# Patient Record
Sex: Female | Born: 1935
Health system: Southern US, Community
[De-identification: ages and names within clinical notes are randomized; demographics above are authoritative.]

## PROBLEM LIST (undated history)

## (undated) ENCOUNTER — Emergency Department: Admission: EM | Discharge status: Absent without leave | Payer: Self-pay

## (undated) DIAGNOSIS — M179 Osteoarthritis of knee, unspecified: Secondary | ICD-10-CM

## (undated) DIAGNOSIS — N390 Urinary tract infection, site not specified: Secondary | ICD-10-CM

## (undated) DIAGNOSIS — K219 Gastro-esophageal reflux disease without esophagitis: Secondary | ICD-10-CM

## (undated) DIAGNOSIS — Z9981 Dependence on supplemental oxygen: Secondary | ICD-10-CM

## (undated) DIAGNOSIS — G43909 Migraine, unspecified, not intractable, without status migrainosus: Secondary | ICD-10-CM

## (undated) DIAGNOSIS — M1710 Unilateral primary osteoarthritis, unspecified knee: Secondary | ICD-10-CM

## (undated) DIAGNOSIS — R0902 Hypoxemia: Secondary | ICD-10-CM

## (undated) DIAGNOSIS — R4182 Altered mental status, unspecified: Secondary | ICD-10-CM

## (undated) DIAGNOSIS — N3941 Urge incontinence: Secondary | ICD-10-CM

## (undated) DIAGNOSIS — M129 Arthropathy, unspecified: Secondary | ICD-10-CM

## (undated) DIAGNOSIS — G4733 Obstructive sleep apnea (adult) (pediatric): Secondary | ICD-10-CM

## (undated) DIAGNOSIS — F3289 Other specified depressive episodes: Secondary | ICD-10-CM

## (undated) DIAGNOSIS — E785 Hyperlipidemia, unspecified: Secondary | ICD-10-CM

## (undated) DIAGNOSIS — F329 Major depressive disorder, single episode, unspecified: Secondary | ICD-10-CM

## (undated) DIAGNOSIS — E039 Hypothyroidism, unspecified: Secondary | ICD-10-CM

## (undated) DIAGNOSIS — I509 Heart failure, unspecified: Secondary | ICD-10-CM

## (undated) DIAGNOSIS — C50919 Malignant neoplasm of unspecified site of unspecified female breast: Secondary | ICD-10-CM

## (undated) DIAGNOSIS — I10 Essential (primary) hypertension: Secondary | ICD-10-CM

## (undated) HISTORY — PX: THYROIDECTOMY: SHX17

## (undated) HISTORY — DX: Osteoarthritis of knee, unspecified: M17.9

## (undated) HISTORY — DX: Obstructive sleep apnea (adult) (pediatric): G47.33

## (undated) HISTORY — PX: TONSILLECTOMY: SUR1361

## (undated) HISTORY — PX: ABDOMINAL HYSTERECTOMY: SHX81

## (undated) HISTORY — PX: OTHER SURGICAL HISTORY: SHX169

## (undated) HISTORY — PX: ADENOIDECTOMY: SUR15

## (undated) HISTORY — DX: Other specified depressive episodes: F32.89

## (undated) HISTORY — DX: Urge incontinence: N39.41

## (undated) HISTORY — DX: Malignant neoplasm of unspecified site of unspecified female breast: C50.919

## (undated) HISTORY — DX: Gastro-esophageal reflux disease without esophagitis: K21.9

## (undated) HISTORY — PX: PARATHYROIDECTOMY: SHX19

## (undated) HISTORY — DX: Major depressive disorder, single episode, unspecified: F32.9

## (undated) HISTORY — DX: Hypothyroidism, unspecified: E03.9

## (undated) HISTORY — DX: Unilateral primary osteoarthritis, unspecified knee: M17.10

## (undated) HISTORY — DX: Migraine, unspecified, not intractable, without status migrainosus: G43.909

## (undated) HISTORY — DX: Altered mental status, unspecified: R41.82

## (undated) HISTORY — DX: Morbid (severe) obesity due to excess calories: E66.01

## (undated) HISTORY — PX: TUBAL LIGATION: SHX77

## (undated) HISTORY — PX: REPLACEMENT TOTAL KNEE BILATERAL: SUR1225

## (undated) HISTORY — DX: Arthropathy, unspecified: M12.9

## (undated) HISTORY — DX: Essential (primary) hypertension: I10

## (undated) HISTORY — DX: Hyperlipidemia, unspecified: E78.5

## (undated) HISTORY — PX: CATARACT EXTRACTION: SUR2

---

## 1993-06-19 HISTORY — PX: CHOLECYSTECTOMY: SHX55

## 1998-06-19 DIAGNOSIS — C50919 Malignant neoplasm of unspecified site of unspecified female breast: Secondary | ICD-10-CM

## 1998-06-19 HISTORY — PX: BREAST LUMPECTOMY: SHX2

## 1998-06-19 HISTORY — DX: Malignant neoplasm of unspecified site of unspecified female breast: C50.919

## 1998-06-19 HISTORY — PX: BREAST BIOPSY: SHX20

## 2006-12-22 ENCOUNTER — Emergency Department (HOSPITAL_COMMUNITY): Admission: EM | Admit: 2006-12-22 | Discharge: 2006-12-22 | Payer: Self-pay | Admitting: Family Medicine

## 2007-08-16 ENCOUNTER — Encounter: Payer: Self-pay | Admitting: Internal Medicine

## 2007-08-20 ENCOUNTER — Encounter: Payer: Self-pay | Admitting: Internal Medicine

## 2007-09-24 ENCOUNTER — Encounter: Payer: Self-pay | Admitting: Internal Medicine

## 2007-10-08 ENCOUNTER — Encounter: Payer: Self-pay | Admitting: Internal Medicine

## 2007-11-07 ENCOUNTER — Encounter: Payer: Self-pay | Admitting: Internal Medicine

## 2008-07-02 ENCOUNTER — Encounter: Payer: Self-pay | Admitting: Internal Medicine

## 2008-07-02 LAB — CONVERTED CEMR LAB
BUN: 18 mg/dL
Calcium: 8.8 mg/dL
Creatinine, Ser: 0.9 mg/dL
Glucose, Bld: 126 mg/dL
Potassium: 3.7 meq/L
Sodium: 140 meq/L

## 2008-11-06 ENCOUNTER — Encounter: Payer: Self-pay | Admitting: Internal Medicine

## 2008-11-06 LAB — CONVERTED CEMR LAB
Cholesterol: 119 mg/dL
Triglyceride fasting, serum: 75 mg/dL

## 2009-03-26 ENCOUNTER — Encounter: Payer: Self-pay | Admitting: Internal Medicine

## 2009-03-26 LAB — CONVERTED CEMR LAB
ALT: 25 units/L
AST: 33 units/L
Albumin: 3.7 g/dL
Alkaline Phosphatase: 105 units/L
BUN: 16 mg/dL
CO2: 32 meq/L
Calcium: 8.7 mg/dL
Chloride: 100 meq/L
Cholesterol: 99 mg/dL
Creatinine, Ser: 0.8 mg/dL
Glucose, Bld: 105 mg/dL
HCT: 41.9 %
HDL: 44 mg/dL
Hemoglobin: 14.5 g/dL
LDL Cholesterol: 38 mg/dL
MCV: 90.3 fL
Platelets: 150 10*3/uL
Potassium: 3.9 meq/L
RBC: 4.64 M/uL
RDW: 13.7 %
Sodium: 139 meq/L
Total Bilirubin: 1 mg/dL
Total Protein: 6.4 g/dL
Triglyceride fasting, serum: 87 mg/dL
WBC: 5.6 10*3/uL

## 2009-03-27 ENCOUNTER — Encounter: Payer: Self-pay | Admitting: Internal Medicine

## 2009-03-27 LAB — CONVERTED CEMR LAB: TSH: 2.86 microintl units/mL

## 2009-07-27 ENCOUNTER — Encounter: Payer: Self-pay | Admitting: Internal Medicine

## 2009-07-27 LAB — CONVERTED CEMR LAB
Albumin: 4 g/dL
Alkaline Phosphatase: 120 units/L
BUN: 20 mg/dL
Calcium: 8.9 mg/dL
Cholesterol: 107 mg/dL
Creatinine, Ser: 0.9 mg/dL
Glucose, Bld: 105 mg/dL
LDL Cholesterol: 43 mg/dL
Lymphocytes, automated: 25.6 %
MCV: 93.6 fL
Neutrophils Relative %: 62.5 %
Platelets: 166 10*3/uL
Potassium: 4 meq/L
RBC: 4.73 M/uL
Sodium: 140 meq/L
Total Protein: 7.4 g/dL
WBC: 6.1 10*3/uL

## 2009-07-28 ENCOUNTER — Encounter: Payer: Self-pay | Admitting: Internal Medicine

## 2009-07-28 LAB — CONVERTED CEMR LAB: TSH: 7.85 microintl units/mL

## 2009-08-05 ENCOUNTER — Encounter: Payer: Self-pay | Admitting: Internal Medicine

## 2009-08-05 DIAGNOSIS — I10 Essential (primary) hypertension: Secondary | ICD-10-CM | POA: Insufficient documentation

## 2009-08-05 DIAGNOSIS — Z853 Personal history of malignant neoplasm of breast: Secondary | ICD-10-CM | POA: Insufficient documentation

## 2009-08-05 DIAGNOSIS — Z862 Personal history of diseases of the blood and blood-forming organs and certain disorders involving the immune mechanism: Secondary | ICD-10-CM

## 2009-08-05 DIAGNOSIS — E039 Hypothyroidism, unspecified: Secondary | ICD-10-CM | POA: Insufficient documentation

## 2009-08-05 DIAGNOSIS — Z8639 Personal history of other endocrine, nutritional and metabolic disease: Secondary | ICD-10-CM | POA: Insufficient documentation

## 2009-10-04 ENCOUNTER — Ambulatory Visit: Payer: Self-pay | Admitting: Internal Medicine

## 2009-10-04 DIAGNOSIS — R32 Unspecified urinary incontinence: Secondary | ICD-10-CM | POA: Insufficient documentation

## 2009-10-04 DIAGNOSIS — Z9189 Other specified personal risk factors, not elsewhere classified: Secondary | ICD-10-CM | POA: Insufficient documentation

## 2009-10-04 DIAGNOSIS — S7290XA Unspecified fracture of unspecified femur, initial encounter for closed fracture: Secondary | ICD-10-CM | POA: Insufficient documentation

## 2009-10-26 ENCOUNTER — Telehealth: Payer: Self-pay | Admitting: Internal Medicine

## 2009-11-09 ENCOUNTER — Telehealth: Payer: Self-pay | Admitting: Internal Medicine

## 2009-12-22 ENCOUNTER — Telehealth: Payer: Self-pay | Admitting: Internal Medicine

## 2010-01-18 ENCOUNTER — Telehealth: Payer: Self-pay | Admitting: Internal Medicine

## 2010-02-08 ENCOUNTER — Ambulatory Visit: Payer: Self-pay | Admitting: Internal Medicine

## 2010-02-08 DIAGNOSIS — R5383 Other fatigue: Secondary | ICD-10-CM

## 2010-02-08 DIAGNOSIS — R5381 Other malaise: Secondary | ICD-10-CM | POA: Insufficient documentation

## 2010-02-09 LAB — CONVERTED CEMR LAB
Basophils Absolute: 0 10*3/uL (ref 0.0–0.1)
Basophils Relative: 0.2 % (ref 0.0–3.0)
Eosinophils Absolute: 0.2 10*3/uL (ref 0.0–0.7)
Lymphocytes Relative: 23.8 % (ref 12.0–46.0)
MCHC: 34.3 g/dL (ref 30.0–36.0)
MCV: 92.9 fL (ref 78.0–100.0)
Monocytes Absolute: 0.6 10*3/uL (ref 0.1–1.0)
Neutro Abs: 5.2 10*3/uL (ref 1.4–7.7)
Neutrophils Relative %: 66.2 % (ref 43.0–77.0)
Potassium: 3.8 meq/L (ref 3.5–5.1)
RBC: 4.46 M/uL (ref 3.87–5.11)
RDW: 13.7 % (ref 11.5–14.6)

## 2010-03-04 ENCOUNTER — Ambulatory Visit: Payer: Self-pay | Admitting: Internal Medicine

## 2010-03-05 DIAGNOSIS — R059 Cough, unspecified: Secondary | ICD-10-CM | POA: Insufficient documentation

## 2010-03-05 DIAGNOSIS — R05 Cough: Secondary | ICD-10-CM

## 2010-03-19 ENCOUNTER — Ambulatory Visit: Payer: Self-pay | Admitting: Internal Medicine

## 2010-03-20 ENCOUNTER — Inpatient Hospital Stay (HOSPITAL_COMMUNITY): Admission: EM | Admit: 2010-03-20 | Discharge: 2010-03-22 | Payer: Self-pay | Admitting: Emergency Medicine

## 2010-03-23 ENCOUNTER — Telehealth: Payer: Self-pay | Admitting: Cardiovascular Disease

## 2010-05-06 ENCOUNTER — Emergency Department (HOSPITAL_COMMUNITY): Admission: EM | Admit: 2010-05-06 | Discharge: 2010-05-07 | Payer: Self-pay | Admitting: Emergency Medicine

## 2010-05-10 ENCOUNTER — Encounter (INDEPENDENT_AMBULATORY_CARE_PROVIDER_SITE_OTHER): Payer: Self-pay | Admitting: *Deleted

## 2010-05-10 ENCOUNTER — Ambulatory Visit: Payer: Self-pay | Admitting: Internal Medicine

## 2010-05-10 ENCOUNTER — Encounter: Payer: Self-pay | Admitting: Gastroenterology

## 2010-05-10 DIAGNOSIS — R110 Nausea: Secondary | ICD-10-CM | POA: Insufficient documentation

## 2010-05-10 DIAGNOSIS — F341 Dysthymic disorder: Secondary | ICD-10-CM | POA: Insufficient documentation

## 2010-05-19 ENCOUNTER — Ambulatory Visit: Payer: Self-pay | Admitting: Licensed Clinical Social Worker

## 2010-05-24 ENCOUNTER — Ambulatory Visit: Payer: Self-pay | Admitting: Internal Medicine

## 2010-05-31 ENCOUNTER — Ambulatory Visit: Payer: Self-pay | Admitting: Licensed Clinical Social Worker

## 2010-06-21 ENCOUNTER — Telehealth: Payer: Self-pay | Admitting: Internal Medicine

## 2010-06-23 ENCOUNTER — Ambulatory Visit
Admission: RE | Admit: 2010-06-23 | Discharge: 2010-06-23 | Payer: Self-pay | Source: Home / Self Care | Attending: Licensed Clinical Social Worker | Admitting: Licensed Clinical Social Worker

## 2010-06-29 ENCOUNTER — Ambulatory Visit: Admit: 2010-06-29 | Payer: Self-pay | Admitting: Gastroenterology

## 2010-06-30 ENCOUNTER — Encounter
Admission: RE | Admit: 2010-06-30 | Discharge: 2010-07-19 | Payer: Self-pay | Source: Home / Self Care | Attending: Internal Medicine | Admitting: Internal Medicine

## 2010-06-30 ENCOUNTER — Encounter: Payer: Self-pay | Admitting: Internal Medicine

## 2010-07-07 ENCOUNTER — Ambulatory Visit
Admission: RE | Admit: 2010-07-07 | Discharge: 2010-07-07 | Payer: Self-pay | Source: Home / Self Care | Attending: Licensed Clinical Social Worker | Admitting: Licensed Clinical Social Worker

## 2010-07-19 ENCOUNTER — Ambulatory Visit: Admit: 2010-07-19 | Payer: Self-pay | Admitting: Licensed Clinical Social Worker

## 2010-07-20 NOTE — Assessment & Plan Note (Signed)
Summary: 4-6 mos f/u / #? cd   Vital Signs:  Patient profile:   77 year old female Height:      64.5 inches (163.83 cm) Weight:      270.8 pounds (123.09 kg) O2 Sat:      96 % on Room air Temp:     97.6 degrees F (36.44 degrees C) oral Pulse rate:   91 / minute BP sitting:   110 / 68  (left arm) Cuff size:   large  Vitals Entered By: Tomma Lightning RMA (February 08, 2010 11:06 AM)  O2 Flow:  Room air CC: 4 month follow-up Is Patient Diabetic? No Pain Assessment Patient in pain? no        Primary Care Provider:  Rowe Clack MD  CC:  4 month follow-up.  History of Present Illness: here for followup  1) hypothyroid - related to surg resection for goiter when hyperparathyroid glands (3 of 4) removed - no hx thyroid cancer - levels and dose fluctuating recently - feels fatigued and c/o brittle nails so concerned level is off - chronic weight gain - no bowel changes  2) HTN - reports compliance with ongoing medical treatment and no changes in medication dose or frequency. denies adverse side effects related to current therapy. no CP, edema or HA - no hx CVA or CAD - if all meds needed?  3) dyslipidemia - prev on lipitor but stopped 07/2009 (pt unclear as to why) - takes fish oil - denies muscle weakenss or GI upset -  4) GERD with reflux - reports compliance with ongoing medical treatment and no changes in medication dose or frequency. denies adverse side effects related to current therapy.  ?change to more affordable PPI - generic omeprazole ineffective  5) OA, B knees - no other joints affected - s/p TKR for each - keeps active with walking - no chronic pain meds needed   Current Medications (verified): 1)  Klor-Con 10 10 Meq Cr-Tabs (Potassium Chloride) .... Take 2 Q Am, 1 At [facility] and 1 in [facility] 2)  Ambien 10 Mg Tabs (Zolpidem Tartrate) .... Take 1 At Bedtime 3)  Hydrochlorothiazide 25 Mg Tabs (Hydrochlorothiazide) .... Take 1 By Mouth Qd 4)  Nexium 40 Mg Cpdr  (Esomeprazole Magnesium) .... Take 1 By Mouth Qd 5)  Diovan 160 Mg Tabs (Valsartan) .... Take 1 By Mouth Qd 6)  Aspirin 81 Mg Tabs (Aspirin) .... Once Daily 7)  Vitamin D 2000 Unit Caps (Cholecalciferol) .... Once Daily 8)  Calcium 500/vitamin D 500-125 Mg-Unit Tabs (Calcium Carbonate-Vitamin D) .... Take 1 Two Times A Day 9)  Multivitamins  Tabs (Multiple Vitamin) .... Once Daily 10)  Fish Oil 1000 Mg Caps (Omega-3 Fatty Acids) .... Take 2 By Mouth Once Daily 11)  Fluconazole 150 Mg Tabs (Fluconazole) .... Take As Directed 12)  Synthroid 137 Mcg Tabs (Levothyroxine Sodium) .... Take 1 By Mouth Once Daily 13)  Align  Caps (Probiotic Product) .... Take 1 By Mouth Once Daily  Allergies (verified): 1)  ! Codeine 2)  ! Darvon 3)  ! Epinephrine 4)  ! Beta Blockers  Past History:  Past Medical History: GERD Hyperlipidemia Hypertension Hypothyroidism Breast cancer, hx  - R lumpectomy and XRT- 2000 Depression B knee osteoarthritis  MD roster: gyn -  Review of Systems       The patient complains of incontinence.  The patient denies fever, weight loss, syncope, and headaches.    Physical Exam  General:  overweight-appearing.  alert, well-developed, well-nourished, and cooperative to examination.    Lungs:  normal respiratory effort, no intercostal retractions or use of accessory muscles; normal breath sounds bilaterally - no crackles and no wheezes.    Heart:  normal rate, regular rhythm, no murmur, and no rub. BLE without edema.  mild distal varicosities BLE Psych:  Oriented X3, memory intact for recent and remote, normally interactive, good eye contact, not anxious appearing, not depressed appearing, and not agitated.      Impression & Recommendations:  Problem # 1:  FATIGUE (ICD-780.79)  Orders: TLB-CBC Platelet - w/Differential (85025-CBCD) TLB-TSH (Thyroid Stimulating Hormone) (84443-TSH)  Problem # 2:  UNSPECIFIED URINARY INCONTINENCE (ICD-788.30) [place] "no surgical options to help me" per pt (no records available to me) reports prior trials of meds x 2 ineffective - caused severe orthostatic dizziness - doesn't want any meds- would be interested in gyn eval for second opinion of same now that in [place] area - referral done Orders: Gynecologic Referral (Gyn)  Problem # 3:  HYPERTENSION (ICD-401.9)  low normal BP - ok to stop HCTZ and Kcl so long as BP <135/85 Her updated medication list for this problem includes:    Hydrochlorothiazide 25 Mg Tabs (Hydrochlorothiazide) ..... Hold    Diovan 160 Mg Tabs (Valsartan) .[name]... Take 1 by mouth qd  Orders: TLB-Potassium (K+) (84132-K)  BP today: 110/68 Prior BP: 130/82 (10/04/2009)  Labs Reviewed: K+: 4.0 (07/27/2009) Creat: : 0.9 (07/27/2009)   Chol: 107 (07/27/2009)   HDL: 51 (07/27/2009)   LDL: 43 (07/27/2009)   TG: 65 (07/27/2009)  Problem # 4:  GERD (ICD-530.81)  change nexium to generic protonix for ?cost savings - prev ineffective control with omeprazole Her updated medication list for this problem includes:    Protonix 20 Mg Tbec (Pantoprazole sodium) .[name]... 1 by mouth once daily  Orders: Prescription Created Electronically [phone])  EGD: Done @ [facility] by Dr. Shirlyn Goltz  Impression: Irregular Z line. GE junction biopsies shoewed mild chronic inflammation. No intestinal metaplasia seen. Two 97mm stomach polyp (09/24/2007)  Labs Reviewed: Hgb: 15.2 (07/27/2009)   Hct: 44.3 (07/27/2009)  Problem # 5:  HYPERLIPIDEMIA (ICD-272.4)  off Lipitor by prior PCP 07/2009 -  presume dut to excellent counts and no other risk factors for secondary prevention needs should cont fish oil -  recheck 4-49mo FLP and LFTs  Labs Reviewed: SGOT: 99 (07/27/2009)   SGPT: 11 (07/27/2009)   HDL:51 (07/27/2009), 44 (03/26/2009)  LDL:43 (07/27/2009), 38 (03/26/2009)  Chol:107 (07/27/2009), 99 (03/26/2009)  Trig:65 (07/27/2009), 87 (03/26/2009)  Complete Medication List: 1)   Klor-con 10 10 Meq Cr-tabs (Potassium chloride) .... Hold 2)  Ambien 10 Mg Tabs (Zolpidem tartrate) .... Take 1 at bedtime 3)  Hydrochlorothiazide 25 Mg Tabs (Hydrochlorothiazide) .... Hold 4)  Protonix 20 Mg Tbec (Pantoprazole sodium) .[name].. 1 by mouth once daily 5)  Diovan 160 Mg Tabs (Valsartan) .... Take 1 by mouth qd 6)  Aspirin 81 Mg Tabs (Aspirin) .... Once daily 7)  Vitamin D 2000 Unit Caps (Cholecalciferol) .... Once daily 8)  Calcium 500/vitamin D 500-125 Mg-unit Tabs (Calcium carbonate-vitamin d) .... Take 1 two times a day 9)  Multivitamins Tabs (Multiple vitamin) .... Once daily 10)  Fish Oil 1000 Mg Caps (Omega-3 fatty acids) .... Take 2 by mouth once daily 11)  Fluconazole 150 Mg Tabs (Fluconazole) .... Take as directed 12)  Synthroid 137 Mcg Tabs (Levothyroxine sodium) .... Take 1 by mouth once daily 13)  Align Caps (Probiotic  product) .... Take 1 by mouth once daily  Patient Instructions: 1)  it was good to see you today. 2)  stop HCTZ and potassium at this time as discussed - check BP weekly and resume if BP>130/85 3)  change nexium to generic protonix for your reflux/stomach -your prescription has been electronically submitted to your pharmacy. Please take as directed. Contact our office if you believe you're having problems with the medication(s).  4)  test(s) ordered today - your results will be posted on the phone tree for review in 48-72 hours from the time of test completion; call [phone] and enter your 9 digit MRN (listed above on this page, just below your name); if any changes need to be made or there are abnormal results, you will be contacted directly. 5)  we'll make referral to gynecology for your bladder. Our office will contact you regarding this appointment once made.  6)  Please schedule a follow-up appointment in 07/2010, call sooner if problems.  will check all labs at that visit so come in fasting Prescriptions: PROTONIX 20 MG TBEC (PANTOPRAZOLE SODIUM) 1  by mouth once daily  #30 x 6   Entered and Authorized by:   Rowe Clack MD   Signed by:   Rowe Clack MD on 02/08/2010   Method used:   Electronically to        [id number] (retail)       [address]       [place], [place]  [address]       Ph: [phone] or [phone]       Fax: [phone]   RxID:   [id number]

## 2010-07-20 NOTE — Procedures (Signed)
Summary: EGD/[facility]  EGD/[facility]   Imported By: Phillis Knack 10/06/2009 09:38:21  _____________________________________________________________________  External Attachment:    Type:   Image     Comment:   External Document

## 2010-07-20 NOTE — Assessment & Plan Note (Signed)
Summary: 2 week follow up-lb   Vital Signs:  Patient profile:   77 year old female Height:      64.5 inches (163.83 cm) Weight:      275.4 pounds (125.18 kg) O2 Sat:      95 % on Room air Temp:     97.3 degrees F (36.28 degrees C) oral Pulse rate:   82 / minute BP sitting:   102 / 82  (left arm) Cuff size:   large  Vitals Entered By: Tomma Lightning RMA (May 24, 2010 10:56 AM)  O2 Flow:  Room air CC: 2 week follow-up Is Patient Diabetic? No Pain Assessment Patient in pain? no        Primary Care Provider:  Rowe Clack MD  CC:  2 week follow-up.  History of Present Illness: HTN f/u - meds changed 2 weeks ago after ER and OV for same -   also review other chronic issues: 1) hypothyroid - related to surg resection for goiter when hyperparathyroid glands (3 of 4) removed - no hx thyroid cancer - levels and dose fluctuating recently - feels fatigued and c/o brittle nails so concerned level is off - chronic weight gain - no bowel changes  2) HTN - see above- reports compliance with ongoing medical treatment and no changes in medication dose or frequency. denies adverse side effects related to current therapy. no CP, edema or HA - no hx CVA or CAD (neg cath 03/2010 hosp for CP)  3) dyslipidemia - prev on lipitor but stopped 07/2009 (pt unclear as to why) - takes fish oil - denies muscle weakenss or GI upset -  4) GERD with reflux - reports compliance with ongoing medical treatment and recent change in medication from omep to pantopr. denies adverse side effects related to current therapy.  pending GI appt for same  5) OA, B knees - no other joints affected - s/p TKR for each - keeps active with walking - no chronic pain meds needed  6) depression - has begun seeing susan bond due to life stressors - feels improved - wishes to avoid meds if poss -   7) obesity - fustrated with weight gain since spring 2011 - cut cal but would like more diet help   Clinical Review  Panels:  Lipid Management   Cholesterol:  107 (07/27/2009)   LDL (bad choesterol):  43 (07/27/2009)   HDL (good cholesterol):  51 (07/27/2009)   Triglycerides:  65 (07/27/2009)  CBC   WBC:  7.8 (02/08/2010)   RBC:  4.46 (02/08/2010)   Hgb:  14.2 (02/08/2010)   Hct:  41.4 (02/08/2010)   Platelets:  172.0 (02/08/2010)   MCV  92.9 (02/08/2010)   MCHC  34.3 (02/08/2010)   RDW  13.7 (02/08/2010)   PMN:  66.2 (02/08/2010)   Lymphs:  23.8 (02/08/2010)   Monos:  7.6 (02/08/2010)   Eosinophils:  2.2 (02/08/2010)   Basophil:  0.2 (02/08/2010)  Complete Metabolic Panel   Glucose:  105 (07/27/2009)   Sodium:  140 (07/27/2009)   Potassium:  3.8 (02/08/2010)   Chloride:  100 (07/27/2009)   CO2:  33 (07/27/2009)   BUN:  20 (07/27/2009)   Creatinine:  0.9 (07/27/2009)   Albumin:  4.0 (07/27/2009)   Total Protein:  7.4 (07/27/2009)   Calcium:  8.9 (07/27/2009)   Total Bili:  1.1 (07/27/2009)   Alk Phos:  120 (07/27/2009)   SGPT (ALT):  11 (07/27/2009)   SGOT (AST):  99 (07/27/2009)  Current Medications (verified): 1)  Klor-Con 10 10 Meq Cr-Tabs (Potassium Chloride) .... Take 2 By Mouth Once Daily 2)  Ambien 10 Mg Tabs (Zolpidem Tartrate) .... Take 1 At Bedtime 3)  Protonix 20 Mg Tbec (Pantoprazole Sodium) .[name].. 1 By Mouth Once Daily 4)  Aspirin 81 Mg Tabs (Aspirin) .... Once Daily 5)  Vitamin D 2000 Unit Caps (Cholecalciferol) .... Once Daily 6)  Multivitamins  Tabs (Multiple Vitamin) .... Once Daily 7)  Fish Oil 1000 Mg Caps (Omega-3 Fatty Acids) .... Take 2 By Mouth Once Daily 8)  Fluconazole 150 Mg Tabs (Fluconazole) .... Take As Directed 9)  Synthroid 137 Mcg Tabs (Levothyroxine Sodium) .... Take 1 By Mouth Once Daily 10)  Loratadine 10 Mg Tabs (Loratadine) .[name].. 1 By Mouth Once Daily X 5 Days, Then As Needed For Runny Nose/sneeze/congestion 11)  Mucinex Dm 30-600 Mg Xr12h-Tab (Dextromethorphan-Guaifenesin) .[name].. 1 By Mouth Two Times A Day X 5 Days, Then As Needed For Cough 12)   Fiber Caps .... 2 By Mouth Once Daily 13)  Diovan Hct 320-12.5 Mg Tabs (Valsartan-Hydrochlorothiazide) .[name].. 1 By Mouth Qam For Blood Pressure  Allergies (verified): 1)  ! Codeine 2)  ! Darvon 3)  ! Epinephrine 4)  ! Beta Blockers 5)  Cipro  Past History:  Past Medical History: GERD Hyperlipidemia  Hypertension Hypothyroidism Breast cancer, hx  - R lumpectomy and XRT- 2000 Depression B knee osteoarthritis    MD roster: gyn - card - nishan gi -  Review of Systems  The patient denies fever, chest pain, syncope, dyspnea on exertion, and headaches.    Physical Exam  General:  overweight-appearing.  alert, well-developed, well-nourished, and cooperative to examination.   spouse at side Lungs:  normal respiratory effort, no intercostal retractions or use of accessory muscles; normal breath sounds bilaterally - no crackles and no wheezes.    Heart:  normal rate, regular rhythm, no murmur, and no rub. BLE without edema.  mild distal varicosities BLE Psych:  Oriented X3, memory intact for recent and remote, normally interactive, good eye contact, not anxious appearing, not depressed appearing, and not agitated.   not suicidal and not homicidal.     Impression & Recommendations:  Problem # 1:  HYPERTENSION (ICD-401.9)  Her updated medication list for this problem includes:    Diovan Hct 320-12.5 Mg Tabs (Valsartan-hydrochlorothiazide) .[name]... 1 by mouth qam for blood pressure  BP today: 102/82 Prior BP: 138/90 (05/10/2010)  Labs Reviewed: K+: 3.8 (02/08/2010) Creat: : 0.9 (07/27/2009)   Chol: 107 (07/27/2009)   HDL: 51 (07/27/2009)   LDL: 43 (07/27/2009)   TG: 65 (07/27/2009)  Problem # 2:  DEPRESSION/ANXIETY (ICD-300.4) imprpved = cont behav health counseling, no meds for now  Problem # 3:  MORBID OBESITY (ICD-278.01)  Orders: Nutrition Referral (Nutrition)  also advised to work on portion control and inc walking or other exercise to control weight  Ht: 64.5  (10/04/2009)   Wt: 264.4 (10/04/2009)   BMI: 44.84 (10/04/2009)  Ht: 64.5 (05/24/2010)   Wt: 275.4 (05/24/2010)   BMI: 45.62 (05/10/2010)  Problem # 4:  HYPOTHYROIDISM (ICD-244.9)  Her updated medication list for this problem includes:    Synthroid 137 Mcg Tabs (Levothyroxine sodium) .[name]... Take 1 by mouth once daily  Labs Reviewed: TSH: 1.34 (02/08/2010)    Chol: 107 (07/27/2009)   HDL: 51 (07/27/2009)   LDL: 43 (07/27/2009)   TG: 65 (07/27/2009)  Problem # 5:  GERD (ICD-530.81)  Her updated medication list for this problem includes:  Protonix 20 Mg Tbec (Pantoprazole sodium) .[name]... 1 by mouth once daily  EGD: Done @ [facility] by Dr. Shirlyn Goltz  Impression: Irregular Z line. GE junction biopsies shoewed mild chronic inflammation. No intestinal metaplasia seen. Two 30mm stomach polyp (09/24/2007)  Labs Reviewed: Hgb: 14.2 (02/08/2010)   Hct: 41.4 (02/08/2010)  Time spent with patient 25 minutes, more than 50% of this time was spent counseling patient on depression and medications for HTN and heart healthy diet for weight loss  Complete Medication List: 1)  Klor-con 10 10 Meq Cr-tabs (Potassium chloride) .... Take 2 by mouth once daily 2)  Ambien 10 Mg Tabs (Zolpidem tartrate) .... Take 1 at bedtime 3)  Protonix 20 Mg Tbec (Pantoprazole sodium) .[name].. 1 by mouth once daily 4)  Aspirin 81 Mg Tabs (Aspirin) .... Once daily 5)  Vitamin D 2000 Unit Caps (Cholecalciferol) .... Once daily 6)  Multivitamins Tabs (Multiple vitamin) .... Once daily 7)  Fish Oil 1000 Mg Caps (Omega-3 fatty acids) .... Take 2 by mouth once daily 8)  Fluconazole 150 Mg Tabs (Fluconazole) .... Take as directed 9)  Synthroid 137 Mcg Tabs (Levothyroxine sodium) .... Take 1 by mouth once daily 10)  Loratadine 10 Mg Tabs (Loratadine) .[name].. 1 by mouth once daily x 5 days, then as needed for runny nose/sneeze/congestion 11)  Mucinex Dm 30-600 Mg Xr12h-tab (Dextromethorphan-guaifenesin) .[name].. 1 by mouth two  times a day x 5 days, then as needed for cough 12)  Fiber Caps  .... 2 by mouth once daily 13)  Diovan Hct 320-12.5 Mg Tabs (Valsartan-hydrochlorothiazide) .[name].. 1 by mouth qam for blood pressure  Patient Instructions: 1)  it was good to see you today. 2)  no medications changes for you 3)  we'll make referral to nutrition and weight loss diet. Our office will contact you regarding this appointment once made.  4)  Please schedule a follow-up appointment in 3 months to monitor blood pressure and weight, call sooner if problems.    Orders Added: 1)  Nutrition Referral [Nutrition] 2)  Est. Patient Level IV [29528]

## 2010-07-20 NOTE — Progress Notes (Signed)
Summary: pt arm bruising  Phone Note Call from Patient Call back at [phone]   Caller: Patient Reason for Call: Talk to Nurse, Talk to Doctor Summary of Call: pt had radial cardiac cath on Monday and her arm is swelling and bruising not warm to the touch and she wants to know what she needs to do  Initial call taken by: Shelda Pal,  March 23, 2010 1:54 PM  Follow-up for Phone Call        Attempted to call patient x4 at call back number and home number. Both were disconnected numbers. Will recheck number with schedulers. Horton Chin RN Mrs. Deane calls today with bruising to right and swelling.  She states bruising was present upon discharge on 10/4.  There was no swelling until today.  She has elevated her arm and the swelling is decreased.  She denies numbness, tingling, pain, coolness to touch of the arm below the cath site. Discussed limited use of the arm for 1 week per discharge instructions.  She will also use warm/cold compress to arm for the swelling.  She is not having any pain at this time.  She did mention some nausea after eating lunch today.  She will call back if swelling does not subside or symptoms occur. Horton Chin RN     Appended Document: pt arm bruising Have patient come to office this week for duplex of radial cath site with Missy  Appended Document: pt arm bruising unable to reach pt or leave a message    Appended Document: pt arm bruising UNABLE TO REACH PT./CY  Appended Document: pt arm bruising unable to reach pt will await return phone call.

## 2010-07-20 NOTE — Progress Notes (Signed)
Summary: zolpidem  Phone Note Refill Request Message from:  Fax from Pharmacy on December 22, 2009 1:45 PM  Refills Requested: Medication #1:  AMBIEN 10 MG TABS take 1 at bedtime # 90   Last Ref[facility]: [facility] [phone] Last ov 10/04/09  Next Appointment Scheduled: 02/08/10 Initial call taken by: Tomma Lightning,  December 22, 2009 1:46 PM  Follow-up for Phone Call        Is this ok to refill? Follow-up by: Tomma Lightning,  December 22, 2009 1:46 PM  Additional Follow-up for Phone Call Additional follow up Details #1::        yes #90 with 1 refill Additional Follow-up by: Rowe Clack MD,  December 22, 2009 2:03 PM    Additional Follow-up for Phone Call Additional follow up Details #2::    [facility] with 1 addtional refill Follow-up by: Tomma Lightning,  December 22, 2009 2:50 PM  Prescriptions: AMBIEN 10 MG TABS (ZOLPIDEM TARTRATE) take 1 at bedtime  #90 x 1   Entered by:   Tomma Lightning   Authorized by:   Rowe Clack MD   Signed by:   Tomma Lightning on 12/22/2009   Method used:   Telephoned to ...       [facility] [address] [id number] (retail)       [address]       [place], [place]  [address]       Ph: [phone] or [phone]       Fax: [phone]   RxID:   [id number]

## 2010-07-20 NOTE — Letter (Signed)
Summary: [facility]   Imported By: Phillis Knack 10/06/2009 09:35:18  _____________________________________________________________________  External Attachment:    Type:   Image     Comment:   External Document

## 2010-07-20 NOTE — Letter (Signed)
Summary: New Patient letter  [facility]  [address] [place], [place] [address]   Phone: [phone]  Fax: [phone]       05/10/2010 MRN: [id number]  [facility] [place], [place]  [address]  Dear Tamara Morales,  Welcome to the Gastroenterology Division at [facility].    You are scheduled to see Dr.  Fuller Plan on 06-29-2010 at 10am on the 3rd floor at [facility] [place] [employer].  We ask that you try to arrive at our office 15 minutes prior to your appointment time to allow for check-in.  We would like you to complete the enclosed self-administered evaluation form prior to your visit and bring it with you on the day of your appointment.  We will review it with you.  Also, please bring a complete list of all your medications or, if you prefer, bring the medication bottles and we will list them.  Please bring your insurance card so that we may make a copy of it.  If your insurance requires a referral to see a specialist, please bring your referral form from your primary care physician.  Co-payments are due at the time of your visit and may be paid by cash, check or credit card.     Your office visit will consist of a consult with your physician (includes a physical exam), any laboratory testing he/she may order, scheduling of any necessary diagnostic testing (e.g. x-ray, ultrasound, CT-scan), and scheduling of a procedure (e.g. Endoscopy, Colonoscopy) if required.  Please allow enough time on your schedule to allow for any/all of these possibilities.    If you cannot keep your appointment, please call [phone] to cancel or reschedule prior to your appointment date.  This allows [place] the opportunity to schedule an appointment for another patient in need of care.  If you do not cancel or reschedule by 5 p.m. the business day prior to your appointment date, you will be charged a $50.00 late cancellation/no-show fee.    Thank you for  choosing [facility] for your medical needs.  We appreciate the opportunity to care for you.  Please visit [place] at our website  to learn more about our practice.                     Sincerely,                                                             The Gastroenterology Division

## 2010-07-20 NOTE — Assessment & Plan Note (Signed)
Summary: NEW PT / MEDICARE Tamara Morales  #   RS'D PER PT/NWS   Vital Signs:  Patient profile:   77 year old female Height:      64.5 inches (163.83 cm) Weight:      264.4 pounds (120.18 kg) BMI:     44.84 O2 Sat:      96 % on Room air Temp:     98.2 degrees F (36.78 degrees C) oral Pulse rate:   84 / minute BP sitting:   130 / 82  (left arm) Cuff size:   large  Vitals Entered By: Tomma Lightning (October 04, 2009 8:13 AM)  O2 Flow:  Room air CC: New patient Is Patient Diabetic? No Pain Assessment Patient in pain? no        Primary Care Provider:  Rowe Clack MD  CC:  New patient.  History of Present Illness: new pt to me and our division - here today to est care - recent move to [place] from [place] (08/2009)  1) hypothyroid - related to surg resection for goiter when hyperparathyroid glands (3 of 4) removed - no hx thyroid cancer - levels and dose fluctuuating recently - feels fatigues and c/o brittle nails so concerne level is off - chronic weight gain - no bowel changes  2) HTN - reports compliance with ongoing medical treatment and no changes in medication dose or frequency. denies adverse side effects related to current therapy. no CP, edema or HA - no hx CVA or CAD  3) dyslipidemia - prev on lipitor but stopped 07/2009 (pt unclear as to why) - takes fish oil - denies muscle weakenss or GI upset -  4) GERD with reflux - reports compliance with ongoing medical treatment and no changes in medication dose or frequency. denies adverse side effects related to current therapy.   5) OA, B knees - no other joints affected - s/p TKR fopr each - keeps active with walking - no chronc pain meds needed   Preventive Screening-Counseling & Management  Alcohol-Tobacco     Alcohol drinks/day: <1     Alcohol Counseling: not indicated; use of alcohol is not excessive or problematic     Smoking Status: never     Tobacco Counseling: not indicated; no tobacco use  Caffeine-Diet-Exercise   Does Patient Exercise: no     Exercise Counseling: to improve exercise regimen     Depression Counseling: not indicated; screening negative for depression  Safety-Violence-Falls     Seat Belt Use: yes     Helmet Use: n/a     Firearms in the Home: no firearms in the home     Smoke Detectors: yes     Violence in the Home: no risk noted     Fall Risk Counseling: not indicated; no significant falls noted  Clinical Review Panels:  Immunizations   Last Tetanus Booster:  Td (10/04/2009)   Last Flu Vaccine:  Historical (03/19/2009)   Last Pneumovax:  Historical (06/19/2006)  Lipid Management   Cholesterol:  99 (03/26/2009)   LDL (bad choesterol):  38 (03/26/2009)   HDL (good cholesterol):  44 (03/26/2009)   Triglycerides:  87 (03/26/2009)  CBC   WBC:  5.6 (03/26/2009)   RBC:  4.64 (03/26/2009)   Hgb:  14.5 (03/26/2009)   Hct:  41.9 (03/26/2009)   Platelets:  150 (03/26/2009)   MCV  90.3 (03/26/2009)   RDW  13.7 (03/26/2009)  Complete Metabolic Panel   Glucose:  105 (03/26/2009)   Sodium:  139 (  03/26/2009)   Potassium:  3.9 (03/26/2009)   Chloride:  100 (03/26/2009)   CO2:  32 (03/26/2009)   BUN:  16 (03/26/2009)   Creatinine:  0.8 (03/26/2009)   Albumin:  3.7 (03/26/2009)   Total Protein:  6.4 (03/26/2009)   Calcium:  8.7 (03/26/2009)   Total Bili:  1.0 (03/26/2009)   Alk Phos:  105 (03/26/2009)   SGPT (ALT):  25 (03/26/2009)   SGOT (AST):  33 (03/26/2009)   Current Medications (verified): 1)  Synthroid 125 Mcg Tabs (Levothyroxine Sodium) .... Take 1 By Mouth Qd 2)  Klor-Con 10 10 Meq Cr-Tabs (Potassium Chloride) .... Take 2 Q Am, 1 At [facility] and 1 in [facility] 3)  Ambien 10 Mg Tabs (Zolpidem Tartrate) .... Take 1 At Bedtime 4)  Hydrochlorothiazide 25 Mg Tabs (Hydrochlorothiazide) .... Take 1 By Mouth Qd 5)  Nexium 40 Mg Cpdr (Esomeprazole Magnesium) .... Take 1 By Mouth Qd 6)  Diovan 160 Mg Tabs (Valsartan) .... Take 1 By Mouth Qd 7)  Aspirin 81 Mg Tabs (Aspirin) ....  Once Daily 8)  Vitamin D 2000 Unit Caps (Cholecalciferol) .... Once Daily 9)  Calcium 500/vitamin D 500-125 Mg-Unit Tabs (Calcium Carbonate-Vitamin D) .... Take 1 Two Times A Day 10)  Multivitamins  Tabs (Multiple Vitamin) .... Once Daily 11)  Fish Oil 1000 Mg Caps (Omega-3 Fatty Acids) .... Once Daily 12)  Fluconazole 150 Mg Tabs (Fluconazole) .... Take As Directed  Allergies (verified): 1)  ! Codeine 2)  ! Darvon 3)  ! Epinephrine 4)  ! Beta Blockers  Past History:  Past Medical History: GERD Hyperlipidemia Hypertension Hypothyroidism Breast cancer, hx  - R lumpectomy and XRT- 2000 Depression B knee osteoarthritis  MD rooster:  Past Surgical History: Cholecystectomy (1995) Lumpectomy (2000) Thyroidectomy Knee surgery, bilateral knee replacement 1999 & 2005 Breast biopsy (2000)  Family History: Father deceased had heart disease Mother deceased No significant for colon cancer Family History of Arthritis (parent)  Social History: Never Smoked rare alcohol married, lives with spouse at [facility] since 08/2009 Smoking Status:  never Does Patient Exercise:  no Seat Belt Use:  yes  Review of Systems       see HPI above. I have reviewed all other systems and they were negative.   Physical Exam  General:  overweight-appearing.  alert, well-developed, well-nourished, and cooperative to examination.    Eyes:  vision grossly intact; pupils equal, round and reactive to light.  conjunctiva and lids normal.    Ears:  normal pinnae bilaterally, without erythema, swelling, or tenderness to palpation. TMs clear, without effusion, or cerumen impaction. Hearing grossly normal bilaterally  Mouth:  teeth and gums in good repair; mucous membranes moist, without lesions or ulcers. oropharynx clear without exudate, no erythema.  Neck:  supple, full ROM, no masses, no thyromegaly; no thyroid nodules or tenderness. no JVD or carotid bruits.   Lungs:  normal respiratory effort,  no intercostal retractions or use of accessory muscles; normal breath sounds bilaterally - no crackles and no wheezes.    Heart:  normal rate, regular rhythm, no murmur, and no rub. BLE without edema. normal DP pulses and normal cap refill in all 4 extremities   mild distal varicosities BLE Abdomen:  obese, soft, non-tender, normal bowel sounds, no distention; no masses and no appreciable hepatomegaly or splenomegaly.   Msk:  s/p B TKR - FROM, no appreciable effusions Neurologic:  alert & oriented X3 and cranial nerves II-XII symetrically intact.  strength normal in all extremities,  sensation intact to light touch, and gait normal. speech fluent without dysarthria or aphasia; follows commands with good comprehension.  Skin:  no rashes, vesicles, ulcers, or erythema. No nodules or irregularity to palpation.  Psych:  Oriented X3, memory intact for recent and remote, normally interactive, good eye contact, not anxious appearing, not depressed appearing, and not agitated.      Impression & Recommendations:  Problem # 1:  HYPOTHYROIDISM (ICD-244.9)  Her updated medication list for this problem includes:    Synthroid 125 Mcg Tabs (Levothyroxine sodium) .[name]... Take 1 by mouth qd  Orders: TLB-TSH (Thyroid Stimulating Hormone) (84443-TSH) T-Bone Densitometry [phone]) T-Lumbar Vertebral Assessment (20947)  Labs Reviewed: TSH: 2.860 (03/27/2009)    Chol: 99 (03/26/2009)   HDL: 44 (03/26/2009)   LDL: 38 (03/26/2009)   TG: 87 (03/26/2009)  Problem # 2:  ARTHRITIS, KNEES, BILATERAL (ICD-716.98)  Orders: T-Bone Densitometry (09628) T-Lumbar Vertebral Assessment (36629)  Problem # 3:  HYPERTENSION (ICD-401.9)  Her updated medication list for this problem includes:    Hydrochlorothiazide 25 Mg Tabs (Hydrochlorothiazide) .[name]... Take 1 by mouth qd    Diovan 160 Mg Tabs (Valsartan) .[name]... Take 1 by mouth qd  BP today: 130/82  Labs Reviewed: K+: 3.9 (03/26/2009) Creat: : 0.8 (03/26/2009)   Chol: 99  (03/26/2009)   HDL: 44 (03/26/2009)   LDL: 38 (03/26/2009)   TG: 87 (03/26/2009)  Problem # 4:  HYPERLIPIDEMIA (ICD-272.4)  off Lipitor by prior PCP 07/2009 - will send for records re: why - to cont fish oil -  recheck 4-71mo FLP and LFTs The following medications were removed from the medication list:    Lipitor 20 Mg Tabs (Atorvastatin calcium) .[name]... Take 1 by mouth qd  Labs Reviewed: SGOT: 33 (03/26/2009)   SGPT: 25 (03/26/2009)   HDL:44 (03/26/2009), 47 (11/06/2008)  LDL:38 (03/26/2009), 57 (11/06/2008)  Chol:99 (03/26/2009), 119 (11/06/2008)  Trig:87 (03/26/2009), 75 (11/06/2008)  Problem # 5:  MORBID OBESITY (ICD-278.01) advisedd to work onp[ortion control and inc walking or other exercise to control weight Orders: T-Bone Densitometry (47654) T-Lumbar Vertebral Assessment (65035)  Ht: 64.5 (10/04/2009)   Wt: 264.4 (10/04/2009)   BMI: 44.84 (10/04/2009)  Problem # 6:  GERD (ICD-530.81)  Her updated medication list for this problem includes:    Nexium 40 Mg Cpdr (Esomeprazole magnesium) .[name]... Take 1 by mouth qd  EGD: Done @ [facility] by Dr. Shirlyn Goltz  Impression: Irregular Z line. GE junction biopsies shoewed mild chronic inflammation. No intestinal metaplasia seen. Two 9mm stomach polyp (09/24/2007)  Labs Reviewed: Hgb: 14.5 (03/26/2009)   Hct: 41.9 (03/26/2009)  Problem # 7:  DEPRESSION (ICD-311)  The following medications were removed from the medication list:    Celexa 20 Mg Tabs (Citalopram hydrobromide) .[name]... Take 1 by mouth qd  Problem # 8:  BREAST CANCER, HX OF (ICD-V10.3) reports recent mamo in past 2 months - normal - will send for records re: this  Complete Medication List: 1)  Synthroid 125 Mcg Tabs (Levothyroxine sodium) .... Take 1 by mouth qd 2)  Klor-con 10 10 Meq Cr-tabs (Potassium chloride) .... Take 2 q am, 1 at noon and 1 in the pm 3)  Ambien 10 Mg Tabs (Zolpidem tartrate) .... Take 1 at bedtime 4)  Hydrochlorothiazide 25 Mg Tabs  (Hydrochlorothiazide) .... Take 1 by mouth qd 5)  Nexium 40 Mg Cpdr (Esomeprazole magnesium) .... Take 1 by mouth qd 6)  Diovan 160 Mg Tabs (Valsartan) .... Take 1 by mouth qd 7)  Aspirin 81 Mg Tabs (  Aspirin) .... Once daily 8)  Vitamin D 2000 Unit Caps (Cholecalciferol) .... Once daily 9)  Calcium 500/vitamin D 500-125 Mg-unit Tabs (Calcium carbonate-vitamin d) .... Take 1 two times a day 10)  Multivitamins Tabs (Multiple vitamin) .... Once daily 11)  Fish Oil 1000 Mg Caps (Omega-3 fatty acids) .... Once daily 12)  Fluconazole 150 Mg Tabs (Fluconazole) .... Take as directed  Other Orders: TD Toxoids IM 7 YR + (42683) Admin 1st Vaccine (41962)  Patient Instructions: 1)  it was good to see you today.  2)  test(s) ordered today - your results will be posted on the phone tree for review in 48-72 hours from the time of test completion; call [phone] and enter your 9 digit MRN (listed above on this page, just below your name); if any changes need to be made or there are abnormal results, you will be contacted directly.  3)  no medication changes recommended - continue as ongoing and call if refills needed before your next appointment here 4)  will send for interval history and labs from Dr. Redmond Pulling - release of information signed today 5)  we'll make referral for bone density scan. Our office will contact you regarding this appointment once made.  6)  You have had your tetnus/diptheria immunization today - 7)  it is important that you work on losing weight - monitor your diet and consume fewer calories such as less carbohydrates (sugar) and less fat. you also need to increase your physical activity level - start by walking for 10-20 minutes 3 times per week and work up to 30 minutes 4-5 times each week. 8)  Please schedule a follow-up appointment in 4-6 months, sooner if problems.    Immunization History:  Influenza Immunization History:    Influenza:  historical (03/19/2009)  Pneumovax  Immunization History:    Pneumovax:  historical (06/19/2006)  Immunizations Administered:  Tetanus Vaccine:    Vaccine Type: Td    Site: left deltoid    Mfr: Sanofi Pasteur    Dose: 0.5 ml    Route: IM    Given by: Tomma Lightning    Exp. Date: 05/04/2011    Lot #: [id number]    VIS given: 10/04/09

## 2010-07-20 NOTE — Progress Notes (Signed)
Summary: synthroid  Phone Note Refill Request Message from:  Fax from Pharmacy on January 18, 2010 10:12 AM  Refills Requested: Medication #1:  Synthroid 161mcg take 1 po qd # 30   Last Refilled: 12/09/2009 Initial call taken by: Tomma Lightning RMA,  January 18, 2010 10:12 AM    New/Updated Medications: SYNTHROID 137 MCG TABS (LEVOTHYROXINE SODIUM) take 1 by mouth once daily Prescriptions: SYNTHROID 137 MCG TABS (LEVOTHYROXINE SODIUM) take 1 by mouth once daily  #30 x 2   Entered by:   Tomma Lightning RMA   Authorized by:   Rowe Clack MD   Signed by:   Tomma Lightning RMA on 01/18/2010   Method used:   Electronically to        [id number] (retail)       [address]       [place], [place]  [address]       Ph: [phone] or [phone]       Fax: [phone]   RxID:   [id number]

## 2010-07-20 NOTE — Assessment & Plan Note (Signed)
Summary: cough/elevated blood pressure/lb   Vital Signs:  Patient profile:   77 year old female Height:      64.5 inches (163.83 cm) Weight:      270.8 pounds (123.09 kg) O2 Sat:      96 % on Room air Temp:     97.6 degrees F (36.44 degrees C) oral Pulse rate:   85 / minute BP sitting:   138 / 86  (left arm) Cuff size:   regular  Vitals Entered By: Orlan Leavens RMA (March 04, 2010 4:39 PM)  O2 Flow:  Room air CC: elevated BP & cough Is Patient Diabetic? No Pain Assessment Patient in pain? no      Comments Pt states she has started back taking HCTZ & klor con   Primary Care Provider:  Newt Lukes MD  CC:  elevated BP & cough.  History of Present Illness: here for cough onset 4 days ago course progressive - a/w dry cough and sore throat no dyspnea or CP - no f/c +allergic rhinitis symptoms ; increasing reflux symptoms   also review other chronic issues: 1) hypothyroid - related to surg resection for goiter when hyperparathyroid glands (3 of 4) removed - no hx thyroid cancer - levels and dose fluctuating recently - feels fatigued and c/o brittle nails so concerned level is off - chronic weight gain - no bowel changes  2) HTN - reports compliance with ongoing medical treatment and no changes in medication dose or frequency. denies adverse side effects related to current therapy. no CP, edema or HA - no hx CVA or CAD - ?BP running higher than usual  3) dyslipidemia - prev on lipitor but stopped 07/2009 (pt unclear as to why) - takes fish oil - denies muscle weakenss or GI upset -  4) GERD with reflux - reports compliance with ongoing medical treatment and recent change in medication from omep to pantopr. denies adverse side effects related to current therapy.    5) OA, B knees - no other joints affected - s/p TKR for each - keeps active with walking - no chronic pain meds needed   Clinical Review Panels:  Lipid Management   Cholesterol:  107 (07/27/2009)  LDL (bad choesterol):  43 (07/27/2009)   HDL (good cholesterol):  51 (07/27/2009)   Triglycerides:  65 (07/27/2009)  CBC   WBC:  7.8 (02/08/2010)   RBC:  4.46 (02/08/2010)   Hgb:  14.2 (02/08/2010)   Hct:  41.4 (02/08/2010)   Platelets:  172.0 (02/08/2010)   MCV  92.9 (02/08/2010)   MCHC  34.3 (02/08/2010)   RDW  13.7 (02/08/2010)   PMN:  66.2 (02/08/2010)   Lymphs:  23.8 (02/08/2010)   Monos:  7.6 (02/08/2010)   Eosinophils:  2.2 (02/08/2010)   Basophil:  0.2 (02/08/2010)  Complete Metabolic Panel   Glucose:  105 (07/27/2009)   Sodium:  140 (07/27/2009)   Potassium:  3.8 (02/08/2010)   Chloride:  100 (07/27/2009)   CO2:  33 (07/27/2009)   BUN:  20 (07/27/2009)   Creatinine:  0.9 (07/27/2009)   Albumin:  4.0 (07/27/2009)   Total Protein:  7.4 (07/27/2009)   Calcium:  8.9 (07/27/2009)   Total Bili:  1.1 (07/27/2009)   Alk Phos:  120 (07/27/2009)   SGPT (ALT):  11 (07/27/2009)   SGOT (AST):  99 (07/27/2009)   Current Medications (verified): 1)  Klor-Con 10 10 Meq Cr-Tabs (Potassium Chloride) .... Take 2 By Mouth Once Daily 2)  Ambien 10 Mg  Tabs (Zolpidem Tartrate) .... Take 1 At Bedtime 3)  Hydrochlorothiazide 25 Mg Tabs (Hydrochlorothiazide) .... Take 1 By Mouth Once Daily 4)  Protonix 20 Mg Tbec (Pantoprazole Sodium) .[name].. 1 By Mouth Once Daily 5)  Diovan 160 Mg Tabs (Valsartan) .... Take 1 By Mouth Qd 6)  Aspirin 81 Mg Tabs (Aspirin) .... Once Daily 7)  Vitamin D 2000 Unit Caps (Cholecalciferol) .... Once Daily 8)  Calcium 500/vitamin D 500-125 Mg-Unit Tabs (Calcium Carbonate-Vitamin D) .... Take 1 Two Times A Day 9)  Multivitamins  Tabs (Multiple Vitamin) .... Once Daily 10)  Fish Oil 1000 Mg Caps (Omega-3 Fatty Acids) .... Take 2 By Mouth Once Daily 11)  Fluconazole 150 Mg Tabs (Fluconazole) .... Take As Directed 12)  Synthroid 137 Mcg Tabs (Levothyroxine Sodium) .... Take 1 By Mouth Once Daily 13)  Align  Caps (Probiotic Product) .... Take 1 By Mouth Once  Daily  Allergies (verified): 1)  ! Codeine 2)  ! Darvon 3)  ! Epinephrine 4)  ! Beta Blockers  Past History:  Past Medical History: GERD Hyperlipidemia  Hypertension Hypothyroidism Breast cancer, hx  - R lumpectomy and XRT- 2000 Depression B knee osteoarthritis    MD roster: gyn -  Review of Systems  The patient denies decreased hearing, hoarseness, headaches, hemoptysis, and abdominal pain.    Physical Exam  General:  overweight-appearing.  alert, well-developed, well-nourished, and cooperative to examination.   nontoxic Eyes:  vision grossly intact; pupils equal, round and reactive to light.  conjunctiva and lids normal.    Ears:  normal pinnae bilaterally, without erythema, swelling, or tenderness to palpation. TMs clear, without effusion, or cerumen impaction. Hearing grossly normal bilaterally  Mouth:  teeth and gums in good repair; mucous membranes moist, without lesions or ulcers. oropharynx clear without exudate, min erythema. +PND Lungs:  normal respiratory effort, no intercostal retractions or use of accessory muscles; normal breath sounds bilaterally - no crackles and no wheezes.    Heart:  normal rate, regular rhythm, no murmur, and no rub. BLE without edema.  mild distal varicosities BLE   Impression & Recommendations:  Problem # 1:  COUGH (ICD-786.2)  suspect related to allg rhinitis and GERD symptoms may represent developing bronchitis- tx symptoms with antihist, antitussive and daily PPI (not as needed) also provided rx for Zpack to take if develops green sputum or fever - pt understands indications for use to call if symptoms worse, or unimproved after tx for further eval  Problem # 2:  HYPERTENSION (ICD-401.9)  Her updated medication list for this problem includes:    Hydrochlorothiazide 25 Mg Tabs (Hydrochlorothiazide) .[name]... Take 1 by mouth once daily    Diovan 160 Mg Tabs (Valsartan) .[name]... Take 2 by mouth once daily (or as instructed)  will  increase ARB to max dose stop HCTZ and Kcl so long as BP <135/85 given problems w/ ur incontinence (s/p gyn eval w/ refer to uro pending)  BP today: 138/86 Prior BP: 110/68 (02/08/2010)  Labs Reviewed: K+: 3.8 (02/08/2010) Creat: : 0.9 (07/27/2009)   Chol: 107 (07/27/2009)   HDL: 51 (07/27/2009)   LDL: 43 (07/27/2009)   TG: 65 (07/27/2009)  Complete Medication List: 1)  Klor-con 10 10 Meq Cr-tabs (Potassium chloride) .... Take 2 by mouth once daily 2)  Ambien 10 Mg Tabs (Zolpidem tartrate) .... Take 1 at bedtime 3)  Hydrochlorothiazide 25 Mg Tabs (Hydrochlorothiazide) .... Take 1 by mouth once daily 4)  Protonix 20 Mg Tbec (Pantoprazole sodium) .[name].. 1 by mouth once  daily 5)  Diovan 160 Mg Tabs (Valsartan) .... Take 2 by mouth once daily (or as instructed) 6)  Aspirin 81 Mg Tabs (Aspirin) .... Once daily 7)  Vitamin D 2000 Unit Caps (Cholecalciferol) .... Once daily 8)  Calcium 500/vitamin D 500-125 Mg-unit Tabs (Calcium carbonate-vitamin d) .... Take 1 two times a day 9)  Multivitamins Tabs (Multiple vitamin) .... Once daily 10)  Fish Oil 1000 Mg Caps (Omega-3 fatty acids) .... Take 2 by mouth once daily 11)  Fluconazole 150 Mg Tabs (Fluconazole) .... Take as directed 12)  Synthroid 137 Mcg Tabs (Levothyroxine sodium) .... Take 1 by mouth once daily 13)  Align Caps (Probiotic product) .... Take 1 by mouth once daily 14)  Azithromycin 250 Mg Tabs (Azithromycin) .... 2 tabs by mouth today, then 1 by mouth daily starting tomorrow 15)  Loratadine 10 Mg Tabs (Loratadine) .[name].. 1 by mouth once daily x 5 days, then as needed for runny nose/sneeze/congestion 16)  Mucinex Dm 30-600 Mg Xr12h-tab (Dextromethorphan-guaifenesin) .[name].. 1 by mouth two times a day x 5 days, then as needed for cough  Patient Instructions: 1)  it was good to see you today. 2)  increase diovan to $RemoveB'320mg'JKsGCfSO$  daily (2 -$RemoveB'160mg'gGrzzker$  tabs) - hold HCTZ and potassium at this time as discussed - check BP weekly and resume if BP>130/85 3)   continue generic protonix for your reflux/stomach in place of nexium - 4)  tylenol pm for sleep as discussed - Zpack antibioitcs to use if fever or worsening symptoms  5)  use claritin for nose and mucinex for cough as described 6)  Please keep scheduled follow-up appointment in 07/2010, call sooner if problems.  will check all labs at that visit so come in fasting 7)  Get plenty of rest, drink lots of clear liquids, and use Tylenol or Ibuprofen for fever and comfort. Return in 7-10 days if you're not better:sooner if you're feeling worse. Prescriptions: AZITHROMYCIN 250 MG TABS (AZITHROMYCIN) 2 tabs by mouth today, then 1 by mouth daily starting tomorrow  #6 x 0   Entered and Authorized by:   Rowe Clack MD   Signed by:   Rowe Clack MD on 03/04/2010   Method used:   Print then Give to Patient   RxID:   [id number]

## 2010-07-20 NOTE — Letter (Signed)
Summary: [facility]   Imported By: Phillis Knack 10/06/2009 09:41:04  _____________________________________________________________________  External Attachment:    Type:   Image     Comment:   External Document

## 2010-07-20 NOTE — Assessment & Plan Note (Signed)
Summary: PER PT BP OUT OF WHACK--153/97--DR VL PT/NO CLINIC--STC   Vital Signs:  Patient profile:   77 year old female Height:      64.5 inches Weight:      269 pounds BMI:     45.62 Temp:     98.5 degrees F oral Pulse rate:   72 / minute Pulse rhythm:   regular Resp:     16 per minute BP sitting:   138 / 90  (left arm) Cuff size:   large  Vitals Entered By: Jonathon Resides, CMA(AAMA) (May 10, 2010 9:09 AM) CC: BP issues, nausea, HA, dizziness X 1 wk Is Patient Diabetic? No Comments pt is not taking Align, Loratadine or Mucinex. She has only been taking Diovan 1 once daily    Primary Care Provider:  Rowe Clack MD  CC:  BP issues, nausea, HA, and dizziness X 1 wk.  History of Present Illness: The patient presents for a post-ER visit for dizziness and high BP last week. Her labs were ok. She had a heart cath this year which was ok. She thinks her symptoms were triggered by Cipro. C/o stress. She was not taking 2 Diovans a day.  Current Medications (verified): 1)  Klor-Con 10 10 Meq Cr-Tabs (Potassium Chloride) .... Take 2 By Mouth Once Daily 2)  Ambien 10 Mg Tabs (Zolpidem Tartrate) .... Take 1 At Bedtime 3)  Hydrochlorothiazide 25 Mg Tabs (Hydrochlorothiazide) .... Take 1 By Mouth Once Daily 4)  Protonix 20 Mg Tbec (Pantoprazole Sodium) .[name].. 1 By Mouth Once Daily 5)  Diovan 160 Mg Tabs (Valsartan) .... Take 2 By Mouth Once Daily (Or As Instructed) 6)  Aspirin 81 Mg Tabs (Aspirin) .... Once Daily 7)  Vitamin D 2000 Unit Caps (Cholecalciferol) .... Once Daily 8)  Calcium 500/vitamin D 500-125 Mg-Unit Tabs (Calcium Carbonate-Vitamin D) .... Take 1 Two Times A Day 9)  Multivitamins  Tabs (Multiple Vitamin) .... Once Daily 10)  Fish Oil 1000 Mg Caps (Omega-3 Fatty Acids) .... Take 2 By Mouth Once Daily 11)  Fluconazole 150 Mg Tabs (Fluconazole) .... Take As Directed 12)  Synthroid 137 Mcg Tabs (Levothyroxine Sodium) .... Take 1 By Mouth Once Daily 13)  Align  Caps  (Probiotic Product) .... Take 1 By Mouth Once Daily 14)  Loratadine 10 Mg Tabs (Loratadine) .[name].. 1 By Mouth Once Daily X 5 Days, Then As Needed For Runny Nose/sneeze/congestion 15)  Mucinex Dm 30-600 Mg Xr12h-Tab (Dextromethorphan-Guaifenesin) .[name].. 1 By Mouth Two Times A Day X 5 Days, Then As Needed For Cough 16)  Fiber Caps .... 2 By Mouth Once Daily  Allergies (verified): 1)  ! Codeine 2)  ! Darvon 3)  ! Epinephrine 4)  ! Beta Blockers 5)  Cipro  Past History:  Past Medical History: Last updated: 03/04/2010 GERD Hyperlipidemia  Hypertension Hypothyroidism Breast cancer, hx  - R lumpectomy and XRT- 2000 Depression B knee osteoarthritis    MD roster: gyn -  Social History: Last updated: 10/04/2009 Never Smoked rare alcohol married, lives with spouse at [facility] since 08/2009  Review of Systems       The patient complains of difficulty walking.  The patient denies hoarseness and dyspnea on exertion.         mild nausea  Physical Exam  General:  overweight-appearing.  alert, well-developed, well-nourished, and cooperative to examination.   nontoxic Mouth:  teeth and gums in good repair; mucous membranes moist, without lesions or ulcers. oropharynx clear without exudate, min erythema. +  PND Lungs:  normal respiratory effort, no intercostal retractions or use of accessory muscles; normal breath sounds bilaterally - no crackles and no wheezes.    Heart:  normal rate, regular rhythm, no murmur, and no rub. BLE without edema.  mild distal varicosities BLE Abdomen:  obese, soft, non-tender, normal bowel sounds, no distention; no masses and no appreciable hepatomegaly or splenomegaly.   Msk:  s/p B TKR - FROM, no appreciable effusions Neurologic:  alert & oriented X3 and cranial nerves II-XII symetrically intact.  strength normal in all extremities, sensation intact to light touch, and gait normal. speech fluent without dysarthria or aphasia; follows commands with good  comprehension.  Skin:  no rashes, vesicles, ulcers, or erythema. No nodules or irregularity to palpation.  Psych:  Oriented X3, memory intact for recent and remote, normally interactive, good eye contact, not anxious appearing, not depressed appearing, and not agitated.   not suicidal and not homicidal.     Impression & Recommendations:  Problem # 1:  HYPERTENSION (ICD-401.9) Assessment Deteriorated  The following medications were removed from the medication list:    Hydrochlorothiazide 25 Mg Tabs (Hydrochlorothiazide) .[name]... Take 1 by mouth once daily    Diovan 160 Mg Tabs (Valsartan) .[name]... Take 2 by mouth once daily (or as instructed) Her updated medication list for this problem includes:    Diovan Hct 320-12.5 Mg Tabs (Valsartan-hydrochlorothiazide) .[name]... 1 by mouth qam for blood pressure  Problem # 2:  NAUSEA (ICD-787.02) Assessment: New  Orders: Gastroenterology Referral (GI)  Problem # 3:  DEPRESSION/ANXIETY (ICD-300.4) Assessment: Deteriorated  Orders: Psychology Referral (Psychology)  Complete Medication List: 1)  Klor-con 10 10 Meq Cr-tabs (Potassium chloride) .... Take 2 by mouth once daily 2)  Ambien 10 Mg Tabs (Zolpidem tartrate) .... Take 1 at bedtime 3)  Protonix 20 Mg Tbec (Pantoprazole sodium) .[name].. 1 by mouth once daily 4)  Aspirin 81 Mg Tabs (Aspirin) .... Once daily 5)  Vitamin D 2000 Unit Caps (Cholecalciferol) .... Once daily 6)  Multivitamins Tabs (Multiple vitamin) .... Once daily 7)  Fish Oil 1000 Mg Caps (Omega-3 fatty acids) .... Take 2 by mouth once daily 8)  Fluconazole 150 Mg Tabs (Fluconazole) .... Take as directed 9)  Synthroid 137 Mcg Tabs (Levothyroxine sodium) .... Take 1 by mouth once daily 10)  Loratadine 10 Mg Tabs (Loratadine) .[name].. 1 by mouth once daily x 5 days, then as needed for runny nose/sneeze/congestion 11)  Mucinex Dm 30-600 Mg Xr12h-tab (Dextromethorphan-guaifenesin) .[name].. 1 by mouth two times a day x 5 days, then as needed for  cough 12)  Fiber Caps  .... 2 by mouth once daily 13)  Diovan Hct 320-12.5 Mg Tabs (Valsartan-hydrochlorothiazide) .[name].. 1 by mouth qam for blood pressure  Patient Instructions: 1)  Please schedule a follow-up appointment in 2 weeks Dr Asa Lente. 2)  Call if you are not better in a reasonable amount of time or if worse. Go to ER if feeling really bad!  Prescriptions: DIOVAN HCT 320-12.5 MG TABS (VALSARTAN-HYDROCHLOROTHIAZIDE) 1 by mouth qam for blood pressure  #30 x 11   Entered and Authorized by:   Cassandria Anger MD   Signed by:   Cassandria Anger MD on 05/10/2010   Method used:   Print then Give to Patient   RxID:   [id number]    Orders Added: 1)  Psychology Referral [Psychology] 2)  Gastroenterology Referral [GI] 3)  Est. Patient Level IV [08022]

## 2010-07-20 NOTE — Progress Notes (Signed)
Summary: diovan  Phone Note Refill Request Message from:  Fax from Pharmacy on Oct 26, 2009 12:53 PM  Refills Requested: Medication #1:  DIOVAN 160 MG TABS take 1 by mouth qd  Method Requested: Electronic Initial call taken by: Tomma Lightning,  Oct 26, 2009 12:53 PM    Prescriptions: DIOVAN 160 MG TABS (VALSARTAN) take 1 by mouth qd  #90 x 1   Entered by:   Tomma Lightning   Authorized by:   Rowe Clack MD   Signed by:   Tomma Lightning on 10/26/2009   Method used:   Electronically to        [id number] (retail)       [address]       [place], [place]  [address]       Ph: [phone] or [phone]       Fax: [phone]   RxID:   [id number]

## 2010-07-20 NOTE — Progress Notes (Signed)
Summary: fluconazole  Phone Note Refill Request Message from:  Fax from Pharmacy on Nov 09, 2009 1:06 PM  Refills Requested: Medication #1:  FLUCONAZOLE 150 MG TABS take as directed. # 2   Last Refilled: [facility]/21/2009 [facility] [address]  Is this ok to renew?  Initial call taken by: Tomma Lightning,  Nov 09, 2009 1:06 PM  Follow-up for Phone Call        yes - as before - thanks Follow-up by: Rowe Clack MD,  Nov 09, 2009 1:15 PM  Additional Follow-up for Phone Call Additional follow up Details #1::        sent to pharm Additional Follow-up by: Tomma Lightning,  Nov 09, 2009 1:45 PM    Prescriptions: FLUCONAZOLE 150 MG TABS (FLUCONAZOLE) take as directed  #2 x 0   Entered by:   Tomma Lightning   Authorized by:   Rowe Clack MD   Signed by:   Tomma Lightning on 11/09/2009   Method used:   Electronically to        [id number] (retail)       [address]       [place], [place]  [address]       Ph: [phone] or [phone]       Fax: [phone]   RxID:   [id number]

## 2010-07-21 NOTE — Letter (Signed)
Summary: [facility] Consult Scheduled Letter  [facility] [address]   [place], [place] [address]   Phone: [phone]  Fax: [phone]      05/10/2010 MRN: [id number]  [place], [place]  [address]    Dear Ms. AHUJA,      We have scheduled an appointment for you.  At the recommendation of Dr.Leschber ,we have scheduled you a consult with Retia Passe on January 11,2012 at 10:00 am .Their phone number is [phone]. If this appointment day and time is not convenient for you, please feel free to call the office of the doctor you are being referred to at the number listed above and reschedule the appointment.  [facility] [place] [address]   please give a 24/hr notice if you need to cancel/Reschedule to avoid a $50.00 Fee. Also bring  insurance card and any co-pay due at time of visit   Thank you,  Patient Care Coordinator [facility]

## 2010-07-21 NOTE — Progress Notes (Signed)
Summary: zolpidem  Phone Note Refill Request Message from:  Fax from Pharmacy on June 21, 2010 10:40 AM  Refills Requested: Medication #1:  AMBIEN 10 MG TABS take 1 at bedtime # 90   Last Refilled: [facility]/21/2010 [facility] Last ov 05/24/10 Is this ok to refill?   Method Requested: Fax to Local Pharmacy Next Appointment Scheduled: 08/16/10 Initial call taken by: Tomma Lightning RMA,  June 21, 2010 10:41 AM  Follow-up for Phone Call        ok to fill and fax Follow-up by: Rowe Clack MD,  June 21, 2010 11:10 AM  Additional Follow-up for Phone Call Additional follow up Details #1::        Faxed script to [facility] pharmacy. EMR has been updated Additional Follow-up by: Tomma Lightning RMA,  June 21, 2010 11:41 AM    Prescriptions: AMBIEN 10 MG TABS (ZOLPIDEM TARTRATE) take 1 at bedtime  #90 x 1   Entered and Authorized by:   Rowe Clack MD   Signed by:   Rowe Clack MD on 06/21/2010   Method used:   Printed then faxed to ...       [id number] (retail)       [address]       [place], [place]  [address]       Ph: [phone] or [phone]       Fax: [phone]   RxID:   [id number]

## 2010-07-21 NOTE — Letter (Signed)
Summary: [facility]   Imported By: Bubba Hales 07/13/2010 08:17:00  _____________________________________________________________________  External Attachment:    Type:   Image     Comment:   External Document

## 2010-08-09 ENCOUNTER — Ambulatory Visit (INDEPENDENT_AMBULATORY_CARE_PROVIDER_SITE_OTHER): Payer: Medicare Other | Admitting: Licensed Clinical Social Worker

## 2010-08-09 DIAGNOSIS — F331 Major depressive disorder, recurrent, moderate: Secondary | ICD-10-CM

## 2010-08-15 ENCOUNTER — Telehealth: Payer: Self-pay | Admitting: Internal Medicine

## 2010-08-16 ENCOUNTER — Ambulatory Visit: Payer: Self-pay | Admitting: Internal Medicine

## 2010-08-23 ENCOUNTER — Other Ambulatory Visit: Payer: Self-pay | Admitting: Internal Medicine

## 2010-08-23 ENCOUNTER — Ambulatory Visit (INDEPENDENT_AMBULATORY_CARE_PROVIDER_SITE_OTHER): Payer: Medicare Other | Admitting: Internal Medicine

## 2010-08-23 ENCOUNTER — Other Ambulatory Visit: Payer: Medicare Other

## 2010-08-23 ENCOUNTER — Encounter: Payer: Self-pay | Admitting: Internal Medicine

## 2010-08-23 ENCOUNTER — Ambulatory Visit: Payer: Medicare Other | Admitting: Licensed Clinical Social Worker

## 2010-08-23 DIAGNOSIS — E785 Hyperlipidemia, unspecified: Secondary | ICD-10-CM

## 2010-08-23 DIAGNOSIS — I10 Essential (primary) hypertension: Secondary | ICD-10-CM

## 2010-08-23 DIAGNOSIS — E039 Hypothyroidism, unspecified: Secondary | ICD-10-CM

## 2010-08-23 DIAGNOSIS — R5381 Other malaise: Secondary | ICD-10-CM

## 2010-08-23 DIAGNOSIS — R5383 Other fatigue: Secondary | ICD-10-CM

## 2010-08-23 LAB — CBC WITH DIFFERENTIAL/PLATELET
Basophils Relative: 0.5 % (ref 0.0–3.0)
Eosinophils Absolute: 0.2 10*3/uL (ref 0.0–0.7)
Eosinophils Relative: 2.4 % (ref 0.0–5.0)
Hemoglobin: 14.9 g/dL (ref 12.0–15.0)
Lymphocytes Relative: 25.8 % (ref 12.0–46.0)
Monocytes Relative: 7.3 % (ref 3.0–12.0)
Neutrophils Relative %: 64 % (ref 43.0–77.0)
RBC: 4.72 Mil/uL (ref 3.87–5.11)
WBC: 7.3 10*3/uL (ref 4.5–10.5)

## 2010-08-23 LAB — BASIC METABOLIC PANEL
Calcium: 8.6 mg/dL (ref 8.4–10.5)
Creatinine, Ser: 0.8 mg/dL (ref 0.4–1.2)
Sodium: 142 mEq/L (ref 135–145)

## 2010-08-23 LAB — HEPATIC FUNCTION PANEL
ALT: 14 U/L (ref 0–35)
AST: 22 U/L (ref 0–37)
Alkaline Phosphatase: 81 U/L (ref 39–117)
Bilirubin, Direct: 0.2 mg/dL (ref 0.0–0.3)
Total Protein: 6.6 g/dL (ref 6.0–8.3)

## 2010-08-23 LAB — LIPID PANEL
HDL: 43.2 mg/dL (ref >39.00)
LDL Cholesterol: 112 mg/dL — ABNORMAL HIGH (ref 0–99)
Total CHOL/HDL Ratio: 4
Triglycerides: 167 mg/dL — ABNORMAL HIGH (ref 0.0–149.0)

## 2010-08-25 ENCOUNTER — Ambulatory Visit (INDEPENDENT_AMBULATORY_CARE_PROVIDER_SITE_OTHER): Payer: Medicare Other | Admitting: Licensed Clinical Social Worker

## 2010-08-25 DIAGNOSIS — F331 Major depressive disorder, recurrent, moderate: Secondary | ICD-10-CM

## 2010-08-25 NOTE — Progress Notes (Signed)
Summary: econazole nitrate 1%  Phone Note Refill Request Message from:  Fax from Pharmacy on August 15, 2010 10:38 AM  Refills Requested: Medication #1:  Econazole Nitrate 1% cream apply to affected area bid for 14 days 30gm   Last Refilled: [facility]/09/2009 [facility] [phone] Med is not on med list. Is this ok to refill?   Method Requested: Electronic Initial call taken by: Tomma Lightning RMA,  August 15, 2010 10:39 AM  Follow-up for Phone Call        ok to add to list and fill as requested Follow-up by: Rowe Clack MD,  August 15, 2010 12:35 PM    New/Updated Medications: ECONAZOLE NITRATE 1 % CREA (ECONAZOLE NITRATE) apply to affected area two times a day for 14 days Prescriptions: ECONAZOLE NITRATE 1 % CREA (ECONAZOLE NITRATE) apply to affected area two times a day for 14 days  #30gm x 1   Entered by:   Tomma Lightning RMA   Authorized by:   Rowe Clack MD   Signed by:   Tomma Lightning RMA on 08/15/2010   Method used:   Electronically to        [id number] (retail)       [address]       [place], [place]  [address]       Ph: [phone] or [phone]       Fax: [phone]   RxID:   [id number]

## 2010-08-30 LAB — COMPREHENSIVE METABOLIC PANEL
AST: 22 U/L (ref 0–37)
CO2: 27 mEq/L (ref 19–32)
Calcium: 8.7 mg/dL (ref 8.4–10.5)
Creatinine, Ser: 0.94 mg/dL (ref 0.4–1.2)
GFR calc Af Amer: >60 mL/min (ref >60)
GFR calc non Af Amer: 58 mL/min — ABNORMAL LOW (ref >60)

## 2010-08-30 LAB — URINALYSIS, ROUTINE W REFLEX MICROSCOPIC
Ketones, ur: NEGATIVE mg/dL
Nitrite: NEGATIVE
Protein, ur: NEGATIVE mg/dL

## 2010-08-30 LAB — POCT CARDIAC MARKERS
Myoglobin, poc: 80.5 ng/mL (ref 12–200)
Troponin i, poc: <0.05 ng/mL (ref 0.00–0.09)

## 2010-08-30 LAB — DIFFERENTIAL
Eosinophils Relative: 2 % (ref 0–5)
Lymphocytes Relative: 17 % (ref 12–46)
Lymphs Abs: 1.3 10*3/uL (ref 0.7–4.0)
Neutrophils Relative %: 73 % (ref 43–77)

## 2010-08-30 LAB — CBC
Hemoglobin: 14.3 g/dL (ref 12.0–15.0)
MCH: 31.8 pg (ref 26.0–34.0)
MCHC: 34.9 g/dL (ref 30.0–36.0)
RDW: 13.8 % (ref 11.5–15.5)

## 2010-08-30 NOTE — Assessment & Plan Note (Signed)
Summary: 3 MTH FU--STC   Vital Signs:  Patient profile:   77 year old female Weight:      277.4 pounds (126.09 kg) O2 Sat:      96 % on Room air Temp:     97.8 degrees F (36.56 degrees C) oral Pulse rate:   83 / minute BP sitting:   120 / 82  (left arm) Cuff size:   large  Vitals Entered By: Tomma Lightning RMA (August 23, 2010 11:13 AM)  O2 Flow:  Room air CC: 3 month follow-up Is Patient Diabetic? No Pain Assessment Patient in pain? no      Comments Pt states she stop her Diovan med was making her feel bad, been taking HCTZ 12.5 2 by mouth daily   Primary Care Provider:  Rowe Clack MD  CC:  3 month follow-up.  History of Present Illness: here for f/u:  1) hypothyroid - related to surg resection for goiter when hyperparathyroid glands (3 of 4) removed - no hx thyroid cancer - levels and dose fluctuating recently - feels fatigued and c/o brittle nails so concerned level is off - chronic weight gain - no bowel changes  2) HTN - med change 11/2011to diovan but caused nausea and weak feeling so remians on hctz only- reports compliance with ongoing medical treatment and no changes in medication dose or frequency. denies adverse side effects related to current therapy. no CP, edema or HA - no hx CVA or CAD (neg cath 03/2010 hosp for CP)  3) dyslipidemia - prev on lipitor but stopped 07/2009 (pt unclear as to why) - takes fish oil - denies muscle weakenss or GI upset -  4) GERD with reflux - reports compliance with ongoing medical treatment and recent change in medication from omep to pantopr. denies adverse side effects related to current therapy.  pending GI appt for same  5) OA, B knees - no other joints affected - s/p TKR for each - keeps active with walking - no chronic pain meds needed  6) depression - has begun seeing susan bond due to life stressors - feels improved - wishes to avoid meds if poss -   7) obesity - fustrated with weight gain since spring 2011 - cut cal  but would like more diet help   Clinical Review Panels:  Lipid Management   Cholesterol:  107 (07/27/2009)   LDL (bad choesterol):  43 (07/27/2009)   HDL (good cholesterol):  51 (07/27/2009)   Triglycerides:  65 (07/27/2009)  CBC   WBC:  7.8 (02/08/2010)   RBC:  4.46 (02/08/2010)   Hgb:  14.2 (02/08/2010)   Hct:  41.4 (02/08/2010)   Platelets:  172.0 (02/08/2010)   MCV  92.9 (02/08/2010)   MCHC  34.3 (02/08/2010)   RDW  13.7 (02/08/2010)   PMN:  66.2 (02/08/2010)   Lymphs:  23.8 (02/08/2010)   Monos:  7.6 (02/08/2010)   Eosinophils:  2.2 (02/08/2010)   Basophil:  0.2 (02/08/2010)  Complete Metabolic Panel   Glucose:  105 (07/27/2009)   Sodium:  140 (07/27/2009)   Potassium:  3.8 (02/08/2010)   Chloride:  100 (07/27/2009)   CO2:  33 (07/27/2009)   BUN:  20 (07/27/2009)   Creatinine:  0.9 (07/27/2009)   Albumin:  4.0 (07/27/2009)   Total Protein:  7.4 (07/27/2009)   Calcium:  8.9 (07/27/2009)   Total Bili:  1.1 (07/27/2009)   Alk Phos:  120 (07/27/2009)   SGPT (ALT):  11 (07/27/2009)  SGOT (AST):  99 (07/27/2009)   Current Medications (verified): 1)  Klor-Con 10 10 Meq Cr-Tabs (Potassium Chloride) .... Take 2 By Mouth Once Daily 2)  Ambien 10 Mg Tabs (Zolpidem Tartrate) .... Take 1 At Bedtime 3)  Protonix 20 Mg Tbec (Pantoprazole Sodium) .[name].. 1 By Mouth Once Daily 4)  Aspirin 81 Mg Tabs (Aspirin) .... Once Daily 5)  Multivitamins  Tabs (Multiple Vitamin) .... Once Daily 6)  Fish Oil 1000 Mg Caps (Omega-3 Fatty Acids) .... Take 2 By Mouth Once Daily 7)  Fluconazole 150 Mg Tabs (Fluconazole) .... Take As Directed 8)  Synthroid 137 Mcg Tabs (Levothyroxine Sodium) .... Take 1 By Mouth Once Daily 9)  Loratadine 10 Mg Tabs (Loratadine) .[name].. 1 By Mouth Once Daily X 5 Days, Then As Needed For Runny Nose/sneeze/congestion 10)  Mucinex Dm 30-600 Mg Xr12h-Tab (Dextromethorphan-Guaifenesin) .[name].. 1 By Mouth Two Times A Day X 5 Days, Then As Needed For Cough 11)  Fiber Caps  .... 2 By Mouth Once Daily 12)  Econazole Nitrate 1 % Crea (Econazole Nitrate) .... Apply To Affected Area Two Times A Day For 14 Days  Allergies (verified): 1)  ! Codeine 2)  ! Darvon 3)  ! Epinephrine 4)  ! Beta Blockers 5)  Cipro  Past History:  Past Medical History: GERD Hyperlipidemia  Hypertension Hypothyroidism Breast cancer, hx  - R lumpectomy and XRT- 2000 Depression B knee osteoarthritis    MD roster: gyn - mody card - nishan  Review of Systems  The patient denies fever, chest pain, syncope, and headaches.    Physical Exam  General:  overweight-appearing.  alert, well-developed, well-nourished, and cooperative to examination.   spouse at side Lungs:  normal respiratory effort, no intercostal retractions or use of accessory muscles; normal breath sounds bilaterally - no crackles and no wheezes.    Heart:  normal rate, regular rhythm, no murmur, and no rub. BLE without edema.  mild distal varicosities BLE Psych:  Oriented X3, memory intact for recent and remote, normally interactive, good eye contact, not anxious appearing, not depressed appearing, and not agitated.   not suicidal and not homicidal.     Impression & Recommendations:  Problem # 1:  FATIGUE (ICD-780.79)  Orders: TLB-BMP (Basic Metabolic Panel-BMET) (84132-[id number]) TLB-CBC Platelet - w/Differential (85025-CBCD) TLB-Hepatic/Liver Function Pnl (80076-HEPATIC)  Problem # 2:  HYPERTENSION (ICD-401.9)  try alt ARB in effort to reduce HCTZ dose (due to ur incont issues)- new erx done - The following medications were removed from the medication list:    Diovan Hct 320-12.5 Mg Tabs (Valsartan-hydrochlorothiazide) .[name]... 1 by mouth qam for blood pressure Her updated medication list for this problem includes:    Hydrochlorothiazide 25 Mg Tabs (Hydrochlorothiazide) .[name]... 1 by mouth once daily (or as directed)    Losartan Potassium 25 Mg Tabs (Losartan potassium) .[name]... 1 by mouth once  daily  Orders: TLB-BMP (Basic Metabolic Panel-BMET) ([id number]) Prescription Created Electronically [phone])  BP today: 120/82 Prior BP: 102/82 (05/24/2010)  Labs Reviewed: K+: 3.8 (02/08/2010) Creat: : 0.9 (07/27/2009)   Chol: 107 (07/27/2009)   HDL: 51 (07/27/2009)   LDL: 43 (07/27/2009)   TG: 65 (07/27/2009)  Problem # 3:  HYPERLIPIDEMIA (ICD-272.4)  Orders: TLB-Lipid Panel (80061-LIPID)  off Lipitor by prior PCP 07/2009 -  presume due to excellent counts and no other risk factors for secondary prevention needs should cont fish oil -   Labs Reviewed: SGOT: 99 (07/27/2009)   SGPT: 11 (07/27/2009)   HDL:51 (07/27/2009), 44 (03/26/2009)  LDL:43 (07/27/2009),  38 (03/26/2009)  Chol:107 (07/27/2009), 99 (03/26/2009)  Trig:65 (07/27/2009), 87 (03/26/2009)  Problem # 4:  HYPOTHYROIDISM (ICD-244.9)  Her updated medication list for this problem includes:    Synthroid 137 Mcg Tabs (Levothyroxine sodium) .[name]... Take 1 by mouth once daily  Orders: TLB-TSH (Thyroid Stimulating Hormone) (84443-TSH)  Labs Reviewed: TSH: 1.34 (02/08/2010)    Chol: 107 (07/27/2009)   HDL: 51 (07/27/2009)   LDL: 43 (07/27/2009)   TG: 65 (07/27/2009)  Complete Medication List: 1)  Klor-con 10 10 Meq Cr-tabs (Potassium chloride) .... Take 2 by mouth once daily 2)  Ambien 10 Mg Tabs (Zolpidem tartrate) .... Take 1 at bedtime 3)  Protonix 20 Mg Tbec (Pantoprazole sodium) .[name].. 1 by mouth once daily 4)  Aspirin 81 Mg Tabs (Aspirin) .... Once daily 5)  Multivitamins Tabs (Multiple vitamin) .... Once daily 6)  Fish Oil 1000 Mg Caps (Omega-3 fatty acids) .... Take 2 by mouth once daily 7)  Fluconazole 150 Mg Tabs (Fluconazole) .... Take as directed 8)  Synthroid 137 Mcg Tabs (Levothyroxine sodium) .... Take 1 by mouth once daily 9)  Loratadine 10 Mg Tabs (Loratadine) .[name].. 1 by mouth once daily x 5 days, then as needed for runny nose/sneeze/congestion 10)  Mucinex Dm 30-600 Mg Xr12h-tab  (Dextromethorphan-guaifenesin) .[name].. 1 by mouth two times a day x 5 days, then as needed for cough 11)  Fiber Caps  .... 2 by mouth once daily 12)  Econazole Nitrate 1 % Crea (Econazole nitrate) .... Apply to affected area two times a day for 14 days 13)  Hydrochlorothiazide 25 Mg Tabs (Hydrochlorothiazide) .[name].. 1 by mouth once daily (or as directed) 14)  Celexa 10 Mg Tabs (Citalopram hydrobromide) .[name].. 1 by mouth once daily (or as directed) 15)  Losartan Potassium 25 Mg Tabs (Losartan potassium) .[name].. 1 by mouth once daily  Patient Instructions: 1)  it was good to see you today. 2)  start losartan for blood pressure - take with HCTZ - your prescriptions have been electronically submitted to your pharmacy. Please take as directed. Contact our office if you believe you're having problems with the medication(s).  3)  test(s) ordered today - your results will be posted on the phone tree for review in 48-72 hours from the time of test completion; call [phone] and enter your 9 digit MRN (listed above on this page, just below your name); if any changes need to be made or there are abnormal results, you will be contacted directly.  4)  Please schedule a follow-up appointment in 3 months to monitor blood pressure and weight, call sooner if problems.  5)  Check your Blood Pressure regularly. If it is above: 140/85 you should make an appointment. Prescriptions: LOSARTAN POTASSIUM 25 MG TABS (LOSARTAN POTASSIUM) 1 by mouth once daily  #30 x 3   Entered and Authorized by:   Rowe Clack MD   Signed by:   Rowe Clack MD on 08/23/2010   Method used:   Electronically to        [id number] (retail)       [address]       [place], [place]  [address]       Ph: [phone] or [phone]       Fax: [phone]   RxID:   [id number]    Orders Added: 1)  TLB-TSH (Thyroid Stimulating Hormone) [84443-TSH] 2)  TLB-Lipid Panel [80061-LIPID] 3)  TLB-BMP (Basic Metabolic Panel-BMET)  [[id number]] 4)  TLB-CBC Platelet - w/Differential [85025-CBCD] 5)  TLB-Hepatic/Liver Function Pnl [80076-HEPATIC] 6)  Est. Patient Level IV [18984] 7)  Prescription Created Electronically [phone]

## 2010-08-31 ENCOUNTER — Telehealth: Payer: Self-pay | Admitting: Internal Medicine

## 2010-09-01 LAB — BASIC METABOLIC PANEL
BUN: 11 mg/dL (ref 6–23)
CO2: 26 mEq/L (ref 19–32)
CO2: 27 mEq/L (ref 19–32)
CO2: 29 mEq/L (ref 19–32)
Calcium: 8.3 mg/dL — ABNORMAL LOW (ref 8.4–10.5)
Calcium: 8.3 mg/dL — ABNORMAL LOW (ref 8.4–10.5)
Chloride: 104 mEq/L (ref 96–112)
Creatinine, Ser: 0.82 mg/dL (ref 0.4–1.2)
GFR calc Af Amer: >60 mL/min (ref >60)
GFR calc Af Amer: >60 mL/min (ref >60)
GFR calc non Af Amer: >60 mL/min (ref >60)
GFR calc non Af Amer: >60 mL/min (ref >60)
GFR calc non Af Amer: >60 mL/min (ref >60)
Glucose, Bld: 108 mg/dL — ABNORMAL HIGH (ref 70–99)
Glucose, Bld: 109 mg/dL — ABNORMAL HIGH (ref 70–99)
Potassium: 3.1 mEq/L — ABNORMAL LOW (ref 3.5–5.1)
Potassium: 3.4 mEq/L — ABNORMAL LOW (ref 3.5–5.1)
Potassium: 3.5 mEq/L (ref 3.5–5.1)
Sodium: 138 mEq/L (ref 135–145)
Sodium: 140 mEq/L (ref 135–145)
Sodium: 140 mEq/L (ref 135–145)
Sodium: 142 mEq/L (ref 135–145)

## 2010-09-01 LAB — CBC
HCT: 38.3 % (ref 36.0–46.0)
HCT: 41.9 % (ref 36.0–46.0)
Hemoglobin: 13.5 g/dL (ref 12.0–15.0)
Hemoglobin: 13.9 g/dL (ref 12.0–15.0)
Hemoglobin: 14.1 g/dL (ref 12.0–15.0)
MCH: 30.5 pg (ref 26.0–34.0)
MCHC: 33.7 g/dL (ref 30.0–36.0)
MCHC: 34.5 g/dL (ref 30.0–36.0)
MCV: 91.2 fL (ref 78.0–100.0)
RBC: 4.42 MIL/uL (ref 3.87–5.11)
RBC: 4.47 MIL/uL (ref 3.87–5.11)
RBC: 4.61 MIL/uL (ref 3.87–5.11)
RDW: 13.3 % (ref 11.5–15.5)
WBC: 5.9 10*3/uL (ref 4.0–10.5)
WBC: 6.5 10*3/uL (ref 4.0–10.5)
WBC: 7.9 10*3/uL (ref 4.0–10.5)

## 2010-09-01 LAB — POCT CARDIAC MARKERS
CKMB, poc: 1.7 ng/mL (ref 1.0–8.0)
Myoglobin, poc: 76 ng/mL (ref 12–200)
Myoglobin, poc: 95.9 ng/mL (ref 12–200)

## 2010-09-01 LAB — DIFFERENTIAL
Basophils Absolute: 0 10*3/uL (ref 0.0–0.1)
Lymphocytes Relative: 25 % (ref 12–46)
Monocytes Absolute: 0.5 10*3/uL (ref 0.1–1.0)
Neutro Abs: 5.4 10*3/uL (ref 1.7–7.7)
Neutrophils Relative %: 67 % (ref 43–77)

## 2010-09-01 LAB — LIPID PANEL
Cholesterol: 172 mg/dL (ref 0–200)
LDL Cholesterol: 106 mg/dL — ABNORMAL HIGH (ref 0–99)

## 2010-09-01 LAB — CK TOTAL AND CKMB (NOT AT ARMC): Total CK: 96 U/L (ref 7–177)

## 2010-09-01 LAB — HEPARIN LEVEL (UNFRACTIONATED)
Heparin Unfractionated: 0.27 IU/mL — ABNORMAL LOW (ref 0.30–0.70)
Heparin Unfractionated: 0.42 IU/mL (ref 0.30–0.70)
Heparin Unfractionated: 0.43 IU/mL (ref 0.30–0.70)
Heparin Unfractionated: 0.56 IU/mL (ref 0.30–0.70)
Heparin Unfractionated: <0.1 IU/mL — ABNORMAL LOW (ref 0.30–0.70)

## 2010-09-01 LAB — CARDIAC PANEL(CRET KIN+CKTOT+MB+TROPI)
CK, MB: 2.2 ng/mL (ref 0.3–4.0)
Relative Index: INVALID (ref 0.0–2.5)
Total CK: 71 U/L (ref 7–177)
Troponin I: 0.02 ng/mL (ref 0.00–0.06)

## 2010-09-01 LAB — PROTIME-INR: INR: 1.02 (ref 0.00–1.49)

## 2010-09-01 LAB — TROPONIN I: Troponin I: 0.01 ng/mL (ref 0.00–0.06)

## 2010-09-02 ENCOUNTER — Telehealth: Payer: Self-pay | Admitting: Internal Medicine

## 2010-09-06 NOTE — Progress Notes (Signed)
Summary: Call Report  Phone Note Other Incoming   Caller: [facility] Summary of Call: [facility] Triage Call Report Triage Record Num: [id number] Operator: April Finney Patient Name: Tamara Morales Call Date & Time: 08/30/2010 8:08:19PM Patient Phone: [phone] PCP: Gwendolyn Grant Patient Gender: Female PCP Fax : [phone] Patient DOB: September 09, 1930 Practice Name: [facility] Reason for Call: Benjamine Mola calling with back pain onset 08/29/10. Has taken Tylenol. No known injury. Moving arms and legs without difficulty. Urinated in the past 8hrs without difficulty. Afebrile. No emergent symptoms. Has never been seen for back pain and this is 3rd episode. See in 24 hrs care advice given. Protocol(s) Used: Back Symptoms Recommended Outcome per Protocol: See Provider within 24 hours Reason for Outcome: Back pain radiates into thigh or below knee Care Advice:  ~ Call provider if symptoms worsen or new symptoms develop.  ~ Avoid heavy lifting, bending and twisting of the back, and prolonged sitting until evaluated by provider. Apply a cloth-covered cold or ice pack to the area for 20 minutes 4 to 8 times a day for relief of pain for the first 24-48 hours. After 24 to 48 hours of cold application, use a cloth-covered heat pack to the area for 20 minutes 3 to 4 times a day.  ~ Sleep on a moderately firm mattress. Try sleeping on back with a pillow placed under knees or sleep on side with knees bent and a pillow between knees. When lying on stomach, place pillow under the abdomen and pelvis; do not use a pillow for your head.  ~ Go to the ED if you have worsening pain, numbness or weakness of arms or legs, cannot walk, or have new unexplained changes in bladder or bowel function. Another adult should drive.  ~  ~ Avoid activity that causes or worsens symptoms.  ~ SYMPTOM / CONDITION MANAGEMENT  ~ CAUTIONS Analgesic/Antipyretic Advice - Acetaminophen: Consider acetaminophen as  directed on label or by pharmacist/provider for pain or fever PRECAUTIONS: - Use if there is no history of liver disease, alcoholism, or intake of three Initial call taken by: Crissie Sickles, [place],  August 31, 2010 8:11 AM  Follow-up for Phone Call        noted, thx Follow-up by: Rowe Clack MD,  August 31, 2010 8:18 AM

## 2010-09-08 ENCOUNTER — Ambulatory Visit (INDEPENDENT_AMBULATORY_CARE_PROVIDER_SITE_OTHER): Payer: Medicare Other | Admitting: Licensed Clinical Social Worker

## 2010-09-08 DIAGNOSIS — F331 Major depressive disorder, recurrent, moderate: Secondary | ICD-10-CM

## 2010-09-15 NOTE — Progress Notes (Signed)
Summary: Order req  Phone Note Call from Patient Call back at Home Phone [phone]   Caller: Patient Summary of Call: Pt called requesting order for breast prosthesis to be faxed to [facility] at [phone]. Initial call taken by: Crissie Sickles, [place],  September 02, 2010 3:47 PM  Follow-up for Phone Call        ok - rx done - may print and fax as needed - thanks Follow-up by: Rowe Clack MD,  September 05, 2010 8:16 AM  Additional Follow-up for Phone Call Additional follow up Details #1::        Pt advised Additional Follow-up by: Crissie Sickles, [place],  September 05, 2010 8:35 AM    New/Updated Medications: * BREAST PROSTHESIS use as directed -  ICD-9: v10.3 Prescriptions: BREAST PROSTHESIS use as directed -  ICD-9: v10.3  #1 x 0   Entered by:   Crissie Sickles, CMA   Authorized by:   Rowe Clack MD   Signed by:   Crissie Sickles, CMA on 09/05/2010   Method used:   Printed then faxed to ...       [facility] [address] [id number] (retail)       [address]       [place], [place]  [address]       Ph: [phone] or [phone]       Fax: [phone]   RxID:   [id number]

## 2010-09-22 ENCOUNTER — Ambulatory Visit (INDEPENDENT_AMBULATORY_CARE_PROVIDER_SITE_OTHER): Payer: Medicare Other | Admitting: Licensed Clinical Social Worker

## 2010-09-22 DIAGNOSIS — F331 Major depressive disorder, recurrent, moderate: Secondary | ICD-10-CM

## 2010-09-26 ENCOUNTER — Other Ambulatory Visit: Payer: Self-pay | Admitting: *Deleted

## 2010-09-26 MED ORDER — POTASSIUM CHLORIDE 10 MEQ PO TBCR: 20.0000 meq | EXTENDED_RELEASE_TABLET | Freq: Every day | ORAL | Status: DC

## 2010-10-06 ENCOUNTER — Other Ambulatory Visit: Payer: Self-pay | Admitting: Internal Medicine

## 2010-10-07 NOTE — Telephone Encounter (Signed)
To [facility]bin

## 2010-10-12 ENCOUNTER — Other Ambulatory Visit: Payer: Self-pay | Admitting: Internal Medicine

## 2010-10-17 ENCOUNTER — Encounter: Payer: Self-pay | Admitting: Internal Medicine

## 2010-10-18 ENCOUNTER — Ambulatory Visit (INDEPENDENT_AMBULATORY_CARE_PROVIDER_SITE_OTHER): Payer: Medicare Other | Admitting: Internal Medicine

## 2010-10-18 ENCOUNTER — Encounter: Payer: Self-pay | Admitting: Internal Medicine

## 2010-10-18 VITALS — BP 134/86 | HR 88 | Temp 97.0°F | Ht 64.5 in | Wt 276.0 lb

## 2010-10-18 DIAGNOSIS — I10 Essential (primary) hypertension: Secondary | ICD-10-CM

## 2010-10-18 DIAGNOSIS — E785 Hyperlipidemia, unspecified: Secondary | ICD-10-CM

## 2010-10-18 DIAGNOSIS — E039 Hypothyroidism, unspecified: Secondary | ICD-10-CM

## 2010-10-18 MED ORDER — LOSARTAN POTASSIUM 25 MG PO TABS: 25.0000 mg | ORAL_TABLET | Freq: Two times a day (BID) | ORAL | Status: DC

## 2010-10-18 NOTE — Progress Notes (Signed)
Subjective:    Patient ID: Tamara Morales, female    DOB: [birth year], 77 y.o.   MRN: [id number]  HPI  here for uncontrolled BP - home BP log reviewed  Also reviewed chronic medical issues: hypothyroid - related to surg resection for goiter when hyperparathyroid glands (3 of 4) removed - no hx thyroid cancer - feels fatigued and c/o brittle nails when level is off - chronic weight gain - no bowel changes  HTN - see above; diovan rx'd 04/2010 but caused nausea and weak feeling - changed to losartan 08/2010 with hctz as needed - reports compliance with ongoing medical treatment and no changes in medication dose or frequency. denies adverse side effects related to current therapy: no CP, edema or HA - no hx CVA or CAD (neg cath 03/2010 hosp for CP) -   dyslipidemia - prev on lipitor but stopped 07/2009 (pt unclear as to why) - takes fish oil - denies muscle weakenss or GI upset -  GERD with reflux - reports compliance with ongoing medical treatment and recent change in medication from omep to pantopr. denies adverse side effects related to current therapy.  pending GI appt for same  OA, B knees - no other joints affected - s/p TKR for each - keeps active with walking - no chronic pain meds needed  depression - has begun seeing susan bond due to life stressors - feels improved - wishes to avoid meds if poss -   obesity - fustrated with weight gain since spring 2011 - cutting calories but would like more diet help  Past Medical History  Diagnosis Date  . Morbid obesity   . MIGRAINE HEADACHE   . ARTHRITIS, KNEES, BILATERAL   . BREAST CANCER, HX OF 2000  . DEPRESSION   . GERD   . HYPERLIPIDEMIA   . HYPERTENSION   . HYPOTHYROIDISM     Review of Systems  Eyes: Negative for visual disturbance.  Respiratory: Negative for cough and shortness of breath.   Cardiovascular: Negative for chest pain and leg swelling.  Neurological: Negative for weakness and headaches.      Objective:   Physical Exam BP 134/86  Pulse 88  Temp(Src) 97 F (36.1 C) (Oral)  Ht 5' 4.5" (1.638 m)  Wt 276 lb (125.193 kg)  BMI 46.64 kg/m2  SpO2 96% Physical Exam  Constitutional: She is obese; oriented to person, place, and time. She appears well-developed and well-nourished. No distress.  Neck: Normal range of motion. Neck supple. No JVD present. No thyromegaly present.  Cardiovascular: Normal rate, regular rhythm and normal heart sounds.  No murmur heard. trace edema BLE. Pulmonary/Chest: Effort normal and breath sounds normal. No respiratory distress. She has no wheezes.  Neurological: She is alert and oriented to person, place, and time. No cranial nerve deficit. Coordination normal.  Psychiatric: She has a normal mood and affect. Her behavior is normal. Judgment and thought content normal.   Lab Results  Component Value Date   WBC 7.3 08/23/2010   HGB 14.9 08/23/2010   HCT 43.4 08/23/2010   PLT 178.0 08/23/2010   CHOL 189 08/23/2010   TRIG 167.0* 08/23/2010   HDL 43.20 08/23/2010   ALT 14 08/23/2010   AST 22 08/23/2010   NA 142 08/23/2010   K 3.9 08/23/2010   CL 102 08/23/2010   CREATININE 0.8 08/23/2010   BUN 13 08/23/2010   CO2 32 08/23/2010   TSH 2.55 08/23/2010   INR 1.02 03/20/2010  Assessment & Plan:  See problem list. Medications and labs reviewed today.

## 2010-10-18 NOTE — Assessment & Plan Note (Signed)
Home bp log reviewed - variable BP Prior poor tolerance of diovan but tolerating losartan without adv SE- Titrate to BP goal at this time, use HCTZ as needed BP Readings from Last 3 Encounters:  10/18/10 134/86  08/23/10 120/82  05/24/10 102/82

## 2010-10-18 NOTE — Patient Instructions (Addendum)
It was good to see you today. Medications reviewed, will make the following changes at this time: increase losartan to 2x/day and use hctz only as needed for swelling or BP>140s Your prescription(s) have been submitted to your pharmacy. Please take as directed and contact our office if you believe you are having problem(s) with the medication(s). Work on lifestyle changes as discussed (low fat, low carb, increased protein diet; improved exercise efforts; weight loss) to control sugar, blood pressure and cholesterol levels and/or reduce risk of developing other medical problems. Look into [email or web address].[email or web address] or other type of food journal to assist you in this process. Please keep scheduled followup as planned, call sooner if problems.

## 2010-10-20 NOTE — Assessment & Plan Note (Signed)
Prior PCP stopped Lipitor 07/2009 - reminded to work on diet/exercise and weight control to manage same Lab Results  Component Value Date   LDLCALC 112* 08/23/2010

## 2010-10-20 NOTE — Assessment & Plan Note (Signed)
Lab Results  Component Value Date   TSH 2.55 08/23/2010   The current medical regimen is effective;  continue present plan and medications.

## 2010-10-20 NOTE — Assessment & Plan Note (Signed)
Wt Readings from Last 3 Encounters:  10/18/10 276 lb (125.193 kg)  08/23/10 277 lb 6.4 oz (125.828 kg)  05/24/10 275 lb 6.4 oz (124.921 kg)   Education provided today on diet and exercise needs for optimization of weight and health

## 2010-11-03 ENCOUNTER — Ambulatory Visit (INDEPENDENT_AMBULATORY_CARE_PROVIDER_SITE_OTHER): Payer: Medicare Other | Admitting: Licensed Clinical Social Worker

## 2010-11-03 DIAGNOSIS — F331 Major depressive disorder, recurrent, moderate: Secondary | ICD-10-CM

## 2010-11-04 ENCOUNTER — Telehealth: Payer: Self-pay | Admitting: *Deleted

## 2010-11-04 DIAGNOSIS — I10 Essential (primary) hypertension: Secondary | ICD-10-CM

## 2010-11-04 MED ORDER — LOSARTAN POTASSIUM 25 MG PO TABS: 50.0000 mg | ORAL_TABLET | Freq: Two times a day (BID) | ORAL | Status: DC

## 2010-11-04 NOTE — Telephone Encounter (Signed)
Please increase the cozaar to 50 mg bid  Please call in 2 wks with repeat BP's

## 2010-11-04 NOTE — Telephone Encounter (Signed)
Pt states that she was told to call with her BP numbers: Monday 162/84 Tuesday 146/78 Wednesday 160/84 Thursday 136/81 Friday 150/76 [this morning] Pt, is taking the Losartan [2] 1 in AM & 1 in PM, along with the suggestion of adding [1] HCTZ, which she is taking in the morning.  You can leave a message on machine if calling back, as she will not be in.

## 2010-11-04 NOTE — Telephone Encounter (Signed)
Pt informed of new Rx and instructions.

## 2010-11-05 ENCOUNTER — Other Ambulatory Visit: Payer: Self-pay | Admitting: Internal Medicine

## 2010-11-22 ENCOUNTER — Other Ambulatory Visit: Payer: Self-pay | Admitting: Internal Medicine

## 2010-11-23 ENCOUNTER — Encounter: Payer: Self-pay | Admitting: Internal Medicine

## 2010-11-23 ENCOUNTER — Ambulatory Visit (INDEPENDENT_AMBULATORY_CARE_PROVIDER_SITE_OTHER): Payer: Medicare Other | Admitting: Internal Medicine

## 2010-11-23 VITALS — BP 134/76 | HR 89 | Temp 98.2°F | Ht 69.5 in | Wt 274.8 lb

## 2010-11-23 DIAGNOSIS — K219 Gastro-esophageal reflux disease without esophagitis: Secondary | ICD-10-CM

## 2010-11-23 DIAGNOSIS — I10 Essential (primary) hypertension: Secondary | ICD-10-CM

## 2010-11-23 MED ORDER — FUROSEMIDE 20 MG PO TABS: 20.0000 mg | ORAL_TABLET | Freq: Every day | ORAL | Status: DC | PRN

## 2010-11-23 MED ORDER — LOSARTAN POTASSIUM 100 MG PO TABS: 100.0000 mg | ORAL_TABLET | Freq: Every day | ORAL | Status: DC

## 2010-11-23 NOTE — Progress Notes (Signed)
Subjective:    Patient ID: Tamara Morales, female    DOB: 11-Feb-1931, 77 y.o.   MRN: [id number]  HPI  here for follow up - reviewed chronic medical issues:  hypothyroid - related to surg resection for goiter when hyperparathyroid glands (3 of 4) removed - no hx thyroid cancer - feels fatigued and c/o brittle nails when level is off - chronic weight gain - no bowel changes  HTN - diovan rx'd 04/2010 but caused nausea and weak feeling - changed to losartan 08/2010 with hctz as needed, titrated to max but still high in AM - reports compliance with ongoing medical treatment and denies adverse side effects related to current therapy: no CP, edema or headache; no hx CVA or CAD (neg cath 03/2010 hosp for CP) -   dyslipidemia - prev on lipitor but stopped 07/2009 (pt unclear as to why) - takes fish oil - denies muscle weakenss or GI upset -  GERD with reflux - reports compliance with ongoing medical treatment and no recent change in medication (on pantopr.otazole). denies adverse side effects related to current therapy.    OA, B knees - no other joints affected - s/p TKR for each - keeps active with walking - no chronic pain meds needed  depression - working with counselor susan bond due to life stressors - feels improved - wishes to avoid meds if poss -   obesity - fustrated with weight gain since spring 2011 - cutting calories but would like more diet help  Past Medical History  Diagnosis Date  . Morbid obesity   . MIGRAINE HEADACHE   . ARTHRITIS, KNEES, BILATERAL   . BREAST CANCER, HX OF 2000  . DEPRESSION   . GERD   . HYPERLIPIDEMIA   . HYPERTENSION   . HYPOTHYROIDISM     Review of Systems  Eyes: Negative for visual disturbance.  Respiratory: Negative for cough and shortness of breath.   Cardiovascular: Negative for chest pain and leg swelling.  Neurological: Negative for weakness and headaches.      Objective:   Physical Exam BP 134/76  Pulse 89  Temp(Src) 98.2 F (36.8  C) (Oral)  Ht 5' 9.5" (1.765 m)  Wt 274 lb 12.8 oz (124.648 kg)  BMI 40.00 kg/m2  SpO2 96% Physical Exam  Constitutional: She is obese; oriented to person, place, and time. She appears well-developed and well-nourished. No distress. spouse at side Neck: Normal range of motion. Neck supple. No JVD present. No thyromegaly present.  Cardiovascular: Normal rate, regular rhythm and normal heart sounds.  No murmur heard. trace edema BLE. Pulmonary/Chest: Effort normal and breath sounds normal. No respiratory distress. She has no wheezes.  Neurological: She is alert and oriented to person, place, and time. No cranial nerve deficit. Coordination normal.  Psychiatric: She has a normal mood and affect. Her behavior is normal. Judgment and thought content normal.   Lab Results  Component Value Date   WBC 7.3 08/23/2010   HGB 14.9 08/23/2010   HCT 43.4 08/23/2010   PLT 178.0 08/23/2010   CHOL 189 08/23/2010   TRIG 167.0* 08/23/2010   HDL 43.20 08/23/2010   ALT 14 08/23/2010   AST 22 08/23/2010   NA 142 08/23/2010   K 3.9 08/23/2010   CL 102 08/23/2010   CREATININE 0.8 08/23/2010   BUN 13 08/23/2010   CO2 32 08/23/2010   TSH 2.55 08/23/2010   INR 1.02 03/20/2010   Wt Readings from Last 3 Encounters:  11/23/10 274 lb 12.8  oz (124.648 kg)  10/18/10 276 lb (125.193 kg)  08/23/10 277 lb 6.4 oz (125.828 kg)        Assessment & Plan:  See problem list. Medications and labs reviewed today.

## 2010-11-23 NOTE — Patient Instructions (Signed)
It was good to see you today. Change losartan to $RemoveBef'100mg'LLKhaUTfAW$  tabs and use lasix as needed for swelling - Your prescription(s) have been submitted to your pharmacy. Please take as directed and contact our office if you believe you are having problem(s) with the medication(s). Other Medications reviewed, no changes at this time. Please schedule followup in 3-4 months, call sooner if problems.

## 2010-11-23 NOTE — Assessment & Plan Note (Signed)
Home bp log reviewed - variable BP Prior poor tolerance of diovan but tolerating losartan without adv SE- Titrate to BP goal at this time, use lasix as needed BP Readings from Last 3 Encounters:  11/23/10 134/76  10/18/10 134/86  08/23/10 120/82

## 2010-11-24 ENCOUNTER — Ambulatory Visit (INDEPENDENT_AMBULATORY_CARE_PROVIDER_SITE_OTHER): Payer: Medicare Other | Admitting: Licensed Clinical Social Worker

## 2010-11-24 DIAGNOSIS — F331 Major depressive disorder, recurrent, moderate: Secondary | ICD-10-CM

## 2010-11-25 NOTE — Assessment & Plan Note (Signed)
Wt Readings from Last 3 Encounters:  11/23/10 274 lb 12.8 oz (124.648 kg)  10/18/10 276 lb (125.193 kg)  08/23/10 277 lb 6.4 oz (125.828 kg)   Education provided today on diet and exercise needs for optimization of weight and health

## 2010-11-25 NOTE — Assessment & Plan Note (Signed)
Continue pantoprotazole, omeprazole ineffective previously

## 2010-12-08 ENCOUNTER — Ambulatory Visit (INDEPENDENT_AMBULATORY_CARE_PROVIDER_SITE_OTHER): Payer: Medicare Other | Admitting: Licensed Clinical Social Worker

## 2010-12-08 DIAGNOSIS — F331 Major depressive disorder, recurrent, moderate: Secondary | ICD-10-CM

## 2010-12-13 ENCOUNTER — Other Ambulatory Visit: Payer: Self-pay | Admitting: *Deleted

## 2010-12-13 MED ORDER — ZOLPIDEM TARTRATE 10 MG PO TABS: 10.0000 mg | ORAL_TABLET | Freq: Every evening | ORAL | Status: DC | PRN

## 2010-12-13 NOTE — Telephone Encounter (Signed)
[facility] @ [phone].Marland KitchenMarland Kitchen6/26/12@12 :57pm/LMB

## 2010-12-28 ENCOUNTER — Ambulatory Visit (INDEPENDENT_AMBULATORY_CARE_PROVIDER_SITE_OTHER): Payer: Medicare Other | Admitting: Licensed Clinical Social Worker

## 2010-12-28 DIAGNOSIS — F331 Major depressive disorder, recurrent, moderate: Secondary | ICD-10-CM

## 2010-12-29 ENCOUNTER — Ambulatory Visit: Payer: Medicare Other | Admitting: Licensed Clinical Social Worker

## 2011-01-02 ENCOUNTER — Inpatient Hospital Stay (HOSPITAL_COMMUNITY)
Admission: EM | Admit: 2011-01-02 | Discharge: 2011-01-03 | DRG: 313 | Disposition: A | Discharge status: Home / Self Care | Payer: Medicare Other | Attending: Cardiovascular Disease | Admitting: Cardiovascular Disease

## 2011-01-02 ENCOUNTER — Emergency Department (HOSPITAL_COMMUNITY): Payer: Medicare Other

## 2011-01-02 ENCOUNTER — Encounter (HOSPITAL_COMMUNITY): Payer: Self-pay | Admitting: Radiology

## 2011-01-02 DIAGNOSIS — E213 Hyperparathyroidism, unspecified: Secondary | ICD-10-CM | POA: Diagnosis present

## 2011-01-02 DIAGNOSIS — Z853 Personal history of malignant neoplasm of breast: Secondary | ICD-10-CM

## 2011-01-02 DIAGNOSIS — R079 Chest pain, unspecified: Secondary | ICD-10-CM

## 2011-01-02 DIAGNOSIS — F3289 Other specified depressive episodes: Secondary | ICD-10-CM | POA: Diagnosis present

## 2011-01-02 DIAGNOSIS — R0789 Other chest pain: Principal | ICD-10-CM | POA: Diagnosis present

## 2011-01-02 DIAGNOSIS — I10 Essential (primary) hypertension: Secondary | ICD-10-CM | POA: Diagnosis present

## 2011-01-02 DIAGNOSIS — Z7982 Long term (current) use of aspirin: Secondary | ICD-10-CM

## 2011-01-02 DIAGNOSIS — F329 Major depressive disorder, single episode, unspecified: Secondary | ICD-10-CM | POA: Diagnosis present

## 2011-01-02 DIAGNOSIS — E785 Hyperlipidemia, unspecified: Secondary | ICD-10-CM | POA: Diagnosis present

## 2011-01-02 DIAGNOSIS — E039 Hypothyroidism, unspecified: Secondary | ICD-10-CM | POA: Diagnosis present

## 2011-01-02 DIAGNOSIS — E876 Hypokalemia: Secondary | ICD-10-CM | POA: Diagnosis present

## 2011-01-02 DIAGNOSIS — E669 Obesity, unspecified: Secondary | ICD-10-CM | POA: Diagnosis present

## 2011-01-02 LAB — CK TOTAL AND CKMB (NOT AT ARMC)
CK, MB: 2 ng/mL (ref 0.3–4.0)
Relative Index: INVALID (ref 0.0–2.5)
Relative Index: 1.9 (ref 0.0–2.5)
Total CK: 107 U/L (ref 7–177)

## 2011-01-02 LAB — TROPONIN I
Troponin I: <0.3 ng/mL (ref <0.30)
Troponin I: 0.33 ng/mL (ref <0.30)

## 2011-01-02 LAB — CBC
HCT: 39.7 % (ref 36.0–46.0)
MCV: 90.2 fL (ref 78.0–100.0)
Platelets: 169 10*3/uL (ref 150–400)
RBC: 4.4 MIL/uL (ref 3.87–5.11)
WBC: 6.7 10*3/uL (ref 4.0–10.5)

## 2011-01-02 LAB — COMPREHENSIVE METABOLIC PANEL
AST: 13 U/L (ref 0–37)
Albumin: 2.9 g/dL — ABNORMAL LOW (ref 3.5–5.2)
Chloride: 104 mEq/L (ref 96–112)
Creatinine, Ser: 0.7 mg/dL (ref 0.50–1.10)
Total Bilirubin: 0.4 mg/dL (ref 0.3–1.2)
Total Protein: 6.3 g/dL (ref 6.0–8.3)

## 2011-01-02 LAB — DIFFERENTIAL
Eosinophils Absolute: 0.2 10*3/uL (ref 0.0–0.7)
Lymphocytes Relative: 18 % (ref 12–46)
Lymphs Abs: 1.2 10*3/uL (ref 0.7–4.0)
Neutrophils Relative %: 72 % (ref 43–77)

## 2011-01-02 LAB — URINALYSIS, ROUTINE W REFLEX MICROSCOPIC
Glucose, UA: NEGATIVE mg/dL
Hgb urine dipstick: NEGATIVE
Protein, ur: NEGATIVE mg/dL
Specific Gravity, Urine: 1.014 (ref 1.005–1.030)
pH: 5.5 (ref 5.0–8.0)

## 2011-01-02 LAB — D-DIMER, QUANTITATIVE: D-Dimer, Quant: 1.05 ug/mL-FEU — ABNORMAL HIGH (ref 0.00–0.48)

## 2011-01-02 MED ORDER — IOHEXOL 350 MG/ML SOLN: 50.0000 mL | Freq: Once | INTRAVENOUS | Status: AC | PRN

## 2011-01-03 ENCOUNTER — Telehealth: Payer: Self-pay | Admitting: *Deleted

## 2011-01-03 DIAGNOSIS — M79609 Pain in unspecified limb: Secondary | ICD-10-CM

## 2011-01-03 LAB — HEPARIN LEVEL (UNFRACTIONATED): Heparin Unfractionated: 0.25 IU/mL — ABNORMAL LOW (ref 0.30–0.70)

## 2011-01-03 LAB — CBC
HCT: 39.4 % (ref 36.0–46.0)
Hemoglobin: 13 g/dL (ref 12.0–15.0)
RBC: 4.26 MIL/uL (ref 3.87–5.11)
WBC: 5.9 10*3/uL (ref 4.0–10.5)

## 2011-01-03 LAB — HEMOGLOBIN A1C
Hgb A1c MFr Bld: 5.9 % — ABNORMAL HIGH (ref <5.7)
Mean Plasma Glucose: 123 mg/dL — ABNORMAL HIGH (ref <117)

## 2011-01-03 NOTE — Telephone Encounter (Signed)
Pt states that she was released from hospital this morning and was told to make a f/u appointment w/PCP [POx in 80s per patient] Patient wanting appointment today or tomorrow: scheduled for tomorrow 01/04/11 @ 11:15am Pt Not having any chest pain and/or SOB; just concerned about Pulse Oximetry readings.

## 2011-01-03 NOTE — Telephone Encounter (Signed)
Ok thanks 

## 2011-01-04 ENCOUNTER — Encounter: Payer: Self-pay | Admitting: Internal Medicine

## 2011-01-04 ENCOUNTER — Ambulatory Visit (INDEPENDENT_AMBULATORY_CARE_PROVIDER_SITE_OTHER): Payer: Medicare Other | Admitting: Internal Medicine

## 2011-01-04 DIAGNOSIS — J209 Acute bronchitis, unspecified: Secondary | ICD-10-CM

## 2011-01-04 DIAGNOSIS — G4733 Obstructive sleep apnea (adult) (pediatric): Secondary | ICD-10-CM

## 2011-01-04 DIAGNOSIS — R079 Chest pain, unspecified: Secondary | ICD-10-CM

## 2011-01-04 MED ORDER — AZITHROMYCIN 250 MG PO TABS: 250.0000 mg | ORAL_TABLET | Freq: Every day | ORAL | Status: AC

## 2011-01-04 NOTE — Patient Instructions (Signed)
It was good to see you today. We have reviewed your hospital records including labs and tests today Zpak for bronchitis symptoms - Your prescription(s) have been submitted to your pharmacy. Please take as directed and contact our office if you believe you are having problem(s) with the medication(s). we'll make referral to sleep specialist ([facility]) to evaluate for sleep problems and low oxygen during sleep (possible sleep apnea) . Our office will contact you regarding appointment(s) once made.

## 2011-01-04 NOTE — Progress Notes (Signed)
  Subjective:    Patient ID: Tamara Morales, female    DOB: Nov 16, 1930, 77 y.o.   MRN: [id number]  HPI Here for ER/hosp follow up - To ER 7/16 for chest pain - records/labs/test reviewed: elev Ddimer -> CT chest angio neg for PE; also no effusion or ASD, mild CM cardaic enz negative, EKG ok S/p BLE ven doppler 7/17 - no DVT or other problems Dc 7/17 dx with bronchitis - no antibiotics rx Also noted desat to 80s during sleep - pt with poor, freq interrupted sleep - always tired - ?OSA Has felt fatigued and "ill/achy" for last  3 weeks with dry cough and PND - ?viral URI  Past Medical History  Diagnosis Date  . Morbid obesity   . MIGRAINE HEADACHE   . ARTHRITIS, KNEES, BILATERAL   . DEPRESSION   . GERD   . HYPERLIPIDEMIA   . HYPERTENSION   . Breast ca 2000    s/p R lumpectomy and XRT  . DJD (degenerative joint disease) of knee   . Urge incontinence   . HYPOTHYROIDISM     postsurgical   Review of Systems  Constitutional: Positive for fatigue. Negative for fever.  Respiratory: Positive for cough. Negative for shortness of breath and wheezing.       Objective:   Physical Exam BP 130/84  Pulse 87  Temp(Src) 98.2 F (36.8 C) (Oral)  Ht 5' 4.5" (1.638 m)  Wt 276 lb (125.193 kg)  BMI 46.64 kg/m2  SpO2 94% Physical Exam  Constitutional: She is obese, oriented to person, place, and time. She appears well-developed and well-nourished. Mildly ill appearing.  Mouth/Throat: Oropharynx is clear and moist. No oropharyngeal exudate, but moderate erythema.  Eyes: Conjunctivae and EOM are normal. Pupils are equal, round, and reactive to light. No scleral icterus.  Neck: Thick, normal range of motion. Neck supple. No JVD present. No thyromegaly present.  Cardiovascular: Normal rate, regular rhythm and normal heart sounds.  No murmur heard. chronic 1+ BLE edema. Pulmonary/Chest: Effort normal and breath sounds slightly diminished. No respiratory distress. She has no  wheezes. Psychiatric: She has a normal mood and affect. Her behavior is normal. Judgment and thought content normal.   Lab Results  Component Value Date   WBC 5.9 01/03/2011   HGB 13.0 01/03/2011   HCT 39.4 01/03/2011   PLT 163 01/03/2011   CHOL 189 08/23/2010   TRIG 167.0* 08/23/2010   HDL 43.20 08/23/2010   ALT 10 01/02/2011   AST 13 01/02/2011   NA 140 01/02/2011   K 3.7 01/02/2011   CL 104 01/02/2011   CREATININE 0.70 01/02/2011   BUN 14 01/02/2011   CO2 27 01/02/2011   TSH 1.277 01/02/2011   INR 1.02 03/20/2010   HGBA1C 5.9* 01/02/2011        Assessment & Plan:  chest pain - 01/02/11 hosp eval reviewed - no PE or effusion on CT - will tx Zpack for infectious bronchitis, recommend antihistamine for PND symptoms   suspected OSA - body habitus typical and reported desat during sleep in hosp to 80s; chronic fatigue and interrupted sleep - refer to pulm for sleep eval and treatment as needed

## 2011-01-05 NOTE — H&P (Signed)
Tamara Morales, MCILVAIN NO.:  [id number]  MEDICAL RECORD NO.:  [id number]  LOCATION:  76                         FACILITY:  [facility]  PHYSICIAN:  Juanda Bond. Burt Knack, MD  DATE OF BIRTH:  1931-03-14  DATE OF ADMISSION:  01/02/2011 DATE OF DISCHARGE:                             HISTORY & PHYSICAL   PRIMARY CARE PHYSICIAN:  Valerie A. Asa Lente, MD  PRIMARY CARDIOLOGIST:  Shaune Pascal. Bensimhon, MD (saw her in the hospital in October 2011).  CHIEF COMPLAINT:  Chest pain.  HISTORY OF PRESENT ILLNESS:  Tamara Morales is a 77 year old female with no history of coronary artery disease.  She slept poorly last p.m. secondary to general malaise without specific complaint.  She just "did not feel right."  At approximately 5 a.m. today, she had onset of substernal chest pain described as tightness and burning.  It reached a 7/10.  Her symptoms did not resolve spontaneously, so she called and took aspirin 81 mg x4 as directed with some relief.  EMS came and gave her sublingual nitroglycerin x1 which she did not feel helped.  In the ambulance and in the emergency room, her pain was coming and going.  She became nauseated in the emergency room and received Zofran.  After the Zofran, her pain completely resolved, but her nausea continued, so she received Phenergan.  After the Phenergan, she complained of some dizziness and lightheaded feeling.  Of note, she was complaining of right arm and leg paresthesias during the chest pain which she felt was a new symptom for her, but this has resolved as well.  PAST MEDICAL HISTORY: 1. History of chest pain in October 2011 when a cath showed normal     coronary arteries and an EF of 75%. 2. Hypertension. 3. Morbid obesity with a body mass index of 47. 4. Hyperlipidemia. 5. History of depression. 6. History of hypokalemia. 7. History of hyperparathyroidism, status post surgery with subsequent     hypothyroidism. 8. History of breast cancer,  status post lumpectomy and radiation. 9. Gastroesophageal reflux disease. 10.Osteoarthritis in her knees.  SURGICAL HISTORY:  She is status post cardiac catheterization as well as breast lumpectomy, cholecystectomy, hysterectomy, total knee replacement, parathyroidectomy and partial thyroidectomy, and tonsillectomy.  ALLERGIES:  She has been allergic or intolerant in the past to: 1. DARVON. 2. PENTAZOCINE. 3. CODEINE. 4. BETA-BLOCKERS which caused bradycardia and depression. 5. CIPRO. 6. EPINEPHRINE.  CURRENT MEDICATIONS: 1. Aspirin 81 mg a day. 2. Synthroid 125 mcg daily. 3. Potassium 10 mEq 2 tablets daily. 4. Protonix 40 mg a day. 5. Multivitamins daily. 6. Hydrochlorothiazide 25 mg a day. 7. Fish oil 1000 units 2 capsules daily. 8. Ambien 10 mg nightly.  SOCIAL HISTORY:  She lives in [place] with family nearby.  She is retired.  She has a remote history of tobacco use, but quit more than 20 years ago with less than 10-pack-year history and denies alcohol or drug abuse.  FAMILY HISTORY:  Both her parents were in their 14s when they died and neither of her parents nor any siblings have premature coronary artery disease.  REVIEW OF SYSTEMS:  She has not had any recent illness, fevers, or chills.  She has had  a light nonproductive cough.  There has been no wheezing.  Her reflux symptoms bother her occasionally, but there has been no melena or abdominal pain.  She has chronic arthralgias and joint pains.  Because of previous fractures and knee replacement, she walks with a cane.  She does not exercise.  She denies dysuria.  Full 14-point review of systems is otherwise negative except as stated in the HPI.  PHYSICAL EXAMINATION:  VITAL SIGNS:  Temperature 97.8, blood pressure 146/77, heart rate 77, respiratory rate 16, and O2 saturation 97% on room air. GENERAL:  She is a well-developed, obese, elderly white female in no acute distress at rest. HEENT:  Normal. NECK:   There is no lymphadenopathy, thyromegaly, bruit, or JVD noted. CARDIOVASCULAR:  Her heart is regular in rate and rhythm with an S1 and S2 and no significant murmur, rub, or gallop is noted.  Distal pulses are intact in all 4 extremities. LUNGS:  Essentially clear to auscultation bilaterally. SKIN:  No rashes or lesions are noted. ABDOMEN:  Soft and nontender with active bowel sounds. EXTREMITIES:  There is no cyanosis, clubbing, or edema noted. MUSCULOSKELETAL:  There is no joint deformity or effusions and no spine or CVA tenderness. NEURO:  She is alert and oriented with cranial nerves II-XII grossly intact.  Chest x-ray:  Enlargement of cardiac silhouette with pulmonary vascular congestion and mild chronic bronchitic changes, but no acute abnormalities.  EKG:  Sinus rhythm, rate 89 with multiple PVCs.  LABORATORY VALUES:  Hemoglobin 13.6, hematocrit 39.7, WBC 6.7, and platelets 169.  Sodium 140, potassium 3.7, chloride 104, CO2 of 27, BUN 14, creatinine 0.7, and glucose 114.  CK-MB negative x2.  Initial troponin less than 0.3 with a repeat troponin of 0.33.  Urinalysis is negative.  IMPRESSION:  Tamara Morales was seen today by Dr. Burt Knack, the patient evaluated and the data reviewed.  [name] has chest pain and a mildly elevated troponin.  She had a cath within the last 12 months showing no obstructive coronary artery disease.  She is at high risk of DVT and PE when you consider her age, obesity, and limited mobility.  We will start her on IV heparin.  We will continue her on aspirin and check bilateral lower extremity Dopplers as well as a D- dimer.  If her D-dimer is elevated, she needs a chest angiogram.  We will cycle cardiac enzymes.  Currently, no cath is planned.     Rosaria Ferries, PA-C   ______________________________ Juanda Bond. Burt Knack, MD    RB/MEDQ  D:  01/02/2011  T:  01/03/2011  Job:  [id number]  Electronically Signed by Rosaria Ferries PA-C on 01/03/2011  05:09:30 PM Electronically Signed by Sherren Mocha MD on 01/05/2011 12:43:23 AM

## 2011-01-09 ENCOUNTER — Telehealth: Payer: Self-pay

## 2011-01-09 NOTE — Telephone Encounter (Signed)
Left message on machine for pt to return my call  

## 2011-01-09 NOTE — Telephone Encounter (Signed)
Use doxy as different antibiotics - erx done -

## 2011-01-09 NOTE — Telephone Encounter (Signed)
Pt called stating she was recently seen and treated for Bronchitis with a Zpak, however she will be taking the last tablet today and her sxs have not improved. Pt is requesting either a refill or alternate medication, please advise.

## 2011-01-10 MED ORDER — DOXYCYCLINE HYCLATE 100 MG PO TABS: 100.0000 mg | ORAL_TABLET | Freq: Two times a day (BID) | ORAL | Status: AC

## 2011-01-10 NOTE — Telephone Encounter (Signed)
Pt advised.

## 2011-01-11 ENCOUNTER — Ambulatory Visit: Payer: Medicare Other | Admitting: Licensed Clinical Social Worker

## 2011-01-15 ENCOUNTER — Emergency Department (HOSPITAL_COMMUNITY): Payer: Medicare Other

## 2011-01-15 ENCOUNTER — Telehealth: Payer: Self-pay | Admitting: Internal Medicine

## 2011-01-15 ENCOUNTER — Emergency Department (HOSPITAL_COMMUNITY)
Admission: EM | Admit: 2011-01-15 | Discharge: 2011-01-15 | Disposition: A | Discharge status: Home / Self Care | Payer: Medicare Other | Attending: Emergency Medicine | Admitting: Emergency Medicine

## 2011-01-15 DIAGNOSIS — E785 Hyperlipidemia, unspecified: Secondary | ICD-10-CM | POA: Insufficient documentation

## 2011-01-15 DIAGNOSIS — I517 Cardiomegaly: Secondary | ICD-10-CM | POA: Insufficient documentation

## 2011-01-15 DIAGNOSIS — I4949 Other premature depolarization: Secondary | ICD-10-CM | POA: Insufficient documentation

## 2011-01-15 DIAGNOSIS — R059 Cough, unspecified: Secondary | ICD-10-CM | POA: Insufficient documentation

## 2011-01-15 DIAGNOSIS — R0902 Hypoxemia: Secondary | ICD-10-CM | POA: Insufficient documentation

## 2011-01-15 DIAGNOSIS — R0989 Other specified symptoms and signs involving the circulatory and respiratory systems: Secondary | ICD-10-CM | POA: Insufficient documentation

## 2011-01-15 DIAGNOSIS — R0982 Postnasal drip: Secondary | ICD-10-CM | POA: Insufficient documentation

## 2011-01-15 DIAGNOSIS — G4730 Sleep apnea, unspecified: Secondary | ICD-10-CM | POA: Insufficient documentation

## 2011-01-15 DIAGNOSIS — I10 Essential (primary) hypertension: Secondary | ICD-10-CM | POA: Insufficient documentation

## 2011-01-15 DIAGNOSIS — R05 Cough: Secondary | ICD-10-CM | POA: Insufficient documentation

## 2011-01-15 DIAGNOSIS — E039 Hypothyroidism, unspecified: Secondary | ICD-10-CM | POA: Insufficient documentation

## 2011-01-15 DIAGNOSIS — R0602 Shortness of breath: Secondary | ICD-10-CM | POA: Insufficient documentation

## 2011-01-15 LAB — CBC
Hemoglobin: 13.8 g/dL (ref 12.0–15.0)
MCH: 30.9 pg (ref 26.0–34.0)
MCV: 89 fL (ref 78.0–100.0)
RBC: 4.47 MIL/uL (ref 3.87–5.11)
WBC: 7 10*3/uL (ref 4.0–10.5)

## 2011-01-15 LAB — POCT I-STAT, CHEM 8
BUN: 12 mg/dL (ref 6–23)
Chloride: 103 mEq/L (ref 96–112)
Creatinine, Ser: 0.8 mg/dL (ref 0.50–1.10)
Sodium: 141 mEq/L (ref 135–145)
TCO2: 26 mmol/L (ref 0–100)

## 2011-01-15 LAB — DIFFERENTIAL
Basophils Relative: 0 % (ref 0–1)
Lymphs Abs: 1 10*3/uL (ref 0.7–4.0)
Monocytes Relative: 7 % (ref 3–12)
Neutro Abs: 5.3 10*3/uL (ref 1.7–7.7)
Neutrophils Relative %: 76 % (ref 43–77)

## 2011-01-15 LAB — PRO B NATRIURETIC PEPTIDE: Pro B Natriuretic peptide (BNP): 266.3 pg/mL (ref 0–450)

## 2011-01-15 NOTE — Telephone Encounter (Signed)
Dr Orlie Dakin of ER called. Patient has pending appt with Dr. Gwenette Greet for OSA but has had one admit recently for cp (PE ruled out) and now another er visit. He states pulse ox dropped to 84% when patient slept in ER but normal when awake. HE feels this is due to sleep apnea and denies other issues that might need admission. Still prefers patient be seen acutely this week in our office just to make sure everything is okay. See [place] or another MD or TP please. I told him that our office would call patient for appt this week

## 2011-01-16 ENCOUNTER — Telehealth: Payer: Self-pay

## 2011-01-16 NOTE — Telephone Encounter (Signed)
[place] with no openings, TP not in office.  Pt has sleep consult scheduled with [facility] 8.14.12 but per message needs earlier appt.  [facility] with consult openings on 7.31.12 > appt moved to this time.  Pt okay with this date and time.  appt w/ [place] cancelled.  Pt aware to arrive 61mins early and bring ins cards.

## 2011-01-16 NOTE — Discharge Summary (Signed)
NAMEMarland Kitchen  Tamara Morales, Tamara Morales NO.:  [id number]  MEDICAL RECORD NO.:  [id number]  LOCATION:  7673                         FACILITY:  [facility]  PHYSICIAN:  Thayer Headings, M.D. DATE OF BIRTH:  [birth year]  DATE OF ADMISSION:  01/02/2011 DATE OF DISCHARGE:  01/03/2011                              DISCHARGE SUMMARY   PRIMARY CARDIOLOGIST:  Shaune Pascal. Bensimhon, MD  PRIMARY CARE PROVIDER:  Jannifer Rodney. Asa Lente, MD  DISCHARGE DIAGNOSIS:  Chest pain.  SECONDARY DIAGNOSES: 1. Hypertension. 2. Hyperlipidemia. 3. Obesity. 4. Hypokalemia. 5. Hypothyroidism. 6. Hyperparathyroidism. 7. Depression. 8. History of normal coronaries by catheterization in October 2011. 9. Status post thyroidectomy. 10.Status post parathyroidectomy.  ALLERGIES:  BETA-BLOCKERS.  PROCEDURES: 1. CT angiography of the chest with contrast performed on January 02, 2011, showing no evidence of PE and mild cardiomegaly. 2. Bilateral lower extremity ultrasound showing no evidence of DVT     bilaterally.  HISTORY OF PRESENT ILLNESS:  This is a 77 year old female with prior history of chest pain status post cardiac catheterization in October 2011 revealing normal coronary arteries and normal LV function.  The patient was in her usual state of health until approximately 5 a.m. on the day of admission when she had sudden onset of substernal chest discomfort and tightness and burning that was minimally relieved with aspirin.  EMS was called and she was given supple nitroglycerin without relief and then subsequently Zofran with complete relief.  She was taken to the [facility] ED where initial point-of-care markers were negative, however, it rose slightly to 0.33.  She was admitted for further evaluation.  HOSPITAL COURSE:  Subsequent troponin evaluation was less than 0.3 and it was felt that troponin elevation was nonspecific.  She had no recurrent chest pain.  Given sedentary lifestyle, she was felt to  be high risk for DVT and PE, and a D-dimer was checked and elevated at 1.05.  This was followed by CT angiography of the chest, which was negative for pulmonary embolus and subsequently lower extremity ultrasounds which were negative for DVT.  The patient has had no recurrent symptoms and will be discharged home today in good condition.  DISCHARGE LABORATORY DATA:  Hemoglobin 13.0, hematocrit 39.4, WBC 5.9, and platelets 163.  D-dimer 1.05.  Sodium 140, potassium 3.7, chloride 104, CO2 of 27, BUN 14, creatinine 0.70, glucose 114, total bilirubin 0.4, alkaline phosphatase 86, AST 13, ALT 10, total protein 6.3, albumin 2.9, calcium 8.1.  Hemoglobin A1c 5.9.  CK 66, MB 2.1, and troponin I less than 0.30.  TSH 1.277.  Urinalysis is negative.  DISPOSITION:  The patient will be discharged home today in good condition.  FOLLOWUP PLANS AND APPOINTMENTS:  The patient will follow up with Dr. Gwendolyn Grant as previously scheduled.  DISCHARGE MEDICATIONS: 1. Ambien 10 mg nightly. 2. Aspirin 81 mg daily. 3. Fish oil 1000 units 2 capsules daily. 4. Hydrochlorothiazide 25 mg daily. 5. Multivitamin 1 tablet daily. 6. Potassium chloride 10 mEq 2 tablets daily. 7. Protonix 40 mg daily. 8. Synthroid 125 mcg daily.  OUTSTANDING LABORATORY DATA AND STUDIES:  None.  DURATION OF DISCHARGE ENCOUNTER:  35 minutes including physician time.  Murray Hodgkins, ANP   ______________________________ Thayer Headings, M.D.    CB/MEDQ  D:  01/03/2011  T:  01/03/2011  Job:  [id number]  cc:   Mateo Flow A. Asa Lente, MD  Electronically Signed by Murray Hodgkins ANP on 01/09/2011 10:36:15 AM Electronically Signed by Mertie Moores M.D. on 01/16/2011 12:10:49 PM

## 2011-01-16 NOTE — Telephone Encounter (Signed)
[facility] Triage Call Report Triage Record Num: [id number] Operator: Sheryn Bison Patient Name: Tamara Morales Call Date & Time: 01/15/2011 6:58:11AM Patient Phone: [phone] PCP: Gwendolyn Grant Patient Gender: Female PCP Fax : [phone] Patient DOB: 09/21/1930 Practice Name: [facility] Reason for Call: Lives at [facility], SOB onset 01/02/11, also then had tingling, chest pain, was seen at [facility], found bronchitis, was to see PCP for f/u. Saw PCP on 7/17, prescribed Z-pack, did not help, called office 7/24 prescribed doxycycline, stopped it b/c became nauseated with diarrhea, was planning on calling PCP next week. Last night 7/28 had chest discomfort, afebrile, was checked by facility nurse, was put on O2, but this am still nausated, no vomiting, no pain this am, but feels tight in chest, lightheaded, unsteady on legs, weak RN advised pt to call EMS 911 for transport to [facility] ER. Voiced understanding. Protocol(s) Used: Chest Pain Recommended Outcome per Protocol: Activate EMS 911 Reason for Outcome: New or worsening signs and symptoms that may indicate shock Care Advice: ~ 01/15/2011 7:08:05AM Page 1 of 1 [id number]

## 2011-01-16 NOTE — Telephone Encounter (Signed)
Noted thanks °

## 2011-01-17 ENCOUNTER — Ambulatory Visit (INDEPENDENT_AMBULATORY_CARE_PROVIDER_SITE_OTHER): Payer: Medicare Other | Admitting: Pulmonary Disease

## 2011-01-17 ENCOUNTER — Encounter: Payer: Self-pay | Admitting: Pulmonary Disease

## 2011-01-17 VITALS — BP 140/80 | HR 84 | Temp 99.2°F | Ht 65.0 in | Wt 280.4 lb

## 2011-01-17 DIAGNOSIS — G4733 Obstructive sleep apnea (adult) (pediatric): Secondary | ICD-10-CM

## 2011-01-17 NOTE — Patient Instructions (Signed)
Will schedule sleep study Will call to schedule follow up after sleep study reviewed 

## 2011-01-17 NOTE — Assessment & Plan Note (Signed)
She has snoring, sleep disruption, and daytime sleepiness.  She has recent hospitalization and found to have oxygen desaturation while asleep.  She has a history of hypertension.  I am concerned that she may have sleep apnea.  I have explained how sleep apnea can affect the patient's health.  Driving precautions and importance of weight loss were discussed.  Treatment options for sleep apnea were reviewed.  To further assess will arrange for in-lab sleep study.  Will start study w/o supplemental oxygen, and then add as needed.

## 2011-01-17 NOTE — Progress Notes (Signed)
Subjective:    Patient ID: Tamara Morales, female    DOB: 1930/07/04, 77 y.o.   MRN: [id number]  HPI 77 yo female for sleep evaluation.  She was recently hospitalized for chest pain.  She was noted to have trouble with he oxygen while asleep.  She does snore, and has trouble feeling sleepy during the day.  She has noticed trouble with her sleep for years.  She snores, and has trouble sleeping when flat.  She has been told she stops breathing while asleep, and makes funny noises when asleep.  She does also talk occasionally when asleep.  She takes ambien at 1130 pm.  She goes to bed at midnight, and usually falls asleep quickly.  She is up after about 4 hours, and then uses the bathroom.  She wakes up out of bed at 9 am.  She feels tired during the day, and occasionally gets headaches in the morning.  She is not using anything to help her stay awake during the day.  She was started on oxygen from the hospital at 2 liters.  She has been using this at night.  She has been sleeping better and feeling better during the day since she has been using oxygen.  The patient denies sleep walking, bruxism, or nightmares.  There is no history of restless legs.  The patient denies sleep hallucinations, sleep paralysis, or cataplexy.  No smoking, no etoh  Epworth score is 10 out of 24.  Past Medical History  Diagnosis Date  . Morbid obesity   . MIGRAINE HEADACHE   . ARTHRITIS, KNEES, BILATERAL   . DEPRESSION   . GERD   . HYPERLIPIDEMIA   . HYPERTENSION   . Breast ca 2000    s/p R lumpectomy and XRT  . DJD (degenerative joint disease) of knee   . Urge incontinence   . HYPOTHYROIDISM     postsurgical     Family History  Problem Relation Age of Onset  . Emphysema Sister      History   Social History  . Marital Status: Married    Spouse Name: N/A    Number of Children: N/A  . Years of Education: N/A   Occupational History  . RETIRED     worked on office   Social History Main  Topics  . Smoking status: Former Smoker -- 2 years    Types: Cigarettes  . Smokeless tobacco: Not on file   Comment: Quit in late 20's  . Alcohol Use: No     rare  . Drug Use: No  . Sexually Active: Not on file   Other Topics Concern  . Not on file   Social History Narrative  . No narrative on file     Allergies  Allergen Reactions  . Beta Adrenergic Blockers   . Ciprofloxacin     REACTION: edgy  . Codeine     REACTION: Nausea$RemoveBeforeDE' \\T'xasndoiDGmmucrs$ \ vomitting  . Epinephrine     REACTION: rapid pulse, sweats  . Propoxyphene Hcl     REACTION: Nausea$RemoveBeforeDE' \\T'UjozotGnNTyapEZ$ \ vomitting     Outpatient Prescriptions Prior to Visit  Medication Sig Dispense Refill  . aspirin 81 MG tablet Take 81 mg by mouth daily.        . fish oil-omega-3 fatty acids 1000 MG capsule Take 2 g by mouth daily.        . fluconazole (DIFLUCAN) 150 MG tablet TAKE AS DIRECTED  2 tablet  0  . furosemide (LASIX) 20 MG tablet  Take 1 tablet (20 mg total) by mouth daily as needed (for ankle swelling).  30 tablet  11  . hydrochlorothiazide 25 MG tablet Take 1 tablet (25 mg total) by mouth as needed.  30 tablet  6  . KLOR-CON 10 10 MEQ CR tablet TAKE 2 TABLETS (20 MEQ TOTAL) BY MOUTH DAILY.  180 tablet  3  . losartan (COZAAR) 100 MG tablet Take 1 tablet (100 mg total) by mouth daily.  90 tablet  3  . pantoprazole (PROTONIX) 20 MG tablet TAKE 1 TABLET BY MOUTH EVERY DAY  30 tablet  5  . phenazopyridine (AZO-TABS) 95 MG tablet Take 95 mg by mouth daily.        [name] SYNTHROID 137 MCG tablet TAKE 1 BY MOUTH ONCE DAILY  30 tablet  5  . zolpidem (AMBIEN) 10 MG tablet Take 1 tablet (10 mg total) by mouth at bedtime as needed.  30 tablet  6  . Cholecalciferol (VITAMIN D) 2000 UNITS CAPS Take 1 capsule by mouth daily.        . Fiber CAPS Take 2 capsules by mouth daily.        . Multiple Vitamin (MULTIVITAMIN) tablet Take 1 tablet by mouth daily.            Review of Systems  Constitutional: Positive for appetite change.  HENT: Positive for congestion  and sore throat.   Respiratory: Positive for cough and shortness of breath.   Cardiovascular: Positive for leg swelling.  Neurological: Positive for headaches.       Objective:   Physical Exam BP 140/80  Pulse 84  Temp(Src) 99.2 F (37.3 C) (Oral)  Ht 5\' 5"  (1.651 m)  Wt 280 lb 6.4 oz (127.189 kg)  BMI 46.66 kg/m2  SpO2 93%  General - obese HEENT - PERRLA, EOMI, no sinus tenderness, no oral exudate, MP 3, no LAN Cardiac - s1s2 regular, no murmur Chest - no wheeze/rales Abd - obese, soft, nontender Ext - 1+ edema Neuro - normal strength, ambulates with cane Psych - normal mood/behavior    Assessment & Plan:   OSA (obstructive sleep apnea) She has snoring, sleep disruption, and daytime sleepiness.  She has recent hospitalization and found to have oxygen desaturation while asleep.  She has a history of hypertension.  I am concerned that she may have sleep apnea.  I have explained how sleep apnea can affect the patient's health.  Driving precautions and importance of weight loss were discussed.  Treatment options for sleep apnea were reviewed.  To further assess will arrange for in-lab sleep study.  Will start study w/o supplemental oxygen, and then add as needed.    Updated Medication List Outpatient Encounter Prescriptions as of 01/17/2011  Medication Sig Dispense Refill  . aspirin 81 MG tablet Take 81 mg by mouth daily.        . fish oil-omega-3 fatty acids 1000 MG capsule Take 2 g by mouth daily.        . fluconazole (DIFLUCAN) 150 MG tablet TAKE AS DIRECTED  2 tablet  0  . furosemide (LASIX) 20 MG tablet Take 1 tablet (20 mg total) by mouth daily as needed (for ankle swelling).  30 tablet  11  . hydrochlorothiazide 25 MG tablet Take 1 tablet (25 mg total) by mouth as needed.  30 tablet  6  . KLOR-CON 10 10 MEQ CR tablet TAKE 2 TABLETS (20 MEQ TOTAL) BY MOUTH DAILY.  180 tablet  3  . losartan (COZAAR) 100  MG tablet Take 1 tablet (100 mg total) by mouth daily.  90 tablet   3  . pantoprazole (PROTONIX) 20 MG tablet TAKE 1 TABLET BY MOUTH EVERY DAY  30 tablet  5  . phenazopyridine (AZO-TABS) 95 MG tablet Take 95 mg by mouth daily.        [name] SYNTHROID 137 MCG tablet TAKE 1 BY MOUTH ONCE DAILY  30 tablet  5  . zolpidem (AMBIEN) 10 MG tablet Take 1 tablet (10 mg total) by mouth at bedtime as needed.  30 tablet  6  . DISCONTD: Cholecalciferol (VITAMIN D) 2000 UNITS CAPS Take 1 capsule by mouth daily.        [name] DISCONTD: Fiber CAPS Take 2 capsules by mouth daily.        [name] DISCONTD: Multiple Vitamin (MULTIVITAMIN) tablet Take 1 tablet by mouth daily.

## 2011-01-17 NOTE — Progress Notes (Deleted)
  Subjective:    Patient ID: Tamara Morales, female    DOB: 18-Jan-1931, 77 y.o.   MRN: [id number]  HPI 77 yo female for sleep evaluation.  She was recently hospitalized for chest pain.  She was noted to have trouble with he oxygen while asleep.  She does snore, and has trouble feeling sleepy during the day.  The patient denies sleep walking, sleep talking, bruxism, or nightmares.  There is no history of restless legs.  The patient denies sleep hallucinations, sleep paralysis, or cataplexy.    Review of Systems     Objective:   Physical Exam        Assessment & Plan:

## 2011-01-18 ENCOUNTER — Ambulatory Visit (INDEPENDENT_AMBULATORY_CARE_PROVIDER_SITE_OTHER): Payer: Medicare Other | Admitting: Licensed Clinical Social Worker

## 2011-01-18 DIAGNOSIS — F331 Major depressive disorder, recurrent, moderate: Secondary | ICD-10-CM

## 2011-01-22 ENCOUNTER — Ambulatory Visit (HOSPITAL_BASED_OUTPATIENT_CLINIC_OR_DEPARTMENT_OTHER): Payer: Medicare Other | Attending: Pulmonary Disease

## 2011-01-22 DIAGNOSIS — G4733 Obstructive sleep apnea (adult) (pediatric): Secondary | ICD-10-CM

## 2011-01-22 DIAGNOSIS — Z79899 Other long term (current) drug therapy: Secondary | ICD-10-CM | POA: Insufficient documentation

## 2011-01-31 ENCOUNTER — Ambulatory Visit (INDEPENDENT_AMBULATORY_CARE_PROVIDER_SITE_OTHER): Payer: Medicare Other | Admitting: Licensed Clinical Social Worker

## 2011-01-31 ENCOUNTER — Institutional Professional Consult (permissible substitution): Payer: Medicare Other | Admitting: Pulmonary Disease

## 2011-01-31 DIAGNOSIS — F331 Major depressive disorder, recurrent, moderate: Secondary | ICD-10-CM

## 2011-02-02 ENCOUNTER — Telehealth: Payer: Self-pay | Admitting: Pulmonary Disease

## 2011-02-02 DIAGNOSIS — G4733 Obstructive sleep apnea (adult) (pediatric): Secondary | ICD-10-CM

## 2011-02-02 NOTE — Telephone Encounter (Signed)
Pt aware we will check with dr Halford Chessman and give her a call back,Dr sood pls advise if results are ready

## 2011-02-06 ENCOUNTER — Institutional Professional Consult (permissible substitution): Payer: Medicare Other | Admitting: Pulmonary Disease

## 2011-02-07 ENCOUNTER — Encounter: Payer: Self-pay | Admitting: Pulmonary Disease

## 2011-02-07 DIAGNOSIS — G4733 Obstructive sleep apnea (adult) (pediatric): Secondary | ICD-10-CM

## 2011-02-07 NOTE — Procedures (Addendum)
NAME:  Tamara Morales, Tamara Morales NO.:  [id number]  MEDICAL RECORD NO.:  [id number]          PATIENT TYPE:  OUT  LOCATION:  SLEEP CENTER                 FACILITY:  [facility]  PHYSICIAN:  Chesley Mires, MD        DATE OF BIRTH:  [birth year]  DATE OF STUDY:  01/22/2011                           NOCTURNAL POLYSOMNOGRAM  REFERRING PHYSICIAN:  Chesley Mires, MD  INDICATION FOR STUDY:  [name] is a 77 year old female who has a history of hypertension.  She also has snoring, sleep disruption, daytime sleepiness.  She was also recently hospitalized and was noted to have oxygen desaturation while asleep.  She is therefore referred to Sleep Lab for evaluation of hypersomnia with obstructive sleep apnea.  Height is 5 feet and 5 inches, weight is 208 pounds.  BMI is 47.  Neck size 16 inches.  EPWORTH SLEEPINESS SCORE:  7.  MEDICATIONS:  Furosemide, losartan, hydrochlorothiazide, Klor-Con, zolpidem, Synthroid, pantoprazole, aspirin, and Diflucan.  The patient took an Ambien on the night of study.  SLEEP ARCHITECTURE:  Total recording time was 356 minutes.  Total sleep time was 249 minutes.  Sleep efficiency was 70%.  Sleep latency was 47 minutes.  REM latency was 88 minutes.  The study was notable for the lack of stage III sleep.  She slept in both the supine and supine position.  RESPIRATORY DATA:  The average respiratory rate was 20.  Loud snoring was noted by the technician.  The overall apnea/hypopnea index was 74.3. The events were exclusively obstructive in nature.  The patient did not have sufficient sleep time during the initial part of study to qualify for a split night protocol.  OXYGEN DATA:  The baseline oxygenation was 94%.  The oxygen saturation nadir was 76%.  The study was conducted without the use of supplemental oxygen.  CARDIAC DATA:  The average heart rate was 83 and the rhythm strip showed sinus rhythm with occasional PVCs.  MOVEMENT-PARASOMNIA:  The patient  had 1 restroom trip and the periodic limb movement index was 0.  IMPRESSIONS-RECOMMENDATIONS:  This study shows evidence for severe obstructive sleep apnea with an apnea/hypopnea index of 74.3 and oxygen saturation nadir of 76%.  In addition to diet, excise, and weight reduction, I would recommend that the patient return to Sleep Lab for a CPAP titration study.     Chesley Mires, MD Diplomat, American Board of Sleep Medicine Electronically Signed    VS/MEDQ  D:  02/06/2011 19:30:53  T:  02/07/2011 02:50:23  Job:  [id number]

## 2011-02-07 NOTE — Telephone Encounter (Signed)
PSG 01/22/11>>AHI 74.3, SpO2 low 76%.  Results d/w pt over the phone.  I have placed order for auto CPAP and ONO with auto CPAP and room air.  Will have my nurse schedule ROV in 2 months.

## 2011-02-14 NOTE — Telephone Encounter (Signed)
Pt states she has not even been set up. Called SMS to see what the hold up is and the office is closed. WCB in the AM

## 2011-02-15 ENCOUNTER — Ambulatory Visit (INDEPENDENT_AMBULATORY_CARE_PROVIDER_SITE_OTHER): Payer: Medicare Other | Admitting: Licensed Clinical Social Worker

## 2011-02-15 DIAGNOSIS — F331 Major depressive disorder, recurrent, moderate: Secondary | ICD-10-CM

## 2011-02-21 NOTE — Telephone Encounter (Signed)
Pt states tSMS is coming to set up her up with an apt this afternoon. Nothing further was needed

## 2011-03-01 ENCOUNTER — Ambulatory Visit (INDEPENDENT_AMBULATORY_CARE_PROVIDER_SITE_OTHER): Payer: Medicare Other | Admitting: Internal Medicine

## 2011-03-01 ENCOUNTER — Encounter: Payer: Self-pay | Admitting: Internal Medicine

## 2011-03-01 VITALS — BP 110/64 | HR 97 | Temp 97.8°F | Ht 65.0 in | Wt 272.8 lb

## 2011-03-01 DIAGNOSIS — E039 Hypothyroidism, unspecified: Secondary | ICD-10-CM

## 2011-03-01 DIAGNOSIS — E785 Hyperlipidemia, unspecified: Secondary | ICD-10-CM

## 2011-03-01 DIAGNOSIS — I10 Essential (primary) hypertension: Secondary | ICD-10-CM

## 2011-03-01 NOTE — Patient Instructions (Signed)
It was good to see you today. Continue 12.5 hctz with losatan for blood pressure as ongoing We have reviewed your prior records including labs and tests today Please schedule followup in 4 months for blood pressure and weight check, call sooner if problems.  Wt Readings from Last 3 Encounters:  03/01/11 272 lb 12.8 oz (123.741 kg)  01/17/11 280 lb 6.4 oz (127.189 kg)  01/04/11 276 lb (125.193 kg)

## 2011-03-01 NOTE — Assessment & Plan Note (Signed)
Lab Results  Component Value Date   TSH 1.277 01/02/2011   The current medical regimen is effective;  continue present plan and medications.

## 2011-03-01 NOTE — Assessment & Plan Note (Signed)
Home bp log reviewed - variable BP Prior poor tolerance of diovan but tolerating losartan without adv SE- Titrate to BP goal at this time, continue12.5 hctz sched and prn BP Readings from Last 3 Encounters:  03/01/11 110/64  01/17/11 140/80  01/04/11 130/84

## 2011-03-01 NOTE — Progress Notes (Signed)
Subjective:    Patient ID: Tamara Morales, female    DOB: Jul 07, 1930, 77 y.o.   MRN: [id number]  HPI  here for follow up - reviewed chronic medical issues:  hypothyroid - related to surg resection for goiter when hyperparathyroid glands (3 of 4) removed - no hx thyroid cancer - feels fatigued and complains of brittle nails when level is off - chronic weight gain - no bowel changes  hypertension - diovan rx'd 04/2010 but caused nausea and weak feeling - changed to losartan 08/2010 with hctz as needed, titrated to max but still high in AM - reports compliance with ongoing medical treatment and denies adverse side effects related to current therapy: no CP, edema or headache; no hx CVA or CAD (neg cath 03/2010 hosp for CP) -   dyslipidemia - previous on lipitor but stopped 07/2009 (pt unclear as to why) - takes fish oil - denies muscle weakenss or GI upset -  GERD with reflux - reports compliance with ongoing medical treatment and no recent change in medication (on pantopr.otazole). denies adverse side effects related to current therapy.    OA, B knees - no other joints affected - s/p TKR for each - keeps active with walking - no chronic pain meds needed  depression - working with counselor susan bond due to life stressors - feels improved - wishes to avoid meds if poss -   obesity - fustrated with weight gain since spring 2011 - cutting calories but would like more diet help  Past Medical History  Diagnosis Date  . Morbid obesity   . MIGRAINE HEADACHE   . ARTHRITIS, KNEES, BILATERAL   . DEPRESSION   . GERD   . HYPERLIPIDEMIA   . HYPERTENSION   . Breast ca 2000    s/p R lumpectomy and XRT  . DJD (degenerative joint disease) of knee   . Urge incontinence   . HYPOTHYROIDISM     postsurgical  . OSA (obstructive sleep apnea) 01/17/2011    Review of Systems  Eyes: Negative for visual disturbance.  Respiratory: Negative for cough and shortness of breath.   Cardiovascular: Negative  for chest pain and leg swelling.  Neurological: Negative for weakness and headaches.      Objective:   Physical Exam  BP 110/64  Pulse 97  Temp(Src) 97.8 F (36.6 C) (Oral)  Ht 5\' 5"  (1.651 m)  Wt 272 lb 12.8 oz (123.741 kg)  BMI 45.40 kg/m2  SpO2 95% Wt Readings from Last 3 Encounters:  03/01/11 272 lb 12.8 oz (123.741 kg)  01/17/11 280 lb 6.4 oz (127.189 kg)  01/04/11 276 lb (125.193 kg)   Constitutional: She is obese; appears well-developed and well-nourished. No distress. Neck: Normal range of motion. Neck supple. No JVD present. No thyromegaly present.  Cardiovascular: Normal rate, regular rhythm and normal heart sounds.  No murmur heard. trace edema BLE. Pulmonary/Chest: Effort normal and breath sounds normal. No respiratory distress. She has no wheezes.  Neurological: She is alert and oriented to person, place, and time. No cranial nerve deficit. Coordination normal.  Psychiatric: She has a normal mood and affect. Her behavior is normal. Judgment and thought content normal.   Lab Results  Component Value Date   WBC 7.0 01/15/2011   HGB 13.6 01/15/2011   HCT 40.0 01/15/2011   PLT 170 01/15/2011   CHOL 189 08/23/2010   TRIG 167.0* 08/23/2010   HDL 43.20 08/23/2010   ALT 10 01/02/2011   AST 13 01/02/2011   NA  141 01/15/2011   K 3.5 01/15/2011   CL 103 01/15/2011   CREATININE 0.80 01/15/2011   BUN 12 01/15/2011   CO2 27 01/02/2011   TSH 1.277 01/02/2011   INR 1.02 03/20/2010   HGBA1C 5.9* 01/02/2011       Assessment & Plan:  See problem list. Medications and labs reviewed today.

## 2011-03-01 NOTE — Assessment & Plan Note (Signed)
Prior PCP stopped Lipitor 07/2009 - reminded to work on diet/exercise and weight control to manage same Lab Results  Component Value Date   LDLCALC 112* 08/23/2010

## 2011-03-01 NOTE — Assessment & Plan Note (Signed)
Wt Readings from Last 3 Encounters:  03/01/11 272 lb 12.8 oz (123.741 kg)  01/17/11 280 lb 6.4 oz (127.189 kg)  01/04/11 276 lb (125.193 kg)   Encouragement and education provided today on diet and exercise needs for optimization of weight and health

## 2011-03-07 ENCOUNTER — Ambulatory Visit (INDEPENDENT_AMBULATORY_CARE_PROVIDER_SITE_OTHER): Payer: Medicare Other | Admitting: Licensed Clinical Social Worker

## 2011-03-07 DIAGNOSIS — F331 Major depressive disorder, recurrent, moderate: Secondary | ICD-10-CM

## 2011-03-27 ENCOUNTER — Telehealth: Payer: Self-pay

## 2011-03-27 NOTE — Telephone Encounter (Signed)
noted 

## 2011-03-27 NOTE — Telephone Encounter (Signed)
[facility] Triage Call Report Triage Record Num: [id number] Operator: Buford Dresser Patient Name: Tamara Morales Call Date & Time: 03/26/2011 4:21:26PM Patient Phone: [phone] PCP: Gwendolyn Grant Patient Gender: Female PCP Fax : [phone] Patient DOB: 07-Feb-1931 Practice Name: [facility] Reason for Call: Calling about having abd painand then diarrhea x 1 with strong odor, sli dizzy.Onset 03/26/11 @ 1400.Afebrile.Husband is also had diarrhea at same time today.Husband has had hx of diarrhea for months, possible C diff--was on Flagyl--finished about 2 weeks ago.No abd pain or diarrhea now.All emergent sxs of Diarrhea r/o.Home care advice given.Advised to call office in am if sxs continue. Protocol(s) Used: Diarrhea or Other Change in Bowel Habits Recommended Outcome per Protocol: Provide Home/Self Care Reason for Outcome: All other situations Care Advice: ~ Call provider if diarrhea does not improve after 2 days of home care. ~ SYMPTOM / CONDITION MANAGEMENT ~ CAUTIONS Diarrheal Care: - Drink 2-3 quarts (2-3 liters) per day of low sugar content fluids, including over the counter oral hydration solution, unless directed otherwise by provider. - If accompanied by vomiting, take the fluids in frequent small sips or suck on ice chips. - Eat easily digested foods (such as bananas, rice, applesauce, toast, cooked cereals, soup, crackers, baked or boiled potato, or baked chicken or [place] without skin). - Do not eat high fiber, high fat, high sugar content foods, or highly seasoned foods. - Do not drink caffeinated or alcoholic beverages. - Avoid milk and milk products while having symptoms. As symptoms improve, gradually add back to diet. - Application of A&D ointment or witch hazel medicated pads may help anal irritation. - Antidiarrheal medications are usually unnecessary. If symptoms are severe, consider nonprescription antidiarrheal and anti-motility drugs as directed by  label or a provider. Do not take if have high fever or bloody diarrhea. If pregnant, do not take any medications not approved by your provider. - Consult your provider for advice regarding continuing prescription medication. ~ 03/26/2011 4:39:07PM Page 1 of 1 [email or web address]

## 2011-04-06 ENCOUNTER — Other Ambulatory Visit: Payer: Self-pay | Admitting: Internal Medicine

## 2011-04-12 ENCOUNTER — Encounter: Payer: Self-pay | Admitting: Pulmonary Disease

## 2011-04-17 ENCOUNTER — Ambulatory Visit (HOSPITAL_BASED_OUTPATIENT_CLINIC_OR_DEPARTMENT_OTHER): Payer: Medicare Other | Attending: Pulmonary Disease

## 2011-04-17 ENCOUNTER — Encounter: Payer: Self-pay | Admitting: Pulmonary Disease

## 2011-04-17 ENCOUNTER — Ambulatory Visit (INDEPENDENT_AMBULATORY_CARE_PROVIDER_SITE_OTHER): Payer: Medicare Other | Admitting: Pulmonary Disease

## 2011-04-17 VITALS — BP 124/78 | HR 80 | Temp 97.8°F | Ht 65.0 in | Wt 280.8 lb

## 2011-04-17 DIAGNOSIS — G4733 Obstructive sleep apnea (adult) (pediatric): Secondary | ICD-10-CM

## 2011-04-17 NOTE — Assessment & Plan Note (Signed)
She reports compliance and benefit from therapy.  I have not received her CPAP download or ONO results yet.  Will contact her DME to arrange for these.  Will refer her to the sleep lab to have her mask fit assessed.  She may need to switch her DME company if she continues to have difficulty with them.  Will call her with results of download and ONO, and then decide if her home oxygen set up can be discontinued.

## 2011-04-17 NOTE — Patient Instructions (Signed)
Will arrange for mask fit at the sleep lab Will get report from CPAP machine and call with results Will arrange for overnight oxygen test and call with results Follow up in 4 months

## 2011-04-17 NOTE — Progress Notes (Signed)
Chief Complaint  Patient presents with  . Follow-up    Pt states she wears her cpap everynight x5 hrs a night. Pt states the mask may be to small for her face.[name]    History of Present Illness: 77 yo female with severe sleep apnea.  She had her PSG on 01/22/11>>AHI 74.3, SpO2 low 76%.  She has since been started on auto-CPAP.  She has been using her machine for about 5 hours per night.  She has not been using oxygen with her machine.  I have not received her CPAP donwload or ONO from her DME yet.  She has a full face mask.  She thinks her mask is too small.  She has tried contacting her DME, but they have not been very responsive.  She feels she is sleeping better, and feeling better during the day.  She tends to go to bed late, and wake up late.  This does not interfere with her daily activities.  She no longer wakes up with feelings of dread since starting CPAP.  She continues to use [place] before going to sleep.  Past Medical History  Diagnosis Date  . Morbid obesity   . MIGRAINE HEADACHE   . ARTHRITIS, KNEES, BILATERAL   . DEPRESSION   . GERD   . HYPERLIPIDEMIA   . HYPERTENSION   . Breast ca 2000    s/p R lumpectomy and XRT  . DJD (degenerative joint disease) of knee   . Urge incontinence   . HYPOTHYROIDISM     postsurgical  . OSA (obstructive sleep apnea) 01/17/2011    Past Surgical History  Procedure Date  . Cholecystectomy 1995  . Breast lumpectomy 2000  . Thyroidectomy   . Breast biopsy 2000  . Replacement total knee bilateral 1999, 2005  . Tubal ligation   . Parathyroidectomy     2-3 removed  . Tonsillectomy   . Adenoidectomy   . Right leg femur repaired     Current Outpatient Prescriptions on File Prior to Visit  Medication Sig Dispense Refill  . aspirin 81 MG tablet Take 81 mg by mouth daily.        . fluconazole (DIFLUCAN) 150 MG tablet TAKE AS DIRECTED  2 tablet  0  . furosemide (LASIX) 20 MG tablet Take 1 tablet (20 mg total) by mouth daily as needed  (for ankle swelling).  30 tablet  11  . hydrochlorothiazide 25 MG tablet Take 0.5 tablets (12.5 mg total) by mouth daily.  30 tablet  6  . KLOR-CON 10 10 MEQ CR tablet TAKE 2 TABLETS (20 MEQ TOTAL) BY MOUTH DAILY.  180 tablet  3  . pantoprazole (PROTONIX) 20 MG tablet TAKE 1 TABLET BY MOUTH EVERY DAY  30 tablet  5  . phenazopyridine (AZO-TABS) 95 MG tablet Take 95 mg by mouth daily.        [name] SYNTHROID 137 MCG tablet TAKE 1 BY MOUTH ONCE DAILY  30 tablet  5  . zolpidem (AMBIEN) 10 MG tablet Take 1 tablet (10 mg total) by mouth at bedtime as needed.  30 tablet  6    Allergies  Allergen Reactions  . Beta Adrenergic Blockers   . Ciprofloxacin     REACTION: edgy  . Codeine     REACTION: Nausea$RemoveBeforeDE' \\T'FqJEgEQLNabiTPX$ \ vomitting  . Epinephrine     REACTION: rapid pulse, sweats  . Propoxyphene Hcl     REACTION: Nausea$RemoveBeforeDE' \\T'vYyCFRxHPBywpRz$ \ vomitting    Physical Exam:  Blood pressure 124/78, pulse 80, temperature  97.8 F (36.6 C), temperature source Oral, height $RemoveBefo'5\' 5"'udbcgoIyNwN$  (1.651 m), weight 280 lb 12.8 oz (127.37 kg), SpO2 96.00%.  Wt Readings from Last 3 Encounters:  03/01/11 272 lb 12.8 oz (123.741 kg)  01/17/11 280 lb 6.4 oz (127.189 kg)   General - obese  HEENT - PERRLA, EOMI, no sinus tenderness, no oral exudate, MP 3, no LAN  Cardiac - s1s2 regular, no murmur  Chest - no wheeze/rales  Abd - obese, soft, nontender  Ext - 1+ edema  Neuro - normal strength, ambulates with cane  Psych - normal mood/behavior  Assessment/Plan:  OSA (obstructive sleep apnea) She reports compliance and benefit from therapy.  I have not received her CPAP download or ONO results yet.  Will contact her DME to arrange for these.  Will refer her to the sleep lab to have her mask fit assessed.  She may need to switch her DME company if she continues to have difficulty with them.  Will call her with results of download and ONO, and then decide if her home oxygen set up can be discontinued.     Outpatient Encounter Prescriptions as of  04/17/2011  Medication Sig Dispense Refill  . aspirin 81 MG tablet Take 81 mg by mouth daily.        . fluconazole (DIFLUCAN) 150 MG tablet TAKE AS DIRECTED  2 tablet  0  . furosemide (LASIX) 20 MG tablet Take 1 tablet (20 mg total) by mouth daily as needed (for ankle swelling).  30 tablet  11  . hydrochlorothiazide 25 MG tablet Take 0.5 tablets (12.5 mg total) by mouth daily.  30 tablet  6  . KLOR-CON 10 10 MEQ CR tablet TAKE 2 TABLETS (20 MEQ TOTAL) BY MOUTH DAILY.  180 tablet  3  . pantoprazole (PROTONIX) 20 MG tablet TAKE 1 TABLET BY MOUTH EVERY DAY  30 tablet  5  . phenazopyridine (AZO-TABS) 95 MG tablet Take 95 mg by mouth daily.        [name] SYNTHROID 137 MCG tablet TAKE 1 BY MOUTH ONCE DAILY  30 tablet  5  . zolpidem (AMBIEN) 10 MG tablet Take 1 tablet (10 mg total) by mouth at bedtime as needed.  30 tablet  6  . DISCONTD: fish oil-omega-3 fatty acids 1000 MG capsule Take 2 g by mouth daily.        [name] DISCONTD: losartan (COZAAR) 100 MG tablet Take 1 tablet (100 mg total) by mouth daily.  90 tablet  3    Jazminn Pomales 04/17/2011, 11:48 AM

## 2011-04-19 ENCOUNTER — Telehealth: Payer: Self-pay | Admitting: Pulmonary Disease

## 2011-04-19 ENCOUNTER — Encounter: Payer: Self-pay | Admitting: Pulmonary Disease

## 2011-04-19 DIAGNOSIS — G4733 Obstructive sleep apnea (adult) (pediatric): Secondary | ICD-10-CM

## 2011-04-19 NOTE — Telephone Encounter (Signed)
Auto CPAP 02/21/11 to 03/13/11>>Used on 20 of 21 nights with average 3 hrs 39 min.  Average AHI 6.1 with median CPAP 9.5 cm H2O and 95th percentile CPAP 14 cm H2O.  Will have my nurse inform pt that CPAP report shows good control of sleep apnea and will set pressure at 10 cm H2O, but she needs to use machine more to get most benefit.  Will call her once her ONO is available.

## 2011-04-20 NOTE — Telephone Encounter (Signed)
ATC NA unable to leave VM WCB 

## 2011-04-21 NOTE — Telephone Encounter (Signed)
I spoke with patient about results and he verbalized understanding and had no questions. Pt aware will call once ONO results are available

## 2011-05-01 ENCOUNTER — Telehealth: Payer: Self-pay | Admitting: Pulmonary Disease

## 2011-05-01 DIAGNOSIS — G4733 Obstructive sleep apnea (adult) (pediatric): Secondary | ICD-10-CM

## 2011-05-01 NOTE — Telephone Encounter (Signed)
Spoke with pt. She wants to know if [facility] has reviewed her ONO yet so she can get rid of her o2. She states that she has not used o2 for the past several months and wants to have [place] come and pick up. Please advise, thanks!

## 2011-05-02 ENCOUNTER — Telehealth: Payer: Self-pay

## 2011-05-02 MED ORDER — ISOMETHEPTENE-APAP-DICHLORAL 65-325-100 MG PO CAPS: 1.0000 | ORAL_CAPSULE | ORAL | Status: DC | PRN

## 2011-05-02 NOTE — Telephone Encounter (Signed)
Pt called requesting Rx of Midrin that she uses for the occasional migraine. Pt does not have this medication on med list because the first one she received form previous MD has lasted for 1-2 years.

## 2011-05-02 NOTE — Telephone Encounter (Signed)
ok 

## 2011-05-03 NOTE — Telephone Encounter (Signed)
I have not received her ONO yet.  Can you contact her DME to get report faxed over.

## 2011-05-03 NOTE — Telephone Encounter (Signed)
Received ONO results and gave to VS to review.

## 2011-05-03 NOTE — Telephone Encounter (Signed)
ONO with RA and CPAP 04/21/11>>Test time 5 hr 12 min.  Basal SpO2 87%, low SpO2 73%.  Spent 161 min with SpO2 < 88%.  Results d/w pt.  Advised that she will need to use oxygen at night with CPAP.  Will repeat ONO with CPAP and 2 liters oxygen and call her with results.  If she still has trouble, will then need to consider in lab titration.

## 2011-05-03 NOTE — Telephone Encounter (Signed)
Called and spoke with Pernice from [facility] and requested ONO results.  Awaiting results to the triage fax #.

## 2011-05-16 ENCOUNTER — Other Ambulatory Visit: Payer: Self-pay | Admitting: Internal Medicine

## 2011-05-18 ENCOUNTER — Encounter: Payer: Self-pay | Admitting: Pulmonary Disease

## 2011-05-23 ENCOUNTER — Telehealth: Payer: Self-pay

## 2011-05-23 NOTE — Telephone Encounter (Signed)
Pt called requesting Rx for Celexa 25 mg. Pt says this medication was Rx'd by her previous PCP Dr Harrison Mons. Pt uses this medication to treat minor depression, okay to update and refill?

## 2011-05-23 NOTE — Telephone Encounter (Addendum)
Celexa does not make 25 mg tablet -Celexa either comes in 10, 20 or 40. Possible 25 mg medications include Paxil CR or sertraline. Please try to clarify with patient and we can then send refill. Thanks

## 2011-05-25 MED ORDER — CITALOPRAM HYDROBROMIDE 10 MG PO TABS: 10.0000 mg | ORAL_TABLET | Freq: Every day | ORAL | Status: DC

## 2011-05-25 NOTE — Telephone Encounter (Signed)
Called pt to verify dosage. Pt states it was celexa 10 mg. Inform pt will send to pharmacy...05/25/11@1 :58pm/LMB

## 2011-06-12 ENCOUNTER — Telehealth: Payer: Self-pay

## 2011-06-12 MED ORDER — OSELTAMIVIR PHOSPHATE 75 MG PO CAPS: 75.0000 mg | ORAL_CAPSULE | Freq: Two times a day (BID) | ORAL | Status: DC

## 2011-06-12 NOTE — Telephone Encounter (Signed)
Pt called stating spouse was Dx with Flu type A. Pt is requesting Rx for tamiflu. [facility] [phone] telephone number

## 2011-06-12 NOTE — Telephone Encounter (Signed)
Pt advised via VM 

## 2011-06-12 NOTE — Telephone Encounter (Signed)
ok 

## 2011-06-19 ENCOUNTER — Other Ambulatory Visit: Payer: Self-pay | Admitting: Internal Medicine

## 2011-06-29 ENCOUNTER — Other Ambulatory Visit (INDEPENDENT_AMBULATORY_CARE_PROVIDER_SITE_OTHER): Payer: Medicare Other

## 2011-06-29 ENCOUNTER — Ambulatory Visit (INDEPENDENT_AMBULATORY_CARE_PROVIDER_SITE_OTHER): Payer: Medicare Other | Admitting: Internal Medicine

## 2011-06-29 ENCOUNTER — Encounter: Payer: Self-pay | Admitting: Internal Medicine

## 2011-06-29 DIAGNOSIS — R7309 Other abnormal glucose: Secondary | ICD-10-CM

## 2011-06-29 DIAGNOSIS — M129 Arthropathy, unspecified: Secondary | ICD-10-CM | POA: Diagnosis not present

## 2011-06-29 DIAGNOSIS — R739 Hyperglycemia, unspecified: Secondary | ICD-10-CM

## 2011-06-29 DIAGNOSIS — I10 Essential (primary) hypertension: Secondary | ICD-10-CM

## 2011-06-29 DIAGNOSIS — E039 Hypothyroidism, unspecified: Secondary | ICD-10-CM | POA: Diagnosis not present

## 2011-06-29 MED ORDER — ZOLPIDEM TARTRATE 10 MG PO TABS: 10.0000 mg | ORAL_TABLET | Freq: Every evening | ORAL | Status: DC | PRN

## 2011-06-29 NOTE — Assessment & Plan Note (Signed)
Wt Readings from Last 3 Encounters:  06/29/11 280 lb 6.4 oz (127.189 kg)  04/17/11 280 lb 12.8 oz (127.37 kg)  03/01/11 272 lb 12.8 oz (123.741 kg)   Encouragement and education provided today on diet and exercise needs for optimization of weight and health

## 2011-06-29 NOTE — Assessment & Plan Note (Signed)
TKR B 2000 - Increasing R knee pain - refer to PT adrian as requested and pt to call if worse

## 2011-06-29 NOTE — Assessment & Plan Note (Signed)
Lab Results  Component Value Date   TSH 1.277 01/02/2011   The current medical regimen is effective;  continue present plan and medications.

## 2011-06-29 NOTE — Progress Notes (Signed)
Subjective:    Patient ID: Tamara Morales, female    DOB: 1930/09/24, 78 y.o.   MRN: [id number]  HPI  here for follow up - reviewed chronic medical issues:  hypothyroid - related to surg resection for goiter when hyperparathyroid glands (3 of 4) removed - no hx thyroid cancer - feels fatigued and complains of brittle nails when level is off - chronic weight gain - no bowel changes  hypertension - diovan rx'd 04/2010 but caused nausea and weak feeling - changed to losartan 08/2010 with hctz as needed, titrated to max but still high in AM - reports compliance with ongoing medical treatment and denies adverse side effects related to current therapy: no CP, edema or headache; no hx CVA or CAD (neg cath 03/2010 hosp for CP) -   dyslipidemia - previous on lipitor but stopped 07/2009 (pt unclear as to why) - takes fish oil - denies muscle weakenss or GI upset -  GERD with reflux - reports compliance with ongoing medical treatment and no recent change in medication (on pantopr.otazole). denies adverse side effects related to current therapy.    OA, B knees - no other joints affected - s/p TKR for each - keeps active with walking but increasing pain in RLE - requests PT tx at [facility] facility - - no chronic pain meds needed  depression - working with counselor susan bond due to life stressors - feels improved - wishes to avoid meds if poss -   obesity - fustrated with weight gain since spring 2011 - cutting calories but would like more diet help  Past Medical History  Diagnosis Date  . Morbid obesity   . MIGRAINE HEADACHE   . ARTHRITIS, KNEES, BILATERAL   . DEPRESSION   . GERD   . HYPERLIPIDEMIA   . HYPERTENSION   . Breast ca 2000    s/p R lumpectomy and XRT  . DJD (degenerative joint disease) of knee   . Urge incontinence   . HYPOTHYROIDISM     postsurgical  . OSA (obstructive sleep apnea) 01/17/2011 dx    Review of Systems  Eyes: Negative for visual disturbance.  Respiratory: Negative  for cough and shortness of breath.   Cardiovascular: Negative for chest pain and leg swelling.  Neurological: Negative for weakness and headaches.      Objective:   Physical Exam  BP 118/72  Pulse 84  Temp(Src) 97 F (36.1 C) (Oral)  Wt 280 lb 6.4 oz (127.189 kg)  SpO2 95% Wt Readings from Last 3 Encounters:  06/29/11 280 lb 6.4 oz (127.189 kg)  04/17/11 280 lb 12.8 oz (127.37 kg)  03/01/11 272 lb 12.8 oz (123.741 kg)   Constitutional: She is obese; appears well-developed and well-nourished. No distress. Neck: Normal range of motion. Neck supple. No JVD present. No thyromegaly present.  Cardiovascular: Normal rate, regular rhythm and normal heart sounds.  No murmur heard. trace edema BLE. Pulmonary/Chest: Effort normal and breath sounds normal. No respiratory distress. She has no wheezes.  Neurological: She is alert and oriented to person, place, and time. No cranial nerve deficit. Coordination normal.  Psychiatric: She has a normal mood and affect. Her behavior is normal. Judgment and thought content normal.   Lab Results  Component Value Date   WBC 7.0 01/15/2011   HGB 13.6 01/15/2011   HCT 40.0 01/15/2011   PLT 170 01/15/2011   CHOL 189 08/23/2010   TRIG 167.0* 08/23/2010   HDL 43.20 08/23/2010   ALT 10 01/02/2011  AST 13 01/02/2011   NA 141 01/15/2011   K 3.5 01/15/2011   CL 103 01/15/2011   CREATININE 0.80 01/15/2011   BUN 12 01/15/2011   CO2 27 01/02/2011   TSH 1.277 01/02/2011   INR 1.02 03/20/2010   HGBA1C 5.9* 01/02/2011       Assessment & Plan:  See problem list. Medications and labs reviewed today.

## 2011-06-29 NOTE — Patient Instructions (Addendum)
It was good to see you today. We have reviewed your prior records including labs and tests today Medications reviewed, no changes at this time. Test(s) ordered today. Your results will be called to you after review (48-72hours after test completion). If any changes need to be made, you will be notified at that time. we'll make referral to physical therapy . Our office will contact you regarding appointment(s) once made. Please schedule followup in 4 months for blood pressure and weight check, call sooner if problems.  Wt Readings from Last 3 Encounters:  06/29/11 280 lb 6.4 oz (127.189 kg)  04/17/11 280 lb 12.8 oz (127.37 kg)  03/01/11 272 lb 12.8 oz (123.741 kg)

## 2011-06-29 NOTE — Assessment & Plan Note (Signed)
Home bp log reviewed - variable BP Prior poor tolerance of diovan but tolerating losartan without adv SE- Titrate to BP goal at this time, continue12.5 hctz sched and prn-  BP Readings from Last 3 Encounters:  06/29/11 118/72  04/17/11 124/78  03/01/11 110/64

## 2011-06-30 ENCOUNTER — Telehealth: Payer: Self-pay | Admitting: *Deleted

## 2011-06-30 DIAGNOSIS — M79673 Pain in unspecified foot: Secondary | ICD-10-CM

## 2011-06-30 NOTE — Telephone Encounter (Signed)
Pt notified.Marland KitchenMarland Kitchen1/11/13@4 :11pm/LMB

## 2011-06-30 NOTE — Telephone Encounter (Signed)
Referral to orthopedic foot specialist

## 2011-06-30 NOTE — Telephone Encounter (Signed)
Pt states she fail to mention yesterday big toe on (L) foot having a lot of pain, and hard to get around at times. Has seen a podiatrist was told she could do out patient surgery , but is wanting to see orthopedic md that deal with feet to get a second opinion.Marland KitchenMarland Kitchen1/11/13@2 :26pm/LMB

## 2011-07-17 DIAGNOSIS — M2040 Other hammer toe(s) (acquired), unspecified foot: Secondary | ICD-10-CM | POA: Diagnosis not present

## 2011-07-17 DIAGNOSIS — L84 Corns and callosities: Secondary | ICD-10-CM | POA: Diagnosis not present

## 2011-07-17 DIAGNOSIS — M2010 Hallux valgus (acquired), unspecified foot: Secondary | ICD-10-CM | POA: Diagnosis not present

## 2011-07-21 DIAGNOSIS — M25559 Pain in unspecified hip: Secondary | ICD-10-CM | POA: Diagnosis not present

## 2011-07-21 DIAGNOSIS — M129 Arthropathy, unspecified: Secondary | ICD-10-CM | POA: Diagnosis not present

## 2011-07-21 DIAGNOSIS — M6281 Muscle weakness (generalized): Secondary | ICD-10-CM | POA: Diagnosis not present

## 2011-07-24 DIAGNOSIS — M6281 Muscle weakness (generalized): Secondary | ICD-10-CM | POA: Diagnosis not present

## 2011-07-24 DIAGNOSIS — M129 Arthropathy, unspecified: Secondary | ICD-10-CM | POA: Diagnosis not present

## 2011-07-24 DIAGNOSIS — M25559 Pain in unspecified hip: Secondary | ICD-10-CM | POA: Diagnosis not present

## 2011-07-26 ENCOUNTER — Other Ambulatory Visit: Payer: Self-pay

## 2011-07-26 MED ORDER — DOXYCYCLINE HYCLATE 100 MG PO TABS: 100.0000 mg | ORAL_TABLET | Freq: Two times a day (BID) | ORAL | Status: AC

## 2011-07-26 NOTE — Telephone Encounter (Signed)
Doxy bid x 7d - erx done - OTC mucinex or Robitussin and tylenol/hydration - OV if worse- thanks

## 2011-07-26 NOTE — Telephone Encounter (Signed)
Pt called c/o productive cough, fever, ST and body pain. Pt is requesting Rx/advisement from MD.

## 2011-07-28 ENCOUNTER — Other Ambulatory Visit: Payer: Self-pay

## 2011-07-28 MED ORDER — HYDROCODONE-HOMATROPINE 5-1.5 MG/5ML PO SYRP: 5.0000 mL | ORAL_SOLUTION | Freq: Four times a day (QID) | ORAL | Status: AC | PRN

## 2011-07-28 NOTE — Telephone Encounter (Signed)
hydromet  

## 2011-07-28 NOTE — Telephone Encounter (Signed)
Pt called requesting MD advisement on cough suppressant. She says she has tried Delsym with mild relief, please advise.

## 2011-07-28 NOTE — Telephone Encounter (Signed)
Pt advised of Rx, medication faxed to pharmacy

## 2011-07-29 ENCOUNTER — Emergency Department (HOSPITAL_COMMUNITY)
Admission: EM | Admit: 2011-07-29 | Discharge: 2011-07-30 | Disposition: A | Discharge status: Home / Self Care | Payer: Medicare Other | Attending: Emergency Medicine | Admitting: Emergency Medicine

## 2011-07-29 DIAGNOSIS — R062 Wheezing: Secondary | ICD-10-CM | POA: Diagnosis not present

## 2011-07-29 DIAGNOSIS — R05 Cough: Secondary | ICD-10-CM | POA: Diagnosis not present

## 2011-07-29 DIAGNOSIS — Z7982 Long term (current) use of aspirin: Secondary | ICD-10-CM | POA: Insufficient documentation

## 2011-07-29 DIAGNOSIS — Z853 Personal history of malignant neoplasm of breast: Secondary | ICD-10-CM | POA: Insufficient documentation

## 2011-07-29 DIAGNOSIS — R0989 Other specified symptoms and signs involving the circulatory and respiratory systems: Secondary | ICD-10-CM | POA: Diagnosis not present

## 2011-07-29 DIAGNOSIS — R059 Cough, unspecified: Secondary | ICD-10-CM | POA: Insufficient documentation

## 2011-07-29 DIAGNOSIS — F3289 Other specified depressive episodes: Secondary | ICD-10-CM | POA: Insufficient documentation

## 2011-07-29 DIAGNOSIS — J209 Acute bronchitis, unspecified: Secondary | ICD-10-CM | POA: Diagnosis not present

## 2011-07-29 DIAGNOSIS — R9431 Abnormal electrocardiogram [ECG] [EKG]: Secondary | ICD-10-CM | POA: Diagnosis not present

## 2011-07-29 DIAGNOSIS — I517 Cardiomegaly: Secondary | ICD-10-CM | POA: Diagnosis not present

## 2011-07-29 DIAGNOSIS — E785 Hyperlipidemia, unspecified: Secondary | ICD-10-CM | POA: Insufficient documentation

## 2011-07-29 DIAGNOSIS — Z79899 Other long term (current) drug therapy: Secondary | ICD-10-CM | POA: Insufficient documentation

## 2011-07-29 DIAGNOSIS — I10 Essential (primary) hypertension: Secondary | ICD-10-CM | POA: Insufficient documentation

## 2011-07-29 DIAGNOSIS — F329 Major depressive disorder, single episode, unspecified: Secondary | ICD-10-CM | POA: Insufficient documentation

## 2011-07-29 DIAGNOSIS — E039 Hypothyroidism, unspecified: Secondary | ICD-10-CM | POA: Insufficient documentation

## 2011-07-29 DIAGNOSIS — J40 Bronchitis, not specified as acute or chronic: Secondary | ICD-10-CM | POA: Insufficient documentation

## 2011-07-29 DIAGNOSIS — K219 Gastro-esophageal reflux disease without esophagitis: Secondary | ICD-10-CM | POA: Insufficient documentation

## 2011-07-29 DIAGNOSIS — M129 Arthropathy, unspecified: Secondary | ICD-10-CM | POA: Insufficient documentation

## 2011-07-30 ENCOUNTER — Other Ambulatory Visit: Payer: Self-pay

## 2011-07-30 ENCOUNTER — Encounter (HOSPITAL_COMMUNITY): Payer: Self-pay | Admitting: Emergency Medicine

## 2011-07-30 ENCOUNTER — Emergency Department (HOSPITAL_COMMUNITY): Payer: Medicare Other

## 2011-07-30 DIAGNOSIS — I517 Cardiomegaly: Secondary | ICD-10-CM | POA: Diagnosis not present

## 2011-07-30 DIAGNOSIS — R062 Wheezing: Secondary | ICD-10-CM | POA: Diagnosis not present

## 2011-07-30 DIAGNOSIS — Z7982 Long term (current) use of aspirin: Secondary | ICD-10-CM | POA: Diagnosis not present

## 2011-07-30 DIAGNOSIS — M129 Arthropathy, unspecified: Secondary | ICD-10-CM | POA: Diagnosis not present

## 2011-07-30 DIAGNOSIS — R059 Cough, unspecified: Secondary | ICD-10-CM | POA: Diagnosis not present

## 2011-07-30 DIAGNOSIS — R0989 Other specified symptoms and signs involving the circulatory and respiratory systems: Secondary | ICD-10-CM | POA: Diagnosis not present

## 2011-07-30 DIAGNOSIS — K219 Gastro-esophageal reflux disease without esophagitis: Secondary | ICD-10-CM | POA: Diagnosis not present

## 2011-07-30 DIAGNOSIS — J40 Bronchitis, not specified as acute or chronic: Secondary | ICD-10-CM | POA: Diagnosis not present

## 2011-07-30 DIAGNOSIS — F329 Major depressive disorder, single episode, unspecified: Secondary | ICD-10-CM | POA: Diagnosis not present

## 2011-07-30 DIAGNOSIS — E039 Hypothyroidism, unspecified: Secondary | ICD-10-CM | POA: Diagnosis not present

## 2011-07-30 DIAGNOSIS — Z853 Personal history of malignant neoplasm of breast: Secondary | ICD-10-CM | POA: Diagnosis not present

## 2011-07-30 DIAGNOSIS — Z79899 Other long term (current) drug therapy: Secondary | ICD-10-CM | POA: Diagnosis not present

## 2011-07-30 DIAGNOSIS — I10 Essential (primary) hypertension: Secondary | ICD-10-CM | POA: Diagnosis not present

## 2011-07-30 DIAGNOSIS — E785 Hyperlipidemia, unspecified: Secondary | ICD-10-CM | POA: Diagnosis not present

## 2011-07-30 DIAGNOSIS — R05 Cough: Secondary | ICD-10-CM | POA: Diagnosis not present

## 2011-07-30 LAB — CBC
MCH: 30.7 pg (ref 26.0–34.0)
MCHC: 33.8 g/dL (ref 30.0–36.0)
MCV: 90.9 fL (ref 78.0–100.0)
Platelets: 158 10*3/uL (ref 150–400)
RDW: 13.6 % (ref 11.5–15.5)

## 2011-07-30 LAB — POCT I-STAT, CHEM 8
Hemoglobin: 13.9 g/dL (ref 12.0–15.0)
Sodium: 141 mEq/L (ref 135–145)
TCO2: 32 mmol/L (ref 0–100)

## 2011-07-30 LAB — DIFFERENTIAL
Basophils Absolute: 0 10*3/uL (ref 0.0–0.1)
Basophils Relative: 1 % (ref 0–1)
Eosinophils Absolute: 0.1 10*3/uL (ref 0.0–0.7)
Eosinophils Relative: 1 % (ref 0–5)

## 2011-07-30 LAB — POCT I-STAT TROPONIN I: Troponin i, poc: 0 ng/mL (ref 0.00–0.08)

## 2011-07-30 MED ORDER — IPRATROPIUM BROMIDE 0.02 % IN SOLN
0.5000 mg | Freq: Once | RESPIRATORY_TRACT | Status: AC
  Administered 2011-07-30: 0.5 mg via RESPIRATORY_TRACT
  Filled 2011-07-30: qty 2.5

## 2011-07-30 MED ORDER — DESLORATADINE 5 MG PO TABS: 5.0000 mg | ORAL_TABLET | Freq: Every day | ORAL | Status: DC

## 2011-07-30 MED ORDER — ALBUTEROL SULFATE (5 MG/ML) 0.5% IN NEBU
2.5000 mg | INHALATION_SOLUTION | Freq: Once | RESPIRATORY_TRACT | Status: AC
  Administered 2011-07-30: 2.5 mg via RESPIRATORY_TRACT
  Filled 2011-07-30: qty 0.5

## 2011-07-30 MED ORDER — IPRATROPIUM-ALBUTEROL 18-103 MCG/ACT IN AERO: 2.0000 | INHALATION_SPRAY | Freq: Four times a day (QID) | RESPIRATORY_TRACT | Status: DC | PRN

## 2011-07-30 MED ORDER — BENZONATATE 100 MG PO CAPS: 100.0000 mg | ORAL_CAPSULE | Freq: Three times a day (TID) | ORAL | Status: AC

## 2011-07-30 NOTE — ED Notes (Signed)
Pt requested chest xray and edp notified.

## 2011-07-30 NOTE — ED Provider Notes (Signed)
History     CSN: [id number]  Arrival date & time 07/29/11  2300   First MD Initiated Contact with Patient 07/30/11 0030      Chief Complaint  Patient presents with  . Cough    (Consider location/radiation/quality/duration/timing/severity/associated sxs/prior treatment) Patient is a 78 y.o. female presenting with cough. The history is provided by the patient. No language interpreter was used.  Cough This is a new problem. The current episode started more than 2 days ago. The problem occurs every few minutes. The problem has not changed since onset.The cough is non-productive. There has been no fever. Associated symptoms include wheezing. Pertinent negatives include no chest pain, no chills, no sweats, no weight loss and no sore throat. She has tried cough syrup (antibiotics) for the symptoms. The treatment provided no relief. Risk factors: sick contacts. She is not a smoker. Her past medical history does not include asthma.    Past Medical History  Diagnosis Date  . Morbid obesity   . MIGRAINE HEADACHE   . ARTHRITIS, KNEES, BILATERAL   . DEPRESSION   . GERD   . HYPERLIPIDEMIA   . HYPERTENSION   . Breast CA 2000    s/p R lumpectomy and XRT  . DJD (degenerative joint disease) of knee   . Urge incontinence   . HYPOTHYROIDISM     postsurgical  . OSA (obstructive sleep apnea) 01/17/2011 dx    Past Surgical History  Procedure Date  . Cholecystectomy 1995  . Breast lumpectomy 2000  . Thyroidectomy   . Breast biopsy 2000  . Replacement total knee bilateral 1999, 2005  . Tubal ligation   . Parathyroidectomy     2-3 removed  . Tonsillectomy   . Adenoidectomy   . Right leg femur repaired     Family History  Problem Relation Age of Onset  . Emphysema Sister     History  Substance Use Topics  . Smoking status: Former Smoker -- 2 years    Types: Cigarettes    Quit date: 06/19/1974  . Smokeless tobacco: Not on file   Comment: Quit in late 20's  . Alcohol Use: No   rare    OB History    Grav Para Term Preterm Abortions TAB SAB Ect Mult Living                  Review of Systems  Constitutional: Negative for chills and weight loss.  HENT: Negative.  Negative for sore throat.   Eyes: Negative.   Respiratory: Positive for cough and wheezing.   Cardiovascular: Negative for chest pain.  Genitourinary: Negative.   Musculoskeletal: Negative.   Neurological: Negative.   Hematological: Negative.   Psychiatric/Behavioral: Negative.     Allergies  Ciprofloxacin; Codeine; Epinephrine; Oxycodone; Paxil; and Propoxyphene hcl  Home Medications   Current Outpatient Rx  Name Route Sig Dispense Refill  . ASPIRIN 81 MG PO TABS Oral Take 81 mg by mouth daily.      [name] CITALOPRAM HYDROBROMIDE 10 MG PO TABS Oral Take 1 tablet (10 mg total) by mouth daily. 30 tablet 3  . DOXYCYCLINE HYCLATE 100 MG PO TABS Oral Take 1 tablet (100 mg total) by mouth 2 (two) times daily. 14 tablet 0  . FLUCONAZOLE 150 MG PO TABS  TAKE AS DIRECTED 2 tablet 0  . FUROSEMIDE 20 MG PO TABS Oral Take 1 tablet (20 mg total) by mouth daily as needed (for ankle swelling). 30 tablet 11  . HYDROCHLOROTHIAZIDE 25 MG PO TABS Oral  Take 0.5 tablets (12.5 mg total) by mouth daily. 30 tablet 6  . HYDROCODONE-HOMATROPINE 5-1.5 MG/5ML PO SYRP Oral Take 5 mLs by mouth every 6 (six) hours as needed for cough. 120 mL 0  . ISOMETHEPTENE-APAP-DICHLORAL 65-325-100 MG PO CAPS Oral Take 1 capsule by mouth every 4 (four) hours as needed. 30 capsule 2  . KLOR-CON 10 10 MEQ PO TBCR  TAKE 2 TABLETS (20 MEQ TOTAL) BY MOUTH DAILY. 180 tablet 3  . PANTOPRAZOLE SODIUM 20 MG PO TBEC  TAKE 1 TABLET BY MOUTH EVERY DAY 30 tablet 5  . PHENAZOPYRIDINE HCL 95 MG PO TABS Oral Take 95 mg by mouth daily.      [name] SYNTHROID 137 MCG PO TABS  TAKE 1 BY MOUTH ONCE DAILY 30 tablet 5  . ZOLPIDEM TARTRATE 10 MG PO TABS Oral Take 1 tablet (10 mg total) by mouth at bedtime as needed. 30 tablet 3  . IPRATROPIUM-ALBUTEROL 18-103 MCG/ACT  IN AERO Inhalation Inhale 2 puffs into the lungs every 6 (six) hours as needed for wheezing. 1 Inhaler 0  . BENZONATATE 100 MG PO CAPS Oral Take 1 capsule (100 mg total) by mouth every 8 (eight) hours. 21 capsule 0  . DESLORATADINE 5 MG PO TABS Oral Take 1 tablet (5 mg total) by mouth daily. 7 tablet 0    BP 140/58  Pulse 74  Temp(Src) 97.6 F (36.4 C) (Oral)  Resp 18  SpO2 94%  Physical Exam  Constitutional: She is oriented to person, place, and time. She appears well-developed and well-nourished.  HENT:  Head: Normocephalic and atraumatic.  Mouth/Throat: Oropharynx is clear and moist.  Eyes: Conjunctivae are normal. Pupils are equal, round, and reactive to light.  Neck: Normal range of motion. Neck supple. No tracheal deviation present.  Cardiovascular: Normal rate and regular rhythm.   Pulmonary/Chest: No stridor. She has wheezes.  Abdominal: Soft. Bowel sounds are normal. There is no tenderness. There is no rebound and no guarding.  Musculoskeletal: Normal range of motion. She exhibits no edema.  Lymphadenopathy:    She has no cervical adenopathy.  Neurological: She is alert and oriented to person, place, and time.  Skin: Skin is warm and dry. She is not diaphoretic.  Psychiatric: She has a normal mood and affect.    ED Course  Procedures (including critical care time)  Labs Reviewed  CBC - Abnormal; Notable for the following:    WBC 3.7 (*)    All other components within normal limits  DIFFERENTIAL - Abnormal; Notable for the following:    Monocytes Relative 13 (*)    All other components within normal limits  POCT I-STAT, CHEM 8 - Abnormal; Notable for the following:    Glucose, Bld 107 (*)    Calcium, Ion 1.08 (*)    All other components within normal limits  POCT I-STAT TROPONIN I   Dg Chest 2 View  07/30/2011  *RADIOLOGY REPORT*  Clinical Data: Cough and congestion  CHEST - 2 VIEW  Comparison: 01/15/2011  Findings: Cardiac enlargement with interstitial fibrosis  and peribronchial thickening suggesting chronic bronchitis.  No focal airspace consolidation.  No blunting of costophrenic angles.  No pneumothorax.  Surgical clips in the right breast and axilla. Degenerative changes in the spine and shoulders.  Stable compression deformities of lower thoracic vertebra.  IMPRESSION: No evidence of active disease.  Cardiac enlargement with chronic bronchitic changes.  Original Report Authenticated By: Neale Burly, M.D.     1. Bronchitis  MDM   Date: 07/30/2011  Rate: 81  Rhythm: normal sinus rhythm  QRS Axis: normal  Intervals: normal  ST/T Wave abnormalities: nonspecific ST changes  Conduction Disutrbances:none  Narrative Interpretation:   Old EKG Reviewed: none available   Return for chest pain shortness of breath or any concerns follow up in 2 days      Margeart Allender Alfonso Patten, MD 07/30/11 [phone]

## 2011-08-01 ENCOUNTER — Ambulatory Visit: Payer: Medicare Other | Admitting: Pulmonary Disease

## 2011-08-03 DIAGNOSIS — R059 Cough, unspecified: Secondary | ICD-10-CM | POA: Diagnosis not present

## 2011-08-03 DIAGNOSIS — J0110 Acute frontal sinusitis, unspecified: Secondary | ICD-10-CM | POA: Diagnosis not present

## 2011-08-03 DIAGNOSIS — R062 Wheezing: Secondary | ICD-10-CM | POA: Diagnosis not present

## 2011-08-14 DIAGNOSIS — M6281 Muscle weakness (generalized): Secondary | ICD-10-CM | POA: Diagnosis not present

## 2011-08-14 DIAGNOSIS — M25559 Pain in unspecified hip: Secondary | ICD-10-CM | POA: Diagnosis not present

## 2011-08-14 DIAGNOSIS — M129 Arthropathy, unspecified: Secondary | ICD-10-CM | POA: Diagnosis not present

## 2011-08-21 DIAGNOSIS — M129 Arthropathy, unspecified: Secondary | ICD-10-CM | POA: Diagnosis not present

## 2011-08-21 DIAGNOSIS — M6281 Muscle weakness (generalized): Secondary | ICD-10-CM | POA: Diagnosis not present

## 2011-08-21 DIAGNOSIS — M25559 Pain in unspecified hip: Secondary | ICD-10-CM | POA: Diagnosis not present

## 2011-08-23 ENCOUNTER — Other Ambulatory Visit: Payer: Self-pay | Admitting: Internal Medicine

## 2011-08-23 DIAGNOSIS — M6281 Muscle weakness (generalized): Secondary | ICD-10-CM | POA: Diagnosis not present

## 2011-08-23 DIAGNOSIS — M25559 Pain in unspecified hip: Secondary | ICD-10-CM | POA: Diagnosis not present

## 2011-08-23 DIAGNOSIS — M129 Arthropathy, unspecified: Secondary | ICD-10-CM | POA: Diagnosis not present

## 2011-08-25 DIAGNOSIS — M25559 Pain in unspecified hip: Secondary | ICD-10-CM | POA: Diagnosis not present

## 2011-08-25 DIAGNOSIS — M129 Arthropathy, unspecified: Secondary | ICD-10-CM | POA: Diagnosis not present

## 2011-08-25 DIAGNOSIS — M6281 Muscle weakness (generalized): Secondary | ICD-10-CM | POA: Diagnosis not present

## 2011-09-01 DIAGNOSIS — M25559 Pain in unspecified hip: Secondary | ICD-10-CM | POA: Diagnosis not present

## 2011-09-01 DIAGNOSIS — M6281 Muscle weakness (generalized): Secondary | ICD-10-CM | POA: Diagnosis not present

## 2011-09-01 DIAGNOSIS — M129 Arthropathy, unspecified: Secondary | ICD-10-CM | POA: Diagnosis not present

## 2011-09-04 DIAGNOSIS — M25559 Pain in unspecified hip: Secondary | ICD-10-CM | POA: Diagnosis not present

## 2011-09-04 DIAGNOSIS — M6281 Muscle weakness (generalized): Secondary | ICD-10-CM | POA: Diagnosis not present

## 2011-09-04 DIAGNOSIS — M129 Arthropathy, unspecified: Secondary | ICD-10-CM | POA: Diagnosis not present

## 2011-09-06 ENCOUNTER — Encounter: Payer: Self-pay | Admitting: Pulmonary Disease

## 2011-09-06 ENCOUNTER — Ambulatory Visit (INDEPENDENT_AMBULATORY_CARE_PROVIDER_SITE_OTHER): Payer: Medicare Other | Admitting: Pulmonary Disease

## 2011-09-06 VITALS — BP 116/74 | HR 80 | Temp 97.4°F | Ht 65.0 in | Wt 280.0 lb

## 2011-09-06 DIAGNOSIS — G4733 Obstructive sleep apnea (adult) (pediatric): Secondary | ICD-10-CM | POA: Diagnosis not present

## 2011-09-06 DIAGNOSIS — M6281 Muscle weakness (generalized): Secondary | ICD-10-CM | POA: Diagnosis not present

## 2011-09-06 DIAGNOSIS — M129 Arthropathy, unspecified: Secondary | ICD-10-CM | POA: Diagnosis not present

## 2011-09-06 DIAGNOSIS — M25559 Pain in unspecified hip: Secondary | ICD-10-CM | POA: Diagnosis not present

## 2011-09-06 NOTE — Patient Instructions (Signed)
Follow up in 6 months 

## 2011-09-06 NOTE — Assessment & Plan Note (Signed)
She reports benefit and compliance with therapy.  Explained the importance of using her machine whenever she is asleep, including during naps.

## 2011-09-06 NOTE — Progress Notes (Signed)
Chief Complaint  Patient presents with  . Follow-up    Pt states she wears her cpap everynight x 3-7 hrs a night. denies any problems with machine. pt states she is still hvaing problems with her mask--pt went to [facility] last week and her mask is not fluttering as bad. Pt is wearing her cpap w/ oxygen. Pt can tell a big difference now     History of Present Illness: Tamara Morales is a 78 y.o. female with severe OSA using CPAP 10 cm H2O and 2 liters oxygen.  She has been doing better since she got a new mask.  She sleeps for about 7 to 8 hours.  She will wake up after 5 to 6 hours to use the bathroom.  She will not always put her mask back on.  She takes a nap for an hour a day, but does not use her CPAP with naps.  She is not having any trouble with her mask.  She denies sinus congestion or dry mouth.  Auto CPAP 02/21/11 to 03/13/11>>Used on 20 of 21 nights with average 3 hrs 39 min. Average AHI 6.1 with median CPAP 9.5 cm H2O and 95th percentile CPAP 14 cm H2O.  ONO with RA and CPAP 04/21/11>>Test time 5 hr 12 min. Basal SpO2 87%, low SpO2 73%. Spent 161 min with SpO2 < 88%.  Past Medical History  Diagnosis Date  . Morbid obesity   . MIGRAINE HEADACHE   . ARTHRITIS, KNEES, BILATERAL   . DEPRESSION   . GERD   . HYPERLIPIDEMIA   . HYPERTENSION   . Breast CA 2000    s/p R lumpectomy and XRT  . DJD (degenerative joint disease) of knee   . Urge incontinence   . HYPOTHYROIDISM     postsurgical  . OSA (obstructive sleep apnea) 01/17/2011 dx    Past Surgical History  Procedure Date  . Cholecystectomy 1995  . Breast lumpectomy 2000  . Thyroidectomy   . Breast biopsy 2000  . Replacement total knee bilateral 1999, 2005  . Tubal ligation   . Parathyroidectomy     2-3 removed  . Tonsillectomy   . Adenoidectomy   . Right leg femur repaired     Allergies  Allergen Reactions  . Ciprofloxacin     REACTION: edgy  . Codeine     REACTION: Nausea$RemoveBeforeDE' \\T'[id number]$ \ vomitting  . Epinephrine    REACTION: rapid pulse, sweats  . Oxycodone Other (See Comments)    Patient felt like it altered her mental status "Crazy" . Hallucinations later as well.  [name] Paxil (Paroxetine Hydrochloride) Other (See Comments)    Sever depression   . Propoxyphene Hcl     REACTION: Nausea$RemoveBeforeDE' \\T'[id number]$ \ vomitting    Physical Exam:  Blood pressure 116/74, pulse 80, temperature 97.4 F (36.3 C), temperature source Oral, height $RemoveBefo'5\' 5"'pDUtqPoSLcV$  (1.651 m), weight 280 lb (127.007 kg), SpO2 94.00%. Body mass index is 46.59 kg/(m^2). Wt Readings from Last 2 Encounters:  09/06/11 280 lb (127.007 kg)  06/29/11 280 lb 6.4 oz (127.189 kg)    General - Obese HEENT - no sinus tenderness, no oral exudate, no LAN Cardiac - s1s2 regular Chest - no wheeze Abdomen - soft, nontender Extremities - ankle edema Skin - no rashes Neurologic - normal strength Psychiatric - normal mood, behavior   Assessment/Plan:  Outpatient Encounter Prescriptions as of 09/06/2011  Medication Sig Dispense Refill  . albuterol-ipratropium (COMBIVENT) 18-103 MCG/ACT inhaler Inhale 2 puffs into the lungs every 6 (six) hours as  needed for wheezing.  1 Inhaler  0  . aspirin 81 MG tablet Take 81 mg by mouth daily.        . citalopram (CELEXA) 10 MG tablet Take 1 tablet (10 mg total) by mouth daily.  30 tablet  3  . desloratadine (CLARINEX) 5 MG tablet Take 5 mg by mouth daily as needed.      . furosemide (LASIX) 20 MG tablet Take 1 tablet (20 mg total) by mouth daily as needed (for ankle swelling).  30 tablet  11  . hydrochlorothiazide (HYDRODIURIL) 25 MG tablet TAKE 1 TABLET EVERY DAY FOR BLOOD PRESSURE AND SWELLING  100 tablet  1  . isometheptene-acetaminophen-dichloralphenazone (MIDRIN) 65-325-100 MG capsule Take 1 capsule by mouth every 4 (four) hours as needed.  30 capsule  2  . KLOR-CON 10 10 MEQ CR tablet TAKE 2 TABLETS (20 MEQ TOTAL) BY MOUTH DAILY.  180 tablet  3  . pantoprazole (PROTONIX) 20 MG tablet TAKE 1 TABLET BY MOUTH EVERY DAY  30 tablet  5  .  phenazopyridine (AZO-TABS) 95 MG tablet Take 95 mg by mouth daily.        [name] SYNTHROID 137 MCG tablet TAKE 1 BY MOUTH ONCE DAILY  30 tablet  5  . zolpidem (AMBIEN) 10 MG tablet Take 1 tablet (10 mg total) by mouth at bedtime as needed.  30 tablet  3  . DISCONTD: desloratadine (CLARINEX) 5 MG tablet Take 1 tablet (5 mg total) by mouth daily.  7 tablet  0  . DISCONTD: fluconazole (DIFLUCAN) 150 MG tablet TAKE AS DIRECTED  2 tablet  0    Icelynn Onken Pager:  [phone] 09/06/2011, 3:47 PM

## 2011-09-18 ENCOUNTER — Other Ambulatory Visit: Payer: Self-pay | Admitting: Internal Medicine

## 2011-09-19 DIAGNOSIS — H04129 Dry eye syndrome of unspecified lacrimal gland: Secondary | ICD-10-CM | POA: Diagnosis not present

## 2011-09-21 DIAGNOSIS — E039 Hypothyroidism, unspecified: Secondary | ICD-10-CM | POA: Diagnosis not present

## 2011-09-21 DIAGNOSIS — M6281 Muscle weakness (generalized): Secondary | ICD-10-CM | POA: Diagnosis not present

## 2011-09-21 DIAGNOSIS — Z79899 Other long term (current) drug therapy: Secondary | ICD-10-CM | POA: Diagnosis not present

## 2011-09-21 DIAGNOSIS — R0789 Other chest pain: Secondary | ICD-10-CM | POA: Diagnosis not present

## 2011-09-21 DIAGNOSIS — I517 Cardiomegaly: Secondary | ICD-10-CM | POA: Diagnosis not present

## 2011-09-21 DIAGNOSIS — G4733 Obstructive sleep apnea (adult) (pediatric): Secondary | ICD-10-CM | POA: Diagnosis not present

## 2011-09-21 DIAGNOSIS — Z7982 Long term (current) use of aspirin: Secondary | ICD-10-CM | POA: Diagnosis not present

## 2011-09-21 DIAGNOSIS — I10 Essential (primary) hypertension: Secondary | ICD-10-CM | POA: Diagnosis not present

## 2011-09-21 DIAGNOSIS — R079 Chest pain, unspecified: Secondary | ICD-10-CM | POA: Diagnosis not present

## 2011-09-22 DIAGNOSIS — R0789 Other chest pain: Secondary | ICD-10-CM | POA: Diagnosis not present

## 2011-09-27 ENCOUNTER — Other Ambulatory Visit: Payer: Self-pay

## 2011-09-27 MED ORDER — PANTOPRAZOLE SODIUM 40 MG PO TBEC: 40.0000 mg | DELAYED_RELEASE_TABLET | Freq: Every day | ORAL | Status: DC

## 2011-09-27 NOTE — Telephone Encounter (Signed)
Ok, agree - order done

## 2011-09-27 NOTE — Telephone Encounter (Signed)
Pt called stating that she was seen in the ER out of town for what she thought was a cardiac event. Pt was advised that sxs were related to GERD and that she should increase Protonix to 40 mg. Pt is requesting a new Rx if MD agrees with advisement.

## 2011-10-05 DIAGNOSIS — Z961 Presence of intraocular lens: Secondary | ICD-10-CM | POA: Diagnosis not present

## 2011-10-05 DIAGNOSIS — H04129 Dry eye syndrome of unspecified lacrimal gland: Secondary | ICD-10-CM | POA: Diagnosis not present

## 2011-10-05 DIAGNOSIS — H01009 Unspecified blepharitis unspecified eye, unspecified eyelid: Secondary | ICD-10-CM | POA: Diagnosis not present

## 2011-10-19 ENCOUNTER — Telehealth: Payer: Self-pay | Admitting: Pulmonary Disease

## 2011-10-19 NOTE — Telephone Encounter (Signed)
I spoke with pt and she states in the very beginning of starting cpap she went over to the sleep lab for a mask refit and she was given a mask that was too big for her and since she has been there once doesn't want to go back. Please advise Dr. Halford Chessman, thanks

## 2011-10-19 NOTE — Telephone Encounter (Signed)
Please ask if she would be willing to have mask refit done at sleep lab to see if this works better.  If yes, then please place order.

## 2011-10-19 NOTE — Telephone Encounter (Signed)
I spoke with pt and she states that her cpap mask is driving her "nuts". C/o the cushion on mask is flapping and causing the air to seep through her mask. She can't sleep at night bc of this. She states this starts happening x 1 hour after putting the mask on. She has already been to [facility] twice for a mask fit and she has replaced the cushions on her mask. Pt is requesting further recs from [facility]. Please advise Dr. Halford Chessman, thanks

## 2011-10-23 NOTE — Telephone Encounter (Signed)
She has two different sizes of full face masks.  She was told by her DME to make the masks as tight as she can, and not to change it.  She feels CPAP and oxygen have helped, but her mask fit has been the main issue.  I discussed with her about proper fit for her mask.  Also explained that PAP therapy with oxygen is her best option given severity of sleep apnea and associated hypoventilation.  Explained that weight loss is the long-term goal, and that oral appliance may be an option at some point if she can lose enough weight.

## 2011-10-26 ENCOUNTER — Encounter: Payer: Self-pay | Admitting: Internal Medicine

## 2011-10-26 ENCOUNTER — Ambulatory Visit (INDEPENDENT_AMBULATORY_CARE_PROVIDER_SITE_OTHER): Payer: Medicare Other | Admitting: Internal Medicine

## 2011-10-26 VITALS — BP 120/82 | HR 86 | Temp 97.6°F | Ht 64.5 in | Wt 278.8 lb

## 2011-10-26 DIAGNOSIS — M7541 Impingement syndrome of right shoulder: Secondary | ICD-10-CM

## 2011-10-26 DIAGNOSIS — G4733 Obstructive sleep apnea (adult) (pediatric): Secondary | ICD-10-CM

## 2011-10-26 DIAGNOSIS — I10 Essential (primary) hypertension: Secondary | ICD-10-CM | POA: Diagnosis not present

## 2011-10-26 DIAGNOSIS — M25819 Other specified joint disorders, unspecified shoulder: Secondary | ICD-10-CM | POA: Diagnosis not present

## 2011-10-26 DIAGNOSIS — D229 Melanocytic nevi, unspecified: Secondary | ICD-10-CM

## 2011-10-26 MED ORDER — MELOXICAM 15 MG PO TABS: 15.0000 mg | ORAL_TABLET | Freq: Every day | ORAL | Status: DC

## 2011-10-26 NOTE — Assessment & Plan Note (Signed)
Wt Readings from Last 3 Encounters:  10/26/11 278 lb 12.8 oz (126.463 kg)  09/06/11 280 lb (127.007 kg)  06/29/11 280 lb 6.4 oz (127.189 kg)   Encouragement and education provided today on diet and exercise needs for optimization of weight and health

## 2011-10-26 NOTE — Progress Notes (Signed)
Subjective:    Patient ID: Tamara Morales, female    DOB: February 16, 1931, 78 y.o.   MRN: [id number]  HPI  here for follow up - reviewed chronic medical issues:  hypothyroid - related to surg resection for goiter when hyperparathyroid glands (3 of 4) removed - no hx thyroid cancer - feels fatigued and complains of brittle nails when level is off - chronic weight gain - no bowel changes  hypertension - diovan rx'd 04/2010 but caused nausea and weak feeling - changed to losartan 08/2010 with hctz as needed, titrated to max but still high in AM - reports compliance with ongoing medical treatment and denies adverse side effects related to current therapy: no chest pain, edema or headache; no hx CVA or CAD (neg cath 03/2010 hosp for CP) -   dyslipidemia - previous on lipitor but stopped 07/2009 (pt unclear as to why) - takes fish oil - denies muscle weakenss or GI upset -  GERD with reflux - reports compliance with ongoing medical treatment and no recent change in medication (on pantoprotazole). denies adverse side effects related to current therapy.    OA, B knees, B shoulders R>L - s/p B TKR - tries to keeps active walking but increasing pain in RUE/shoulder (side where she uses cane) - s/p PT tx at [facility] - ? other pain meds when tylenol ineffective  depression - working with counselor susan bond due to life stressors; also on SSRI citalopam since 12/12 but takes only "prn" - feels improved -  obesity - fustrated with weight gain since spring 2011 - cutting calories but would like more diet help  Past Medical History  Diagnosis Date  . Morbid obesity   . MIGRAINE HEADACHE   . ARTHRITIS, KNEES, BILATERAL   . DEPRESSION   . GERD   . HYPERLIPIDEMIA   . HYPERTENSION   . Breast CA 2000    s/p R lumpectomy and XRT  . DJD (degenerative joint disease) of knee   . Urge incontinence   . HYPOTHYROIDISM     postsurgical  . OSA (obstructive sleep apnea) 01/17/2011 dx    Review of Systems    Respiratory: Negative for cough and shortness of breath.   Cardiovascular: Negative for chest pain and leg swelling.  Neurological: Negative for weakness and headaches.      Objective:   Physical Exam  BP 120/82  Pulse 86  Temp(Src) 97.6 F (36.4 C) (Oral)  Ht 5' 4.5" (1.638 m)  Wt 278 lb 12.8 oz (126.463 kg)  BMI 47.12 kg/m2  SpO2 95% Wt Readings from Last 3 Encounters:  10/26/11 278 lb 12.8 oz (126.463 kg)  09/06/11 280 lb (127.007 kg)  06/29/11 280 lb 6.4 oz (127.189 kg)   Constitutional: She is obese; appears well-developed and well-nourished. No distress. Neck: Normal range of motion. Neck supple. No JVD present. No thyromegaly present.  Cardiovascular: Normal rate, regular rhythm and normal heart sounds.  No murmur heard. trace edema BLE. Pulmonary/Chest: Effort normal and breath sounds normal. No respiratory distress. She has no wheezes.  MSkel: R shoulder decreased range of motion on forward flexion, abduction, and internal rotation. Positive impingement signs. Decreased strength with stressing of rotator cuff. Pain with crossed arm adduction. referred pain into distal deltoid. Tender over a.c. joint and subacromial. Neurological: She is alert and oriented to person, place, and time. No cranial nerve deficit. Coordination normal.  Skin: multiple SK back and anterior chest - requests derm eval for tx same Psychiatric: She has  a normal mood and affect. Her behavior is normal. Judgment and thought content normal.   Lab Results  Component Value Date   WBC 3.7* 07/30/2011   HGB 13.9 07/30/2011   HCT 41.0 07/30/2011   PLT 158 07/30/2011   CHOL 189 08/23/2010   TRIG 167.0* 08/23/2010   HDL 43.20 08/23/2010   ALT 10 01/02/2011   AST 13 01/02/2011   NA 141 07/30/2011   K 3.7 07/30/2011   CL 101 07/30/2011   CREATININE 1.00 07/30/2011   BUN 21 07/30/2011   CO2 27 01/02/2011   TSH 3.90 06/29/2011   INR 1.02 03/20/2010   HGBA1C 5.9 06/29/2011       Assessment & Plan:  See problem list.  Medications and labs reviewed today.  R shoulder impingement - no trauma but cannot exclude partial tear - pt declines injection today or ortho refer at this time - start daily meloxicam and call if worse or unimproved   SK - reassurance provided - refer to der per pt request for eval/tx prn

## 2011-10-26 NOTE — Patient Instructions (Addendum)
It was good to see you today. We have reviewed your prior records including labs and tests today Medications reviewed, start meloxicam daily for shoulder and arthritis pains -  Your prescription(s) have been submitted to your pharmacy. Please take as directed and contact our office if you believe you are having problem(s) with the medication(s). No additional changes at this time. we'll make referral to dermatology . Our office will contact you regarding appointment(s) once made. Please schedule followup in 4 months for blood pressure and weight check, call sooner if problems.  Wt Readings from Last 3 Encounters:  10/26/11 278 lb 12.8 oz (126.463 kg)  09/06/11 280 lb (127.007 kg)  06/29/11 280 lb 6.4 oz (127.189 kg)

## 2011-10-26 NOTE — Assessment & Plan Note (Signed)
Severe dz - working with pulm on same to find right mask

## 2011-10-26 NOTE — Assessment & Plan Note (Signed)
Home bp log reviewed - variable BP Prior poor tolerance of diovan but tolerating losartan without adv SE- Titrate to BP goal at this time, continue12.5 hctz sched and prn-  BP Readings from Last 3 Encounters:  10/26/11 120/82  09/06/11 116/74  07/30/11 140/58

## 2011-11-19 ENCOUNTER — Other Ambulatory Visit: Payer: Self-pay | Admitting: Internal Medicine

## 2011-11-20 DIAGNOSIS — D485 Neoplasm of uncertain behavior of skin: Secondary | ICD-10-CM | POA: Diagnosis not present

## 2011-11-20 DIAGNOSIS — D235 Other benign neoplasm of skin of trunk: Secondary | ICD-10-CM | POA: Diagnosis not present

## 2011-11-22 ENCOUNTER — Ambulatory Visit (INDEPENDENT_AMBULATORY_CARE_PROVIDER_SITE_OTHER): Payer: Medicare Other | Admitting: Pulmonary Disease

## 2011-11-22 ENCOUNTER — Encounter: Payer: Self-pay | Admitting: Pulmonary Disease

## 2011-11-22 VITALS — BP 114/76 | HR 76 | Temp 98.3°F | Ht 66.0 in | Wt 281.6 lb

## 2011-11-22 DIAGNOSIS — G4733 Obstructive sleep apnea (adult) (pediatric): Secondary | ICD-10-CM | POA: Diagnosis not present

## 2011-11-22 NOTE — Patient Instructions (Signed)
Follow up in 6 months 

## 2011-11-22 NOTE — Assessment & Plan Note (Signed)
She reports benefit and compliance with therapy.  Explained the importance of using her machine whenever she is asleep, including during naps.  She is to continue CPAP with 2 liters supplemental oxygen at night.

## 2011-11-22 NOTE — Progress Notes (Signed)
Chief Complaint  Patient presents with  . Follow-up    Pt uses her oxygen 2liters oxygen at bedtime w/ cpap. Pt wears her cpap machine everynight x 2-6 hrs a night.     History of Present Illness: Tamara Morales is a 77 y.o. female with severe OSA using CPAP 10 cm H2O and 2 liters oxygen.  She has been sleeping better since she got her new mask.  She uses CPAP every night for at least 5 to 6 hours.  She feels more energetic during the day.  ONO with RA and CPAP 04/21/11>>Test time 5 hr 12 min. Basal SpO2 87%, low SpO2 73%. Spent 161 min with SpO2 < 88%.  Past Medical History  Diagnosis Date  . Morbid obesity   . MIGRAINE HEADACHE   . ARTHRITIS, KNEES, BILATERAL   . DEPRESSION   . GERD   . HYPERLIPIDEMIA   . HYPERTENSION   . Breast CA 2000    s/p R lumpectomy and XRT  . DJD (degenerative joint disease) of knee   . Urge incontinence   . HYPOTHYROIDISM     postsurgical  . OSA (obstructive sleep apnea) 01/17/2011 dx    Past Surgical History  Procedure Date  . Cholecystectomy 1995  . Breast lumpectomy 2000  . Thyroidectomy   . Breast biopsy 2000  . Replacement total knee bilateral 1999, 2005  . Tubal ligation   . Parathyroidectomy     2-3 removed  . Tonsillectomy   . Adenoidectomy   . Right leg femur repaired    Allergies  Allergen Reactions  . Ciprofloxacin     REACTION: edgy  . Codeine     REACTION: Nausea \\T \ vomitting  . Epinephrine     REACTION: rapid pulse, sweats  . Oxycodone Other (See Comments)    Patient felt like it altered her mental status "Crazy" . Hallucinations later as well.  [name] Paxil (Paroxetine Hydrochloride) Other (See Comments)    Sever depression   . Propoxyphene Hcl     REACTION: Nausea \\T \ vomitting    Physical Exam: Filed Vitals:   11/22/11 1138 11/22/11 1141  BP:  114/76  Pulse:  76  Temp: 98.3 F (36.8 C)   TempSrc: Oral   Height: 5\' 6"  (1.676 m)   Weight: 281 lb 9.6 oz (127.733 kg)   SpO2:  94%  ,  Current Encounter  SPO2  11/22/11 1141 94%  10/26/11 1111 95%  09/06/11 1522 94%   Wt Readings from Last 3 Encounters:  11/22/11 281 lb 9.6 oz (127.733 kg)  10/26/11 278 lb 12.8 oz (126.463 kg)  09/06/11 280 lb (127.007 kg)   Body mass index is 45.45 kg/(m^2).  General - No distress ENT - Ears normal. Throat and pharynx normal. Neck supple. No adenopathy or masses in the neck or supraclavicular regions. Nasal septum midline, no deformities, nares patent, normal mucosa without swelling, no polyps, no bleeding. Paranasal sinuses are normal. No tenderness to the maxillary, frontal, ethmoids or mastoids. Cardiac - S1 and S2 normal, no murmurs, clicks, gallops or rubs. Regular rate and rhythm.  No edema or JVD. Chest - Chest is clear, no wheezing or rales. Normal symmetric air entry throughout both lung fields. No chest wall deformities or tenderness. Back - Cervical, thoracic and lumbar spine exam is normal without tenderness, masses or kyphoscoliosis. Full range of motion without pain is noted. Abd - soft without tenderness, guarding, mass, rebound or organomegaly. Bowel sounds are normal. No CVA tenderness noted. Ext -  good pulses, motor strength and sensation. Neuro - Cranial nerves are normal. PERLA. EOM's intact. Skin - no discernible active dermatitis, erythema, urticaria or inflammatory process. Psych - normal mood, and behavior.    Assessment/Plan:  Chesley Mires, MD [facility] Pager:  [phone] 11/22/2011, 12:08 PM  Patient Instructions  Follow up in 6 months

## 2011-11-30 ENCOUNTER — Other Ambulatory Visit: Payer: Self-pay | Admitting: Internal Medicine

## 2011-12-01 DIAGNOSIS — H52209 Unspecified astigmatism, unspecified eye: Secondary | ICD-10-CM | POA: Diagnosis not present

## 2011-12-01 DIAGNOSIS — H01009 Unspecified blepharitis unspecified eye, unspecified eyelid: Secondary | ICD-10-CM | POA: Diagnosis not present

## 2011-12-01 DIAGNOSIS — H04129 Dry eye syndrome of unspecified lacrimal gland: Secondary | ICD-10-CM | POA: Diagnosis not present

## 2011-12-12 ENCOUNTER — Other Ambulatory Visit: Payer: Self-pay | Admitting: Internal Medicine

## 2012-01-04 DIAGNOSIS — S63639A Sprain of interphalangeal joint of unspecified finger, initial encounter: Secondary | ICD-10-CM | POA: Diagnosis not present

## 2012-01-04 DIAGNOSIS — S63259A Unspecified dislocation of unspecified finger, initial encounter: Secondary | ICD-10-CM | POA: Diagnosis not present

## 2012-01-08 DIAGNOSIS — M6530 Trigger finger, unspecified finger: Secondary | ICD-10-CM | POA: Diagnosis not present

## 2012-01-08 DIAGNOSIS — M25569 Pain in unspecified knee: Secondary | ICD-10-CM | POA: Diagnosis not present

## 2012-01-15 DIAGNOSIS — S63259A Unspecified dislocation of unspecified finger, initial encounter: Secondary | ICD-10-CM | POA: Diagnosis not present

## 2012-01-23 DIAGNOSIS — H01009 Unspecified blepharitis unspecified eye, unspecified eyelid: Secondary | ICD-10-CM | POA: Diagnosis not present

## 2012-01-23 DIAGNOSIS — H04129 Dry eye syndrome of unspecified lacrimal gland: Secondary | ICD-10-CM | POA: Diagnosis not present

## 2012-01-26 ENCOUNTER — Other Ambulatory Visit: Payer: Self-pay | Admitting: *Deleted

## 2012-01-26 MED ORDER — ISOMETHEPTENE-APAP-DICHLORAL 65-325-100 MG PO CAPS: 1.0000 | ORAL_CAPSULE | ORAL | Status: DC | PRN

## 2012-01-26 NOTE — Telephone Encounter (Signed)
F[facility]ed [facility] to [facility]... 01/26/12$RemoveBeforeD'@3'tTtMLemVFgClsB$ :21pm/LMB

## 2012-02-08 DIAGNOSIS — S63259A Unspecified dislocation of unspecified finger, initial encounter: Secondary | ICD-10-CM | POA: Diagnosis not present

## 2012-02-25 ENCOUNTER — Other Ambulatory Visit: Payer: Self-pay | Admitting: Internal Medicine

## 2012-02-29 ENCOUNTER — Ambulatory Visit: Payer: Medicare Other | Admitting: Internal Medicine

## 2012-03-05 DIAGNOSIS — Z23 Encounter for immunization: Secondary | ICD-10-CM | POA: Diagnosis not present

## 2012-03-13 ENCOUNTER — Telehealth: Payer: Self-pay | Admitting: General Practice

## 2012-03-13 ENCOUNTER — Ambulatory Visit (INDEPENDENT_AMBULATORY_CARE_PROVIDER_SITE_OTHER): Payer: Medicare Other | Admitting: Internal Medicine

## 2012-03-13 ENCOUNTER — Encounter: Payer: Self-pay | Admitting: Internal Medicine

## 2012-03-13 ENCOUNTER — Other Ambulatory Visit (INDEPENDENT_AMBULATORY_CARE_PROVIDER_SITE_OTHER): Payer: Medicare Other

## 2012-03-13 VITALS — BP 124/82 | HR 91 | Temp 97.0°F | Resp 15 | Wt 279.5 lb

## 2012-03-13 DIAGNOSIS — R0789 Other chest pain: Secondary | ICD-10-CM

## 2012-03-13 DIAGNOSIS — E039 Hypothyroidism, unspecified: Secondary | ICD-10-CM

## 2012-03-13 DIAGNOSIS — I10 Essential (primary) hypertension: Secondary | ICD-10-CM

## 2012-03-13 DIAGNOSIS — E785 Hyperlipidemia, unspecified: Secondary | ICD-10-CM

## 2012-03-13 DIAGNOSIS — Z9010 Acquired absence of unspecified breast and nipple: Secondary | ICD-10-CM | POA: Insufficient documentation

## 2012-03-13 LAB — BASIC METABOLIC PANEL
BUN: 16 mg/dL (ref 6–23)
CO2: 31 mEq/L (ref 19–32)
Calcium: 8.5 mg/dL (ref 8.4–10.5)
GFR: 49.81 mL/min — ABNORMAL LOW (ref >60.00)
Glucose, Bld: 95 mg/dL (ref 70–99)
Potassium: 3.6 mEq/L (ref 3.5–5.1)

## 2012-03-13 LAB — CBC WITH DIFFERENTIAL/PLATELET
Basophils Absolute: 0 10*3/uL (ref 0.0–0.1)
Eosinophils Absolute: 0.2 10*3/uL (ref 0.0–0.7)
HCT: 41.8 % (ref 36.0–46.0)
Lymphs Abs: 1.6 10*3/uL (ref 0.7–4.0)
MCHC: 33 g/dL (ref 30.0–36.0)
Monocytes Absolute: 0.6 10*3/uL (ref 0.1–1.0)
Monocytes Relative: 8.1 % (ref 3.0–12.0)
Neutro Abs: 4.6 10*3/uL (ref 1.4–7.7)
Platelets: 181 10*3/uL (ref 150.0–400.0)
RDW: 14.1 % (ref 11.5–14.6)

## 2012-03-13 LAB — URINALYSIS, ROUTINE W REFLEX MICROSCOPIC
Bilirubin Urine: NEGATIVE
Ketones, ur: NEGATIVE
Nitrite: NEGATIVE
Urobilinogen, UA: 0.2 (ref 0.0–1.0)
pH: 6 (ref 5.0–8.0)

## 2012-03-13 LAB — HEPATIC FUNCTION PANEL
ALT: 12 U/L (ref 0–35)
AST: 16 U/L (ref 0–37)
Bilirubin, Direct: 0.1 mg/dL (ref 0.0–0.3)
Total Bilirubin: 0.6 mg/dL (ref 0.3–1.2)

## 2012-03-13 LAB — LIPID PANEL: Total CHOL/HDL Ratio: 5

## 2012-03-13 LAB — LDL CHOLESTEROL, DIRECT: Direct LDL: 99.4 mg/dL

## 2012-03-13 NOTE — Telephone Encounter (Signed)
Called [facility], spoke with Magda Paganini in [place] to [employer]. Was told that per Medicare an Rx needs to be sent in stating "Lumpectomy Supplies".  Can fax to [phone].

## 2012-03-13 NOTE — Patient Instructions (Addendum)
It was good to see you today. Test(s) ordered today. Your results will be released to [place] (or called to you) after review, usually within 72hours after test completion. If any changes need to be made, you will be notified at that same time. we'll make referral for cardiac stress test. Our office will contact you regarding appointment(s) once made. Other tests to be determined based on these results Medications reviewed, no changes at this time.  Will contact [facility] as requested for lumpectomy supplies

## 2012-03-13 NOTE — Assessment & Plan Note (Signed)
Home bp log reviewed - variable BP Prior poor tolerance of diovan but tolerating losartan and HCTZ without adverse side effects  The current medical regimen is effective;  continue present plan and medications.   BP Readings from Last 3 Encounters:  03/13/12 124/82  11/22/11 114/76  10/26/11 120/82

## 2012-03-13 NOTE — Assessment & Plan Note (Signed)
Lab Results  Component Value Date   TSH 3.90 06/29/2011   The current medical regimen is effective;  continue present plan and medications. Check tsh semi annually and adjust as needed

## 2012-03-13 NOTE — Assessment & Plan Note (Signed)
Prior PCP stopped Lipitor 07/2009 - reminded to work on diet/exercise and weight control to manage same Check lipids annually  Lab Results  Component Value Date   LDLCALC 112* 08/23/2010

## 2012-03-13 NOTE — Progress Notes (Signed)
Subjective:    Patient ID: Tamara Morales, female    DOB: [birth year], 78 y.o.   MRN: [id number]  HPI  here for "gas pain"  Onset 1.5 week ago - episodes last approx 69min before spontaneous resolution Located SS chest and radiates to R anterior shoulder  associated with diffuse abdominal "bloat" and discomfort, also flatuelence and belching No bowel change, nausea and vomiting  Improved with ginger ale Pain severe cramp when present - 2-3 episodes per day since onset  Also reviewed chronic medical issues:  hypothyroid - related to surgical resection for goiter when hyperparathyroid glands (3 of 4) removed - no hx thyroid cancer - feels fatigued and complains of brittle nails when level "is off" - chronic weight gain - no bowel changes  hypertension - diovan rx'd 04/2010 but caused nausea and weak feeling - changed to losartan 08/2010 with hctz as needed, titrated to max but still high in AM - reports compliance with ongoing medical treatment and denies adverse side effects related to current therapy: no chest pain, edema or headache; no history of stroke, coronary artery disease (neg cath 03/2010 hosp for chest pain) -   dyslipidemia - previous on lipitor but stopped 07/2009 (pt unclear as to why) - takes fish oil - denies muscle weakenss or GI upset -  GERD with reflux - reports compliance with ongoing medical treatment and no recent change in medication (on pantoprotazole). denies adverse side effects related to current therapy.    OA, B knees, B shoulders R>L - s/p B TKR - tries to keeps active walking but increasing pain in RUE/shoulder (side where she uses cane) - s/p PT tx at [facility] facility -   depression - working with counselor susan bond due to life stressors; also on SSRI citalopam since 12/12 but takes only "prn" - feels improved -   Past Medical History  Diagnosis Date  . Morbid obesity   . MIGRAINE HEADACHE   . ARTHRITIS, KNEES, BILATERAL   . DEPRESSION   . GERD   .  HYPERLIPIDEMIA   . HYPERTENSION   . Breast CA 2000    s/p R lumpectomy and XRT  . DJD (degenerative joint disease) of knee   . Urge incontinence   . HYPOTHYROIDISM     postsurgical  . OSA (obstructive sleep apnea) 01/17/2011 dx   Family History  Problem Relation Age of Onset  . Emphysema Sister    History  Substance Use Topics  . Smoking status: Former Smoker -- 2 years    Types: Cigarettes    Quit date: 06/19/1974  . Smokeless tobacco: Not on file   Comment: Quit in late 20's  . Alcohol Use: No     rare    Review of Systems  Respiratory: Negative for cough and shortness of breath.   Cardiovascular: Positive for chest pain. Negative for palpitations and leg swelling.  Gastrointestinal: Positive for abdominal distention. Negative for nausea, vomiting, diarrhea, constipation and blood in stool.  Musculoskeletal: Positive for arthralgias. Negative for joint swelling.  Neurological: Negative for weakness and headaches.  No other specific complaints in a complete review of systems (except as listed in HPI above).     Objective:   Physical Exam  BP 124/82  Pulse 91  Temp 97 F (36.1 C) (Oral)  Resp 15  Wt 279 lb 8 oz (126.78 kg)  SpO2 96% Wt Readings from Last 3 Encounters:  03/13/12 279 lb 8 oz (126.78 kg)  11/22/11 281 lb 9.6 oz (127.733  kg)  10/26/11 278 lb 12.8 oz (126.463 kg)   Constitutional: She is obese; appears well-developed and well-nourished. No distress. Neck: Normal range of motion. Neck supple. No JVD present. No thyromegaly present.  Cardiovascular: Normal rate, regular rhythm and normal heart sounds.  No murmur heard. trace edema BLE. Pulmonary/Chest: Effort normal and breath sounds normal. No respiratory distress. She has no wheezes.  Abdomen:  obese, SNTND, +BS - no r/g Psychiatric: She has a normal mood and affect. Her behavior is normal. Judgment and thought content normal.   Lab Results  Component Value Date   WBC 3.7* 07/30/2011   HGB 13.9  07/30/2011   HCT 41.0 07/30/2011   PLT 158 07/30/2011   CHOL 189 08/23/2010   TRIG 167.0* 08/23/2010   HDL 43.20 08/23/2010   ALT 10 01/02/2011   AST 13 01/02/2011   NA 141 07/30/2011   K 3.7 07/30/2011   CL 101 07/30/2011   CREATININE 1.00 07/30/2011   BUN 21 07/30/2011   CO2 27 01/02/2011   TSH 3.90 06/29/2011   INR 1.02 03/20/2010   HGBA1C 5.9 06/29/2011     ECG - no change from 07/2011 - NSR @ 83 bpm - no ischemic changes   Assessment & Plan:  Atypical chest pain, history concerning for possible angina given waxing and waning episodes without relation to food. However atypical symptoms include relief with belching and syndrome. EKG today unchanged, check labs and refer for cardiac stress test. No medication changes at this time pending further evaluation  Also see problem list. Medications and labs reviewed today.

## 2012-03-15 NOTE — Telephone Encounter (Signed)
Fax sent to [facility].

## 2012-03-29 ENCOUNTER — Other Ambulatory Visit: Payer: Self-pay | Admitting: Internal Medicine

## 2012-03-29 ENCOUNTER — Ambulatory Visit (HOSPITAL_COMMUNITY): Payer: Medicare Other | Attending: Internal Medicine

## 2012-03-29 ENCOUNTER — Ambulatory Visit (HOSPITAL_COMMUNITY): Payer: Medicare Other | Attending: Cardiovascular Disease

## 2012-03-29 DIAGNOSIS — E785 Hyperlipidemia, unspecified: Secondary | ICD-10-CM

## 2012-03-29 DIAGNOSIS — I10 Essential (primary) hypertension: Secondary | ICD-10-CM

## 2012-03-29 DIAGNOSIS — R0989 Other specified symptoms and signs involving the circulatory and respiratory systems: Secondary | ICD-10-CM

## 2012-03-29 DIAGNOSIS — R0789 Other chest pain: Secondary | ICD-10-CM

## 2012-03-29 NOTE — Progress Notes (Signed)
Dobutamine Stress Echo not performed due to patient's hypertensive state. BP on arrival 148/105.  Pre-test BP 156/107. DOD notified of patients status. Test cancelled per Dr. Angelena Form ( DOD).   NR.

## 2012-03-30 ENCOUNTER — Ambulatory Visit (INDEPENDENT_AMBULATORY_CARE_PROVIDER_SITE_OTHER): Payer: Medicare Other | Admitting: Family Medicine

## 2012-03-30 ENCOUNTER — Encounter: Payer: Self-pay | Admitting: Family Medicine

## 2012-03-30 VITALS — BP 140/80 | HR 80 | Temp 97.7°F | Wt 277.0 lb

## 2012-03-30 DIAGNOSIS — R0609 Other forms of dyspnea: Secondary | ICD-10-CM

## 2012-03-30 DIAGNOSIS — R0789 Other chest pain: Secondary | ICD-10-CM

## 2012-03-30 DIAGNOSIS — R0600 Dyspnea, unspecified: Secondary | ICD-10-CM

## 2012-03-30 MED ORDER — AMLODIPINE BESYLATE 2.5 MG PO TABS: 2.5000 mg | ORAL_TABLET | Freq: Every day | ORAL | Status: DC

## 2012-03-30 NOTE — Patient Instructions (Addendum)
EKG overall ok today. Pass by Tamara Morales for labwork - we will call you with results at [phone]. Start amlodipine at 2.$RemoveBeforeD'5mg'ElHRdRHjifvyPx$  daily - sent into pharmacy. If at any point worsening chest tightness or shortness of breath, please seek ER evaluation.

## 2012-03-30 NOTE — Assessment & Plan Note (Addendum)
?  worsening allergic rhinitis leading to her feeling poorly and elevating BP.  Already on clarinex and benadryl.  May continue this as well as nasal saline. Does have risk factors for cardiac cause - currently uncontrolled HTN, obesity - but overall very atypical.  Agree will need further outpt cardiac risk stratification. In meantime, will check stat TnI today and call pt with results. For uncontrolled HTN - start amlodipine 2.5mg  daily along with her current regimen of HCTZ 25mg  daily.  Uses lasix prn leg swelling.  Did not start ACEI 2/2 mild renal insufficiency present. For dyspnea - check BNP. Doubt PE - good O2 sat, only risk factor seems to be obesity. Did discuss in detail red flags to seek emergent ER evaluation.  EKG - NSR, rate 75, normal axis and intervals, no acute ST/T changes.  overall unchanged compared to prior EKG 03/13/2012, some new T wave inversion lead III but not contiguous.  ==> received stat labs back.  TnI <0.3.  BNP 309.  Called pt x3, no answer  Will keep trying - to recommend take lasix daily for next 3 days and start amlodipine.  If worsening, to ER.  ==>Was able to reach pt - she states she slept this afternoon and is feeling better.  Asked her to call PCP next week with log of blood pressures.

## 2012-03-30 NOTE — Progress Notes (Signed)
Subjective:    Patient ID: Tamara Morales, female    DOB: Oct 19, 1930, 78 y.o.   MRN: [id number]  HPI CC: cough  Last seen by PCP 03/13/2012 with concern for atypical chest pain - dobutamine stress echo test scheduled for yesterday but cancelled 2/2 elevated BP readings (156/107).  Reviewed prior notes.  Presents today to Saturday clinic with concerns of what happened yesterday.  Felt very tired as well - hasn't been sleeping well.  Hasn't been feeling well for last few days.  Having dry cough, some chest tightness with this.  Attributes both of these things to seasonal allergies.  Now feeling substernal chest tightness associated with mild dyspnea, but not relieved by rest.  Unsure if exertional as does not regularly exercise.  Comes and goes intermittently.  Last happened this morning with high blood pressure readings, none currently.  No nausea/vomiting or diaphoresis associated with this.  Appetite down recently.  + increased dyspnea on exertion recently.  H/o OSA, on nightly CPAP.   This morning when awoke, took BP and was high: 6:00am 193/114 6:45am 145/85 7:30am 172/94  Denies fevers/chills, chest pain, HA, dizziness, nausea/vomiting, abd pain, diarrhea, constipation, dysuria.  No leg swelling recently.  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  Does tend to get seasonal allergy flares.  No recent prolonged immobility.  No current hormonal medicines.  No personal or family hx blood clots.  Non smoker. No personal or family history of CAD. Pt states she had normal heart catheterization about 3 years ago.  As far as HTN - reports compliance with med - only on HCTZ $Remov'25mg'vuZxGL$  daily.  Rarely takes lasix $RemoveBef'20mg'jTAYqqlJjG$ . Lab Results  Component Value Date   CHOL 175 03/13/2012   HDL 37.60* 03/13/2012   LDLCALC 112* 08/23/2010   LDLDIRECT 99.4 03/13/2012   TRIG 217.0* 03/13/2012   CHOLHDL 5 03/13/2012   Lab Results  Component Value Date   CREATININE 1.1 03/13/2012    Wt Readings from Last 3 Encounters:    03/30/12 277 lb (125.646 kg)  03/13/12 279 lb 8 oz (126.78 kg)  11/22/11 281 lb 9.6 oz (127.733 kg)    BP Readings from Last 3 Encounters:  03/30/12 140/80  03/13/12 124/82  11/22/11 114/76   Past Medical History  Diagnosis Date  . Morbid obesity   . MIGRAINE HEADACHE   . ARTHRITIS, KNEES, BILATERAL   . DEPRESSION   . GERD   . HYPERLIPIDEMIA   . HYPERTENSION   . Breast CA 2000    s/p R lumpectomy and XRT  . DJD (degenerative joint disease) of knee   . Urge incontinence   . HYPOTHYROIDISM     postsurgical  . OSA (obstructive sleep apnea) 01/17/2011 dx    Past Surgical History  Procedure Date  . Cholecystectomy 1995  . Breast lumpectomy 2000  . Thyroidectomy   . Breast biopsy 2000  . Replacement total knee bilateral 1999, 2005  . Tubal ligation   . Parathyroidectomy     2-3 removed  . Tonsillectomy   . Adenoidectomy   . Right leg femur repaired    Family History  Problem Relation Age of Onset  . Emphysema Sister    Current Outpatient Prescriptions on File Prior to Visit  Medication Sig Dispense Refill  . acetaminophen (TYLENOL) 500 MG tablet Take 500 mg by mouth as needed.      [name] aspirin 81 MG tablet Take 81 mg by mouth daily.        . hydrochlorothiazide (HYDRODIURIL) 25 MG tablet  TAKE 1 TABLET EVERY DAY FOR BLOOD PRESSURE AND SWELLING  100 tablet  1  . KLOR-CON 10 10 MEQ tablet TAKE 2 TABLETS (20 MEQ TOTAL) BY MOUTH DAILY  180 tablet  3  . pantoprazole (PROTONIX) 40 MG tablet TAKE 1 TABLET BY MOUTH EVERY DAY  30 tablet  5  . phenazopyridine (AZO-TABS) 95 MG tablet Take 95 mg by mouth daily.        [name] SYNTHROID 137 MCG tablet TAKE 1 BY MOUTH ONCE DAILY  30 tablet  5  . albuterol-ipratropium (COMBIVENT) 18-103 MCG/ACT inhaler Inhale 2 puffs into the lungs every 6 (six) hours as needed for wheezing.  1 Inhaler  0  . amLODipine (NORVASC) 2.5 MG tablet Take 1 tablet (2.5 mg total) by mouth daily.  30 tablet  3  . citalopram (CELEXA) 10 MG tablet as needed.       .  desloratadine (CLARINEX) 5 MG tablet Take 5 mg by mouth daily as needed.      . fluconazole (DIFLUCAN) 150 MG tablet TAKE AS DIRECTED  2 tablet  0  . isometheptene-acetaminophen-dichloralphenazone (MIDRIN) 65-325-100 MG capsule Take 1 capsule by mouth every 4 (four) hours as needed.  30 capsule  2  . zolpidem (AMBIEN) 10 MG tablet Take 1 tablet (10 mg total) by mouth at bedtime as needed.  30 tablet  3  . DISCONTD: citalopram (CELEXA) 10 MG tablet TAKE 1 TABLET BY MOUTH EVERY DAY  30 tablet  5  . DISCONTD: furosemide (LASIX) 20 MG tablet Take 1 tablet (20 mg total) by mouth daily as needed (for ankle swelling).  30 tablet  11    Review of Systems Per HPI    Objective:   Physical Exam  Nursing note and vitals reviewed. Constitutional: She appears well-developed and well-nourished. No distress.       obese  HENT:  Head: Normocephalic and atraumatic.  Mouth/Throat: Oropharynx is clear and moist. No oropharyngeal exudate.  Eyes: Conjunctivae normal and EOM are normal. Pupils are equal, round, and reactive to light.  Neck: Normal range of motion. Neck supple. No hepatojugular reflux and no JVD present. Carotid bruit is not present.  Cardiovascular: Normal rate, regular rhythm, normal heart sounds and intact distal pulses.   No murmur heard.      occ skipped beats  Pulmonary/Chest: Effort normal and breath sounds normal. No respiratory distress. She has no wheezes. She has no rales.  Musculoskeletal: She exhibits no edema (nonpitting edema).  Skin: Skin is warm and dry. No rash noted.  Psychiatric: She has a normal mood and affect.       Assessment & Plan:

## 2012-04-01 ENCOUNTER — Telehealth: Payer: Self-pay

## 2012-04-01 NOTE — Telephone Encounter (Signed)
[facility] Triage Call Report Triage Record Num: [id number] Operator: Wells Guiles Patient Name: Tamara Morales Call Date & Time: 03/30/2012 1:12:00PM Patient Phone: [phone] PCP: Gwendolyn Grant Patient Gender: Female PCP Fax : [phone] Patient DOB: 1931/01/27 Practice Name: [facility] Reason for Call: Caller: Shirlean Mylar from [facility]; PCP: Other; CB#: [phone]; Call regarding Stat Labs done today-03/30/12. Results as follows: Troponin I was <0.30 and showed No Indication of Myocardial Injury and BNP elevated at 309.1. Results called to Dr. Danise Mina. PCP Calls, No Triage. Protocol(s) Used: PCP Calls, No Triage (Adult) Recommended Outcome per Protocol: Call Provider within 24 Hours Override Outcome if Used in Protocol: Call Provider Immediately RN Reason for Override Outcome: Caller Requests Md Be Paged. Reason for Outcome: Lab calling with test results Care Advice: ~ 03/30/2012 1:26:51PM Page 1 of 1 [email or web address]

## 2012-04-03 ENCOUNTER — Telehealth: Payer: Self-pay

## 2012-04-03 NOTE — Telephone Encounter (Signed)
Pt called to report BP reading this week per Dr Danise Mina - Monday 132/84 98/72 109/74 159/99, Tuesday 136/81 150/91. Pt was started on Norvasc 2.5 mg QD in addition to HCTZ 25 mg and advised to take Lasix 20 mg for 3 days (last dose yesterday). Pt is requesting advisement, please review in Dr Katheren Puller absence, thanks.

## 2012-04-03 NOTE — Telephone Encounter (Signed)
Detailed message left for pt at home number

## 2012-04-03 NOTE — Telephone Encounter (Signed)
BP readings are variable but not dangerous. The medications are reasonable and low dose. She should continue on her present medications, keep a log of her blood pressure readings and follow-up with Dr. Basilio Cairo.

## 2012-04-17 ENCOUNTER — Encounter: Payer: Self-pay | Admitting: Internal Medicine

## 2012-04-17 ENCOUNTER — Ambulatory Visit (INDEPENDENT_AMBULATORY_CARE_PROVIDER_SITE_OTHER): Payer: Medicare Other | Admitting: Internal Medicine

## 2012-04-17 ENCOUNTER — Telehealth: Payer: Self-pay | Admitting: Internal Medicine

## 2012-04-17 ENCOUNTER — Other Ambulatory Visit (INDEPENDENT_AMBULATORY_CARE_PROVIDER_SITE_OTHER): Payer: Medicare Other

## 2012-04-17 VITALS — BP 140/80 | HR 80 | Temp 97.5°F | Resp 16 | Wt 277.0 lb

## 2012-04-17 DIAGNOSIS — E039 Hypothyroidism, unspecified: Secondary | ICD-10-CM

## 2012-04-17 DIAGNOSIS — R0789 Other chest pain: Secondary | ICD-10-CM

## 2012-04-17 DIAGNOSIS — I10 Essential (primary) hypertension: Secondary | ICD-10-CM | POA: Diagnosis not present

## 2012-04-17 DIAGNOSIS — G4733 Obstructive sleep apnea (adult) (pediatric): Secondary | ICD-10-CM

## 2012-04-17 DIAGNOSIS — N390 Urinary tract infection, site not specified: Secondary | ICD-10-CM | POA: Diagnosis not present

## 2012-04-17 DIAGNOSIS — F329 Major depressive disorder, single episode, unspecified: Secondary | ICD-10-CM

## 2012-04-17 LAB — URINALYSIS, ROUTINE W REFLEX MICROSCOPIC
Bilirubin Urine: NEGATIVE
Ketones, ur: NEGATIVE
Total Protein, Urine: NEGATIVE
pH: 6 (ref 5.0–8.0)

## 2012-04-17 LAB — BASIC METABOLIC PANEL
BUN: 14 mg/dL (ref 6–23)
Creatinine, Ser: 0.9 mg/dL (ref 0.4–1.2)
GFR: 61.72 mL/min (ref >60.00)
Glucose, Bld: 106 mg/dL — ABNORMAL HIGH (ref 70–99)
Potassium: 3.5 mEq/L (ref 3.5–5.1)

## 2012-04-17 MED ORDER — FUROSEMIDE 20 MG PO TABS: 20.0000 mg | ORAL_TABLET | Freq: Every day | ORAL | Status: DC | PRN

## 2012-04-17 MED ORDER — OLMESARTAN-AMLODIPINE-HCTZ 20-5-12.5 MG PO TABS: 1.0000 | ORAL_TABLET | Freq: Every day | ORAL | Status: DC

## 2012-04-17 MED ORDER — FLUCONAZOLE 150 MG PO TABS: 150.0000 mg | ORAL_TABLET | Freq: Once | ORAL | Status: DC

## 2012-04-17 MED ORDER — CEFUROXIME AXETIL 250 MG PO TABS: 250.0000 mg | ORAL_TABLET | Freq: Two times a day (BID) | ORAL | Status: DC

## 2012-04-17 NOTE — Telephone Encounter (Signed)
Tamara Morales, please, inform patient that all labs are normal except for

## 2012-04-17 NOTE — Assessment & Plan Note (Signed)
PVCs on EKG F/u w/DR Asa Lente

## 2012-04-17 NOTE — Assessment & Plan Note (Signed)
F/u w/Dr Asa Lente

## 2012-04-17 NOTE — Telephone Encounter (Signed)
Caller: [name]/Patient; Patient Name: Tamara Morales; PCP: Gwendolyn Grant (Adults only); Best Callback Phone Number: [phone]; Reason for call: Other Has had BP issues in the past.  Added Norvasc 2.5 mg on 10/14.  Was to have stress test for evaluation 2-3 weeks ago but BP was so high it could not be done.    Was feeling bad yesterday 10/29 with BP reading of 152/99 at 8am.  Took BP pills and BP went down to  128/81 by 10 am.  Today 10/30 at 7 am again feeling so  bad and BP reading 184/117.  After medication 161/98 at 8:30 am.  Still not feeling well.   Has had cough for 4-6 weeks but feels that minor.   Triaged in Hypertension Diagnosed or Suspected Guideline - Disposition:  See Provider Within 4 Hours due to  Systolic blood pressuer of more than $RemoveB'180mg'hUjvcoOa$ Hg or diastolic blood pressure of more than 120.  Unable to schedule with PMD until late afternoon, patient wanting to come now since she has a ride.  Appt 11:15 am today Dr Alain Marion.

## 2012-04-17 NOTE — Assessment & Plan Note (Signed)
TSH Continue with current prescription therapy as reflected on the Med list.

## 2012-04-17 NOTE — Assessment & Plan Note (Signed)
UA Ceftin

## 2012-04-17 NOTE — Assessment & Plan Note (Signed)
Continue with current prescription therapy as reflected on the Med list.  

## 2012-04-17 NOTE — Assessment & Plan Note (Addendum)
Start Tribenzor (d/c HCTZ and Norvasc)

## 2012-04-17 NOTE — Progress Notes (Signed)
Patient ID: Tamara Morales, female   DOB: 06-03-1931, 78 y.o.   MRN: [id number]  Subjective:    Patient ID: Tamara Morales, female    DOB: [birth year], 78 y.o.   MRN: [id number]  Hypertension     C/o elevated BP readings (156-184/107).  Reviewed prior notes.  C/o feeling very tired as well - hasn't been sleeping well.  Hasn't been feeling well for last few days.  Having dry cough, some chest tightness with this.  Attributes both of these things to seasonal allergies.  Now feeling substernal chest tightness associated with mild dyspnea, but not relieved by rest.  Unsure if exertional as does not regularly exercise.  Comes and goes intermittently.  Last happened this morning with high blood pressure readings, none currently.  No nausea/vomiting or diaphoresis associated with this.  Appetite down recently.  + increased dyspnea on exertion recently.  H/o OSA, on nightly CPAP.    Wt Readings from Last 3 Encounters:  03/30/12 277 lb (125.646 kg)  03/13/12 279 lb 8 oz (126.78 kg)  11/22/11 281 lb 9.6 oz (127.733 kg)   BP Readings from Last 3 Encounters:  03/30/12 140/80  03/13/12 124/82  11/22/11 114/76      Denies fevers/chills, chest pain, HA, dizziness, nausea/vomiting, abd pain, diarrhea, constipation, dysuria.  No leg swelling recently.  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  Does tend to get seasonal allergy flares.  No recent prolonged immobility.  No current hormonal medicines.  No personal or family hx blood clots.  Non smoker. No personal or family history of CAD. Pt states she had normal heart catheterization about 3 years ago.  As far as HTN - reports compliance with med - only on HCTZ $Remov'25mg'EXmVck$  daily.  Rarely takes lasix $RemoveBef'20mg'BmZkkbHFus$ . Lab Results  Component Value Date   CHOL 175 03/13/2012   HDL 37.60* 03/13/2012   LDLCALC 112* 08/23/2010   LDLDIRECT 99.4 03/13/2012   TRIG 217.0* 03/13/2012   CHOLHDL 5 03/13/2012   Lab Results  Component Value Date   CREATININE 1.1 03/13/2012        Past Medical History  Diagnosis Date  . Morbid obesity   . MIGRAINE HEADACHE   . ARTHRITIS, KNEES, BILATERAL   . DEPRESSION   . GERD   . HYPERLIPIDEMIA   . HYPERTENSION   . Breast CA 2000    s/p R lumpectomy and XRT  . DJD (degenerative joint disease) of knee   . Urge incontinence   . HYPOTHYROIDISM     postsurgical  . OSA (obstructive sleep apnea) 01/17/2011 dx    Past Surgical History  Procedure Date  . Cholecystectomy 1995  . Breast lumpectomy 2000  . Thyroidectomy   . Breast biopsy 2000  . Replacement total knee bilateral 1999, 2005  . Tubal ligation   . Parathyroidectomy     2-3 removed  . Tonsillectomy   . Adenoidectomy   . Right leg femur repaired    Family History  Problem Relation Age of Onset  . Emphysema Sister   . Coronary artery disease Neg Hx    Current Outpatient Prescriptions on File Prior to Visit  Medication Sig Dispense Refill  . acetaminophen (TYLENOL) 500 MG tablet Take 500 mg by mouth as needed.      [name] albuterol-ipratropium (COMBIVENT) 18-103 MCG/ACT inhaler Inhale 2 puffs into the lungs every 6 (six) hours as needed for wheezing.  1 Inhaler  0  . amLODipine (NORVASC) 2.5 MG tablet Take 1 tablet (2.5 mg total) by mouth daily.  30 tablet  3  . aspirin 81 MG tablet Take 81 mg by mouth daily.        . citalopram (CELEXA) 10 MG tablet as needed.       . desloratadine (CLARINEX) 5 MG tablet Take 5 mg by mouth daily as needed.      . fluconazole (DIFLUCAN) 150 MG tablet TAKE AS DIRECTED  2 tablet  0  . furosemide (LASIX) 20 MG tablet Take 20 mg by mouth daily as needed.      . hydrochlorothiazide (HYDRODIURIL) 25 MG tablet TAKE 1 TABLET EVERY DAY FOR BLOOD PRESSURE AND SWELLING  100 tablet  1  . isometheptene-acetaminophen-dichloralphenazone (MIDRIN) 65-325-100 MG capsule Take 1 capsule by mouth every 4 (four) hours as needed.  30 capsule  2  . KLOR-CON 10 10 MEQ tablet TAKE 2 TABLETS (20 MEQ TOTAL) BY MOUTH DAILY  180 tablet  3  .  pantoprazole (PROTONIX) 40 MG tablet TAKE 1 TABLET BY MOUTH EVERY DAY  30 tablet  5  . phenazopyridine (AZO-TABS) 95 MG tablet Take 95 mg by mouth daily.        [name] SYNTHROID 137 MCG tablet TAKE 1 BY MOUTH ONCE DAILY  30 tablet  5  . zolpidem (AMBIEN) 10 MG tablet Take 1 tablet (10 mg total) by mouth at bedtime as needed.  30 tablet  3    Review of Systems Per HPI    Objective:   Physical Exam  Nursing note and vitals reviewed. Constitutional: She appears well-developed and well-nourished. No distress.       obese  HENT:  Head: Normocephalic and atraumatic.  Mouth/Throat: Oropharynx is clear and moist. No oropharyngeal exudate.  Eyes: Conjunctivae normal and EOM are normal. Pupils are equal, round, and reactive to light.  Neck: Normal range of motion. Neck supple. No hepatojugular reflux and no JVD present. Carotid bruit is not present.  Cardiovascular: Normal rate, regular rhythm, normal heart sounds and intact distal pulses.   No murmur heard.      occ skipped beats  Pulmonary/Chest: Effort normal and breath sounds normal. No respiratory distress. She has no wheezes. She has no rales.  Musculoskeletal: She exhibits no edema (nonpitting edema).  Skin: Skin is warm and dry. No rash noted.  Psychiatric: She has a normal mood and affect.       Assessment & Plan:

## 2012-04-17 NOTE — Assessment & Plan Note (Signed)
Cont CPAP

## 2012-04-17 NOTE — Assessment & Plan Note (Signed)
Start Tribenzor 1  Day

## 2012-04-18 ENCOUNTER — Encounter: Payer: Self-pay | Admitting: Internal Medicine

## 2012-04-19 ENCOUNTER — Encounter: Payer: Self-pay | Admitting: Internal Medicine

## 2012-04-23 DIAGNOSIS — D485 Neoplasm of uncertain behavior of skin: Secondary | ICD-10-CM | POA: Diagnosis not present

## 2012-04-24 ENCOUNTER — Other Ambulatory Visit: Payer: Self-pay | Admitting: Internal Medicine

## 2012-05-01 ENCOUNTER — Other Ambulatory Visit: Payer: Self-pay | Admitting: Internal Medicine

## 2012-05-13 ENCOUNTER — Encounter: Payer: Self-pay | Admitting: Internal Medicine

## 2012-05-13 ENCOUNTER — Ambulatory Visit (INDEPENDENT_AMBULATORY_CARE_PROVIDER_SITE_OTHER): Payer: Medicare Other | Admitting: Internal Medicine

## 2012-05-13 VITALS — BP 118/78 | HR 87 | Temp 97.0°F | Ht 64.5 in | Wt 279.8 lb

## 2012-05-13 DIAGNOSIS — R0789 Other chest pain: Secondary | ICD-10-CM

## 2012-05-13 DIAGNOSIS — F329 Major depressive disorder, single episode, unspecified: Secondary | ICD-10-CM

## 2012-05-13 DIAGNOSIS — I10 Essential (primary) hypertension: Secondary | ICD-10-CM | POA: Diagnosis not present

## 2012-05-13 DIAGNOSIS — K219 Gastro-esophageal reflux disease without esophagitis: Secondary | ICD-10-CM | POA: Diagnosis not present

## 2012-05-13 MED ORDER — PANTOPRAZOLE SODIUM 20 MG PO TBEC: 20.0000 mg | DELAYED_RELEASE_TABLET | Freq: Every day | ORAL | Status: DC

## 2012-05-13 MED ORDER — OSELTAMIVIR PHOSPHATE 75 MG PO CAPS: 75.0000 mg | ORAL_CAPSULE | Freq: Two times a day (BID) | ORAL | Status: AC

## 2012-05-13 NOTE — Progress Notes (Signed)
Subjective:    Patient ID: Tamara Morales, female    DOB: [birth year], 78 y.o.   MRN: [id number]  HPI  here for follow up - reviewed interval hx  Seen here 02/2012 for "gas pain"  subsquently scheduled for stress echo 03/29/12 - cancelled test due to uncontrolled hypertension  Seen 10/12 and 10/30 by my partners for same Feels improved blood pressure on new meds without recurrent gas/chest pain   Also reviewed chronic medical issues:  hypothyroid - related to surgical resection for goiter - no hx thyroid cancer - chronically feels fatigued and complains of skin and nail problems - chronic obesity with weight gain - no bowel changes  hypertension - diovan rx'd 04/2010 but caused nausea and weak feeling - changed to losartan 08/2010 with hctz as needed, titrated to max but still high in AM; then tribenzor rx'd 03/2012 - reports compliance with ongoing medical treatment and denies adverse side effects related to current therapy: no chest pain, edema or headache; no history of stroke, coronary artery disease (neg cath 03/2010 during hosp for chest pain) -   dyslipidemia - previously on lipitor but stopped 07/2009 (pt unclear as to why) - takes fish oil - denies muscle weakenss or GI upset -  GERD with reflux - reports compliance with ongoing medical treatment and no recent change in medication (on pantoprotazole). denies adverse side effects related to current therapy.    OA, B knees, B shoulders R>L - s/p B TKR - tries to keeps active walking but increasing pain in RUE/shoulder (side where Tamara Morales uses cane) - s/p PT tx at [facility] -   depression - working with counselor susan bond due to life stressors; also on SSRI citalopam since 12/12 but takes only "prn" - feels improved -   Past Medical History  Diagnosis Date  . Morbid obesity   . MIGRAINE HEADACHE   . ARTHRITIS, KNEES, BILATERAL   . DEPRESSION   . GERD   . HYPERLIPIDEMIA   . HYPERTENSION   . Breast CA 2000    s/p R  lumpectomy and XRT  . DJD (degenerative joint disease) of knee   . Urge incontinence   . HYPOTHYROIDISM     postsurgical  . OSA (obstructive sleep apnea) 01/17/2011 dx    Review of Systems  Respiratory: Negative for cough and shortness of breath.   Cardiovascular: Negative for chest pain, palpitations and leg swelling.  Gastrointestinal: Negative for nausea, vomiting and diarrhea.  Musculoskeletal: Positive for arthralgias. Negative for joint swelling.  Neurological: Negative for weakness and headaches.       Objective:   Physical Exam  BP 118/78  Pulse 87  Temp 97 F (36.1 C) (Oral)  Ht 5' 4.5" (1.638 m)  Wt 279 lb 12.8 oz (126.916 kg)  BMI 47.29 kg/m2  SpO2 94% Wt Readings from Last 3 Encounters:  05/13/12 279 lb 12.8 oz (126.916 kg)  04/18/12 277 lb (125.646 kg)  03/30/12 277 lb (125.646 kg)   Constitutional: Tamara Morales is obese; appears well-developed and well-nourished. No distress. Neck: Normal range of motion. Neck supple. No JVD present. No thyromegaly present.  Cardiovascular: Normal rate, regular rhythm and normal heart sounds.  No murmur heard. trace edema BLE. Pulmonary/Chest: Effort normal and breath sounds normal. No respiratory distress. Tamara Morales has no wheezes.  Abdomen:  obese, SNTND, +BS - no r/g Psychiatric: Tamara Morales has a normal mood and affect. Her behavior is normal. Judgment and thought content normal.   Lab Results  Component Value Date  WBC 7.0 03/13/2012   HGB 13.8 03/13/2012   HCT 41.8 03/13/2012   PLT 181.0 03/13/2012   CHOL 175 03/13/2012   TRIG 217.0* 03/13/2012   HDL 37.60* 03/13/2012   LDLDIRECT 99.4 03/13/2012   ALT 12 03/13/2012   AST 16 03/13/2012   NA 141 04/17/2012   K 3.5 04/17/2012   CL 103 04/17/2012   CREATININE 0.9 04/17/2012   BUN 14 04/17/2012   CO2 32 04/17/2012   TSH 2.52 04/17/2012   INR 1.02 03/20/2010   HGBA1C 5.9 06/29/2011        Assessment & Plan:  See problem list. Medications and labs reviewed today.  Atypical chest pain,  improved since onset 02/2012 Stress echo scheduled 03/29/12 cancelled due to uncontrolled blood pressure, which is now controlled - see below -Pt does not wish to reschedule at this time -  Serial ecg's without ischemia Pt will call if recurrent symptoms or problems  Time spent with pt today 25 minutes, greater than 50% time spent counseling patient on chest pain, hypertension and medication review. Also review of interval records

## 2012-05-13 NOTE — Assessment & Plan Note (Signed)
Well controlled on current SSRI The current medical regimen is effective;  continue present plan and medications.

## 2012-05-13 NOTE — Assessment & Plan Note (Signed)
Continue pantoprotazole, omeprazole ineffective previously Reports 40mg  unnecessary - on 20mg  until 09/2011 -  Ok to resume 20mg  daily dose now

## 2012-05-13 NOTE — Patient Instructions (Signed)
It was good to see you today. We have reviewed your prior records including labs and tests today Medications reviewed and updated -Your prescription(s) have been submitted to your pharmacy. Please take as directed and contact our office if you believe you are having problem(s) with the medication(s). Will not reschedule cardiac stress test at this time - call if recurrent symptoms or problems Please schedule followup in 6 months, call sooner if problems.

## 2012-05-13 NOTE — Assessment & Plan Note (Signed)
Started Tribenzor 04/17/12 (d/c'd individual rx for HCTZ and Norvasc) Improved -The current medical regimen is effective;  continue present plan and medications. BP Readings from Last 3 Encounters:  05/13/12 118/78  04/18/12 140/80  03/30/12 140/80

## 2012-05-21 ENCOUNTER — Other Ambulatory Visit: Payer: Self-pay | Admitting: Internal Medicine

## 2012-05-23 ENCOUNTER — Ambulatory Visit: Payer: Medicare Other | Admitting: Pulmonary Disease

## 2012-08-03 ENCOUNTER — Other Ambulatory Visit: Payer: Self-pay | Admitting: Internal Medicine

## 2012-08-05 NOTE — Telephone Encounter (Signed)
Faxed [facility] back to [facility].../lmb 

## 2012-08-09 ENCOUNTER — Telehealth: Payer: Self-pay | Admitting: Pulmonary Disease

## 2012-08-09 DIAGNOSIS — G4733 Obstructive sleep apnea (adult) (pediatric): Secondary | ICD-10-CM

## 2012-08-09 NOTE — Telephone Encounter (Signed)
Order given to [facility] to start with pt's cpap supplies Tamara Morales

## 2012-08-09 NOTE — Telephone Encounter (Signed)
I spoke with pt and she stated she somehow erased her settings on her CPAP machine. Her old DME [facility] is out of business and will need DME. She wants to use [facility]. I have sent order to [facility]. She also would like Libby to call her. Please advise thanks

## 2012-08-15 ENCOUNTER — Ambulatory Visit: Payer: Medicare Other | Admitting: Pulmonary Disease

## 2012-08-15 ENCOUNTER — Telehealth: Payer: Self-pay | Admitting: Pulmonary Disease

## 2012-08-15 NOTE — Telephone Encounter (Signed)
CPAP 05/11/12 to 08/08/12 >> used on 89 of 90 nights with average 4 hrs 38 min.  Average AHI 6.7 with CPAP 10 cm H2O.  Will have my nurse inform patient that CPAP report looked good.  No change to current set up.

## 2012-08-19 NOTE — Telephone Encounter (Signed)
I spoke with patient about results and she verbalized understanding and had no questions 

## 2012-09-10 ENCOUNTER — Encounter: Payer: Self-pay | Admitting: Pulmonary Disease

## 2012-09-10 ENCOUNTER — Ambulatory Visit (INDEPENDENT_AMBULATORY_CARE_PROVIDER_SITE_OTHER): Payer: Medicare Other | Admitting: Pulmonary Disease

## 2012-09-10 VITALS — BP 128/74 | HR 107 | Temp 96.1°F | Ht 66.0 in | Wt 291.6 lb

## 2012-09-10 DIAGNOSIS — K117 Disturbances of salivary secretion: Secondary | ICD-10-CM

## 2012-09-10 DIAGNOSIS — R682 Dry mouth, unspecified: Secondary | ICD-10-CM

## 2012-09-10 DIAGNOSIS — G4733 Obstructive sleep apnea (adult) (pediatric): Secondary | ICD-10-CM | POA: Diagnosis not present

## 2012-09-10 NOTE — Assessment & Plan Note (Signed)
Advised her to try using biotene mouth wash as needed.

## 2012-09-10 NOTE — Patient Instructions (Signed)
Will change CPAP to 11 cm H2O Try using biotene mouth wash as needed for mouth dryness Will call with report from CPAP machine Follow up in 6 months

## 2012-09-10 NOTE — Assessment & Plan Note (Signed)
She continues to have daytime fatigue.  She still has mild elevation in AHI from recent CPAP download.  Will increase her CPAP to 11 cm H2O and get download from her machine.  Explained she needs to use the device when she is napping also.  If her symptoms persist, then she may need to have repeat ONO with CPAP and 2 liters oxygen.

## 2012-09-10 NOTE — Progress Notes (Signed)
Chief Complaint  Patient presents with  . Follow-up    Pt states she is wearing CPAP everynight w/ 2 liters oxygen. denies any problems w/ mask/machine. feeling better but sitll feels like she needs a nap eveyr afternoon    History of Present Illness: Tamara Morales is a 79 y.o. female with severe OSA using CPAP and 2 liters oxygen.  She is doing better with CPAP.  She still gets sleepy and fatigued in the afternoon.  She will nap for about 1.5 hours about 3 days per week.  Her mask fits well, and she has full face mask.  She is getting mouth dryness.  TESTS: PSG 01/22/11 >> AHI 74.3, SpO2 low 76%. ONO with RA and CPAP 04/21/11 >> Test time 5 hr 12 min. Basal SpO2 87%, low SpO2 73%. Spent 161 min with SpO2 < 88%. CPAP 05/11/12 to 08/08/12 >> used on 89 of 90 nights with average 4 hrs 38 min.  Average AHI 6.7 with CPAP 10 cm H2O.  Tamara Morales  has a past medical history of Morbid obesity; MIGRAINE HEADACHE; ARTHRITIS, KNEES, BILATERAL; DEPRESSION; GERD; HYPERLIPIDEMIA; HYPERTENSION; Breast CA (2000); DJD (degenerative joint disease) of knee; Urge incontinence; HYPOTHYROIDISM; and OSA (obstructive sleep apnea) (01/17/2011 dx).  Tamara Morales  has past surgical history that includes Cholecystectomy (1995); Breast lumpectomy (2000); Thyroidectomy; Breast biopsy (2000); Replacement total knee bilateral (1999, 2005); Tubal ligation; Parathyroidectomy; Tonsillectomy; Adenoidectomy; and Right leg femur repaired.  Prior to Admission medications   Medication Sig Start Date End Date Taking? Authorizing Provider  acetaminophen (TYLENOL) 500 MG tablet Take 500 mg by mouth as needed.   Yes Historical Provider, MD  albuterol-ipratropium (COMBIVENT) 18-103 MCG/ACT inhaler Inhale 2 puffs into the lungs every 6 (six) hours as needed for wheezing. 07/30/11 09/10/12 Yes April K Palumbo-Rasch, MD  aspirin 81 MG tablet Take 81 mg by mouth daily.     Yes Historical Provider, MD  citalopram (CELEXA) 10  MG tablet  04/24/12  Yes Rowe Clack, MD  desloratadine (CLARINEX) 5 MG tablet Take 5 mg by mouth daily as needed. 07/30/11 09/10/12 Yes April K Palumbo-Rasch, MD  fluconazole (DIFLUCAN) 150 MG tablet Take 150 mg by mouth as needed. 04/17/12  Yes Evie Lacks Plotnikov, MD  furosemide (LASIX) 20 MG tablet Take 1 tablet (20 mg total) by mouth daily as needed (swelling). 04/17/12 04/17/14 Yes Cassandria Anger, MD  isometheptene-acetaminophen-dichloralphenazone (MIDRIN) 65-325-100 MG capsule TAKE ONE CAPSULE BY MOUTH EVERY 4 HOURS AS NEEDED 08/03/12  Yes Rowe Clack, MD  KLOR-CON 10 10 MEQ tablet TAKE 2 TABLETS (20 MEQ TOTAL) BY MOUTH DAILY 11/30/11  Yes Rowe Clack, MD  Olmesartan-Amlodipine-HCTZ (TRIBENZOR) 20-5-12.5 MG TABS Take 1 tablet by mouth daily. 04/17/12  Yes Evie Lacks Plotnikov, MD  pantoprazole (PROTONIX) 20 MG tablet Take 1 tablet (20 mg total) by mouth daily. 05/13/12  Yes Rowe Clack, MD  phenazopyridine (AZO-TABS) 95 MG tablet Take 95 mg by mouth daily.     Yes Historical Provider, MD  SYNTHROID 137 MCG tablet TAKE 1 BY MOUTH ONCE DAILY 05/21/12  Yes Rowe Clack, MD    Allergies  Allergen Reactions  . Ciprofloxacin     REACTION: edgy  . Codeine     REACTION: Nausea$RemoveBeforeDE' \\T'ESmjFUCRjblPycj$ \ vomitting  . Epinephrine     REACTION: rapid pulse, sweats  . Oxycodone Other (See Comments)    Patient felt like it altered her mental status "Crazy" . Hallucinations later as well.  [name] Paxil (  Paroxetine Hydrochloride) Other (See Comments)    Sever depression   . Propoxyphene Hcl     REACTION: Nausea$RemoveBeforeDE' \\T'kqHDMcUQrPtZoAL$ \ vomitting  . Meloxicam Diarrhea     Physical Exam:  General - No distress ENT - No sinus tenderness, no oral exudate, no LAN Cardiac - s1s2 regular, no murmur Chest - No wheeze/rales/dullness Back - No focal tenderness Abd - Soft, non-tender Ext - No edema Neuro - Normal strength Skin - No rashes Psych - normal mood, and behavior   Assessment/Plan:  Chesley Mires,  MD [facility] Pager:  [phone] 09/10/2012, 3:22 PM

## 2012-09-11 ENCOUNTER — Ambulatory Visit: Payer: Medicare Other | Admitting: Internal Medicine

## 2012-09-11 DIAGNOSIS — Z0289 Encounter for other administrative examinations: Secondary | ICD-10-CM

## 2012-09-23 ENCOUNTER — Other Ambulatory Visit: Payer: Self-pay | Admitting: Internal Medicine

## 2012-10-11 DIAGNOSIS — L259 Unspecified contact dermatitis, unspecified cause: Secondary | ICD-10-CM | POA: Diagnosis not present

## 2012-10-11 DIAGNOSIS — D485 Neoplasm of uncertain behavior of skin: Secondary | ICD-10-CM | POA: Diagnosis not present

## 2012-10-28 ENCOUNTER — Other Ambulatory Visit: Payer: Self-pay | Admitting: Internal Medicine

## 2012-10-29 DIAGNOSIS — H01009 Unspecified blepharitis unspecified eye, unspecified eyelid: Secondary | ICD-10-CM | POA: Diagnosis not present

## 2012-10-29 DIAGNOSIS — Z961 Presence of intraocular lens: Secondary | ICD-10-CM | POA: Diagnosis not present

## 2012-10-29 DIAGNOSIS — H04129 Dry eye syndrome of unspecified lacrimal gland: Secondary | ICD-10-CM | POA: Diagnosis not present

## 2012-10-29 NOTE — Telephone Encounter (Signed)
Faxed [facility] back to [facility].../lmb 

## 2012-11-04 ENCOUNTER — Other Ambulatory Visit: Payer: Self-pay | Admitting: Internal Medicine

## 2012-11-13 ENCOUNTER — Encounter: Payer: Self-pay | Admitting: Internal Medicine

## 2012-11-14 ENCOUNTER — Other Ambulatory Visit: Payer: Self-pay | Admitting: *Deleted

## 2012-11-14 MED ORDER — FLUCONAZOLE 150 MG PO TABS: 150.0000 mg | ORAL_TABLET | ORAL | Status: DC | PRN

## 2012-11-14 NOTE — Telephone Encounter (Signed)
Requesting refill on her diflucan...lmb

## 2012-11-29 ENCOUNTER — Observation Stay (HOSPITAL_COMMUNITY)
Admission: EM | Admit: 2012-11-29 | Discharge: 2012-12-01 | Disposition: A | Discharge status: Home / Self Care | Payer: Medicare Other | Attending: Family Medicine | Admitting: Family Medicine

## 2012-11-29 ENCOUNTER — Emergency Department (HOSPITAL_COMMUNITY): Payer: Medicare Other

## 2012-11-29 DIAGNOSIS — R079 Chest pain, unspecified: Principal | ICD-10-CM | POA: Insufficient documentation

## 2012-11-29 DIAGNOSIS — R110 Nausea: Secondary | ICD-10-CM | POA: Diagnosis not present

## 2012-11-29 DIAGNOSIS — R5383 Other fatigue: Secondary | ICD-10-CM | POA: Diagnosis not present

## 2012-11-29 DIAGNOSIS — R0602 Shortness of breath: Secondary | ICD-10-CM | POA: Insufficient documentation

## 2012-11-29 DIAGNOSIS — G4733 Obstructive sleep apnea (adult) (pediatric): Secondary | ICD-10-CM | POA: Diagnosis not present

## 2012-11-29 DIAGNOSIS — I517 Cardiomegaly: Secondary | ICD-10-CM | POA: Diagnosis not present

## 2012-11-29 DIAGNOSIS — I10 Essential (primary) hypertension: Secondary | ICD-10-CM | POA: Diagnosis not present

## 2012-11-29 DIAGNOSIS — E039 Hypothyroidism, unspecified: Secondary | ICD-10-CM | POA: Diagnosis present

## 2012-11-29 DIAGNOSIS — R5381 Other malaise: Secondary | ICD-10-CM | POA: Diagnosis not present

## 2012-11-29 DIAGNOSIS — E785 Hyperlipidemia, unspecified: Secondary | ICD-10-CM | POA: Diagnosis not present

## 2012-11-29 DIAGNOSIS — R0789 Other chest pain: Secondary | ICD-10-CM | POA: Diagnosis present

## 2012-11-29 DIAGNOSIS — R072 Precordial pain: Secondary | ICD-10-CM | POA: Diagnosis not present

## 2012-11-29 LAB — URINALYSIS, ROUTINE W REFLEX MICROSCOPIC
Bilirubin Urine: NEGATIVE
Glucose, UA: NEGATIVE mg/dL
Ketones, ur: NEGATIVE mg/dL
Leukocytes, UA: NEGATIVE
Nitrite: NEGATIVE
Specific Gravity, Urine: 1.009 (ref 1.005–1.030)
pH: 5.5 (ref 5.0–8.0)

## 2012-11-29 LAB — BASIC METABOLIC PANEL
BUN: 23 mg/dL (ref 6–23)
CO2: 29 mEq/L (ref 19–32)
Chloride: 104 mEq/L (ref 96–112)
Creatinine, Ser: 0.9 mg/dL (ref 0.50–1.10)
GFR calc Af Amer: 68 mL/min — ABNORMAL LOW (ref >90)
Potassium: 3.7 mEq/L (ref 3.5–5.1)

## 2012-11-29 LAB — CBC WITH DIFFERENTIAL/PLATELET
Basophils Relative: 1 % (ref 0–1)
HCT: 38.9 % (ref 36.0–46.0)
Hemoglobin: 13.3 g/dL (ref 12.0–15.0)
Lymphocytes Relative: 18 % (ref 12–46)
Monocytes Absolute: 0.6 10*3/uL (ref 0.1–1.0)
Monocytes Relative: 9 % (ref 3–12)
Neutro Abs: 5.1 10*3/uL (ref 1.7–7.7)
Neutrophils Relative %: 70 % (ref 43–77)
RBC: 4.3 MIL/uL (ref 3.87–5.11)
WBC: 7.2 10*3/uL (ref 4.0–10.5)

## 2012-11-29 LAB — POCT I-STAT TROPONIN I

## 2012-11-29 MED ORDER — NITROGLYCERIN 2 % TD OINT: 0.5000 [in_us] | TOPICAL_OINTMENT | Freq: Four times a day (QID) | TRANSDERMAL | Status: DC

## 2012-11-29 MED ORDER — OMEGA-3-ACID ETHYL ESTERS 1 G PO CAPS
1.0000 g | ORAL_CAPSULE | Freq: Every day | ORAL | Status: DC
  Administered 2012-11-30 – 2012-12-01 (×2): 1 g via ORAL
  Filled 2012-11-29 (×3): qty 1

## 2012-11-29 MED ORDER — PANTOPRAZOLE SODIUM 20 MG PO TBEC
20.0000 mg | DELAYED_RELEASE_TABLET | Freq: Every day | ORAL | Status: DC
  Administered 2012-11-30 – 2012-12-01 (×2): 20 mg via ORAL
  Filled 2012-11-29 (×2): qty 1

## 2012-11-29 MED ORDER — AMLODIPINE BESYLATE 5 MG PO TABS
5.0000 mg | ORAL_TABLET | Freq: Every day | ORAL | Status: DC
  Administered 2012-12-01: 5 mg via ORAL
  Filled 2012-11-29 (×2): qty 1

## 2012-11-29 MED ORDER — SODIUM CHLORIDE 0.9 % IJ SOLN
3.0000 mL | Freq: Two times a day (BID) | INTRAMUSCULAR | Status: DC
  Administered 2012-11-30: 3 mL via INTRAVENOUS

## 2012-11-29 MED ORDER — OLMESARTAN-AMLODIPINE-HCTZ 20-5-12.5 MG PO TABS: 1.0000 | ORAL_TABLET | Freq: Every day | ORAL | Status: DC

## 2012-11-29 MED ORDER — SODIUM CHLORIDE 0.9 % IV SOLN
INTRAVENOUS | Status: DC
Start: 2012-11-29 — End: 2012-12-01
  Administered 2012-11-30 (×2): via INTRAVENOUS

## 2012-11-29 MED ORDER — LEVOTHYROXINE SODIUM 137 MCG PO TABS
137.0000 ug | ORAL_TABLET | Freq: Every day | ORAL | Status: DC
  Administered 2012-11-30 – 2012-12-01 (×2): 137 ug via ORAL
  Filled 2012-11-29 (×3): qty 1

## 2012-11-29 MED ORDER — OXYCODONE HCL 5 MG PO TABS: 5.0000 mg | ORAL_TABLET | ORAL | Status: DC | PRN

## 2012-11-29 MED ORDER — IRBESARTAN 150 MG PO TABS
150.0000 mg | ORAL_TABLET | Freq: Every day | ORAL | Status: DC
  Administered 2012-11-30 – 2012-12-01 (×2): 150 mg via ORAL
  Filled 2012-11-29 (×2): qty 1

## 2012-11-29 MED ORDER — POTASSIUM CHLORIDE CRYS ER 20 MEQ PO TBCR
20.0000 meq | EXTENDED_RELEASE_TABLET | Freq: Every day | ORAL | Status: DC
  Administered 2012-11-30 – 2012-12-01 (×2): 20 meq via ORAL
  Filled 2012-11-29 (×2): qty 1

## 2012-11-29 MED ORDER — ACETAMINOPHEN 325 MG PO TABS
650.0000 mg | ORAL_TABLET | Freq: Four times a day (QID) | ORAL | Status: DC | PRN
  Administered 2012-11-30 (×2): 650 mg via ORAL
  Filled 2012-11-29 (×2): qty 2

## 2012-11-29 MED ORDER — ACETAMINOPHEN 650 MG RE SUPP: 650.0000 mg | Freq: Four times a day (QID) | RECTAL | Status: DC | PRN

## 2012-11-29 MED ORDER — ZOLPIDEM TARTRATE 5 MG PO TABS
5.0000 mg | ORAL_TABLET | Freq: Every evening | ORAL | Status: DC | PRN
  Administered 2012-11-30 (×2): 5 mg via ORAL
  Filled 2012-11-29 (×2): qty 1

## 2012-11-29 MED ORDER — HYDROCHLOROTHIAZIDE 12.5 MG PO CAPS
12.5000 mg | ORAL_CAPSULE | Freq: Every day | ORAL | Status: DC
  Administered 2012-11-30 – 2012-12-01 (×2): 12.5 mg via ORAL
  Filled 2012-11-29 (×2): qty 1

## 2012-11-29 MED ORDER — ALUM & MAG HYDROXIDE-SIMETH 200-200-20 MG/5ML PO SUSP
30.0000 mL | Freq: Four times a day (QID) | ORAL | Status: DC | PRN
Start: 2012-11-29 — End: 2012-12-01

## 2012-11-29 MED ORDER — HYDROMORPHONE HCL PF 1 MG/ML IJ SOLN: 0.5000 mg | INTRAMUSCULAR | Status: DC | PRN

## 2012-11-29 MED ORDER — FUROSEMIDE 20 MG PO TABS
20.0000 mg | ORAL_TABLET | Freq: Every day | ORAL | Status: DC | PRN
Start: 2012-11-29 — End: 2012-12-01
  Filled 2012-11-29: qty 1

## 2012-11-29 MED ORDER — ONDANSETRON HCL 4 MG PO TABS: 4.0000 mg | ORAL_TABLET | Freq: Four times a day (QID) | ORAL | Status: DC | PRN

## 2012-11-29 MED ORDER — ENOXAPARIN SODIUM 40 MG/0.4ML ~~LOC~~ SOLN
40.0000 mg | SUBCUTANEOUS | Status: DC
  Administered 2012-11-30 – 2012-12-01 (×2): 40 mg via SUBCUTANEOUS
  Filled 2012-11-29 (×2): qty 0.4

## 2012-11-29 MED ORDER — ONDANSETRON HCL 4 MG/2ML IJ SOLN: 4.0000 mg | Freq: Four times a day (QID) | INTRAMUSCULAR | Status: DC | PRN

## 2012-11-29 NOTE — ED Provider Notes (Signed)
History     CSN: [id number]  Arrival date & time 11/29/12  2013   First MD Initiated Contact with Patient 11/29/12 2016      Chief Complaint  Patient presents with  . Chest Pain    (Consider location/radiation/quality/duration/timing/severity/associated sxs/prior treatment) Patient is a 79 y.o. female presenting with chest pain. The history is provided by the patient.  Chest Pain Pain location:  Substernal area Pain quality: tightness   Pain radiates to:  Does not radiate Pain radiates to the back: no   Pain severity:  Moderate Onset quality:  Sudden Duration:  2 hours Timing:  Constant Progression:  Resolved Chronicity:  New Context: at rest   Relieved by:  Nitroglycerin (complete resolution with nitro en route) Worsened by:  Nothing tried Ineffective treatments:  None tried Associated symptoms: nausea   Associated symptoms: no abdominal pain, no diaphoresis, no dizziness, no fever, no numbness, no shortness of breath, not vomiting and no weakness     Past Medical History  Diagnosis Date  . Morbid obesity   . MIGRAINE HEADACHE   . ARTHRITIS, KNEES, BILATERAL   . DEPRESSION   . GERD   . HYPERLIPIDEMIA   . HYPERTENSION   . Breast CA 2000    s/p R lumpectomy and XRT  . DJD (degenerative joint disease) of knee   . Urge incontinence   . HYPOTHYROIDISM     postsurgical  . OSA (obstructive sleep apnea) 01/17/2011 dx    Past Surgical History  Procedure Laterality Date  . Cholecystectomy  1995  . Breast lumpectomy  2000  . Thyroidectomy    . Breast biopsy  2000  . Replacement total knee bilateral  1999, 2005  . Tubal ligation    . Parathyroidectomy      2-3 removed  . Tonsillectomy    . Adenoidectomy    . Right leg femur repaired      Family History  Problem Relation Age of Onset  . Emphysema Sister   . Coronary artery disease Neg Hx     History  Substance Use Topics  . Smoking status: Former Smoker -- 2 years    Types: Cigarettes    Quit date:  06/19/1974  . Smokeless tobacco: Never Used     Comment: Quit in late 20's  . Alcohol Use: No     Comment: rare    OB History   Grav Para Term Preterm Abortions TAB SAB Ect Mult Living                  Review of Systems  Constitutional: Negative for fever, chills and diaphoresis.  HENT: Negative for congestion and rhinorrhea.   Respiratory: Negative for chest tightness and shortness of breath.   Cardiovascular: Positive for chest pain.  Gastrointestinal: Positive for nausea. Negative for vomiting, abdominal pain, diarrhea, constipation and rectal pain.  Genitourinary: Negative for dysuria.  Neurological: Negative for dizziness, weakness and numbness.  All other systems reviewed and are negative.    Allergies  Ciprofloxacin; Codeine; Epinephrine; Oxycodone; Paxil; Propoxyphene hcl; and Meloxicam  Home Medications   Current Outpatient Rx  Name  Route  Sig  Dispense  Refill  . acetaminophen (TYLENOL) 500 MG tablet   Oral   Take 500 mg by mouth as needed.         [name] EXPIRED: albuterol-ipratropium (COMBIVENT) 18-103 MCG/ACT inhaler   Inhalation   Inhale 2 puffs into the lungs every 6 (six) hours as needed for wheezing.   1 Inhaler  0   . aspirin 81 MG tablet   Oral   Take 81 mg by mouth daily.           . citalopram (CELEXA) 10 MG tablet               . EXPIRED: desloratadine (CLARINEX) 5 MG tablet   Oral   Take 5 mg by mouth daily as needed.         . fluconazole (DIFLUCAN) 150 MG tablet   Oral   Take 1 tablet (150 mg total) by mouth as needed.   2 tablet   0   . furosemide (LASIX) 20 MG tablet      TAKE 1 TABLET BY MOUTH EVERY DAY AS NEEDED FOR ANKLE SWELLING   30 tablet   2   . isometheptene-acetaminophen-dichloralphenazone (MIDRIN) 65-325-100 MG capsule      TAKE ONE CAPSULE BY MOUTH EVERY 4 HOURS AS NEEDED   30 capsule   0   . KLOR-CON 10 10 MEQ tablet      TAKE 2 TABLETS (20 MEQ TOTAL) BY MOUTH DAILY   180 tablet   1   .  Olmesartan-Amlodipine-HCTZ (TRIBENZOR) 20-5-12.5 MG TABS   Oral   Take 1 tablet by mouth daily.   30 tablet   11   . pantoprazole (PROTONIX) 20 MG tablet   Oral   Take 1 tablet (20 mg total) by mouth daily.   30 tablet   5   . phenazopyridine (AZO-TABS) 95 MG tablet   Oral   Take 95 mg by mouth daily.           [name] SYNTHROID 137 MCG tablet      TAKE 1 BY MOUTH ONCE DAILY   30 tablet   5   . zolpidem (AMBIEN) 10 MG tablet      TAKE 1 TABLET BY MOUTH AT BEDTIME   30 tablet   3     BP 151/130  Pulse 57  Resp 18  SpO2 94%  Physical Exam  Nursing note and vitals reviewed. Constitutional: She is oriented to person, place, and time. She appears well-developed and well-nourished. No distress.  HENT:  Head: Normocephalic and atraumatic.  Mouth/Throat: Oropharynx is clear and moist.  Eyes: EOM are normal. Pupils are equal, round, and reactive to light.  Neck: Normal range of motion. Neck supple.  Cardiovascular: Normal rate, regular rhythm and normal heart sounds.  Exam reveals no friction rub.   No murmur heard. Pulmonary/Chest: Effort normal and breath sounds normal. No respiratory distress. She has no wheezes. She has no rales.  Abdominal: Soft. There is no tenderness. There is no rebound and no guarding.  Obese.   Musculoskeletal: Normal range of motion. She exhibits edema (2+ pitting edema BLEs. ). She exhibits no tenderness.  Lymphadenopathy:    She has no cervical adenopathy.  Neurological: She is alert and oriented to person, place, and time.  Skin: Skin is warm and dry. No rash noted.  Psychiatric: She has a normal mood and affect. Her behavior is normal.    ED Course  Procedures (including critical care time)  Labs Reviewed  BASIC METABOLIC PANEL - Abnormal; Notable for the following:    Glucose, Bld 118 (*)    GFR calc non Af Amer 59 (*)    GFR calc Af Amer 68 (*)    All other components within normal limits  CBC WITH DIFFERENTIAL  URINALYSIS,  ROUTINE W REFLEX MICROSCOPIC  POCT I-STAT TROPONIN I  Dg Chest 2 View  11/29/2012   *RADIOLOGY REPORT*  Clinical Data: Chest pain  CHEST - 2 VIEW  Comparison: 07/30/2011  Findings: Cardiomegaly again noted.  Stable postsurgical changes right axilla and right breast.  No pulmonary edema.  Chronic mild interstitial prominence without convincing pulmonary edema.  No segmental infiltrate.  IMPRESSION: Stable postsurgical changes right axilla and right breast.  No pulmonary edema.  Chronic mild interstitial prominence without convincing pulmonary edema.  No segmental infiltrate.   Original Report Authenticated By: Lahoma Crocker, M.D.    Date: 11/29/2012  Rate: 98  Rhythm: sinus tachycardia  QRS Axis: normal  Intervals: normal  ST/T Wave abnormalities: normal  Conduction Disutrbances:none  Narrative Interpretation: occasional PVCs  Old EKG Reviewed: unchanged    1. Chest pain       MDM  8:29 PM is an 79 year old female with a history of morbid obesity, hyperlipidemia, hypertension and OSA presenting from home with an hour and a half of substernal chest tightness. She is unsure for shortness of breath but endorses nausea. She denies lightheadedness or dizziness. She denies history of similar symptoms in the past. She states that she felt like she had some weakness in both legs. She took 4 baby aspirin and received one sublingual nitroglycerin by EMS with complete resolution of pain. She's never had any provocative testing for her heart. Will check labs troponin, EKG and chest x-ray.  11:03 PM labs with no significant abnormality. Tn neg. cxr neg. Pt pain free after nitro en route. Given age with risk factors, chest pain relieved with nitro and no h/o provocative testing in past medicine consulted and pt admitted for observation.       Bonnetta Barry, MD 11/30/12 [phone]

## 2012-11-29 NOTE — ED Notes (Addendum)
1800: cp and nausea; feeling weak since waking up. Cp across chest and describes as pressure. Pt. Took 324 mg ASA and 0.4 mg sl; ntg with complete relief. EMS gave zofran piv and is working. ra sao2 87; [place] 47 97% sao2; wears o2 at night. St with pvc/pac's. Pt. Lives at [place] with husband.

## 2012-11-30 ENCOUNTER — Encounter (HOSPITAL_COMMUNITY): Payer: Self-pay | Admitting: Internal Medicine

## 2012-11-30 ENCOUNTER — Observation Stay (HOSPITAL_COMMUNITY): Payer: Medicare Other

## 2012-11-30 DIAGNOSIS — E039 Hypothyroidism, unspecified: Secondary | ICD-10-CM

## 2012-11-30 DIAGNOSIS — R0789 Other chest pain: Secondary | ICD-10-CM | POA: Diagnosis present

## 2012-11-30 DIAGNOSIS — R079 Chest pain, unspecified: Secondary | ICD-10-CM

## 2012-11-30 DIAGNOSIS — I10 Essential (primary) hypertension: Secondary | ICD-10-CM

## 2012-11-30 DIAGNOSIS — G4733 Obstructive sleep apnea (adult) (pediatric): Secondary | ICD-10-CM

## 2012-11-30 LAB — CBC
HCT: 38.8 % (ref 36.0–46.0)
MCH: 30.3 pg (ref 26.0–34.0)
MCV: 91.1 fL (ref 78.0–100.0)
RDW: 13.7 % (ref 11.5–15.5)
WBC: 5.7 10*3/uL (ref 4.0–10.5)

## 2012-11-30 LAB — TROPONIN I: Troponin I: <0.3 ng/mL (ref <0.30)

## 2012-11-30 LAB — BASIC METABOLIC PANEL
CO2: 28 mEq/L (ref 19–32)
Chloride: 105 mEq/L (ref 96–112)
Creatinine, Ser: 0.78 mg/dL (ref 0.50–1.10)
Glucose, Bld: 100 mg/dL — ABNORMAL HIGH (ref 70–99)

## 2012-11-30 MED ORDER — REGADENOSON 0.4 MG/5ML IV SOLN
0.4000 mg | Freq: Once | INTRAVENOUS | Status: AC
  Filled 2012-11-30: qty 5

## 2012-11-30 MED ORDER — REGADENOSON 0.4 MG/5ML IV SOLN
INTRAVENOUS | Status: AC
  Administered 2012-11-30: 0.4 mg via INTRAVENOUS
  Filled 2012-11-30: qty 5

## 2012-11-30 NOTE — Progress Notes (Signed)
8:07 AM I agree with HPI/GPe and A/P per Dr. Alferd Patee In addition   Ms. Tamara Morales is an 79 year old Caucasian female with history of obesity hypertension obstructive sleep apnea severe hyperlipidemia bilateral arthritis who presented 11/30/2012 with lower extremity pain while she was working in the garden and then after taking rest while going inside, chest pain. She quantifies the pain as a tightness across the chest with no radiation up her down the neck which was relieved only by nitroglycerin when seen and given this by ENT he she states that she has not had pain like this before and does not think it is similar to her reflux-type discomfort   She apparently has been having this on and off since October 2013 and was scheduled for stress test sometime then but this was subsequently canceled because of uncontrolled blood pressure and thought to be chest pain related to GERD. She had cardiac catheterization October 2011 revealing normal coronaries normal LV function, she had a CT abdomen that time also which was negative for pulmonary emboli and DVTs   Patient Active Problem List   Diagnosis Date Noted  . Chest pain 11/30/2012  . Dry mouth 09/10/2012  . UTI (lower urinary tract infection) 04/17/2012  . S/P mastectomy   . OSA (obstructive sleep apnea) 01/17/2011  . FATIGUE 02/08/2010  . MORBID OBESITY 10/04/2009  . DEPRESSION 10/04/2009  . MIGRAINE HEADACHE 10/04/2009  . ARTHRITIS, KNEES, BILATERAL 10/04/2009  . HYPOTHYROIDISM 08/05/2009  . HYPERLIPIDEMIA 08/05/2009  . HYPERTENSION 08/05/2009  . GERD 08/05/2009   A/p Discussed plan with cardiologist on call Dr. Aundra Dubin Heart score is ~4 Likely will need Stress test this admission

## 2012-11-30 NOTE — ED Provider Notes (Signed)
I saw and evaluated the patient, reviewed the resident's note and I agree with the findings and plan. Agree with EKG interpretation if present.   Pt with CP, relieved with NTG enroute. Has had same in the past, did not have stress test in September due to HTN then not rescheduled. Plan for admission to rule out MI then arrange for stress testing to be completed.   Charles B. Karle Starch, MD 11/30/12 [phone]

## 2012-11-30 NOTE — H&P (Signed)
[facility] History and Physical  Tamara Morales YBO:[id number] DOB: 02/28/1931 DOA: 11/29/2012  Referring physician: EDP PCP: Rene Paci, MD  Specialists:   Chief Complaint:  Chest Pain  HPI: Tamara Morales is a 79 y.o. female who presents to the ED with complaints with 5/10  SSCP chest area chest pain radiating across the chest that started at 7:30 pm associated with nausea and SOB.  She took 4 baby Aspirins before arriving to the ED, and the pain lasted 2 hours until she was given SL NTG.    In the ED she was evaluated and referred for medical admission.       Review of Systems: The patient denies anorexia, fever, chills, headaches, weight loss,, vision loss, diplopia, dizziness, decreased hearing, rhinitis, hoarseness, syncope, dyspnea on exertion, peripheral edema, balance deficits, cough, hemoptysis, abdominal pain, nausea, vomiting, diarrhea, constipation, hematemesis, melena, hematochezia, severe indigestion/heartburn, dysuria, hematuria, incontinence, muscle weakness, suspicious skin lesions, transient blindness, difficulty walking, depression, unusual weight change, abnormal bleeding, enlarged lymph nodes, angioedema, and breast masses.    Past Medical History  Diagnosis Date  . Morbid obesity   . MIGRAINE HEADACHE   . ARTHRITIS, KNEES, BILATERAL   . DEPRESSION   . GERD   . HYPERLIPIDEMIA   . HYPERTENSION   . Breast CA 2000    s/p R lumpectomy and XRT  . DJD (degenerative joint disease) of knee   . Urge incontinence   . HYPOTHYROIDISM     postsurgical  . OSA (obstructive sleep apnea) 01/17/2011 dx    Past Surgical History  Procedure Laterality Date  . Cholecystectomy  1995  . Breast lumpectomy  2000  . Thyroidectomy    . Breast biopsy  2000  . Replacement total knee bilateral  1999, 2005  . Tubal ligation    . Parathyroidectomy      2-3 removed  . Tonsillectomy    . Adenoidectomy    . Right leg femur repaired      Prior to Admission  medications   Medication Sig Start Date End Date Taking? Authorizing Provider  acetaminophen (TYLENOL) 500 MG tablet Take 500 mg by mouth every 6 (six) hours as needed for pain.    Yes Historical Provider, MD  aspirin 81 MG tablet Take 81 mg by mouth daily.     Yes Historical Provider, MD  FIBER PO Take 1 tablet by mouth daily.   Yes Historical Provider, MD  furosemide (LASIX) 20 MG tablet Take 20 mg by mouth daily as needed (for leg swelling).   Yes Historical Provider, MD  levothyroxine (SYNTHROID, LEVOTHROID) 137 MCG tablet Take 137 mcg by mouth daily before breakfast.   Yes Historical Provider, MD  Naproxen Sodium (ALEVE PO) Take 1 tablet by mouth 2 (two) times daily as needed (for pain).   Yes Historical Provider, MD  Olmesartan-Amlodipine-HCTZ (TRIBENZOR) 20-5-12.5 MG TABS Take 1 tablet by mouth daily. 04/17/12  Yes Georgina Quint Plotnikov, MD  Omega-3 Fatty Acids (FISH OIL PO) Take 1 capsule by mouth daily.   Yes Historical Provider, MD  pantoprazole (PROTONIX) 20 MG tablet Take 1 tablet (20 mg total) by mouth daily. 05/13/12  Yes Newt Lukes, MD  phenazopyridine (AZO-TABS) 95 MG tablet Take 95 mg by mouth daily as needed (for urinary pain).    Yes Historical Provider, MD  potassium chloride (K-DUR,KLOR-CON) 10 MEQ tablet Take 20 mEq by mouth daily.   Yes Historical Provider, MD  zolpidem (AMBIEN) 10 MG tablet Take 10 mg by mouth at  bedtime.   Yes Historical Provider, MD    Allergies  Allergen Reactions  . Ciprofloxacin     REACTION: edgy  . Codeine     REACTION: Nausea$RemoveBeforeDE' \\T'hhulBEHcGpphXkK$ \ vomitting  . Epinephrine     REACTION: rapid pulse, sweats  . Oxycodone Other (See Comments)    Patient felt like it altered her mental status "Crazy" . Hallucinations later as well.  [name] Paxil (Paroxetine Hydrochloride) Other (See Comments)    Sever depression   . Propoxyphene Hcl     REACTION: Nausea$RemoveBeforeDE' \\T'LzYvMcsciGvatNx$ \ vomitting  . Meloxicam Diarrhea    Social History:  reports that she quit smoking about 38 years ago.  Her smoking use included Cigarettes. She smoked 0.00 packs per day for 2 years. She has never used smokeless tobacco. She reports that she does not drink alcohol or use illicit drugs.     Family History  Problem Relation Age of Onset  . Emphysema Sister   . Coronary artery disease Neg Hx     (be sure to complete)   Physical Exam:  GEN:  Pleasant  79 y.o. female  examined  and in no acute distress; cooperative with exam Filed Vitals:   11/29/12 2036 11/29/12 2130 11/29/12 2200 11/30/12 0000  BP: 111/54 119/63 100/38 114/69  Pulse: 98 86 81 76  Temp: 97.7 F (36.5 C)     TempSrc: Oral     Resp: $Remo'23 14 21 13  'LOBnY$ SpO2: 94% 95% 91% 94%   Blood pressure 114/69, pulse 76, temperature 97.7 F (36.5 C), temperature source Oral, resp. rate 13, SpO2 94.00%. PSYCH: She is alert and oriented x4; does not appear anxious does not appear depressed; affect is normal HEENT: Normocephalic and Atraumatic, Mucous membranes pink; PERRLA; EOM intact; Fundi:  Benign;  No scleral icterus, Nares: Patent, Oropharynx: Clear,  Fair Dentition, Neck:  FROM, no cervical lymphadenopathy nor thyromegaly or carotid bruit; no JVD; Breasts:: Not examined CHEST WALL: No tenderness CHEST: Normal respiration, clear to auscultation bilaterally HEART: Regular rate and rhythm; no murmurs rubs or gallops BACK: No kyphosis or scoliosis; no CVA tenderness ABDOMEN: Positive Bowel Sounds, Scaphoid, Obese, soft non-tender; no masses, no organomegaly. Rectal Exam: Not done EXTREMITIES: No cyanosis, clubbing or edema; no ulcerations. Genitalia: not examined PULSES: 2+ and symmetric SKIN: Normal hydration no rash or ulceration CNS: Cranial nerves 2-12 grossly intact no focal neurologic deficit    Labs on Admission:  Basic Metabolic Panel:  Recent Labs Lab 11/29/12 2130  NA 141  K 3.7  CL 104  CO2 29  GLUCOSE 118*  BUN 23  CREATININE 0.90  CALCIUM 8.6   Liver Function Tests: No results found for this basename: AST,  ALT, ALKPHOS, BILITOT, PROT, ALBUMIN,  in the last 168 hours No results found for this basename: LIPASE, AMYLASE,  in the last 168 hours No results found for this basename: AMMONIA,  in the last 168 hours CBC:  Recent Labs Lab 11/29/12 2130  WBC 7.2  NEUTROABS 5.1  HGB 13.3  HCT 38.9  MCV 90.5  PLT 156   Cardiac Enzymes: No results found for this basename: CKTOTAL, CKMB, CKMBINDEX, TROPONINI,  in the last 168 hours  BNP (last 3 results) No results found for this basename: PROBNP,  in the last 8760 hours CBG: No results found for this basename: GLUCAP,  in the last 168 hours  Radiological Exams on Admission: Dg Chest 2 View  11/29/2012   *RADIOLOGY REPORT*  Clinical Data: Chest pain  CHEST - 2 VIEW  Comparison: 07/30/2011  Findings: Cardiomegaly again noted.  Stable postsurgical changes right axilla and right breast.  No pulmonary edema.  Chronic mild interstitial prominence without convincing pulmonary edema.  No segmental infiltrate.  IMPRESSION: Stable postsurgical changes right axilla and right breast.  No pulmonary edema.  Chronic mild interstitial prominence without convincing pulmonary edema.  No segmental infiltrate.   Original Report Authenticated By: Lahoma Crocker, M.D.     EKG: Independently reviewed.    Assessment/Plan Principal Problem:   Chest pain Active Problems:   HYPOTHYROIDISM   HYPERLIPIDEMIA   HYPERTENSION   OSA (obstructive sleep apnea)   1.   Chest Pain-   Cycle Troponins, telemetry monitoring, Nitropaste , O2, Rounding MD to decide if Stress test as Outpatient or Inpatient.    2.   HTN-   Continue   , Monitor BPs.     3.   Hyperlipidemia- continue Omega 3 Fatty Acids, check Fasting Lipids in AM.     4.  Hypothyroid-  Continue levothyroxine, and check TSH level.      5.  OSA- CPAP qhs.       6.   DVT prophylaxis with Lovenox.        Code Status:      FULL CODE Family Communication:    Daughter at  Disposition Plan:         Time spent: [facility] Pager [phone]  If 7PM-7AM, please contact night-coverage [email or web address] Password [email or web address] 11/30/2012, 12:32 AM

## 2012-12-01 ENCOUNTER — Encounter (HOSPITAL_COMMUNITY): Payer: Medicare Other

## 2012-12-01 DIAGNOSIS — R079 Chest pain, unspecified: Secondary | ICD-10-CM | POA: Diagnosis not present

## 2012-12-01 MED ORDER — TECHNETIUM TC 99M SESTAMIBI GENERIC - CARDIOLITE
30.0000 | Freq: Once | INTRAVENOUS | Status: AC | PRN
  Administered 2012-12-01: 30 via INTRAVENOUS

## 2012-12-01 NOTE — Discharge Summary (Signed)
Physician Discharge Summary  LOY LITTLE UYQ:[id number] DOB: 08/21/1930 DOA: 11/29/2012  PCP: Gwendolyn Grant, MD  Admit date: 11/29/2012 Discharge date: 12/01/2012  Time spent: 27 minutes  Recommendations for Outpatient Follow-up:  1. Consider MRI/x-ray of hip and spine-may need physical therapy as an outpatient 2. Recommend be met and CBC in about a week 3. Patient needs to lose weight-please refer to nutritionist/dietitian 4. Her chest pain has been proven numerous to be noncardiac-please address in terms of further workup if indicated   Discharge Diagnoses:  Principal Problem:   Chest pain Active Problems:   HYPOTHYROIDISM   HYPERLIPIDEMIA   HYPERTENSION   OSA (obstructive sleep apnea)   Discharge Condition: Fair  Diet recommendation: Heart healthy low-salt  Filed Weights   11/30/12 0054  Weight: 130.092 kg (286 lb 12.8 oz)    History of present illness:  This 79 year old Caucasian female with multiple comorbidities including hypertension OSA, edema, arthritis presented to [facility] ED 11/30/2012 with chest pain and lower extremity pain when she was outside in the garden ambulating. Because her heart score was 4 and was elevated, she was admitted and ruled out for MI with exercise and Myoview stress testing. Troponins were negative and there were no changes on EKG. It is noted that a catheterization October 2007 showed normal coronary arteries. She complained of some pain in her lower extremities which was primary muscle skeletal with internal/external rotation of the hip and point tenderness over area of trochanteric bursa. She also had some lower lumbar increased lordosis and muscular spasm. She'll be discharged to a friend's home assisted living and may benefit from PT OT followup  Discharge Exam: Filed Vitals:   11/30/12 2000 12/01/12 0009 12/01/12 0400 12/01/12 0741  BP: 96/54 123/61 116/59 120/60  Pulse: 75 82 87 77  Temp: 98.2 F (36.8 C) 97.8 F (36.6 C) 97.2 F  (36.2 C) 97.5 F (36.4 C)  TempSrc:    Oral  Resp: $Remo'20 20 20 18  'gqzdC$ Height:      Weight:      SpO2: 97% 98% 97% 95%   Doing well, no further chest pain no nausea no vomiting Complains of some lower extremity pain, No short of breath although is using oxygen currently  General: Morbidly obese Caucasian female no apparent distress Cardiovascular: S1-S2 no murmur rub or gallop telemetry negative Respiratory: Clear Internal/external rotation left hip somewhat antalgic. Increased lumbar lordosis  Discharge Instructions  Discharge Orders   Future Orders Complete By Expires     Diet - low sodium heart healthy  As directed     Discharge instructions  As directed     Comments:      Follow with Dr. Melida Gimenez for Back pain Please followup with  regular cardiologist in about a month The been no changes to her medications while in hospital however recommend that you get some imaging to her back and of the hips for your pains I would not recommend long-term Naprosyn use unless he really    Increase activity slowly  As directed         Medication List    TAKE these medications       acetaminophen 500 MG tablet  Commonly known as:  TYLENOL  Take 500 mg by mouth every 6 (six) hours as needed for pain.     ALEVE PO  Take 1 tablet by mouth 2 (two) times daily as needed (for pain).     aspirin 81 MG tablet  Take 81 mg by mouth  daily.     AZO-TABS 95 MG tablet  Generic drug:  phenazopyridine  Take 95 mg by mouth daily as needed (for urinary pain).     FIBER PO  Take 1 tablet by mouth daily.     FISH OIL PO  Take 1 capsule by mouth daily.     furosemide 20 MG tablet  Commonly known as:  LASIX  Take 20 mg by mouth daily as needed (for leg swelling).     levothyroxine 137 MCG tablet  Commonly known as:  SYNTHROID, LEVOTHROID  Take 137 mcg by mouth daily before breakfast.     Olmesartan-Amlodipine-HCTZ 20-5-12.5 MG Tabs  Commonly known as:  TRIBENZOR  Take 1 tablet by mouth  daily.     pantoprazole 20 MG tablet  Commonly known as:  PROTONIX  Take 1 tablet (20 mg total) by mouth daily.     potassium chloride 10 MEQ tablet  Commonly known as:  K-DUR,KLOR-CON  Take 20 mEq by mouth daily.     zolpidem 10 MG tablet  Commonly known as:  AMBIEN  Take 10 mg by mouth at bedtime.       Allergies  Allergen Reactions  . Ciprofloxacin     REACTION: edgy  . Codeine     REACTION: Nausea$RemoveBeforeDE' \\T'RFVhwONQBNODyal$ \ vomitting  . Epinephrine     REACTION: rapid pulse, sweats  . Oxycodone Other (See Comments)    Patient felt like it altered her mental status "Crazy" . Hallucinations later as well.  [name] Paxil (Paroxetine Hydrochloride) Other (See Comments)    Sever depression   . Propoxyphene Hcl     REACTION: Nausea$RemoveBeforeDE' \\T'zClwaZSgDXBPFmk$ \ vomitting  . Meloxicam Diarrhea      The results of significant diagnostics from this hospitalization (including imaging, microbiology, ancillary and laboratory) are listed below for reference.    Significant Diagnostic Studies: Dg Chest 2 View  11/29/2012   *RADIOLOGY REPORT*  Clinical Data: Chest pain  CHEST - 2 VIEW  Comparison: 07/30/2011  Findings: Cardiomegaly again noted.  Stable postsurgical changes right axilla and right breast.  No pulmonary edema.  Chronic mild interstitial prominence without convincing pulmonary edema.  No segmental infiltrate.  IMPRESSION: Stable postsurgical changes right axilla and right breast.  No pulmonary edema.  Chronic mild interstitial prominence without convincing pulmonary edema.  No segmental infiltrate.   Original Report Authenticated By: Lahoma Crocker, M.D.   Nm Myocar Multi W/spect W/wall Motion / Ef  12/01/2012   *RADIOLOGY REPORT*  Clinical Data:  Chest pain.  MYOCARDIAL IMAGING WITH SPECT (REST AND PHARMACOLOGIC-STRESS - 2 DAY PROTOCOL) GATED LEFT VENTRICULAR WALL MOTION STUDY LEFT VENTRICULAR EJECTION FRACTION  Technique:  Standard myocardial SPECT imaging was performed after intravenous injection of 30 mCi Tc-25m sestamibi at  rest.  On a different day, intravenous infusion of  regadenoson was performed under supervision of the Cardiology staff.  At peak effect of the drug, 30 mCi Tc-24m sestamibi was injected intravenously and standard myocardial SPECT imaging was performed.  Quantitative gated imaging was also performed to evaluate left ventricular wall motion and estimate left ventricular ejection fraction.  Comparison:  None.  Findings: The myocardial perfusion is essentially normal on the stress images.  There is a small fixed defect along the anteroseptal wall at the apex.  This is most likely related to breast attenuation.  There is no significant reversible defect. The left ventricle wall motion is normal.  The calculated ejection fraction is 63%.  End-diastolic volume is 92 ml and end-systolic volume is 34  ml.  IMPRESSION: No evidence for pharmacologically induced ischemia.  Normal wall motion and calculated ejection fraction is 63%.   Original Report Authenticated By: Markus Daft, M.D.    Microbiology: No results found for this or any previous visit (from the past 240 hour(s)).   Labs: Basic Metabolic Panel:  Recent Labs Lab 11/29/12 2130 11/30/12 0524  NA 141 141  K 3.7 3.7  CL 104 105  CO2 29 28  GLUCOSE 118* 100*  BUN 23 18  CREATININE 0.90 0.78  CALCIUM 8.6 8.2*   Liver Function Tests: No results found for this basename: AST, ALT, ALKPHOS, BILITOT, PROT, ALBUMIN,  in the last 168 hours No results found for this basename: LIPASE, AMYLASE,  in the last 168 hours No results found for this basename: AMMONIA,  in the last 168 hours CBC:  Recent Labs Lab 11/29/12 2130 11/30/12 0524  WBC 7.2 5.7  NEUTROABS 5.1  --   HGB 13.3 12.9  HCT 38.9 38.8  MCV 90.5 91.1  PLT 156 156   Cardiac Enzymes:  Recent Labs Lab 11/30/12 0020 11/30/12 0524 11/30/12 1300  TROPONINI <0.30 <0.30 <0.30   BNP: BNP (last 3 results) No results found for this basename: PROBNP,  in the last 8760 hours CBG: No  results found for this basename: GLUCAP,  in the last 168 hours     Signed:  Nita Sells  [facility] 12/01/2012, 1:57 PM

## 2012-12-01 NOTE — Progress Notes (Signed)
Discharge instructions reviewed with patient and daughter.  F/U appointments also reviewed.  Pt. Already signed up to "My Chart".  Pt. Discharged via private vehicle with daughter to independent living apartment, where she lives with her husband.  Escorted to exit via wheelchair by nurse tech.  All teaching reinforced with teach-back method.

## 2012-12-04 ENCOUNTER — Ambulatory Visit (INDEPENDENT_AMBULATORY_CARE_PROVIDER_SITE_OTHER): Payer: Medicare Other | Admitting: Internal Medicine

## 2012-12-04 ENCOUNTER — Encounter: Payer: Self-pay | Admitting: Internal Medicine

## 2012-12-04 ENCOUNTER — Ambulatory Visit (INDEPENDENT_AMBULATORY_CARE_PROVIDER_SITE_OTHER)
Admission: RE | Admit: 2012-12-04 | Discharge: 2012-12-04 | Disposition: A | Discharge status: Home / Self Care | Payer: Medicare Other | Source: Ambulatory Visit | Attending: Internal Medicine | Admitting: Internal Medicine

## 2012-12-04 VITALS — BP 118/86 | HR 81 | Temp 97.5°F | Ht 66.0 in | Wt 288.0 lb

## 2012-12-04 DIAGNOSIS — M25559 Pain in unspecified hip: Secondary | ICD-10-CM

## 2012-12-04 DIAGNOSIS — R209 Unspecified disturbances of skin sensation: Secondary | ICD-10-CM

## 2012-12-04 DIAGNOSIS — R200 Anesthesia of skin: Secondary | ICD-10-CM

## 2012-12-04 DIAGNOSIS — M545 Low back pain, unspecified: Secondary | ICD-10-CM | POA: Diagnosis not present

## 2012-12-04 DIAGNOSIS — M4120 Other idiopathic scoliosis, site unspecified: Secondary | ICD-10-CM | POA: Diagnosis not present

## 2012-12-04 DIAGNOSIS — M25552 Pain in left hip: Secondary | ICD-10-CM

## 2012-12-04 NOTE — Patient Instructions (Signed)
Back Exercises Back exercises help treat and prevent back injuries. The goal of back exercises is to increase the strength of your abdominal and back muscles and the flexibility of your back. These exercises should be started when you no longer have back pain. Back exercises include:  Pelvic Tilt. Lie on your back with your knees bent. Tilt your pelvis until the lower part of your back is against the floor. Hold this position 5 to 10 sec and repeat 5 to 10 times.  Knee to Chest. Pull first 1 knee up against your chest and hold for 20 to 30 seconds, repeat this with the other knee, and then both knees. This may be done with the other leg straight or bent, whichever feels better.  Sit-Ups or Curl-Ups. Bend your knees 90 degrees. Start with tilting your pelvis, and do a partial, slow sit-up, lifting your trunk only 30 to 45 degrees off the floor. Take at least 2 to 3 seconds for each sit-up. Do not do sit-ups with your knees out straight. If partial sit-ups are difficult, simply do the above but with only tightening your abdominal muscles and holding it as directed.  Hip-Lift. Lie on your back with your knees flexed 90 degrees. Push down with your feet and shoulders as you raise your hips a couple inches off the floor; hold for 10 seconds, repeat 5 to 10 times.  Back arches. Lie on your stomach, propping yourself up on bent elbows. Slowly press on your hands, causing an arch in your low back. Repeat 3 to 5 times. Any initial stiffness and discomfort should lessen with repetition over time.  Shoulder-Lifts. Lie face down with arms beside your body. Keep hips and torso pressed to floor as you slowly lift your head and shoulders off the floor. Do not overdo your exercises, especially in the beginning. Exercises may cause you some mild back discomfort which lasts for a few minutes; however, if the pain is more severe, or lasts for more than 15 minutes, do not continue exercises until you see your caregiver.  Improvement with exercise therapy for back problems is slow.  See your caregivers for assistance with developing a proper back exercise program. Document Released: 07/13/2004 Document Revised: 08/28/2011 Document Reviewed: 04/06/2011 [employer] Patient Information 2014 [employer].  

## 2012-12-04 NOTE — Progress Notes (Signed)
Subjective:    Patient ID: Tamara Morales, female    DOB: 1930-11-14, 79 y.o.   MRN: [id number]  HPI  Pt presents to the clinic today for hospital f/u. She was admitted to the hospital for atypical chest pain and weakness. She was admitted to the hospital for MI r/o. Since leaving the hospital she still c/o pain in the left leg. She describes the pain as numbness and tingling. It radiates to the left lower back. She has taken Aleve which did not seem help. She has not had any Aleve since last Wednesday. This pain started roughly 3 weeks ago. She did not have any injury that she is aware of. She has no history of arthritis. She has not had this evaluated.  Review of Systems      Past Medical History  Diagnosis Date  . Morbid obesity   . MIGRAINE HEADACHE   . ARTHRITIS, KNEES, BILATERAL   . DEPRESSION   . GERD   . HYPERLIPIDEMIA   . HYPERTENSION   . Breast CA 2000    s/p R lumpectomy and XRT  . DJD (degenerative joint disease) of knee   . Urge incontinence   . HYPOTHYROIDISM     postsurgical  . OSA (obstructive sleep apnea) 01/17/2011 dx    Current Outpatient Prescriptions  Medication Sig Dispense Refill  . acetaminophen (TYLENOL) 500 MG tablet Take 500 mg by mouth every 6 (six) hours as needed for pain.       [name] aspirin 81 MG tablet Take 81 mg by mouth daily.        [name] FIBER PO Take 1 tablet by mouth daily.      . furosemide (LASIX) 20 MG tablet Take 20 mg by mouth daily as needed (for leg swelling).      [name] levothyroxine (SYNTHROID, LEVOTHROID) 137 MCG tablet Take 137 mcg by mouth daily before breakfast.      . Naproxen Sodium (ALEVE PO) Take 1 tablet by mouth 2 (two) times daily as needed (for pain).      . Olmesartan-Amlodipine-HCTZ (TRIBENZOR) 20-5-12.5 MG TABS Take 1 tablet by mouth daily.  30 tablet  11  . Omega-3 Fatty Acids (FISH OIL PO) Take 1 capsule by mouth daily.      . pantoprazole (PROTONIX) 20 MG tablet Take 1 tablet (20 mg total) by mouth daily.  30 tablet  5   . potassium chloride (K-DUR,KLOR-CON) 10 MEQ tablet Take 20 mEq by mouth daily.      [name] zolpidem (AMBIEN) 10 MG tablet Take 10 mg by mouth at bedtime.      . phenazopyridine (AZO-TABS) 95 MG tablet Take 95 mg by mouth daily as needed (for urinary pain).        No current facility-administered medications for this visit.    Allergies  Allergen Reactions  . Ciprofloxacin     REACTION: edgy  . Codeine     REACTION: Nausea \\T \ vomitting  . Epinephrine     REACTION: rapid pulse, sweats  . Oxycodone Other (See Comments)    Patient felt like it altered her mental status "Crazy" . Hallucinations later as well.  [name] Paxil (Paroxetine Hydrochloride) Other (See Comments)    Sever depression   . Propoxyphene Hcl     REACTION: Nausea \\T \ vomitting  . Meloxicam Diarrhea    Family History  Problem Relation Age of Onset  . Emphysema Sister   . Coronary artery disease Neg Hx     History   Social  History  . Marital Status: Married    Spouse Name: N/A    Number of Children: N/A  . Years of Education: N/A   Occupational History  . RETIRED     worked on office   Social History Main Topics  . Smoking status: Former Smoker -- 2 years    Types: Cigarettes    Quit date: 06/19/1974  . Smokeless tobacco: Never Used     Comment: Quit in late 20's  . Alcohol Use: No     Comment: rare  . Drug Use: No  . Sexually Active: Not on file   Other Topics Concern  . Not on file   Social History Narrative  . No narrative on file     Constitutional: Denies fever, malaise, fatigue, headache or abrupt weight changes.  Musculoskeletal: Pt reports low back pain and left leg pain. Denies decrease in range of motion, difficulty with gait.  Skin: Denies redness, rashes, lesions or ulcercations.  Neurological: Pt reports numbness in left leg. Denies dizziness, difficulty with memory, difficulty with speech or problems with balance and coordination.   No other specific complaints in a complete review  of systems (except as listed in HPI above).  Objective:   Physical Exam   BP 118/86  Pulse 81  Temp(Src) 97.5 F (36.4 C) (Oral)  Ht $R'5\' 6"'HW$  (1.676 m)  Wt 288 lb (130.636 kg)  BMI 46.51 kg/m2  SpO2 93% Wt Readings from Last 3 Encounters:  12/04/12 288 lb (130.636 kg)  11/30/12 286 lb 12.8 oz (130.092 kg)  09/10/12 291 lb 9.6 oz (132.269 kg)    General: Appears her stated age, obese but well developed, well nourished in NAD. Skin: Warm, dry and intact. No rashes, lesions or ulcerations noted.  Cardiovascular: Normal rate and rhythm. S1,S2 noted.  No murmur, rubs or gallops noted. No JVD or BLE edema. No carotid bruits noted. Pulmonary/Chest: Normal effort and positive vesicular breath sounds. No respiratory distress. No wheezes, rales or ronchi noted.  Musculoskeletal: Normal range of motion. No signs of joint swelling. No difficulty with gait.  Neurological: Alert and oriented. Cranial nerves II-XII intact. Coordination normal. +DTRs bilaterally. Sensation intact.  BMET    Component Value Date/Time   NA 141 11/30/2012 0524   K 3.7 11/30/2012 0524   CL 105 11/30/2012 0524   CO2 28 11/30/2012 0524   GLUCOSE 100* 11/30/2012 0524   BUN 18 11/30/2012 0524   CREATININE 0.78 11/30/2012 0524   CALCIUM 8.2* 11/30/2012 0524   GFRNONAA 77* 11/30/2012 0524   GFRAA 89* 11/30/2012 0524    Lipid Panel     Component Value Date/Time   CHOL 175 03/13/2012 1133   TRIG 217.0* 03/13/2012 1133   HDL 37.60* 03/13/2012 1133   CHOLHDL 5 03/13/2012 1133   VLDL 43.4* 03/13/2012 1133   LDLCALC 112* 08/23/2010 1130    CBC    Component Value Date/Time   WBC 5.7 11/30/2012 0524   RBC 4.26 11/30/2012 0524   HGB 12.9 11/30/2012 0524   HCT 38.8 11/30/2012 0524   PLT 156 11/30/2012 0524   MCV 91.1 11/30/2012 0524   MCH 30.3 11/30/2012 0524   MCHC 33.2 11/30/2012 0524   RDW 13.7 11/30/2012 0524   LYMPHSABS 1.3 11/29/2012 2130   MONOABS 0.6 11/29/2012 2130   EOSABS 0.2 11/29/2012 2130   BASOSABS 0.0 11/29/2012 2130     Hgb A1C Lab Results  Component Value Date   HGBA1C 5.9 06/29/2011  Assessment & Plan:   Left hip pain, low back pain and left leg numbness:  Hospital notes, labs, imging and procedures reviewed, took approx 10 minutes Continue using your cane for support Will xray left hip and lumbar spine For now, take aleve as needed  Will f/u after xrays are back

## 2012-12-05 ENCOUNTER — Telehealth: Payer: Self-pay

## 2012-12-05 NOTE — Telephone Encounter (Signed)
Message in result notes and pt's chart closing phone note.

## 2012-12-05 NOTE — Telephone Encounter (Signed)
Patient returning you call

## 2012-12-05 NOTE — Telephone Encounter (Signed)
Phone call from patient requesting xray results

## 2012-12-17 ENCOUNTER — Other Ambulatory Visit: Payer: Self-pay | Admitting: Internal Medicine

## 2012-12-18 ENCOUNTER — Other Ambulatory Visit: Payer: Self-pay | Admitting: Internal Medicine

## 2013-01-12 ENCOUNTER — Telehealth: Payer: Self-pay | Admitting: Pulmonary Disease

## 2013-01-12 NOTE — Telephone Encounter (Signed)
CPAP 10/11/12 to 01/08/13 >> Used on 88 of 90 nights with average 5 hrs 15 min.  Average AHI 5.3 with CPAP 11 cm H2O.  Will have my nurse inform pt that CPAP report looks better since change of CPAP setting.  If she is sleeping better, than no additional changes needed.  If she is still having trouble, then she needs ONO with CPAP 11 cm H2O and 2 liters oxygen.

## 2013-01-13 NOTE — Telephone Encounter (Signed)
Pt is aware of download results. States that she is sleeping better and is getting used to the machine.

## 2013-02-04 ENCOUNTER — Other Ambulatory Visit: Payer: Self-pay | Admitting: Internal Medicine

## 2013-02-07 ENCOUNTER — Other Ambulatory Visit: Payer: Self-pay | Admitting: Internal Medicine

## 2013-02-27 ENCOUNTER — Telehealth: Payer: Self-pay | Admitting: Internal Medicine

## 2013-02-27 NOTE — Telephone Encounter (Signed)
Patient Information:  Caller Name: SHASHANA  Phone: [phone]  Patient: Tamara Morales, Tamara Morales  Gender: Female  DOB: [birth year]  Age: 79 Years  PCP: Gwendolyn Grant (Adults only)  Office Follow Up:  Does the office need to follow up with this patient?: No  Instructions For The Office: N/A  RN Note:  Onset of cold symptoms 02/23/13 with worsening cough over the course of the week since.  Afebrile.  States the cough is now disruptive to sleep.  Denies wheezing, difficulty breathing, or other emergent symptoms per cough protocol; advised appt within 24 hours due to continuous cough interfering with sleep.  Appt scheduled 0915 02/28/13 with Dr. Asa Lente; care advice given, with callback parameters.  krs/can  Symptoms  Reason For Call & Symptoms: cough, congestion  Reviewed Health History In EMR: Yes  Reviewed Medications In EMR: Yes  Reviewed Allergies In EMR: Yes  Reviewed Surgeries / Procedures: Yes  Date of Onset of Symptoms: 02/23/2013  Guideline(s) Used:  Cough  Disposition Per Guideline:   See Today or Tomorrow in Office  Reason For Disposition Reached:   Continuous (nonstop) coughing interferes with work or school and no improvement using cough treatment per Care Advice  Advice Given:  Reassurance  Coughing is the way that our lungs remove irritants and mucus. It helps protect our lungs from getting pneumonia.  You can get a dry hacking cough after a chest cold. Sometimes this type of cough can last 1-3 weeks, and be worse at night.  You can also get a cough after being exposed to irritating substances like smoke, strong perfumes, and dust.  Here is some care advice that should help.  Cough Medicines:  OTC Cough Syrups: The most common cough suppressant in OTC cough medications is dextromethorphan. Often the letters "DM" appear in the name.  OTC Cough Syrup - Dextromethorphan:  Cough syrups containing the cough suppressant dextromethorphan (DM) may help decrease your  cough. Cough syrups work best for coughs that keep you awake at night. They can also sometimes help in the late stages of a respiratory infection when the cough is dry and hacking. They can be used along with cough drops.  Examples: Benylin, Robitussin DM, Vicks 44 Cough Relief  Read the package instructions for dosage, contraindications, and other important information.  Caution - Dextromethorphan:   Do not try to completely suppress coughs that produce mucus and phlegm. Remember that coughing is helpful in bringing up mucus from the lungs and preventing pneumonia.  Coughing Spasms:  Drink warm fluids. Inhale warm mist (Reason: both relax the airway and loosen up the phlegm).  Suck on cough drops or hard candy to coat the irritated throat.  Prevent Dehydration:  Drink adequate liquids.  This will help soothe an irritated or dry throat and loosen up the phlegm.  Avoid Tobacco Smoke:  Smoking or being exposed to smoke makes coughs much worse.  Call Back If:  Difficulty breathing  Cough lasts more than 3 weeks  Fever lasts > 3 days  You become worse.  Patient Will Follow Care Advice:  YES  Appointment Scheduled:  02/28/2013 09:15:00 Appointment Scheduled Provider:  Gwendolyn Grant (Adults only)

## 2013-02-28 ENCOUNTER — Other Ambulatory Visit (INDEPENDENT_AMBULATORY_CARE_PROVIDER_SITE_OTHER): Payer: Medicare Other

## 2013-02-28 ENCOUNTER — Ambulatory Visit (INDEPENDENT_AMBULATORY_CARE_PROVIDER_SITE_OTHER): Payer: Medicare Other | Admitting: Internal Medicine

## 2013-02-28 ENCOUNTER — Ambulatory Visit (INDEPENDENT_AMBULATORY_CARE_PROVIDER_SITE_OTHER)
Admission: RE | Admit: 2013-02-28 | Discharge: 2013-02-28 | Disposition: A | Discharge status: Home / Self Care | Payer: Medicare Other | Source: Ambulatory Visit | Attending: Internal Medicine | Admitting: Internal Medicine

## 2013-02-28 ENCOUNTER — Encounter: Payer: Self-pay | Admitting: Internal Medicine

## 2013-02-28 VITALS — BP 120/72 | HR 96 | Temp 97.3°F | Wt 291.4 lb

## 2013-02-28 DIAGNOSIS — R05 Cough: Secondary | ICD-10-CM

## 2013-02-28 DIAGNOSIS — E039 Hypothyroidism, unspecified: Secondary | ICD-10-CM

## 2013-02-28 DIAGNOSIS — E785 Hyperlipidemia, unspecified: Secondary | ICD-10-CM

## 2013-02-28 DIAGNOSIS — R059 Cough, unspecified: Secondary | ICD-10-CM

## 2013-02-28 LAB — TSH: TSH: 4.33 u[IU]/mL (ref 0.35–5.50)

## 2013-02-28 LAB — LIPID PANEL: HDL: 40.6 mg/dL (ref >39.00)

## 2013-02-28 MED ORDER — AZITHROMYCIN 250 MG PO TABS: ORAL_TABLET | ORAL | Status: DC

## 2013-02-28 MED ORDER — FLUCONAZOLE 150 MG PO TABS: 150.0000 mg | ORAL_TABLET | Freq: Once | ORAL | Status: DC

## 2013-02-28 MED ORDER — POTASSIUM CHLORIDE CRYS ER 10 MEQ PO TBCR: 20.0000 meq | EXTENDED_RELEASE_TABLET | Freq: Every day | ORAL | Status: DC

## 2013-02-28 MED ORDER — LEVOTHYROXINE SODIUM 137 MCG PO TABS: ORAL_TABLET | ORAL | Status: DC

## 2013-02-28 MED ORDER — ZOLPIDEM TARTRATE 10 MG PO TABS: 10.0000 mg | ORAL_TABLET | Freq: Every day | ORAL | Status: DC

## 2013-02-28 MED ORDER — PANTOPRAZOLE SODIUM 20 MG PO TBEC: 20.0000 mg | DELAYED_RELEASE_TABLET | Freq: Every day | ORAL | Status: DC

## 2013-02-28 NOTE — Patient Instructions (Signed)
It was good to see you today. Test(s) ordered today. Your results will be released to [place] (or called to you) after review, usually within 72hours after test completion. If any changes need to be made, you will be notified at that same time. Zpak antibiotics - Your prescription(s) have been submitted to your pharmacy. Please take as directed and contact our office if you believe you are having problem(s) with the medication(s). Zyrtec/Claritin and Mucinex or Robitussin for cough Alternate between ibuprofen and tylenol for aches, pain and fever symptoms as discussed Hydrate, rest and call if worse or unimproved

## 2013-02-28 NOTE — Assessment & Plan Note (Signed)
Prior PCP stopped Lipitor 07/2009 - reminded to work on diet/exercise and weight control to manage same Check lipids annually

## 2013-02-28 NOTE — Progress Notes (Signed)
Subjective:    Patient ID: Tamara Morales, female    DOB: 12/17/1930, 79 y.o.   MRN: [id number]  Cough This is a new problem. The current episode started in the past 7 days. The problem has been unchanged. The problem occurs hourly. The cough is non-productive. Associated symptoms include chills, nasal congestion, postnasal drip, rhinorrhea, shortness of breath and sweats. Pertinent negatives include no chest pain, fever, headaches, sore throat, weight loss or wheezing. The symptoms are aggravated by exercise. She has tried OTC cough suppressant for the symptoms. The treatment provided mild relief. Her past medical history is significant for environmental allergies. There is no history of asthma or COPD.    Also reviewed chronic medical issues:  hypothyroid - related to surgical resection for goiter - no hx thyroid cancer - chronically feels fatigued and complains of skin and nail problems - chronic obesity with weight gain - no bowel changes  hypertension - diovan rx'd 04/2010 but caused nausea and weak feeling - changed to losartan 08/2010 with hctz as needed, titrated to max but still high in AM; then tribenzor rx'd 03/2012 - reports compliance with ongoing medical treatment and denies adverse side effects related to current therapy: no chest pain, edema or headache; no history of stroke, coronary artery disease (neg cath 03/2010 during hosp for chest pain) -   dyslipidemia - previously on lipitor but stopped 07/2009 (pt unclear as to why) - takes fish oil - denies muscle weakenss or GI upset -  GERD with reflux - reports compliance with ongoing medical treatment and no recent change in medication (on pantoprotazole). denies adverse side effects related to current therapy.    OA, B knees, B shoulders R>L - s/p B TKR - tries to keeps active walking but increasing pain in RUE/shoulder (side where she uses cane) - s/p PT tx at [facility] facility -   depression - working with counselor susan bond due  to life stressors; also on SSRI citalopam since 12/12 but takes only "prn" - feels improved -   Past Medical History  Diagnosis Date  . Morbid obesity   . MIGRAINE HEADACHE   . ARTHRITIS, KNEES, BILATERAL   . DEPRESSION   . GERD   . HYPERLIPIDEMIA   . HYPERTENSION   . Breast CA 2000    s/p R lumpectomy and XRT  . DJD (degenerative joint disease) of knee   . Urge incontinence   . HYPOTHYROIDISM     postsurgical  . OSA (obstructive sleep apnea) 01/17/2011 dx    Review of Systems  Constitutional: Positive for chills. Negative for fever and weight loss.  HENT: Positive for rhinorrhea and postnasal drip. Negative for sore throat.   Respiratory: Positive for cough and shortness of breath. Negative for wheezing.   Cardiovascular: Negative for chest pain, palpitations and leg swelling.  Gastrointestinal: Negative for nausea, vomiting and diarrhea.  Musculoskeletal: Positive for arthralgias. Negative for joint swelling.  Allergic/Immunologic: Positive for environmental allergies.  Neurological: Negative for weakness and headaches.       Objective:   Physical Exam BP 120/72  Pulse 96  Temp(Src) 97.3 F (36.3 C) (Oral)  Wt 291 lb 6.4 oz (132.178 kg)  BMI 47.06 kg/m2  SpO2 95% Wt Readings from Last 3 Encounters:  02/28/13 291 lb 6.4 oz (132.178 kg)  12/04/12 288 lb (130.636 kg)  11/30/12 286 lb 12.8 oz (130.092 kg)   Constitutional: She is obese; appears well-developed and well-nourished. Deep congested cough but no acute distress. Neck: Normal range  of motion. Neck supple. No JVD present. No thyromegaly present.  Cardiovascular: Normal rate, regular rhythm and normal heart sounds.  No murmur heard. trace edema BLE. Pulmonary/Chest: Effort normal at rest of breath sounds with coarse rhonchi bilaterally. No respiratory distress, but congested cough. She has no wheezes.  Abdomen:  obese, SNTND, +BS - no r/g Psychiatric: She has a normal mood and affect. Her behavior is normal.  Judgment and thought content normal.   Lab Results  Component Value Date   WBC 5.7 11/30/2012   HGB 12.9 11/30/2012   HCT 38.8 11/30/2012   PLT 156 11/30/2012   CHOL 175 03/13/2012   TRIG 217.0* 03/13/2012   HDL 37.60* 03/13/2012   LDLDIRECT 99.4 03/13/2012   ALT 12 03/13/2012   AST 16 03/13/2012   NA 141 11/30/2012   K 3.7 11/30/2012   CL 105 11/30/2012   CREATININE 0.78 11/30/2012   BUN 18 11/30/2012   CO2 28 11/30/2012   TSH 2.52 04/17/2012   INR 1.02 03/20/2010   HGBA1C 5.9 06/29/2011        Assessment & Plan:  See problem list. Medications and labs reviewed today.  Cough - concerning for atypical pneumonia  Check CXR Start antibiotics Cough suppression rx - otc preferred

## 2013-02-28 NOTE — Assessment & Plan Note (Signed)
Lab Results  Component Value Date   TSH 2.52 04/17/2012   The current medical regimen is effective;  continue present plan and medications. Check tsh semi annually and adjust as needed

## 2013-03-02 ENCOUNTER — Emergency Department (HOSPITAL_COMMUNITY)
Admission: EM | Admit: 2013-03-02 | Discharge: 2013-03-02 | Disposition: A | Discharge status: Home / Self Care | Payer: Medicare Other | Attending: Emergency Medicine | Admitting: Emergency Medicine

## 2013-03-02 ENCOUNTER — Encounter (HOSPITAL_COMMUNITY): Payer: Self-pay | Admitting: *Deleted

## 2013-03-02 DIAGNOSIS — R0602 Shortness of breath: Secondary | ICD-10-CM | POA: Diagnosis not present

## 2013-03-02 DIAGNOSIS — J209 Acute bronchitis, unspecified: Secondary | ICD-10-CM | POA: Diagnosis not present

## 2013-03-02 DIAGNOSIS — Z8669 Personal history of other diseases of the nervous system and sense organs: Secondary | ICD-10-CM | POA: Diagnosis not present

## 2013-03-02 DIAGNOSIS — Z79899 Other long term (current) drug therapy: Secondary | ICD-10-CM | POA: Diagnosis not present

## 2013-03-02 DIAGNOSIS — Z853 Personal history of malignant neoplasm of breast: Secondary | ICD-10-CM | POA: Diagnosis not present

## 2013-03-02 DIAGNOSIS — Z87891 Personal history of nicotine dependence: Secondary | ICD-10-CM | POA: Diagnosis not present

## 2013-03-02 DIAGNOSIS — K219 Gastro-esophageal reflux disease without esophagitis: Secondary | ICD-10-CM | POA: Insufficient documentation

## 2013-03-02 DIAGNOSIS — E039 Hypothyroidism, unspecified: Secondary | ICD-10-CM | POA: Insufficient documentation

## 2013-03-02 DIAGNOSIS — F329 Major depressive disorder, single episode, unspecified: Secondary | ICD-10-CM | POA: Insufficient documentation

## 2013-03-02 DIAGNOSIS — I10 Essential (primary) hypertension: Secondary | ICD-10-CM | POA: Diagnosis not present

## 2013-03-02 DIAGNOSIS — Z8679 Personal history of other diseases of the circulatory system: Secondary | ICD-10-CM | POA: Diagnosis not present

## 2013-03-02 DIAGNOSIS — Z7982 Long term (current) use of aspirin: Secondary | ICD-10-CM | POA: Insufficient documentation

## 2013-03-02 DIAGNOSIS — M129 Arthropathy, unspecified: Secondary | ICD-10-CM | POA: Diagnosis not present

## 2013-03-02 DIAGNOSIS — J40 Bronchitis, not specified as acute or chronic: Secondary | ICD-10-CM

## 2013-03-02 DIAGNOSIS — F3289 Other specified depressive episodes: Secondary | ICD-10-CM | POA: Insufficient documentation

## 2013-03-02 LAB — BASIC METABOLIC PANEL
CO2: 27 mEq/L (ref 19–32)
Chloride: 102 mEq/L (ref 96–112)
Creatinine, Ser: 0.86 mg/dL (ref 0.50–1.10)
Glucose, Bld: 143 mg/dL — ABNORMAL HIGH (ref 70–99)
Sodium: 140 mEq/L (ref 135–145)

## 2013-03-02 LAB — CBC WITH DIFFERENTIAL/PLATELET
Eosinophils Relative: 3 % (ref 0–5)
HCT: 40.5 % (ref 36.0–46.0)
Lymphocytes Relative: 21 % (ref 12–46)
Lymphs Abs: 1.6 10*3/uL (ref 0.7–4.0)
MCV: 91.4 fL (ref 78.0–100.0)
Monocytes Absolute: 0.5 10*3/uL (ref 0.1–1.0)
RBC: 4.43 MIL/uL (ref 3.87–5.11)
RDW: 13.6 % (ref 11.5–15.5)
WBC: 7.4 10*3/uL (ref 4.0–10.5)

## 2013-03-02 LAB — POCT I-STAT TROPONIN I: Troponin i, poc: 0.01 ng/mL (ref 0.00–0.08)

## 2013-03-02 MED ORDER — ALBUTEROL SULFATE HFA 108 (90 BASE) MCG/ACT IN AERS
2.0000 | INHALATION_SPRAY | Freq: Once | RESPIRATORY_TRACT | Status: AC
  Administered 2013-03-02: 2 via RESPIRATORY_TRACT
  Filled 2013-03-02: qty 6.7

## 2013-03-02 MED ORDER — IPRATROPIUM BROMIDE 0.02 % IN SOLN
0.5000 mg | Freq: Once | RESPIRATORY_TRACT | Status: AC
  Administered 2013-03-02: 0.5 mg via RESPIRATORY_TRACT
  Filled 2013-03-02: qty 2.5

## 2013-03-02 NOTE — ED Provider Notes (Signed)
CSN: [id number]     Arrival date & time 03/02/13  0240 History   First MD Initiated Contact with Patient 03/02/13 [id number]     Chief Complaint  Patient presents with  . Shortness of Breath   (Consider location/radiation/quality/duration/timing/severity/associated sxs/prior Treatment) HPI History provided by pt.   80yo F w/ h/o sig for morbid obesity, HTN, hyperlipidemia and obstructive sleep apnea presents w/ SOB.  Onset nasal congestion, rhinorrhea, sore throat and productive cough 5 days ago.  Upper respiratory tract sx resolved but cough has lingered.  CXR obtained by PCP 2 days ago and unremarkable.  Started on Zpak which she has been compliant with.  Felt as though she couldn't catch her breath last night.  Aggravated by laying down.  No associated fever, diaphoresis, CP, LE edema/pain.  No RF for PE.  No h/o COPD, asthma, CHF or CAD.  Per prior chart, had a chemical stress test in 11/2012 and no inducible ischemia at that time.   Past Medical History  Diagnosis Date  . Morbid obesity   . MIGRAINE HEADACHE   . ARTHRITIS, KNEES, BILATERAL   . DEPRESSION   . GERD   . HYPERLIPIDEMIA   . HYPERTENSION   . Breast CA 2000    s/p R lumpectomy and XRT  . DJD (degenerative joint disease) of knee   . Urge incontinence   . HYPOTHYROIDISM     postsurgical  . OSA (obstructive sleep apnea) 01/17/2011 dx   Past Surgical History  Procedure Laterality Date  . Cholecystectomy  1995  . Breast lumpectomy  2000  . Thyroidectomy    . Breast biopsy  2000  . Replacement total knee bilateral  1999, 2005  . Tubal ligation    . Parathyroidectomy      2-3 removed  . Tonsillectomy    . Adenoidectomy    . Right leg femur repaired     Family History  Problem Relation Age of Onset  . Emphysema Sister   . Coronary artery disease Neg Hx    History  Substance Use Topics  . Smoking status: Former Smoker -- 2 years    Types: Cigarettes    Quit date: 06/19/1974  . Smokeless tobacco: Never Used   Comment: Quit in late 20's  . Alcohol Use: No     Comment: rare   OB History   Grav Para Term Preterm Abortions TAB SAB Ect Mult Living                 Review of Systems  All other systems reviewed and are negative.    Allergies  Ciprofloxacin; Codeine; Epinephrine; Oxycodone; Paxil; Propoxyphene hcl; and Meloxicam  Home Medications   Current Outpatient Rx  Name  Route  Sig  Dispense  Refill  . aspirin 81 MG tablet   Oral   Take 81 mg by mouth daily.           [name] azithromycin (ZITHROMAX Z-PAK) 250 MG tablet      Take 2 tablets (500 mg) on  Day 1,  followed by 1 tablet (250 mg) once daily on Days 2 through 5.   6 each   0   . citalopram (CELEXA) 10 MG tablet      TAKE 1 TABLET BY MOUTH EVERY DAY   30 tablet   5   . FIBER PO   Oral   Take 1 tablet by mouth daily.         . fluconazole (DIFLUCAN) 150 MG tablet  Oral   Take 1 tablet (150 mg total) by mouth once. May repeat dose next day if needed   2 tablet   0   . furosemide (LASIX) 20 MG tablet      TAKE 1 TABLET BY MOUTH EVERY DAY AS NEEDED FOR ANKLE SWELLING   30 tablet   2   . levothyroxine (SYNTHROID) 137 MCG tablet      TAKE 1 BY MOUTH ONCE DAILY   30 tablet   5   . Naproxen Sodium (ALEVE PO)   Oral   Take 1 tablet by mouth 2 (two) times daily as needed (for pain).         . Olmesartan-Amlodipine-HCTZ (TRIBENZOR) 20-5-12.5 MG TABS   Oral   Take 1 tablet by mouth daily.   30 tablet   11   . Omega-3 Fatty Acids (FISH OIL PO)   Oral   Take 1 capsule by mouth daily.         . pantoprazole (PROTONIX) 20 MG tablet   Oral   Take 1 tablet (20 mg total) by mouth daily.   30 tablet   5   . phenazopyridine (AZO-TABS) 95 MG tablet   Oral   Take 95 mg by mouth daily as needed (for urinary pain).          . potassium chloride (K-DUR,KLOR-CON) 10 MEQ tablet   Oral   Take 2 tablets (20 mEq total) by mouth daily.   60 tablet   5   . zolpidem (AMBIEN) 10 MG tablet   Oral   Take 1  tablet (10 mg total) by mouth at bedtime.   30 tablet   5    BP 148/84  Pulse 92  Temp(Src) 98.3 F (36.8 C) (Oral)  Resp 14  SpO2 96% Physical Exam  Nursing note and vitals reviewed. Constitutional: She is oriented to person, place, and time. She appears well-developed and well-nourished. No distress.  Morbidly obese  HENT:  Head: Normocephalic and atraumatic.  Eyes:  Normal appearance  Neck: Normal range of motion.  Cardiovascular: Normal rate, regular rhythm and intact distal pulses.   Pulmonary/Chest: Effort normal and breath sounds normal. No respiratory distress. She exhibits no tenderness.  O2 sat 96% 2L [place].  Breath sounds diminished on R  Abdominal: Soft. Bowel sounds are normal. She exhibits no distension. There is no tenderness. There is no guarding.  Musculoskeletal: Normal range of motion.  No peripheral edema or calf tenderness  Neurological: She is alert and oriented to person, place, and time.  Skin: Skin is warm and dry. No rash noted.  Psychiatric: She has a normal mood and affect. Her behavior is normal.    ED Course  Procedures (including critical care time)  Date: 03/02/2013  Rate: 89  Rhythm: premature ventricular contractions (PVC)  QRS Axis: normal  Intervals: normal  ST/T Wave abnormalities: normal  Conduction Disutrbances:none  Narrative Interpretation:  [place]  Old EKG Reviewed: unchanged   Labs Review Labs Reviewed  BASIC METABOLIC PANEL - Abnormal; Notable for the following:    Potassium 3.3 (*)    Glucose, Bld 143 (*)    GFR calc non Af Amer 62 (*)    GFR calc Af Amer 72 (*)    All other components within normal limits  CBC WITH DIFFERENTIAL  POCT I-STAT TROPONIN I   Imaging Review Dg Chest 2 View  02/28/2013   *RADIOLOGY REPORT*  Clinical Data: Cough and rule out pneumonia.  CHEST - 2 VIEW  Comparison: 11/29/2012  Findings: Two views of the chest were obtained.  Again noted are surgical clips in the right axilla. Cortical thickening  and irregularity of right fifth and sixth ribs.  Coarse lung markings appear chronic.  Multilevel degenerative disease in the thoracic spine.  No focal airspace disease or pleural effusions.  Heart and mediastinum are stable.  IMPRESSION: No acute chest findings.  Cortical thickening and irregularity of right fifth and sixth ribs. This could be related to old fractures or post-treatment changes. Reportedly, the patient has a history of breast cancer.  If there is concern for metastatic disease, recommend whole body bone scan.   Original Report Authenticated By: Markus Daft, M.D.    MDM   1. Bronchitis    79yo F presents w/ productive cough x 1 week and new onset non-exertional SOB last night.  No h/o COPD, asthma, CHF or CAD; no inducible ischemia on chemical stress test in 11/2012.  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  On my exam, afebrile, O2 sat 96% on 2L, no respiratory distress, diminished breath sounds on right, coughing, no LE edema/ttp.  CXR 36 hours ago negative for acute abnormality.  EKG non-ischemic.  Troponin neg and labs otherwise unremarkable.  Pt received a breathing treatment and sx and exam improved; current O2 sat 96% rm air.  Per nursing staff, no dyspnea w/ ambulation.  Pt feels well enough to go home.  Dr. Florina Ou has examined and is ok w/ plan.  She is currently taking zpak which she will continue and discharged home w/ albuterol inhaler.  Return precautions discussed. 5:37 AM     Remer Macho, PA-C 03/02/13 [place], PA-C 03/02/13 [phone]

## 2013-03-02 NOTE — ED Provider Notes (Signed)
Medical screening examination/treatment/procedure(s) were conducted as a shared visit with non-physician practitioner(s) and myself.  I personally evaluated the patient during the encounter  Examined after receiving albuterol neb treatment. Patient is awake and alert, lungs are clear, oxygen saturation is 99% on room air. She is tremulous from the albuterol but otherwise states she feels significantly better.  Wynetta Fines, MD 03/02/13 [phone]

## 2013-03-02 NOTE — ED Notes (Signed)
Pt presents to ED via EMS c/o SOB and cough x 1 week. Upon arrival pt has Low grade temp of 99.6. She is from [place]. She called EMS d/t SOB that has onset x 2 days. She ws seen at her PCP on Friday where she had a X-Ray and rx'ed Z-pack. [place] arrival of fire department was 91% on RA. Saturation with 2L O2 is 96%. Diminished in the bases.

## 2013-03-02 NOTE — ED Notes (Signed)
Pt states she feels better after breathing treatment.

## 2013-03-02 NOTE — ED Notes (Signed)
Pt states legs are shaky prior to ambulating. Has portable pulse oximeter in place. Allowed legs to dangle prior to ambulating. States she feels she is unable to ambulate at this time.

## 2013-03-02 NOTE — ED Notes (Addendum)
Pt is alert and oriented x 4. States she is on day 2 of Z- pack. Denies any activity or pain in chest with SOB. States she is unable to take deep breath through her nose.

## 2013-03-02 NOTE — ED Notes (Signed)
Bed: WA21 Expected date: 03/02/13 Expected time: 2:36 AM Means of arrival: Ambulance Comments: Bed 21, EMS, 82 F, SOB

## 2013-03-03 ENCOUNTER — Telehealth: Payer: Self-pay | Admitting: *Deleted

## 2013-03-03 NOTE — Telephone Encounter (Signed)
[facility] Triage Call Report Triage Record Num: [id number] Operator: Benita Stabile Patient Name: Tamara Morales Call Date & Time: 03/02/2013 8:56:35PM Patient Phone: [phone] PCP: Gwendolyn Grant Patient Gender: Female PCP Fax : [phone] Patient DOB: [birth year] Practice Name: [facility] Reason for Call: Caller: [place]/Patient; PCP: Gwendolyn Grant (Adults only); CB#: [phone]; Call regarding Breathing Difficulty. Onset 02/28/13. Office visit 02/28/13, clear lungs, put on zpack,Zyrtec and Claritin this am. Not improving. Cough and shortness of breath continues. Afebrile. Albuterol at ED 9/13, made jittery-helped. Has inhaler at home, not using. Cough worse when laying down. Being treated by provider for infection and no improvement. Call within [place]. Care advice given per Cough Protocol. Pt will use Albuterol and f/u as needed by morning. Protocol(s) Used: Breathing Problems Protocol(s) Used: Cough - Adult Recommended Outcome per Protocol: Call Provider within 8 Hours Reason for Outcome: Cough without breathing difficulty Being treated by a provider for a secondary infection AND no improvement in symptoms, symptoms have worsened OR has new symptoms after following treatment plan for the time specified by provider. Care Advice: ~ Avoid any activity that produces symptoms until evaluated by provider. ~ Continue to follow treatment plan, including medications, until evaluated by provider. Go to ED IMMEDIATELY if developing increased shortness of breath, continuous cough, worsening fatigue, or unable to perform ADLs. ~ Drink more fluids -- water, low-sugar juices, tea and warm soup, especially chicken broth, are options. Avoid caffeinated or alcoholic beverages because they can increase the chance of dehydration. ~ ~ SYMPTOM / CONDITION MANAGEMENT Medication Advice: - Discontinue all nonprescription and alternative medications, especially stimulants, until  evaluated by provider. - Take prescribed medications as directed, following label instructions for the medication. - Do not change medications or dosing regimen until provider is consulted. - Know possible side effects of medication and what to do if they occur. - Tell provider all prescription, nonprescription or alternative medications that you take ~ Respiratory Hygiene: - Cover the nose/mouth tightly with a tissue when coughing or sneezing. - Use tissue 1 time and discard in the nearest waste receptacle. - Wash hands with soap and water or alcohol-based hand rub after coming into contact with respiratory secretions and contaminated objects/materials. - Alternatively when no tissue is available, cough into the bend of the elbow. - Avoid touching your eyes, nose or mouth. ~ See your provider today if you have a temperature 101.73F (38.6C) or higher, increased difficulty breathing, chest pain with cough, or cough with blood-tinged sputum. ~ Total water intake includes drinking water, water in beverages, and water contained in food. Fluids make up about 80% of the body's total hydration need. Individual fluid requirement to maintain hydration vary based on physical ~ 03/02/2013 9:12:16PM Page 1 of 2 [id number] [facility] Triage Call Report Patient

## 2013-03-13 ENCOUNTER — Encounter (HOSPITAL_COMMUNITY): Payer: Self-pay | Admitting: Emergency Medicine

## 2013-03-13 ENCOUNTER — Emergency Department (HOSPITAL_COMMUNITY)
Admission: EM | Admit: 2013-03-13 | Discharge: 2013-03-14 | Disposition: A | Discharge status: Home / Self Care | Payer: Medicare Other | Attending: Emergency Medicine | Admitting: Emergency Medicine

## 2013-03-13 ENCOUNTER — Ambulatory Visit (INDEPENDENT_AMBULATORY_CARE_PROVIDER_SITE_OTHER)
Admission: RE | Admit: 2013-03-13 | Discharge: 2013-03-13 | Disposition: A | Discharge status: Home / Self Care | Payer: Medicare Other | Source: Ambulatory Visit | Attending: Pulmonary Disease | Admitting: Pulmonary Disease

## 2013-03-13 ENCOUNTER — Encounter: Payer: Self-pay | Admitting: Pulmonary Disease

## 2013-03-13 ENCOUNTER — Ambulatory Visit: Payer: Medicare Other | Admitting: Pulmonary Disease

## 2013-03-13 ENCOUNTER — Ambulatory Visit (INDEPENDENT_AMBULATORY_CARE_PROVIDER_SITE_OTHER): Payer: Medicare Other | Admitting: Pulmonary Disease

## 2013-03-13 VITALS — BP 118/88 | HR 97 | Temp 97.4°F | Ht 66.0 in | Wt 291.0 lb

## 2013-03-13 DIAGNOSIS — Z853 Personal history of malignant neoplasm of breast: Secondary | ICD-10-CM | POA: Insufficient documentation

## 2013-03-13 DIAGNOSIS — Z8669 Personal history of other diseases of the nervous system and sense organs: Secondary | ICD-10-CM | POA: Diagnosis not present

## 2013-03-13 DIAGNOSIS — Z9089 Acquired absence of other organs: Secondary | ICD-10-CM | POA: Insufficient documentation

## 2013-03-13 DIAGNOSIS — Z792 Long term (current) use of antibiotics: Secondary | ICD-10-CM | POA: Diagnosis not present

## 2013-03-13 DIAGNOSIS — Z7982 Long term (current) use of aspirin: Secondary | ICD-10-CM | POA: Insufficient documentation

## 2013-03-13 DIAGNOSIS — F3289 Other specified depressive episodes: Secondary | ICD-10-CM | POA: Insufficient documentation

## 2013-03-13 DIAGNOSIS — B349 Viral infection, unspecified: Secondary | ICD-10-CM

## 2013-03-13 DIAGNOSIS — R0609 Other forms of dyspnea: Secondary | ICD-10-CM

## 2013-03-13 DIAGNOSIS — E039 Hypothyroidism, unspecified: Secondary | ICD-10-CM | POA: Diagnosis not present

## 2013-03-13 DIAGNOSIS — I10 Essential (primary) hypertension: Secondary | ICD-10-CM | POA: Diagnosis not present

## 2013-03-13 DIAGNOSIS — F329 Major depressive disorder, single episode, unspecified: Secondary | ICD-10-CM | POA: Insufficient documentation

## 2013-03-13 DIAGNOSIS — Z87891 Personal history of nicotine dependence: Secondary | ICD-10-CM | POA: Diagnosis not present

## 2013-03-13 DIAGNOSIS — R079 Chest pain, unspecified: Secondary | ICD-10-CM | POA: Diagnosis not present

## 2013-03-13 DIAGNOSIS — Z79899 Other long term (current) drug therapy: Secondary | ICD-10-CM | POA: Diagnosis not present

## 2013-03-13 DIAGNOSIS — B9789 Other viral agents as the cause of diseases classified elsewhere: Secondary | ICD-10-CM | POA: Insufficient documentation

## 2013-03-13 DIAGNOSIS — J3489 Other specified disorders of nose and nasal sinuses: Secondary | ICD-10-CM | POA: Insufficient documentation

## 2013-03-13 DIAGNOSIS — IMO0002 Reserved for concepts with insufficient information to code with codable children: Secondary | ICD-10-CM | POA: Diagnosis not present

## 2013-03-13 DIAGNOSIS — M1710 Unilateral primary osteoarthritis, unspecified knee: Secondary | ICD-10-CM | POA: Insufficient documentation

## 2013-03-13 DIAGNOSIS — R0600 Dyspnea, unspecified: Secondary | ICD-10-CM

## 2013-03-13 DIAGNOSIS — K219 Gastro-esophageal reflux disease without esophagitis: Secondary | ICD-10-CM | POA: Insufficient documentation

## 2013-03-13 LAB — CBC WITH DIFFERENTIAL/PLATELET
Hemoglobin: 13.2 g/dL (ref 12.0–15.0)
Lymphs Abs: 1.6 10*3/uL (ref 0.7–4.0)
Monocytes Relative: 6 % (ref 3–12)
Neutro Abs: 4.9 10*3/uL (ref 1.7–7.7)
Neutrophils Relative %: 69 % (ref 43–77)
RBC: 4.31 MIL/uL (ref 3.87–5.11)

## 2013-03-13 LAB — COMPREHENSIVE METABOLIC PANEL
Alkaline Phosphatase: 72 U/L (ref 39–117)
BUN: 11 mg/dL (ref 6–23)
CO2: 28 mEq/L (ref 19–32)
Chloride: 100 mEq/L (ref 96–112)
GFR calc Af Amer: 79 mL/min — ABNORMAL LOW (ref >90)
GFR calc non Af Amer: 68 mL/min — ABNORMAL LOW (ref >90)
Glucose, Bld: 104 mg/dL — ABNORMAL HIGH (ref 70–99)
Potassium: 3.4 mEq/L — ABNORMAL LOW (ref 3.5–5.1)
Total Bilirubin: 0.6 mg/dL (ref 0.3–1.2)

## 2013-03-13 LAB — URINALYSIS, ROUTINE W REFLEX MICROSCOPIC
Glucose, UA: NEGATIVE mg/dL
Ketones, ur: NEGATIVE mg/dL
Nitrite: NEGATIVE
Protein, ur: NEGATIVE mg/dL

## 2013-03-13 LAB — URINE MICROSCOPIC-ADD ON

## 2013-03-13 MED ORDER — IOHEXOL 350 MG/ML SOLN
80.0000 mL | Freq: Once | INTRAVENOUS | Status: AC | PRN
  Administered 2013-03-13: 80 mL via INTRAVENOUS

## 2013-03-13 MED ORDER — KETOROLAC TROMETHAMINE 15 MG/ML IJ SOLN
15.0000 mg | Freq: Once | INTRAMUSCULAR | Status: AC
  Administered 2013-03-13: 15 mg via INTRAVENOUS
  Filled 2013-03-13: qty 1

## 2013-03-13 MED ORDER — KETOROLAC TROMETHAMINE 30 MG/ML IJ SOLN: 30.0000 mg | Freq: Once | INTRAMUSCULAR | Status: DC

## 2013-03-13 MED ORDER — OSELTAMIVIR PHOSPHATE 75 MG PO CAPS: 75.0000 mg | ORAL_CAPSULE | Freq: Two times a day (BID) | ORAL | Status: DC

## 2013-03-13 MED ORDER — SODIUM CHLORIDE 0.9 % IV BOLUS (SEPSIS)
500.0000 mL | Freq: Once | INTRAVENOUS | Status: AC
  Administered 2013-03-13: 500 mL via INTRAVENOUS

## 2013-03-13 NOTE — ED Notes (Signed)
Pt states she thinks she has the flu and would like to be tested  Pt states her sxs include aching, cough, headache, nausea, and generally feels bad  Pt states sxs started today around 0500

## 2013-03-13 NOTE — ED Provider Notes (Signed)
CSN: [id number]     Arrival date & time 03/13/13  2128 History   First MD Initiated Contact with Patient 03/13/13 2210     Chief Complaint  Patient presents with  . flu like sxs    (Consider location/radiation/quality/duration/timing/severity/associated sxs/prior Treatment) The history is provided by the patient.  Tamara Morales is a 79 y.o. female history of migraines, obesity, hypertension, high cholesterol here presenting with cough and congestion. She has been coughing a week ago and was given Z-Pak. Her cough improved this past week. She woke up today with some sinus congestion. She told her doctor she had some short breaths so she had a CT angio chest outpatient that showed no PE or pneumonia. She also has some worsening sore throat and nausea and headaches and myalgias. Denies any vomiting or urinary symptoms.    Past Medical History  Diagnosis Date  . Morbid obesity   . MIGRAINE HEADACHE   . ARTHRITIS, KNEES, BILATERAL   . DEPRESSION   . GERD   . HYPERLIPIDEMIA   . HYPERTENSION   . Breast CA 2000    s/p R lumpectomy and XRT  . DJD (degenerative joint disease) of knee   . Urge incontinence   . HYPOTHYROIDISM     postsurgical  . OSA (obstructive sleep apnea) 01/17/2011 dx   Past Surgical History  Procedure Laterality Date  . Cholecystectomy  1995  . Breast lumpectomy  2000  . Thyroidectomy    . Breast biopsy  2000  . Replacement total knee bilateral  1999, 2005  . Tubal ligation    . Parathyroidectomy      2-3 removed  . Tonsillectomy    . Adenoidectomy    . Right leg femur repaired     Family History  Problem Relation Age of Onset  . Emphysema Sister   . Coronary artery disease Neg Hx    History  Substance Use Topics  . Smoking status: Former Smoker -- 0.25 packs/day for 2 years    Types: Cigarettes    Quit date: 06/19/1974  . Smokeless tobacco: Never Used     Comment: Quit in late 20's  . Alcohol Use: No     Comment: rare   OB History   Grav Para  Term Preterm Abortions TAB SAB Ect Mult Living                 Review of Systems  HENT: Positive for congestion.   Musculoskeletal:       Myalgias   All other systems reviewed and are negative.    Allergies  Ciprofloxacin; Codeine; Epinephrine; Oxycodone; Paxil; Propoxyphene hcl; and Meloxicam  Home Medications   Current Outpatient Rx  Name  Route  Sig  Dispense  Refill  . albuterol-ipratropium (COMBIVENT) 18-103 MCG/ACT inhaler   Inhalation   Inhale 2 puffs into the lungs every 6 (six) hours as needed for wheezing (SOB).         [name] aspirin 81 MG tablet   Oral   Take 81 mg by mouth daily.           . cetirizine (ZYRTEC) 10 MG tablet   Oral   Take 10 mg by mouth daily.         . citalopram (CELEXA) 10 MG tablet   Oral   Take 10 mg by mouth daily.         [name] FIBER PO   Oral   Take 1 tablet by mouth daily.         [name]  furosemide (LASIX) 20 MG tablet   Oral   Take 20 mg by mouth daily as needed (for ankle swelling).         [name] guaiFENesin (MUCINEX) 600 MG 12 hr tablet   Oral   Take 600 mg by mouth 2 (two) times daily.         [name] levothyroxine (SYNTHROID, LEVOTHROID) 137 MCG tablet   Oral   Take 137 mcg by mouth daily before breakfast.         . Olmesartan-Amlodipine-HCTZ (TRIBENZOR) 20-5-12.5 MG TABS   Oral   Take 1 tablet by mouth daily.   30 tablet   11   . Omega-3 Fatty Acids (FISH OIL PO)   Oral   Take 1 capsule by mouth daily.         . pantoprazole (PROTONIX) 20 MG tablet   Oral   Take 1 tablet (20 mg total) by mouth daily.   30 tablet   5   . phenazopyridine (AZO-TABS) 95 MG tablet   Oral   Take 95 mg by mouth daily as needed (for urinary pain).          . potassium chloride (K-DUR,KLOR-CON) 10 MEQ tablet   Oral   Take 2 tablets (20 mEq total) by mouth daily.   60 tablet   5   . zolpidem (AMBIEN) 10 MG tablet   Oral   Take 1 tablet (10 mg total) by mouth at bedtime.   30 tablet   5   . azithromycin (ZITHROMAX) 250 MG  tablet   Oral   Take 250 mg by mouth daily. 500 mg on day 1 250 mg on days 2-5         . fluconazole (DIFLUCAN) 150 MG tablet   Oral   Take 1 tablet (150 mg total) by mouth once. May repeat dose next day if needed   2 tablet   0   . loratadine (CLARITIN) 10 MG tablet   Oral   Take 10 mg by mouth daily.         [name] oseltamivir (TAMIFLU) 75 MG capsule   Oral   Take 1 capsule (75 mg total) by mouth every 12 (twelve) hours.   10 capsule   0    BP 174/81  Pulse 80  Temp(Src) 98.1 F (36.7 C) (Oral)  Resp 16  Ht $R'5\' 6"'rM$  (1.676 m)  Wt 290 lb (131.543 kg)  BMI 46.83 kg/m2  SpO2 91% Physical Exam  Nursing note and vitals reviewed. Constitutional: She is oriented to person, place, and time. She appears well-developed and well-nourished.  HENT:  Head: Normocephalic.  Mouth/Throat: Oropharynx is clear and moist.  Mild sinus pressure   Eyes: Conjunctivae are normal. Pupils are equal, round, and reactive to light.  Neck: Normal range of motion. Neck supple.  Cardiovascular: Normal rate, regular rhythm and normal heart sounds.   Pulmonary/Chest: Effort normal and breath sounds normal. No respiratory distress. She has no wheezes. She has no rales.  Abdominal: Soft. Bowel sounds are normal. She exhibits no distension. There is no tenderness. There is no rebound.  Musculoskeletal: Normal range of motion.  Neurological: She is alert and oriented to person, place, and time.  Skin: Skin is warm and dry.  Psychiatric: She has a normal mood and affect. Her behavior is normal. Judgment and thought content normal.    ED Course  Procedures (including critical care time) Labs Review Labs Reviewed  COMPREHENSIVE METABOLIC PANEL - Abnormal; Notable for the following:  Potassium 3.4 (*)    Glucose, Bld 104 (*)    Calcium 8.2 (*)    Albumin 3.2 (*)    GFR calc non Af Amer 68 (*)    GFR calc Af Amer 79 (*)    All other components within normal limits  CBC WITH DIFFERENTIAL  URINALYSIS,  ROUTINE W REFLEX MICROSCOPIC  POCT I-STAT TROPONIN I   Imaging Review Ct Angio Chest W/cm &/or Wo Cm  03/13/2013   CLINICAL DATA:  Chest pain and shortness of breath.  EXAM: CT ANGIOGRAPHY CHEST WITH CONTRAST  TECHNIQUE: Multidetector CT imaging of the chest was performed using the standard protocol during bolus administration of intravenous contrast. Multiplanar CT image reconstructions including MIPs were obtained to evaluate the vascular anatomy.  CONTRAST:  42mL OMNIPAQUE IOHEXOL 350 MG/ML SOLN  COMPARISON:  Chest radiographs dated 02/28/2013 and chest CTA dated 01/02/2011.  FINDINGS: Normally opacified pulmonary arteries with no pulmonary arterial filling defects. Interval mild patchy interstitial prominence, most pronounced in the superior aspects of the upper lobes. No lung masses or enlarged lymph nodes. No pleural fluid. The heart is enlarged. Thoracic spine degenerative changes, including changes of DISH.  No significant Change in ill-defined decreased density in the upper pole of the right kidney with dilatation of the renal collecting system or parapelvic cysts. An exophytic cyst arising from the upper pole of the left kidney has not changed significantly, measuring 2.7 cm in maximum diameter. A small exophytic cyst arising from the upper pole of the right kidney is unchanged. A mass-like area of ill-defined low density in the spleen is also stable, measuring 3.1 x 3.0 cm on image number 65.  Review of the MIP images confirms the above findings.  IMPRESSION: 1. No pulmonary emboli. 2. Interval patchy interstitial prominence, possibly due to interstitial pulmonary edema. 3. Cardiomegaly. 4. Stable ill-defined low density in the upper pole of the right kidney and mass-like area of low density in the spleen. The long-term stability are compatible with benign processes.   Electronically Signed   By: Enrique Sack   On: 03/13/2013 14:57     Date: 03/13/2013  Rate: 67  Rhythm: normal sinus rhythm  QRS  Axis: normal  Intervals: normal  ST/T Wave abnormalities: nonspecific ST changes  Conduction Disutrbances:none  Narrative Interpretation:   Old EKG Reviewed: unchanged    MDM   1. Viral syndrome    Tamara Morales is a 79 y.o. female here with congestion, myalgias. Will get labs and UA to r/o UTI. Otherwise likely URI. She got flu shot every year and has no sick contacts. She is requesting tamiflu. I told her if labs and UA nl then will give tamiflu.   11:39 PM Labs and UA unremarkable. Will give tamiflu.      Wandra Arthurs, MD 03/13/13 [phone]

## 2013-03-13 NOTE — Patient Instructions (Addendum)
Will schedule for scan of your chest to evaluate for blood clots.  Will call you with results If you have worsening of your chest tightness, you are to go to ER immediately.

## 2013-03-13 NOTE — Assessment & Plan Note (Signed)
The patient is having acute onset of shortness of breath this morning that awakened her from sleep.  This is much different and much worse than her usual chronic dyspnea on exertion.  I suspect this is not related to asthma, with no prior history, no response to albuterol this morning with her spell, and spirometry being totally normal at this time.  Given her symptoms and significant lower extremity edema, but we must rule out the possibility of thromboembolic disease.  We'll therefore schedule her for a CT and she for evaluation.  If this is unremarkable, we will need to decide whether she needs a cardiac catheterization to rule out significant coronary disease.  I have cautioned her that if her symptoms recur, she is to go to the emergency room immediately.

## 2013-03-13 NOTE — Progress Notes (Signed)
  Subjective:    Patient ID: Tamara Morales, female    DOB: August 08, 1930, 79 y.o.   MRN: [id number]  HPI The patient is an 79 year old female who comes in today as a self referral for dyspnea.  She is usually followed by Dr. Halford Chessman for obstructive sleep apnea, and has never been seen for pulmonary issues.  She has chronic dyspnea on exertion for years, probably related to obesity and deconditioning, but had acute worsening on September 14 and presented to the emergency room.  She was having cough, chest congestion, but was not bringing up mucus.  Her chest x-ray 2 days prior showed no acute process, and she was treated for presumed asthmatic bronchitis.  She was given a Z-Pak, a nebulizer treatment, and discharged on an inhaler.   the patient feels that she got back to her usual chronic dyspnea on exertion baseline.  She reports mild nasal congestion last night, and then was awakened from her sleep at 5 AM with the acute onset of shortness of breath.  She used her albuterol inhaler, and saw no difference in her breathing.  She complains of tightness in her chest, but no pressure or pleuritic component.  She has had a mild cough this morning, but no mucus and no chest congestion.  She has chronic lower extremity edema, and states it has not been worse most recently.  She has no history of thromboembolic disease.  She denies any calf discomfort.  The patient has no history of asthma, and very little smoking in the past.  She tells me that she had a stress test in June of this year that was unremarkable.  She wears oxygen at night with her CPAP machine, but does not wear it during her waking hours.   Review of Systems  Constitutional: Negative for fever and unexpected weight change.  HENT: Positive for congestion. Negative for ear pain, nosebleeds, sore throat, rhinorrhea, sneezing, trouble swallowing, dental problem, postnasal drip and sinus pressure.   Eyes: Negative for redness and itching.  Respiratory:  Positive for cough, chest tightness, shortness of breath and wheezing.   Cardiovascular: Negative for palpitations and leg swelling.  Gastrointestinal: Negative for nausea and vomiting.  Genitourinary: Negative for dysuria.  Musculoskeletal: Negative for joint swelling.  Skin: Negative for rash.  Neurological: Negative for headaches.  Hematological: Does not bruise/bleed easily.  Psychiatric/Behavioral: Negative for dysphoric mood. The patient is nervous/anxious.        Objective:   Physical Exam Constitutional:  Morbidly obese female, no acute distress  HENT:  Nares patent without discharge  Oropharynx without exudate, palate and uvula are elongated.  Eyes:  Perrla, eomi, no scleral icterus  Neck:  No JVD, no TMG  Cardiovascular:  Normal rate, regular rhythm, no rubs or gallops.  No murmurs        Intact distal pulses but greatly diminished.  Pulmonary :  Normal breath sounds, no stridor or respiratory distress   No rales, rhonchi, or wheezing  Abdominal:  Soft, nondistended, bowel sounds present.  No tenderness noted.   Musculoskeletal:  1+ lower extremity edema noted.  Lymph Nodes:  No cervical lymphadenopathy noted  Skin:  No cyanosis noted  Neurologic:  Alert, appropriate, moves all 4 extremities without obvious deficit.         Assessment & Plan:

## 2013-03-14 ENCOUNTER — Telehealth: Payer: Self-pay | Admitting: Pulmonary Disease

## 2013-03-14 NOTE — Telephone Encounter (Signed)
Spoke with the pt and she is aware of CT results. She wants to know what the next step is since CT was negative. Pt aware that [place] out today and is ok with call back next week. Please advise. [place] Bing, CMA

## 2013-03-14 NOTE — ED Notes (Signed)
Patient is alert and oriented x3.  She was given DC instructions and follow up visit instructions.  Patient gave verbal understanding. She was DC ambulatory under her own power to home.  V/S stable.  He was not showing any signs of distress on DC 

## 2013-03-15 LAB — URINE CULTURE: Colony Count: 60000

## 2013-03-17 NOTE — Telephone Encounter (Signed)
Please let pt know that I can find no pulmonary etiology for her sudden onset of symptoms.  I will send Dr. Asa Lente a note to consider a cardiac evaluation.

## 2013-03-18 ENCOUNTER — Telehealth: Payer: Self-pay | Admitting: Internal Medicine

## 2013-03-18 DIAGNOSIS — R0600 Dyspnea, unspecified: Secondary | ICD-10-CM

## 2013-03-18 DIAGNOSIS — E785 Hyperlipidemia, unspecified: Secondary | ICD-10-CM

## 2013-03-18 DIAGNOSIS — I10 Essential (primary) hypertension: Secondary | ICD-10-CM

## 2013-03-18 NOTE — Telephone Encounter (Signed)
Called pt was told by husband she is on her way home. Left msg with him for pt to RTC...lmb

## 2013-03-18 NOTE — Telephone Encounter (Signed)
Please call pt - because of her shortness of breath symptoms and no clear "pulmonary" problem on recent evaluation (here, Dr Gwenette Greet and ER), I will refer to cardiology for follow up to exclude a cardiac cause for her symptoms -  Also I will schedule a 2d echo along with the referral appointment -  please call here for OV or return to ER if symptoms worsen prior to referral/test completion  [facility]

## 2013-03-18 NOTE — Telephone Encounter (Signed)
I spoke with pt and made her aware of [place] repsonse. She needed nothing further

## 2013-03-18 NOTE — Telephone Encounter (Signed)
Notified pt with md response. Pt states Dr. Gwenette Greet already referred her to see Dr. Johnsie Cancel. Inform her will cx referral for cardiologist then. Will be contacted by [facility] with 2D echo once its been set-up. Pt states that she is needing something for panic attacks. Her son is very ill & been having more attacks. Inform pt md is out of office until Monday can see another md. Made appt to see Dr. Camila Li 03/19/13 @ 8:45am/lmb

## 2013-03-19 ENCOUNTER — Ambulatory Visit (INDEPENDENT_AMBULATORY_CARE_PROVIDER_SITE_OTHER): Payer: Medicare Other | Admitting: Internal Medicine

## 2013-03-19 ENCOUNTER — Emergency Department (HOSPITAL_COMMUNITY)
Admission: EM | Admit: 2013-03-19 | Discharge: 2013-03-20 | Disposition: A | Discharge status: Home / Self Care | Payer: Medicare Other | Attending: Emergency Medicine | Admitting: Emergency Medicine

## 2013-03-19 ENCOUNTER — Encounter: Payer: Self-pay | Admitting: Internal Medicine

## 2013-03-19 ENCOUNTER — Encounter (HOSPITAL_COMMUNITY): Payer: Self-pay | Admitting: Emergency Medicine

## 2013-03-19 VITALS — BP 138/74 | HR 108 | Temp 96.8°F | Resp 16 | Wt 289.0 lb

## 2013-03-19 DIAGNOSIS — Z7982 Long term (current) use of aspirin: Secondary | ICD-10-CM | POA: Diagnosis not present

## 2013-03-19 DIAGNOSIS — R197 Diarrhea, unspecified: Secondary | ICD-10-CM | POA: Diagnosis not present

## 2013-03-19 DIAGNOSIS — F3289 Other specified depressive episodes: Secondary | ICD-10-CM | POA: Insufficient documentation

## 2013-03-19 DIAGNOSIS — Z853 Personal history of malignant neoplasm of breast: Secondary | ICD-10-CM | POA: Diagnosis not present

## 2013-03-19 DIAGNOSIS — K219 Gastro-esophageal reflux disease without esophagitis: Secondary | ICD-10-CM | POA: Insufficient documentation

## 2013-03-19 DIAGNOSIS — F410 Panic disorder [episodic paroxysmal anxiety] without agoraphobia: Secondary | ICD-10-CM

## 2013-03-19 DIAGNOSIS — N201 Calculus of ureter: Secondary | ICD-10-CM | POA: Diagnosis not present

## 2013-03-19 DIAGNOSIS — Z23 Encounter for immunization: Secondary | ICD-10-CM

## 2013-03-19 DIAGNOSIS — R1031 Right lower quadrant pain: Secondary | ICD-10-CM | POA: Diagnosis not present

## 2013-03-19 DIAGNOSIS — Z79899 Other long term (current) drug therapy: Secondary | ICD-10-CM | POA: Diagnosis not present

## 2013-03-19 DIAGNOSIS — F329 Major depressive disorder, single episode, unspecified: Secondary | ICD-10-CM | POA: Diagnosis not present

## 2013-03-19 DIAGNOSIS — M1710 Unilateral primary osteoarthritis, unspecified knee: Secondary | ICD-10-CM | POA: Diagnosis not present

## 2013-03-19 DIAGNOSIS — I10 Essential (primary) hypertension: Secondary | ICD-10-CM | POA: Insufficient documentation

## 2013-03-19 DIAGNOSIS — Z87891 Personal history of nicotine dependence: Secondary | ICD-10-CM | POA: Insufficient documentation

## 2013-03-19 DIAGNOSIS — G43909 Migraine, unspecified, not intractable, without status migrainosus: Secondary | ICD-10-CM | POA: Insufficient documentation

## 2013-03-19 DIAGNOSIS — E039 Hypothyroidism, unspecified: Secondary | ICD-10-CM | POA: Insufficient documentation

## 2013-03-19 DIAGNOSIS — N23 Unspecified renal colic: Secondary | ICD-10-CM | POA: Diagnosis not present

## 2013-03-19 DIAGNOSIS — N281 Cyst of kidney, acquired: Secondary | ICD-10-CM | POA: Diagnosis not present

## 2013-03-19 LAB — COMPREHENSIVE METABOLIC PANEL
ALT: 11 U/L (ref 0–35)
AST: 15 U/L (ref 0–37)
Albumin: 3.3 g/dL — ABNORMAL LOW (ref 3.5–5.2)
Alkaline Phosphatase: 85 U/L (ref 39–117)
BUN: 15 mg/dL (ref 6–23)
CO2: 28 mEq/L (ref 19–32)
Calcium: 8.4 mg/dL (ref 8.4–10.5)
Chloride: 99 mEq/L (ref 96–112)
Creatinine, Ser: 1.01 mg/dL (ref 0.50–1.10)
Glucose, Bld: 131 mg/dL — ABNORMAL HIGH (ref 70–99)
Total Bilirubin: 0.4 mg/dL (ref 0.3–1.2)
Total Protein: 6.8 g/dL (ref 6.0–8.3)

## 2013-03-19 LAB — URINALYSIS, ROUTINE W REFLEX MICROSCOPIC
Glucose, UA: NEGATIVE mg/dL
Ketones, ur: NEGATIVE mg/dL
Nitrite: NEGATIVE
Protein, ur: NEGATIVE mg/dL
Specific Gravity, Urine: 1.023 (ref 1.005–1.030)
pH: 5.5 (ref 5.0–8.0)

## 2013-03-19 LAB — CBC WITH DIFFERENTIAL/PLATELET
Basophils Absolute: 0 10*3/uL (ref 0.0–0.1)
Basophils Relative: 0 % (ref 0–1)
Eosinophils Absolute: 0.1 10*3/uL (ref 0.0–0.7)
Eosinophils Relative: 1 % (ref 0–5)
HCT: 41.4 % (ref 36.0–46.0)
Hemoglobin: 14.1 g/dL (ref 12.0–15.0)
Lymphs Abs: 1 10*3/uL (ref 0.7–4.0)
MCH: 31.1 pg (ref 26.0–34.0)
MCHC: 34.1 g/dL (ref 30.0–36.0)
MCV: 91.4 fL (ref 78.0–100.0)
Monocytes Absolute: 0.8 10*3/uL (ref 0.1–1.0)
Monocytes Relative: 8 % (ref 3–12)
Neutro Abs: 7.9 10*3/uL — ABNORMAL HIGH (ref 1.7–7.7)
Neutrophils Relative %: 80 % — ABNORMAL HIGH (ref 43–77)
RDW: 13.5 % (ref 11.5–15.5)

## 2013-03-19 LAB — URINE MICROSCOPIC-ADD ON

## 2013-03-19 LAB — LIPASE, BLOOD: Lipase: 19 U/L (ref 11–59)

## 2013-03-19 MED ORDER — ONDANSETRON 8 MG PO TBDP
8.0000 mg | ORAL_TABLET | Freq: Once | ORAL | Status: AC
  Administered 2013-03-19: 8 mg via ORAL
  Filled 2013-03-19: qty 1

## 2013-03-19 MED ORDER — MORPHINE SULFATE 4 MG/ML IJ SOLN
4.0000 mg | INTRAMUSCULAR | Status: DC | PRN
  Administered 2013-03-19 (×2): 4 mg via INTRAVENOUS
  Filled 2013-03-19 (×2): qty 1

## 2013-03-19 MED ORDER — CLONAZEPAM 0.5 MG PO TABS: 0.5000 mg | ORAL_TABLET | Freq: Two times a day (BID) | ORAL | Status: DC | PRN

## 2013-03-19 MED ORDER — SODIUM CHLORIDE 0.9 % IV SOLN
1000.0000 mL | INTRAVENOUS | Status: DC
  Administered 2013-03-19 – 2013-03-20 (×2): 1000 mL via INTRAVENOUS

## 2013-03-19 MED ORDER — ONDANSETRON HCL 4 MG/2ML IJ SOLN
4.0000 mg | Freq: Once | INTRAMUSCULAR | Status: AC
  Administered 2013-03-19: 4 mg via INTRAVENOUS
  Filled 2013-03-19: qty 2

## 2013-03-19 MED ORDER — CITALOPRAM HYDROBROMIDE 40 MG PO TABS: 40.0000 mg | ORAL_TABLET | Freq: Every day | ORAL | Status: DC

## 2013-03-19 NOTE — ED Notes (Signed)
Bed: BT66 Expected date:  Expected time:  Means of arrival:  Comments: EMS/abdominal pain-s/p nephrectomy-79 yo female

## 2013-03-19 NOTE — Progress Notes (Signed)
Subjective:    HPI  C/o anxiety and panic attacks, stress - son had a stroke (remote h/o panic attacks). She is on Celexa 30 mg/d  C/o elevated BP readings (156-184/107).  Reviewed prior notes.  C/o feeling very tired as well - hasn't been sleeping well.  Hasn't been feeling well for last few days.  Having dry cough, some chest tightness with this.  Attributes both of these things to seasonal allergies.  Now feeling substernal chest tightness associated with mild dyspnea, but not relieved by rest.  Unsure if exertional as does not regularly exercise.  Comes and goes intermittently.  Last happened this morning with high blood pressure readings, none currently.  No nausea/vomiting or diaphoresis associated with this.  Appetite down recently.  + increased dyspnea on exertion recently.  H/o OSA, on nightly CPAP.    Wt Readings from Last 3 Encounters:  03/19/13 289 lb (131.09 kg)  03/13/13 290 lb (131.543 kg)  03/13/13 291 lb (131.997 kg)   BP Readings from Last 3 Encounters:  03/19/13 138/74  03/13/13 174/79  03/13/13 118/88      Denies fevers/chills, chest pain, HA, dizziness, nausea/vomiting, abd pain, diarrhea, constipation, dysuria.  No leg swelling recently.  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  Does tend to get seasonal allergy flares.  No recent prolonged immobility.  No current hormonal medicines.  No personal or family hx blood clots.  Non smoker. No personal or family history of CAD. Pt states she had normal heart catheterization about 3 years ago.  As far as HTN - reports compliance with med - only on HCTZ $Remov'25mg'dCTGjg$  daily.  Rarely takes lasix $RemoveBef'20mg'sgojnjjgni$ . Lab Results  Component Value Date   CHOL 196 02/28/2013   HDL 40.60 02/28/2013   LDLCALC 126* 02/28/2013   LDLDIRECT 99.4 03/13/2012   TRIG 148.0 02/28/2013   CHOLHDL 5 02/28/2013   Lab Results  Component Value Date   CREATININE 0.80 03/13/2013       Past Medical History  Diagnosis Date  . Morbid obesity   . MIGRAINE HEADACHE    . ARTHRITIS, KNEES, BILATERAL   . DEPRESSION   . GERD   . HYPERLIPIDEMIA   . HYPERTENSION   . Breast CA 2000    s/p R lumpectomy and XRT  . DJD (degenerative joint disease) of knee   . Urge incontinence   . HYPOTHYROIDISM     postsurgical  . OSA (obstructive sleep apnea) 01/17/2011 dx    Past Surgical History  Procedure Laterality Date  . Cholecystectomy  1995  . Breast lumpectomy  2000  . Thyroidectomy    . Breast biopsy  2000  . Replacement total knee bilateral  1999, 2005  . Tubal ligation    . Parathyroidectomy      2-3 removed  . Tonsillectomy    . Adenoidectomy    . Right leg femur repaired     Family History  Problem Relation Age of Onset  . Emphysema Sister   . Coronary artery disease Neg Hx    Current Outpatient Prescriptions on File Prior to Visit  Medication Sig Dispense Refill  . albuterol-ipratropium (COMBIVENT) 18-103 MCG/ACT inhaler Inhale 2 puffs into the lungs every 6 (six) hours as needed for wheezing (SOB).      [name] aspirin 81 MG tablet Take 81 mg by mouth daily.        [name] azithromycin (ZITHROMAX) 250 MG tablet Take 250 mg by mouth daily. 500 mg on day 1 250 mg on days 2-5      .  cetirizine (ZYRTEC) 10 MG tablet Take 10 mg by mouth daily.      . citalopram (CELEXA) 10 MG tablet Take 10 mg by mouth daily.      [name] FIBER PO Take 1 tablet by mouth daily.      . fluconazole (DIFLUCAN) 150 MG tablet Take 1 tablet (150 mg total) by mouth once. May repeat dose next day if needed  2 tablet  0  . furosemide (LASIX) 20 MG tablet Take 20 mg by mouth daily as needed (for ankle swelling).      [name] guaiFENesin (MUCINEX) 600 MG 12 hr tablet Take 600 mg by mouth 2 (two) times daily.      [name] levothyroxine (SYNTHROID, LEVOTHROID) 137 MCG tablet Take 137 mcg by mouth daily before breakfast.      . loratadine (CLARITIN) 10 MG tablet Take 10 mg by mouth daily.      . Olmesartan-Amlodipine-HCTZ (TRIBENZOR) 20-5-12.5 MG TABS Take 1 tablet by mouth daily.  30 tablet  11  . Omega-3  Fatty Acids (FISH OIL PO) Take 1 capsule by mouth daily.      [name] oseltamivir (TAMIFLU) 75 MG capsule Take 1 capsule (75 mg total) by mouth every 12 (twelve) hours.  10 capsule  0  . pantoprazole (PROTONIX) 20 MG tablet Take 1 tablet (20 mg total) by mouth daily.  30 tablet  5  . phenazopyridine (AZO-TABS) 95 MG tablet Take 95 mg by mouth daily as needed (for urinary pain).       . potassium chloride (K-DUR,KLOR-CON) 10 MEQ tablet Take 2 tablets (20 mEq total) by mouth daily.  60 tablet  5  . zolpidem (AMBIEN) 10 MG tablet Take 1 tablet (10 mg total) by mouth at bedtime.  30 tablet  5   No current facility-administered medications on file prior to visit.    Review of Systems Per HPI    Objective:   Physical Exam  Nursing note and vitals reviewed. Constitutional: She appears well-developed and well-nourished. No distress.  obese  HENT:  Head: Normocephalic and atraumatic.  Mouth/Throat: Oropharynx is clear and moist. No oropharyngeal exudate.  Eyes: Conjunctivae and EOM are normal. Pupils are equal, round, and reactive to light.  Neck: Normal range of motion. Neck supple. No hepatojugular reflux and no JVD present. Carotid bruit is not present.  Cardiovascular: Normal rate, regular rhythm, normal heart sounds and intact distal pulses.   No murmur heard. occ skipped beats  Pulmonary/Chest: Effort normal and breath sounds normal. No respiratory distress. She has no wheezes. She has no rales.  Musculoskeletal: She exhibits no edema (nonpitting edema).  Skin: Skin is warm and dry. No rash noted.  Psychiatric: She has a normal mood and affect.       Assessment & Plan:

## 2013-03-19 NOTE — Assessment & Plan Note (Signed)
We increased Celexa from 30 to 40 mg/d

## 2013-03-19 NOTE — ED Notes (Signed)
Pt reports having sudden onset, 8/10 RLQ abdominal pain since 1600. Pt reports nausea, and diarrhea, denies vomiting.  Pt denies and GU complaints at this time.

## 2013-03-19 NOTE — ED Provider Notes (Signed)
CSN: [id number]     Arrival date & time 03/19/13  1955 History   First MD Initiated Contact with Patient 03/19/13 2059     Chief Complaint  Patient presents with  . Abdominal Pain    Patient is a 79 y.o. female presenting with abdominal pain. The history is provided by the patient.  Abdominal Pain Pain location:  RUQ and RLQ Pain quality: aching   Pain radiates to:  Does not radiate Pain severity:  Severe Onset quality:  Gradual Duration:  5 hours Timing:  Constant Progression:  Worsening Chronicity:  New Relieved by:  Nothing Worsened by:  Nothing tried Associated symptoms: diarrhea (one loose stool)   Associated symptoms: no chills, no dysuria, no fever, no nausea and no vomiting     Past Medical History  Diagnosis Date  . Morbid obesity   . MIGRAINE HEADACHE   . ARTHRITIS, KNEES, BILATERAL   . DEPRESSION   . GERD   . HYPERLIPIDEMIA   . HYPERTENSION   . Breast CA 2000    s/p R lumpectomy and XRT  . DJD (degenerative joint disease) of knee   . Urge incontinence   . HYPOTHYROIDISM     postsurgical  . OSA (obstructive sleep apnea) 01/17/2011 dx   Past Surgical History  Procedure Laterality Date  . Cholecystectomy  1995  . Breast lumpectomy  2000  . Thyroidectomy    . Breast biopsy  2000  . Replacement total knee bilateral  1999, 2005  . Tubal ligation    . Parathyroidectomy      2-3 removed  . Tonsillectomy    . Adenoidectomy    . Right leg femur repaired     Family History  Problem Relation Age of Onset  . Emphysema Sister   . Coronary artery disease Neg Hx    History  Substance Use Topics  . Smoking status: Former Smoker -- 0.25 packs/day for 2 years    Types: Cigarettes    Quit date: 06/19/1974  . Smokeless tobacco: Never Used     Comment: Quit in late 20's  . Alcohol Use: No     Comment: rare   OB History   Grav Para Term Preterm Abortions TAB SAB Ect Mult Living                 Review of Systems  Constitutional: Negative for fever and  chills.  Gastrointestinal: Positive for abdominal pain and diarrhea (one loose stool). Negative for nausea and vomiting.  Genitourinary: Negative for dysuria.    Allergies  Ciprofloxacin; Codeine; Epinephrine; Oxycodone; Paxil; Propoxyphene hcl; and Meloxicam  Home Medications   Current Outpatient Rx  Name  Route  Sig  Dispense  Refill  . albuterol-ipratropium (COMBIVENT) 18-103 MCG/ACT inhaler   Inhalation   Inhale 2 puffs into the lungs every 6 (six) hours as needed for wheezing (SOB).         [name] aspirin 81 MG tablet   Oral   Take 81 mg by mouth daily.           . cetirizine (ZYRTEC) 10 MG tablet   Oral   Take 10 mg by mouth daily.         . citalopram (CELEXA) 40 MG tablet   Oral   Take 1 tablet (40 mg total) by mouth daily.   30 tablet   5   . clonazePAM (KLONOPIN) 0.5 MG tablet   Oral   Take 1 tablet (0.5 mg total) by mouth 2 (two)  times daily as needed for anxiety (or insomnia).   60 tablet   3   . FIBER PO   Oral   Take 1 tablet by mouth daily.         . furosemide (LASIX) 20 MG tablet   Oral   Take 20 mg by mouth daily as needed (for ankle swelling).         [name] guaiFENesin (MUCINEX) 600 MG 12 hr tablet   Oral   Take 600 mg by mouth 2 (two) times daily.         [name] levothyroxine (SYNTHROID, LEVOTHROID) 137 MCG tablet   Oral   Take 137 mcg by mouth daily before breakfast.         . loratadine (CLARITIN) 10 MG tablet   Oral   Take 10 mg by mouth daily.         . Olmesartan-Amlodipine-HCTZ (TRIBENZOR) 20-5-12.5 MG TABS   Oral   Take 1 tablet by mouth daily.   30 tablet   11   . Omega-3 Fatty Acids (FISH OIL PO)   Oral   Take 1 capsule by mouth daily.         . pantoprazole (PROTONIX) 20 MG tablet   Oral   Take 1 tablet (20 mg total) by mouth daily.   30 tablet   5   . phenazopyridine (AZO-TABS) 95 MG tablet   Oral   Take 95 mg by mouth daily as needed (for urinary pain).          . potassium chloride (K-DUR,KLOR-CON) 10  MEQ tablet   Oral   Take 2 tablets (20 mEq total) by mouth daily.   60 tablet   5    BP 129/52  Pulse 70  Temp(Src) 97.5 F (36.4 C)  Resp 16  Ht 5\' 6"  (1.676 m)  Wt 290 lb (131.543 kg)  BMI 46.83 kg/m2  SpO2 96% Physical Exam  Nursing note and vitals reviewed. Constitutional: No distress.  Obese   HENT:  Head: Normocephalic and atraumatic.  Right Ear: External ear normal.  Left Ear: External ear normal.  Eyes: Conjunctivae are normal. Right eye exhibits no discharge. Left eye exhibits no discharge. No scleral icterus.  Neck: Neck supple. No tracheal deviation present.  Cardiovascular: Normal rate, regular rhythm and intact distal pulses.   Pulmonary/Chest: Effort normal and breath sounds normal. No stridor. No respiratory distress. She has no wheezes. She has no rales.  Abdominal: Soft. Bowel sounds are normal. She exhibits no distension. There is tenderness in the right upper quadrant and right lower quadrant. There is CVA tenderness. There is no rebound and no guarding.  Musculoskeletal: She exhibits no edema and no tenderness.  Neurological: She is alert. She has normal strength. No sensory deficit. Cranial nerve deficit:  no gross defecits noted. She exhibits normal muscle tone. She displays no seizure activity. Coordination normal.  Skin: Skin is warm and dry. No rash noted.  Psychiatric: She has a normal mood and affect.    ED Course  Procedures (including critical care time) Labs Review Labs Reviewed  CBC WITH DIFFERENTIAL - Abnormal; Notable for the following:    Neutrophils Relative % 80 (*)    Neutro Abs 7.9 (*)    Lymphocytes Relative 10 (*)    All other components within normal limits  COMPREHENSIVE METABOLIC PANEL - Abnormal; Notable for the following:    Glucose, Bld 131 (*)    Albumin 3.3 (*)    GFR calc non Af  Amer 51 (*)    GFR calc Af Amer 59 (*)    All other components within normal limits  URINALYSIS, ROUTINE W REFLEX MICROSCOPIC - Abnormal;  Notable for the following:    APPearance CLOUDY (*)    Hgb urine dipstick LARGE (*)    Leukocytes, UA SMALL (*)    All other components within normal limits  URINE MICROSCOPIC-ADD ON - Abnormal; Notable for the following:    Squamous Epithelial / LPF MANY (*)    Bacteria, UA MANY (*)    All other components within normal limits  LIPASE, BLOOD   Imaging Review No results found. Medications  morphine 4 MG/ML injection 4 mg (4 mg Intravenous Given 03/19/13 2307)  0.9 %  sodium chloride infusion (1,000 mLs Intravenous New Bag/Given 03/20/13 0034)  ondansetron (ZOFRAN-ODT) disintegrating tablet 8 mg (8 mg Oral Given 03/19/13 2028)  ondansetron (ZOFRAN) injection 4 mg (4 mg Intravenous Given 03/19/13 2200)    MDM   Pt with hematuria and symptoms suggestive of a uti.  Will ct to evaluate further.  Anticipate that she will have a kidney stone.  Will dc home on pain meds and give referral to urology if ureteral stone is confirmed.  Case turned over to overnight MD pending CT results.   Kathalene Frames, MD 03/22/13 [phone]

## 2013-03-19 NOTE — Assessment & Plan Note (Signed)
Wt Readings from Last 3 Encounters:  03/19/13 289 lb (131.09 kg)  03/13/13 290 lb (131.543 kg)  03/13/13 291 lb (131.997 kg)

## 2013-03-19 NOTE — Assessment & Plan Note (Addendum)
Celexa 40 mg/d Start Klonopin prn

## 2013-03-20 ENCOUNTER — Emergency Department (HOSPITAL_COMMUNITY): Payer: Medicare Other

## 2013-03-20 DIAGNOSIS — N201 Calculus of ureter: Secondary | ICD-10-CM | POA: Diagnosis not present

## 2013-03-20 DIAGNOSIS — N281 Cyst of kidney, acquired: Secondary | ICD-10-CM | POA: Diagnosis not present

## 2013-03-20 MED ORDER — ONDANSETRON HCL 4 MG PO TABS: 4.0000 mg | ORAL_TABLET | Freq: Four times a day (QID) | ORAL | Status: DC

## 2013-03-20 MED ORDER — HYDROCODONE-ACETAMINOPHEN 5-325 MG PO TABS: 2.0000 | ORAL_TABLET | Freq: Four times a day (QID) | ORAL | Status: DC | PRN

## 2013-03-21 ENCOUNTER — Emergency Department (HOSPITAL_COMMUNITY)
Admission: EM | Admit: 2013-03-21 | Discharge: 2013-03-21 | Disposition: A | Discharge status: Home / Self Care | Payer: Medicare Other | Attending: Emergency Medicine | Admitting: Emergency Medicine

## 2013-03-21 ENCOUNTER — Encounter (HOSPITAL_COMMUNITY): Payer: Self-pay

## 2013-03-21 DIAGNOSIS — Z853 Personal history of malignant neoplasm of breast: Secondary | ICD-10-CM | POA: Diagnosis not present

## 2013-03-21 DIAGNOSIS — N200 Calculus of kidney: Secondary | ICD-10-CM | POA: Diagnosis not present

## 2013-03-21 DIAGNOSIS — K219 Gastro-esophageal reflux disease without esophagitis: Secondary | ICD-10-CM | POA: Diagnosis not present

## 2013-03-21 DIAGNOSIS — Z87891 Personal history of nicotine dependence: Secondary | ICD-10-CM | POA: Insufficient documentation

## 2013-03-21 DIAGNOSIS — N23 Unspecified renal colic: Secondary | ICD-10-CM | POA: Insufficient documentation

## 2013-03-21 DIAGNOSIS — F3289 Other specified depressive episodes: Secondary | ICD-10-CM | POA: Insufficient documentation

## 2013-03-21 DIAGNOSIS — F329 Major depressive disorder, single episode, unspecified: Secondary | ICD-10-CM | POA: Diagnosis not present

## 2013-03-21 DIAGNOSIS — Z79899 Other long term (current) drug therapy: Secondary | ICD-10-CM | POA: Diagnosis not present

## 2013-03-21 DIAGNOSIS — R109 Unspecified abdominal pain: Secondary | ICD-10-CM | POA: Diagnosis not present

## 2013-03-21 DIAGNOSIS — I10 Essential (primary) hypertension: Secondary | ICD-10-CM | POA: Diagnosis not present

## 2013-03-21 DIAGNOSIS — Z7982 Long term (current) use of aspirin: Secondary | ICD-10-CM | POA: Insufficient documentation

## 2013-03-21 DIAGNOSIS — Z8669 Personal history of other diseases of the nervous system and sense organs: Secondary | ICD-10-CM | POA: Insufficient documentation

## 2013-03-21 DIAGNOSIS — E039 Hypothyroidism, unspecified: Secondary | ICD-10-CM | POA: Diagnosis not present

## 2013-03-21 DIAGNOSIS — M1710 Unilateral primary osteoarthritis, unspecified knee: Secondary | ICD-10-CM | POA: Diagnosis not present

## 2013-03-21 MED ORDER — MORPHINE SULFATE 4 MG/ML IJ SOLN
6.0000 mg | Freq: Once | INTRAMUSCULAR | Status: AC
  Administered 2013-03-21: 6 mg via INTRAVENOUS
  Filled 2013-03-21: qty 2

## 2013-03-21 MED ORDER — ONDANSETRON 8 MG PO TBDP: ORAL_TABLET | ORAL | Status: DC

## 2013-03-21 MED ORDER — ONDANSETRON HCL 4 MG/2ML IJ SOLN
4.0000 mg | Freq: Once | INTRAMUSCULAR | Status: AC
Start: 2013-03-21 — End: 2013-03-21
  Administered 2013-03-21: 4 mg via INTRAVENOUS
  Filled 2013-03-21: qty 2

## 2013-03-21 MED ORDER — HYDROMORPHONE HCL 4 MG PO TABS: 4.0000 mg | ORAL_TABLET | ORAL | Status: DC | PRN

## 2013-03-21 MED ORDER — METOCLOPRAMIDE HCL 10 MG PO TABS: 10.0000 mg | ORAL_TABLET | Freq: Four times a day (QID) | ORAL | Status: DC | PRN

## 2013-03-21 NOTE — ED Provider Notes (Signed)
CT results shared with PT as below.  Her pain is well controlled and she is stable for d.c home. Rx provided with Urology referral. Precautions verbalized as understood  Results for orders placed during the hospital encounter of 03/19/13  CBC WITH DIFFERENTIAL      Result Value Range   WBC 9.8  4.0 - 10.5 K/uL   RBC 4.53  3.87 - 5.11 MIL/uL   Hemoglobin 14.1  12.0 - 15.0 g/dL   HCT 57.9  23.8 - 25.0 %   MCV 91.4  78.0 - 100.0 fL   MCH 31.1  26.0 - 34.0 pg   MCHC 34.1  30.0 - 36.0 g/dL   RDW 92.8  80.1 - 32.7 %   Platelets 171  150 - 400 K/uL   Neutrophils Relative % 80 (*) 43 - 77 %   Neutro Abs 7.9 (*) 1.7 - 7.7 K/uL   Lymphocytes Relative 10 (*) 12 - 46 %   Lymphs Abs 1.0  0.7 - 4.0 K/uL   Monocytes Relative 8  3 - 12 %   Monocytes Absolute 0.8  0.1 - 1.0 K/uL   Eosinophils Relative 1  0 - 5 %   Eosinophils Absolute 0.1  0.0 - 0.7 K/uL   Basophils Relative 0  0 - 1 %   Basophils Absolute 0.0  0.0 - 0.1 K/uL  COMPREHENSIVE METABOLIC PANEL      Result Value Range   Sodium 137  135 - 145 mEq/L   Potassium 3.6  3.5 - 5.1 mEq/L   Chloride 99  96 - 112 mEq/L   CO2 28  19 - 32 mEq/L   Glucose, Bld 131 (*) 70 - 99 mg/dL   BUN 15  6 - 23 mg/dL   Creatinine, Ser 2.05  0.50 - 1.10 mg/dL   Calcium 8.4  8.4 - 08.4 mg/dL   Total Protein 6.8  6.0 - 8.3 g/dL   Albumin 3.3 (*) 3.5 - 5.2 g/dL   AST 15  0 - 37 U/L   ALT 11  0 - 35 U/L   Alkaline Phosphatase 85  39 - 117 U/L   Total Bilirubin 0.4  0.3 - 1.2 mg/dL   GFR calc non Af Amer 51 (*) >90 mL/min   GFR calc Af Amer 59 (*) >90 mL/min  LIPASE, BLOOD      Result Value Range   Lipase 19  11 - 59 U/L  URINALYSIS, ROUTINE W REFLEX MICROSCOPIC      Result Value Range   Color, Urine YELLOW  YELLOW   APPearance CLOUDY (*) CLEAR   Specific Gravity, Urine 1.023  1.005 - 1.030   pH 5.5  5.0 - 8.0   Glucose, UA NEGATIVE  NEGATIVE mg/dL   Hgb urine dipstick LARGE (*) NEGATIVE   Bilirubin Urine NEGATIVE  NEGATIVE   Ketones, ur NEGATIVE   NEGATIVE mg/dL   Protein, ur NEGATIVE  NEGATIVE mg/dL   Urobilinogen, UA 0.2  0.0 - 1.0 mg/dL   Nitrite NEGATIVE  NEGATIVE   Leukocytes, UA SMALL (*) NEGATIVE  URINE MICROSCOPIC-ADD ON      Result Value Range   Squamous Epithelial / LPF MANY (*) RARE   WBC, UA 0-2  <3 WBC/hpf   RBC / HPF TOO NUMEROUS TO COUNT  <3 RBC/hpf   Bacteria, UA MANY (*) RARE   Ct Abdomen Pelvis Wo Contrast  03/20/2013   CLINICAL DATA:  Right flank pain, nausea, diarrhea.  EXAM: CT ABDOMEN AND PELVIS WITHOUT  CONTRAST  TECHNIQUE: Multidetector CT imaging of the abdomen and pelvis was performed following the standard protocol without intravenous contrast.  COMPARISON:  None.  FINDINGS: Dependent and bibasilar atelectasis or scarring. Heart is borderline in size. No effusions.  Numerous bilateral renal parapelvic cysts. Suspect small nonobstructing stone in the distal right ureter. No significant hydronephrosis. There is mild bilateral perinephric stranding. Right lower pole nonobstructing renal stone.  Liver, spleen, pancreas, adrenals are unremarkable. Sigmoid diverticulosis. No active diverticulitis. Small bowel is decompressed. No free fluid, free air or adenopathy. Prior hysterectomy. No adnexal masses. Urinary bladder is decompressed.  No acute bony abnormality.  IMPRESSION: 3 mm distal right ureteral stone. No significant hydronephrosis.  Bilateral renal parapelvic cysts.   Electronically Signed   By: Rolm Baptise M.D.   On: 03/20/2013 01:08    Teressa Lower, MD 03/21/13 [phone]

## 2013-03-21 NOTE — ED Provider Notes (Signed)
CSN: [id number]     Arrival date & time 03/21/13  1626 History   First MD Initiated Contact with Patient 03/21/13 1633     Chief Complaint  Patient presents with  . Flank Pain   (Consider location/radiation/quality/duration/timing/severity/associated sxs/prior Treatment) HPI This 79 year old female has known 59mm right ureteral lithiasis with renal colic x2 days ago by CT scan, she was pain-free yesterday but within the last 2 hours today developed recurrent right-sided flank pain with minimal if any nausea no vomiting, she is no dysuria, she does have some urinary frequency again over the last couple hours of small amounts, she has no chest pain no shortness breath no fever no vomiting and no other concerns, there is no treatment prior to arrival other than Ultram which did not help her pain which is severe. Past Medical History  Diagnosis Date  . Morbid obesity   . MIGRAINE HEADACHE   . ARTHRITIS, KNEES, BILATERAL   . DEPRESSION   . GERD   . HYPERLIPIDEMIA   . HYPERTENSION   . Breast CA 2000    s/p R lumpectomy and XRT  . DJD (degenerative joint disease) of knee   . Urge incontinence   . HYPOTHYROIDISM     postsurgical  . OSA (obstructive sleep apnea) 01/17/2011 dx   Past Surgical History  Procedure Laterality Date  . Cholecystectomy  1995  . Breast lumpectomy  2000  . Thyroidectomy    . Breast biopsy  2000  . Replacement total knee bilateral  1999, 2005  . Tubal ligation    . Parathyroidectomy      2-3 removed  . Tonsillectomy    . Adenoidectomy    . Right leg femur repaired     Family History  Problem Relation Age of Onset  . Emphysema Sister   . Coronary artery disease Neg Hx    History  Substance Use Topics  . Smoking status: Former Smoker -- 0.25 packs/day for 2 years    Types: Cigarettes    Quit date: 06/19/1974  . Smokeless tobacco: Never Used     Comment: Quit in late 20's  . Alcohol Use: No     Comment: rare   OB History   Grav Para Term Preterm  Abortions TAB SAB Ect Mult Living                 Review of Systems 10 Systems reviewed and are negative for acute change except as noted in the HPI. Allergies  Ciprofloxacin; Codeine; Epinephrine; Oxycodone; Paxil; Propoxyphene hcl; and Meloxicam  Home Medications   Current Outpatient Rx  Name  Route  Sig  Dispense  Refill  . albuterol-ipratropium (COMBIVENT) 18-103 MCG/ACT inhaler   Inhalation   Inhale 2 puffs into the lungs every 6 (six) hours as needed for wheezing (SOB).         [name] aspirin 81 MG tablet   Oral   Take 81 mg by mouth daily.           [name] FIBER PO   Oral   Take 1 tablet by mouth daily.         [name] levothyroxine (SYNTHROID, LEVOTHROID) 137 MCG tablet   Oral   Take 137 mcg by mouth daily before breakfast.         . loratadine (CLARITIN) 10 MG tablet   Oral   Take 10 mg by mouth daily.         . Olmesartan-Amlodipine-HCTZ (TRIBENZOR) 20-5-12.5 MG TABS   Oral  Take 1 tablet by mouth daily.   30 tablet   11   . Omega-3 Fatty Acids (FISH OIL PO)   Oral   Take 1 capsule by mouth daily.         . ondansetron (ZOFRAN) 4 MG tablet   Oral   Take 1 tablet (4 mg total) by mouth every 6 (six) hours.   12 tablet   0   . pantoprazole (PROTONIX) 20 MG tablet   Oral   Take 1 tablet (20 mg total) by mouth daily.   30 tablet   5   . phenazopyridine (AZO-TABS) 95 MG tablet   Oral   Take 95 mg by mouth daily as needed (for urinary pain).          . potassium chloride (K-DUR,KLOR-CON) 10 MEQ tablet   Oral   Take 2 tablets (20 mEq total) by mouth daily.   60 tablet   5   . citalopram (CELEXA) 40 MG tablet   Oral   Take 1 tablet (40 mg total) by mouth daily.   30 tablet   5   . HYDROmorphone (DILAUDID) 4 MG tablet   Oral   Take 1 tablet (4 mg total) by mouth every 4 (four) hours as needed for pain.   12 tablet   0   . metoCLOPramide (REGLAN) 10 MG tablet   Oral   Take 1 tablet (10 mg total) by mouth every 6 (six) hours as needed  (nausea/headache).   6 tablet   0   . ondansetron (ZOFRAN ODT) 8 MG disintegrating tablet      '8mg'$  ODT q4 hours prn nausea   4 tablet   0    BP 114/56  Pulse 73  Temp(Src) 98.9 F (37.2 C) (Oral)  Resp 18  SpO2 92% Physical Exam  Nursing note and vitals reviewed. Constitutional:  Awake, alert, nontoxic appearance.  HENT:  Head: Atraumatic.  Eyes: Right eye exhibits no discharge. Left eye exhibits no discharge.  Neck: Neck supple.  Cardiovascular: Normal rate and regular rhythm.   No murmur heard. Pulmonary/Chest: Effort normal and breath sounds normal. No respiratory distress. She has no wheezes. She has no rales. She exhibits no tenderness.  Patient has home oxygen and she uses at nighttime along with her CPAP machine, her pulse oximetry 90% on nasal cannula oxygen in the emergency department which is mildly hypoxic  Abdominal: Soft. Bowel sounds are normal. She exhibits no distension and no mass. There is tenderness. There is no rebound and no guarding.  Minimal right-sided mid and lower abdominal tenderness without rebound the rest of the abdomen is nontender and she has no CVA tenderness  Musculoskeletal: She exhibits no tenderness.  Baseline ROM, no obvious new focal weakness.  Neurological: She is alert.  Mental status and motor strength appears baseline for patient and situation.  Skin: No rash noted.  Psychiatric: She has a normal mood and affect.    ED Course  Procedures (including critical care time) Pt feels improved after observation and/or treatment in ED.Patient / Family / Caregiver informed of clinical course, understand medical decision-making process, and agree with plan. Labs Review Labs Reviewed - No data to display Imaging Review No results found.  MDM   1. Renal colic on right side    I doubt any other EMC precluding discharge at this time including, but not necessarily limited to the following:sepsis.    Babette Relic, MD 03/22/13 [phone]

## 2013-03-21 NOTE — ED Notes (Signed)
Per EMS patient having right flank pain. Was seen 2 days ago here at [facility]. Diagnosed with kidney stones. Has not passed any per her report. Having more discomfort today 7/10. 120/80, 80

## 2013-03-21 NOTE — ED Notes (Signed)
Bed: WA21 Expected date:  Expected time:  Means of arrival:  Comments: EMS-kidney stone

## 2013-03-21 NOTE — ED Notes (Signed)
Patient lives in independent living. Daughter will transport patient back

## 2013-03-22 ENCOUNTER — Telehealth: Payer: Self-pay | Admitting: Internal Medicine

## 2013-03-22 NOTE — Telephone Encounter (Signed)
Called Helene Kelp back she wanted to verigy pt med list. Pt was admitted there for the weekend. Pt was dx with kidney stome yesterday @ ED. Verified med list on what pt should be taking...lmb

## 2013-03-22 NOTE — Telephone Encounter (Signed)
Helene Kelp from [facility] needs some clarification about pts current medication.  Pt was recently admitted to hospital and was given medication there and now needs to know what she can or cannot take. Please call [phone] ask to be connected to [phone]. [phone] and ask for Helene Kelp, thank you.

## 2013-03-24 ENCOUNTER — Telehealth: Payer: Self-pay | Admitting: *Deleted

## 2013-03-24 NOTE — Telephone Encounter (Signed)
[facility] Triage Call Report Triage Record Num: [id number] Operator: Vaughan Sine Patient Name: Tamara Morales Call Date & Time: 03/22/2013 3:57:53PM Patient Phone: [phone] PCP: Gwendolyn Grant Patient Gender: Female PCP Fax : [phone] Patient DOB: 05-Apr-1931 [facility] Name[facility] Reason for Call: F/U regarding O2 sats after several hrs of 2L via [place] from O2 decrease after Morphine given at ED on 10-3. Current O2 sats 90% on RA at 1600 on 10-4, O2 stopped per standing orders. Pt is alert, oriented per LPN. Pt does wear 2L during sleep as ordered. Caller: Teresa/LPN; [facility]. PCP: Gwendolyn Grant (Adults only); CB#: [phone]; Protocol(s) Used: Information Only Call; No Symptom Triage (Adult) Recommended Outcome per Protocol: Provide Information or Advice Only Reason for Outcome: Health information question; person denies any symptoms, no triage required. Information provided from approved references or clinical experience. Care Advice: ~ 10/

## 2013-03-24 NOTE — Telephone Encounter (Signed)
[facility] Triage Call Report Triage Record Num: [id number] Operator: Vaughan Sine Patient Name: Tamara Morales Call Date & Time: 03/22/2013 1:30:51PM Patient Phone: [phone] PCP: Gwendolyn Grant Patient Gender: Female PCP Fax : [phone] Patient DOB: 05-Oct-1930 Practice Name[facility] Reason for Call: Caller: Teresa/LPN w/ [facility]; PCP: Gwendolyn Grant (Adults only); CB#: [phone]; Call regarding Re-admission. Pt was seen at ED on 10-3 for Kidney Stone, Right side. Pt came back w/ Reglan, Dilaudid $RemoveBeforeDE'4mg'[id number]$  1 tab PRN. Pt was given Morphine 6 IV x2 during ED visit. LPN placed Pt on 2L [place] per facility orders d/t O2 was at 74% upon returning from ED. Pt is currently 94% on 2L [place], Pt is acting normal, laughing and talking. LPN denies confusion. Pt also sleep w/ CPAP. LPN states Pt is asymptomatic at time of call. Advised LPN to only give Dilaudid as needed for pain as prescribed by ED d/t low O2 sats upon returning from ED on 10-4. Advised LPN to call back in 2 hrs w/ recheck of O2 sat on RA d/t Pt does not normally wear O2 continiously. Per standing orders: advised LPN to re-admit Pt w/ hospital discharge orders until office re-opens on 10-6. LPN verbalized understanding. Protocol(s) Used: Information Only Call; No Symptom Triage (Adult) Recommended Outcome per Protocol: Provide Information or Advice Only Reason for Outcome: Requesting information regarding scheduled exam, test or procedure; no triage required. Information provided from approved resources or clinical experience. Care Advice: ~ 03/22/2013 1:46:26PM Page 1 of 1 [email or web address]

## 2013-03-25 ENCOUNTER — Encounter: Payer: Self-pay | Admitting: Internal Medicine

## 2013-03-25 ENCOUNTER — Ambulatory Visit (INDEPENDENT_AMBULATORY_CARE_PROVIDER_SITE_OTHER): Payer: Medicare Other | Admitting: Internal Medicine

## 2013-03-25 VITALS — BP 130/82 | HR 93 | Temp 97.1°F | Ht 64.0 in | Wt 291.8 lb

## 2013-03-25 DIAGNOSIS — R110 Nausea: Secondary | ICD-10-CM

## 2013-03-25 DIAGNOSIS — R197 Diarrhea, unspecified: Secondary | ICD-10-CM

## 2013-03-25 DIAGNOSIS — N200 Calculus of kidney: Secondary | ICD-10-CM

## 2013-03-25 MED ORDER — METOCLOPRAMIDE HCL 10 MG PO TABS: 10.0000 mg | ORAL_TABLET | Freq: Three times a day (TID) | ORAL | Status: DC

## 2013-03-25 NOTE — Progress Notes (Signed)
HPI: Pt presents to the office today with complaints of nausea and diarrhea for two days. Pt was recently in the hospital for kidney stones in which she had some associated nausea. She was prescribed Zofran and Reglan to take as needed for symptoms. Pt has been taking the Reglan at least once a day to relieve nausea. Pt denies vomiting or abdominal pain. She states her nausea is worse with food and feels she just cannot eat a whole meal.   Past Medical History  Diagnosis Date  . Morbid obesity   . MIGRAINE HEADACHE   . ARTHRITIS, KNEES, BILATERAL   . DEPRESSION   . GERD   . HYPERLIPIDEMIA   . HYPERTENSION   . Breast CA 2000    s/p R lumpectomy and XRT  . DJD (degenerative joint disease) of knee   . Urge incontinence   . HYPOTHYROIDISM     postsurgical  . OSA (obstructive sleep apnea) 01/17/2011 dx    Current Outpatient Prescriptions  Medication Sig Dispense Refill  . albuterol-ipratropium (COMBIVENT) 18-103 MCG/ACT inhaler Inhale 2 puffs into the lungs every 6 (six) hours as needed for wheezing (SOB).      [name] aspirin 81 MG tablet Take 81 mg by mouth daily.        . citalopram (CELEXA) 40 MG tablet Take 1 tablet (40 mg total) by mouth daily.  30 tablet  5  . FIBER PO Take 1 tablet by mouth daily.      [name] levothyroxine (SYNTHROID, LEVOTHROID) 137 MCG tablet Take 137 mcg by mouth daily before breakfast.      . loratadine (CLARITIN) 10 MG tablet Take 10 mg by mouth daily.      . metoCLOPramide (REGLAN) 10 MG tablet Take 1 tablet (10 mg total) by mouth every 6 (six) hours as needed (nausea/headache).  6 tablet  0  . Olmesartan-Amlodipine-HCTZ (TRIBENZOR) 20-5-12.5 MG TABS Take 1 tablet by mouth daily.  30 tablet  11  . Omega-3 Fatty Acids (FISH OIL PO) Take 1 capsule by mouth daily.      . ondansetron (ZOFRAN ODT) 8 MG disintegrating tablet 8mg  ODT q4 hours prn nausea  4 tablet  0  . ondansetron (ZOFRAN) 4 MG tablet Take 1 tablet (4 mg total) by mouth every 6 (six) hours.  12 tablet  0  .  pantoprazole (PROTONIX) 20 MG tablet Take 1 tablet (20 mg total) by mouth daily.  30 tablet  5  . phenazopyridine (AZO-TABS) 95 MG tablet Take 95 mg by mouth daily as needed (for urinary pain).       . potassium chloride (K-DUR,KLOR-CON) 10 MEQ tablet Take 2 tablets (20 mEq total) by mouth daily.  60 tablet  5  . HYDROmorphone (DILAUDID) 4 MG tablet Take 1 tablet (4 mg total) by mouth every 4 (four) hours as needed for pain.  12 tablet  0   No current facility-administered medications for this visit.    Allergies  Allergen Reactions  . Ciprofloxacin     REACTION: edgy  . Codeine     REACTION: Nausea \\T \ vomitting  . Epinephrine     REACTION: rapid pulse, sweats  . Oxycodone Other (See Comments)    Patient felt like it altered her mental status "Crazy" . Hallucinations later as well.  [name] Paxil [Paroxetine Hydrochloride] Other (See Comments)    Sever depression   . Propoxyphene Hcl     REACTION: Nausea \\T \ vomitting  . Meloxicam Diarrhea    ROS:  Constitutional: Denies  fever, malaise, fatigue, headache or abrupt weight changes.   Gastrointestinal: Endorses nausea and diarrhea. Denies abdominal pain, bloating, constipation,  or blood in the stool.  GU: Denies frequency, urgency, pain with urination, blood in urine, odor or discharge..  Neurological: Denies dizziness, difficulty with memory, difficulty with speech or problems with balance and coordination.   No other specific complaints in a complete review of systems (except as listed in HPI above).  PE:  BP 130/82  Pulse 93  Temp(Src) 97.1 F (36.2 C) (Oral)  Ht $R'5\' 4"'OT$  (1.626 m)  Wt 291 lb 12 oz (132.337 kg)  BMI 50.05 kg/m2  SpO2 94% Wt Readings from Last 3 Encounters:  03/25/13 291 lb 12 oz (132.337 kg)  03/19/13 290 lb (131.543 kg)  03/19/13 289 lb (131.09 kg)    General: Appears their stated age, well developed, well nourished in NAD.. .  Abdomen: Soft and nontender. Normal bowel sounds, no bruits noted. No  distention or masses noted. Liver, spleen and kidneys non palpable. Psychiatric: Mood and affect normal. Behavior is normal. Judgment and thought content normal.   EKG:  BMET    Component Value Date/Time   NA 137 03/19/2013 2145   K 3.6 03/19/2013 2145   CL 99 03/19/2013 2145   CO2 28 03/19/2013 2145   GLUCOSE 131* 03/19/2013 2145   BUN 15 03/19/2013 2145   CREATININE 1.01 03/19/2013 2145   CALCIUM 8.4 03/19/2013 2145   GFRNONAA 51* 03/19/2013 2145   GFRAA 59* 03/19/2013 2145    Lipid Panel     Component Value Date/Time   CHOL 196 02/28/2013 1036   TRIG 148.0 02/28/2013 1036   HDL 40.60 02/28/2013 1036   CHOLHDL 5 02/28/2013 1036   VLDL 29.6 02/28/2013 1036   LDLCALC 126* 02/28/2013 1036    CBC    Component Value Date/Time   WBC 9.8 03/19/2013 2145   RBC 4.53 03/19/2013 2145   HGB 14.1 03/19/2013 2145   HCT 41.4 03/19/2013 2145   PLT 171 03/19/2013 2145   MCV 91.4 03/19/2013 2145   MCH 31.1 03/19/2013 2145   MCHC 34.1 03/19/2013 2145   RDW 13.5 03/19/2013 2145   LYMPHSABS 1.0 03/19/2013 2145   MONOABS 0.8 03/19/2013 2145   EOSABS 0.1 03/19/2013 2145   BASOSABS 0.0 03/19/2013 2145    Hgb A1C Lab Results  Component Value Date   HGBA1C 5.9 06/29/2011     Assessment and Plan: Nausea/Diarrhea Refilled Reglan $RemoveBeforeD'10mg'NpglluKiJEJKJL$  by mouth three times a day for nausea Take Zofran $RemoveBef'4mg'nnBMhKZusM$  by mouth every 6 hours as needed for nausea/vomiting Refer to GI if symptoms do not improve Follow up on Friday if no improvement in symptoms  Canyon Lohr S, Student-NP

## 2013-03-25 NOTE — Patient Instructions (Addendum)
Metoclopramide tablets What is this medicine? METOCLOPRAMIDE (met oh kloe PRA mide) is used to treat the symptoms of gastroesophageal reflux disease (GERD) like heartburn. It is also used to treat people with slow emptying of the stomach and intestinal tract. This medicine may be used for other purposes; ask your health care provider or pharmacist if you have questions. What should I tell my health care provider before I take this medicine? They need to know if you have any of these conditions: -breast cancer -depression -diabetes -heart failure -high blood pressure -kidney disease -liver disease -Parkinson's disease or a movement disorder -pheochromocytoma -seizures -stomach obstruction, bleeding, or perforation -an unusual or allergic reaction to metoclopramide, procainamide, sulfites, other medicines, foods, dyes, or preservatives -pregnant or trying to get pregnant -breast-feeding How should I use this medicine? Take this medicine by mouth with a glass of water. Follow the directions on the prescription label. Take this medicine on an empty stomach, about 30 minutes before eating. Take your doses at regular intervals. Do not take your medicine more often than directed. Do not stop taking except on the advice of your doctor or health care professional. A special MedGuide will be given to you by the pharmacist with each prescription and refill. Be sure to read this information carefully each time. Talk to your pediatrician regarding the use of this medicine in children. Special care may be needed. Overdosage: If you think you have taken too much of this medicine contact a poison control center or emergency room at once. NOTE: This medicine is only for you. Do not share this medicine with others. What if I miss a dose? If you miss a dose, take it as soon as you can. If it is almost time for your next dose, take only that dose. Do not take double or extra doses. What may interact with  this medicine? -acetaminophen -cyclosporine -digoxin -medicines for blood pressure -medicines for diabetes, including insulin -medicines for hay fever and other allergies -medicines for depression, especially an Monoamine Oxidase Inhibitor (MAOI) -medicines for Parkinson's disease, like levodopa -medicines for sleep or for pain -tetracycline This list may not describe all possible interactions. Give your health care provider a list of all the medicines, herbs, non-prescription drugs, or dietary supplements you use. Also tell them if you smoke, drink alcohol, or use illegal drugs. Some items may interact with your medicine. What should I watch for while using this medicine? It may take a few weeks for your stomach condition to start to get better. However, do not take this medicine for longer than 12 weeks. The longer you take this medicine, and the more you take it, the greater your chances are of developing serious side effects. If you are an elderly patient, a female patient, or you have diabetes, you may be at an increased risk for side effects from this medicine. Contact your doctor immediately if you start having movements you cannot control such as lip smacking, rapid movements of the tongue, involuntary or uncontrollable movements of the eyes, head, arms and legs, or muscle twitches and spasms. Patients and their families should watch out for worsening depression or thoughts of suicide. Also watch out for any sudden or severe changes in feelings such as feeling anxious, agitated, panicky, irritable, hostile, aggressive, impulsive, severely restless, overly excited and hyperactive, or not being able to sleep. If this happens, especially at the beginning of treatment or after a change in dose, call your doctor. Do not treat yourself for high fever. Ask  your doctor or health care professional for advice. You may get drowsy or dizzy. Do not drive, use machinery, or do anything that needs mental  alertness until you know how this drug affects you. Do not stand or sit up quickly, especially if you are an older patient. This reduces the risk of dizzy or fainting spells. Alcohol can make you more drowsy and dizzy. Avoid alcoholic drinks. What side effects may I notice from receiving this medicine? Side effects that you should report to your doctor or health care professional as soon as possible: -allergic reactions like skin rash, itching or hives, swelling of the face, lips, or tongue -abnormal production of milk in females -breast enlargement in both males and females -change in the way you walk -difficulty moving, speaking or swallowing -drooling, lip smacking, or rapid movements of the tongue -excessive sweating -fever -involuntary or uncontrollable movements of the eyes, head, arms and legs -irregular heartbeat or palpitations -muscle twitches and spasms -unusually weak or tired Side effects that usually do not require medical attention (report to your doctor or health care professional if they continue or are bothersome): -change in sex drive or performance -depressed mood -diarrhea -difficulty sleeping -headache -menstrual changes -restless or nervous This list may not describe all possible side effects. Call your doctor for medical advice about side effects. You may report side effects to FDA at 1-800-FDA-1088. Where should I keep my medicine? Keep out of the reach of children. Store at room temperature between 20 and 25 degrees C (68 and 77 degrees F). Protect from light. Keep container tightly closed. Throw away any unused medicine after the expiration date. NOTE: This sheet is a summary. It may not cover all possible information. If you have questions about this medicine, talk to your doctor, pharmacist, or health care provider.  2013, [employer]. (10/03/2011 1:04:38 PM)

## 2013-03-25 NOTE — Progress Notes (Signed)
Subjective:    Patient ID: Tamara Morales, female    DOB: 05-Jun-1931, 79 y.o.   MRN: [id number]  HPI  Pt presents to the clinic today with c/o nausea and diarrhea. This started about 1 week ago. It seems to occur every time that she eats. She was seen on 10/1 by Dr. Alain Marion for worsening anxiety and depression. Her celexa was increased to 40 mg daily and she was started on prn Klonipin. Later that night, she developed right flank pain with hematuria. She went to the ER where CT scan showed 3 mm right ureteral stone. She was given pain medication and zofran and referred to urology. She went back to the ER on 10/3 for continued right side pain. She was started on Reglan and d/c'd home. Since that time, she has been doing better. She still has intermittent nausea but is only taking the Reglan prn. She has not taken the zofran. She has only had 1 loose stool today. Pt denies blood in her stool.  Review of Systems      Past Medical History  Diagnosis Date  . Morbid obesity   . MIGRAINE HEADACHE   . ARTHRITIS, KNEES, BILATERAL   . DEPRESSION   . GERD   . HYPERLIPIDEMIA   . HYPERTENSION   . Breast CA 2000    s/p R lumpectomy and XRT  . DJD (degenerative joint disease) of knee   . Urge incontinence   . HYPOTHYROIDISM     postsurgical  . OSA (obstructive sleep apnea) 01/17/2011 dx    Current Outpatient Prescriptions  Medication Sig Dispense Refill  . albuterol-ipratropium (COMBIVENT) 18-103 MCG/ACT inhaler Inhale 2 puffs into the lungs every 6 (six) hours as needed for wheezing (SOB).      [name] aspirin 81 MG tablet Take 81 mg by mouth daily.        . citalopram (CELEXA) 40 MG tablet Take 1 tablet (40 mg total) by mouth daily.  30 tablet  5  . FIBER PO Take 1 tablet by mouth daily.      [name] HYDROmorphone (DILAUDID) 4 MG tablet Take 1 tablet (4 mg total) by mouth every 4 (four) hours as needed for pain.  12 tablet  0  . levothyroxine (SYNTHROID, LEVOTHROID) 137 MCG tablet Take 137 mcg by  mouth daily before breakfast.      . loratadine (CLARITIN) 10 MG tablet Take 10 mg by mouth daily.      . metoCLOPramide (REGLAN) 10 MG tablet Take 1 tablet (10 mg total) by mouth every 6 (six) hours as needed (nausea/headache).  6 tablet  0  . Olmesartan-Amlodipine-HCTZ (TRIBENZOR) 20-5-12.5 MG TABS Take 1 tablet by mouth daily.  30 tablet  11  . Omega-3 Fatty Acids (FISH OIL PO) Take 1 capsule by mouth daily.      . ondansetron (ZOFRAN ODT) 8 MG disintegrating tablet 8mg  ODT q4 hours prn nausea  4 tablet  0  . ondansetron (ZOFRAN) 4 MG tablet Take 1 tablet (4 mg total) by mouth every 6 (six) hours.  12 tablet  0  . pantoprazole (PROTONIX) 20 MG tablet Take 1 tablet (20 mg total) by mouth daily.  30 tablet  5  . phenazopyridine (AZO-TABS) 95 MG tablet Take 95 mg by mouth daily as needed (for urinary pain).       . potassium chloride (K-DUR,KLOR-CON) 10 MEQ tablet Take 2 tablets (20 mEq total) by mouth daily.  60 tablet  5   No current facility-administered medications for  this visit.    Allergies  Allergen Reactions  . Ciprofloxacin     REACTION: edgy  . Codeine     REACTION: Nausea$RemoveBeforeDE' \\T'rVRgkwMiVISjkit$ \ vomitting  . Epinephrine     REACTION: rapid pulse, sweats  . Oxycodone Other (See Comments)    Patient felt like it altered her mental status "Crazy" . Hallucinations later as well.  [name] Paxil [Paroxetine Hydrochloride] Other (See Comments)    Sever depression   . Propoxyphene Hcl     REACTION: Nausea$RemoveBeforeDE' \\T'cqDHWiZyOCfStSm$ \ vomitting  . Meloxicam Diarrhea    Family History  Problem Relation Age of Onset  . Emphysema Sister   . Coronary artery disease Neg Hx     History   Social History  . Marital Status: Married    Spouse Name: N/A    Number of Children: N/A  . Years of Education: N/A   Occupational History  . RETIRED     worked on office   Social History Main Topics  . Smoking status: Former Smoker -- 0.25 packs/day for 2 years    Types: Cigarettes    Quit date: 06/19/1974  . Smokeless tobacco:  Never Used     Comment: Quit in late 20's  . Alcohol Use: No     Comment: rare  . Drug Use: No  . Sexual Activity: Not on file   Other Topics Concern  . Not on file   Social History Narrative  . No narrative on file     Constitutional: Denies fever, malaise, fatigue, headache or abrupt weight changes.  Respiratory: Denies difficulty breathing, shortness of breath, cough or sputum production.   Cardiovascular: Denies chest pain, chest tightness, palpitations or swelling in the hands or feet.  Gastrointestinal: Pt reports nausea and diarrhea. Denies abdominal pain, bloating, constipation, diarrhea or blood in the stool.    No other specific complaints in a complete review of systems (except as listed in HPI above).  Objective:   Physical Exam  BP 130/82  Pulse 93  Temp(Src) 97.1 F (36.2 C) (Oral)  Ht $R'5\' 4"'uz$  (1.626 m)  Wt 291 lb 12 oz (132.337 kg)  BMI 50.05 kg/m2  SpO2 94% Wt Readings from Last 3 Encounters:  03/25/13 291 lb 12 oz (132.337 kg)  03/19/13 290 lb (131.543 kg)  03/19/13 289 lb (131.09 kg)    General: Appears her stated age, obese but well developed, well nourished in NAD. Cardiovascular: Normal rate and rhythm. S1,S2 noted.  No murmur, rubs or gallops noted. No JVD or BLE edema. No carotid bruits noted. Pulmonary/Chest: Normal effort and positive vesicular breath sounds. No respiratory distress. No wheezes, rales or ronchi noted.  Abdomen: Soft and nontender. Normal bowel sounds, no bruits noted. No distention or masses noted. Liver, spleen and kidneys non palpable.   BMET    Component Value Date/Time   NA 137 03/19/2013 2145   K 3.6 03/19/2013 2145   CL 99 03/19/2013 2145   CO2 28 03/19/2013 2145   GLUCOSE 131* 03/19/2013 2145   BUN 15 03/19/2013 2145   CREATININE 1.01 03/19/2013 2145   CALCIUM 8.4 03/19/2013 2145   GFRNONAA 51* 03/19/2013 2145   GFRAA 59* 03/19/2013 2145    Lipid Panel     Component Value Date/Time   CHOL 196 02/28/2013 1036   TRIG  148.0 02/28/2013 1036   HDL 40.60 02/28/2013 1036   CHOLHDL 5 02/28/2013 1036   VLDL 29.6 02/28/2013 1036   LDLCALC 126* 02/28/2013 1036    CBC  Component Value Date/Time   WBC 9.8 03/19/2013 2145   RBC 4.53 03/19/2013 2145   HGB 14.1 03/19/2013 2145   HCT 41.4 03/19/2013 2145   PLT 171 03/19/2013 2145   MCV 91.4 03/19/2013 2145   MCH 31.1 03/19/2013 2145   MCHC 34.1 03/19/2013 2145   RDW 13.5 03/19/2013 2145   LYMPHSABS 1.0 03/19/2013 2145   MONOABS 0.8 03/19/2013 2145   EOSABS 0.1 03/19/2013 2145   BASOSABS 0.0 03/19/2013 2145    Hgb A1C Lab Results  Component Value Date   HGBA1C 5.9 06/29/2011         Assessment & Plan:   Nausea and diarrhea secondary to ? Gastroparesis:  Will schedule reglan TID. If you develop diarrhea, cut back to BID Can take Zofran prn for nausea If not better in 1 week, will refer to GI for further evaluation  Kidney stones, right:  Continue pain medication as directed Follow up with urology  RTC in 1 week if you continue to have nausea, increased diarrhea or worsening flank pain

## 2013-03-27 ENCOUNTER — Telehealth: Payer: Self-pay

## 2013-03-27 NOTE — Telephone Encounter (Signed)
Continue antihistamine if no relief OV for possible steroid injection

## 2013-03-27 NOTE — Telephone Encounter (Signed)
Left vc mail for pt with info instructed to call back if any further questions...ds,cma

## 2013-03-27 NOTE — Telephone Encounter (Signed)
Pt called and stated she has decided to stop taking the Reglan medication, causing her to itch all over and it has seemed to do its job clearing up her bowel problem. She wants to know what can be done to stop the itching? Pt states she tried an antihistamine with no relief. Please advise...ds,cma

## 2013-03-28 ENCOUNTER — Encounter: Payer: Self-pay | Admitting: Internal Medicine

## 2013-03-28 ENCOUNTER — Ambulatory Visit (INDEPENDENT_AMBULATORY_CARE_PROVIDER_SITE_OTHER): Payer: Medicare Other | Admitting: Internal Medicine

## 2013-03-28 VITALS — BP 140/84 | HR 95 | Temp 97.7°F | Resp 16 | Wt 292.0 lb

## 2013-03-28 DIAGNOSIS — R21 Rash and other nonspecific skin eruption: Secondary | ICD-10-CM | POA: Diagnosis not present

## 2013-03-28 DIAGNOSIS — L299 Pruritus, unspecified: Secondary | ICD-10-CM | POA: Diagnosis not present

## 2013-03-28 DIAGNOSIS — T887XXA Unspecified adverse effect of drug or medicament, initial encounter: Secondary | ICD-10-CM | POA: Diagnosis not present

## 2013-03-28 DIAGNOSIS — T50905A Adverse effect of unspecified drugs, medicaments and biological substances, initial encounter: Secondary | ICD-10-CM

## 2013-03-28 MED ORDER — METHYLPREDNISOLONE ACETATE 80 MG/ML IJ SUSP
80.0000 mg | Freq: Once | INTRAMUSCULAR | Status: AC
  Administered 2013-03-28: 80 mg via INTRAMUSCULAR

## 2013-03-28 MED ORDER — PREDNISONE 10 MG PO TABS: ORAL_TABLET | ORAL | Status: DC

## 2013-03-28 NOTE — Progress Notes (Signed)
Subjective:    Patient ID: Tamara Morales, female    DOB: [birth year], 79 y.o.   MRN: [id number]  HPI  Pt presents to the clinic today with c/o itching. This started shortly after taking reglan. She was taking the reglan for probable gastroparesis. She did note improvement while on the medication. She has stopped taking it. She is itching all over but does not see a rash. She has tried an antihistamine OTC which has not helped.   Review of Systems  Past Medical History  Diagnosis Date  . Morbid obesity   . MIGRAINE HEADACHE   . ARTHRITIS, KNEES, BILATERAL   . DEPRESSION   . GERD   . HYPERLIPIDEMIA   . HYPERTENSION   . Breast CA 2000    s/p R lumpectomy and XRT  . DJD (degenerative joint disease) of knee   . Urge incontinence   . HYPOTHYROIDISM     postsurgical  . OSA (obstructive sleep apnea) 01/17/2011 dx    Current Outpatient Prescriptions  Medication Sig Dispense Refill  . albuterol-ipratropium (COMBIVENT) 18-103 MCG/ACT inhaler Inhale 2 puffs into the lungs every 6 (six) hours as needed for wheezing (SOB).      [name] aspirin 81 MG tablet Take 81 mg by mouth daily.        . citalopram (CELEXA) 40 MG tablet Take 1 tablet (40 mg total) by mouth daily.  30 tablet  5  . FIBER PO Take 1 tablet by mouth daily.      [name] HYDROmorphone (DILAUDID) 4 MG tablet Take 1 tablet (4 mg total) by mouth every 4 (four) hours as needed for pain.  12 tablet  0  . levothyroxine (SYNTHROID, LEVOTHROID) 137 MCG tablet Take 137 mcg by mouth daily before breakfast.      . loratadine (CLARITIN) 10 MG tablet Take 10 mg by mouth daily.      . metoCLOPramide (REGLAN) 10 MG tablet Take 1 tablet (10 mg total) by mouth 3 (three) times daily.  90 tablet  0  . Olmesartan-Amlodipine-HCTZ (TRIBENZOR) 20-5-12.5 MG TABS Take 1 tablet by mouth daily.  30 tablet  11  . Omega-3 Fatty Acids (FISH OIL PO) Take 1 capsule by mouth daily.      . ondansetron (ZOFRAN ODT) 8 MG disintegrating tablet '8mg'$  ODT q4 hours prn nausea   4 tablet  0  . ondansetron (ZOFRAN) 4 MG tablet Take 1 tablet (4 mg total) by mouth every 6 (six) hours.  12 tablet  0  . pantoprazole (PROTONIX) 20 MG tablet Take 1 tablet (20 mg total) by mouth daily.  30 tablet  5  . phenazopyridine (AZO-TABS) 95 MG tablet Take 95 mg by mouth daily as needed (for urinary pain).       . potassium chloride (K-DUR,KLOR-CON) 10 MEQ tablet Take 2 tablets (20 mEq total) by mouth daily.  60 tablet  5   No current facility-administered medications for this visit.    Allergies  Allergen Reactions  . Ciprofloxacin     REACTION: edgy  . Codeine     REACTION: Nausea' \\T'$ \ vomitting  . Epinephrine     REACTION: rapid pulse, sweats  . Oxycodone Other (See Comments)    Patient felt like it altered her mental status "Crazy" . Hallucinations later as well.  [name] Paxil [Paroxetine Hydrochloride] Other (See Comments)    Sever depression   . Propoxyphene Hcl     REACTION: Nausea' \\T'$ \ vomitting  . Meloxicam Diarrhea    Family History  Problem Relation Age of Onset  . Emphysema Sister   . Coronary artery disease Neg Hx     History   Social History  . Marital Status: Married    Spouse Name: N/A    Number of Children: N/A  . Years of Education: N/A   Occupational History  . RETIRED     worked on office   Social History Main Topics  . Smoking status: Former Smoker -- 0.25 packs/day for 2 years    Types: Cigarettes    Quit date: 06/19/1974  . Smokeless tobacco: Never Used     Comment: Quit in late 20's  . Alcohol Use: No     Comment: rare  . Drug Use: No  . Sexual Activity: Not on file   Other Topics Concern  . Not on file   Social History Narrative  . No narrative on file     Constitutional: Denies fever, malaise, fatigue, headache or abrupt weight changes.  Skin: Pt reports itching. Denies redness, rashes, lesions or ulcercations.    No other specific complaints in a complete review of systems (except as listed in HPI above).     Objective:    Physical Exam   BP 140/84  Pulse 95  Temp(Src) 97.7 F (36.5 C) (Oral)  Resp 16  Wt 292 lb (132.45 kg)  BMI 50.1 kg/m2  SpO2 95% Wt Readings from Last 3 Encounters:  03/28/13 292 lb (132.45 kg)  03/25/13 291 lb 12 oz (132.337 kg)  03/19/13 290 lb (131.543 kg)    General: Appears her stated age, well developed, well nourished in NAD. Skin: Warm, dry and intact. Generalized macular rash on erythematous base, noted on back- resembling allergic reaction.  Cardiovascular: Normal rate and rhythm. S1,S2 noted.  No murmur, rubs or gallops noted. No JVD or BLE edema. No carotid bruits noted. Pulmonary/Chest: Normal effort and positive vesicular breath sounds. No respiratory distress. No wheezes, rales or ronchi noted.    BMET    Component Value Date/Time   NA 137 03/19/2013 2145   K 3.6 03/19/2013 2145   CL 99 03/19/2013 2145   CO2 28 03/19/2013 2145   GLUCOSE 131* 03/19/2013 2145   BUN 15 03/19/2013 2145   CREATININE 1.01 03/19/2013 2145   CALCIUM 8.4 03/19/2013 2145   GFRNONAA 51* 03/19/2013 2145   GFRAA 59* 03/19/2013 2145    Lipid Panel     Component Value Date/Time   CHOL 196 02/28/2013 1036   TRIG 148.0 02/28/2013 1036   HDL 40.60 02/28/2013 1036   CHOLHDL 5 02/28/2013 1036   VLDL 29.6 02/28/2013 1036   LDLCALC 126* 02/28/2013 1036    CBC    Component Value Date/Time   WBC 9.8 03/19/2013 2145   RBC 4.53 03/19/2013 2145   HGB 14.1 03/19/2013 2145   HCT 41.4 03/19/2013 2145   PLT 171 03/19/2013 2145   MCV 91.4 03/19/2013 2145   MCH 31.1 03/19/2013 2145   MCHC 34.1 03/19/2013 2145   RDW 13.5 03/19/2013 2145   LYMPHSABS 1.0 03/19/2013 2145   MONOABS 0.8 03/19/2013 2145   EOSABS 0.1 03/19/2013 2145   BASOSABS 0.0 03/19/2013 2145    Hgb A1C Lab Results  Component Value Date   HGBA1C 5.9 06/29/2011        Assessment & Plan:   Allergic Dermatitis secondary to medication, new onset:  80 mg Depo IM today eRx for pred taper Can continue benadryl OTC at night to help you  sleep  RTC as needed  or if rash persist or worsen

## 2013-03-28 NOTE — Patient Instructions (Signed)
Itching Itching is a symptom that can be caused by many things. These include skin problems (including infections) as well as some internal diseases.  If the itching is affecting just one area of the body, it is most likely due to a common skin problem, such as:  Poison oak and poison ivy.  Contact dermatitis (skin irritation from a plant, chemicals, fiberglass, detergents, new cosmetic, new jewelry, or other substance).  Fungus (such as athlete's foot, jock itch, or ringworm).  Head lice  Dandruff  Insect bite  Infection (such as Shingles or other virus infections). If the itching is all over (widespread), the possible causes are many. These include:   Dry skin or eczema  Heat rash  Hives  Liver disorders  Kidney disorders TREATMENT  Localized itching   Lubrication of the skin. Use an ointment or cream or other unperfumed moisturizers if the skin is dry. Apply frequently, especially after bathing.  Anti-itch medicines. These medications may help control the urge to scratch. Scratching always makes itching worse and increases the chance of getting an infection.  Cortisone creams and ointments. These help reduce the inflammation.  Antibiotics. Skin infections can cause itching. Topical or oral antibiotics may be needed for 10 to 20 days to get rid of an infection. If you can identify what caused the itching, avoid this substance in the future.  Widespread itching  The following measures may help to relieve itching regardless of the cause:   Wash the skin once with soap to remove irritants.  Bathe in tepid water with baking soda, cornstarch, or oatmeal.  Use calamine lotion (nonprescription) or a baking soda solution (1 teaspoon in 4 ounces of water on the skin).  Apply 1% hydrocortisone cream (no prescription needed). Do not use this if there might be a skin infection.  Avoid scratching.  Avoid itchy or tight-fitting clothes.  Avoid excessive heat, sweating,  scented soaps, and swimming pools.  The lubricants, anti-itch medicines, etc. noted above may be helpful for controlling symptoms. SEEK MEDICAL CARE IF:   The itching becomes severe.  Your itch is not better after 1 week of treatment. Contact your caregiver to schedule further evaluation. Document Released: 06/05/2005 Document Revised: 08/28/2011 Document Reviewed: 11/23/2006 [facility] Patient Information 2014 [place].

## 2013-03-28 NOTE — Addendum Note (Signed)
Addended by: Estell Harpin T on: 03/28/2013 02:56 PM   Modules accepted: Orders

## 2013-04-01 ENCOUNTER — Other Ambulatory Visit (HOSPITAL_COMMUNITY): Payer: Self-pay | Admitting: Internal Medicine

## 2013-04-01 ENCOUNTER — Ambulatory Visit (HOSPITAL_COMMUNITY): Payer: Medicare Other | Attending: Internal Medicine | Admitting: Radiology

## 2013-04-01 ENCOUNTER — Other Ambulatory Visit (HOSPITAL_COMMUNITY): Payer: Self-pay | Admitting: Radiology

## 2013-04-01 DIAGNOSIS — Z87891 Personal history of nicotine dependence: Secondary | ICD-10-CM | POA: Insufficient documentation

## 2013-04-01 DIAGNOSIS — E039 Hypothyroidism, unspecified: Secondary | ICD-10-CM | POA: Insufficient documentation

## 2013-04-01 DIAGNOSIS — E785 Hyperlipidemia, unspecified: Secondary | ICD-10-CM | POA: Insufficient documentation

## 2013-04-01 DIAGNOSIS — R0602 Shortness of breath: Secondary | ICD-10-CM

## 2013-04-01 DIAGNOSIS — K219 Gastro-esophageal reflux disease without esophagitis: Secondary | ICD-10-CM | POA: Insufficient documentation

## 2013-04-01 DIAGNOSIS — R5381 Other malaise: Secondary | ICD-10-CM | POA: Diagnosis not present

## 2013-04-01 DIAGNOSIS — I10 Essential (primary) hypertension: Secondary | ICD-10-CM

## 2013-04-01 DIAGNOSIS — R0600 Dyspnea, unspecified: Secondary | ICD-10-CM

## 2013-04-01 NOTE — Progress Notes (Signed)
Echocardiogram performed.  

## 2013-04-03 ENCOUNTER — Encounter: Payer: Self-pay | Admitting: Internal Medicine

## 2013-04-09 ENCOUNTER — Ambulatory Visit (INDEPENDENT_AMBULATORY_CARE_PROVIDER_SITE_OTHER): Payer: Medicare Other | Admitting: Cardiovascular Disease

## 2013-04-09 ENCOUNTER — Encounter: Payer: Self-pay | Admitting: Cardiovascular Disease

## 2013-04-09 VITALS — BP 130/66 | HR 85 | Ht 65.0 in | Wt 285.0 lb

## 2013-04-09 DIAGNOSIS — R0601 Orthopnea: Secondary | ICD-10-CM | POA: Insufficient documentation

## 2013-04-09 DIAGNOSIS — R0609 Other forms of dyspnea: Secondary | ICD-10-CM | POA: Diagnosis not present

## 2013-04-09 DIAGNOSIS — R0600 Dyspnea, unspecified: Secondary | ICD-10-CM

## 2013-04-09 DIAGNOSIS — I10 Essential (primary) hypertension: Secondary | ICD-10-CM | POA: Diagnosis not present

## 2013-04-09 DIAGNOSIS — E785 Hyperlipidemia, unspecified: Secondary | ICD-10-CM

## 2013-04-09 NOTE — Assessment & Plan Note (Signed)
Well controlled.  Continue current medications and low sodium Dash type diet.    

## 2013-04-09 NOTE — Patient Instructions (Signed)
Your physician recommends that you schedule a follow-up appointment in: AS NEEDED  Your physician recommends that you continue on your current medications as directed. Please refer to the Current Medication list given to you today.  

## 2013-04-09 NOTE — Progress Notes (Signed)
Patient ID: Tamara Morales, female   DOB: [birth year], 79 y.o.   MRN: [id number] 79 yo patient seen by Dr Jeffie Pollock in 2011  Seen for SSCP  Cath showed no CAD.  Referred by Dr Nigel Berthold  On chronic Rx for HTN   Echo done 04/01/13 for dyspnea was essentially normal  LDL on 9/12 was 126  Dyspnea is chronic Wears CPAP and oxygen at night. Recently seen by Dr Gwenette Greet and he indicated no acute issues with lungs Seen in ER with bronchitis.  Myovue done 6/14 showed no ischemia and EF 63%  ? Family history of Factor 5 Leiden.  Has had chronic LE edema with multiple negative duplex exams  No cough fever sputum Stopped smoking 40 years ago No chest pain   Study Conclusions  - Left ventricle: The cavity size was normal. Systolic function was normal. The estimated ejection fraction was in the range of 55% to 60%. Wall motion was normal; there were no regional wall motion abnormalities. - Atrial septum: No defect or patent foramen ovale was identified. - Pulmonary arteries: PA peak pressure: 40mm Hg (S).  ROS: Denies fever, malais, weight loss, blurry vision, decreased visual acuity, cough, sputum, SOB, hemoptysis, pleuritic pain, palpitaitons, heartburn, abdominal pain, melena, lower extremity edema, claudication, or rash.  All other systems reviewed and negative   General: Affect appropriate Obese white female  HEENT: normal Neck supple with no adenopathy JVP normal no bruits no thyromegaly Lungs clear with no wheezing and good diaphragmatic motion Heart:  S1/S2 no murmur,rub, gallop or click PMI normal Abdomen: benighn, BS positve, no tenderness, no AAA no bruit.  No HSM or HJR Distal pulses intact with no bruits Plus 2 bilateral  edema Neuro non-focal Skin warm and dry No muscular weakness  Medications Current Outpatient Prescriptions  Medication Sig Dispense Refill  . albuterol-ipratropium (COMBIVENT) 18-103 MCG/ACT inhaler Inhale 2 puffs into the lungs every 6 (six) hours as needed for  wheezing (SOB).      [name] aspirin 81 MG tablet Take 81 mg by mouth daily.        . citalopram (CELEXA) 40 MG tablet Take 1 tablet (40 mg total) by mouth daily.  30 tablet  5  . FIBER PO Take 1 tablet by mouth daily.      [name] levothyroxine (SYNTHROID, LEVOTHROID) 137 MCG tablet Take 137 mcg by mouth daily before breakfast.      . loratadine (CLARITIN) 10 MG tablet Take 10 mg by mouth daily.      . Olmesartan-Amlodipine-HCTZ (TRIBENZOR) 20-5-12.5 MG TABS Take 1 tablet by mouth daily.  30 tablet  11  . Omega-3 Fatty Acids (FISH OIL PO) Take 1 capsule by mouth daily.      . pantoprazole (PROTONIX) 20 MG tablet Take 1 tablet (20 mg total) by mouth daily.  30 tablet  5  . phenazopyridine (AZO-TABS) 95 MG tablet Take 95 mg by mouth daily as needed (for urinary pain).       . potassium chloride (K-DUR,KLOR-CON) 10 MEQ tablet Take 2 tablets (20 mEq total) by mouth daily.  60 tablet  5  . predniSONE (DELTASONE) 10 MG tablet Take 3 tablet on days 1-2, take 2 tablets on days 3-4, take 1 tablet on days 5-6  12 tablet  0   No current facility-administered medications for this visit.    Allergies Ciprofloxacin; Codeine; Epinephrine; Oxycodone; Paxil; Propoxyphene hcl; and Meloxicam  Family History: Family History  Problem Relation Age of Onset  . Emphysema Sister   .  Coronary artery disease Neg Hx     Social History: History   Social History  . Marital Status: Married    Spouse Name: N/A    Number of Children: N/A  . Years of Education: N/A   Occupational History  . RETIRED     worked on office   Social History Main Topics  . Smoking status: Former Smoker -- 0.25 packs/day for 2 years    Types: Cigarettes    Quit date: 06/19/1974  . Smokeless tobacco: Never Used     Comment: Quit in late 20's  . Alcohol Use: No     Comment: rare  . Drug Use: No  . Sexual Activity: Not on file   Other Topics Concern  . Not on file   Social History Narrative  . No narrative on file     Electrocardiogram: 03/13/13  SR rate 67 normal ECG   Assessment and Plan

## 2013-04-09 NOTE — Assessment & Plan Note (Signed)
Cholesterol is at goal.  Continue current dose of statin and diet Rx.  No myalgias or side effects.  F/U  LFT's in 6 months. Lab Results  Component Value Date   LDLCALC 126* 02/28/2013

## 2013-04-09 NOTE — Assessment & Plan Note (Signed)
Seems to be related to obesity and chronic lung issues with CPAP.  Normla cardiac exam, myovue ECG and echo No need for further w/u  Episodes of waking up dyspnic may be panic attacks or related to her sleep apnea.  Consider xanax at night

## 2013-05-05 ENCOUNTER — Ambulatory Visit: Payer: Medicare Other | Admitting: Pulmonary Disease

## 2013-05-08 ENCOUNTER — Other Ambulatory Visit: Payer: Self-pay | Admitting: Internal Medicine

## 2013-05-21 ENCOUNTER — Ambulatory Visit (INDEPENDENT_AMBULATORY_CARE_PROVIDER_SITE_OTHER): Payer: Medicare Other | Admitting: Internal Medicine

## 2013-05-21 ENCOUNTER — Other Ambulatory Visit (INDEPENDENT_AMBULATORY_CARE_PROVIDER_SITE_OTHER): Payer: Medicare Other

## 2013-05-21 ENCOUNTER — Encounter: Payer: Self-pay | Admitting: Internal Medicine

## 2013-05-21 VITALS — BP 130/84 | HR 88 | Temp 98.2°F | Wt 294.0 lb

## 2013-05-21 DIAGNOSIS — R1032 Left lower quadrant pain: Secondary | ICD-10-CM | POA: Diagnosis not present

## 2013-05-21 DIAGNOSIS — M545 Low back pain, unspecified: Secondary | ICD-10-CM | POA: Diagnosis not present

## 2013-05-21 DIAGNOSIS — B379 Candidiasis, unspecified: Secondary | ICD-10-CM

## 2013-05-21 DIAGNOSIS — R7309 Other abnormal glucose: Secondary | ICD-10-CM

## 2013-05-21 DIAGNOSIS — N200 Calculus of kidney: Secondary | ICD-10-CM | POA: Diagnosis not present

## 2013-05-21 DIAGNOSIS — R739 Hyperglycemia, unspecified: Secondary | ICD-10-CM

## 2013-05-21 LAB — HEMOGLOBIN A1C: Hgb A1c MFr Bld: 6.2 % (ref 4.6–6.5)

## 2013-05-21 MED ORDER — NYSTATIN 100000 UNIT/GM EX POWD: 1.0000 g | Freq: Two times a day (BID) | CUTANEOUS | Status: DC

## 2013-05-21 NOTE — Progress Notes (Signed)
Pre-visit discussion using our clinic review tool. No additional management support is needed unless otherwise documented below in the visit note.  

## 2013-05-21 NOTE — Patient Instructions (Signed)
It was good to see you today.  We have reviewed your prior records including labs and tests today  Test(s) ordered today. Your results will be released to [place] (or called to you) after review, usually within 72hours after test completion. If any changes need to be made, you will be notified at that same time.  we'll make referral for MRI back and to back specialist to evaluate your left hip pain. Our office will contact you regarding appointment(s) once made.  Please schedule followup in 6 months, call sooner if problems.

## 2013-05-21 NOTE — Progress Notes (Signed)
  Subjective:    Patient ID: Tamara Morales, female    DOB: Oct 14, 1930, 79 y.o.   MRN: [id number]  HPI  Here for follow up - reviewed chronic medical issues and interval medical events:   Past Medical History  Diagnosis Date  . Morbid obesity   . MIGRAINE HEADACHE   . ARTHRITIS, KNEES, BILATERAL   . DEPRESSION   . GERD   . HYPERLIPIDEMIA   . HYPERTENSION   . Breast CA 2000    s/p R lumpectomy and XRT  . DJD (degenerative joint disease) of knee   . Urge incontinence   . HYPOTHYROIDISM     postsurgical  . OSA (obstructive sleep apnea) 01/17/2011 dx    Review of Systems  Cardiovascular: Negative for palpitations and leg swelling.  Gastrointestinal: Negative for nausea, vomiting and diarrhea.  Musculoskeletal: Positive for arthralgias, back pain, gait problem and myalgias. Negative for joint swelling.  Skin: Positive for rash (yeast). Negative for wound.  Neurological: Positive for weakness (L hip -climbing or rising from seat). Negative for numbness and headaches.       Objective:   Physical Exam  BP 130/84  Pulse 88  Temp(Src) 98.2 F (36.8 C) (Oral)  Wt 294 lb (133.358 kg)  SpO2 96% Wt Readings from Last 3 Encounters:  05/21/13 294 lb (133.358 kg)  04/09/13 285 lb (129.275 kg)  03/28/13 292 lb (132.45 kg)   Constitutional: She is obese; appears well-developed and well-nourished.  Neck: Normal range of motion. Neck supple. No JVD present. No thyromegaly present.  Cardiovascular: Normal rate, regular rhythm and normal heart sounds.  No murmur heard. trace edema BLE. Pulmonary/Chest: Effort normal. breath sounds normal without rhonchi or wheeze.  Abdomen:  obese, SNTND, +BS - no r/g Skin - candida changes pannus fold MSkel: Back: full range of motion of thoracic and lumbar spine. Non tender to palpation. Positive ipsilataeral straight leg raise on L. DTR's are symmetrically intact. Sensation intact in all dermatomes of the lower extremities. Diminished L hip  flexor due to pain. patient is able to heel toe walk without difficulty but ambulates with antalgic gait. Psychiatric: She has a normal mood and affect. Her behavior is normal. Judgment and thought content normal.   Lab Results  Component Value Date   WBC 9.8 03/19/2013   HGB 14.1 03/19/2013   HCT 41.4 03/19/2013   PLT 171 03/19/2013   CHOL 196 02/28/2013   TRIG 148.0 02/28/2013   HDL 40.60 02/28/2013   LDLDIRECT 99.4 03/13/2012   ALT 11 03/19/2013   AST 15 03/19/2013   NA 137 03/19/2013   K 3.6 03/19/2013   CL 99 03/19/2013   CREATININE 1.01 03/19/2013   BUN 15 03/19/2013   CO2 28 03/19/2013   TSH 4.33 02/28/2013   INR 1.02 03/20/2010   HGBA1C 5.9 06/29/2011   Lab Results  Component Value Date   CALCIUM 8.4 03/19/2013   CAION 1.08* 07/30/2011        Assessment & Plan:   See problem list. Medications and labs reviewed today.  L "groin" pain with flexor weakness/pain - suspect lumbar radicular source given back pain - check MRI and refer to back/spine specialist  Candida - Nystatin powder and check a1c  Kidney stone 03/2013 - hx parathyroid adenomas but normal Ca - check PTH now

## 2013-05-23 ENCOUNTER — Encounter: Payer: Self-pay | Admitting: Internal Medicine

## 2013-05-26 ENCOUNTER — Ambulatory Visit (INDEPENDENT_AMBULATORY_CARE_PROVIDER_SITE_OTHER): Payer: Medicare Other | Admitting: Pulmonary Disease

## 2013-05-26 ENCOUNTER — Telehealth: Payer: Self-pay | Admitting: Pulmonary Disease

## 2013-05-26 ENCOUNTER — Encounter: Payer: Self-pay | Admitting: Pulmonary Disease

## 2013-05-26 VITALS — BP 126/88 | HR 82 | Temp 96.8°F | Ht 64.0 in | Wt 296.6 lb

## 2013-05-26 DIAGNOSIS — G4739 Other sleep apnea: Secondary | ICD-10-CM | POA: Diagnosis not present

## 2013-05-26 DIAGNOSIS — J3489 Other specified disorders of nose and nasal sinuses: Secondary | ICD-10-CM | POA: Diagnosis not present

## 2013-05-26 DIAGNOSIS — G4733 Obstructive sleep apnea (adult) (pediatric): Secondary | ICD-10-CM

## 2013-05-26 DIAGNOSIS — R0981 Nasal congestion: Secondary | ICD-10-CM

## 2013-05-26 DIAGNOSIS — IMO0002 Reserved for concepts with insufficient information to code with codable children: Secondary | ICD-10-CM

## 2013-05-26 DIAGNOSIS — G4736 Sleep related hypoventilation in conditions classified elsewhere: Secondary | ICD-10-CM

## 2013-05-26 NOTE — Patient Instructions (Signed)
Use saline rinse when you have nasal congestion as needed Can try using Nasacort 1 to 2 sprays in each nostril as needed for sinus congestion >> you can get this without a prescription Will call with results of overnight oxygen test Follow up in 1 year

## 2013-05-26 NOTE — Progress Notes (Signed)
Chief Complaint  Patient presents with  . Follow-up    Wearing cpap every night for approx 7 hours each night.  No problems with mask or pressure at this time.    History of Present Illness: Tamara Morales is a 79 y.o. female with severe OSA using CPAP and 2 liters oxygen.  She has been doing well with CPAP and oxygen at night.  She gets about 7 to 7.5 hours of sleep per night.  She has a full face mask, and this fits well.  She feels rested during the day.  She has noticed problems with nasal congestion.  She was recently in a rehab facility, and while there her oxygen was increased to 3 liters >> she is not sure why this was done.  TESTS: PSG 01/22/11 >> AHI 74.3, SpO2 low 76%. ONO with RA and CPAP 04/21/11 >> Test time 5 hr 12 min. Basal SpO2 87%, low SpO2 73%. Spent 161 min with SpO2 < 88%. CPAP 05/11/12 to 08/08/12 >> used on 89 of 90 nights with average 4 hrs 38 min.  Average AHI 6.7 with CPAP 10 cm H2O. CPAP 10/11/12 to 01/08/13 >> Used on 88 of 90 nights with average 5 hrs 15 min. Average AHI 5.3 with CPAP 11 cm H2O.  Tamara Morales  has a past medical history of Morbid obesity; MIGRAINE HEADACHE; ARTHRITIS, KNEES, BILATERAL; DEPRESSION; GERD; HYPERLIPIDEMIA; HYPERTENSION; Breast CA (2000); DJD (degenerative joint disease) of knee; Urge incontinence; HYPOTHYROIDISM; and OSA (obstructive sleep apnea) (01/17/2011 dx).  Tamara Morales  has past surgical history that includes Cholecystectomy (1995); Breast lumpectomy (2000); Thyroidectomy; Breast biopsy (2000); Replacement total knee bilateral (1999, 2005); Tubal ligation; Parathyroidectomy; Tonsillectomy; Adenoidectomy; and Right leg femur repaired.  Prior to Admission medications   Medication Sig Start Date End Date Taking? Authorizing Provider  acetaminophen (TYLENOL) 500 MG tablet Take 500 mg by mouth as needed.   Yes Historical Provider, MD  albuterol-ipratropium (COMBIVENT) 18-103 MCG/ACT inhaler Inhale 2 puffs into the  lungs every 6 (six) hours as needed for wheezing. 07/30/11 09/10/12 Yes April K Palumbo-Rasch, MD  aspirin 81 MG tablet Take 81 mg by mouth daily.     Yes Historical Provider, MD  citalopram (CELEXA) 10 MG tablet  04/24/12  Yes Rowe Clack, MD  desloratadine (CLARINEX) 5 MG tablet Take 5 mg by mouth daily as needed. 07/30/11 09/10/12 Yes April K Palumbo-Rasch, MD  fluconazole (DIFLUCAN) 150 MG tablet Take 150 mg by mouth as needed. 04/17/12  Yes Evie Lacks Plotnikov, MD  furosemide (LASIX) 20 MG tablet Take 1 tablet (20 mg total) by mouth daily as needed (swelling). 04/17/12 04/17/14 Yes Cassandria Anger, MD  isometheptene-acetaminophen-dichloralphenazone (MIDRIN) 65-325-100 MG capsule TAKE ONE CAPSULE BY MOUTH EVERY 4 HOURS AS NEEDED 08/03/12  Yes Rowe Clack, MD  KLOR-CON 10 10 MEQ tablet TAKE 2 TABLETS (20 MEQ TOTAL) BY MOUTH DAILY 11/30/11  Yes Rowe Clack, MD  Olmesartan-Amlodipine-HCTZ (TRIBENZOR) 20-5-12.5 MG TABS Take 1 tablet by mouth daily. 04/17/12  Yes Evie Lacks Plotnikov, MD  pantoprazole (PROTONIX) 20 MG tablet Take 1 tablet (20 mg total) by mouth daily. 05/13/12  Yes Rowe Clack, MD  phenazopyridine (AZO-TABS) 95 MG tablet Take 95 mg by mouth daily.     Yes Historical Provider, MD  SYNTHROID 137 MCG tablet TAKE 1 BY MOUTH ONCE DAILY 05/21/12  Yes Rowe Clack, MD    Allergies  Allergen Reactions  . Ciprofloxacin     REACTION: edgy  .  Codeine     REACTION: Nausea$RemoveBeforeDE' \\T'[id number]$ \ vomitting  . Epinephrine     REACTION: rapid pulse, sweats  . Oxycodone Other (See Comments)    Patient felt like it altered her mental status "Crazy" . Hallucinations later as well.  [name] Paxil [Paroxetine Hydrochloride] Other (See Comments)    Sever depression   . Propoxyphene Hcl     REACTION: Nausea$RemoveBeforeDE' \\T'[id number]$ \ vomitting  . Meloxicam Diarrhea     Physical Exam:  General - No distress ENT - No sinus tenderness, no oral exudate, no LAN Cardiac - s1s2 regular, no murmur Chest - No  wheeze/rales/dullness Back - No focal tenderness Abd - Soft, non-tender Ext - No edema Neuro - Normal strength Skin - No rashes Psych - normal mood, and behavior   Assessment/Plan:  Chesley Mires, MD [place] [facility] Pager:  [phone] 05/26/2013, 9:35 AM

## 2013-05-26 NOTE — Telephone Encounter (Signed)
lmomtcb x1 for pt 

## 2013-05-27 ENCOUNTER — Telehealth: Payer: Self-pay | Admitting: Pulmonary Disease

## 2013-05-27 DIAGNOSIS — R0981 Nasal congestion: Secondary | ICD-10-CM | POA: Insufficient documentation

## 2013-05-27 NOTE — Telephone Encounter (Signed)
Spoke with the pt and she forgot to ask for an order to be sent to [facility] for supplies for her cpap and oxygen. Order placed. Esmeralda Bing, CMA

## 2013-05-27 NOTE — Telephone Encounter (Signed)
Called, spoke with pt.  Reports she has spoken with [facility] since leaving this msg.  [facility] now has what is needed.  Nothing further needed per pt.

## 2013-05-27 NOTE — Assessment & Plan Note (Signed)
She recently had her oxygen increased to 3 liters at night for unclear reasons.  Will have her decrease her oxygen back to 2 liters at night, and arrange for overnight oximetry on 2 liters with CPAP.

## 2013-05-27 NOTE — Assessment & Plan Note (Signed)
She is compliant with therapy and reports benefit from CPAP.  She is to continue CPAP with oxygen at night.

## 2013-05-27 NOTE — Assessment & Plan Note (Signed)
Advised that she should use nasal irrigation and can try OTC nasacort prn.

## 2013-05-28 ENCOUNTER — Encounter: Payer: Self-pay | Admitting: Internal Medicine

## 2013-05-30 ENCOUNTER — Inpatient Hospital Stay: Admission: RE | Admit: 2013-05-30 | Payer: Medicare Other | Source: Ambulatory Visit

## 2013-06-02 ENCOUNTER — Ambulatory Visit
Admission: RE | Admit: 2013-06-02 | Discharge: 2013-06-02 | Disposition: A | Discharge status: Home / Self Care | Payer: Medicare Other | Source: Ambulatory Visit | Attending: Internal Medicine | Admitting: Internal Medicine

## 2013-06-02 DIAGNOSIS — M47817 Spondylosis without myelopathy or radiculopathy, lumbosacral region: Secondary | ICD-10-CM | POA: Diagnosis not present

## 2013-06-02 DIAGNOSIS — M4120 Other idiopathic scoliosis, site unspecified: Secondary | ICD-10-CM | POA: Diagnosis not present

## 2013-06-02 DIAGNOSIS — R1032 Left lower quadrant pain: Secondary | ICD-10-CM

## 2013-06-02 DIAGNOSIS — M545 Low back pain, unspecified: Secondary | ICD-10-CM

## 2013-06-03 ENCOUNTER — Encounter: Payer: Self-pay | Admitting: Internal Medicine

## 2013-06-04 ENCOUNTER — Other Ambulatory Visit: Payer: Medicare Other

## 2013-06-15 ENCOUNTER — Encounter: Payer: Self-pay | Admitting: Internal Medicine

## 2013-06-16 NOTE — Telephone Encounter (Signed)
Please contact patient to find out details of requested pharmacy Then okay to send syrup: promethazine DM to pharmacy: 84ml q4h prn #[phone], no refills thanks

## 2013-06-21 ENCOUNTER — Other Ambulatory Visit: Payer: Self-pay | Admitting: Internal Medicine

## 2013-06-21 ENCOUNTER — Encounter: Payer: Self-pay | Admitting: Internal Medicine

## 2013-07-24 ENCOUNTER — Encounter: Payer: Self-pay | Admitting: Internal Medicine

## 2013-07-27 ENCOUNTER — Other Ambulatory Visit: Payer: Self-pay | Admitting: Internal Medicine

## 2013-08-01 ENCOUNTER — Encounter: Payer: Self-pay | Admitting: Internal Medicine

## 2013-08-04 MED ORDER — ESOMEPRAZOLE MAGNESIUM 40 MG PO CPDR: 40.0000 mg | DELAYED_RELEASE_CAPSULE | Freq: Every day | ORAL | Status: DC

## 2013-08-05 ENCOUNTER — Encounter: Payer: Self-pay | Admitting: Internal Medicine

## 2013-08-07 DIAGNOSIS — M779 Enthesopathy, unspecified: Secondary | ICD-10-CM | POA: Diagnosis not present

## 2013-08-07 DIAGNOSIS — M2010 Hallux valgus (acquired), unspecified foot: Secondary | ICD-10-CM | POA: Diagnosis not present

## 2013-08-07 DIAGNOSIS — M79609 Pain in unspecified limb: Secondary | ICD-10-CM | POA: Diagnosis not present

## 2013-08-07 DIAGNOSIS — G5760 Lesion of plantar nerve, unspecified lower limb: Secondary | ICD-10-CM | POA: Diagnosis not present

## 2013-08-15 ENCOUNTER — Telehealth: Payer: Self-pay | Admitting: Internal Medicine

## 2013-08-15 DIAGNOSIS — K529 Noninfective gastroenteritis and colitis, unspecified: Secondary | ICD-10-CM

## 2013-08-15 NOTE — Telephone Encounter (Signed)
Patient Information:  Caller Name: Ramla  Phone: [phone]  Patient: Tamara Morales, Tamara Morales  Gender: Female  DOB: 07/19/1930  Age: 80 Years  PCP: Gwendolyn Grant (Adults only)  Office Follow Up:  Does the office need to follow up with this patient?: Yes  Instructions For The Office: Home care advice and call back parameters reviwed. Please follow up.  RN Note:  Home care advice and call back parameters reviwed. Please follow up.  Symptoms  Reason For Call & Symptoms: Patient states that she began with Diarrhea on Monday 08/11/13. She treated her self with Lomotil. She describes 2 stools daily described as liquid. No vomiting, Afebrile. she states if she eats anything diarrhea "starts back up again".  Reviewed Health History In EMR: Yes  Reviewed Medications In EMR: Yes  Reviewed Allergies In EMR: Yes  Reviewed Surgeries / Procedures: Yes  Date of Onset of Symptoms: 08/11/2013  Treatments Tried: Lomotil. fluids  Treatments Tried Worked: No  Guideline(s) Used:  Diarrhea  Disposition Per Guideline:   Callback by PCP Today  Reason For Disposition Reached:   Age > 12 years  Advice Given:  Reassurance:  In healthy adults, new-onset diarrhea is usually caused by a viral infection of the intestines, which you can treat at home. Diarrhea is the body's way of getting rid of the infection. Here are some tips on how to keep ahead of the fluid losses.  Fluids:  Drink more fluids, at least 8-10 glasses (8 oz or 240 ml) daily.  For example: sports drinks, diluted fruit juices, soft drinks.  Supplement this with saltine crackers or soups to make certain that you are getting sufficient fluid and salt to meet your body's needs.  Avoid caffeinated beverages (Reason: caffeine is mildly dehydrating).  Nutrition:  Maintaining some food intake during episodes of diarrhea is important.  Ideal initial foods include boiled starches/cereals (e.g., potatoes, rice, noodles, wheat, oats) with a  small amount of salt to taste.  Other acceptable foods include: bananas, yogurt, crackers, soup.  As your stools return to normal consistency, resume a normal diet.  Expected Course:  Viral diarrhea lasts 4-7 days. Always worse on days 1 and 2.  Call Back If:  Signs of dehydration occur (e.g., no urine for more than 12 hours, very dry mouth, lightheaded, etc.)  Diarrhea lasts over 7 days  You become worse.  RN Overrode Recommendation:  Follow Up With Office Later  Home care advice and call back parameters reviwed. Please follow up.

## 2013-08-18 NOTE — Telephone Encounter (Signed)
I advise eval by GI - I will make referral thanks

## 2013-08-18 NOTE — Telephone Encounter (Signed)
Notified pt with md response. Pt states they GI dept call her this am, but she told them that she was fine now didn't need referral.../lmb

## 2013-08-21 ENCOUNTER — Inpatient Hospital Stay (HOSPITAL_COMMUNITY)
Admission: EM | Admit: 2013-08-21 | Discharge: 2013-08-23 | DRG: 202 | Disposition: A | Discharge status: Home / Self Care | Payer: Medicare Other | Attending: Internal Medicine | Admitting: Internal Medicine

## 2013-08-21 ENCOUNTER — Encounter (HOSPITAL_COMMUNITY): Payer: Self-pay | Admitting: Emergency Medicine

## 2013-08-21 ENCOUNTER — Inpatient Hospital Stay (HOSPITAL_COMMUNITY): Payer: Medicare Other

## 2013-08-21 ENCOUNTER — Emergency Department (HOSPITAL_COMMUNITY): Payer: Medicare Other

## 2013-08-21 DIAGNOSIS — Z87891 Personal history of nicotine dependence: Secondary | ICD-10-CM

## 2013-08-21 DIAGNOSIS — K219 Gastro-esophageal reflux disease without esophagitis: Secondary | ICD-10-CM | POA: Diagnosis present

## 2013-08-21 DIAGNOSIS — J9600 Acute respiratory failure, unspecified whether with hypoxia or hypercapnia: Secondary | ICD-10-CM | POA: Diagnosis present

## 2013-08-21 DIAGNOSIS — G43909 Migraine, unspecified, not intractable, without status migrainosus: Secondary | ICD-10-CM | POA: Diagnosis present

## 2013-08-21 DIAGNOSIS — IMO0002 Reserved for concepts with insufficient information to code with codable children: Secondary | ICD-10-CM

## 2013-08-21 DIAGNOSIS — E785 Hyperlipidemia, unspecified: Secondary | ICD-10-CM | POA: Diagnosis present

## 2013-08-21 DIAGNOSIS — I10 Essential (primary) hypertension: Secondary | ICD-10-CM | POA: Diagnosis present

## 2013-08-21 DIAGNOSIS — R197 Diarrhea, unspecified: Secondary | ICD-10-CM | POA: Diagnosis not present

## 2013-08-21 DIAGNOSIS — M129 Arthropathy, unspecified: Secondary | ICD-10-CM | POA: Diagnosis not present

## 2013-08-21 DIAGNOSIS — R112 Nausea with vomiting, unspecified: Secondary | ICD-10-CM | POA: Diagnosis not present

## 2013-08-21 DIAGNOSIS — E039 Hypothyroidism, unspecified: Secondary | ICD-10-CM | POA: Diagnosis present

## 2013-08-21 DIAGNOSIS — R0609 Other forms of dyspnea: Secondary | ICD-10-CM | POA: Diagnosis not present

## 2013-08-21 DIAGNOSIS — I700 Atherosclerosis of aorta: Secondary | ICD-10-CM | POA: Diagnosis not present

## 2013-08-21 DIAGNOSIS — Z853 Personal history of malignant neoplasm of breast: Secondary | ICD-10-CM | POA: Diagnosis not present

## 2013-08-21 DIAGNOSIS — R109 Unspecified abdominal pain: Secondary | ICD-10-CM | POA: Diagnosis not present

## 2013-08-21 DIAGNOSIS — J209 Acute bronchitis, unspecified: Secondary | ICD-10-CM | POA: Diagnosis not present

## 2013-08-21 DIAGNOSIS — Z79899 Other long term (current) drug therapy: Secondary | ICD-10-CM | POA: Diagnosis not present

## 2013-08-21 DIAGNOSIS — Z96649 Presence of unspecified artificial hip joint: Secondary | ICD-10-CM

## 2013-08-21 DIAGNOSIS — R531 Weakness: Secondary | ICD-10-CM

## 2013-08-21 DIAGNOSIS — G4739 Other sleep apnea: Secondary | ICD-10-CM

## 2013-08-21 DIAGNOSIS — Z7982 Long term (current) use of aspirin: Secondary | ICD-10-CM | POA: Diagnosis not present

## 2013-08-21 DIAGNOSIS — R32 Unspecified urinary incontinence: Secondary | ICD-10-CM | POA: Diagnosis present

## 2013-08-21 DIAGNOSIS — F410 Panic disorder [episodic paroxysmal anxiety] without agoraphobia: Secondary | ICD-10-CM

## 2013-08-21 DIAGNOSIS — E890 Postprocedural hypothyroidism: Secondary | ICD-10-CM | POA: Diagnosis present

## 2013-08-21 DIAGNOSIS — G4736 Sleep related hypoventilation in conditions classified elsewhere: Secondary | ICD-10-CM

## 2013-08-21 DIAGNOSIS — E876 Hypokalemia: Secondary | ICD-10-CM | POA: Diagnosis not present

## 2013-08-21 DIAGNOSIS — F3289 Other specified depressive episodes: Secondary | ICD-10-CM

## 2013-08-21 DIAGNOSIS — R0989 Other specified symptoms and signs involving the circulatory and respiratory systems: Secondary | ICD-10-CM | POA: Diagnosis not present

## 2013-08-21 DIAGNOSIS — G4733 Obstructive sleep apnea (adult) (pediatric): Secondary | ICD-10-CM

## 2013-08-21 DIAGNOSIS — R5381 Other malaise: Secondary | ICD-10-CM | POA: Diagnosis not present

## 2013-08-21 DIAGNOSIS — Z888 Allergy status to other drugs, medicaments and biological substances status: Secondary | ICD-10-CM

## 2013-08-21 DIAGNOSIS — Z87898 Personal history of other specified conditions: Secondary | ICD-10-CM

## 2013-08-21 DIAGNOSIS — R5383 Other fatigue: Secondary | ICD-10-CM | POA: Diagnosis not present

## 2013-08-21 DIAGNOSIS — F329 Major depressive disorder, single episode, unspecified: Secondary | ICD-10-CM | POA: Diagnosis present

## 2013-08-21 LAB — POC OCCULT BLOOD, ED: Fecal Occult Bld: NEGATIVE

## 2013-08-21 LAB — BASIC METABOLIC PANEL
BUN: 12 mg/dL (ref 6–23)
CO2: 22 meq/L (ref 19–32)
Calcium: 8.5 mg/dL (ref 8.4–10.5)
Chloride: 99 mEq/L (ref 96–112)
Creatinine, Ser: 0.98 mg/dL (ref 0.50–1.10)
GFR calc Af Amer: 61 mL/min — ABNORMAL LOW (ref >90)
GFR calc non Af Amer: 53 mL/min — ABNORMAL LOW (ref >90)
Glucose, Bld: 220 mg/dL — ABNORMAL HIGH (ref 70–99)
POTASSIUM: 3.6 meq/L — AB (ref 3.7–5.3)
SODIUM: 140 meq/L (ref 137–147)

## 2013-08-21 LAB — CBC WITH DIFFERENTIAL/PLATELET
BASOS ABS: 0 10*3/uL (ref 0.0–0.1)
Basophils Relative: 1 % (ref 0–1)
Eosinophils Absolute: 0.1 10*3/uL (ref 0.0–0.7)
Eosinophils Relative: 1 % (ref 0–5)
HCT: 40.9 % (ref 36.0–46.0)
Hemoglobin: 14 g/dL (ref 12.0–15.0)
LYMPHS PCT: 28 % (ref 12–46)
Lymphs Abs: 1.4 10*3/uL (ref 0.7–4.0)
MCH: 31.3 pg (ref 26.0–34.0)
MCHC: 34.2 g/dL (ref 30.0–36.0)
MCV: 91.3 fL (ref 78.0–100.0)
Monocytes Absolute: 0.3 10*3/uL (ref 0.1–1.0)
Monocytes Relative: 6 % (ref 3–12)
NEUTROS ABS: 3.3 10*3/uL (ref 1.7–7.7)
NEUTROS PCT: 64 % (ref 43–77)
PLATELETS: 140 10*3/uL — AB (ref 150–400)
RBC: 4.48 MIL/uL (ref 3.87–5.11)
RDW: 13.3 % (ref 11.5–15.5)
WBC: 5.1 10*3/uL (ref 4.0–10.5)

## 2013-08-21 LAB — COMPREHENSIVE METABOLIC PANEL
ALBUMIN: 3.6 g/dL (ref 3.5–5.2)
ALT: 10 U/L (ref 0–35)
AST: 13 U/L (ref 0–37)
Alkaline Phosphatase: 80 U/L (ref 39–117)
BILIRUBIN TOTAL: 0.5 mg/dL (ref 0.3–1.2)
BUN: 13 mg/dL (ref 6–23)
CHLORIDE: 99 meq/L (ref 96–112)
CO2: 24 mEq/L (ref 19–32)
CREATININE: 0.86 mg/dL (ref 0.50–1.10)
Calcium: 8.8 mg/dL (ref 8.4–10.5)
GFR calc Af Amer: 72 mL/min — ABNORMAL LOW (ref >90)
GFR calc non Af Amer: 62 mL/min — ABNORMAL LOW (ref >90)
Glucose, Bld: 145 mg/dL — ABNORMAL HIGH (ref 70–99)
Potassium: 2.9 mEq/L — CL (ref 3.7–5.3)
SODIUM: 140 meq/L (ref 137–147)
Total Protein: 6.9 g/dL (ref 6.0–8.3)

## 2013-08-21 LAB — PRO B NATRIURETIC PEPTIDE: Pro B Natriuretic peptide (BNP): 106 pg/mL (ref 0–450)

## 2013-08-21 LAB — MAGNESIUM: Magnesium: 1.8 mg/dL (ref 1.5–2.5)

## 2013-08-21 LAB — PHOSPHORUS: PHOSPHORUS: 2.9 mg/dL (ref 2.3–4.6)

## 2013-08-21 MED ORDER — DEXTROSE 5 % IV SOLN
500.0000 mg | INTRAVENOUS | Status: DC
  Administered 2013-08-21: 500 mg via INTRAVENOUS
  Filled 2013-08-21: qty 500

## 2013-08-21 MED ORDER — LORATADINE 10 MG PO TABS
10.0000 mg | ORAL_TABLET | Freq: Every day | ORAL | Status: DC | PRN
  Filled 2013-08-21: qty 1

## 2013-08-21 MED ORDER — OLMESARTAN-AMLODIPINE-HCTZ 20-5-12.5 MG PO TABS: 1.0000 | ORAL_TABLET | Freq: Every day | ORAL | Status: DC

## 2013-08-21 MED ORDER — DIPHENHYDRAMINE HCL 25 MG PO CAPS
25.0000 mg | ORAL_CAPSULE | Freq: Four times a day (QID) | ORAL | Status: DC | PRN
  Administered 2013-08-21 – 2013-08-22 (×2): 25 mg via ORAL
  Filled 2013-08-21 (×2): qty 1

## 2013-08-21 MED ORDER — IPRATROPIUM-ALBUTEROL 0.5-2.5 (3) MG/3ML IN SOLN
3.0000 mL | RESPIRATORY_TRACT | Status: DC | PRN
  Administered 2013-08-21: 3 mL via RESPIRATORY_TRACT
  Filled 2013-08-21: qty 3

## 2013-08-21 MED ORDER — PANTOPRAZOLE SODIUM 20 MG PO TBEC
20.0000 mg | DELAYED_RELEASE_TABLET | Freq: Every day | ORAL | Status: DC
  Administered 2013-08-21 – 2013-08-23 (×3): 20 mg via ORAL
  Filled 2013-08-21 (×3): qty 1

## 2013-08-21 MED ORDER — LEVOTHYROXINE SODIUM 137 MCG PO TABS
137.0000 ug | ORAL_TABLET | Freq: Every day | ORAL | Status: DC
  Administered 2013-08-21 – 2013-08-23 (×3): 137 ug via ORAL
  Filled 2013-08-21 (×4): qty 1

## 2013-08-21 MED ORDER — HYDROCHLOROTHIAZIDE 12.5 MG PO CAPS
12.5000 mg | ORAL_CAPSULE | Freq: Every day | ORAL | Status: DC
  Administered 2013-08-21 – 2013-08-23 (×3): 12.5 mg via ORAL
  Filled 2013-08-21 (×3): qty 1

## 2013-08-21 MED ORDER — POTASSIUM CHLORIDE CRYS ER 20 MEQ PO TBCR
20.0000 meq | EXTENDED_RELEASE_TABLET | Freq: Every day | ORAL | Status: DC
Start: 2013-08-21 — End: 2013-08-23
  Administered 2013-08-21 – 2013-08-23 (×3): 20 meq via ORAL
  Filled 2013-08-21 (×3): qty 1

## 2013-08-21 MED ORDER — SODIUM CHLORIDE 0.9 % IV SOLN
INTRAVENOUS | Status: DC
Start: 2013-08-21 — End: 2013-08-21
  Administered 2013-08-21: 16:00:00 via INTRAVENOUS

## 2013-08-21 MED ORDER — LORAZEPAM 2 MG/ML IJ SOLN: 0.5000 mg | Freq: Two times a day (BID) | INTRAMUSCULAR | Status: DC | PRN

## 2013-08-21 MED ORDER — IOHEXOL 350 MG/ML SOLN
100.0000 mL | Freq: Once | INTRAVENOUS | Status: AC | PRN
  Administered 2013-08-21: 100 mL via INTRAVENOUS

## 2013-08-21 MED ORDER — ONDANSETRON HCL 4 MG PO TABS: 4.0000 mg | ORAL_TABLET | Freq: Four times a day (QID) | ORAL | Status: DC | PRN

## 2013-08-21 MED ORDER — IPRATROPIUM-ALBUTEROL 0.5-2.5 (3) MG/3ML IN SOLN: 3.0000 mL | Freq: Four times a day (QID) | RESPIRATORY_TRACT | Status: DC | PRN

## 2013-08-21 MED ORDER — ONDANSETRON HCL 4 MG/2ML IJ SOLN
4.0000 mg | Freq: Four times a day (QID) | INTRAMUSCULAR | Status: DC | PRN
  Administered 2013-08-22 (×2): 4 mg via INTRAVENOUS
  Filled 2013-08-21 (×2): qty 2

## 2013-08-21 MED ORDER — PREDNISONE 20 MG PO TABS
60.0000 mg | ORAL_TABLET | Freq: Once | ORAL | Status: AC
  Administered 2013-08-21: 60 mg via ORAL
  Filled 2013-08-21: qty 3

## 2013-08-21 MED ORDER — ENOXAPARIN SODIUM 60 MG/0.6ML ~~LOC~~ SOLN
60.0000 mg | SUBCUTANEOUS | Status: DC
  Administered 2013-08-21 – 2013-08-22 (×2): 60 mg via SUBCUTANEOUS
  Filled 2013-08-21 (×3): qty 0.6

## 2013-08-21 MED ORDER — SODIUM CHLORIDE 0.9 % IV SOLN: INTRAVENOUS | Status: AC

## 2013-08-21 MED ORDER — PHENAZOPYRIDINE HCL 100 MG PO TABS
95.0000 mg | ORAL_TABLET | Freq: Every day | ORAL | Status: DC | PRN
  Filled 2013-08-21: qty 1

## 2013-08-21 MED ORDER — AMOXICILLIN-POT CLAVULANATE 500-125 MG PO TABS
1.0000 | ORAL_TABLET | Freq: Three times a day (TID) | ORAL | Status: DC
  Administered 2013-08-21 – 2013-08-23 (×6): 500 mg via ORAL
  Filled 2013-08-21 (×9): qty 1

## 2013-08-21 MED ORDER — METHYLPREDNISOLONE SODIUM SUCC 125 MG IJ SOLR
60.0000 mg | Freq: Two times a day (BID) | INTRAMUSCULAR | Status: DC
  Administered 2013-08-21 – 2013-08-22 (×2): 60 mg via INTRAVENOUS
  Filled 2013-08-21 (×4): qty 0.96

## 2013-08-21 MED ORDER — IPRATROPIUM-ALBUTEROL 0.5-2.5 (3) MG/3ML IN SOLN: 3.0000 mL | RESPIRATORY_TRACT | Status: DC | PRN

## 2013-08-21 MED ORDER — ENOXAPARIN SODIUM 40 MG/0.4ML ~~LOC~~ SOLN: 40.0000 mg | SUBCUTANEOUS | Status: DC

## 2013-08-21 MED ORDER — ASPIRIN 81 MG PO CHEW
81.0000 mg | CHEWABLE_TABLET | Freq: Every day | ORAL | Status: DC
  Administered 2013-08-22 – 2013-08-23 (×2): 81 mg via ORAL
  Filled 2013-08-21 (×2): qty 1

## 2013-08-21 MED ORDER — IRBESARTAN 150 MG PO TABS
150.0000 mg | ORAL_TABLET | Freq: Every day | ORAL | Status: DC
  Administered 2013-08-21 – 2013-08-23 (×3): 150 mg via ORAL
  Filled 2013-08-21 (×3): qty 1

## 2013-08-21 MED ORDER — POTASSIUM CHLORIDE CRYS ER 20 MEQ PO TBCR
40.0000 meq | EXTENDED_RELEASE_TABLET | Freq: Once | ORAL | Status: AC
  Administered 2013-08-21: 40 meq via ORAL
  Filled 2013-08-21: qty 2

## 2013-08-21 MED ORDER — ASPIRIN 81 MG PO TABS: 81.0000 mg | ORAL_TABLET | Freq: Every day | ORAL | Status: DC

## 2013-08-21 MED ORDER — IPRATROPIUM-ALBUTEROL 0.5-2.5 (3) MG/3ML IN SOLN
3.0000 mL | Freq: Four times a day (QID) | RESPIRATORY_TRACT | Status: DC
  Administered 2013-08-21 – 2013-08-22 (×4): 3 mL via RESPIRATORY_TRACT
  Filled 2013-08-21 (×4): qty 3

## 2013-08-21 MED ORDER — IPRATROPIUM-ALBUTEROL 18-103 MCG/ACT IN AERO: 2.0000 | INHALATION_SPRAY | Freq: Four times a day (QID) | RESPIRATORY_TRACT | Status: DC | PRN

## 2013-08-21 NOTE — ED Provider Notes (Signed)
CSN: [id number]     Arrival date & time 08/21/13  0813 History   First MD Initiated Contact with Patient 08/21/13 0815     Chief Complaint  Patient presents with  . Shortness of Breath     (Consider location/radiation/quality/duration/timing/severity/associated sxs/prior Treatment) HPI Patient reports she she does not have a history of asthma as a child or as an adult. She had one episode in 2013 which was her first episode of bronchospasm. She was treated with an inhaler at that time. She reports last week she had diarrhea every time she ate. She denies been on antibiotics before that. She states however the diarrhea was resolved 3 days ago. She however reports she still has no energy. She started getting shortness of breath 2 nights ago and started using her expired inhaler from her 2013 visit. She states it did help. She denies hearing wheezing. However she states her chest feels tight like a band around it, but she denies chest pain. She has occasional dry coughing without fever. She feels like her nose is stuffy and has very mild sore throat. She has chronic swelling of her legs which is not worse. She also has chronic bladder incontinence. She states last night she used her CPAP with oxygen as she normally does however she woke up at 4 AM with difficulty breathing. She used her inhaler without improvement. At 5:30 she called her nurses and also called EMS. She reports she has had 2 nebulizer treatments by EMS and she is feeling better.  PCP Dr Asa Lente Pulmonary Dr Halford Chessman  Past Medical History  Diagnosis Date  . Morbid obesity   . MIGRAINE HEADACHE   . ARTHRITIS, KNEES, BILATERAL   . DEPRESSION   . GERD   . HYPERLIPIDEMIA   . HYPERTENSION   . Breast CA 2000    s/p R lumpectomy and XRT  . DJD (degenerative joint disease) of knee   . Urge incontinence   . HYPOTHYROIDISM     postsurgical  . OSA (obstructive sleep apnea) 01/17/2011 dx   Past Surgical History  Procedure Laterality Date   . Cholecystectomy  1995  . Breast lumpectomy  2000  . Thyroidectomy    . Breast biopsy  2000  . Replacement total knee bilateral  1999, 2005  . Tubal ligation    . Parathyroidectomy      2-3 removed  . Tonsillectomy    . Adenoidectomy    . Right leg femur repaired     Family History  Problem Relation Age of Onset  . Emphysema Sister   . Coronary artery disease Neg Hx    History  Substance Use Topics  . Smoking status: Former Smoker -- 0.25 packs/day for 2 years    Types: Cigarettes    Quit date: 06/19/1974  . Smokeless tobacco: Never Used     Comment: Quit in late 20's  . Alcohol Use: No     Comment: rare   Pt in independent senior living apartment Uses CPAP with oxygen at night 2 lpm [place] No second hand smoke   OB History   Grav Para Term Preterm Abortions TAB SAB Ect Mult Living                 Review of Systems  All other systems reviewed and are negative.      Allergies  Ciprofloxacin; Codeine; Epinephrine; Klonopin; Oxycodone; Paxil; Propoxyphene hcl; and Meloxicam  Home Medications   Current Outpatient Rx  Name  Route  Sig  Dispense  Refill  . albuterol-ipratropium (COMBIVENT) 18-103 MCG/ACT inhaler   Inhalation   Inhale 2 puffs into the lungs every 6 (six) hours as needed for wheezing (SOB).         [name] aspirin 81 MG tablet   Oral   Take 81 mg by mouth daily.           . clonazePAM (KLONOPIN) 0.5 MG tablet   Oral   Take 0.5 mg by mouth 2 (two) times daily as needed for anxiety.         [name] esomeprazole (NEXIUM) 40 MG capsule   Oral   Take 1 capsule (40 mg total) by mouth daily.   90 capsule   3   . FIBER PO   Oral   Take 2 tablets by mouth daily.          [name] levothyroxine (SYNTHROID, LEVOTHROID) 137 MCG tablet   Oral   Take 137 mcg by mouth daily before breakfast.         . loratadine (CLARITIN) 10 MG tablet   Oral   Take 10 mg by mouth daily as needed.          . nystatin (MYCOSTATIN/NYSTOP) 100000 UNIT/GM POWD    Topical   Apply 1 g topically 2 (two) times daily.   100 g   0   . Omega-3 Fatty Acids (FISH OIL PO)   Oral   Take 1,500 mg by mouth daily.          . phenazopyridine (AZO-TABS) 95 MG tablet   Oral   Take 95 mg by mouth daily as needed (for urinary pain).          . potassium chloride (K-DUR,KLOR-CON) 10 MEQ tablet   Oral   Take 2 tablets (20 mEq total) by mouth daily.   60 tablet   5   . SYNTHROID 137 MCG tablet      TAKE 1 BY MOUTH ONCE DAILY   30 tablet   5   . TRIBENZOR 20-5-12.5 MG TABS      TAKE 1 TABLET BY MOUTH EVERY DAY   30 tablet   5    BP 126/48  Pulse 105  Resp 26  SpO2 99%  Vital signs normal except tachycardia (patient on nebulizer)  Physical Exam  Nursing note and vitals reviewed. Constitutional: She is oriented to person, place, and time. She appears well-developed and well-nourished.  Non-toxic appearance. She does not appear ill. No distress.  HENT:  Head: Normocephalic and atraumatic.  Right Ear: External ear normal.  Left Ear: External ear normal.  Nose: Nose normal. No mucosal edema or rhinorrhea.  Mouth/Throat: Oropharynx is clear and moist and mucous membranes are normal. No dental abscesses or uvula swelling.  Eyes: Conjunctivae and EOM are normal. Pupils are equal, round, and reactive to light.  Neck: Normal range of motion and full passive range of motion without pain. Neck supple.  Cardiovascular: Normal rate, regular rhythm and normal heart sounds.  Exam reveals no gallop and no friction rub.   No murmur heard. Pulmonary/Chest: Breath sounds normal. Tachypnea noted. No respiratory distress. She has no wheezes. She has no rhonchi. She has no rales. She exhibits no tenderness and no crepitus.  Patient has mild tachypnea. Her lungs however are clear. She had art he received 2 nebulizer treatments by EMS.  Abdominal: Soft. Normal appearance and bowel sounds are normal. She exhibits no distension. There is no tenderness. There is no  rebound and no  guarding.  Musculoskeletal: Normal range of motion. She exhibits no edema and no tenderness.  Moves all extremities well.   Neurological: She is alert and oriented to person, place, and time. She has normal strength. No cranial nerve deficit.  Skin: Skin is warm, dry and intact. No rash noted. No erythema. No pallor.  Psychiatric: She has a normal mood and affect. Her speech is normal and behavior is normal. Her mood appears not anxious.    ED Course  Procedures (including critical care time)  Medications  predniSONE (DELTASONE) tablet 60 mg (60 mg Oral Given 08/21/13 0857)  potassium chloride SA (K-DUR,KLOR-CON) CR tablet 40 mEq (40 mEq Oral Given 08/21/13 1036)    PT states she is too weak to go home. Nursing staff tried to get her up and she was too weak.  PT states her breathing has been fine after the nebulizer x 2. She was started on oral potassium pills. She was started on steroids for her bronchitis.   11:21 Dr Doyle Askew, will admit to obs, team 8  Labs Review Results for orders placed during the hospital encounter of 08/21/13  CBC WITH DIFFERENTIAL      Result Value Ref Range   WBC 5.1  4.0 - 10.5 K/uL   RBC 4.48  3.87 - 5.11 MIL/uL   Hemoglobin 14.0  12.0 - 15.0 g/dL   HCT 40.9  36.0 - 46.0 %   MCV 91.3  78.0 - 100.0 fL   MCH 31.3  26.0 - 34.0 pg   MCHC 34.2  30.0 - 36.0 g/dL   RDW 13.3  11.5 - 15.5 %   Platelets 140 (*) 150 - 400 K/uL   Neutrophils Relative % 64  43 - 77 %   Neutro Abs 3.3  1.7 - 7.7 K/uL   Lymphocytes Relative 28  12 - 46 %   Lymphs Abs 1.4  0.7 - 4.0 K/uL   Monocytes Relative 6  3 - 12 %   Monocytes Absolute 0.3  0.1 - 1.0 K/uL   Eosinophils Relative 1  0 - 5 %   Eosinophils Absolute 0.1  0.0 - 0.7 K/uL   Basophils Relative 1  0 - 1 %   Basophils Absolute 0.0  0.0 - 0.1 K/uL  COMPREHENSIVE METABOLIC PANEL      Result Value Ref Range   Sodium 140  137 - 147 mEq/L   Potassium 2.9 (*) 3.7 - 5.3 mEq/L   Chloride 99  96 - 112 mEq/L   CO2  24  19 - 32 mEq/L   Glucose, Bld 145 (*) 70 - 99 mg/dL   BUN 13  6 - 23 mg/dL   Creatinine, Ser 0.86  0.50 - 1.10 mg/dL   Calcium 8.8  8.4 - 10.5 mg/dL   Total Protein 6.9  6.0 - 8.3 g/dL   Albumin 3.6  3.5 - 5.2 g/dL   AST 13  0 - 37 U/L   ALT 10  0 - 35 U/L   Alkaline Phosphatase 80  39 - 117 U/L   Total Bilirubin 0.5  0.3 - 1.2 mg/dL   GFR calc non Af Amer 62 (*) >90 mL/min   GFR calc Af Amer 72 (*) >90 mL/min  PRO B NATRIURETIC PEPTIDE      Result Value Ref Range   Pro B Natriuretic peptide (BNP) 106.0  0 - 450 pg/mL   Laboratory interpretation all normal except hypokalemia   Imaging Review Dg Chest 2 View  08/21/2013  CLINICAL DATA:  Cough and dyspnea  EXAM: CHEST  2 VIEW  COMPARISON:  DG CHEST 2 VIEW dated 02/28/2013  FINDINGS: The lungs are adequately inflated. There is no focal infiltrate. There is no pleural effusion. The cardiac silhouette is mildly enlarged but stable. The pulmonary vascularity is not engorged. There is no pleural effusion or pneumothorax. There are surgical clips in the right axillary region reportedly from previous lymph node dissection for breast malignancy. There is chronic cortical thickening of the posterior aspects of the right fifth and sixth ribs. The thoracic vertebral bodies are preserved in height.  IMPRESSION: 1. There is no evidence of pneumonia nor CHF. Chronically increased interstitial markings are present bilaterally. One cannot exclude acute bronchitis in the appropriate clinical setting. 2. There are no findings suspicious for pulmonary metastatic disease. Stable chronic deformity of the posterior aspects of the right fifth and sixth ribs is present.   Electronically Signed   By: David  Martinique   On: 08/21/2013 09:23     EKG Interpretation   Date/Time:  Thursday August 21 2013 09:32:59 EST Ventricular Rate:  90 PR Interval:  158 QRS Duration: 97 QT Interval:  365 QTC Calculation: 447 R Axis:   7 Text Interpretation:  Sinus rhythm  Probable left atrial enlargement RSR'  in V1 or V2, probably normal variant Minimal ST depression, lateral leads  No significant change since last tracing Confirmed by Laverna Dossett  MD-I, Ayham Word  ([id number]) on 08/21/2013 10:55:17 AM      MDM   patient presents with weakness after having had diarrhea that resolved 3 days ago. She is noted to have hypokalemia. She was started on oral potassium supplements. She also has acute bronchitis with bronchospasm which she had also in 2013. She responded well to nebulizer treatment started by EMS. She was given a one-time dose of steroids for her bronchitis.    Final diagnoses:  Weakness  Bronchitis, acute, with bronchospasm  Hypokalemia  History of diarrhea    Plan admission   Rolland Porter, MD, Alanson Aly, MD 08/21/13 [phone]

## 2013-08-21 NOTE — ED Notes (Signed)
Per EMS, pt is from [facility] [place]. Pt woke up w/ SOB at 4AM this morning. Pt tried combivent inhaler w/ no relief. Pt left side diminished, wheezing on right upon EMS arrival. 10 albuterol/.5 Atrovent nebs given. Pt states she felt relief with neb treatments. Pt sleeps w/ 2L O2 during night, none during day. Pt A&O x 4.

## 2013-08-21 NOTE — ED Notes (Signed)
Pt stated that she feels she might fall and refused to walk or stand.

## 2013-08-21 NOTE — Progress Notes (Signed)
CSW met with patient and daughter at bedside. Patient reports that she lives in the independent living section of [facility] and will return there upon discharge.  Tazia Illescas C. Parker MSW, [place]

## 2013-08-21 NOTE — ED Notes (Signed)
Informed RN of abnormal lab

## 2013-08-21 NOTE — ED Notes (Signed)
Bed: CQ19 Expected date:  Expected time:  Means of arrival:  Comments: SOB

## 2013-08-21 NOTE — ED Notes (Signed)
Unable to obtain pt's o2 while ambulating due to pt's weakness. Pt states she is unable to stand.

## 2013-08-21 NOTE — H&P (Signed)
[facility] History and Physical  RACHAL DVORSKY RCV:[id number] DOB: August 07, 1930 DOA: 08/21/2013  Referring physician: ED physician PCP: Gwendolyn Grant, MD   Chief Complaint: shortness of breath   HPI:  Tamara Morales is 80 yo female with bronchitis, bladder incontinence, who presented to [facility] ED with main concern of several days duration of progressively worsening shortness of breath associated with intermittent episodes of productive and non productive cough, unable to take deep breath, worse with exertion and occasionally improved with rest, no specific alleviating factors. She explains similar events in the past that were due to bronchitis flare ups but none of it was as severe as this episode. In addition, she reports watery diarrhea one week prior to this admission that has now improved but still present. She denies known sick contacts or exposures, no similar events in the past. She was given two nebulizer treatment in ED and says that has helped with symptoms.   In ED, Tamara Morales is hemodynamically stable, reports exertional shortness of breath but no chest pain. CXR with acute bronchitis. BMP with K 2.9. TR[facility] asked to admit for further evaluation.   Assessment and Plan: Active Problems: Acute respiratory failure - appears to be related to acute bronchitis flare  - will place on empiric ABX, start solumedrol given wheezing - also place on BD's scheduled and as needed - sputum analysis ordered, respiratory virus panel ordered as well, urine legionella and strep pneumo  - CT chest ordered for better evaluation Hypokalemia - could be secondary to diarrhea or use of albuterol - will supplement and repeat BMP  - check Mg as well - repeat BMP in AM Diarrhea - possibly viral in etiology - stool culture ordered, O&P, GI stool panel   Radiological Exams on Admission: Dg Chest 2 View   08/21/2013    1. There is no evidence of pneumonia nor CHF. Chronically increased interstitial markings are present  bilaterally. One cannot exclude acute bronchitis in the appropriate clinical setting. 2. There are no findings suspicious for pulmonary metastatic disease. Stable chronic deformity of the posterior aspects of the right fifth and sixth ribs is present.     Code Status: Full Family Communication: Tamara Morales and daughter at bedside Disposition Plan: Admit for further evaluation     Review of Systems:  Constitutional:Negative for diaphoresis.  HENT: Negative for hearing loss, ear pain, nosebleeds, congestion, sore throat, neck pain, tinnitus and ear discharge.   Eyes: Negative for blurred vision, double vision, photophobia, pain, discharge and redness.  Respiratory: Negative for wheezing and stridor.   Cardiovascular: Negative for chest pain, palpitations, orthopnea.  Gastrointestinal: Negative for heartburn, constipation, blood in stool and melena.  Genitourinary: Negative for dysuria, urgency, frequency, hematuria and flank pain.  Musculoskeletal: Negative for myalgias, back pain, joint pain and falls.  Skin: Negative for itching and rash.  Neurological: Negative for tingling, tremors, sensory change, speech change, focal weakness, loss of consciousness and headaches.  Endo/Heme/Allergies: Negative for environmental allergies and polydipsia. Does not bruise/bleed easily.  Psychiatric/Behavioral: Negative for suicidal ideas. The patient is not nervous/anxious.      Past Medical History  Diagnosis Date  . Morbid obesity   . MIGRAINE HEADACHE   . ARTHRITIS, KNEES, BILATERAL   . DEPRESSION   . GERD   . HYPERLIPIDEMIA   . HYPERTENSION   . Breast CA 2000    s/p R lumpectomy and XRT  . DJD (degenerative joint disease) of knee   . Urge incontinence   . HYPOTHYROIDISM  postsurgical  . OSA (obstructive sleep apnea) 01/17/2011 dx    Past Surgical History  Procedure Laterality Date  . Cholecystectomy  1995  . Breast lumpectomy  2000  . Thyroidectomy    . Breast biopsy  2000  . Replacement  total knee bilateral  1999, 2005  . Tubal ligation    . Parathyroidectomy      2-3 removed  . Tonsillectomy    . Adenoidectomy    . Right leg femur repaired      Social History:  reports that she quit smoking about 39 years ago. Her smoking use included Cigarettes. She has a .5 pack-year smoking history. She has never used smokeless tobacco. She reports that she does not drink alcohol or use illicit drugs.  Allergies  Allergen Reactions  . Ciprofloxacin Other (See Comments)    REACTION: edgy and very jumpy   . Codeine Nausea And Vomiting  . Epinephrine Other (See Comments)    REACTION: rapid pulse, sweats  . Klonopin [Clonazepam] Other (See Comments)    Makes her feel very suicidal   . Oxycodone Other (See Comments)    Patient felt like it altered her mental status "Crazy" . Hallucinations later as well.  [name] Paxil [Paroxetine Hydrochloride] Other (See Comments)    Sever depression   . Propoxyphene Hcl Nausea And Vomiting  . Meloxicam Diarrhea    Family History  Problem Relation Age of Onset  . Emphysema Sister   . Coronary artery disease Neg Hx     Prior to Admission medications   Medication Sig Start Date End Date Taking? Authorizing Provider  albuterol-ipratropium (COMBIVENT) 18-103 MCG/ACT inhaler Inhale 2 puffs into the lungs every 6 (six) hours as needed for wheezing (SOB).   Yes Historical Provider, MD  aspirin 81 MG tablet Take 81 mg by mouth daily.     Yes Historical Provider, MD  diphenhydrAMINE (BENADRYL) 25 mg capsule Take 25 mg by mouth every 6 (six) hours as needed for sleep.   Yes Historical Provider, MD  esomeprazole (NEXIUM) 40 MG capsule Take 1 capsule (40 mg total) by mouth daily. 08/04/13  Yes Rowe Clack, MD  FIBER PO Take 2 tablets by mouth daily.    Yes Historical Provider, MD  levothyroxine (SYNTHROID, LEVOTHROID) 137 MCG tablet Take 137 mcg by mouth daily before breakfast. Takes Brand name Synthroid   Yes Historical Provider, MD  loratadine  (CLARITIN) 10 MG tablet Take 10 mg by mouth daily as needed.    Yes Historical Provider, MD  nystatin (MYCOSTATIN/NYSTOP) 100000 UNIT/GM POWD Apply 1 g topically 2 (two) times daily. 05/21/13  Yes Rowe Clack, MD  Olmesartan-Amlodipine-HCTZ (TRIBENZOR) 20-5-12.5 MG TABS Take 1 tablet by mouth daily.   Yes Historical Provider, MD  Omega-3 Fatty Acids (FISH OIL) 1200 MG CAPS Take 2 capsules by mouth daily.   Yes Historical Provider, MD  pantoprazole (PROTONIX) 20 MG tablet Take 20 mg by mouth daily.   Yes Historical Provider, MD  phenazopyridine (AZO-TABS) 95 MG tablet Take 95 mg by mouth daily as needed (for urinary pain).    Yes Historical Provider, MD  potassium chloride (K-DUR,KLOR-CON) 10 MEQ tablet Take 2 tablets (20 mEq total) by mouth daily. 02/28/13  Yes Rowe Clack, MD    Physical Exam: Filed Vitals:   08/21/13 0827 08/21/13 0852  BP: 126/48   Pulse: 105   Temp:  97.9 F (36.6 C)  TempSrc:  Oral  Resp: 26   SpO2: 99%     Physical  Exam  Constitutional: Appears well-developed and well-nourished. No distress.  HENT: Normocephalic. External right and left ear normal. Oropharynx is clear and moist.  Eyes: Conjunctivae and EOM are normal. PERRLA, no scleral icterus.  Neck: Normal ROM. Neck supple. No JVD. No tracheal deviation. No thyromegaly.  CVS: RRR, S1/S2 +, no murmurs, no gallops, no carotid bruit.  Pulmonary: Effort and breath sounds normal, no stridor, rhonchi, wheezes, rales.  Abdominal: Soft. BS +,  no distension, tenderness, rebound or guarding.  Musculoskeletal: Normal range of motion. +1 bilateral LE pitting edema  Lymphadenopathy: No lymphadenopathy noted, cervical, inguinal. Neuro: Alert. Normal reflexes, muscle tone coordination. No cranial nerve deficit. Skin: Skin is warm and dry. No rash noted. Not diaphoretic. No erythema. No pallor.  Psychiatric: Normal mood and affect. Behavior, judgment, thought content normal.   Labs on Admission:  Basic  Metabolic Panel:  Recent Labs Lab 08/21/13 0845  NA 140  K 2.9*  CL 99  CO2 24  GLUCOSE 145*  BUN 13  CREATININE 0.86  CALCIUM 8.8   Liver Function Tests:  Recent Labs Lab 08/21/13 0845  AST 13  ALT 10  ALKPHOS 80  BILITOT 0.5  PROT 6.9  ALBUMIN 3.6   CBC:  Recent Labs Lab 08/21/13 0845  WBC 5.1  NEUTROABS 3.3  HGB 14.0  HCT 40.9  MCV 91.3  PLT 140*   EKG: Normal sinus rhythm, no ST/T wave changes  Faye Ramsay, MD  [facility] Pager [phone]  If 7PM-7AM, please contact night-coverage [email or web address] Password [email or web address] 08/21/2013, 11:21 AM

## 2013-08-22 ENCOUNTER — Inpatient Hospital Stay (HOSPITAL_COMMUNITY): Payer: Medicare Other

## 2013-08-22 DIAGNOSIS — R197 Diarrhea, unspecified: Secondary | ICD-10-CM | POA: Diagnosis not present

## 2013-08-22 DIAGNOSIS — J209 Acute bronchitis, unspecified: Secondary | ICD-10-CM | POA: Diagnosis not present

## 2013-08-22 DIAGNOSIS — R109 Unspecified abdominal pain: Secondary | ICD-10-CM | POA: Diagnosis not present

## 2013-08-22 DIAGNOSIS — G4739 Other sleep apnea: Secondary | ICD-10-CM | POA: Diagnosis not present

## 2013-08-22 DIAGNOSIS — F329 Major depressive disorder, single episode, unspecified: Secondary | ICD-10-CM | POA: Diagnosis not present

## 2013-08-22 DIAGNOSIS — R112 Nausea with vomiting, unspecified: Secondary | ICD-10-CM | POA: Diagnosis not present

## 2013-08-22 LAB — RESPIRATORY VIRUS PANEL
ADENOVIRUS: NOT DETECTED
INFLUENZA A H1: NOT DETECTED
Influenza A H3: NOT DETECTED
Influenza A: NOT DETECTED
Influenza B: NOT DETECTED
METAPNEUMOVIRUS: NOT DETECTED
Parainfluenza 1: NOT DETECTED
Parainfluenza 2: NOT DETECTED
Parainfluenza 3: NOT DETECTED
RHINOVIRUS: NOT DETECTED
Respiratory Syncytial Virus A: NOT DETECTED
Respiratory Syncytial Virus B: NOT DETECTED

## 2013-08-22 LAB — BASIC METABOLIC PANEL
BUN: 12 mg/dL (ref 6–23)
CHLORIDE: 101 meq/L (ref 96–112)
CO2: 25 meq/L (ref 19–32)
Calcium: 8.5 mg/dL (ref 8.4–10.5)
Creatinine, Ser: 0.86 mg/dL (ref 0.50–1.10)
GFR calc Af Amer: 72 mL/min — ABNORMAL LOW (ref >90)
GFR calc non Af Amer: 62 mL/min — ABNORMAL LOW (ref >90)
Glucose, Bld: 149 mg/dL — ABNORMAL HIGH (ref 70–99)
POTASSIUM: 3.8 meq/L (ref 3.7–5.3)
SODIUM: 140 meq/L (ref 137–147)

## 2013-08-22 LAB — EXPECTORATED SPUTUM ASSESSMENT W GRAM STAIN, RFLX TO RESP C

## 2013-08-22 LAB — CBC
HCT: 39.4 % (ref 36.0–46.0)
HEMOGLOBIN: 13.4 g/dL (ref 12.0–15.0)
MCH: 31.1 pg (ref 26.0–34.0)
MCHC: 34 g/dL (ref 30.0–36.0)
MCV: 91.4 fL (ref 78.0–100.0)
Platelets: 160 10*3/uL (ref 150–400)
RBC: 4.31 MIL/uL (ref 3.87–5.11)
RDW: 13.3 % (ref 11.5–15.5)
WBC: 6.6 10*3/uL (ref 4.0–10.5)

## 2013-08-22 LAB — STREP PNEUMONIAE URINARY ANTIGEN: STREP PNEUMO URINARY ANTIGEN: NEGATIVE

## 2013-08-22 LAB — HIV ANTIBODY (ROUTINE TESTING W REFLEX): HIV: NONREACTIVE

## 2013-08-22 LAB — EXPECTORATED SPUTUM ASSESSMENT W REFEX TO RESP CULTURE

## 2013-08-22 MED ORDER — POLYETHYLENE GLYCOL 3350 17 G PO PACK
17.0000 g | PACK | Freq: Every day | ORAL | Status: DC
  Administered 2013-08-23: 17 g via ORAL
  Filled 2013-08-22 (×2): qty 1

## 2013-08-22 MED ORDER — ACETAMINOPHEN 325 MG PO TABS
325.0000 mg | ORAL_TABLET | Freq: Four times a day (QID) | ORAL | Status: DC | PRN
  Filled 2013-08-22: qty 1

## 2013-08-22 MED ORDER — GI COCKTAIL ~~LOC~~
30.0000 mL | Freq: Two times a day (BID) | ORAL | Status: DC | PRN
  Administered 2013-08-23: 30 mL via ORAL
  Filled 2013-08-22: qty 30

## 2013-08-22 MED ORDER — IPRATROPIUM-ALBUTEROL 0.5-2.5 (3) MG/3ML IN SOLN: 3.0000 mL | Freq: Four times a day (QID) | RESPIRATORY_TRACT | Status: DC | PRN

## 2013-08-22 MED ORDER — IPRATROPIUM-ALBUTEROL 0.5-2.5 (3) MG/3ML IN SOLN
3.0000 mL | Freq: Three times a day (TID) | RESPIRATORY_TRACT | Status: DC
  Administered 2013-08-23: 3 mL via RESPIRATORY_TRACT
  Filled 2013-08-22: qty 3

## 2013-08-22 MED ORDER — METHYLPREDNISOLONE SODIUM SUCC 40 MG IJ SOLR
40.0000 mg | INTRAMUSCULAR | Status: DC
  Administered 2013-08-23: 40 mg via INTRAVENOUS
  Filled 2013-08-22 (×2): qty 1

## 2013-08-22 NOTE — Progress Notes (Signed)
[facility] PROGRESS NOTE  Tamara Morales ZDG:[id number] DOB: 23-Aug-1930 DOA: 08/21/2013 PCP: Rene Paci, MD  Assessment/Plan: 1. Acute bronchitis/SOB 1. Cont nebs with solumedrol as tolerated 2. Cont O2 as needed 3. Chest Ct without PE 4. Cont augmentin 5. Respiratory cultures pending 2. Hypokalemia 1. Replaced 3. Diarrhea 1. Stool studies pending 4. DVT prophylaxis 1. Lovenox  Code Status: full Family Communication: Pt in room (indicate person spoken with, relationship, and if by phone, the number) Disposition Plan: Pending   Antibiotics:  Augmentin 08/21/13>>> (indicate start date, and stop date if known)  HPI/Subjective: No acute events noted overnight  Objective: Filed Vitals:   08/21/13 2014 08/21/13 2145 08/22/13 0526 08/22/13 0904  BP:  134/71 144/78   Pulse:  97 73   Temp:  98 F (36.7 C) 97.7 F (36.5 C)   TempSrc:  Oral Oral   Resp: 18 18 18    Height:      Weight:      SpO2:  92% 93% 95%   No intake or output data in the 24 hours ending 08/22/13 1018 Filed Weights   08/21/13 1424  Weight: 131.4 kg (289 lb 11 oz)    Exam:   General:  Awake, in nad  Cardiovascular: regular, s1, s2  Respiratory: normal resp effort, no wheezing but decreased BS  Abdomen: soft, obese, nondistended  Musculoskeletal: perfused, no clubbing  Data Reviewed: Basic Metabolic Panel:  Recent Labs Lab 08/21/13 0845 08/21/13 1505 08/22/13 0520  NA 140 140 140  K 2.9* 3.6* 3.8  CL 99 99 101  CO2 24 22 25   GLUCOSE 145* 220* 149*  BUN 13 12 12   CREATININE 0.86 0.98 0.86  CALCIUM 8.8 8.5 8.5  MG  --  1.8  --   PHOS  --  2.9  --    Liver Function Tests:  Recent Labs Lab 08/21/13 0845  AST 13  ALT 10  ALKPHOS 80  BILITOT 0.5  PROT 6.9  ALBUMIN 3.6   No results found for this basename: LIPASE, AMYLASE,  in the last 168 hours No results found for this basename: AMMONIA,  in the last 168 hours CBC:  Recent Labs Lab 08/21/13 0845  08/22/13 0520  WBC 5.1 6.6  NEUTROABS 3.3  --   HGB 14.0 13.4  HCT 40.9 39.4  MCV 91.3 91.4  PLT 140* 160   Cardiac Enzymes: No results found for this basename: CKTOTAL, CKMB, CKMBINDEX, TROPONINI,  in the last 168 hours BNP (last 3 results)  Recent Labs  08/21/13 0845  PROBNP 106.0   CBG: No results found for this basename: GLUCAP,  in the last 168 hours  No results found for this or any previous visit (from the past 240 hour(s)).   Studies: Dg Chest 2 View  08/21/2013   CLINICAL DATA:  Cough and dyspnea  EXAM: CHEST  2 VIEW  COMPARISON:  DG CHEST 2 VIEW dated 02/28/2013  FINDINGS: The lungs are adequately inflated. There is no focal infiltrate. There is no pleural effusion. The cardiac silhouette is mildly enlarged but stable. The pulmonary vascularity is not engorged. There is no pleural effusion or pneumothorax. There are surgical clips in the right axillary region reportedly from previous lymph node dissection for breast malignancy. There is chronic cortical thickening of the posterior aspects of the right fifth and sixth ribs. The thoracic vertebral bodies are preserved in height.  IMPRESSION: 1. There is no evidence of pneumonia nor CHF. Chronically increased interstitial markings are present bilaterally. One  cannot exclude acute bronchitis in the appropriate clinical setting. 2. There are no findings suspicious for pulmonary metastatic disease. Stable chronic deformity of the posterior aspects of the right fifth and sixth ribs is present.   Electronically Signed   By: David  [place]   On: 08/21/2013 09:23   Ct Angio Chest Pe W/cm &/or Wo Cm  08/21/2013   CLINICAL DATA:  Shortness of breath.  History breast cancer.  EXAM: CT ANGIOGRAPHY CHEST WITH CONTRAST  TECHNIQUE: Multidetector CT imaging of the chest was performed using the standard protocol during bolus administration of intravenous contrast. Multiplanar CT image reconstructions and MIPs were obtained to evaluate the vascular  anatomy.  CONTRAST:  OMNIPAQUE IOHEXOL 350 MG/ML SOLN  COMPARISON:  Two-view chest 08/21/2013.  CTA chest 03/13/2013.  FINDINGS: Pulmonary arterial opacification is satisfactory. There is no significant filling defect within the central pulmonary arteries. Patient breathing motion degrades evaluation of the distal vessel is. Atherosclerotic calcifications are again noted within the thoracic aorta. The heart is enlarged. Coronary artery calcifications are present.  A low-density lesion within the anterior left spleen is stable. The heart size is normal. Coronary artery calcifications are evident.  The lungs are clear. The bone windows demonstrate extensive fusion of anterior osteophytes and rightward curvature of the lower thoracic spine. No focal lytic or blastic lesions are present.  Review of the MIP images confirms the above findings.  IMPRESSION: 1. No evidence for pulmonary embolus. 2. Stable cardiomegaly and coronary artery calcifications. 3. Atherosclerotic calcifications within the thoracic aorta. 4. Stable hypodense lesion within the anterior spleen, a chronic finding. 5. No acute or focal lesion to explain the patient's symptoms.   Electronically Signed   By: Gennette Pac M.D.   On: 08/21/2013 21:25    Scheduled Meds: . amoxicillin-clavulanate  1 tablet Oral TID  . aspirin  81 mg Oral Daily  . enoxaparin (LOVENOX) injection  60 mg Subcutaneous Q24H  . irbesartan  150 mg Oral Daily   And  . hydrochlorothiazide  12.5 mg Oral Daily  . ipratropium-albuterol  3 mL Nebulization QID  . levothyroxine  137 mcg Oral QAC breakfast  . methylPREDNISolone (SOLU-MEDROL) injection  60 mg Intravenous Q12H  . pantoprazole  20 mg Oral Daily  . potassium chloride  20 mEq Oral Daily   Continuous Infusions:   Active Problems:   Diarrhea  Time spent:  Scheryl Sanborn K  [facility] Pager [phone]. If 7PM-7AM, please contact night-coverage at [email or web address], password [facility] 08/22/2013,  10:18 AM  LOS: 1 day

## 2013-08-22 NOTE — Progress Notes (Signed)
Vitals was taken @ 1400 not 1600

## 2013-08-23 DIAGNOSIS — J9600 Acute respiratory failure, unspecified whether with hypoxia or hypercapnia: Secondary | ICD-10-CM | POA: Diagnosis not present

## 2013-08-23 DIAGNOSIS — R5383 Other fatigue: Secondary | ICD-10-CM

## 2013-08-23 DIAGNOSIS — E785 Hyperlipidemia, unspecified: Secondary | ICD-10-CM | POA: Diagnosis not present

## 2013-08-23 DIAGNOSIS — G4733 Obstructive sleep apnea (adult) (pediatric): Secondary | ICD-10-CM

## 2013-08-23 DIAGNOSIS — E039 Hypothyroidism, unspecified: Secondary | ICD-10-CM

## 2013-08-23 DIAGNOSIS — F410 Panic disorder [episodic paroxysmal anxiety] without agoraphobia: Secondary | ICD-10-CM

## 2013-08-23 DIAGNOSIS — R5381 Other malaise: Secondary | ICD-10-CM

## 2013-08-23 DIAGNOSIS — G4739 Other sleep apnea: Secondary | ICD-10-CM | POA: Diagnosis not present

## 2013-08-23 DIAGNOSIS — J209 Acute bronchitis, unspecified: Secondary | ICD-10-CM | POA: Diagnosis not present

## 2013-08-23 DIAGNOSIS — M129 Arthropathy, unspecified: Secondary | ICD-10-CM | POA: Diagnosis not present

## 2013-08-23 LAB — LEGIONELLA ANTIGEN, URINE: Legionella Antigen, Urine: NEGATIVE

## 2013-08-23 MED ORDER — PREDNISONE 5 MG PO TABS: 5.0000 mg | ORAL_TABLET | Freq: Every day | ORAL | Status: DC

## 2013-08-23 MED ORDER — SORBITOL 70 % PO SOLN: 15.0000 mL | Freq: Every day | ORAL | Status: DC | PRN

## 2013-08-23 MED ORDER — AMOXICILLIN-POT CLAVULANATE 500-125 MG PO TABS: 1.0000 | ORAL_TABLET | Freq: Three times a day (TID) | ORAL | Status: DC

## 2013-08-23 NOTE — Discharge Summary (Signed)
Physician Discharge Summary  Tamara Morales QMV:[id number] DOB: 1931-03-12 DOA: 08/21/2013  PCP: Gwendolyn Grant, MD  Admit date: 08/21/2013 Discharge date: 08/23/2013  Time spent: 35 minutes  Recommendations for Outpatient Follow-up:  1. Follow up with PCP in 1-2 weeks  Discharge Diagnoses:  Principal Problem:   Acute bronchitis Active Problems:   HYPOTHYROIDISM   Morbid obesity   DEPRESSION   HYPERTENSION   Diarrhea   Discharge Condition: Improved  Diet recommendation: High fiber  Filed Weights   08/21/13 1424  Weight: 131.4 kg (289 lb 11 oz)    History of present illness:  Pt is 80 yo female with bronchitis, bladder incontinence, who presented to [facility] ED with main concern of several days duration of progressively worsening shortness of breath associated with intermittent episodes of productive and non productive cough, unable to take deep breath, worse with exertion and occasionally improved with rest, no specific alleviating factors. She explains similar events in the past that were due to bronchitis flare ups but none of it was as severe as this episode. In addition, she reports watery diarrhea one week prior to this admission that has now improved but still present. She denies known sick contacts or exposures, no similar events in the past. She was given two nebulizer treatment in ED and says that has helped with symptoms.   In ED, pt is hemodynamically stable, reports exertional shortness of breath but no chest pain. CXR with acute bronchitis. BMP with K 2.9. [facility] asked to admit for further evaluation.   Hospital Course:  Acute bronchitis/SOB  1. Cont nebs with steroid wean as tolerated 2. Cont O2 as needed - has been on RA 3. Chest Ct without PE 4. Cont augmentin post discharge Hypokalemia  1. Replaced Diarrhea  1. Resolved 2. Pt had since noted no bowel movement over the past 2 days prior to admit. Abd xray done which revealed stool mainly in the ascending and  transverse colons. Recent CTA also reviewed, with evidence of large stool over distal transverse colon per my read. Patient will be discharged home with sorbitol, instructed to take small amounts at a time until effect. DVT prophylaxis  1. Continued on Lovenox  Discharge Exam: Filed Vitals:   08/22/13 1517 08/22/13 1600 08/22/13 2116 08/23/13 0743  BP:  121/48 114/51   Pulse:  84 83   Temp:  97.9 F (36.6 C) 98.5 F (36.9 C)   TempSrc:  Oral Oral   Resp:  17 18   Height:      Weight:      SpO2: 94% 100% 94% 90%    General: awake, in nad Cardiovascular: regular, s1, s2 Respiratory: normal resp effort, no wheezing  Discharge Instructions       Future Appointments Provider Department Dept Phone   11/19/2013 2:45 PM Rowe Clack, MD [facility] [phone]       Medication List         amoxicillin-clavulanate 500-125 MG per tablet  Commonly known as:  AUGMENTIN  Take 1 tablet (500 mg total) by mouth 3 (three) times daily.     aspirin 81 MG tablet  Take 81 mg by mouth daily.     AZO-TABS 95 MG tablet  Generic drug:  phenazopyridine  Take 95 mg by mouth daily as needed (for urinary pain).     citalopram 10 MG tablet  Commonly known as:  CELEXA  Take 10 mg by mouth as needed (anxiety).     COMBIVENT 18-103  MCG/ACT inhaler  Generic drug:  albuterol-ipratropium  Inhale 2 puffs into the lungs every 6 (six) hours as needed for wheezing (SOB).     diphenhydrAMINE 25 mg capsule  Commonly known as:  BENADRYL  Take 25 mg by mouth every 6 (six) hours as needed for sleep.     esomeprazole 40 MG capsule  Commonly known as:  NEXIUM  Take 1 capsule (40 mg total) by mouth daily.     FIBER PO  Take 2 tablets by mouth daily.     Fish Oil 1200 MG Caps  Take 2 capsules by mouth daily.     levothyroxine 137 MCG tablet  Commonly known as:  SYNTHROID, LEVOTHROID  Take 137 mcg by mouth daily before breakfast. Takes Brand name Synthroid      loratadine 10 MG tablet  Commonly known as:  CLARITIN  Take 10 mg by mouth daily as needed.     nystatin 100000 UNIT/GM Powd  Apply 1 g topically 2 (two) times daily.     pantoprazole 20 MG tablet  Commonly known as:  PROTONIX  Take 20 mg by mouth daily.     potassium chloride 10 MEQ tablet  Commonly known as:  K-DUR,KLOR-CON  Take 2 tablets (20 mEq total) by mouth daily.     predniSONE 5 MG tablet  Commonly known as:  DELTASONE  Take 1 tablet (5 mg total) by mouth daily with breakfast.     sorbitol 70 % solution  Take 15 mLs by mouth daily as needed.     TRIBENZOR 20-5-12.5 MG Tabs  Generic drug:  Olmesartan-Amlodipine-HCTZ  Take 1 tablet by mouth daily.     zolpidem 5 MG tablet  Commonly known as:  AMBIEN  Take 5 mg by mouth at bedtime as needed for sleep.       Allergies  Allergen Reactions  . Azithromycin Itching  . Ciprofloxacin Other (See Comments)    REACTION: edgy and very jumpy   . Codeine Nausea And Vomiting  . Epinephrine Other (See Comments)    REACTION: rapid pulse, sweats  . Klonopin [Clonazepam] Other (See Comments)    Makes her feel very suicidal   . Oxycodone Other (See Comments)    Patient felt like it altered her mental status "Crazy" . Hallucinations later as well.  [name] Paxil [Paroxetine Hydrochloride] Other (See Comments)    Sever depression   . Propoxyphene Hcl Nausea And Vomiting  . Meloxicam Diarrhea   Follow-up Information   Follow up with Gwendolyn Grant, MD. Schedule an appointment as soon as possible for a visit in 1 week.   Specialty:  Internal Medicine   Contact information:   [address] [place] [address] [phone]        The results of significant diagnostics from this hospitalization (including imaging, microbiology, ancillary and laboratory) are listed below for reference.    Significant Diagnostic Studies: Dg Chest 2 View  08/21/2013   CLINICAL DATA:  Cough and dyspnea  EXAM: CHEST   2 VIEW  COMPARISON:  DG CHEST 2 VIEW dated 02/28/2013  FINDINGS: The lungs are adequately inflated. There is no focal infiltrate. There is no pleural effusion. The cardiac silhouette is mildly enlarged but stable. The pulmonary vascularity is not engorged. There is no pleural effusion or pneumothorax. There are surgical clips in the right axillary region reportedly from previous lymph node dissection for breast malignancy. There is chronic cortical thickening of the posterior aspects of the  right fifth and sixth ribs. The thoracic vertebral bodies are preserved in height.  IMPRESSION: 1. There is no evidence of pneumonia nor CHF. Chronically increased interstitial markings are present bilaterally. One cannot exclude acute bronchitis in the appropriate clinical setting. 2. There are no findings suspicious for pulmonary metastatic disease. Stable chronic deformity of the posterior aspects of the right fifth and sixth ribs is present.   Electronically Signed   By: David  [place]   On: 08/21/2013 09:23   Ct Angio Chest Pe W/cm &/or Wo Cm  08/21/2013   CLINICAL DATA:  Shortness of breath.  History breast cancer.  EXAM: CT ANGIOGRAPHY CHEST WITH CONTRAST  TECHNIQUE: Multidetector CT imaging of the chest was performed using the standard protocol during bolus administration of intravenous contrast. Multiplanar CT image reconstructions and MIPs were obtained to evaluate the vascular anatomy.  CONTRAST:  127m OMNIPAQUE IOHEXOL 350 MG/ML SOLN  COMPARISON:  Two-view chest 08/21/2013.  CTA chest 03/13/2013.  FINDINGS: Pulmonary arterial opacification is satisfactory. There is no significant filling defect within the central pulmonary arteries. Patient breathing motion degrades evaluation of the distal vessel is. Atherosclerotic calcifications are again noted within the thoracic aorta. The heart is enlarged. Coronary artery calcifications are present.  A low-density lesion within the anterior left spleen is stable. The heart  size is normal. Coronary artery calcifications are evident.  The lungs are clear. The bone windows demonstrate extensive fusion of anterior osteophytes and rightward curvature of the lower thoracic spine. No focal lytic or blastic lesions are present.  Review of the MIP images confirms the above findings.  IMPRESSION: 1. No evidence for pulmonary embolus. 2. Stable cardiomegaly and coronary artery calcifications. 3. Atherosclerotic calcifications within the thoracic aorta. 4. Stable hypodense lesion within the anterior spleen, a chronic finding. 5. No acute or focal lesion to explain the patient's symptoms.   Electronically Signed   By: CLawrence SantiagoM.D.   On: 08/21/2013 21:25   Dg Abd Portable 1v  08/22/2013   CLINICAL DATA:  Abdominal pain, distention, nausea, vomiting.  EXAM: PORTABLE ABDOMEN - 1 VIEW  COMPARISON:  CT ABD/PELV WO CM dated 03/20/2013  FINDINGS: Prior cholecystectomy. Nonobstructive bowel gas pattern. No free air, organomegaly or suspicious calcification. Degenerative changes in the thoracic spinal scoliosis. Degenerative changes in the hips. No acute bony abnormality.  IMPRESSION: No acute findings.   Electronically Signed   By: KRolm BaptiseM.D.   On: 08/22/2013 19:31    Microbiology: Recent Results (from the past 240 hour(s))  RESPIRATORY VIRUS PANEL     Status: None   Collection Time    08/21/13 10:55 PM      Result Value Ref Range Status   Source - RVPAN NOSE   Final   Respiratory Syncytial Virus A NOT DETECTED   Final   Respiratory Syncytial Virus B NOT DETECTED   Final   Influenza A NOT DETECTED   Final   Influenza B NOT DETECTED   Final   Parainfluenza 1 NOT DETECTED   Final   Parainfluenza 2 NOT DETECTED   Final   Parainfluenza 3 NOT DETECTED   Final   Metapneumovirus NOT DETECTED   Final   Rhinovirus NOT DETECTED   Final   Adenovirus NOT DETECTED   Final   Influenza A H1 NOT DETECTED   Final   Influenza A H3 NOT DETECTED   Final   Comment: (NOTE)           Normal  Reference Range for each  Analyte: NOT DETECTED     Testing performed using the Lum[employer] xTAG Respiratory Viral Panel test     kit.     This test was developed and its performance characteristics determined     by [employer]. It has not been cleared or approved by the [place]     Food and Drug Administration. This test is used for clinical purposes.     It should not be regarded as investigational or for research. This     laboratory is certified under the [place] (CLIA) as qualified to perform high complexity     clinical laboratory testing.     Performed at [place], EXPECTORATED SPUTUM-ASSESSMENT     Status: None   Collection Time    08/22/13 12:42 PM      Result Value Ref Range Status   Specimen Description SPUTUM   Final   Special Requests NONE   Final   Sputum evaluation     Final   Value: MICROSCOPIC FINDINGS SUGGEST THAT THIS SPECIMEN IS NOT REPRESENTATIVE OF LOWER RESPIRATORY SECRETIONS. PLEASE RECOLLECT.     Leland Johns RN AT 1300 ON 03.06.15 BY SHUEA   Report Status 08/22/2013 FINAL   Final     Labs: Basic Metabolic Panel:  Recent Labs Lab 08/21/13 0845 08/21/13 1505 08/22/13 0520  NA 140 140 140  K 2.9* 3.6* 3.8  CL 99 99 101  CO2 '24 22 25  ' GLUCOSE 145* 220* 149*  BUN '13 12 12  ' CREATININE 0.86 0.98 0.86  CALCIUM 8.8 8.5 8.5  MG  --  1.8  --   PHOS  --  2.9  --    Liver Function Tests:  Recent Labs Lab 08/21/13 0845  AST 13  ALT 10  ALKPHOS 80  BILITOT 0.5  PROT 6.9  ALBUMIN 3.6   No results found for this basename: LIPASE, AMYLASE,  in the last 168 hours No results found for this basename: AMMONIA,  in the last 168 hours CBC:  Recent Labs Lab 08/21/13 0845 08/22/13 0520  WBC 5.1 6.6  NEUTROABS 3.3  --   HGB 14.0 13.4  HCT 40.9 39.4  MCV 91.3 91.4  PLT 140* 160   Cardiac Enzymes: No results found for this basename: CKTOTAL, CKMB, CKMBINDEX, TROPONINI,  in the  last 168 hours BNP: BNP (last 3 results)  Recent Labs  08/21/13 0845  PROBNP 106.0   CBG: No results found for this basename: GLUCAP,  in the last 168 hours   Signed:  CHIU, STEPHEN K  [facility] 08/23/2013, 9:51 AM

## 2013-08-24 ENCOUNTER — Encounter (HOSPITAL_COMMUNITY): Payer: Self-pay | Admitting: Emergency Medicine

## 2013-08-24 ENCOUNTER — Emergency Department (HOSPITAL_COMMUNITY)
Admission: EM | Admit: 2013-08-24 | Discharge: 2013-08-24 | Disposition: A | Discharge status: Home / Self Care | Payer: Medicare Other | Attending: Emergency Medicine | Admitting: Emergency Medicine

## 2013-08-24 DIAGNOSIS — R5383 Other fatigue: Secondary | ICD-10-CM

## 2013-08-24 DIAGNOSIS — F329 Major depressive disorder, single episode, unspecified: Secondary | ICD-10-CM | POA: Insufficient documentation

## 2013-08-24 DIAGNOSIS — G4733 Obstructive sleep apnea (adult) (pediatric): Secondary | ICD-10-CM | POA: Diagnosis not present

## 2013-08-24 DIAGNOSIS — I10 Essential (primary) hypertension: Secondary | ICD-10-CM | POA: Diagnosis not present

## 2013-08-24 DIAGNOSIS — E785 Hyperlipidemia, unspecified: Secondary | ICD-10-CM | POA: Diagnosis not present

## 2013-08-24 DIAGNOSIS — Z87891 Personal history of nicotine dependence: Secondary | ICD-10-CM | POA: Insufficient documentation

## 2013-08-24 DIAGNOSIS — Z7982 Long term (current) use of aspirin: Secondary | ICD-10-CM | POA: Insufficient documentation

## 2013-08-24 DIAGNOSIS — R5381 Other malaise: Secondary | ICD-10-CM | POA: Diagnosis not present

## 2013-08-24 DIAGNOSIS — G43909 Migraine, unspecified, not intractable, without status migrainosus: Secondary | ICD-10-CM | POA: Insufficient documentation

## 2013-08-24 DIAGNOSIS — Z79899 Other long term (current) drug therapy: Secondary | ICD-10-CM | POA: Insufficient documentation

## 2013-08-24 DIAGNOSIS — Z792 Long term (current) use of antibiotics: Secondary | ICD-10-CM | POA: Insufficient documentation

## 2013-08-24 DIAGNOSIS — E039 Hypothyroidism, unspecified: Secondary | ICD-10-CM | POA: Diagnosis not present

## 2013-08-24 DIAGNOSIS — IMO0002 Reserved for concepts with insufficient information to code with codable children: Secondary | ICD-10-CM | POA: Diagnosis not present

## 2013-08-24 DIAGNOSIS — E860 Dehydration: Secondary | ICD-10-CM | POA: Diagnosis not present

## 2013-08-24 DIAGNOSIS — F3289 Other specified depressive episodes: Secondary | ICD-10-CM | POA: Insufficient documentation

## 2013-08-24 DIAGNOSIS — M1710 Unilateral primary osteoarthritis, unspecified knee: Secondary | ICD-10-CM | POA: Insufficient documentation

## 2013-08-24 DIAGNOSIS — Z853 Personal history of malignant neoplasm of breast: Secondary | ICD-10-CM | POA: Insufficient documentation

## 2013-08-24 DIAGNOSIS — K219 Gastro-esophageal reflux disease without esophagitis: Secondary | ICD-10-CM | POA: Insufficient documentation

## 2013-08-24 DIAGNOSIS — R7309 Other abnormal glucose: Secondary | ICD-10-CM | POA: Insufficient documentation

## 2013-08-24 LAB — CBC WITH DIFFERENTIAL/PLATELET
BASOS ABS: 0 10*3/uL (ref 0.0–0.1)
BASOS PCT: 0 % (ref 0–1)
Eosinophils Absolute: 0 10*3/uL (ref 0.0–0.7)
Eosinophils Relative: 0 % (ref 0–5)
HEMATOCRIT: 43.3 % (ref 36.0–46.0)
Hemoglobin: 14.7 g/dL (ref 12.0–15.0)
Lymphocytes Relative: 7 % — ABNORMAL LOW (ref 12–46)
Lymphs Abs: 0.5 10*3/uL — ABNORMAL LOW (ref 0.7–4.0)
MCH: 31.5 pg (ref 26.0–34.0)
MCHC: 33.9 g/dL (ref 30.0–36.0)
MCV: 92.9 fL (ref 78.0–100.0)
MONO ABS: 0.1 10*3/uL (ref 0.1–1.0)
Monocytes Relative: 2 % — ABNORMAL LOW (ref 3–12)
Neutro Abs: 6.6 10*3/uL (ref 1.7–7.7)
Neutrophils Relative %: 91 % — ABNORMAL HIGH (ref 43–77)
Platelets: 147 10*3/uL — ABNORMAL LOW (ref 150–400)
RBC: 4.66 MIL/uL (ref 3.87–5.11)
RDW: 13.4 % (ref 11.5–15.5)
WBC: 7.3 10*3/uL (ref 4.0–10.5)

## 2013-08-24 LAB — BASIC METABOLIC PANEL
BUN: 20 mg/dL (ref 6–23)
CO2: 28 mEq/L (ref 19–32)
CREATININE: 0.81 mg/dL (ref 0.50–1.10)
Calcium: 8.8 mg/dL (ref 8.4–10.5)
Chloride: 97 mEq/L (ref 96–112)
GFR calc non Af Amer: 67 mL/min — ABNORMAL LOW (ref >90)
GFR, EST AFRICAN AMERICAN: 77 mL/min — AB (ref >90)
Glucose, Bld: 146 mg/dL — ABNORMAL HIGH (ref 70–99)
Potassium: 4.9 mEq/L (ref 3.7–5.3)
Sodium: 140 mEq/L (ref 137–147)

## 2013-08-24 MED ORDER — SODIUM CHLORIDE 0.9 % IV BOLUS (SEPSIS)
1000.0000 mL | Freq: Once | INTRAVENOUS | Status: AC
  Administered 2013-08-24: 1000 mL via INTRAVENOUS

## 2013-08-24 NOTE — ED Provider Notes (Addendum)
CSN: [id number]     Arrival date & time 08/24/13  0203 History   First MD Initiated Contact with Patient 08/24/13 1928     Chief Complaint  Patient presents with  . Weakness  . Diarrhea     (Consider location/radiation/quality/duration/timing/severity/associated sxs/prior Treatment) HPI Signs of generalized weakness and multiple episodes of watery diarrhea since leaving the hospital yesterday. She also feels generalized malaise. She feels dehydrated. She denies blood per rectum. She also reports that "prednisone makes me feel strange" she's been treated with prednisone for 3 days for bronchitis. She has also been treated with Augmentin and sorbitol. Augmentin was for bronchitis and sorbitol was to treat prior constipation. She denies pain anywhere. No other associated symptoms. Generalized weakness is worse with standing. No vomiting no chest pain no abdominal pain no other associated symptoms. Breathing and cough has improved substantially since leaving the hospital. She is now breathing at baseline. Denies shortness of breath. Past Medical History  Diagnosis Date  . Morbid obesity   . MIGRAINE HEADACHE   . ARTHRITIS, KNEES, BILATERAL   . DEPRESSION   . GERD   . HYPERLIPIDEMIA   . HYPERTENSION   . Breast CA 2000    s/p R lumpectomy and XRT  . DJD (degenerative joint disease) of knee   . Urge incontinence   . HYPOTHYROIDISM     postsurgical  . OSA (obstructive sleep apnea) 01/17/2011 dx   Past Surgical History  Procedure Laterality Date  . Cholecystectomy  1995  . Breast lumpectomy  2000  . Thyroidectomy    . Breast biopsy  2000  . Replacement total knee bilateral  1999, 2005  . Tubal ligation    . Parathyroidectomy      2-3 removed  . Tonsillectomy    . Adenoidectomy    . Right leg femur repaired     Family History  Problem Relation Age of Onset  . Emphysema Sister   . Coronary artery disease Neg Hx    History  Substance Use Topics  . Smoking status: Former Smoker --  0.25 packs/day for 2 years    Types: Cigarettes    Quit date: 06/19/1974  . Smokeless tobacco: Never Used     Comment: Quit in late 20's  . Alcohol Use: No     Comment: rare   OB History   Grav Para Term Preterm Abortions TAB SAB Ect Mult Living                 Review of Systems  Gastrointestinal: Positive for diarrhea.  Neurological: Positive for weakness.  All other systems reviewed and are negative.      Allergies  Azithromycin; Ciprofloxacin; Codeine; Epinephrine; Klonopin; Oxycodone; Paxil; Propoxyphene hcl; and Meloxicam  Home Medications   Current Outpatient Rx  Name  Route  Sig  Dispense  Refill  . albuterol-ipratropium (COMBIVENT) 18-103 MCG/ACT inhaler   Inhalation   Inhale 2 puffs into the lungs every 6 (six) hours as needed for wheezing (SOB).         [name] amoxicillin-clavulanate (AUGMENTIN) 500-125 MG per tablet   Oral   Take 1 tablet (500 mg total) by mouth 3 (three) times daily.           Seven day supply, zero refills   . aspirin 81 MG tablet   Oral   Take 81 mg by mouth daily.           . citalopram (CELEXA) 10 MG tablet   Oral   Take  10 mg by mouth as needed (anxiety).         [name] diphenhydrAMINE (BENADRYL) 25 mg capsule   Oral   Take 25 mg by mouth every 6 (six) hours as needed for sleep.         [name] esomeprazole (NEXIUM) 40 MG capsule   Oral   Take 1 capsule (40 mg total) by mouth daily.   90 capsule   3   . FIBER PO   Oral   Take 2 tablets by mouth daily.          [name] levothyroxine (SYNTHROID, LEVOTHROID) 137 MCG tablet   Oral   Take 137 mcg by mouth daily before breakfast. Takes Brand name Synthroid         . loratadine (CLARITIN) 10 MG tablet   Oral   Take 10 mg by mouth daily as needed.          . nystatin (MYCOSTATIN/NYSTOP) 100000 UNIT/GM POWD   Topical   Apply 1 g topically 2 (two) times daily.   100 g   0   . Olmesartan-Amlodipine-HCTZ (TRIBENZOR) 20-5-12.5 MG TABS   Oral   Take 1 tablet by mouth daily.          . Omega-3 Fatty Acids (FISH OIL) 1200 MG CAPS   Oral   Take 2 capsules by mouth daily.         . pantoprazole (PROTONIX) 20 MG tablet   Oral   Take 20 mg by mouth daily.         . phenazopyridine (AZO-TABS) 95 MG tablet   Oral   Take 95 mg by mouth daily as needed (for urinary pain).          . potassium chloride (K-DUR,KLOR-CON) 10 MEQ tablet   Oral   Take 2 tablets (20 mEq total) by mouth daily.   60 tablet   5   . predniSONE (DELTASONE) 5 MG tablet   Oral   Take 1 tablet (5 mg total) by mouth daily with breakfast.           Taper dose: $RemoveBef'60mg'setusEOkVQ$  po x 3 days, then $RemoveBe'40mg'WxXdqlRQU$  po x 3 day ...   . sorbitol 70 % solution   Oral   Take 15 mLs by mouth daily as needed.   473 mL   0     Take small sips until effect   . zolpidem (AMBIEN) 5 MG tablet   Oral   Take 5 mg by mouth at bedtime as needed for sleep.          BP 183/85  Pulse 83  Temp(Src) 97.3 F (36.3 C) (Oral)  Resp 20  SpO2 93% Physical Exam  Nursing note and vitals reviewed. Constitutional: She appears well-developed and well-nourished.  HENT:  Head: Normocephalic and atraumatic.  Mucous membranes dry  Eyes: Conjunctivae are normal. Pupils are equal, round, and reactive to light.  Neck: Neck supple. No tracheal deviation present. No thyromegaly present.  Cardiovascular: Normal rate and regular rhythm.   No murmur heard. Pulmonary/Chest: Effort normal and breath sounds normal.  Abdominal: Soft. Bowel sounds are normal. She exhibits no distension. There is no tenderness.  Morbidly obese  Musculoskeletal: Normal range of motion. She exhibits no edema and no tenderness.  Neurological: She is alert. Coordination normal.  Skin: Skin is warm and dry. No rash noted.  Psychiatric: She has a normal mood and affect.    ED Course  Procedures (including critical care time) Labs Review Labs Reviewed  BASIC METABOLIC PANEL  CBC WITH DIFFERENTIAL   Imaging Review No results found.   EKG  Interpretation None     9:40 PM patient feels much improved after intravenous hydration with normal saline. She also ate and drank in the emergency department. She's not lightheaded on standing. Results for orders placed during the hospital encounter of 67/61/95  BASIC METABOLIC PANEL      Result Value Ref Range   Sodium 140  137 - 147 mEq/L   Potassium 4.9  3.7 - 5.3 mEq/L   Chloride 97  96 - 112 mEq/L   CO2 28  19 - 32 mEq/L   Glucose, Bld 146 (*) 70 - 99 mg/dL   BUN 20  6 - 23 mg/dL   Creatinine, Ser 0.81  0.50 - 1.10 mg/dL   Calcium 8.8  8.4 - 10.5 mg/dL   GFR calc non Af Amer 67 (*) >90 mL/min   GFR calc Af Amer 77 (*) >90 mL/min  CBC WITH DIFFERENTIAL      Result Value Ref Range   WBC 7.3  4.0 - 10.5 K/uL   RBC 4.66  3.87 - 5.11 MIL/uL   Hemoglobin 14.7  12.0 - 15.0 g/dL   HCT 43.3  36.0 - 46.0 %   MCV 92.9  78.0 - 100.0 fL   MCH 31.5  26.0 - 34.0 pg   MCHC 33.9  30.0 - 36.0 g/dL   RDW 13.4  11.5 - 15.5 %   Platelets 147 (*) 150 - 400 K/uL   Neutrophils Relative % 91 (*) 43 - 77 %   Neutro Abs 6.6  1.7 - 7.7 K/uL   Lymphocytes Relative 7 (*) 12 - 46 %   Lymphs Abs 0.5 (*) 0.7 - 4.0 K/uL   Monocytes Relative 2 (*) 3 - 12 %   Monocytes Absolute 0.1  0.1 - 1.0 K/uL   Eosinophils Relative 0  0 - 5 %   Eosinophils Absolute 0.0  0.0 - 0.7 K/uL   Basophils Relative 0  0 - 1 %   Basophils Absolute 0.0  0.0 - 0.1 K/uL   Dg Chest 2 View  08/21/2013   CLINICAL DATA:  Cough and dyspnea  EXAM: CHEST  2 VIEW  COMPARISON:  DG CHEST 2 VIEW dated 02/28/2013  FINDINGS: The lungs are adequately inflated. There is no focal infiltrate. There is no pleural effusion. The cardiac silhouette is mildly enlarged but stable. The pulmonary vascularity is not engorged. There is no pleural effusion or pneumothorax. There are surgical clips in the right axillary region reportedly from previous lymph node dissection for breast malignancy. There is chronic cortical thickening of the posterior aspects of  the right fifth and sixth ribs. The thoracic vertebral bodies are preserved in height.  IMPRESSION: 1. There is no evidence of pneumonia nor CHF. Chronically increased interstitial markings are present bilaterally. One cannot exclude acute bronchitis in the appropriate clinical setting. 2. There are no findings suspicious for pulmonary metastatic disease. Stable chronic deformity of the posterior aspects of the right fifth and sixth ribs is present.   Electronically Signed   By: David  Martinique   On: 08/21/2013 09:23   Ct Angio Chest Pe W/cm &/or Wo Cm  08/21/2013   CLINICAL DATA:  Shortness of breath.  History breast cancer.  EXAM: CT ANGIOGRAPHY CHEST WITH CONTRAST  TECHNIQUE: Multidetector CT imaging of the chest was performed using the standard protocol during bolus administration of intravenous contrast. Multiplanar CT image reconstructions and  MIPs were obtained to evaluate the vascular anatomy.  CONTRAST:  131mL OMNIPAQUE IOHEXOL 350 MG/ML SOLN  COMPARISON:  Two-view chest 08/21/2013.  CTA chest 03/13/2013.  FINDINGS: Pulmonary arterial opacification is satisfactory. There is no significant filling defect within the central pulmonary arteries. Patient breathing motion degrades evaluation of the distal vessel is. Atherosclerotic calcifications are again noted within the thoracic aorta. The heart is enlarged. Coronary artery calcifications are present.  A low-density lesion within the anterior left spleen is stable. The heart size is normal. Coronary artery calcifications are evident.  The lungs are clear. The bone windows demonstrate extensive fusion of anterior osteophytes and rightward curvature of the lower thoracic spine. No focal lytic or blastic lesions are present.  Review of the MIP images confirms the above findings.  IMPRESSION: 1. No evidence for pulmonary embolus. 2. Stable cardiomegaly and coronary artery calcifications. 3. Atherosclerotic calcifications within the thoracic aorta. 4. Stable  hypodense lesion within the anterior spleen, a chronic finding. 5. No acute or focal lesion to explain the patient's symptoms.   Electronically Signed   By: Lawrence Santiago M.D.   On: 08/21/2013 21:25   Dg Abd Portable 1v  08/22/2013   CLINICAL DATA:  Abdominal pain, distention, nausea, vomiting.  EXAM: PORTABLE ABDOMEN - 1 VIEW  COMPARISON:  CT ABD/PELV WO CM dated 03/20/2013  FINDINGS: Prior cholecystectomy. Nonobstructive bowel gas pattern. No free air, organomegaly or suspicious calcification. Degenerative changes in the thoracic spinal scoliosis. Degenerative changes in the hips. No acute bony abnormality.  IMPRESSION: No acute findings.   Electronically Signed   By: Rolm Baptise M.D.   On: 08/22/2013 19:31    MDM   Final diagnoses:  None   plan discontinue prednisone, sorbitol and Augmentin. Prednisone is causing her to feel agitated and sorbitol Augmentin likely contributing to diarrhea. Encourage oral hydration. Blood pressure recheck within a week. Followup with Dr.Lescchber Diagnosis #1 dehydration #2hypertension #3 hyperglycemia    Orlie Dakin, MD 08/24/13 2147  Orlie Dakin, MD 08/24/13 2147

## 2013-08-24 NOTE — ED Notes (Signed)
Patient tolerated sandwich and fluids well.

## 2013-08-24 NOTE — Discharge Instructions (Signed)
Dehydration, Adult Drink six 8 ounce glasses of water daily. Stop sorbitol and Augmentin (amoxicillin-clavulanate) as they both contribute to diarrhea. It is also okay to stop the prednisone since your  breathing has improved. Avoid milk or foods containing milk such as cheese or ice cream while having diarrhea. Your blood sugar was mildly elevated today at 146. Prednisone can cause an elevation in blood sugar. Blood pressure was also elevated at 160/80. Call Dr.Leschber to arrange an office appointment to be seen within the next 1 or2 weeks to recheck blood sugar and blood pressure. Return if you feel worse for any reason Dehydration means your body does not have as much fluid as it needs. Your kidneys, brain, and heart will not work properly without the right amount of fluids and salt.  HOME CARE  Ask your doctor how to replace body fluid losses (rehydrate).  Drink enough fluids to keep your pee (urine) clear or pale yellow.  Drink small amounts of fluids often if you feel sick to your stomach (nauseous) or throw up (vomit).  Eat like you normally do.  Avoid:  Foods or drinks high in sugar.  Bubbly (carbonated) drinks.  Juice.  Very hot or cold fluids.  Drinks with caffeine.  Fatty, greasy foods.  Alcohol.  Tobacco.  Eating too much.  Gelatin desserts.  Wash your hands to avoid spreading germs (bacteria, viruses).  Only take medicine as told by your doctor.  Keep all doctor visits as told. GET HELP RIGHT AWAY IF:   You cannot drink something without throwing up.  You get worse even with treatment.  Your vomit has blood in it or looks greenish.  Your poop (stool) has blood in it or looks black and tarry.  You have not peed in 6 to 8 hours.  You pee a small amount of very dark pee.  You have a fever.  You pass out (faint).  You have belly (abdominal) pain that gets worse or stays in one spot (localizes).  You have a rash, stiff neck, or bad headache.  You  get easily annoyed, sleepy, or are hard to wake up.  You feel weak, dizzy, or very thirsty. MAKE SURE YOU:   Understand these instructions.  Will watch your condition.  Will get help right away if you are not doing well or get worse. Document Released: 04/01/2009 Document Revised: 08/28/2011 Document Reviewed: 01/23/2011 [facility] Patient Information 2014 [place], [place].

## 2013-08-24 NOTE — ED Notes (Signed)
She states she was recently hospitalized for bronchitis, and as an incidental finding during that visit (released yesterday) she was found to have much stool in transverse colon. An o.t.c. Laxative was recommended that finally worked today; and today she has had several watery diarrhea stools.  She feels "real bad and weak and dehydrated".  She is in no distress and is here with her daughter.

## 2013-08-25 DIAGNOSIS — R059 Cough, unspecified: Secondary | ICD-10-CM | POA: Diagnosis not present

## 2013-08-25 DIAGNOSIS — R350 Frequency of micturition: Secondary | ICD-10-CM | POA: Diagnosis not present

## 2013-08-25 DIAGNOSIS — R05 Cough: Secondary | ICD-10-CM | POA: Diagnosis not present

## 2013-08-26 ENCOUNTER — Encounter (HOSPITAL_COMMUNITY): Payer: Self-pay | Admitting: Emergency Medicine

## 2013-08-26 ENCOUNTER — Other Ambulatory Visit: Payer: Self-pay | Admitting: Internal Medicine

## 2013-08-26 ENCOUNTER — Emergency Department (HOSPITAL_COMMUNITY)
Admission: EM | Admit: 2013-08-26 | Discharge: 2013-08-26 | Disposition: A | Discharge status: Home / Self Care | Payer: Medicare Other | Attending: Emergency Medicine | Admitting: Emergency Medicine

## 2013-08-26 ENCOUNTER — Ambulatory Visit: Payer: Medicare Other | Admitting: Internal Medicine

## 2013-08-26 ENCOUNTER — Emergency Department (HOSPITAL_COMMUNITY): Payer: Medicare Other

## 2013-08-26 DIAGNOSIS — K219 Gastro-esophageal reflux disease without esophagitis: Secondary | ICD-10-CM | POA: Diagnosis not present

## 2013-08-26 DIAGNOSIS — R1013 Epigastric pain: Secondary | ICD-10-CM | POA: Diagnosis not present

## 2013-08-26 DIAGNOSIS — Z9851 Tubal ligation status: Secondary | ICD-10-CM | POA: Insufficient documentation

## 2013-08-26 DIAGNOSIS — Z79899 Other long term (current) drug therapy: Secondary | ICD-10-CM | POA: Diagnosis not present

## 2013-08-26 DIAGNOSIS — M1710 Unilateral primary osteoarthritis, unspecified knee: Secondary | ICD-10-CM | POA: Insufficient documentation

## 2013-08-26 DIAGNOSIS — R197 Diarrhea, unspecified: Secondary | ICD-10-CM | POA: Diagnosis not present

## 2013-08-26 DIAGNOSIS — Z7982 Long term (current) use of aspirin: Secondary | ICD-10-CM | POA: Diagnosis not present

## 2013-08-26 DIAGNOSIS — R630 Anorexia: Secondary | ICD-10-CM | POA: Insufficient documentation

## 2013-08-26 DIAGNOSIS — Z8709 Personal history of other diseases of the respiratory system: Secondary | ICD-10-CM | POA: Diagnosis not present

## 2013-08-26 DIAGNOSIS — F329 Major depressive disorder, single episode, unspecified: Secondary | ICD-10-CM | POA: Insufficient documentation

## 2013-08-26 DIAGNOSIS — Z9089 Acquired absence of other organs: Secondary | ICD-10-CM | POA: Insufficient documentation

## 2013-08-26 DIAGNOSIS — R109 Unspecified abdominal pain: Secondary | ICD-10-CM

## 2013-08-26 DIAGNOSIS — R1012 Left upper quadrant pain: Secondary | ICD-10-CM | POA: Diagnosis not present

## 2013-08-26 DIAGNOSIS — K2990 Gastroduodenitis, unspecified, without bleeding: Secondary | ICD-10-CM | POA: Diagnosis not present

## 2013-08-26 DIAGNOSIS — R1011 Right upper quadrant pain: Secondary | ICD-10-CM | POA: Insufficient documentation

## 2013-08-26 DIAGNOSIS — Z853 Personal history of malignant neoplasm of breast: Secondary | ICD-10-CM | POA: Diagnosis not present

## 2013-08-26 DIAGNOSIS — R509 Fever, unspecified: Secondary | ICD-10-CM | POA: Diagnosis not present

## 2013-08-26 DIAGNOSIS — N281 Cyst of kidney, acquired: Secondary | ICD-10-CM | POA: Diagnosis not present

## 2013-08-26 DIAGNOSIS — Z8669 Personal history of other diseases of the nervous system and sense organs: Secondary | ICD-10-CM | POA: Insufficient documentation

## 2013-08-26 DIAGNOSIS — R110 Nausea: Secondary | ICD-10-CM | POA: Diagnosis not present

## 2013-08-26 DIAGNOSIS — N200 Calculus of kidney: Secondary | ICD-10-CM | POA: Diagnosis not present

## 2013-08-26 DIAGNOSIS — F3289 Other specified depressive episodes: Secondary | ICD-10-CM | POA: Diagnosis not present

## 2013-08-26 DIAGNOSIS — I10 Essential (primary) hypertension: Secondary | ICD-10-CM | POA: Insufficient documentation

## 2013-08-26 DIAGNOSIS — Z87891 Personal history of nicotine dependence: Secondary | ICD-10-CM | POA: Diagnosis not present

## 2013-08-26 DIAGNOSIS — K2970 Gastritis, unspecified, without bleeding: Secondary | ICD-10-CM | POA: Diagnosis not present

## 2013-08-26 DIAGNOSIS — E039 Hypothyroidism, unspecified: Secondary | ICD-10-CM | POA: Insufficient documentation

## 2013-08-26 DIAGNOSIS — IMO0002 Reserved for concepts with insufficient information to code with codable children: Secondary | ICD-10-CM

## 2013-08-26 LAB — URINALYSIS, ROUTINE W REFLEX MICROSCOPIC
Bilirubin Urine: NEGATIVE
Glucose, UA: NEGATIVE mg/dL
Hgb urine dipstick: NEGATIVE
Ketones, ur: NEGATIVE mg/dL
Nitrite: NEGATIVE
PH: 8 (ref 5.0–8.0)
PROTEIN: NEGATIVE mg/dL
Specific Gravity, Urine: 1.008 (ref 1.005–1.030)
Urobilinogen, UA: 0.2 mg/dL (ref 0.0–1.0)

## 2013-08-26 LAB — CBC WITH DIFFERENTIAL/PLATELET
Basophils Absolute: 0 10*3/uL (ref 0.0–0.1)
Basophils Relative: 0 % (ref 0–1)
EOS ABS: 0.1 10*3/uL (ref 0.0–0.7)
EOS PCT: 1 % (ref 0–5)
HEMATOCRIT: 43.6 % (ref 36.0–46.0)
Hemoglobin: 15 g/dL (ref 12.0–15.0)
Lymphocytes Relative: 19 % (ref 12–46)
Lymphs Abs: 1.4 10*3/uL (ref 0.7–4.0)
MCH: 31.3 pg (ref 26.0–34.0)
MCHC: 34.4 g/dL (ref 30.0–36.0)
MCV: 91 fL (ref 78.0–100.0)
Monocytes Absolute: 0.5 10*3/uL (ref 0.1–1.0)
Monocytes Relative: 7 % (ref 3–12)
Neutro Abs: 5.5 10*3/uL (ref 1.7–7.7)
Neutrophils Relative %: 73 % (ref 43–77)
Platelets: 153 10*3/uL (ref 150–400)
RBC: 4.79 MIL/uL (ref 3.87–5.11)
RDW: 13.1 % (ref 11.5–15.5)
WBC: 7.6 10*3/uL (ref 4.0–10.5)

## 2013-08-26 LAB — COMPREHENSIVE METABOLIC PANEL
ALBUMIN: 3.3 g/dL — AB (ref 3.5–5.2)
ALT: 15 U/L (ref 0–35)
AST: 16 U/L (ref 0–37)
Alkaline Phosphatase: 72 U/L (ref 39–117)
BUN: 14 mg/dL (ref 6–23)
CALCIUM: 8.8 mg/dL (ref 8.4–10.5)
CO2: 25 mEq/L (ref 19–32)
CREATININE: 0.8 mg/dL (ref 0.50–1.10)
Chloride: 97 mEq/L (ref 96–112)
GFR calc Af Amer: 79 mL/min — ABNORMAL LOW (ref >90)
GFR calc non Af Amer: 68 mL/min — ABNORMAL LOW (ref >90)
Glucose, Bld: 101 mg/dL — ABNORMAL HIGH (ref 70–99)
Potassium: 3.5 mEq/L — ABNORMAL LOW (ref 3.7–5.3)
Sodium: 139 mEq/L (ref 137–147)
TOTAL PROTEIN: 6.4 g/dL (ref 6.0–8.3)
Total Bilirubin: 0.8 mg/dL (ref 0.3–1.2)

## 2013-08-26 LAB — I-STAT CG4 LACTIC ACID, ED: Lactic Acid, Venous: 1.11 mmol/L (ref 0.5–2.2)

## 2013-08-26 LAB — LIPASE, BLOOD: Lipase: 21 U/L (ref 11–59)

## 2013-08-26 LAB — URINE MICROSCOPIC-ADD ON

## 2013-08-26 MED ORDER — FENTANYL CITRATE 0.05 MG/ML IJ SOLN
50.0000 ug | Freq: Once | INTRAMUSCULAR | Status: AC
  Administered 2013-08-26: 50 ug via INTRAVENOUS
  Filled 2013-08-26: qty 2

## 2013-08-26 MED ORDER — SENNOSIDES-DOCUSATE SODIUM 8.6-50 MG PO TABS: 2.0000 | ORAL_TABLET | Freq: Every day | ORAL | Status: DC

## 2013-08-26 MED ORDER — SODIUM CHLORIDE 0.9 % IV SOLN
Freq: Once | INTRAVENOUS | Status: AC
  Administered 2013-08-26: 10:00:00 via INTRAVENOUS

## 2013-08-26 MED ORDER — DICYCLOMINE HCL 20 MG PO TABS: 20.0000 mg | ORAL_TABLET | Freq: Two times a day (BID) | ORAL | Status: DC

## 2013-08-26 NOTE — ED Provider Notes (Signed)
CSN: [id number]     Arrival date & time 08/26/13  0932 History   First MD Initiated Contact with Patient 08/26/13 [phone]     Chief Complaint  Patient presents with  . Abdominal Pain  . Nausea     (Consider location/radiation/quality/duration/timing/severity/associated sxs/prior Treatment) HPI Patient presents with concern of abdominal pain, diarrhea, nausea, fever. Patient was discharged from this facility 4 days ago after an admission for bronchitis. She notes that since discharge she has been taking antibiotics, sorbitol. Over the past days she developed increasing pain in the upper abdomen.  Pain associated with nausea, anorexia, burning sensation. Patient denies vomiting, states that she has had innumerable episodes of loose stool. She denies other chest pain, no dyspnea, cough. Minimal relief with anything. No confusion, no disorientation, no other focal complaints.  Past Medical History  Diagnosis Date  . Morbid obesity   . MIGRAINE HEADACHE   . ARTHRITIS, KNEES, BILATERAL   . DEPRESSION   . GERD   . HYPERLIPIDEMIA   . HYPERTENSION   . Breast CA 2000    s/p R lumpectomy and XRT  . DJD (degenerative joint disease) of knee   . Urge incontinence   . HYPOTHYROIDISM     postsurgical  . OSA (obstructive sleep apnea) 01/17/2011 dx   Past Surgical History  Procedure Laterality Date  . Cholecystectomy  1995  . Breast lumpectomy  2000  . Thyroidectomy    . Breast biopsy  2000  . Replacement total knee bilateral  1999, 2005  . Tubal ligation    . Parathyroidectomy      2-3 removed  . Tonsillectomy    . Adenoidectomy    . Right leg femur repaired     Family History  Problem Relation Age of Onset  . Emphysema Sister   . Coronary artery disease Neg Hx    History  Substance Use Topics  . Smoking status: Former Smoker -- 0.25 packs/day for 2 years    Types: Cigarettes    Quit date: 06/19/1974  . Smokeless tobacco: Never Used     Comment: Quit in late 20's  . Alcohol  Use: No     Comment: rare   OB History   Grav Para Term Preterm Abortions TAB SAB Ect Mult Living                 Review of Systems  Constitutional:       Per HPI, otherwise negative  HENT:       Per HPI, otherwise negative  Respiratory:       Per HPI, otherwise negative  Cardiovascular:       Per HPI, otherwise negative  Gastrointestinal: Positive for abdominal pain and diarrhea. Negative for vomiting and blood in stool.  Endocrine:       Negative aside from HPI  Genitourinary:       Neg aside from HPI   Musculoskeletal:       Per HPI, otherwise negative  Skin: Negative.   Neurological: Negative for syncope.      Allergies  Azithromycin; Ciprofloxacin; Codeine; Epinephrine; Klonopin; Oxycodone; Paxil; Propoxyphene hcl; and Meloxicam  Home Medications   Current Outpatient Rx  Name  Route  Sig  Dispense  Refill  . albuterol-ipratropium (COMBIVENT) 18-103 MCG/ACT inhaler   Inhalation   Inhale 2 puffs into the lungs every 6 (six) hours as needed for wheezing or shortness of breath.          [name] aspirin 81 MG tablet  Oral   Take 81 mg by mouth every morning.          . citalopram (CELEXA) 10 MG tablet   Oral   Take 10 mg by mouth as needed (anxiety).         [name] diphenhydrAMINE (BENADRYL) 25 mg capsule   Oral   Take 25 mg by mouth every 6 (six) hours as needed for sleep.         [name] esomeprazole (NEXIUM) 40 MG capsule   Oral   Take 1 capsule (40 mg total) by mouth daily.   90 capsule   3   . FIBER PO   Oral   Take 2 tablets by mouth every morning.          [name] levothyroxine (SYNTHROID, LEVOTHROID) 137 MCG tablet   Oral   Take 137 mcg by mouth daily before breakfast. Takes Brand name Synthroid         . loratadine (CLARITIN) 10 MG tablet   Oral   Take 10 mg by mouth daily as needed for allergies.          . Olmesartan-Amlodipine-HCTZ (TRIBENZOR) 20-5-12.5 MG TABS   Oral   Take 1 tablet by mouth every morning.          . Omega-3 Fatty  Acids (FISH OIL) 1200 MG CAPS   Oral   Take 2 capsules by mouth every morning.          . pantoprazole (PROTONIX) 20 MG tablet   Oral   Take 20 mg by mouth every morning.          . phenazopyridine (AZO-TABS) 95 MG tablet   Oral   Take 95 mg by mouth daily as needed (for urinary pain).          . potassium chloride (K-DUR,KLOR-CON) 10 MEQ tablet   Oral   Take 20 mEq by mouth daily.         . sorbitol 70 % solution   Oral   Take 15 mLs by mouth daily as needed (constipation).         [name] zolpidem (AMBIEN) 5 MG tablet   Oral   Take 5 mg by mouth at bedtime as needed for sleep.          BP 148/67  Pulse 68  Temp(Src) 97.5 F (36.4 C) (Oral)  Resp 16  SpO2 96% Physical Exam  Nursing note and vitals reviewed. Constitutional: She is oriented to person, place, and time. She appears well-developed and well-nourished. No distress.  Obese F resting in bed - speaking clearly - though she appears uncomfortable.  HENT:  Head: Normocephalic and atraumatic.  Eyes: Conjunctivae and EOM are normal.  Cardiovascular: Normal rate and regular rhythm.   Pulmonary/Chest: Effort normal and breath sounds normal. No stridor. No respiratory distress.  Abdominal: She exhibits no distension. There is tenderness in the right upper quadrant, epigastric area and left upper quadrant. There is guarding. There is no rigidity and no rebound.  Musculoskeletal: She exhibits no edema.  Neurological: She is alert and oriented to person, place, and time. No cranial nerve deficit.  Skin: Skin is warm and dry.  Psychiatric: She has a normal mood and affect.    ED Course  Procedures (including critical care time) Labs Review Labs Reviewed  COMPREHENSIVE METABOLIC PANEL - Abnormal; Notable for the following:    Potassium 3.5 (*)    Glucose, Bld 101 (*)    Albumin 3.3 (*)    GFR  calc non Af Amer 68 (*)    GFR calc Af Amer 79 (*)    All other components within normal limits  URINALYSIS, ROUTINE  W REFLEX MICROSCOPIC - Abnormal; Notable for the following:    Leukocytes, UA SMALL (*)    All other components within normal limits  CBC WITH DIFFERENTIAL  LIPASE, BLOOD  URINE MICROSCOPIC-ADD ON  I-STAT CG4 LACTIC ACID, ED   Imaging Review Ct Abdomen Pelvis Wo Contrast  08/26/2013   CLINICAL DATA:  Right upper quadrant pain .  EXAM: CT ABDOMEN AND PELVIS WITHOUT CONTRAST  TECHNIQUE: Multidetector CT imaging of the abdomen and pelvis was performed following the standard protocol without intravenous contrast.  COMPARISON:  DG ABD PORTABLE 1V dated 08/22/2013; CT ABD/PELV WO CM dated 03/20/2013; CT ANGIO CHEST W/CM &/OR WO/CM dated 01/02/2011  FINDINGS: Liver normal. Stable splenic lesion with calcification, most likely benign vascular lesion. Pancreas is normal. Cholecystectomy. No biliary distention.  Adrenals normal. Bilateral prominent parapelvic cysts are noted. Nonobstructing right nephrolithiasis. Simple cysts both kidneys. No hydronephrosis. Multiple calcifications in the pelvis consistent phleboliths. Nonobstructing distal ureteral stones cannot be excluded. Bladder is nondistended. Hysterectomy. Adnexal regions are stable. No free pelvic fluid.  No adenopathy.  Aorta normal caliber.  Appendix normal. No bowel distention. Extensive diverticulosis, no evidence of diverticulitis. No free air. No mesenteric masses. Umbilical hernia with herniation of fat only.  Borderline cardiomegaly. Mild pericardial thickening versus pericardial effusion. Mild atelectasis lung bases. Severe degenerative changes lumbar spine. Severe scoliosis lumbar spine.  IMPRESSION: 1. No acute abnormality. 2. Bilateral prominent renal parapelvic cysts. Nonobstructing right nephrolithiasis. Multiple pinpoint calcifications are noted in the pelvis. These are most likely phleboliths. Nonobstructing distal ureteral stones can not be excluded. No hydroureter. 3. Extensive diverticulosis.  No evidence of diverticulitis.   Electronically  Signed   By: Marcello Moores  Register   On: 08/26/2013 12:33    I reviewed the EMR  1:34 PM I reviewed the CT images with the patient and her family member.  I agree with the interpretation. We had a lengthy conversation about the patient's results, including the absence of notable findings on CT scan, the reassuring labs. The patient currently denies any pain, and appears comfortable.  With no ongoing complaints, stable vitals and results she'll be discharged. MDM   Final diagnoses:  Abdominal pain    This elderly female presents with concerns of ongoing upper abdominal pain, occasional diarrhea.  Per chart review the patient has had chronic diarrhea for some time, and the patient herself stresses the importance of the abdominal pain as her primary concern.  On exam she is awake, alert, with a soft, no peritoneal abdomen, though she is mild tenderness to palpation.  Patient's labs, CT scan are reassuring.  Patient is afebrile, hemodynamically stable.  The patient was started on a new medication regimen, and I emphasized the importance of stopping current antibiotic therapy.  Patient is appropriate for discharge, with both primary care and gastroenterology as needed.    Carmin Muskrat, MD 08/26/13 1336

## 2013-08-26 NOTE — ED Notes (Signed)
Bed: UV25 Expected date:  Expected time:  Means of arrival:  Comments: NV

## 2013-08-26 NOTE — ED Notes (Signed)
Per EMS pt coming from [facility] assisted living facility with c/o nausea, diarrhea and fever x 4 days. Per EMS pt reports taking antibiotics for bronchitis which she stopped taking yesterday.

## 2013-08-26 NOTE — Discharge Instructions (Signed)
As discussed, your evaluation today has been largely reassuring.  But, it is important that you monitor your condition carefully, and do not hesitate to return to the ED if you develop new, or concerning changes in your condition.  Otherwise, please follow-up with your physician for appropriate ongoing care.    Abdominal Pain, Adult Many things can cause abdominal pain. Usually, abdominal pain is not caused by a disease and will improve without treatment. It can often be observed and treated at home. Your health care provider will do a physical exam and possibly order blood tests and X-rays to help determine the seriousness of your pain. However, in many cases, more time must pass before a clear cause of the pain can be found. Before that point, your health care provider may not know if you need more testing or further treatment. HOME CARE INSTRUCTIONS  Monitor your abdominal pain for any changes. The following actions may help to alleviate any discomfort you are experiencing:  Only take over-the-counter or prescription medicines as directed by your health care provider.  Do not take laxatives unless directed to do so by your health care provider.  Try a clear liquid diet (broth, tea, or water) as directed by your health care provider. Slowly move to a bland diet as tolerated. SEEK MEDICAL CARE IF:  You have unexplained abdominal pain.  You have abdominal pain associated with nausea or diarrhea.  You have pain when you urinate or have a bowel movement.  You experience abdominal pain that wakes you in the night.  You have abdominal pain that is worsened or improved by eating food.  You have abdominal pain that is worsened with eating fatty foods. SEEK IMMEDIATE MEDICAL CARE IF:   Your pain does not go away within 2 hours.  You have a fever.  You keep throwing up (vomiting).  Your pain is felt only in portions of the abdomen, such as the right side or the left lower portion of the  abdomen.  You pass bloody or black tarry stools. MAKE SURE YOU:  Understand these instructions.   Will watch your condition.   Will get help right away if you are not doing well or get worse.  Document Released: 03/15/2005 Document Revised: 03/26/2013 Document Reviewed: 02/12/2013 [facility] Patient Information 2014 [place].

## 2013-08-27 ENCOUNTER — Telehealth: Payer: Self-pay | Admitting: General Practice

## 2013-08-27 NOTE — Telephone Encounter (Signed)
1st attempt at transitional care call.  No answer.  Will try again.

## 2013-08-27 NOTE — Telephone Encounter (Signed)
1st attempt at Transitional care call.  No answer and VM not available.  Will try again.

## 2013-08-28 LAB — CULTURE, BLOOD (ROUTINE X 2)
Culture: NO GROWTH
Culture: NO GROWTH

## 2013-08-29 ENCOUNTER — Encounter: Payer: Self-pay | Admitting: Internal Medicine

## 2013-08-29 ENCOUNTER — Ambulatory Visit (INDEPENDENT_AMBULATORY_CARE_PROVIDER_SITE_OTHER): Payer: Medicare Other | Admitting: Internal Medicine

## 2013-08-29 VITALS — BP 120/78 | HR 90 | Temp 97.2°F | Wt 287.6 lb

## 2013-08-29 DIAGNOSIS — R109 Unspecified abdominal pain: Secondary | ICD-10-CM

## 2013-08-29 DIAGNOSIS — G4733 Obstructive sleep apnea (adult) (pediatric): Secondary | ICD-10-CM | POA: Diagnosis not present

## 2013-08-29 MED ORDER — SENNOSIDES-DOCUSATE SODIUM 8.6-50 MG PO TABS: 2.0000 | ORAL_TABLET | Freq: Every evening | ORAL | Status: DC | PRN

## 2013-08-29 MED ORDER — DICYCLOMINE HCL 20 MG PO TABS: 20.0000 mg | ORAL_TABLET | Freq: Two times a day (BID) | ORAL | Status: DC | PRN

## 2013-08-29 NOTE — Assessment & Plan Note (Signed)
Wt Readings from Last 3 Encounters:  08/29/13 287 lb 9.6 oz (130.455 kg)  08/21/13 289 lb 11 oz (131.4 kg)  05/26/13 296 lb 9.6 oz (134.537 kg)   The patient is asked to make an attempt to improve diet and exercise patterns to aid in medical management of this problem.

## 2013-08-29 NOTE — Assessment & Plan Note (Signed)
Compliance with nocturnal therapy. Continue CPAP with oxygen each bedtime Followup with sleep and annually as needed

## 2013-08-29 NOTE — Progress Notes (Signed)
Pre visit review using our clinic review tool, if applicable. No additional management support is needed unless otherwise documented below in the visit note. 

## 2013-08-29 NOTE — Patient Instructions (Signed)
It was good to see you today.  We have reviewed your prior records including labs and tests today  Medications reviewed and updated, no specific changes recommended at this time. Refill on medication(s) as discussed today.  we'll make referral to gastroenterology if or as needed.  Please schedule followup in 3-4 months, call sooner if problems.

## 2013-08-29 NOTE — Progress Notes (Signed)
Subjective:    Patient ID: Tamara Morales, female    DOB: July 07, 1930, 80 y.o.   MRN: [id number]  HPI  Here for follow up - reviewed chronic medical issues and interval medical events:  Admit date: 08/21/2013  Discharge date: 08/23/2013  Time spent: 35 minutes  Recommendations for Outpatient Follow-up:  1. Follow up with PCP in 1-2 weeks Discharge Diagnoses:  Principal Problem:  Acute bronchitis  Active Problems:  HYPOTHYROIDISM  Morbid obesity  DEPRESSION  HYPERTENSION  Diarrhea  Discharge Condition: Improved  Diet recommendation: High fiber  ER evaluation 3/8 and 3/10 for abd cramping - resolved with bentyl and senna plus   Past Medical History  Diagnosis Date  . Morbid obesity   . MIGRAINE HEADACHE   . ARTHRITIS, KNEES, BILATERAL   . DEPRESSION   . GERD   . HYPERLIPIDEMIA   . HYPERTENSION   . Breast CA 2000    s/p R lumpectomy and XRT  . DJD (degenerative joint disease) of knee   . Urge incontinence   . HYPOTHYROIDISM     postsurgical  . OSA (obstructive sleep apnea) 01/17/2011 dx    Review of Systems  Constitutional: Positive for fatigue. Negative for fever.  Respiratory: Negative for cough and shortness of breath.   Cardiovascular: Negative for chest pain and leg swelling.  Gastrointestinal: Negative for nausea, abdominal pain, diarrhea, constipation, blood in stool and abdominal distention.       Objective:   Physical Exam BP 120/78  Pulse 90  Temp(Src) 97.2 F (36.2 C) (Oral)  Wt 287 lb 9.6 oz (130.455 kg)  SpO2 98% Wt Readings from Last 3 Encounters:  08/29/13 287 lb 9.6 oz (130.455 kg)  08/21/13 289 lb 11 oz (131.4 kg)  05/26/13 296 lb 9.6 oz (134.537 kg)   Constitutional: She is obese; appears well-developed and well-nourished.  Neck: Normal range of motion. Neck supple. No JVD present. No thyromegaly present.  Cardiovascular: Normal rate, regular rhythm and normal heart sounds.  No murmur heard. trace edema BLE. Pulmonary/Chest: Effort  normal. breath sounds normal without rhonchi or wheeze.  Abdomen:  obese, SNTND, +BS - no r/g Psychiatric: She has a normal mood and affect. Her behavior is normal. Judgment and thought content normal.   Lab Results  Component Value Date   WBC 7.6 08/26/2013   HGB 15.0 08/26/2013   HCT 43.6 08/26/2013   PLT 153 08/26/2013   CHOL 196 02/28/2013   TRIG 148.0 02/28/2013   HDL 40.60 02/28/2013   LDLDIRECT 99.4 03/13/2012   ALT 15 08/26/2013   AST 16 08/26/2013   NA 139 08/26/2013   K 3.5* 08/26/2013   CL 97 08/26/2013   CREATININE 0.80 08/26/2013   BUN 14 08/26/2013   CO2 25 08/26/2013   TSH 4.33 02/28/2013   INR 1.02 03/20/2010   HGBA1C 6.2 05/21/2013   Lab Results  Component Value Date   PTH 85.2* 05/21/2013   CALCIUM 8.8 08/26/2013   CAION 1.08* 07/30/2011   PHOS 2.9 08/21/2013        Assessment & Plan:   Pain and cramping. Suspect constipation with IBS flare. Symptoms have improved with treatment of constipation and spasms. Continue Bentyl as needed and senna plus each bedtime as needed -patient declines the for GI at this time as symptoms have resolved  Hospitalization for acute bronchitis early March 2015. Primary symptoms have resolved. No longer on pulmonary treatment. Questioned whether chronic bronchitis. Has no active symptoms, deferred need for further pulmonary valuation of this  time  Problem List Items Addressed This Visit   Morbid obesity      Wt Readings from Last 3 Encounters:  08/29/13 287 lb 9.6 oz (130.455 kg)  08/21/13 289 lb 11 oz (131.4 kg)  05/26/13 296 lb 9.6 oz (134.537 kg)   The patient is asked to make an attempt to improve diet and exercise patterns to aid in medical management of this problem.     OSA (obstructive sleep apnea)     Compliance with nocturnal therapy. Continue CPAP with oxygen each bedtime Followup with sleep and annually as needed     Other Visit Diagnoses   Abdominal pain, unspecified site    -  Primary      Time spent with pt today 25  minutes, greater than 50% time spent counseling patient on hospitalization for bronchitis, emergency room visit twice for abdominal pain and medication review. Also review of prior records

## 2013-09-01 ENCOUNTER — Telehealth: Payer: Self-pay | Admitting: Pulmonary Disease

## 2013-09-01 NOTE — Telephone Encounter (Signed)
atc na x1 

## 2013-09-01 NOTE — Telephone Encounter (Signed)
She needs to d/w her DME about trying a different non-allergenic mask.  She can also try using benadryl 50 mg to help with rash and itching.  She should call back if rash does not improve.

## 2013-09-01 NOTE — Telephone Encounter (Signed)
Pt returned call

## 2013-09-01 NOTE — Telephone Encounter (Signed)
LMTC x 1  

## 2013-09-01 NOTE — Telephone Encounter (Signed)
Pt aware of recs.  Nothing further needed. 

## 2013-09-01 NOTE — Telephone Encounter (Signed)
Spoke with pt. She reports a red rash on her face where the CPAP mask goes. Rash also itches. Has recently change cushion but didn't wash it before putting in her mask. No OTC medications have been taken or applied to rash. Would like VS recs.  VS - please advise. Thanks.

## 2013-09-05 ENCOUNTER — Telehealth: Payer: Self-pay

## 2013-09-05 ENCOUNTER — Emergency Department (HOSPITAL_COMMUNITY)
Admission: EM | Admit: 2013-09-05 | Discharge: 2013-09-06 | Disposition: A | Discharge status: Home / Self Care | Payer: Medicare Other | Attending: Dermatology | Admitting: Dermatology

## 2013-09-05 ENCOUNTER — Encounter (HOSPITAL_COMMUNITY): Payer: Self-pay | Admitting: Emergency Medicine

## 2013-09-05 DIAGNOSIS — Z79899 Other long term (current) drug therapy: Secondary | ICD-10-CM | POA: Insufficient documentation

## 2013-09-05 DIAGNOSIS — I10 Essential (primary) hypertension: Secondary | ICD-10-CM | POA: Insufficient documentation

## 2013-09-05 DIAGNOSIS — Z853 Personal history of malignant neoplasm of breast: Secondary | ICD-10-CM | POA: Diagnosis not present

## 2013-09-05 DIAGNOSIS — E039 Hypothyroidism, unspecified: Secondary | ICD-10-CM | POA: Insufficient documentation

## 2013-09-05 DIAGNOSIS — K219 Gastro-esophageal reflux disease without esophagitis: Secondary | ICD-10-CM | POA: Diagnosis not present

## 2013-09-05 DIAGNOSIS — F39 Unspecified mood [affective] disorder: Secondary | ICD-10-CM | POA: Insufficient documentation

## 2013-09-05 DIAGNOSIS — G4733 Obstructive sleep apnea (adult) (pediatric): Secondary | ICD-10-CM | POA: Diagnosis not present

## 2013-09-05 DIAGNOSIS — F3289 Other specified depressive episodes: Secondary | ICD-10-CM | POA: Diagnosis not present

## 2013-09-05 DIAGNOSIS — Z87891 Personal history of nicotine dependence: Secondary | ICD-10-CM | POA: Insufficient documentation

## 2013-09-05 DIAGNOSIS — M1710 Unilateral primary osteoarthritis, unspecified knee: Secondary | ICD-10-CM | POA: Insufficient documentation

## 2013-09-05 DIAGNOSIS — F329 Major depressive disorder, single episode, unspecified: Secondary | ICD-10-CM | POA: Diagnosis not present

## 2013-09-05 DIAGNOSIS — F419 Anxiety disorder, unspecified: Secondary | ICD-10-CM

## 2013-09-05 DIAGNOSIS — Z7982 Long term (current) use of aspirin: Secondary | ICD-10-CM | POA: Insufficient documentation

## 2013-09-05 DIAGNOSIS — F411 Generalized anxiety disorder: Secondary | ICD-10-CM | POA: Insufficient documentation

## 2013-09-05 DIAGNOSIS — IMO0002 Reserved for concepts with insufficient information to code with codable children: Secondary | ICD-10-CM | POA: Insufficient documentation

## 2013-09-05 LAB — COMPREHENSIVE METABOLIC PANEL
ALT: 12 U/L (ref 0–35)
AST: 13 U/L (ref 0–37)
Albumin: 3.6 g/dL (ref 3.5–5.2)
Alkaline Phosphatase: 74 U/L (ref 39–117)
BILIRUBIN TOTAL: 0.7 mg/dL (ref 0.3–1.2)
BUN: 9 mg/dL (ref 6–23)
CALCIUM: 8.8 mg/dL (ref 8.4–10.5)
CHLORIDE: 99 meq/L (ref 96–112)
CO2: 25 meq/L (ref 19–32)
CREATININE: 0.8 mg/dL (ref 0.50–1.10)
GFR, EST AFRICAN AMERICAN: 79 mL/min — AB (ref >90)
GFR, EST NON AFRICAN AMERICAN: 68 mL/min — AB (ref >90)
Glucose, Bld: 98 mg/dL (ref 70–99)
Potassium: 3.2 mEq/L — ABNORMAL LOW (ref 3.7–5.3)
Sodium: 141 mEq/L (ref 137–147)
Total Protein: 6.8 g/dL (ref 6.0–8.3)

## 2013-09-05 LAB — CBC
HEMATOCRIT: 42.1 % (ref 36.0–46.0)
Hemoglobin: 14.3 g/dL (ref 12.0–15.0)
MCH: 31 pg (ref 26.0–34.0)
MCHC: 34 g/dL (ref 30.0–36.0)
MCV: 91.3 fL (ref 78.0–100.0)
Platelets: 162 10*3/uL (ref 150–400)
RBC: 4.61 MIL/uL (ref 3.87–5.11)
RDW: 13.5 % (ref 11.5–15.5)
WBC: 7.2 10*3/uL (ref 4.0–10.5)

## 2013-09-05 LAB — URINALYSIS, ROUTINE W REFLEX MICROSCOPIC
Bilirubin Urine: NEGATIVE
Glucose, UA: NEGATIVE mg/dL
Hgb urine dipstick: NEGATIVE
Ketones, ur: NEGATIVE mg/dL
Leukocytes, UA: NEGATIVE
NITRITE: NEGATIVE
PROTEIN: NEGATIVE mg/dL
SPECIFIC GRAVITY, URINE: 1.017 (ref 1.005–1.030)
UROBILINOGEN UA: 0.2 mg/dL (ref 0.0–1.0)
pH: 7.5 (ref 5.0–8.0)

## 2013-09-05 LAB — RAPID URINE DRUG SCREEN, HOSP PERFORMED
Amphetamines: NOT DETECTED
Barbiturates: NOT DETECTED
Benzodiazepines: NOT DETECTED
COCAINE: NOT DETECTED
OPIATES: NOT DETECTED
Tetrahydrocannabinol: NOT DETECTED

## 2013-09-05 LAB — ETHANOL: Alcohol, Ethyl (B): <11 mg/dL (ref 0–11)

## 2013-09-05 MED ORDER — ALPRAZOLAM 0.25 MG PO TABS
0.2500 mg | ORAL_TABLET | Freq: Once | ORAL | Status: AC
  Administered 2013-09-05: 0.25 mg via ORAL
  Filled 2013-09-05: qty 1

## 2013-09-05 MED ORDER — ZOLPIDEM TARTRATE 5 MG PO TABS
5.0000 mg | ORAL_TABLET | Freq: Every evening | ORAL | Status: DC | PRN
  Administered 2013-09-06: 5 mg via ORAL
  Filled 2013-09-05: qty 1

## 2013-09-05 MED ORDER — LEVOTHYROXINE SODIUM 137 MCG PO TABS
137.0000 ug | ORAL_TABLET | Freq: Every day | ORAL | Status: DC
  Administered 2013-09-06: 137 ug via ORAL
  Filled 2013-09-05 (×2): qty 1

## 2013-09-05 MED ORDER — POTASSIUM CHLORIDE CRYS ER 20 MEQ PO TBCR
20.0000 meq | EXTENDED_RELEASE_TABLET | Freq: Every day | ORAL | Status: DC
  Administered 2013-09-06: 20 meq via ORAL
  Filled 2013-09-05: qty 1

## 2013-09-05 MED ORDER — PANTOPRAZOLE SODIUM 40 MG PO TBEC
40.0000 mg | DELAYED_RELEASE_TABLET | Freq: Every day | ORAL | Status: DC
  Administered 2013-09-06: 40 mg via ORAL
  Filled 2013-09-05: qty 1

## 2013-09-05 MED ORDER — POTASSIUM CHLORIDE CRYS ER 20 MEQ PO TBCR
40.0000 meq | EXTENDED_RELEASE_TABLET | Freq: Once | ORAL | Status: AC
  Administered 2013-09-05: 40 meq via ORAL
  Filled 2013-09-05: qty 2

## 2013-09-05 MED ORDER — IPRATROPIUM-ALBUTEROL 18-103 MCG/ACT IN AERO: 2.0000 | INHALATION_SPRAY | Freq: Four times a day (QID) | RESPIRATORY_TRACT | Status: DC | PRN

## 2013-09-05 MED ORDER — OLMESARTAN-AMLODIPINE-HCTZ 20-5-12.5 MG PO TABS: 1.0000 | ORAL_TABLET | Freq: Every morning | ORAL | Status: DC

## 2013-09-05 MED ORDER — ASPIRIN EC 81 MG PO TBEC
81.0000 mg | DELAYED_RELEASE_TABLET | Freq: Every morning | ORAL | Status: DC
  Administered 2013-09-06: 81 mg via ORAL
  Filled 2013-09-05: qty 1

## 2013-09-05 NOTE — ED Notes (Signed)
On assessment patient tearful and states she feels like she is jumping out her skin. Patient states she felt suicidal at times. At this current time patient states she's not but it comes in waves.

## 2013-09-05 NOTE — Telephone Encounter (Signed)
This should get better over the next few days, if it doesn't then seek treatment

## 2013-09-05 NOTE — ED Notes (Signed)
Pt reports staying in hospital a couple of weeks ago and being started on prednisone. Was seen in ER twice, was taken off prednisone on 3/8 and sts she has been feeling jittery, restless and unable to focus and "almost suicidal" since going home that day. She believes it is related to the prednisone and is a reaction. Sts it comes in waves and feels like a huge anxiety attack. Pt also reports being very thirsty and urinating a lot and being concerned she has "washed out" a lot of potassium.

## 2013-09-05 NOTE — ED Provider Notes (Signed)
CSN: [id number]     Arrival date & time 09/05/13  1723 History   First MD Initiated Contact with Patient 09/05/13 1912     Chief Complaint  Patient presents with  . Anxiety     (Consider location/radiation/quality/duration/timing/severity/associated sxs/prior Treatment) Patient is a 80 y.o. female presenting with anxiety. The history is provided by the patient.  Anxiety Pertinent negatives include no chest pain, no abdominal pain, no headaches and no shortness of breath.  pt states is feeling very anxious, restless at times for the past few weeks. States she has been seen in ED and w pcp w similar symptoms. Pt indicates she had been treated for bronchitis, and was placed on pred for few days which she feels like made her feel worse. She has been off prednisone for a week, but feels no better.  Pt denies feeling depressed, other than feeling depressed about feeling so anxious all the time. Pt denies prior hx anxiety or depression, but is noted to be on celexa, and previously on klonopin.  Pt states for 'years', she took celexa on prn basis, if felt overwhelmed or had many stressors.  States in past month, has been taken celexa less often than normal. Pt states compliant w other meds, denies other change in meds.   Pt states trouble sleeping at night, decreased appetite.  Expressed transient SI to family member earlier, currently denies. States physical health seems at baseline. Former cough improved. Feels breathing at baseline. No cp. No abd pain or nvd. No fever or chills.     Past Medical History  Diagnosis Date  . Morbid obesity   . MIGRAINE HEADACHE   . ARTHRITIS, KNEES, BILATERAL   . DEPRESSION   . GERD   . HYPERLIPIDEMIA   . HYPERTENSION   . Breast CA 2000    s/p R lumpectomy and XRT  . DJD (degenerative joint disease) of knee   . Urge incontinence   . HYPOTHYROIDISM     postsurgical  . OSA (obstructive sleep apnea) 01/17/2011 dx   Past Surgical History  Procedure Laterality  Date  . Cholecystectomy  1995  . Breast lumpectomy  2000  . Thyroidectomy    . Breast biopsy  2000  . Replacement total knee bilateral  1999, 2005  . Tubal ligation    . Parathyroidectomy      2-3 removed  . Tonsillectomy    . Adenoidectomy    . Right leg femur repaired     Family History  Problem Relation Age of Onset  . Emphysema Sister   . Coronary artery disease Neg Hx    History  Substance Use Topics  . Smoking status: Former Smoker -- 0.25 packs/day for 2 years    Types: Cigarettes    Quit date: 06/19/1974  . Smokeless tobacco: Never Used     Comment: Quit in late 20's  . Alcohol Use: No     Comment: rare   OB History   Grav Para Term Preterm Abortions TAB SAB Ect Mult Living                 Review of Systems  Constitutional: Negative for fever and chills.  HENT: Negative for sore throat.   Eyes: Negative for redness.  Respiratory: Negative for cough and shortness of breath.   Cardiovascular: Negative for chest pain.  Gastrointestinal: Negative for vomiting, abdominal pain and diarrhea.  Genitourinary: Negative for flank pain.  Musculoskeletal: Negative for back pain and neck pain.  Skin: Negative for  rash.  Neurological: Negative for headaches.  Hematological: Does not bruise/bleed easily.  Psychiatric/Behavioral: Positive for dysphoric mood. The patient is nervous/anxious.       Allergies  Azithromycin; Ciprofloxacin; Codeine; Epinephrine; Klonopin; Oxycodone; Paxil; Propoxyphene hcl; and Meloxicam  Home Medications   Current Outpatient Rx  Name  Route  Sig  Dispense  Refill  . albuterol-ipratropium (COMBIVENT) 18-103 MCG/ACT inhaler   Inhalation   Inhale 2 puffs into the lungs every 6 (six) hours as needed for wheezing or shortness of breath.          [name] aspirin 81 MG tablet   Oral   Take 81 mg by mouth every morning.          . citalopram (CELEXA) 10 MG tablet   Oral   Take 10 mg by mouth as needed (anxiety).         [name]  diphenhydrAMINE (BENADRYL) 25 mg capsule   Oral   Take 25 mg by mouth every 6 (six) hours as needed for sleep.         [name] esomeprazole (NEXIUM) 40 MG capsule   Oral   Take 1 capsule (40 mg total) by mouth daily.   90 capsule   3   . FIBER PO   Oral   Take 2 tablets by mouth every morning.          [name] levothyroxine (SYNTHROID, LEVOTHROID) 137 MCG tablet   Oral   Take 137 mcg by mouth daily before breakfast.          . loratadine (CLARITIN) 10 MG tablet   Oral   Take 10 mg by mouth daily as needed for allergies.          . Olmesartan-Amlodipine-HCTZ (TRIBENZOR) 20-5-12.5 MG TABS   Oral   Take 1 tablet by mouth every morning.          . Omega-3 Fatty Acids (FISH OIL) 1200 MG CAPS   Oral   Take 2 capsules by mouth every morning.          . potassium chloride (K-DUR,KLOR-CON) 10 MEQ tablet   Oral   Take 20 mEq by mouth daily.         [name] senna-docusate (SENOKOT-S) 8.6-50 MG per tablet   Oral   Take 2 tablets by mouth at bedtime as needed for mild constipation.   30 tablet   1   . zolpidem (AMBIEN) 5 MG tablet   Oral   Take 5 mg by mouth at bedtime as needed for sleep.         [name] EXPIRED: dicyclomine (BENTYL) 20 MG tablet   Oral   Take 1 tablet (20 mg total) by mouth 2 (two) times daily as needed for spasms.   40 tablet   0   . phenazopyridine (AZO-TABS) 95 MG tablet   Oral   Take 95 mg by mouth daily as needed (for urinary pain).           BP 157/82  Pulse 81  Temp(Src) 97.9 F (36.6 C) (Oral)  Resp 19  SpO2 97% Physical Exam  Nursing note and vitals reviewed. Constitutional: She appears well-developed and well-nourished. No distress.  HENT:  Nose: Nose normal.  Mouth/Throat: Oropharynx is clear and moist.  Eyes: Conjunctivae are normal. No scleral icterus.  Neck: Neck supple. No tracheal deviation present.  Cardiovascular: Normal rate, regular rhythm, normal heart sounds and intact distal pulses.   Pulmonary/Chest: Effort normal and  breath sounds normal. No respiratory distress.  Abdominal: Soft. Normal appearance and bowel sounds are normal. She exhibits no distension. There is no tenderness.  Genitourinary:  No cva tenderness  Musculoskeletal: She exhibits no edema and no tenderness.  Neurological: She is alert.  Skin: Skin is warm and dry. No rash noted.  Psychiatric:  Anxious. ?depressed mood. Denies SI.     ED Course  Procedures (including critical care time)  Results for orders placed during the hospital encounter of 09/05/13  CBC      Result Value Ref Range   WBC 7.2  4.0 - 10.5 K/uL   RBC 4.61  3.87 - 5.11 MIL/uL   Hemoglobin 14.3  12.0 - 15.0 g/dL   HCT 42.1  36.0 - 46.0 %   MCV 91.3  78.0 - 100.0 fL   MCH 31.0  26.0 - 34.0 pg   MCHC 34.0  30.0 - 36.0 g/dL   RDW 13.5  11.5 - 15.5 %   Platelets 162  150 - 400 K/uL  COMPREHENSIVE METABOLIC PANEL      Result Value Ref Range   Sodium 141  137 - 147 mEq/L   Potassium 3.2 (*) 3.7 - 5.3 mEq/L   Chloride 99  96 - 112 mEq/L   CO2 25  19 - 32 mEq/L   Glucose, Bld 98  70 - 99 mg/dL   BUN 9  6 - 23 mg/dL   Creatinine, Ser 0.80  0.50 - 1.10 mg/dL   Calcium 8.8  8.4 - 10.5 mg/dL   Total Protein 6.8  6.0 - 8.3 g/dL   Albumin 3.6  3.5 - 5.2 g/dL   AST 13  0 - 37 U/L   ALT 12  0 - 35 U/L   Alkaline Phosphatase 74  39 - 117 U/L   Total Bilirubin 0.7  0.3 - 1.2 mg/dL   GFR calc non Af Amer 68 (*) >90 mL/min   GFR calc Af Amer 79 (*) >90 mL/min  URINALYSIS, ROUTINE W REFLEX MICROSCOPIC      Result Value Ref Range   Color, Urine YELLOW  YELLOW   APPearance CLEAR  CLEAR   Specific Gravity, Urine 1.017  1.005 - 1.030   pH 7.5  5.0 - 8.0   Glucose, UA NEGATIVE  NEGATIVE mg/dL   Hgb urine dipstick NEGATIVE  NEGATIVE   Bilirubin Urine NEGATIVE  NEGATIVE   Ketones, ur NEGATIVE  NEGATIVE mg/dL   Protein, ur NEGATIVE  NEGATIVE mg/dL   Urobilinogen, UA 0.2  0.0 - 1.0 mg/dL   Nitrite NEGATIVE  NEGATIVE   Leukocytes, UA NEGATIVE  NEGATIVE  URINE RAPID DRUG  SCREEN (HOSP PERFORMED)      Result Value Ref Range   Opiates NONE DETECTED  NONE DETECTED   Cocaine NONE DETECTED  NONE DETECTED   Benzodiazepines NONE DETECTED  NONE DETECTED   Amphetamines NONE DETECTED  NONE DETECTED   Tetrahydrocannabinol NONE DETECTED  NONE DETECTED   Barbiturates NONE DETECTED  NONE DETECTED  ETHANOL      Result Value Ref Range   Alcohol, Ethyl (B) <11  0 - 11 mg/dL   Ct Abdomen Pelvis Wo Contrast  08/26/2013   CLINICAL DATA:  Right upper quadrant pain .  EXAM: CT ABDOMEN AND PELVIS WITHOUT CONTRAST  TECHNIQUE: Multidetector CT imaging of the abdomen and pelvis was performed following the standard protocol without intravenous contrast.  COMPARISON:  DG ABD PORTABLE 1V dated 08/22/2013; CT ABD/PELV WO CM dated 03/20/2013; CT ANGIO CHEST W/CM &/OR WO/CM dated 01/02/2011  FINDINGS: Liver normal. Stable splenic lesion with calcification, most likely benign vascular lesion. Pancreas is normal. Cholecystectomy. No biliary distention.  Adrenals normal. Bilateral prominent parapelvic cysts are noted. Nonobstructing right nephrolithiasis. Simple cysts both kidneys. No hydronephrosis. Multiple calcifications in the pelvis consistent phleboliths. Nonobstructing distal ureteral stones cannot be excluded. Bladder is nondistended. Hysterectomy. Adnexal regions are stable. No free pelvic fluid.  No adenopathy.  Aorta normal caliber.  Appendix normal. No bowel distention. Extensive diverticulosis, no evidence of diverticulitis. No free air. No mesenteric masses. Umbilical hernia with herniation of fat only.  Borderline cardiomegaly. Mild pericardial thickening versus pericardial effusion. Mild atelectasis lung bases. Severe degenerative changes lumbar spine. Severe scoliosis lumbar spine.  IMPRESSION: 1. No acute abnormality. 2. Bilateral prominent renal parapelvic cysts. Nonobstructing right nephrolithiasis. Multiple pinpoint calcifications are noted in the pelvis. These are most likely  phleboliths. Nonobstructing distal ureteral stones can not be excluded. No hydroureter. 3. Extensive diverticulosis.  No evidence of diverticulitis.   Electronically Signed   By: Marcello Moores  Register   On: 08/26/2013 12:33   Dg Chest 2 View  08/21/2013   CLINICAL DATA:  Cough and dyspnea  EXAM: CHEST  2 VIEW  COMPARISON:  DG CHEST 2 VIEW dated 02/28/2013  FINDINGS: The lungs are adequately inflated. There is no focal infiltrate. There is no pleural effusion. The cardiac silhouette is mildly enlarged but stable. The pulmonary vascularity is not engorged. There is no pleural effusion or pneumothorax. There are surgical clips in the right axillary region reportedly from previous lymph node dissection for breast malignancy. There is chronic cortical thickening of the posterior aspects of the right fifth and sixth ribs. The thoracic vertebral bodies are preserved in height.  IMPRESSION: 1. There is no evidence of pneumonia nor CHF. Chronically increased interstitial markings are present bilaterally. One cannot exclude acute bronchitis in the appropriate clinical setting. 2. There are no findings suspicious for pulmonary metastatic disease. Stable chronic deformity of the posterior aspects of the right fifth and sixth ribs is present.   Electronically Signed   By: David  Martinique   On: 08/21/2013 09:23   Ct Angio Chest Pe W/cm &/or Wo Cm  08/21/2013   CLINICAL DATA:  Shortness of breath.  History breast cancer.  EXAM: CT ANGIOGRAPHY CHEST WITH CONTRAST  TECHNIQUE: Multidetector CT imaging of the chest was performed using the standard protocol during bolus administration of intravenous contrast. Multiplanar CT image reconstructions and MIPs were obtained to evaluate the vascular anatomy.  CONTRAST:  187mL OMNIPAQUE IOHEXOL 350 MG/ML SOLN  COMPARISON:  Two-view chest 08/21/2013.  CTA chest 03/13/2013.  FINDINGS: Pulmonary arterial opacification is satisfactory. There is no significant filling defect within the central  pulmonary arteries. Patient breathing motion degrades evaluation of the distal vessel is. Atherosclerotic calcifications are again noted within the thoracic aorta. The heart is enlarged. Coronary artery calcifications are present.  A low-density lesion within the anterior left spleen is stable. The heart size is normal. Coronary artery calcifications are evident.  The lungs are clear. The bone windows demonstrate extensive fusion of anterior osteophytes and rightward curvature of the lower thoracic spine. No focal lytic or blastic lesions are present.  Review of the MIP images confirms the above findings.  IMPRESSION: 1. No evidence for pulmonary embolus. 2. Stable cardiomegaly and coronary artery calcifications. 3. Atherosclerotic calcifications within the thoracic aorta. 4. Stable hypodense lesion within the anterior spleen, a chronic finding. 5. No acute or focal lesion to explain the patient's symptoms.   Electronically Signed  By: Lawrence Santiago M.D.   On: 08/21/2013 21:25   Dg Abd Portable 1v  08/22/2013   CLINICAL DATA:  Abdominal pain, distention, nausea, vomiting.  EXAM: PORTABLE ABDOMEN - 1 VIEW  COMPARISON:  CT ABD/PELV WO CM dated 03/20/2013  FINDINGS: Prior cholecystectomy. Nonobstructive bowel gas pattern. No free air, organomegaly or suspicious calcification. Degenerative changes in the thoracic spinal scoliosis. Degenerative changes in the hips. No acute bony abnormality.  IMPRESSION: No acute findings.   Electronically Signed   By: Rolm Baptise M.D.   On: 08/22/2013 19:31       MDM  Labs.  Reviewed nursing notes and prior charts for additional history.   Xanax po.  Recheck feels improved.  Psych team has assessed, and indicates psych providers will see in morning, reassess, and if stable they may d/c then w medical adjustment.   Pt agreeable w plan.  Requests an ambien for sleep, which she takes at home.   Recheck pt calm, comfortable.      Mirna Mires, MD 09/06/13  [phone]

## 2013-09-05 NOTE — Telephone Encounter (Signed)
Attempted to notify patient x 2 tries line busy both times.

## 2013-09-05 NOTE — BH Assessment (Signed)
Assessment Note  Patient is an 80 year old white female that reports SI after she was placed on prednisone for few days.  Since the patient was taken off the prednisone at the ER the patient now denies any SI.  Patient denies SI/HI and psychosis.  Patient denies a prior history of psychiatric hospitalizations.  Patient denies physical, sexual or emotional abuse.   Patient now denies feelings of anxiety and restless.  Patient denies prior history of anxiety or depression, but it is noted that she was prescribed celexa, and previously on klonopin. Patient  reports that she took celexa on prn basis for years, if she felt overwhelmed or had many stressors.   Axis I: Adjustment Disorder with Anxiety Axis II: Deferred Axis III:  Past Medical History  Diagnosis Date  . Morbid obesity   . MIGRAINE HEADACHE   . ARTHRITIS, KNEES, BILATERAL   . DEPRESSION   . GERD   . HYPERLIPIDEMIA   . HYPERTENSION   . Breast CA 2000    s/p R lumpectomy and XRT  . DJD (degenerative joint disease) of knee   . Urge incontinence   . HYPOTHYROIDISM     postsurgical  . OSA (obstructive sleep apnea) 01/17/2011 dx   Axis IV: other psychosocial or environmental problems and problems with access to health care services Axis V: 51-60 moderate symptoms  Past Medical History:  Past Medical History  Diagnosis Date  . Morbid obesity   . MIGRAINE HEADACHE   . ARTHRITIS, KNEES, BILATERAL   . DEPRESSION   . GERD   . HYPERLIPIDEMIA   . HYPERTENSION   . Breast CA 2000    s/p R lumpectomy and XRT  . DJD (degenerative joint disease) of knee   . Urge incontinence   . HYPOTHYROIDISM     postsurgical  . OSA (obstructive sleep apnea) 01/17/2011 dx    Past Surgical History  Procedure Laterality Date  . Cholecystectomy  1995  . Breast lumpectomy  2000  . Thyroidectomy    . Breast biopsy  2000  . Replacement total knee bilateral  1999, 2005  . Tubal ligation    . Parathyroidectomy      2-3 removed  .  Tonsillectomy    . Adenoidectomy    . Right leg femur repaired      Family History:  Family History  Problem Relation Age of Onset  . Emphysema Sister   . Coronary artery disease Neg Hx     Social History:  reports that she quit smoking about 39 years ago. Her smoking use included Cigarettes. She has a .5 pack-year smoking history. She has never used smokeless tobacco. She reports that she does not drink alcohol or use illicit drugs.  Additional Social History:     CIWA: CIWA-Ar BP: 157/82 mmHg Pulse Rate: 81 COWS:    Allergies:  Allergies  Allergen Reactions  . Azithromycin Itching  . Ciprofloxacin Other (See Comments)    REACTION: edgy and very jumpy   . Codeine Nausea And Vomiting  . Epinephrine Other (See Comments)    REACTION: rapid pulse, sweats  . Klonopin [Clonazepam] Other (See Comments)    Makes her feel very suicidal   . Oxycodone Other (See Comments)    Patient felt like it altered her mental status "Crazy" . Hallucinations later as well.  [name] Paxil [Paroxetine Hydrochloride] Other (See Comments)    Sever depression   . Propoxyphene Hcl Nausea And Vomiting  . Meloxicam Diarrhea    Home Medications:  (  Not in a hospital admission)  OB/GYN Status:  No LMP recorded. Patient is postmenopausal.  General Assessment Data Location of Assessment: [facility] Is this a Tele or Face-to-Face Assessment?: Face-to-Face Is this an Initial Assessment or a Re-assessment for this encounter?: Initial Assessment Living Arrangements: Spouse/significant other (Adult [place]) Can pt return to current living arrangement?: Yes Admission Status: Voluntary Is patient capable of signing voluntary admission?: Yes Transfer from: [facility] Referral Source: Self/Family/Friend  Medical Screening Exam ([place]) Medical Exam completed: Yes  [place] Living Arrangements: Spouse/significant other (Adult [place]) Name of  Psychiatrist: NA Name of Therapist: NA  Education Status Is patient currently in school?: No Current Grade: NA Highest grade of school patient has completed: NA Name of school: NA Contact person: NA  Risk to self Suicidal Ideation: No Suicidal Intent: No Is patient at risk for suicide?: No Suicidal Plan?: No Access to Means: No What has been your use of drugs/alcohol within the last 12 months?: NA Previous Attempts/Gestures: No How many times?: 0 Other Self Harm Risks: NA Triggers for Past Attempts: Other (Comment) (NA) Intentional Self Injurious Behavior: None Family Suicide History: No Recent stressful life event(s): Other (Comment) (None Reported) Persecutory voices/beliefs?: No Depression: Yes Depression Symptoms: Fatigue;Feeling worthless/self pity Substance abuse history and/or treatment for substance abuse?: No Suicide prevention information given to non-admitted patients: Not applicable  Risk to Others Homicidal Ideation: No Thoughts of Harm to Others: No Current Homicidal Intent: No Current Homicidal Plan: No Access to Homicidal Means: No Identified Victim: NA History of harm to others?: No Assessment of Violence: None Noted Violent Behavior Description: NA Does patient have access to weapons?: No Criminal Charges Pending?: No Does patient have a court date: No  Psychosis Hallucinations: None noted Delusions: None noted  Mental Status Report Appear/Hygiene: Disheveled Eye Contact: Fair Motor Activity: Freedom of movement Speech: Logical/coherent Level of Consciousness: Alert;Restless Mood: Depressed;Anxious Affect: Appropriate to circumstance Anxiety Level: Minimal Thought Processes: Coherent;Relevant Judgement: Unimpaired Orientation: Person;Place;Time;Situation Obsessive Compulsive Thoughts/Behaviors: Minimal  Cognitive Functioning Concentration: Decreased Memory: Recent Intact;Remote Intact IQ: Average Insight: Fair Impulse Control:  Fair Appetite: Fair Weight Loss: 0 Weight Gain: 0 Sleep: No Change Total Hours of Sleep: 8 Vegetative Symptoms: None  ADLScreening [facility] Assessment Services) Patient's cognitive ability adequate to safely complete daily activities?: Yes Patient able to express need for assistance with ADLs?: Yes Independently performs ADLs?: No  Prior Inpatient Therapy Prior Therapy Dates: a Prior Therapy Facilty/Provider(s): na Reason for Treatment: na  Prior Outpatient Therapy Prior Outpatient Therapy: No Prior Therapy Dates: A Prior Therapy Facilty/Provider(s): NA Reason for Treatment: NA  ADL Screening (condition at time of admission) Patient's cognitive ability adequate to safely complete daily activities?: Yes Patient able to express need for assistance with ADLs?: Yes Independently performs ADLs?: No         Values / Beliefs Cultural Requests During Hospitalization: None Spiritual Requests During Hospitalization: None     Nutrition Screen- [facility] Patient's home diet:  (pt. given [place] sandwich.)  Additional Information 1:1 In Past 12 Months?: No CIRT Risk: No Elopement Risk: No Does patient have medical clearance?: Yes     Disposition: Pending psych disposition in the morning for discharge and medication evaluation.  Disposition Initial Assessment Completed for this Encounter: Yes Disposition of Patient: Other dispositions Other disposition(s): Other (Comment) (Pending psych disposition for discharge with medication reco)  On Site Evaluation by:   Reviewed with Physician:    Graciella Freer [place] 09/05/2013  11:00 PM

## 2013-09-05 NOTE — Telephone Encounter (Signed)
Patient of Dr Asa Lente. She calls stating she feels she having withdrawls from the Prednisone taper she didn't complete. She feels jumpy, is not sleeping, feeling anxious and these symptoms have been most of this week. Please advise.

## 2013-09-05 NOTE — Progress Notes (Signed)
Writer assessed patient and she will be pending a discharge by the Psychiatrist in the morning with medication recommendations.    Writer informed the ERMD Dr. Coralyn Pear of the patients disposition.

## 2013-09-06 DIAGNOSIS — F329 Major depressive disorder, single episode, unspecified: Secondary | ICD-10-CM | POA: Diagnosis not present

## 2013-09-06 DIAGNOSIS — F3289 Other specified depressive episodes: Secondary | ICD-10-CM | POA: Diagnosis not present

## 2013-09-06 LAB — T4, FREE: Free T4: 1.66 ng/dL (ref 0.80–1.80)

## 2013-09-06 LAB — TSH: TSH: 6.893 u[IU]/mL — AB (ref 0.350–4.500)

## 2013-09-06 MED ORDER — IPRATROPIUM-ALBUTEROL 0.5-2.5 (3) MG/3ML IN SOLN: 3.0000 mL | Freq: Four times a day (QID) | RESPIRATORY_TRACT | Status: DC | PRN

## 2013-09-06 MED ORDER — LOPERAMIDE HCL 2 MG PO CAPS
2.0000 mg | ORAL_CAPSULE | Freq: Once | ORAL | Status: AC
  Administered 2013-09-06: 2 mg via ORAL
  Filled 2013-09-06: qty 1

## 2013-09-06 MED ORDER — AMLODIPINE BESYLATE 5 MG PO TABS
5.0000 mg | ORAL_TABLET | Freq: Every day | ORAL | Status: DC
  Administered 2013-09-06: 5 mg via ORAL
  Filled 2013-09-06: qty 1

## 2013-09-06 MED ORDER — HYDROCHLOROTHIAZIDE 12.5 MG PO CAPS
12.5000 mg | ORAL_CAPSULE | Freq: Every day | ORAL | Status: DC
  Administered 2013-09-06: 12.5 mg via ORAL
  Filled 2013-09-06: qty 1

## 2013-09-06 MED ORDER — IRBESARTAN 150 MG PO TABS
150.0000 mg | ORAL_TABLET | Freq: Every day | ORAL | Status: DC
Start: 2013-09-06 — End: 2013-09-06
  Administered 2013-09-06: 150 mg via ORAL
  Filled 2013-09-06: qty 1

## 2013-09-06 MED ORDER — ALPRAZOLAM 0.25 MG PO TABS: ORAL_TABLET | ORAL | Status: DC

## 2013-09-06 NOTE — ED Notes (Signed)
Pt alert and oriented x4. Respirations even and unlabored, bilateral symmetrical rise and fall of chest. Skin warm and dry. In no acute distress. Denies needs.   

## 2013-09-06 NOTE — ED Provider Notes (Signed)
Please see psych consultation note  Hoy Morn, MD 09/06/13 1124

## 2013-09-06 NOTE — Progress Notes (Signed)
[facility] MD Progress Note  09/06/2013 10:47 AM Tamara Morales  MRN:  [id number] Subjective:  Pt is an 80 year old MWF who lives in an assisted living  Facility in [place] with her husband. Pt states that she developed bronchitis 3 weeks ago and was seen in the [facility]. She was given predisone 80 mg, then reduced to 40 mg. She was sent home with 35 mg prednisone and took one dose 2 weeks ago. Since then she has been agitated and anxious. She hasn't slept well and feels more depressed. Yesterday she felt suicidal. Clonazepam was tried but made her more depressed. She takes Celexa but only intermittantlyShe was given Xanax last night and she slept well and is no longer suicidal. She does not have therapist or psychiatrist but would benefit as she is under stress. 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   HENT: Negative.   Eyes: Negative.   Respiratory: Negative.   Cardiovascular: Negative.   Gastrointestinal: Negative.   Genitourinary: Negative.   Musculoskeletal: Negative.   Skin: Negative.   Endo/Heme/Allergies: Negative.   Psychiatric/Behavioral: Positive for depression. The patient is nervous/anxious and has insomnia.     Blood pressure 145/78, pulse 72, temperature 98.3 F (36.8 C), temperature source Oral, resp. rate 20, SpO2 95.00%.There is no weight on file to calculate BMI.  General Appearance: Casual and Fairly Groomed  Engineer, water::  Good  Speech:  Clear and Coherent   Volume:  Normal  Mood:  Anxious  Affect:  Congruent  Thought Process:  Goal Directed  Orientation:  Full (Time, Place, and Person)  Thought Content:  Rumination  Suicidal Thoughts:  No  Homicidal Thoughts:  No  Memory:  Negative  Judgement:  Good  Insight:  Good  Psychomotor Activity:  Normal  Concentration:  Good  Recall:  Good  Fund of Knowledge:Good  Language: Good  Akathisia:  No  Handed:  Right  AIMS (if indicated):     Assets:  Communication Skills Desire for Improvement Social Support  Sleep:      Musculoskeletal: Strength & Muscle Tone: within normal limits Gait & Station: normal Patient leans: N/A  Current Medications: Current Facility-Administered Medications  Medication Dose Route Frequency Provider Last Rate Last Dose  . amLODipine (NORVASC) tablet 5 mg  5 mg Oral Daily Julian Crowford Tyler Deis., RPH   5 mg at 09/06/13 1028  . aspirin EC tablet 81 mg  81 mg Oral q morning - 10a Mirna Mires, MD   81 mg at 09/06/13 1023  . hydrochlorothiazide (MICROZIDE) capsule 12.5 mg  12.5 mg Oral Daily [address] Tyler Deis., RPH   12.5 mg at 09/06/13 1029  . ipratropium-albuterol (DUONEB) 0.5-2.5 (3) MG/3ML nebulizer solution 3 mL  3 mL Nebulization Q6H PRN Shea Stakes Crowford Tyler Deis., [facility]      . irbesartan (AVAPRO) tablet 150 mg  150 mg Oral Daily [address] Tyler Deis., RPH   150 mg at 09/06/13 1023  . levothyroxine (SYNTHROID, LEVOTHROID) tablet 137 mcg  137  mcg Oral QAC breakfast Mirna Mires, MD   137 mcg at 09/06/13 [phone]  . pantoprazole (PROTONIX) EC tablet 40 mg  40 mg Oral Daily Mirna Mires, MD   40 mg at 09/06/13 1026  . potassium chloride SA (K-DUR,KLOR-CON) CR tablet 20 mEq  20 mEq Oral Daily Mirna Mires, MD   20 mEq at 09/06/13 1026  . zolpidem (AMBIEN) tablet 5 mg  5 mg Oral QHS PRN Mirna Mires, MD   5 mg at 09/06/13 0022   Current Outpatient Prescriptions  Medication Sig Dispense Refill  . albuterol-ipratropium (COMBIVENT) 18-103  MCG/ACT inhaler Inhale 2 puffs into the lungs every 6 (six) hours as needed for wheezing or shortness of breath.       [name] aspirin 81 MG tablet Take 81 mg by mouth every morning.       . citalopram (CELEXA) 10 MG tablet Take 10 mg by mouth as needed (anxiety).      [name] diphenhydrAMINE (BENADRYL) 25 mg capsule Take 25 mg by mouth every 6 (six) hours as needed for sleep.      [name] esomeprazole (NEXIUM) 40 MG capsule Take 1 capsule (40 mg total) by mouth daily.  90 capsule  3  . FIBER PO Take 2 tablets by mouth every morning.       [name] levothyroxine (SYNTHROID, LEVOTHROID) 137 MCG tablet Take 137 mcg by mouth daily before breakfast.       . loratadine (CLARITIN) 10 MG tablet Take 10 mg by mouth daily as needed for allergies.       . Olmesartan-Amlodipine-HCTZ (TRIBENZOR) 20-5-12.5 MG TABS Take 1 tablet by mouth every morning.       . Omega-3 Fatty Acids (FISH OIL) 1200 MG CAPS Take 2 capsules by mouth every morning.       . potassium chloride (K-DUR,KLOR-CON) 10 MEQ tablet Take 20 mEq by mouth daily.      [name] senna-docusate (SENOKOT-S) 8.6-50 MG per tablet Take 2 tablets by mouth at bedtime as needed for mild constipation.  30 tablet  1  . zolpidem (AMBIEN) 5 MG tablet Take 5 mg by mouth at bedtime as needed for sleep.      [name] dicyclomine (BENTYL) 20 MG tablet Take 1 tablet (20 mg total) by mouth 2 (two) times daily as needed for spasms.  40 tablet  0  . phenazopyridine (AZO-TABS) 95 MG tablet Take 95 mg by mouth daily as needed (for urinary pain).         Lab Results:  Results for orders placed during the hospital encounter of 09/05/13 (from the past 48 hour(s))  CBC     Status: None   Collection Time    09/05/13  7:55 PM      Result Value Ref Range   WBC 7.2  4.0 - 10.5 K/uL   RBC 4.61  3.87 - 5.11 MIL/uL   Hemoglobin 14.3  12.0 - 15.0 g/dL   HCT 42.1  36.0 - 46.0 %   MCV 91.3  78.0 - 100.0 fL   MCH 31.0  26.0 - 34.0 pg   MCHC 34.0  30.0 - 36.0 g/dL   RDW 13.5  11.5 - 15.5 %   Platelets 162  150 -  400 K/uL  COMPREHENSIVE METABOLIC PANEL     Status: Abnormal   Collection Time    09/05/13  7:55 PM      Result Value Ref Range   Sodium 141  137 - 147 mEq/L  Potassium 3.2 (*) 3.7 - 5.3 mEq/L   Chloride 99  96 - 112 mEq/L   CO2 25  19 - 32 mEq/L   Glucose, Bld 98  70 - 99 mg/dL   BUN 9  6 - 23 mg/dL   Creatinine, Ser 0.80  0.50 - 1.10 mg/dL   Calcium 8.8  8.4 - 10.5 mg/dL   Total Protein 6.8  6.0 - 8.3 g/dL   Albumin 3.6  3.5 - 5.2 g/dL   AST 13  0 - 37 U/L   ALT 12  0 - 35 U/L   Alkaline Phosphatase 74  39 - 117 U/L   Total Bilirubin 0.7  0.3 - 1.2 mg/dL   GFR calc non Af Amer 68 (*) >90 mL/min   GFR calc Af Amer 79 (*) >90 mL/min   Comment: (NOTE)     The eGFR has been calculated using the CKD EPI equation.     This calculation has not been validated in all clinical situations.     eGFR's persistently <90 mL/min signify possible Chronic Kidney     Disease.  ETHANOL     Status: None   Collection Time    09/05/13  7:55 PM      Result Value Ref Range   Alcohol, Ethyl (B) <11  0 - 11 mg/dL   Comment:            LOWEST DETECTABLE LIMIT FOR     SERUM ALCOHOL IS 11 mg/dL     FOR MEDICAL PURPOSES ONLY  TSH     Status: Abnormal   Collection Time    09/05/13  7:55 PM      Result Value Ref Range   TSH 6.893 (*) 0.350 - 4.500 uIU/mL   Comment: Performed at [facility]  T4, FREE     Status: None   Collection Time    09/05/13  7:55 PM      Result Value Ref Range   Free T4 1.66  0.80 - 1.80 ng/dL   Comment: Performed at [employer] [facility]     Status: None   Collection Time    09/05/13  8:16 PM      Result Value Ref Range   Color, Urine YELLOW  YELLOW   APPearance CLEAR  CLEAR   Specific Gravity, Urine 1.017  1.005 - 1.030   pH 7.5  5.0 - 8.0   Glucose, UA NEGATIVE  NEGATIVE mg/dL   Hgb urine dipstick NEGATIVE  NEGATIVE   Bilirubin Urine NEGATIVE  NEGATIVE   Ketones, ur NEGATIVE  NEGATIVE mg/dL   Protein, ur NEGATIVE   NEGATIVE mg/dL   Urobilinogen, UA 0.2  0.0 - 1.0 mg/dL   Nitrite NEGATIVE  NEGATIVE   Leukocytes, UA NEGATIVE  NEGATIVE   Comment: MICROSCOPIC NOT DONE ON URINES WITH NEGATIVE PROTEIN, BLOOD, LEUKOCYTES, NITRITE, OR GLUCOSE <1000 mg/dL.  URINE RAPID DRUG SCREEN (HOSP PERFORMED)     Status: None   Collection Time    09/05/13  8:16 PM      Result Value Ref Range   Opiates NONE DETECTED  NONE DETECTED   Cocaine NONE DETECTED  NONE DETECTED   Benzodiazepines NONE DETECTED  NONE DETECTED   Amphetamines NONE DETECTED  NONE DETECTED   Tetrahydrocannabinol NONE DETECTED  NONE DETECTED   Barbiturates NONE DETECTED  NONE DETECTED   Comment:            DRUG SCREEN FOR MEDICAL PURPOSES  ONLY.  IF CONFIRMATION IS NEEDED     FOR ANY PURPOSE, NOTIFY LAB     WITHIN 5 DAYS.                LOWEST DETECTABLE LIMITS     FOR URINE DRUG SCREEN     Drug Class       Cutoff (ng/mL)     Amphetamine      1000     Barbiturate      200     Benzodiazepine   403     Tricyclics       709     Opiates          300     Cocaine          300     THC              50    Physical Findings: AIMS:  , ,  ,  ,    CIWA:    COWS:     Treatment Plan Summary: Pt had a good response to xanax. She will be given a prescription with referral to [facility] out patient clinic    Medical Decision Making Problem Points:  Established problem, stable/improving (1) Data Points:  Review of medication regiment & side effects (2) Review of new medications or change in dosage (2)  I certify that inpatient services furnished can reasonably be expected to improve the patient's condition.   Levonne Spiller MD 09/06/2013, 10:47 AM

## 2013-09-06 NOTE — Discharge Instructions (Signed)
[facility] for [facility]

## 2013-09-06 NOTE — ED Notes (Signed)
Pt complaining of diarrhea this morning. Psych PA ordering immodium.

## 2013-09-06 NOTE — [facility] Suicide Risk Assessment (Signed)
   Demographic Factors:  Age 80 or older  Total Time spent with patient: 15 minutes  Psychiatric Specialty Exam: Physical Examnot done  ROS  Blood pressure 145/78, pulse 84, temperature 98.3 F (36.8 C), temperature source Oral, resp. rate 16, SpO2 96.00%.There is no weight on file to calculate BMI.  General Appearance: Casual and Fairly Groomed  Engineer, water::  Good  Speech:  Clear and Coherent  Volume:  Normal  Mood:  Anxious  Affect:  Congruent  Thought Process:  Goal Directed  Orientation:  Full (Time, Place, and Person)  Thought Content:  Rumination  Suicidal Thoughts:  No  Homicidal Thoughts:  No  Memory:  Negative  Judgement:  Good  Insight:  Good  Psychomotor Activity:  Normal  Concentration:  Good  Recall:  Good  Fund of Knowledge:Good  Language: Good  Akathisia:  No  Handed:  Right  AIMS (if indicated):     Assets:  Communication Skills Desire for Improvement Social Support  Sleep:       Musculoskeletal: Strength & Muscle Tone: within normal limits Gait & Station: normal Patient leans: N/A    Current Mental Status by Physician: Pt is not suicidal  Loss Factors: NA  Historical Factors: NA  Risk Reduction Factors:   Sense of responsibility to family, Living with another person, especially a relative, Positive social support and Positive coping skills or problem solving skills  Continued Clinical Symptoms:  Severe Anxiety and/or Agitation  Cognitive Features That Contribute To Risk:  n/a   Suicide Risk:  Minimal: No identifiable suicidal ideation.  Patients presenting with no risk factors but with morbid ruminations; may be classified as minimal risk based on the severity of the depressive symptoms  Discharge Diagnoses:   AXIS I:  Depressive Disorder secondary to general medical condition AXIS II:  Deferred AXIS III:   Past Medical History  Diagnosis Date  . Morbid obesity   . MIGRAINE HEADACHE   . ARTHRITIS, KNEES, BILATERAL   .  DEPRESSION   . GERD   . HYPERLIPIDEMIA   . HYPERTENSION   . Breast CA 2000    s/p R lumpectomy and XRT  . DJD (degenerative joint disease) of knee   . Urge incontinence   . HYPOTHYROIDISM     postsurgical  . OSA (obstructive sleep apnea) 01/17/2011 dx   AXIS IV:  other psychosocial or environmental problems AXIS V:  61-70 mild symptoms  Plan Of Care/Follow-up recommendations:  Activity:  as tolerated  Is patient on multiple antipsychotic therapies at discharge:  No     Recommended Plan for Multiple Antipsychotic Therapies: NA     ROSS, Fabens 09/06/2013, 11:22 AM

## 2013-09-08 ENCOUNTER — Other Ambulatory Visit: Payer: Self-pay | Admitting: Internal Medicine

## 2013-09-11 ENCOUNTER — Encounter: Payer: Self-pay | Admitting: Internal Medicine

## 2013-09-11 MED ORDER — FLUCONAZOLE 150 MG PO TABS: 150.0000 mg | ORAL_TABLET | Freq: Once | ORAL | Status: DC

## 2013-10-02 ENCOUNTER — Encounter: Payer: Self-pay | Admitting: Internal Medicine

## 2013-10-02 MED ORDER — LEVOTHYROXINE SODIUM 150 MCG PO TABS: 150.0000 ug | ORAL_TABLET | Freq: Every day | ORAL | Status: DC

## 2013-10-02 MED ORDER — ALPRAZOLAM 0.25 MG PO TABS: 0.2500 mg | ORAL_TABLET | Freq: Two times a day (BID) | ORAL | Status: DC | PRN

## 2013-10-02 NOTE — Telephone Encounter (Signed)
Faxed xanax to [facility]...Johny Chess

## 2013-10-05 ENCOUNTER — Encounter: Payer: Self-pay | Admitting: Internal Medicine

## 2013-10-05 ENCOUNTER — Emergency Department (HOSPITAL_BASED_OUTPATIENT_CLINIC_OR_DEPARTMENT_OTHER)
Admission: EM | Admit: 2013-10-05 | Discharge: 2013-10-05 | Discharge status: Against Medical Advice | Payer: Medicare Other | Attending: Emergency Medicine | Admitting: Emergency Medicine

## 2013-10-05 ENCOUNTER — Emergency Department (HOSPITAL_BASED_OUTPATIENT_CLINIC_OR_DEPARTMENT_OTHER): Payer: Medicare Other

## 2013-10-05 ENCOUNTER — Encounter (HOSPITAL_BASED_OUTPATIENT_CLINIC_OR_DEPARTMENT_OTHER): Payer: Self-pay | Admitting: Emergency Medicine

## 2013-10-05 DIAGNOSIS — R0602 Shortness of breath: Secondary | ICD-10-CM | POA: Insufficient documentation

## 2013-10-05 DIAGNOSIS — Z853 Personal history of malignant neoplasm of breast: Secondary | ICD-10-CM | POA: Diagnosis not present

## 2013-10-05 DIAGNOSIS — Z87891 Personal history of nicotine dependence: Secondary | ICD-10-CM | POA: Diagnosis not present

## 2013-10-05 DIAGNOSIS — I10 Essential (primary) hypertension: Secondary | ICD-10-CM | POA: Diagnosis not present

## 2013-10-05 DIAGNOSIS — Z5320 Procedure and treatment not carried out because of patient's decision for unspecified reasons: Secondary | ICD-10-CM

## 2013-10-05 DIAGNOSIS — Z9119 Patient's noncompliance with other medical treatment and regimen: Secondary | ICD-10-CM

## 2013-10-05 NOTE — ED Notes (Signed)
Patient did not want to wait to see MD. Xray done under protocol earlier, comfort measures provided earlier and patient continued to state she just didn't have time to wait

## 2013-10-05 NOTE — ED Notes (Signed)
Last night began with shortness of breath, wears cpap at night. States it felt similar to previous case of bronchitis. Able to breath easier this morning, mild non-productive cough.

## 2013-10-06 NOTE — ED Provider Notes (Signed)
CSN: [id number]     Arrival date & time 10/05/13  1150 History   First MD Initiated Contact with Patient 10/05/13 1406     Chief Complaint  Patient presents with  . Shortness of Breath     (Consider location/radiation/quality/duration/timing/severity/associated sxs/prior Treatment) HPI  Past Medical History  Diagnosis Date  . Morbid obesity   . MIGRAINE HEADACHE   . ARTHRITIS, KNEES, BILATERAL   . DEPRESSION   . GERD   . HYPERLIPIDEMIA   . HYPERTENSION   . Breast CA 2000    s/p R lumpectomy and XRT  . DJD (degenerative joint disease) of knee   . Urge incontinence   . HYPOTHYROIDISM     postsurgical  . OSA (obstructive sleep apnea) 01/17/2011 dx   Past Surgical History  Procedure Laterality Date  . Cholecystectomy  1995  . Breast lumpectomy  2000  . Thyroidectomy    . Breast biopsy  2000  . Replacement total knee bilateral  1999, 2005  . Tubal ligation    . Parathyroidectomy      2-3 removed  . Tonsillectomy    . Adenoidectomy    . Right leg femur repaired     Family History  Problem Relation Age of Onset  . Emphysema Sister   . Coronary artery disease Neg Hx    History  Substance Use Topics  . Smoking status: Former Smoker -- 0.25 packs/day for 2 years    Types: Cigarettes    Quit date: 06/19/1974  . Smokeless tobacco: Never Used     Comment: Quit in late 20's  . Alcohol Use: No     Comment: rare   OB History   Grav Para Term Preterm Abortions TAB SAB Ect Mult Living                 Review of Systems    Allergies  Azithromycin; Ciprofloxacin; Codeine; Epinephrine; Klonopin; Oxycodone; Paxil; Propoxyphene hcl; and Meloxicam  Home Medications   Prior to Admission medications   Medication Sig Start Date End Date Taking? Authorizing Provider  albuterol-ipratropium (COMBIVENT) 18-103 MCG/ACT inhaler Inhale 2 puffs into the lungs every 6 (six) hours as needed for wheezing or shortness of breath.     Historical Provider, MD  ALPRAZolam Duanne Moron) 0.25  MG tablet Take 1 tablet (0.25 mg total) by mouth 2 (two) times daily as needed for anxiety or sleep. 10/02/13   Rowe Clack, MD  aspirin 81 MG tablet Take 81 mg by mouth every morning.     Historical Provider, MD  citalopram (CELEXA) 10 MG tablet Take 10 mg by mouth as needed (anxiety).    Historical Provider, MD  dicyclomine (BENTYL) 20 MG tablet Take 1 tablet (20 mg total) by mouth 2 (two) times daily as needed for spasms. 08/29/13 09/02/13  Rowe Clack, MD  esomeprazole (NEXIUM) 40 MG capsule Take 1 capsule (40 mg total) by mouth daily. 08/04/13   Rowe Clack, MD  FIBER PO Take 2 tablets by mouth every morning.     Historical Provider, MD  fluconazole (DIFLUCAN) 150 MG tablet Take 1 tablet (150 mg total) by mouth once. May repeat dose next day if needed 09/11/13   Rowe Clack, MD  levothyroxine (SYNTHROID, LEVOTHROID) 150 MCG tablet Take 1 tablet (150 mcg total) by mouth daily before breakfast. 10/02/13   Rowe Clack, MD  loratadine (CLARITIN) 10 MG tablet Take 10 mg by mouth daily as needed for allergies.     Historical Provider,  MD  nystatin (MYCOSTATIN/NYSTOP) 100000 UNIT/GM POWD APPLY TO AFFECTED AREA TWICE A DAY 09/08/13   Rowe Clack, MD  Olmesartan-Amlodipine-HCTZ [facility]) 20-5-12.5 MG TABS Take 1 tablet by mouth every morning.     Historical Provider, MD  Omega-3 Fatty Acids (FISH OIL) 1200 MG CAPS Take 2 capsules by mouth every morning.     Historical Provider, MD  phenazopyridine (AZO-TABS) 95 MG tablet Take 95 mg by mouth daily as needed (for urinary pain).     Historical Provider, MD  potassium chloride (K-DUR,KLOR-CON) 10 MEQ tablet Take 20 mEq by mouth daily.    Historical Provider, MD  senna-docusate (SENOKOT-S) 8.6-50 MG per tablet Take 2 tablets by mouth at bedtime as needed for mild constipation. 08/29/13   Rowe Clack, MD   BP 145/78  Pulse 82  Temp(Src) 97.6 F (36.4 C) (Oral)  Resp 20  Ht $R'5\' 5"'iC$  (1.651 m)  Wt 300 lb (136.079 kg)   BMI 49.92 kg/m2  SpO2 100% Physical Exam  ED Course  Procedures (including critical care time) Labs Review Labs Reviewed - No data to display  Imaging Review Dg Chest 2 View  10/05/2013   CLINICAL DATA:  Shortness of breath.  EXAM: CHEST  2 VIEW  COMPARISON:  08/21/2013  FINDINGS: Mild cardiomegaly and ectasia of the thoracic are stable. Both lungs remain clear. No evidence of pleural effusion. Multiple surgical clips again seen in the right chest wall and axilla.  IMPRESSION: Stable mild cardiomegaly.  No active lung disease.   Electronically Signed   By: Earle Gell M.D.   On: 10/05/2013 13:55     EKG Interpretation None      MDM   Final diagnoses:  Patient left before treatment completed    Pt left before being seen after triage.     Neta Ehlers, MD 10/06/13 1008

## 2013-10-24 DIAGNOSIS — D485 Neoplasm of uncertain behavior of skin: Secondary | ICD-10-CM | POA: Diagnosis not present

## 2013-10-31 ENCOUNTER — Encounter: Payer: Self-pay | Admitting: Internal Medicine

## 2013-11-16 ENCOUNTER — Encounter (HOSPITAL_COMMUNITY): Payer: Self-pay | Admitting: Emergency Medicine

## 2013-11-16 ENCOUNTER — Emergency Department (INDEPENDENT_AMBULATORY_CARE_PROVIDER_SITE_OTHER): Payer: Medicare Other

## 2013-11-16 ENCOUNTER — Emergency Department (INDEPENDENT_AMBULATORY_CARE_PROVIDER_SITE_OTHER)
Admission: EM | Admit: 2013-11-16 | Discharge: 2013-11-16 | Disposition: A | Discharge status: Home / Self Care | Payer: Medicare Other | Source: Home / Self Care | Attending: Emergency Medicine | Admitting: Emergency Medicine

## 2013-11-16 DIAGNOSIS — J209 Acute bronchitis, unspecified: Secondary | ICD-10-CM

## 2013-11-16 DIAGNOSIS — R0602 Shortness of breath: Secondary | ICD-10-CM | POA: Diagnosis not present

## 2013-11-16 LAB — POCT I-STAT, CHEM 8
BUN: 10 mg/dL (ref 6–23)
CREATININE: 1 mg/dL (ref 0.50–1.10)
Calcium, Ion: 1.08 mmol/L — ABNORMAL LOW (ref 1.13–1.30)
Chloride: 99 mEq/L (ref 96–112)
Glucose, Bld: 110 mg/dL — ABNORMAL HIGH (ref 70–99)
HCT: 43 % (ref 36.0–46.0)
HEMOGLOBIN: 14.6 g/dL (ref 12.0–15.0)
POTASSIUM: 3.5 meq/L — AB (ref 3.7–5.3)
SODIUM: 142 meq/L (ref 137–147)
TCO2: 28 mmol/L (ref 0–100)

## 2013-11-16 MED ORDER — ALBUTEROL SULFATE (2.5 MG/3ML) 0.083% IN NEBU
INHALATION_SOLUTION | RESPIRATORY_TRACT | Status: AC
  Filled 2013-11-16: qty 3

## 2013-11-16 MED ORDER — BECLOMETHASONE DIPROPIONATE 80 MCG/ACT IN AERS: 2.0000 | INHALATION_SPRAY | Freq: Two times a day (BID) | RESPIRATORY_TRACT | Status: DC

## 2013-11-16 MED ORDER — ALBUTEROL SULFATE (2.5 MG/3ML) 0.083% IN NEBU: 2.5000 mg | INHALATION_SOLUTION | Freq: Four times a day (QID) | RESPIRATORY_TRACT | Status: DC | PRN

## 2013-11-16 MED ORDER — IPRATROPIUM BROMIDE 0.02 % IN SOLN
RESPIRATORY_TRACT | Status: AC
  Filled 2013-11-16: qty 2.5

## 2013-11-16 MED ORDER — IPRATROPIUM-ALBUTEROL 0.5-2.5 (3) MG/3ML IN SOLN
3.0000 mL | Freq: Once | RESPIRATORY_TRACT | Status: AC
  Administered 2013-11-16: 3 mL via RESPIRATORY_TRACT

## 2013-11-16 MED ORDER — ALBUTEROL SULFATE (2.5 MG/3ML) 0.083% IN NEBU
5.0000 mg | INHALATION_SOLUTION | Freq: Once | RESPIRATORY_TRACT | Status: AC
Start: 2013-11-16 — End: 2013-11-16
  Administered 2013-11-16: 5 mg via RESPIRATORY_TRACT

## 2013-11-16 MED ORDER — DOXYCYCLINE HYCLATE 100 MG PO TABS: 100.0000 mg | ORAL_TABLET | Freq: Two times a day (BID) | ORAL | Status: DC

## 2013-11-16 NOTE — Discharge Instructions (Signed)
Acute Bronchitis Bronchitis is inflammation of the airways that extend from the windpipe into the lungs (bronchi). The inflammation often causes mucus to develop. This leads to a cough, which is the most common symptom of bronchitis.  In acute bronchitis, the condition usually develops suddenly and goes away over time, usually in a couple weeks. Smoking, allergies, and asthma can make bronchitis worse. Repeated episodes of bronchitis may cause further lung problems.  CAUSES Acute bronchitis is most often caused by the same virus that causes a cold. The virus can spread from person to person (contagious).  SIGNS AND SYMPTOMS   Cough.   Fever.   Coughing up mucus.   Body aches.   Chest congestion.   Chills.   Shortness of breath.   Sore throat.  DIAGNOSIS  Acute bronchitis is usually diagnosed through a physical exam. Tests, such as chest X-rays, are sometimes done to rule out other conditions.  TREATMENT  Acute bronchitis usually goes away in a couple weeks. Often times, no medical treatment is necessary. Medicines are sometimes given for relief of fever or cough. Antibiotics are usually not needed but may be prescribed in certain situations. In some cases, an inhaler may be recommended to help reduce shortness of breath and control the cough. A cool mist vaporizer may also be used to help thin bronchial secretions and make it easier to clear the chest.  HOME CARE INSTRUCTIONS  Get plenty of rest.   Drink enough fluids to keep your urine clear or pale yellow (unless you have a medical condition that requires fluid restriction). Increasing fluids may help thin your secretions and will prevent dehydration.   Only take over-the-counter or prescription medicines as directed by your health care provider.   Avoid smoking and secondhand smoke. Exposure to cigarette smoke or irritating chemicals will make bronchitis worse. If you are a smoker, consider using nicotine gum or skin  patches to help control withdrawal symptoms. Quitting smoking will help your lungs heal faster.   Reduce the chances of another bout of acute bronchitis by washing your hands frequently, avoiding people with cold symptoms, and trying not to touch your hands to your mouth, nose, or eyes.   Follow up with your health care provider as directed.  SEEK MEDICAL CARE IF: Your symptoms do not improve after 1 week of treatment.  SEEK IMMEDIATE MEDICAL CARE IF:  You develop an increased fever or chills.   You have chest pain.   You have severe shortness of breath.  You have bloody sputum.   You develop dehydration.  You develop fainting.  You develop repeated vomiting.  You develop a severe headache. MAKE SURE YOU:   Understand these instructions.  Will watch your condition.  Will get help right away if you are not doing well or get worse. Document Released: 07/13/2004 Document Revised: 02/05/2013 Document Reviewed: 11/26/2012 [employer] Patient Information 2014 [employer].  

## 2013-11-16 NOTE — ED Provider Notes (Signed)
Chief Complaint   Chief Complaint  Patient presents with  . Shortness of Breath    History of Present Illness   Tamara Morales is an 80 year old female who has had a three-day history of dry cough, chest tightness, wheezing, shortness of breath, lack of appetite, malaise, fatigue, and weakness she also has some ear congestion and nasal congestion and rhinorrhea. She lives at friend's homes and notes that there have been some viruses going around there. She was hospitalized in March with the same symptoms with dyspnea and cough. Hospital workup revealed no evidence of cardiac or pulmonary disease. She was sent home with a diagnosis of bronchitis. She was discharged on prednisone but this caused agitation, depression, and ultimately suicidal ideation she had been taken off of it. She denies any fever, chills, headache, chest pain, dizziness, syncope, or GI symptoms. She has chronic ankle edema.  Review of Systems   Other than as noted above, the patient denies any of the following symptoms. Systemic:  No fever or chills. ENT:  No nasal congestion, rhinorrhea, or sore throat. Pulmonary:  No cough, wheezing, sputum production, hemoptysis. Cardiac:  No chest pain, palpitations, rapid heartbeat, dizziness, presyncope or syncope. Skin:  No rash or itching. Ext:  No leg pain or swelling. Psych:  No anxiety or depression.  [place]   Past medical history, family history, social history, meds, and allergies were reviewed. Current allergies include azithromycin, ciprofloxacin, codeine, epinephrine, Klonopin, oxycodone, Paxil, Darvocet, and meloxicam. Prednisone was not listed as allergy, but she developed severe depression and suicidal ideation while on it. Current meds include Combivent, Xanax, aspirin, Celexa, Lasix, Synthroid, Tribenzor, omega-3 fatty acids, potassium chloride, and Nexium. Medical history includes migraine headaches, osteoarthritis, depression, GERD, hyperlipidemia, hypertension,  breast cancer, and hypothyroidism. She is on CPAP for sleep apnea.  Physical Examination    Vital signs:  BP 125/63  Pulse 95  Temp(Src) 98.1 F (36.7 C) (Oral)  Resp 22  SpO2 97% Gen:  Alert, oriented, in no distress, skin warm and dry. Eye:  PERRL, lids and conjunctivas normal.  Sclera non-icteric. ENT:  Mucous membranes moist, pharynx clear. Neck:  Supple, no adenopathy or tenderness.  No JVD. Lungs:  Clear to auscultation and percussion, no wheezes, rales or rhonchi.  No respiratory distress. Heart:  Regular rhythm.  No gallops, murmers, clicks or rubs. Abdomen:  Soft, nontender, no organomegaly or mass.  Bowel sounds normal.  No pulsatile abdominal mass or bruit. Ext:  She has bilateral pitting ankle and pedal edema.  No calf tenderness and Homann's sign negative.  Pulses full and equal. Skin:  Warm and dry.  No rash.  Labs   Results for orders placed during the hospital encounter of 11/16/13  POCT I-STAT, CHEM 8      Result Value Ref Range   Sodium 142  137 - 147 mEq/L   Potassium 3.5 (*) 3.7 - 5.3 mEq/L   Chloride 99  96 - 112 mEq/L   BUN 10  6 - 23 mg/dL   Creatinine, Ser 1.00  0.50 - 1.10 mg/dL   Glucose, Bld 110 (*) 70 - 99 mg/dL   Calcium, Ion 1.08 (*) 1.13 - 1.30 mmol/L   TCO2 28  0 - 100 mmol/L   Hemoglobin 14.6  12.0 - 15.0 g/dL   HCT 43.0  36.0 - 46.0 %     Radiology   Dg Chest 2 View  11/16/2013   CLINICAL DATA:  Shortness of breath  EXAM: CHEST  2 VIEW  COMPARISON:  4/19/fifth  FINDINGS: Cardiac shadow is stable. Postsurgical changes are again noted on the right. Multiple rib fractures are seen on the right. No focal infiltrate or sizable effusion is seen.  IMPRESSION: No acute abnormality noted.   Electronically Signed   By: Inez Catalina M.D.   On: 11/16/2013 10:57    EKG Results:  Date: 11/16/2013  Rate: 84  Rhythm: normal sinus rhythm  QRS Axis: normal  Intervals: normal  ST/T Wave abnormalities: normal  Conduction Disutrbances:none  Narrative  Interpretation: Normal sinus rhythm, normal EKG.  Old EKG Reviewed: none available   Course in [facility] [place]   She was given a DuoNeb breathing treatment and felt better afterwards. Her lungs sounded completely clear both before and after the breathing treatment.  Assessment   The encounter diagnosis was Acute bronchitis.  No evidence of cardiopulmonary disease. She did get some relief with DuoNeb breathing treatment. I reluctantly give her corticosteroids since she had such a severe reaction to them. For right now will treat with Proventil by nebulizer, she was given a Qvar inhaler, since the systemic absorption of this should be minimal, and doxycycline. Followup with Dr. Asa Lente next week. The patient was strongly encouraged to return if she should feel worse in any way.  Plan    1.  Meds:  The following meds were prescribed:   Discharge Medication List as of 11/16/2013 11:10 AM    START taking these medications   Details  albuterol (PROVENTIL) (2.5 MG/3ML) 0.083% nebulizer solution Take 3 mLs (2.5 mg total) by nebulization every 6 (six) hours as needed for wheezing., Starting 11/16/2013, Until Discontinued, Normal    beclomethasone (QVAR) 80 MCG/ACT inhaler Inhale 2 puffs into the lungs 2 (two) times daily., Starting 11/16/2013, Until Discontinued, Normal    doxycycline (VIBRA-TABS) 100 MG tablet Take 1 tablet (100 mg total) by mouth 2 (two) times daily., Starting 11/16/2013, Until Discontinued, Normal        2.  Patient Education/Counseling:  The patient was given appropriate handouts, self care instructions, and instructed in symptomatic relief.    3.  Follow up:  The patient was told to follow up here if no better in 3 to 4 days, or sooner if becoming worse in any way, and given some red flag symptoms such as chest pain, worsening difficulty breathing, or syncope which would prompt immediate return.  Followup with Dr. Asa Lente next week.      Harden Mo, MD 11/16/13  [phone]

## 2013-11-16 NOTE — ED Notes (Signed)
Pt reports  3rd day of SOB.   She has had a nonproductive cough and nasal congestion 2 days  without fever.

## 2013-11-19 ENCOUNTER — Ambulatory Visit (INDEPENDENT_AMBULATORY_CARE_PROVIDER_SITE_OTHER): Payer: Medicare Other | Admitting: Internal Medicine

## 2013-11-19 ENCOUNTER — Encounter: Payer: Self-pay | Admitting: Internal Medicine

## 2013-11-19 VITALS — BP 126/82 | HR 100 | Temp 97.6°F | Wt 288.8 lb

## 2013-11-19 DIAGNOSIS — E785 Hyperlipidemia, unspecified: Secondary | ICD-10-CM

## 2013-11-19 DIAGNOSIS — I10 Essential (primary) hypertension: Secondary | ICD-10-CM

## 2013-11-19 DIAGNOSIS — J209 Acute bronchitis, unspecified: Secondary | ICD-10-CM | POA: Diagnosis not present

## 2013-11-19 DIAGNOSIS — M79609 Pain in unspecified limb: Secondary | ICD-10-CM

## 2013-11-19 DIAGNOSIS — E039 Hypothyroidism, unspecified: Secondary | ICD-10-CM

## 2013-11-19 DIAGNOSIS — M79604 Pain in right leg: Secondary | ICD-10-CM

## 2013-11-19 NOTE — Assessment & Plan Note (Signed)
Started Tribenzor 04/17/12 -Improved - Ok to continue Lasix daily for edema The current medical regimen is effective;  continue present plan and medications. BP Readings from Last 3 Encounters:  11/19/13 126/82  11/16/13 125/63  10/05/13 145/78

## 2013-11-19 NOTE — Assessment & Plan Note (Signed)
Recurrent symptoms - recent UC eval 5/31 for same - improved with doxy The current medical regimen is effective;  continue present plan and medications.

## 2013-11-19 NOTE — Progress Notes (Signed)
Pre visit review using our clinic review tool, if applicable. No additional management support is needed unless otherwise documented below in the visit note. 

## 2013-11-19 NOTE — Assessment & Plan Note (Signed)
Lab Results  Component Value Date   TSH 6.893* 09/05/2013   The current medical regimen is effective;  continue present plan and medications. Check tsh semi annually and adjust as needed

## 2013-11-19 NOTE — Assessment & Plan Note (Signed)
Prior PCP stopped Lipitor 07/2009 -  reminded to work on diet/exercise and weight control to manage same Check lipids annually

## 2013-11-19 NOTE — Progress Notes (Signed)
Subjective:    Patient ID: Tamara Morales, female    DOB: [birth year], 80 y.o.   MRN: [id number]  HPI  Patient is here for follow up  Reviewed chronic medical issues and interval medical events  Past Medical History  Diagnosis Date  . Morbid obesity   . MIGRAINE HEADACHE   . ARTHRITIS, KNEES, BILATERAL   . DEPRESSION   . GERD   . HYPERLIPIDEMIA   . HYPERTENSION   . Breast CA 2000    s/p R lumpectomy and XRT  . DJD (degenerative joint disease) of knee   . Urge incontinence   . HYPOTHYROIDISM     postsurgical  . OSA (obstructive sleep apnea) 01/17/2011 dx    Review of Systems  Constitutional: Positive for fatigue. Negative for fever and unexpected weight change.  Respiratory: Positive for cough (but improved since starting doxy 5/31). Negative for wheezing.   Cardiovascular: Positive for leg swelling (chronic). Negative for chest pain and palpitations.  Gastrointestinal: Positive for nausea (due to abx).       Objective:   Physical Exam  BP 126/82  Pulse 100  Temp(Src) 97.6 F (36.4 C) (Oral)  Wt 288 lb 12.8 oz (130.999 kg)  SpO2 93% Wt Readings from Last 3 Encounters:  11/19/13 288 lb 12.8 oz (130.999 kg)  10/05/13 300 lb (136.079 kg)  08/29/13 287 lb 9.6 oz (130.455 kg)   Constitutional: She is MO, but appears well-developed and well-nourished. No distress.  Neck: Normal range of motion. Neck supple. No JVD present. No thyromegaly present.  Cardiovascular: Normal rate, regular rhythm and normal heart sounds.  No murmur heard. chronic BLE edema. Pulmonary/Chest: Effort normal and breath sounds normal. No respiratory distress. She has no wheezes.  MSkel: chronic venous insufficiency with hemosiderin deposit bilateral distal extremities, right greater than left Skin: varicose veins changes distal bilateral extremities, status post stripping on right side  Psychiatric: She has a normal mood and affect. Her behavior is normal. Judgment and thought content normal.    Lab Results  Component Value Date   WBC 7.2 09/05/2013   HGB 14.6 11/16/2013   HCT 43.0 11/16/2013   PLT 162 09/05/2013   GLUCOSE 110* 11/16/2013   CHOL 196 02/28/2013   TRIG 148.0 02/28/2013   HDL 40.60 02/28/2013   LDLDIRECT 99.4 03/13/2012   LDLCALC 126* 02/28/2013   ALT 12 09/05/2013   AST 13 09/05/2013   NA 142 11/16/2013   K 3.5* 11/16/2013   CL 99 11/16/2013   CREATININE 1.00 11/16/2013   BUN 10 11/16/2013   CO2 25 09/05/2013   TSH 6.893* 09/05/2013   INR 1.02 03/20/2010   HGBA1C 6.2 05/21/2013    Dg Chest 2 View  11/16/2013   CLINICAL DATA:  Shortness of breath  EXAM: CHEST  2 VIEW  COMPARISON:  4/19/fifth  FINDINGS: Cardiac shadow is stable. Postsurgical changes are again noted on the right. Multiple rib fractures are seen on the right. No focal infiltrate or sizable effusion is seen.  IMPRESSION: No acute abnormality noted.   Electronically Signed   By: Inez Catalina M.D.   On: 11/16/2013 10:57       Assessment & Plan:   RLE pain - suspect varicose veins symptoms but check ABI rule out claudication - Unable to wear compression has because of anatomy and rolling down -continue treatment for CRF as rx'd  Problem List Items Addressed This Visit   Acute bronchitis     Recurrent symptoms - recent UC eval 5/31  for same - improved with doxy The current medical regimen is effective;  continue present plan and medications.     HYPERLIPIDEMIA     Prior PCP stopped Lipitor 07/2009 -  reminded to work on diet/exercise and weight control to manage same Check lipids annually    Relevant Orders      Lower Extremity Arterial Doppler Bilateral   HYPERTENSION      Started Tribenzor 04/17/12 -Improved - Ok to continue Lasix daily for edema The current medical regimen is effective;  continue present plan and medications. BP Readings from Last 3 Encounters:  11/19/13 126/82  11/16/13 125/63  10/05/13 145/78      Relevant Orders      Lower Extremity Arterial Doppler Bilateral    HYPOTHYROIDISM      Lab Results  Component Value Date   TSH 6.893* 09/05/2013   The current medical regimen is effective;  continue present plan and medications. Check tsh semi annually and adjust as needed     Other Visit Diagnoses   Right leg pain    -  Primary    Relevant Orders       Lower Extremity Arterial Doppler Bilateral

## 2013-11-19 NOTE — Patient Instructions (Signed)
It was good to see you today.  We have reviewed your prior records including labs and tests today  Refer for ABI (blood flow test). My office will call once this is arranged. Your results will be released to [place] (or called to you) after review, usually within 72hours after test completion. If any changes need to be made, you will be notified at that same time.  Medications reviewed and updated, no changes recommended at this time.  Please schedule followup in 6 months, call sooner if problems.

## 2013-11-25 ENCOUNTER — Ambulatory Visit (HOSPITAL_COMMUNITY): Payer: Medicare Other | Attending: Cardiovascular Disease | Admitting: Cardiology

## 2013-11-25 DIAGNOSIS — I10 Essential (primary) hypertension: Secondary | ICD-10-CM | POA: Diagnosis not present

## 2013-11-25 DIAGNOSIS — M79604 Pain in right leg: Secondary | ICD-10-CM

## 2013-11-25 DIAGNOSIS — Z87891 Personal history of nicotine dependence: Secondary | ICD-10-CM | POA: Diagnosis not present

## 2013-11-25 DIAGNOSIS — E785 Hyperlipidemia, unspecified: Secondary | ICD-10-CM

## 2013-11-25 DIAGNOSIS — I70229 Atherosclerosis of native arteries of extremities with rest pain, unspecified extremity: Secondary | ICD-10-CM | POA: Diagnosis not present

## 2013-11-25 DIAGNOSIS — I739 Peripheral vascular disease, unspecified: Secondary | ICD-10-CM

## 2013-11-25 NOTE — Progress Notes (Signed)
Lower arterial doppler completed.

## 2013-11-27 ENCOUNTER — Encounter: Payer: Self-pay | Admitting: Internal Medicine

## 2013-12-01 ENCOUNTER — Other Ambulatory Visit: Payer: Self-pay | Admitting: Internal Medicine

## 2013-12-05 ENCOUNTER — Ambulatory Visit (INDEPENDENT_AMBULATORY_CARE_PROVIDER_SITE_OTHER): Payer: Medicare Other | Admitting: Pulmonary Disease

## 2013-12-05 ENCOUNTER — Encounter: Payer: Self-pay | Admitting: Pulmonary Disease

## 2013-12-05 VITALS — BP 136/86 | HR 99 | Temp 98.4°F | Ht 66.0 in | Wt 295.0 lb

## 2013-12-05 DIAGNOSIS — R0989 Other specified symptoms and signs involving the circulatory and respiratory systems: Secondary | ICD-10-CM

## 2013-12-05 DIAGNOSIS — R0600 Dyspnea, unspecified: Secondary | ICD-10-CM

## 2013-12-05 DIAGNOSIS — R0609 Other forms of dyspnea: Secondary | ICD-10-CM | POA: Diagnosis not present

## 2013-12-05 NOTE — Progress Notes (Signed)
   Subjective:    Patient ID: Tamara Morales, female    DOB: Jun 02, 1931, 80 y.o.   MRN: [id number]  HPI The patient comes in today for an acute sick visit. She has a history of sleep apnea, as well as chronic dyspnea on exertion with no specific etiology being found in the past. She has had a complete pulmonary workup in the past with nothing being found, and comes in today with a three-month history of worsening shortness of breath. She was actually in the hospital in March and had a CT of her chest without pulmonary embolus or acute process. She also was seen in the emergency room where a chest x-ray the end of last month showed no acute process. She has been treated with multiple inhalers and inhaled corticosteroids, but PFTs in the past have never shown obstructive lung disease. She does not think the inhalers have made a difference to her breathing. She has had worsening lower extremity edema, and no recent cardiac workup.  Her recent CBC shows no anemia, but her TSH has been elevated.   Review of Systems  Constitutional: Negative for fever and unexpected weight change.  HENT: Negative for congestion, dental problem, ear pain, nosebleeds, postnasal drip, rhinorrhea, sinus pressure, sneezing, sore throat and trouble swallowing.   Eyes: Negative for redness and itching.  Respiratory: Positive for cough, chest tightness and shortness of breath. Negative for wheezing.   Cardiovascular: Negative for palpitations and leg swelling.  Gastrointestinal: Negative for nausea and vomiting.  Genitourinary: Negative for dysuria.  Musculoskeletal: Negative for joint swelling.  Skin: Negative for rash.  Neurological: Negative for headaches.  Hematological: Does not bruise/bleed easily.  Psychiatric/Behavioral: Negative for dysphoric mood. The patient is not nervous/anxious.        Objective:   Physical Exam Morbidly obese female in no acute distress Nose without purulence or discharge noted Neck  without lymphadenopathy or thyromegaly Chest totally clear to auscultation, no wheezing Cardiac exam with regular rate and rhythm Lower extremities with 3+ edema, no cyanosis Alert and oriented, moves all 4 extremities.       Assessment & Plan:

## 2013-12-05 NOTE — Assessment & Plan Note (Signed)
The patient continues to have significant dyspnea on exertion, but there is no obvious pulmonary issues to address. She had a negative CT angiogram March PE or any other lung issue. She has had a recent chest x-ray that is totally clear, and her spirometry today shows restriction from her obesity but no airflow obstruction. She has been on inhalers, and has seen no difference in her breathing, because of this, combined with no obstruction on spirometry, I have asked her to discontinue all of her inhaled medication. I do not see a pulmonary reason for her shortness of breath, but suspect her obesity and deconditioning are major players. Would also consider whether her elevated TSH and worsening lower extremity edema could be playing a role. It does not appear that she has had a recent cardiac workup, and perhaps this needs to be done?

## 2013-12-05 NOTE — Patient Instructions (Signed)
Stop all inhalers.  You do not have abnormal airflows on your breathing tests. Can try to increase your acid reflux medicine to twice a day Work on weight loss, and discuss further workup of your shortness of breath with your primary physician

## 2013-12-11 DIAGNOSIS — Z961 Presence of intraocular lens: Secondary | ICD-10-CM | POA: Diagnosis not present

## 2013-12-11 DIAGNOSIS — H2640 Unspecified secondary cataract: Secondary | ICD-10-CM | POA: Diagnosis not present

## 2013-12-11 DIAGNOSIS — H01009 Unspecified blepharitis unspecified eye, unspecified eyelid: Secondary | ICD-10-CM | POA: Diagnosis not present

## 2013-12-11 DIAGNOSIS — H43819 Vitreous degeneration, unspecified eye: Secondary | ICD-10-CM | POA: Diagnosis not present

## 2013-12-17 ENCOUNTER — Other Ambulatory Visit: Payer: Self-pay | Admitting: Internal Medicine

## 2013-12-18 ENCOUNTER — Other Ambulatory Visit: Payer: Self-pay | Admitting: General Practice

## 2013-12-18 MED ORDER — POTASSIUM CHLORIDE CRYS ER 10 MEQ PO TBCR: 20.0000 meq | EXTENDED_RELEASE_TABLET | Freq: Every day | ORAL | Status: DC

## 2013-12-22 ENCOUNTER — Encounter: Payer: Self-pay | Admitting: Internal Medicine

## 2013-12-22 ENCOUNTER — Ambulatory Visit (INDEPENDENT_AMBULATORY_CARE_PROVIDER_SITE_OTHER): Payer: Medicare Other | Admitting: Internal Medicine

## 2013-12-22 ENCOUNTER — Other Ambulatory Visit (INDEPENDENT_AMBULATORY_CARE_PROVIDER_SITE_OTHER): Payer: Medicare Other

## 2013-12-22 VITALS — BP 120/72 | HR 90 | Temp 97.5°F | Ht 66.0 in | Wt 297.0 lb

## 2013-12-22 DIAGNOSIS — E039 Hypothyroidism, unspecified: Secondary | ICD-10-CM

## 2013-12-22 DIAGNOSIS — F3289 Other specified depressive episodes: Secondary | ICD-10-CM

## 2013-12-22 DIAGNOSIS — R0989 Other specified symptoms and signs involving the circulatory and respiratory systems: Secondary | ICD-10-CM | POA: Diagnosis not present

## 2013-12-22 DIAGNOSIS — R0600 Dyspnea, unspecified: Secondary | ICD-10-CM

## 2013-12-22 DIAGNOSIS — F329 Major depressive disorder, single episode, unspecified: Secondary | ICD-10-CM | POA: Diagnosis not present

## 2013-12-22 DIAGNOSIS — R0609 Other forms of dyspnea: Secondary | ICD-10-CM

## 2013-12-22 DIAGNOSIS — I10 Essential (primary) hypertension: Secondary | ICD-10-CM

## 2013-12-22 DIAGNOSIS — E785 Hyperlipidemia, unspecified: Secondary | ICD-10-CM

## 2013-12-22 LAB — TSH: TSH: 4.13 u[IU]/mL (ref 0.35–4.50)

## 2013-12-22 MED ORDER — LEVOTHYROXINE SODIUM 175 MCG PO TABS: 175.0000 ug | ORAL_TABLET | Freq: Every day | ORAL | Status: DC

## 2013-12-22 MED ORDER — FUROSEMIDE 40 MG PO TABS: 40.0000 mg | ORAL_TABLET | Freq: Every day | ORAL | Status: DC

## 2013-12-22 NOTE — Assessment & Plan Note (Signed)
Exacerbated by stressors (spouse with dementia and increasing weight) On BZ prn - no longer on SSRI Refer to psyc, not counseling, per pt request to help manage same

## 2013-12-22 NOTE — Progress Notes (Signed)
Pre visit review using our clinic review tool, if applicable. No additional management support is needed unless otherwise documented below in the visit note. 

## 2013-12-22 NOTE — Assessment & Plan Note (Addendum)
Lab Results  Component Value Date   TSH 6.893* 09/05/2013   Increase dose now Check tsh semi annually and adjust as needed

## 2013-12-22 NOTE — Assessment & Plan Note (Signed)
Wt Readings from Last 3 Encounters:  12/22/13 297 lb (134.718 kg)  12/05/13 295 lb (133.811 kg)  11/19/13 288 lb 12.8 oz (130.999 kg)   The patient is asked to make an attempt to improve diet and exercise patterns to aid in medical management of this problem.

## 2013-12-22 NOTE — Assessment & Plan Note (Signed)
Extensive pulm eval and other testing reviewed with pt today-  Chronic symptoms exac by obesity and deconditioning Will also refer for cardiac eval with stress test now

## 2013-12-22 NOTE — Patient Instructions (Signed)
It was good to see you today.  We have reviewed your prior records including labs and tests today  Test(s) ordered today. Your results will be released to [place] (or called to you) after review, usually within 72hours after test completion. If any changes need to be made, you will be notified at that same time.  Medications reviewed and updated: Increase Synthroid to 175 mcg daily Increase Lasix to 40 mg daily No other changes recommended at this time. Refill on medication(s) as discussed today.  we'll make referral to psychiatrist and refer for physical therapy as requested. Will also refer for cardiac stress test. Our office will contact you regarding appointment(s) once made.  Please schedule followup in 6 weeks, call sooner if problems.

## 2013-12-22 NOTE — Progress Notes (Signed)
Subjective:    Patient ID: Tamara Morales, female    DOB: [birth year], 80 y.o.   MRN: [id number]  HPI  Patient is here for follow up  Reviewed chronic medical issues and interval medical events  Past Medical History  Diagnosis Date  . Morbid obesity   . MIGRAINE HEADACHE   . ARTHRITIS, KNEES, BILATERAL   . DEPRESSION   . GERD   . HYPERLIPIDEMIA   . HYPERTENSION   . Breast CA 2000    s/p R lumpectomy and XRT  . DJD (degenerative joint disease) of knee   . Urge incontinence   . HYPOTHYROIDISM     postsurgical  . OSA (obstructive sleep apnea) 01/17/2011 dx    Review of Systems  Constitutional: Positive for fatigue and unexpected weight change (ongoing gain). Negative for fever, chills and activity change.  Respiratory: Positive for cough and shortness of breath (chronic).   Musculoskeletal: Positive for arthralgias (diffuse).  Psychiatric/Behavioral: Positive for dysphoric mood (upset about weight gain). Negative for suicidal ideas. The patient is not nervous/anxious.        Objective:   Physical Exam  BP 120/72  Pulse 90  Temp(Src) 97.5 F (36.4 C) (Oral)  Ht $R'5\' 6"'ML$  (1.676 m)  Wt 297 lb (134.718 kg)  BMI 47.96 kg/m2  SpO2 96% Wt Readings from Last 3 Encounters:  12/22/13 297 lb (134.718 kg)  12/05/13 295 lb (133.811 kg)  11/19/13 288 lb 12.8 oz (130.999 kg)   Constitutional: She is MO, appears well-developed and well-nourished. No distress.  Neck: Normal range of motion. Neck supple. No JVD present. No thyromegaly present.  Cardiovascular: Normal rate, regular rhythm and normal heart sounds.  No murmur heard. chronic BLE edema. Pulmonary/Chest: Effort normal and breath sounds diminished at bases. No respiratory distress. She has no wheezes.  Psychiatric: She has a normal mood and affect. Her behavior is normal. Judgment and thought content normal.   Lab Results  Component Value Date   WBC 7.2 09/05/2013   HGB 14.6 11/16/2013   HCT 43.0 11/16/2013   PLT 162  09/05/2013   GLUCOSE 110* 11/16/2013   CHOL 196 02/28/2013   TRIG 148.0 02/28/2013   HDL 40.60 02/28/2013   LDLDIRECT 99.4 03/13/2012   LDLCALC 126* 02/28/2013   ALT 12 09/05/2013   AST 13 09/05/2013   NA 142 11/16/2013   K 3.5* 11/16/2013   CL 99 11/16/2013   CREATININE 1.00 11/16/2013   BUN 10 11/16/2013   CO2 25 09/05/2013   TSH 6.893* 09/05/2013   INR 1.02 03/20/2010   HGBA1C 6.2 05/21/2013    Dg Chest 2 View  11/16/2013   CLINICAL DATA:  Shortness of breath  EXAM: CHEST  2 VIEW  COMPARISON:  4/19/fifth  FINDINGS: Cardiac shadow is stable. Postsurgical changes are again noted on the right. Multiple rib fractures are seen on the right. No focal infiltrate or sizable effusion is seen.  IMPRESSION: No acute abnormality noted.   Electronically Signed   By: Inez Catalina M.D.   On: 11/16/2013 10:57       Assessment & Plan:   Problem List Items Addressed This Visit   DEPRESSION     Exacerbated by stressors (spouse with dementia and increasing weight) On BZ prn - no longer on SSRI Refer to psyc, not counseling, per pt request to help manage same    Relevant Orders      Ambulatory referral to Psychiatry   DOE (dyspnea on exertion) - Primary  Extensive pulm eval and other testing reviewed with pt today-  Chronic symptoms exac by obesity and deconditioning Will also refer for cardiac eval with stress test now    Relevant Orders      Myocardial Perfusion Imaging      Ambulatory referral to Physical Therapy   HYPERLIPIDEMIA   Relevant Medications      furosemide (LASIX) tablet   Other Relevant Orders      Myocardial Perfusion Imaging   HYPERTENSION   Relevant Medications      furosemide (LASIX) tablet   Other Relevant Orders      Myocardial Perfusion Imaging      Ambulatory referral to Physical Therapy   HYPOTHYROIDISM      Lab Results  Component Value Date   TSH 6.893* 09/05/2013   Increase dose now Check tsh semi annually and adjust as needed    Relevant Medications       levothyroxine (SYNTHROID, LEVOTHROID) tablet   Other Relevant Orders      TSH      Ambulatory referral to Physical Therapy   Morbid obesity      Wt Readings from Last 3 Encounters:  12/22/13 297 lb (134.718 kg)  12/05/13 295 lb (133.811 kg)  11/19/13 288 lb 12.8 oz (130.999 kg)   The patient is asked to make an attempt to improve diet and exercise patterns to aid in medical management of this problem.     Relevant Orders      Ambulatory referral to Physical Therapy

## 2013-12-23 ENCOUNTER — Telehealth: Payer: Self-pay | Admitting: Internal Medicine

## 2013-12-23 NOTE — Telephone Encounter (Signed)
Relevant patient education assigned to patient using Emmi. ° °

## 2013-12-31 DIAGNOSIS — M6281 Muscle weakness (generalized): Secondary | ICD-10-CM | POA: Diagnosis not present

## 2013-12-31 DIAGNOSIS — M25559 Pain in unspecified hip: Secondary | ICD-10-CM | POA: Diagnosis not present

## 2013-12-31 DIAGNOSIS — M79609 Pain in unspecified limb: Secondary | ICD-10-CM | POA: Diagnosis not present

## 2014-01-02 DIAGNOSIS — M25559 Pain in unspecified hip: Secondary | ICD-10-CM | POA: Diagnosis not present

## 2014-01-02 DIAGNOSIS — M79609 Pain in unspecified limb: Secondary | ICD-10-CM | POA: Diagnosis not present

## 2014-01-02 DIAGNOSIS — M6281 Muscle weakness (generalized): Secondary | ICD-10-CM | POA: Diagnosis not present

## 2014-01-05 DIAGNOSIS — M6281 Muscle weakness (generalized): Secondary | ICD-10-CM | POA: Diagnosis not present

## 2014-01-05 DIAGNOSIS — M25559 Pain in unspecified hip: Secondary | ICD-10-CM | POA: Diagnosis not present

## 2014-01-05 DIAGNOSIS — M79609 Pain in unspecified limb: Secondary | ICD-10-CM | POA: Diagnosis not present

## 2014-01-07 DIAGNOSIS — M6281 Muscle weakness (generalized): Secondary | ICD-10-CM | POA: Diagnosis not present

## 2014-01-07 DIAGNOSIS — M25559 Pain in unspecified hip: Secondary | ICD-10-CM | POA: Diagnosis not present

## 2014-01-07 DIAGNOSIS — M79609 Pain in unspecified limb: Secondary | ICD-10-CM | POA: Diagnosis not present

## 2014-01-09 DIAGNOSIS — M79609 Pain in unspecified limb: Secondary | ICD-10-CM | POA: Diagnosis not present

## 2014-01-09 DIAGNOSIS — M25559 Pain in unspecified hip: Secondary | ICD-10-CM | POA: Diagnosis not present

## 2014-01-09 DIAGNOSIS — M6281 Muscle weakness (generalized): Secondary | ICD-10-CM | POA: Diagnosis not present

## 2014-01-12 DIAGNOSIS — F4321 Adjustment disorder with depressed mood: Secondary | ICD-10-CM | POA: Diagnosis not present

## 2014-01-13 DIAGNOSIS — M6281 Muscle weakness (generalized): Secondary | ICD-10-CM | POA: Diagnosis not present

## 2014-01-13 DIAGNOSIS — M79609 Pain in unspecified limb: Secondary | ICD-10-CM | POA: Diagnosis not present

## 2014-01-13 DIAGNOSIS — M25559 Pain in unspecified hip: Secondary | ICD-10-CM | POA: Diagnosis not present

## 2014-01-14 DIAGNOSIS — M79609 Pain in unspecified limb: Secondary | ICD-10-CM | POA: Diagnosis not present

## 2014-01-14 DIAGNOSIS — M6281 Muscle weakness (generalized): Secondary | ICD-10-CM | POA: Diagnosis not present

## 2014-01-14 DIAGNOSIS — M25559 Pain in unspecified hip: Secondary | ICD-10-CM | POA: Diagnosis not present

## 2014-01-16 DIAGNOSIS — M25559 Pain in unspecified hip: Secondary | ICD-10-CM | POA: Diagnosis not present

## 2014-01-16 DIAGNOSIS — M79609 Pain in unspecified limb: Secondary | ICD-10-CM | POA: Diagnosis not present

## 2014-01-16 DIAGNOSIS — M6281 Muscle weakness (generalized): Secondary | ICD-10-CM | POA: Diagnosis not present

## 2014-01-19 DIAGNOSIS — R262 Difficulty in walking, not elsewhere classified: Secondary | ICD-10-CM | POA: Diagnosis not present

## 2014-01-19 DIAGNOSIS — M79609 Pain in unspecified limb: Secondary | ICD-10-CM | POA: Diagnosis not present

## 2014-01-19 DIAGNOSIS — R609 Edema, unspecified: Secondary | ICD-10-CM | POA: Diagnosis not present

## 2014-01-21 DIAGNOSIS — R262 Difficulty in walking, not elsewhere classified: Secondary | ICD-10-CM | POA: Diagnosis not present

## 2014-01-21 DIAGNOSIS — M79609 Pain in unspecified limb: Secondary | ICD-10-CM | POA: Diagnosis not present

## 2014-01-21 DIAGNOSIS — F4321 Adjustment disorder with depressed mood: Secondary | ICD-10-CM | POA: Diagnosis not present

## 2014-01-21 DIAGNOSIS — R609 Edema, unspecified: Secondary | ICD-10-CM | POA: Diagnosis not present

## 2014-01-23 DIAGNOSIS — R609 Edema, unspecified: Secondary | ICD-10-CM | POA: Diagnosis not present

## 2014-01-23 DIAGNOSIS — R262 Difficulty in walking, not elsewhere classified: Secondary | ICD-10-CM | POA: Diagnosis not present

## 2014-01-23 DIAGNOSIS — M79609 Pain in unspecified limb: Secondary | ICD-10-CM | POA: Diagnosis not present

## 2014-01-26 DIAGNOSIS — R262 Difficulty in walking, not elsewhere classified: Secondary | ICD-10-CM | POA: Diagnosis not present

## 2014-01-26 DIAGNOSIS — R609 Edema, unspecified: Secondary | ICD-10-CM | POA: Diagnosis not present

## 2014-01-26 DIAGNOSIS — M79609 Pain in unspecified limb: Secondary | ICD-10-CM | POA: Diagnosis not present

## 2014-01-27 DIAGNOSIS — F4321 Adjustment disorder with depressed mood: Secondary | ICD-10-CM | POA: Diagnosis not present

## 2014-01-28 ENCOUNTER — Encounter: Payer: Self-pay | Admitting: Internal Medicine

## 2014-01-28 ENCOUNTER — Encounter (HOSPITAL_COMMUNITY): Payer: Self-pay

## 2014-01-28 DIAGNOSIS — M79609 Pain in unspecified limb: Secondary | ICD-10-CM | POA: Diagnosis not present

## 2014-01-28 DIAGNOSIS — R609 Edema, unspecified: Secondary | ICD-10-CM | POA: Diagnosis not present

## 2014-01-28 DIAGNOSIS — R262 Difficulty in walking, not elsewhere classified: Secondary | ICD-10-CM | POA: Diagnosis not present

## 2014-01-28 MED ORDER — ESOMEPRAZOLE MAGNESIUM 40 MG PO CPDR: 40.0000 mg | DELAYED_RELEASE_CAPSULE | Freq: Two times a day (BID) | ORAL | Status: DC

## 2014-01-29 ENCOUNTER — Encounter: Payer: Self-pay | Admitting: Family Medicine

## 2014-01-29 ENCOUNTER — Ambulatory Visit (INDEPENDENT_AMBULATORY_CARE_PROVIDER_SITE_OTHER): Payer: Medicare Other | Admitting: Family Medicine

## 2014-01-29 VITALS — BP 118/80 | HR 90 | Temp 97.6°F | Ht 66.0 in | Wt 296.0 lb

## 2014-01-29 DIAGNOSIS — R609 Edema, unspecified: Secondary | ICD-10-CM

## 2014-01-29 LAB — COMPREHENSIVE METABOLIC PANEL
ALBUMIN: 3.4 g/dL — AB (ref 3.5–5.2)
ALT: 14 U/L (ref 0–35)
AST: 19 U/L (ref 0–37)
Alkaline Phosphatase: 80 U/L (ref 39–117)
BUN: 18 mg/dL (ref 6–23)
CHLORIDE: 101 meq/L (ref 96–112)
CO2: 29 meq/L (ref 19–32)
Calcium: 8.9 mg/dL (ref 8.4–10.5)
Creatinine, Ser: 1 mg/dL (ref 0.4–1.2)
GFR: 56.5 mL/min — AB (ref >60.00)
GLUCOSE: 104 mg/dL — AB (ref 70–99)
Potassium: 3.3 mEq/L — ABNORMAL LOW (ref 3.5–5.1)
SODIUM: 141 meq/L (ref 135–145)
TOTAL PROTEIN: 6.7 g/dL (ref 6.0–8.3)
Total Bilirubin: 0.4 mg/dL (ref 0.2–1.2)

## 2014-01-29 LAB — CBC WITH DIFFERENTIAL/PLATELET
BASOS PCT: 0.4 % (ref 0.0–3.0)
Basophils Absolute: 0 10*3/uL (ref 0.0–0.1)
Eosinophils Absolute: 0.2 10*3/uL (ref 0.0–0.7)
Eosinophils Relative: 3.2 % (ref 0.0–5.0)
HCT: 39.7 % (ref 36.0–46.0)
Hemoglobin: 13.5 g/dL (ref 12.0–15.0)
Lymphocytes Relative: 23 % (ref 12.0–46.0)
Lymphs Abs: 1.4 10*3/uL (ref 0.7–4.0)
MCHC: 34 g/dL (ref 30.0–36.0)
MCV: 90.2 fl (ref 78.0–100.0)
MONO ABS: 0.5 10*3/uL (ref 0.1–1.0)
Monocytes Relative: 7.9 % (ref 3.0–12.0)
NEUTROS PCT: 65.5 % (ref 43.0–77.0)
Neutro Abs: 4.1 10*3/uL (ref 1.4–7.7)
PLATELETS: 182 10*3/uL (ref 150.0–400.0)
RBC: 4.4 Mil/uL (ref 3.87–5.11)
RDW: 13.8 % (ref 11.5–15.5)
WBC: 6.3 10*3/uL (ref 4.0–10.5)

## 2014-01-29 LAB — POCT URINALYSIS DIPSTICK
Bilirubin, UA: NEGATIVE
Blood, UA: NEGATIVE
Glucose, UA: NEGATIVE
KETONES UA: NEGATIVE
Nitrite, UA: NEGATIVE
PH UA: 5.5
PROTEIN UA: NEGATIVE
Spec Grav, UA: 1.015
Urobilinogen, UA: 0.2

## 2014-01-29 NOTE — Progress Notes (Signed)
Garret Reddish, MD Phone: [phone]  Subjective:   Tamara Morales is a 80 y.o. year old very pleasant female patient who presents with the following:  Edema Worsening over last month. Had Lasix increased to $RemoveBefo'40mg'ylAoQHJImnn$  daily from $RemoveBe'20mg'FvfpBfPDs$  3 weeks ago. Increased thyroid medication at last visit as well due to elevated TSH. Does have upcoming stress test planned due to some recent history of dyspnea. Despite lasix increase, swelling has continued to worsen or at least not to improve. Feels tightness in both legs. Both legs equal in size. Has not tolerated compression stockings in the past. Patient states she has not had any increasing shortness of breath in this time, orthopnea, or PND.   Review of records-Echocardiogram in 2014 showed normal ejection fraction 55%. NO wall motion abnrmalities.   ROS-No calf tenderness. No fever/chills. Sleeps on 1 pillow typically but occassionally sleeps in recliner (has not increased in recent days)   Past Medical History- panic attacks, OSA, sleep related hypoventilation, 2L 02 at night, depressionn, migraines, hypothyroidism, HLD, HTN, GERD, morbid obesity  Medications- reviewed and updated Current Outpatient Prescriptions  Medication Sig Dispense Refill  . aspirin 81 MG tablet Take 81 mg by mouth every morning.       . dicyclomine (BENTYL) 20 MG tablet Take 1 tablet (20 mg total) by mouth 2 (two) times daily as needed for spasms.  40 tablet  0  . esomeprazole (NEXIUM) 40 MG capsule Take 1 capsule (40 mg total) by mouth 2 (two) times daily before a meal.  180 capsule  3  . FIBER PO Take 2 tablets by mouth every morning.       . furosemide (LASIX) 40 MG tablet Take 1 tablet (40 mg total) by mouth daily.  90 tablet  1  . levothyroxine (SYNTHROID, LEVOTHROID) 175 MCG tablet Take 1 tablet (175 mcg total) by mouth daily before breakfast.  90 tablet  3  . loratadine (CLARITIN) 10 MG tablet Take 10 mg by mouth daily as needed for allergies.       .  Olmesartan-Amlodipine-HCTZ (TRIBENZOR) 20-5-12.5 MG TABS Take 1 tablet by mouth every morning.       . Omega-3 Fatty Acids (FISH OIL) 1200 MG CAPS Take 2 capsules by mouth every morning.       . phenazopyridine (AZO-TABS) 95 MG tablet Take 95 mg by mouth daily as needed (for urinary pain).       . potassium chloride (K-DUR,KLOR-CON) 10 MEQ tablet Take 2 tablets (20 mEq total) by mouth daily.  60 tablet  3  . senna-docusate (SENOKOT-S) 8.6-50 MG per tablet Take 2 tablets by mouth at bedtime as needed for mild constipation.  30 tablet  1  . ALPRAZolam (XANAX) 0.25 MG tablet Take 1 tablet (0.25 mg total) by mouth 2 (two) times daily as needed for anxiety or sleep.  40 tablet  5  . nystatin (MYCOSTATIN/NYSTOP) 100000 UNIT/GM POWD APPLY TO AFFECTED AREA TWICE A DAY  120 g  0   No current facility-administered medications for this visit.    Objective: BP 118/80  Pulse 90  Temp(Src) 97.6 F (36.4 C)  Ht $R'5\' 6"'Yt$  (1.676 m)  Wt 296 lb (134.265 kg)  BMI 47.80 kg/m2 Gen: NAD, resting comfortably in chair Difficult to assess for JVD given obesity CV: RRR no murmurs rubs or gallops Lungs: CTAB no crackles, wheeze, rhonchi Ext: 2+ pitting edema bilaterally, no calf tenderness and size 10 cm below tibial plateau equal Skin: venous stasis changes noted  Assessment/Plan:  Edema Labs as below to evaluate kidney function, albumin, protein in urine, and for anemia. Has had recent echocardiogram without identifiable cardiac cause. I did encourage patient to follow through with the stress test that was ordered previously. Discussed that hypothyroidism could be contributing that would need at least 6 weeks for TSH to equilibrate. Also discussed likely large component of venous insufficiency. Encourage compression stockings but the patient has not been able tolerate them in the past. as legs are equal do not think venous duplex would be beneficial. I asked patient to try to schedule followup with PCP after her  stress test but due to limited availability patient may also follow up in this office. Patient request symptomatic relief so advised twice a day Lasix every other day for a week. Patient had questions about lymphedema which I told her is a possibility but that I doubt this is no reason to suspect malignancy and edema is pitting. We discussed this could be worked up in the future if concern raised for malignancy.  Orders Placed This Encounter  Procedures  . CBC with Differential  . Comprehensive metabolic panel    [place]  . POCT urinalysis dipstick    In house   >50% of 25 minute office visit was spent on counseling (reassurance, patient concerns, instructing on use of compression stockings and importance) and coordination of care

## 2014-01-29 NOTE — Patient Instructions (Signed)
Check labs today- urine, kidney liver electrolytes, blood counts Take 40 mg lasix in morning and 6 hours later every other day for next week for symptomatic relief (as long as potassium ok-we will call you). Advised compression stockings but you declined due to difficulty using them in past.  Advise you to call to schedule your stress test since you initially deferred Let's check in a few days after your stress test (at least 3 days). I would advise this appointment to be with Dr. Asa Lente but I would also be happy to see you if she is not available.  Think this is likely due to valve failure in your veins but need to rule out other causes.

## 2014-01-30 ENCOUNTER — Encounter: Payer: Self-pay | Admitting: Internal Medicine

## 2014-01-30 DIAGNOSIS — M79609 Pain in unspecified limb: Secondary | ICD-10-CM | POA: Diagnosis not present

## 2014-01-30 DIAGNOSIS — R262 Difficulty in walking, not elsewhere classified: Secondary | ICD-10-CM | POA: Diagnosis not present

## 2014-01-30 DIAGNOSIS — R609 Edema, unspecified: Secondary | ICD-10-CM | POA: Diagnosis not present

## 2014-02-02 DIAGNOSIS — M79609 Pain in unspecified limb: Secondary | ICD-10-CM | POA: Diagnosis not present

## 2014-02-02 DIAGNOSIS — R609 Edema, unspecified: Secondary | ICD-10-CM | POA: Diagnosis not present

## 2014-02-02 DIAGNOSIS — R262 Difficulty in walking, not elsewhere classified: Secondary | ICD-10-CM | POA: Diagnosis not present

## 2014-02-02 MED ORDER — OMEPRAZOLE 20 MG PO CPDR: 20.0000 mg | DELAYED_RELEASE_CAPSULE | Freq: Two times a day (BID) | ORAL | Status: DC

## 2014-02-03 ENCOUNTER — Encounter: Payer: Self-pay | Admitting: Internal Medicine

## 2014-02-03 ENCOUNTER — Ambulatory Visit: Payer: Self-pay | Admitting: Internal Medicine

## 2014-02-04 DIAGNOSIS — R262 Difficulty in walking, not elsewhere classified: Secondary | ICD-10-CM | POA: Diagnosis not present

## 2014-02-04 DIAGNOSIS — M79609 Pain in unspecified limb: Secondary | ICD-10-CM | POA: Diagnosis not present

## 2014-02-04 DIAGNOSIS — R609 Edema, unspecified: Secondary | ICD-10-CM | POA: Diagnosis not present

## 2014-02-05 DIAGNOSIS — R262 Difficulty in walking, not elsewhere classified: Secondary | ICD-10-CM | POA: Diagnosis not present

## 2014-02-05 DIAGNOSIS — M79609 Pain in unspecified limb: Secondary | ICD-10-CM | POA: Diagnosis not present

## 2014-02-05 DIAGNOSIS — R609 Edema, unspecified: Secondary | ICD-10-CM | POA: Diagnosis not present

## 2014-02-06 DIAGNOSIS — R262 Difficulty in walking, not elsewhere classified: Secondary | ICD-10-CM | POA: Diagnosis not present

## 2014-02-06 DIAGNOSIS — R609 Edema, unspecified: Secondary | ICD-10-CM | POA: Diagnosis not present

## 2014-02-06 DIAGNOSIS — M79609 Pain in unspecified limb: Secondary | ICD-10-CM | POA: Diagnosis not present

## 2014-02-09 DIAGNOSIS — R609 Edema, unspecified: Secondary | ICD-10-CM | POA: Diagnosis not present

## 2014-02-09 DIAGNOSIS — R262 Difficulty in walking, not elsewhere classified: Secondary | ICD-10-CM | POA: Diagnosis not present

## 2014-02-09 DIAGNOSIS — M79609 Pain in unspecified limb: Secondary | ICD-10-CM | POA: Diagnosis not present

## 2014-02-11 DIAGNOSIS — M79609 Pain in unspecified limb: Secondary | ICD-10-CM | POA: Diagnosis not present

## 2014-02-11 DIAGNOSIS — R262 Difficulty in walking, not elsewhere classified: Secondary | ICD-10-CM | POA: Diagnosis not present

## 2014-02-11 DIAGNOSIS — R609 Edema, unspecified: Secondary | ICD-10-CM | POA: Diagnosis not present

## 2014-02-13 DIAGNOSIS — R262 Difficulty in walking, not elsewhere classified: Secondary | ICD-10-CM | POA: Diagnosis not present

## 2014-02-13 DIAGNOSIS — R609 Edema, unspecified: Secondary | ICD-10-CM | POA: Diagnosis not present

## 2014-02-13 DIAGNOSIS — M79609 Pain in unspecified limb: Secondary | ICD-10-CM | POA: Diagnosis not present

## 2014-02-14 ENCOUNTER — Other Ambulatory Visit: Payer: Self-pay | Admitting: Internal Medicine

## 2014-02-16 DIAGNOSIS — R609 Edema, unspecified: Secondary | ICD-10-CM | POA: Diagnosis not present

## 2014-02-16 DIAGNOSIS — R262 Difficulty in walking, not elsewhere classified: Secondary | ICD-10-CM | POA: Diagnosis not present

## 2014-02-16 DIAGNOSIS — M79609 Pain in unspecified limb: Secondary | ICD-10-CM | POA: Diagnosis not present

## 2014-02-18 DIAGNOSIS — M25559 Pain in unspecified hip: Secondary | ICD-10-CM | POA: Diagnosis not present

## 2014-02-18 DIAGNOSIS — M6281 Muscle weakness (generalized): Secondary | ICD-10-CM | POA: Diagnosis not present

## 2014-02-18 DIAGNOSIS — M79609 Pain in unspecified limb: Secondary | ICD-10-CM | POA: Diagnosis not present

## 2014-02-19 ENCOUNTER — Other Ambulatory Visit: Payer: Self-pay | Admitting: Internal Medicine

## 2014-02-19 DIAGNOSIS — F4321 Adjustment disorder with depressed mood: Secondary | ICD-10-CM | POA: Diagnosis not present

## 2014-02-20 DIAGNOSIS — M6281 Muscle weakness (generalized): Secondary | ICD-10-CM | POA: Diagnosis not present

## 2014-02-20 DIAGNOSIS — M25559 Pain in unspecified hip: Secondary | ICD-10-CM | POA: Diagnosis not present

## 2014-02-20 DIAGNOSIS — M79609 Pain in unspecified limb: Secondary | ICD-10-CM | POA: Diagnosis not present

## 2014-02-23 DIAGNOSIS — M25559 Pain in unspecified hip: Secondary | ICD-10-CM | POA: Diagnosis not present

## 2014-02-23 DIAGNOSIS — M79609 Pain in unspecified limb: Secondary | ICD-10-CM | POA: Diagnosis not present

## 2014-02-23 DIAGNOSIS — M6281 Muscle weakness (generalized): Secondary | ICD-10-CM | POA: Diagnosis not present

## 2014-03-04 DIAGNOSIS — M6281 Muscle weakness (generalized): Secondary | ICD-10-CM | POA: Diagnosis not present

## 2014-03-04 DIAGNOSIS — M25559 Pain in unspecified hip: Secondary | ICD-10-CM | POA: Diagnosis not present

## 2014-03-04 DIAGNOSIS — F4321 Adjustment disorder with depressed mood: Secondary | ICD-10-CM | POA: Diagnosis not present

## 2014-03-04 DIAGNOSIS — M79609 Pain in unspecified limb: Secondary | ICD-10-CM | POA: Diagnosis not present

## 2014-03-06 DIAGNOSIS — M6281 Muscle weakness (generalized): Secondary | ICD-10-CM | POA: Diagnosis not present

## 2014-03-06 DIAGNOSIS — M25559 Pain in unspecified hip: Secondary | ICD-10-CM | POA: Diagnosis not present

## 2014-03-06 DIAGNOSIS — M79609 Pain in unspecified limb: Secondary | ICD-10-CM | POA: Diagnosis not present

## 2014-03-09 DIAGNOSIS — M79609 Pain in unspecified limb: Secondary | ICD-10-CM | POA: Diagnosis not present

## 2014-03-09 DIAGNOSIS — M6281 Muscle weakness (generalized): Secondary | ICD-10-CM | POA: Diagnosis not present

## 2014-03-09 DIAGNOSIS — M25559 Pain in unspecified hip: Secondary | ICD-10-CM | POA: Diagnosis not present

## 2014-03-11 DIAGNOSIS — M25559 Pain in unspecified hip: Secondary | ICD-10-CM | POA: Diagnosis not present

## 2014-03-11 DIAGNOSIS — M6281 Muscle weakness (generalized): Secondary | ICD-10-CM | POA: Diagnosis not present

## 2014-03-11 DIAGNOSIS — M79609 Pain in unspecified limb: Secondary | ICD-10-CM | POA: Diagnosis not present

## 2014-03-12 ENCOUNTER — Telehealth: Payer: Self-pay | Admitting: Internal Medicine

## 2014-03-12 ENCOUNTER — Encounter: Payer: Self-pay | Admitting: Internal Medicine

## 2014-03-12 ENCOUNTER — Ambulatory Visit (INDEPENDENT_AMBULATORY_CARE_PROVIDER_SITE_OTHER): Payer: Medicare Other | Admitting: Internal Medicine

## 2014-03-12 DIAGNOSIS — Z23 Encounter for immunization: Secondary | ICD-10-CM

## 2014-03-12 DIAGNOSIS — G4733 Obstructive sleep apnea (adult) (pediatric): Secondary | ICD-10-CM

## 2014-03-12 DIAGNOSIS — F329 Major depressive disorder, single episode, unspecified: Secondary | ICD-10-CM

## 2014-03-12 DIAGNOSIS — E039 Hypothyroidism, unspecified: Secondary | ICD-10-CM

## 2014-03-12 DIAGNOSIS — F3289 Other specified depressive episodes: Secondary | ICD-10-CM

## 2014-03-12 DIAGNOSIS — I872 Venous insufficiency (chronic) (peripheral): Secondary | ICD-10-CM | POA: Diagnosis not present

## 2014-03-12 NOTE — Assessment & Plan Note (Signed)
Exacerbated by stressors (spouse with dementia and increasing weight) On BZ prn - no longer on SSRI as ineffective Prior therapist -does not feel need to return for same at this time Support offered - no rx changes recommended

## 2014-03-12 NOTE — Assessment & Plan Note (Signed)
Compliance with nocturnal therapy. Continue CPAP with oxygen each bedtime Followup with sleep and annually as needed

## 2014-03-12 NOTE — Telephone Encounter (Signed)
Please see below and it is ok to schedule pt

## 2014-03-12 NOTE — Telephone Encounter (Signed)
Yes, discussed with Dr. Asa Lente earlier today by [employer].

## 2014-03-12 NOTE — Progress Notes (Signed)
Subjective:    Patient ID: Tamara Morales, female    DOB: Nov 12, 1930, 80 y.o.   MRN: [id number]  HPI  Patient is here for follow up  Reviewed chronic medical issues and interval medical events  Past Medical History  Diagnosis Date  . Morbid obesity   . MIGRAINE HEADACHE   . ARTHRITIS, KNEES, BILATERAL   . DEPRESSION   . GERD   . HYPERLIPIDEMIA   . HYPERTENSION   . Breast CA 2000    s/p R lumpectomy and XRT  . DJD (degenerative joint disease) of knee   . Urge incontinence   . HYPOTHYROIDISM     postsurgical  . OSA (obstructive sleep apnea) 01/17/2011 dx    Review of Systems     Objective:   Physical Exam  BP 122/62  Pulse 88  Temp(Src) 97.4 F (36.3 C) (Oral)  Ht $R'5\' 6"'KU$  (1.676 m)  Wt 295 lb 2 oz (133.868 kg)  BMI 47.66 kg/m2  SpO2 94% Wt Readings from Last 3 Encounters:  03/12/14 295 lb 2 oz (133.868 kg)  01/29/14 296 lb (134.265 kg)  12/22/13 297 lb (134.718 kg)   Constitutional: She is MO, appears fatigued well-developed and well-nourished. No distress.  Neck: Normal range of motion. Neck supple. No JVD present. No thyromegaly present.  Cardiovascular: Normal rate, regular rhythm and normal heart sounds.  No murmur heard. trace BLE edema with chronic insuff. Pulmonary/Chest: Effort normal and breath sounds normal. No respiratory distress. She has no wheezes.  Psychiatric: She has a normal mood and affect. Her behavior is normal. Judgment and thought content normal.   Lab Results  Component Value Date   WBC 6.3 01/29/2014   HGB 13.5 01/29/2014   HCT 39.7 01/29/2014   PLT 182.0 01/29/2014   GLUCOSE 104* 01/29/2014   CHOL 196 02/28/2013   TRIG 148.0 02/28/2013   HDL 40.60 02/28/2013   LDLDIRECT 99.4 03/13/2012   LDLCALC 126* 02/28/2013   ALT 14 01/29/2014   AST 19 01/29/2014   NA 141 01/29/2014   K 3.3* 01/29/2014   CL 101 01/29/2014   CREATININE 1.0 01/29/2014   BUN 18 01/29/2014   CO2 29 01/29/2014   TSH 4.13 12/22/2013   INR 1.02 03/20/2010   HGBA1C 6.2  05/21/2013    Dg Chest 2 View  11/16/2013   CLINICAL DATA:  Shortness of breath  EXAM: CHEST  2 VIEW  COMPARISON:  4/19/fifth  FINDINGS: Cardiac shadow is stable. Postsurgical changes are again noted on the right. Multiple rib fractures are seen on the right. No focal infiltrate or sizable effusion is seen.  IMPRESSION: No acute abnormality noted.   Electronically Signed   By: Inez Catalina M.D.   On: 11/16/2013 10:57       Assessment & Plan:   Problem List Items Addressed This Visit   DEPRESSION     Exacerbated by stressors (spouse with dementia and increasing weight) On BZ prn - no longer on SSRI as ineffective Prior therapist -does not feel need to return for same at this time Support offered - no rx changes recommended     HYPOTHYROIDISM      Lab Results  Component Value Date   TSH 4.13 12/22/2013   Increase dose now Check tsh semi annually and adjust as needed    Morbid obesity - Primary      Wt Readings from Last 3 Encounters:  03/12/14 295 lb 2 oz (133.868 kg)  01/29/14 296 lb (134.265 kg)  12/22/13  297 lb (134.718 kg)   The patient is asked to make an attempt to improve diet and exercise patterns to aid in medical management of this problem. Rx for TED to help associated chronic venous insuff and edema    Relevant Orders      DME Other see comment   OSA (obstructive sleep apnea)     Compliance with nocturnal therapy. Continue CPAP with oxygen each bedtime Followup with sleep and annually as needed     Other Visit Diagnoses   Chronic venous insufficiency        Relevant Orders       DME Other see comment    Need for prophylactic vaccination against Streptococcus pneumoniae (pneumococcus)        Relevant Orders       Pneumococcal conjugate vaccine 13-valent (Completed)      Because of my limited clinic availability, patient requests transfer to [place] as that location closer to her residence - Previously saw Dr Rushie Chestnut and requests transfer to his care  -will request of same on her behalf  Followup with our primary care team as ongoing, welcome to return here as needed  Time spent with pt today 25 minutes, greater than 50% time spent counseling patient on diabetes, obesity, venous insuff and medication review. Also review of prior records

## 2014-03-12 NOTE — Telephone Encounter (Signed)
Dr. Asa Lente is requesting patient to be transferred to Dr. Yong Channel.

## 2014-03-12 NOTE — Progress Notes (Signed)
Pre visit review using our clinic review tool, if applicable. No additional management support is needed unless otherwise documented below in the visit note. 

## 2014-03-12 NOTE — Telephone Encounter (Signed)
Dr. Yong Channel are you ok with accepting this pt as your new patient??

## 2014-03-12 NOTE — Assessment & Plan Note (Signed)
Lab Results  Component Value Date   TSH 4.13 12/22/2013   Increase dose now Check tsh semi annually and adjust as needed

## 2014-03-12 NOTE — Assessment & Plan Note (Signed)
Wt Readings from Last 3 Encounters:  03/12/14 295 lb 2 oz (133.868 kg)  01/29/14 296 lb (134.265 kg)  12/22/13 297 lb (134.718 kg)   The patient is asked to make an attempt to improve diet and exercise patterns to aid in medical management of this problem. Rx for TED to help associated chronic venous insuff and edema

## 2014-03-12 NOTE — Telephone Encounter (Signed)
Dr. Hunter please see below 

## 2014-03-12 NOTE — Patient Instructions (Signed)
It was good to see you today.  We have reviewed your prior records including labs and tests today  prevnar vaccine updated today  Medications reviewed and updated, no changes recommended at this time.  Will work to transfer to Dr Yong Channel as requested  Please schedule follow up at [facility]  in 3-6 months, call sooner if problems.

## 2014-03-13 DIAGNOSIS — M25559 Pain in unspecified hip: Secondary | ICD-10-CM | POA: Diagnosis not present

## 2014-03-13 DIAGNOSIS — M6281 Muscle weakness (generalized): Secondary | ICD-10-CM | POA: Diagnosis not present

## 2014-03-13 DIAGNOSIS — M79609 Pain in unspecified limb: Secondary | ICD-10-CM | POA: Diagnosis not present

## 2014-03-13 NOTE — Telephone Encounter (Signed)
Dr. Yong Channel said just find somewhere to fit them in on the same day and if you have to make 34min slots to work them in then that's fine

## 2014-03-13 NOTE — Telephone Encounter (Signed)
Clarification-Stated would want there to max be 4-30 minute appointments per that 1/2 day. If this is not possible, patients need to be scheduled on different days.

## 2014-03-13 NOTE — Telephone Encounter (Signed)
Left message with Ivin Booty to have pt call the office and schedule an appointment with Dr. Yong Channel.  Dr. Yong Channel stated it is okay to schedule pt and spouse in any available spot.

## 2014-03-13 NOTE — Telephone Encounter (Signed)
Dr. Hunter please see below 

## 2014-03-13 NOTE — Telephone Encounter (Signed)
Can you please ask Dr. Yong Channel what day I can work pt's in??  They are going to come on together.

## 2014-03-16 DIAGNOSIS — M6281 Muscle weakness (generalized): Secondary | ICD-10-CM | POA: Diagnosis not present

## 2014-03-16 DIAGNOSIS — M25559 Pain in unspecified hip: Secondary | ICD-10-CM | POA: Diagnosis not present

## 2014-03-16 DIAGNOSIS — M79609 Pain in unspecified limb: Secondary | ICD-10-CM | POA: Diagnosis not present

## 2014-03-18 DIAGNOSIS — M79609 Pain in unspecified limb: Secondary | ICD-10-CM | POA: Diagnosis not present

## 2014-03-18 DIAGNOSIS — M25559 Pain in unspecified hip: Secondary | ICD-10-CM | POA: Diagnosis not present

## 2014-03-18 DIAGNOSIS — M6281 Muscle weakness (generalized): Secondary | ICD-10-CM | POA: Diagnosis not present

## 2014-03-20 DIAGNOSIS — M6281 Muscle weakness (generalized): Secondary | ICD-10-CM | POA: Diagnosis not present

## 2014-03-20 DIAGNOSIS — M79651 Pain in right thigh: Secondary | ICD-10-CM | POA: Diagnosis not present

## 2014-03-20 DIAGNOSIS — M79609 Pain in unspecified limb: Secondary | ICD-10-CM | POA: Diagnosis not present

## 2014-03-20 DIAGNOSIS — R601 Generalized edema: Secondary | ICD-10-CM | POA: Diagnosis not present

## 2014-03-20 DIAGNOSIS — M25511 Pain in right shoulder: Secondary | ICD-10-CM | POA: Diagnosis not present

## 2014-03-23 DIAGNOSIS — M79609 Pain in unspecified limb: Secondary | ICD-10-CM | POA: Diagnosis not present

## 2014-03-23 DIAGNOSIS — M79651 Pain in right thigh: Secondary | ICD-10-CM | POA: Diagnosis not present

## 2014-03-23 DIAGNOSIS — M6281 Muscle weakness (generalized): Secondary | ICD-10-CM | POA: Diagnosis not present

## 2014-03-23 DIAGNOSIS — M25511 Pain in right shoulder: Secondary | ICD-10-CM | POA: Diagnosis not present

## 2014-03-23 DIAGNOSIS — R601 Generalized edema: Secondary | ICD-10-CM | POA: Diagnosis not present

## 2014-03-25 DIAGNOSIS — M79651 Pain in right thigh: Secondary | ICD-10-CM | POA: Diagnosis not present

## 2014-03-25 DIAGNOSIS — R601 Generalized edema: Secondary | ICD-10-CM | POA: Diagnosis not present

## 2014-03-25 DIAGNOSIS — M25511 Pain in right shoulder: Secondary | ICD-10-CM | POA: Diagnosis not present

## 2014-03-25 DIAGNOSIS — M79609 Pain in unspecified limb: Secondary | ICD-10-CM | POA: Diagnosis not present

## 2014-03-25 DIAGNOSIS — M6281 Muscle weakness (generalized): Secondary | ICD-10-CM | POA: Diagnosis not present

## 2014-03-27 DIAGNOSIS — R601 Generalized edema: Secondary | ICD-10-CM | POA: Diagnosis not present

## 2014-03-27 DIAGNOSIS — M79609 Pain in unspecified limb: Secondary | ICD-10-CM | POA: Diagnosis not present

## 2014-03-27 DIAGNOSIS — M25511 Pain in right shoulder: Secondary | ICD-10-CM | POA: Diagnosis not present

## 2014-03-27 DIAGNOSIS — M6281 Muscle weakness (generalized): Secondary | ICD-10-CM | POA: Diagnosis not present

## 2014-03-27 DIAGNOSIS — M79651 Pain in right thigh: Secondary | ICD-10-CM | POA: Diagnosis not present

## 2014-03-30 DIAGNOSIS — M6281 Muscle weakness (generalized): Secondary | ICD-10-CM | POA: Diagnosis not present

## 2014-03-30 DIAGNOSIS — M25511 Pain in right shoulder: Secondary | ICD-10-CM | POA: Diagnosis not present

## 2014-03-30 DIAGNOSIS — R601 Generalized edema: Secondary | ICD-10-CM | POA: Diagnosis not present

## 2014-03-30 DIAGNOSIS — M79609 Pain in unspecified limb: Secondary | ICD-10-CM | POA: Diagnosis not present

## 2014-03-30 DIAGNOSIS — M79651 Pain in right thigh: Secondary | ICD-10-CM | POA: Diagnosis not present

## 2014-04-02 DIAGNOSIS — M6281 Muscle weakness (generalized): Secondary | ICD-10-CM | POA: Diagnosis not present

## 2014-04-02 DIAGNOSIS — M25511 Pain in right shoulder: Secondary | ICD-10-CM | POA: Diagnosis not present

## 2014-04-02 DIAGNOSIS — M79651 Pain in right thigh: Secondary | ICD-10-CM | POA: Diagnosis not present

## 2014-04-02 DIAGNOSIS — R601 Generalized edema: Secondary | ICD-10-CM | POA: Diagnosis not present

## 2014-04-02 DIAGNOSIS — M79609 Pain in unspecified limb: Secondary | ICD-10-CM | POA: Diagnosis not present

## 2014-04-03 DIAGNOSIS — M79651 Pain in right thigh: Secondary | ICD-10-CM | POA: Diagnosis not present

## 2014-04-03 DIAGNOSIS — M25511 Pain in right shoulder: Secondary | ICD-10-CM | POA: Diagnosis not present

## 2014-04-03 DIAGNOSIS — R601 Generalized edema: Secondary | ICD-10-CM | POA: Diagnosis not present

## 2014-04-03 DIAGNOSIS — M79609 Pain in unspecified limb: Secondary | ICD-10-CM | POA: Diagnosis not present

## 2014-04-03 DIAGNOSIS — M6281 Muscle weakness (generalized): Secondary | ICD-10-CM | POA: Diagnosis not present

## 2014-04-05 DIAGNOSIS — Z23 Encounter for immunization: Secondary | ICD-10-CM | POA: Diagnosis not present

## 2014-04-06 DIAGNOSIS — R601 Generalized edema: Secondary | ICD-10-CM | POA: Diagnosis not present

## 2014-04-06 DIAGNOSIS — M79651 Pain in right thigh: Secondary | ICD-10-CM | POA: Diagnosis not present

## 2014-04-06 DIAGNOSIS — M79609 Pain in unspecified limb: Secondary | ICD-10-CM | POA: Diagnosis not present

## 2014-04-06 DIAGNOSIS — M6281 Muscle weakness (generalized): Secondary | ICD-10-CM | POA: Diagnosis not present

## 2014-04-06 DIAGNOSIS — M25511 Pain in right shoulder: Secondary | ICD-10-CM | POA: Diagnosis not present

## 2014-04-08 DIAGNOSIS — M79609 Pain in unspecified limb: Secondary | ICD-10-CM | POA: Diagnosis not present

## 2014-04-08 DIAGNOSIS — R601 Generalized edema: Secondary | ICD-10-CM | POA: Diagnosis not present

## 2014-04-08 DIAGNOSIS — M25511 Pain in right shoulder: Secondary | ICD-10-CM | POA: Diagnosis not present

## 2014-04-08 DIAGNOSIS — M6281 Muscle weakness (generalized): Secondary | ICD-10-CM | POA: Diagnosis not present

## 2014-04-08 DIAGNOSIS — M79651 Pain in right thigh: Secondary | ICD-10-CM | POA: Diagnosis not present

## 2014-04-17 DIAGNOSIS — M79651 Pain in right thigh: Secondary | ICD-10-CM | POA: Diagnosis not present

## 2014-04-17 DIAGNOSIS — R601 Generalized edema: Secondary | ICD-10-CM | POA: Diagnosis not present

## 2014-04-17 DIAGNOSIS — M6281 Muscle weakness (generalized): Secondary | ICD-10-CM | POA: Diagnosis not present

## 2014-04-17 DIAGNOSIS — M25511 Pain in right shoulder: Secondary | ICD-10-CM | POA: Diagnosis not present

## 2014-04-17 DIAGNOSIS — M79609 Pain in unspecified limb: Secondary | ICD-10-CM | POA: Diagnosis not present

## 2014-04-20 DIAGNOSIS — R601 Generalized edema: Secondary | ICD-10-CM | POA: Diagnosis not present

## 2014-04-20 DIAGNOSIS — M79651 Pain in right thigh: Secondary | ICD-10-CM | POA: Diagnosis not present

## 2014-04-20 DIAGNOSIS — M79609 Pain in unspecified limb: Secondary | ICD-10-CM | POA: Diagnosis not present

## 2014-04-22 DIAGNOSIS — R601 Generalized edema: Secondary | ICD-10-CM | POA: Diagnosis not present

## 2014-04-22 DIAGNOSIS — M79651 Pain in right thigh: Secondary | ICD-10-CM | POA: Diagnosis not present

## 2014-04-22 DIAGNOSIS — M79609 Pain in unspecified limb: Secondary | ICD-10-CM | POA: Diagnosis not present

## 2014-04-26 ENCOUNTER — Other Ambulatory Visit: Payer: Self-pay | Admitting: Internal Medicine

## 2014-04-29 ENCOUNTER — Other Ambulatory Visit: Payer: Self-pay | Admitting: Internal Medicine

## 2014-05-20 DIAGNOSIS — Z419 Encounter for procedure for purposes other than remedying health state, unspecified: Secondary | ICD-10-CM | POA: Diagnosis not present

## 2014-05-20 DIAGNOSIS — L304 Erythema intertrigo: Secondary | ICD-10-CM | POA: Diagnosis not present

## 2014-05-20 DIAGNOSIS — D485 Neoplasm of uncertain behavior of skin: Secondary | ICD-10-CM | POA: Diagnosis not present

## 2014-05-29 ENCOUNTER — Ambulatory Visit (INDEPENDENT_AMBULATORY_CARE_PROVIDER_SITE_OTHER): Payer: Medicare Other | Admitting: Family Medicine

## 2014-05-29 ENCOUNTER — Ambulatory Visit: Payer: Self-pay | Admitting: Family Medicine

## 2014-05-29 ENCOUNTER — Encounter: Payer: Self-pay | Admitting: Family Medicine

## 2014-05-29 VITALS — BP 122/82 | HR 86 | Temp 97.4°F | Wt 310.0 lb

## 2014-05-29 DIAGNOSIS — R609 Edema, unspecified: Secondary | ICD-10-CM | POA: Diagnosis not present

## 2014-05-29 NOTE — Progress Notes (Signed)
Garret Reddish, MD Phone: [phone]  Subjective:  Patient presents today to establish care with me as their new primary care provider. Patient was formerly a patient of Dr. Asa Lente. Chief complaint-noted.   Morbid Obesity-poor control Edema-mild worsening Some interplay between these. Patient without known CHF and with echocardiogram within a year. Despite this, when she does not take lasix, she seems to have some fluid retention. She has gained 15 lbs since last visit not taking lasix. Does not take because it messes up her active schedule at [facility]. She states she has a hard time eating well at [facility] as there is a lot of fried food. She has the ability to cook but is tired of cooking even though she knows she cannot eat better. She does [facility] some N[facility]ep at [facility] and does not get shortness of breath with this activity. She has nto been able to fit any TED hose or compression stokings even from medical supply store.  ROS- Denies shortness of breath with normal activity-she is limited by pain so hard to tell what her exercise tolerance is.   The following were reviewed and entered/updated in epic: Past Medical History  Diagnosis Date  . Morbid obesity   . MIGRAINE HEADACHE   . ARTHRITIS, KNEES, BILATERAL   . DEPRESSION   . GERD   . HYPERLIPIDEMIA   . HYPERTENSION   . Breast CA 2000    s/p R lumpectomy and XRT  . DJD (degenerative joint disease) of knee   . Urge incontinence   . HYPOTHYROIDISM     postsurgical  . OSA (obstructive sleep apnea) 01/17/2011 dx   Patient Active Problem List   Diagnosis Date Noted  . Morbid obesity 10/04/2009    Priority: High  . Edema 05/29/2014    Priority: Medium  . History of breast cancer 05/29/2014    Priority: Medium  . DOE (dyspnea on exertion) 12/05/2013    Priority: Medium  . Sleep-related hypoventilation 05/27/2013    Priority: Medium  . Panic attacks 03/19/2013    Priority: Medium  . OSA (obstructive sleep apnea)  01/17/2011    Priority: Medium  . Depression 10/04/2009    Priority: Medium  . Hypothyroidism 08/05/2009    Priority: Medium  . Hyperlipidemia 08/05/2009    Priority: Medium  . Essential hypertension 08/05/2009    Priority: Medium  . Allergic rhinitis 05/29/2014    Priority: Low  . Migraine 10/04/2009    Priority: Low  . Osteoarthritis 10/04/2009    Priority: Low  . GERD 08/05/2009    Priority: Low   Past Surgical History  Procedure Laterality Date  . Cholecystectomy  1995  . Breast lumpectomy  2000  . Thyroidectomy    . Breast biopsy  2000  . Replacement total knee bilateral  1999, 2005  . Tubal ligation    . Parathyroidectomy      2-3 removed  . Tonsillectomy    . Adenoidectomy    . Right leg femur repaired      Family History  Problem Relation Age of Onset  . Emphysema Sister   . Coronary artery disease Neg Hx     Medications- reviewed and updated Current Outpatient Prescriptions  Medication Sig Dispense Refill  . aspirin 81 MG tablet Take 81 mg by mouth every morning.     [name] FIBER PO Take 2 tablets by mouth every morning.     . furosemide (LASIX) 40 MG tablet Take 1 tablet (40 mg total) by mouth daily.  90 tablet 1  . levothyroxine (SYNTHROID, LEVOTHROID) 175 MCG tablet Take 1 tablet (175 mcg total) by mouth daily before breakfast. 90 tablet 3  . loratadine (CLARITIN) 10 MG tablet Take 10 mg by mouth daily as needed for allergies.     . Omega-3 Fatty Acids (FISH OIL) 1200 MG CAPS Take 2 capsules by mouth every morning.     [name] omeprazole (PRILOSEC) 20 MG capsule Take 1 capsule (20 mg total) by mouth 2 (two) times daily. 180 capsule 3  . potassium chloride (MICRO-K) 10 MEQ CR capsule TAKE 2 CAPSULE BY MOUTH ONCE DAILY 60 capsule 3  . TRIBENZOR 20-5-12.5 MG TABS TAKE 1 TABLET BY MOUTH EVERY DAY 30 tablet 5  . ALPRAZolam (XANAX) 0.25 MG tablet TAKE 1 TABLET BY MOUTH TWICE A DAY AS NEEDED FOR ANXIETY OR SLEEP (Patient not taking: Reported on 05/29/2014) 40 tablet 5  .  nystatin (MYCOSTATIN/NYSTOP) 100000 UNIT/GM POWD APPLY TO AFFECTED AREA TWICE A DAY (Patient not taking: Reported on 05/29/2014) 120 g 0  . phenazopyridine (AZO-TABS) 95 MG tablet Take 95 mg by mouth daily as needed (for urinary pain).     [name] senna-docusate (SENOKOT-S) 8.6-50 MG per tablet Take 2 tablets by mouth at bedtime as needed for mild constipation. (Patient not taking: Reported on 05/29/2014) 30 tablet 1   No current facility-administered medications for this visit.    Allergies-reviewed and updated Allergies  Allergen Reactions  . Azithromycin Itching  . Ciprofloxacin Other (See Comments)    REACTION: edgy and very jumpy   . Codeine Nausea And Vomiting  . Epinephrine Other (See Comments)    REACTION: rapid pulse, sweats  . Klonopin [Clonazepam] Other (See Comments)    Makes her feel very suicidal   . Oxycodone Other (See Comments)    Patient felt like it altered her mental status "Crazy" . Hallucinations later as well.  [name] Paxil [Paroxetine Hydrochloride] Other (See Comments)    Sever depression   . Propoxyphene Hcl Nausea And Vomiting  . Meloxicam Diarrhea    History   Social History  . Marital Status: Married    Spouse Name: N/A    Number of Children: N/A  . Years of Education: N/A   Occupational History  . RETIRED     worked on office   Social History Main Topics  . Smoking status: Former Smoker -- 0.25 packs/day for 2 years    Types: Cigarettes    Quit date: 06/19/1974  . Smokeless tobacco: Never Used     Comment: Quit in late 20's  . Alcohol Use: Yes     Comment: rare  . Drug Use: No  . Sexual Activity: No   Other Topics Concern  . None   Social History Narrative   Married to husband that has dementia and this is a Runner, broadcasting/film/video. 5 children. 10 grandchildren. 2 greatgrandchildren. All children [place]      Lives at [facility]. Stays busy there.       Retired from multiple different Community education officer, university, Psychologist, educational.       Hobbies: local  politics, write poetry, craft dolls    ROS--See HPI   Objective: BP 122/82 mmHg  Pulse 86  Temp(Src) 97.4 F (36.3 C)  Wt 310 lb (140.615 kg) Gen: NAD, resting comfortably, morbidly obese CV: RRR no murmurs rubs or gallops Lungs: CTAB, faint crackles at both lung bases Abdomen: soft/nontender/nondistended/normal bowel sounds.  Ext: 2+ pitting edema Skin: warm, dry, venous stasis changes on legs Neuro:  grossly normal, moves all extremities   Assessment/Plan:  Morbid obesity Discussed with patient that 15 lbs weight gain could be fluid retention related. She does not tolerate compression stockings and cannot get them on despite multiple attempts in the past.   Her weight otherwise is affected by her poor eating habits. She does not want to cook but knows she needs to. She is exercising but minimally. She is interested in meeting with a nutritionist. I counseled patient extensively about things that she can do for this.   Edema Likely venous insufficiency. Discussed with patient that 15 lbs weight gain could be fluid retention related. She does not tolerate compression stockings and cannot get them on despite multiple attempts in the past. She has not done well with her lasix due to affecting her social schedule. We discussed trying to take every 2-3 days at least at this point .She does have faint crackles and despite having largely normal echo a year ago, I discussed with patient repeating this at follow up if she does not have lower weight on q2-3 day lasix and meeting with nutrition and improving habits.   >50% of 30 minute office visit was spent on counseling (obesity, importance of medication compliance) and coordination of care  Return precautions advised. Planned 4-8 week follow up if no worsening of symptoms.

## 2014-05-29 NOTE — Patient Instructions (Addendum)
Try one of the following nutritionist:  Zanyla Klebba [phone] [facility] [address] [place] [address] [place] [phone] Family nutrition.   Consider stopping fiber  Take lasix $RemoveBe'40mg'[id number]$  every 2nd or 3rd day at least. You have the sound of slight fluid on your lungs and I want to see if this improves with the lasix. Also weight up 15 lbs likely fluid related.   Consider repeat echocardiogram next visit  Follow up 4-8 weeks in morning and come in fasting and we can consider blood work at that time.

## 2014-05-31 NOTE — Assessment & Plan Note (Signed)
Likely venous insufficiency. Discussed with patient that 15 lbs weight gain could be fluid retention related. She does not tolerate compression stockings and cannot get them on despite multiple attempts in the past. She has not done well with her lasix due to affecting her social schedule. We discussed trying to take every 2-3 days at least at this point .She does have faint crackles and despite having largely normal echo a year ago, I discussed with patient repeating this at follow up if she does not have lower weight on q2-3 day lasix and meeting with nutrition and improving habits.

## 2014-05-31 NOTE — Assessment & Plan Note (Signed)
Discussed with patient that 15 lbs weight gain could be fluid retention related. She does not tolerate compression stockings and cannot get them on despite multiple attempts in the past.   Her weight otherwise is affected by her poor eating habits. She does not want to cook but knows she needs to. She is exercising but minimally. She is interested in meeting with a nutritionist. I counseled patient extensively about things that she can do for this.

## 2014-06-24 ENCOUNTER — Encounter: Payer: Self-pay | Admitting: Family Medicine

## 2014-06-25 ENCOUNTER — Other Ambulatory Visit: Payer: Self-pay

## 2014-06-25 MED ORDER — NYSTATIN 100000 UNIT/GM EX POWD: CUTANEOUS | Status: DC

## 2014-06-25 NOTE — Telephone Encounter (Signed)
Yes may fill 

## 2014-07-09 ENCOUNTER — Encounter: Payer: Self-pay | Admitting: Family Medicine

## 2014-07-09 ENCOUNTER — Ambulatory Visit: Payer: Self-pay | Admitting: Family Medicine

## 2014-07-09 ENCOUNTER — Ambulatory Visit (INDEPENDENT_AMBULATORY_CARE_PROVIDER_SITE_OTHER): Payer: Medicare Other | Admitting: Family Medicine

## 2014-07-09 VITALS — BP 118/80 | HR 75 | Temp 97.2°F | Ht 66.0 in | Wt 306.0 lb

## 2014-07-09 DIAGNOSIS — R600 Localized edema: Secondary | ICD-10-CM | POA: Diagnosis not present

## 2014-07-09 DIAGNOSIS — I10 Essential (primary) hypertension: Secondary | ICD-10-CM | POA: Diagnosis not present

## 2014-07-09 NOTE — Patient Instructions (Addendum)
Elevate legs for 30 minute above the waist twice daily  Take 40 mg of lasix daily for 3 days then return to your usual dose  Hydrocortisone cream twice daily  Antibacterial ointment twice daily for 5 days  Follow up with Dr. Yong Channel in 2-4 weeks  Avoid salt in your diet  Compression during the day, remove at night

## 2014-07-09 NOTE — Progress Notes (Signed)
Pre visit review using our clinic review tool, if applicable. No additional management support is needed unless otherwise documented below in the visit note. 

## 2014-07-09 NOTE — Progress Notes (Signed)
HPI:  Tamara Morales is an 81 yo patient of Dr. Yong Channel here for an acute visit for:  1) Rash: -reports chronic venous insufficiency/LE edema -reports PCP told her to get compression stockings, but she does not like the way they feel - reports therapist can put them on but she removes them -now with some mild erythema in both LE and itching of skin -she has been putting vic vapor rub on her legs -she has seen PCP about this, reports she does not like taking her lasix $RemoveB'40mg'[id number]$  because pees too much -not elevating -denies CP, SOB, orthopnea  ROS: See pertinent positives and negatives per HPI.  Past Medical History  Diagnosis Date  . Morbid obesity   . MIGRAINE HEADACHE   . ARTHRITIS, KNEES, BILATERAL   . DEPRESSION   . GERD   . HYPERLIPIDEMIA   . HYPERTENSION   . Breast CA 2000    s/p R lumpectomy and XRT  . DJD (degenerative joint disease) of knee   . Urge incontinence   . HYPOTHYROIDISM     postsurgical  . OSA (obstructive sleep apnea) 01/17/2011 dx    Past Surgical History  Procedure Laterality Date  . Cholecystectomy  1995  . Breast lumpectomy  2000  . Thyroidectomy    . Breast biopsy  2000  . Replacement total knee bilateral  1999, 2005  . Tubal ligation    . Parathyroidectomy      2-3 removed  . Tonsillectomy    . Adenoidectomy    . Right leg femur repaired      Family History  Problem Relation Age of Onset  . Emphysema Sister   . Coronary artery disease Neg Hx     History   Social History  . Marital Status: Married    Spouse Name: N/A    Number of Children: N/A  . Years of Education: N/A   Occupational History  . RETIRED     worked on office   Social History Main Topics  . Smoking status: Former Smoker -- 0.25 packs/day for 2 years    Types: Cigarettes    Quit date: 06/19/1974  . Smokeless tobacco: Never Used     Comment: Quit in late 20's  . Alcohol Use: Yes     Comment: rare  . Drug Use: No  . Sexual Activity: No   Other Topics  Concern  . None   Social History Narrative   Married to husband that has dementia and this is a Runner, broadcasting/film/video. 5 children. 10 grandchildren. 2 greatgrandchildren. All children [place]      Lives at [facility]. Stays busy there.       Retired from multiple different Community education officer, university, Psychologist, educational.       Hobbies: local politics, write poetry, craft dolls     Current outpatient prescriptions:  .  ALPRAZolam (XANAX) 0.25 MG tablet, TAKE 1 TABLET BY MOUTH TWICE A DAY AS NEEDED FOR ANXIETY OR SLEEP, Disp: 40 tablet, Rfl: 5 .  aspirin 81 MG tablet, Take 81 mg by mouth every morning. , Disp: , Rfl:  .  Esomeprazole Magnesium (NEXIUM PO), Take by mouth daily., Disp: , Rfl:  .  FIBER PO, Take 2 tablets by mouth every morning. , Disp: , Rfl:  .  furosemide (LASIX) 40 MG tablet, Take 1 tablet (40 mg total) by mouth daily. (Patient taking differently: Take 20 mg by mouth daily. ), Disp: 90 tablet, Rfl: 1 .  levothyroxine (SYNTHROID, LEVOTHROID) 175 MCG  tablet, Take 1 tablet (175 mcg total) by mouth daily before breakfast., Disp: 90 tablet, Rfl: 3 .  loratadine (CLARITIN) 10 MG tablet, Take 10 mg by mouth daily as needed for allergies. , Disp: , Rfl:  .  nystatin (MYCOSTATIN/NYSTOP) 100000 UNIT/GM POWD, APPLY TO AFFECTED AREA TWICE A DAY, Disp: 120 g, Rfl: 0 .  Omega-3 Fatty Acids (FISH OIL) 1200 MG CAPS, Take 2 capsules by mouth every morning. , Disp: , Rfl:  .  phenazopyridine (AZO-TABS) 95 MG tablet, Take 95 mg by mouth daily as needed (for urinary pain). , Disp: , Rfl:  .  potassium chloride (MICRO-K) 10 MEQ CR capsule, TAKE 2 CAPSULE BY MOUTH ONCE DAILY, Disp: 60 capsule, Rfl: 3 .  senna-docusate (SENOKOT-S) 8.6-50 MG per tablet, Take 2 tablets by mouth at bedtime as needed for mild constipation., Disp: 30 tablet, Rfl: 1 .  TRIBENZOR 20-5-12.5 MG TABS, TAKE 1 TABLET BY MOUTH EVERY DAY, Disp: 30 tablet, Rfl: 5  EXAM:  Filed Vitals:   07/09/14 1111  BP: 118/80  Pulse: 75  Temp: 97.2  F (36.2 C)    Body mass index is 49.41 kg/(m^2).  GENERAL: vitals reviewed and listed above, alert, oriented, appears well hydrated and in no acute distress  HEENT: atraumatic, conjunttiva clear, no obvious abnormalities on inspection of external nose and ears  NECK: no obvious masses on inspection  LUNGS: clear to auscultation bilaterally, no wheezes, rales or rhonchi, good air movement  CV: HRRR, bilat pitting edema to lower thighs, mild erythema bilat with chronic venous stasis dermatitis  MS: moves all extremities without noticeable abnormality  PSYCH: pleasant and cooperative, no obvious depression or anxiety  ASSESSMENT AND PLAN:  Discussed the following assessment and plan:  Bilateral leg edema  -sig LE edema, chronic, likely multifactorial -I do not see signs of infection today, and she does not have any CP or breathing issues - PCP not query CHF and there are plans to repeat an echo -she unfortunately has not done any of the recommendations from her PCP as caused discomfort -convinced her to do short term trial of increased lasix, elevation, compression - discussed and showed her how to apply compression and order given for her therapist to assist - advised doing the 3 days of increased lasix first with elevation then gradually increasing the time she wears the compression - starting compression in the morning instead of at night -hc cream for itch if needed, abx ointment  -close follow up -Patient advised to return or notify a doctor immediately if symptoms worsen or persist or new concerns arise.  Patient Instructions  Elevate legs for 30 minute above the waist twice daily  Take 40 mg of lasix daily for 3 days then return to your usual dose  Hydrocortisone cream twice daily  Antibacterial ointment twice daily for 5 days  Follow up with Dr. Yong Channel in 2-4 weeks  Avoid salt in your diet  Compression during the day, remove at night     Jamesyn Moorefield R.

## 2014-07-17 ENCOUNTER — Ambulatory Visit: Payer: Self-pay | Admitting: Family Medicine

## 2014-07-29 ENCOUNTER — Ambulatory Visit (INDEPENDENT_AMBULATORY_CARE_PROVIDER_SITE_OTHER): Payer: Medicare Other | Admitting: Family Medicine

## 2014-07-29 ENCOUNTER — Encounter: Payer: Self-pay | Admitting: Family Medicine

## 2014-07-29 VITALS — BP 120/82 | Temp 97.9°F | Wt 306.0 lb

## 2014-07-29 DIAGNOSIS — R739 Hyperglycemia, unspecified: Secondary | ICD-10-CM

## 2014-07-29 DIAGNOSIS — R609 Edema, unspecified: Secondary | ICD-10-CM | POA: Diagnosis not present

## 2014-07-29 DIAGNOSIS — I10 Essential (primary) hypertension: Secondary | ICD-10-CM

## 2014-07-29 DIAGNOSIS — E785 Hyperlipidemia, unspecified: Secondary | ICD-10-CM | POA: Diagnosis not present

## 2014-07-29 DIAGNOSIS — E890 Postprocedural hypothyroidism: Secondary | ICD-10-CM | POA: Diagnosis not present

## 2014-07-29 LAB — COMPREHENSIVE METABOLIC PANEL
ALT: 15 U/L (ref 0–35)
AST: 17 U/L (ref 0–37)
Albumin: 3.8 g/dL (ref 3.5–5.2)
Alkaline Phosphatase: 99 U/L (ref 39–117)
BUN: 20 mg/dL (ref 6–23)
CALCIUM: 8.8 mg/dL (ref 8.4–10.5)
CHLORIDE: 101 meq/L (ref 96–112)
CO2: 33 meq/L — AB (ref 19–32)
Creatinine, Ser: 0.82 mg/dL (ref 0.40–1.20)
GFR: 70.96 mL/min (ref >60.00)
Glucose, Bld: 100 mg/dL — ABNORMAL HIGH (ref 70–99)
Potassium: 3.9 mEq/L (ref 3.5–5.1)
Sodium: 141 mEq/L (ref 135–145)
Total Bilirubin: 0.5 mg/dL (ref 0.2–1.2)
Total Protein: 6.9 g/dL (ref 6.0–8.3)

## 2014-07-29 LAB — LIPID PANEL
CHOLESTEROL: 172 mg/dL (ref 0–200)
HDL: 42.3 mg/dL (ref >39.00)
LDL Cholesterol: 98 mg/dL (ref 0–99)
NONHDL: 129.7
Total CHOL/HDL Ratio: 4
Triglycerides: 159 mg/dL — ABNORMAL HIGH (ref 0.0–149.0)
VLDL: 31.8 mg/dL (ref 0.0–40.0)

## 2014-07-29 LAB — HEMOGLOBIN A1C: HEMOGLOBIN A1C: 6.2 % (ref 4.6–6.5)

## 2014-07-29 LAB — TSH: TSH: 4.61 u[IU]/mL — AB (ref 0.35–4.50)

## 2014-07-29 NOTE — Patient Instructions (Addendum)
I agree with your idea to try weight watchers, sorry the nutritionist option did not help. Hopeful you are able to have a meaningful discussion with nutritionist at [facility].   Continue lasix daily. Check potassium today  Check thyroid, cholesterol, kidney function  See you in 4 months. Blood pressure looks good.

## 2014-07-29 NOTE — Assessment & Plan Note (Signed)
Strongly suspect venous stasis especially with venous stasis dermatitis changes on exam. I have advised patient of need to lose weight to hopefully get to point where we can finally apply compression stockings. For now, continue elevation and daily lasix-options are extremely limited.

## 2014-07-29 NOTE — Assessment & Plan Note (Signed)
Strongly advised weight loss. She is going to try weight watchers and discuss poor eating choices with lead nutritionist at [facility].

## 2014-07-29 NOTE — Progress Notes (Signed)
Garret Reddish, MD Phone: [phone]  Subjective:   Tamara Morales is a 81 y.o. year old very pleasant female patient who presents with the following:  Edema Morbid Obesity -lasix daily, weight trended 304 down to 297 or 298 then back up to 302. In office weights are down 4 lbs total from visit 05/29/14.   Patient continues to be able to get her compression stockings on even with director of nursing at [facility] trying to assist. She has elevated her legs which helps some. She is taking the lasix daily despite it messing with some of her social plans.   Her weight is down some but she continues to eat poorly and states no good options at [facility]. She eats better when she cooks but they moved there so she would not have to cook. She tried to go to a nutritionist but there were 12 steps and she could not climb them.   ROS- no chest pain or shortness of breath  Past Medical History- Patient Active Problem List   Diagnosis Date Noted  . Morbid obesity 10/04/2009    Priority: High  . Edema 05/29/2014    Priority: Medium  . History of breast cancer 05/29/2014    Priority: Medium  . DOE (dyspnea on exertion) 12/05/2013    Priority: Medium  . Sleep-related hypoventilation 05/27/2013    Priority: Medium  . Panic attacks 03/19/2013    Priority: Medium  . OSA (obstructive sleep apnea) 01/17/2011    Priority: Medium  . Depression 10/04/2009    Priority: Medium  . Hypothyroidism 08/05/2009    Priority: Medium  . Hyperlipidemia 08/05/2009    Priority: Medium  . Essential hypertension 08/05/2009    Priority: Medium  . Allergic rhinitis 05/29/2014    Priority: Low  . Migraine 10/04/2009    Priority: Low  . Osteoarthritis 10/04/2009    Priority: Low  . GERD 08/05/2009    Priority: Low   Medications- reviewed and updated Current Outpatient Prescriptions  Medication Sig Dispense Refill  . aspirin 81 MG tablet Take 81 mg by mouth every morning.     . Esomeprazole  Magnesium (NEXIUM PO) Take by mouth daily.    [name] FIBER PO Take 2 tablets by mouth every morning.     . furosemide (LASIX) 40 MG tablet Take 1 tablet (40 mg total) by mouth daily. (Patient taking differently: Take 20 mg by mouth daily. ) 90 tablet 1  . levothyroxine (SYNTHROID, LEVOTHROID) 175 MCG tablet Take 1 tablet (175 mcg total) by mouth daily before breakfast. 90 tablet 3  . loratadine (CLARITIN) 10 MG tablet Take 10 mg by mouth daily as needed for allergies.     [name] nystatin (MYCOSTATIN/NYSTOP) 100000 UNIT/GM POWD APPLY TO AFFECTED AREA TWICE A DAY 120 g 0  . Omega-3 Fatty Acids (FISH OIL) 1200 MG CAPS Take 2 capsules by mouth every morning.     . phenazopyridine (AZO-TABS) 95 MG tablet Take 95 mg by mouth daily as needed (for urinary pain).     . potassium chloride (MICRO-K) 10 MEQ CR capsule TAKE 2 CAPSULE BY MOUTH ONCE DAILY 60 capsule 3  . senna-docusate (SENOKOT-S) 8.6-50 MG per tablet Take 2 tablets by mouth at bedtime as needed for mild constipation. 30 tablet 1  . TRIBENZOR 20-5-12.5 MG TABS TAKE 1 TABLET BY MOUTH EVERY DAY 30 tablet 5  . ALPRAZolam (XANAX) 0.25 MG tablet TAKE 1 TABLET BY MOUTH TWICE A DAY AS NEEDED FOR ANXIETY OR SLEEP (Patient not taking:  Reported on 07/29/2014) 40 tablet 5   No current facility-administered medications for this visit.    Objective: BP 120/82 mmHg  Temp(Src) 97.9 F (36.6 C)  Wt 306 lb (138.801 kg) Gen: NAD, resting comfortably CV: RRR no murmurs rubs or gallops Lungs: CTAB no crackles, wheeze, rhonchi (no longer with crackles heard 2 visits ago) Abdomen: soft/nontender/nondistended/normal bowel sounds. Morbidly obese.   Ext: 2+ pitting edema with venous stasis changes Skin: warm, dry with scaling and mild erythema on legs   Assessment/Plan:  Morbid obesity Strongly advised weight loss. She is going to try weight watchers and discuss poor eating choices with lead [facility]ist at [facility].    Edema Strongly suspect venous stasis  especially with venous stasis dermatitis changes on exam. I have advised patient of need to lose weight to hopefully get to point where we can finally apply compression stockings. For now, continue elevation and daily lasix-options are extremely limited.     Return precautions advised. 4 month follow up or prn. Check TSH at follow up given slight elevation at 4.6. If continues to be elevated increase levothyroxine. Ensure patient taking properly.   Results for orders placed or performed in visit on 07/29/14 (from the past 24 hour(s))  Comprehensive metabolic panel     Status: Abnormal   Collection Time: 07/29/14 10:00 AM  Result Value Ref Range   Sodium 141 135 - 145 mEq/L   Potassium 3.9 3.5 - 5.1 mEq/L   Chloride 101 96 - 112 mEq/L   CO2 33 (H) 19 - 32 mEq/L   Glucose, Bld 100 (H) 70 - 99 mg/dL   BUN 20 6 - 23 mg/dL   Creatinine, Ser 0.82 0.40 - 1.20 mg/dL   Total Bilirubin 0.5 0.2 - 1.2 mg/dL   Alkaline Phosphatase 99 39 - 117 U/L   AST 17 0 - 37 U/L   ALT 15 0 - 35 U/L   Total Protein 6.9 6.0 - 8.3 g/dL   Albumin 3.8 3.5 - 5.2 g/dL   Calcium 8.8 8.4 - 10.5 mg/dL   GFR 70.96 >60.00 mL/min  Hemoglobin A1c     Status: None   Collection Time: 07/29/14 10:00 AM  Result Value Ref Range   Hgb A1c MFr Bld 6.2 4.6 - 6.5 %  TSH     Status: Abnormal   Collection Time: 07/29/14 10:00 AM  Result Value Ref Range   TSH 4.61 (H) 0.35 - 4.50 uIU/mL  Lipid panel     Status: Abnormal   Collection Time: 07/29/14 10:00 AM  Result Value Ref Range   Cholesterol 172 0 - 200 mg/dL   Triglycerides 159.0 (H) 0.0 - 149.0 mg/dL   HDL 42.30 >39.00 mg/dL   VLDL 31.8 0.0 - 40.0 mg/dL   LDL Cholesterol 98 0 - 99 mg/dL   Total CHOL/HDL Ratio 4    NonHDL 129.70

## 2014-07-31 ENCOUNTER — Encounter: Payer: Self-pay | Admitting: Family Medicine

## 2014-08-19 ENCOUNTER — Ambulatory Visit (INDEPENDENT_AMBULATORY_CARE_PROVIDER_SITE_OTHER): Payer: Medicare Other | Admitting: Pulmonary Disease

## 2014-08-19 ENCOUNTER — Encounter: Payer: Self-pay | Admitting: Pulmonary Disease

## 2014-08-19 VITALS — BP 126/80 | HR 92 | Temp 97.6°F | Ht 64.0 in | Wt 308.0 lb

## 2014-08-19 DIAGNOSIS — G4733 Obstructive sleep apnea (adult) (pediatric): Secondary | ICD-10-CM | POA: Diagnosis not present

## 2014-08-19 NOTE — Patient Instructions (Signed)
Will arrange for pressure change on CPAP machine Call once you find a CPAP mask you want to change to Follow up in 2 months

## 2014-08-19 NOTE — Progress Notes (Signed)
Chief Complaint  Patient presents with  . Follow-up    pt wearing cpap 2-6 hours nightly, states her mask loses its seal to her face as the night progresses.      History of Present Illness: Tamara Morales is a 81 y.o. female with severe OSA using CPAP and 2 liters oxygen.  She uses CPAP for up to 6 hours per night.  She also uses oxygen at night.  Her main issue is related to mask seal.  She feels she sleeps better in a recliner, especially on the nights she can't use CPAP.  She has leg swelling.    She is having difficulty controlling her weight.  She is not able to regulate her diet appropriately >> she says there is a very limit menu option at her assisted living center, and everything is fried.  TESTS: PSG 01/22/11 >> AHI 74.3, SpO2 low 76%. ONO with RA and CPAP 04/21/11 >> Test time 5 hr 12 min. Basal SpO2 87%, low SpO2 73%. Spent 161 min with SpO2 < 88%. Echo 04/01/13 >> EF 55 to 60%, PAS 43 mmHg CPAP 05/11/12 to 08/08/12 >> used on 89 of 90 nights with average 4 hrs 38 min.  Average AHI 6.7 with CPAP 10 cm H2O. CPAP 10/11/12 to 01/08/13 >> Used on 88 of 90 nights with average 5 hrs 15 min. Average AHI 5.3 with CPAP 11 cm H2O.  PMHx >> HA, OA, Depression GERD, HLD, HTN, Breast cancer in 2000, Hypothyroidism  PSHx, Medications, Allergies, Fhx, Shx reviewed.   Physical Exam: Blood pressure 126/80, pulse 92, temperature 97.6 F (36.4 C), temperature source Oral, height $RemoveBefo'5\' 4"'lEBALSCKiao$  (1.626 m), weight 308 lb (139.708 kg), SpO2 93 %. Body mass index is 52.84 kg/(m^2).  General - No distress ENT - No sinus tenderness, no oral exudate, no LAN Cardiac - s1s2 regular, no murmur Chest - No wheeze/rales/dullness Back - No focal tenderness Abd - Soft, non-tender Ext - 1+ edema Neuro - Normal strength Skin - No rashes Psych - normal mood, and behavior   Assessment/Plan:  Obstructive sleep apnea. She reports compliance with CPAP, but has difficulty tolerating CPAP consistently >> might  be related to inadequate pressure settings and issues with mask fit. Plan: - will change her to auto CPAP with range 5 to 15 cm H2O, and get download from her machine - advised her to d/w her DME about mask fit - also advised her to review CPAP mask manufacture websites to determine if there is a better mask option for her >> she can call for order if she finds different mask  Obesity hypoventilation syndrome. Plan: - she is to continue 2 liter oxygen at night with CPAP  Morbid obesity. Plan: - discussed options for assisting with weight loss   Chesley Mires, MD [facility] Pager:  [phone] 08/19/2014, 3:45 PM

## 2014-09-09 ENCOUNTER — Other Ambulatory Visit: Payer: Self-pay | Admitting: Internal Medicine

## 2014-09-27 ENCOUNTER — Encounter: Payer: Self-pay | Admitting: Family Medicine

## 2014-09-28 ENCOUNTER — Other Ambulatory Visit: Payer: Self-pay

## 2014-09-28 MED ORDER — POTASSIUM CHLORIDE ER 10 MEQ PO CPCR: ORAL_CAPSULE | ORAL | Status: DC

## 2014-10-04 ENCOUNTER — Other Ambulatory Visit: Payer: Self-pay | Admitting: Internal Medicine

## 2014-10-04 ENCOUNTER — Encounter: Payer: Self-pay | Admitting: Family Medicine

## 2014-10-05 NOTE — Telephone Encounter (Signed)
See mychart message I just wrote her today. Essentially sounds like she is taking it more than we discussed and needs to see me if she needs more than this.

## 2014-10-05 NOTE — Telephone Encounter (Signed)
Ok to refill 

## 2014-10-06 ENCOUNTER — Telehealth: Payer: Self-pay | Admitting: Pulmonary Disease

## 2014-10-06 ENCOUNTER — Encounter: Payer: Self-pay | Admitting: Family Medicine

## 2014-10-06 NOTE — Telephone Encounter (Signed)
Auto CPAP 09/13/14 to 09/28/14 >> used on 13 of 16 nights with average 3 hrs and 24 min.  Average AHI is 2.1 with median CPAP 7 cm H2O and 95 th percentile CPAP 11 cm H20.   Will have my nurse inform pt that CPAP report shows good control of sleep apnea with auto setting.

## 2014-10-07 ENCOUNTER — Encounter: Payer: Self-pay | Admitting: Family Medicine

## 2014-10-07 ENCOUNTER — Ambulatory Visit (INDEPENDENT_AMBULATORY_CARE_PROVIDER_SITE_OTHER): Payer: Medicare Other | Admitting: Family Medicine

## 2014-10-07 VITALS — BP 145/89 | HR 94 | Temp 98.3°F | Ht 64.0 in | Wt 303.0 lb

## 2014-10-07 DIAGNOSIS — A084 Viral intestinal infection, unspecified: Secondary | ICD-10-CM | POA: Diagnosis not present

## 2014-10-07 MED ORDER — ALPRAZOLAM 0.25 MG PO TABS: ORAL_TABLET | ORAL | Status: DC

## 2014-10-07 MED ORDER — ONDANSETRON HCL 8 MG PO TABS: 8.0000 mg | ORAL_TABLET | Freq: Three times a day (TID) | ORAL | Status: DC | PRN

## 2014-10-07 NOTE — Progress Notes (Signed)
Pre visit review using our clinic review tool, if applicable. No additional management support is needed unless otherwise documented below in the visit note. 

## 2014-10-07 NOTE — Telephone Encounter (Signed)
Tamara Morales provide new rx to patient in method she would prefer-I printed and placed on your desk.   Charting would simply be writing down on a piece of paper the dates and times you use the medicine.

## 2014-10-07 NOTE — Progress Notes (Signed)
   Subjective:    Patient ID: Tamara Morales, female    DOB: 01/19/1931, 81 y.o.   MRN: [id number]  HPI Here for 3 days of stomach cramps, nausea, diarrhea, and weakness. No SOB or fever. She vomited once the first day but not since then. She is sipping fluids.    Review of Systems  Constitutional: Positive for fatigue. Negative for fever, chills and diaphoresis.  HENT: Negative.   Eyes: Negative.   Respiratory: Negative.   Cardiovascular: Negative.   Gastrointestinal: Positive for nausea, abdominal pain and diarrhea. Negative for vomiting, constipation, blood in stool, abdominal distention and anal bleeding.  Genitourinary: Negative.   Neurological: Negative.        Objective:   Physical Exam  Constitutional: She is oriented to person, place, and time. No distress.  Alert, walks with her canes  Cardiovascular: Normal rate, regular rhythm, normal heart sounds and intact distal pulses.   Pulmonary/Chest: Effort normal and breath sounds normal.  Abdominal: Soft. Bowel sounds are normal. She exhibits no distension. There is no tenderness. There is no rebound and no guarding.  Neurological: She is alert and oriented to person, place, and time.          Assessment & Plan:  She has a GI virus and she seems to be over the worst of it. Given Zofran for nausea. She will drink plenty of fluids today and slowly advance her diet as tolerated. She will take her usual meds today but I asked her to not take the Lasix for several days. Recheck prn.

## 2014-10-08 ENCOUNTER — Telehealth: Payer: Self-pay | Admitting: *Deleted

## 2014-10-08 NOTE — Telephone Encounter (Signed)
PLEASE NOTE: All timestamps contained within this report are represented as [place] Standard Time. CONFIDENTIALTY NOTICE: This fax transmission is intended only for the addressee. It contains information that is legally privileged, confidential or otherwise protected from use or disclosure. If you are not the intended recipient, you are strictly prohibited from reviewing, disclosing, copying using or disseminating any of this information or taking any action in reliance on or regarding this information. If you have received this fax in error, please notify us immediately by telephone so that we can arrange for its return to [place]. Phone: [phone], Toll-Free: [phone], Fax: [phone] Page: 1 of 1 Call Id: [id number] [facility] Night - Client [place] Patient Name: JACHELLE FLUTY Gender: Female DOB: 11/29/1930 Age: 81 Y 27 D Return Phone Number: Address: City/State/Zip: [place] [employer] [facility] Night - Client Client Site [place] - Night Physician Garret Reddish Contact Type Call Call Type Page Only Caller Name CATHY Relationship To Patient Provider Is this call to report lab results? No Return Phone Number Unavailable Initial Comment Tye [place] w/[facility], [phone], states pt c/o nausea, vomiting, diarrhea, is asking for 1X order for Phenergan suppository, no allergy to it in the past Nurse Assessment Guidelines Guideline Title Affirmed Question Affirmed Notes Nurse Date/Time (Eastern Time) Disp. Time Eilene Ghazi Time) Disposition Final User 10/05/2014 1:33:31 AM Send to [place], [place] 10/05/2014 1:42:45 AM Paged On Call back to Call Hard[place], [place] 10/05/2014 1:46:34 AM Page Completed Yes Clydene Laming, Amy After Care Instructions Given Call Event Type User Date / Time Description Paging DoctorName Phone DateTime Result/Outcome Message Type  Notes Alysia Penna [phone] 10/05/2014 1:42:44 AM Paged On Call Back to Call Center Doctor Paged Please call Amy @ [facility]. [place] 10/05/2014 1:46:27 AM Spoke with On Call - General Message Result On call returned page. Information provided. On call transferred to caller.   --Patient had office visit with MD Sarajane Jews on 4/20

## 2014-10-13 NOTE — Telephone Encounter (Signed)
Results have been explained to patient, pt expressed understanding.  Pt reports getting a new mask- fits well and she is very pleased. Nothing further needed.

## 2014-10-21 ENCOUNTER — Other Ambulatory Visit: Payer: Self-pay | Admitting: Internal Medicine

## 2014-10-27 ENCOUNTER — Other Ambulatory Visit: Payer: Self-pay | Admitting: Family Medicine

## 2014-11-03 ENCOUNTER — Encounter (HOSPITAL_COMMUNITY): Payer: Self-pay | Admitting: Emergency Medicine

## 2014-11-03 ENCOUNTER — Emergency Department (HOSPITAL_COMMUNITY): Payer: Medicare Other

## 2014-11-03 ENCOUNTER — Emergency Department (HOSPITAL_COMMUNITY)
Admission: EM | Admit: 2014-11-03 | Discharge: 2014-11-03 | Disposition: A | Discharge status: Home / Self Care | Payer: Medicare Other | Attending: Emergency Medicine | Admitting: Emergency Medicine

## 2014-11-03 DIAGNOSIS — R112 Nausea with vomiting, unspecified: Secondary | ICD-10-CM | POA: Diagnosis not present

## 2014-11-03 DIAGNOSIS — Z79899 Other long term (current) drug therapy: Secondary | ICD-10-CM | POA: Insufficient documentation

## 2014-11-03 DIAGNOSIS — R0602 Shortness of breath: Secondary | ICD-10-CM | POA: Diagnosis not present

## 2014-11-03 DIAGNOSIS — G4733 Obstructive sleep apnea (adult) (pediatric): Secondary | ICD-10-CM | POA: Diagnosis not present

## 2014-11-03 DIAGNOSIS — I10 Essential (primary) hypertension: Secondary | ICD-10-CM | POA: Insufficient documentation

## 2014-11-03 DIAGNOSIS — F329 Major depressive disorder, single episode, unspecified: Secondary | ICD-10-CM | POA: Diagnosis not present

## 2014-11-03 DIAGNOSIS — R05 Cough: Secondary | ICD-10-CM | POA: Insufficient documentation

## 2014-11-03 DIAGNOSIS — Z9981 Dependence on supplemental oxygen: Secondary | ICD-10-CM | POA: Insufficient documentation

## 2014-11-03 DIAGNOSIS — R630 Anorexia: Secondary | ICD-10-CM | POA: Diagnosis not present

## 2014-11-03 DIAGNOSIS — K219 Gastro-esophageal reflux disease without esophagitis: Secondary | ICD-10-CM | POA: Diagnosis not present

## 2014-11-03 DIAGNOSIS — R110 Nausea: Secondary | ICD-10-CM | POA: Diagnosis not present

## 2014-11-03 DIAGNOSIS — I499 Cardiac arrhythmia, unspecified: Secondary | ICD-10-CM | POA: Diagnosis not present

## 2014-11-03 DIAGNOSIS — M170 Bilateral primary osteoarthritis of knee: Secondary | ICD-10-CM | POA: Diagnosis not present

## 2014-11-03 DIAGNOSIS — R600 Localized edema: Secondary | ICD-10-CM | POA: Diagnosis not present

## 2014-11-03 DIAGNOSIS — E890 Postprocedural hypothyroidism: Secondary | ICD-10-CM | POA: Diagnosis not present

## 2014-11-03 DIAGNOSIS — Z7982 Long term (current) use of aspirin: Secondary | ICD-10-CM | POA: Insufficient documentation

## 2014-11-03 DIAGNOSIS — Z9889 Other specified postprocedural states: Secondary | ICD-10-CM | POA: Insufficient documentation

## 2014-11-03 DIAGNOSIS — Z853 Personal history of malignant neoplasm of breast: Secondary | ICD-10-CM | POA: Insufficient documentation

## 2014-11-03 DIAGNOSIS — Z87891 Personal history of nicotine dependence: Secondary | ICD-10-CM | POA: Diagnosis not present

## 2014-11-03 DIAGNOSIS — E785 Hyperlipidemia, unspecified: Secondary | ICD-10-CM | POA: Insufficient documentation

## 2014-11-03 DIAGNOSIS — R0902 Hypoxemia: Secondary | ICD-10-CM | POA: Diagnosis not present

## 2014-11-03 DIAGNOSIS — J984 Other disorders of lung: Secondary | ICD-10-CM | POA: Diagnosis not present

## 2014-11-03 DIAGNOSIS — R6889 Other general symptoms and signs: Secondary | ICD-10-CM | POA: Diagnosis not present

## 2014-11-03 LAB — CBC WITH DIFFERENTIAL/PLATELET
BASOS PCT: 0 % (ref 0–1)
Basophils Absolute: 0 10*3/uL (ref 0.0–0.1)
Eosinophils Absolute: 0.1 10*3/uL (ref 0.0–0.7)
Eosinophils Relative: 2 % (ref 0–5)
HEMATOCRIT: 39.5 % (ref 36.0–46.0)
HEMOGLOBIN: 13 g/dL (ref 12.0–15.0)
Lymphocytes Relative: 17 % (ref 12–46)
Lymphs Abs: 1.2 10*3/uL (ref 0.7–4.0)
MCH: 30 pg (ref 26.0–34.0)
MCHC: 32.9 g/dL (ref 30.0–36.0)
MCV: 91 fL (ref 78.0–100.0)
MONO ABS: 0.5 10*3/uL (ref 0.1–1.0)
MONOS PCT: 8 % (ref 3–12)
Neutro Abs: 4.9 10*3/uL (ref 1.7–7.7)
Neutrophils Relative %: 73 % (ref 43–77)
Platelets: 172 10*3/uL (ref 150–400)
RBC: 4.34 MIL/uL (ref 3.87–5.11)
RDW: 14.1 % (ref 11.5–15.5)
WBC: 6.7 10*3/uL (ref 4.0–10.5)

## 2014-11-03 LAB — COMPREHENSIVE METABOLIC PANEL
ALT: 14 U/L (ref 14–54)
AST: 22 U/L (ref 15–41)
Albumin: 3.3 g/dL — ABNORMAL LOW (ref 3.5–5.0)
Alkaline Phosphatase: 84 U/L (ref 38–126)
Anion gap: 6 (ref 5–15)
BUN: 17 mg/dL (ref 6–20)
CO2: 27 mmol/L (ref 22–32)
Calcium: 7.8 mg/dL — ABNORMAL LOW (ref 8.9–10.3)
Chloride: 107 mmol/L (ref 101–111)
Creatinine, Ser: 0.73 mg/dL (ref 0.44–1.00)
GFR calc Af Amer: >60 mL/min (ref >60)
GFR calc non Af Amer: >60 mL/min (ref >60)
Glucose, Bld: 122 mg/dL — ABNORMAL HIGH (ref 65–99)
Potassium: 3.5 mmol/L (ref 3.5–5.1)
SODIUM: 140 mmol/L (ref 135–145)
Total Bilirubin: 1 mg/dL (ref 0.3–1.2)
Total Protein: 6.8 g/dL (ref 6.5–8.1)

## 2014-11-03 LAB — BRAIN NATRIURETIC PEPTIDE: B NATRIURETIC PEPTIDE 5: 63.6 pg/mL (ref 0.0–100.0)

## 2014-11-03 LAB — I-STAT TROPONIN, ED: TROPONIN I, POC: 0 ng/mL (ref 0.00–0.08)

## 2014-11-03 MED ORDER — IOHEXOL 350 MG/ML SOLN
100.0000 mL | Freq: Once | INTRAVENOUS | Status: AC | PRN
  Administered 2014-11-03: 100 mL via INTRAVENOUS

## 2014-11-03 MED ORDER — ONDANSETRON HCL 4 MG/2ML IJ SOLN
4.0000 mg | Freq: Once | INTRAMUSCULAR | Status: AC
  Administered 2014-11-03: 4 mg via INTRAVENOUS
  Filled 2014-11-03: qty 2

## 2014-11-03 MED ORDER — ONDANSETRON HCL 4 MG PO TABS: 4.0000 mg | ORAL_TABLET | Freq: Four times a day (QID) | ORAL | Status: DC

## 2014-11-03 NOTE — ED Notes (Signed)
Oxygen saturation 82 to 84% Humidified oxygen applied at 2 l/min via [place]

## 2014-11-03 NOTE — ED Notes (Signed)
Per EMS pt woke up tonight with nausea  Denies pain  Pt has swelling in her lower extremities

## 2014-11-03 NOTE — ED Notes (Signed)
Patient ambulated 40 feet with the assistance of a walker. At the beginning of the ambulation patient's O2 saturation was 85% on room air. At the end of the ambulation patient's O2 saturation was at 80%.

## 2014-11-03 NOTE — ED Notes (Signed)
Pt given Coke for Fluid Challenge. Pt states that she doesn't feel nauseated like she did when she came in.

## 2014-11-03 NOTE — ED Notes (Addendum)
Pt 85% O2 sat when taken off nasal cannula to room air and sitting on side of bed.  Pt got up with staff assistance and walker to ambulate approx 40 feet and back to room went to 72% on room air.

## 2014-11-03 NOTE — ED Notes (Signed)
Pt is a resident at [facility]

## 2014-11-03 NOTE — ED Provider Notes (Signed)
CSN: [id number]     Arrival date & time 11/03/14  0533 History   First MD Initiated Contact with Patient 11/03/14 0636     Chief Complaint  Patient presents with  . Nausea     (Consider location/radiation/quality/duration/timing/severity/associated sxs/prior Treatment) HPI Pt is an 81yo morbidly obese female with hx of depression, GERD, hyperlipidemia, HTN, hypothyroidism, and OSA for which she wears CPAP at night, brought to ED by EMS from [facility] via EMS for further evaluation of nausea and SOB.  Pt states she woke this morning with nausea so she called staff who found she had elevated BP and HR.   Pt denies vomiting or diarrhea. Pt does report lower leg swelling but states they have been swelling for the last 4-5 months. Currently on $RemoveBefo'40mg'cFeHBwrAxnH$  lasix daily. Denies hx of CHF. Denies fever, chills, CP, SOB, or abdominal pain. Denies urinary symptoms.   Past Medical History  Diagnosis Date  . Morbid obesity   . MIGRAINE HEADACHE   . ARTHRITIS, KNEES, BILATERAL   . DEPRESSION   . GERD   . HYPERLIPIDEMIA   . HYPERTENSION   . Breast CA 2000    s/p R lumpectomy and XRT  . DJD (degenerative joint disease) of knee   . Urge incontinence   . HYPOTHYROIDISM     postsurgical  . OSA (obstructive sleep apnea) 01/17/2011 dx   Past Surgical History  Procedure Laterality Date  . Cholecystectomy  1995  . Breast lumpectomy  2000  . Thyroidectomy    . Breast biopsy  2000  . Replacement total knee bilateral  1999, 2005  . Tubal ligation    . Parathyroidectomy      2-3 removed  . Tonsillectomy    . Adenoidectomy    . Right leg femur repaired     Family History  Problem Relation Age of Onset  . Emphysema Sister   . Coronary artery disease Neg Hx    History  Substance Use Topics  . Smoking status: Former Smoker -- 0.25 packs/day for 2 years    Types: Cigarettes    Quit date: 06/19/1974  . Smokeless tobacco: Never Used     Comment: Quit in late 20's  . Alcohol Use: 0.0 oz/week     0 Standard drinks or equivalent per week     Comment: rare   OB History    No data available     Review of Systems  Constitutional: Positive for appetite change. Negative for fever, chills and fatigue.  Respiratory: Positive for cough and shortness of breath.   Cardiovascular: Positive for leg swelling ( bilateral (4-5 months)). Negative for chest pain and palpitations.  Gastrointestinal: Positive for nausea. Negative for vomiting, abdominal pain and diarrhea.  Genitourinary: Negative for dysuria, frequency, hematuria and flank pain.  All other systems reviewed and are negative.     Allergies  Azithromycin; Beta adrenergic blockers; Ciprofloxacin; Codeine; Epinephrine; Klonopin; Oxycodone; Paxil; Prednisone; Propoxyphene hcl; and Meloxicam  Home Medications   Prior to Admission medications   Medication Sig Start Date End Date Taking? Authorizing Provider  ALPRAZolam (XANAX) 0.25 MG tablet TAKE 1/2 to 1 TABLET BY MOUTH TWICE A DAY AS NEEDED FOR ANXIETY OR SLEEP 10/07/14  Yes Marin Olp, MD  aspirin 81 MG tablet Take 81 mg by mouth every morning.    Yes Historical Provider, MD  FIBER PO Take 2 tablets by mouth every morning.    Yes Historical Provider, MD  furosemide (LASIX) 40 MG tablet TAKE 1  TABLET BY MOUTH ONCE DAILY 10/21/14  Yes Marin Olp, MD  levothyroxine (SYNTHROID, LEVOTHROID) 175 MCG tablet Take 1 tablet (175 mcg total) by mouth daily before breakfast. 12/22/13  Yes Rowe Clack, MD  loratadine (CLARITIN) 10 MG tablet Take 10 mg by mouth daily as needed for allergies.    Yes Historical Provider, MD  nystatin (MYCOSTATIN/NYSTOP) 100000 UNIT/GM POWD APPLY TO AFFECTED AREA TWICE A DAY Patient taking differently: APPLY TO AFFECTED AREA TWICE A DAY AS NEEDED FOR SKIN IRRITATION 10/27/14  Yes Marin Olp, MD  Omega-3 Fatty Acids (FISH OIL) 1200 MG CAPS Take 2 capsules by mouth every morning.    Yes Historical Provider, MD  omeprazole (PRILOSEC) 20 MG capsule  Take 20 mg by mouth 2 (two) times daily. 10/21/14  Yes Historical Provider, MD  ondansetron (ZOFRAN) 8 MG tablet Take 1 tablet (8 mg total) by mouth every 8 (eight) hours as needed for nausea or vomiting. 10/07/14  Yes Laurey Morale, MD  phenazopyridine (AZO-TABS) 95 MG tablet Take 95 mg by mouth daily.    Yes Historical Provider, MD  potassium chloride (MICRO-K) 10 MEQ CR capsule TAKE 2 CAPSULE BY MOUTH ONCE DAILY 09/28/14  Yes Marin Olp, MD  senna-docusate (SENOKOT-S) 8.6-50 MG per tablet Take 2 tablets by mouth at bedtime as needed for mild constipation. 08/29/13  Yes Rowe Clack, MD  TRIBENZOR 20-5-12.5 MG TABS TAKE 1 TABLET BY MOUTH EVERY DAY 02/16/14  Yes Rowe Clack, MD  ondansetron (ZOFRAN) 4 MG tablet Take 1 tablet (4 mg total) by mouth every 6 (six) hours. 11/03/14   Noland Fordyce, PA-C   BP 137/74 mmHg  Pulse 94  Temp(Src) 97.6 F (36.4 C) (Oral)  Resp 18  SpO2 93% Physical Exam  Constitutional: She appears well-developed and well-nourished. No distress.  HENT:  Head: Normocephalic and atraumatic.  Eyes: Conjunctivae are normal. No scleral icterus.  Neck: Normal range of motion.  Cardiovascular: Normal rate, regular rhythm and normal heart sounds.   Pulmonary/Chest: Effort normal and breath sounds normal. No respiratory distress. She has no wheezes. She has no rales. She exhibits no tenderness.  Abdominal: Soft. Bowel sounds are normal. She exhibits no distension and no mass. There is no tenderness. There is no rebound and no guarding.  Musculoskeletal: Normal range of motion. She exhibits edema.  2+ pitting edema bilateral in lower extremities.   Neurological: She is alert.  Skin: Skin is warm and dry. She is not diaphoretic.  Nursing note and vitals reviewed.   ED Course  Procedures (including critical care time) Labs Review Labs Reviewed  COMPREHENSIVE METABOLIC PANEL - Abnormal; Notable for the following:    Glucose, Bld 122 (*)    Calcium 7.8 (*)     Albumin 3.3 (*)    All other components within normal limits  CBC WITH DIFFERENTIAL/PLATELET  BRAIN NATRIURETIC PEPTIDE  URINALYSIS, ROUTINE W REFLEX MICROSCOPIC  I-STAT TROPOININ, ED    Imaging Review Dg Chest 2 View  11/03/2014   CLINICAL DATA:  81 year old female who awoke with nausea and irregular heart rate. Initial encounter.  EXAM: CHEST  2 VIEW  COMPARISON:  11/16/2013 and earlier.  FINDINGS: Chronic dextro convex scoliosis. Stable lung volumes. Stable cardiac size and mediastinal contours. No pneumothorax, pulmonary edema or pleural effusion. No consolidation. Chronic right suprahilar streaky opacity not significantly changed. Overlying chronic postoperative changes to the right axilla and chest wall. No pneumoperitoneum. Negative visible bowel gas pattern. Chronic right lateral rib fractures. No  acute osseous abnormality identified. Stable cholecystectomy clips.  IMPRESSION: Stable.  No acute cardiopulmonary abnormality.   Electronically Signed   By: Genevie Ann M.D.   On: 11/03/2014 07:26   Ct Angio Chest Pe W/cm &/or Wo Cm  11/03/2014   CLINICAL DATA:  81 year old with decreased O2 saturations. Shortness of breath with cough. History of breast cancer.  EXAM: CT ANGIOGRAPHY CHEST WITH CONTRAST  TECHNIQUE: Multidetector CT imaging of the chest was performed using the standard protocol during bolus administration of intravenous contrast. Multiplanar CT image reconstructions and MIPs were obtained to evaluate the vascular anatomy.  CONTRAST:  167mL OMNIPAQUE IOHEXOL 350 MG/ML SOLN  COMPARISON:  08/21/2013 and 01/02/2011  FINDINGS: The study has technical limitations due to motion artifact and poor opacification of the pulmonary arteries. There is no evidence to suggest a large or central pulmonary embolism. Atherosclerotic calcifications in the thoracic aorta without aneurysm. The great vessels are patent.  Surgical clips and calcifications in the right breast tissue consistent with previous  lumpectomy. Small surgical clips in the expected region of the thyroid and suspect of prior thyroidectomy. There is no significant mediastinal, hilar or axillary lymphadenopathy. Surgical clips in the right axilla. No significant pericardial or pleural fluid.  Again noted is a low-density lesion along the anterior spleen which has been present since at least 2012 and likely represents a benign etiology such as a hemangioma. Otherwise, the visualized upper abdomen is unremarkable.  The trachea and mainstem bronchi are patent. Scattered patchy densities in both lungs probably represent areas of atelectasis. Difficult to exclude mild edema. No large areas of consolidation.  There is scoliosis in the thoracic spine. There are old posterior right rib fractures. No acute bone abnormality.  Review of the MIP images confirms the above findings.  IMPRESSION: No evidence for a large or central pulmonary embolism as described.  Patchy densities in both lungs probably represent scattered areas of atelectasis. No large areas of airspace disease or consolidation.  Postsurgical changes in the neck and right breast.   Electronically Signed   By: Markus Daft M.D.   On: 11/03/2014 08:56     EKG Interpretation   Date/Time:  Tuesday Nov 03 2014 07:05:21 EDT Ventricular Rate:  86 PR Interval:  172 QRS Duration: 93 QT Interval:  367 QTC Calculation: 439 R Axis:   20 Text Interpretation:  Sinus rhythm Abnormal R-wave progression, early  transition Confirmed by OTTER  MD, OLGA ([id number]) on 11/03/2014 7:20:44 AM      MDM   Final diagnoses:  Nausea  Hypoxia  Bilateral leg edema    Pt is an 81yo female presenting to ED with c/o nausea and lower leg swelling, cough and SOB.  Pt does have hx of OSA, on CPAP at night but no O2 during the day.  No hx of CHF.   Cardiac workup unremarkable. No evidence of CHF, negative troponin. Unremarkable EKG and CXR. O2 Sat dropped to 84% on RA. Pt placed on 2L O2, Sats improved to  93-94%.   Discussed unremarkable workup with Dr. Sharol Given who also examined pt.  Recommended ambulating pt as pt has been lying in bed, may be causing atelectasis to decrease her states.  While ambulating, O2 Sat dropped to 72% on RA.  RN states pt appeared SOB but was addiment she wants to go home. States she rarely walks at home, she uses a power chair.  Discussed Sats of 72% with new oncoming provider, Dr. Betsey Holiday, will get CT angio chest to  r/o PE or early onset pneumonia missed on plain films. CT angio chest: unreamarkable. No emergent cause for pt's hypoxemia. Discussed findings with pt.  Pt still insistent and wanting to be discharged home. Strongly encouraged pt to f/u with her PCP as she may need referral to pulmonology and may need to wear oxygen during the day and night. Pt stated she will not wear oxygen during the day.  Family with pt agrees pt has a stubborn personality.  Return precautions provided. Pt verbalized understanding and agreement with tx plan.    Noland Fordyce, PA-C 11/03/14 5615  Orpah Greek, MD 11/03/14 [phone]

## 2014-11-03 NOTE — Discharge Instructions (Signed)
Edema  Edema is an abnormal buildup of fluids. It is more common in your legs and thighs. Painless swelling of the feet and ankles is more likely as a person ages. It also is common in looser skin, like around your eyes.  HOME CARE   · Keep the affected body part above the level of the heart while lying down.  · Do not sit still or stand for a long time.  · Do not put anything right under your knees when you lie down.  · Do not wear tight clothes on your upper legs.  · Exercise your legs to help the puffiness (swelling) go down.  · Wear elastic bandages or support stockings as told by your doctor.  · A low-salt diet may help lessen the puffiness.  · Only take medicine as told by your doctor.  GET HELP IF:  · Treatment is not working.  · You have heart, liver, or kidney disease and notice that your skin looks puffy or shiny.  · You have puffiness in your legs that does not get better when you raise your legs.  · You have sudden weight gain for no reason.  GET HELP RIGHT AWAY IF:   · You have shortness of breath or chest pain.  · You cannot breathe when you lie down.  · You have pain, redness, or warmth in the areas that are puffy.  · You have heart, liver, or kidney disease and get edema all of a sudden.  · You have a fever and your symptoms get worse all of a sudden.  MAKE SURE YOU:   · Understand these instructions.  · Will watch your condition.  · Will get help right away if you are not doing well or get worse.  Document Released: 11/22/2007 Document Revised: 06/10/2013 Document Reviewed: 03/28/2013  ExitCare® Patient Information ©2015 [employer]. This information is not intended to replace advice given to you by your health care provider. Make sure you discuss any questions you have with your health care provider.

## 2014-11-03 NOTE — ED Notes (Signed)
Patient transported to CT 

## 2014-11-04 ENCOUNTER — Other Ambulatory Visit: Payer: Self-pay | Admitting: Pulmonary Disease

## 2014-11-04 ENCOUNTER — Ambulatory Visit: Payer: Medicare Other | Admitting: Pulmonary Disease

## 2014-11-04 DIAGNOSIS — G4733 Obstructive sleep apnea (adult) (pediatric): Secondary | ICD-10-CM

## 2014-11-05 ENCOUNTER — Encounter: Payer: Self-pay | Admitting: Pulmonary Disease

## 2014-11-05 ENCOUNTER — Ambulatory Visit (INDEPENDENT_AMBULATORY_CARE_PROVIDER_SITE_OTHER): Payer: Medicare Other | Admitting: Pulmonary Disease

## 2014-11-05 VITALS — BP 110/62 | HR 101 | Ht 64.0 in | Wt 307.8 lb

## 2014-11-05 DIAGNOSIS — E662 Morbid (severe) obesity with alveolar hypoventilation: Secondary | ICD-10-CM

## 2014-11-05 DIAGNOSIS — Z9989 Dependence on other enabling machines and devices: Secondary | ICD-10-CM

## 2014-11-05 DIAGNOSIS — G4733 Obstructive sleep apnea (adult) (pediatric): Secondary | ICD-10-CM

## 2014-11-05 DIAGNOSIS — Z6841 Body Mass Index (BMI) 40.0 and over, adult: Secondary | ICD-10-CM

## 2014-11-05 NOTE — Patient Instructions (Signed)
Will set up with portable O2 through [facility] Follow up in 4 months.

## 2014-11-05 NOTE — Progress Notes (Signed)
Chief Complaint  Patient presents with  . Follow-up    Pt using CPAP nightly. Pt seen in [place] for nausea and hypoxia. Pt reports that a stomach bug his going around her home she lives in. Pt using 2L/min O2 at bedtime. Low O2 upon arrival 83%, recovered to 92% on RA at rest.    History of Present Illness: Tamara Morales is a 81 y.o. female with severe OSA using CPAP and 2 liters oxygen.  She was in the ER a few days ago due to low oxygen level.  CT chest showed ATX, ECG showed sinus rhythm, and labs were unremarkable.  She gets feeling of headache, confusion.  She feels anxious and sweaty when this happens.  She was concerned this could be from low blood sugar and tried to eat something.  She uses her CPAP for about 4 hours.  She will then sleep in a recliner w/o CPAP.  She will use her oxygen when sleeping in recliner.  She has not been using oxygen during the day.   TESTS: PSG 01/22/11 >> AHI 74.3, SpO2 low 76%. ONO with RA and CPAP 04/21/11 >> Test time 5 hr 12 min. Basal SpO2 87%, low SpO2 73%. Spent 161 min with SpO2 < 88%. Echo 04/01/13 >> EF 55 to 60%, PAS 43 mmHg CPAP 05/11/12 to 08/08/12 >> used on 89 of 90 nights with average 4 hrs 38 min.  Average AHI 6.7 with CPAP 10 cm H2O. CPAP 10/11/12 to 01/08/13 >> Used on 88 of 90 nights with average 5 hrs 15 min. Average AHI 5.3 with CPAP 11 cm H2O. Auto CPAP 09/13/14 to 09/28/14 >> used on 13 of 16 nights with average 3 hrs and 24 min. Average AHI is 2.1 with median CPAP 7 cm H2O and 95 th percentile CPAP 11 cm H20. CT chest 11/03/14 >> atherosclerosis, patchy atelectasis, scoliosis, old Rt fib fx  PMHx >> HA, OA, Depression GERD, HLD, HTN, Breast cancer in 2000, Hypothyroidism  PSHx, Medications, Allergies, Fhx, Shx reviewed.   Physical Exam: Blood pressure 110/62, pulse 101, height $RemoveBef'5\' 4"'eWznZHlrqr$  (1.626 m), weight 307 lb 12.8 oz (139.617 kg), SpO2 92 %. Body mass index is 52.81 kg/(m^2).  General - No distress ENT - No sinus tenderness,  no oral exudate, no LAN Cardiac - s1s2 regular, no murmur Chest - No wheeze/rales/dullness Back - No focal tenderness Abd - Soft, non-tender Ext - 1+ edema Neuro - Normal strength Skin - No rashes Psych - normal mood, and behavior   CMP Latest Ref Rng 11/03/2014 07/29/2014 01/29/2014  Glucose 65 - 99 mg/dL 122(H) 100(H) 104(H)  BUN 6 - 20 mg/dL $Remove'17 20 18  'UsHQgWc$ Creatinine 0.44 - 1.00 mg/dL 0.73 0.82 1.0  Sodium 135 - 145 mmol/L 140 141 141  Potassium 3.5 - 5.1 mmol/L 3.5 3.9 3.3(L)  Chloride 101 - 111 mmol/L 107 101 101  CO2 22 - 32 mmol/L 27 33(H) 29  Calcium 8.9 - 10.3 mg/dL 7.8(L) 8.8 8.9  Total Protein 6.5 - 8.1 g/dL 6.8 6.9 6.7  Total Bilirubin 0.3 - 1.2 mg/dL 1.0 0.5 0.4  Alkaline Phos 38 - 126 U/L 84 99 80  AST 15 - 41 U/L $Remo'22 17 19  'DyjfO$ ALT 14 - 54 U/L $Remo'14 15 14    'VwoaF$ CBC Latest Ref Rng 11/03/2014 01/29/2014 11/16/2013  WBC 4.0 - 10.5 K/uL 6.7 6.3 -  Hemoglobin 12.0 - 15.0 g/dL 13.0 13.5 14.6  Hematocrit 36.0 - 46.0 % 39.5 39.7 43.0  Platelets 150 -  400 K/uL 172 182.0 -    BNP    Component Value Date/Time   BNP 63.6 11/03/2014 0638     Assessment/Plan:  Obstructive sleep apnea. Plan: - continue auto CPAP - explained importance of using CPAP whenever she is asleep  Obesity hypoventilation syndrome. Plan: - she will need to use 2 liters oxygen 24/7 - discussed symptoms to monitor for when her oxygen level is getting low - she will monitor her oxygen levels with portable pulse oximeter  Morbid obesity. Plan: - discussed options for assisting with weight loss  Updated her daughter during examination   Chesley Mires, MD [facility] Pager:  [phone] 11/05/2014, 11:55 AM

## 2014-11-06 ENCOUNTER — Other Ambulatory Visit: Payer: Self-pay | Admitting: Family Medicine

## 2014-11-06 ENCOUNTER — Other Ambulatory Visit: Payer: Self-pay | Admitting: Internal Medicine

## 2014-11-06 ENCOUNTER — Telehealth: Payer: Self-pay | Admitting: Pulmonary Disease

## 2014-11-06 NOTE — Telephone Encounter (Signed)
Pt aware of recs.  Nothing further needed. 

## 2014-11-06 NOTE — Telephone Encounter (Signed)
Please inform pt that this is a relatively low dose, and should be okay to continue using.

## 2014-11-06 NOTE — Telephone Encounter (Signed)
Spoke with pt, has 2 concerns: 1. Pt wants to know if she can take .5 tablet of xanax when she feels anxious and SOB.  This is given to her by PCP to take .5-1 tab bid prn for anxiety/sleep.  Pt wants to make sure this does not suppress her breathing.  2. Pt states that she only wore her 02 last night in her recliner instead of wearing cpap.  Per yesterday's ov note I stressed to her that it's very important for her to wear her cpap when sleeping.  Pt understands this.  Dr. Halford Chessman please advise if you're ok with pt continuing to take xanax with anxiety without concern of affecting pt's breathing.  Thanks!

## 2014-11-09 ENCOUNTER — Emergency Department (HOSPITAL_COMMUNITY): Payer: Medicare Other

## 2014-11-09 ENCOUNTER — Telehealth: Payer: Self-pay | Admitting: Family Medicine

## 2014-11-09 ENCOUNTER — Emergency Department (HOSPITAL_COMMUNITY)
Admission: EM | Admit: 2014-11-09 | Discharge: 2014-11-09 | Disposition: A | Discharge status: Home / Self Care | Payer: Medicare Other | Attending: Emergency Medicine | Admitting: Emergency Medicine

## 2014-11-09 ENCOUNTER — Encounter (HOSPITAL_COMMUNITY): Payer: Self-pay | Admitting: Emergency Medicine

## 2014-11-09 DIAGNOSIS — R630 Anorexia: Secondary | ICD-10-CM | POA: Diagnosis not present

## 2014-11-09 DIAGNOSIS — I10 Essential (primary) hypertension: Secondary | ICD-10-CM

## 2014-11-09 DIAGNOSIS — I129 Hypertensive chronic kidney disease with stage 1 through stage 4 chronic kidney disease, or unspecified chronic kidney disease: Secondary | ICD-10-CM | POA: Diagnosis not present

## 2014-11-09 DIAGNOSIS — Z9981 Dependence on supplemental oxygen: Secondary | ICD-10-CM | POA: Diagnosis not present

## 2014-11-09 DIAGNOSIS — G4733 Obstructive sleep apnea (adult) (pediatric): Secondary | ICD-10-CM | POA: Diagnosis not present

## 2014-11-09 DIAGNOSIS — M1990 Unspecified osteoarthritis, unspecified site: Secondary | ICD-10-CM | POA: Insufficient documentation

## 2014-11-09 DIAGNOSIS — K219 Gastro-esophageal reflux disease without esophagitis: Secondary | ICD-10-CM | POA: Diagnosis not present

## 2014-11-09 DIAGNOSIS — E039 Hypothyroidism, unspecified: Secondary | ICD-10-CM | POA: Diagnosis not present

## 2014-11-09 DIAGNOSIS — Z87891 Personal history of nicotine dependence: Secondary | ICD-10-CM | POA: Diagnosis not present

## 2014-11-09 DIAGNOSIS — R200 Anesthesia of skin: Secondary | ICD-10-CM | POA: Insufficient documentation

## 2014-11-09 DIAGNOSIS — F329 Major depressive disorder, single episode, unspecified: Secondary | ICD-10-CM | POA: Insufficient documentation

## 2014-11-09 DIAGNOSIS — Z79899 Other long term (current) drug therapy: Secondary | ICD-10-CM | POA: Insufficient documentation

## 2014-11-09 DIAGNOSIS — R202 Paresthesia of skin: Secondary | ICD-10-CM | POA: Diagnosis not present

## 2014-11-09 DIAGNOSIS — E785 Hyperlipidemia, unspecified: Secondary | ICD-10-CM | POA: Diagnosis not present

## 2014-11-09 DIAGNOSIS — Z853 Personal history of malignant neoplasm of breast: Secondary | ICD-10-CM | POA: Insufficient documentation

## 2014-11-09 DIAGNOSIS — N189 Chronic kidney disease, unspecified: Secondary | ICD-10-CM | POA: Insufficient documentation

## 2014-11-09 DIAGNOSIS — Z7982 Long term (current) use of aspirin: Secondary | ICD-10-CM | POA: Diagnosis not present

## 2014-11-09 DIAGNOSIS — R110 Nausea: Secondary | ICD-10-CM | POA: Diagnosis not present

## 2014-11-09 DIAGNOSIS — R069 Unspecified abnormalities of breathing: Secondary | ICD-10-CM | POA: Diagnosis not present

## 2014-11-09 HISTORY — DX: Hypoxemia: R09.02

## 2014-11-09 HISTORY — DX: Dependence on supplemental oxygen: Z99.81

## 2014-11-09 LAB — URINALYSIS, ROUTINE W REFLEX MICROSCOPIC
Bilirubin Urine: NEGATIVE
Glucose, UA: NEGATIVE mg/dL
HGB URINE DIPSTICK: NEGATIVE
KETONES UR: NEGATIVE mg/dL
LEUKOCYTES UA: NEGATIVE
Nitrite: NEGATIVE
PH: 7 (ref 5.0–8.0)
Protein, ur: NEGATIVE mg/dL
Specific Gravity, Urine: 1.009 (ref 1.005–1.030)
Urobilinogen, UA: 0.2 mg/dL (ref 0.0–1.0)

## 2014-11-09 LAB — CBC WITH DIFFERENTIAL/PLATELET
Basophils Absolute: 0 10*3/uL (ref 0.0–0.1)
Basophils Relative: 1 % (ref 0–1)
EOS ABS: 0.1 10*3/uL (ref 0.0–0.7)
EOS PCT: 1 % (ref 0–5)
HCT: 41.3 % (ref 36.0–46.0)
Hemoglobin: 13.3 g/dL (ref 12.0–15.0)
Lymphocytes Relative: 17 % (ref 12–46)
Lymphs Abs: 1.3 10*3/uL (ref 0.7–4.0)
MCH: 29.9 pg (ref 26.0–34.0)
MCHC: 32.2 g/dL (ref 30.0–36.0)
MCV: 92.8 fL (ref 78.0–100.0)
MONO ABS: 0.6 10*3/uL (ref 0.1–1.0)
Monocytes Relative: 8 % (ref 3–12)
Neutro Abs: 5.6 10*3/uL (ref 1.7–7.7)
Neutrophils Relative %: 73 % (ref 43–77)
PLATELETS: 174 10*3/uL (ref 150–400)
RBC: 4.45 MIL/uL (ref 3.87–5.11)
RDW: 14.2 % (ref 11.5–15.5)
WBC: 7.6 10*3/uL (ref 4.0–10.5)

## 2014-11-09 LAB — BASIC METABOLIC PANEL
Anion gap: 8 (ref 5–15)
BUN: 19 mg/dL (ref 6–20)
CHLORIDE: 97 mmol/L — AB (ref 101–111)
CO2: 35 mmol/L — AB (ref 22–32)
Calcium: 8.5 mg/dL — ABNORMAL LOW (ref 8.9–10.3)
Creatinine, Ser: 1.15 mg/dL — ABNORMAL HIGH (ref 0.44–1.00)
GFR calc Af Amer: 50 mL/min — ABNORMAL LOW (ref >60)
GFR calc non Af Amer: 43 mL/min — ABNORMAL LOW (ref >60)
Glucose, Bld: 108 mg/dL — ABNORMAL HIGH (ref 65–99)
POTASSIUM: 3.7 mmol/L (ref 3.5–5.1)
Sodium: 140 mmol/L (ref 135–145)

## 2014-11-09 NOTE — Telephone Encounter (Signed)
Noted  

## 2014-11-09 NOTE — ED Notes (Signed)
Awake. Verbally responsive. A/O x4. Resp even and unlabored. No audible adventitious breath sounds noted. ABC's intact. SR on monitor. IV saline lock. Family at bedside.

## 2014-11-09 NOTE — ED Provider Notes (Signed)
CSN: [id number]     Arrival date & time 11/09/14  1049 History   First MD Initiated Contact with Patient 11/09/14 1111     Chief Complaint  Patient presents with  . Hypertension     (Consider location/radiation/quality/duration/timing/severity/associated sxs/prior Treatment) HPI Comments: Is a 81 year old female with history of high blood pressure, hypothyroidism, reflux, sleep apnea, anxiety presents with transient facial numbness bilateral high blood pressure. Patient was at [facility] independent living and had tingling in extremities bilateral and blood pressure was checked 172/100 and was sent to ER. Patient currently has no significant symptoms. No chest pain no shortness of breath no vomiting. Patient has been worked up recently for hypoxia, on home oxygen, had a CAT scan to evaluate for blood clots. Patient denies any fever or coughs. No focal weakness speech changes or other strokelike symptoms. Patient blood pressure is normal in ER without treatment, unsure accuracy of blood pressure prior to arrival.patient has had mild agitation since haven't worsened nasal cannula at home. Patient has had anxiety symptoms for family is any change in her routine.  Patient is a 81 y.o. female presenting with hypertension. The history is provided by the patient.  Hypertension Pertinent negatives include no chest pain, no abdominal pain, no headaches and no shortness of breath.    Past Medical History  Diagnosis Date  . Morbid obesity   . MIGRAINE HEADACHE   . ARTHRITIS, KNEES, BILATERAL   . DEPRESSION   . GERD   . HYPERLIPIDEMIA   . HYPERTENSION   . Breast CA 2000    s/p R lumpectomy and XRT  . DJD (degenerative joint disease) of knee   . Urge incontinence   . HYPOTHYROIDISM     postsurgical  . OSA (obstructive sleep apnea) 01/17/2011 dx  . Hypoxia   . Oxygen dependent    Past Surgical History  Procedure Laterality Date  . Cholecystectomy  1995  . Breast lumpectomy  2000  .  Thyroidectomy    . Breast biopsy  2000  . Replacement total knee bilateral  1999, 2005  . Tubal ligation    . Parathyroidectomy      2-3 removed  . Tonsillectomy    . Adenoidectomy    . Right leg femur repaired     Family History  Problem Relation Age of Onset  . Emphysema Sister   . Coronary artery disease Neg Hx    History  Substance Use Topics  . Smoking status: Former Smoker -- 0.25 packs/day for 2 years    Types: Cigarettes    Quit date: 06/19/1974  . Smokeless tobacco: Never Used     Comment: Quit in late 20's  . Alcohol Use: 0.0 oz/week    0 Standard drinks or equivalent per week     Comment: rare   OB History    Gravida Para Term Preterm AB TAB SAB Ectopic Multiple Living   5 5             Review of Systems  Constitutional: Positive for appetite change and fatigue. Negative for fever and chills.  HENT: Negative for congestion.   Eyes: Negative for visual disturbance.  Respiratory: Negative for shortness of breath.   Cardiovascular: Negative for chest pain.  Gastrointestinal: Negative for vomiting and abdominal pain.  Genitourinary: Negative for dysuria and flank pain.  Musculoskeletal: Negative for back pain, neck pain and neck stiffness.  Skin: Negative for rash.  Neurological: Positive for numbness. Negative for light-headedness and headaches.  Allergies  Azithromycin; Beta adrenergic blockers; Ciprofloxacin; Codeine; Epinephrine; Klonopin; Oxycodone; Paxil; Prednisone; Propoxyphene hcl; and Meloxicam  Home Medications   Prior to Admission medications   Medication Sig Start Date End Date Taking? Authorizing Provider  ALPRAZolam (XANAX) 0.25 MG tablet TAKE 1/2 to 1 TABLET BY MOUTH TWICE A DAY AS NEEDED FOR ANXIETY OR SLEEP Patient taking differently: Take 0.125-0.25 mg by mouth 2 (two) times daily as needed for anxiety or sleep.  10/07/14  Yes Marin Olp, MD  aspirin 81 MG tablet Take 81 mg by mouth every morning.    Yes Historical Provider, MD   FIBER PO Take 2 tablets by mouth every morning.    Yes Historical Provider, MD  furosemide (LASIX) 40 MG tablet TAKE 1 TABLET BY MOUTH ONCE DAILY Patient taking differently: TAKE 40 MG BY MOUTH ONCE DAILY 10/21/14  Yes Marin Olp, MD  loratadine (CLARITIN) 10 MG tablet Take 10 mg by mouth daily as needed for allergies.    Yes Historical Provider, MD  Omega-3 Fatty Acids (FISH OIL) 1200 MG CAPS Take 2,400 mg by mouth daily.    Yes Historical Provider, MD  omeprazole (PRILOSEC) 20 MG capsule TAKE 1 CAPSULE (20 MG TOTAL) BY MOUTH 2 (TWO) TIMES DAILY. 11/06/14  Yes Marin Olp, MD  phenazopyridine (AZO-TABS) 95 MG tablet Take 95 mg by mouth daily.    Yes Historical Provider, MD  potassium chloride (MICRO-K) 10 MEQ CR capsule TAKE 2 CAPSULE BY MOUTH ONCE DAILY Patient taking differently: Take 20 mEq by mouth daily.  09/28/14  Yes Marin Olp, MD  senna-docusate (SENOKOT-S) 8.6-50 MG per tablet Take 2 tablets by mouth at bedtime as needed for mild constipation. 08/29/13  Yes Rowe Clack, MD  SYNTHROID 175 MCG tablet TAKE 1 TABLET BY MOUTH ONCE DAILY BEFORE BREAKFAST Patient taking differently: TAKE 175 MCG BY MOUTH ONCE DAILY BEFORE BREAKFAST 11/06/14  Yes Marin Olp, MD  TRIBENZOR 20-5-12.5 MG TABS TAKE 1 TABLET BY MOUTH EVERY DAY 11/06/14  Yes Marin Olp, MD  nystatin (MYCOSTATIN/NYSTOP) 100000 UNIT/GM POWD APPLY TO AFFECTED AREA TWICE A DAY Patient not taking: Reported on 11/09/2014 10/27/14   Marin Olp, MD  ondansetron (ZOFRAN) 4 MG tablet Take 1 tablet (4 mg total) by mouth every 6 (six) hours. Patient not taking: Reported on 11/09/2014 11/03/14   Noland Fordyce, PA-C  potassium chloride (MICRO-K) 10 MEQ CR capsule TAKE 2 CAPSULE BY MOUTH ONCE DAILY Patient not taking: Reported on 11/09/2014 11/06/14   Marin Olp, MD   BP 112/59 mmHg  Pulse 86  Temp(Src) 97.9 F (36.6 C) (Oral)  Resp 18  SpO2 98% Physical Exam  Constitutional: She is oriented to person,  place, and time. She appears well-developed and well-nourished.  HENT:  Head: Normocephalic and atraumatic.  Eyes: Conjunctivae are normal. Right eye exhibits no discharge. Left eye exhibits no discharge.  Neck: Normal range of motion. Neck supple. No tracheal deviation present.  Cardiovascular: Normal rate and regular rhythm.   Pulmonary/Chest: Effort normal and breath sounds normal.  Abdominal: Soft. She exhibits no distension. There is no tenderness. There is no guarding.  Musculoskeletal: She exhibits no edema.  Neurological: She is alert and oriented to person, place, and time. No cranial nerve deficit.  5+ strength in UE and LE with f/e at major joints. Sensation to palpation intact in UE and LE. CNs 2-12 grossly intact.  EOMFI.  PERRL.   Finger nose and coordination intact bilateral.   Visual  fields intact to finger testing.   Skin: Skin is warm. No rash noted.  Psychiatric: She has a normal mood and affect.  Nursing note and vitals reviewed.   ED Course  Procedures (including critical care time) Labs Review Labs Reviewed  BASIC METABOLIC PANEL - Abnormal; Notable for the following:    Chloride 97 (*)    CO2 35 (*)    Glucose, Bld 108 (*)    Creatinine, Ser 1.15 (*)    Calcium 8.5 (*)    GFR calc non Af Amer 43 (*)    GFR calc Af Amer 50 (*)    All other components within normal limits  URINE CULTURE  URINALYSIS, ROUTINE W REFLEX MICROSCOPIC  CBC WITH DIFFERENTIAL/PLATELET  CBC WITH DIFFERENTIAL/PLATELET  CBC WITH DIFFERENTIAL/PLATELET    Imaging Review Ct Head Wo Contrast  11/09/2014   CLINICAL DATA:  Hypertension, nausea, tingling lower extremities  EXAM: CT HEAD WITHOUT CONTRAST  TECHNIQUE: Contiguous axial images were obtained from the base of the skull through the vertex without intravenous contrast.  COMPARISON:  None.  FINDINGS: No skull fracture is noted. Paranasal sinuses and mastoid air cells are unremarkable.  Moderate cerebral atrophy. Periventricular and  patchy subcortical white matter decreased attenuation probable due to chronic small vessel ischemic changes. No acute cortical infarction. No intracranial hemorrhage, mass effect or midline shift. No mass lesion is noted on this unenhanced scan.  IMPRESSION: No acute intracranial abnormality. Moderate cerebral atrophy. Periventricular and patchy subcortical white matter decreased attenuation probable due to chronic small vessel ischemic changes.   Electronically Signed   By: Lahoma Crocker M.D.   On: 11/09/2014 12:00     EKG Interpretation None      MDM   Final diagnoses:  Facial numbness  Essential hypertension   Patient presents with nonspecific bilateral extremity numbness and facial numbness and nausea. Symptoms resolved during my exam. CT head results reviewed no acute findings. No signs of acute stroke. Patient has outpatient follow-up.  CT head results reviewed no acute findings.  On recheck patient admits she has had multiple recurrent issues with nausea interfering blood pressure. Patient is seen primary Dr. for similar in follow/monitors. Patient is frustrated as she would like an answer to why her blood pressure varies. Her blood pressure has been normal throughout her ER observation. Patient has no fever, blood work reviewed with the patient. I discussed continued follow-up outpatient and reasons to return. Family in the room during this discussion. Patient on home oxygen. Patient normally does not ambulate more than a few steps by herself.  Results and differential diagnosis were discussed with the patient/parent/guardian. Close follow up outpatient was discussed, comfortable with the plan.   Medications - No data to display  Filed Vitals:   11/09/14 1054 11/09/14 1111 11/09/14 1354 11/09/14 1400  BP: 112/69  107/40 112/59  Pulse: 75  70 86  Temp: 97.5 F (36.4 C)  97.9 F (36.6 C)   TempSrc: Oral  Oral   Resp: $Remo'17  18 18  'DGENV$ SpO2: 98% 96% 95% 98%    Final diagnoses:   Facial numbness  Essential hypertension      Elnora Morrison, MD 11/09/14 1537

## 2014-11-09 NOTE — Telephone Encounter (Signed)
FYI

## 2014-11-09 NOTE — Telephone Encounter (Signed)
[facility] - Client TELEPHONE ADVICE RECORD [facility] Patient Name: Tamara Morales Gender: Female DOB: 05/13/1931 Age: 81 Y 2 M 1 D Return Phone Number: [phone] (Primary) Address: [address] City/State/Zip: [place] [address] Client [facility] - Client Client Site [facility] Day Physician Tana Conch Contact Type Call Call Type Triage / Clinical Relationship To Patient Self Appointment Disposition EMR Appointment Not Necessary Info pasted into Epic Yes Return Phone Number [phone] (Primary) Chief Complaint Blood Pressure High Initial Comment Caller states BP is 172/100, nausea and feels bad; PreDisposition Did not know what to do Nurse Assessment Nurse: Roderic Ovens, RN, Amy Date/Time (Eastern Time): 11/09/2014 9:34:45 AM Confirm and document reason for call. If symptomatic, describe symptoms. ---CALLER STATES THAT SHE GOT BP READING OF 172/100 BEFORE SHE TOOK HER BP THIS MORNING. SHES HAD SOME OTHER ISSUES LATELY. SHE IS ON OXYGEN AND FEELS THAT IT WILL GO DOWN. SHE WOKE UP AROUND 0200 THIS MORNING AND SHE WAS NAUSEATED. SHE HAD BEEN IN THE HOSPITAL LATELY AND TOOK SOME ZOFRAN. SHE HAS TAKEN HER BP MEDICATIONS. ALL OF THIS HAS JUST BEEN WITHIN THE LAST 30 MINUTES. SHE WAS IN THE HOSPITAL FOR SHE NOT ENOUGH OXYGEN IN THE BLOOD. SHE HAS BEEN HAVING BREATHING PROBLEMS FOR 7-8 YEARS. SHE HAS BEEN SEEN BY PULMONOLOGIST AND THAT IS WHY SHE IS ON 02 AS OF RECENT. SHE IS NOT HAVING ANY KIND OF CHEST PAIN OR DISCOMFORT AT ALL. Has the patient traveled out of the country within the last 30 days? ---Not Applicable Does the patient require triage? ---Yes Related visit to physician within the last 2 weeks? ---Yes Does the PT have any chronic conditions? (i.e. diabetes, asthma, etc.) ---Yes List chronic conditions. ---LOW OXYGEN LEVELS IN THE BLOOD. SHE  WAS RUNNING 80-87% ON ROOM AIR. HAS JUST BEEN PLACED ON 02. HIGH BP. PLEASE NOTE: All timestamps contained within this report are represented as [place] Standard Time. CONFIDENTIALTY NOTICE: This fax transmission is intended only for the addressee. It contains information that is legally privileged, confidential or otherwise protected from use or disclosure. If you are not the intended recipient, you are strictly prohibited from reviewing, disclosing, copying using or disseminating any of this information or taking any action in reliance on or regarding this information. If you have received this fax in error, please notify us immediately by telephone so that we can arrange for its return to [place]. Phone: [phone], Toll-Free: [phone], Fax: [phone] Page: 2 of 2 Call Id: [id number] Guidelines Guideline Title Affirmed Question Affirmed Notes Nurse Date/Time Lamount Cohen Time) High Blood Pressure [1] BP # 160 / 100 AND [2] cardiac or neurologic symptoms (e.g., chest pain, difficulty breathing, unsteady gait, blurred vision) [place], RN, Amy 11/09/2014 9:37:59 AM Disp. Time Lamount Cohen Time) Disposition Final User 11/09/2014 9:41:57 AM Go to ED Now Yes Roderic Ovens, RN, Amy Caller Understands: Yes Disagree/Comply: Comply Care Advice Given Per Guideline GO TO ED NOW: You need to be seen in the Emergency Department. Go to the ER at ___________ Hospital. Leave now. Drive carefully. CARE ADVICE given per High Blood Pressure (Adult) guideline. After Care Instructions Given Call Event Type User Date / Time Description Comments User: Brent Bulla, RN Date/Time Lamount Cohen Time): 11/09/2014 9:46:02 AM NURSE AT FACILITY TOOK THE BP FOR HER AND GOT THE READING OF 172/100. SHE IS RE-TAKING THE BP TO SEE IF IT HAS COME DOWN ANY BEFORE SHE GOES INTO THE ED. CALLER  STATES THAT HER BP READINGS HAVE BEEN HIGH LATELY SHE JUST DOES NOT KNOW HOW HIGH AND KNOWS THAT SHE NEEDS TO FIND OUT WHY THEY ARE READING SO HIGH.  RE-TAKE BP PER THE NURSE AT THE FACILITY IS; 172/90. [place] IS WHERE SHE IS GOING. SHE HAS BEEN HAVING NUMBNESS/TINGLING IN HER EXTREMITIES, HEADACHES OVER THE PAST FEW DAYS ALONG WITH THE NAUSEA TODAY. Referrals [facility] - ED

## 2014-11-09 NOTE — ED Notes (Signed)
During NIH scale pt reported less feeling to lt side of face but other all test were negative. Denies dizziness/lightheadedness.

## 2014-11-09 NOTE — ED Notes (Signed)
Awake. Verbally responsive. A/O x4. Resp even and unlabored. No audible adventitious breath sounds noted. ABC's intact. SR on monitor. IV saline lock patent and intact. Family at bedside.

## 2014-11-09 NOTE — ED Notes (Signed)
Bed: WA06 Expected date:  Expected time:  Means of arrival:  Comments: EMS - HTN, nausea

## 2014-11-09 NOTE — ED Notes (Addendum)
Pt arrived via EMS from [facility] with c/o hypertension, nausea and tingling in ext. Pt reported awaken with nausea without vomiting. She voiced to the resident nurse and was her BP was 172/100 and the PMD was notified and told to send to ER for evaluation. Tingling in ext has resolved upon arrival. Given Zofran $RemoveBefo'4mg'gaclAYJRXOl$  IV en route and was effective. Pt reported urinary frequency/urgency, foul odor with urine, and burning with void. Pt denies abd/back pain.

## 2014-11-09 NOTE — ED Notes (Signed)
Awake. Verbally responsive. A/O x4. Resp even and unlabored. No audible adventitious breath sounds noted. ABC's intact.  

## 2014-11-09 NOTE — ED Notes (Addendum)
Awake. Verbally responsive. A/O x4. Resp even and unlabored. No audible adventitious breath sounds noted. ABC's intact. SR on monitor. IV saline lock. Family at bedside.

## 2014-11-09 NOTE — ED Notes (Signed)
Patient transported to CT 

## 2014-11-09 NOTE — Discharge Instructions (Signed)
If you were given medicines take as directed.  If you are on coumadin or contraceptives realize their levels and effectiveness is altered by many different medicines.  If you have any reaction (rash, tongues swelling, other) to the medicines stop taking and see a physician.    If your blood pressure was elevated in the ER make sure you follow up for management with a primary doctor or return for chest pain, shortness of breath or stroke symptoms.  Please follow up as directed and return to the ER or see a physician for new or worsening symptoms.  Thank you. Filed Vitals:   11/09/14 1054 11/09/14 1111 11/09/14 1354 11/09/14 1400  BP: 112/69  107/40 112/59  Pulse: 75  70 86  Temp: 97.5 F (36.4 C)  97.9 F (36.6 C)   TempSrc: Oral  Oral   Resp: $Remo'17  18 18  'NgJuA$ SpO2: 98% 96% 95% 98%

## 2014-11-10 ENCOUNTER — Other Ambulatory Visit: Payer: Self-pay | Admitting: Internal Medicine

## 2014-11-11 LAB — URINE CULTURE: Colony Count: >=100000

## 2014-11-12 ENCOUNTER — Telehealth (HOSPITAL_BASED_OUTPATIENT_CLINIC_OR_DEPARTMENT_OTHER): Payer: Self-pay | Admitting: Emergency Medicine

## 2014-11-12 ENCOUNTER — Telehealth: Payer: Self-pay | Admitting: Pulmonary Disease

## 2014-11-12 DIAGNOSIS — E662 Morbid (severe) obesity with alveolar hypoventilation: Secondary | ICD-10-CM

## 2014-11-12 NOTE — Telephone Encounter (Signed)
Urine culture results faxed to [facility] per PharmD request @ [phone]

## 2014-11-12 NOTE — Telephone Encounter (Signed)
Order placed. Pt aware. Staff message sent to [facility]. Nothing further needed

## 2014-11-13 ENCOUNTER — Telehealth: Payer: Self-pay | Admitting: Pulmonary Disease

## 2014-11-13 NOTE — Telephone Encounter (Signed)
Spoke with Tamara Morales at [facility]. She didn't see the RA sats that states 83%. Nothing further needed at this time.

## 2014-11-16 ENCOUNTER — Encounter: Payer: Self-pay | Admitting: Family Medicine

## 2014-11-17 ENCOUNTER — Encounter: Payer: Self-pay | Admitting: Pulmonary Disease

## 2014-11-17 NOTE — Telephone Encounter (Signed)
Message     Dr. Halford Chessman,    I would like to know more about what causes this hypoxia. Do you think I should see a cardiologist?     Thanks    Dr. Halford Chessman please advise.  Thanks!

## 2014-11-22 ENCOUNTER — Encounter: Payer: Self-pay | Admitting: Pulmonary Disease

## 2014-11-22 DIAGNOSIS — R0600 Dyspnea, unspecified: Secondary | ICD-10-CM

## 2014-11-22 DIAGNOSIS — R0609 Other forms of dyspnea: Principal | ICD-10-CM

## 2014-11-23 NOTE — Telephone Encounter (Signed)
Okay to send order for this.

## 2014-11-23 NOTE — Telephone Encounter (Signed)
VS - are you okay with making this order? Thanks.

## 2014-11-24 ENCOUNTER — Other Ambulatory Visit: Payer: Self-pay | Admitting: Family Medicine

## 2014-11-27 ENCOUNTER — Ambulatory Visit (INDEPENDENT_AMBULATORY_CARE_PROVIDER_SITE_OTHER): Payer: Medicare Other | Admitting: Family Medicine

## 2014-11-27 ENCOUNTER — Encounter: Payer: Self-pay | Admitting: Family Medicine

## 2014-11-27 VITALS — BP 126/82 | HR 108 | Temp 97.6°F | Wt 306.0 lb

## 2014-11-27 DIAGNOSIS — R609 Edema, unspecified: Secondary | ICD-10-CM

## 2014-11-27 DIAGNOSIS — E662 Morbid (severe) obesity with alveolar hypoventilation: Secondary | ICD-10-CM

## 2014-11-27 NOTE — Assessment & Plan Note (Signed)
Discussed need for weight loss and which activities would qualify for exertion as she has been told should use o2 with exertion but still watiing on portable.

## 2014-11-27 NOTE — Progress Notes (Signed)
Garret Reddish, MD  Tamara Morales is a 81 y.o. year old very pleasant female patient who presents with:  ED visit 11/09/14 for bilateral extremity numbness and facial numbness that resolved within a few hours . CT head no CVA. Symptoms have not recurred. She reports she went in due to her HR. She has had no recurrence of symptoms  ED visit 10/04/14 for nausea and lower leg swelling, cough, SOB. "No evidence of CHF, negative troponin. Unremarkable EKG and CXR." CT angio negative for PE. Ended up seeing pulmonology- obesity hypoventilation as cause of desaturations per pulmonology. Poor control of this as has not lost weight  Today, we focused a lot of our efforts on need to lose weight in relations to obesity with poor control. She is Going to start a fat addiction program at [place]. She still suffers from Edema/venous insufficiency with poor control but cannot get compression stockings on due to level of obesity. Considered weight watchers previously but did not do. Admits to a lot of stress eating as noted below.   Also, we discussed caregiver burden as she tries to care for husband who has qualified for SNF but she has not placed him in yet. He has dementia and needs a lot of assistance.   Her HR usually around 100 if not on oxygen and goes below this when on oxygen but she is still waiting on portable container.   ROS- shortness of breath with activity more than just walking in office. No chest pain. No orthopnea or PND.   Past Medical History- morbid obesity, OSA< panic attacks, hypothyroidism, hyperlipidemia, hypertension  Medications- reviewed and updated Current Outpatient Prescriptions  Medication Sig Dispense Refill  . ALPRAZolam (XANAX) 0.25 MG tablet TAKE 1/2 to 1 TABLET BY MOUTH TWICE A DAY AS NEEDED FOR ANXIETY OR SLEEP (Patient not taking: Reported on 11/27/2014) 40 tablet 2  . aspirin 81 MG tablet Take 81 mg by mouth every morning.     [name] FIBER PO Take 2  tablets by mouth every morning.     . furosemide (LASIX) 40 MG tablet TAKE 1 TABLET BY MOUTH ONCE DAILY (Patient taking differently: TAKE 40 MG BY MOUTH ONCE DAILY) 90 tablet 1  . loratadine (CLARITIN) 10 MG tablet Take 10 mg by mouth daily as needed for allergies.     [name] nystatin (MYCOSTATIN/NYSTOP) 100000 UNIT/GM POWD APPLY TO AFFECTED AREA TWICE A DAY (Patient not taking: Reported on 11/27/2014) 120 g 2  . Omega-3 Fatty Acids (FISH OIL) 1200 MG CAPS Take 2,400 mg by mouth daily.     [name] omeprazole (PRILOSEC) 20 MG capsule TAKE 1 CAPSULE (20 MG TOTAL) BY MOUTH 2 (TWO) TIMES DAILY. 180 capsule 3  . ondansetron (ZOFRAN) 4 MG tablet Take 1 tablet (4 mg total) by mouth every 6 (six) hours. (Patient not taking: Reported on 11/09/2014) 12 tablet 0  . phenazopyridine (AZO-TABS) 95 MG tablet Take 95 mg by mouth daily.     . potassium chloride (MICRO-K) 10 MEQ CR capsule TAKE 2 CAPSULE BY MOUTH ONCE DAILY (Patient taking differently: Take 20 mEq by mouth daily. ) 90 capsule 3  . senna-docusate (SENOKOT-S) 8.6-50 MG per tablet Take 2 tablets by mouth at bedtime as needed for mild constipation. (Patient not taking: Reported on 11/27/2014) 30 tablet 1  . SYNTHROID 175 MCG tablet TAKE 1 TABLET BY MOUTH ONCE DAILY BEFORE BREAKFAST (Patient taking differently: TAKE 175 MCG BY MOUTH ONCE DAILY BEFORE BREAKFAST) 90 tablet 3  .  TRIBENZOR 20-5-12.5 MG TABS TAKE 1 TABLET BY MOUTH EVERY DAY 30 tablet 5   No current facility-administered medications for this visit.    Objective: BP 126/82 mmHg  Pulse 108  Temp(Src) 97.6 F (36.4 C)  Wt 306 lb (138.801 kg) Gen: NAD, resting comfortably CV: slightly tachy around 100, regular rhythm no murmurs rubs or gallops Lungs: CTAB no crackles, wheeze, rhonchi Abdomen: soft/nontender/nondistended/normal bowel sounds. No rebound or guarding. Morbidly obese.  Ext: 2+ pitting edema  Skin: venous stasis changes Neuro: grossly normal, moves all extremities. Walks with 2 canes to  support her weight   Assessment/Plan:  Morbid obesity Discussed role of stress eating and how caring for husband increases this. Encouraged patient to consider SNF placement for him as she is already doing. Agreed with fat addiction program at [facility] long and follow up. She has multiple issues obesity related- venous insufficiency, obesity hypoventilation, likely high resting HR due to sedentary activity and poor oxygenation and we discussed interplay.   Edema Venous stasis related- discussed weight loss need  Obesity hypoventilation syndrome Discussed need for weight loss and which activities would qualify for exertion as she has been told should use o2 with exertion but still watiing on portable.    >50% of 25 minute office visit was spent on counseling (need for weight loss, stressors of caring for husband, stress eating and considerations to avoid) and coordination of care

## 2014-11-27 NOTE — Assessment & Plan Note (Signed)
Discussed role of stress eating and how caring for husband increases this. Encouraged patient to consider SNF placement for him as she is already doing. Agreed with fat addiction program at [facility] long and follow up. She has multiple issues obesity related- venous insufficiency, obesity hypoventilation, likely high resting HR due to sedentary activity and poor oxygenation and we discussed interplay.

## 2014-11-27 NOTE — Patient Instructions (Signed)
BP looks great, check thyroid next visit, 4 months reasonable  Hope the new program can help you with weight loss  Suspicious HR will continue to be lower on oxygen and I would use with exertion as advised by Dr. Halford Chessman.

## 2014-11-27 NOTE — Assessment & Plan Note (Signed)
Venous stasis related- discussed weight loss need

## 2014-12-18 DIAGNOSIS — N390 Urinary tract infection, site not specified: Secondary | ICD-10-CM

## 2014-12-18 HISTORY — DX: Urinary tract infection, site not specified: N39.0

## 2014-12-21 ENCOUNTER — Encounter (HOSPITAL_COMMUNITY): Payer: Self-pay | Admitting: Emergency Medicine

## 2014-12-21 ENCOUNTER — Encounter: Payer: Self-pay | Admitting: Family Medicine

## 2014-12-21 ENCOUNTER — Emergency Department (HOSPITAL_COMMUNITY): Payer: Medicare Other

## 2014-12-21 ENCOUNTER — Emergency Department (HOSPITAL_COMMUNITY)
Admission: EM | Admit: 2014-12-21 | Discharge: 2014-12-21 | Disposition: A | Discharge status: Home / Self Care | Payer: Medicare Other | Attending: Emergency Medicine | Admitting: Emergency Medicine

## 2014-12-21 DIAGNOSIS — K219 Gastro-esophageal reflux disease without esophagitis: Secondary | ICD-10-CM | POA: Diagnosis not present

## 2014-12-21 DIAGNOSIS — F329 Major depressive disorder, single episode, unspecified: Secondary | ICD-10-CM | POA: Insufficient documentation

## 2014-12-21 DIAGNOSIS — Z853 Personal history of malignant neoplasm of breast: Secondary | ICD-10-CM | POA: Insufficient documentation

## 2014-12-21 DIAGNOSIS — E039 Hypothyroidism, unspecified: Secondary | ICD-10-CM | POA: Diagnosis not present

## 2014-12-21 DIAGNOSIS — I10 Essential (primary) hypertension: Secondary | ICD-10-CM | POA: Insufficient documentation

## 2014-12-21 DIAGNOSIS — Z87448 Personal history of other diseases of urinary system: Secondary | ICD-10-CM | POA: Insufficient documentation

## 2014-12-21 DIAGNOSIS — E785 Hyperlipidemia, unspecified: Secondary | ICD-10-CM | POA: Diagnosis not present

## 2014-12-21 DIAGNOSIS — R600 Localized edema: Secondary | ICD-10-CM | POA: Diagnosis not present

## 2014-12-21 DIAGNOSIS — Z7982 Long term (current) use of aspirin: Secondary | ICD-10-CM | POA: Insufficient documentation

## 2014-12-21 DIAGNOSIS — R609 Edema, unspecified: Secondary | ICD-10-CM | POA: Diagnosis not present

## 2014-12-21 DIAGNOSIS — Z9981 Dependence on supplemental oxygen: Secondary | ICD-10-CM | POA: Insufficient documentation

## 2014-12-21 DIAGNOSIS — R079 Chest pain, unspecified: Secondary | ICD-10-CM | POA: Diagnosis not present

## 2014-12-21 DIAGNOSIS — R05 Cough: Secondary | ICD-10-CM | POA: Diagnosis not present

## 2014-12-21 DIAGNOSIS — R0602 Shortness of breath: Secondary | ICD-10-CM | POA: Diagnosis not present

## 2014-12-21 DIAGNOSIS — M129 Arthropathy, unspecified: Secondary | ICD-10-CM | POA: Diagnosis not present

## 2014-12-21 DIAGNOSIS — Z9851 Tubal ligation status: Secondary | ICD-10-CM | POA: Insufficient documentation

## 2014-12-21 DIAGNOSIS — Z9049 Acquired absence of other specified parts of digestive tract: Secondary | ICD-10-CM | POA: Insufficient documentation

## 2014-12-21 DIAGNOSIS — Z79899 Other long term (current) drug therapy: Secondary | ICD-10-CM | POA: Insufficient documentation

## 2014-12-21 DIAGNOSIS — Z8669 Personal history of other diseases of the nervous system and sense organs: Secondary | ICD-10-CM | POA: Insufficient documentation

## 2014-12-21 DIAGNOSIS — Z87891 Personal history of nicotine dependence: Secondary | ICD-10-CM | POA: Insufficient documentation

## 2014-12-21 LAB — CBC
HEMATOCRIT: 39.5 % (ref 36.0–46.0)
Hemoglobin: 13.4 g/dL (ref 12.0–15.0)
MCH: 30.6 pg (ref 26.0–34.0)
MCHC: 33.9 g/dL (ref 30.0–36.0)
MCV: 90.2 fL (ref 78.0–100.0)
Platelets: 163 10*3/uL (ref 150–400)
RBC: 4.38 MIL/uL (ref 3.87–5.11)
RDW: 14.3 % (ref 11.5–15.5)
WBC: 7.2 10*3/uL (ref 4.0–10.5)

## 2014-12-21 LAB — COMPREHENSIVE METABOLIC PANEL
ALBUMIN: 3.2 g/dL — AB (ref 3.5–5.0)
ALK PHOS: 81 U/L (ref 38–126)
ALT: 15 U/L (ref 14–54)
AST: 19 U/L (ref 15–41)
Anion gap: 9 (ref 5–15)
BILIRUBIN TOTAL: 0.8 mg/dL (ref 0.3–1.2)
BUN: 11 mg/dL (ref 6–20)
CALCIUM: 8.3 mg/dL — AB (ref 8.9–10.3)
CO2: 28 mmol/L (ref 22–32)
Chloride: 101 mmol/L (ref 101–111)
Creatinine, Ser: 0.9 mg/dL (ref 0.44–1.00)
GFR calc non Af Amer: 58 mL/min — ABNORMAL LOW (ref >60)
Glucose, Bld: 122 mg/dL — ABNORMAL HIGH (ref 65–99)
Potassium: 3.6 mmol/L (ref 3.5–5.1)
Sodium: 138 mmol/L (ref 135–145)
Total Protein: 6.1 g/dL — ABNORMAL LOW (ref 6.5–8.1)

## 2014-12-21 LAB — URINALYSIS, ROUTINE W REFLEX MICROSCOPIC
Bilirubin Urine: NEGATIVE
Glucose, UA: NEGATIVE mg/dL
Hgb urine dipstick: NEGATIVE
Ketones, ur: NEGATIVE mg/dL
Nitrite: NEGATIVE
PROTEIN: NEGATIVE mg/dL
Specific Gravity, Urine: 1.007 (ref 1.005–1.030)
UROBILINOGEN UA: 0.2 mg/dL (ref 0.0–1.0)
pH: 5.5 (ref 5.0–8.0)

## 2014-12-21 LAB — URINE MICROSCOPIC-ADD ON

## 2014-12-21 LAB — BRAIN NATRIURETIC PEPTIDE: B NATRIURETIC PEPTIDE 5: 74.2 pg/mL (ref 0.0–100.0)

## 2014-12-21 LAB — I-STAT TROPONIN, ED: Troponin i, poc: 0.01 ng/mL (ref 0.00–0.08)

## 2014-12-21 NOTE — Discharge Instructions (Signed)
Use Maalox before meals and at bedtime, and as needed for symptoms of heartburn.   Esophagitis Esophagitis is inflammation of the esophagus. It can involve swelling, soreness, and pain in the esophagus. This condition can make it difficult and painful to swallow. CAUSES  Most causes of esophagitis are not serious. Many different factors can cause esophagitis, including:  Gastroesophageal reflux disease (GERD). This is when acid from your stomach flows up into the esophagus.  Recurrent vomiting.  An allergic-type reaction.  Certain medicines, especially those that come in large pills.  Ingestion of harmful chemicals, such as household cleaning products.  Heavy alcohol use.  An infection of the esophagus.  Radiation treatment for cancer.  Certain diseases such as sarcoidosis, Crohn's disease, and scleroderma. These diseases may cause recurrent esophagitis. SYMPTOMS   Trouble swallowing.  Painful swallowing.  Chest pain.  Difficulty breathing.  Nausea.  Vomiting.  Abdominal pain. DIAGNOSIS  Your caregiver will take your history and do a physical exam. Depending upon what your caregiver finds, certain tests may also be done, including:  Barium X-ray. You will drink a solution that coats the esophagus, and X-rays will be taken.  Endoscopy. A lighted tube is put down the esophagus so your caregiver can examine the area.  Allergy tests. These can sometimes be arranged through follow-up visits. TREATMENT  Treatment will depend on the cause of your esophagitis. In some cases, steroids or other medicines may be given to help relieve your symptoms or to treat the underlying cause of your condition. Medicines that may be recommended include:  Viscous lidocaine, to soothe the esophagus.  Antacids.  Acid reducers.  Proton pump inhibitors.  Antiviral medicines for certain viral infections of the esophagus.  Antifungal medicines for certain fungal infections of the  esophagus.  Antibiotic medicines, depending on the cause of the esophagitis. HOME CARE INSTRUCTIONS   Avoid foods and drinks that seem to make your symptoms worse.  Eat small, frequent meals instead of large meals.  Avoid eating for the 3 hours prior to your bedtime.  If you have trouble taking pills, use a pill splitter to decrease the size and likelihood of the pill getting stuck or injuring the esophagus on the way down. Drinking water after taking a pill also helps.  Stop smoking if you smoke.  Maintain a healthy weight.  Wear loose-fitting clothing. Do not wear anything tight around your waist that causes pressure on your stomach.  Raise the head of your bed 6 to 8 inches with wood blocks to help you sleep. Extra pillows will not help.  Only take over-the-counter or prescription medicines as directed by your caregiver. SEEK IMMEDIATE MEDICAL CARE IF:  You have severe chest pain that radiates into your arm, neck, or jaw.  You feel sweaty, dizzy, or lightheaded.  You have shortness of breath.  You vomit blood.  You have difficulty or pain with swallowing.  You have bloody or black, tarry stools.  You have a fever.  You have a burning sensation in the chest more than 3 times a week for more than 2 weeks.  You cannot swallow, drink, or eat.  You drool because you cannot swallow your saliva. MAKE SURE YOU:  Understand these instructions.  Will watch your condition.  Will get help right away if you are not doing well or get worse. Document Released: 07/13/2004 Document Revised: 08/28/2011 Document Reviewed: 02/03/2011 [facility] Patient Information 2015 [place], [place]. This information is not intended to replace advice given to you by your  health care provider. Make sure you discuss any questions you have with your health care provider.  Edema Edema is an abnormal buildup of fluids in your bodytissues. Edema is somewhatdependent on gravity to pull the fluid to  the lowest place in your body. That makes the condition more common in the legs and thighs (lower extremities). Painless swelling of the feet and ankles is common and becomes more likely as you get older. It is also common in looser tissues, like around your eyes.  When the affected area is squeezed, the fluid may move out of that spot and leave a dent for a few moments. This dent is called pitting.  CAUSES  There are many possible causes of edema. Eating too much salt and being on your feet or sitting for a long time can cause edema in your legs and ankles. Hot weather may make edema worse. Common medical causes of edema include:  Heart failure.  Liver disease.  Kidney disease.  Weak blood vessels in your legs.  Cancer.  An injury.  Pregnancy.  Some medications.  Obesity. SYMPTOMS  Edema is usually painless.Your skin may look swollen or shiny.  DIAGNOSIS  Your health care provider may be able to diagnose edema by asking about your medical history and doing a physical exam. You may need to have tests such as X-rays, an electrocardiogram, or blood tests to check for medical conditions that may cause edema.  TREATMENT  Edema treatment depends on the cause. If you have heart, liver, or kidney disease, you need the treatment appropriate for these conditions. General treatment may include:  Elevation of the affected body part above the level of your heart.  Compression of the affected body part. Pressure from elastic bandages or support stockings squeezes the tissues and forces fluid back into the blood vessels. This keeps fluid from entering the tissues.  Restriction of fluid and salt intake.  Use of a water pill (diuretic). These medications are appropriate only for some types of edema. They pull fluid out of your body and make you urinate more often. This gets rid of fluid and reduces swelling, but diuretics can have side effects. Only use diuretics as directed by your health care  provider. HOME CARE INSTRUCTIONS   Keep the affected body part above the level of your heart when you are lying down.   Do not sit still or stand for prolonged periods.   Do not put anything directly under your knees when lying down.  Do not wear constricting clothing or garters on your upper legs.   Exercise your legs to work the fluid back into your blood vessels. This may help the swelling go down.   Wear elastic bandages or support stockings to reduce ankle swelling as directed by your health care provider.   Eat a low-salt diet to reduce fluid if your health care provider recommends it.   Only take medicines as directed by your health care provider. SEEK MEDICAL CARE IF:   Your edema is not responding to treatment.  You have heart, liver, or kidney disease and notice symptoms of edema.  You have edema in your legs that does not improve after elevating them.   You have sudden and unexplained weight gain. SEEK IMMEDIATE MEDICAL CARE IF:   You develop shortness of breath or chest pain.   You cannot breathe when you lie down.  You develop pain, redness, or warmth in the swollen areas.   You have heart, liver, or kidney disease  and suddenly get edema.  You have a fever and your symptoms suddenly get worse. MAKE SURE YOU:   Understand these instructions.  Will watch your condition.  Will get help right away if you are not doing well or get worse. Document Released: 06/05/2005 Document Revised: 10/20/2013 Document Reviewed: 03/28/2013 [facility] Patient Information 2015 [place], [place]. This information is not intended to replace advice given to you by your health care provider. Make sure you discuss any questions you have with your health care provider.

## 2014-12-21 NOTE — ED Notes (Signed)
o2 dropping so I put her on 2l. Says she wears o2 at night when laying down.

## 2014-12-21 NOTE — ED Provider Notes (Signed)
CSN: [id number]     Arrival date & time 12/21/14  0918 History   First MD Initiated Contact with Patient 12/21/14 [phone]     Chief Complaint  Patient presents with  . Chest Pain     (Consider location/radiation/quality/duration/timing/severity/associated sxs/prior Treatment) HPI   Tamara Morales is a 81 y.o. female who presents for an episode of chest discomfort which started as a swelling sensation in her central chest, got gradually worse, then resolved to a feeling of tightness. She had an associated nauseated feeling. She was transferred here by EMS, who treated her with routine chest pain medications, aspirin, nitroglycerin and Zofran. She reports having increased stress and anxiety recently because her husband has been placed in a higher level of care. She has chronic reflux disease, and takes Prilosec, twice a day. She does not typically use antiacids. She denies cough, fever, chills, vomiting, diarrhea, or shortness of breath. There are no other known modifying factors.   Past Medical History  Diagnosis Date  . Morbid obesity   . MIGRAINE HEADACHE   . ARTHRITIS, KNEES, BILATERAL   . DEPRESSION   . GERD   . HYPERLIPIDEMIA   . HYPERTENSION   . Breast CA 2000    s/p R lumpectomy and XRT  . DJD (degenerative joint disease) of knee   . Urge incontinence   . HYPOTHYROIDISM     postsurgical  . OSA (obstructive sleep apnea) 01/17/2011 dx  . Hypoxia   . Oxygen dependent    Past Surgical History  Procedure Laterality Date  . Cholecystectomy  1995  . Breast lumpectomy  2000  . Thyroidectomy    . Breast biopsy  2000  . Replacement total knee bilateral  1999, 2005  . Tubal ligation    . Parathyroidectomy      2-3 removed  . Tonsillectomy    . Adenoidectomy    . Right leg femur repaired     Family History  Problem Relation Age of Onset  . Emphysema Sister   . Coronary artery disease Neg Hx    History  Substance Use Topics  . Smoking status: Former Smoker -- 0.25  packs/day for 2 years    Types: Cigarettes    Quit date: 06/19/1974  . Smokeless tobacco: Never Used     Comment: Quit in late 20's  . Alcohol Use: 0.0 oz/week    0 Standard drinks or equivalent per week     Comment: rare   OB History    Gravida Para Term Preterm AB TAB SAB Ectopic Multiple Living   5 5             Review of Systems  All other systems reviewed and are negative.     Allergies  Azithromycin; Beta adrenergic blockers; Ciprofloxacin; Codeine; Epinephrine; Klonopin; Oxycodone; Paxil; Prednisone; Propoxyphene hcl; and Meloxicam  Home Medications   Prior to Admission medications   Medication Sig Start Date End Date Taking? Authorizing Provider  ALPRAZolam (XANAX) 0.25 MG tablet TAKE 1/2 to 1 TABLET BY MOUTH TWICE A DAY AS NEEDED FOR ANXIETY OR SLEEP 10/07/14  Yes Marin Olp, MD  aspirin 81 MG tablet Take 81 mg by mouth every morning.    Yes Historical Provider, MD  citalopram (CELEXA) 10 MG tablet Take 10 mg by mouth daily as needed (ANXIETY).   Yes Historical Provider, MD  FIBER PO Take 2 tablets by mouth every morning.    Yes Historical Provider, MD  furosemide (LASIX) 40 MG tablet TAKE 1  TABLET BY MOUTH ONCE DAILY Patient taking differently: TAKE 40 MG BY MOUTH ONCE DAILY 10/21/14  Yes Marin Olp, MD  loratadine (CLARITIN) 10 MG tablet Take 10 mg by mouth daily as needed for allergies.    Yes Historical Provider, MD  nystatin (MYCOSTATIN/NYSTOP) 100000 UNIT/GM POWD APPLY TO AFFECTED AREA TWICE A DAY Patient taking differently: APPLY TO AFFECTED AREA TWICE A DAY AS NEEDED 11/25/14  Yes Marin Olp, MD  Omega-3 Fatty Acids (FISH OIL) 1200 MG CAPS Take 2,400 mg by mouth daily.    Yes Historical Provider, MD  omeprazole (PRILOSEC) 20 MG capsule TAKE 1 CAPSULE (20 MG TOTAL) BY MOUTH 2 (TWO) TIMES DAILY. 11/06/14  Yes Marin Olp, MD  phenazopyridine (AZO-TABS) 95 MG tablet Take 95 mg by mouth daily.    Yes Historical Provider, MD  potassium chloride  (MICRO-K) 10 MEQ CR capsule TAKE 2 CAPSULE BY MOUTH ONCE DAILY Patient taking differently: Take 20 mEq by mouth daily.  09/28/14  Yes Marin Olp, MD  SYNTHROID 175 MCG tablet TAKE 1 TABLET BY MOUTH ONCE DAILY BEFORE BREAKFAST Patient taking differently: TAKE 175 MCG BY MOUTH ONCE DAILY BEFORE BREAKFAST 11/06/14  Yes Marin Olp, MD  TRIBENZOR 20-5-12.5 MG TABS TAKE 1 TABLET BY MOUTH EVERY DAY 11/10/14  Yes Marin Olp, MD  senna-docusate (SENOKOT-S) 8.6-50 MG per tablet Take 2 tablets by mouth at bedtime as needed for mild constipation. Patient not taking: Reported on 11/27/2014 08/29/13   Rowe Clack, MD   BP 109/63 mmHg  Pulse 70  Temp(Src) 98 F (36.7 C)  Resp 17  SpO2 93% Physical Exam  Constitutional: She is oriented to person, place, and time. She appears well-developed.  Elderly, obese  HENT:  Head: Normocephalic and atraumatic.  Right Ear: External ear normal.  Left Ear: External ear normal.  Eyes: Conjunctivae and EOM are normal. Pupils are equal, round, and reactive to light.  Neck: Normal range of motion and phonation normal. Neck supple.  Cardiovascular: Normal rate, regular rhythm and normal heart sounds.   Pulmonary/Chest: Effort normal and breath sounds normal. No respiratory distress. She exhibits no tenderness and no bony tenderness.  Abdominal: Soft. There is no tenderness.  Musculoskeletal: Normal range of motion. She exhibits edema (3+ lower legs bilaterally.). She exhibits no tenderness.  Neurological: She is alert and oriented to person, place, and time. No cranial nerve deficit or sensory deficit. She exhibits normal muscle tone. Coordination normal.  Skin: Skin is warm, dry and intact.  Psychiatric: She has a normal mood and affect. Her behavior is normal. Judgment and thought content normal.  Nursing note and vitals reviewed.   ED Course  Procedures (including critical care time) Patient presents with noncardiac chest pain, and negative  EKG. This pain and resulting discomfort likely is related to the GI tract.  Medications - No data to display  Patient Vitals for the past 24 hrs:  BP Temp Pulse Resp SpO2  12/21/14 1300 109/63 mmHg - 70 17 93 %  12/21/14 1200 122/66 mmHg - 70 17 95 %  12/21/14 1123 119/64 mmHg - 76 20 92 %  12/21/14 0930 135/74 mmHg 98 F (36.7 C) - 16 90 %    1:07 PM Reevaluation with update and discussion. After initial assessment and treatment, an updated evaluation reveals no change in clinical status. She remains comfortable. Findings discussed with patient, and daughter, all questions answered. Teresita Fanton L    Labs Review Labs Reviewed  COMPREHENSIVE METABOLIC PANEL -  Abnormal; Notable for the following:    Glucose, Bld 122 (*)    Calcium 8.3 (*)    Total Protein 6.1 (*)    Albumin 3.2 (*)    GFR calc non Af Amer 58 (*)    All other components within normal limits  URINALYSIS, ROUTINE W REFLEX MICROSCOPIC (NOT AT [facility]) - Abnormal; Notable for the following:    Leukocytes, UA TRACE (*)    All other components within normal limits  URINE MICROSCOPIC-ADD ON - Abnormal; Notable for the following:    Bacteria, UA MANY (*)    All other components within normal limits  URINE CULTURE  CBC  BRAIN NATRIURETIC PEPTIDE  I-STAT TROPOININ, ED    Imaging Review Dg Chest 2 View  12/21/2014   CLINICAL DATA:  Mid chest pain beginning last night. Chest tightness, shortness of breath, cough. Hypoxia.  EXAM: CHEST  2 VIEW  COMPARISON:  11/03/2014  FINDINGS: Heart is borderline in size. Chronic increased markings throughout the lungs, likely scarring. No acute airspace opacities or effusions. No acute bony abnormality. Old bilateral rib fractures noted, stable.  IMPRESSION: No active disease.   Electronically Signed   By: Rolm Baptise M.D.   On: 12/21/2014 10:28     EKG Interpretation   Date/Time:  Monday December 21 2014 09:28:31 EDT Ventricular Rate:  89 PR Interval:  177 QRS Duration: 95 QT  Interval:  362 QTC Calculation: 440 R Axis:   23 Text Interpretation:  Sinus rhythm Probable left atrial enlargement since  last tracing no significant change Confirmed by Eulis Foster  MD, Vira Agar ([id number])  on 12/21/2014 9:42:40 AM      MDM   Final diagnoses:  Gastroesophageal reflux disease, esophagitis presence not specified  Peripheral edema    Cardiac chest pain, with signs and symptoms of gastroesophageal reflux disease. Doubt ACS, PE, pneumonia, metabolic instability or impending vascular collapse.  Nursing Notes Reviewed/ Care Coordinated Applicable Imaging Reviewed Interpretation of Laboratory Data incorporated into ED treatment  The patient appears reasonably screened and/or stabilized for discharge and I doubt any other medical condition or other [facility] requiring further screening, evaluation, or treatment in the ED at this time prior to discharge.  Plan: Home Medications- Antacid prn; Home Treatments- rest, elevate legs; return here if the recommended treatment, does not improve the symptoms; Recommended follow up- PCP 1 week     Daleen Bo, MD 12/21/14 1308

## 2014-12-21 NOTE — ED Notes (Signed)
Pt arrived from [facility] assisted living with c/o substernal CP that started last night. Pt stated to EMS that about 8p last night pt had a pain in the substernal region that felt like a balloon expanding that lasted approximately 30 seconds and then throughout the night and had some chest tightness that was intermitted. Pt had some nausea and diaphoresis throughout the night. EMS administered ASA $RemoveBeforeDE'324mg'[id number]$ , Nitro x 1 and Zofran $RemoveBe'4mg'[id number]$  IM. Pt is now nausea free, and denies any CP at this time. Pt also has a husband that was moved to the memory care section at [facility] 3 days ago and that has caused some anxiety as well.

## 2014-12-22 ENCOUNTER — Telehealth: Payer: Self-pay | Admitting: *Deleted

## 2014-12-22 NOTE — Telephone Encounter (Signed)
Patient was seen in the ED   ---------------------------------------------------------------------------------------------------------------------------------------------------------------------------------------------------------------------------------- PLEASE NOTE: All timestamps contained within this report are represented as [place] Standard Time. CONFIDENTIALTY NOTICE: This fax transmission is intended only for the addressee. It contains information that is legally privileged, confidential or otherwise protected from use or disclosure. If you are not the intended recipient, you are strictly prohibited from reviewing, disclosing, copying using or disseminating any of this information or taking any action in reliance on or regarding this information. If you have received this fax in error, please notify us immediately by telephone so that we can arrange for its return to [place]. Phone: [phone], Toll-Free: [phone], Fax: [phone] Page: 1 of 2 Call Id: [id number] [facility] Night - Client [place] Patient Name: Tamara Morales Gender: Female DOB: 06-12-1931 Age: 81 Y 3 M 12 D Return Phone Number: [phone] (Primary) Address: [place] City/State/Zip: [place] [address] Client [facility] Night - Client Client Site [facility] - Night Physician Garret Reddish Contact Type Call Call Type Triage / Clinical Relationship To Patient Self Return Phone Number [phone] (Primary) Chief Complaint CHEST PAIN (>=21 years) - pain, pressure, heaviness or tightness Initial Comment Caller States nausea, fever, chest tightness PreDisposition Did not know what to do Nurse Assessment Nurse: Julien Girt, RN, Almyra Free Date/Time Eilene Ghazi Time): 12/21/2014 8:08:26 AM Confirm and document reason for call. If symptomatic, describe symptoms. ---Caller  states she is nauseated has chest tightness, and feels "hot" States the chest tightness began at 8 pm, kept her awake during the night. Has the patient traveled out of the country within the last 30 days? ---Not Applicable Does the patient require triage? ---Yes Related visit to physician within the last 2 weeks? ---No Does the PT have any chronic conditions? (i.e. diabetes, asthma, etc.) ---Yes List chronic conditions. ---Hx hypoxia, uses nasal 02, htn Guidelines Guideline Title Affirmed Question Affirmed Notes Nurse Date/Time Eilene Ghazi Time) Chest Pain [1] Chest pain lasts > 5 minutes AND [2] history of heart disease (i.e., heart attack, bypass surgery, angina, angioplasty, CHF; not just a heart murmur) Chancy Hurter 12/21/2014 8:11:04 AM Disp. Time Eilene Ghazi Time) Disposition Final User 12/21/2014 8:07:14 AM Send to Urgent Queue Almon Register 12/21/2014 8:24:11 AM 911 Outcome Documentation Julien Girt, RN, Almyra Free Reason: Caller states EMS are with her now. 12/21/2014 8:12:23 AM Call EMS 911 Now Yes Julien Girt, RN, Sheilah Mins NOTE: All timestamps contained within this report are represented as [place] Standard Time. CONFIDENTIALTY NOTICE: This fax transmission is intended only for the addressee. It contains information that is legally privileged, confidential or otherwise protected from use or disclosure. If you are not the intended recipient, you are strictly prohibited from reviewing, disclosing, copying using or disseminating any of this information or taking any action in reliance on or regarding this information. If you have received this fax in error, please notify us immediately by telephone so that we can arrange for its return to [place]. Phone: [phone], Toll-Free: [phone], Fax: [phone] Page: 2 of 2 Call Id: [id number] Caller Understands: Yes Disagree/Comply: Comply Care Advice Given Per Guideline CALL EMS 911 NOW: Immediate medical attention is needed. You need to hang up  and call 911 (or an ambulance). Psychologist, forensic Discretion: I'll call you back in a few minutes to be sure you were able to reach them.) CARE ADVICE given per Chest Pain (Adult) guideline. After Care Instructions Given Call Event Type User Date / Time Description

## 2014-12-23 DIAGNOSIS — H43813 Vitreous degeneration, bilateral: Secondary | ICD-10-CM | POA: Diagnosis not present

## 2014-12-23 DIAGNOSIS — Z961 Presence of intraocular lens: Secondary | ICD-10-CM | POA: Diagnosis not present

## 2014-12-23 DIAGNOSIS — H26493 Other secondary cataract, bilateral: Secondary | ICD-10-CM | POA: Diagnosis not present

## 2014-12-23 LAB — URINE CULTURE
Culture: >=100000
Special Requests: NORMAL

## 2014-12-24 ENCOUNTER — Telehealth (HOSPITAL_BASED_OUTPATIENT_CLINIC_OR_DEPARTMENT_OTHER): Payer: Self-pay | Admitting: Emergency Medicine

## 2014-12-24 NOTE — Progress Notes (Signed)
ED Antimicrobial Stewardship Positive Culture Follow Up   Tamara Morales is an 81 y.o. female who presented to [facility] on 12/21/2014 with a chief complaint of  Chief Complaint  Patient presents with  . Chest Pain    Recent Results (from the past 720 hour(s))  Urine culture     Status: None   Collection Time: 12/21/14 11:19 AM  Result Value Ref Range Status   Specimen Description URINE, CLEAN CATCH  Final   Special Requests Normal  Final   Culture >=100,000 COLONIES/mL ESCHERICHIA COLI  Final   Report Status 12/23/2014 FINAL  Final   Organism ID, Bacteria ESCHERICHIA COLI  Final      Susceptibility   Escherichia coli - MIC*    AMPICILLIN 4 SENSITIVE Sensitive     CEFAZOLIN <=4 SENSITIVE Sensitive     CEFTRIAXONE <=1 SENSITIVE Sensitive     CIPROFLOXACIN >=4 RESISTANT Resistant     GENTAMICIN <=1 SENSITIVE Sensitive     IMIPENEM <=0.25 SENSITIVE Sensitive     NITROFURANTOIN <=16 SENSITIVE Sensitive     TRIMETH/SULFA <=20 SENSITIVE Sensitive     AMPICILLIN/SULBACTAM <=2 SENSITIVE Sensitive     PIP/TAZO <=4 SENSITIVE Sensitive     * >=100,000 COLONIES/mL ESCHERICHIA COLI    Patient with positive culture, however no urinary symptoms. No antibiotic treatment is indicated at this time  ED Provider: Okey Regal, PA-C  Wynell Balloon 12/24/2014, 8:20 AM Infectious Diseases Pharmacist Phone# [phone]

## 2014-12-24 NOTE — Telephone Encounter (Signed)
Post ED Visit - Positive Culture Follow-up  Culture report reviewed by antimicrobial stewardship pharmacist: []  Wes [place], Pharm.D., BCPS []  Heide Guile, Pharm.D., BCPS []  Alycia Rossetti, Pharm.D., BCPS []  [place], Pharm.D., BCPS, AAHIVP []  Legrand Como, Pharm.D., BCPS, AAHIVP []  Isac Sarna, Pharm.D., BCPS [place] PharmD  Positive urine culture E. coli Treated with none,asymptomatic and no further patient follow-up is required at this time.  Hazle Nordmann 12/24/2014, 8:25 AM

## 2014-12-28 ENCOUNTER — Ambulatory Visit (INDEPENDENT_AMBULATORY_CARE_PROVIDER_SITE_OTHER): Payer: Medicare Other | Admitting: Family Medicine

## 2014-12-28 ENCOUNTER — Encounter: Payer: Self-pay | Admitting: Family Medicine

## 2014-12-28 VITALS — BP 130/76 | HR 92 | Temp 97.9°F | Wt 306.0 lb

## 2014-12-28 DIAGNOSIS — R0789 Other chest pain: Secondary | ICD-10-CM | POA: Diagnosis not present

## 2014-12-28 DIAGNOSIS — E890 Postprocedural hypothyroidism: Secondary | ICD-10-CM | POA: Diagnosis not present

## 2014-12-28 LAB — TSH: TSH: 7.51 u[IU]/mL — ABNORMAL HIGH (ref 0.35–4.50)

## 2014-12-28 MED ORDER — LEVOTHYROXINE SODIUM 200 MCG PO TABS: ORAL_TABLET | ORAL | Status: DC

## 2014-12-28 NOTE — Assessment & Plan Note (Signed)
Patient with multiple risk factors for cardiac origin of pain. Reports normal stress test 9-97 years ago but certainly could have developed CAD> Advised stress testing (she had similar advisement a year ago for different reason and did not complete). Patient declined, she is aware of risk that this could be cardiac and lead to death. On the other hand, this is likely reflux related given resolved with mylanta at home and then stayed away when restarting BID prilosec (she had cut back to Am dosing and started with chest pain in evening after dinner). She agrees if recurrent to have stress test done.

## 2014-12-28 NOTE — Patient Instructions (Signed)
Check in on thyroid today  We discussed there is a risk this could have been caused by your heart (the chest pain) but most likely this is related to your reflux given improvement with mylanta and going back to twice a day prilosec. You opted not to pursue cardiac workup despite the risks but agreed if any recurrence we would pursue this through stress testing  See you in 4-6 months

## 2014-12-28 NOTE — Progress Notes (Signed)
Garret Reddish, MD  Subjective:  Tamara Morales is a 81 y.o. year old very pleasant female patient who presents with:  ED follow up for chest pain Also, See problem oriented charting -Seen in Ed for chest pain on 12/21/14. Started suddenly around 8 pm in middle of chest, felt like it would expanding aching then disappear in 15 seconds. Had them every 15 or 20 minutes lasted until next AM when she went to AM.  She was already on prilosec at that time for known GERD but had stopped BID dosing in recent months. i stat troponin was 0.01 many hours after pain began.other labs unremarkable. No acute EKG changes. CXR was normal. Advised PCP folloow up. 8-10 years ago was told she had a normal catheterizations  Liquid mylanta relieved pain when she went home.Patient states she started taking her prilosec again in the evening and pain has not recurred since doing so. No recurrent issues since starting back the evening dose. Husband in skilled nursing as of last week and that has helped stress level.   ROS- denies nausea, vomiting, shortness of breath above baseline, diaphoresis, abnormal fatigue  Past Medical History- obesity hypoventilation syndrome, depression, HTN, hx breast cancer, hyperglycemia, HLD, hypothyroidism  Medications- reviewed and updated Current Outpatient Prescriptions  Medication Sig Dispense Refill  . aspirin 81 MG tablet Take 81 mg by mouth every morning.     [name] FIBER PO Take 2 tablets by mouth every morning.     . furosemide (LASIX) 40 MG tablet TAKE 1 TABLET BY MOUTH ONCE DAILY (Patient taking differently: TAKE 40 MG BY MOUTH ONCE DAILY) 90 tablet 1  . loratadine (CLARITIN) 10 MG tablet Take 10 mg by mouth daily as needed for allergies.     [name] nystatin (MYCOSTATIN/NYSTOP) 100000 UNIT/GM POWD APPLY TO AFFECTED AREA TWICE A DAY (Patient taking differently: APPLY TO AFFECTED AREA TWICE A DAY AS NEEDED) 120 g 2  . Omega-3 Fatty Acids (FISH OIL) 1200 MG CAPS Take 2,400 mg by mouth  daily.     [name] omeprazole (PRILOSEC) 20 MG capsule TAKE 1 CAPSULE (20 MG TOTAL) BY MOUTH 2 (TWO) TIMES DAILY. 180 capsule 3  . phenazopyridine (AZO-TABS) 95 MG tablet Take 95 mg by mouth daily.     . potassium chloride (MICRO-K) 10 MEQ CR capsule TAKE 2 CAPSULE BY MOUTH ONCE DAILY (Patient taking differently: Take 20 mEq by mouth daily. ) 90 capsule 3  . SYNTHROID 175 MCG tablet TAKE 1 TABLET BY MOUTH ONCE DAILY BEFORE BREAKFAST (Patient taking differently: TAKE 175 MCG BY MOUTH ONCE DAILY BEFORE BREAKFAST) 90 tablet 3  . TRIBENZOR 20-5-12.5 MG TABS TAKE 1 TABLET BY MOUTH EVERY DAY 30 tablet 5  . ALPRAZolam (XANAX) 0.25 MG tablet TAKE 1/2 to 1 TABLET BY MOUTH TWICE A DAY AS NEEDED FOR ANXIETY OR SLEEP (Patient not taking: Reported on 12/28/2014) 40 tablet 2  . citalopram (CELEXA) 10 MG tablet Take 10 mg by mouth daily as needed (ANXIETY).    [name] senna-docusate (SENOKOT-S) 8.6-50 MG per tablet Take 2 tablets by mouth at bedtime as needed for mild constipation. (Patient not taking: Reported on 12/28/2014) 30 tablet 1   Objective: BP 130/76 mmHg  Pulse 92  Temp(Src) 97.9 F (36.6 C)  Wt 306 lb (138.801 kg) Gen: NAD, resting comfortably, walks with 2 canes for support CV: RRR no murmurs rubs or gallops Lungs: CTAB no crackles, wheeze, rhonchi Abdomen: soft/nontender/nondistended/normal bowel sounds. Morbidly obese.  Ext: 2+ pitting edema Skin: warm, dry, no  rash   Neuro: grossly normal, moves all extremities  Assessment/Plan:  Atypical chest pain Patient with multiple risk factors for cardiac origin of pain. Reports normal stress test 1-74 years ago but certainly could have developed CAD> Advised stress testing (she had similar advisement a year ago for different reason and did not complete). Patient declined, she is aware of risk that this could be cardiac and lead to death. On the other hand, this is likely reflux related given resolved with mylanta at home and then stayed away when restarting BID  prilosec (she had cut back to Am dosing and started with chest pain in evening after dinner). She agrees if recurrent to have stress test done.   Hypothyroidism S: Asymptomatic except difficulty losing weight Lab Results  Component Value Date   TSH 7.51* 12/28/2014  A/P: TSH remains elevated. Increase synthroid to 240mcg and follow up in 6 weeks with repeat lab.    6 week lab visit. Follow up in person 3-4 months.   Orders Placed This Encounter  Procedures  . TSH    [place]  . TSH    [place]    Standing Status: Future     Number of Occurrences:      Standing Expiration Date: 12/28/2015    Meds ordered this encounter  Medications  . levothyroxine (SYNTHROID, LEVOTHROID) 200 MCG tablet    Sig: TAKE 1 TABLET BY MOUTH ONCE DAILY BEFORE BREAKFAST    Dispense:  90 tablet    Refill:  3

## 2014-12-28 NOTE — Assessment & Plan Note (Signed)
S: Asymptomatic except difficulty losing weight Lab Results  Component Value Date   TSH 7.51* 12/28/2014  A/P: TSH remains elevated. Increase synthroid to 267mcg and follow up in 6 weeks with repeat lab.

## 2015-01-03 ENCOUNTER — Encounter: Payer: Self-pay | Admitting: Family Medicine

## 2015-01-04 ENCOUNTER — Encounter: Payer: Self-pay | Admitting: Family Medicine

## 2015-01-04 MED ORDER — FLUCONAZOLE 150 MG PO TABS: ORAL_TABLET | ORAL | Status: DC

## 2015-01-07 ENCOUNTER — Inpatient Hospital Stay (HOSPITAL_COMMUNITY)
Admission: EM | Admit: 2015-01-07 | Discharge: 2015-01-10 | DRG: 871 | Disposition: A | Discharge status: Home Health | Payer: Medicare Other | Attending: Internal Medicine | Admitting: Internal Medicine

## 2015-01-07 ENCOUNTER — Emergency Department (HOSPITAL_COMMUNITY): Payer: Medicare Other

## 2015-01-07 ENCOUNTER — Encounter (HOSPITAL_COMMUNITY): Payer: Self-pay | Admitting: *Deleted

## 2015-01-07 DIAGNOSIS — Z6841 Body Mass Index (BMI) 40.0 and over, adult: Secondary | ICD-10-CM

## 2015-01-07 DIAGNOSIS — G4733 Obstructive sleep apnea (adult) (pediatric): Secondary | ICD-10-CM | POA: Diagnosis not present

## 2015-01-07 DIAGNOSIS — A4151 Sepsis due to Escherichia coli [E. coli]: Secondary | ICD-10-CM | POA: Diagnosis not present

## 2015-01-07 DIAGNOSIS — K219 Gastro-esophageal reflux disease without esophagitis: Secondary | ICD-10-CM | POA: Diagnosis present

## 2015-01-07 DIAGNOSIS — B368 Other specified superficial mycoses: Secondary | ICD-10-CM | POA: Diagnosis present

## 2015-01-07 DIAGNOSIS — I272 Other secondary pulmonary hypertension: Secondary | ICD-10-CM | POA: Diagnosis present

## 2015-01-07 DIAGNOSIS — E785 Hyperlipidemia, unspecified: Secondary | ICD-10-CM | POA: Diagnosis present

## 2015-01-07 DIAGNOSIS — J9621 Acute and chronic respiratory failure with hypoxia: Secondary | ICD-10-CM | POA: Diagnosis not present

## 2015-01-07 DIAGNOSIS — I10 Essential (primary) hypertension: Secondary | ICD-10-CM | POA: Diagnosis not present

## 2015-01-07 DIAGNOSIS — Z853 Personal history of malignant neoplasm of breast: Secondary | ICD-10-CM | POA: Diagnosis not present

## 2015-01-07 DIAGNOSIS — Z7982 Long term (current) use of aspirin: Secondary | ICD-10-CM | POA: Diagnosis not present

## 2015-01-07 DIAGNOSIS — I959 Hypotension, unspecified: Secondary | ICD-10-CM | POA: Diagnosis present

## 2015-01-07 DIAGNOSIS — Z96653 Presence of artificial knee joint, bilateral: Secondary | ICD-10-CM | POA: Diagnosis present

## 2015-01-07 DIAGNOSIS — Z79899 Other long term (current) drug therapy: Secondary | ICD-10-CM

## 2015-01-07 DIAGNOSIS — Z923 Personal history of irradiation: Secondary | ICD-10-CM

## 2015-01-07 DIAGNOSIS — L0390 Cellulitis, unspecified: Secondary | ICD-10-CM | POA: Diagnosis not present

## 2015-01-07 DIAGNOSIS — A419 Sepsis, unspecified organism: Secondary | ICD-10-CM

## 2015-01-07 DIAGNOSIS — B9620 Unspecified Escherichia coli [E. coli] as the cause of diseases classified elsewhere: Secondary | ICD-10-CM | POA: Diagnosis present

## 2015-01-07 DIAGNOSIS — N390 Urinary tract infection, site not specified: Secondary | ICD-10-CM | POA: Diagnosis not present

## 2015-01-07 DIAGNOSIS — E890 Postprocedural hypothyroidism: Secondary | ICD-10-CM | POA: Diagnosis present

## 2015-01-07 DIAGNOSIS — E876 Hypokalemia: Secondary | ICD-10-CM | POA: Diagnosis present

## 2015-01-07 DIAGNOSIS — I9589 Other hypotension: Secondary | ICD-10-CM | POA: Diagnosis present

## 2015-01-07 DIAGNOSIS — L03311 Cellulitis of abdominal wall: Secondary | ICD-10-CM | POA: Diagnosis present

## 2015-01-07 DIAGNOSIS — R509 Fever, unspecified: Secondary | ICD-10-CM | POA: Diagnosis not present

## 2015-01-07 DIAGNOSIS — Z87891 Personal history of nicotine dependence: Secondary | ICD-10-CM | POA: Diagnosis not present

## 2015-01-07 DIAGNOSIS — E038 Other specified hypothyroidism: Secondary | ICD-10-CM | POA: Diagnosis present

## 2015-01-07 DIAGNOSIS — E039 Hypothyroidism, unspecified: Secondary | ICD-10-CM | POA: Diagnosis present

## 2015-01-07 DIAGNOSIS — Z9989 Dependence on other enabling machines and devices: Secondary | ICD-10-CM

## 2015-01-07 HISTORY — DX: Urinary tract infection, site not specified: N39.0

## 2015-01-07 LAB — I-STAT VENOUS BLOOD GAS, ED
Acid-Base Excess: 5 mmol/L — ABNORMAL HIGH (ref 0.0–2.0)
Bicarbonate: 28.1 mEq/L — ABNORMAL HIGH (ref 20.0–24.0)
O2 SAT: 97 %
PCO2 VEN: 35.1 mmHg — AB (ref 45.0–50.0)
TCO2: 29 mmol/L (ref 0–100)
pH, Ven: 7.512 — ABNORMAL HIGH (ref 7.250–7.300)
pO2, Ven: 79 mmHg — ABNORMAL HIGH (ref 30.0–45.0)

## 2015-01-07 LAB — URINE MICROSCOPIC-ADD ON

## 2015-01-07 LAB — URINALYSIS, ROUTINE W REFLEX MICROSCOPIC
Bilirubin Urine: NEGATIVE
GLUCOSE, UA: NEGATIVE mg/dL
Ketones, ur: NEGATIVE mg/dL
Nitrite: POSITIVE — AB
PH: 6 (ref 5.0–8.0)
PROTEIN: NEGATIVE mg/dL
SPECIFIC GRAVITY, URINE: 1.012 (ref 1.005–1.030)
UROBILINOGEN UA: 0.2 mg/dL (ref 0.0–1.0)

## 2015-01-07 LAB — CBC WITH DIFFERENTIAL/PLATELET
BASOS ABS: 0 10*3/uL (ref 0.0–0.1)
BASOS PCT: 0 % (ref 0–1)
EOS PCT: 0 % (ref 0–5)
Eosinophils Absolute: 0 10*3/uL (ref 0.0–0.7)
HEMATOCRIT: 38.4 % (ref 36.0–46.0)
Hemoglobin: 12.9 g/dL (ref 12.0–15.0)
Lymphocytes Relative: 10 % — ABNORMAL LOW (ref 12–46)
Lymphs Abs: 0.9 10*3/uL (ref 0.7–4.0)
MCH: 30.1 pg (ref 26.0–34.0)
MCHC: 33.6 g/dL (ref 30.0–36.0)
MCV: 89.7 fL (ref 78.0–100.0)
MONOS PCT: 10 % (ref 3–12)
Monocytes Absolute: 0.9 10*3/uL (ref 0.1–1.0)
Neutro Abs: 7.2 10*3/uL (ref 1.7–7.7)
Neutrophils Relative %: 80 % — ABNORMAL HIGH (ref 43–77)
PLATELETS: 172 10*3/uL (ref 150–400)
RBC: 4.28 MIL/uL (ref 3.87–5.11)
RDW: 14.4 % (ref 11.5–15.5)
WBC: 9 10*3/uL (ref 4.0–10.5)

## 2015-01-07 LAB — COMPREHENSIVE METABOLIC PANEL
ALT: 17 U/L (ref 14–54)
AST: 19 U/L (ref 15–41)
Albumin: 2.9 g/dL — ABNORMAL LOW (ref 3.5–5.0)
Alkaline Phosphatase: 76 U/L (ref 38–126)
Anion gap: 10 (ref 5–15)
BUN: 10 mg/dL (ref 6–20)
CHLORIDE: 101 mmol/L (ref 101–111)
CO2: 26 mmol/L (ref 22–32)
Calcium: 8.1 mg/dL — ABNORMAL LOW (ref 8.9–10.3)
Creatinine, Ser: 0.98 mg/dL (ref 0.44–1.00)
GFR calc Af Amer: >60 mL/min (ref >60)
GFR calc non Af Amer: 52 mL/min — ABNORMAL LOW (ref >60)
Glucose, Bld: 128 mg/dL — ABNORMAL HIGH (ref 65–99)
Potassium: 3.3 mmol/L — ABNORMAL LOW (ref 3.5–5.1)
Sodium: 137 mmol/L (ref 135–145)
TOTAL PROTEIN: 6.1 g/dL — AB (ref 6.5–8.1)
Total Bilirubin: 0.9 mg/dL (ref 0.3–1.2)

## 2015-01-07 LAB — LACTIC ACID, PLASMA: Lactic Acid, Venous: 1.1 mmol/L (ref 0.5–2.0)

## 2015-01-07 LAB — PROTIME-INR
INR: 1.19 (ref 0.00–1.49)
Prothrombin Time: 15.2 seconds (ref 11.6–15.2)

## 2015-01-07 LAB — I-STAT CG4 LACTIC ACID, ED: LACTIC ACID, VENOUS: 0.84 mmol/L (ref 0.5–2.0)

## 2015-01-07 LAB — PROCALCITONIN: Procalcitonin: 0.11 ng/mL

## 2015-01-07 LAB — BRAIN NATRIURETIC PEPTIDE: B Natriuretic Peptide: 149.7 pg/mL — ABNORMAL HIGH (ref 0.0–100.0)

## 2015-01-07 MED ORDER — ACETAMINOPHEN 325 MG PO TABS
650.0000 mg | ORAL_TABLET | Freq: Four times a day (QID) | ORAL | Status: DC | PRN
  Administered 2015-01-08 – 2015-01-10 (×5): 650 mg via ORAL
  Filled 2015-01-07 (×5): qty 2

## 2015-01-07 MED ORDER — ALPRAZOLAM 0.25 MG PO TABS
0.2500 mg | ORAL_TABLET | Freq: Two times a day (BID) | ORAL | Status: DC | PRN
  Administered 2015-01-08 – 2015-01-09 (×2): 0.25 mg via ORAL
  Filled 2015-01-07 (×4): qty 1

## 2015-01-07 MED ORDER — SODIUM CHLORIDE 0.9 % IJ SOLN
3.0000 mL | Freq: Two times a day (BID) | INTRAMUSCULAR | Status: DC
  Administered 2015-01-07 – 2015-01-09 (×3): 3 mL via INTRAVENOUS

## 2015-01-07 MED ORDER — SODIUM CHLORIDE 0.9 % IV BOLUS (SEPSIS)
1000.0000 mL | Freq: Once | INTRAVENOUS | Status: AC
Start: 2015-01-07 — End: 2015-01-07
  Administered 2015-01-07: 1000 mL via INTRAVENOUS

## 2015-01-07 MED ORDER — MICONAZOLE NITRATE POWD
Freq: Two times a day (BID) | Status: DC
  Administered 2015-01-07 – 2015-01-08 (×2): via TOPICAL

## 2015-01-07 MED ORDER — FAMOTIDINE IN NACL 20-0.9 MG/50ML-% IV SOLN
20.0000 mg | Freq: Two times a day (BID) | INTRAVENOUS | Status: DC
  Administered 2015-01-08 (×2): 20 mg via INTRAVENOUS
  Filled 2015-01-07 (×3): qty 50

## 2015-01-07 MED ORDER — PIPERACILLIN-TAZOBACTAM 3.375 G IVPB 30 MIN
3.3750 g | Freq: Once | INTRAVENOUS | Status: AC
  Administered 2015-01-07: 3.375 g via INTRAVENOUS
  Filled 2015-01-07: qty 50

## 2015-01-07 MED ORDER — ONDANSETRON HCL 4 MG PO TABS: 4.0000 mg | ORAL_TABLET | Freq: Four times a day (QID) | ORAL | Status: DC | PRN

## 2015-01-07 MED ORDER — VANCOMYCIN HCL 10 G IV SOLR
2500.0000 mg | Freq: Once | INTRAVENOUS | Status: AC
  Administered 2015-01-07: 2500 mg via INTRAVENOUS
  Filled 2015-01-07: qty 2500

## 2015-01-07 MED ORDER — ENOXAPARIN SODIUM 40 MG/0.4ML ~~LOC~~ SOLN
40.0000 mg | SUBCUTANEOUS | Status: DC
Start: 2015-01-07 — End: 2015-01-07

## 2015-01-07 MED ORDER — DIPHENHYDRAMINE HCL 25 MG PO CAPS
25.0000 mg | ORAL_CAPSULE | Freq: Four times a day (QID) | ORAL | Status: DC | PRN
  Administered 2015-01-09: 25 mg via ORAL
  Filled 2015-01-07: qty 1

## 2015-01-07 MED ORDER — ASPIRIN 81 MG PO TABS: 81.0000 mg | ORAL_TABLET | Freq: Every morning | ORAL | Status: DC

## 2015-01-07 MED ORDER — PANTOPRAZOLE SODIUM 40 MG PO TBEC: 40.0000 mg | DELAYED_RELEASE_TABLET | Freq: Every day | ORAL | Status: DC

## 2015-01-07 MED ORDER — IBUPROFEN 200 MG PO TABS
600.0000 mg | ORAL_TABLET | Freq: Once | ORAL | Status: AC
  Administered 2015-01-07: 600 mg via ORAL
  Filled 2015-01-07 (×2): qty 1

## 2015-01-07 MED ORDER — SODIUM CHLORIDE 0.9 % IV BOLUS (SEPSIS)
1000.0000 mL | Freq: Once | INTRAVENOUS | Status: AC
  Administered 2015-01-07: 1000 mL via INTRAVENOUS

## 2015-01-07 MED ORDER — ASPIRIN EC 81 MG PO TBEC
81.0000 mg | DELAYED_RELEASE_TABLET | Freq: Every day | ORAL | Status: DC
Start: 2015-01-08 — End: 2015-01-10
  Administered 2015-01-08 – 2015-01-10 (×3): 81 mg via ORAL
  Filled 2015-01-07 (×3): qty 1

## 2015-01-07 MED ORDER — ONDANSETRON HCL 4 MG/2ML IJ SOLN
4.0000 mg | Freq: Four times a day (QID) | INTRAMUSCULAR | Status: DC | PRN
  Administered 2015-01-08: 4 mg via INTRAVENOUS
  Filled 2015-01-07: qty 2

## 2015-01-07 MED ORDER — ENOXAPARIN SODIUM 60 MG/0.6ML ~~LOC~~ SOLN
60.0000 mg | Freq: Every day | SUBCUTANEOUS | Status: DC
  Administered 2015-01-08 – 2015-01-09 (×3): 60 mg via SUBCUTANEOUS
  Filled 2015-01-07 (×4): qty 0.6

## 2015-01-07 MED ORDER — ACETAMINOPHEN 650 MG RE SUPP
650.0000 mg | Freq: Four times a day (QID) | RECTAL | Status: DC | PRN
  Filled 2015-01-07: qty 1

## 2015-01-07 MED ORDER — DIPHENHYDRAMINE HCL 50 MG/ML IJ SOLN
25.0000 mg | Freq: Once | INTRAMUSCULAR | Status: AC
  Administered 2015-01-07: 25 mg via INTRAVENOUS
  Filled 2015-01-07: qty 1

## 2015-01-07 MED ORDER — LEVOTHYROXINE SODIUM 200 MCG PO TABS
200.0000 ug | ORAL_TABLET | Freq: Every day | ORAL | Status: DC
  Administered 2015-01-08 – 2015-01-10 (×3): 200 ug via ORAL
  Filled 2015-01-07 (×4): qty 1

## 2015-01-07 MED ORDER — PIPERACILLIN-TAZOBACTAM 3.375 G IVPB
3.3750 g | Freq: Three times a day (TID) | INTRAVENOUS | Status: DC
  Administered 2015-01-08 (×2): 3.375 g via INTRAVENOUS
  Filled 2015-01-07 (×4): qty 50

## 2015-01-07 MED ORDER — VANCOMYCIN HCL IN DEXTROSE 1-5 GM/200ML-% IV SOLN: 1000.0000 mg | Freq: Once | INTRAVENOUS | Status: DC

## 2015-01-07 MED ORDER — DOXYCYCLINE HYCLATE 100 MG IV SOLR
100.0000 mg | Freq: Two times a day (BID) | INTRAVENOUS | Status: DC
  Administered 2015-01-07 – 2015-01-08 (×2): 100 mg via INTRAVENOUS
  Filled 2015-01-07 (×3): qty 100

## 2015-01-07 MED ORDER — SODIUM CHLORIDE 0.9 % IV SOLN
INTRAVENOUS | Status: DC
  Administered 2015-01-07 – 2015-01-10 (×4): via INTRAVENOUS

## 2015-01-07 NOTE — ED Notes (Addendum)
Nurse from [facility] on [address] called [facility] for fever x 2 days.  Temp of 102, nausea with hr in 120's.  BP 120/66, hr 108, sats 88% on RA that increased to 94% on 2 L (Pt with sats of 94% on room air in ED).  Pt took 2 tylenol at 1500.

## 2015-01-07 NOTE — ED Notes (Signed)
Lab at the bedside 

## 2015-01-07 NOTE — ED Notes (Signed)
ABD pads placed between folds of abdominal skin to create barrier and assist with moisture and yeast

## 2015-01-07 NOTE — ED Notes (Signed)
Attempted report 

## 2015-01-07 NOTE — ED Notes (Addendum)
Pt having redness and burning to site where vancomycin is infusing.  Dr Joya Gaskins and pharmacist notified.  Both stated to give benadryl and slow infusion to 2 hour infusion and see how pt tolerates.

## 2015-01-07 NOTE — ED Provider Notes (Signed)
CSN: [id number]     Arrival date & time 01/07/15  1658 History   First MD Initiated Contact with Patient 01/07/15 1700     Chief Complaint  Patient presents with  . Fever  . Nausea   (Consider location/radiation/quality/duration/timing/severity/associated sxs/prior Treatment) Patient is a 81 y.o. female presenting with fever. The history is provided by the patient. No language interpreter was used.  Fever Max temp prior to arrival:  102 Temp source:  Oral Severity:  Moderate Onset quality:  Gradual Timing:  Intermittent Progression:  Waxing and waning Chronicity:  New Relieved by:  Acetaminophen Worsened by:  Nothing tried Ineffective treatments:  None tried Associated symptoms: chills, cough, myalgias and rash (candidiasis on panus)   Associated symptoms: no chest pain, no confusion, no diarrhea, no dysuria, no headaches, no nausea and no vomiting   Risk factors: no recent surgery, no recent travel and no sick contacts     Past Medical History  Diagnosis Date  . Morbid obesity   . MIGRAINE HEADACHE   . ARTHRITIS, KNEES, BILATERAL   . DEPRESSION   . GERD   . HYPERLIPIDEMIA   . HYPERTENSION   . DJD (degenerative joint disease) of knee   . Urge incontinence   . HYPOTHYROIDISM     postsurgical  . OSA (obstructive sleep apnea) 01/17/2011 dx  . Hypoxia   . Oxygen dependent   . Breast CA 2000    s/p R lumpectomy and XRT   Past Surgical History  Procedure Laterality Date  . Cholecystectomy  1995  . Breast lumpectomy  2000  . Thyroidectomy    . Breast biopsy  2000  . Replacement total knee bilateral  1999, 2005  . Tubal ligation    . Parathyroidectomy      2-3 removed  . Tonsillectomy    . Adenoidectomy    . Right leg femur repaired     Family History  Problem Relation Age of Onset  . Emphysema Sister   . Coronary artery disease Neg Hx    History  Substance Use Topics  . Smoking status: Former Smoker -- 0.25 packs/day for 2 years    Types: Cigarettes   Quit date: 06/19/1974  . Smokeless tobacco: Never Used     Comment: Quit in late 20's  . Alcohol Use: 0.0 oz/week    0 Standard drinks or equivalent per week     Comment: rare   OB History    Gravida Para Term Preterm AB TAB SAB Ectopic Multiple Living   5 5             Review of Systems  Constitutional: Positive for fever, chills, diaphoresis and fatigue.  Respiratory: Positive for cough. Negative for chest tightness and shortness of breath.   Cardiovascular: Negative for chest pain.  Gastrointestinal: Negative for nausea, vomiting, abdominal pain and diarrhea.  Genitourinary: Negative for dysuria.  Musculoskeletal: Positive for myalgias.  Skin: Positive for rash (candidiasis on panus).  Neurological: Negative for weakness, light-headedness and headaches.  Psychiatric/Behavioral: Negative for confusion.  All other systems reviewed and are negative.     Allergies  Azithromycin; Beta adrenergic blockers; Ciprofloxacin; Codeine; Epinephrine; Klonopin; Oxycodone; Paxil; Prednisone; Propoxyphene hcl; and Meloxicam  Home Medications   Prior to Admission medications   Medication Sig Start Date End Date Taking? Authorizing Provider  ALPRAZolam (XANAX) 0.25 MG tablet TAKE 1/2 to 1 TABLET BY MOUTH TWICE A DAY AS NEEDED FOR ANXIETY OR SLEEP 10/07/14  Yes Marin Olp, MD  aspirin 81  MG tablet Take 81 mg by mouth every morning.    Yes Historical Provider, MD  FIBER PO Take 2 tablets by mouth every morning.    Yes Historical Provider, MD  fluconazole (DIFLUCAN) 150 MG tablet Take one dose, repeat dose in 72 hours if infection symptoms persist. Office visit needed if symptoms persist. 01/04/15  Yes Marin Olp, MD  levothyroxine (SYNTHROID, LEVOTHROID) 200 MCG tablet TAKE 1 TABLET BY MOUTH ONCE DAILY BEFORE BREAKFAST 12/28/14  Yes Marin Olp, MD  loratadine (CLARITIN) 10 MG tablet Take 10 mg by mouth daily as needed for allergies.    Yes Historical Provider, MD  nystatin  (MYCOSTATIN/NYSTOP) 100000 UNIT/GM POWD APPLY TO AFFECTED AREA TWICE A DAY Patient taking differently: APPLY TO AFFECTED AREA TWICE A DAY AS NEEDED 11/25/14  Yes Marin Olp, MD  Omega-3 Fatty Acids (FISH OIL) 1200 MG CAPS Take 2,400 mg by mouth daily.    Yes Historical Provider, MD  omeprazole (PRILOSEC) 20 MG capsule TAKE 1 CAPSULE (20 MG TOTAL) BY MOUTH 2 (TWO) TIMES DAILY. 11/06/14  Yes Marin Olp, MD  phenazopyridine (AZO-TABS) 95 MG tablet Take 95 mg by mouth daily.    Yes Historical Provider, MD  potassium chloride (MICRO-K) 10 MEQ CR capsule TAKE 2 CAPSULE BY MOUTH ONCE DAILY Patient taking differently: Take 20 mEq by mouth daily.  09/28/14  Yes Marin Olp, MD  senna-docusate (SENOKOT-S) 8.6-50 MG per tablet Take 2 tablets by mouth at bedtime as needed for mild constipation. 08/29/13  Yes Rowe Clack, MD  TRIBENZOR 20-5-12.5 MG TABS TAKE 1 TABLET BY MOUTH EVERY DAY 11/10/14  Yes Marin Olp, MD  furosemide (LASIX) 40 MG tablet TAKE 1 TABLET BY MOUTH ONCE DAILY Patient not taking: Reported on 01/07/2015 10/21/14   Marin Olp, MD   BP 118/60 mmHg  Pulse 91  Temp(Src) 99 F (37.2 C) (Oral)  Resp 16  SpO2 93%   Physical Exam  Constitutional: She is oriented to person, place, and time. She appears well-developed and well-nourished. No distress.  HENT:  Head: Normocephalic and atraumatic.  Nose: Nose normal.  Mouth/Throat: Oropharynx is clear and moist. No oropharyngeal exudate.  Eyes: EOM are normal. Pupils are equal, round, and reactive to light.  Neck: Normal range of motion. Neck supple.  Cardiovascular: Normal rate, regular rhythm, normal heart sounds and intact distal pulses.   No murmur heard. Pulmonary/Chest: Effort normal and breath sounds normal. No respiratory distress. She has no wheezes. She exhibits no tenderness.  O2 by [place] at 2L.  No increased work of breathing.  + mild tachypnea.  No wheezing or rhonchi noted  Abdominal: Soft. There is no  tenderness. There is no rebound and no guarding.  Candidiasis infection under pannus, worse on right lower abdomen than left.  Abd pads placed  Musculoskeletal: Normal range of motion. She exhibits no tenderness.  Lymphadenopathy:    She has no cervical adenopathy.  Neurological: She is alert and oriented to person, place, and time. No cranial nerve deficit. Coordination normal.  Skin: Skin is warm and dry. She is not diaphoretic.  Psychiatric: She has a normal mood and affect. Her behavior is normal. Judgment and thought content normal.  Nursing note and vitals reviewed.   ED Course  Procedures (including critical care time) Labs Review Labs Reviewed  CBC WITH DIFFERENTIAL/PLATELET - Abnormal; Notable for the following:    Neutrophils Relative % 80 (*)    Lymphocytes Relative 10 (*)  All other components within normal limits  I-STAT VENOUS BLOOD GAS, ED - Abnormal; Notable for the following:    pH, Ven 7.512 (*)    pCO2, Ven 35.1 (*)    pO2, Ven 79.0 (*)    Bicarbonate 28.1 (*)    Acid-Base Excess 5.0 (*)    All other components within normal limits  CULTURE, BLOOD (ROUTINE X 2)  CULTURE, BLOOD (ROUTINE X 2)  URINE CULTURE  GRAM STAIN  COMPREHENSIVE METABOLIC PANEL  URINALYSIS, ROUTINE W REFLEX MICROSCOPIC (NOT AT [facility])  BRAIN NATRIURETIC PEPTIDE  BLOOD GAS, VENOUS  PROTIME-INR  I-STAT CG4 LACTIC ACID, ED   Imaging Review Dg Chest 2 View  01/07/2015   CLINICAL DATA:  Fever for 2 days.  EXAM: CHEST  2 VIEW  COMPARISON:  12/21/2014  FINDINGS: Mild cardiac enlargement. No pleural effusion or edema. No airspace consolidation identified. Chronic right posterior rib fracture deformities are again identified. There are surgical clips noted within the right axilla and right breast as before.  IMPRESSION: 1. No acute cardiopulmonary abnormalities. 2. Cardiac enlargement.   Electronically Signed   By: Kerby Moors M.D.   On: 01/07/2015 18:51     EKG Interpretation   Date/Time:   Thursday January 07 2015 17:16:00 EDT Ventricular Rate:  101 PR Interval:  156 QRS Duration: 95 QT Interval:  327 QTC Calculation: 424 R Axis:   20 Text Interpretation:  Sinus tachycardia Probable left atrial enlargement  RSR' in V1 or V2, right VCD or RVH ED PHYSICIAN INTERPRETATION AVAILABLE  IN [facility] Confirmed by TEST, Record ([id number]) on 01/08/2015 6:51:48  AM      MDM   Final diagnoses:  Sepsis   Pt is a 81 yo F with hx of morbid obesity, hypothyroidism, migraine headache, breast cancer s/p radiation who presents with 2 days of fever and myalgias.  Tmax 102, treated with PRN tylenol, last dose just prior to arrival.  Lives in assisted living facility.  Has recently been seen by PCP and started on diflucant for candidiasis on abdomen 2 days ago.  Denies any other infectious sx, including no cough, rhinorrhea, headache, abdominal pain, N/V/D, or dysuria.  No hx of recurrent UTIs.    Looks uncomfortable but not toxic.  Requiring 2L of O2 by [place].  Dropped down to 87% on room air.  Normally does not require O2.  Due to O2 requirement, elevated temp just prior to arrival (however afebrile in triage), residence in a facility, and overall appearance, will send sepsis labs, blood and urine cultures, and start on empiric Abx.  Covered with vanc and zosyn.  Given NS bolus and motrin.    Lactate 0.84.  BNP 149.  Normal electrolytes and kidney function.  No leukocytosis.  CXR returned negative for pneumonia.   Cath'd UA with positive nitrites and moderate leuks, 20-50 WBC, and rare epithelial cells, so consistent with UTI.   Already covered with her empiric Abx.    Due to continued new O2 requirement and known infection, will admit to hospitalist team.  Pt aware and agreeable to admission.    If performed, labs, EKGs, and imaging were reviewed and interpreted by myself and my attending, and incorporated in the medical decision making.  Patient was seen with ED Attending, Dr. Alfonse Spruce, MD     Tori Milks, MD 01/08/15 [place], MD 01/08/15 [phone]

## 2015-01-07 NOTE — ED Notes (Signed)
Dr. Posey Pronto back at the bedside.

## 2015-01-07 NOTE — H&P (Signed)
[facility] History and Physical  Patient: Tamara Morales  MRN: [id number]  DOB: 1930/08/12  DOS: the patient was seen and examined on 01/07/2015 PCP: Tana Conch, MD  Referring physician: Dr Judd Lien Chief Complaint: chills   HPI: Tamara Morales is a 81 y.o. female with Past medical history of obstructive sleep apnea on C Pap, hypothyroidism, essential hypertension, GERD, breast cancer. The patient is presenting with numbness of fever and chills. The patient mentions that she was recently diagnosed with infection of the pannus and was placed on Diflucan and she has taken one tablet 3 days ago. She started having noted of complaints of fever and fatigue and then started having chills on Wednesday night. She remained having fatigue as well as malaise throughout the day on Thursday and decided to come to the hospital as she was having fever again. He denies having any complaints of headache, dizziness, nausea, vomiting, diarrhea, constipation. Has chronic burning when she is urinating in his taking pyridium. Denies any active bleeding anywhere. Denies any trauma or injury. Other than the change in Diflucan she is not taking any other new medication.  The patient is coming from ALF.  At her baseline ambulates with walker And is independent for most of her ADL manages her medication on her own.  Review of Systems: as mentioned in the history of present illness.  A comprehensive review of the other systems is negative.  Past Medical History  Diagnosis Date  . Morbid obesity   . MIGRAINE HEADACHE   . ARTHRITIS, KNEES, BILATERAL   . DEPRESSION   . GERD   . HYPERLIPIDEMIA   . HYPERTENSION   . DJD (degenerative joint disease) of knee   . Urge incontinence   . HYPOTHYROIDISM     postsurgical  . OSA (obstructive sleep apnea) 01/17/2011 dx  . Hypoxia   . Oxygen dependent   . Breast CA 2000    s/p R lumpectomy and XRT   Past Surgical History  Procedure Laterality Date    . Cholecystectomy  1995  . Breast lumpectomy  2000  . Thyroidectomy    . Breast biopsy  2000  . Replacement total knee bilateral  1999, 2005  . Tubal ligation    . Parathyroidectomy      2-3 removed  . Tonsillectomy    . Adenoidectomy    . Right leg femur repaired     Social History:  reports that she quit smoking about 40 years ago. Her smoking use included Cigarettes. She has a .5 pack-year smoking history. She has never used smokeless tobacco. She reports that she drinks alcohol. She reports that she does not use illicit drugs.  Allergies  Allergen Reactions  . Azithromycin Itching  . Beta Adrenergic Blockers     Depression   . Ciprofloxacin Other (See Comments)    REACTION: edgy and very jumpy   . Codeine Nausea And Vomiting  . Epinephrine Other (See Comments)    REACTION: rapid pulse, sweats  . Klonopin [Clonazepam] Other (See Comments)    Makes her feel very suicidal   . Oxycodone Other (See Comments)    Patient felt like it altered her mental status "Crazy" . Hallucinations later as well.  [name] Paxil [Paroxetine Hydrochloride] Other (See Comments)    Sever depression   . Prednisone     "makes me feel really bad"  . Propoxyphene Hcl Nausea And Vomiting  . Meloxicam Diarrhea  . Vancomycin Rash    Localized rash related to infusion  rate.    Family History  Problem Relation Age of Onset  . Emphysema Sister   . Coronary artery disease Neg Hx     Prior to Admission medications   Medication Sig Start Date End Date Taking? Authorizing Provider  ALPRAZolam (XANAX) 0.25 MG tablet TAKE 1/2 to 1 TABLET BY MOUTH TWICE A DAY AS NEEDED FOR ANXIETY OR SLEEP 10/07/14  Yes Marin Olp, MD  aspirin 81 MG tablet Take 81 mg by mouth every morning.    Yes Historical Provider, MD  FIBER PO Take 2 tablets by mouth every morning.    Yes Historical Provider, MD  fluconazole (DIFLUCAN) 150 MG tablet Take one dose, repeat dose in 72 hours if infection symptoms persist. Office visit  needed if symptoms persist. 01/04/15  Yes Marin Olp, MD  levothyroxine (SYNTHROID, LEVOTHROID) 200 MCG tablet TAKE 1 TABLET BY MOUTH ONCE DAILY BEFORE BREAKFAST 12/28/14  Yes Marin Olp, MD  loratadine (CLARITIN) 10 MG tablet Take 10 mg by mouth daily as needed for allergies.    Yes Historical Provider, MD  nystatin (MYCOSTATIN/NYSTOP) 100000 UNIT/GM POWD APPLY TO AFFECTED AREA TWICE A DAY Patient taking differently: APPLY TO AFFECTED AREA TWICE A DAY AS NEEDED 11/25/14  Yes Marin Olp, MD  Omega-3 Fatty Acids (FISH OIL) 1200 MG CAPS Take 2,400 mg by mouth daily.    Yes Historical Provider, MD  omeprazole (PRILOSEC) 20 MG capsule TAKE 1 CAPSULE (20 MG TOTAL) BY MOUTH 2 (TWO) TIMES DAILY. 11/06/14  Yes Marin Olp, MD  phenazopyridine (AZO-TABS) 95 MG tablet Take 95 mg by mouth daily.    Yes Historical Provider, MD  potassium chloride (MICRO-K) 10 MEQ CR capsule TAKE 2 CAPSULE BY MOUTH ONCE DAILY Patient taking differently: Take 20 mEq by mouth daily.  09/28/14  Yes Marin Olp, MD  senna-docusate (SENOKOT-S) 8.6-50 MG per tablet Take 2 tablets by mouth at bedtime as needed for mild constipation. 08/29/13  Yes Rowe Clack, MD  TRIBENZOR 20-5-12.5 MG TABS TAKE 1 TABLET BY MOUTH EVERY DAY 11/10/14  Yes Marin Olp, MD  furosemide (LASIX) 40 MG tablet TAKE 1 TABLET BY MOUTH ONCE DAILY Patient not taking: Reported on 01/07/2015 10/21/14   Marin Olp, MD    Physical Exam: Filed Vitals:   01/07/15 2130 01/07/15 2145 01/07/15 2200 01/07/15 2215  BP: 109/49 99/46 107/57 97/61  Pulse: 90 91 89 82  Temp:      TempSrc:      Resp: $Remo'26 21 25 22  'DxiCq$ SpO2: 94% 97% 93% 95%    General: Alert, Awake and Oriented to Time, Place and Person. Appear in mild distress Eyes: PERRL ENT: Oral Mucosa clear moist. Neck: no JVD Cardiovascular: S1 and S2 Present, no Murmur, Peripheral Pulses Present Respiratory: Bilateral Air entry equal and Decreased,  Clear to Auscultation, no  Crackles, no wheezes Abdomen: Bowel Sound present, Soft and non tender Skin: Diffuse redness noted under the pannus, Also a large area on the left foot noted with redness warmth without any tenderness Extremities: bilateral Pedal edema, no calf tenderness Neurologic: Grossly no focal neuro deficit.  Labs on Admission:  CBC:  Recent Labs Lab 01/07/15 1728  WBC 9.0  NEUTROABS 7.2  HGB 12.9  HCT 38.4  MCV 89.7  PLT 172    CMP     Component Value Date/Time   NA 137 01/07/2015 1728   K 3.3* 01/07/2015 1728   CL 101 01/07/2015 1728   CO2 26 01/07/2015  1728   GLUCOSE 128* 01/07/2015 1728   BUN 10 01/07/2015 1728   CREATININE 0.98 01/07/2015 1728   CALCIUM 8.1* 01/07/2015 1728   PROT 6.1* 01/07/2015 1728   ALBUMIN 2.9* 01/07/2015 1728   AST 19 01/07/2015 1728   ALT 17 01/07/2015 1728   ALKPHOS 76 01/07/2015 1728   BILITOT 0.9 01/07/2015 1728   GFRNONAA 52* 01/07/2015 1728   GFRAA >60 01/07/2015 1728    No results for input(s): LIPASE, AMYLASE in the last 168 hours.  No results for input(s): CKTOTAL, CKMB, CKMBINDEX, TROPONINI in the last 168 hours. BNP (last 3 results)  Recent Labs  11/03/14 0638 12/21/14 0940 01/07/15 1745  BNP 63.6 74.2 149.7*    ProBNP (last 3 results) No results for input(s): PROBNP in the last 8760 hours.   Radiological Exams on Admission: Dg Chest 2 View  01/07/2015   CLINICAL DATA:  Fever for 2 days.  EXAM: CHEST  2 VIEW  COMPARISON:  12/21/2014  FINDINGS: Mild cardiac enlargement. No pleural effusion or edema. No airspace consolidation identified. Chronic right posterior rib fracture deformities are again identified. There are surgical clips noted within the right axilla and right breast as before.  IMPRESSION: 1. No acute cardiopulmonary abnormalities. 2. Cardiac enlargement.   Electronically Signed   By: Kerby Moors M.D.   On: 01/07/2015 18:51   Assessment/Plan Principal Problem:   Sepsis Active Problems:   Hypothyroidism    Morbid obesity   Essential hypertension   GERD   OSA (obstructive sleep apnea)   Cellulitis   UTI (urinary tract infection)   1. Sepsis The patient is presenting with numbness of fever and chills. Although initially her hemodynamic vitals were stable later on she become hypotensive. I'm acting as internal continues to remain nonelevated. With that she is currently being admitted in the step down unit. She has developed a rash with vancomycin requiring significant slowing of the infusion despite which she continues to have some itching and therefore have discontinued vancomycin and placed on doxycycline. Her urine is positive for gram-negative rods and she has history of Escherichia coli UTI in the past which is sensitive to Zosyn which I would continue to cover her broadly. Monitor cultures. She has received so far 2.5 L of IV fluid bolus and I would continue to give her a total of 3 L of normal saline bolus. We will also continue to IV hydration.  2. essential hypertension. Holding her home medications.  3. possible sleep apnea. Continuing C Pap daily at bedtime. She has mild hypoxia but appears to be chronic.  4. hypothyroidism. Continuing Synthroid at home doses.  5.Pannus with cellulitis. We will continue with topical anti-from the powder. Also continue with doxycycline.   Advance goals of care discussion: Full code as per my discussion with the patient  Patient's daughter Amy will be her power of attorney    DVT Prophylaxis: subcutaneous Heparin Nutrition: Cardiac diet   Family Communication: family was present at bedside, opportunity was given to ask question and all questions were answered satisfactorily at the time of interview. Disposition: Admitted as inpatient, step-down unit.  Author: Berle Mull, MD [facility] Pager: [phone] 01/07/2015  If 7PM-7AM, please contact night-coverage [email or web address] Password [email or web address]

## 2015-01-07 NOTE — Progress Notes (Signed)
ANTIBIOTIC CONSULT NOTE - INITIAL  Pharmacy Consult for vancomycin, zosyn  Indication: rule out sepsis  Allergies  Allergen Reactions  . Azithromycin Itching  . Beta Adrenergic Blockers     Depression   . Ciprofloxacin Other (See Comments)    REACTION: edgy and very jumpy   . Codeine Nausea And Vomiting  . Epinephrine Other (See Comments)    REACTION: rapid pulse, sweats  . Klonopin [Clonazepam] Other (See Comments)    Makes her feel very suicidal   . Oxycodone Other (See Comments)    Patient felt like it altered her mental status "Crazy" . Hallucinations later as well.  [name] Paxil [Paroxetine Hydrochloride] Other (See Comments)    Sever depression   . Prednisone     "makes me feel really bad"  . Propoxyphene Hcl Nausea And Vomiting  . Meloxicam Diarrhea    Patient Measurements:   Adjusted Body Weight:   Vital Signs: Temp: 99 F (37.2 C) (07/21 1719) Temp Source: Oral (07/21 1719) BP: 119/40 mmHg (07/21 1719) Pulse Rate: 104 (07/21 1719) Intake/Output from previous day:   Intake/Output from this shift:    Labs: No results for input(s): WBC, HGB, PLT, LABCREA, CREATININE in the last 72 hours. Estimated Creatinine Clearance: 67.2 mL/min (by C-G formula based on Cr of 0.9). No results for input(s): VANCOTROUGH, VANCOPEAK, VANCORANDOM, GENTTROUGH, GENTPEAK, GENTRANDOM, TOBRATROUGH, TOBRAPEAK, TOBRARND, AMIKACINPEAK, AMIKACINTROU, AMIKACIN in the last 72 hours.   Microbiology: Recent Results (from the past 720 hour(s))  Urine culture     Status: None   Collection Time: 12/21/14 11:19 AM  Result Value Ref Range Status   Specimen Description URINE, CLEAN CATCH  Final   Special Requests Normal  Final   Culture >=100,000 COLONIES/mL ESCHERICHIA COLI  Final   Report Status 12/23/2014 FINAL  Final   Organism ID, Bacteria ESCHERICHIA COLI  Final      Susceptibility   Escherichia coli - MIC*    AMPICILLIN 4 SENSITIVE Sensitive     CEFAZOLIN <=4 SENSITIVE Sensitive    CEFTRIAXONE <=1 SENSITIVE Sensitive     CIPROFLOXACIN >=4 RESISTANT Resistant     GENTAMICIN <=1 SENSITIVE Sensitive     IMIPENEM <=0.25 SENSITIVE Sensitive     NITROFURANTOIN <=16 SENSITIVE Sensitive     TRIMETH/SULFA <=20 SENSITIVE Sensitive     AMPICILLIN/SULBACTAM <=2 SENSITIVE Sensitive     PIP/TAZO <=4 SENSITIVE Sensitive     * >=100,000 COLONIES/mL ESCHERICHIA COLI    Medical History: Past Medical History  Diagnosis Date  . Morbid obesity   . MIGRAINE HEADACHE   . ARTHRITIS, KNEES, BILATERAL   . DEPRESSION   . GERD   . HYPERLIPIDEMIA   . HYPERTENSION   . DJD (degenerative joint disease) of knee   . Urge incontinence   . HYPOTHYROIDISM     postsurgical  . OSA (obstructive sleep apnea) 01/17/2011 dx  . Hypoxia   . Oxygen dependent   . Breast CA 2000    s/p R lumpectomy and XRT    Medications:   (Not in a hospital admission)   Assessment: 81 yo female empirically starting antibiotics, tm 102, LA <1, wbc 9, SCr 0.9, normalized eCrCl  ~55-60 ml/min.  Goal of Therapy:  Vancomycin trough level 15-20 mcg/ml  Plan:  -Zosyn 3.375 g IV q8h -Vancomycin 2500 mg IV x1 then 1500 mg IV q24h -Monitor renal fx, cultures, VT as needed    Hughes Better, PharmD, BCPS Clinical Pharmacist Pager: [phone] 01/07/2015 5:45 PM

## 2015-01-07 NOTE — ED Notes (Signed)
Dr. Posey Pronto paged for BP issues. NS turned up to 999 ml/hr at this time on pump.

## 2015-01-07 NOTE — ED Notes (Signed)
Pt placed in a gown and hooked up to the monitor with the 5 lead, BP cuff and pulse ox

## 2015-01-07 NOTE — ED Notes (Signed)
Gram negative rods in her urine per Melissa B. RN/ AD.

## 2015-01-08 ENCOUNTER — Encounter (HOSPITAL_COMMUNITY): Payer: Self-pay | Admitting: General Practice

## 2015-01-08 LAB — CBC WITH DIFFERENTIAL/PLATELET
Basophils Absolute: 0 10*3/uL (ref 0.0–0.1)
Basophils Relative: 0 % (ref 0–1)
Eosinophils Absolute: 0 10*3/uL (ref 0.0–0.7)
Eosinophils Relative: 0 % (ref 0–5)
HEMATOCRIT: 34.9 % — AB (ref 36.0–46.0)
Hemoglobin: 11.5 g/dL — ABNORMAL LOW (ref 12.0–15.0)
LYMPHS ABS: 0.7 10*3/uL (ref 0.7–4.0)
Lymphocytes Relative: 8 % — ABNORMAL LOW (ref 12–46)
MCH: 29.7 pg (ref 26.0–34.0)
MCHC: 33 g/dL (ref 30.0–36.0)
MCV: 90.2 fL (ref 78.0–100.0)
Monocytes Absolute: 0.9 10*3/uL (ref 0.1–1.0)
Monocytes Relative: 10 % (ref 3–12)
Neutro Abs: 7.3 10*3/uL (ref 1.7–7.7)
Neutrophils Relative %: 82 % — ABNORMAL HIGH (ref 43–77)
PLATELETS: 157 10*3/uL (ref 150–400)
RBC: 3.87 MIL/uL (ref 3.87–5.11)
RDW: 14.5 % (ref 11.5–15.5)
WBC: 8.9 10*3/uL (ref 4.0–10.5)

## 2015-01-08 LAB — COMPREHENSIVE METABOLIC PANEL
ALT: 21 U/L (ref 14–54)
AST: 24 U/L (ref 15–41)
Albumin: 2.4 g/dL — ABNORMAL LOW (ref 3.5–5.0)
Alkaline Phosphatase: 73 U/L (ref 38–126)
Anion gap: 8 (ref 5–15)
BUN: 6 mg/dL (ref 6–20)
CHLORIDE: 103 mmol/L (ref 101–111)
CO2: 26 mmol/L (ref 22–32)
Calcium: 7.3 mg/dL — ABNORMAL LOW (ref 8.9–10.3)
Creatinine, Ser: 0.84 mg/dL (ref 0.44–1.00)
GFR calc Af Amer: >60 mL/min (ref >60)
GFR calc non Af Amer: >60 mL/min (ref >60)
GLUCOSE: 135 mg/dL — AB (ref 65–99)
Potassium: 2.8 mmol/L — ABNORMAL LOW (ref 3.5–5.1)
Sodium: 137 mmol/L (ref 135–145)
Total Bilirubin: 1 mg/dL (ref 0.3–1.2)
Total Protein: 5.6 g/dL — ABNORMAL LOW (ref 6.5–8.1)

## 2015-01-08 LAB — PROTIME-INR
INR: 1.18 (ref 0.00–1.49)
Prothrombin Time: 15.2 seconds (ref 11.6–15.2)

## 2015-01-08 LAB — GRAM STAIN

## 2015-01-08 LAB — MRSA PCR SCREENING: MRSA BY PCR: NEGATIVE

## 2015-01-08 MED ORDER — POTASSIUM CHLORIDE CRYS ER 20 MEQ PO TBCR
40.0000 meq | EXTENDED_RELEASE_TABLET | Freq: Once | ORAL | Status: AC
  Administered 2015-01-08: 40 meq via ORAL
  Filled 2015-01-08: qty 2

## 2015-01-08 MED ORDER — MICONAZOLE NITRATE POWD
Freq: Three times a day (TID) | Status: DC
  Administered 2015-01-08 – 2015-01-10 (×6): via TOPICAL

## 2015-01-08 MED ORDER — FAMOTIDINE 20 MG PO TABS
20.0000 mg | ORAL_TABLET | Freq: Two times a day (BID) | ORAL | Status: DC
  Administered 2015-01-08 – 2015-01-10 (×4): 20 mg via ORAL
  Filled 2015-01-08 (×5): qty 1

## 2015-01-08 MED ORDER — PHENAZOPYRIDINE HCL 100 MG PO TABS
95.0000 mg | ORAL_TABLET | Freq: Every day | ORAL | Status: DC
  Administered 2015-01-08 – 2015-01-10 (×3): 100 mg via ORAL
  Filled 2015-01-08 (×3): qty 1

## 2015-01-08 MED ORDER — ZOLPIDEM TARTRATE 5 MG PO TABS
5.0000 mg | ORAL_TABLET | Freq: Once | ORAL | Status: AC
  Administered 2015-01-08: 5 mg via ORAL
  Filled 2015-01-08: qty 1

## 2015-01-08 MED ORDER — CEFTRIAXONE SODIUM IN DEXTROSE 20 MG/ML IV SOLN
1.0000 g | INTRAVENOUS | Status: DC
  Administered 2015-01-08 – 2015-01-09 (×2): 1 g via INTRAVENOUS
  Filled 2015-01-08 (×3): qty 50

## 2015-01-08 MED ORDER — ALBUTEROL SULFATE (2.5 MG/3ML) 0.083% IN NEBU
2.5000 mg | INHALATION_SOLUTION | RESPIRATORY_TRACT | Status: DC | PRN
  Administered 2015-01-09: 2.5 mg via RESPIRATORY_TRACT
  Filled 2015-01-08: qty 3

## 2015-01-08 MED ORDER — ALPRAZOLAM 0.25 MG PO TABS
0.2500 mg | ORAL_TABLET | Freq: Once | ORAL | Status: AC
  Administered 2015-01-08: 0.25 mg via ORAL

## 2015-01-08 NOTE — Progress Notes (Signed)
Utilization Review Completed.  

## 2015-01-08 NOTE — Progress Notes (Signed)
[facility] TEAM 1 - Stepdown/ICU TEAM Progress Note  DALEIGH DIKEMAN BJY:[id number] DOB: [birth year] DOA: 01/07/2015 PCP: Tana Conch, MD  Admit HPI / Brief Narrative: 81 y.o. female who lives in an ALF with history of obstructive sleep apnea on CPAP, hypothyroidism, hypertension, GERD, and breast cancer who presented with fever and chills.  She had recently been diagnosed with infection of the pannus and was placed on Diflucan.  She started noting fatigue and then started having chills which persisted.    HPI/Subjective: The patient states she does not feel much better at this time.  She denies nausea or vomiting chest pain or shortness of breath but feels severely fatigued in general.  She has very poor appetite.  Assessment/Plan:  Sepsis due to E coli UTI Continue empiric antibiotic coverage but narrow spectrum - follow up culture for sensitivities  ?Cellulitis of pannus - intertrigo Appears most consistent with a fungal dermatitis at this time - minimize moisture - anti-fungal powder - follow clinically  Hypokalemia  Due to poor oral intake - follow trend with replacement  HTN Not an active issue at the present time - holding usual blood pressure medications  Sleep apnea Resume home CPAP regimen QHS  Hypothyroidism  Continue home treatment regimen  Morbid obesity - Body mass index is 52.73 kg/(m^2).  Code Status: FULL Family Communication: no family present at time of exam Disposition Plan: SDU  Consultants: none  Procedures: none  Antibiotics: Vanc 7/21 > 7/22 Zosyn 7/21 > 7/22 Doxycycline 7/21 > 7/22 Rocephin 7/22 >  DVT prophylaxis: lovenox   Objective: Blood pressure 106/54, pulse 99, temperature 98.7 F (37.1 C), temperature source Oral, resp. rate 24, weight 139.4 kg (307 lb 5.1 oz), SpO2 94 %.  Intake/Output Summary (Last 24 hours) at 01/08/15 0824 Last data filed at 01/08/15 0700  Gross per 24 hour  Intake 3093.33 ml  Output   1000 ml    Net 2093.33 ml   Exam: General: No acute respiratory distress - alert  Lungs: Clear to auscultation bilaterally without wheezes or crackles - distant bs related to body habitus  Cardiovascular: Regular rate and rhythm without murmur gallop or rub- distant HS  Abdomen: Nontender, morbidly obese, soft, bowel sounds positive, no rebound, no ascites, no appreciable mass Extremities: No significant cyanosis, clubbing, or edema bilateral lower extremities Cutaneous:  Intertriginous rash below panus B  Data Reviewed: Basic Metabolic Panel:  Recent Labs Lab 01/07/15 1728 01/08/15 0505  NA 137 137  K 3.3* 2.8*  CL 101 103  CO2 26 26  GLUCOSE 128* 135*  BUN 10 6  CREATININE 0.98 0.84  CALCIUM 8.1* 7.3*    CBC:  Recent Labs Lab 01/07/15 1728 01/08/15 0505  WBC 9.0 8.9  NEUTROABS 7.2 7.3  HGB 12.9 11.5*  HCT 38.4 34.9*  MCV 89.7 90.2  PLT 172 157    Liver Function Tests:  Recent Labs Lab 01/07/15 1728 01/08/15 0505  AST 19 24  ALT 17 21  ALKPHOS 76 73  BILITOT 0.9 1.0  PROT 6.1* 5.6*  ALBUMIN 2.9* 2.4*   Coags:  Recent Labs Lab 01/07/15 1728 01/08/15 0505  INR 1.19 1.18    Recent Results (from the past 240 hour(s))  Gram stain     Status: None   Collection Time: 01/07/15  5:50 PM  Result Value Ref Range Status   Specimen Description URINE, CATHETERIZED  Final   Special Requests NONE  Final   Gram Stain   Final    WBC  PRESENT,BOTH PMN AND MONONUCLEAR GRAM NEGATIVE RODS CRITICAL RESULT CALLED TO, READ BACK BY AND VERIFIED WITH: M.BROWING, RN 01/07/15 @2125  BY V.WILKINS CYTOSPIN CONFIRMED BY M.SHIPMAN    Report Status 01/08/2015 FINAL  Final  MRSA PCR Screening     Status: None   Collection Time: 01/08/15 12:30 AM  Result Value Ref Range Status   MRSA by PCR NEGATIVE NEGATIVE Final    Comment:        The GeneXpert MRSA Assay (FDA approved for NASAL specimens only), is one component of a comprehensive MRSA colonization surveillance program. It  is not intended to diagnose MRSA infection nor to guide or monitor treatment for MRSA infections.      Studies:   Recent x-ray studies have been reviewed in detail by the Attending Physician  Scheduled Meds:  Scheduled Meds: . aspirin EC  81 mg Oral Daily  . doxycycline (VIBRAMYCIN) IV  100 mg Intravenous Q12H  . enoxaparin (LOVENOX) injection  60 mg Subcutaneous QHS  . famotidine (PEPCID) IV  20 mg Intravenous Q12H  . levothyroxine  200 mcg Oral QAC breakfast  . miconazole nitrate   Topical BID  . piperacillin-tazobactam (ZOSYN)  IV  3.375 g Intravenous 3 times per day  . potassium chloride  40 mEq Oral Once  . sodium chloride  3 mL Intravenous Q12H    Time spent on care of this patient: 35 mins   Reily Ilic T , MD   [facility] Office  [phone] Pager - Text Page per Loretha Stapler as per below:  On-Call/Text Page:      [email or web address]      password [email or web address]  If 7PM-7AM, please contact night-coverage [email or web address] Password [email or web address] 01/08/2015, 8:24 AM   LOS: 1 day

## 2015-01-08 NOTE — Progress Notes (Signed)
Patient has refused to wear her CPAP tonight. States mask is too big. RT offered a different type and smaller size. Patient still refused. Rt will continue to monitor as needed.

## 2015-01-08 NOTE — Procedures (Signed)
Pt placed on hospital CPAP set to 8 cmH2O (per home setting) with 2L of oxygen added. Pt is resting well at this time.

## 2015-01-09 DIAGNOSIS — I272 Pulmonary hypertension, unspecified: Secondary | ICD-10-CM | POA: Diagnosis present

## 2015-01-09 DIAGNOSIS — A419 Sepsis, unspecified organism: Secondary | ICD-10-CM

## 2015-01-09 DIAGNOSIS — I10 Essential (primary) hypertension: Secondary | ICD-10-CM

## 2015-01-09 DIAGNOSIS — I270 Primary pulmonary hypertension: Secondary | ICD-10-CM

## 2015-01-09 DIAGNOSIS — G4733 Obstructive sleep apnea (adult) (pediatric): Secondary | ICD-10-CM

## 2015-01-09 DIAGNOSIS — I9589 Other hypotension: Secondary | ICD-10-CM | POA: Diagnosis present

## 2015-01-09 DIAGNOSIS — L03311 Cellulitis of abdominal wall: Secondary | ICD-10-CM

## 2015-01-09 DIAGNOSIS — N390 Urinary tract infection, site not specified: Secondary | ICD-10-CM

## 2015-01-09 DIAGNOSIS — Z9989 Dependence on other enabling machines and devices: Secondary | ICD-10-CM

## 2015-01-09 DIAGNOSIS — E038 Other specified hypothyroidism: Secondary | ICD-10-CM

## 2015-01-09 LAB — COMPREHENSIVE METABOLIC PANEL
ALT: 29 U/L (ref 14–54)
AST: 28 U/L (ref 15–41)
Albumin: 2.3 g/dL — ABNORMAL LOW (ref 3.5–5.0)
Alkaline Phosphatase: 73 U/L (ref 38–126)
Anion gap: 7 (ref 5–15)
BILIRUBIN TOTAL: 0.6 mg/dL (ref 0.3–1.2)
BUN: 6 mg/dL (ref 6–20)
CALCIUM: 7.1 mg/dL — AB (ref 8.9–10.3)
CHLORIDE: 104 mmol/L (ref 101–111)
CO2: 27 mmol/L (ref 22–32)
CREATININE: 1.06 mg/dL — AB (ref 0.44–1.00)
GFR, EST AFRICAN AMERICAN: 55 mL/min — AB (ref >60)
GFR, EST NON AFRICAN AMERICAN: 48 mL/min — AB (ref >60)
GLUCOSE: 105 mg/dL — AB (ref 65–99)
Potassium: 3.8 mmol/L (ref 3.5–5.1)
Sodium: 138 mmol/L (ref 135–145)
Total Protein: 5.6 g/dL — ABNORMAL LOW (ref 6.5–8.1)

## 2015-01-09 LAB — CBC
HEMATOCRIT: 34.2 % — AB (ref 36.0–46.0)
Hemoglobin: 10.9 g/dL — ABNORMAL LOW (ref 12.0–15.0)
MCH: 29.4 pg (ref 26.0–34.0)
MCHC: 31.9 g/dL (ref 30.0–36.0)
MCV: 92.2 fL (ref 78.0–100.0)
PLATELETS: 153 10*3/uL (ref 150–400)
RBC: 3.71 MIL/uL — ABNORMAL LOW (ref 3.87–5.11)
RDW: 14.5 % (ref 11.5–15.5)
WBC: 8 10*3/uL (ref 4.0–10.5)

## 2015-01-09 LAB — URINE CULTURE: Culture: >=100000

## 2015-01-09 NOTE — Evaluation (Addendum)
Physical Therapy Evaluation Patient Details Name: Tamara Morales MRN: [id number] DOB: 08/24/1930 Today's Date: 01/09/2015   History of Present Illness  Tamara Morales is a 81 y.o. female with Past medical history of obstructive sleep apnea on C Pap, hypothyroidism, essential hypertension, GERD, breast cancer. Presented with fever, chills, sepsis due to UTI  Clinical Impression  Pt pleasant with limited baseline function of grossly 50' ambulation. Pt from [facility] and states she had HHPT grossly 9 months ago, feels she has declined since that time and is agreeable to attempt an additional time to maximize function. Pt educated for HEP, transfers and recommendation for continued RW use in home. Pt with sats dropping to 80% with 20' of gait with return to 2L at 98% at rest. Pt will benefit from acute therapy to maximize mobility, gait, strength and function to decrease fall risk.     Follow Up Recommendations Home health PT    Equipment Recommendations  None recommended by PT    Recommendations for Other Services       Precautions / Restrictions Precautions Precautions: Fall Precaution Comments: watch sats      Mobility  Bed Mobility Overal bed mobility: Modified Independent             General bed mobility comments: with rail and increased time  Transfers Overall transfer level: Needs assistance   Transfers: Sit to/from Stand Sit to Stand: Supervision         General transfer comment: cues for hand placement with assist for line management  Ambulation/Gait Ambulation/Gait assistance: Min guard Ambulation Distance (Feet): 20 Feet Assistive device: Rolling walker (2 wheeled) Gait Pattern/deviations: Step-through pattern;Decreased stride length   Gait velocity interpretation: Below normal speed for age/gender General Gait Details: cues for posture, position in RW and breathing technique as sats dropping to 80% on RA with activity  Stairs             Wheelchair Mobility    Modified Rankin (Stroke Patients Only)       Balance Overall balance assessment: Needs assistance   Sitting balance-Leahy Scale: Fair       Standing balance-Leahy Scale: Poor                               Pertinent Vitals/Pain Pain Assessment: No/denies pain      Home Living Family/patient expects to be discharged to:: Private residence Living Arrangements: Alone   Type of Home: Independent living facility       Home Layout: One level Home Equipment: Environmental consultant - 4 wheels;Walker - 2 wheels;Electric scooter;Shower seat;Cane - single point      Prior Function Level of Independence: Independent with assistive device(s)         Comments: pt uses 2 canes in the apartment and scooter for any distance >50'     Hand Dominance        Extremity/Trunk Assessment   Upper Extremity Assessment: Generalized weakness           Lower Extremity Assessment: RLE deficits/detail;LLE deficits/detail RLE Deficits / Details: bil hip flexion 3/5, knee flexion and extension 4+/5, edema with body habitus limiting ROM LLE Deficits / Details: bil hip flexion 3/5, knee flexion and extension 4+/5, edema with body habitus limiting ROM  Cervical / Trunk Assessment: Kyphotic  Communication   Communication: No difficulties  Cognition Arousal/Alertness: Awake/alert Behavior During Therapy: WFL for tasks assessed/performed Overall Cognitive Status: Within Functional Limits for  tasks assessed                      General Comments      Exercises General Exercises - Lower Extremity Hip Flexion/Marching: AROM;Seated;Both;10 reps      Assessment/Plan    PT Assessment Patient needs continued PT services  PT Diagnosis Difficulty walking;Generalized weakness   PT Problem List Decreased strength;Decreased activity tolerance;Decreased balance;Decreased knowledge of use of DME;Cardiopulmonary status limiting activity;Decreased mobility   PT Treatment Interventions Gait training;DME instruction;Functional mobility training;Therapeutic activities;Therapeutic exercise;Patient/family education   PT Goals (Current goals can be found in the Care Plan section) Acute Rehab PT Goals Patient Stated Goal: be able to move better PT Goal Formulation: With patient Time For Goal Achievement: 01/23/15 Potential to Achieve Goals: Fair    Frequency Min 3X/week   Barriers to discharge Decreased caregiver support      Co-evaluation               End of Session   Activity Tolerance: Patient tolerated treatment well Patient left: in chair;with call bell/phone within reach;with chair alarm set Nurse Communication: Mobility status         Time: 1497-0263 PT Time Calculation (min) (ACUTE ONLY): 22 min   Charges:   PT Evaluation $Initial PT Evaluation Tier I: 1 Procedure     PT G CodesMelford Aase 01/09/2015, 9:25 AM Elwyn Reach, [place]

## 2015-01-09 NOTE — Progress Notes (Signed)
01/09/15 Patient to transferring from [facility],Dx of Sepsis, Full Code,High fall,from [facility] independent side,Diet regular,Telemetry # [id number],VS routine,CPAP at night,BM 01/09/15,VTE Lovenox, and oxygen at 2-3 Liters.

## 2015-01-09 NOTE — Progress Notes (Signed)
[place] TEAM 1 - Stepdown/ICU TEAM Progress Note  Tamara Morales:[id number] DOB: 1930-08-10 DOA: 01/07/2015 PCP: Tana Conch, MD  Admit HPI / Brief Narrative: 81 y.o. female with PMHx DEPRESSION, chronic respiratory failure on 2 L O2 via [place], OSA on CPAP, Hypothyroidism, Essential HTN, HLD, Rt Breast Cancer S/P right lumpectomy and XRT. The patient is presenting with numbness of fever and chills. Recently diagnosed with infection of the pannus and was placed on Diflucan and she has taken one tablet 3 days ago. She started having fever and fatigue and then started having chills on Wednesday night. She remained having fatigue as well as malaise throughout the day on Thursday and decided to come to the hospital as she was having fever again.  The patient is coming from [facility]  ALF.  At her baseline ambulates with walker And is independent for most of her ADL manages her medication on her own.   HPI/Subjective: 7/23 A/O 4, NAD, still with some fatigue. States has very limited mobility at baseline, uses motorized wheelchair to get around at ALF.  Assessment/Plan: Sepsis due to E coli UTI -Continue empiric antibiotic coverage but narrow spectrum - follow up culture for sensitivities  ?Cellulitis of pannus - intertrigo -Appears most consistent with a fungal dermatitis at this time  - minimize moisture/continue miconazole anti-fungal powder   Acute on chronic respiratory failure with hypoxia -Patient requires 2 L O2 via [place]; currently on 4 L O2 via [place] to maintain SPO2 89-93%. -Titrate O2 to maintain SPO2 89-93%. -Ambulatory SPO2 -Consult to social work to ensure patient has appropriate equipment from [facility] i.e. portable oxygen concentrator.  Sleep apnea -Resume home CPAP regimen QHS  Hypokalemia  -Due to poor oral intake - follow trend with replacement  HTN/hypotension -Patient hypotensive for an 81 year old. Which is probably playing into her  fatigue along with the sepsis.  -Patient's previous echocardiogram 03/2013  -Repeat echocardiogram -Continue normal saline at 145ml/hr  Pulmonary hypertension - Currently patient's BP unable to tolerate normal medication for pulmonary hypertension.  Hypothyroidism  -7/11 TSH = 7.5  -Synthroid was increased to 200 g by Dr. Netta Corrigan; patient will need to follow-up in 6-8 weeks for redraw of TSH  Morbid obesity - Body mass index is 52.73 kg/(m^2).   Code Status: FULL Family Communication: no family present at time of exam Disposition Plan: Discharge in next 24-48 hours    Consultants:   Procedure/Significant Events: 04/01/13 echocardiogram; LVEF= 55% to 60%. - Pulmonary arteries: PA peak pressure: 43mm Hg (S).   Culture 7/21 blood left hand/Antecubital NGTD 7/21 urine positive Escherichia coli 7/22 MRSA by PCR negative   Antibiotics: Vanc 7/21 > stopped 7/22 Zosyn 7/21 > stopped 7/22 Doxycycline 7/21 > stopped 7/22 Rocephin 7/22 >  DVT prophylaxis:  Lovenox   Devices    LINES / TUBES:      Continuous Infusions: . sodium chloride 100 mL/hr at 01/09/15 0954    Objective: VITAL SIGNS: Temp: 98.3 F (36.8 C) (07/23 0800) Temp Source: Oral (07/23 0800) BP: 104/58 mmHg (07/23 0328) Pulse Rate: 76 (07/23 0328) SPO2; 91% on 4 L/m FIO2:    Intake/Output Summary (Last 24 hours) at 01/09/15 1123 Last data filed at 01/09/15 0200  Gross per 24 hour  Intake   1950 ml  Output    550 ml  Net   1400 ml     Exam: General:  A/O 4, NAD, acute on chronic respiratory distress Eyes: Negative headache, eye pain, double vision,  negative scleral hemorrhage ENT: Negative Runny nose, negative ear pain, negative tinnitus, negative gingival bleeding Neck:  Negative scars, masses, torticollis, lymphadenopathy, JVD Lungs:  diffuse decreased breath sounds, with mild expiratory wheezing, negative  crackles Cardiovascular: Regular rate and rhythm without murmur  gallop or rub normal S1 and S2 Abdomen:negative abdominal pain, negative dysphagia, Nontender, nondistended, soft, bowel sounds positive, no rebound, no ascites, no appreciable mass Extremities: No significant cyanosis, clubbing, or edema bilateral lower extremities Psychiatric:  Negative depression, negative anxiety, negative fatigue, negative mania  Neurologic:  Cranial nerves II through XII intact, tongue/uvula midline, all extremities muscle strength 5/5, sensation intact throughout,  negative dysarthria, negative expressive aphasia, negative receptive aphasia.      Data Reviewed: Basic Metabolic Panel:  Recent Labs Lab 01/07/15 1728 01/08/15 0505 01/09/15 0219  NA 137 137 138  K 3.3* 2.8* 3.8  CL 101 103 104  CO2 26 26 27   GLUCOSE 128* 135* 105*  BUN 10 6 6   CREATININE 0.98 0.84 1.06*  CALCIUM 8.1* 7.3* 7.1*   Liver Function Tests:  Recent Labs Lab 01/07/15 1728 01/08/15 0505 01/09/15 0219  AST 19 24 28   ALT 17 21 29   ALKPHOS 76 73 73  BILITOT 0.9 1.0 0.6  PROT 6.1* 5.6* 5.6*  ALBUMIN 2.9* 2.4* 2.3*   No results for input(s): LIPASE, AMYLASE in the last 168 hours. No results for input(s): AMMONIA in the last 168 hours. CBC:  Recent Labs Lab 01/07/15 1728 01/08/15 0505 01/09/15 0219  WBC 9.0 8.9 8.0  NEUTROABS 7.2 7.3  --   HGB 12.9 11.5* 10.9*  HCT 38.4 34.9* 34.2*  MCV 89.7 90.2 92.2  PLT 172 157 153   Cardiac Enzymes: No results for input(s): CKTOTAL, CKMB, CKMBINDEX, TROPONINI in the last 168 hours. BNP (last 3 results)  Recent Labs  11/03/14 0638 12/21/14 0940 01/07/15 1745  BNP 63.6 74.2 149.7*    ProBNP (last 3 results) No results for input(s): PROBNP in the last 8760 hours.  CBG: No results for input(s): GLUCAP in the last 168 hours.  Recent Results (from the past 240 hour(s))  Blood Culture (routine x 2)     Status: None (Preliminary result)   Collection Time: 01/07/15  5:28 PM  Result Value Ref Range Status   Specimen  Description BLOOD LEFT HAND  Final   Special Requests BOTTLES DRAWN AEROBIC AND ANAEROBIC 5CC  Final   Culture NO GROWTH < 24 HOURS  Final   Report Status PENDING  Incomplete  Gram stain     Status: None   Collection Time: 01/07/15  5:50 PM  Result Value Ref Range Status   Specimen Description URINE, CATHETERIZED  Final   Special Requests NONE  Final   Gram Stain   Final    WBC PRESENT,BOTH PMN AND MONONUCLEAR GRAM NEGATIVE RODS CRITICAL RESULT CALLED TO, READ BACK BY AND VERIFIED WITH: M.BROWING, RN 01/07/15 @2125  BY V.WILKINS CYTOSPIN CONFIRMED BY M.SHIPMAN    Report Status 01/08/2015 FINAL  Final  Urine culture     Status: None   Collection Time: 01/07/15  5:51 PM  Result Value Ref Range Status   Specimen Description URINE, CATHETERIZED  Final   Special Requests NONE  Final   Culture >=100,000 COLONIES/mL ESCHERICHIA COLI  Final   Report Status 01/09/2015 FINAL  Final   Organism ID, Bacteria ESCHERICHIA COLI  Final      Susceptibility   Escherichia coli - MIC*    AMPICILLIN >=32 RESISTANT Resistant  CEFAZOLIN <=4 SENSITIVE Sensitive     CEFTRIAXONE <=1 SENSITIVE Sensitive     CIPROFLOXACIN <=0.25 SENSITIVE Sensitive     GENTAMICIN >=16 RESISTANT Resistant     IMIPENEM <=0.25 SENSITIVE Sensitive     NITROFURANTOIN <=16 SENSITIVE Sensitive     TRIMETH/SULFA >=320 RESISTANT Resistant     AMPICILLIN/SULBACTAM 16 INTERMEDIATE Intermediate     PIP/TAZO <=4 SENSITIVE Sensitive     * >=100,000 COLONIES/mL ESCHERICHIA COLI  Blood Culture (routine x 2)     Status: None (Preliminary result)   Collection Time: 01/07/15  5:55 PM  Result Value Ref Range Status   Specimen Description BLOOD LEFT ANTECUBITAL  Final   Special Requests BOTTLES DRAWN AEROBIC AND ANAEROBIC 5CC  Final   Culture NO GROWTH < 24 HOURS  Final   Report Status PENDING  Incomplete  MRSA PCR Screening     Status: None   Collection Time: 01/08/15 12:30 AM  Result Value Ref Range Status   MRSA by PCR NEGATIVE  NEGATIVE Final    Comment:        The GeneXpert MRSA Assay (FDA approved for NASAL specimens only), is one component of a comprehensive MRSA colonization surveillance program. It is not intended to diagnose MRSA infection nor to guide or monitor treatment for MRSA infections.      Studies:  Recent x-ray studies have been reviewed in detail by the Attending Physician  Scheduled Meds:  Scheduled Meds: . aspirin EC  81 mg Oral Daily  . cefTRIAXone (ROCEPHIN)  IV  1 g Intravenous Q24H  . enoxaparin (LOVENOX) injection  60 mg Subcutaneous QHS  . famotidine  20 mg Oral BID  . levothyroxine  200 mcg Oral QAC breakfast  . miconazole nitrate   Topical TID  . phenazopyridine  100 mg Oral Daily  . sodium chloride  3 mL Intravenous Q12H    Time spent on care of this patient: 40 mins   Ariyanna Oien, Roselind Messier , MD  [facility] Office  [phone] Pager [phone]  On-Call/Text Page:      [email or web address]      password [email or web address]  If 7PM-7AM, please contact night-coverage [email or web address] Password [facility] 01/09/2015, 11:23 AM   LOS: 2 days   Care during the described time interval was provided by me .  I have reviewed this patient's available data, including medical history, events of note, physical examination, and all test results as part of my evaluation. I have personally reviewed and interpreted all radiology studies.   Carolyne Littles, MD [phone] Pager

## 2015-01-09 NOTE — Progress Notes (Signed)
Patient taken off CPAP and placed on NRB 15L. Sats dropped to 82. Patient came up to 98%. RT placed patient back on CPAP Auto 24max and 58min. Patient tolerating well. Sat 96%. RT will continue to monitor as needed.

## 2015-01-09 NOTE — Procedures (Signed)
Pt does not want to wear cpap machine, RT will continue to monitor.

## 2015-01-09 NOTE — Progress Notes (Signed)
Patient placed on CPAP of 8. Patient sats dropped down to 88%. Patient stats now 94% on 6L. RT will continue to monitor as needed.

## 2015-01-10 DIAGNOSIS — A4151 Sepsis due to Escherichia coli [E. coli]: Principal | ICD-10-CM

## 2015-01-10 DIAGNOSIS — N390 Urinary tract infection, site not specified: Secondary | ICD-10-CM | POA: Diagnosis present

## 2015-01-10 DIAGNOSIS — B9620 Unspecified Escherichia coli [E. coli] as the cause of diseases classified elsewhere: Secondary | ICD-10-CM | POA: Diagnosis present

## 2015-01-10 DIAGNOSIS — J9621 Acute and chronic respiratory failure with hypoxia: Secondary | ICD-10-CM

## 2015-01-10 DIAGNOSIS — E876 Hypokalemia: Secondary | ICD-10-CM | POA: Diagnosis present

## 2015-01-10 LAB — COMPREHENSIVE METABOLIC PANEL
ALK PHOS: 75 U/L (ref 38–126)
ALT: 24 U/L (ref 14–54)
AST: 17 U/L (ref 15–41)
Albumin: 2.2 g/dL — ABNORMAL LOW (ref 3.5–5.0)
Anion gap: 6 (ref 5–15)
BILIRUBIN TOTAL: 0.3 mg/dL (ref 0.3–1.2)
BUN: 8 mg/dL (ref 6–20)
CHLORIDE: 107 mmol/L (ref 101–111)
CO2: 27 mmol/L (ref 22–32)
CREATININE: 0.73 mg/dL (ref 0.44–1.00)
Calcium: 7.4 mg/dL — ABNORMAL LOW (ref 8.9–10.3)
GFR calc Af Amer: >60 mL/min (ref >60)
Glucose, Bld: 106 mg/dL — ABNORMAL HIGH (ref 65–99)
POTASSIUM: 3.7 mmol/L (ref 3.5–5.1)
Sodium: 140 mmol/L (ref 135–145)
Total Protein: 5.6 g/dL — ABNORMAL LOW (ref 6.5–8.1)

## 2015-01-10 LAB — CBC WITH DIFFERENTIAL/PLATELET
BASOS ABS: 0 10*3/uL (ref 0.0–0.1)
BASOS PCT: 0 % (ref 0–1)
EOS ABS: 0.2 10*3/uL (ref 0.0–0.7)
Eosinophils Relative: 3 % (ref 0–5)
HCT: 33.8 % — ABNORMAL LOW (ref 36.0–46.0)
HEMOGLOBIN: 10.7 g/dL — AB (ref 12.0–15.0)
Lymphocytes Relative: 19 % (ref 12–46)
Lymphs Abs: 1.1 10*3/uL (ref 0.7–4.0)
MCH: 29.5 pg (ref 26.0–34.0)
MCHC: 31.7 g/dL (ref 30.0–36.0)
MCV: 93.1 fL (ref 78.0–100.0)
Monocytes Absolute: 0.5 10*3/uL (ref 0.1–1.0)
Monocytes Relative: 8 % (ref 3–12)
NEUTROS PCT: 70 % (ref 43–77)
Neutro Abs: 4.2 10*3/uL (ref 1.7–7.7)
PLATELETS: 155 10*3/uL (ref 150–400)
RBC: 3.63 MIL/uL — ABNORMAL LOW (ref 3.87–5.11)
RDW: 14.4 % (ref 11.5–15.5)
WBC: 6 10*3/uL (ref 4.0–10.5)

## 2015-01-10 LAB — MAGNESIUM: Magnesium: 1.8 mg/dL (ref 1.7–2.4)

## 2015-01-10 MED ORDER — CEFUROXIME AXETIL 500 MG PO TABS
500.0000 mg | ORAL_TABLET | Freq: Two times a day (BID) | ORAL | Status: DC
  Filled 2015-01-10 (×2): qty 1

## 2015-01-10 MED ORDER — CEFUROXIME AXETIL 500 MG PO TABS: 500.0000 mg | ORAL_TABLET | Freq: Two times a day (BID) | ORAL | Status: DC

## 2015-01-10 NOTE — Discharge Summary (Signed)
Physician Discharge Summary  LONIE RUMMELL VEH:[id number] DOB: 10-18-1930 DOA: 01/07/2015  PCP: Garret Reddish, MD  Admit date: 01/07/2015 Discharge date: 01/10/2015  Time spent: 90 minutes  Recommendations for Outpatient Follow-up:  Sepsis due to E coli UTI -Complete 5 day course of antibiotics with Ceftin.  -Follow-up in 7-14 days with PCP Dr. Garret Reddish  Cellulitis of pannus - intertrigo -Appears most consistent with a fungal dermatitis at this time  - minimize moisture/continue miconazole anti-fungal powder   Acute on chronic respiratory failure with hypoxia -Patient requires 2 L O2 via [place]; currently on 4 L O2 via [place] to maintain SPO2 89-93%. -Titrate O2 to maintain SPO2 89-93%. -SATURATION QUALIFICATIONS: (This note is used to comply with regulatory documentation for home oxygen) Patient Saturations on Room Air at Rest = 79 to 84% Patient Saturations on Room Air while Ambulating = patient unable to perform secondary to hypoxia when oxygen was removed. Patient Saturations on 2 Liters of oxygen while Ambulating = 86%. While walking she was 90% then decrease to 84% to 86%. Please briefly explain why patient needs home oxygen: patient was hypoxic while ambulating and at rest.  -Patient [facility].  -Patient will require 24-hour 02. 2 L O2 via [place] when sedentary, and 3 L O2 via [place] upon exertion.  Sleep apnea -Resume home CPAP regimen QHS  Hypokalemia  -Resolved   HTN/hypotension -Resolved -Follow-up with PCP in 7-14 days  Pulmonary hypertension - Currently patient's BP unable to tolerate normal medication for pulmonary hypertension. If patient's BP increases significantly would recommend afterload reducing agent (Imdur)  Hypothyroidism  -7/11 TSH = 7.5  -Synthroid was increased to 200 g by Dr. Meyer Russel; patient will need to follow-up in 6-8 weeks for redraw of TSH  Morbid obesity - Body  mass index is 52.73 kg/(m^2). -Patient to have physical therapy with [facility] which is next door/part of [facility]      Discharge Diagnoses:  Principal Problem:   Sepsis Active Problems:   Hypothyroidism   Morbid obesity   Essential hypertension   GERD   OSA (obstructive sleep apnea)   Cellulitis   UTI (urinary tract infection)   Sepsis secondary to UTI   Cellulitis of abdominal wall   OSA on CPAP   Other specified hypotension   Pulmonary hypertension   Other specified hypothyroidism   Sepsis due to Escherichia coli   Escherichia coli urinary tract infection   Acute on chronic respiratory failure with hypoxia   Hypokalemia   Discharge Condition: Stable   Diet recommendation: Heart healthy  Filed Weights   01/09/15 0156 01/09/15 1427 01/10/15 0540  Weight: 139.7 kg (307 lb 15.7 oz) 140 kg (308 lb 10.3 oz) 141.2 kg (311 lb 4.6 oz)    81 y.o. female with PMHx DEPRESSION, chronic respiratory failure on 2 L O2 via [place], OSA on CPAP, Hypothyroidism, Essential HTN, HLD, Rt Breast Cancer S/P right lumpectomy and XRT. The patient is presenting with numbness of fever and chills. Recently diagnosed with infection of the pannus and was placed on Diflucan and she has taken one tablet 3 days ago. She started having fever and fatigue and then started having chills on Wednesday night. She remained having fatigue as well as malaise throughout the day on Thursday and decided to come to the hospital as she was having fever again. The patient is coming from [place].  At her baseline ambulates  with walker And is independent for most of her ADL manages her medication on her own. During his hospitalization patient was diagnosed with acute on chronic respiratory failure in sepsis secondary to Escherichia coli urinary tract infection. Patient was started on ceftriaxone and will complete a 5 day course of antibiotics with Ceftin 7/24 A/O 4, although  negative leukocytosis, afebrile, patient states she just feels weak and wants to stay in the hospital.     Procedure/Significant Events: 04/01/13 echocardiogram; LVEF= 55% to 60%. - Pulmonary arteries: PA peak pressure: 33mm Hg (S).   Culture 7/21 blood left hand/Antecubital NGTD 7/21 urine positive Escherichia coli 7/22 MRSA by PCR negative   Antibiotics: Vanc 7/21 > stopped 7/22 Zosyn 7/21 > stopped 7/22 Doxycycline 7/21 > stopped 7/22 Rocephin 7/22 > stopped 7/24 Ceftin 7/24>> 7/27    Discharge Exam: Filed Vitals:   01/09/15 1300 01/09/15 1427 01/09/15 2049 01/10/15 0540  BP: 104/55 121/49 124/82 126/56  Pulse: 87 95 104 87  Temp:  98.3 F (36.8 C) 98.4 F (36.9 C) 98.4 F (36.9 C)  TempSrc:  Oral Oral Oral  Resp: $Remo'25  20 24  'qUEXL$ Height:  5' (1.524 m)    Weight:  140 kg (308 lb 10.3 oz)  141.2 kg (311 lb 4.6 oz)  SpO2: 97% 100% 100% 96%    General: A/O 4, NAD, acute on chronic respiratory distress Eyes: Negative headache, eye pain, double vision, negative scleral hemorrhage ENT: Negative Runny nose, negative ear pain, negative tinnitus, negative gingival bleeding Neck: Negative scars, masses, torticollis, lymphadenopathy, JVD Lungs: diffuse decreased breath sounds, with mild expiratory wheezing, negative crackles Cardiovascular: Regular rate and rhythm without murmur gallop or rub normal S1 and S2 Abdomen:negative abdominal pain, negative dysphagia, Nontender, nondistended, soft, bowel sounds positive, no rebound, no ascites, no appreciable mass    Discharge Instructions     Medication List    STOP taking these medications        fluconazole 150 MG tablet  Commonly known as:  DIFLUCAN     furosemide 40 MG tablet  Commonly known as:  LASIX     TRIBENZOR 20-5-12.5 MG Tabs  Generic drug:  Olmesartan-Amlodipine-HCTZ      TAKE these medications        ALPRAZolam 0.25 MG tablet  Commonly known as:  XANAX  TAKE 1/2 to 1 TABLET BY MOUTH TWICE A DAY AS  NEEDED FOR ANXIETY OR SLEEP     aspirin 81 MG tablet  Take 81 mg by mouth every morning.     AZO-TABS 95 MG tablet  Generic drug:  phenazopyridine  Take 95 mg by mouth daily.     cefUROXime 500 MG tablet  Commonly known as:  CEFTIN  Take 1 tablet (500 mg total) by mouth 2 (two) times daily with a meal.     FIBER PO  Take 2 tablets by mouth every morning.     Fish Oil 1200 MG Caps  Take 2,400 mg by mouth daily.     levothyroxine 200 MCG tablet  Commonly known as:  SYNTHROID  TAKE 1 TABLET BY MOUTH ONCE DAILY BEFORE BREAKFAST     loratadine 10 MG tablet  Commonly known as:  CLARITIN  Take 10 mg by mouth daily as needed for allergies.     nystatin 100000 UNIT/GM Powd  APPLY TO AFFECTED AREA TWICE A DAY     omeprazole 20 MG capsule  Commonly known as:  PRILOSEC  TAKE 1 CAPSULE (20 MG TOTAL) BY MOUTH 2 (TWO)  TIMES DAILY.     potassium chloride 10 MEQ CR capsule  Commonly known as:  MICRO-K  TAKE 2 CAPSULE BY MOUTH ONCE DAILY     senna-docusate 8.6-50 MG per tablet  Commonly known as:  Senokot-S  Take 2 tablets by mouth at bedtime as needed for mild constipation.       Allergies  Allergen Reactions  . Azithromycin Itching  . Beta Adrenergic Blockers     Depression   . Ciprofloxacin Other (See Comments)    REACTION: edgy and very jumpy   . Codeine Nausea And Vomiting  . Epinephrine Other (See Comments)    REACTION: rapid pulse, sweats  . Klonopin [Clonazepam] Other (See Comments)    Makes her feel very suicidal   . Oxycodone Other (See Comments)    Patient felt like it altered her mental status "Crazy" . Hallucinations later as well.  [name] Paxil [Paroxetine Hydrochloride] Other (See Comments)    Sever depression   . Prednisone     "makes me feel really bad"  . Propoxyphene Hcl Nausea And Vomiting  . Meloxicam Diarrhea  . Vancomycin Rash    Localized rash related to infusion rate.   Follow-up Information    Follow up with Garret Reddish, MD. Schedule an  appointment as soon as possible for a visit in 2 weeks.   Specialty:  Family Medicine   Why:  Follow-up with PCP in 7-14 days; UTI sepsis secondary to Escherichia coli; acute on chronic respiratory failure with hypoxia   Contact information:   [place]  [id number] [phone]        The results of significant diagnostics from this hospitalization (including imaging, microbiology, ancillary and laboratory) are listed below for reference.    Significant Diagnostic Studies: Dg Chest 2 View  01/07/2015   CLINICAL DATA:  Fever for 2 days.  EXAM: CHEST  2 VIEW  COMPARISON:  12/21/2014  FINDINGS: Mild cardiac enlargement. No pleural effusion or edema. No airspace consolidation identified. Chronic right posterior rib fracture deformities are again identified. There are surgical clips noted within the right axilla and right breast as before.  IMPRESSION: 1. No acute cardiopulmonary abnormalities. 2. Cardiac enlargement.   Electronically Signed   By: Kerby Moors M.D.   On: 01/07/2015 18:51   Dg Chest 2 View  12/21/2014   CLINICAL DATA:  Mid chest pain beginning last night. Chest tightness, shortness of breath, cough. Hypoxia.  EXAM: CHEST  2 VIEW  COMPARISON:  11/03/2014  FINDINGS: Heart is borderline in size. Chronic increased markings throughout the lungs, likely scarring. No acute airspace opacities or effusions. No acute bony abnormality. Old bilateral rib fractures noted, stable.  IMPRESSION: No active disease.   Electronically Signed   By: Rolm Baptise M.D.   On: 12/21/2014 10:28    Microbiology: Recent Results (from the past 240 hour(s))  Blood Culture (routine x 2)     Status: None (Preliminary result)   Collection Time: 01/07/15  5:28 PM  Result Value Ref Range Status   Specimen Description BLOOD LEFT HAND  Final   Special Requests BOTTLES DRAWN AEROBIC AND ANAEROBIC 5CC  Final   Culture NO GROWTH 3 DAYS  Final   Report Status PENDING  Incomplete  Gram stain      Status: None   Collection Time: 01/07/15  5:50 PM  Result Value Ref Range Status   Specimen Description URINE, CATHETERIZED  Final   Special Requests NONE  Final   Gram Stain  Final    WBC PRESENT,BOTH PMN AND MONONUCLEAR GRAM NEGATIVE RODS CRITICAL RESULT CALLED TO, READ BACK BY AND VERIFIED WITH: M.BROWING, RN 01/07/15 $RemoveBef'@2125'CUONllAZsa$  BY V.WILKINS CYTOSPIN CONFIRMED BY M.SHIPMAN    Report Status 01/08/2015 FINAL  Final  Urine culture     Status: None   Collection Time: 01/07/15  5:51 PM  Result Value Ref Range Status   Specimen Description URINE, CATHETERIZED  Final   Special Requests NONE  Final   Culture >=100,000 COLONIES/mL ESCHERICHIA COLI  Final   Report Status 01/09/2015 FINAL  Final   Organism ID, Bacteria ESCHERICHIA COLI  Final      Susceptibility   Escherichia coli - MIC*    AMPICILLIN >=32 RESISTANT Resistant     CEFAZOLIN <=4 SENSITIVE Sensitive     CEFTRIAXONE <=1 SENSITIVE Sensitive     CIPROFLOXACIN <=0.25 SENSITIVE Sensitive     GENTAMICIN >=16 RESISTANT Resistant     IMIPENEM <=0.25 SENSITIVE Sensitive     NITROFURANTOIN <=16 SENSITIVE Sensitive     TRIMETH/SULFA >=320 RESISTANT Resistant     AMPICILLIN/SULBACTAM 16 INTERMEDIATE Intermediate     PIP/TAZO <=4 SENSITIVE Sensitive     * >=100,000 COLONIES/mL ESCHERICHIA COLI  Blood Culture (routine x 2)     Status: None (Preliminary result)   Collection Time: 01/07/15  5:55 PM  Result Value Ref Range Status   Specimen Description BLOOD LEFT ANTECUBITAL  Final   Special Requests BOTTLES DRAWN AEROBIC AND ANAEROBIC 5CC  Final   Culture NO GROWTH 3 DAYS  Final   Report Status PENDING  Incomplete  MRSA PCR Screening     Status: None   Collection Time: 01/08/15 12:30 AM  Result Value Ref Range Status   MRSA by PCR NEGATIVE NEGATIVE Final    Comment:        The GeneXpert MRSA Assay (FDA approved for NASAL specimens only), is one component of a comprehensive MRSA colonization surveillance program. It is  not intended to diagnose MRSA infection nor to guide or monitor treatment for MRSA infections.      Labs: Basic Metabolic Panel:  Recent Labs Lab 01/07/15 1728 01/08/15 0505 01/09/15 0219 01/10/15 0500  NA 137 137 138 140  K 3.3* 2.8* 3.8 3.7  CL 101 103 104 107  CO2 $Re'26 26 27 27  'KBJ$ GLUCOSE 128* 135* 105* 106*  BUN $Re'10 6 6 8  'lWo$ CREATININE 0.98 0.84 1.06* 0.73  CALCIUM 8.1* 7.3* 7.1* 7.4*  MG  --   --   --  1.8   Liver Function Tests:  Recent Labs Lab 01/07/15 1728 01/08/15 0505 01/09/15 0219 01/10/15 0500  AST $Re'19 24 28 17  'RNM$ ALT $R'17 21 29 24  'Dl$ ALKPHOS 76 73 73 75  BILITOT 0.9 1.0 0.6 0.3  PROT 6.1* 5.6* 5.6* 5.6*  ALBUMIN 2.9* 2.4* 2.3* 2.2*   No results for input(s): LIPASE, AMYLASE in the last 168 hours. No results for input(s): AMMONIA in the last 168 hours. CBC:  Recent Labs Lab 01/07/15 1728 01/08/15 0505 01/09/15 0219 01/10/15 0500  WBC 9.0 8.9 8.0 6.0  NEUTROABS 7.2 7.3  --  4.2  HGB 12.9 11.5* 10.9* 10.7*  HCT 38.4 34.9* 34.2* 33.8*  MCV 89.7 90.2 92.2 93.1  PLT 172 157 153 155   Cardiac Enzymes: No results for input(s): CKTOTAL, CKMB, CKMBINDEX, TROPONINI in the last 168 hours. BNP: BNP (last 3 results)  Recent Labs  11/03/14 0638 12/21/14 0940 01/07/15 1745  BNP 63.6 74.2 149.7*    ProBNP (last  3 results) No results for input(s): PROBNP in the last 8760 hours.  CBG: No results for input(s): GLUCAP in the last 168 hours.     Signed:  Dia Crawford, MD [facility] [phone] pager

## 2015-01-10 NOTE — Progress Notes (Signed)
SATURATION QUALIFICATIONS: (This note is used to comply with regulatory documentation for home oxygen)  Patient Saturations on Room Air at Rest = 79 to 84%  Patient Saturations on Room Air while Ambulating = % did not do because was hypoxic when oxygen was removed.  Patient Saturations on 2 Liters of oxygen while Ambulating = 86%. While walking she was 90% then decrease to 84% to 86%.  Please briefly explain why patient needs home oxygen: patient was hypoxic while ambulating and at rest.

## 2015-01-10 NOTE — Care Management Note (Signed)
Case Management Note  Patient Details  Name: AMMIE WARRICK MRN: [id number] Date of Birth: Jul 18, 1930  Subjective/Objective:                   Sepsis due to E coli UTI  Action/Plan:  D/c back to independent living facility.  Expected Discharge Date:  01/10/15              Expected Discharge Plan:   (Back to [place])  In-House Referral:  Clinical Social Work  Discharge planning Services  CM Consult  Post Acute Care Choice:  Durable Medical Equipment, Home Health Choice offered to:   (Patient requested [facility] and DME agency that she has used in the past. )  DME Arranged:   (mini portable O2 concentrator) DME Agency:  [place]:    [place] Agency:     Status of Service:  In process, will continue to follow  Medicare Important Message Given:    Date Medicare IM Given:    Medicare IM give by:    Date Additional Medicare IM Given:    Additional Medicare Important Message give by:     If discussed at [place] of Stay Meetings, dates discussed:    Additional Comments:  11:00am Received verbal referral from CM regarding coordination of mini O2 concentrator delivery prior to d/c today.    Spoke with patient, stated she had an appointment with Merry Proud @ [place] [facility] store on 01/08/15 to pick up the mini O2 concentrator but was unable to do so because she was in the hospital.   States the concentrator was ordered by Dr. Halford Chessman [id number]) and already approved by her insurance company.   She had also planned to receive education on how to assemble her new CPAP mask during her appointment with [facility].   Advised patient that Dr. Sherral Hammers had asked CM to coordinate DME and home services for discharge.    CM coordinated mini O2 concentrator delivery, O2 concentrator equipment education and CPAP equipment education through [place] [phone])  @ [place].  Equipment delivery and education will take today at patient's  home after d/c from hospital.   Portable O2 delivered to hospital.    5:42pm TCT patient @ home, confirmed that she has spoken with Lytle Michaels @ [facility] and he is on his way to her house with equipment and to complete education.  Also spoke with Lytle Michaels @ [facility] and confirmed above.    Advised patient that CM asked covering CM Normand Sloop) to arrange [facility] services per MD order with her preferred provider on Monday 01/11/15, since [facility] agency is closed today.  Contact information for preferred [facility] provider:  [employer] [phone].   [place], no answer, voice message states office hours are Monday  -  Friday 8:00am - 4:00pm.   Left VM for [employer], advised that CM has a new referral and request call back to [facility] [phone]) on 01/11/15 for referral specifics, no PHI left on VM.   Updated covering CM on above, she will follow up on 01/11/15 and will follow up to complete [facility] referral.   She will also notified patient once referral has been completed.   Covering CM given a copy of  Patient's HH orders, facesheet, d/c summary, H&P,  Medicare face to face , for [facility] referral follow up.     Rainee Sweatt H. Burt Knack RN, BSN, CCM 01/10/2015, 6:26 PM

## 2015-01-10 NOTE — Social Work (Signed)
CSW contacted facility in order to clarify patient status and residence. Patient is from [facility] and lives in [place].    Therefore patient will not have any CSW needs at discharge.   Christene Lye MSW, [place]

## 2015-01-10 NOTE — Progress Notes (Signed)
01/10/15 Patient to be discharged home to [place], IV site removed ,Telemetry removed,discharge instructions reviewed with patient and family. [facility] to be called when leaving to go home.

## 2015-01-11 ENCOUNTER — Encounter: Payer: Self-pay | Admitting: Pulmonary Disease

## 2015-01-11 ENCOUNTER — Ambulatory Visit (INDEPENDENT_AMBULATORY_CARE_PROVIDER_SITE_OTHER): Payer: Medicare Other | Admitting: Pulmonary Disease

## 2015-01-11 VITALS — BP 142/100 | HR 87 | Ht 65.0 in | Wt 300.0 lb

## 2015-01-11 DIAGNOSIS — R0981 Nasal congestion: Secondary | ICD-10-CM | POA: Diagnosis not present

## 2015-01-11 DIAGNOSIS — G4733 Obstructive sleep apnea (adult) (pediatric): Secondary | ICD-10-CM

## 2015-01-11 DIAGNOSIS — E662 Morbid (severe) obesity with alveolar hypoventilation: Secondary | ICD-10-CM | POA: Diagnosis not present

## 2015-01-11 DIAGNOSIS — Z9989 Dependence on other enabling machines and devices: Secondary | ICD-10-CM

## 2015-01-11 MED ORDER — FLUTICASONE PROPIONATE 50 MCG/ACT NA SUSP: 2.0000 | Freq: Every day | NASAL | Status: DC

## 2015-01-11 NOTE — Progress Notes (Signed)
Chief Complaint  Patient presents with  . Acute Visit    Seen in [facility] for UTI, elevated BP and HR. Pt reports since being discharged from hospital has been more O2 dependent and SOB. Pt using CPAP nightly with 2L O2.     History of Present Illness: Tamara Morales is a 81 y.o. female with severe OSA using CPAP and 2 liters oxygen.  She was in hospital earlier this month with E coli UTI and sepsis.    She was told she has COPD even though she never had this dx before.  She was tried on nebulizer tx in hospital >> no help.  She has sinus congestion, post nasal drip, and throat irritation.  This triggers a cough.  She does not feel like she has anything in her chest.  Her CXR from in hospital was unremarkable.  She has been using oxygen more during the day since being in hospital.  She is using CPAP at night.   TESTS: PSG 01/22/11 >> AHI 74.3, SpO2 low 76%. ONO with RA and CPAP 04/21/11 >> Test time 5 hr 12 min. Basal SpO2 87%, low SpO2 73%. Spent 161 min with SpO2 < 88%. Echo 04/01/13 >> EF 55 to 60%, PAS 43 mmHg CPAP 05/11/12 to 08/08/12 >> used on 89 of 90 nights with average 4 hrs 38 min.  Average AHI 6.7 with CPAP 10 cm H2O. CPAP 10/11/12 to 01/08/13 >> Used on 88 of 90 nights with average 5 hrs 15 min. Average AHI 5.3 with CPAP 11 cm H2O. Auto CPAP 09/13/14 to 09/28/14 >> used on 13 of 16 nights with average 3 hrs and 24 min. Average AHI is 2.1 with median CPAP 7 cm H2O and 95 th percentile CPAP 11 cm H20. CT chest 11/03/14 >> atherosclerosis, patchy atelectasis, scoliosis, old Rt fib fx  PMHx >> HA, OA, Depression GERD, HLD, HTN, Breast cancer in 2000, Hypothyroidism  PSHx, Medications, Allergies, Fhx, Shx reviewed.   Physical Exam: BP 142/100 mmHg  Pulse 87  Ht $R'5\' 5"'VS$  (1.651 m)  Wt 300 lb (136.079 kg)  BMI 49.92 kg/m2  SpO2 94%  General - No distress ENT - No sinus tenderness, no oral exudate, no LAN Cardiac - s1s2 regular, no murmur Chest - No wheeze/rales/dullness Back -  No focal tenderness Abd - Soft, non-tender Ext - 1+ edema Neuro - Normal strength Skin - No rashes Psych - normal mood, and behavior   CMP Latest Ref Rng 01/10/2015 01/09/2015 01/08/2015  Glucose 65 - 99 mg/dL 106(H) 105(H) 135(H)  BUN 6 - 20 mg/dL $Remove'8 6 6  'pRjyajU$ Creatinine 0.44 - 1.00 mg/dL 0.73 1.06(H) 0.84  Sodium 135 - 145 mmol/L 140 138 137  Potassium 3.5 - 5.1 mmol/L 3.7 3.8 2.8(L)  Chloride 101 - 111 mmol/L 107 104 103  CO2 22 - 32 mmol/L $RemoveB'27 27 26  'IShqUZUg$ Calcium 8.9 - 10.3 mg/dL 7.4(L) 7.1(L) 7.3(L)  Total Protein 6.5 - 8.1 g/dL 5.6(L) 5.6(L) 5.6(L)  Total Bilirubin 0.3 - 1.2 mg/dL 0.3 0.6 1.0  Alkaline Phos 38 - 126 U/L 75 73 73  AST 15 - 41 U/L $Remo'17 28 24  'aNROh$ ALT 14 - 54 U/L $Remo'24 29 21    'IXojL$ CBC Latest Ref Rng 01/10/2015 01/09/2015 01/08/2015  WBC 4.0 - 10.5 K/uL 6.0 8.0 8.9  Hemoglobin 12.0 - 15.0 g/dL 10.7(L) 10.9(L) 11.5(L)  Hematocrit 36.0 - 46.0 % 33.8(L) 34.2(L) 34.9(L)  Platelets 150 - 400 K/uL 155 153 157    BNP  Component Value Date/Time   BNP 149.7* 01/07/2015 1745    Dg Chest 2 View  01/07/2015   CLINICAL DATA:  Fever for 2 days.  EXAM: CHEST  2 VIEW  COMPARISON:  12/21/2014  FINDINGS: Mild cardiac enlargement. No pleural effusion or edema. No airspace consolidation identified. Chronic right posterior rib fracture deformities are again identified. There are surgical clips noted within the right axilla and right breast as before.  IMPRESSION: 1. No acute cardiopulmonary abnormalities. 2. Cardiac enlargement.   Electronically Signed   By: Kerby Moors M.D.   On: 01/07/2015 18:51    Assessment/Plan:  Dyspnea. Her current worsening likely related to recent hospitalization with E coli UTI, sepsis, and atelectasis from laying in bed. Plan: - complete Abx from hospital - encouraged to increase activity as tolerated and take deep breaths intermittently through out the day  Obstructive sleep apnea. Plan: - continue auto CPAP  Obesity hypoventilation syndrome. Plan: - continue  2 liters oxygen at night with CPAP - she will check her pulse ox at home >> explained that she will need oxygen if her SpO2 is < 90%  Morbid obesity. Plan: - discussed options for assisting with weight loss  Upper airway cough syndrome. Explained there hasn't been concern for COPD >> she did not have benefit from nebulizer therapy in hospital. Plan: - will have her try nasal irrigation and flonase   Updated her daughter during examination   Chesley Mires, MD [place] Pager:  [phone] 01/11/2015, 12:24 PM

## 2015-01-11 NOTE — Patient Instructions (Signed)
Nasal irrigation (saline sinus rinse) daily for one week, then as needed Flonase two sprays each nostril daily for one week, then as needed Check oxygen level at home >> if staying above 90% off of oxygen, then you don't need to use oxygen during the day  Follow up in 2 months

## 2015-01-12 ENCOUNTER — Telehealth: Payer: Self-pay | Admitting: Pulmonary Disease

## 2015-01-12 ENCOUNTER — Telehealth: Payer: Self-pay | Admitting: *Deleted

## 2015-01-12 DIAGNOSIS — G4733 Obstructive sleep apnea (adult) (pediatric): Secondary | ICD-10-CM

## 2015-01-12 LAB — CULTURE, BLOOD (ROUTINE X 2)
Culture: NO GROWTH
Culture: NO GROWTH

## 2015-01-12 NOTE — Telephone Encounter (Signed)
lmtcb x1 needs to contact DME for this to see when she received it and they can check her eligibility

## 2015-01-12 NOTE — Telephone Encounter (Signed)
Transition Care Management Follow-up Telephone Call  How have you been since you were released from the hospital? fair   Do you understand why you were in the hospital? yes   Do you understand the discharge instrcutions? yes  Items Reviewed:  Medications reviewed: yes  Allergies reviewed: yes  Dietary changes reviewed: yes  Referrals reviewed: yes   Functional Questionnaire:   Activities of Daily Living (ADLs):   She states they are independent in the following: ambulation, bathing and hygiene, feeding, continence, grooming, toileting and dressing States they require assistance with the following: none - daughter is with patient   Any transportation issues/concerns?: no   Any patient concerns? Yes - will discuss with Dr Yong Channel   Confirmed importance and date/time of follow-up visits scheduled: yes   Confirmed with patient if condition begins to worsen call PCP or go to the ER.  Patient was given the Call-a-Nurse line [phone]: yes Patient was discharged 01/10/15 Patient was discharged to her home Patient has an appointment with Dr Yong Channel 01/13/15 at 11:30 am

## 2015-01-13 ENCOUNTER — Ambulatory Visit (INDEPENDENT_AMBULATORY_CARE_PROVIDER_SITE_OTHER): Payer: Medicare Other | Admitting: Family Medicine

## 2015-01-13 ENCOUNTER — Encounter: Payer: Self-pay | Admitting: Family Medicine

## 2015-01-13 VITALS — BP 138/96 | HR 99 | Temp 98.0°F | Wt 311.0 lb

## 2015-01-13 DIAGNOSIS — L304 Erythema intertrigo: Secondary | ICD-10-CM

## 2015-01-13 DIAGNOSIS — N390 Urinary tract infection, site not specified: Secondary | ICD-10-CM

## 2015-01-13 DIAGNOSIS — I10 Essential (primary) hypertension: Secondary | ICD-10-CM

## 2015-01-13 DIAGNOSIS — J9621 Acute and chronic respiratory failure with hypoxia: Secondary | ICD-10-CM | POA: Diagnosis not present

## 2015-01-13 DIAGNOSIS — A4151 Sepsis due to Escherichia coli [E. coli]: Secondary | ICD-10-CM | POA: Diagnosis not present

## 2015-01-13 DIAGNOSIS — E662 Morbid (severe) obesity with alveolar hypoventilation: Secondary | ICD-10-CM

## 2015-01-13 DIAGNOSIS — E038 Other specified hypothyroidism: Secondary | ICD-10-CM

## 2015-01-13 DIAGNOSIS — A419 Sepsis, unspecified organism: Secondary | ICD-10-CM

## 2015-01-13 NOTE — Telephone Encounter (Signed)
Pt returned call, please call back this afternoon  [phone]

## 2015-01-13 NOTE — Telephone Encounter (Signed)
lmtcb x2 for pt. 

## 2015-01-13 NOTE — Progress Notes (Signed)
Garret Reddish, MD  Subjective:  Tamara Morales is a 81 y.o. year old very pleasant female patient who presents for transitional care management. Transitional care Phone call was made 01/12/15.   Hospital follow up for sepsis due to E. Coli UTI as well as acute on chronic respiratory failure  Review of discharge summary shows the following: Patient washospitalized with sepsis due to E. Coli UTI from 7/21-7/24/16 (discharge date). Blood cultures were negative. She had acute on chronic respiratory failure requiring 4L o2 up from 2L at her baseline at nights. Plan for 2L o2 when sedentary and 3L upon exertion upon discharge. Patient also had candidal intertrigo treated with miconazole anti fungal powder. There is a mention of pulmonary hypertension but it is unclear how this was determined. This has not been mentioned by Dr. Halford Chessman in past and we will resolve this issue. Patient previously had been adjusted with her synthroid up to 200 mcg due to high TSH- it is not yet time to repeat this level. Patient was discharged home with Ceftin at home through 01/15/15 planned. She had been on vanc and sozyn for 1 day then ttransitioned to doxy then to ceftriaxone likely when cultures were available (sensitive E. Coli to cephalosporins). . Patient had originally presented with fever,chills after being treated for intertrigo with diflucan. She also complained of fatigue and malaise. At time of discharge patient remained fatigue and wanted to stay in hospital. She had hypokalemia which resolved during stay. Sent home with portable oxygen. CXR without acute process.   Patient also saw Dr. Halford Chessman on 01/11/15 day after discharge. Reviewed his note and dyspnea was thoguth due to recent bout with sepsis as well as atelecatsis from being in bed as ewll as her baseline obesity hypoventilation syndrome. Patient encouraged to keep oxygen >90%. Reiterated in his note no COPD.   Patient has a lot of questions today and she is very  concerned about returning to the hospital. Did have some nausea, weakness last night, unable to make good decisions, felt overwhelmed with any decisions. Took 2 tylenol and felt better. Was very active yesterday and did not wear oxygen. Went to sleep for 4 hours with cpap and still felt poorly. Denies fever/chills at that time. She wants aid in knowing when she should call for help or go to the hospital.  Patient has had intermittent elevations in blood pressure at home *unclear of accuracy of cuff but has continued home olmesartan-amlodipine-hctz. Today, BP mildly elevated DBP but has been controlled on prior visits.  BP Readings from Last 3 Encounters:  01/13/15 138/96  01/11/15 142/100  01/10/15 126/56   She is not having any urinary symptoms. Urgency with urination has resolved  Not ready to have TSH redrawn with recent increases and not at least 6 weeks out  Intertrigo resolved with miconazole.   Urine culture [facility]  ROS- no fever, chills, vomiting. No CVA tenderness or suprapubic tenderness. No chest pain. Just feels more fatigued than usual.   Past Medical History- morbid obesity, obesity hypoventilation syndrome, deperssion, HTN, hyperglycemia, HLD, hypothyroidism, OSA, panic attacks  Medications- reviewed and updated Current Outpatient Prescriptions  Medication Sig Dispense Refill  . aspirin 81 MG tablet Take 81 mg by mouth every morning.     . cetirizine (ZYRTEC) 10 MG tablet Take 10 mg by mouth daily.    [name] FIBER PO Take 2 tablets by mouth every morning.     . fluticasone (FLONASE) 50 MCG/ACT nasal  spray Place 2 sprays into both nostrils daily. 16 g 2  . levothyroxine (SYNTHROID, LEVOTHROID) 200 MCG tablet TAKE 1 TABLET BY MOUTH ONCE DAILY BEFORE BREAKFAST 90 tablet 3  . loratadine (CLARITIN) 10 MG tablet Take 10 mg by mouth daily as needed for allergies.     . Omega-3 Fatty Acids (FISH OIL) 1200 MG CAPS Take 2,400 mg by mouth daily.      [name] omeprazole (PRILOSEC) 20 MG capsule TAKE 1 CAPSULE (20 MG TOTAL) BY MOUTH 2 (TWO) TIMES DAILY. 180 capsule 3  . phenazopyridine (AZO-TABS) 95 MG tablet Take 95 mg by mouth daily.     . potassium chloride (MICRO-K) 10 MEQ CR capsule TAKE 2 CAPSULE BY MOUTH ONCE DAILY (Patient taking differently: Take 20 mEq by mouth daily. ) 90 capsule 3  . ALPRAZolam (XANAX) 0.25 MG tablet TAKE 1/2 to 1 TABLET BY MOUTH TWICE A DAY AS NEEDED FOR ANXIETY OR SLEEP (Patient not taking: Reported on 01/13/2015) 40 tablet 2  . cefUROXime (CEFTIN) 500 MG tablet Take 1 tablet (500 mg total) by mouth 2 (two) times daily with a meal. 6 tablet 0  . nystatin (MYCOSTATIN/NYSTOP) 100000 UNIT/GM POWD APPLY TO AFFECTED AREA TWICE A DAY (Patient not taking: Reported on 01/13/2015) 120 g 2   Objective: BP 138/96 mmHg  Pulse 99  Temp(Src) 98 F (36.7 C)  Wt 311 lb (141.069 kg)  SpO2 92% at rest Gen: NAD, resting comfortably in chair, breathing appears labored with effort such as walking down hall CV: RRR no murmurs rubs or gallops Lungs: CTAB no crackles, wheeze, rhonchi, distant Abdomen: soft/nontender specifically no suprapubic or CVA tenderness/nondistended/normal bowel sounds. No rebound or guarding. Morbidly obese.  Ext: 2+ edema, chronic venous stasis changes Skin: warm, dry Neuro: grossly normal, moves all extremities, walks with support of 2 canes   Assessment/Plan:  Transitional care management- high complexity medical decision making required.   Sepsis due to E. Coli UTI- resolved with ceftin but has 2 days remaining. We will plan on repeat urine culture after finishing antibiotics and written order given [facility] to [facility] [place].   Acute on chronic respiratory failure- baseline oxygen requirement due to obesity hypoventilation. Has had extensive workup in past for other causes of dyspnea. Has declined cardiac workup and continues to. Sats in office at rest are 92%, with walking in hosptial were 86%. Suspect  this is contributing to her fatigue and feeling run down after activity and advised oxygen with activity. She can remain off oxygen at rest except when she is sleeping. She sees pulm in 2 months and may be walked at that time or when sees [place] back to see if still requiring excess o2. No PNA on CXR. LIkely atelectasis -work on deep breathing. We made a plan to [facility] if she has concern for assistance in taking next steps- calling on call doc, waiting for visit, going to ED. Advised low threshold for return office visit. Encouraged need for exercise  Hypertension- mild elevation today. Continue prior BP meds and recheck next visit, titrate up if persistently high DBP  Hypothyroidism- still needs repeat TSH in about 6 weeks, recent adjustment needs time to equilibrate  Intertrigo- resolved with miconazole powder.   Return precautions advised. i am out of office next week, but can see one of my colleagues as needed but think patient will do with graduated increase in activity as well as wearing oxygen

## 2015-01-13 NOTE — Telephone Encounter (Signed)
Spoke with pt. States that she can't contact her DME company because they are no longer in business. She believes that she received the other CPAP in 2010/2011. If this is the case, she would elibigble for a new CPAP. Pt currently has a loaner CPAP from [facility]. She is going to contact them and ask them if they will accept her sleep study from 2012 so that she can have a new CPAP. Pt will call us back.

## 2015-01-13 NOTE — Patient Instructions (Addendum)
Finish antibiotic course. Urine culture after you finish  Wear oxygen with any activity even around the house  Try to slowly build back to normal (perhaps do 1/4 of what you would normally do for a aweek then 1/2 of what you would do the next week then 3/4 the next then full the next)  Follow up if any urgency with urination, pain with urination, more frequent urination issues with [place] in office. Also if you had fevers, chills, worsening shortness of breath  [facility] but can also call our on call doctor if needed  I can see you week after next if needed for a recheck- front desk may use same day appointment

## 2015-01-14 ENCOUNTER — Telehealth: Payer: Self-pay | Admitting: Family Medicine

## 2015-01-14 NOTE — Telephone Encounter (Signed)
Pt call to say that the following med is not showing on her med list Jabier Gauss and request  that it be added

## 2015-01-14 NOTE — Telephone Encounter (Signed)
tribenzor has been added

## 2015-01-15 NOTE — Telephone Encounter (Signed)
lmtcb for pt.  

## 2015-01-18 DIAGNOSIS — I5033 Acute on chronic diastolic (congestive) heart failure: Secondary | ICD-10-CM | POA: Diagnosis not present

## 2015-01-18 DIAGNOSIS — N390 Urinary tract infection, site not specified: Secondary | ICD-10-CM | POA: Diagnosis not present

## 2015-01-18 DIAGNOSIS — R5383 Other fatigue: Secondary | ICD-10-CM | POA: Diagnosis not present

## 2015-01-18 NOTE — Telephone Encounter (Signed)
Spoke with the pt  She states that [facility] is needing further info from [place] ? When was PSG done? I have called Melissa at [facility] to find out what exactly is needing and had to [facility]

## 2015-01-19 NOTE — Telephone Encounter (Signed)
LMTCB x 1 for [facility]

## 2015-01-19 NOTE — Telephone Encounter (Signed)
Spoke with [facility] States that they need an order placed for CPAP repair and CPAP supplies.  Pt machine is broken and she is renting from them at this time until hers is repaired.  Orders placed as urgent.  Nothing further needed.

## 2015-01-20 ENCOUNTER — Telehealth: Payer: Self-pay | Admitting: Family Medicine

## 2015-01-20 NOTE — Telephone Encounter (Signed)
See below

## 2015-01-20 NOTE — Telephone Encounter (Addendum)
Tiffany is calling to report UA is negative. Culture is greater than one hundred thousands multiple bacteria is  present suggest recollection is indicated

## 2015-01-20 NOTE — Telephone Encounter (Signed)
This does not correspond to a true UTI. Let's have patient monitor for any symptoms and alert [place] immediately if any develop for UTI- burning, frequency, urgency, fevers, chills etc

## 2015-01-21 DIAGNOSIS — M79651 Pain in right thigh: Secondary | ICD-10-CM | POA: Diagnosis not present

## 2015-01-21 DIAGNOSIS — R601 Generalized edema: Secondary | ICD-10-CM | POA: Diagnosis not present

## 2015-01-21 DIAGNOSIS — M6281 Muscle weakness (generalized): Secondary | ICD-10-CM | POA: Diagnosis not present

## 2015-01-21 DIAGNOSIS — A4189 Other specified sepsis: Secondary | ICD-10-CM | POA: Diagnosis not present

## 2015-01-21 NOTE — Telephone Encounter (Signed)
I called the Tamara Morales the nurse and pt and informed her of the message below and the pt stated she is having minor urgency but will call back if she needs to see someone as I reminded her Dr Yong Channel is not in the office but we could schedule her with another provider.

## 2015-01-25 ENCOUNTER — Telehealth: Payer: Self-pay | Admitting: Pulmonary Disease

## 2015-01-25 DIAGNOSIS — G4733 Obstructive sleep apnea (adult) (pediatric): Secondary | ICD-10-CM

## 2015-01-25 NOTE — Telephone Encounter (Signed)
Per 01/19/15 OV: Note    Patient needs CPAP machine repaired.    Please also renew CPAP supplies - mask, hosing, tubing and headgear. Thanks.    DME [facility]    ---  Pt calling stating she has not heard from [facility]. Pt received her current machine in 2012 from a company that has gone out of business. I have sent a staff message to [facility] checking on this.

## 2015-01-26 NOTE — Telephone Encounter (Signed)
Per message from [place]: Found out that this pt is eligible for a new CPAP.   Here are our notes from 7/26:   Patients daughter brought in her cpap machine to be checked. Button broke on patient's cpap machine. Was received from a different company that went out of business. Per Jearld Fenton, patient needs to get rx for new cpap machine. Patient is renting a cpap from [place] in the meantime. S10 [employer]. 5-15cm auto   Can you please put in an order for a replacement CPAP with pressure settings?  We will give her a call to schedule set up. ---  Please advise Dr. Halford Chessman on order for new CPAP? thanks

## 2015-01-27 ENCOUNTER — Telehealth: Payer: Self-pay | Admitting: Pulmonary Disease

## 2015-01-27 NOTE — Telephone Encounter (Signed)
VS patient calling with c/o increased hoarseness, cough and sore throat x 1 day.  Pt reports this is worsening as the day goes on. Denies fever.  Pt has not tried any OTC meds for symptoms.  Using Flonase and Saline rinses as directed.  Pt also reports that she has been having to use her O2 today because her O2 levels were dropping below 88% - pt reports low O2 of 84%.  Please advise Dr Melvyn Novas as Dr Halford Chessman is unavailable. Thanks.  Allergies  Allergen Reactions  . Azithromycin Itching  . Beta Adrenergic Blockers     Depression   . Ciprofloxacin Other (See Comments)    REACTION: edgy and very jumpy   . Codeine Nausea And Vomiting  . Epinephrine Other (See Comments)    REACTION: rapid pulse, sweats  . Klonopin [Clonazepam] Other (See Comments)    Makes her feel very suicidal   . Oxycodone Other (See Comments)    Patient felt like it altered her mental status "Crazy" . Hallucinations later as well.  [name] Paxil [Paroxetine Hydrochloride] Other (See Comments)    Sever depression   . Prednisone     "makes me feel really bad"  . Propoxyphene Hcl Nausea And Vomiting  . Meloxicam Diarrhea  . Vancomycin Rash    Localized rash related to infusion rate.

## 2015-01-27 NOTE — Telephone Encounter (Signed)
Okay to send order for auto CPAP range 5 to 15 cm H2O with heated humidity.

## 2015-01-27 NOTE — Telephone Encounter (Signed)
Can't treat this over the phone and if sats aren't staying up at rest should go to the ER now

## 2015-01-27 NOTE — Telephone Encounter (Signed)
Pt returned call  [phone]

## 2015-01-27 NOTE — Telephone Encounter (Signed)
Pt aware order was placed for CPAP  Nothing further needed

## 2015-01-27 NOTE — Telephone Encounter (Signed)
lmomtcb x1 for pt Order placed

## 2015-01-27 NOTE — Telephone Encounter (Signed)
Patient notified of Dr. Gustavus Bryant recommendations.  Patient says that her Oxygen level is at 98% right now on 2L She said she will monitor her oxygen level and if it drops again she will go to ER Advised patient to call us if needed

## 2015-01-28 ENCOUNTER — Ambulatory Visit (INDEPENDENT_AMBULATORY_CARE_PROVIDER_SITE_OTHER): Payer: Medicare Other | Admitting: Family Medicine

## 2015-01-28 ENCOUNTER — Encounter: Payer: Self-pay | Admitting: Family Medicine

## 2015-01-28 VITALS — BP 130/74 | HR 96 | Temp 98.4°F | Wt 305.0 lb

## 2015-01-28 DIAGNOSIS — R0989 Other specified symptoms and signs involving the circulatory and respiratory systems: Secondary | ICD-10-CM | POA: Diagnosis not present

## 2015-01-28 DIAGNOSIS — R05 Cough: Secondary | ICD-10-CM | POA: Diagnosis not present

## 2015-01-28 DIAGNOSIS — R059 Cough, unspecified: Secondary | ICD-10-CM

## 2015-01-28 MED ORDER — DOXYCYCLINE HYCLATE 100 MG PO TABS: 100.0000 mg | ORAL_TABLET | Freq: Two times a day (BID) | ORAL | Status: DC

## 2015-01-28 NOTE — Progress Notes (Signed)
Garret Reddish, MD  Subjective:  Tamara Morales is a 81 y.o. year old very pleasant female patient who presents with:  Congestion (reports nasal and chest), cough -Started about a week ago after seeing Dr. Halford Chessman. Had some nasal congestion at that time. Was using saline rinse. Hoarse, sore throat, increased cough, a lot of drainage, no fever. Sleeping less. Dry cough. No sinus pressure. Mucus is watery.   Patient with baseline chronic respiratory failure due to obesity hypoventilation requiring 2L o2 with activity and at night. Shortness of breath no worse.   ROS-Urinary Urgency has resolved   Past Medical History- mrobidy obesity, history atypical chest pain, recent history of sepsis due to UTi requiring hospitalization, panic attacks, OSA, hypothyroid, hyperlipid, hyperglycemia, HTN, depression  Medications- reviewed and updated Current Outpatient Prescriptions  Medication Sig Dispense Refill  . aspirin 81 MG tablet Take 81 mg by mouth every morning.     . cetirizine (ZYRTEC) 10 MG tablet Take 10 mg by mouth daily.    [name] FIBER PO Take 2 tablets by mouth every morning.     . fluticasone (FLONASE) 50 MCG/ACT nasal spray Place 2 sprays into both nostrils daily. 16 g 2  . levothyroxine (SYNTHROID, LEVOTHROID) 200 MCG tablet TAKE 1 TABLET BY MOUTH ONCE DAILY BEFORE BREAKFAST 90 tablet 3  . loratadine (CLARITIN) 10 MG tablet Take 10 mg by mouth daily as needed for allergies.     [name] nystatin (MYCOSTATIN/NYSTOP) 100000 UNIT/GM POWD APPLY TO AFFECTED AREA TWICE A DAY 120 g 2  . Olmesartan-Amlodipine-HCTZ (TRIBENZOR) 20-5-12.5 MG TABS Take by mouth.    . Omega-3 Fatty Acids (FISH OIL) 1200 MG CAPS Take 2,400 mg by mouth daily.     [name] omeprazole (PRILOSEC) 20 MG capsule TAKE 1 CAPSULE (20 MG TOTAL) BY MOUTH 2 (TWO) TIMES DAILY. 180 capsule 3  . phenazopyridine (AZO-TABS) 95 MG tablet Take 95 mg by mouth daily.     . potassium chloride (MICRO-K) 10 MEQ CR capsule TAKE 2 CAPSULE BY MOUTH ONCE DAILY  (Patient taking differently: Take 20 mEq by mouth daily. ) 90 capsule 3  . ALPRAZolam (XANAX) 0.25 MG tablet TAKE 1/2 to 1 TABLET BY MOUTH TWICE A DAY AS NEEDED FOR ANXIETY OR SLEEP (Patient not taking: Reported on 01/13/2015) 40 tablet 2   Objective: BP 130/74 mmHg  Pulse 96  Temp(Src) 98.4 F (36.9 C)  Wt 305 lb (138.347 kg)  SpO2 96% Gen: NAD, resting comfortably Pharynx mildly erythematous, nares mildly red with some clear drainage CV: RRR no murmurs rubs or gallops Lungs: CTAB no crackles, wheeze, rhonchi, rate around 16, does not appear more labored than baseline Abdomen: soft/nontender/nondistended/normal bowel sounds. No rebound or guarding.  Ext: chronic 2+ pitting edema Skin: warm, dry, no rash Neuro: grossly normal, moves all extremities, walks with 2 canes   Assessment/Plan:  Congestion (reports nasal and chest), cough No obvious sinusitis with no sinus pain. Lungs clear on exam. Baseline chronic respiratory failure on oxygen. Sats have been slightly lower than baseline and she is using oxygen in clinic today though. High risk patient due to comorbidities and high risk with recent hospitalization. Doubt pneumonia and overall think low likelihood bacterial infection. Most likely this is a URI but Due to high risk of patient and respiratory issues and a week of symptoms that are stable to worsening, we opted to treat with 1 week of doxycycline. F/u with me or Dr. Halford Chessman if not improving (doxy would cover some sinusitis as well as PNA  and has allergies to azithro and quinolones so options limited).   Meds ordered this encounter  Medications  . doxycycline (VIBRA-TABS) 100 MG tablet    Sig: Take 1 tablet (100 mg total) by mouth 2 (two) times daily.    Dispense:  14 tablet    Refill:  0

## 2015-01-28 NOTE — Patient Instructions (Signed)
Medication Instructions:  Added tribenzor back Trake doxycline for 1 week twice a day  Other Instructions:  If symptoms are not improving by day 3-4 of antibiotics or if symptoms persist after you finish or if you have new or worsening symptoms return to see [place] or Dr. Halford Chessman. We would likely do an x-ray at that point (your lungs were clear though)  I think this is a viral upper respiratory infection but with your baseline breathing issues and recent hospitalization, I am being aggressive to cover you for things like sinus infection, bacterial upper respiratory infection, some coverage for pneumonia

## 2015-02-08 ENCOUNTER — Other Ambulatory Visit (INDEPENDENT_AMBULATORY_CARE_PROVIDER_SITE_OTHER): Payer: Medicare Other

## 2015-02-08 DIAGNOSIS — E890 Postprocedural hypothyroidism: Secondary | ICD-10-CM

## 2015-02-08 LAB — TSH: TSH: 0.67 u[IU]/mL (ref 0.35–4.50)

## 2015-02-12 ENCOUNTER — Telehealth: Payer: Self-pay | Admitting: Pulmonary Disease

## 2015-02-12 NOTE — Telephone Encounter (Signed)
Opened in error

## 2015-02-16 DIAGNOSIS — I890 Lymphedema, not elsewhere classified: Secondary | ICD-10-CM | POA: Diagnosis not present

## 2015-02-24 ENCOUNTER — Other Ambulatory Visit: Payer: Self-pay | Admitting: Family Medicine

## 2015-02-24 NOTE — Telephone Encounter (Signed)
Refill ok? 

## 2015-02-24 NOTE — Telephone Encounter (Signed)
Yes thanks 

## 2015-03-08 IMAGING — CR DG CHEST 2V
2 series · 2 of 2 positions shown · non-contrast
Comparison: 07/30/2011

CLINICAL DATA: Chest pain

CHEST - 2 VIEW

[w chest pa]
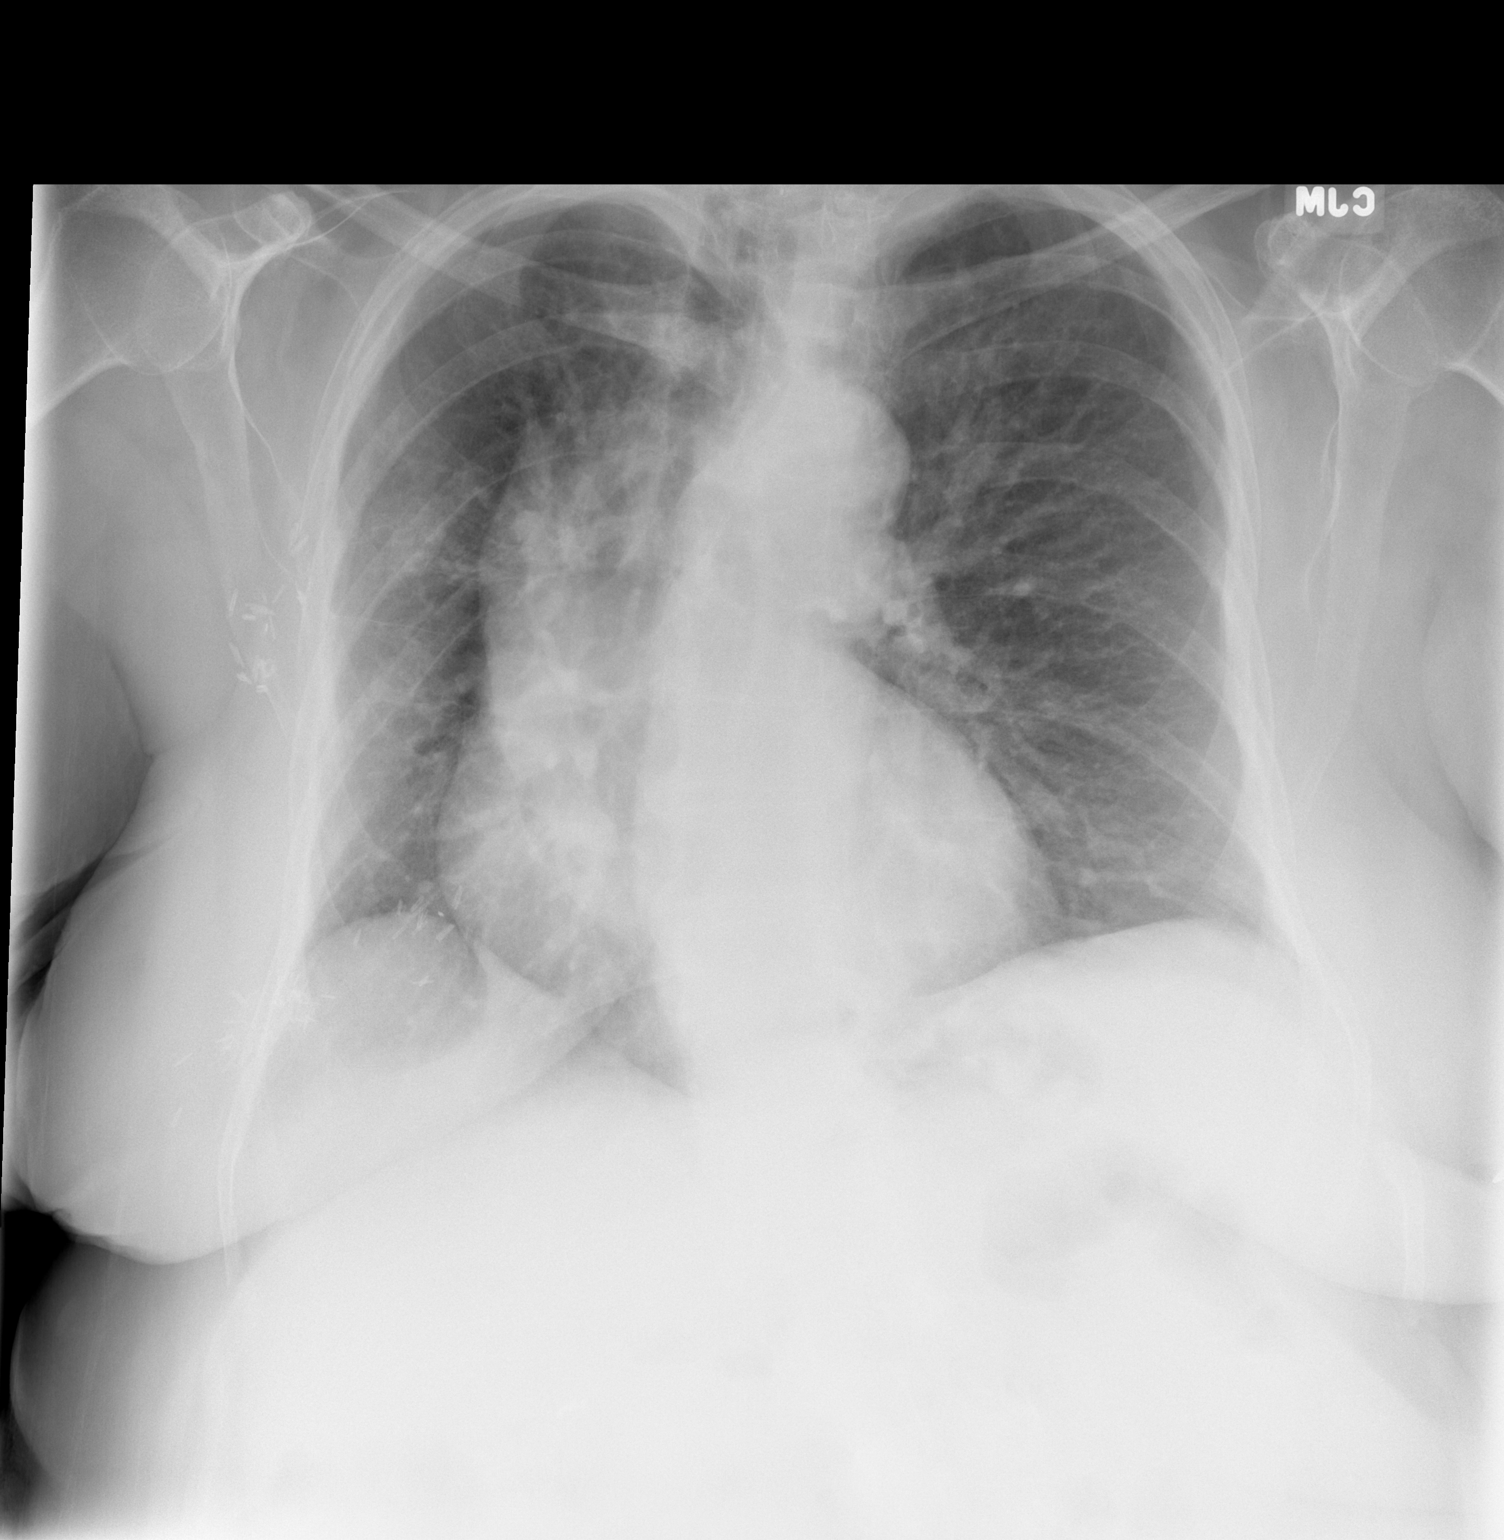

[w chest lat]
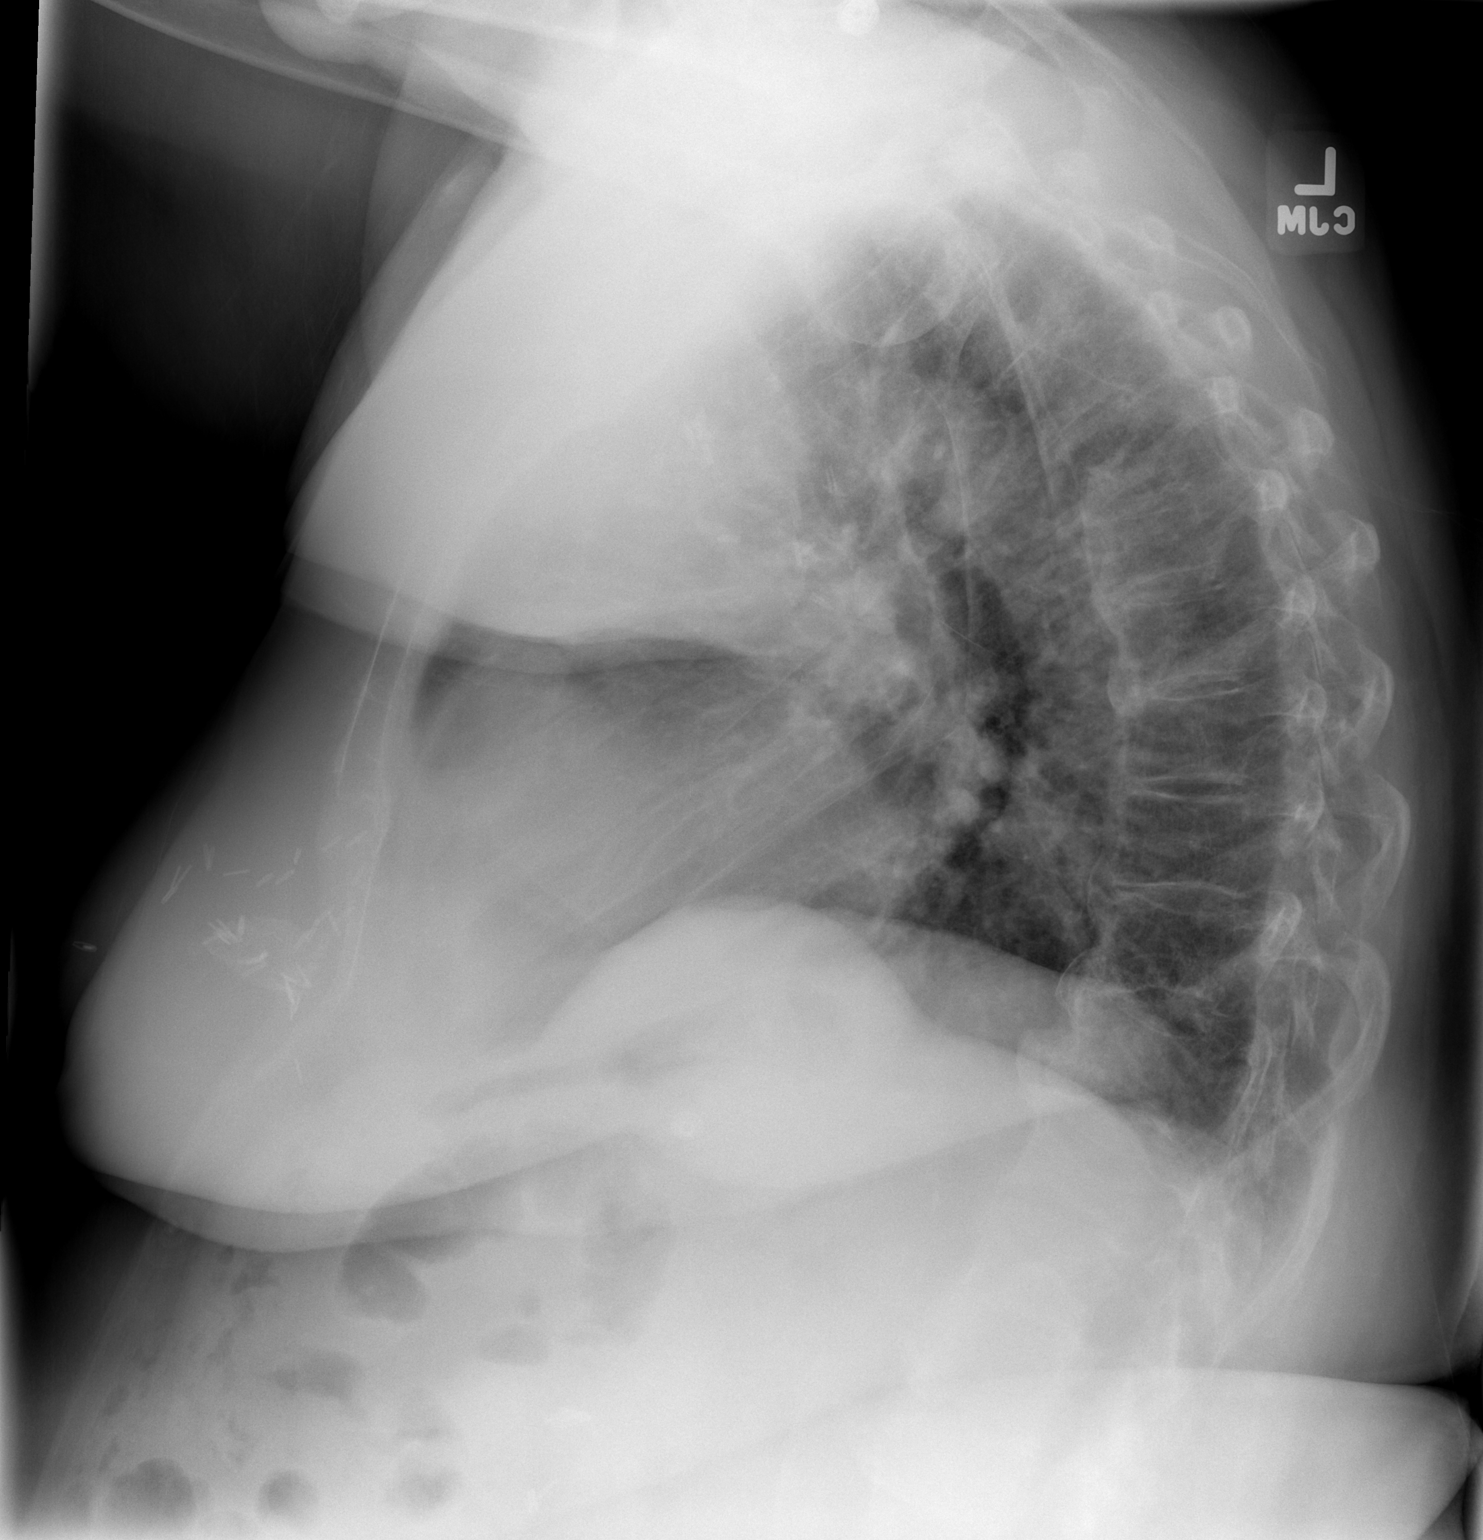

[2 of 2 positions shown; findings below may reference images not displayed]

FINDINGS: Cardiomegaly again noted.  Stable postsurgical changes
right axilla and right breast.  No pulmonary edema.  Chronic mild
interstitial prominence without convincing pulmonary edema.  No
segmental infiltrate.
IMPRESSION: Stable postsurgical changes right axilla and right breast.  No
pulmonary edema.  Chronic mild interstitial prominence without
convincing pulmonary edema.  No segmental infiltrate.]

## 2015-03-11 DIAGNOSIS — H2640 Unspecified secondary cataract: Secondary | ICD-10-CM | POA: Diagnosis not present

## 2015-03-11 DIAGNOSIS — H26491 Other secondary cataract, right eye: Secondary | ICD-10-CM | POA: Diagnosis not present

## 2015-03-15 ENCOUNTER — Ambulatory Visit (INDEPENDENT_AMBULATORY_CARE_PROVIDER_SITE_OTHER): Payer: Medicare Other | Admitting: Pulmonary Disease

## 2015-03-15 ENCOUNTER — Encounter: Payer: Self-pay | Admitting: Pulmonary Disease

## 2015-03-15 VITALS — BP 124/80 | HR 88 | Temp 97.5°F | Ht 65.0 in | Wt 307.0 lb

## 2015-03-15 DIAGNOSIS — Z6841 Body Mass Index (BMI) 40.0 and over, adult: Secondary | ICD-10-CM | POA: Diagnosis not present

## 2015-03-15 DIAGNOSIS — Z23 Encounter for immunization: Secondary | ICD-10-CM | POA: Diagnosis not present

## 2015-03-15 DIAGNOSIS — G4733 Obstructive sleep apnea (adult) (pediatric): Secondary | ICD-10-CM

## 2015-03-15 DIAGNOSIS — E662 Morbid (severe) obesity with alveolar hypoventilation: Secondary | ICD-10-CM

## 2015-03-15 NOTE — Progress Notes (Signed)
Chief Complaint  Patient presents with  . Follow-up    pt states she is doing pretty good. pt states she is not using CPAP because it broke, she took it back to [facility] . pt states the machine they lent her isnt really good so she returned it to them. pt currently waiting on replacement device.  pt states she is using concentrator at night  set at 2LPM and sleeping in recliner so she is doing ok. DME: [facility]    History of Present Illness: Tamara Morales is a 81 y.o. female with severe OSA using CPAP and 2 liters oxygen.  Her CPAP machine broke.  She was told by [facility] that she needed to get this fixed or she could try working with a different DME.  Her machine was sent for repairs.  She was given a loaner CPAP, but this was a disaster.  As a result she can't use CPAP.  She is sleeping in a recliner with oxygen > this seems okay, but not as good as with CPAP.  She is not having trouble with her sinuses anymore.  She was seen by a vein specialist for lymphedema.  She was advised to have leg massage by PT, but insurance won't pay for this.  TESTS: PSG 01/22/11 >> AHI 74.3, SpO2 low 76%. ONO with RA and CPAP 04/21/11 >> Test time 5 hr 12 min. Basal SpO2 87%, low SpO2 73%. Spent 161 min with SpO2 < 88%. Echo 04/01/13 >> EF 55 to 60%, PAS 43 mmHg CPAP 05/11/12 to 08/08/12 >> used on 89 of 90 nights with average 4 hrs 38 min.  Average AHI 6.7 with CPAP 10 cm H2O. CPAP 10/11/12 to 01/08/13 >> Used on 88 of 90 nights with average 5 hrs 15 min. Average AHI 5.3 with CPAP 11 cm H2O. Auto CPAP 09/13/14 to 09/28/14 >> used on 13 of 16 nights with average 3 hrs and 24 min. Average AHI is 2.1 with median CPAP 7 cm H2O and 95 th percentile CPAP 11 cm H20. CT chest 11/03/14 >> atherosclerosis, patchy atelectasis, scoliosis, old Rt fib fx  PMHx >> HA, OA, Depression GERD, HLD, HTN, Breast cancer in 2000, Hypothyroidism  PSHx, Medications, Allergies, Fhx, Shx reviewed.   Physical Exam: Pulse 88  Temp(Src) 97.5 F  (36.4 C) (Oral)  Ht $R'5\' 5"'aL$  (1.651 m)  Wt 307 lb (139.254 kg)  BMI 51.09 kg/m2  SpO2 94%  General - No distress ENT - No sinus tenderness, no oral exudate, no LAN Cardiac - s1s2 regular, no murmur Chest - No wheeze/rales/dullness Back - No focal tenderness Abd - Soft, non-tender Ext - 2+ edema Neuro - Normal strength Skin - No rashes Psych - normal mood, and behavior    Assessment/Plan:  Obstructive sleep apnea. Plan: - she will call if she needs help getting new CPAP machine set up >> will then send order for specific machine she should get  Obesity hypoventilation syndrome. Plan: - continue 2 liters oxygen at night with CPAP  Morbid obesity. Plan: - discussed options for assisting with weight loss  Upper airway cough syndrome. Improved. Plan: - nasal irrigation and flonase prn   Chesley Mires, MD [place] [facility] Pager:  [phone] 03/15/2015, 11:33 AM

## 2015-03-15 NOTE — Patient Instructions (Signed)
Call if you need help getting new CPAP machine  Follow up in 6 months

## 2015-03-16 ENCOUNTER — Encounter: Payer: Self-pay | Admitting: Family Medicine

## 2015-03-16 NOTE — Telephone Encounter (Signed)
Bevelyn Ngo- I have placed this on your desk to fax.   I suspect the chills she got was her body adjusting to vaccination. I highly doubt this is diabetes. We just checked a1c 7 months ago- she is at risk but doubt diabetes.

## 2015-03-18 DIAGNOSIS — D529 Folate deficiency anemia, unspecified: Secondary | ICD-10-CM | POA: Diagnosis not present

## 2015-03-18 DIAGNOSIS — R739 Hyperglycemia, unspecified: Secondary | ICD-10-CM | POA: Diagnosis not present

## 2015-03-18 LAB — HEMOGLOBIN A1C: HEMOGLOBIN A1C: 6.2 % — AB (ref 4.0–6.0)

## 2015-03-19 ENCOUNTER — Encounter: Payer: Self-pay | Admitting: Family Medicine

## 2015-03-22 ENCOUNTER — Encounter: Payer: Self-pay | Admitting: Family Medicine

## 2015-03-29 ENCOUNTER — Encounter: Payer: Self-pay | Admitting: Pulmonary Disease

## 2015-03-29 DIAGNOSIS — G4733 Obstructive sleep apnea (adult) (pediatric): Secondary | ICD-10-CM

## 2015-03-29 NOTE — Telephone Encounter (Signed)
Order has been placed. Pt sent an email response

## 2015-03-29 NOTE — Telephone Encounter (Signed)
Will have my nurse place order for [employer] heated air tubing with her DME.

## 2015-03-29 NOTE — Telephone Encounter (Signed)
Per pt email: The estimate to repair my original Cpap was $1,300. I elected to 'borrow' one that is not being used by the owner with the understanding that when/if she ever wanted it back I would get the pressure numbers changed back to her original numbers. I have used it for 3 nights now and am happy to have it back. Feeling less tired and my blood oxygen level is regularly above 90.   I do need you to order a [employer]Line heated air tubing for me. [facility] does not stock this tubing so it will require some insistence as I have been unable to accomplish this.   I appreciate the help of you and your staff and particularly your patience with me.      ---  Please advise Dr. Halford Chessman thanks

## 2015-03-31 DIAGNOSIS — M6281 Muscle weakness (generalized): Secondary | ICD-10-CM | POA: Diagnosis not present

## 2015-03-31 DIAGNOSIS — R601 Generalized edema: Secondary | ICD-10-CM | POA: Diagnosis not present

## 2015-03-31 DIAGNOSIS — I890 Lymphedema, not elsewhere classified: Secondary | ICD-10-CM | POA: Diagnosis not present

## 2015-04-02 ENCOUNTER — Encounter: Payer: Self-pay | Admitting: Family Medicine

## 2015-04-02 ENCOUNTER — Ambulatory Visit (INDEPENDENT_AMBULATORY_CARE_PROVIDER_SITE_OTHER): Payer: Medicare Other | Admitting: Family Medicine

## 2015-04-02 VITALS — BP 116/68 | HR 75 | Temp 97.9°F | Ht 65.0 in | Wt 310.0 lb

## 2015-04-02 DIAGNOSIS — G43809 Other migraine, not intractable, without status migrainosus: Secondary | ICD-10-CM

## 2015-04-02 DIAGNOSIS — R600 Localized edema: Secondary | ICD-10-CM | POA: Diagnosis not present

## 2015-04-02 DIAGNOSIS — I10 Essential (primary) hypertension: Secondary | ICD-10-CM | POA: Diagnosis not present

## 2015-04-02 DIAGNOSIS — E890 Postprocedural hypothyroidism: Secondary | ICD-10-CM | POA: Diagnosis not present

## 2015-04-02 DIAGNOSIS — B372 Candidiasis of skin and nail: Secondary | ICD-10-CM

## 2015-04-02 MED ORDER — CLOTRIMAZOLE 1 % EX CREA
1.0000 | TOPICAL_CREAM | Freq: Two times a day (BID) | CUTANEOUS | Status: DC
Start: 2015-04-02 — End: 2016-12-22

## 2015-04-02 MED ORDER — SYNTHROID 200 MCG PO TABS: 200.0000 ug | ORAL_TABLET | Freq: Every day | ORAL | Status: DC

## 2015-04-02 MED ORDER — FLUCONAZOLE 150 MG PO TABS: 150.0000 mg | ORAL_TABLET | Freq: Once | ORAL | Status: DC

## 2015-04-02 MED ORDER — ISOMETHEPTENE-DICHLORAL-APAP 65-100-325 MG PO CAPS: 1.0000 | ORAL_CAPSULE | Freq: Every day | ORAL | Status: DC | PRN

## 2015-04-02 MED ORDER — TRIBENZOR 20-5-12.5 MG PO TABS: 1.0000 | ORAL_TABLET | Freq: Every day | ORAL | Status: DC

## 2015-04-02 NOTE — Assessment & Plan Note (Signed)
S: controlled on synthroid 271mcg- name brand. Patient recently picked up rx which was not name brand Lab Results  Component Value Date   TSH 0.67 02/08/2015  A/P: filled as synthroid as DAW. Patient to complete current rx then change back to synthroid

## 2015-04-02 NOTE — Assessment & Plan Note (Signed)
S: recurrent issue. Patient would like to have aids available at home for self managemet A/P: already uses powders. Given fungal cream as next step. Fluconazole as last step

## 2015-04-02 NOTE — Patient Instructions (Addendum)
Trial immodium for diarrhea if occurs. We can try the lomotil if this doesn't help but most diarrhea just needs to run its course  Changed to synthroid and tribenzor  For yeast infections in skin-  Use powder first- fungal cream second can use fluconazole but if does not resolve need to be seen as last resort  3 months or as needed

## 2015-04-02 NOTE — Progress Notes (Signed)
Garret Reddish, MD  Subjective:  Tamara Morales is a 81 y.o. year old very pleasant female patient who presents for/with See problem oriented charting ROS- known dyspnea on exertion due to obesity hypoventilation, known edema. No chest pain. No headache or blurry vision  Past Medical History-  Patient Active Problem List   Diagnosis Date Noted  . Atypical chest pain 12/28/2014    Priority: High  . Obesity hypoventilation syndrome ([place]) 11/27/2014    Priority: High  . Morbid obesity ([place]) 10/04/2009    Priority: High  . Sepsis secondary to UTI [facility])     Priority: Medium  . Hyperglycemia 07/29/2014    Priority: Medium  . Edema 05/29/2014    Priority: Medium  . History of breast cancer 05/29/2014    Priority: Medium  . DOE (dyspnea on exertion) 12/05/2013    Priority: Medium  . Sleep-related hypoventilation 05/27/2013    Priority: Medium  . Panic attacks 03/19/2013    Priority: Medium  . OSA (obstructive sleep apnea) 01/17/2011    Priority: Medium  . Depression 10/04/2009    Priority: Medium  . Hypothyroidism 08/05/2009    Priority: Medium  . Hyperlipidemia 08/05/2009    Priority: Medium  . Essential hypertension 08/05/2009    Priority: Medium  . Allergic rhinitis 05/29/2014    Priority: Low  . Migraine 10/04/2009    Priority: Low  . Osteoarthritis 10/04/2009    Priority: Low  . GERD 08/05/2009    Priority: Low  . Candidal intertrigo 04/02/2015  . Acute on chronic respiratory failure with hypoxia (HCC)     Medications- reviewed and updated Current Outpatient Prescriptions  Medication Sig Dispense Refill  . ALPRAZolam (XANAX) 0.25 MG tablet TAKE 1/2 TO 1 TABLET BY MOUTH TWICE A DAY AS NEEDED FOR ANXIETY OR SLEEP 40 tablet 4  . aspirin 81 MG tablet Take 81 mg by mouth every morning.     . cetirizine (ZYRTEC) 10 MG tablet Take 10 mg by mouth as needed.     . clotrimazole (LOTRIMIN) 1 % cream Apply 1 application topically 2 (two) times daily. As needed for fungal  skin infections 113 g 1  . FIBER PO Take 2 tablets by mouth every morning.     . fluconazole (DIFLUCAN) 150 MG tablet Take 1 tablet (150 mg total) by mouth once. Take again 3 days later 2 tablet 0  . fluticasone (FLONASE) 50 MCG/ACT nasal spray Place 2 sprays into both nostrils daily. 16 g 2  . isometheptene-acetaminophen-dichloralphenazone (MIDRIN) 65-100-325 MG capsule Take 1 capsule by mouth daily as needed for migraine. 30 capsule 0  . loratadine (CLARITIN) 10 MG tablet Take 10 mg by mouth daily as needed for allergies.     [name] nystatin (MYCOSTATIN/NYSTOP) 100000 UNIT/GM POWD APPLY TO AFFECTED AREA TWICE A DAY 120 g 2  . Omega-3 Fatty Acids (FISH OIL) 1200 MG CAPS Take 2,400 mg by mouth daily.     [name] omeprazole (PRILOSEC) 20 MG capsule TAKE 1 CAPSULE (20 MG TOTAL) BY MOUTH 2 (TWO) TIMES DAILY. 180 capsule 3  . potassium chloride (MICRO-K) 10 MEQ CR capsule TAKE 2 CAPSULE BY MOUTH ONCE DAILY (Patient taking differently: Take 20 mEq by mouth daily. ) 90 capsule 3  . SYNTHROID 200 MCG tablet Take 1 tablet (200 mcg total) by mouth daily before breakfast. 90 tablet 3  . TRIBENZOR 20-5-12.5 MG TABS Take 1 tablet by mouth daily. Name brand 90 tablet 3   No current facility-administered medications for this visit.  Objective: BP 116/68 mmHg  Pulse 75  Temp(Src) 97.9 F (36.6 C) (Oral)  Ht $R'5\' 5"'BR$  (1.651 m)  Wt 310 lb (140.615 kg)  BMI 51.59 kg/m2  SpO2 98% Gen: NAD, resting comfortably CV: RRR no murmurs rubs or gallops Lungs: CTAB no crackles, wheeze, rhonchi Abdomen: soft/nontender/nondistended/normal bowel sounds. No rebound or guarding. Morbidly obese Ext: profound edema Skin: warm, dry, venous stasis changes Neuro: grossly normal, moves all extremities  Assessment/Plan:  Essential hypertension S: controlled on tribenzor- prefers name brand BP Readings from Last 3 Encounters:  04/02/15 116/68  03/15/15 124/80  01/28/15 130/74  A/P: refilled med x 1 year   Hypothyroidism S:  controlled on synthroid 223mcg- name brand. Patient recently picked up rx which was not name brand Lab Results  Component Value Date   TSH 0.67 02/08/2015  A/P: filled as synthroid as DAW. Patient to complete current rx then change back to synthroid   Candidal intertrigo S: recurrent issue. Patient would like to have aids available at home for self managemet A/P: already uses powders. Given fungal cream as next step. Fluconazole as last step   Migraine S: rare headaches but midrin knocks out quickly when used A/P: gave short term refill    3 month planned with sooner appointment as needed Return precautions advised.  R Plantar fascia- advised to see podiatry. Has responded to injections in past  Meds ordered this encounter  Medications  . SYNTHROID 200 MCG tablet    Sig: Take 1 tablet (200 mcg total) by mouth daily before breakfast.    Dispense:  90 tablet    Refill:  3  . TRIBENZOR 20-5-12.5 MG TABS    Sig: Take 1 tablet by mouth daily. Name brand    Dispense:  90 tablet    Refill:  3  . isometheptene-acetaminophen-dichloralphenazone (MIDRIN) 65-100-325 MG capsule    Sig: Take 1 capsule by mouth daily as needed for migraine.    Dispense:  30 capsule    Refill:  0  . fluconazole (DIFLUCAN) 150 MG tablet    Sig: Take 1 tablet (150 mg total) by mouth once. Take again 3 days later    Dispense:  2 tablet    Refill:  0  . clotrimazole (LOTRIMIN) 1 % cream    Sig: Apply 1 application topically 2 (two) times daily. As needed for fungal skin infections    Dispense:  113 g    Refill:  1

## 2015-04-02 NOTE — Assessment & Plan Note (Signed)
S: controlled on tribenzor- prefers name brand BP Readings from Last 3 Encounters:  04/02/15 116/68  03/15/15 124/80  01/28/15 130/74  A/P: refilled med x 1 year

## 2015-04-02 NOTE — Progress Notes (Signed)
Pre visit review using our clinic review tool, if applicable. No additional management support is needed unless otherwise documented below in the visit note. 

## 2015-04-02 NOTE — Assessment & Plan Note (Signed)
S: rare headaches but midrin knocks out quickly when used A/P: gave short term refill

## 2015-04-08 DIAGNOSIS — M6281 Muscle weakness (generalized): Secondary | ICD-10-CM | POA: Diagnosis not present

## 2015-04-08 DIAGNOSIS — I890 Lymphedema, not elsewhere classified: Secondary | ICD-10-CM | POA: Diagnosis not present

## 2015-04-08 DIAGNOSIS — R601 Generalized edema: Secondary | ICD-10-CM | POA: Diagnosis not present

## 2015-04-09 DIAGNOSIS — I890 Lymphedema, not elsewhere classified: Secondary | ICD-10-CM | POA: Diagnosis not present

## 2015-04-09 DIAGNOSIS — M6281 Muscle weakness (generalized): Secondary | ICD-10-CM | POA: Diagnosis not present

## 2015-04-09 DIAGNOSIS — R601 Generalized edema: Secondary | ICD-10-CM | POA: Diagnosis not present

## 2015-04-12 DIAGNOSIS — R601 Generalized edema: Secondary | ICD-10-CM | POA: Diagnosis not present

## 2015-04-12 DIAGNOSIS — M6281 Muscle weakness (generalized): Secondary | ICD-10-CM | POA: Diagnosis not present

## 2015-04-12 DIAGNOSIS — I890 Lymphedema, not elsewhere classified: Secondary | ICD-10-CM | POA: Diagnosis not present

## 2015-04-14 ENCOUNTER — Telehealth: Payer: Self-pay | Admitting: Family Medicine

## 2015-04-14 DIAGNOSIS — R601 Generalized edema: Secondary | ICD-10-CM | POA: Diagnosis not present

## 2015-04-14 DIAGNOSIS — I890 Lymphedema, not elsewhere classified: Secondary | ICD-10-CM | POA: Diagnosis not present

## 2015-04-14 DIAGNOSIS — M6281 Muscle weakness (generalized): Secondary | ICD-10-CM | POA: Diagnosis not present

## 2015-04-14 NOTE — Telephone Encounter (Signed)
Error cancel

## 2015-04-16 ENCOUNTER — Encounter: Payer: Self-pay | Admitting: Occupational Therapy

## 2015-04-16 ENCOUNTER — Encounter: Payer: Self-pay | Admitting: Family Medicine

## 2015-04-16 ENCOUNTER — Ambulatory Visit: Payer: Medicare Other | Attending: Family Medicine | Admitting: Occupational Therapy

## 2015-04-16 DIAGNOSIS — I890 Lymphedema, not elsewhere classified: Secondary | ICD-10-CM | POA: Diagnosis not present

## 2015-04-16 NOTE — Patient Instructions (Signed)

## 2015-04-16 NOTE — Therapy (Signed)
[place] MAIN [facility] SERVICES [address] [place], [place], [address] Phone: [phone]   Fax:  [phone]  Occupational Therapy Evaluation  Patient Details  Name: Tamara Morales MRN: [id number] Date of Birth: 30-Jan-1931 Referring Provider: Garret Reddish, MD  Encounter Date: 04/16/2015      OT End of Session - 04/16/15 1705    Visit Number 1   Number of Visits 36   Date for OT Re-Evaluation 07/15/15   OT Start Time 0930   OT Stop Time 1100   OT Time Calculation (min) 90 min   Activity Tolerance Patient tolerated treatment well   Behavior During Therapy [facility] for tasks assessed/performed      Past Medical History  Diagnosis Date  . Morbid obesity ([place])   . MIGRAINE HEADACHE   . ARTHRITIS, KNEES, BILATERAL   . DEPRESSION   . GERD   . HYPERLIPIDEMIA   . HYPERTENSION   . DJD (degenerative joint disease) of knee   . Urge incontinence   . HYPOTHYROIDISM     postsurgical  . OSA (obstructive sleep apnea) 01/17/2011 dx  . Hypoxia   . Oxygen dependent   . Breast CA ([place]) 2000    s/p R lumpectomy and XRT  . UTI (lower urinary tract infection) 12/2014    Past Surgical History  Procedure Laterality Date  . Cholecystectomy  1995  . Breast lumpectomy  2000  . Thyroidectomy    . Breast biopsy  2000  . Replacement total knee bilateral  1999, 2005  . Tubal ligation    . Parathyroidectomy      2-3 removed  . Tonsillectomy    . Adenoidectomy    . Right leg femur repaired    . Abdominal hysterectomy      There were no vitals filed for this visit.  Visit Diagnosis:  Lymphedema - Plan: Ot plan of care cert/re-cert      Subjective Assessment - 04/16/15 1224    Subjective  Pt is referred for evaluation and treatment of BLE Lymphedema with reported onset "several years ago" during pregnancy. Pt noticed recent exacerbation after a fall when she scraped her leg. Pt also reports she also noticed frequent weight fluctuations of ~  10# in a day with negative impact on leg swelling.    Patient is accompained by: Family member   Pertinent History LE PRECAUTIONS: HYPOTHYROIDISM & HX ACUTE CHRONIC RESPIRATORY FAILURE, HX R BREAST CA . Fluid retention 2/2 to possible CHF or Kidney disease? - Pt to seek MD advice to explore these possible issues and will seek medical clearance before commenceing CDT. Contributing factors include, OSA ( uses CPap), hx multiple traumas including BLE fractures s/p  TKA and  multiple falls, morbid obesity, suspected CVIessential HTN,    Limitations difficulty walking, decreased functional mobility, decreased LE sttrength, decreased standing tolerance, decreased balance, unable to reach toes for bathing, dressing, skin care,    Patient Stated Goals reduce leg swelling and keep it from getting worse   Currently in Pain? Yes   Pain Score 6    Pain Location Leg   Pain Orientation Right;Left   Pain Descriptors / Indicators Aching;Discomfort;Heaviness;Pressure;Tightness;Tiring   Pain Type Chronic pain   Pain Onset More than a month ago   Pain Frequency Intermittent   Aggravating Factors  standing, walking   Pain Relieving Factors elevation   Effect of Pain on Daily Activities limits ability to complete self care, to perform home management tasks and activities, to participate  in leisure and productive activities at residential home and in [place] OT Assessment - 04/16/15 0001    Assessment   Diagnosis Moderate, BLE lymphedema secondary to suspected CVI and Morbid Obesity. Contributing factors include multiple traumatic injuries 2/2 falls, OSA, OA related inflamation, and decreased mobility.   Referring Provider Garret Reddish, MD   Onset Date 07/16/61   Assessment leg swelling onset during pregnacy w/ frequent exacerbations; denies hx DVT , infections, venous ulcers   Prior Therapy none   Precautions   Precautions Other (comment)  Hypothyroid, Respiratory, possible cardiac?    Balance Screen   Has the patient fallen in the past 6 months Yes   How many times? 1   Home  Environment   Family/patient expects to be discharged to: Other (comment)   Alternate Level Stairs - [facility] of Steps Independent Living Apartment in [employer]   Additional Comments power wheelchair   Prior Function   Vocation Retired   ADL   Lower Body Bathing Modified independent   Lower Body Dressing Modified independent   Transfers/Ambulation Related to ADL's power wc; bilateral canes for transfers   IADL   [place] for transportation;Needs to be accompanied on any shopping trip   Light Housekeeping [place] small items, rinses stockings, etc.;Needs help with all home maintenance tasks   Meal Prep Able to complete simple warm meal prep;Able to complete simple cold meal and snack prep   Community Mobility Drives own vehicle   Mobility   Mobility Status History of falls   Written Expression   Dominant Hand Right   Observation/Other Assessments   Skin Integrity skin below knees is  Dry, flaking and redddened w/ pebble-like texture 2/2 scattered lymphatic cysts, R>L.  Legs are tender to palpation w/ faint hemociderine staining. Stemmer sign is strong positive at base of toes. Legs present w/ dense, induration and palpable fibrosis distally. Skin on feet and ankles is tight causing decreased flexibility and mildly limited ankle AROM.  Tissue from knees to groin presents w/ mild palpable swelling and fatty fibrosis.          LYMPHEDEMA/ONCOLOGY QUESTIONNAIRE - 04/16/15 1700    Surgeries   Lumpectomy Date 07/16/98   Sentinel Lymph Node Biopsy Date 07/16/98   Treatment   Active Chemotherapy Treatment No   Past Chemotherapy Treatment No   Active Radiation Treatment No   Past Radiation Treatment No   Current Hormone Treatment No   Past Hormone Therapy No   What other symptoms do you have   Are you Having Heaviness or Tightness No   Are you having Pain No   Are you  having pitting edema No   Is it Hard or Difficult finding clothes that fit No   Do you have infections No   Is there Decreased scar mobility No   Stemmer Sign No   Other Symptoms occasional swelling post surgically is resolved   Lymphedema Assessments   Lymphedema Assessments Lower extremities   Right Lower Extremity Lymphedema   Other BLE comparative limb volumetrics to be assessed Rx visit 1                OT Treatments/Exercises (OP) - 04/16/15 0001    Transfers   Transfers Sit to Stand   Sit to Stand 6: Modified independent (Device/Increase time)   Manual Therapy   Manual Therapy Edema management  OT Education - 04/16/15 1227    Education provided (p) Yes   Education Details (p) Provided Pt/caregiver skilled education and ADL training throughout visit for lymphedema etiology, progression, and treatment including Intensive and Management Phase Complete Decongestive Therapy (CDT)  Discussed lymphedema precautions, cellulitis risk, and all CDT and LE self-care components, including compression wrapping/ garments & devices, lymphatic pumping ther ex, simple self-MLD, and skin care.    Person(s) Educated (p) Patient;Child(ren);Caregiver(s)   Methods (p) Explanation;Demonstration;Tactile cues;Handout;Verbal cues   Comprehension (p) Verbalized understanding;Returned demonstration;Verbal cues required;Tactile cues required;Need further instruction             OT Long Term Goals - 04/16/15 1726    OT LONG TERM GOAL #1   Title Pt able to correctly apply gradient compression wraps to below knee with maximum assistance from caregiver within 2 weeks for optimal limb volume reduction.   Baseline dependent   Time 2   Period Weeks   Status New   OT LONG TERM GOAL #2   Title Pt to achieve at least 10% limb volume reductions bilaterally to limit LE progression and infection risk, to decrease pain/discomfort (from 7 to </=3), and to improve standing, walking  tolerance to improve functional performance of  basic and instrumental ADLs.   Baseline dependent   Time 12   Period Weeks   Status New   OT LONG TERM GOAL #3   Title Pt > 75 % compliant with all daily LE self-care protocols, including simple self-manual lymphatic drainage (MLD), skin care, lymphatic pumping ther ex, and donning/ doffing progression garments with needed level of caregiver assistance to limit progression, infection risk and further functional decline.     Baseline dependent   Time 12   Period Weeks   Status New   OT LONG TERM GOAL #4   Title Pt to tolerate compression garments and devices in keeping w/ prescribed wear regime within 1 week of issue date to retain clinical and functional gains and to limit progression.   Baseline dependent   Time 12   Period Weeks   Status New   OT LONG TERM GOAL #5   Title During Management Phase CDT Pt to sustain limb volume reductions achieved during Intensive Phase CDT within 5% utilizing LE self-care protocols, appropriate compression garments/ devices, and needed level of caregiver assistance   Baseline dependent   Time 6   Period Months   Status New               Plan - 04/16/15 1707    Clinical Impression Statement Pt presents w/ moderate, stage II,  BLE Lymphedema (LE) 2/2 CVI and morbid obesity. Pt also presents with multible comorbidities that exacerbate and contribute to this chronic, progressive condition. Becaue Pt reports ongoing, frequent , ~ 10 pound weight fluctuations it is prudent to consult with her physician to rule out possible undiagnosed systemic dysfunction which may prove to be contraindications for Complete Decongestive Therapy, (CDT) the standard of care for lymphedema treatment. Pt agrees to seek a medical release from the referring physician prior to commencing CDT to first the RLE and then the LLE.   LE limits functional performance in all occupational domains, including basic and instrumental ADLs, (  difficulty fitting LB clothing and street shoes, bathing feet and distal legs, performing household choresk) productive activities,  (difficulty with all daily activities requiring extended standing and walking, and difficulty participation in family , home and community activitiesd community participation. Without skilled Occupational Therapy for Intensive  and Management phase CDT to address chronic, progressive BLE LE, this patient's condition is expected to worsen and further functional decline is likely.   Rehab Potential Good   OT Frequency 3x / week   OT Duration Other (comment)  and PRN to complete CDT to one leg at a time   OT Treatment/Interventions --  skin care w/ low ph lotion and castor oil   Plan 1. Pt to see physician ASAP before commencing CDT to rule out contraindications, including possible CHF and / or kidney disease. 2. Pt to obtain medical release prior to starting CDT to one leg at a time. 3. first 2-3 visits will emphasise comparative volumetrics and Pt and  caregiver training for proper application of toes to groin  gradient compression wraps..4. Once  plateau is reached in swelling reduction, Pt will be fit with appropriate compression garments and devices,. 5. Then CDT to commence to LLE.          G-Codes - 2015/04/19 1731    Functional Assessment Tool Used clinical observation, palpation, medical history, interview   Functional Limitation Self care   Self Care Current Status [phone]) 100 percent impaired, limited or restricted   Self Care Goal Status (L7989) At least 40 percent but less than 60 percent impaired, limited or restricted      Problem List Patient Active Problem List   Diagnosis Date Noted  . Candidal intertrigo 04/02/2015  . Acute on chronic respiratory failure with hypoxia ([place])   . Sepsis secondary to UTI ([place])   . Atypical chest pain 12/28/2014  . Obesity hypoventilation syndrome ([place]) 11/27/2014  . Hyperglycemia 07/29/2014  . Edema 05/29/2014  .  Allergic rhinitis 05/29/2014  . History of breast cancer 05/29/2014  . DOE (dyspnea on exertion) 12/05/2013  . Sleep-related hypoventilation 05/27/2013  . Panic attacks 03/19/2013  . OSA (obstructive sleep apnea) 01/17/2011  . Morbid obesity ([place]) 10/04/2009  . Depression 10/04/2009  . Migraine 10/04/2009  . Osteoarthritis 10/04/2009  . Hypothyroidism 08/05/2009  . Hyperlipidemia 08/05/2009  . Essential hypertension 08/05/2009  . GERD 08/05/2009   Andrey Spearman, MS, OTR/L, [facility] 04/19/15 5:41 PM   [place] MAIN [facility] SERVICES [address] [place], [place], [address] Phone: [phone]   Fax:  [phone]  Name: Tamara Morales MRN: [id number] Date of Birth: Jan 04, 1931

## 2015-04-21 ENCOUNTER — Ambulatory Visit: Payer: Medicare Other | Attending: Family Medicine | Admitting: Occupational Therapy

## 2015-04-21 DIAGNOSIS — I890 Lymphedema, not elsewhere classified: Secondary | ICD-10-CM | POA: Insufficient documentation

## 2015-04-22 ENCOUNTER — Encounter (HOSPITAL_BASED_OUTPATIENT_CLINIC_OR_DEPARTMENT_OTHER): Payer: Medicare Other | Attending: Internal Medicine

## 2015-04-22 ENCOUNTER — Ambulatory Visit: Payer: Self-pay | Admitting: Family Medicine

## 2015-04-22 DIAGNOSIS — I10 Essential (primary) hypertension: Secondary | ICD-10-CM | POA: Diagnosis not present

## 2015-04-22 DIAGNOSIS — G629 Polyneuropathy, unspecified: Secondary | ICD-10-CM | POA: Diagnosis not present

## 2015-04-22 DIAGNOSIS — I890 Lymphedema, not elsewhere classified: Secondary | ICD-10-CM | POA: Diagnosis not present

## 2015-04-22 DIAGNOSIS — G4730 Sleep apnea, unspecified: Secondary | ICD-10-CM | POA: Insufficient documentation

## 2015-04-22 DIAGNOSIS — Z923 Personal history of irradiation: Secondary | ICD-10-CM | POA: Diagnosis not present

## 2015-04-22 DIAGNOSIS — Z96653 Presence of artificial knee joint, bilateral: Secondary | ICD-10-CM | POA: Diagnosis not present

## 2015-04-22 DIAGNOSIS — I87333 Chronic venous hypertension (idiopathic) with ulcer and inflammation of bilateral lower extremity: Secondary | ICD-10-CM | POA: Diagnosis not present

## 2015-04-22 DIAGNOSIS — Z87891 Personal history of nicotine dependence: Secondary | ICD-10-CM | POA: Insufficient documentation

## 2015-04-22 NOTE — Patient Instructions (Signed)
1. Call wound clinic and request consult ASAP to limit infection risk and promote LLE wound healing  2. LE instructions and precautions as established- see initial eval.

## 2015-04-22 NOTE — Therapy (Signed)
[place] [facility] MAIN [facility] SERVICES [address] [place], [place], [address] Phone: [phone]   Fax:  [phone]  Occupational Therapy Treatment  Patient Details  Name: Tamara Morales MRN: [id number] Date of Birth: 21-May-1931 Referring Provider: Tana Conch, MD  Encounter Date: 04/21/2015      OT End of Session - 04/22/15 1428    Visit Number 2   Number of Visits 36   Date for OT Re-Evaluation 07/15/15   OT Start Time 1410   OT Stop Time 1515   OT Time Calculation (min) 65 min   Activity Tolerance Patient tolerated treatment well   Behavior During Therapy [facility] for tasks assessed/performed      Past Medical History  Diagnosis Date  . Morbid obesity ([facility])   . MIGRAINE HEADACHE   . ARTHRITIS, KNEES, BILATERAL   . DEPRESSION   . GERD   . HYPERLIPIDEMIA   . HYPERTENSION   . DJD (degenerative joint disease) of knee   . Urge incontinence   . HYPOTHYROIDISM     postsurgical  . OSA (obstructive sleep apnea) 01/17/2011 dx  . Hypoxia   . Oxygen dependent   . Breast CA ([facility]) 2000    s/p R lumpectomy and XRT  . UTI (lower urinary tract infection) 12/2014    Past Surgical History  Procedure Laterality Date  . Cholecystectomy  1995  . Breast lumpectomy  2000  . Thyroidectomy    . Breast biopsy  2000  . Replacement total knee bilateral  1999, 2005  . Tubal ligation    . Parathyroidectomy      2-3 removed  . Tonsillectomy    . Adenoidectomy    . Right leg femur repaired    . Abdominal hysterectomy      There were no vitals filed for this visit.  Visit Diagnosis:  Lymphedema      Subjective Assessment - 04/21/15 1420    Subjective  (p) Pt attends OT visit 2 for  treatment of BLE Lymphedema with reported onset "several years ago" during pregnancy. Pt reports she has appointment with her cardiologist on Friday, 11/11 to discuss systemic fluid retention and medical clearance in keeping w/ LE cardiac precautions.   Patient is  accompained by: (p) Family member   Pertinent History (p) LE PRECAUTIONS: HYPOTHYROIDISM & HX ACUTE CHRONIC RESPIRATORY FAILURE, HX R BREAST CA . Fluid retention 2/2 to possible CHF or Kidney disease? - Pt to seek MD advice to explore these possible issues and will seek medical clearance before commenceing CDT. Contributing factors include, OSA ( uses CPap), hx multiple traumas including BLE fractures s/p  TKA and  multiple falls, morbid obesity, suspected CVIessential HTN,    Limitations (p) difficulty walking, decreased functional mobility, decreased LE sttrength, decreased standing tolerance, decreased balance, unable to reach toes for bathing, dressing, skin care,    Patient Stated Goals (p) reduce leg swelling and keep it from getting worse   Pain Score (p) 4    Pain Location (p) Leg   Pain Orientation (p) Right;Left   Pain Descriptors / Indicators (p) Burning;Constant;Discomfort;Heaviness;Numbness;Pins and needles;Sore;Tender;Tightness  pt reports "nerve firing pain"   Pain Type (p) Chronic pain   Pain Onset (p) More than a month ago   Pain Frequency (p) Intermittent   Aggravating Factors  (p) standing walking   Pain Relieving Factors (p) none             LYMPHEDEMA/ONCOLOGY QUESTIONNAIRE - 04/22/15 1350    What other  symptoms do you have   Other Symptoms BLE lymphorrhea- lymphatic fluid dripping onto floor during Rx. LLE skin appears macerated w/ signs of  beiginning breakdown.   Right Lower Extremity Lymphedema   Other RLE below knee (A-D) limb volume=10,817.48 ml; toes to groin (A-G)= 21032.29 ml    Left Lower Extremity Lymphedema   Other NT 2/2 new skin breakdown distal leg                 OT Treatments/Exercises (OP) - 04/22/15 0001    ADLs   ADL Education Given Yes   Manual Therapy   Manual Therapy Edema management;Compression Bandaging;Other (comment)   Edema Management Comparative limb volumetrics- RLE only 2/2 new skin breakdown on distal LLE since last  visit   Compression Bandaging LLE gradient compression wraps applied circumferentially as follows from toes to below knee: toe wrap x1 under cotton stockinett; 8 cm x 1 to foot and ankle, + 10 cm x 2, , all layered in gradient over .04 x 10 cm Rosidol Soft x 1.rollfoam from A-D.                OT Education - 04/22/15 1427    Education Details Demonstrated knee length gradient compression wrapping for Pt and caregivers today. Explained technique and rational.   Person(s) Educated Patient;Child(ren);Caregiver(s)   Methods Explanation;Demonstration;Tactile cues;Verbal cues;Handout   Comprehension Verbalized understanding;Need further instruction             OT Long Term Goals - 04/16/15 1726    OT LONG TERM GOAL #1   Title Pt able to correctly apply gradient compression wraps to below knee with maximum assistance from caregiver within 2 weeks for optimal limb volume reduction.   Baseline dependent   Time 2   Period Weeks   Status New   OT LONG TERM GOAL #2   Title Pt to achieve at least 10% limb volume reductions bilaterally to limit LE progression and infection risk, to decrease pain/discomfort (from 7 to </=3), and to improve standing, walking tolerance to improve functional performance of  basic and instrumental ADLs.   Baseline dependent   Time 12   Period Weeks   Status New   OT LONG TERM GOAL #3   Title Pt > 75 % compliant with all daily LE self-care protocols, including simple self-manual lymphatic drainage (MLD), skin care, lymphatic pumping ther ex, and donning/ doffing progression garments with needed level of caregiver assistance to limit progression, infection risk and further functional decline.     Baseline dependent   Time 12   Period Weeks   Status New   OT LONG TERM GOAL #4   Title Pt to tolerate compression garments and devices in keeping w/ prescribed wear regime within 1 week of issue date to retain clinical and functional gains and to limit progression.    Baseline dependent   Time 12   Period Weeks   Status New   OT LONG TERM GOAL #5   Title During Management Phase CDT Pt to sustain limb volume reductions achieved during Intensive Phase CDT within 5% utilizing LE self-care protocols, appropriate compression garments/ devices, and needed level of caregiver assistance   Baseline dependent   Time 6   Period Months   Status New               Plan - 04/22/15 1430    Clinical Impression Statement Completed RLE comparative limb volumetrics only today as LLE presenting w/ inial stages of skin breakdown  and BLE lymphhorhea today. Pt strongly encouraged to see wound care specialist during visit interval. Pt unable to left legs to complete bed transfer today and has significant difficulty with bed mobility on hard plinth for repositioning  for Rx . Caregivers and Pt were attentive to wrap demonstration. Will coach them during practice next session.   Rehab Potential Good   OT Frequency 3x / week   OT Duration Other (comment)  and PRN to complete CDT to one leg at a time   OT Treatment/Interventions Self-care/ADL training;Therapeutic exercise;Functional Mobility Training;Patient/family education;Manual Therapy;Manual lymph drainage;Other (comment);DME and/or AE instruction;Compression bandaging;Therapeutic activities  skin care w/ low ph lotion and castor oil        Problem List Patient Active Problem List   Diagnosis Date Noted  . Candidal intertrigo 04/02/2015  . Acute on chronic respiratory failure with hypoxia ([place])   . Sepsis secondary to UTI ([place])   . Atypical chest pain 12/28/2014  . Obesity hypoventilation syndrome ([place]) 11/27/2014  . Hyperglycemia 07/29/2014  . Edema 05/29/2014  . Allergic rhinitis 05/29/2014  . History of breast cancer 05/29/2014  . DOE (dyspnea on exertion) 12/05/2013  . Sleep-related hypoventilation 05/27/2013  . Panic attacks 03/19/2013  . OSA (obstructive sleep apnea) 01/17/2011  . Morbid obesity  ([place]) 10/04/2009  . Depression 10/04/2009  . Migraine 10/04/2009  . Osteoarthritis 10/04/2009  . Hypothyroidism 08/05/2009  . Hyperlipidemia 08/05/2009  . Essential hypertension 08/05/2009  . GERD 08/05/2009    Andrey Spearman, MS, OTR/L, [facility] 04/22/2015 2:36 PM   [place] MAIN [facility] SERVICES [address] [place], [place], [address] Phone: [phone]   Fax:  [phone]  Name: Tamara Morales MRN: [id number] Date of Birth: Jul 06, 1930

## 2015-04-23 ENCOUNTER — Ambulatory Visit: Payer: Medicare Other | Admitting: Occupational Therapy

## 2015-04-26 DIAGNOSIS — G629 Polyneuropathy, unspecified: Secondary | ICD-10-CM | POA: Diagnosis not present

## 2015-04-26 DIAGNOSIS — I10 Essential (primary) hypertension: Secondary | ICD-10-CM | POA: Diagnosis not present

## 2015-04-26 DIAGNOSIS — Z87891 Personal history of nicotine dependence: Secondary | ICD-10-CM | POA: Diagnosis not present

## 2015-04-26 DIAGNOSIS — G4730 Sleep apnea, unspecified: Secondary | ICD-10-CM | POA: Diagnosis not present

## 2015-04-26 DIAGNOSIS — I890 Lymphedema, not elsewhere classified: Secondary | ICD-10-CM | POA: Diagnosis not present

## 2015-04-26 DIAGNOSIS — Z923 Personal history of irradiation: Secondary | ICD-10-CM | POA: Diagnosis not present

## 2015-04-29 ENCOUNTER — Encounter: Payer: Medicare Other | Admitting: Occupational Therapy

## 2015-04-29 DIAGNOSIS — Z87891 Personal history of nicotine dependence: Secondary | ICD-10-CM | POA: Diagnosis not present

## 2015-04-29 DIAGNOSIS — I890 Lymphedema, not elsewhere classified: Secondary | ICD-10-CM | POA: Diagnosis not present

## 2015-04-29 DIAGNOSIS — I10 Essential (primary) hypertension: Secondary | ICD-10-CM | POA: Diagnosis not present

## 2015-04-29 DIAGNOSIS — G4730 Sleep apnea, unspecified: Secondary | ICD-10-CM | POA: Diagnosis not present

## 2015-04-29 DIAGNOSIS — Z923 Personal history of irradiation: Secondary | ICD-10-CM | POA: Diagnosis not present

## 2015-04-29 DIAGNOSIS — G629 Polyneuropathy, unspecified: Secondary | ICD-10-CM | POA: Diagnosis not present

## 2015-04-30 ENCOUNTER — Encounter: Payer: Self-pay | Admitting: Cardiology

## 2015-04-30 ENCOUNTER — Ambulatory Visit (INDEPENDENT_AMBULATORY_CARE_PROVIDER_SITE_OTHER): Payer: Medicare Other | Admitting: Cardiology

## 2015-04-30 VITALS — BP 140/80 | HR 88 | Ht 65.0 in | Wt 310.0 lb

## 2015-04-30 DIAGNOSIS — Z79899 Other long term (current) drug therapy: Secondary | ICD-10-CM | POA: Diagnosis not present

## 2015-04-30 DIAGNOSIS — R609 Edema, unspecified: Secondary | ICD-10-CM

## 2015-04-30 MED ORDER — TORSEMIDE 20 MG PO TABS: 20.0000 mg | ORAL_TABLET | Freq: Every day | ORAL | Status: DC

## 2015-04-30 MED ORDER — POTASSIUM CHLORIDE CRYS ER 20 MEQ PO TBCR: 40.0000 meq | EXTENDED_RELEASE_TABLET | Freq: Every day | ORAL | Status: DC

## 2015-04-30 NOTE — Progress Notes (Signed)
Cardiology Office Note   Date:  04/30/2015   ID:  Tamara Morales, DOB 1930/08/18, MRN [id number]  PCP:  Garret Reddish, MD  Cardiologist:   Minus Breeding, MD   Chief Complaint  Patient presents with  . Edema      History of Present Illness: Tamara Morales is a 81 y.o. female who presents for evaluation of edema. She has a history of cardiac catheterizations in the distant past. She reports that these are normal. I do see a stress perfusion study from 2014 and was negative for any evidence of ischemia. She had a well preserved ejection fraction in the past. Her last EF 2014. She presents for lower externally swelling. This is been a chronic problem but worse over the past years peaking several months ago. She developed leg wounds and actually has been cared for at the wound clinic for venous stasis ulcers. She's having these rapid she says her swelling is much improved. She's been trying to keep her feet elevated. She does try to watch her salt. She drinks at least 60 ounces of fluid daily however. She did take Lasix for a while but it didn't seem to improve things so she stopped. She does not have PND or orthopnea. She denies any chest pressure, neck or arm discomfort. She has no palpitations, presyncope or syncope.  She lives at a retirement home in an apartment. She does walk around in the apartment and has been doing this better recently. She comes to her appointment in a motorized wheelchair.   Past Medical History  Diagnosis Date  . Morbid obesity ([place])   . MIGRAINE HEADACHE   . ARTHRITIS, KNEES, BILATERAL   . DEPRESSION   . GERD   . HYPERLIPIDEMIA   . HYPERTENSION   . DJD (degenerative joint disease) of knee   . Urge incontinence   . HYPOTHYROIDISM     postsurgical  . OSA (obstructive sleep apnea) 01/17/2011 dx  . Hypoxia   . Oxygen dependent   . Breast CA ([place]) 2000    s/p R lumpectomy and XRT  . UTI (lower urinary tract infection) 12/2014    Past Surgical  History  Procedure Laterality Date  . Cholecystectomy  1995  . Breast lumpectomy  2000  . Thyroidectomy    . Breast biopsy  2000  . Replacement total knee bilateral  1999, 2005  . Tubal ligation    . Parathyroidectomy      2-3 removed  . Tonsillectomy    . Adenoidectomy    . Right leg femur repaired    . Abdominal hysterectomy    . Cataract extraction       Current Outpatient Prescriptions  Medication Sig Dispense Refill  . ALPRAZolam (XANAX) 0.25 MG tablet TAKE 1/2 TO 1 TABLET BY MOUTH TWICE A DAY AS NEEDED FOR ANXIETY OR SLEEP 40 tablet 4  . aspirin 81 MG tablet Take 81 mg by mouth every morning.     . cetirizine (ZYRTEC) 10 MG tablet Take 10 mg by mouth as needed.     . clotrimazole (LOTRIMIN) 1 % cream Apply 1 application topically 2 (two) times daily. As needed for fungal skin infections 113 g 1  . FIBER PO Take 2 tablets by mouth every morning.     . fluconazole (DIFLUCAN) 150 MG tablet Take 1 tablet (150 mg total) by mouth once. Take again 3 days later 2 tablet 0  . fluticasone (FLONASE) 50 MCG/ACT nasal spray Place 2 sprays into  both nostrils daily. (Patient taking differently: Place 2 sprays into both nostrils as needed. ) 16 g 2  . isometheptene-acetaminophen-dichloralphenazone (MIDRIN) 65-100-325 MG capsule Take 1 capsule by mouth daily as needed for migraine. 30 capsule 0  . loratadine (CLARITIN) 10 MG tablet Take 10 mg by mouth daily as needed for allergies.     [name] nystatin (MYCOSTATIN/NYSTOP) 100000 UNIT/GM POWD APPLY TO AFFECTED AREA TWICE A DAY 120 g 2  . Omega-3 Fatty Acids (FISH OIL) 1200 MG CAPS Take 2,400 mg by mouth daily.     [name] omeprazole (PRILOSEC) 20 MG capsule TAKE 1 CAPSULE (20 MG TOTAL) BY MOUTH 2 (TWO) TIMES DAILY. 180 capsule 3  . SYNTHROID 200 MCG tablet Take 1 tablet (200 mcg total) by mouth daily before breakfast. 90 tablet 3  . TRIBENZOR 20-5-12.5 MG TABS Take 1 tablet by mouth daily. Name brand 90 tablet 3  . potassium chloride SA (KLOR-CON M20) 20  MEQ tablet Take 2 tablets (40 mEq total) by mouth daily. 60 tablet 6  . torsemide (DEMADEX) 20 MG tablet Take 1 tablet (20 mg total) by mouth daily. 30 tablet 6   No current facility-administered medications for this visit.    Allergies:   Azithromycin; Beta adrenergic blockers; Ciprofloxacin; Codeine; Epinephrine; Klonopin; Oxycodone; Paxil; Prednisone; Propoxyphene hcl; Meloxicam; and Vancomycin    Social History:  The patient  reports that she quit smoking about 40 years ago. Her smoking use included Cigarettes. She has a .5 pack-year smoking history. She has never used smokeless tobacco. She reports that she drinks alcohol. She reports that she does not use illicit drugs.   Family History:  The patient's family history includes Emphysema in her sister. There is no history of Coronary artery disease.    ROS:  Please see the history of present illness.   Otherwise, review of systems are positive for none.   All other systems are reviewed and negative.    PHYSICAL EXAM: VS:  BP 140/80 mmHg  Pulse 88  Ht $R'5\' 5"'WT$  (1.651 m)  Wt 310 lb (140.615 kg)  BMI 51.59 kg/m2 , BMI Body mass index is 51.59 kg/(m^2). PHYSICAL EXAM GEN:  No distress NECK:  No jugular venous distention at 90 degrees, waveform within normal limits, carotid upstroke brisk and symmetric, no bruits, no thyromegaly LYMPHATICS:  No cervical adenopathy LUNGS:  Clear to auscultation bilaterally BACK:  No CVA tenderness CHEST:  Unremarkable HEART:  S1 and S2 within normal limits, no S3, no S4, no clicks, no rubs, no murmurs ABD:  Positive bowel sounds normal in frequency in pitch, no bruits, no rebound, no guarding, unable to assess midline mass or bruit with the patient seated. EXT:  2 plus pulses throughout, moderate edema and lymphedema.  , no cyanosis no clubbing SKIN:  No rashes no nodules NEURO:  Cranial nerves II through XII grossly intact, motor grossly intact throughout PSYCH:  Cognitively intact, oriented to person  place and time     EKG:  EKG is not ordered today. The ekg ordered 01/07/15 demonstrates sinus tachycardia, rate 101, no acute ST T wave changes.  04/30/2015   Recent Labs: 01/07/2015: B Natriuretic Peptide 149.7* 01/10/2015: ALT 24; BUN 8; Creatinine, Ser 0.73; Hemoglobin 10.7*; Magnesium 1.8; Platelets 155; Potassium 3.7; Sodium 140 02/08/2015: TSH 0.67    Lipid Panel    Component Value Date/Time   CHOL 172 07/29/2014 1000   TRIG 159.0* 07/29/2014 1000   TRIG 65 07/27/2009   HDL 42.30 07/29/2014 1000   CHOLHDL  4 07/29/2014 1000   VLDL 31.8 07/29/2014 1000   LDLCALC 98 07/29/2014 1000   LDLDIRECT 99.4 03/13/2012 1133      Wt Readings from Last 3 Encounters:  04/30/15 310 lb (140.615 kg)  04/02/15 310 lb (140.615 kg)  03/15/15 307 lb (139.254 kg)      Other studies Reviewed: Additional studies/ records that were reviewed today include: Hospital records. Review of the above records demonstrates:  Please see elsewhere in the note.     ASSESSMENT AND PLAN:  EDEMA:  This is clearly multifactorial. There is a component of venous insufficiency. She has some lymphedema. She may well have some mild pulmonary hypertension with sleep apnea and obesity hypoxia syndrome. She's not particularly ambulatory.  I did look at labs done in July and her albumin and protein levels below. We discussed inserted therapies including restricting her fluid further and her salt further. She does a good job keeping her feet elevated. I'm going to send her to a nutritionist because of her obesity and low albumin and nutrition status. I am going to start Demadex 20 mg daily and increase her potassium. She'll come back in one week for basic metabolic profile.   Current medicines are reviewed at length with the patient today.  The patient does not have concerns regarding medicines.  The following changes have been made:  no change  Labs/ tests ordered today include:   Orders Placed This Encounter    Procedures  . Basic Metabolic Panel (BMET)     Disposition:   FU with 4 months.     Signed, Minus Breeding, MD  04/30/2015 5:30 PM    [place]

## 2015-04-30 NOTE — Patient Instructions (Signed)
Your physician wants you to follow-up in: 4 Months. You will receive a reminder letter in the mail two months in advance. If you don't receive a letter, please call our office to schedule the follow-up appointment.  Your physician recommends that you return for lab work in: 10 days BMP  Your physician has recommended you make the following change in your medication: START Torsemide(Demedex) 20 mg daily and INCREASE Potassium 40 mg daily

## 2015-05-01 ENCOUNTER — Encounter: Payer: Self-pay | Admitting: Cardiology

## 2015-05-04 ENCOUNTER — Other Ambulatory Visit: Payer: Self-pay | Admitting: Family Medicine

## 2015-05-06 ENCOUNTER — Encounter: Payer: Medicare Other | Admitting: Occupational Therapy

## 2015-05-06 DIAGNOSIS — I890 Lymphedema, not elsewhere classified: Secondary | ICD-10-CM | POA: Diagnosis not present

## 2015-05-06 DIAGNOSIS — G4730 Sleep apnea, unspecified: Secondary | ICD-10-CM | POA: Diagnosis not present

## 2015-05-06 DIAGNOSIS — Z87891 Personal history of nicotine dependence: Secondary | ICD-10-CM | POA: Diagnosis not present

## 2015-05-06 DIAGNOSIS — G629 Polyneuropathy, unspecified: Secondary | ICD-10-CM | POA: Diagnosis not present

## 2015-05-06 DIAGNOSIS — I10 Essential (primary) hypertension: Secondary | ICD-10-CM | POA: Diagnosis not present

## 2015-05-06 DIAGNOSIS — Z923 Personal history of irradiation: Secondary | ICD-10-CM | POA: Diagnosis not present

## 2015-05-07 ENCOUNTER — Encounter: Payer: Medicare Other | Admitting: Occupational Therapy

## 2015-05-10 ENCOUNTER — Encounter: Payer: Self-pay | Admitting: Family Medicine

## 2015-05-10 ENCOUNTER — Other Ambulatory Visit: Payer: Self-pay | Admitting: *Deleted

## 2015-05-10 ENCOUNTER — Telehealth: Payer: Self-pay | Admitting: Family Medicine

## 2015-05-10 ENCOUNTER — Encounter: Payer: Self-pay | Admitting: Cardiology

## 2015-05-10 MED ORDER — HYDROCHLOROTHIAZIDE 12.5 MG PO TABS: 12.5000 mg | ORAL_TABLET | Freq: Every day | ORAL | Status: DC

## 2015-05-10 MED ORDER — AMLODIPINE BESYLATE-VALSARTAN 5-160 MG PO TABS
1.0000 | ORAL_TABLET | Freq: Every day | ORAL | Status: DC
Start: 2015-05-10 — End: 2016-01-10

## 2015-05-10 MED ORDER — POTASSIUM CHLORIDE ER 10 MEQ PO CPCR
40.0000 meq | ORAL_CAPSULE | Freq: Every day | ORAL | Status: DC
Start: 2015-05-10 — End: 2015-08-16

## 2015-05-10 NOTE — Telephone Encounter (Signed)
I changed medicine to HCTZ 12.$RemoveBe'5mg'BaPHvLfAt$ , amlodipine $RemoveBefo'5mg'RZRNRAiJNJs$ - valsartan $RemoveBef'160mg'wboYwOBFJD$  and sent to pharmacy. Please inform patient. She can wait to change until January . Would advise visit to check blood pressure 2-3 weeks after change.

## 2015-05-10 NOTE — Telephone Encounter (Signed)
See below

## 2015-05-10 NOTE — Telephone Encounter (Signed)
Tamara Morales is calling to let md know tribenzor will not be cover medication starting 06-20-2015. The alternative will be amlodipine- valsartan w/hct.

## 2015-05-11 ENCOUNTER — Encounter: Payer: Self-pay | Admitting: Cardiology

## 2015-05-11 ENCOUNTER — Encounter: Payer: Medicare Other | Admitting: Occupational Therapy

## 2015-05-11 DIAGNOSIS — Z79899 Other long term (current) drug therapy: Secondary | ICD-10-CM | POA: Diagnosis not present

## 2015-05-11 NOTE — Telephone Encounter (Signed)
Pt.notified

## 2015-05-20 ENCOUNTER — Encounter (HOSPITAL_BASED_OUTPATIENT_CLINIC_OR_DEPARTMENT_OTHER): Payer: Medicare Other | Attending: Internal Medicine

## 2015-05-20 ENCOUNTER — Encounter: Payer: Self-pay | Admitting: Occupational Therapy

## 2015-05-20 DIAGNOSIS — H269 Unspecified cataract: Secondary | ICD-10-CM | POA: Insufficient documentation

## 2015-05-20 DIAGNOSIS — G4730 Sleep apnea, unspecified: Secondary | ICD-10-CM | POA: Diagnosis not present

## 2015-05-20 DIAGNOSIS — Z923 Personal history of irradiation: Secondary | ICD-10-CM | POA: Insufficient documentation

## 2015-05-20 DIAGNOSIS — I10 Essential (primary) hypertension: Secondary | ICD-10-CM | POA: Diagnosis not present

## 2015-05-20 DIAGNOSIS — Z09 Encounter for follow-up examination after completed treatment for conditions other than malignant neoplasm: Secondary | ICD-10-CM | POA: Insufficient documentation

## 2015-05-20 DIAGNOSIS — I890 Lymphedema, not elsewhere classified: Secondary | ICD-10-CM | POA: Diagnosis present

## 2015-05-20 DIAGNOSIS — Z872 Personal history of diseases of the skin and subcutaneous tissue: Secondary | ICD-10-CM | POA: Insufficient documentation

## 2015-05-20 DIAGNOSIS — G629 Polyneuropathy, unspecified: Secondary | ICD-10-CM | POA: Diagnosis not present

## 2015-05-21 ENCOUNTER — Encounter: Payer: Self-pay | Admitting: Occupational Therapy

## 2015-05-26 ENCOUNTER — Encounter: Payer: Self-pay | Admitting: Occupational Therapy

## 2015-05-27 ENCOUNTER — Encounter: Payer: Self-pay | Admitting: Occupational Therapy

## 2015-05-28 ENCOUNTER — Encounter: Payer: Self-pay | Admitting: Occupational Therapy

## 2015-06-02 ENCOUNTER — Encounter: Payer: Self-pay | Admitting: Occupational Therapy

## 2015-06-03 ENCOUNTER — Encounter: Payer: Self-pay | Admitting: Occupational Therapy

## 2015-06-04 ENCOUNTER — Encounter: Payer: Self-pay | Admitting: Occupational Therapy

## 2015-06-10 ENCOUNTER — Encounter: Payer: Self-pay | Admitting: Occupational Therapy

## 2015-06-15 ENCOUNTER — Encounter: Payer: Self-pay | Admitting: Occupational Therapy

## 2015-06-16 ENCOUNTER — Encounter: Payer: Self-pay | Admitting: Occupational Therapy

## 2015-06-18 ENCOUNTER — Encounter: Payer: Self-pay | Admitting: Occupational Therapy

## 2015-06-22 ENCOUNTER — Encounter: Payer: Self-pay | Admitting: Family Medicine

## 2015-06-22 ENCOUNTER — Other Ambulatory Visit: Payer: Self-pay

## 2015-06-22 ENCOUNTER — Encounter: Payer: Self-pay | Admitting: Occupational Therapy

## 2015-06-23 ENCOUNTER — Ambulatory Visit (HOSPITAL_BASED_OUTPATIENT_CLINIC_OR_DEPARTMENT_OTHER)
Admission: RE | Admit: 2015-06-23 | Discharge: 2015-06-23 | Disposition: A | Discharge status: Home / Self Care | Payer: Medicare Other | Source: Ambulatory Visit | Attending: Physician Assistant | Admitting: Physician Assistant

## 2015-06-23 ENCOUNTER — Other Ambulatory Visit: Payer: Self-pay

## 2015-06-23 ENCOUNTER — Other Ambulatory Visit: Payer: Self-pay | Admitting: Physician Assistant

## 2015-06-23 ENCOUNTER — Encounter: Payer: Self-pay | Admitting: Occupational Therapy

## 2015-06-23 ENCOUNTER — Ambulatory Visit (INDEPENDENT_AMBULATORY_CARE_PROVIDER_SITE_OTHER): Payer: Medicare Other | Admitting: Physician Assistant

## 2015-06-23 ENCOUNTER — Telehealth: Payer: Self-pay | Admitting: Family Medicine

## 2015-06-23 ENCOUNTER — Ambulatory Visit (INDEPENDENT_AMBULATORY_CARE_PROVIDER_SITE_OTHER): Payer: Medicare Other

## 2015-06-23 VITALS — BP 144/81 | HR 87 | Temp 98.0°F | Resp 17

## 2015-06-23 DIAGNOSIS — R739 Hyperglycemia, unspecified: Secondary | ICD-10-CM | POA: Diagnosis not present

## 2015-06-23 DIAGNOSIS — M79604 Pain in right leg: Secondary | ICD-10-CM | POA: Diagnosis not present

## 2015-06-23 DIAGNOSIS — R202 Paresthesia of skin: Secondary | ICD-10-CM | POA: Diagnosis not present

## 2015-06-23 DIAGNOSIS — I10 Essential (primary) hypertension: Secondary | ICD-10-CM

## 2015-06-23 DIAGNOSIS — M79605 Pain in left leg: Secondary | ICD-10-CM | POA: Diagnosis not present

## 2015-06-23 DIAGNOSIS — R209 Unspecified disturbances of skin sensation: Secondary | ICD-10-CM | POA: Diagnosis not present

## 2015-06-23 LAB — POC MICROSCOPIC URINALYSIS (UMFC): MUCUS RE: ABSENT

## 2015-06-23 LAB — POCT CBC
Granulocyte percent: 70 %G (ref 37–80)
HCT, POC: 43.6 % (ref 37.7–47.9)
Hemoglobin: 14.3 g/dL (ref 12.2–16.2)
Lymph, poc: 1.7 (ref 0.6–3.4)
MCH, POC: 28.4 pg (ref 27–31.2)
MCHC: 32.9 g/dL (ref 31.8–35.4)
MCV: 86.1 fL (ref 80–97)
MID (CBC): 0.5 (ref 0–0.9)
MPV: 9.4 fL (ref 0–99.8)
POC GRANULOCYTE: 5.1 (ref 2–6.9)
POC LYMPH %: 22.7 % (ref 10–50)
POC MID %: 7.3 % (ref 0–12)
Platelet Count, POC: 162 10*3/uL (ref 142–424)
RBC: 5.06 M/uL (ref 4.04–5.48)
RDW, POC: 16 %
WBC: 7.3 10*3/uL (ref 4.6–10.2)

## 2015-06-23 LAB — POCT URINALYSIS DIP (MANUAL ENTRY)
BILIRUBIN UA: NEGATIVE
BILIRUBIN UA: NEGATIVE
GLUCOSE UA: NEGATIVE
Leukocytes, UA: NEGATIVE
Nitrite, UA: NEGATIVE
Protein Ur, POC: NEGATIVE
SPEC GRAV UA: 1.01
Urobilinogen, UA: 0.2
pH, UA: 5.5

## 2015-06-23 LAB — GLUCOSE, POCT (MANUAL RESULT ENTRY): POC GLUCOSE: 105 mg/dL — AB (ref 70–99)

## 2015-06-23 LAB — POCT GLYCOSYLATED HEMOGLOBIN (HGB A1C): HEMOGLOBIN A1C: 6.2

## 2015-06-23 MED ORDER — TRAMADOL HCL 50 MG PO TABS: 50.0000 mg | ORAL_TABLET | Freq: Three times a day (TID) | ORAL | Status: DC | PRN

## 2015-06-23 NOTE — Telephone Encounter (Signed)
[facility] - Client [place] [facility]  Patient Name: Tamara Morales  DOB: Oct 04, 1930    Initial Comment Caller states her BP is way up and her right leg is hurting.    Nurse Assessment  Nurse: Wayne Sever, RN, Tillie Rung Date/Time (Eastern Time): 06/23/2015 3:32:05 PM  Confirm and document reason for call. If symptomatic, describe symptoms. ---Caller denies wanting triage. She states I already called and there was no appointments for me. She states her leg is hurting so bad she is on her way to Urgent care. She did not want triage  Has the patient traveled out of the country within the last 30 days? ---Not Applicable  Does the patient have any new or worsening symptoms? ---Yes  Will a triage be completed? ---No  Select reason for no triage. ---Patient declined     Guidelines    Guideline Title Affirmed Question Affirmed Notes       Final Disposition User

## 2015-06-23 NOTE — Patient Instructions (Signed)
GO OVER TO [facility] TO THE ER NOW AND LET THEM KNOW YOU ARE THERE TO REGISTER AS AN OUTPATIENT FOR A VENOUS [facility]  [facility]  [address] Bhat [place], [place] [address] [phone]

## 2015-06-23 NOTE — Progress Notes (Signed)
Subjective:   Patient ID: Tamara Morales, female     DOB: [birth year], 82 y.o.    MRN: [id number]  PCP: Garret Reddish, MD  Chief Complaint  Patient presents with  . Leg Pain    right side   . Hypertension    HPI  Presents for evaluation of RIGHT leg pain.  This patient, a resident of [place] is accompanied by her daughter, [name]. She is seated in a wheelchair.  Numbness sensation on the RIGHT leg began on Sunday 06/20/15 while driving. The symptoms resolved when she got out and moved around. The next day she was fitted for knee-high compression stockings. She describes a very painful experience and was not able to tolerate the stockings. Later that night she suddenly developed "muscular" pain in the upper leg/groin area (points to the upper inner thigh). Last night, the pain worsened, described as a burning "nerve-like" pain, and became unable to move her leg. At about midnight, she took 2 Aleve. After several hours, her pain suddenly went away. She kept waking up, and this morning she called the nurse, who advised her to rest, but was worried that her diastolic BP was 333 (she doesn't recall the systolic reading). The pain returned at about 10 am. When the nurse returned at 2-3 pm and her BP remained elevated, she was advised to seek evaluation here.  Was out of levothyroxine about 3 days last week.  Describes the inability to move her leg as separate from the pain. In order to walk, she has to hike her hip up. Unable to independently flex the hip or knee or ankle. The pain in her groin/upper inner thigh is described as an ache. The pain in the rest of her leg is a burning, pins and needles feeling.  She is concerned that she has a blood clot. She has no history of DVT or PE.  Reports that the swelling in her legs is not worse than her baseline lymphedema. She has not noticed any increased redness of her legs. No CP. No SOB. No dizziness.  In order to  stand, she needs her feet very wide apart. She then flexes forward at the waist so her chest is on her thighs, and pushes up with her hands on the wheel chair. When she does that, she has pain in there "hip" and points to the RIGHT SI joint space. Once standing, the pain resolves completely, and she can ambulate using 2 canes, one in each hand (of note, they are not set at the same height).  Prior to Admission medications   Medication Sig Start Date End Date Taking? Authorizing Provider  ALPRAZolam (XANAX) 0.25 MG tablet TAKE 1/2 TO 1 TABLET BY MOUTH TWICE A DAY AS NEEDED FOR ANXIETY OR SLEEP 02/24/15  Yes Marin Olp, MD  amLODipine-valsartan (EXFORGE) 5-160 MG tablet Take 1 tablet by mouth daily. 05/10/15  Yes Marin Olp, MD  aspirin 81 MG tablet Take 81 mg by mouth every morning.    Yes Historical Provider, MD  cetirizine (ZYRTEC) 10 MG tablet Take 10 mg by mouth as needed.    Yes Historical Provider, MD  clotrimazole (LOTRIMIN) 1 % cream Apply 1 application topically 2 (two) times daily. As needed for fungal skin infections 04/02/15  Yes Marin Olp, MD  FIBER PO Take 2 tablets by mouth every morning.    Yes Historical Provider, MD  fluconazole (DIFLUCAN) 150 MG tablet Take 1 tablet (150 mg total) by  mouth once. Take again 3 days later 04/02/15  Yes Marin Olp, MD  fluticasone [facility]) 50 MCG/ACT nasal spray Place 2 sprays into both nostrils daily. Patient taking differently: Place 2 sprays into both nostrils as needed.  01/11/15  Yes Chesley Mires, MD  furosemide (LASIX) 40 MG tablet TAKE 1 TABLET BY MOUTH EVERY DAY 05/04/15  Yes Marin Olp, MD  hydrochlorothiazide (HYDRODIURIL) 12.5 MG tablet Take 1 tablet (12.5 mg total) by mouth daily. 05/10/15  Yes Marin Olp, MD  isometheptene-acetaminophen-dichloralphenazone (MIDRIN) [phone] MG capsule Take 1 capsule by mouth daily as needed for migraine. 04/02/15  Yes Marin Olp, MD  loratadine (CLARITIN) 10 MG  tablet Take 10 mg by mouth daily as needed for allergies.    Yes Historical Provider, MD  nystatin (MYCOSTATIN/NYSTOP) 100000 UNIT/GM POWD APPLY TO AFFECTED AREA TWICE A DAY 11/25/14  Yes Marin Olp, MD  Omega-3 Fatty Acids (FISH OIL) 1200 MG CAPS Take 2,400 mg by mouth daily.    Yes Historical Provider, MD  omeprazole (PRILOSEC) 20 MG capsule TAKE 1 CAPSULE (20 MG TOTAL) BY MOUTH 2 (TWO) TIMES DAILY. 11/06/14  Yes Marin Olp, MD  potassium chloride (MICRO-K) 10 MEQ CR capsule Take 4 capsules (40 mEq total) by mouth daily. 05/10/15  Yes Minus Breeding, MD  SYNTHROID 200 MCG tablet Take 1 tablet (200 mcg total) by mouth daily before breakfast. 04/02/15  Yes Marin Olp, MD  torsemide (DEMADEX) 20 MG tablet Take 1 tablet (20 mg total) by mouth daily. 04/30/15  Yes Minus Breeding, MD     Allergies  Allergen Reactions  . Azithromycin Itching  . Beta Adrenergic Blockers     Depression   . Ciprofloxacin Other (See Comments)    REACTION: edgy and very jumpy   . Codeine Nausea And Vomiting  . Epinephrine Other (See Comments)    REACTION: rapid pulse, sweats  . Klonopin [Clonazepam] Other (See Comments)    Makes her feel very suicidal   . Oxycodone Other (See Comments)    Patient felt like it altered her mental status "Crazy" . Hallucinations later as well.  [name] Paxil [Paroxetine Hydrochloride] Other (See Comments)    Sever depression   . Prednisone     "makes me feel really bad"  . Propoxyphene Hcl Nausea And Vomiting  . Meloxicam Diarrhea  . Vancomycin Rash    Localized rash related to infusion rate.     Patient Active Problem List   Diagnosis Date Noted  . Candidal intertrigo 04/02/2015  . Acute on chronic respiratory failure with hypoxia ([place])   . Sepsis secondary to UTI ([place])   . Atypical chest pain 12/28/2014  . Obesity hypoventilation syndrome ([place]) 11/27/2014  . Hyperglycemia 07/29/2014  . Edema 05/29/2014  . Allergic rhinitis 05/29/2014  . History of breast  cancer 05/29/2014  . DOE (dyspnea on exertion) 12/05/2013  . Sleep-related hypoventilation 05/27/2013  . Panic attacks 03/19/2013  . OSA (obstructive sleep apnea) 01/17/2011  . Morbid obesity ([place]) 10/04/2009  . Depression 10/04/2009  . Migraine 10/04/2009  . Osteoarthritis 10/04/2009  . Hypothyroidism 08/05/2009  . Hyperlipidemia 08/05/2009  . Essential hypertension 08/05/2009  . GERD 08/05/2009     Family History  Problem Relation Age of Onset  . Emphysema Sister   . Coronary artery disease Neg Hx      Social History   Social History  . Marital Status: Married    Spouse Name: N/A  . Number of Children: 5  . Years  of Education: N/A   Occupational History  . RETIRED     worked on office   Social History Main Topics  . Smoking status: Former Smoker -- 0.25 packs/day for 2 years    Types: Cigarettes    Quit date: 06/19/1974  . Smokeless tobacco: Never Used     Comment: Quit in late 20's  . Alcohol Use: 0.0 oz/week    0 Standard drinks or equivalent per week     Comment: rare  . Drug Use: No  . Sexual Activity: No   Other Topics Concern  . Not on file   Social History Narrative   Married to husband that has dementia (he lives in the skilled nursing wing) and this is a big stressor. 5 children. 10 grandchildren. 2 greatgrandchildren. All children       Lives at [facility], in independent living.      Retired from multiple different Community education officer, university, Psychologist, educational.       Hobbies: Management consultant, write poetry, craft dolls        Review of Systems  Constitutional: Negative for activity change, appetite change, fatigue and unexpected weight change.  HENT: Negative for congestion, dental problem, ear pain, hearing loss, mouth sores, postnasal drip, rhinorrhea, sneezing, sore throat, tinnitus and trouble swallowing.   Eyes: Negative for photophobia, pain, redness and visual disturbance.  Respiratory: Negative for cough, chest tightness and  shortness of breath.   Cardiovascular: Positive for leg swelling. Negative for chest pain and palpitations.  Gastrointestinal: Negative for nausea, vomiting, abdominal pain, diarrhea, constipation and blood in stool.  Genitourinary: Negative for dysuria, urgency, frequency and hematuria.  Musculoskeletal: Positive for back pain and gait problem. Negative for myalgias, arthralgias and neck stiffness.  Skin: Negative for rash.  Neurological: Negative for dizziness, speech difficulty, weakness, light-headedness, numbness and headaches.  Hematological: Negative for adenopathy.  Psychiatric/Behavioral: Positive for sleep disturbance. Negative for confusion. The patient is not nervous/anxious.          Objective:  Physical Exam  Constitutional: She is oriented to person, place, and time. She appears well-developed and well-nourished. She is active and cooperative. No distress.  BP 144/81 mmHg  Pulse 87  Temp(Src) 98 F (36.7 C) (Oral)  Resp 17  SpO2 95%  HENT:  Head: Normocephalic and atraumatic.  Right Ear: Hearing and external ear normal.  Left Ear: Hearing and external ear normal.  Nose: Nose normal.  Mouth/Throat: Oropharynx is clear and moist.  Eyes: Conjunctivae are normal. No scleral icterus.  Neck: Normal range of motion. Neck supple. No thyromegaly present.  Cardiovascular: Normal rate, regular rhythm, normal heart sounds and intact distal pulses.   Pulses:      Radial pulses are 2+ on the right side, and 2+ on the left side.       Dorsalis pedis pulses are 1+ on the right side, and 1+ on the left side.  Pulmonary/Chest: Effort normal and breath sounds normal.  Abdominal: Soft. There is no tenderness.  Musculoskeletal:       Lumbar back: She exhibits decreased range of motion and pain. She exhibits no tenderness, no bony tenderness, no swelling and no edema.       Back:  Lymphadenopathy:       Head (right side): No tonsillar, no preauricular, no posterior auricular and no  occipital adenopathy present.       Head (left side): No tonsillar, no preauricular, no posterior auricular and no occipital adenopathy present.    She has  no cervical adenopathy.       Right: No supraclavicular adenopathy present.       Left: No supraclavicular adenopathy present.  Both lower legs with substantial swelling and skin changes consistent with the chronic swelling.  Neurological: She is alert and oriented to person, place, and time. No sensory deficit.  Skin: Skin is warm, dry and intact. No cyanosis or erythema. Nails show no clubbing.  Psychiatric: She has a normal mood and affect. Her speech is normal and behavior is normal.      Results for orders placed or performed in visit on 06/23/15  POCT CBC  Result Value Ref Range   WBC 7.3 4.6 - 10.2 K/uL   Lymph, poc 1.7 0.6 - 3.4   POC LYMPH PERCENT 22.7 10 - 50 %L   MID (cbc) 0.5 0 - 0.9   POC MID % 7.3 0 - 12 %M   POC Granulocyte 5.1 2 - 6.9   Granulocyte percent 70.0 37 - 80 %G   RBC 5.06 4.04 - 5.48 M/uL   Hemoglobin 14.3 12.2 - 16.2 g/dL   HCT, POC 43.6 37.7 - 47.9 %   MCV 86.1 80 - 97 fL   MCH, POC 28.4 27 - 31.2 pg   MCHC 32.9 31.8 - 35.4 g/dL   RDW, POC 16.0 %   Platelet Count, POC 162 142 - 424 K/uL   MPV 9.4 0 - 99.8 fL  POCT urinalysis dipstick  Result Value Ref Range   Color, UA yellow yellow   Clarity, UA clear clear   Glucose, UA negative negative   Bilirubin, UA negative negative   Ketones, POC UA negative negative   Spec Grav, UA 1.010    Blood, UA trace-intact (A) negative   pH, UA 5.5    Protein Ur, POC negative negative   Urobilinogen, UA 0.2    Nitrite, UA Negative Negative   Leukocytes, UA Negative Negative  POCT Microscopic Urinalysis ([facility])  Result Value Ref Range   WBC,UR,HPF,POC Few (A) None WBC/hpf   RBC,UR,HPF,POC None None RBC/hpf   Bacteria Few (A) None, Too numerous to count   Mucus Absent Absent   Epithelial Cells, UR Per Microscopy Few (A) None, Too numerous to count  cells/hpf  POCT glucose (manual entry)  Result Value Ref Range   POC Glucose 105 (A) 70 - 99 mg/dl     LS-SPINE and PELVIS: [facility] reading (PRIMARY) by  Dr. Marin Comment. Scoliosis. Significant diffuse degenerative disease of the lumbar spine. Normal pelvis other than the degenerative changes at L5-S1.       Assessment & Plan:  1. Paresthesia of right lower extremity 3. Pain of right lower extremity Likely due to the advanced degenerative changes of the lumbar spine seen on today's films. The pelvis is otherwise normal. LE doppler now at [facility]. Plan to start Xarelto if positive for DVT. Trial of tramadol for pain, as she has allergy/intolerance to other pain medications (notes she has tolerated morphine in the past). - Comprehensive metabolic panel - POCT glucose (manual entry) - DG Lumbar Spine Complete; Future - DG Pelvis 1-2 Views; Future - traMADol (ULTRAM) 50 MG tablet; Take 1 tablet (50 mg total) by mouth every 8 (eight) hours as needed.  Dispense: 30 tablet; Refill: 0   2. Essential hypertension Mildly elevated today, likely due to her discomfort. May follow-up with her PCP on this issue. - POCT CBC - POCT urinalysis dipstick - POCT Microscopic Urinalysis ([facility]) - Comprehensive metabolic panel -  TSH   4. Hyperglycemia Mild elevation. Follow-up with PCP for routine care. - POCT glycosylated hemoglobin (Hb A1C)   Fara Chute, PA-C Physician Assistant-Certified [facility]

## 2015-06-23 NOTE — Telephone Encounter (Signed)
Rx request from [facility].  Pt is transferring because she needs delivery.  Can pt have a new prescription for Alprazolam  .25 mg- Take 1/2 to 1 tablet twice daily for sleep (anxiety).  Pls advise.

## 2015-06-24 ENCOUNTER — Telehealth: Payer: Self-pay | Admitting: Physician Assistant

## 2015-06-24 LAB — COMPREHENSIVE METABOLIC PANEL
ALBUMIN: 4.1 g/dL (ref 3.6–5.1)
ALK PHOS: 85 U/L (ref 33–130)
ALT: 17 U/L (ref 6–29)
AST: 21 U/L (ref 10–35)
BUN: 11 mg/dL (ref 7–25)
CO2: 28 mmol/L (ref 20–31)
CREATININE: 0.79 mg/dL (ref 0.60–0.88)
Calcium: 8.9 mg/dL (ref 8.6–10.4)
Chloride: 101 mmol/L (ref 98–110)
Glucose, Bld: 94 mg/dL (ref 65–99)
Potassium: 3.7 mmol/L (ref 3.5–5.3)
Sodium: 140 mmol/L (ref 135–146)
TOTAL PROTEIN: 7.2 g/dL (ref 6.1–8.1)
Total Bilirubin: 0.7 mg/dL (ref 0.2–1.2)

## 2015-06-24 LAB — TSH: TSH: 0.768 u[IU]/mL (ref 0.350–4.500)

## 2015-06-24 NOTE — Telephone Encounter (Signed)
Yes thanks 

## 2015-06-24 NOTE — Telephone Encounter (Signed)
Rx request from [facility]. Pt is transferring because she needs delivery. Can pt have a new prescription for Alprazolam .25 mg- Take 1/2 to 1 tablet twice daily for sleep (anxiety). Pls advise.

## 2015-06-24 NOTE — Telephone Encounter (Signed)
Daughter notified of NEGATIVE LE venous doppler study.

## 2015-06-25 ENCOUNTER — Encounter: Payer: Self-pay | Admitting: Occupational Therapy

## 2015-06-25 MED ORDER — ALPRAZOLAM 0.25 MG PO TABS: ORAL_TABLET | ORAL | Status: DC

## 2015-06-27 ENCOUNTER — Emergency Department (HOSPITAL_COMMUNITY): Payer: Medicare Other

## 2015-06-27 ENCOUNTER — Encounter (HOSPITAL_COMMUNITY): Payer: Self-pay | Admitting: *Deleted

## 2015-06-27 ENCOUNTER — Inpatient Hospital Stay (HOSPITAL_COMMUNITY)
Admission: EM | Admit: 2015-06-27 | Discharge: 2015-07-02 | DRG: 668 | Disposition: A | Discharge status: Skilled Nursing Facility | Payer: Medicare Other | Attending: Internal Medicine | Admitting: Internal Medicine

## 2015-06-27 DIAGNOSIS — Z6841 Body Mass Index (BMI) 40.0 and over, adult: Secondary | ICD-10-CM

## 2015-06-27 DIAGNOSIS — J181 Lobar pneumonia, unspecified organism: Secondary | ICD-10-CM | POA: Diagnosis not present

## 2015-06-27 DIAGNOSIS — M6281 Muscle weakness (generalized): Secondary | ICD-10-CM | POA: Diagnosis not present

## 2015-06-27 DIAGNOSIS — M179 Osteoarthritis of knee, unspecified: Secondary | ICD-10-CM | POA: Diagnosis present

## 2015-06-27 DIAGNOSIS — I890 Lymphedema, not elsewhere classified: Secondary | ICD-10-CM | POA: Diagnosis present

## 2015-06-27 DIAGNOSIS — E039 Hypothyroidism, unspecified: Secondary | ICD-10-CM | POA: Diagnosis present

## 2015-06-27 DIAGNOSIS — G9340 Encephalopathy, unspecified: Secondary | ICD-10-CM | POA: Diagnosis not present

## 2015-06-27 DIAGNOSIS — Z885 Allergy status to narcotic agent status: Secondary | ICD-10-CM

## 2015-06-27 DIAGNOSIS — T404X5A Adverse effect of other synthetic narcotics, initial encounter: Secondary | ICD-10-CM | POA: Diagnosis not present

## 2015-06-27 DIAGNOSIS — N184 Chronic kidney disease, stage 4 (severe): Secondary | ICD-10-CM | POA: Diagnosis present

## 2015-06-27 DIAGNOSIS — E785 Hyperlipidemia, unspecified: Secondary | ICD-10-CM | POA: Diagnosis present

## 2015-06-27 DIAGNOSIS — J9621 Acute and chronic respiratory failure with hypoxia: Secondary | ICD-10-CM | POA: Diagnosis not present

## 2015-06-27 DIAGNOSIS — E662 Morbid (severe) obesity with alveolar hypoventilation: Secondary | ICD-10-CM | POA: Diagnosis not present

## 2015-06-27 DIAGNOSIS — Z825 Family history of asthma and other chronic lower respiratory diseases: Secondary | ICD-10-CM | POA: Diagnosis not present

## 2015-06-27 DIAGNOSIS — Z7982 Long term (current) use of aspirin: Secondary | ICD-10-CM | POA: Diagnosis not present

## 2015-06-27 DIAGNOSIS — N179 Acute kidney failure, unspecified: Secondary | ICD-10-CM | POA: Diagnosis not present

## 2015-06-27 DIAGNOSIS — F418 Other specified anxiety disorders: Secondary | ICD-10-CM | POA: Diagnosis not present

## 2015-06-27 DIAGNOSIS — E038 Other specified hypothyroidism: Secondary | ICD-10-CM

## 2015-06-27 DIAGNOSIS — R1031 Right lower quadrant pain: Secondary | ICD-10-CM | POA: Diagnosis present

## 2015-06-27 DIAGNOSIS — N132 Hydronephrosis with renal and ureteral calculous obstruction: Principal | ICD-10-CM | POA: Diagnosis present

## 2015-06-27 DIAGNOSIS — R110 Nausea: Secondary | ICD-10-CM | POA: Diagnosis not present

## 2015-06-27 DIAGNOSIS — R0603 Acute respiratory distress: Secondary | ICD-10-CM

## 2015-06-27 DIAGNOSIS — Y92230 Patient room in hospital as the place of occurrence of the external cause: Secondary | ICD-10-CM | POA: Diagnosis not present

## 2015-06-27 DIAGNOSIS — K2970 Gastritis, unspecified, without bleeding: Secondary | ICD-10-CM | POA: Diagnosis not present

## 2015-06-27 DIAGNOSIS — Z881 Allergy status to other antibiotic agents status: Secondary | ICD-10-CM

## 2015-06-27 DIAGNOSIS — I129 Hypertensive chronic kidney disease with stage 1 through stage 4 chronic kidney disease, or unspecified chronic kidney disease: Secondary | ICD-10-CM | POA: Diagnosis present

## 2015-06-27 DIAGNOSIS — N390 Urinary tract infection, site not specified: Secondary | ICD-10-CM | POA: Diagnosis present

## 2015-06-27 DIAGNOSIS — N200 Calculus of kidney: Secondary | ICD-10-CM | POA: Diagnosis not present

## 2015-06-27 DIAGNOSIS — M25551 Pain in right hip: Secondary | ICD-10-CM | POA: Diagnosis not present

## 2015-06-27 DIAGNOSIS — Z853 Personal history of malignant neoplasm of breast: Secondary | ICD-10-CM | POA: Diagnosis not present

## 2015-06-27 DIAGNOSIS — R2689 Other abnormalities of gait and mobility: Secondary | ICD-10-CM | POA: Diagnosis not present

## 2015-06-27 DIAGNOSIS — N201 Calculus of ureter: Secondary | ICD-10-CM | POA: Diagnosis present

## 2015-06-27 DIAGNOSIS — R112 Nausea with vomiting, unspecified: Secondary | ICD-10-CM | POA: Diagnosis not present

## 2015-06-27 DIAGNOSIS — R29898 Other symptoms and signs involving the musculoskeletal system: Secondary | ICD-10-CM | POA: Diagnosis not present

## 2015-06-27 DIAGNOSIS — N189 Chronic kidney disease, unspecified: Secondary | ICD-10-CM | POA: Diagnosis not present

## 2015-06-27 DIAGNOSIS — K219 Gastro-esophageal reflux disease without esophagitis: Secondary | ICD-10-CM | POA: Diagnosis present

## 2015-06-27 DIAGNOSIS — Z79899 Other long term (current) drug therapy: Secondary | ICD-10-CM

## 2015-06-27 DIAGNOSIS — Z888 Allergy status to other drugs, medicaments and biological substances status: Secondary | ICD-10-CM | POA: Diagnosis not present

## 2015-06-27 DIAGNOSIS — R4189 Other symptoms and signs involving cognitive functions and awareness: Secondary | ICD-10-CM | POA: Diagnosis not present

## 2015-06-27 DIAGNOSIS — Z9981 Dependence on supplemental oxygen: Secondary | ICD-10-CM

## 2015-06-27 DIAGNOSIS — I10 Essential (primary) hypertension: Secondary | ICD-10-CM | POA: Diagnosis not present

## 2015-06-27 DIAGNOSIS — R2681 Unsteadiness on feet: Secondary | ICD-10-CM | POA: Diagnosis not present

## 2015-06-27 DIAGNOSIS — R0602 Shortness of breath: Secondary | ICD-10-CM | POA: Diagnosis not present

## 2015-06-27 LAB — URINALYSIS, ROUTINE W REFLEX MICROSCOPIC
BILIRUBIN URINE: NEGATIVE
Glucose, UA: NEGATIVE mg/dL
Hgb urine dipstick: NEGATIVE
KETONES UR: NEGATIVE mg/dL
LEUKOCYTES UA: NEGATIVE
NITRITE: NEGATIVE
PROTEIN: NEGATIVE mg/dL
Specific Gravity, Urine: 1.009 (ref 1.005–1.030)
pH: 6 (ref 5.0–8.0)

## 2015-06-27 LAB — CBC
HCT: 38.8 % (ref 36.0–46.0)
Hemoglobin: 12.5 g/dL (ref 12.0–15.0)
MCH: 28.5 pg (ref 26.0–34.0)
MCHC: 32.2 g/dL (ref 30.0–36.0)
MCV: 88.6 fL (ref 78.0–100.0)
PLATELETS: 150 10*3/uL (ref 150–400)
RBC: 4.38 MIL/uL (ref 3.87–5.11)
RDW: 14.9 % (ref 11.5–15.5)
WBC: 6.9 10*3/uL (ref 4.0–10.5)

## 2015-06-27 LAB — COMPREHENSIVE METABOLIC PANEL
ALK PHOS: 79 U/L (ref 38–126)
ALT: 17 U/L (ref 14–54)
AST: 18 U/L (ref 15–41)
Albumin: 3.1 g/dL — ABNORMAL LOW (ref 3.5–5.0)
Anion gap: 11 (ref 5–15)
BILIRUBIN TOTAL: 0.5 mg/dL (ref 0.3–1.2)
BUN: 23 mg/dL — AB (ref 6–20)
CALCIUM: 8.5 mg/dL — AB (ref 8.9–10.3)
CO2: 27 mmol/L (ref 22–32)
CREATININE: 1.59 mg/dL — AB (ref 0.44–1.00)
Chloride: 105 mmol/L (ref 101–111)
GFR calc Af Amer: 34 mL/min — ABNORMAL LOW (ref >60)
GFR, EST NON AFRICAN AMERICAN: 29 mL/min — AB (ref >60)
Glucose, Bld: 104 mg/dL — ABNORMAL HIGH (ref 65–99)
POTASSIUM: 4.1 mmol/L (ref 3.5–5.1)
Sodium: 143 mmol/L (ref 135–145)
TOTAL PROTEIN: 6.4 g/dL — AB (ref 6.5–8.1)

## 2015-06-27 LAB — LIPASE, BLOOD: Lipase: 19 U/L (ref 11–51)

## 2015-06-27 MED ORDER — SODIUM CHLORIDE 0.9 % IJ SOLN
3.0000 mL | Freq: Two times a day (BID) | INTRAMUSCULAR | Status: DC
  Administered 2015-06-27 – 2015-07-01 (×5): 3 mL via INTRAVENOUS

## 2015-06-27 MED ORDER — LEVOTHYROXINE SODIUM 200 MCG PO TABS
200.0000 ug | ORAL_TABLET | Freq: Every day | ORAL | Status: DC
Start: 2015-06-28 — End: 2015-07-02
  Administered 2015-06-29 – 2015-07-02 (×4): 200 ug via ORAL
  Filled 2015-06-27 (×5): qty 1

## 2015-06-27 MED ORDER — IOHEXOL 300 MG/ML  SOLN: 100.0000 mL | Freq: Once | INTRAMUSCULAR | Status: DC | PRN

## 2015-06-27 MED ORDER — LORATADINE 10 MG PO TABS
10.0000 mg | ORAL_TABLET | Freq: Every day | ORAL | Status: DC | PRN
  Administered 2015-06-27 – 2015-07-01 (×2): 10 mg via ORAL
  Filled 2015-06-27 (×3): qty 1

## 2015-06-27 MED ORDER — LORATADINE 10 MG PO TABS: 10.0000 mg | ORAL_TABLET | Freq: Every day | ORAL | Status: DC

## 2015-06-27 MED ORDER — SODIUM CHLORIDE 0.9 % IV BOLUS (SEPSIS)
500.0000 mL | Freq: Once | INTRAVENOUS | Status: AC
  Administered 2015-06-27: 500 mL via INTRAVENOUS

## 2015-06-27 MED ORDER — ACETAMINOPHEN 650 MG RE SUPP: 650.0000 mg | Freq: Four times a day (QID) | RECTAL | Status: DC | PRN

## 2015-06-27 MED ORDER — NYSTATIN 100000 UNIT/GM EX POWD
Freq: Two times a day (BID) | CUTANEOUS | Status: DC
  Administered 2015-06-27 – 2015-07-01 (×8): via TOPICAL
  Administered 2015-07-02: 1 via TOPICAL
  Filled 2015-06-27: qty 15

## 2015-06-27 MED ORDER — ONDANSETRON HCL 4 MG/2ML IJ SOLN
4.0000 mg | Freq: Once | INTRAMUSCULAR | Status: AC
  Administered 2015-06-27: 4 mg via INTRAVENOUS
  Filled 2015-06-27: qty 2

## 2015-06-27 MED ORDER — PANTOPRAZOLE SODIUM 40 MG PO TBEC
40.0000 mg | DELAYED_RELEASE_TABLET | Freq: Every day | ORAL | Status: DC
  Administered 2015-06-27 – 2015-07-02 (×5): 40 mg via ORAL
  Filled 2015-06-27 (×7): qty 1

## 2015-06-27 MED ORDER — IOHEXOL 300 MG/ML  SOLN: 25.0000 mL | Freq: Once | INTRAMUSCULAR | Status: DC | PRN

## 2015-06-27 MED ORDER — AMLODIPINE BESYLATE-VALSARTAN 5-160 MG PO TABS: 1.0000 | ORAL_TABLET | Freq: Every day | ORAL | Status: DC

## 2015-06-27 MED ORDER — MORPHINE SULFATE (PF) 2 MG/ML IV SOLN
2.0000 mg | INTRAVENOUS | Status: DC | PRN
  Administered 2015-06-27 – 2015-06-29 (×10): 2 mg via INTRAVENOUS
  Filled 2015-06-27 (×11): qty 1

## 2015-06-27 MED ORDER — OMEGA-3-ACID ETHYL ESTERS 1 G PO CAPS
1.0000 g | ORAL_CAPSULE | Freq: Every day | ORAL | Status: DC
  Administered 2015-06-27 – 2015-07-02 (×5): 1 g via ORAL
  Filled 2015-06-27 (×6): qty 1

## 2015-06-27 MED ORDER — TAMSULOSIN HCL 0.4 MG PO CAPS
0.4000 mg | ORAL_CAPSULE | Freq: Once | ORAL | Status: AC
  Administered 2015-06-27: 0.4 mg via ORAL
  Filled 2015-06-27: qty 1

## 2015-06-27 MED ORDER — AMLODIPINE BESYLATE 5 MG PO TABS
5.0000 mg | ORAL_TABLET | Freq: Every day | ORAL | Status: DC
  Administered 2015-06-27 – 2015-07-02 (×5): 5 mg via ORAL
  Filled 2015-06-27 (×6): qty 1

## 2015-06-27 MED ORDER — CLOTRIMAZOLE 1 % EX CREA: 1.0000 "application " | TOPICAL_CREAM | Freq: Two times a day (BID) | CUTANEOUS | Status: DC

## 2015-06-27 MED ORDER — SODIUM CHLORIDE 0.9 % IV SOLN
INTRAVENOUS | Status: AC
  Administered 2015-06-27 – 2015-06-30 (×5): via INTRAVENOUS

## 2015-06-27 MED ORDER — ONDANSETRON HCL 4 MG PO TABS: 4.0000 mg | ORAL_TABLET | Freq: Four times a day (QID) | ORAL | Status: DC | PRN

## 2015-06-27 MED ORDER — ONDANSETRON HCL 4 MG/2ML IJ SOLN
4.0000 mg | Freq: Four times a day (QID) | INTRAMUSCULAR | Status: DC | PRN
  Administered 2015-06-28 – 2015-06-30 (×2): 4 mg via INTRAVENOUS
  Filled 2015-06-27 (×2): qty 2

## 2015-06-27 MED ORDER — ACETAMINOPHEN 325 MG PO TABS
650.0000 mg | ORAL_TABLET | Freq: Four times a day (QID) | ORAL | Status: DC | PRN
  Administered 2015-07-01: 650 mg via ORAL
  Filled 2015-06-27: qty 2

## 2015-06-27 MED ORDER — MORPHINE SULFATE (PF) 4 MG/ML IV SOLN
4.0000 mg | Freq: Once | INTRAVENOUS | Status: AC
  Administered 2015-06-27: 4 mg via INTRAVENOUS
  Filled 2015-06-27: qty 1

## 2015-06-27 MED ORDER — FLUTICASONE PROPIONATE 50 MCG/ACT NA SUSP
2.0000 | Freq: Every day | NASAL | Status: DC | PRN
Start: 2015-06-27 — End: 2015-07-02
  Administered 2015-06-29 – 2015-07-02 (×4): 2 via NASAL
  Filled 2015-06-27: qty 16

## 2015-06-27 MED ORDER — IRBESARTAN 150 MG PO TABS
150.0000 mg | ORAL_TABLET | Freq: Every day | ORAL | Status: DC
  Administered 2015-06-27 – 2015-07-02 (×5): 150 mg via ORAL
  Filled 2015-06-27 (×6): qty 1

## 2015-06-27 MED ORDER — ASPIRIN EC 81 MG PO TBEC
81.0000 mg | DELAYED_RELEASE_TABLET | Freq: Every day | ORAL | Status: DC
  Administered 2015-06-27 – 2015-07-02 (×5): 81 mg via ORAL
  Filled 2015-06-27 (×6): qty 1

## 2015-06-27 MED ORDER — MORPHINE SULFATE (PF) 2 MG/ML IV SOLN
2.0000 mg | Freq: Once | INTRAVENOUS | Status: AC
  Administered 2015-06-27: 2 mg via INTRAVENOUS
  Filled 2015-06-27: qty 1

## 2015-06-27 MED ORDER — ALPRAZOLAM 0.25 MG PO TABS
0.1250 mg | ORAL_TABLET | Freq: Two times a day (BID) | ORAL | Status: DC | PRN
  Administered 2015-06-30: 0.125 mg via ORAL
  Filled 2015-06-27 (×2): qty 1

## 2015-06-27 NOTE — ED Provider Notes (Signed)
CSN: [id number]     Arrival date & time 06/27/15  1129 History   First MD Initiated Contact with Patient 06/27/15 1233     Chief Complaint  Patient presents with  . Abdominal Pain     (Consider location/radiation/quality/duration/timing/severity/associated sxs/prior Treatment) Patient is a 82 y.o. female presenting with abdominal pain.  Abdominal Pain   MCKAYLIN BASTIEN is a 82 y.o. female with PMH significant for morbid obesity, migraine, GERD, HLD, HTN, DJD, breast cancer s/p right lumpectomy and XRT, and chloecystectomy who presents with 1 day history of gradual onset, progressively worsening, now constant abdominal pain.  Patient reports she experienced sharp RLQ abdominal pain yesterday that was relieved with 400 mg ibuprofen.  She then experienced sharp RUQ abdominal pain this morning after breakfast that has remained persistent despite ibuprofen.  Associated symptoms include nausea, cough, and SOB.  Denies vomiting, diarrhea, fever, CP, urinary symptoms, melena, or hematochezia. She reports regular BMs with the last being this morning.    Past Medical History  Diagnosis Date  . Morbid obesity ([place])   . MIGRAINE HEADACHE   . ARTHRITIS, KNEES, BILATERAL   . DEPRESSION   . GERD   . HYPERLIPIDEMIA   . HYPERTENSION   . DJD (degenerative joint disease) of knee   . Urge incontinence   . HYPOTHYROIDISM     postsurgical  . OSA (obstructive sleep apnea) 01/17/2011 dx  . Hypoxia   . Oxygen dependent   . Breast CA ([place]) 2000    s/p R lumpectomy and XRT  . UTI (lower urinary tract infection) 12/2014   Past Surgical History  Procedure Laterality Date  . Cholecystectomy  1995  . Breast lumpectomy  2000  . Thyroidectomy    . Breast biopsy  2000  . Replacement total knee bilateral  1999, 2005  . Tubal ligation    . Parathyroidectomy      2-3 removed  . Tonsillectomy    . Adenoidectomy    . Right leg femur repaired    . Abdominal hysterectomy    . Cataract extraction      Family History  Problem Relation Age of Onset  . Emphysema Sister   . Coronary artery disease Neg Hx    Social History  Substance Use Topics  . Smoking status: Former Smoker -- 0.25 packs/day for 2 years    Types: Cigarettes    Quit date: 06/19/1974  . Smokeless tobacco: Never Used     Comment: Quit in late 20's  . Alcohol Use: 0.0 oz/week    0 Standard drinks or equivalent per week     Comment: rare   OB History    Gravida Para Term Preterm AB TAB SAB Ectopic Multiple Living   5 5             Review of Systems  Gastrointestinal: Positive for abdominal pain.  All other systems negative unless otherwise stated in HPI     Allergies  Azithromycin; Beta adrenergic blockers; Ciprofloxacin; Codeine; Epinephrine; Klonopin; Oxycodone; Paxil; Prednisone; Propoxyphene hcl; Tramadol; Meloxicam; and Vancomycin  Home Medications   Prior to Admission medications   Medication Sig Start Date End Date Taking? Authorizing Provider  ALPRAZolam (XANAX) 0.25 MG tablet TAKE 1/2 TO 1 TABLET BY MOUTH TWICE A DAY AS NEEDED FOR ANXIETY OR SLEEP Patient taking differently: Take 0.125-0.25 mg by mouth 2 (two) times daily as needed for anxiety or sleep. TAKE 1/2 TO 1 TABLET BY MOUTH TWICE A DAY AS NEEDED FOR ANXIETY OR SLEEP  06/25/15  Yes Marin Olp, MD  amLODipine-valsartan (EXFORGE) 5-160 MG tablet Take 1 tablet by mouth daily. 05/10/15  Yes Marin Olp, MD  aspirin 81 MG tablet Take 81 mg by mouth daily.    Yes Historical Provider, MD  cetirizine (ZYRTEC) 10 MG tablet Take 10 mg by mouth daily as needed for allergies.    Yes Historical Provider, MD  clotrimazole (LOTRIMIN) 1 % cream Apply 1 application topically 2 (two) times daily. As needed for fungal skin infections 04/02/15  Yes Marin Olp, MD  FIBER PO Take 2 tablets by mouth every morning.    Yes Historical Provider, MD  fluticasone (FLONASE) 50 MCG/ACT nasal spray Place 2 sprays into both nostrils daily. Patient taking  differently: Place 2 sprays into both nostrils daily as needed for allergies or rhinitis.  01/11/15  Yes Chesley Mires, MD  hydrochlorothiazide (HYDRODIURIL) 12.5 MG tablet Take 1 tablet (12.5 mg total) by mouth daily. 05/10/15  Yes Marin Olp, MD  isometheptene-acetaminophen-dichloralphenazone (MIDRIN) [phone] MG capsule Take 1 capsule by mouth daily as needed for migraine. 04/02/15  Yes Marin Olp, MD  loratadine (CLARITIN) 10 MG tablet Take 10 mg by mouth daily as needed for allergies.    Yes Historical Provider, MD  nystatin (MYCOSTATIN/NYSTOP) 100000 UNIT/GM POWD APPLY TO AFFECTED AREA TWICE A DAY 11/25/14  Yes Marin Olp, MD  Omega-3 Fatty Acids (FISH OIL) 1200 MG CAPS Take 2,400 mg by mouth daily.    Yes Historical Provider, MD  omeprazole (PRILOSEC) 20 MG capsule TAKE 1 CAPSULE (20 MG TOTAL) BY MOUTH 2 (TWO) TIMES DAILY. 11/06/14  Yes Marin Olp, MD  potassium chloride (MICRO-K) 10 MEQ CR capsule Take 4 capsules (40 mEq total) by mouth daily. Patient taking differently: Take 20 mEq by mouth 2 (two) times daily.  05/10/15  Yes Minus Breeding, MD  SYNTHROID 200 MCG tablet Take 1 tablet (200 mcg total) by mouth daily before breakfast. 04/02/15  Yes Marin Olp, MD  torsemide (DEMADEX) 20 MG tablet Take 1 tablet (20 mg total) by mouth daily. 04/30/15  Yes Minus Breeding, MD  fluconazole (DIFLUCAN) 150 MG tablet Take 1 tablet (150 mg total) by mouth once. Take again 3 days later Patient not taking: Reported on 06/27/2015 04/02/15   Marin Olp, MD  furosemide (LASIX) 40 MG tablet TAKE 1 TABLET BY MOUTH EVERY DAY Patient not taking: Reported on 06/27/2015 05/04/15   Marin Olp, MD  traMADol (ULTRAM) 50 MG tablet Take 1 tablet (50 mg total) by mouth every 8 (eight) hours as needed. Patient not taking: Reported on 06/27/2015 06/23/15   Chelle Jeffery, PA-C   BP 138/74 mmHg  Pulse 91  Temp(Src) 97.5 F (36.4 C) (Oral)  Resp 21  SpO2 99% Physical Exam    Constitutional: She is oriented to person, place, and time. She appears well-developed and well-nourished.  Morbidly obese 82 year old female who appears well.   HENT:  Head: Normocephalic and atraumatic.  Mouth/Throat: Oropharynx is clear and moist.  Eyes: Conjunctivae are normal. Pupils are equal, round, and reactive to light.  Neck: Normal range of motion. Neck supple.  Cardiovascular: Normal rate, regular rhythm and normal heart sounds.   No murmur heard. Pulmonary/Chest: Effort normal and breath sounds normal. No accessory muscle usage or stridor. No respiratory distress. She has no wheezes. She has no rhonchi. She has no rales.  Abdominal: Soft. Bowel sounds are normal. She exhibits no distension. There is tenderness. There is  no rigidity, no rebound, no guarding and no CVA tenderness.    Musculoskeletal: Normal range of motion.  Lymphadenopathy:    She has no cervical adenopathy.  Neurological: She is alert and oriented to person, place, and time.  Speech clear without dysarthria.  Skin: Skin is warm and dry.  Psychiatric: She has a normal mood and affect. Her behavior is normal.    ED Course  Procedures (including critical care time) Labs Review Labs Reviewed  COMPREHENSIVE METABOLIC PANEL - Abnormal; Notable for the following:    Glucose, Bld 104 (*)    BUN 23 (*)    Creatinine, Ser 1.59 (*)    Calcium 8.5 (*)    Total Protein 6.4 (*)    Albumin 3.1 (*)    GFR calc non Af Amer 29 (*)    GFR calc Af Amer 34 (*)    All other components within normal limits  LIPASE, BLOOD  CBC  URINALYSIS, ROUTINE W REFLEX MICROSCOPIC (NOT AT [facility])    Imaging Review Ct Renal Stone Study  06/27/2015  CLINICAL DATA:  Abdominal pain EXAM: CT ABDOMEN AND PELVIS WITHOUT CONTRAST TECHNIQUE: Multidetector CT imaging of the abdomen and pelvis was performed following the standard protocol without IV contrast. COMPARISON:  08/26/2013 FINDINGS: Lower chest: The lung bases are clear. No pleural  or pericardial effusion. Hepatobiliary: No suspicious liver abnormality. Previous cholecystectomy. No biliary dilatation. Pancreas: Unremarkable appearance of the pancreas. Spleen: Again noted is a low-attenuation structure within the spleen measuring 2.9 cm. This likely represents a benign abnormality. Adrenals/Urinary Tract: Normal adrenal glands. Bilateral renal sinus cysts are identified. There is right-sided pelvocaliectasis and hydroureter. Within the mid right ureter there is a stone measuring 5 mm, image 49 of series 2. Urinary bladder appears normal. No left-sided obstructive uropathy. No left renal calculi. Stomach/Bowel: The stomach is within normal limits. The small bowel loops have a normal course and caliber. No obstruction. Multiple distal colonic diverticula noted. No acute inflammation. Vascular/Lymphatic: Calcified atherosclerotic disease involves the abdominal aorta. No aneurysm. No enlarged retroperitoneal or mesenteric adenopathy. No enlarged pelvic or inguinal lymph nodes. Reproductive: Previous hysterectomy. No adnexal mass. Cyst and left ovary measures 3 x 1.7 cm Other: No free fluid or fluid collections within the abdomen or pelvis. Musculoskeletal: There is a scoliosis deformity involving the thoracic and lumbar spine. Associated multi level degenerative disc disease is identified. IMPRESSION: 1. Right mid ureteral calculus is identified measuring 5 mm. 2. Bilateral renal sinus cysts. 3. Aortic atherosclerosis 4. Scoliosis and degenerative disc disease. Electronically Signed   By: Kerby Moors M.D.   On: 06/27/2015 14:15   I have personally reviewed and evaluated these images and lab results as part of my medical decision-making.   EKG Interpretation None      MDM   Final diagnoses:  Ureterolithiasis  AKI (acute kidney injury) ([place])    Patient presents with 1 day history of right sided abdominal pain she describes as "a kidney stone".  Assoc sxs include nausea, SOB, and  cough.  No fever, vomiting, diarrhea, CP, or SOB.  BMs have been regular.  VSS, NAD.  On exam, heart RRR, lungs CTAB, abdomen soft with right sided abdominal tenderness.    UA negative CMP remarkable for Cr 1.59 and BUN 23.  Creatinine 4d ago 0.79.  Evidence of AKI. CBC unremarkable. Renal CT remarkable for right mid ureteral calculus measuring 5 mm.  Will consult urology given obstructing stone and evidence of AKI.  Per Urology, Dr. Ottis Stain, no  indication for surgical intervention at this time.  Continue fluids and flomax, strain all urine.    Will admit for AKI.  Case has been discussed with and seen by Dr. Audie Pinto who agrees with the above plan for admission.      Gloriann Loan, PA-C 06/27/15 1731  Leonard Schwartz, MD 07/07/15 [phone]

## 2015-06-27 NOTE — ED Notes (Signed)
Bed: WA21 Expected date: 06/27/15 Expected time: 11:33 AM Means of arrival:  Comments: EMS 82 yo F

## 2015-06-27 NOTE — ED Notes (Signed)
Report given to Hartley, RN

## 2015-06-27 NOTE — H&P (Signed)
[facility] History and Physical  [name] OZH:[id number] DOB: 03-Aug-1930 DOA: 06/27/2015  Referring physician: ED PA: Gloriann Loan  PCP: Garret Reddish, MD   Chief Complaint: abdominal pain   HPI:  82 y.o. female with past medical history of CKD (baseline Cr 1.15), hypertension, presented to ED with main concern of one day duration of right flank pain, intermittent and sharp, 10/10 in severity when present, non radiating, worse with eating and with no specific alleviating factors, associated with nausea and poor oral intake.  Upon arrival to the ED she have received $RemoveBefor'4mg'nSUFJTCDtzLs$  of IV narcotics which helped with pain. CT imaging was obtained in the ED that demonstrated a 5 mm partially obstructing stone in the mid RIGHT ureter (as well as bilateral parapelvic cysts which are stable from prior imaging). Blood work was notable for Cr of 1.58 (baseline 1.15). Urology will see the pt in consultation.   Assessment and Plan:  Principal Problem:   Abdominal pain, RLQ (right lower quadrant) /  Right ureteral calculus - CT renal stone study demonstrated right mid ureteral calculus, 5 mm - Urology consulted, appreciate their input - Continue supportive caer with IV fluids, analgesia as needed  - UA unremarkable   Active Problems:   Hypothyroidism - Resume home dose levothyroxine    Morbid obesity due to excess calories  ([place]) - Counseled on diet     Essential hypertension - Hold hctz, lasix and torsemide due to acute on chronic renal failure - Use amlodipine valsartan per home dose     Obesity hypoventilation syndrome (HCC) - Stable respiratory status    Acute renal failure superimposed on stage 4 chronic kidney disease (HCC) - Baseline Cr around  1.15 about 7 months ago - On this admission, Cr 1.5 and likely due to all diuretics pt is taking  - All diuretics placed on hold  - Check BMP tomorrow am     Anxiety and depression - Use xanax per home dose - Stable, pt not  depressed or anxious    Dyslipidemia - Continue omega 3 supplementation     History of breast cancer - Stable    DVT prophylaxis: - SCD's bilaterally   Radiological Exams on Admission: Ct Renal Stone Study 06/27/2015  C 1. Right mid ureteral calculus is identified measuring 5 mm. 2. Bilateral renal sinus cysts. 3. Aortic atherosclerosis 4. Scoliosis and degenerative disc disease. Electronically Signed   By: Kerby Moors M.D.   On: 06/27/2015 14:15     Code Status: Full Family Communication: Pt and family at bedside Disposition Plan: Admit for further evaluation    Mart Piggs [facility] [phone]   Review of Systems:  Constitutional: Negative for fever, chills and malaise/fatigue. Negative for diaphoresis.  HENT: Negative for hearing loss, ear pain, nosebleeds, congestion, sore throat, neck pain, tinnitus and ear discharge.   Eyes: Negative for blurred vision, double vision, photophobia, pain, discharge and redness.  Respiratory: Negative for cough, hemoptysis, sputum production, shortness of breath, wheezing and stridor.   Cardiovascular: Negative for chest pain, palpitations, orthopnea, claudication and leg swelling.  Gastrointestinal: per HPI  Genitourinary: Negative for dysuria, urgency, frequency, hematuria and flank pain.  Musculoskeletal: Negative for myalgias, back pain, joint pain and falls.  Skin: Negative for itching and rash.  Neurological: Negative for dizziness and weakness. Negative for tingling, tremors, sensory change, speech change, focal weakness, loss of consciousness and headaches.  Endo/Heme/Allergies: Negative for environmental allergies and polydipsia. Does not bruise/bleed easily.  Psychiatric/Behavioral: Negative for suicidal ideas. The  patient is not nervous/anxious.      Past Medical History  Diagnosis Date  . Morbid obesity ([place])   . MIGRAINE HEADACHE   . ARTHRITIS, KNEES, BILATERAL   . DEPRESSION   . GERD   . HYPERLIPIDEMIA   . HYPERTENSION   .  DJD (degenerative joint disease) of knee   . Urge incontinence   . HYPOTHYROIDISM     postsurgical  . OSA (obstructive sleep apnea) 01/17/2011 dx  . Hypoxia   . Oxygen dependent   . Breast CA ([place]) 2000    s/p R lumpectomy and XRT  . UTI (lower urinary tract infection) 12/2014    Past Surgical History  Procedure Laterality Date  . Cholecystectomy  1995  . Breast lumpectomy  2000  . Thyroidectomy    . Breast biopsy  2000  . Replacement total knee bilateral  1999, 2005  . Tubal ligation    . Parathyroidectomy      2-3 removed  . Tonsillectomy    . Adenoidectomy    . Right leg femur repaired    . Abdominal hysterectomy    . Cataract extraction      Social History:  reports that she quit smoking about 41 years ago. Her smoking use included Cigarettes. She has a .5 pack-year smoking history. She has never used smokeless tobacco. She reports that she drinks alcohol. She reports that she does not use illicit drugs.  Allergies  Allergen Reactions  . Azithromycin Itching  . Beta Adrenergic Blockers     Depression   . Ciprofloxacin Other (See Comments)    REACTION: edgy and very jumpy   . Codeine Nausea And Vomiting  . Epinephrine Other (See Comments)    REACTION: rapid pulse, sweats  . Klonopin [Clonazepam] Other (See Comments)    Makes her feel very suicidal   . Oxycodone Other (See Comments)    Patient felt like it altered her mental status "Crazy" . Hallucinations later as well.  [name] Paxil [Paroxetine Hydrochloride] Other (See Comments)    Sever depression   . Prednisone     "makes me feel really bad"  . Propoxyphene Hcl Nausea And Vomiting  . Tramadol Other (See Comments)    Feels "jittery"  . Meloxicam Diarrhea  . Vancomycin Rash    Localized rash related to infusion rate.    Family History  Problem Relation Age of Onset  . Emphysema Sister   . Coronary artery disease Neg Hx     Prior to Admission medications   Medication Sig Start Date End Date Taking?  Authorizing Provider  ALPRAZolam (XANAX) 0.25 MG tablet TAKE 1/2 TO 1 TABLET BY MOUTH TWICE A DAY AS NEEDED FOR ANXIETY OR SLEEP Patient taking differently: Take 0.125-0.25 mg by mouth 2 (two) times daily as needed for anxiety or sleep. TAKE 1/2 TO 1 TABLET BY MOUTH TWICE A DAY AS NEEDED FOR ANXIETY OR SLEEP 06/25/15  Yes Marin Olp, MD  amLODipine-valsartan (EXFORGE) 5-160 MG tablet Take 1 tablet by mouth daily. 05/10/15  Yes Marin Olp, MD  aspirin 81 MG tablet Take 81 mg by mouth daily.    Yes Historical Provider, MD  cetirizine (ZYRTEC) 10 MG tablet Take 10 mg by mouth daily as needed for allergies.    Yes Historical Provider, MD  clotrimazole (LOTRIMIN) 1 % cream Apply 1 application topically 2 (two) times daily. As needed for fungal skin infections 04/02/15  Yes Marin Olp, MD  FIBER PO Take 2 tablets by mouth  every morning.    Yes Historical Provider, MD  fluticasone (FLONASE) 50 MCG/ACT nasal spray Place 2 sprays into both nostrils daily. Patient taking differently: Place 2 sprays into both nostrils daily as needed for allergies or rhinitis.  01/11/15  Yes Chesley Mires, MD  hydrochlorothiazide (HYDRODIURIL) 12.5 MG tablet Take 1 tablet (12.5 mg total) by mouth daily. 05/10/15  Yes Marin Olp, MD  isometheptene-acetaminophen-dichloralphenazone (MIDRIN) [phone] MG capsule Take 1 capsule by mouth daily as needed for migraine. 04/02/15  Yes Marin Olp, MD  loratadine (CLARITIN) 10 MG tablet Take 10 mg by mouth daily as needed for allergies.    Yes Historical Provider, MD  nystatin (MYCOSTATIN/NYSTOP) 100000 UNIT/GM POWD APPLY TO AFFECTED AREA TWICE A DAY 11/25/14  Yes Marin Olp, MD  Omega-3 Fatty Acids (FISH OIL) 1200 MG CAPS Take 2,400 mg by mouth daily.    Yes Historical Provider, MD  omeprazole (PRILOSEC) 20 MG capsule TAKE 1 CAPSULE (20 MG TOTAL) BY MOUTH 2 (TWO) TIMES DAILY. 11/06/14  Yes Marin Olp, MD  potassium chloride (MICRO-K) 10 MEQ CR capsule  Take 4 capsules (40 mEq total) by mouth daily. Patient taking differently: Take 20 mEq by mouth 2 (two) times daily.  05/10/15  Yes Minus Breeding, MD  SYNTHROID 200 MCG tablet Take 1 tablet (200 mcg total) by mouth daily before breakfast. 04/02/15  Yes Marin Olp, MD  torsemide (DEMADEX) 20 MG tablet Take 1 tablet (20 mg total) by mouth daily. 04/30/15  Yes Minus Breeding, MD  fluconazole (DIFLUCAN) 150 MG tablet Take 1 tablet (150 mg total) by mouth once. Take again 3 days later Patient not taking: Reported on 06/27/2015 04/02/15   Marin Olp, MD  furosemide (LASIX) 40 MG tablet TAKE 1 TABLET BY MOUTH EVERY DAY Patient not taking: Reported on 06/27/2015 05/04/15   Marin Olp, MD  traMADol (ULTRAM) 50 MG tablet Take 1 tablet (50 mg total) by mouth every 8 (eight) hours as needed. Patient not taking: Reported on 06/27/2015 06/23/15   Harrison Mons, PA-C    Physical Exam: Filed Vitals:   06/27/15 1700 06/27/15 1723 06/27/15 1730 06/27/15 1807  BP: 137/73 137/73 120/103 118/59  Pulse: 82 84 87 87  Temp:    97.8 F (36.6 C)  TempSrc:    Oral  Resp: $Remo'18 14 20 16  'WPAEu$ SpO2: 98% 92% 90% 94%    Physical Exam  Constitutional: morbidly obese. No distress.  HENT: Normocephalic. External right and left ear normal. Oropharynx is clear and moist.  Eyes: Conjunctivae are normal. No scleral icterus.  Neck: Normal ROM. Neck supple. No JVD. No tracheal deviation. No thyromegaly.  CVS: RRR, S1/S2 +, no murmurs, no gallops, no carotid bruit.  Pulmonary: Effort and breath sounds normal, no stridor, rhonchi, wheezes, rales.  Abdominal: Soft. BS +,  no distension, tenderness in right lower quadrant but no rebound tenderness or guarding  Musculoskeletal: Normal range of motion. No edema and no tenderness.  Lymphadenopathy: No lymphadenopathy noted, cervical, inguinal. Neuro: Alert. Normal reflexes, muscle tone coordination. No cranial nerve deficit. Skin: Skin is warm and dry. No rash noted. Not  diaphoretic. No erythema. No pallor.  Psychiatric: Normal mood and affect. Behavior, judgment, thought content normal.   Labs on Admission:  Basic Metabolic Panel:  Recent Labs Lab 06/23/15 1914 06/27/15 1215  NA 140 143  K 3.7 4.1  CL 101 105  CO2 28 27  GLUCOSE 94 104*  BUN 11 23*  CREATININE 0.79 1.59*  CALCIUM 8.9 8.5*   Liver Function Tests:  Recent Labs Lab 06/23/15 1914 06/27/15 1215  AST 21 18  ALT 17 17  ALKPHOS 85 79  BILITOT 0.7 0.5  PROT 7.2 6.4*  ALBUMIN 4.1 3.1*    Recent Labs Lab 06/27/15 1215  LIPASE 19   No results for input(s): AMMONIA in the last 168 hours. CBC:  Recent Labs Lab 06/23/15 1918 06/27/15 1215  WBC 7.3 6.9  HGB 14.3 12.5  HCT 43.6 38.8  MCV 86.1 88.6  PLT  --  150    If 7PM-7AM, please contact night-coverage [email or web address] Password [email or web address] 06/27/2015, 6:20 PM

## 2015-06-27 NOTE — ED Notes (Signed)
Pt transported to CT ?

## 2015-06-27 NOTE — ED Notes (Signed)
EMS reports abd pain since yesterday morning, low rt quad, Ibuprofen made it better, returned after ate breakfast, except upper rt quad, paid with palpation. No vomiting or diarrhea, some nausea. VS 139/90  92  18  98% CBG 131.

## 2015-06-27 NOTE — Consult Note (Signed)
Urology Consult   Requesting Provider:  Audie Pinto, MD  Reason for Consult: RIGHT Nephrolithiasis and AKI  HPI:  82yF with history as outlined below who we are seeing in consultation at the request of Dr. Audie Pinto for management of acute nephrolithiasis nephrolithiasis. Patient has no prior history of nephrolithiasis. She presents with 24 hours of worsening RIGHT flank pain which is intermittent in nature and localized to the right flank (5-7/10). She does not have a gall bladder. She has been treating her symptoms at home with Ibuprofen. She has had associated nausea, cough and SOB. Denies vomiting, diarrhea, fever, dysuria and chills. Upon arrival to the ED she have received $RemoveBefor'4mg'FdiyfXoTgRCF$  of IV narcotics and did not receive toradol. Their pain has been poorly controlled despite this. Vital signs have been stable without concern for SIRS. Serum labs have been reviewed and are significant for AKI with creatinine of 1.59 from baseline of 0.79 and BUN of 23. Urinalysis was obtained via clean catch and is negative for infectious parameters. CT imaging was obtained in the ED that demonstrated a 5 mm partially obstructing stone in the mid RIGHT ureter (as well as bilateral parapelvic cysts which are stable from prior imaging).  The patient has no history of diabetes.  Patient does not have a Dealer.  She  history of voiding or storage urinary symptoms, hematuria, UTIs, STDs, urolithiasis, GU malignancy/trauma/surgery.  Past Medical History  Diagnosis Date  . Morbid obesity ([place])   . MIGRAINE HEADACHE   . ARTHRITIS, KNEES, BILATERAL   . DEPRESSION   . GERD   . HYPERLIPIDEMIA   . HYPERTENSION   . DJD (degenerative joint disease) of knee   . Urge incontinence   . HYPOTHYROIDISM     postsurgical  . OSA (obstructive sleep apnea) 01/17/2011 dx  . Hypoxia   . Oxygen dependent   . Breast CA ([place]) 2000    s/p R lumpectomy and XRT  . UTI (lower urinary tract infection) 12/2014    Past Surgical History   Procedure Laterality Date  . Cholecystectomy  1995  . Breast lumpectomy  2000  . Thyroidectomy    . Breast biopsy  2000  . Replacement total knee bilateral  1999, 2005  . Tubal ligation    . Parathyroidectomy      2-3 removed  . Tonsillectomy    . Adenoidectomy    . Right leg femur repaired    . Abdominal hysterectomy    . Cataract extraction      Current Hospital Medications:  Home Meds:    Medication List    ASK your doctor about these medications        ALPRAZolam 0.25 MG tablet  Commonly known as:  XANAX  TAKE 1/2 TO 1 TABLET BY MOUTH TWICE A DAY AS NEEDED FOR ANXIETY OR SLEEP     amLODipine-valsartan 5-160 MG tablet  Commonly known as:  EXFORGE  Take 1 tablet by mouth daily.     aspirin 81 MG tablet  Take 81 mg by mouth daily.     cetirizine 10 MG tablet  Commonly known as:  ZYRTEC  Take 10 mg by mouth daily as needed for allergies.     clotrimazole 1 % cream  Commonly known as:  LOTRIMIN  Apply 1 application topically 2 (two) times daily. As needed for fungal skin infections     FIBER PO  Take 2 tablets by mouth every morning.     Fish Oil 1200 MG Caps  Take 2,400 mg by  mouth daily.     fluconazole 150 MG tablet  Commonly known as:  DIFLUCAN  Take 1 tablet (150 mg total) by mouth once. Take again 3 days later     fluticasone 50 MCG/ACT nasal spray  Commonly known as:  FLONASE  Place 2 sprays into both nostrils daily.     furosemide 40 MG tablet  Commonly known as:  LASIX  TAKE 1 TABLET BY MOUTH EVERY DAY     hydrochlorothiazide 12.5 MG tablet  Commonly known as:  HYDRODIURIL  Take 1 tablet (12.5 mg total) by mouth daily.     isometheptene-acetaminophen-dichloralphenazone 65-100-325 MG capsule  Commonly known as:  MIDRIN  Take 1 capsule by mouth daily as needed for migraine.     loratadine 10 MG tablet  Commonly known as:  CLARITIN  Take 10 mg by mouth daily as needed for allergies.     nystatin 100000 UNIT/GM Powd  APPLY TO AFFECTED  AREA TWICE A DAY     omeprazole 20 MG capsule  Commonly known as:  PRILOSEC  TAKE 1 CAPSULE (20 MG TOTAL) BY MOUTH 2 (TWO) TIMES DAILY.     potassium chloride 10 MEQ CR capsule  Commonly known as:  MICRO-K  Take 4 capsules (40 mEq total) by mouth daily.     SYNTHROID 200 MCG tablet  Generic drug:  levothyroxine  Take 1 tablet (200 mcg total) by mouth daily before breakfast.     torsemide 20 MG tablet  Commonly known as:  DEMADEX  Take 1 tablet (20 mg total) by mouth daily.     traMADol 50 MG tablet  Commonly known as:  ULTRAM  Take 1 tablet (50 mg total) by mouth every 8 (eight) hours as needed.        Scheduled Meds: . amLODipine-valsartan  1 tablet Oral Daily  . aspirin  81 mg Oral Daily  . clotrimazole  1 application Topical BID  . [START ON 06/28/2015] levothyroxine  200 mcg Oral QAC breakfast  . loratadine  10 mg Oral Daily  . nystatin   Topical BID  . omega-3 acid ethyl esters  1 g Oral Daily  . pantoprazole  40 mg Oral Daily  . sodium chloride  3 mL Intravenous Q12H   Continuous Infusions: . sodium chloride     PRN Meds:.acetaminophen **OR** acetaminophen, ALPRAZolam, fluticasone, ondansetron **OR** ondansetron (ZOFRAN) IV  Allergies:  Allergies  Allergen Reactions  . Azithromycin Itching  . Beta Adrenergic Blockers     Depression   . Ciprofloxacin Other (See Comments)    REACTION: edgy and very jumpy   . Codeine Nausea And Vomiting  . Epinephrine Other (See Comments)    REACTION: rapid pulse, sweats  . Klonopin [Clonazepam] Other (See Comments)    Makes her feel very suicidal   . Oxycodone Other (See Comments)    Patient felt like it altered her mental status "Crazy" . Hallucinations later as well.  [name] Paxil [Paroxetine Hydrochloride] Other (See Comments)    Sever depression   . Prednisone     "makes me feel really bad"  . Propoxyphene Hcl Nausea And Vomiting  . Tramadol Other (See Comments)    Feels "jittery"  . Meloxicam Diarrhea  . Vancomycin  Rash    Localized rash related to infusion rate.    Family History  Problem Relation Age of Onset  . Emphysema Sister   . Coronary artery disease Neg Hx     Social History:  reports that she quit smoking about  41 years ago. Her smoking use included Cigarettes. She has a .5 pack-year smoking history. She has never used smokeless tobacco. She reports that she drinks alcohol. She reports that she does not use illicit drugs.  ROS: A complete review of systems was performed.  All systems are negative except for pertinent findings as noted.  Physical Exam:  Vital signs in last 24 hours: Temp:  [97.5 F (36.4 C)-97.8 F (36.6 C)] 97.8 F (36.6 C) (01/08 1848) Pulse Rate:  [76-94] 94 (01/08 1848) Resp:  [12-21] 18 (01/08 1848) BP: (118-140)/(54-103) 131/64 mmHg (01/08 1848) SpO2:  [90 %-99 %] 96 % (01/08 1848) Weight:  [140.6 kg (309 lb 15.5 oz)] 140.6 kg (309 lb 15.5 oz) (01/08 1848) Constitutional:  Alert and oriented, No acute distress Cardiovascular: Regular rate and rhythm, No JVD Respiratory: Normal respiratory effort, Lungs clear bilaterally GI: Abdomen is soft, nontender, nondistended, no abdominal masses GU: No CVA tenderness Lymphatic: No lymphadenopathy Neurologic: Grossly intact, no focal deficits Psychiatric: Normal mood and affect  Laboratory Data:   Recent Labs  06/27/15 1215  WBC 6.9  HGB 12.5  HCT 38.8  PLT 150     Recent Labs  06/27/15 1215  NA 143  K 4.1  CL 105  GLUCOSE 104*  BUN 23*  CALCIUM 8.5*  CREATININE 1.59*     Results for orders placed or performed during the hospital encounter of 06/27/15 (from the past 24 hour(s))  Lipase, blood     Status: None   Collection Time: 06/27/15 12:15 PM  Result Value Ref Range   Lipase 19 11 - 51 U/L  Comprehensive metabolic panel     Status: Abnormal   Collection Time: 06/27/15 12:15 PM  Result Value Ref Range   Sodium 143 135 - 145 mmol/L   Potassium 4.1 3.5 - 5.1 mmol/L   Chloride 105 101 -  111 mmol/L   CO2 27 22 - 32 mmol/L   Glucose, Bld 104 (H) 65 - 99 mg/dL   BUN 23 (H) 6 - 20 mg/dL   Creatinine, Ser 1.59 (H) 0.44 - 1.00 mg/dL   Calcium 8.5 (L) 8.9 - 10.3 mg/dL   Total Protein 6.4 (L) 6.5 - 8.1 g/dL   Albumin 3.1 (L) 3.5 - 5.0 g/dL   AST 18 15 - 41 U/L   ALT 17 14 - 54 U/L   Alkaline Phosphatase 79 38 - 126 U/L   Total Bilirubin 0.5 0.3 - 1.2 mg/dL   GFR calc non Af Amer 29 (L) >60 mL/min   GFR calc Af Amer 34 (L) >60 mL/min   Anion gap 11 5 - 15  CBC     Status: None   Collection Time: 06/27/15 12:15 PM  Result Value Ref Range   WBC 6.9 4.0 - 10.5 K/uL   RBC 4.38 3.87 - 5.11 MIL/uL   Hemoglobin 12.5 12.0 - 15.0 g/dL   HCT 38.8 36.0 - 46.0 %   MCV 88.6 78.0 - 100.0 fL   MCH 28.5 26.0 - 34.0 pg   MCHC 32.2 30.0 - 36.0 g/dL   RDW 14.9 11.5 - 15.5 %   Platelets 150 150 - 400 K/uL  Urinalysis, Routine w reflex microscopic (not at [facility])     Status: None   Collection Time: 06/27/15 12:34 PM  Result Value Ref Range   Color, Urine YELLOW YELLOW   APPearance CLEAR CLEAR   Specific Gravity, Urine 1.009 1.005 - 1.030   pH 6.0 5.0 - 8.0   Glucose,  UA NEGATIVE NEGATIVE mg/dL   Hgb urine dipstick NEGATIVE NEGATIVE   Bilirubin Urine NEGATIVE NEGATIVE   Ketones, ur NEGATIVE NEGATIVE mg/dL   Protein, ur NEGATIVE NEGATIVE mg/dL   Nitrite NEGATIVE NEGATIVE   Leukocytes, UA NEGATIVE NEGATIVE   No results found for this or any previous visit (from the past 240 hour(s)).  Renal Function:  Recent Labs  06/23/15 1914 06/27/15 1215  CREATININE 0.79 1.59*   Estimated Creatinine Clearance: 38.9 mL/min (by C-G formula based on Cr of 1.59).  Radiologic Imaging: Ct Renal Stone Study  06/27/2015  CLINICAL DATA:  Abdominal pain EXAM: CT ABDOMEN AND PELVIS WITHOUT CONTRAST TECHNIQUE: Multidetector CT imaging of the abdomen and pelvis was performed following the standard protocol without IV contrast. COMPARISON:  08/26/2013 FINDINGS: Lower chest: The lung bases are clear. No  pleural or pericardial effusion. Hepatobiliary: No suspicious liver abnormality. Previous cholecystectomy. No biliary dilatation. Pancreas: Unremarkable appearance of the pancreas. Spleen: Again noted is a low-attenuation structure within the spleen measuring 2.9 cm. This likely represents a benign abnormality. Adrenals/Urinary Tract: Normal adrenal glands. Bilateral renal sinus cysts are identified. There is right-sided pelvocaliectasis and hydroureter. Within the mid right ureter there is a stone measuring 5 mm, image 49 of series 2. Urinary bladder appears normal. No left-sided obstructive uropathy. No left renal calculi. Stomach/Bowel: The stomach is within normal limits. The small bowel loops have a normal course and caliber. No obstruction. Multiple distal colonic diverticula noted. No acute inflammation. Vascular/Lymphatic: Calcified atherosclerotic disease involves the abdominal aorta. No aneurysm. No enlarged retroperitoneal or mesenteric adenopathy. No enlarged pelvic or inguinal lymph nodes. Reproductive: Previous hysterectomy. No adnexal mass. Cyst and left ovary measures 3 x 1.7 cm Other: No free fluid or fluid collections within the abdomen or pelvis. Musculoskeletal: There is a scoliosis deformity involving the thoracic and lumbar spine. Associated multi level degenerative disc disease is identified. IMPRESSION: 1. Right mid ureteral calculus is identified measuring 5 mm. 2. Bilateral renal sinus cysts. 3. Aortic atherosclerosis 4. Scoliosis and degenerative disc disease. Electronically Signed   By: Kerby Moors M.D.   On: 06/27/2015 14:15    I independently reviewed the above imaging studies.  Impression/Recommendations: 84yF with partially obstructing 5 mm mid ureteralstone in the setting of AKI. Pain controlled, non-infectious urinary parameters. Of not she has large parapelvic cysts bilaterally  We had a long discussion regarding these findings and our treatment options at this time. We  discussed that she has a fair possibility of passing her stone via medical expulsive therapy only (~50% chance). The only objective parameter that is concerning at this point is her renal function which is elevated significantly from her baseline to 1.59. Rarely does unilateral renal obstruction lead to such a pronounced elevation in creatinine. Therefore, this is likely multifactorial (dehydration, NSAIDs). I recommended that she receive at least 1L fluid resuscitation and recheck of her creatinine. If it remains elevated she may need evaluation by the hospitalist service for medical causes of AKI. If her creatinine is persistently elevated or continues to rise and no non-obstructive cause for AKI is identified she may need to be taken to the OR for cystourethroscopy, bilateral retrograde pyelograms and RIGHT stent placement.  -Monitor creatinine -Medicine service consultation for intrinsic renal causes of AKI -NPO after MN for possible need for intervention -Strain all urine -Flomax -Percocet PRN -IV fluid hydration -Will evaluate again in the morning to determine plans for surgical intervention versus continued medical expulsive therapy based on clinical parameters and patient preference.  Patient discussed with Dr. Tresa Moore.  Star Age 06/27/2015, 6:59 PM   I have seen and examined the patient and agree with above assessment and plan.  Briefly,  S: Rt mid ureteral stone in setting of refractory colic and mild ARF.  O: Mild distress RRR CTAB Moderate Rt CVAT  UA without infectious parameters.  A/P: Agree with brief trial of medical therapy, strongly consider definitive manamgent while in patient.

## 2015-06-27 NOTE — ED Notes (Signed)
PATIENT CAN GO TO FLOOR AT 1813

## 2015-06-28 ENCOUNTER — Encounter (HOSPITAL_COMMUNITY): Payer: Self-pay | Admitting: Anesthesiology

## 2015-06-28 ENCOUNTER — Inpatient Hospital Stay (HOSPITAL_COMMUNITY): Payer: Medicare Other | Admitting: Anesthesiology

## 2015-06-28 ENCOUNTER — Encounter (HOSPITAL_COMMUNITY)
Admission: EM | Disposition: A | Discharge status: Skilled Nursing Facility | Payer: Self-pay | Source: Home / Self Care | Attending: Internal Medicine

## 2015-06-28 ENCOUNTER — Encounter: Payer: Self-pay | Admitting: Occupational Therapy

## 2015-06-28 DIAGNOSIS — N132 Hydronephrosis with renal and ureteral calculous obstruction: Secondary | ICD-10-CM | POA: Diagnosis present

## 2015-06-28 HISTORY — PX: CYSTOSCOPY/URETEROSCOPY/HOLMIUM LASER: SHX6545

## 2015-06-28 LAB — COMPREHENSIVE METABOLIC PANEL
ALT: 33 U/L (ref 14–54)
ANION GAP: 8 (ref 5–15)
AST: 40 U/L (ref 15–41)
Albumin: 3.1 g/dL — ABNORMAL LOW (ref 3.5–5.0)
Alkaline Phosphatase: 90 U/L (ref 38–126)
BILIRUBIN TOTAL: 0.7 mg/dL (ref 0.3–1.2)
BUN: 21 mg/dL — AB (ref 6–20)
CO2: 26 mmol/L (ref 22–32)
Calcium: 7.9 mg/dL — ABNORMAL LOW (ref 8.9–10.3)
Chloride: 105 mmol/L (ref 101–111)
Creatinine, Ser: 1.31 mg/dL — ABNORMAL HIGH (ref 0.44–1.00)
GFR calc Af Amer: 43 mL/min — ABNORMAL LOW (ref >60)
GFR, EST NON AFRICAN AMERICAN: 37 mL/min — AB (ref >60)
Glucose, Bld: 123 mg/dL — ABNORMAL HIGH (ref 65–99)
POTASSIUM: 4.4 mmol/L (ref 3.5–5.1)
Sodium: 139 mmol/L (ref 135–145)
TOTAL PROTEIN: 6.3 g/dL — AB (ref 6.5–8.1)

## 2015-06-28 LAB — SURGICAL PCR SCREEN
MRSA, PCR: NEGATIVE
STAPHYLOCOCCUS AUREUS: NEGATIVE

## 2015-06-28 LAB — GLUCOSE, CAPILLARY: GLUCOSE-CAPILLARY: 103 mg/dL — AB (ref 65–99)

## 2015-06-28 LAB — CBC
HEMATOCRIT: 39.3 % (ref 36.0–46.0)
HEMOGLOBIN: 12.2 g/dL (ref 12.0–15.0)
MCH: 28.6 pg (ref 26.0–34.0)
MCHC: 31 g/dL (ref 30.0–36.0)
MCV: 92 fL (ref 78.0–100.0)
Platelets: 147 10*3/uL — ABNORMAL LOW (ref 150–400)
RBC: 4.27 MIL/uL (ref 3.87–5.11)
RDW: 15.2 % (ref 11.5–15.5)
WBC: 7.6 10*3/uL (ref 4.0–10.5)

## 2015-06-28 SURGERY — CYSTOURETEROSCOPY, USING HOLMIUM LASER
Anesthesia: General | Site: Ureter | Laterality: Right

## 2015-06-28 MED ORDER — EPHEDRINE SULFATE 50 MG/ML IJ SOLN
INTRAMUSCULAR | Status: DC | PRN
  Administered 2015-06-28: 10 mg via INTRAVENOUS
  Administered 2015-06-28: 5 mg via INTRAVENOUS

## 2015-06-28 MED ORDER — FENTANYL CITRATE (PF) 100 MCG/2ML IJ SOLN
INTRAMUSCULAR | Status: AC
  Filled 2015-06-28: qty 2

## 2015-06-28 MED ORDER — CEFAZOLIN SODIUM-DEXTROSE 2-3 GM-% IV SOLR
2.0000 g | Freq: Once | INTRAVENOUS | Status: AC
  Administered 2015-06-28: 2 g via INTRAVENOUS
  Filled 2015-06-28: qty 50

## 2015-06-28 MED ORDER — FENTANYL CITRATE (PF) 100 MCG/2ML IJ SOLN
25.0000 ug | INTRAMUSCULAR | Status: DC | PRN
  Administered 2015-06-28: 25 ug via INTRAVENOUS

## 2015-06-28 MED ORDER — PHENYLEPHRINE HCL 10 MG/ML IJ SOLN
INTRAMUSCULAR | Status: DC | PRN
  Administered 2015-06-28: 40 ug via INTRAVENOUS
  Administered 2015-06-28: 80 ug via INTRAVENOUS
  Administered 2015-06-28: 40 ug via INTRAVENOUS

## 2015-06-28 MED ORDER — ACETAMINOPHEN 500 MG PO TABS
1000.0000 mg | ORAL_TABLET | Freq: Three times a day (TID) | ORAL | Status: AC
Start: 2015-06-28 — End: 2015-06-30
  Administered 2015-06-28 – 2015-06-30 (×6): 1000 mg via ORAL
  Filled 2015-06-28 (×10): qty 2

## 2015-06-28 MED ORDER — MORPHINE SULFATE (PF) 2 MG/ML IV SOLN
2.0000 mg | Freq: Once | INTRAVENOUS | Status: AC
  Administered 2015-06-28: 2 mg via INTRAVENOUS

## 2015-06-28 MED ORDER — LIDOCAINE HCL (CARDIAC) 20 MG/ML IV SOLN
INTRAVENOUS | Status: AC
  Filled 2015-06-28: qty 5

## 2015-06-28 MED ORDER — PROPOFOL 10 MG/ML IV BOLUS
INTRAVENOUS | Status: DC | PRN
  Administered 2015-06-28: 150 mg via INTRAVENOUS

## 2015-06-28 MED ORDER — PROPOFOL 10 MG/ML IV BOLUS
INTRAVENOUS | Status: AC
  Filled 2015-06-28: qty 20

## 2015-06-28 MED ORDER — IOHEXOL 300 MG/ML  SOLN
INTRAMUSCULAR | Status: DC | PRN
  Administered 2015-06-28: 20 mL via URETHRAL

## 2015-06-28 MED ORDER — LIDOCAINE HCL (CARDIAC) 20 MG/ML IV SOLN
INTRAVENOUS | Status: DC | PRN
  Administered 2015-06-28: 50 mg via INTRAVENOUS

## 2015-06-28 MED ORDER — ONDANSETRON HCL 4 MG/2ML IJ SOLN
INTRAMUSCULAR | Status: DC | PRN
  Administered 2015-06-28: 4 mg via INTRAVENOUS

## 2015-06-28 MED ORDER — 0.9 % SODIUM CHLORIDE (POUR BTL) OPTIME
TOPICAL | Status: DC | PRN
  Administered 2015-06-28: 1000 mL

## 2015-06-28 MED ORDER — CEFAZOLIN SODIUM-DEXTROSE 2-3 GM-% IV SOLR
INTRAVENOUS | Status: AC
  Filled 2015-06-28: qty 50

## 2015-06-28 MED ORDER — LACTATED RINGERS IV SOLN
INTRAVENOUS | Status: DC | PRN
  Administered 2015-06-28: 20:00:00 via INTRAVENOUS

## 2015-06-28 MED ORDER — MEPERIDINE HCL 25 MG/ML IJ SOLN: 6.2500 mg | INTRAMUSCULAR | Status: DC | PRN

## 2015-06-28 MED ORDER — PROMETHAZINE HCL 25 MG/ML IJ SOLN: 6.2500 mg | INTRAMUSCULAR | Status: DC | PRN

## 2015-06-28 MED ORDER — FENTANYL CITRATE (PF) 100 MCG/2ML IJ SOLN
INTRAMUSCULAR | Status: DC | PRN
  Administered 2015-06-28 (×2): 25 ug via INTRAVENOUS

## 2015-06-28 MED ORDER — LACTATED RINGERS IV SOLN
INTRAVENOUS | Status: DC
  Administered 2015-06-28: 23:00:00 via INTRAVENOUS

## 2015-06-28 SURGICAL SUPPLY — 21 items
BASKET DAKOTA 1.9FR 11X120 (BASKET) ×2 IMPLANT
CATH INTERMIT  6FR 70CM (CATHETERS) ×2 IMPLANT
CLOTH BEACON ORANGE TIMEOUT ST (SAFETY) ×2 IMPLANT
ELECT REM PT RETURN 9FT ADLT (ELECTROSURGICAL)
ELECTRODE REM PT RTRN 9FT ADLT (ELECTROSURGICAL) IMPLANT
FIBER LASER FLEXIVA 1000 (UROLOGICAL SUPPLIES) IMPLANT
FIBER LASER FLEXIVA 200 (UROLOGICAL SUPPLIES) ×2 IMPLANT
FIBER LASER FLEXIVA 365 (UROLOGICAL SUPPLIES) IMPLANT
FIBER LASER FLEXIVA 550 (UROLOGICAL SUPPLIES) IMPLANT
FIBER LASER TRAC TIP (UROLOGICAL SUPPLIES) IMPLANT
GLOVE BIOGEL M STRL SZ7.5 (GLOVE) ×4 IMPLANT
GOWN STRL REUS W/TWL XL LVL3 (GOWN DISPOSABLE) ×4 IMPLANT
GUIDEWIRE ANG ZIPWIRE 038X150 (WIRE) ×2 IMPLANT
GUIDEWIRE STR DUAL SENSOR (WIRE) ×2 IMPLANT
IV NS IRRIG 3000ML ARTHROMATIC (IV SOLUTION) ×2 IMPLANT
PACK CYSTO (CUSTOM PROCEDURE TRAY) ×2 IMPLANT
SHEATH ACCESS URETERAL 24CM (SHEATH) ×2 IMPLANT
STENT POLARIS 5FRX26 (STENTS) ×2 IMPLANT
SYRINGE 10CC LL (SYRINGE) IMPLANT
SYRINGE IRR TOOMEY STRL 70CC (SYRINGE) IMPLANT
TUBE FEEDING 8FR 16IN STR KANG (MISCELLANEOUS) ×2 IMPLANT

## 2015-06-28 NOTE — Anesthesia Preprocedure Evaluation (Addendum)
Anesthesia Evaluation  Patient identified by MRN, date of birth, ID band Patient awake    Reviewed: Allergy & Precautions, NPO status , Patient's Chart, lab work & pertinent test results  Airway Mallampati: II  TM Distance: >3 FB Neck ROM: Full    Dental no notable dental hx.    Pulmonary sleep apnea, Continuous Positive Airway Pressure Ventilation and Oxygen sleep apnea , former smoker,    Pulmonary exam normal breath sounds clear to auscultation       Cardiovascular hypertension, Pt. on medications Normal cardiovascular exam Rhythm:Regular Rate:Normal     Neuro/Psych negative neurological ROS  negative psych ROS   GI/Hepatic negative GI ROS, Neg liver ROS,   Endo/Other  Morbid obesity  Renal/GU Renal diseasenegative Renal ROS  negative genitourinary   Musculoskeletal Chronic severe lymphedema bil LE   Abdominal (+) + obese,   Peds negative pediatric ROS (+)  Hematology negative hematology ROS (+)   Anesthesia Other Findings   Reproductive/Obstetrics negative OB ROS                            Anesthesia Physical Anesthesia Plan  ASA: III  Anesthesia Plan: General   Post-op Pain Management:    Induction: Intravenous  Airway Management Planned: LMA and Oral ETT  Additional Equipment:   Intra-op Plan:   Post-operative Plan: Extubation in OR  Informed Consent: I have reviewed the patients History and Physical, chart, labs and discussed the procedure including the risks, benefits and alternatives for the proposed anesthesia with the patient or authorized representative who has indicated his/her understanding and acceptance.   Dental advisory given  Plan Discussed with: CRNA  Anesthesia Plan Comments:        Anesthesia Quick Evaluation

## 2015-06-28 NOTE — Care Management Note (Signed)
Case Management Note  Patient Details  Name: ORIANNA BISKUP MRN: [id number] Date of Birth: 05/31/1931  Subjective/Objective:82 y/o f admitted w/Kidney stone. Urology-stone removal. From home.PT cons placed.                    Action/Plan:d/c plan home.   Expected Discharge Date:                  Expected Discharge Plan:  Home/Self Care  In-House Referral:     Discharge planning Services  CM Consult  Post Acute Care Choice:    Choice offered to:     DME Arranged:    DME Agency:     HH Arranged:    HH Agency:     Status of Service:  In process, will continue to follow  Medicare Important Message Given:    Date Medicare IM Given:    Medicare IM give by:    Date Additional Medicare IM Given:    Additional Medicare Important Message give by:     If discussed at [place] of Stay Meetings, dates discussed:    Additional Comments:  Dessa Phi, RN 06/28/2015, 5:01 PM

## 2015-06-28 NOTE — Progress Notes (Signed)
Subjective:  1 - Rt Mid Ureteral Stone - 49mm right mid solitary stone with mild hydro by ER CT 06/26/14 on eval rt flank pain.  2 - Acute Renal Failure - Cr 1.8 on intake labs 06/26/14 up from baseline 1.2. Admits to NSAID use and poor hydration. Cr 1/3 1/9.  Today " Tamara Morales" still has significant right flank pain requiring IV narcotics. Cr improved.   Objective: Vital signs in last 24 hours: Temp:  [97.5 F (36.4 C)-98.8 F (37.1 C)] 97.6 F (36.4 C) (01/09 0619) Pulse Rate:  [76-96] 96 (01/09 0619) Resp:  [12-21] 20 (01/09 0619) BP: (99-140)/(35-103) 113/43 mmHg (01/09 0619) SpO2:  [90 %-99 %] 92 % (01/09 0619) Weight:  [140.6 kg (309 lb 15.5 oz)-141 kg (310 lb 13.6 oz)] 141 kg (310 lb 13.6 oz) (01/09 0619) Last BM Date: 06/27/15  Intake/Output from previous day: 01/08 0701 - 01/09 0700 In: 1127.5 [P.O.:240; I.V.:887.5] Out: 600 [Urine:600] Intake/Output this shift:    NAD, AOx3, sleepy from pain meds RRR Non-labored breathing on room air Moderate Rt CVAT. NO c/c/ce, SCD's in place   Lab Results:   Recent Labs  06/27/15 1215 06/28/15 0446  WBC 6.9 7.6  HGB 12.5 12.2  HCT 38.8 39.3  PLT 150 147*   BMET  Recent Labs  06/27/15 1215 06/28/15 0446  NA 143 139  K 4.1 4.4  CL 105 105  CO2 27 26  GLUCOSE 104* 123*  BUN 23* 21*  CREATININE 1.59* 1.31*  CALCIUM 8.5* 7.9*   PT/INR No results for input(s): LABPROT, INR in the last 72 hours. ABG No results for input(s): PHART, HCO3 in the last 72 hours.  Invalid input(s): PCO2, PO2  Studies/Results: Ct Renal Stone Study  06/27/2015  CLINICAL DATA:  Abdominal pain EXAM: CT ABDOMEN AND PELVIS WITHOUT CONTRAST TECHNIQUE: Multidetector CT imaging of the abdomen and pelvis was performed following the standard protocol without IV contrast. COMPARISON:  08/26/2013 FINDINGS: Lower chest: The lung bases are clear. No pleural or pericardial effusion. Hepatobiliary: No suspicious liver abnormality. Previous  cholecystectomy. No biliary dilatation. Pancreas: Unremarkable appearance of the pancreas. Spleen: Again noted is a low-attenuation structure within the spleen measuring 2.9 cm. This likely represents a benign abnormality. Adrenals/Urinary Tract: Normal adrenal glands. Bilateral renal sinus cysts are identified. There is right-sided pelvocaliectasis and hydroureter. Within the mid right ureter there is a stone measuring 5 mm, image 49 of series 2. Urinary bladder appears normal. No left-sided obstructive uropathy. No left renal calculi. Stomach/Bowel: The stomach is within normal limits. The small bowel loops have a normal course and caliber. No obstruction. Multiple distal colonic diverticula noted. No acute inflammation. Vascular/Lymphatic: Calcified atherosclerotic disease involves the abdominal aorta. No aneurysm. No enlarged retroperitoneal or mesenteric adenopathy. No enlarged pelvic or inguinal lymph nodes. Reproductive: Previous hysterectomy. No adnexal mass. Cyst and left ovary measures 3 x 1.7 cm Other: No free fluid or fluid collections within the abdomen or pelvis. Musculoskeletal: There is a scoliosis deformity involving the thoracic and lumbar spine. Associated multi level degenerative disc disease is identified. IMPRESSION: 1. Right mid ureteral calculus is identified measuring 5 mm. 2. Bilateral renal sinus cysts. 3. Aortic atherosclerosis 4. Scoliosis and degenerative disc disease. Electronically Signed   By: Kerby Moors M.D.   On: 06/27/2015 14:15    Anti-infectives: Anti-infectives    None      Assessment/Plan: 1 - Rt Mid Ureteral Stone - rediscussed continued medical therapy v. Definitive therapy with URS or SWL, she wants to  proceed with right URS and I agree as her symptoms are refracotry and only 50% chance of success with medial therapy alone.  Risks, benefits, alternaitives discussed. Posted to day as add-on for this afternoon. NPO for now.   2 - Acute Renal Failure - likely  multifacotria, improved with hydration, will relieve obsturction with surgery today.    [facility], Keifer Habib 06/28/2015

## 2015-06-28 NOTE — Transfer of Care (Signed)
Immediate Anesthesia Transfer of Care Note  Patient: Tamara Morales  Procedure(s) Performed: Procedure(s): CYSTOSCOPY RIGHT RETROGRAD RIGHT URETEROSCOPY/HOLMIUM LASER WITH RIGHT STENT PLACEMENT (Right)  Patient Location: PACU  Anesthesia Type:General  Level of Consciousness:  sedated, patient cooperative and responds to stimulation  Airway & Oxygen Therapy:Patient Spontanous Breathing and Patient connected to face mask oxgen  Post-op Assessment:  Report given to PACU RN and Post -op Vital signs reviewed and stable  Post vital signs:  Reviewed and stable  Last Vitals:  Filed Vitals:   06/28/15 1308 06/28/15 1726  BP: 99/52 127/62  Pulse: 86 86  Temp: 36.4 C 36.3 C  Resp: 16 16    Complications: No apparent anesthesia complications

## 2015-06-28 NOTE — Brief Op Note (Signed)
06/27/2015 - 06/28/2015  9:23 PM  PATIENT:  Tamara Morales  82 y.o. female  PRE-OPERATIVE DIAGNOSIS:  right ureteral stone  POST-OPERATIVE DIAGNOSIS:  right ureteral stone  PROCEDURE:  Procedure(s): CYSTOSCOPY RIGHT RETROGRAD RIGHT URETEROSCOPY/HOLMIUM LASER WITH RIGHT STENT PLACEMENT (Right)  SURGEON:  Surgeon(s) and Role:    * Alexis Frock, MD - Primary  PHYSICIAN ASSISTANT:   ASSISTANTS: none   ANESTHESIA:   general  EBL:     BLOOD ADMINISTERED:none  DRAINS: none   LOCAL MEDICATIONS USED:  NONE  SPECIMEN:  Source of Specimen:  Rt ureteral stone fragments  DISPOSITION OF SPECIMEN:  [facility] for compositional analysis  COUNTS:  YES  TOURNIQUET:  * No tourniquets in log *  DICTATION: .Other Dictation: Dictation Number [id number]  PLAN OF CARE: Admit to inpatient   PATIENT DISPOSITION:  PACU - hemodynamically stable.   Delay start of Pharmacological VTE agent (>24hrs) due to surgical blood loss or risk of bleeding: not applicable

## 2015-06-28 NOTE — Progress Notes (Signed)
PT Cancellation Note  Patient Details Name: Tamara Morales MRN: [id number] DOB: 25-Aug-1930   Cancelled Treatment:    Reason Eval/Treat Not Completed: Pain limiting ability to participate   [facility] 06/28/2015, 1:27 PM

## 2015-06-28 NOTE — Anesthesia Procedure Notes (Signed)
Procedure Name: LMA Insertion Date/Time: 06/28/2015 8:41 PM Performed by: Anne Fu Pre-anesthesia Checklist: Patient identified, Emergency Drugs available, Suction available, Patient being monitored and Timeout performed Patient Re-evaluated:Patient Re-evaluated prior to inductionOxygen Delivery Method: Circle system utilized Preoxygenation: Pre-oxygenation with 100% oxygen Intubation Type: IV induction Ventilation: Mask ventilation without difficulty LMA: LMA inserted LMA Size: 4.0 Number of attempts: 1 Placement Confirmation: positive ETCO2 and breath sounds checked- equal and bilateral Tube secured with: Tape

## 2015-06-29 ENCOUNTER — Encounter (HOSPITAL_COMMUNITY): Payer: Self-pay | Admitting: Urology

## 2015-06-29 LAB — CBC WITH DIFFERENTIAL/PLATELET
BASOS PCT: 0 %
Basophils Absolute: 0 10*3/uL (ref 0.0–0.1)
Eosinophils Absolute: 0 10*3/uL (ref 0.0–0.7)
Eosinophils Relative: 1 %
HEMATOCRIT: 35.5 % — AB (ref 36.0–46.0)
HEMOGLOBIN: 10.9 g/dL — AB (ref 12.0–15.0)
LYMPHS ABS: 0.9 10*3/uL (ref 0.7–4.0)
LYMPHS PCT: 10 %
MCH: 28.7 pg (ref 26.0–34.0)
MCHC: 30.7 g/dL (ref 30.0–36.0)
MCV: 93.4 fL (ref 78.0–100.0)
MONO ABS: 0.8 10*3/uL (ref 0.1–1.0)
MONOS PCT: 9 %
NEUTROS ABS: 7 10*3/uL (ref 1.7–7.7)
NEUTROS PCT: 80 %
Platelets: 135 10*3/uL — ABNORMAL LOW (ref 150–400)
RBC: 3.8 MIL/uL — ABNORMAL LOW (ref 3.87–5.11)
RDW: 15.3 % (ref 11.5–15.5)
WBC: 8.7 10*3/uL (ref 4.0–10.5)

## 2015-06-29 LAB — COMPREHENSIVE METABOLIC PANEL
ALBUMIN: 2.8 g/dL — AB (ref 3.5–5.0)
ALK PHOS: 79 U/L (ref 38–126)
ALT: 28 U/L (ref 14–54)
ANION GAP: 7 (ref 5–15)
AST: 33 U/L (ref 15–41)
BILIRUBIN TOTAL: 0.6 mg/dL (ref 0.3–1.2)
BUN: 22 mg/dL — ABNORMAL HIGH (ref 6–20)
CALCIUM: 7.4 mg/dL — AB (ref 8.9–10.3)
CO2: 27 mmol/L (ref 22–32)
Chloride: 106 mmol/L (ref 101–111)
Creatinine, Ser: 1.12 mg/dL — ABNORMAL HIGH (ref 0.44–1.00)
GFR calc Af Amer: 52 mL/min — ABNORMAL LOW (ref >60)
GFR, EST NON AFRICAN AMERICAN: 44 mL/min — AB (ref >60)
GLUCOSE: 163 mg/dL — AB (ref 65–99)
POTASSIUM: 3.9 mmol/L (ref 3.5–5.1)
Sodium: 140 mmol/L (ref 135–145)
TOTAL PROTEIN: 5.7 g/dL — AB (ref 6.5–8.1)

## 2015-06-29 LAB — GLUCOSE, CAPILLARY: Glucose-Capillary: 113 mg/dL — ABNORMAL HIGH (ref 65–99)

## 2015-06-29 MED ORDER — TRAMADOL HCL 50 MG PO TABS
100.0000 mg | ORAL_TABLET | Freq: Four times a day (QID) | ORAL | Status: DC | PRN
  Administered 2015-06-29 – 2015-06-30 (×3): 100 mg via ORAL
  Filled 2015-06-29 (×3): qty 2

## 2015-06-29 MED ORDER — DEXTROSE 5 % IV SOLN
1.0000 g | INTRAVENOUS | Status: DC
  Administered 2015-06-29 – 2015-07-01 (×3): 1 g via INTRAVENOUS
  Filled 2015-06-29 (×4): qty 10

## 2015-06-29 NOTE — Evaluation (Signed)
Physical Therapy Evaluation Patient Details Name: Tamara Morales MRN: [id number] DOB: 11/02/1930 Today's Date: 06/29/2015   History of Present Illness  82 yo female admitted with abdominal pain. S/P cystoscopy, R ureteral stent 1/9. Hx of CHF, OSA, HTN, breast cancer. Pt is from [facility]  Clinical Impression  On eval, pt required Min-Mod assist for mobility-walked ~15 feet with RW. Pt is weak and fatigues easily. Do not feel pt would be able to safely and efficiently manage at home alone. At this time, recommend ST rehab at SNF prior to returning home alone to [place].     Follow Up Recommendations SNF    Equipment Recommendations  None recommended by PT    Recommendations for Other Services       Precautions / Restrictions Precautions Precautions: Fall Restrictions Weight Bearing Restrictions: No      Mobility  Bed Mobility Overal bed mobility: Needs Assistance Bed Mobility: Supine to Sit     Supine to sit: HOB elevated;Mod assist     General bed mobility comments: Assist for trunk and bil LEs. Increased time. Moderate reliance on bedrail  Transfers Overall transfer level: Needs assistance Equipment used: Rolling walker (2 wheeled) Transfers: Sit to/from Stand Sit to Stand: Mod assist         General transfer comment: x2. (x1 from elevated bed, x1 from low toilet surface). assist to rise, stabilize, control descent.   Ambulation/Gait Ambulation/Gait assistance: Min guard;+2 safety/equipment Ambulation Distance (Feet): 10 Feet (10'x1, 15'x1) Assistive device: Rolling walker (2 wheeled) Gait Pattern/deviations: Step-through pattern;Decreased stride length     General Gait Details: Assist to stabilze and maneuver safely with walker.   Stairs            Wheelchair Mobility    Modified Rankin (Stroke Patients Only)       Balance                                             Pertinent Vitals/Pain Pain Assessment:  Faces Faces Pain Scale: Hurts even more Pain Location: abdomen Pain Descriptors / Indicators: Sore Pain Intervention(s): Monitored during session;Repositioned    Home Living Family/patient expects to be discharged to:: Skilled nursing facility     Type of Home: Independent living facility       Home Layout: One level Home Equipment: Environmental consultant - 4 wheels;Walker - 2 wheels;Electric scooter;Shower seat;Cane - single point      Prior Function Level of Independence: Independent with assistive device(s)         Comments: pt uses 2 canes in the apartment and scooter for any distance >50'     Hand Dominance        Extremity/Trunk Assessment   Upper Extremity Assessment: Generalized weakness           Lower Extremity Assessment: Generalized weakness      Cervical / Trunk Assessment: Normal  Communication   Communication: No difficulties  Cognition Arousal/Alertness: Awake/alert Behavior During Therapy: WFL for tasks assessed/performed Overall Cognitive Status: Within Functional Limits for tasks assessed                      General Comments      Exercises        Assessment/Plan    PT Assessment Patient needs continued PT services  PT Diagnosis Difficulty walking;Generalized weakness;Acute pain   PT Problem List  Decreased strength;Decreased activity tolerance;Decreased balance;Decreased mobility;Decreased knowledge of use of DME  PT Treatment Interventions DME instruction;Gait training;Functional mobility training;Therapeutic activities;Patient/family education;Balance training;Therapeutic exercise   PT Goals (Current goals can be found in the Care Plan section) Acute Rehab PT Goals Patient Stated Goal: to get to PLOF PT Goal Formulation: With patient/family Time For Goal Achievement: 07/13/15 Potential to Achieve Goals: Good    Frequency Min 3X/week   Barriers to discharge        Co-evaluation               End of Session   Activity  Tolerance: Patient limited by fatigue Patient left: in chair;with call bell/phone within reach           Time: 5909-3112 PT Time Calculation (min) (ACUTE ONLY): 35 min   Charges:   PT Evaluation $PT Eval Moderate Complexity: 1 Procedure PT Treatments $Gait Training: 8-22 mins   PT G Codes:        Weston Anna, MPT Pager: [phone]

## 2015-06-29 NOTE — Progress Notes (Signed)
1 Day Post-Op  Subjective:  1 - Rt Mid Ureteral Stone - 45mm right mid solitary stone with mild hydro by ER CT 06/26/14 on eval rt flank pain. Pain refracotry therefore underwent definitive management with right ureteroscopy / laser stent 1/9, stent removed 1/10 AM.   2 - Acute Renal Failure - Cr 1.8 on intake labs 06/26/14 up from baseline 1.2. Admits to NSAID use and poor hydration. Cr 1.1/10  Today " Tamara Morales" is improved. Some resolving right flank pain and scant hematuria as anticipated, but improving.   Objective: Vital signs in last 24 hours: Temp:  [97.3 F (36.3 C)-97.8 F (36.6 C)] 97.7 F (36.5 C) (01/10 0512) Pulse Rate:  [86-106] 92 (01/10 0512) Resp:  [11-18] 18 (01/10 0512) BP: (99-140)/(52-87) 128/70 mmHg (01/10 0512) SpO2:  [93 %-100 %] 97 % (01/10 0512) Last BM Date: 06/27/15  Intake/Output from previous day: 01/09 0701 - 01/10 0700 In: 3059.2 [P.O.:480; I.V.:2579.2] Out: 500 [Urine:500] Intake/Output this shift:    General appearance: alert, cooperative and uncooperative Eyes: negative Nose: Nares normal. Septum midline. Mucosa normal. No drainage or sinus tenderness. Throat: lips, mucosa, and tongue normal; teeth and gums normal Neck: supple, symmetrical, trachea midline Back: symmetric, no curvature. ROM normal. No CVA tenderness. Resp: non-labored on [place] O2 Cardio: Nl rate GI: soft, non-tender; bowel sounds normal; no masses,  no organomegaly and morbid truncal obesity Pelvic: external genitalia normal and tethered stent string in palce with distal stent seen protruding through vagina, this was removed and discarded, inspected and intact.  Extremities: extremities normal, atraumatic, no cyanosis or edema Pulses: 2+ and symmetric Incision/Wound:  Lab Results:   Recent Labs  06/28/15 0446 06/29/15 0430  WBC 7.6 8.7  HGB 12.2 10.9*  HCT 39.3 35.5*  PLT 147* 135*   BMET  Recent Labs  06/28/15 0446 06/29/15 0430  NA 139 140  K 4.4 3.9  CL 105  106  CO2 26 27  GLUCOSE 123* 163*  BUN 21* 22*  CREATININE 1.31* 1.12*  CALCIUM 7.9* 7.4*   PT/INR No results for input(s): LABPROT, INR in the last 72 hours. ABG No results for input(s): PHART, HCO3 in the last 72 hours.  Invalid input(s): PCO2, PO2  Studies/Results: Ct Renal Stone Study  06/27/2015  CLINICAL DATA:  Abdominal pain EXAM: CT ABDOMEN AND PELVIS WITHOUT CONTRAST TECHNIQUE: Multidetector CT imaging of the abdomen and pelvis was performed following the standard protocol without IV contrast. COMPARISON:  08/26/2013 FINDINGS: Lower chest: The lung bases are clear. No pleural or pericardial effusion. Hepatobiliary: No suspicious liver abnormality. Previous cholecystectomy. No biliary dilatation. Pancreas: Unremarkable appearance of the pancreas. Spleen: Again noted is a low-attenuation structure within the spleen measuring 2.9 cm. This likely represents a benign abnormality. Adrenals/Urinary Tract: Normal adrenal glands. Bilateral renal sinus cysts are identified. There is right-sided pelvocaliectasis and hydroureter. Within the mid right ureter there is a stone measuring 5 mm, image 49 of series 2. Urinary bladder appears normal. No left-sided obstructive uropathy. No left renal calculi. Stomach/Bowel: The stomach is within normal limits. The small bowel loops have a normal course and caliber. No obstruction. Multiple distal colonic diverticula noted. No acute inflammation. Vascular/Lymphatic: Calcified atherosclerotic disease involves the abdominal aorta. No aneurysm. No enlarged retroperitoneal or mesenteric adenopathy. No enlarged pelvic or inguinal lymph nodes. Reproductive: Previous hysterectomy. No adnexal mass. Cyst and left ovary measures 3 x 1.7 cm Other: No free fluid or fluid collections within the abdomen or pelvis. Musculoskeletal: There is a scoliosis deformity involving  the thoracic and lumbar spine. Associated multi level degenerative disc disease is identified. IMPRESSION:  1. Right mid ureteral calculus is identified measuring 5 mm. 2. Bilateral renal sinus cysts. 3. Aortic atherosclerosis 4. Scoliosis and degenerative disc disease. Electronically Signed   By: Kerby Moors M.D.   On: 06/27/2015 14:15    Anti-infectives: Anti-infectives    Start     Dose/Rate Route Frequency Ordered Stop   06/28/15 2100  [MAR Hold]  ceFAZolin (ANCEF) IVPB 2 g/50 mL premix     (MAR Hold since 06/28/15 2032)   2 g 100 mL/hr over 30 Minutes Intravenous  Once 06/28/15 2025 06/28/15 2034      Assessment/Plan:  1 - Rt Mid Ureteral Stone - now stone free. OK for DC at anytime from GU perspective, will prob require PO percocet or similar for home for resolving pain prn. I will arrange GU follow-up with me in about 6 weeks.   2 - Acute Renal Failure - resolved.  Call with questions anytime.   [facility], Tamara Morales 06/29/2015

## 2015-06-29 NOTE — Progress Notes (Signed)
[facility] PROGRESS NOTE  Tamara Morales OZH:[id number] DOB: [birth year] DOA: 06/27/2015 PCP: Tana Conch, MD  Summary 06/28/15: Patient with right ureteral stone. Appreciate urology. To have lithotripsy. Chills but no fever. Continue analgesics/ivf, and give antibiotics if fever. 06/29/15: Appreciate urology. Patient now s/p "1. Cystoscopy with right retrograde pyelogram and interpretation. 2. Right ureteroscopy with laser lithotripsy. 3. Insertion of right ureteral stent, 5 x 26 [place] with tether". She complains of pain and chills. I'm concerned that she may have infection. Will therefore give Ceftriaxone. Patient seems to have intolerance to medications like tramadol/oxycodone/codeine but not true allergies. Will try tramadol in-house and continue morphine as needed. Discussed care plan with patient's daughter at the bedside. Plan Abdominal pain, RLQ (right lower quadrant)/Right ureteral calculus/Ureteral stone with hydronephrosis  Ceftriaxone  Tramadol  Follow urology recommendations Acute renal failure superimposed on stage 4 chronic kidney disease (HCC)  IVF Hypothyroidism/Morbid obesity (HCC)/Essential hypertension/Obesity hypoventilation syndrome (HCC)/Anxiety and depression/History of breast cancer  Stable  Continue current management Code Status: Full Code Family Communication: Daughter at bedside Disposition Plan: Home eventually   Consultants:  Urology  Procedures:  Urology  Antibiotics:  Ceftriaxone 06/29/2015>  HPI/Subjective: Complains of right costophrenic angle pain.  Objective: Filed Vitals:   06/29/15 1355 06/29/15 2051  BP: 129/66 157/74  Pulse: 95 106  Temp: 97.8 F (36.6 C) 99.2 F (37.3 C)  Resp: 17 18    Intake/Output Summary (Last 24 hours) at 06/29/15 2150 Last data filed at 06/29/15 1810  Gross per 24 hour  Intake 1999.17 ml  Output    650 ml  Net 1349.17 ml   Filed Weights   06/27/15 1848 06/28/15 0619  Weight:  140.6 kg (309 lb 15.5 oz) 141 kg (310 lb 13.6 oz)    Exam:   General:  Comfortable at rest.  Cardiovascular: S1-S2 normal. No murmurs. Pulse regular.  Respiratory: Good air entry bilaterally. No rhonchi or rales.  Abdomen: Soft and nontender. Normal bowel sounds. No organomegaly.  Musculoskeletal: No pedal edema   Neurological: Intact  Data Reviewed: Basic Metabolic Panel:  Recent Labs Lab 06/23/15 1914 06/27/15 1215 06/28/15 0446 06/29/15 0430  NA 140 143 139 140  K 3.7 4.1 4.4 3.9  CL 101 105 105 106  CO2 28 27 26 27   GLUCOSE 94 104* 123* 163*  BUN 11 23* 21* 22*  CREATININE 0.79 1.59* 1.31* 1.12*  CALCIUM 8.9 8.5* 7.9* 7.4*   Liver Function Tests:  Recent Labs Lab 06/23/15 1914 06/27/15 1215 06/28/15 0446 06/29/15 0430  AST 21 18 40 33  ALT 17 17 33 28  ALKPHOS 85 79 90 79  BILITOT 0.7 0.5 0.7 0.6  PROT 7.2 6.4* 6.3* 5.7*  ALBUMIN 4.1 3.1* 3.1* 2.8*    Recent Labs Lab 06/27/15 1215  LIPASE 19   No results for input(s): AMMONIA in the last 168 hours. CBC:  Recent Labs Lab 06/23/15 1918 06/27/15 1215 06/28/15 0446 06/29/15 0430  WBC 7.3 6.9 7.6 8.7  NEUTROABS  --   --   --  7.0  HGB 14.3 12.5 12.2 10.9*  HCT 43.6 38.8 39.3 35.5*  MCV 86.1 88.6 92.0 93.4  PLT  --  150 147* 135*   Cardiac Enzymes: No results for input(s): CKTOTAL, CKMB, CKMBINDEX, TROPONINI in the last 168 hours. BNP (last 3 results)  Recent Labs  11/03/14 0638 12/21/14 0940 01/07/15 1745  BNP 63.6 74.2 149.7*    ProBNP (last 3 results) No results for input(s): PROBNP in the last 8760  hours.  CBG:  Recent Labs Lab 06/28/15 0832 06/29/15 0730  GLUCAP 103* 113*    Recent Results (from the past 240 hour(s))  Surgical pcr screen     Status: None   Collection Time: 06/28/15  6:27 PM  Result Value Ref Range Status   MRSA, PCR NEGATIVE NEGATIVE Final   Staphylococcus aureus NEGATIVE NEGATIVE Final    Comment:        The Xpert SA Assay (FDA approved for  NASAL specimens in patients over 71 years of age), is one component of a comprehensive surveillance program.  Test performance has been validated by [facility] for patients greater than or equal to 98 year old. It is not intended to diagnose infection nor to guide or monitor treatment.      Studies: No results found.  Scheduled Meds: . acetaminophen  1,000 mg Oral Q8H  . amLODipine  5 mg Oral Daily   And  . irbesartan  150 mg Oral Daily  . aspirin EC  81 mg Oral Daily  . cefTRIAXone (ROCEPHIN)  IV  1 g Intravenous Q24H  . levothyroxine  200 mcg Oral QAC breakfast  . nystatin   Topical BID  . omega-3 acid ethyl esters  1 g Oral Daily  . pantoprazole  40 mg Oral Daily  . sodium chloride  3 mL Intravenous Q12H   Continuous Infusions: . sodium chloride 75 mL/hr at 06/29/15 1810  . lactated ringers 50 mL/hr at 06/28/15 2325     Time spent: 25 minutes    [name]  [facility] Pager [phone]. If 7PM-7AM, please contact night-coverage at [email or web address], password [facility] 06/29/2015, 9:50 PM  LOS: 2 days

## 2015-06-29 NOTE — NC FL2 (Signed)
[place] LEVEL OF CARE SCREENING TOOL     IDENTIFICATION  Patient Name: Tamara Morales Birthdate: 11-Dec-1930 Sex: female Admission Date (Current Location): 06/27/2015  [facility] and [place] Number:  Herbalist and Address:  [facility],  [place] [address], [place]      Provider Number: [id number]  Attending Physician Name and Address:  Nat Math, MD  Relative Name and Phone Number:       Current Level of Care: Hospital Recommended Level of Care: [place] Prior Approval Number:    Date Approved/Denied:   PASRR Number: [id number]  Discharge Plan: SNF    Current Diagnoses: Patient Active Problem List   Diagnosis Date Noted  . Ureteral stone with hydronephrosis 06/28/2015  . Acute renal failure superimposed on stage 4 chronic kidney disease ([place]) 06/27/2015  . Right ureteral calculus 06/27/2015  . Anxiety and depression 06/27/2015  . History of breast cancer 06/27/2015  . Abdominal pain, RLQ (right lower quadrant) 06/27/2015  . AKI (acute kidney injury) ([place])   . Obesity hypoventilation syndrome ([place]) 11/27/2014  . Morbid obesity ([place]) 10/04/2009  . Hypothyroidism 08/05/2009  . Essential hypertension 08/05/2009    Orientation RESPIRATION BLADDER Height & Weight    Self, Time, Situation, Place  O2 (4L) Continent $RemoveBefore'5\' 3"'[id number]$  (160 cm) 310 lbs.  BEHAVIORAL SYMPTOMS/MOOD NEUROLOGICAL BOWEL NUTRITION STATUS      Continent Diet (regular)  AMBULATORY STATUS COMMUNICATION OF NEEDS Skin   Extensive Assist Verbally Normal                       Personal Care Assistance Level of Assistance  Bathing, Feeding, Dressing Bathing Assistance: Limited assistance Feeding assistance: Limited assistance Dressing Assistance: Limited assistance     Functional Limitations Info             SPECIAL CARE FACTORS FREQUENCY  PT (By licensed PT), OT (By licensed OT)     PT Frequency: 5 OT Frequency: 5             Contractures      Additional Factors Info  Code Status, Allergies Code Status Info: Fullcode Allergies Info: Azithromycin, Beta Adrenergic Blockers, Ciprofloxacin, Codeine, Epinephrine, Klonopin, Oxycodone, Paxil, Prednisone, Propoxyphene Hcl, Tramadol, Meloxicam, Vancomycin           Current Medications (06/29/2015):  This is the current hospital active medication list Current Facility-Administered Medications  Medication Dose Route Frequency Provider Last Rate Last Dose  . 0.9 %  sodium chloride infusion   Intravenous Continuous Theodis Blaze, MD 75 mL/hr at 06/28/15 0654    . acetaminophen (TYLENOL) tablet 650 mg  650 mg Oral Q6H PRN Theodis Blaze, MD       Or  . acetaminophen (TYLENOL) suppository 650 mg  650 mg Rectal Q6H PRN Theodis Blaze, MD      . acetaminophen (TYLENOL) tablet 1,000 mg  1,000 mg Oral Q8H Alexis Frock, MD   1,000 mg at 06/29/15 1217  . ALPRAZolam (XANAX) tablet 0.125-0.25 mg  0.125-0.25 mg Oral BID PRN Theodis Blaze, MD      . amLODipine (NORVASC) tablet 5 mg  5 mg Oral Daily Theodis Blaze, MD   5 mg at 06/29/15 1031   And  . irbesartan (AVAPRO) tablet 150 mg  150 mg Oral Daily Theodis Blaze, MD   150 mg at 06/29/15 1031  . aspirin EC tablet 81 mg  81 mg Oral Daily Iskra M  Doyle Askew, MD   81 mg at 06/29/15 1032  . fluticasone (FLONASE) 50 MCG/ACT nasal spray 2 spray  2 spray Each Nare Daily PRN Theodis Blaze, MD      . lactated ringers infusion   Intravenous Continuous Anne Fu, CRNA 50 mL/hr at 06/28/15 2325    . levothyroxine (SYNTHROID, LEVOTHROID) tablet 200 mcg  200 mcg Oral QAC breakfast Theodis Blaze, MD   200 mcg at 06/29/15 1031  . loratadine (CLARITIN) tablet 10 mg  10 mg Oral Daily PRN Theodis Blaze, MD   10 mg at 06/27/15 2149  . morphine 2 MG/ML injection 2 mg  2 mg Intravenous Q3H PRN Theodis Blaze, MD   2 mg at 06/29/15 1031  . nystatin (MYCOSTATIN/NYSTOP) topical powder   Topical BID Theodis Blaze, MD      . omega-3 acid ethyl esters  (LOVAZA) capsule 1 g  1 g Oral Daily Theodis Blaze, MD   1 g at 06/29/15 1030  . ondansetron (ZOFRAN) tablet 4 mg  4 mg Oral Q6H PRN Theodis Blaze, MD       Or  . ondansetron [facility]) injection 4 mg  4 mg Intravenous Q6H PRN Theodis Blaze, MD   4 mg at 06/28/15 0456  . pantoprazole (PROTONIX) EC tablet 40 mg  40 mg Oral Daily Theodis Blaze, MD   40 mg at 06/29/15 1032  . sodium chloride 0.9 % injection 3 mL  3 mL Intravenous Q12H Theodis Blaze, MD   3 mL at 06/28/15 2325     Discharge Medications: Please see discharge summary for a list of discharge medications.  Relevant Imaging Results:  Relevant Lab Results:   Additional Information SSN: [id number]  Standley Brooking, LCSW

## 2015-06-29 NOTE — Clinical Social Work Placement (Signed)
   CLINICAL SOCIAL WORK PLACEMENT  NOTE  Date:  06/29/2015  Patient Details  Name: Tamara Morales MRN: [id number] Date of Birth: January 09, 1931  Clinical Social Work is seeking post-discharge placement for this patient at the [place] level of care (*CSW will initial, date and re-position this form in  chart as items are completed):  Yes   Patient/family provided with [facility]'s list of facilities offering this level of care within the geographic area requested by the patient (or if unable, by the patient's family).  Yes   Patient/family informed of their freedom to choose among providers that offer the needed level of care, that participate in Medicare, Medicaid or managed care program needed by the patient, have an available bed and are willing to accept the patient.  Yes   Patient/family informed of [facility]'s ownership interest in [facility] and [facility], as well as of the fact that they are under no obligation to receive care at these facilities.  PASRR submitted to EDS on 06/29/15     PASRR number received on 06/29/15     Existing PASRR number confirmed on       FL2 transmitted to all facilities in geographic area requested by pt/family on 06/29/15     FL2 transmitted to all facilities within larger geographic area on       Patient informed that his/her managed care company has contracts with or will negotiate with certain facilities, including the following:        Yes   Patient/family informed of bed offers received.  Patient chooses bed at [facility]     Physician recommends and patient chooses bed at      Patient to be transferred to [facility] on  .  Patient to be transferred to facility by       Patient family notified on   of transfer.  Name of family member notified:        PHYSICIAN       Additional Comment:     _______________________________________________ Standley Brooking, LCSW 06/29/2015, 12:53 PM

## 2015-06-29 NOTE — Progress Notes (Addendum)
[facility] PROGRESS NOTE  Tamara THEARD ZOX:[id number] DOB: [birth year] DOA: 06/27/2015 PCP: Tana Conch, MD  Summary 06/28/15: Patient with right ureteral stone. Appreciate urology. To have lithotripsy. Chills but no fever. Continue analgesics/ivf, and give antibiotics if fever. Plan Abdominal pain, RLQ (right lower quadrant)/Right ureteral calculus/Ureteral stone with hydronephrosis  Defer management to urology. Acute renal failure superimposed on stage 4 chronic kidney disease (HCC)  IVF   Hypothyroidism/Morbid obesity (HCC)/Essential hypertension/Obesity hypoventilation syndrome (HCC)/Anxiety and depression/History of breast cancer  Stable  Continue current management Code Status: Full Code Family Communication: Daughter at bedside Disposition Plan: Home eventually   Consultants:  Urology  Procedures:    Antibiotics:    HPI/Subjective: Pain controlled.  Objective: Filed Vitals:   06/28/15 2300 06/28/15 2315  BP: 131/65 133/65  Pulse: 101 100  Temp:  97.8 F (36.6 C)  Resp: 15 16    Intake/Output Summary (Last 24 hours) at 06/29/15 0013 Last data filed at 06/28/15 2136  Gross per 24 hour  Intake 1897.5 ml  Output    700 ml  Net 1197.5 ml   Filed Weights   06/27/15 1848 06/28/15 0619  Weight: 140.6 kg (309 lb 15.5 oz) 141 kg (310 lb 13.6 oz)    Exam:   General:  Comfortable at rest.  Cardiovascular: S1-S2 normal. No murmurs. Pulse regular.  Respiratory: Good air entry bilaterally. No rhonchi or rales.  Abdomen: Soft and nontender. Normal bowel sounds. No organomegaly.  Musculoskeletal: No pedal edema   Neurological: Intact  Data Reviewed: Basic Metabolic Panel:  Recent Labs Lab 06/23/15 1914 06/27/15 1215 06/28/15 0446  NA 140 143 139  K 3.7 4.1 4.4  CL 101 105 105  CO2 28 27 26   GLUCOSE 94 104* 123*  BUN 11 23* 21*  CREATININE 0.79 1.59* 1.31*  CALCIUM 8.9 8.5* 7.9*   Liver Function Tests:  Recent Labs Lab  06/23/15 1914 06/27/15 1215 06/28/15 0446  AST 21 18 40  ALT 17 17 33  ALKPHOS 85 79 90  BILITOT 0.7 0.5 0.7  PROT 7.2 6.4* 6.3*  ALBUMIN 4.1 3.1* 3.1*    Recent Labs Lab 06/27/15 1215  LIPASE 19   No results for input(s): AMMONIA in the last 168 hours. CBC:  Recent Labs Lab 06/23/15 1918 06/27/15 1215 06/28/15 0446  WBC 7.3 6.9 7.6  HGB 14.3 12.5 12.2  HCT 43.6 38.8 39.3  MCV 86.1 88.6 92.0  PLT  --  150 147*   Cardiac Enzymes: No results for input(s): CKTOTAL, CKMB, CKMBINDEX, TROPONINI in the last 168 hours. BNP (last 3 results)  Recent Labs  11/03/14 0638 12/21/14 0940 01/07/15 1745  BNP 63.6 74.2 149.7*    ProBNP (last 3 results) No results for input(s): PROBNP in the last 8760 hours.  CBG:  Recent Labs Lab 06/28/15 0832  GLUCAP 103*    Recent Results (from the past 240 hour(s))  Surgical pcr screen     Status: None   Collection Time: 06/28/15  6:27 PM  Result Value Ref Range Status   MRSA, PCR NEGATIVE NEGATIVE Final   Staphylococcus aureus NEGATIVE NEGATIVE Final    Comment:        The Xpert SA Assay (FDA approved for NASAL specimens in patients over 17 years of age), is one component of a comprehensive surveillance program.  Test performance has been validated by [facility] for patients greater than or equal to 26 year old. It is not intended to diagnose infection nor to guide or monitor  treatment.      Studies: Ct Renal Stone Study  06/27/2015  CLINICAL DATA:  Abdominal pain EXAM: CT ABDOMEN AND PELVIS WITHOUT CONTRAST TECHNIQUE: Multidetector CT imaging of the abdomen and pelvis was performed following the standard protocol without IV contrast. COMPARISON:  08/26/2013 FINDINGS: Lower chest: The lung bases are clear. No pleural or pericardial effusion. Hepatobiliary: No suspicious liver abnormality. Previous cholecystectomy. No biliary dilatation. Pancreas: Unremarkable appearance of the pancreas. Spleen: Again noted is a  low-attenuation structure within the spleen measuring 2.9 cm. This likely represents a benign abnormality. Adrenals/Urinary Tract: Normal adrenal glands. Bilateral renal sinus cysts are identified. There is right-sided pelvocaliectasis and hydroureter. Within the mid right ureter there is a stone measuring 5 mm, image 49 of series 2. Urinary bladder appears normal. No left-sided obstructive uropathy. No left renal calculi. Stomach/Bowel: The stomach is within normal limits. The small bowel loops have a normal course and caliber. No obstruction. Multiple distal colonic diverticula noted. No acute inflammation. Vascular/Lymphatic: Calcified atherosclerotic disease involves the abdominal aorta. No aneurysm. No enlarged retroperitoneal or mesenteric adenopathy. No enlarged pelvic or inguinal lymph nodes. Reproductive: Previous hysterectomy. No adnexal mass. Cyst and left ovary measures 3 x 1.7 cm Other: No free fluid or fluid collections within the abdomen or pelvis. Musculoskeletal: There is a scoliosis deformity involving the thoracic and lumbar spine. Associated multi level degenerative disc disease is identified. IMPRESSION: 1. Right mid ureteral calculus is identified measuring 5 mm. 2. Bilateral renal sinus cysts. 3. Aortic atherosclerosis 4. Scoliosis and degenerative disc disease. Electronically Signed   By: Signa Kell M.D.   On: 06/27/2015 14:15    Scheduled Meds: . acetaminophen  1,000 mg Oral Q8H  . amLODipine  5 mg Oral Daily   And  . irbesartan  150 mg Oral Daily  . aspirin EC  81 mg Oral Daily  . fentaNYL      . levothyroxine  200 mcg Oral QAC breakfast  . nystatin   Topical BID  . omega-3 acid ethyl esters  1 g Oral Daily  . pantoprazole  40 mg Oral Daily  . sodium chloride  3 mL Intravenous Q12H   Continuous Infusions: . sodium chloride 75 mL/hr at 06/28/15 0654  . lactated ringers 50 mL/hr at 06/28/15 2325     Time spent: 25 minutes    Shaquil Aldana  [facility] Pager [phone]. If 7PM-7AM, please contact night-coverage at [email or web address], password [facility] 06/29/2015, 12:13 AM  LOS: 2 days

## 2015-06-29 NOTE — Clinical Social Work Note (Signed)
Clinical Social Work Assessment  Patient Details  Name: Tamara Morales MRN: [id number] Date of Birth: 12-21-1930  Date of referral:  06/29/15               Reason for consult:  Facility Placement                Permission sought to share information with:  Facility Art therapist granted to share information::  Yes, Verbal Permission Granted  Name::        Agency::     Relationship::     Contact Information:     Housing/Transportation Living arrangements for the past 2 months:  [place] of Information:  Patient Patient Interpreter Needed:  None Criminal Activity/Legal Involvement Pertinent to Current Situation/Hospitalization:  No - Comment as needed Significant Relationships:  Adult Children Lives with:  Facility Resident Do you feel safe going back to the place where you live?  No Need for family participation in patient care:  No (Coment)  Care giving concerns:  CSW received consult that patient was admitted from [place] living at [facility], PT recommended patient go to SNF at discharge.    Social Worker assessment / plan:  CSW confirmed with patient that she is agreeable to go to SNF at [place] also confirmed with Dia Crawford at [facility] that they would have a bed in their skilled section for her.   Employment status:  Retired Forensic scientist:  Medicare PT Recommendations:  Halfway / Referral to community resources:  [place]  Patient/Family's Response to care:  Patient is relieved to hear that there is availability at [facility] in their SNF.   Patient/Family's Understanding of and Emotional Response to Diagnosis, Current Treatment, and Prognosis: Patient was in pain and asked that the nurse get her something to help with that.   Emotional Assessment Appearance:  Appears stated age Attitude/Demeanor/Rapport:    Affect  (typically observed):    Orientation:  Oriented to Self, Oriented to Place, Oriented to  Time, Oriented to Situation Alcohol / Substance use:    Psych involvement (Current and /or in the community):     Discharge Needs  Concerns to be addressed:    Readmission within the last 30 days:    Current discharge risk:    Barriers to Discharge:      Standley Brooking, LCSW 06/29/2015, 12:33 PM

## 2015-06-29 NOTE — Op Note (Signed)
NAMEMarland Kitchen  Morales, Tamara Morales NO.:  [id number]  MEDICAL RECORD NO.:  [id number]  LOCATION:  9024                         FACILITY:  [facility]  PHYSICIAN:  Alexis Frock, MD     DATE OF BIRTH:  July 08, 1930  DATE OF PROCEDURE: 06/28/2015                               OPERATIVE REPORT  DIAGNOSIS:  Right ureteral stone with refractory colic.  PROCEDURES: 1. Cystoscopy with right retrograde pyelogram and interpretation. 2. Right ureteroscopy with laser lithotripsy. 3. Insertion of right ureteral stent, 5 x 26 Polaris with tether.  ESTIMATED BLOOD LOSS:  Nil.  COMPLICATION:  None.  SPECIMENS:  Right ureteral stone fragments for compositional analysis.  FINDINGS: 1. Mild hydroureteronephrosis with filling defect that was mobile in     the midureter consistent with known stone. 2. Retrograde positioning of ureteral stone into the upper pole calyx. 3. Complete resolution of all stone fragments larger than 1/3rd mm     following holmium laser lithotripsy and basket extraction. 4. Successful placement of right ureteral stent, 5 x 26, Polaris,     proximal in the renal pelvis and distal in the urinary bladder with     tether.  INDICATIONS:  Tamara Morales is a morbidly obese 82 year old lady with history of congestive heart failure and recurrent nephrolithiasis.  She was found on workup of colic area of flank pain to have the right midureteral stone by the ERCP yesterday.  She was admitted for hydration and medical expulsive therapy and she also had acute renal failure.  Her kidney function improved with hydration; however, symptoms with colic remained refractory, requiring near around-the-clock IV morphine. Options were discussed for management including continued medical therapy versus definitive management with ureteroscopy given her body habitus and she wished to proceed with the latter.  Informed consent was obtained and placed in the medical record.  PROCEDURE IN DETAIL:   The patient being Tamara Morales, was verified. Procedure being right ureteroscopic stone manipulation was confirmed. Procedure was carried out.  Time-out was performed.  Intravenous antibiotics were administered.  General LMA anesthesia was introduced. The patient was placed into a low lithotomy position and sterile field was created by prepping and draping the patient's vagina, introitus, and proximal thighs using iodine x3.  Next, cystourethroscopy was performed using a 21-French rigid cystoscope with 30-degree offset lens. Inspection of the urinary bladder revealed no diverticula, calcifications, or papular lesions.  Ureteral orifices appeared singleton bilaterally.  The right ureteral orifice was cannulated with a 6-French end-hold catheter and right retrograde pyelogram was obtained.  Right retrograde pyelogram demonstrated a somewhat tortuous right ureter with single-system right kidney with some narrow infundibula likely consistent with known parapelvic cyst.  There was appeared to be a mobile filling defect in the midureter consistent with known stone with mild hydronephrosis.  Above this, a 0.038 zip wire was advanced at the level of the upper pole and set aside as a safety wire.  Next, an 8- [place] feeding tube was placed in the urinary bladder for pressure release.  Next, semi-rigid ureteroscopy was performed in the distal half of the right ureter alongside a separate Sensor working wire.  No mucosal abnormalities were found.  The semi-rigid ureteroscope was exchanged for  the 12/14, 24-cm ureteral access sheath at the level of the midureter using continuous fluoroscopic guidance and flexible digital ureteroscopy was performed to the proximal ureter using a flexible single-channel ureteroscope.  Inspection of the proximal ureter revealed some mild tortuosity without focal stricture.  Inspection of the kidneys revealed retrograde positioning of prior right midureteral stone  fragment to the upper pole calyx.  It was appeared to be approximately a 5-6 mm consistent with known prior ureteral stone. There were no significant additional calcifications in the kidney.  This stone did appear to be too large for simple basketing.  As such, holmium laser energy was applied to the stone using settings of 0.2 joules and 10 hertz, fragmenting the stone approximately four smaller pieces. These were then grasped sequentially on their long axis with an Escape- type basket and removed in their entirety, set aside for compositional analysis.  The access sheath was removed under continuous ureteroscopic vision.  No mucosal abnormalities were found.  Given the use of access sheath, it was felt that brief interval stenting would be warranted.  As such, a new 5 x 26 Polaris-type stent was placed to the remaining safety wire using fluoroscopic guidance.  Good proximal and upper pole, distal in the urinary bladder noted.  Tether was left in place and fashioned to the thigh and procedure was terminated.  The patient tolerated the procedure well.  There were no immediate preprocedural complications. The patient was taken to the postanesthesia care unit in stable condition.          ______________________________ Alexis Frock, MD     TM/MEDQ  D:  06/28/2015  T:  06/29/2015  Job:  [id number]

## 2015-06-30 ENCOUNTER — Other Ambulatory Visit: Payer: Self-pay

## 2015-06-30 ENCOUNTER — Inpatient Hospital Stay (HOSPITAL_COMMUNITY): Payer: Medicare Other

## 2015-06-30 ENCOUNTER — Encounter: Payer: Self-pay | Admitting: Occupational Therapy

## 2015-06-30 DIAGNOSIS — I10 Essential (primary) hypertension: Secondary | ICD-10-CM

## 2015-06-30 DIAGNOSIS — R1031 Right lower quadrant pain: Secondary | ICD-10-CM

## 2015-06-30 DIAGNOSIS — G9340 Encephalopathy, unspecified: Secondary | ICD-10-CM

## 2015-06-30 DIAGNOSIS — J181 Lobar pneumonia, unspecified organism: Secondary | ICD-10-CM

## 2015-06-30 DIAGNOSIS — N390 Urinary tract infection, site not specified: Secondary | ICD-10-CM

## 2015-06-30 DIAGNOSIS — N201 Calculus of ureter: Secondary | ICD-10-CM

## 2015-06-30 LAB — BLOOD GAS, ARTERIAL
ACID-BASE DEFICIT: 3.1 mmol/L — AB (ref 0.0–2.0)
BICARBONATE: 23.9 meq/L (ref 20.0–24.0)
Drawn by: 307971
O2 Content: 12 L/min
O2 SAT: 99.1 %
PATIENT TEMPERATURE: 98.6
PCO2 ART: 55.3 mmHg — AB (ref 35.0–45.0)
PO2 ART: 175 mmHg — AB (ref 80.0–100.0)
TCO2: 22.6 mmol/L (ref 0–100)
pH, Arterial: 7.259 — ABNORMAL LOW (ref 7.350–7.450)

## 2015-06-30 LAB — COMPREHENSIVE METABOLIC PANEL
ALBUMIN: 2.8 g/dL — AB (ref 3.5–5.0)
ALT: 32 U/L (ref 14–54)
ANION GAP: 6 (ref 5–15)
AST: 49 U/L — ABNORMAL HIGH (ref 15–41)
Alkaline Phosphatase: 88 U/L (ref 38–126)
BILIRUBIN TOTAL: 0.6 mg/dL (ref 0.3–1.2)
BUN: 16 mg/dL (ref 6–20)
CALCIUM: 7.6 mg/dL — AB (ref 8.9–10.3)
CO2: 29 mmol/L (ref 22–32)
Chloride: 105 mmol/L (ref 101–111)
Creatinine, Ser: 1 mg/dL (ref 0.44–1.00)
GFR, EST AFRICAN AMERICAN: 59 mL/min — AB (ref >60)
GFR, EST NON AFRICAN AMERICAN: 51 mL/min — AB (ref >60)
Glucose, Bld: 113 mg/dL — ABNORMAL HIGH (ref 65–99)
POTASSIUM: 4.2 mmol/L (ref 3.5–5.1)
Sodium: 140 mmol/L (ref 135–145)
TOTAL PROTEIN: 6.1 g/dL — AB (ref 6.5–8.1)

## 2015-06-30 LAB — CBC WITH DIFFERENTIAL/PLATELET
BASOS ABS: 0 10*3/uL (ref 0.0–0.1)
BASOS PCT: 0 %
Eosinophils Absolute: 0.1 10*3/uL (ref 0.0–0.7)
Eosinophils Relative: 1 %
HEMATOCRIT: 37.1 % (ref 36.0–46.0)
Hemoglobin: 11.4 g/dL — ABNORMAL LOW (ref 12.0–15.0)
Lymphocytes Relative: 10 %
Lymphs Abs: 0.8 10*3/uL (ref 0.7–4.0)
MCH: 28.8 pg (ref 26.0–34.0)
MCHC: 30.7 g/dL (ref 30.0–36.0)
MCV: 93.7 fL (ref 78.0–100.0)
MONO ABS: 1 10*3/uL (ref 0.1–1.0)
Monocytes Relative: 14 %
NEUTROS ABS: 5.4 10*3/uL (ref 1.7–7.7)
Neutrophils Relative %: 75 %
Platelets: 137 10*3/uL — ABNORMAL LOW (ref 150–400)
RBC: 3.96 MIL/uL (ref 3.87–5.11)
RDW: 15.3 % (ref 11.5–15.5)
WBC: 7.3 10*3/uL (ref 4.0–10.5)

## 2015-06-30 LAB — GLUCOSE, CAPILLARY
GLUCOSE-CAPILLARY: 127 mg/dL — AB (ref 65–99)
Glucose-Capillary: 149 mg/dL — ABNORMAL HIGH (ref 65–99)

## 2015-06-30 LAB — URINALYSIS, ROUTINE W REFLEX MICROSCOPIC
Glucose, UA: NEGATIVE mg/dL
KETONES UR: 15 mg/dL — AB
NITRITE: POSITIVE — AB
Specific Gravity, Urine: 1.023 (ref 1.005–1.030)
pH: 5.5 (ref 5.0–8.0)

## 2015-06-30 LAB — URINE MICROSCOPIC-ADD ON

## 2015-06-30 LAB — AMMONIA: Ammonia: 25 umol/L (ref 9–35)

## 2015-06-30 MED ORDER — KETOROLAC TROMETHAMINE 15 MG/ML IJ SOLN: 15.0000 mg | Freq: Four times a day (QID) | INTRAMUSCULAR | Status: DC | PRN

## 2015-06-30 MED ORDER — DEXTROSE 5 % IV SOLN
100.0000 mg | Freq: Two times a day (BID) | INTRAVENOUS | Status: DC
  Administered 2015-06-30 – 2015-07-02 (×4): 100 mg via INTRAVENOUS
  Filled 2015-06-30 (×5): qty 100

## 2015-06-30 NOTE — Progress Notes (Signed)
At approx 1000, RN saw patient's color was pale and dusk and patient appeared to be sleeping and snoring. Patient was not easy to arouse- patient did open her eyes but was not alert. O2 Sats were noted to be in the 40s with 2L/[place]. NT had previously talked with patient about ordering her breakfast after she checked her AM CBG. Charge nurse and MD were notified immediately of decreased O2 Sats and LOC- patient did become more alert with increased oxygenation. Rapid response nurse also was notified. Patient was placed on 6L/[place] with O2 Sats in 80s- then simple mask at 12L with O2 Sats increased to 97%. STAT CXR, ABGs, and UA were ordered. Daughter was present at bedside. Patient became increasingly alert and back to baseline LOC. VS stable. MD followed closely. Patient A&O at this time.

## 2015-06-30 NOTE — Care Management Important Message (Signed)
Important Message  Patient Details  Name: Tamara Morales MRN: [id number] Date of Birth: 1931/04/04   Medicare Important Message Given:  Yes    Camillo Flaming 06/30/2015, 10:31 AMImportant Message  Patient Details  Name: Tamara Morales MRN: [id number] Date of Birth: [birth year]   Medicare Important Message Given:  Yes    Camillo Flaming 06/30/2015, 10:30 AM

## 2015-06-30 NOTE — Progress Notes (Signed)
2 Days Post-Op  Subjective:  1 - Rt Mid Ureteral Stone - 82mm right mid solitary stone with mild hydro by ER CT 06/26/14 on eval rt flank pain. Pain refracotry therefore underwent definitive management with right ureteroscopy / laser stent 1/9, stent removed 1/10 AM.   2 - Acute Renal Failure - Cr 1.8 on intake labs 06/26/14 up from baseline 1.2. Admits to NSAID use and poor hydration. Cr 1.1/10.   Today " Tamara Morales" is slowly improving. Having some trouble with hypercarbia with pain meds, though her pain requironment is decreasing.   Objective: Vital signs in last 24 hours: Temp:  [97.6 F (36.4 C)-99.2 F (37.3 C)] 97.6 F (36.4 C) (01/11 1300) Pulse Rate:  [80-106] 80 (01/11 1300) Resp:  [18-24] 19 (01/11 1300) BP: (96-157)/(53-82) 96/53 mmHg (01/11 1300) SpO2:  [82 %-100 %] 98 % (01/11 1300) Last BM Date: 06/27/15  Intake/Output from previous day: 01/10 0701 - 01/11 0700 In: 2330 [P.O.:480; I.V.:1800; IV Piggyback:50] Out: 650 [Urine:650] Intake/Output this shift: Total I/O In: 540 [P.O.:240; IV Piggyback:300] Out: 400 [Urine:400]  General appearance: alert, cooperative, appears stated age and family at bedside, pt at baseline, more alert today.  Head: Normocephalic, without obvious abnormality, atraumatic Eyes: negative Nose: Nares normal. Septum midline. Mucosa normal. No drainage or sinus tenderness. Throat: lips, mucosa, and tongue normal; teeth and gums normal Neck: supple, symmetrical, trachea midline Resp: mild tachypnea on [place] O2.  GI: soft, non-tender; bowel sounds normal; no masses,  no organomegaly and morbid obesity limts sensitivty of exam.  Female genitalia: No CVAT at present.  Extremities: extremities normal, atraumatic, no cyanosis or edema Incision/Wound: none  Lab Results:   Recent Labs  06/29/15 0430 06/30/15 0435  WBC 8.7 7.3  HGB 10.9* 11.4*  HCT 35.5* 37.1  PLT 135* 137*   BMET  Recent Labs  06/29/15 0430 06/30/15 0435  NA 140 140  K 3.9  4.2  CL 106 105  CO2 27 29  GLUCOSE 163* 113*  BUN 22* 16  CREATININE 1.12* 1.00  CALCIUM 7.4* 7.6*   PT/INR No results for input(s): LABPROT, INR in the last 72 hours. ABG  Recent Labs  06/30/15 1121  PHART 7.259*  HCO3 23.9    Studies/Results: Dg Chest Port 1 View  06/30/2015  CLINICAL DATA:  Shortness of breath, wheezing, altered mental status EXAM: PORTABLE CHEST 1 VIEW COMPARISON:  01/07/2015 FINDINGS: Evaluation is limited secondary to respiratory motion. There are patchy alveolar airspace opacities in the right midlung. There is mild bilateral interstitial thickening. There is no pleural effusion or pneumothorax. There is stable cardiomegaly. There is no acute osseous abnormality. The osseous structures are unremarkable. There are surgical clips in the right axilla. IMPRESSION: 1. Patchy right mid lung alveolar airspace opacities in bilateral interstitial thickening with cardiomegaly. Differential considerations include right mid lung pneumonia versus mild CHF. Electronically Signed   By: Kathreen Devoid   On: 06/30/2015 10:51    Anti-infectives: Anti-infectives    Start     Dose/Rate Route Frequency Ordered Stop   06/30/15 1200  doxycycline (VIBRAMYCIN) 100 mg in dextrose 5 % 250 mL IVPB     100 mg 125 mL/hr over 120 Minutes Intravenous Every 12 hours 06/30/15 1103     06/29/15 1500  cefTRIAXone (ROCEPHIN) 1 g in dextrose 5 % 50 mL IVPB     1 g 100 mL/hr over 30 Minutes Intravenous Every 24 hours 06/29/15 1347 07/06/15 1459   06/28/15 2100  [MAR Hold]  ceFAZolin (ANCEF)  IVPB 2 g/50 mL premix     (MAR Hold since 06/28/15 2032)   2 g 100 mL/hr over 30 Minutes Intravenous  Once 06/28/15 2025 06/28/15 2034      Assessment/Plan:  1 - Rt Mid Ureteral Stone - now stone free. OK for DC at anytime from GU perspective. We will call her with GU f/u appt in about 6 weeks.   Some level of resolving symptoms certainly expected, As Cr improved, may consider PO ketorolac to lessen  narcotic requironment.   2 - Acute Renal Failure - resolved.  Will follow prn at this poin, call with questions anytime.   [facility], Albaro Deviney 06/30/2015

## 2015-06-30 NOTE — Anesthesia Postprocedure Evaluation (Addendum)
Anesthesia Post Note  Patient: MARYSSA GIAMPIETRO  Procedure(s) Performed: Procedure(s) (LRB): CYSTOSCOPY RIGHT RETROGRAD RIGHT URETEROSCOPY/HOLMIUM LASER WITH RIGHT STENT PLACEMENT (Right)  Patient location during evaluation: PACU Anesthesia Type: General Level of consciousness: awake and alert Pain management: pain level controlled Vital Signs Assessment: post-procedure vital signs reviewed and stable Respiratory status: spontaneous breathing, nonlabored ventilation, respiratory function stable and patient connected to nasal cannula oxygen Cardiovascular status: blood pressure returned to baseline and stable Postop Assessment: no signs of nausea or vomiting Anesthetic complications: no    Last Vitals:  Filed Vitals:   06/29/15 2051 06/30/15 0950  BP: 157/74 154/82  Pulse: 106 98  Temp: 37.3 C 37.1 C  Resp: 18 24    Last Pain:  Filed Vitals:   06/30/15 1150  PainSc: Asleep                 Montez Hageman

## 2015-06-30 NOTE — Progress Notes (Addendum)
[facility] PROGRESS NOTE  Tamara Morales TFT:[id number] DOB: 1930-10-13 DOA: 06/27/2015 PCP: Garret Reddish, MD  Brief narrative 82 year old female with history of CK D ? Stage 2(baseline creatinine 1.15), chronic respiratory failure on 2 L O2 via nasal cannula at home hypertension who presented to the ED with 1 day history of right flank pain,/10 in severity without any aggravating or relieving factors and associated nausea 4. Intake. In the ED she received IV narcotics. CT renal study showed 5 mm partially obstructing right ureteral stone. Blood work showed mildly worsened renal function. Patient seen by urology and underwent cystoscopy with right retrograde pyelogram, right ureteroscopy with laser lithotripsy and insertion of right ureteral stent. Patient tolerated procedure well however on 1/11 became acutely encephalopathic and hypoxia with O2 sat in the 40s. Placed on face mask improved o2 sat. X-ray done showed questionable right middle lobe infiltrate with positive UA. ABG showed mild hypercapnia.    Assessment/Plan: Acute encephalopathy Possibly a combination of? Right middle lobe pneumonia, UTI, hypercapnia and secondary to narcotic (received tramadol around midnight and early this morning). Patient does have history of allergy to tramadol causing her to be jittery. Have discontinued narcotics. Broaden antibiotic to Rocephin and doxycycline  (was started on Rocephin yesterday for complaint of chills). Cultures not sent as she was already on antibiotics. -Continue neuro checks. Mentation started to improve by this afternoon.  Right ureteral stone with renal colic Appreciate urology consult. Underwent cystoscopy with right retrograde pyelogram and right ureteroscopy with laser lithotripsy and right ureteral stent placement. Avoid narcotics. Pain control with when necessary Toradol.  Acute on chronic kidney disease stage II Possibly associated with renal colic. Resolved with IV  fluids. Held all diuretics.  Anxiety and depression Hold off on Xanax and her encephalopathy.  Dyslipidemia Continue home medication  Essential hypertension Holding Lasix and torsemide due to renal dysfunction. Continue amlodipine.  Check breast cancer Stable.  DVT prophylaxis: SCDs Diet: Regular   Code Status: Full code Family Communication: Discussed with daughter at bedside  Disposition Plan: Continue inpatient monitoring. Home possibly tomorrow if mental status improves   Consultants:  Urology  Procedures:  CT renal study  Cystoscopy with right retrograde pyelogram and ureteroscopy with laser lithotripsy and right ureteral stent placement on 1/10  Antibiotics:  IV Rocephin and azithromycin  HPI/Subjective: Seen and examined. Rapid response was called as patient was lethargic and hypoxic to the 40s. Placed on face mask with improvement in O2 sat. Labs from this morning reviewed and stable. Chest x-ray, ABG, ammonia level, UA and EKG ordered.  Objective: Filed Vitals:   06/30/15 0950 06/30/15 1300  BP: 154/82 96/53  Pulse: 98 80  Temp: 98.7 F (37.1 C) 97.6 F (36.4 C)  Resp: 24 19    Intake/Output Summary (Last 24 hours) at 06/30/15 1616 Last data filed at 06/30/15 1117  Gross per 24 hour  Intake   2090 ml  Output    700 ml  Net   1390 ml   Filed Weights   06/27/15 1848 06/28/15 0619  Weight: 140.6 kg (309 lb 15.5 oz) 141 kg (310 lb 13.6 oz)    Exam:   General:  Elderly female lethargic, confused  HEENT: No pallor, moist mucosa  Cardiovascular: Normal S1 and S2, no murmurs  Respiratory: Clear Bilaterally, no added sounds  Abdomen: , Nondistended, nontender, bowel sounds present  Musculoskeletal: Warm, no edema  CNS: Awake and arousable, confused  Data Reviewed: Basic Metabolic Panel:  Recent Labs Lab 06/23/15 1914 06/27/15  1215 06/28/15 0446 06/29/15 0430 06/30/15 0435  NA 140 143 139 140 140  K 3.7 4.1 4.4 3.9 4.2  CL 101  105 105 106 105  CO2 $Re'28 27 26 27 29  'DKz$ GLUCOSE 94 104* 123* 163* 113*  BUN 11 23* 21* 22* 16  CREATININE 0.79 1.59* 1.31* 1.12* 1.00  CALCIUM 8.9 8.5* 7.9* 7.4* 7.6*   Liver Function Tests:  Recent Labs Lab 06/23/15 1914 06/27/15 1215 06/28/15 0446 06/29/15 0430 06/30/15 0435  AST 21 18 40 33 49*  ALT 17 17 33 28 32  ALKPHOS 85 79 90 79 88  BILITOT 0.7 0.5 0.7 0.6 0.6  PROT 7.2 6.4* 6.3* 5.7* 6.1*  ALBUMIN 4.1 3.1* 3.1* 2.8* 2.8*    Recent Labs Lab 06/27/15 1215  LIPASE 19    Recent Labs Lab 06/30/15 1115  AMMONIA 25   CBC:  Recent Labs Lab 06/23/15 1918 06/27/15 1215 06/28/15 0446 06/29/15 0430 06/30/15 0435  WBC 7.3 6.9 7.6 8.7 7.3  NEUTROABS  --   --   --  7.0 5.4  HGB 14.3 12.5 12.2 10.9* 11.4*  HCT 43.6 38.8 39.3 35.5* 37.1  MCV 86.1 88.6 92.0 93.4 93.7  PLT  --  150 147* 135* 137*   Cardiac Enzymes: No results for input(s): CKTOTAL, CKMB, CKMBINDEX, TROPONINI in the last 168 hours. BNP (last 3 results)  Recent Labs  11/03/14 0638 12/21/14 0940 01/07/15 1745  BNP 63.6 74.2 149.7*    ProBNP (last 3 results) No results for input(s): PROBNP in the last 8760 hours.  CBG:  Recent Labs Lab 06/28/15 0832 06/29/15 0730 06/30/15 0742 06/30/15 1018  GLUCAP 103* 113* 127* 149*    Recent Results (from the past 240 hour(s))  Surgical pcr screen     Status: None   Collection Time: 06/28/15  6:27 PM  Result Value Ref Range Status   MRSA, PCR NEGATIVE NEGATIVE Final   Staphylococcus aureus NEGATIVE NEGATIVE Final    Comment:        The Xpert SA Assay (FDA approved for NASAL specimens in patients over 47 years of age), is one component of a comprehensive surveillance program.  Test performance has been validated by [facility] for patients greater than or equal to 47 year old. It is not intended to diagnose infection nor to guide or monitor treatment.      Studies: Dg Chest Port 1 View  06/30/2015  CLINICAL DATA:  Shortness of  breath, wheezing, altered mental status EXAM: PORTABLE CHEST 1 VIEW COMPARISON:  01/07/2015 FINDINGS: Evaluation is limited secondary to respiratory motion. There are patchy alveolar airspace opacities in the right midlung. There is mild bilateral interstitial thickening. There is no pleural effusion or pneumothorax. There is stable cardiomegaly. There is no acute osseous abnormality. The osseous structures are unremarkable. There are surgical clips in the right axilla. IMPRESSION: 1. Patchy right mid lung alveolar airspace opacities in bilateral interstitial thickening with cardiomegaly. Differential considerations include right mid lung pneumonia versus mild CHF. Electronically Signed   By: Kathreen Devoid   On: 06/30/2015 10:51    Scheduled Meds: . amLODipine  5 mg Oral Daily   And  . irbesartan  150 mg Oral Daily  . aspirin EC  81 mg Oral Daily  . cefTRIAXone (ROCEPHIN)  IV  1 g Intravenous Q24H  . doxycycline (VIBRAMYCIN) IV  100 mg Intravenous Q12H  . levothyroxine  200 mcg Oral QAC breakfast  . nystatin   Topical BID  . omega-3 acid  ethyl esters  1 g Oral Daily  . pantoprazole  40 mg Oral Daily  . sodium chloride  3 mL Intravenous Q12H   Continuous Infusions: . sodium chloride 75 mL/hr at 06/30/15 0635  . lactated ringers 50 mL/hr at 06/28/15 2325      Time spent: 35 minutes    Louellen Molder  [facility] Pager [phone] If 7PM-7AM, please contact night-coverage at [email or web address], password [facility] 06/30/2015, 4:16 PM  LOS: 3 days

## 2015-07-01 DIAGNOSIS — F418 Other specified anxiety disorders: Secondary | ICD-10-CM

## 2015-07-01 DIAGNOSIS — R319 Hematuria, unspecified: Secondary | ICD-10-CM

## 2015-07-01 DIAGNOSIS — E662 Morbid (severe) obesity with alveolar hypoventilation: Secondary | ICD-10-CM

## 2015-07-01 LAB — GLUCOSE, CAPILLARY: GLUCOSE-CAPILLARY: 120 mg/dL — AB (ref 65–99)

## 2015-07-01 MED ORDER — FLUCONAZOLE 100 MG PO TABS
100.0000 mg | ORAL_TABLET | Freq: Every day | ORAL | Status: DC
  Administered 2015-07-01 – 2015-07-02 (×2): 100 mg via ORAL
  Filled 2015-07-01 (×2): qty 1

## 2015-07-01 MED ORDER — LEVALBUTEROL HCL 0.63 MG/3ML IN NEBU
0.6300 mg | INHALATION_SOLUTION | Freq: Four times a day (QID) | RESPIRATORY_TRACT | Status: DC | PRN
  Administered 2015-07-01 – 2015-07-02 (×2): 0.63 mg via RESPIRATORY_TRACT
  Filled 2015-07-01 (×2): qty 3

## 2015-07-01 MED ORDER — LEVALBUTEROL HCL 1.25 MG/0.5ML IN NEBU
1.2500 mg | INHALATION_SOLUTION | Freq: Once | RESPIRATORY_TRACT | Status: DC
  Filled 2015-07-01: qty 0.5

## 2015-07-01 MED ORDER — FUROSEMIDE 10 MG/ML IJ SOLN
40.0000 mg | Freq: Once | INTRAMUSCULAR | Status: AC
  Administered 2015-07-01: 40 mg via INTRAVENOUS
  Filled 2015-07-01: qty 4

## 2015-07-01 MED ORDER — SENNA 8.6 MG PO TABS
1.0000 | ORAL_TABLET | Freq: Every day | ORAL | Status: DC
  Administered 2015-07-01 – 2015-07-02 (×2): 8.6 mg via ORAL
  Filled 2015-07-01 (×2): qty 1

## 2015-07-01 NOTE — Progress Notes (Signed)
Physical Therapy Treatment Patient Details Name: Tamara Morales MRN: [id number] DOB: 04-Aug-1930 Today's Date: 07/01/2015    History of Present Illness 82 yo female admitted with abdominal pain. S/P cystoscopy, R ureteral stent 1/9. Hx of CHF, OSA, HTN, breast cancer. Pt is from [facility]    PT Comments    Progressing slowly with mobility. Fatigues quickly with activity. Pt c/o bil LE pain on today. Recommend SNf.   Follow Up Recommendations  SNF     Equipment Recommendations  None recommended by PT    Recommendations for Other Services       Precautions / Restrictions Precautions Precautions: Fall Restrictions Weight Bearing Restrictions: No    Mobility  Bed Mobility Overal bed mobility: Needs Assistance Bed Mobility: Supine to Sit     Supine to sit: Mod assist;HOB elevated     General bed mobility comments: Assist for trunk and bil LEs. Increased time. Moderate reliance on bedrail  Transfers Overall transfer level: Needs assistance Equipment used: Rolling walker (2 wheeled) Transfers: Sit to/from Stand Sit to Stand: Mod assist         General transfer comment: x2. (x1 from elevated bed, x1 from bsc). assist to rise, stabilize, control descent. Stand pivot from bed to [facility] with RW. Increased time.   Ambulation/Gait Ambulation/Gait assistance: Min guard;+2 safety/equipment Ambulation Distance (Feet): 15 Feet (x2) Assistive device: Rolling walker (2 wheeled) Gait Pattern/deviations: Step-through pattern;Decreased stride length;Trunk flexed     General Gait Details: Assist to stabilze and maneuver safely with walker. 1 seated rest break. Fatigues quickly. Followed closely with recliner. Remained on 3L [place] O2-93%.   Stairs            Wheelchair Mobility    Modified Rankin (Stroke Patients Only)       Balance                                    Cognition Arousal/Alertness: Awake/alert Behavior During Therapy: WFL for tasks  assessed/performed Overall Cognitive Status: Within Functional Limits for tasks assessed                      Exercises      General Comments        Pertinent Vitals/Pain Pain Assessment: Faces Faces Pain Scale: Hurts even more Pain Location: bil LEs (R worse than L) Pain Descriptors / Indicators: Sore;Aching Pain Intervention(s): Monitored during session;Repositioned    Home Living                      Prior Function            PT Goals (current goals can now be found in the care plan section) Progress towards PT goals: Progressing toward goals    Frequency  Min 3X/week    PT Plan Current plan remains appropriate    Co-evaluation             End of Session Equipment Utilized During Treatment: Oxygen Activity Tolerance: Patient limited by fatigue Patient left: in chair;with call bell/phone within reach;with family/visitor present;with chair alarm set     Time: 1131-1155 PT Time Calculation (min) (ACUTE ONLY): 24 min  Charges:  $Gait Training: 8-22 mins $Therapeutic Activity: 8-22 mins                    G Codes:       Weston Anna, MPT  Pager: [phone]

## 2015-07-01 NOTE — Progress Notes (Signed)
Pt desat to 83-84 on 6L nasal cannula, asked if she is breathing out of her mouth. Pt reports she usually wears CPAP machine at night. Respiratory paged to give a heads up, on-call paged to see about CPAP order placement. Will continue to monitor.

## 2015-07-01 NOTE — Progress Notes (Addendum)
Pt reported difficulty breathing on 6L [place], SPO2 at 92-93%, respiratory paged and placed pt venti mask at 10L on 45%, SPO2 at 95-97%. K.Shorr paged, prn nebulizer treatment ordered. Paged respiratory to administer breathing treatment, will continue to monitor and reassess.

## 2015-07-01 NOTE — Progress Notes (Signed)
RT assisted patient with CPAP. Oxygen 5L bled in. Pt comfortable. Understands to call with questions/concerns

## 2015-07-01 NOTE — Progress Notes (Signed)
[facility] PROGRESS NOTE  Tamara Morales ZOX:[id number] DOB: August 01, 1930 DOA: 06/27/2015 PCP: Tamara Conch, MD  Brief narrative 82 year old female with history of CK D ? Stage 2(baseline creatinine 1.15), chronic respiratory failure on 2 L O2 via nasal cannula at home hypertension who presented to the ED with 1 day history of right flank pain,/10 in severity without any aggravating or relieving factors and associated nausea 4. Intake. In the ED she received IV narcotics. CT renal study showed 5 mm partially obstructing right ureteral stone. Blood work showed mildly worsened renal function. Patient seen by urology and underwent cystoscopy with right retrograde pyelogram, right ureteroscopy with laser lithotripsy and insertion of right ureteral stent. Patient tolerated procedure well however on 1/11 became acutely encephalopathic and hypoxia with O2 sat in the 40s. Placed on face mask improved o2 sat. X-ray done showed questionable right middle lobe infiltrate with positive UA. ABG showed mild hypercapnia.    Assessment/Plan: Acute encephalopathy with acute on chronic hypoxic respiratory failure on 1/11 Possibly a combination of? Right middle lobe pneumonia, UTI, hypercapnia and secondary to narcotic (received tramadol around midnight and early this morning). Patient does have history of allergy to tramadol causing her to be jittery. - discontinued narcotics. Broaden antibiotic to Rocephin and doxycycline   -Mental status has returned to baseline. Patient had an episode of hypoxia overnight O2 sat dropped to 6 L. Improved after placed on CPAP (patient is on CPAP at home). Maintaining O2 sat at 2 L this morning.   Right ureteral stone with renal colic Appreciate urology consult. Underwent cystoscopy with right retrograde pyelogram and right ureteroscopy with laser lithotripsy and right ureteral stent placement. Avoid narcotics. Pain control with when necessary Toradol. Complains of some  hematuria which is expected. H&H stable.  Acute on chronic kidney disease stage II Possibly associated with renal colic. Resolved with IV fluids. Held all diuretics.  Anxiety and depression Hold off on Xanax and her encephalopathy.  Dyslipidemia Continue home medication  Essential hypertension Holding Lasix and torsemide due to renal dysfunction. Continue amlodipine.  Check breast cancer Stable.  DVT prophylaxis: SCDs Diet: Regular   Code Status: Full code Family Communication: Discussed with daughter at bedside  Disposition Plan: Discharge to skilled nursing facility tomorrow if remains stable.   Consultants:  Urology  Procedures:  CT renal study  Cystoscopy with right retrograde pyelogram and ureteroscopy with laser lithotripsy and right ureteral stent placement on 1/10  Antibiotics:  IV Rocephin and doxycycline 1/11  HPI/Subjective: Seen and examined. Patient oriented at baseline.  reportedly overnight had to be placed on 6 L via nasal cannula and still desaturated and then placed on Ventimask. Reports feeling weak. Also complains of some hematuria. Objective: Filed Vitals:   07/01/15 0603 07/01/15 1315  BP: 135/67 134/69  Pulse: 93 90  Temp: 98.6 F (37 C) 98.2 F (36.8 C)  Resp: 18 18    Intake/Output Summary (Last 24 hours) at 07/01/15 1357 Last data filed at 07/01/15 1100  Gross per 24 hour  Intake   1030 ml  Output   1300 ml  Net   -270 ml   Filed Weights   06/27/15 1848 06/28/15 0619 07/01/15 0603  Weight: 140.6 kg (309 lb 15.5 oz) 141 kg (310 lb 13.6 oz) 145.6 kg (320 lb 15.8 oz)    Exam:   General:  Elderly obese female not in distress  HEENT: moist mucosa  Cardiovascular: Normal S1 and S2, no murmurs  Respiratory: Clear Bilaterally, no added sounds  Abdomen: , Nondistended, nontender, bowel sounds present  Musculoskeletal: Warm, no edema  CNS: Alert and oriented  Data Reviewed: Basic Metabolic Panel:  Recent Labs Lab  06/27/15 1215 06/28/15 0446 06/29/15 0430 06/30/15 0435  NA 143 139 140 140  K 4.1 4.4 3.9 4.2  CL 105 105 106 105  CO2 27 26 27 29   GLUCOSE 104* 123* 163* 113*  BUN 23* 21* 22* 16  CREATININE 1.59* 1.31* 1.12* 1.00  CALCIUM 8.5* 7.9* 7.4* 7.6*   Liver Function Tests:  Recent Labs Lab 06/27/15 1215 06/28/15 0446 06/29/15 0430 06/30/15 0435  AST 18 40 33 49*  ALT 17 33 28 32  ALKPHOS 79 90 79 88  BILITOT 0.5 0.7 0.6 0.6  PROT 6.4* 6.3* 5.7* 6.1*  ALBUMIN 3.1* 3.1* 2.8* 2.8*    Recent Labs Lab 06/27/15 1215  LIPASE 19    Recent Labs Lab 06/30/15 1115  AMMONIA 25   CBC:  Recent Labs Lab 06/27/15 1215 06/28/15 0446 06/29/15 0430 06/30/15 0435  WBC 6.9 7.6 8.7 7.3  NEUTROABS  --   --  7.0 5.4  HGB 12.5 12.2 10.9* 11.4*  HCT 38.8 39.3 35.5* 37.1  MCV 88.6 92.0 93.4 93.7  PLT 150 147* 135* 137*   Cardiac Enzymes: No results for input(s): CKTOTAL, CKMB, CKMBINDEX, TROPONINI in the last 168 hours. BNP (last 3 results)  Recent Labs  11/03/14 0638 12/21/14 0940 01/07/15 1745  BNP 63.6 74.2 149.7*    ProBNP (last 3 results) No results for input(s): PROBNP in the last 8760 hours.  CBG:  Recent Labs Lab 06/28/15 0832 06/29/15 0730 06/30/15 0742 06/30/15 1018 07/01/15 0828  GLUCAP 103* 113* 127* 149* 120*    Recent Results (from the past 240 hour(s))  Surgical pcr screen     Status: None   Collection Time: 06/28/15  6:27 PM  Result Value Ref Range Status   MRSA, PCR NEGATIVE NEGATIVE Final   Staphylococcus aureus NEGATIVE NEGATIVE Final    Comment:        The Xpert SA Assay (FDA approved for NASAL specimens in patients over 62 years of age), is one component of a comprehensive surveillance program.  Test performance has been validated by [facility] for patients greater than or equal to 65 year old. It is not intended to diagnose infection nor to guide or monitor treatment.      Studies: Dg Chest Port 1 View  06/30/2015   CLINICAL DATA:  Shortness of breath, wheezing, altered mental status EXAM: PORTABLE CHEST 1 VIEW COMPARISON:  01/07/2015 FINDINGS: Evaluation is limited secondary to respiratory motion. There are patchy alveolar airspace opacities in the right midlung. There is mild bilateral interstitial thickening. There is no pleural effusion or pneumothorax. There is stable cardiomegaly. There is no acute osseous abnormality. The osseous structures are unremarkable. There are surgical clips in the right axilla. IMPRESSION: 1. Patchy right mid lung alveolar airspace opacities in bilateral interstitial thickening with cardiomegaly. Differential considerations include right mid lung pneumonia versus mild CHF. Electronically Signed   By: Elige Ko   On: 06/30/2015 10:51    Scheduled Meds: . amLODipine  5 mg Oral Daily   And  . irbesartan  150 mg Oral Daily  . aspirin EC  81 mg Oral Daily  . cefTRIAXone (ROCEPHIN)  IV  1 g Intravenous Q24H  . doxycycline (VIBRAMYCIN) IV  100 mg Intravenous Q12H  . fluconazole  100 mg Oral Daily  . levalbuterol  1.25 mg Nebulization Once  .  levothyroxine  200 mcg Oral QAC breakfast  . nystatin   Topical BID  . omega-3 acid ethyl esters  1 g Oral Daily  . pantoprazole  40 mg Oral Daily  . senna  1 tablet Oral Daily  . sodium chloride  3 mL Intravenous Q12H   Continuous Infusions: . lactated ringers 50 mL/hr at 06/28/15 2325      Time spent:25 minutes    Tamara Morales  [facility] Pager [phone] If 7PM-7AM, please contact night-coverage at [email or web address], password [email or web address] 07/01/2015, 1:57 PM  LOS: 4 days

## 2015-07-01 NOTE — Progress Notes (Signed)
RT placed cpap on patient. Patient setting is auto 5-18 cmH2O. Sterile water was added to water chamber for humidification. 6 liters oxygen is bleed into tubing. Patient seemd to be tolerating well. RT will continue to monitor,

## 2015-07-01 NOTE — Progress Notes (Signed)
Pt SPO2 at 97% on venturi mask, requested to be placed back on nasal cannula. Placed nasal cannula back to 6L, SPO2 at 95%. Will continue to monitor.

## 2015-07-01 NOTE — Progress Notes (Signed)
RN called RT about patient being unable to breath well.RT placed patient on a venti mask at 45%. Patient stated she felt like she could not breath. RT asked RN to call MD to get some PRN breathing treatments. RT will continue to monitor.

## 2015-07-01 NOTE — Care Management Note (Signed)
Case Management Note  Patient Details  Name: ALEKSANDRA RABEN MRN: [id number] Date of Birth: [birth year]  Subjective/Objective: Resp failure. Hypoxia.PT-recc SNF.                   Action/Plan:d/c plan SNF.   Expected Discharge Date:                  Expected Discharge Plan:  Skilled Nursing Facility  In-House Referral:  Clinical Social Work  Discharge planning Services  CM Consult  Post Acute Care Choice:    Choice offered to:     DME Arranged:    DME Agency:     HH Arranged:    [place] Agency:     Status of Service:  In process, will continue to follow  Medicare Important Message Given:  Yes Date Medicare IM Given:    Medicare IM give by:    Date Additional Medicare IM Given:    Additional Medicare Important Message give by:     If discussed at [place] of Stay Meetings, dates discussed:    Additional Comments:  Dessa Phi, RN 07/01/2015, 3:47 PM

## 2015-07-02 ENCOUNTER — Encounter: Payer: Self-pay | Admitting: Occupational Therapy

## 2015-07-02 ENCOUNTER — Inpatient Hospital Stay (HOSPITAL_COMMUNITY): Payer: Medicare Other

## 2015-07-02 DIAGNOSIS — N132 Hydronephrosis with renal and ureteral calculous obstruction: Secondary | ICD-10-CM | POA: Diagnosis not present

## 2015-07-02 DIAGNOSIS — F418 Other specified anxiety disorders: Secondary | ICD-10-CM | POA: Diagnosis not present

## 2015-07-02 DIAGNOSIS — R0602 Shortness of breath: Secondary | ICD-10-CM | POA: Diagnosis not present

## 2015-07-02 DIAGNOSIS — R1031 Right lower quadrant pain: Secondary | ICD-10-CM | POA: Diagnosis not present

## 2015-07-02 DIAGNOSIS — R2689 Other abnormalities of gait and mobility: Secondary | ICD-10-CM | POA: Diagnosis not present

## 2015-07-02 DIAGNOSIS — J181 Lobar pneumonia, unspecified organism: Secondary | ICD-10-CM | POA: Diagnosis not present

## 2015-07-02 DIAGNOSIS — R109 Unspecified abdominal pain: Secondary | ICD-10-CM | POA: Diagnosis not present

## 2015-07-02 DIAGNOSIS — M6281 Muscle weakness (generalized): Secondary | ICD-10-CM | POA: Diagnosis not present

## 2015-07-02 DIAGNOSIS — I890 Lymphedema, not elsewhere classified: Secondary | ICD-10-CM

## 2015-07-02 DIAGNOSIS — N390 Urinary tract infection, site not specified: Secondary | ICD-10-CM | POA: Diagnosis not present

## 2015-07-02 DIAGNOSIS — N184 Chronic kidney disease, stage 4 (severe): Secondary | ICD-10-CM

## 2015-07-02 DIAGNOSIS — E039 Hypothyroidism, unspecified: Secondary | ICD-10-CM | POA: Diagnosis not present

## 2015-07-02 DIAGNOSIS — I10 Essential (primary) hypertension: Secondary | ICD-10-CM | POA: Diagnosis not present

## 2015-07-02 DIAGNOSIS — R4189 Other symptoms and signs involving cognitive functions and awareness: Secondary | ICD-10-CM | POA: Diagnosis not present

## 2015-07-02 DIAGNOSIS — N201 Calculus of ureter: Secondary | ICD-10-CM | POA: Diagnosis not present

## 2015-07-02 DIAGNOSIS — G9340 Encephalopathy, unspecified: Secondary | ICD-10-CM | POA: Insufficient documentation

## 2015-07-02 DIAGNOSIS — M25551 Pain in right hip: Secondary | ICD-10-CM | POA: Diagnosis not present

## 2015-07-02 DIAGNOSIS — N179 Acute kidney failure, unspecified: Secondary | ICD-10-CM | POA: Diagnosis not present

## 2015-07-02 DIAGNOSIS — K219 Gastro-esophageal reflux disease without esophagitis: Secondary | ICD-10-CM | POA: Diagnosis not present

## 2015-07-02 DIAGNOSIS — R29898 Other symptoms and signs involving the musculoskeletal system: Secondary | ICD-10-CM | POA: Diagnosis not present

## 2015-07-02 DIAGNOSIS — R2681 Unsteadiness on feet: Secondary | ICD-10-CM | POA: Diagnosis not present

## 2015-07-02 LAB — GLUCOSE, CAPILLARY: GLUCOSE-CAPILLARY: 116 mg/dL — AB (ref 65–99)

## 2015-07-02 MED ORDER — AMOXICILLIN-POT CLAVULANATE 875-125 MG PO TABS: 1.0000 | ORAL_TABLET | Freq: Two times a day (BID) | ORAL | Status: AC

## 2015-07-02 MED ORDER — FUROSEMIDE 10 MG/ML IJ SOLN
40.0000 mg | Freq: Once | INTRAMUSCULAR | Status: AC
  Administered 2015-07-02: 40 mg via INTRAVENOUS
  Filled 2015-07-02: qty 4

## 2015-07-02 MED ORDER — FLUCONAZOLE 100 MG PO TABS: 100.0000 mg | ORAL_TABLET | Freq: Every day | ORAL | Status: AC

## 2015-07-02 MED ORDER — SALINE SPRAY 0.65 % NA SOLN
1.0000 | NASAL | Status: DC | PRN
  Administered 2015-07-02: 1 via NASAL
  Filled 2015-07-02: qty 44

## 2015-07-02 NOTE — Care Management Note (Signed)
Case Management Note  Patient Details  Name: MAUDY YONAN MRN: [id number] Date of Birth: 03/23/1931  Subjective/Objective:                    Action/Plan:d/c SNF.   Expected Discharge Date:                  Expected Discharge Plan:  Skilled Nursing Facility  In-House Referral:  Clinical Social Work  Discharge planning Services  CM Consult  Post Acute Care Choice:    Choice offered to:     DME Arranged:    DME Agency:     HH Arranged:    [place] Agency:     Status of Service:  Completed, signed off  Medicare Important Message Given:  Yes Date Medicare IM Given:    Medicare IM give by:    Date Additional Medicare IM Given:    Additional Medicare Important Message give by:     If discussed at [place] of Stay Meetings, dates discussed:    Additional Comments:  Dessa Phi, RN 07/02/2015, 11:17 AM

## 2015-07-02 NOTE — Significant Event (Signed)
Rapid Response Event Note  Overview: Time Called: 0205 Arrival Time: 0215 Event Type: Respiratory  Initial Focused Assessment:Patient alert and oriented to person and place. She states she can not breathe. Also states she woke up and did not know where she was. States she has never seen this place before (has been in the same room since 1/8). She expresses concern that she has had a stroke. She was on CPAP and is now on NRB with sats 94%. Lungs diminished with wheezes, congested cough.   Interventions:Neuro assessment:MAE, follows commands, no droop, PEARL, --Resp. Treatment, CXR,   Event Summary: Name of Physician Notified: Ruthell Rummage NP at 0220    at    Outcome: Stayed in room and stabalized  Event End Time: 0320  Pricilla Riffle

## 2015-07-02 NOTE — Discharge Summary (Signed)
Physician Discharge Summary  Tamara Morales [facility]:[id number] DOB: 1931-03-15 DOA: 06/27/2015  PCP: Garret Reddish, MD  Admit date: 06/27/2015 Discharge date: 07/02/2015  Time spent: 35 minutes  Recommendations for Outpatient Follow-up:  Discharged to skilled nursing facility. Patient will complete antibiotic course (Augmentin) on 07/06/2015. Patient should follow-up with urologist Dr. Tresa Moore in 6 weeks  Discharge Diagnoses:  Principal Problem:   Ureteral stone with hydronephrosis   Active Problems:   Hypothyroidism   Morbid obesity ([place])   Essential hypertension   Obesity hypoventilation syndrome (HCC)   Acute renal failure superimposed on stage 4 chronic kidney disease (HCC)   Right ureteral calculus   Anxiety and depression   History of breast cancer   Abdominal pain, RLQ (right lower quadrant)   Lobar pneumonia (HCC)   Lymphedema of both lower extremities   UTI (urinary tract infection)   Discharge Condition: Fair  Diet recommendation: Low sodium  Filed Weights   06/28/15 0619 07/01/15 0603 07/02/15 0500  Weight: 141 kg (310 lb 13.6 oz) 145.6 kg (320 lb 15.8 oz) 145.6 kg (320 lb 15.8 oz)    History of present illness:  Please refer to admission H&P for details, in brief,82 year old female with history of CK D ? Stage 2(baseline creatinine 1.15), chronic respiratory failure on 2 L O2 via nasal cannula at home hypertension who presented to the ED with 1 day history of right flank pain,/10 in severity without any aggravating or relieving factors and associated nausea 4. Intake. In the ED she received IV narcotics. CT renal study showed 5 mm partially obstructing right ureteral stone. Blood work showed mildly worsened renal function. Patient seen by urology and underwent cystoscopy with right retrograde pyelogram, right ureteroscopy with laser lithotripsy and insertion of right ureteral stent. Patient tolerated procedure well however on 1/11 became acutely encephalopathic and  hypoxia with O2 sat in the 40s. Placed on face mask improved o2 sat. X-ray done showed questionable right middle lobe infiltrate with positive UA. ABG showed mild hypercapnia.  Hospital Course:  Acute encephalopathy with acute on chronic hypoxic respiratory failure on 1/11 Possibly a combination of? Right middle lobe pneumonia, UTI, hypercapnia and secondary to narcotic (received tramadol around midnight and early this morning). Patient does have history of allergy to tramadol causing her to be jittery. - discontinued narcotics. Broaden antibiotic to Rocephin and doxycycline  . Maintaining O2 sat at 2 L this morning. -On 1/12 overnight patient had acute encephalopathy when she called the nurse after she awoke confused and did not know where she was. She requested her CPAP to be removed stating she could not breathe. She was concerned that she had a stroke and requested to see the M.D. It response was called and told coverage and be evaluated her at bedside. She was placed on an RB briefly and was found to have expiratory wheezes. After detailed history and exam it appeared that she was having episode of sundowning and did not have strokelike symptom. -Patient is at her normal mental status this morning. No sign or symptom of active infection. -I will discharge her on oral Augmentin for 5 more days to complete a total 7 day course of antibiotic. Patient has received intermittent IV Lasix for her dyspnea. I will resume her Demadex and HCTZ upon discharge. She should on continuous home O2 (2 L) and on nighttime CPAP.    Right ureteral stone with renal colic Appreciate urology consult. Underwent cystoscopy with right retrograde pyelogram and right ureteroscopy with laser lithotripsy and right  ureteral stent placement. Avoid narcotics. Does not have further pain. Complains of some hematuria which is expected. H&H stable. -She should follow-up with Dr. Tresa Moore in 6 weeks.  Acute on chronic kidney disease  stage II Possibly associated with renal colic. Resolved with IV fluids. Held all diuretics while in the hospital. Resume upon discharge.  Anxiety and depression Xanax was held due to her encephalopathy. Can be resumed upon discharge.  Dyslipidemia Continue home medication  Essential hypertension Resume home blood pressure medications.  Check breast cancer Stable.  Hypothyroidism Continue Synthroid  Generalized weakness Seen by physical therapy and recommended skilled nursing facility.  Code Status: Full code Family Communication: None at bedside Disposition Plan: Discharge to skilled nursing facility   Consultants:  Urology  Procedures:  CT renal study  Cystoscopy with right retrograde pyelogram and ureteroscopy with laser lithotripsy and right ureteral stent placement on 1/10  Antibiotics:  IV Rocephin and doxycycline 1/11  Augmentin 11/13-11/17  Discharge Exam: Filed Vitals:   07/02/15 0315 07/02/15 0407  BP: 126/66 142/49  Pulse:  90  Temp:  98.1 F (36.7 C)  Resp: 20 18     General: Elderly obese female not in distress  HEENT: moist mucosa  Cardiovascular: Normal S1 and S2, no murmurs  Respiratory: Clear Bilaterally, no added sounds  Abdomen: , Nondistended, nontender, bowel sounds present  Musculoskeletal: Warm, chronic lymphedema  CNS: Alert and oriented  Discharge Instructions    Current Discharge Medication List    START taking these medications   Details  amoxicillin-clavulanate (AUGMENTIN) 875-125 MG tablet Take 1 tablet by mouth 2 (two) times daily. Qty: 10 tablet, Refills: 0      CONTINUE these medications which have CHANGED   Details  fluconazole (DIFLUCAN) 100 MG tablet Take 1 tablet (100 mg total) by mouth daily. Qty: 5 tablet, Refills: 0      CONTINUE these medications which have NOT CHANGED   Details  ALPRAZolam (XANAX) 0.25 MG tablet TAKE 1/2 TO 1 TABLET BY MOUTH TWICE A DAY AS NEEDED FOR ANXIETY OR SLEEP Qty:  40 tablet, Refills: 4    amLODipine-valsartan (EXFORGE) 5-160 MG tablet Take 1 tablet by mouth daily. Qty: 90 tablet, Refills: 1    aspirin 81 MG tablet Take 81 mg by mouth daily.     cetirizine (ZYRTEC) 10 MG tablet Take 10 mg by mouth daily as needed for allergies.     clotrimazole (LOTRIMIN) 1 % cream Apply 1 application topically 2 (two) times daily. As needed for fungal skin infections Qty: 113 g, Refills: 1    FIBER PO Take 2 tablets by mouth every morning.     fluticasone (FLONASE) 50 MCG/ACT nasal spray Place 2 sprays into both nostrils daily. Qty: 16 g, Refills: 2    hydrochlorothiazide (HYDRODIURIL) 12.5 MG tablet Take 1 tablet (12.5 mg total) by mouth daily. Qty: 90 tablet, Refills: 1    isometheptene-acetaminophen-dichloralphenazone (MIDRIN) 65-100-325 MG capsule Take 1 capsule by mouth daily as needed for migraine. Qty: 30 capsule, Refills: 0    nystatin (MYCOSTATIN/NYSTOP) 100000 UNIT/GM POWD APPLY TO AFFECTED AREA TWICE A DAY Qty: 120 g, Refills: 2    Omega-3 Fatty Acids (FISH OIL) 1200 MG CAPS Take 2,400 mg by mouth daily.     omeprazole (PRILOSEC) 20 MG capsule TAKE 1 CAPSULE (20 MG TOTAL) BY MOUTH 2 (TWO) TIMES DAILY. Qty: 180 capsule, Refills: 3    potassium chloride (MICRO-K) 10 MEQ CR capsule Take 4 capsules (40 mEq total) by mouth daily. Qty: 120  capsule, Refills: 6    SYNTHROID 200 MCG tablet Take 1 tablet (200 mcg total) by mouth daily before breakfast. Qty: 90 tablet, Refills: 3    torsemide (DEMADEX) 20 MG tablet Take 1 tablet (20 mg total) by mouth daily. Qty: 30 tablet, Refills: 6      STOP taking these medications     loratadine (CLARITIN) 10 MG tablet      furosemide (LASIX) 40 MG tablet      traMADol (ULTRAM) 50 MG tablet        Allergies  Allergen Reactions  . Azithromycin Itching  . Beta Adrenergic Blockers     Depression   . Ciprofloxacin Other (See Comments)    REACTION: edgy and very jumpy   . Codeine Nausea And Vomiting   . Epinephrine Other (See Comments)    REACTION: rapid pulse, sweats  . Klonopin [Clonazepam] Other (See Comments)    Makes her feel very suicidal   . Oxycodone Other (See Comments)    Patient felt like it altered her mental status "Crazy" . Hallucinations later as well.  [name] Paxil [Paroxetine Hydrochloride] Other (See Comments)    Sever depression   . Prednisone     "makes me feel really bad"  . Propoxyphene Hcl Nausea And Vomiting  . Tramadol Other (See Comments)    Feels "jittery"  . Meloxicam Diarrhea  . Vancomycin Rash    Localized rash related to infusion rate.   Follow-up Information    Follow up with Garret Reddish, MD. Schedule an appointment as soon as possible for a visit in 1 week.   Specialty:  Family Medicine   Contact information:   [place] [address] [phone]       Follow up with Alexis Frock, MD. Call in 6 weeks.   Specialty:  Urology   Contact information:   [place] [address] [phone]        The results of significant diagnostics from this hospitalization (including imaging, microbiology, ancillary and laboratory) are listed below for reference.    Significant Diagnostic Studies: Dg Lumbar Spine Complete  06/23/2015  CLINICAL DATA:  Right-sided pain.  Low back pain. EXAM: LUMBAR SPINE - COMPLETE 4+ VIEW COMPARISON:  CT 08/26/2013 FINDINGS: Normal alignment of lumbar vertebral bodies. There is multiple levels of endplate spurring and joint space narrowing. Levoscoliosis of the spine. IMPRESSION: No acute findings of the spine. Multiple level degenerate change not different from comparison CT. Electronically Signed   By: Suzy Bouchard M.D.   On: 06/23/2015 20:28   Dg Pelvis 1-2 Views  06/23/2015  CLINICAL DATA:  Pelvic pain, no known injury, initial encounter EXAM: PELVIS - 1-2 VIEW COMPARISON:  None. FINDINGS: Pelvic ring is intact. No acute fracture or dislocation is noted. No gross soft tissue  abnormality is seen. IMPRESSION: No acute abnormality noted. Electronically Signed   By: Inez Catalina M.D.   On: 06/23/2015 20:28   US Venous Img Lower Unilateral Right  06/23/2015  CLINICAL DATA:  Right lower extremity pain for 3 days. EXAM: RIGHT LOWER EXTREMITY VENOUS DOPPLER ULTRASOUND TECHNIQUE: Gray-scale sonography with graded compression, as well as color Doppler and duplex ultrasound, were performed to evaluate the deep venous system from the level of the common femoral vein through the popliteal and proximal calf veins. Spectral Doppler was utilized to evaluate flow at rest and with distal augmentation maneuvers. COMPARISON:  None. FINDINGS: Left common femoral vein is patent without thrombus. Normal compressibility, augmentation and  color Doppler flow in the right common femoral vein, right femoral vein and right popliteal vein. The right saphenofemoral junction is patent. Right profunda femoral vein is patent without thrombus. Limited evaluation of the right deep calf veins. IMPRESSION: Negative for deep vein thrombosis in right lower extremity. Limits evaluation of the right calf. Electronically Signed   By: Markus Daft M.D.   On: 06/23/2015 23:14   Dg Chest Port 1 View  07/02/2015  CLINICAL DATA:  Respiratory distress, shortness of breath. EXAM: PORTABLE CHEST 1 VIEW COMPARISON:  Radiograph 2 days prior 06/30/2015 FINDINGS: Cardiomegaly with tortuous thoracic aorta, unchanged from prior exam. Progressive interstitial opacities, concerning for pulmonary edema. The more patchy opacities in the right midlung zone have diminished in the interim. No large pleural effusion on this portable AP view. No pneumothorax. IMPRESSION: Cardiomegaly. Diffuse interstitial opacities most concerning for interstitial edema, progressed from prior exam. Electronically Signed   By: Jeb Levering M.D.   On: 07/02/2015 03:41   Dg Chest Port 1 View  06/30/2015  CLINICAL DATA:  Shortness of breath, wheezing, altered  mental status EXAM: PORTABLE CHEST 1 VIEW COMPARISON:  01/07/2015 FINDINGS: Evaluation is limited secondary to respiratory motion. There are patchy alveolar airspace opacities in the right midlung. There is mild bilateral interstitial thickening. There is no pleural effusion or pneumothorax. There is stable cardiomegaly. There is no acute osseous abnormality. The osseous structures are unremarkable. There are surgical clips in the right axilla. IMPRESSION: 1. Patchy right mid lung alveolar airspace opacities in bilateral interstitial thickening with cardiomegaly. Differential considerations include right mid lung pneumonia versus mild CHF. Electronically Signed   By: Kathreen Devoid   On: 06/30/2015 10:51   Ct Renal Stone Study  06/27/2015  CLINICAL DATA:  Abdominal pain EXAM: CT ABDOMEN AND PELVIS WITHOUT CONTRAST TECHNIQUE: Multidetector CT imaging of the abdomen and pelvis was performed following the standard protocol without IV contrast. COMPARISON:  08/26/2013 FINDINGS: Lower chest: The lung bases are clear. No pleural or pericardial effusion. Hepatobiliary: No suspicious liver abnormality. Previous cholecystectomy. No biliary dilatation. Pancreas: Unremarkable appearance of the pancreas. Spleen: Again noted is a low-attenuation structure within the spleen measuring 2.9 cm. This likely represents a benign abnormality. Adrenals/Urinary Tract: Normal adrenal glands. Bilateral renal sinus cysts are identified. There is right-sided pelvocaliectasis and hydroureter. Within the mid right ureter there is a stone measuring 5 mm, image 49 of series 2. Urinary bladder appears normal. No left-sided obstructive uropathy. No left renal calculi. Stomach/Bowel: The stomach is within normal limits. The small bowel loops have a normal course and caliber. No obstruction. Multiple distal colonic diverticula noted. No acute inflammation. Vascular/Lymphatic: Calcified atherosclerotic disease involves the abdominal aorta. No  aneurysm. No enlarged retroperitoneal or mesenteric adenopathy. No enlarged pelvic or inguinal lymph nodes. Reproductive: Previous hysterectomy. No adnexal mass. Cyst and left ovary measures 3 x 1.7 cm Other: No free fluid or fluid collections within the abdomen or pelvis. Musculoskeletal: There is a scoliosis deformity involving the thoracic and lumbar spine. Associated multi level degenerative disc disease is identified. IMPRESSION: 1. Right mid ureteral calculus is identified measuring 5 mm. 2. Bilateral renal sinus cysts. 3. Aortic atherosclerosis 4. Scoliosis and degenerative disc disease. Electronically Signed   By: Kerby Moors M.D.   On: 06/27/2015 14:15    Microbiology: Recent Results (from the past 240 hour(s))  Surgical pcr screen     Status: None   Collection Time: 06/28/15  6:27 PM  Result Value Ref Range Status   MRSA,  PCR NEGATIVE NEGATIVE Final   Staphylococcus aureus NEGATIVE NEGATIVE Final    Comment:        The Xpert SA Assay (FDA approved for NASAL specimens in patients over 67 years of age), is one component of a comprehensive surveillance program.  Test performance has been validated by [facility] for patients greater than or equal to 28 year old. It is not intended to diagnose infection nor to guide or monitor treatment.      Labs: Basic Metabolic Panel:  Recent Labs Lab 06/27/15 1215 06/28/15 0446 06/29/15 0430 06/30/15 0435  NA 143 139 140 140  K 4.1 4.4 3.9 4.2  CL 105 105 106 105  CO2 $Re'27 26 27 29  'XxQ$ GLUCOSE 104* 123* 163* 113*  BUN 23* 21* 22* 16  CREATININE 1.59* 1.31* 1.12* 1.00  CALCIUM 8.5* 7.9* 7.4* 7.6*   Liver Function Tests:  Recent Labs Lab 06/27/15 1215 06/28/15 0446 06/29/15 0430 06/30/15 0435  AST 18 40 33 49*  ALT 17 33 28 32  ALKPHOS 79 90 79 88  BILITOT 0.5 0.7 0.6 0.6  PROT 6.4* 6.3* 5.7* 6.1*  ALBUMIN 3.1* 3.1* 2.8* 2.8*    Recent Labs Lab 06/27/15 1215  LIPASE 19    Recent Labs Lab 06/30/15 1115  AMMONIA  25   CBC:  Recent Labs Lab 06/27/15 1215 06/28/15 0446 06/29/15 0430 06/30/15 0435  WBC 6.9 7.6 8.7 7.3  NEUTROABS  --   --  7.0 5.4  HGB 12.5 12.2 10.9* 11.4*  HCT 38.8 39.3 35.5* 37.1  MCV 88.6 92.0 93.4 93.7  PLT 150 147* 135* 137*   Cardiac Enzymes: No results for input(s): CKTOTAL, CKMB, CKMBINDEX, TROPONINI in the last 168 hours. BNP: BNP (last 3 results)  Recent Labs  11/03/14 0638 12/21/14 0940 01/07/15 1745  BNP 63.6 74.2 149.7*    ProBNP (last 3 results) No results for input(s): PROBNP in the last 8760 hours.  CBG:  Recent Labs Lab 06/29/15 0730 06/30/15 0742 06/30/15 1018 07/01/15 0828 07/02/15 0712  GLUCAP 113* 127* 149* 120* 116*       Signed:  Louellen Molder MD.  [facility] 07/02/2015, 10:21 AM

## 2015-07-02 NOTE — Progress Notes (Signed)
0215 pt called nurse in and stated she felt like she had a stroke. RN attempted to reorient the patient, but reported she was unsure of how she got to the hospital. Charge nurse and patient's nurse assessed patient for stroke, no signs of stroke and vital signs stable. Pt placed on 4L nasal cannula and SPO2 saturations at 84-85%. Respiratory paged for PRN nebulizer treatment. Pt had mild crackles in bases of lungs per respiratory therapist assessment. Pt unable to maintain oxygen saturation and placed on a non-rebreather at 15L. Rapid response and K.Schorr paged to assess patient. Rapid response assessed and found no indication of a stroke. STAT chest x-ray ordered. K. Schorr came to floor to assess patient, no signs of a stroke found. K. Schorr examined chest x-ray. Order placed for ocean nasal spray and 40 mg IV lasix. Pt placed back on CPAP machine with 5L oxygen, SPO2 at 95-96%. Will administer ordered medication and continue to monitor and reassess.

## 2015-07-02 NOTE — Progress Notes (Signed)
Shift event note:  Notified by RN that pt awoke confused and did not know where she was. Requested CPAP be removed because she felt like she could not breathe.  Pt concerned she has had a stroke and was requesting to see MD. RR RN was paged and responded to bedside. No obvious focal findings on RN exam. Stat PCXR requested. At bedside noted resting in NAD. She is alert and oriented to person. She follws commands and answers questions appropriately. 02 sats of 94% on NRB. BBS diminished w/ expiratory wheezes bil. No objective increasd WOB. There is no obvious facial droop, speech is clear, PEARRL, pt mae's x 4 w/ equal grips.  CN II-XII appear grossly intact. VSS and pt remains afebrile. CXR findings c/w increased interstitial edema since previous exam. Assessment/Plan: 1. Confusion: Acute encephalopathy in setting of acute on chronic hypoxic respiratory failure (OSA). Mental status has improved since admission but pt continues to have periods of intermittent confusion. Question a component of sundowning as well. Seems to have returned to baseline.  2. Dypsnea : CXR c/w increased interstitial edema. Lasix 40 mg given IV.  Will place pt back on CPAP until am. Continue to monitor closely on telemetry.  Jeryl Columbia, NP-C [facility] [phone]

## 2015-07-03 ENCOUNTER — Ambulatory Visit (INDEPENDENT_AMBULATORY_CARE_PROVIDER_SITE_OTHER): Payer: Medicare Other | Admitting: Family Medicine

## 2015-07-03 VITALS — BP 138/89 | HR 88 | Temp 98.3°F | Resp 20

## 2015-07-03 DIAGNOSIS — R109 Unspecified abdominal pain: Secondary | ICD-10-CM | POA: Diagnosis not present

## 2015-07-03 MED ORDER — TAMSULOSIN HCL 0.4 MG PO CAPS: 0.4000 mg | ORAL_CAPSULE | Freq: Every day | ORAL | Status: DC

## 2015-07-03 NOTE — Patient Instructions (Signed)
I will you to call me if you're not getting significantly better in the next 20-48 hours. If the pain worsens significantly or if he starts running fever, he'll need to go to the emergency room. The best one to go to his [facility] since they have all the imaging capability for kidney stones and other structures in that area.

## 2015-07-03 NOTE — Progress Notes (Signed)
Patient ID: Tamara Morales, female   DOB: [birth year], 82 y.o.   MRN: [id number]   This chart was scribed for Robyn Haber, MD by [facility], medical scribe at [facility].The patient was seen in exam room 03  and the patient's care was started at 10:29 AM.  Patient ID: Tamara Morales MRN: [id number], DOB: 03-Mar-1931, 82 y.o. Date of Encounter: 07/03/2015  Primary Physician: Garret Reddish, MD  Chief Complaint:  Chief Complaint  Patient presents with  . Flank Pain    Rt Flank pain . Was DX yesterday from [facility]   HPI:  Tamara Morales is a 82 y.o. female who presents to [facility] complaining of right flank pain since yesterday afternoon after being discharged from the hospital. She was seen here last week or similar pain and sent to the hospital due to kidney stones, which she had surgically removed. The pain is similar to the previous kidney stones but not as severe and slightly above the original area. Diarrhea last night, felt as though she passed a clot through her urine. Having intermittent nausea but no fever. Recent hospitalization due to kidney stones, which she had removed. Currently on Augmentin. Also has a persistent cough and wheezing, while in the hospital she had a chest x-ray which showed pneumonia.   Past Medical History  Diagnosis Date  . Morbid obesity ([place])   . MIGRAINE HEADACHE   . ARTHRITIS, KNEES, BILATERAL   . DEPRESSION   . GERD   . HYPERLIPIDEMIA   . HYPERTENSION   . DJD (degenerative joint disease) of knee   . Urge incontinence   . HYPOTHYROIDISM     postsurgical  . OSA (obstructive sleep apnea) 01/17/2011 dx  . Hypoxia   . Oxygen dependent   . Breast CA ([place]) 2000    s/p R lumpectomy and XRT  . UTI (lower urinary tract infection) 12/2014     Home Meds: Prior to Admission medications   Medication Sig Start Date End Date Taking? Authorizing Provider  ALPRAZolam (XANAX) 0.25 MG tablet TAKE 1/2 TO 1  TABLET BY MOUTH TWICE A DAY AS NEEDED FOR ANXIETY OR SLEEP Patient taking differently: Take 0.125-0.25 mg by mouth 2 (two) times daily as needed for anxiety or sleep. TAKE 1/2 TO 1 TABLET BY MOUTH TWICE A DAY AS NEEDED FOR ANXIETY OR SLEEP 06/25/15  Yes Marin Olp, MD  amLODipine-valsartan (EXFORGE) 5-160 MG tablet Take 1 tablet by mouth daily. 05/10/15  Yes Marin Olp, MD  amoxicillin-clavulanate (AUGMENTIN) 875-125 MG tablet Take 1 tablet by mouth 2 (two) times daily. 07/02/15 07/06/15 Yes Nishant Dhungel, MD  aspirin 81 MG tablet Take 81 mg by mouth daily.    Yes Historical Provider, MD  cetirizine (ZYRTEC) 10 MG tablet Take 10 mg by mouth daily as needed for allergies.    Yes Historical Provider, MD  clotrimazole (LOTRIMIN) 1 % cream Apply 1 application topically 2 (two) times daily. As needed for fungal skin infections 04/02/15  Yes Marin Olp, MD  FIBER PO Take 2 tablets by mouth every morning.    Yes Historical Provider, MD  fluconazole (DIFLUCAN) 100 MG tablet Take 1 tablet (100 mg total) by mouth daily. 07/02/15 07/06/15 Yes Nishant Dhungel, MD  hydrochlorothiazide (HYDRODIURIL) 12.5 MG tablet Take 1 tablet (12.5 mg total) by mouth daily. 05/10/15  Yes Marin Olp, MD  isometheptene-acetaminophen-dichloralphenazone (MIDRIN) [phone] MG capsule Take 1 capsule by mouth daily as needed for  migraine. 04/02/15  Yes Marin Olp, MD  nystatin (MYCOSTATIN/NYSTOP) 100000 UNIT/GM POWD APPLY TO AFFECTED AREA TWICE A DAY 11/25/14  Yes Marin Olp, MD  Omega-3 Fatty Acids (FISH OIL) 1200 MG CAPS Take 2,400 mg by mouth daily.    Yes Historical Provider, MD  omeprazole (PRILOSEC) 20 MG capsule TAKE 1 CAPSULE (20 MG TOTAL) BY MOUTH 2 (TWO) TIMES DAILY. 11/06/14  Yes Marin Olp, MD  potassium chloride (MICRO-K) 10 MEQ CR capsule Take 4 capsules (40 mEq total) by mouth daily. Patient taking differently: Take 20 mEq by mouth 2 (two) times daily.  05/10/15  Yes Minus Breeding, MD    SYNTHROID 200 MCG tablet Take 1 tablet (200 mcg total) by mouth daily before breakfast. 04/02/15  Yes Marin Olp, MD  fluticasone [facility]) 50 MCG/ACT nasal spray Place 2 sprays into both nostrils daily. Patient not taking: Reported on 07/03/2015 01/11/15   Chesley Mires, MD  torsemide (DEMADEX) 20 MG tablet Take 1 tablet (20 mg total) by mouth daily. 04/30/15   Minus Breeding, MD   Allergies:  Allergies  Allergen Reactions  . Azithromycin Itching  . Beta Adrenergic Blockers     Depression   . Ciprofloxacin Other (See Comments)    REACTION: edgy and very jumpy   . Codeine Nausea And Vomiting  . Epinephrine Other (See Comments)    REACTION: rapid pulse, sweats  . Klonopin [Clonazepam] Other (See Comments)    Makes her feel very suicidal   . Oxycodone Other (See Comments)    Patient felt like it altered her mental status "Crazy" . Hallucinations later as well.  [name] Paxil [Paroxetine Hydrochloride] Other (See Comments)    Sever depression   . Prednisone     "makes me feel really bad"  . Propoxyphene Hcl Nausea And Vomiting  . Tramadol Other (See Comments)    Feels "jittery"  . Meloxicam Diarrhea  . Vancomycin Rash    Localized rash related to infusion rate.   Social History   Social History  . Marital Status: Married    Spouse Name: N/A  . Number of Children: 5  . Years of Education: N/A   Occupational History  . RETIRED     worked on office   Social History Main Topics  . Smoking status: Former Smoker -- 0.25 packs/day for 2 years    Types: Cigarettes    Quit date: 06/19/1974  . Smokeless tobacco: Never Used     Comment: Quit in late 20's  . Alcohol Use: 0.0 oz/week    0 Standard drinks or equivalent per week     Comment: rare  . Drug Use: No  . Sexual Activity: No   Other Topics Concern  . Not on file   Social History Narrative   Married to husband that has dementia (he lives in the skilled nursing wing) and this is a big stressor. 5 children. 10  grandchildren. 2 greatgrandchildren. All children [place]      Lives at [facility], in independent living.      Retired from multiple different Community education officer, university, Psychologist, educational.       Hobbies: Management consultant, write poetry, craft dolls    Review of Systems: Constitutional: negative for chills, fever, night sweats, weight changes, or fatigue  HEENT: negative for vision changes, hearing loss, congestion, rhinorrhea, ST, epistaxis, or sinus pressure Cardiovascular: negative for chest pain or palpitations Respiratory: negative for hemoptysis, shortness of breath. Positive for cough and wheezing. Abdominal: negative for  abdominal pain, nausea, vomiting, diarrhea, or constipation. Positive for nausea, diarrhea  Dermatological: negative for rash Genitourinary: Positive for flank pain. Neurologic: negative for headache, dizziness, or syncope All other systems reviewed and are otherwise negative with the exception to those above and in the HPI.  Physical Exam: Blood pressure 138/89, pulse 88, temperature 98.3 F (36.8 C), temperature source Oral, resp. rate 20, SpO2 90 %., There is no weight on file to calculate BMI. General: Well developed, well nourished, in no acute distress. Head: Normocephalic, atraumatic, eyes without discharge, sclera non-icteric, nares are without discharge. Bilateral auditory canals clear, TM's are without perforation, pearly grey and translucent with reflective cone of light bilaterally. Oral cavity moist, posterior pharynx without exudate, erythema, peritonsillar abscess, or post nasal drip.  Neck: Supple. No thyromegaly. Full ROM. No lymphadenopathy. Lungs: Few rales in the right base. Heart: RRR with S1 S2. No murmurs, rubs, or gallops appreciated. Abdomen: Soft, non-tender, non-distended with normoactive bowel sounds. No hepatomegaly. No rebound/guarding. No obvious abdominal masses. Genitourinary: Tender in her right flank, Msk:  Strength and tone normal  for age. Extremities/Skin: Warm and dry. No clubbing or cyanosis. No edema. No rashes or suspicious lesions. Neuro: Alert and oriented X 3. Moves all extremities spontaneously. Gait is normal. CNII-XII grossly in tact. Psych:  Responds to questions appropriately with a normal affect.    ASSESSMENT AND PLAN:  82 y.o. year old female with flank pain is most consistent with mild obstruction from blood clots in her right ureter.  By signing my name below, I, Nadim Abuhashem, attest that this documentation has been prepared under the direction and in the presence of Robyn Haber, MD.  Electronically Signed: Lora Havens, medical scribe. 07/03/2015 10:44 AM.  This chart was scribed in my presence and reviewed by me personally.    ICD-9-CM ICD-10-CM   1. Right flank pain 789.09 R10.9 tamsulosin (FLOMAX) 0.4 MG CAPS capsule     Signed, Robyn Haber, MD

## 2015-07-05 ENCOUNTER — Encounter: Payer: Self-pay | Admitting: Nurse Practitioner

## 2015-07-05 ENCOUNTER — Non-Acute Institutional Stay (SKILLED_NURSING_FACILITY): Payer: Medicare Other | Admitting: Nurse Practitioner

## 2015-07-05 ENCOUNTER — Encounter: Payer: Self-pay | Admitting: Occupational Therapy

## 2015-07-05 DIAGNOSIS — N132 Hydronephrosis with renal and ureteral calculous obstruction: Secondary | ICD-10-CM

## 2015-07-05 DIAGNOSIS — J181 Lobar pneumonia, unspecified organism: Secondary | ICD-10-CM

## 2015-07-05 DIAGNOSIS — F418 Other specified anxiety disorders: Secondary | ICD-10-CM | POA: Diagnosis not present

## 2015-07-05 DIAGNOSIS — N390 Urinary tract infection, site not specified: Secondary | ICD-10-CM | POA: Diagnosis not present

## 2015-07-05 DIAGNOSIS — R1031 Right lower quadrant pain: Secondary | ICD-10-CM

## 2015-07-05 DIAGNOSIS — F329 Major depressive disorder, single episode, unspecified: Secondary | ICD-10-CM

## 2015-07-05 DIAGNOSIS — E039 Hypothyroidism, unspecified: Secondary | ICD-10-CM

## 2015-07-05 DIAGNOSIS — K219 Gastro-esophageal reflux disease without esophagitis: Secondary | ICD-10-CM

## 2015-07-05 DIAGNOSIS — N201 Calculus of ureter: Secondary | ICD-10-CM | POA: Diagnosis not present

## 2015-07-05 DIAGNOSIS — I890 Lymphedema, not elsewhere classified: Secondary | ICD-10-CM | POA: Diagnosis not present

## 2015-07-05 DIAGNOSIS — I10 Essential (primary) hypertension: Secondary | ICD-10-CM

## 2015-07-05 DIAGNOSIS — F419 Anxiety disorder, unspecified: Secondary | ICD-10-CM

## 2015-07-05 DIAGNOSIS — F32A Depression, unspecified: Secondary | ICD-10-CM

## 2015-07-05 NOTE — Assessment & Plan Note (Addendum)
will complete antibiotic course (Augmentin) on 07/06/2015 for UTI. Update CBC

## 2015-07-05 NOTE — Assessment & Plan Note (Signed)
Stable, continue Omeprazole 40mg  bid.

## 2015-07-05 NOTE — Assessment & Plan Note (Signed)
R Ureteral stone with hydronephrosis, has Urology consultation, F/u Dr. Tresa Moore in 6 weeks. CT renal study showed 5 mm partially obstructing right ureteral stone.  underwent cystoscopy with right retrograde pyelogram, right ureteroscopy with laser lithotripsy and insertion of right ureteral stent.

## 2015-07-05 NOTE — Assessment & Plan Note (Signed)
1/11 became acutely encephalopathic and hypoxia with O2 sat in the 40s. Placed on face mask improved o2 sat. X-ray done showed questionable right middle lobe infiltrate. Complete PCN

## 2015-07-05 NOTE — Assessment & Plan Note (Addendum)
Controlled, continue Amlodipine Valsartan 5/160mg  daily. HCTZ 12.5mg  daily. Torsemide 20mg . Update CMP

## 2015-07-05 NOTE — Assessment & Plan Note (Addendum)
Continue Synthyroid 241mcg, last TSH 0.768 07/03/15.

## 2015-07-05 NOTE — Assessment & Plan Note (Signed)
Continue HCTZ, Torsemide.

## 2015-07-05 NOTE — Assessment & Plan Note (Signed)
May continue Xanax 0.$RemoveBeforeDE'5mg'[id number]$  q6h prn. Observe.

## 2015-07-05 NOTE — Assessment & Plan Note (Signed)
R flank pain, identified the right ureteral stone, s/p lithotripsy and stent, f/u Urology 6 weeks, continue Flomax 0.$RemoveBeforeDEI'4mg'[id number]$  daily started 07/03/15 per Urgent care.

## 2015-07-05 NOTE — Progress Notes (Signed)
Patient ID: Tamara Morales, female   DOB: 17-Jan-1931, 82 y.o.   MRN: [id number]  Location:  SNF [facility] Provider:  Chipper Oman NP  Code Status:  DNR Goals of care: Advanced Directive information    Chief Complaint  Patient presents with  . Medical Management of Chronic Issues  . Acute Visit    insomnia     HPI: Patient is a 82 y.o. female seen in the SNF at [facility] today for evaluation of insomnia. Hx of Xanax use for anxiety/depression, held while the acute encephalopathy, may resume now. Urgent care evaluation 07/03/15 R flank pain, Flomax 0.4mg  started. Denied R flank or abd pain today.     Hospitalized 06/27/15 to 07/02/15 R Ureteral stone with hydronephrosis, has Urology consultation, will complete antibiotic course (Augmentin) on 07/06/2015 for UTI (urinary tract infection) and PNA. F/u Dr. Berneice Heinrich in 6 weeks. CT renal study showed 5 mm partially obstructing right ureteral stone.  underwent cystoscopy with right retrograde pyelogram, right ureteroscopy with laser lithotripsy and insertion of right ureteral stent. 1/11 became acutely encephalopathic and hypoxia with O2 sat in the 40s. Placed on face mask improved o2 sat. X-ray done showed questionable right middle lobe infiltrate       Review of Systems  Constitutional:       Obese  HENT: Negative for congestion, ear pain, hearing loss, sore throat and tinnitus.   Eyes: Negative for photophobia, pain and redness.  Respiratory: Negative for cough and shortness of breath.   Cardiovascular: Positive for leg swelling. Negative for chest pain and palpitations.       Chronic BLE, dermatosclerosis changes  Gastrointestinal: Negative for nausea, vomiting, abdominal pain, diarrhea, constipation and blood in stool.  Genitourinary: Positive for flank pain. Negative for dysuria, urgency, frequency and hematuria.       The right   Musculoskeletal: Positive for back pain. Negative for myalgias.  Skin: Negative for rash.    Neurological: Negative for dizziness, weakness and headaches.  Psychiatric/Behavioral: The patient has insomnia. The patient is not nervous/anxious.     Past Medical History  Diagnosis Date  . Morbid obesity ([facility])   . MIGRAINE HEADACHE   . ARTHRITIS, KNEES, BILATERAL   . DEPRESSION   . GERD   . HYPERLIPIDEMIA   . HYPERTENSION   . DJD (degenerative joint disease) of knee   . Urge incontinence   . HYPOTHYROIDISM     postsurgical  . OSA (obstructive sleep apnea) 01/17/2011 dx  . Hypoxia   . Oxygen dependent   . Breast CA ([facility]) 2000    s/p R lumpectomy and XRT  . UTI (lower urinary tract infection) 12/2014    Patient Active Problem List   Diagnosis Date Noted  . Acid reflux 07/05/2015  . Lobar pneumonia ([facility]) 07/02/2015  . Lymphedema of both lower extremities 07/02/2015  . UTI (urinary tract infection) 07/02/2015  . Encephalopathy   . Ureterolithiasis   . Ureteral stone with hydronephrosis 06/28/2015  . Acute renal failure superimposed on stage 4 chronic kidney disease ([facility]) 06/27/2015  . Right ureteral calculus 06/27/2015  . Anxiety and depression 06/27/2015  . History of breast cancer 06/27/2015  . Abdominal pain, RLQ (right lower quadrant) 06/27/2015  . AKI (acute kidney injury) ([facility])   . Obesity hypoventilation syndrome ([facility]) 11/27/2014  . Morbid obesity ([facility]) 10/04/2009  . Hypothyroidism 08/05/2009  . Essential hypertension 08/05/2009    Allergies  Allergen Reactions  . Azithromycin Itching  . Beta Adrenergic Blockers  Depression   . Ciprofloxacin Other (See Comments)    REACTION: edgy and very jumpy   . Codeine Nausea And Vomiting  . Epinephrine Other (See Comments)    REACTION: rapid pulse, sweats  . Klonopin [Clonazepam] Other (See Comments)    Makes her feel very suicidal   . Oxycodone Other (See Comments)    Patient felt like it altered her mental status "Crazy" . Hallucinations later as well.  [name] Paxil [Paroxetine Hydrochloride] Other (See  Comments)    Sever depression   . Prednisone     "makes me feel really bad"  . Propoxyphene Hcl Nausea And Vomiting  . Tramadol Other (See Comments)    Feels "jittery"  . Meloxicam Diarrhea  . Vancomycin Rash    Localized rash related to infusion rate.    Medications: Patient's Medications  New Prescriptions   No medications on file  Previous Medications   ALPRAZOLAM (XANAX) 0.25 MG TABLET    TAKE 1/2 TO 1 TABLET BY MOUTH TWICE A DAY AS NEEDED FOR ANXIETY OR SLEEP   AMLODIPINE-VALSARTAN (EXFORGE) 5-160 MG TABLET    Take 1 tablet by mouth daily.   AMOXICILLIN-CLAVULANATE (AUGMENTIN) 875-125 MG TABLET    Take 1 tablet by mouth 2 (two) times daily.   ASPIRIN 81 MG TABLET    Take 81 mg by mouth daily.    CETIRIZINE (ZYRTEC) 10 MG TABLET    Take 10 mg by mouth daily as needed for allergies.    CLOTRIMAZOLE (LOTRIMIN) 1 % CREAM    Apply 1 application topically 2 (two) times daily. As needed for fungal skin infections   FIBER PO    Take 2 tablets by mouth every morning.    FLUCONAZOLE (DIFLUCAN) 100 MG TABLET    Take 1 tablet (100 mg total) by mouth daily.   FLUTICASONE (FLONASE) 50 MCG/ACT NASAL SPRAY    Place 2 sprays into both nostrils daily.   HYDROCHLOROTHIAZIDE (HYDRODIURIL) 12.5 MG TABLET    Take 1 tablet (12.5 mg total) by mouth daily.   ISOMETHEPTENE-ACETAMINOPHEN-DICHLORALPHENAZONE (MIDRIN) 65-100-325 MG CAPSULE    Take 1 capsule by mouth daily as needed for migraine.   NYSTATIN (MYCOSTATIN/NYSTOP) 100000 UNIT/GM POWD    APPLY TO AFFECTED AREA TWICE A DAY   OMEGA-3 FATTY ACIDS (FISH OIL) 1200 MG CAPS    Take 2,400 mg by mouth daily.    OMEPRAZOLE (PRILOSEC) 20 MG CAPSULE    TAKE 1 CAPSULE (20 MG TOTAL) BY MOUTH 2 (TWO) TIMES DAILY.   POTASSIUM CHLORIDE (MICRO-K) 10 MEQ CR CAPSULE    Take 4 capsules (40 mEq total) by mouth daily.   SYNTHROID 200 MCG TABLET    Take 1 tablet (200 mcg total) by mouth daily before breakfast.   TAMSULOSIN (FLOMAX) 0.4 MG CAPS CAPSULE    Take 1 capsule  (0.4 mg total) by mouth daily.   TORSEMIDE (DEMADEX) 20 MG TABLET    Take 1 tablet (20 mg total) by mouth daily.  Modified Medications   No medications on file  Discontinued Medications   No medications on file    Physical Exam: Filed Vitals:   07/05/15 1407  BP: 128/86  Pulse: 89  Temp: 97.6 F (36.4 C)  TempSrc: Tympanic  Resp: 20   There is no weight on file to calculate BMI.  Physical Exam  Constitutional: She is oriented to person, place, and time. She appears well-developed and well-nourished. She is active and cooperative. No distress.  BP 144/81 mmHg  Pulse 87  Temp(Src)  98 F (36.7 C) (Oral)  Resp 17  SpO2 95%  HENT:  Head: Normocephalic and atraumatic.  Right Ear: Hearing and external ear normal.  Left Ear: Hearing and external ear normal.  Nose: Nose normal.  Mouth/Throat: Oropharynx is clear and moist.  Eyes: Conjunctivae are normal. No scleral icterus.  Neck: Normal range of motion. Neck supple. No thyromegaly present.  Cardiovascular: Normal rate, regular rhythm, normal heart sounds and intact distal pulses.   Pulses:      Radial pulses are 2+ on the right side, and 2+ on the left side.       Dorsalis pedis pulses are 1+ on the right side, and 1+ on the left side.  Pulmonary/Chest: Effort normal and breath sounds normal.  Abdominal: Soft. There is no tenderness.  Musculoskeletal: She exhibits edema.       Lumbar back: She exhibits decreased range of motion and pain. She exhibits no tenderness, no bony tenderness, no swelling and no edema.       Back:  Electric w/c for mobility Chronic BLE dermatosclerosis, trace edema   Lymphadenopathy:       Head (right side): No tonsillar, no preauricular, no posterior auricular and no occipital adenopathy present.       Head (left side): No tonsillar, no preauricular, no posterior auricular and no occipital adenopathy present.    She has no cervical adenopathy.       Right: No supraclavicular adenopathy present.        Left: No supraclavicular adenopathy present.  Both lower legs with substantial swelling and skin changes consistent with the chronic swelling.  Neurological: She is alert and oriented to person, place, and time. No sensory deficit.  Skin: Skin is warm, dry and intact. No cyanosis or erythema. Nails show no clubbing.  Psychiatric: She has a normal mood and affect. Her speech is normal and behavior is normal.    Labs reviewed: Basic Metabolic Panel:  Recent Labs  40/98/11 0500  06/28/15 0446 06/29/15 0430 06/30/15 0435  NA 140  < > 139 140 140  K 3.7  < > 4.4 3.9 4.2  CL 107  < > 105 106 105  CO2 27  < > 26 27 29   GLUCOSE 106*  < > 123* 163* 113*  BUN 8  < > 21* 22* 16  CREATININE 0.73  < > 1.31* 1.12* 1.00  CALCIUM 7.4*  < > 7.9* 7.4* 7.6*  MG 1.8  --   --   --   --   < > = values in this interval not displayed.  Liver Function Tests:  Recent Labs  06/28/15 0446 06/29/15 0430 06/30/15 0435  AST 40 33 49*  ALT 33 28 32  ALKPHOS 90 79 88  BILITOT 0.7 0.6 0.6  PROT 6.3* 5.7* 6.1*  ALBUMIN 3.1* 2.8* 2.8*    CBC:  Recent Labs  01/10/15 0500  06/28/15 0446 06/29/15 0430 06/30/15 0435  WBC 6.0  < > 7.6 8.7 7.3  NEUTROABS 4.2  --   --  7.0 5.4  HGB 10.7*  < > 12.2 10.9* 11.4*  HCT 33.8*  < > 39.3 35.5* 37.1  MCV 93.1  < > 92.0 93.4 93.7  PLT 155  < > 147* 135* 137*  < > = values in this interval not displayed.  Lab Results  Component Value Date   TSH 0.768 06/23/2015   Lab Results  Component Value Date   HGBA1C 6.2 06/23/2015   Lab Results  Component  Value Date   CHOL 172 07/29/2014   HDL 42.30 07/29/2014   LDLCALC 98 07/29/2014   LDLDIRECT 99.4 03/13/2012   TRIG 159.0* 07/29/2014   CHOLHDL 4 07/29/2014    Significant Diagnostic Results since last visit: none  Patient Care Team: Shelva Majestic, MD as PCP - General (Family Medicine) Wendall Stade, MD as Consulting Physician (Cardiology) Shea Evans, MD as Consulting Physician (Obstetrics  and Gynecology) Coralyn Helling, MD as Consulting Physician (Pulmonary Disease)  Assessment/Plan Problem List Items Addressed This Visit    UTI (urinary tract infection)     will complete antibiotic course (Augmentin) on 07/06/2015 for UTI. Update CBC      Ureterolithiasis - Primary    R Ureteral stone with hydronephrosis, has Urology consultation, F/u Dr. Berneice Heinrich in 6 weeks. CT renal study showed 5 mm partially obstructing right ureteral stone.  underwent cystoscopy with right retrograde pyelogram, right ureteroscopy with laser lithotripsy and insertion of right ureteral stent.       Ureteral stone with hydronephrosis    R Ureteral stone with hydronephrosis, has Urology consultation, F/u Dr. Berneice Heinrich in 6 weeks. CT renal study showed 5 mm partially obstructing right ureteral stone.  underwent cystoscopy with right retrograde pyelogram, right ureteroscopy with laser lithotripsy and insertion of right ureteral stent.        Lymphedema of both lower extremities    Continue HCTZ, Torsemide.       Lobar pneumonia ([facility])    1/11 became acutely encephalopathic and hypoxia with O2 sat in the 40s. Placed on face mask improved o2 sat. X-ray done showed questionable right middle lobe infiltrate. Complete PCN        Hypothyroidism    Continue Synthyroid , last TSH 0.768 07/03/15.       Essential hypertension    Controlled, continue Amlodipine Valsartan 5/160mg  daily. HCTZ 12.5mg  daily. Torsemide 20mg . Update CMP      Anxiety and depression    May continue Xanax 0.5mg  q6h prn. Observe.       Acid reflux    Stable, continue Omeprazole 40mg  bid.       Abdominal pain, RLQ (right lower quadrant)    R flank pain, identified the right ureteral stone, s/p lithotripsy and stent, f/u Urology 6 weeks, continue Flomax 0.4mg  daily started 07/03/15 per Urgent care.           Family/ staff Communication: IL when able.   Labs/tests ordered:  CBC and CMP  ManXie Cynthya Yam NP Geriatrics [facility] [address], [place] [address] On Call:  [phone] & follow prompts after 5pm & weekends Office Phone:  [phone] Office Fax:  [phone]

## 2015-07-06 LAB — BASIC METABOLIC PANEL
BUN: 23 mg/dL — AB (ref 4–21)
Creatinine: 1.2 mg/dL — AB (ref 0.5–1.1)
Glucose: 102 mg/dL
Potassium: 2.9 mmol/L — AB (ref 3.4–5.3)
SODIUM: 141 mmol/L (ref 137–147)

## 2015-07-06 LAB — CBC AND DIFFERENTIAL
HEMATOCRIT: 35 % — AB (ref 36–46)
Hemoglobin: 12 g/dL (ref 12.0–16.0)
PLATELETS: 219 10*3/uL (ref 150–399)
WBC: 8.5 10^3/mL

## 2015-07-06 LAB — HEPATIC FUNCTION PANEL
ALK PHOS: 76 U/L (ref 25–125)
ALT: 15 U/L (ref 7–35)
AST: 12 U/L — AB (ref 13–35)

## 2015-07-07 ENCOUNTER — Encounter: Payer: Self-pay | Admitting: Occupational Therapy

## 2015-07-07 ENCOUNTER — Other Ambulatory Visit: Payer: Self-pay | Admitting: Nurse Practitioner

## 2015-07-07 DIAGNOSIS — E876 Hypokalemia: Secondary | ICD-10-CM

## 2015-07-08 ENCOUNTER — Ambulatory Visit: Payer: Self-pay | Admitting: Family Medicine

## 2015-07-09 ENCOUNTER — Encounter: Payer: Self-pay | Admitting: Occupational Therapy

## 2015-07-12 ENCOUNTER — Encounter: Payer: Self-pay | Admitting: Occupational Therapy

## 2015-07-12 DIAGNOSIS — M6281 Muscle weakness (generalized): Secondary | ICD-10-CM | POA: Diagnosis not present

## 2015-07-12 DIAGNOSIS — R601 Generalized edema: Secondary | ICD-10-CM | POA: Diagnosis not present

## 2015-07-12 DIAGNOSIS — R2681 Unsteadiness on feet: Secondary | ICD-10-CM | POA: Diagnosis not present

## 2015-07-12 DIAGNOSIS — M25551 Pain in right hip: Secondary | ICD-10-CM | POA: Diagnosis not present

## 2015-07-12 DIAGNOSIS — I890 Lymphedema, not elsewhere classified: Secondary | ICD-10-CM | POA: Diagnosis not present

## 2015-07-13 DIAGNOSIS — E039 Hypothyroidism, unspecified: Secondary | ICD-10-CM | POA: Diagnosis not present

## 2015-07-13 LAB — BASIC METABOLIC PANEL
BUN: 19 mg/dL (ref 4–21)
CREATININE: 0.8 mg/dL (ref 0.5–1.1)
Glucose: 113 mg/dL
Potassium: 4.3 mmol/L (ref 3.4–5.3)
Sodium: 140 mmol/L (ref 137–147)

## 2015-07-14 ENCOUNTER — Encounter: Payer: Self-pay | Admitting: Occupational Therapy

## 2015-07-14 DIAGNOSIS — M6281 Muscle weakness (generalized): Secondary | ICD-10-CM | POA: Diagnosis not present

## 2015-07-14 DIAGNOSIS — R601 Generalized edema: Secondary | ICD-10-CM | POA: Diagnosis not present

## 2015-07-14 DIAGNOSIS — M25551 Pain in right hip: Secondary | ICD-10-CM | POA: Diagnosis not present

## 2015-07-14 DIAGNOSIS — R2681 Unsteadiness on feet: Secondary | ICD-10-CM | POA: Diagnosis not present

## 2015-07-14 DIAGNOSIS — I890 Lymphedema, not elsewhere classified: Secondary | ICD-10-CM | POA: Diagnosis not present

## 2015-07-16 ENCOUNTER — Encounter: Payer: Self-pay | Admitting: Occupational Therapy

## 2015-07-16 DIAGNOSIS — I890 Lymphedema, not elsewhere classified: Secondary | ICD-10-CM | POA: Diagnosis not present

## 2015-07-16 DIAGNOSIS — R2681 Unsteadiness on feet: Secondary | ICD-10-CM | POA: Diagnosis not present

## 2015-07-16 DIAGNOSIS — M25551 Pain in right hip: Secondary | ICD-10-CM | POA: Diagnosis not present

## 2015-07-16 DIAGNOSIS — R601 Generalized edema: Secondary | ICD-10-CM | POA: Diagnosis not present

## 2015-07-16 DIAGNOSIS — M6281 Muscle weakness (generalized): Secondary | ICD-10-CM | POA: Diagnosis not present

## 2015-07-19 ENCOUNTER — Encounter: Payer: Self-pay | Admitting: Occupational Therapy

## 2015-07-19 DIAGNOSIS — M6281 Muscle weakness (generalized): Secondary | ICD-10-CM | POA: Diagnosis not present

## 2015-07-19 DIAGNOSIS — R601 Generalized edema: Secondary | ICD-10-CM | POA: Diagnosis not present

## 2015-07-19 DIAGNOSIS — M25551 Pain in right hip: Secondary | ICD-10-CM | POA: Diagnosis not present

## 2015-07-19 DIAGNOSIS — R2681 Unsteadiness on feet: Secondary | ICD-10-CM | POA: Diagnosis not present

## 2015-07-19 DIAGNOSIS — I890 Lymphedema, not elsewhere classified: Secondary | ICD-10-CM | POA: Diagnosis not present

## 2015-07-21 ENCOUNTER — Encounter: Payer: Self-pay | Admitting: Occupational Therapy

## 2015-07-21 DIAGNOSIS — M6281 Muscle weakness (generalized): Secondary | ICD-10-CM | POA: Diagnosis not present

## 2015-07-21 DIAGNOSIS — R2681 Unsteadiness on feet: Secondary | ICD-10-CM | POA: Diagnosis not present

## 2015-07-21 DIAGNOSIS — M25551 Pain in right hip: Secondary | ICD-10-CM | POA: Diagnosis not present

## 2015-07-21 DIAGNOSIS — I890 Lymphedema, not elsewhere classified: Secondary | ICD-10-CM | POA: Diagnosis not present

## 2015-07-21 DIAGNOSIS — R601 Generalized edema: Secondary | ICD-10-CM | POA: Diagnosis not present

## 2015-07-23 ENCOUNTER — Encounter: Payer: Self-pay | Admitting: Occupational Therapy

## 2015-07-26 ENCOUNTER — Encounter (HOSPITAL_COMMUNITY): Payer: Self-pay | Admitting: Emergency Medicine

## 2015-07-26 ENCOUNTER — Emergency Department (HOSPITAL_COMMUNITY): Payer: Medicare Other

## 2015-07-26 ENCOUNTER — Emergency Department (HOSPITAL_COMMUNITY)
Admission: EM | Admit: 2015-07-26 | Discharge: 2015-07-26 | Disposition: A | Discharge status: Home / Self Care | Payer: Medicare Other | Attending: Emergency Medicine | Admitting: Emergency Medicine

## 2015-07-26 DIAGNOSIS — Z9981 Dependence on supplemental oxygen: Secondary | ICD-10-CM | POA: Insufficient documentation

## 2015-07-26 DIAGNOSIS — R0602 Shortness of breath: Secondary | ICD-10-CM | POA: Diagnosis present

## 2015-07-26 DIAGNOSIS — R0600 Dyspnea, unspecified: Secondary | ICD-10-CM | POA: Diagnosis not present

## 2015-07-26 DIAGNOSIS — Z853 Personal history of malignant neoplasm of breast: Secondary | ICD-10-CM | POA: Diagnosis not present

## 2015-07-26 DIAGNOSIS — Z79899 Other long term (current) drug therapy: Secondary | ICD-10-CM | POA: Diagnosis not present

## 2015-07-26 DIAGNOSIS — E039 Hypothyroidism, unspecified: Secondary | ICD-10-CM | POA: Diagnosis not present

## 2015-07-26 DIAGNOSIS — F329 Major depressive disorder, single episode, unspecified: Secondary | ICD-10-CM | POA: Insufficient documentation

## 2015-07-26 DIAGNOSIS — Z87440 Personal history of urinary (tract) infections: Secondary | ICD-10-CM | POA: Insufficient documentation

## 2015-07-26 DIAGNOSIS — K219 Gastro-esophageal reflux disease without esophagitis: Secondary | ICD-10-CM | POA: Diagnosis not present

## 2015-07-26 DIAGNOSIS — Z7982 Long term (current) use of aspirin: Secondary | ICD-10-CM | POA: Insufficient documentation

## 2015-07-26 DIAGNOSIS — Z87891 Personal history of nicotine dependence: Secondary | ICD-10-CM | POA: Diagnosis not present

## 2015-07-26 DIAGNOSIS — I10 Essential (primary) hypertension: Secondary | ICD-10-CM | POA: Diagnosis not present

## 2015-07-26 DIAGNOSIS — E785 Hyperlipidemia, unspecified: Secondary | ICD-10-CM | POA: Insufficient documentation

## 2015-07-26 DIAGNOSIS — R531 Weakness: Secondary | ICD-10-CM | POA: Diagnosis not present

## 2015-07-26 DIAGNOSIS — R404 Transient alteration of awareness: Secondary | ICD-10-CM | POA: Diagnosis not present

## 2015-07-26 DIAGNOSIS — G4733 Obstructive sleep apnea (adult) (pediatric): Secondary | ICD-10-CM | POA: Diagnosis not present

## 2015-07-26 LAB — CBC WITH DIFFERENTIAL/PLATELET
Basophils Absolute: 0 10*3/uL (ref 0.0–0.1)
Basophils Relative: 0 %
Eosinophils Absolute: 0.2 10*3/uL (ref 0.0–0.7)
Eosinophils Relative: 3 %
HCT: 36.3 % (ref 36.0–46.0)
Hemoglobin: 12 g/dL (ref 12.0–15.0)
Lymphocytes Relative: 19 %
Lymphs Abs: 1.2 10*3/uL (ref 0.7–4.0)
MCH: 29.2 pg (ref 26.0–34.0)
MCHC: 33.1 g/dL (ref 30.0–36.0)
MCV: 88.3 fL (ref 78.0–100.0)
Monocytes Absolute: 0.4 10*3/uL (ref 0.1–1.0)
Monocytes Relative: 7 %
Neutro Abs: 4.5 10*3/uL (ref 1.7–7.7)
Neutrophils Relative %: 71 %
Platelets: 145 10*3/uL — ABNORMAL LOW (ref 150–400)
RBC: 4.11 MIL/uL (ref 3.87–5.11)
RDW: 15.1 % (ref 11.5–15.5)
WBC: 6.3 10*3/uL (ref 4.0–10.5)

## 2015-07-26 LAB — BASIC METABOLIC PANEL
Anion gap: 12 (ref 5–15)
BUN: 15 mg/dL (ref 6–20)
CO2: 27 mmol/L (ref 22–32)
Calcium: 8.3 mg/dL — ABNORMAL LOW (ref 8.9–10.3)
Chloride: 101 mmol/L (ref 101–111)
Creatinine, Ser: 0.8 mg/dL (ref 0.44–1.00)
GFR calc Af Amer: >60 mL/min (ref >60)
GFR calc non Af Amer: >60 mL/min (ref >60)
Glucose, Bld: 127 mg/dL — ABNORMAL HIGH (ref 65–99)
Potassium: 3.6 mmol/L (ref 3.5–5.1)
Sodium: 140 mmol/L (ref 135–145)

## 2015-07-26 LAB — TROPONIN I: Troponin I: <0.03 ng/mL (ref <0.031)

## 2015-07-26 NOTE — Discharge Instructions (Signed)

## 2015-07-26 NOTE — ED Notes (Signed)
Breakfast tray delivered

## 2015-07-26 NOTE — ED Notes (Signed)
Per [facility] pt from [place] on [address]  Pt awoke at approx 4 am and felt like she couldn't breath and was weak. No past medical hx of respiratory issues. Upon EMS arrival pt on CPAP still stating that she felt like she couldn't breathe but lugs sounded clear. [place] 4L   Left arm needed extra needed exertion upon moving over to stretcher. Passed stroke screen. Movement came back after 5 min. Denies all pain. Nauseated. Arm surgery last month on R arm. Restriction.

## 2015-07-26 NOTE — ED Notes (Signed)
Meal tray ordered for pt  

## 2015-07-26 NOTE — ED Provider Notes (Signed)
CSN: [id number]     Arrival date & time 07/26/15  0548 History   First MD Initiated Contact with Patient 07/26/15 [phone]     Chief Complaint  Patient presents with  . Shortness of Breath  . Transient Ischemic Attack     (Consider location/radiation/quality/duration/timing/severity/associated sxs/prior Treatment) HPI   "I had and anxiety attack. What more is there to say?"  84yF with dyspnea. Woke up from sleep just before arrival and felt like she could not breath. Felt like her chest was constricted and could not physically take a deep breath. Denies pain. Currently no complaints. She reports a history of previous similar symptoms intermittently over the last several years. No precipitating factors that she has been able to identify. She went to bed in her usual state of health. She does use CPAP with supplemental oxygen. She felt like her device was working properly. She sleeps in a recliner. She has a history of reflux and reports compliance with prilosec. Reports she has discussed symptoms previously with both her PCP and pulmonologist. She has tried xanax but still has these episodes. She expresses frustration. Triage note was reviewed. Pt denies any acute neurological complaints to me currently or since recently resolved.   Past Medical History  Diagnosis Date  . Morbid obesity ([place])   . MIGRAINE HEADACHE   . ARTHRITIS, KNEES, BILATERAL   . DEPRESSION   . GERD   . HYPERLIPIDEMIA   . HYPERTENSION   . DJD (degenerative joint disease) of knee   . Urge incontinence   . HYPOTHYROIDISM     postsurgical  . OSA (obstructive sleep apnea) 01/17/2011 dx  . Hypoxia   . Oxygen dependent   . Breast CA ([place]) 2000    s/p R lumpectomy and XRT  . UTI (lower urinary tract infection) 12/2014   Past Surgical History  Procedure Laterality Date  . Cholecystectomy  1995  . Breast lumpectomy  2000  . Thyroidectomy    . Breast biopsy  2000  . Replacement total knee bilateral  1999, 2005  . Tubal  ligation    . Parathyroidectomy      2-3 removed  . Tonsillectomy    . Adenoidectomy    . Right leg femur repaired    . Abdominal hysterectomy    . Cataract extraction    . Cystoscopy/ureteroscopy/holmium laser Right 06/28/2015    Procedure: CYSTOSCOPY RIGHT RETROGRAD RIGHT URETEROSCOPY/HOLMIUM LASER WITH RIGHT STENT PLACEMENT;  Surgeon: Alexis Frock, MD;  Location: [facility];  Service: Urology;  Laterality: Right;   Family History  Problem Relation Age of Onset  . Emphysema Sister   . Coronary artery disease Neg Hx    Social History  Substance Use Topics  . Smoking status: Former Smoker -- 0.25 packs/day for 2 years    Types: Cigarettes    Quit date: 06/19/1974  . Smokeless tobacco: Never Used     Comment: Quit in late 20's  . Alcohol Use: 0.0 oz/week    0 Standard drinks or equivalent per week     Comment: rare   OB History    Gravida Para Term Preterm AB TAB SAB Ectopic Multiple Living   5 5             Review of Systems  All systems reviewed and negative, other than as noted in HPI.   Allergies  Azithromycin; Beta adrenergic blockers; Ciprofloxacin; Codeine; Epinephrine; Klonopin; Oxycodone; Paxil; Prednisone; Propoxyphene hcl; Tramadol; Meloxicam; and Vancomycin  Home Medications   Prior to  Admission medications   Medication Sig Start Date End Date Taking? Authorizing Provider  ALPRAZolam (XANAX) 0.25 MG tablet TAKE 1/2 TO 1 TABLET BY MOUTH TWICE A DAY AS NEEDED FOR ANXIETY OR SLEEP Patient taking differently: Take 0.125-0.25 mg by mouth 2 (two) times daily as needed for anxiety or sleep. TAKE 1/2 TO 1 TABLET BY MOUTH TWICE A DAY AS NEEDED FOR ANXIETY OR SLEEP 06/25/15  Yes Marin Olp, MD  amLODipine-valsartan (EXFORGE) 5-160 MG tablet Take 1 tablet by mouth daily. 05/10/15  Yes Marin Olp, MD  aspirin 81 MG tablet Take 81 mg by mouth daily.    Yes Historical Provider, MD  cetirizine (ZYRTEC) 10 MG tablet Take 10 mg by mouth daily.    Yes Historical  Provider, MD  clotrimazole (LOTRIMIN) 1 % cream Apply 1 application topically 2 (two) times daily. As needed for fungal skin infections 04/02/15  Yes Marin Olp, MD  FIBER PO Take 2 tablets by mouth every morning.    Yes Historical Provider, MD  fluticasone (FLONASE) 50 MCG/ACT nasal spray Place 2 sprays into both nostrils daily. 01/11/15  Yes Chesley Mires, MD  hydrochlorothiazide (HYDRODIURIL) 12.5 MG tablet Take 1 tablet (12.5 mg total) by mouth daily. 05/10/15  Yes Marin Olp, MD  isometheptene-acetaminophen-dichloralphenazone (MIDRIN) [phone] MG capsule Take 1 capsule by mouth daily as needed for migraine. 04/02/15  Yes Marin Olp, MD  nystatin (MYCOSTATIN/NYSTOP) 100000 UNIT/GM POWD APPLY TO AFFECTED AREA TWICE A DAY 11/25/14  Yes Marin Olp, MD  Omega-3 Fatty Acids (FISH OIL) 1200 MG CAPS Take 2,400 mg by mouth daily.    Yes Historical Provider, MD  omeprazole (PRILOSEC) 20 MG capsule TAKE 1 CAPSULE (20 MG TOTAL) BY MOUTH 2 (TWO) TIMES DAILY. 11/06/14  Yes Marin Olp, MD  potassium chloride (MICRO-K) 10 MEQ CR capsule Take 4 capsules (40 mEq total) by mouth daily. Patient taking differently: Take 20 mEq by mouth 2 (two) times daily.  05/10/15  Yes Minus Breeding, MD  SYNTHROID 200 MCG tablet Take 1 tablet (200 mcg total) by mouth daily before breakfast. 04/02/15  Yes Marin Olp, MD  tamsulosin (FLOMAX) 0.4 MG CAPS capsule Take 1 capsule (0.4 mg total) by mouth daily. 07/03/15  Yes Robyn Haber, MD  torsemide (DEMADEX) 20 MG tablet Take 1 tablet (20 mg total) by mouth daily. 04/30/15  Yes Minus Breeding, MD   BP 141/68 mmHg  Pulse 98  Temp(Src) 97.7 F (36.5 C) (Oral)  Resp 17  SpO2 96% Physical Exam  Constitutional: She is oriented to person, place, and time. She appears well-developed and well-nourished. No distress.  Laying in bed. NAD.   HENT:  Head: Normocephalic and atraumatic.  Eyes: Conjunctivae are normal. Right eye exhibits no discharge.  Left eye exhibits no discharge.  Neck: Neck supple.  Cardiovascular: Normal rate, regular rhythm and normal heart sounds.  Exam reveals no gallop and no friction rub.   No murmur heard. Pulmonary/Chest: Effort normal. No respiratory distress.  Decreased breath sounds b/l. No adventitious breath sounds.   Abdominal: Soft. She exhibits no distension. There is no tenderness.  Musculoskeletal: She exhibits no edema or tenderness.  Neurological: She is alert and oriented to person, place, and time. No cranial nerve deficit. She exhibits normal muscle tone. Coordination normal.  Skin: Skin is warm and dry.  Psychiatric: She has a normal mood and affect. Her behavior is normal. Thought content normal.  Nursing note and vitals reviewed.   ED Course  Procedures (including critical care time) Labs Review Labs Reviewed  CBC WITH DIFFERENTIAL/PLATELET - Abnormal; Notable for the following:    Platelets 145 (*)    All other components within normal limits  BASIC METABOLIC PANEL - Abnormal; Notable for the following:    Glucose, Bld 127 (*)    Calcium 8.3 (*)    All other components within normal limits  TROPONIN I    Imaging Review Dg Chest 2 View  07/26/2015  CLINICAL DATA:  Increasing shortness of breath and weakness starting at 4 a.m. History of right breast cancer. Hypertension. Oxygen dependent. EXAM: CHEST  2 VIEW COMPARISON:  07/02/2015 FINDINGS: Mild enlargement of the cardiopericardial silhouette. Faint Kerley B-lines are present but the overall pattern of edema is improved from 07/02/2015. Right axillary and right chest clips noted. Dextro foot convex thoracic scoliosis. Chronic scarring in the right upper lobe not appreciably changed from 11/03/2014. Asymmetric degenerative right sternoclavicular arthropathy. Thoracic spondylosis. IMPRESSION: 1. Mild interstitial edema, although improved from 07/02/2015. Mild enlargement of the cardiopericardial silhouette. 2. Chronic scarring, right upper  lobe. Electronically Signed   By: Van Clines M.D.   On: 07/26/2015 08:14   I have personally reviewed and evaluated these images and lab results as part of my medical decision-making.   EKG Interpretation   Date/Time:  Monday July 26 2015 05:50:01 EST Ventricular Rate:  94 PR Interval:  167 QRS Duration: 97 QT Interval:  354 QTC Calculation: 443 R Axis:   8 Text Interpretation:  Sinus rhythm Abnormal R-wave progression, early  transition No significant change since last tracing Confirmed by [place]   MD, Amit Meloy ([id number]) on 07/26/2015 7:05:53 AM      MDM   Final diagnoses:  Dyspnea    84yF who woke up in the middle of the night and felt like she could not breath. Currently no complaints. Reports history of similar episodes. All have been while sleeping. I'm not sure what specifically is triggering these events. They are understandably very distressing to her, but I have a low suspicion for an emergent process.     Virgel Manifold, MD 07/27/15 [phone]

## 2015-07-28 ENCOUNTER — Other Ambulatory Visit: Payer: Self-pay

## 2015-07-28 DIAGNOSIS — I10 Essential (primary) hypertension: Secondary | ICD-10-CM

## 2015-07-28 NOTE — Patient Outreach (Addendum)
[facility]) Care Management  07/28/2015  Tamara Morales [birth year] [id number]   Referral Date:  07/27/2015 Source:  Assumption call date:  07/27/2015  Issue:  Panic Attack  Providers: Primary MD: Dr. Marin Olp.  Last appt 04/02/15   Changing to NEW Primary MD:  Dr. Jeanmarie Hubert: initial appt 07/29/15 with NP due to Dr. Nyoka Cowden provides on-site services with [facility].   H/o Rowe Clack prior to seeing Dr. Yong Channel.  Transitioned to Dr. Yong Channel due to Dr. Asa Lente cutting back on work hours.  Pulmonologist:  Chesley Mires, MD  CPAP management Cardiologist:  Minus Breeding, MD - last appt 04/30/15 - Lymphedema management Dentist:  Last appt summer 2016 Eye MD: summer 2016 - h/o cataract removal    HH:  None - [facility] Nurses available 24/7 on site. Insurance: Medicare and [employer].   Social: 82 yo married patient lives in [place] ([place]) since 2011.   Patient's husband resides in skilled care.   Patient moved to [facility] in 2011 to [facility] from [place], [place].  States her husband was a Pharmacist, community and following his retirement; they lived on a sail boat for 3 years before settling on [place], [place].   Mobility: Ambulates with a cane and able to stand for a short period of time due to issues with lymphedema and being  Overweight.  H/o  bilateral fractured legs 10+ years ago associated to a fall in the shower and broke both legs during a time of knee replacement recovery. Patient feels her ambulation has never returned to where it was since that event.  Falls: none  Pain:  Kidney stone about 6 weeks ago. OA lower back managed with Ibuprofen or Tylenol.   Transportation:  Self Caregiver:  Daughter, Richrd Prime, [place], [place] - (drives a school bus); limited time to assist.  Advanced Directive:  LW, HCPOA, DNR  and plans to update on new PCP appt scheduled for 07/29/2015.   Resources: None   DME: power chair for distance, 2 canes for close, CPAP 6-7 years from [place].  (not as good as it once was in terms of humidifier).  Patient does not like newer one provided by [facility].    THN conditions:   Other:  HTN, Lymphedema lower legs, Obesity, Panic Attacks Admissions: 1 ER visits: 1  ED Utilization for Panic Attack 07/26/2015 Duration:  2-6 hours  Frequency:  Estimates at 1 per month average over the past 8-10 years.  MD and patient have decided patient has been having panic attacks for a number of years but was not diagnosed.  Patient states past doctors from years ago would ask her "how do you care for your children if your attacks are as bad as you say they are."  Non-smoker Alcohol - no; states gave up years ago.  Caffeine:  No coffee or tea; no more than one Coke per week.  Current Medication management:  Xanax ordered for anxiety or sleep as needed.  Patient states she only uses on as needed basis and not daily.   Patient states she is not able to identify any causes of her anxiety attacks and confirms she was having prior to her husband being placed in skilled care and living alone.   Obesity Weight currently at 290 and h/o as high as 315.  (healthy weight goal:  Not set and ST Goal is to just be able  to reduce by any number.).   Patient states she gained 35 lbs within the first 2 years of living at [facility].   States the residents C/O the unhealthy foods prepared which have too much salt, sugar and unknown ingredients such as in casseroles.  States the long term chef plans retirement at the end of 2017 and hoping the meals will get healthier.    (H/o weight 310 on 06/27/2015 [facility] MR.) (H/o Nutritionist, Estelle Grumbles, [phone] previously recommended by Dr Percival Spanish per Epic note).   Medications:  Patient taking more than 10 medications  Co-pay cost issue: no  Flu Vaccine: yes Pneumonia Vaccine:  PCV13 03/12/2014 and PPS 06/19/2006  Plan:  Screening and Initial  Assessment date:  07/28/2015 Program:  Other:  HTN Telephonic RN CM date:  07/28/2015  [place] Referral -more than 10 medications   Patient high risk for falls.  HTN, anxiety and low salt diet education needed.  Advance Directive update pending.  RN CM provided education on self management of anxiety symptoms. RN CM will notify Dr. Rolly Salter office to follow-up on anxiety symptoms on 07/29/2015 scheduled apt.  -h/o ED utilization 07/26/2015 for panic attack RN CM will continue to follow-up on Advance Directive updates.  Emmo Education mailed 07/28/2015 -Panic Disorder -Preventing Falls - What To Ask Your Doctor -Low-Salt Diet    RN CM notified [place] Care Management Assistant: agreed to services/case opened. RN CM sent successful outreach letter and  [place] Introductory package. RN CM sent Physician Enrollment/Barriers Letter and Initial Assessment to Primary MD RN CM scheduled next contact call within the next 30 days and care coordination services as needed.   RN CM advised to please notify MD of any changes in condition prior to scheduled apt'S.   RN CM provided contact name and # [phone] or main office # [phone] and 24-hour nurse line # [phone].  RN CM confirmed patient is aware of 911 services for urgent emergency needs.  Mariann Laster, RN, BSN, [facility], CCM  [employer] Management Coordinator [phone] Direct [phone] Cell [phone] Office [phone] Fax

## 2015-07-28 NOTE — Patient Outreach (Signed)
[facility]) Care Management  07/28/2015  Tamara Morales 01-05-1931 [id number]   Care Coordination Services.   Contact call to Dr. Rolly Salter office.   Issue:  Patient has pending Initial visit scheduled for 07/29/2015 at 2pm.   Patient needs medication management plan for long term history of panic attacks.  ED Utilization for Panic Attack 07/26/2015 due to patient only has Xanax which is only used as needed.   Office contact instructs to notify [facility] where visits are scheduled.  RN CM contacted [place] who confirm that patient resides at [facility]) location.  Contact notified of the above notification.  Contact confirms [facility] has access to [employer] and can view notes.   Mariann Laster, RN, BSN, [facility], CCM  [employer] Management Coordinator [phone] Direct [phone] Cell [phone] Office [phone] Fax

## 2015-07-28 NOTE — Addendum Note (Signed)
Addended by: Standley Brooking on: 07/28/2015 02:56 PM   Modules accepted: Orders

## 2015-07-29 ENCOUNTER — Encounter: Payer: Self-pay | Admitting: Nurse Practitioner

## 2015-07-29 ENCOUNTER — Non-Acute Institutional Stay: Payer: Medicare Other | Admitting: Nurse Practitioner

## 2015-07-29 VITALS — BP 102/84 | HR 84 | Temp 97.9°F | Ht 63.0 in | Wt 294.0 lb

## 2015-07-29 DIAGNOSIS — F418 Other specified anxiety disorders: Secondary | ICD-10-CM

## 2015-07-29 DIAGNOSIS — K219 Gastro-esophageal reflux disease without esophagitis: Secondary | ICD-10-CM | POA: Diagnosis not present

## 2015-07-29 DIAGNOSIS — E039 Hypothyroidism, unspecified: Secondary | ICD-10-CM | POA: Diagnosis not present

## 2015-07-29 DIAGNOSIS — I10 Essential (primary) hypertension: Secondary | ICD-10-CM | POA: Diagnosis not present

## 2015-07-29 DIAGNOSIS — F329 Major depressive disorder, single episode, unspecified: Secondary | ICD-10-CM

## 2015-07-29 DIAGNOSIS — E876 Hypokalemia: Secondary | ICD-10-CM

## 2015-07-29 DIAGNOSIS — I890 Lymphedema, not elsewhere classified: Secondary | ICD-10-CM | POA: Diagnosis not present

## 2015-07-29 DIAGNOSIS — F419 Anxiety disorder, unspecified: Secondary | ICD-10-CM

## 2015-07-29 DIAGNOSIS — F32A Depression, unspecified: Secondary | ICD-10-CM

## 2015-07-29 MED ORDER — ALPRAZOLAM 0.25 MG PO TABS: ORAL_TABLET | ORAL | Status: DC

## 2015-07-29 MED ORDER — CITALOPRAM HYDROBROMIDE 10 MG PO TABS: ORAL_TABLET | ORAL | Status: DC

## 2015-07-29 NOTE — Progress Notes (Signed)
Patient ID: Tamara Morales, female   DOB: 09/23/1930, 82 y.o.   MRN: [id number]  Location:  clinic [facility] Provider:  Chipper Oman NP  Code Status:  DNR Goals of care: Advanced Directive information Does patient have an advance directive?: No, Would patient like information on creating an advanced directive?: Yes - Educational materials given  Chief Complaint  Patient presents with  . OTHER    NP to establish with Practice. Resident has been having panic attacks for years and needs medication regimen. patient wants MOST Form and DNR completed.   . OTHER    Amy, daughter with patient today     HPI: Patient is a 82 y.o. female seen in the clinic  at [facility] today for evaluation of insomnia. Hx of Xanax use for anxiety/depression, held while the acute encephalopathy, resumed now. Hx of Celexa for anxiety and depression, stated it helped with early am awake, but stopped taking it by herself. Urgent care evaluation 07/03/15 R flank pain, Flomax 0.4mg  started. Denied R flank or abd pain today. Takes HCTZ and Furosemide for BLE edema, taking Kcl daily, but it may related to her nausea before lunch, K 4.3, Bun 19, creat 0.85 07/13/15.     Hospitalized 06/27/15 to 07/02/15 R Ureteral stone with hydronephrosis, has Urology consultation, will complete antibiotic course (Augmentin) on 07/06/2015 for UTI (urinary tract infection) and PNA. F/u Dr. Berneice Heinrich in 6 weeks. CT renal study showed 5 mm partially obstructing right ureteral stone.  underwent cystoscopy with right retrograde pyelogram, right ureteroscopy with laser lithotripsy and insertion of right ureteral stent. 1/11 became acutely encephalopathic and hypoxia with O2 sat in the 40s. Placed on face mask improved o2 sat. X-ray done showed questionable right middle lobe infiltrate       Review of Systems  Constitutional:       Obese  HENT: Negative for congestion, ear pain, hearing loss, sore throat and tinnitus.   Eyes: Negative for  photophobia, pain and redness.  Respiratory: Negative for cough and shortness of breath.   Cardiovascular: Positive for leg swelling. Negative for chest pain and palpitations.       Chronic BLE, dermatosclerosis changes  Gastrointestinal: Negative for nausea, vomiting, abdominal pain, diarrhea, constipation and blood in stool.  Genitourinary: Positive for frequency. Negative for dysuria, urgency, hematuria and flank pain.       The right   Musculoskeletal: Positive for back pain. Negative for myalgias.  Skin: Negative for rash.  Neurological: Negative for dizziness, weakness and headaches.  Psychiatric/Behavioral: Positive for depression. The patient is nervous/anxious and has insomnia.     Past Medical History  Diagnosis Date  . Morbid obesity (HCC)   . MIGRAINE HEADACHE   . ARTHRITIS, KNEES, BILATERAL   . DEPRESSION   . GERD   . HYPERLIPIDEMIA   . HYPERTENSION   . DJD (degenerative joint disease) of knee   . Urge incontinence   . HYPOTHYROIDISM     postsurgical  . OSA (obstructive sleep apnea) 01/17/2011 dx  . Hypoxia   . Oxygen dependent   . Breast CA (HCC) 2000    s/p R lumpectomy and XRT  . UTI (lower urinary tract infection) 12/2014    Patient Active Problem List   Diagnosis Date Noted  . Hypokalemia 07/07/2015  . Acid reflux 07/05/2015  . Lobar pneumonia (HCC) 07/02/2015  . Lymphedema of both lower extremities 07/02/2015  . UTI (urinary tract infection) 07/02/2015  . Encephalopathy   . Ureterolithiasis   .  Ureteral stone with hydronephrosis 06/28/2015  . Acute renal failure superimposed on stage 4 chronic kidney disease (HCC) 06/27/2015  . Right ureteral calculus 06/27/2015  . Anxiety and depression 06/27/2015  . History of breast cancer 06/27/2015  . Abdominal pain, RLQ (right lower quadrant) 06/27/2015  . AKI (acute kidney injury) (HCC)   . Obesity hypoventilation syndrome (HCC) 11/27/2014  . Morbid obesity (HCC) 10/04/2009  . Hypothyroidism 08/05/2009  .  Essential hypertension 08/05/2009    Allergies  Allergen Reactions  . Azithromycin Itching  . Beta Adrenergic Blockers     Depression   . Ciprofloxacin Other (See Comments)    REACTION: edgy and very jumpy   . Codeine Nausea And Vomiting  . Epinephrine Other (See Comments)    REACTION: rapid pulse, sweats  . Klonopin [Clonazepam] Other (See Comments)    Makes her feel very suicidal   . Oxycodone Other (See Comments)    Patient felt like it altered her mental status "Crazy" . Hallucinations later as well.  [name] Paxil [Paroxetine Hydrochloride] Other (See Comments)    Sever depression   . Prednisone     "makes me feel really bad"  . Propoxyphene Hcl Nausea And Vomiting  . Tramadol Other (See Comments)    Feels "jittery"  . Meloxicam Diarrhea  . Vancomycin Rash    Localized rash related to infusion rate.    Medications: Patient's Medications  New Prescriptions   CITALOPRAM (CELEXA) 10 MG TABLET    Take one tablet by mouth once daily for depression  Previous Medications   AMLODIPINE-VALSARTAN (EXFORGE) 5-160 MG TABLET    Take 1 tablet by mouth daily.   ASPIRIN 81 MG TABLET    Take 81 mg by mouth daily.    CETIRIZINE (ZYRTEC) 10 MG TABLET    Take 10 mg by mouth daily.    CLOTRIMAZOLE (LOTRIMIN) 1 % CREAM    Apply 1 application topically 2 (two) times daily. As needed for fungal skin infections   FIBER PO    Take 2 tablets by mouth every morning.    FLUTICASONE (FLONASE) 50 MCG/ACT NASAL SPRAY    Place 2 sprays into both nostrils daily.   HYDROCHLOROTHIAZIDE (HYDRODIURIL) 12.5 MG TABLET    Take 1 tablet (12.5 mg total) by mouth daily.   ISOMETHEPTENE-ACETAMINOPHEN-DICHLORALPHENAZONE (MIDRIN) 65-100-325 MG CAPSULE    Take 1 capsule by mouth daily as needed for migraine.   NYSTATIN (MYCOSTATIN/NYSTOP) 100000 UNIT/GM POWD    APPLY TO AFFECTED AREA TWICE A DAY   OMEGA-3 FATTY ACIDS (FISH OIL) 1200 MG CAPS    Take 2,400 mg by mouth daily.    OMEPRAZOLE (PRILOSEC) 20 MG CAPSULE    TAKE  1 CAPSULE (20 MG TOTAL) BY MOUTH 2 (TWO) TIMES DAILY.   POTASSIUM CHLORIDE (MICRO-K) 10 MEQ CR CAPSULE    Take 4 capsules (40 mEq total) by mouth daily.   SYNTHROID 200 MCG TABLET    Take 1 tablet (200 mcg total) by mouth daily before breakfast.   TAMSULOSIN (FLOMAX) 0.4 MG CAPS CAPSULE    Take 1 capsule (0.4 mg total) by mouth daily.   TORSEMIDE (DEMADEX) 20 MG TABLET    Take 1 tablet (20 mg total) by mouth daily.  Modified Medications   Modified Medication Previous Medication   ALPRAZOLAM (XANAX) 0.25 MG TABLET ALPRAZolam (XANAX) 0.25 MG tablet      Take one tablet by mouth twice daily as needed for anxiety    TAKE 1/2 TO 1 TABLET BY MOUTH TWICE A DAY  AS NEEDED FOR ANXIETY OR SLEEP  Discontinued Medications   No medications on file    Physical Exam: Filed Vitals:   07/29/15 1408  BP: 102/84  Pulse: 84  Temp: 97.9 F (36.6 C)  TempSrc: Oral  Height: 5\' 3"  (1.6 m)  Weight: 294 lb (133.358 kg)  SpO2: 97%   Body mass index is 52.09 kg/(m^2).  Physical Exam  Constitutional: She is oriented to person, place, and time. She appears well-developed and well-nourished. She is active and cooperative. No distress.  BP 144/81 mmHg  Pulse 87  Temp(Src) 98 F (36.7 C) (Oral)  Resp 17  SpO2 95%  HENT:  Head: Normocephalic and atraumatic.  Right Ear: Hearing and external ear normal.  Left Ear: Hearing and external ear normal.  Nose: Nose normal.  Mouth/Throat: Oropharynx is clear and moist.  Eyes: Conjunctivae are normal. No scleral icterus.  Neck: Normal range of motion. Neck supple. No thyromegaly present.  Cardiovascular: Normal rate, regular rhythm, normal heart sounds and intact distal pulses.   Pulses:      Radial pulses are 2+ on the right side, and 2+ on the left side.       Dorsalis pedis pulses are 1+ on the right side, and 1+ on the left side.  Pulmonary/Chest: Effort normal and breath sounds normal.  Abdominal: Soft. There is no tenderness.  Musculoskeletal: She exhibits  edema.       Lumbar back: She exhibits decreased range of motion and pain. She exhibits no tenderness, no bony tenderness, no swelling and no edema.       Back:  Electric w/c for mobility Chronic BLE dermatosclerosis, trace edema   Lymphadenopathy:       Head (right side): No tonsillar, no preauricular, no posterior auricular and no occipital adenopathy present.       Head (left side): No tonsillar, no preauricular, no posterior auricular and no occipital adenopathy present.    She has no cervical adenopathy.       Right: No supraclavicular adenopathy present.       Left: No supraclavicular adenopathy present.  Both lower legs with substantial swelling and skin changes consistent with the chronic swelling.  Neurological: She is alert and oriented to person, place, and time. No sensory deficit.  Skin: Skin is warm, dry and intact. No cyanosis or erythema. Nails show no clubbing.  Psychiatric: She has a normal mood and affect. Her speech is normal and behavior is normal.    Labs reviewed: Basic Metabolic Panel:  Recent Labs  16/10/96 0500  06/29/15 0430 06/30/15 0435 07/06/15 07/13/15 07/26/15 0751  NA 140  < > 140 140 141 140 140  K 3.7  < > 3.9 4.2 2.9* 4.3 3.6  CL 107  < > 106 105  --   --  101  CO2 27  < > 27 29  --   --  27  GLUCOSE 106*  < > 163* 113*  --   --  127*  BUN 8  < > 22* 16 23* 19 15  CREATININE 0.73  < > 1.12* 1.00 1.2* 0.8 0.80  CALCIUM 7.4*  < > 7.4* 7.6*  --   --  8.3*  MG 1.8  --   --   --   --   --   --   < > = values in this interval not displayed.  Liver Function Tests:  Recent Labs  06/28/15 0446 06/29/15 0430 06/30/15 0435 07/06/15  AST 40 33 49*  12*  ALT 33 28 32 15  ALKPHOS 90 79 88 76  BILITOT 0.7 0.6 0.6  --   PROT 6.3* 5.7* 6.1*  --   ALBUMIN 3.1* 2.8* 2.8*  --     CBC:  Recent Labs  06/29/15 0430 06/30/15 0435 07/06/15 07/26/15 0751  WBC 8.7 7.3 8.5 6.3  NEUTROABS 7.0 5.4  --  4.5  HGB 10.9* 11.4* 12.0 12.0  HCT 35.5* 37.1  35* 36.3  MCV 93.4 93.7  --  88.3  PLT 135* 137* 219 145*    Lab Results  Component Value Date   TSH 0.768 06/23/2015   Lab Results  Component Value Date   HGBA1C 6.2 06/23/2015   Lab Results  Component Value Date   CHOL 172 07/29/2014   HDL 42.30 07/29/2014   LDLCALC 98 07/29/2014   LDLDIRECT 99.4 03/13/2012   TRIG 159.0* 07/29/2014   CHOLHDL 4 07/29/2014    Significant Diagnostic Results since last visit: none  Patient Care Team: Shelva Majestic, MD as PCP - General (Family Medicine) Wendall Stade, MD as Consulting Physician (Cardiology) Shea Evans, MD as Consulting Physician (Obstetrics and Gynecology) Coralyn Helling, MD as Consulting Physician (Pulmonary Disease) Alinda Money, RN as [facility] Care Management  Assessment/Plan Problem List Items Addressed This Visit    Hypothyroidism    Continue Synthyroid , last TSH 0.768 07/03/15.       Essential hypertension    Controlled, continue Amlodipine Valsartan 5/160mg  daily. HCTZ 12.5mg  daily. Torsemide 20mg . 07/22/15 Na 140, K 4.3, Bun 19, creat 0.85      Anxiety and depression    Will resume Celexa 10mg  daily, Alprazolam 0.125mg  bid prn. Observe the patient.       Lymphedema of both lower extremities    Chronic 2+, Continue HCTZ, Torsemide.       Acid reflux    Stable, continue Omeprazole       Hypokalemia - Primary    K 4.3 07/13/15, continue Kcl daily.           Family/ staff Communication: continue IL   Labs/tests ordered:  BMP done 07/13/15  [facility] Cj Edgell NP Geriatrics [facility] [address], [place] [address] On Call:  [phone] & follow prompts after 5pm & weekends Office Phone:  [phone] Office Fax:  [phone]

## 2015-07-29 NOTE — Assessment & Plan Note (Signed)
Chronic 2+, Continue HCTZ, Torsemide.

## 2015-07-29 NOTE — Assessment & Plan Note (Signed)
Controlled, continue Amlodipine Valsartan 5/160mg  daily. HCTZ 12.5mg  daily. Torsemide 20mg . 07/22/15 Na 140, K 4.3, Bun 19, creat 0.85

## 2015-07-29 NOTE — Assessment & Plan Note (Signed)
Continue Synthyroid 280mcg, last TSH 0.768 07/03/15.

## 2015-07-29 NOTE — Assessment & Plan Note (Signed)
Stable, continue Omeprazole.  

## 2015-07-29 NOTE — Assessment & Plan Note (Signed)
Will resume Celexa 10mg  daily, Alprazolam 0.125mg  bid prn. Observe the patient.

## 2015-07-29 NOTE — Assessment & Plan Note (Signed)
K 4.3 07/13/15, continue Kcl 39meq daily.

## 2015-08-02 DIAGNOSIS — R2681 Unsteadiness on feet: Secondary | ICD-10-CM | POA: Diagnosis not present

## 2015-08-02 DIAGNOSIS — I890 Lymphedema, not elsewhere classified: Secondary | ICD-10-CM | POA: Diagnosis not present

## 2015-08-02 DIAGNOSIS — M25551 Pain in right hip: Secondary | ICD-10-CM | POA: Diagnosis not present

## 2015-08-02 DIAGNOSIS — M6281 Muscle weakness (generalized): Secondary | ICD-10-CM | POA: Diagnosis not present

## 2015-08-02 DIAGNOSIS — R601 Generalized edema: Secondary | ICD-10-CM | POA: Diagnosis not present

## 2015-08-04 DIAGNOSIS — M25551 Pain in right hip: Secondary | ICD-10-CM | POA: Diagnosis not present

## 2015-08-04 DIAGNOSIS — I890 Lymphedema, not elsewhere classified: Secondary | ICD-10-CM | POA: Diagnosis not present

## 2015-08-04 DIAGNOSIS — R601 Generalized edema: Secondary | ICD-10-CM | POA: Diagnosis not present

## 2015-08-04 DIAGNOSIS — R2681 Unsteadiness on feet: Secondary | ICD-10-CM | POA: Diagnosis not present

## 2015-08-04 DIAGNOSIS — M6281 Muscle weakness (generalized): Secondary | ICD-10-CM | POA: Diagnosis not present

## 2015-08-06 DIAGNOSIS — I890 Lymphedema, not elsewhere classified: Secondary | ICD-10-CM | POA: Diagnosis not present

## 2015-08-06 DIAGNOSIS — R2681 Unsteadiness on feet: Secondary | ICD-10-CM | POA: Diagnosis not present

## 2015-08-06 DIAGNOSIS — M6281 Muscle weakness (generalized): Secondary | ICD-10-CM | POA: Diagnosis not present

## 2015-08-06 DIAGNOSIS — M25551 Pain in right hip: Secondary | ICD-10-CM | POA: Diagnosis not present

## 2015-08-06 DIAGNOSIS — R601 Generalized edema: Secondary | ICD-10-CM | POA: Diagnosis not present

## 2015-08-09 ENCOUNTER — Encounter: Payer: Self-pay | Admitting: Nurse Practitioner

## 2015-08-09 DIAGNOSIS — R2681 Unsteadiness on feet: Secondary | ICD-10-CM | POA: Diagnosis not present

## 2015-08-09 DIAGNOSIS — M6281 Muscle weakness (generalized): Secondary | ICD-10-CM | POA: Diagnosis not present

## 2015-08-09 DIAGNOSIS — R601 Generalized edema: Secondary | ICD-10-CM | POA: Diagnosis not present

## 2015-08-09 DIAGNOSIS — I890 Lymphedema, not elsewhere classified: Secondary | ICD-10-CM | POA: Diagnosis not present

## 2015-08-09 DIAGNOSIS — M25551 Pain in right hip: Secondary | ICD-10-CM | POA: Diagnosis not present

## 2015-08-13 ENCOUNTER — Other Ambulatory Visit: Payer: Self-pay | Admitting: Pharmacist

## 2015-08-13 NOTE — Patient Outreach (Signed)
[facility]) Care Management  [facility]   08/13/2015  Tamara Morales September 28, 1930 [id number]  Subjective: Tamara Morales is a 82yo who was referred to [facility] for medication review.    Objective:   Current Medications: Current Outpatient Prescriptions  Medication Sig Dispense Refill  . ALPRAZolam (XANAX) 0.25 MG tablet Take one tablet by mouth twice daily as needed for anxiety 60 tablet 4  . amLODipine-valsartan (EXFORGE) 5-160 MG tablet Take 1 tablet by mouth daily. 90 tablet 1  . aspirin 81 MG tablet Take 81 mg by mouth daily.     . cetirizine (ZYRTEC) 10 MG tablet Take 10 mg by mouth daily.     . citalopram (CELEXA) 10 MG tablet Take one tablet by mouth once daily for depression 30 tablet 0  . clotrimazole (LOTRIMIN) 1 % cream Apply 1 application topically 2 (two) times daily. As needed for fungal skin infections 113 g 1  . FIBER PO Take 2 tablets by mouth every morning.     . fluticasone (FLONASE) 50 MCG/ACT nasal spray Place 2 sprays into both nostrils daily. 16 g 2  . hydrochlorothiazide (HYDRODIURIL) 12.5 MG tablet Take 1 tablet (12.5 mg total) by mouth daily. 90 tablet 1  . isometheptene-acetaminophen-dichloralphenazone (MIDRIN) 65-100-325 MG capsule Take 1 capsule by mouth daily as needed for migraine. 30 capsule 0  . nystatin (MYCOSTATIN/NYSTOP) 100000 UNIT/GM POWD APPLY TO AFFECTED AREA TWICE A DAY 120 g 2  . Omega-3 Fatty Acids (FISH OIL) 1200 MG CAPS Take 2,400 mg by mouth daily.     [name] omeprazole (PRILOSEC) 20 MG capsule TAKE 1 CAPSULE (20 MG TOTAL) BY MOUTH 2 (TWO) TIMES DAILY. 180 capsule 3  . potassium chloride (MICRO-K) 10 MEQ CR capsule Take 4 capsules (40 mEq total) by mouth daily. (Patient taking differently: Take 20 mEq by mouth 2 (two) times daily. ) 120 capsule 6  . SYNTHROID 200 MCG tablet Take 1 tablet (200 mcg total) by mouth daily before breakfast. 90 tablet 3  . tamsulosin (FLOMAX) 0.4 MG CAPS capsule Take 1 capsule (0.4  mg total) by mouth daily. 7 capsule 3  . torsemide (DEMADEX) 20 MG tablet Take 1 tablet (20 mg total) by mouth daily. 30 tablet 6   No current facility-administered medications for this visit.    Functional Status: In your present state of health, do you have any difficulty performing the following activities: 07/28/2015 06/27/2015  Hearing? N -  Vision? N -  Difficulty concentrating or making decisions? N -  Walking or climbing stairs? Y -  Dressing or bathing? N -  Doing errands, shopping? Malvin Johns  Preparing Food and eating ? Y -  Using the Toilet? N -  In the past six months, have you accidently leaked urine? N -  Do you have problems with loss of bowel control? N -  Managing your Medications? N -  Managing your Finances? N -  Housekeeping or managing your Housekeeping? Y -    Fall/Depression Screening: PHQ 2/9 Scores 07/29/2015 07/28/2015 07/03/2015 06/23/2015 07/29/2014  PHQ - 2 Score 1 0 0 0 0    Assessment:  Drugs sorted by system:  Neurologic/Psychologic: alprazolam, citalopram  Cardiovascular: amlodipine-valsartan, aspirin, hydrochlorothiazide, omega-3 fatty acids, potassium, torsemide  Pulmonary/Allergy: cetirizine, fluticasone  Gastrointestinal: omeprazole  Endocrine: levothyroxine  Renal: none  Topical: clotrimazole cream, nystatin powder  Pain: isometheptene-dichloral-acetaminophen  Vitamins/Minerals: none  Infectious Diseases: none  Miscellaneous: fiber, tamsulosin   Duplications in therapy: none noted Gaps in therapy:  none noted Medications to avoid in the elderly: alprazolam (increase risk of cognitive impairment, delirium, falls, fractures, and motor vehicle crashes in older adults), omeprazole (risk of Clostridium difficile  Infection and bone loss and fractures) Drug interactions: none noted Other issues noted: none - patient is on medications that can elevate potassium and on a potassium supplement. Last potassium = 3.6 (07/26/15)   Plan: 1.   Medication review:  Please use caution with medications on the beers list.  The beers list recommends to avoid scheduled use of proton pump inhibitors for greater than 8 weeks.  Could consider trial of H2 blocker as alternative to long term use of proton pump inhibitor.  Will send a fax to patient's PCP with this information.  No other issues noted.  Will close pharmacy program.     Elisabeth Most, Pharm.D. Pharmacy Resident [place] [phone]

## 2015-08-14 ENCOUNTER — Encounter: Payer: Self-pay | Admitting: Pharmacist

## 2015-08-16 ENCOUNTER — Other Ambulatory Visit: Payer: Self-pay | Admitting: *Deleted

## 2015-08-16 DIAGNOSIS — M6281 Muscle weakness (generalized): Secondary | ICD-10-CM | POA: Diagnosis not present

## 2015-08-16 DIAGNOSIS — R601 Generalized edema: Secondary | ICD-10-CM | POA: Diagnosis not present

## 2015-08-16 DIAGNOSIS — N3946 Mixed incontinence: Secondary | ICD-10-CM | POA: Diagnosis not present

## 2015-08-16 DIAGNOSIS — R2681 Unsteadiness on feet: Secondary | ICD-10-CM | POA: Diagnosis not present

## 2015-08-16 DIAGNOSIS — I890 Lymphedema, not elsewhere classified: Secondary | ICD-10-CM | POA: Diagnosis not present

## 2015-08-16 DIAGNOSIS — N200 Calculus of kidney: Secondary | ICD-10-CM | POA: Diagnosis not present

## 2015-08-16 DIAGNOSIS — Z0000 Encounter for general adult medical examination without abnormal findings: Secondary | ICD-10-CM | POA: Diagnosis not present

## 2015-08-16 DIAGNOSIS — M25551 Pain in right hip: Secondary | ICD-10-CM | POA: Diagnosis not present

## 2015-08-16 MED ORDER — POTASSIUM CHLORIDE ER 10 MEQ PO CPCR: ORAL_CAPSULE | ORAL | Status: DC

## 2015-08-16 NOTE — Telephone Encounter (Signed)
Per [facility] last OV note patient is taking 86meq of potassium daily. [facility] requested updated Rx.

## 2015-08-17 ENCOUNTER — Encounter: Payer: Self-pay | Admitting: Pulmonary Disease

## 2015-08-18 ENCOUNTER — Other Ambulatory Visit: Payer: Self-pay | Admitting: Pulmonary Disease

## 2015-08-18 DIAGNOSIS — Z9989 Dependence on other enabling machines and devices: Secondary | ICD-10-CM

## 2015-08-18 DIAGNOSIS — M6281 Muscle weakness (generalized): Secondary | ICD-10-CM | POA: Diagnosis not present

## 2015-08-18 DIAGNOSIS — R601 Generalized edema: Secondary | ICD-10-CM | POA: Diagnosis not present

## 2015-08-18 DIAGNOSIS — R2681 Unsteadiness on feet: Secondary | ICD-10-CM | POA: Diagnosis not present

## 2015-08-18 DIAGNOSIS — M25551 Pain in right hip: Secondary | ICD-10-CM | POA: Diagnosis not present

## 2015-08-18 DIAGNOSIS — I890 Lymphedema, not elsewhere classified: Secondary | ICD-10-CM | POA: Diagnosis not present

## 2015-08-18 DIAGNOSIS — G4733 Obstructive sleep apnea (adult) (pediatric): Secondary | ICD-10-CM

## 2015-08-18 NOTE — Telephone Encounter (Signed)
Below is a pt email sent to [facility]: ------------------------------------ Dr. Halford Chessman,  Our last conversation in the office was re; [place] PAP MACHINE which when [place] got an estimate on repairing it ($1,500) was not practical as they could not guarantee it etc. I believe you said they have to provide what you order and I need you to order a Res Med S9/H5i which is like the first machine I had. If you don't specify this they will send a much inferior machine just like one I rented last fall. It is much smaller, does not have the ramp up feature, is made out of cheap thin plastic and really is junk.  I am eligible for a new machine I believe (5 years ?).  I am using the S9 I borrowed from a friend until I can get another one. My friend needs hers back asap & just called me tonight.  I use the borrowed machine whenever I sleep-naps and nights-& am dependent on it. I I am sleeping in a recliner so I am not flat in the bed.  Please ask your staff to keep me informed of progress. Thanks loads! ----------------------------------------  VS please advise if you're ok with ordering a new machine for pt.  Thanks!

## 2015-08-20 ENCOUNTER — Telehealth: Payer: Self-pay | Admitting: Pulmonary Disease

## 2015-08-20 ENCOUNTER — Encounter: Payer: Self-pay | Admitting: Pulmonary Disease

## 2015-08-20 DIAGNOSIS — I890 Lymphedema, not elsewhere classified: Secondary | ICD-10-CM | POA: Diagnosis not present

## 2015-08-20 DIAGNOSIS — R2681 Unsteadiness on feet: Secondary | ICD-10-CM | POA: Diagnosis not present

## 2015-08-20 DIAGNOSIS — M25551 Pain in right hip: Secondary | ICD-10-CM | POA: Diagnosis not present

## 2015-08-20 DIAGNOSIS — M6281 Muscle weakness (generalized): Secondary | ICD-10-CM | POA: Diagnosis not present

## 2015-08-20 DIAGNOSIS — R601 Generalized edema: Secondary | ICD-10-CM | POA: Diagnosis not present

## 2015-08-20 NOTE — Telephone Encounter (Signed)
Noted  

## 2015-08-23 ENCOUNTER — Encounter: Payer: Self-pay | Admitting: Occupational Therapy

## 2015-08-23 DIAGNOSIS — I890 Lymphedema, not elsewhere classified: Secondary | ICD-10-CM

## 2015-08-23 DIAGNOSIS — H43811 Vitreous degeneration, right eye: Secondary | ICD-10-CM | POA: Diagnosis not present

## 2015-08-23 NOTE — Therapy (Signed)
[place] MAIN [facility] SERVICES [address] [place], [place], [address] Phone: [phone]   Fax:  [phone]  August 23, 2015    _0 @  Occupational Therapy Discharge Summary   Patient: Tamara Morales MRN: [id number] Date of Birth: Feb 21, 1931  Diagnosis: Lymphedema  Referring Provider: Garret Reddish, MD  The above patient had been seen in Occupational Therapy . The treatment consisted of Intensive Phase CDT to BLE The patient is: unchanged   Functional Status at Discharge: unchanged  No Goals Met      Plan - 08/23/15 1052    Clinical Impression Statement Pt is discharged from OT for lymphedema care. She has not returned for treatment as per POC.   Rehab Potential Good   OT Frequency 3x / week   OT Duration Other (comment)  and PRN to complete CDT to one leg at a time   OT Treatment/Interventions Self-care/ADL training;Therapeutic exercise;Functional Mobility Training;Patient/family education;Manual Therapy;Manual lymph drainage;Other (comment);DME and/or AE instruction;Compression bandaging;Therapeutic activities  skin care w/ low ph lotion and castor oil      Sincerely,  Andrey Spearman, MS, OTR/L, CLT-LANA 08/23/2015 11:00 AM  CC _1 @  [place], [place], [address] Phone: [phone]   Fax:  [phone]  Patient: Tamara Morales MRN: [id number] Date of Birth: [birth year]

## 2015-08-25 ENCOUNTER — Other Ambulatory Visit: Payer: Self-pay

## 2015-08-25 NOTE — Patient Outreach (Signed)
[facility]) Care Management  08/25/2015  Tamara Morales Jul 11, 1930 [id number]   Outreach call #1 to patient for telephonic monthly assessment.  Patient not reached.  RN CM left HIPAA compliant voice message with name and number for call back.  RN CM scheduled for next outreach call.   Mariann Laster, RN, BSN, [facility], CCM  [facility] [employer] Management Coordinator [phone] Direct [phone] Cell [phone] Office [phone] Fax

## 2015-08-26 ENCOUNTER — Non-Acute Institutional Stay: Payer: Medicare Other | Admitting: Nurse Practitioner

## 2015-08-26 ENCOUNTER — Ambulatory Visit: Payer: Self-pay

## 2015-08-26 ENCOUNTER — Other Ambulatory Visit: Payer: Self-pay

## 2015-08-26 ENCOUNTER — Encounter: Payer: Self-pay | Admitting: Nurse Practitioner

## 2015-08-26 VITALS — BP 110/68 | HR 81 | Temp 97.5°F | Ht 65.0 in | Wt 295.4 lb

## 2015-08-26 DIAGNOSIS — F329 Major depressive disorder, single episode, unspecified: Secondary | ICD-10-CM

## 2015-08-26 DIAGNOSIS — N179 Acute kidney failure, unspecified: Secondary | ICD-10-CM | POA: Diagnosis not present

## 2015-08-26 DIAGNOSIS — F32A Depression, unspecified: Secondary | ICD-10-CM

## 2015-08-26 DIAGNOSIS — F418 Other specified anxiety disorders: Secondary | ICD-10-CM

## 2015-08-26 DIAGNOSIS — I10 Essential (primary) hypertension: Secondary | ICD-10-CM | POA: Diagnosis not present

## 2015-08-26 DIAGNOSIS — N184 Chronic kidney disease, stage 4 (severe): Secondary | ICD-10-CM | POA: Diagnosis not present

## 2015-08-26 DIAGNOSIS — E039 Hypothyroidism, unspecified: Secondary | ICD-10-CM | POA: Diagnosis not present

## 2015-08-26 DIAGNOSIS — I890 Lymphedema, not elsewhere classified: Secondary | ICD-10-CM

## 2015-08-26 DIAGNOSIS — K219 Gastro-esophageal reflux disease without esophagitis: Secondary | ICD-10-CM

## 2015-08-26 DIAGNOSIS — F419 Anxiety disorder, unspecified: Secondary | ICD-10-CM

## 2015-08-26 DIAGNOSIS — E876 Hypokalemia: Secondary | ICD-10-CM | POA: Diagnosis not present

## 2015-08-26 MED ORDER — POTASSIUM CHLORIDE ER 10 MEQ PO CPCR: ORAL_CAPSULE | ORAL | Status: DC

## 2015-08-26 NOTE — Progress Notes (Signed)
Patient ID: Tamara Morales, female   DOB: 11-Aug-1930, 82 y.o.   MRN: [id number]   Location:  [facility] (12) Provider: Marlana Latus NP  Code Status: DNR Goals of Care:  Advanced Directives 08/26/2015  Does patient have an advance directive? Yes  Type of Advance Directive Healthcare Power of Attorney  Copy of advanced directive(s) in chart? Yes  Would patient like information on creating an advanced directive? -     Chief Complaint  Patient presents with  . Depression    depression and anxiety    HPI: Patient is a 82 y.o. female seen today for medical management of chronic diseases.  Hx of kidney stone, taking Flomax 0.4mg  started. Denied R flank or abd pain today. Takes HCTZ and Furosemide for BLE edema, chronic BLE edema, taking Kcl 74meq daily, K 4.3, Bun 19, creat 0.85 07/13/15. HTN controlled on Amlodipine/Valsarta 5/160mg  daily. Depression/anxiety, mood is stable, sleeps well while on Celexa 10mg , Alprazolam prn bid. Hypothyroidism is well supplemented while on Synthroid 279mcg. Asymptomatic GERD while on Omeprazole 20mg  bid, may consider decrease to daily.     Past Medical History  Diagnosis Date  . Morbid obesity ([place])   . MIGRAINE HEADACHE   . ARTHRITIS, KNEES, BILATERAL   . DEPRESSION   . GERD   . HYPERLIPIDEMIA   . HYPERTENSION   . DJD (degenerative joint disease) of knee   . Urge incontinence   . HYPOTHYROIDISM     postsurgical  . OSA (obstructive sleep apnea) 01/17/2011 dx  . Hypoxia   . Oxygen dependent   . Breast CA ([place]) 2000    s/p R lumpectomy and XRT  . UTI (lower urinary tract infection) 12/2014    Past Surgical History  Procedure Laterality Date  . Cholecystectomy  1995  . Breast lumpectomy  2000  . Thyroidectomy    . Breast biopsy  2000  . Replacement total knee bilateral  1999, 2005  . Tubal ligation    . Parathyroidectomy      2-3 removed  . Tonsillectomy    . Adenoidectomy    . Right leg femur  repaired    . Abdominal hysterectomy    . Cataract extraction    . Cystoscopy/ureteroscopy/holmium laser Right 06/28/2015    Procedure: CYSTOSCOPY RIGHT RETROGRAD RIGHT URETEROSCOPY/HOLMIUM LASER WITH RIGHT STENT PLACEMENT;  Surgeon: Alexis Frock, MD;  Location: [facility];  Service: Urology;  Laterality: Right;    Allergies  Allergen Reactions  . Azithromycin Itching  . Beta Adrenergic Blockers     Depression   . Ciprofloxacin Other (See Comments)    REACTION: edgy and very jumpy   . Codeine Nausea And Vomiting  . Epinephrine Other (See Comments)    REACTION: rapid pulse, sweats  . Klonopin [Clonazepam] Other (See Comments)    Makes her feel very suicidal   . Oxycodone Other (See Comments)    Patient felt like it altered her mental status "Crazy" . Hallucinations later as well.  [name] Paxil [Paroxetine Hydrochloride] Other (See Comments)    Sever depression   . Prednisone     "makes me feel really bad"  . Propoxyphene Hcl Nausea And Vomiting  . Tramadol Other (See Comments)    Feels "jittery"  . Meloxicam Diarrhea  . Vancomycin Rash    Localized rash related to infusion rate.      Medication List       This list is accurate as of: 08/26/15  3:32  PM.  Always use your most recent med list.               ALPRAZolam 0.25 MG tablet  Commonly known as:  XANAX  Take one tablet by mouth twice daily as needed for anxiety     amLODipine-valsartan 5-160 MG tablet  Commonly known as:  EXFORGE  Take 1 tablet by mouth daily.     aspirin 81 MG tablet  Take 81 mg by mouth daily.     cetirizine 10 MG tablet  Commonly known as:  ZYRTEC  Take 10 mg by mouth daily.     citalopram 10 MG tablet  Commonly known as:  CELEXA  Take one tablet by mouth once daily for depression     clotrimazole 1 % cream  Commonly known as:  LOTRIMIN  Apply 1 application topically 2 (two) times daily. As needed for fungal skin infections     FIBER PO  Take 2 tablets by mouth every morning.     Fish  Oil 1200 MG Caps  Take 2,400 mg by mouth daily.     fluticasone 50 MCG/ACT nasal spray  Commonly known as:  FLONASE  Place 2 sprays into both nostrils daily.     hydrochlorothiazide 12.5 MG tablet  Commonly known as:  HYDRODIURIL  Take 1 tablet (12.5 mg total) by mouth daily.     isometheptene-acetaminophen-dichloralphenazone 65-100-325 MG capsule  Commonly known as:  MIDRIN  Take 1 capsule by mouth daily as needed for migraine.     nystatin 100000 UNIT/GM Powd  APPLY TO AFFECTED AREA TWICE A DAY     omeprazole 20 MG capsule  Commonly known as:  PRILOSEC  TAKE 1 CAPSULE (20 MG TOTAL) BY MOUTH 2 (TWO) TIMES DAILY.     potassium chloride 10 MEQ CR capsule  Commonly known as:  MICRO-K  Take 4 capsules (40 Meq) daily for Potassium     SYNTHROID 200 MCG tablet  Generic drug:  levothyroxine  Take 1 tablet (200 mcg total) by mouth daily before breakfast.     torsemide 20 MG tablet  Commonly known as:  DEMADEX  Take 1 tablet (20 mg total) by mouth daily.        Review of Systems:  Review of Systems  Constitutional:       Obese  HENT: Negative for congestion, ear pain, hearing loss, sore throat and tinnitus.   Eyes: Negative for photophobia, pain and redness.  Respiratory: Negative for cough and shortness of breath.   Cardiovascular: Positive for leg swelling. Negative for chest pain and palpitations.       Chronic BLE, dermatosclerosis changes  Gastrointestinal: Negative for nausea, vomiting, abdominal pain, diarrhea, constipation and blood in stool.  Genitourinary: Positive for frequency. Negative for dysuria, urgency, hematuria and flank pain.       The right   Musculoskeletal: Positive for back pain. Negative for myalgias.  Skin: Negative for rash.  Neurological: Negative for dizziness, weakness and headaches.  Psychiatric/Behavioral: The patient is nervous/anxious.     Health Maintenance  Topic Date Due  . DEXA SCAN  09/07/1997  . INFLUENZA VACCINE  01/18/2016    . TETANUS/TDAP  10/05/2019  . ZOSTAVAX  Completed  . PNA vac Low Risk Adult  Completed    Physical Exam: Filed Vitals:   08/26/15 1427  BP: 110/68  Pulse: 81  Temp: 97.5 F (36.4 C)  TempSrc: Oral  Height: $Remove'5\' 5"'ekwOIBp$  (1.651 m)  Weight: 295 lb 6.4 oz (133.993 kg)  Body mass index is 49.16 kg/(m^2). Physical Exam  Constitutional: She is oriented to person, place, and time. She appears well-developed and well-nourished. She is active and cooperative. No distress.  BP 144/81 mmHg  Pulse 87  Temp(Src) 98 F (36.7 C) (Oral)  Resp 17  SpO2 95%  HENT:  Head: Normocephalic and atraumatic.  Right Ear: Hearing and external ear normal.  Left Ear: Hearing and external ear normal.  Nose: Nose normal.  Mouth/Throat: Oropharynx is clear and moist.  Eyes: Conjunctivae are normal. No scleral icterus.  Neck: Normal range of motion. Neck supple. No thyromegaly present.  Cardiovascular: Normal rate, regular rhythm, normal heart sounds and intact distal pulses.   Pulses:      Radial pulses are 2+ on the right side, and 2+ on the left side.       Dorsalis pedis pulses are 1+ on the right side, and 1+ on the left side.  Pulmonary/Chest: Effort normal and breath sounds normal.  Abdominal: Soft. There is no tenderness.  Musculoskeletal: She exhibits edema.       Lumbar back: She exhibits decreased range of motion and pain. She exhibits no tenderness, no bony tenderness, no swelling and no edema.       Back:  Electric w/c for mobility Chronic BLE dermatosclerosis, trace edema   Lymphadenopathy:       Head (right side): No tonsillar, no preauricular, no posterior auricular and no occipital adenopathy present.       Head (left side): No tonsillar, no preauricular, no posterior auricular and no occipital adenopathy present.    She has no cervical adenopathy.       Right: No supraclavicular adenopathy present.       Left: No supraclavicular adenopathy present.  Both lower legs with substantial  swelling and skin changes consistent with the chronic swelling.  Neurological: She is alert and oriented to person, place, and time. No sensory deficit.  Skin: Skin is warm, dry and intact. No cyanosis or erythema. Nails show no clubbing.  Psychiatric: She has a normal mood and affect. Her speech is normal and behavior is normal.    Labs reviewed: Basic Metabolic Panel:  Recent Labs  12/28/14 1345  01/10/15 0500 02/08/15 1415  06/23/15 1918  06/29/15 0430 06/30/15 0435 07/06/15 07/13/15 07/26/15 0751  NA  --   < > 140  --   < >  --   < > 140 140 141 140 140  K  --   < > 3.7  --   < >  --   < > 3.9 4.2 2.9* 4.3 3.6  CL  --   < > 107  --   < >  --   < > 106 105  --   --  101  CO2  --   < > 27  --   < >  --   < > 27 29  --   --  27  GLUCOSE  --   < > 106*  --   < >  --   < > 163* 113*  --   --  127*  BUN  --   < > 8  --   < >  --   < > 22* 16 23* 19 15  CREATININE  --   < > 0.73  --   < >  --   < > 1.12* 1.00 1.2* 0.8 0.80  CALCIUM  --   < > 7.4*  --   < >  --   < >  7.4* 7.6*  --   --  8.3*  MG  --   --  1.8  --   --   --   --   --   --   --   --   --   TSH 7.51*  --   --  0.67  --  0.768  --   --   --   --   --   --   < > = values in this interval not displayed. Liver Function Tests:  Recent Labs  06/28/15 0446 06/29/15 0430 06/30/15 0435 07/06/15  AST 40 33 49* 12*  ALT 33 28 32 15  ALKPHOS 90 79 88 76  BILITOT 0.7 0.6 0.6  --   PROT 6.3* 5.7* 6.1*  --   ALBUMIN 3.1* 2.8* 2.8*  --     Recent Labs  06/27/15 1215  LIPASE 19    Recent Labs  06/30/15 1115  AMMONIA 25   CBC:  Recent Labs  06/29/15 0430 06/30/15 0435 07/06/15 07/26/15 0751  WBC 8.7 7.3 8.5 6.3  NEUTROABS 7.0 5.4  --  4.5  HGB 10.9* 11.4* 12.0 12.0  HCT 35.5* 37.1 35* 36.3  MCV 93.4 93.7  --  88.3  PLT 135* 137* 219 145*   Lipid Panel: No results for input(s): CHOL, HDL, LDLCALC, TRIG, CHOLHDL, LDLDIRECT in the last 8760 hours. Lab Results  Component Value Date   HGBA1C 6.2 06/23/2015      Procedures since last visit: No results found.  Assessment/Plan  Acid reflux Stable, continue Omeprazole   Acute renal failure superimposed on stage 4 chronic kidney disease ([place]) 07/13/15 Bun 19, creat 0.8 Update BMP  Anxiety and depression Mood is stable, continue Celexa 10mg  daily, Alprazolam 0.125mg  bid prn. Observe the patient.   Essential hypertension Controlled, continue Amlodipine Valsartan 5/160mg  daily. HCTZ 12.5mg  daily. Torsemide 20mg . 07/22/15 Na 140, K 4.3, Bun 19, creat 0.85  Hypokalemia K 4.3 07/13/15, continue Kcl 26meq daily. Update BMP  Hypothyroidism Continue Synthyroid 282mcg, last TSH 0.768 07/03/15. Update TSH  Lymphedema of both lower extremities Chronic 2+, Continue HCTZ, Torsemide.     Labs/tests ordered:  BMP and TSH  Next appt:  Visit date not found

## 2015-08-26 NOTE — Assessment & Plan Note (Signed)
K 4.3 07/13/15, continue Kcl 29meq daily. Update BMP

## 2015-08-26 NOTE — Assessment & Plan Note (Signed)
Controlled, continue Amlodipine Valsartan 5/160mg  daily. HCTZ 12.5mg  daily. Torsemide 20mg . 07/22/15 Na 140, K 4.3, Bun 19, creat 0.85

## 2015-08-26 NOTE — Assessment & Plan Note (Signed)
Chronic 2+, Continue HCTZ, Torsemide.

## 2015-08-26 NOTE — Assessment & Plan Note (Signed)
Continue Synthyroid 285mcg, last TSH 0.768 07/03/15. Update TSH

## 2015-08-26 NOTE — Assessment & Plan Note (Signed)
07/13/15 Bun 19, creat 0.8 Update BMP

## 2015-08-26 NOTE — Assessment & Plan Note (Signed)
Mood is stable, continue Celexa 10mg  daily, Alprazolam 0.125mg  bid prn. Observe the patient.

## 2015-08-26 NOTE — Assessment & Plan Note (Signed)
Stable, continue Omeprazole.  

## 2015-08-27 ENCOUNTER — Encounter: Payer: Self-pay | Admitting: Pulmonary Disease

## 2015-08-27 ENCOUNTER — Ambulatory Visit (INDEPENDENT_AMBULATORY_CARE_PROVIDER_SITE_OTHER): Payer: Medicare Other | Admitting: Pulmonary Disease

## 2015-08-27 ENCOUNTER — Ambulatory Visit: Payer: Self-pay

## 2015-08-27 VITALS — BP 122/60 | HR 83 | Ht 65.0 in | Wt 296.8 lb

## 2015-08-27 DIAGNOSIS — G4733 Obstructive sleep apnea (adult) (pediatric): Secondary | ICD-10-CM | POA: Diagnosis not present

## 2015-08-27 DIAGNOSIS — Z6841 Body Mass Index (BMI) 40.0 and over, adult: Secondary | ICD-10-CM

## 2015-08-27 DIAGNOSIS — E662 Morbid (severe) obesity with alveolar hypoventilation: Secondary | ICD-10-CM | POA: Diagnosis not present

## 2015-08-27 NOTE — Patient Instructions (Signed)
Will call with results of overnight oxygen test  Follow up in 1 year

## 2015-08-27 NOTE — Progress Notes (Signed)
Current Outpatient Prescriptions on File Prior to Visit  Medication Sig  . ALPRAZolam (XANAX) 0.25 MG tablet Take one tablet by mouth twice daily as needed for anxiety  . amLODipine-valsartan (EXFORGE) 5-160 MG tablet Take 1 tablet by mouth daily.  [name] aspirin 81 MG tablet Take 81 mg by mouth daily.   . cetirizine (ZYRTEC) 10 MG tablet Take 10 mg by mouth daily.   . citalopram (CELEXA) 10 MG tablet Take one tablet by mouth once daily for depression  . clotrimazole (LOTRIMIN) 1 % cream Apply 1 application topically 2 (two) times daily. As needed for fungal skin infections  . FIBER PO Take 2 tablets by mouth every morning.   . fluticasone (FLONASE) 50 MCG/ACT nasal spray Place 2 sprays into both nostrils daily.  . hydrochlorothiazide (HYDRODIURIL) 12.5 MG tablet Take 1 tablet (12.5 mg total) by mouth daily.  [name] isometheptene-acetaminophen-dichloralphenazone (MIDRIN) 65-100-325 MG capsule Take 1 capsule by mouth daily as needed for migraine.  . nystatin (MYCOSTATIN/NYSTOP) 100000 UNIT/GM POWD APPLY TO AFFECTED AREA TWICE A DAY  . Omega-3 Fatty Acids (FISH OIL) 1200 MG CAPS Take 2,400 mg by mouth daily.   [name] omeprazole (PRILOSEC) 20 MG capsule TAKE 1 CAPSULE (20 MG TOTAL) BY MOUTH 2 (TWO) TIMES DAILY.  [name] potassium chloride (MICRO-K) 10 MEQ CR capsule Take 4 capsules (40 Meq) daily for Potassium  . SYNTHROID 200 MCG tablet Take 1 tablet (200 mcg total) by mouth daily before breakfast.  . torsemide (DEMADEX) 20 MG tablet Take 1 tablet (20 mg total) by mouth daily.   No current facility-administered medications on file prior to visit.    Chief Complaint  Patient presents with  . Follow-up    Wears cpap nightly. Denies problems with mask/pressure.    Tests PSG 01/22/11 >> AHI 74.3, SpO2 low 76%. ONO with RA and CPAP 04/21/11 >> Test time 5 hr 12 min. Basal SpO2 87%, low SpO2 73%. Spent 161 min with SpO2 < 88%. Echo 04/01/13 >> EF 55 to 60%, PAS 43 mmHg CT chest 11/03/14 >> atherosclerosis, patchy  atelectasis, scoliosis, old Rt fib fx Auto CPAP 05/22/15 to 08/19/15 >> used on 85 of 90 nights with average 6 hrs and 48 min.  Average AHI is 0.8 with median CPAP 5 cm H2O and 95 th percentile CPAP 6 cm H20.   Past medical history HA, OA, Depression GERD, HLD, HTN, Breast cancer in 2000, Hypothyroidism  PSHx, Medications, Allergies, Fhx, Shx reviewed.  Vital signs. BP 122/60 mmHg  Pulse 83  Ht 5\' 5"  (1.651 m)  Wt 296 lb 12.8 oz (134.628 kg)  BMI 49.39 kg/m2  SpO2 95%   History of Present Illness: Tamara Morales is a 82 y.o. female with severe OSA using CPAP and 2 liters oxygen.  She purchased a refurbished CPAP.  She is using air fit mask.  With this combination she actually likes using CPAP.  She is using 2 liters oxygen at night.  She gets mild irritation over her nose from mask.  She has episodes when she wakes up and feels panicked.  She goes to the hospital and is told everything is fine.  She is not sure what is triggering this.  Physical Exam:  General - No distress, sitting in wheel chair ENT - No sinus tenderness, no oral exudate, no LAN Cardiac - s1s2 regular, no murmur Chest - No wheeze/rales/dullness Back - No focal tenderness Abd - Soft, non-tender Ext - 2+ edema Neuro - Normal strength Skin - No rashes  Psych - normal mood, and behavior    Assessment/Plan:  Obstructive sleep apnea. Plan: - continue auto CPAP  Obesity hypoventilation syndrome. Plan: - continue 2 liters oxygen at night with CPAP  Morbid obesity. Plan: - discussed options for assisting with weight loss  Nocturnal panic attacks. Plan: - will check ONO on CPAP and room air to determine if nocturnal hypoxia could be contributing to this   Tamara Mires, MD [facility] Pager:  [phone] 08/27/2015, 2:38 PM

## 2015-08-30 ENCOUNTER — Ambulatory Visit: Payer: Self-pay

## 2015-08-31 ENCOUNTER — Other Ambulatory Visit: Payer: Self-pay

## 2015-08-31 DIAGNOSIS — I10 Essential (primary) hypertension: Secondary | ICD-10-CM | POA: Diagnosis not present

## 2015-08-31 DIAGNOSIS — E039 Hypothyroidism, unspecified: Secondary | ICD-10-CM | POA: Diagnosis not present

## 2015-08-31 LAB — BASIC METABOLIC PANEL
BUN: 17 mg/dL (ref 4–21)
Creatinine: 0.6 mg/dL (ref 0.5–1.1)
Glucose: 105 mg/dL
POTASSIUM: 3.9 mmol/L (ref 3.4–5.3)
SODIUM: 141 mmol/L (ref 137–147)

## 2015-08-31 LAB — TSH: TSH: 0.03 u[IU]/mL — AB (ref 0.41–5.90)

## 2015-08-31 NOTE — Patient Outreach (Addendum)
[facility]) Care Management  08/31/2015  Tamara Morales 1930-10-07 [id number]  Outreach call #2 to patient for telephonic monthly assessment.  Patient not reached at home or cell #  RN CM left HIPAA compliant voice message with name and number for call back.  RN CM scheduled for next outreach call.   Mariann Laster, RN, BSN, [facility], CCM  [employer] Management Coordinator [phone] Direct [phone] Cell [phone] Office [phone] Fax

## 2015-09-02 ENCOUNTER — Ambulatory Visit: Payer: Self-pay

## 2015-09-02 NOTE — Progress Notes (Signed)
Cardiology Office Note   Date:  09/03/2015   ID:  Tamara Morales, DOB 01/29/1931, MRN [id number]  PCP:  Garret Reddish, MD  Cardiologist:   Minus Breeding, MD   No chief complaint on file.     History of Present Illness: Tamara Morales is a 82 y.o. female who presents for evaluation of edema. She has a history of cardiac catheterizations in the distant past. She reports that these are normal.  Stress perfusion study from 2014 was negative for any evidence of ischemia. She had a well preserved ejection fraction in the past. Her last EF 2014.   I last saw her in November for lower extremity swelling. This is felt to be multifactorial. She does think her edema is better than it was previously. However, she does not like to take the torsemide which has been prescribed because it makes her urinate so much. She actually hasn't taken for a couple of days. She's not having any new shortness of breath, PND or orthopnea. She's not having any new palpitations, presyncope or syncope. Her weight is actually down a little bit since I last saw her.  Past Medical History  Diagnosis Date  . Morbid obesity ([place])   . MIGRAINE HEADACHE   . ARTHRITIS, KNEES, BILATERAL   . DEPRESSION   . GERD   . HYPERLIPIDEMIA   . HYPERTENSION   . DJD (degenerative joint disease) of knee   . Urge incontinence   . HYPOTHYROIDISM     postsurgical  . OSA (obstructive sleep apnea) 01/17/2011 dx  . Hypoxia   . Oxygen dependent   . Breast CA ([place]) 2000    s/p R lumpectomy and XRT  . UTI (lower urinary tract infection) 12/2014    Past Surgical History  Procedure Laterality Date  . Cholecystectomy  1995  . Breast lumpectomy  2000  . Thyroidectomy    . Breast biopsy  2000  . Replacement total knee bilateral  1999, 2005  . Tubal ligation    . Parathyroidectomy      2-3 removed  . Tonsillectomy    . Adenoidectomy    . Right leg femur repaired    . Abdominal hysterectomy    . Cataract extraction    .  Cystoscopy/ureteroscopy/holmium laser Right 06/28/2015    Procedure: CYSTOSCOPY RIGHT RETROGRAD RIGHT URETEROSCOPY/HOLMIUM LASER WITH RIGHT STENT PLACEMENT;  Surgeon: Alexis Frock, MD;  Location: [facility];  Service: Urology;  Laterality: Right;     Current Outpatient Prescriptions  Medication Sig Dispense Refill  . ALPRAZolam (XANAX) 0.25 MG tablet Take one tablet by mouth twice daily as needed for anxiety 60 tablet 4  . amLODipine-valsartan (EXFORGE) 5-160 MG tablet Take 1 tablet by mouth daily. 90 tablet 1  . aspirin 81 MG tablet Take 81 mg by mouth daily.     . cetirizine (ZYRTEC) 10 MG tablet Take 10 mg by mouth daily.     . citalopram (CELEXA) 10 MG tablet Take one tablet by mouth once daily for depression 30 tablet 0  . clotrimazole (LOTRIMIN) 1 % cream Apply 1 application topically 2 (two) times daily. As needed for fungal skin infections 113 g 1  . FIBER PO Take 2 tablets by mouth every morning.     . fluticasone (FLONASE) 50 MCG/ACT nasal spray Place 2 sprays into both nostrils daily. 16 g 2  . hydrochlorothiazide (HYDRODIURIL) 12.5 MG tablet Take 1 tablet (12.5 mg total) by mouth daily. 90 tablet 1  . isometheptene-acetaminophen-dichloralphenazone (MIDRIN)  65-100-325 MG capsule Take 1 capsule by mouth daily as needed for migraine. 30 capsule 0  . nystatin (MYCOSTATIN/NYSTOP) 100000 UNIT/GM POWD APPLY TO AFFECTED AREA TWICE A DAY 120 g 2  . Omega-3 Fatty Acids (FISH OIL) 1200 MG CAPS Take 2,400 mg by mouth daily.     [name] omeprazole (PRILOSEC) 20 MG capsule Take 20 mg by mouth daily.    . potassium chloride (MICRO-K) 10 MEQ CR capsule Take 4 capsules (40 Meq) daily for Potassium 30 capsule 3  . SYNTHROID 200 MCG tablet Take 1 tablet (200 mcg total) by mouth daily before breakfast. 90 tablet 3  . torsemide (DEMADEX) 20 MG tablet Take 1 tablet (20 mg total) by mouth daily. 30 tablet 6   No current facility-administered medications for this visit.    Allergies:   Azithromycin; Beta  adrenergic blockers; Ciprofloxacin; Codeine; Epinephrine; Klonopin; Oxycodone; Paxil; Prednisone; Propoxyphene hcl; Tramadol; Meloxicam; and Vancomycin    ROS:  Please see the history of present illness.   Otherwise, review of systems are positive for none.   All other systems are reviewed and negative.    PHYSICAL EXAM: VS:  BP 160/80 mmHg  Pulse 94  Ht $R'5\' 5"'ya$  (1.651 m) , BMI There is no weight on file to calculate BMI. PHYSICAL EXAM GEN:  No distress NECK:  No jugular venous distention at 90 degrees, waveform within normal limits, carotid upstroke brisk and symmetric, no bruits, no thyromegaly LUNGS:  Clear to auscultation bilaterally BACK:  No CVA tenderness CHEST:  Unremarkable HEART:  S1 and S2 within normal limits, no S3, no S4, no clicks, no rubs, no murmurs ABD:  Positive bowel sounds normal in frequency in pitch, no bruits, no rebound, no guarding, unable to assess midline mass or bruit with the patient seated. EXT:  2 plus pulses throughout, moderate edema and lymphedema.  , no cyanosis no clubbing     EKG:  EKG is not ordered today.   Recent Labs: 01/07/2015: B Natriuretic Peptide 149.7* 01/10/2015: Magnesium 1.8 07/06/2015: ALT 15 07/26/2015: Hemoglobin 12.0; Platelets 145* 08/31/2015: BUN 17; Creatinine 0.6; Potassium 3.9; Sodium 141; TSH 0.03*    Lipid Panel    Component Value Date/Time   CHOL 172 07/29/2014 1000   TRIG 159.0* 07/29/2014 1000   TRIG 65 07/27/2009   HDL 42.30 07/29/2014 1000   CHOLHDL 4 07/29/2014 1000   VLDL 31.8 07/29/2014 1000   LDLCALC 98 07/29/2014 1000   LDLDIRECT 99.4 03/13/2012 1133      Wt Readings from Last 3 Encounters:  08/27/15 296 lb 12.8 oz (134.628 kg)  08/26/15 295 lb 6.4 oz (133.993 kg)  07/29/15 294 lb (133.358 kg)      Other studies Reviewed: Additional studies/ records that were reviewed today include: None   ASSESSMENT AND PLAN:  EDEMA:  We had another long discussion about this. He's been holding off on taking  her diuretics as makes her urinate so much and I discussed with her the problem with this. She will get back on her diuretics and comply with salt restriction and keeping her feet elevated. No change in therapy is planned at this point.  DYSPNEA:  I do think she does at times have flash pulmonary edema with diastolic dysfunction. We talked about mechanism of this as well. She will again compliant with the above.  HTN:  Her blood pressure is elevated but this is unusual. She will be getting this checked and let me know if it's running higher.   Current medicines  are reviewed at length with the patient today.  The patient does not have concerns regarding medicines.  The following changes have been made:  no change  Labs/ tests ordered today include:   No orders of the defined types were placed in this encounter.     Disposition:   FU with 6 months.     Signed, Minus Breeding, MD  09/03/2015 4:35 PM    [facility]

## 2015-09-03 ENCOUNTER — Other Ambulatory Visit: Payer: Self-pay

## 2015-09-03 ENCOUNTER — Encounter: Payer: Self-pay | Admitting: Cardiology

## 2015-09-03 ENCOUNTER — Ambulatory Visit (INDEPENDENT_AMBULATORY_CARE_PROVIDER_SITE_OTHER): Payer: Medicare Other | Admitting: Cardiology

## 2015-09-03 VITALS — BP 160/80 | HR 94 | Ht 65.0 in

## 2015-09-03 DIAGNOSIS — M7989 Other specified soft tissue disorders: Secondary | ICD-10-CM

## 2015-09-03 NOTE — Patient Outreach (Signed)
[facility]) Care Management  09/03/2015  Tamara Morales 11-05-1930 [id number]  Outreach call #3 to patient for telephonic monthly assessment.  Patient not reached at home # or cell #  RN CM left HIPAA compliant voice message with name and number for call back.  RN CM mailed unsuccessful outreach letter to patient 09/03/2015.  RN CM will review case for patient response in 2 weeks.   Mariann Laster, RN, BSN, [facility], CCM  [employer] Management Coordinator [phone] Direct [phone] Cell [phone] Office [phone] Fax

## 2015-09-03 NOTE — Patient Instructions (Signed)
Your physician wants you to follow-up in: 6 Months You will receive a reminder letter in the mail two months in advance. If you don't receive a letter, please call our office to schedule the follow-up appointment.  

## 2015-09-07 ENCOUNTER — Telehealth: Payer: Self-pay

## 2015-09-07 NOTE — Telephone Encounter (Signed)
left message for patient to call office back regarding labs she also has appt on 11/18/2015 at 2:30 in the Clinic, and she has a lab appt on 11/16/15 at 7:00 am in [place]

## 2015-09-07 NOTE — Telephone Encounter (Signed)
Per [facility] wants to change Synthroid to 175 MCG and wants patient to continue to take potassium as directed, also wants TSH in 8 weeks

## 2015-09-08 NOTE — Telephone Encounter (Signed)
Patient called the office, we do not know what lab results patient was calling in reference to. Please call patient on her home number.

## 2015-09-09 ENCOUNTER — Other Ambulatory Visit: Payer: Self-pay | Admitting: *Deleted

## 2015-09-09 MED ORDER — LEVOTHYROXINE SODIUM 175 MCG PO TABS: 175.0000 ug | ORAL_TABLET | Freq: Every day | ORAL | Status: DC

## 2015-09-20 ENCOUNTER — Other Ambulatory Visit: Payer: Self-pay

## 2015-09-20 NOTE — Patient Outreach (Signed)
[facility]) Care Management  09/20/2015  JUNEAU DOUGHMAN Sep 05, 1930 [id number]   Patient unable to reach following several phone attempts and unsuccessful outreach letter.  Case closed.  MD notified via case closure letter.  [place] Management Assistant notified to close case:  Unable to maintain contact with patient.   Mariann Laster, RN, BSN, [facility], CCM  [employer] Management Coordinator [phone] Direct [phone] Cell [phone] Office [phone] Fax

## 2015-09-27 ENCOUNTER — Telehealth: Payer: Self-pay | Admitting: Pulmonary Disease

## 2015-09-27 NOTE — Telephone Encounter (Signed)
ONO with CPAP and RA 09/09/15 >> test time 7 hrs 58 min.  Basal SpO2 91%.  Low SpO2 80%.  Spent 40.7 min with SpO2 < 885.   Will have my nurse inform pt that ONO shows oxygen level is low if she is only using CPAP.  She needs to continue using CPAP with 2 liters oxygen at night.

## 2015-09-28 DIAGNOSIS — M722 Plantar fascial fibromatosis: Secondary | ICD-10-CM | POA: Diagnosis not present

## 2015-09-29 NOTE — Telephone Encounter (Signed)
LM for patient x 1 

## 2015-09-30 NOTE — Telephone Encounter (Signed)
PT RETURNING CALL PT SAID THAT SHE WILL BE OUT ALL MORNING AND WILL NOT BE BACK IN UNTIL THIS AFTERNOON.Tamara Morales

## 2015-09-30 NOTE — Telephone Encounter (Signed)
Called and spoke with pt. Reviewed VS's recs. She voiced understanding and no further questions. Nothing further needed.

## 2015-10-07 ENCOUNTER — Telehealth: Payer: Self-pay

## 2015-10-07 ENCOUNTER — Encounter: Payer: Self-pay | Admitting: Nurse Practitioner

## 2015-10-07 NOTE — Telephone Encounter (Signed)
Received a fax from [facility] stating that the "patient would like Rx for Midrin (been on it, but we've never filled it here for her)- please send new Rx."  I could not find where the patient had previously taken this medication in our records. Do you want a prescription sent in to the pharmacy for this medication?    Please Advise.

## 2015-10-07 NOTE — Telephone Encounter (Signed)
Patient also sent a Mychart message requesting RX for Midrin to be sent to [facility]. I spoke with [place] ([place] assistant) to inform her that phone note needs review and advisement.

## 2015-10-14 ENCOUNTER — Encounter: Payer: Medicare Other | Admitting: Internal Medicine

## 2015-10-25 MED ORDER — ISOMETHEPTENE-DICHLORAL-APAP 65-100-325 MG PO CAPS: 1.0000 | ORAL_CAPSULE | ORAL | Status: DC | PRN

## 2015-10-27 ENCOUNTER — Encounter: Payer: Self-pay | Admitting: Nurse Practitioner

## 2015-10-28 NOTE — Telephone Encounter (Signed)
Called spoke with pharmacy stated that they would cut Xanax in half for patient in the future.

## 2015-10-28 NOTE — Telephone Encounter (Signed)
Please see note from patient and advise

## 2015-10-31 ENCOUNTER — Encounter: Payer: Self-pay | Admitting: Nurse Practitioner

## 2015-11-02 ENCOUNTER — Encounter: Payer: Self-pay | Admitting: Nurse Practitioner

## 2015-11-04 ENCOUNTER — Encounter: Payer: Self-pay | Admitting: Pulmonary Disease

## 2015-11-18 ENCOUNTER — Encounter: Payer: Self-pay | Admitting: Nurse Practitioner

## 2015-11-18 ENCOUNTER — Non-Acute Institutional Stay: Payer: Medicare Other | Admitting: Nurse Practitioner

## 2015-11-18 VITALS — BP 132/82 | HR 91 | Temp 97.3°F | Resp 22 | Ht 65.0 in | Wt 290.8 lb

## 2015-11-18 DIAGNOSIS — E039 Hypothyroidism, unspecified: Secondary | ICD-10-CM | POA: Diagnosis not present

## 2015-11-18 DIAGNOSIS — K219 Gastro-esophageal reflux disease without esophagitis: Secondary | ICD-10-CM

## 2015-11-18 DIAGNOSIS — F32A Depression, unspecified: Secondary | ICD-10-CM

## 2015-11-18 DIAGNOSIS — K589 Irritable bowel syndrome without diarrhea: Secondary | ICD-10-CM

## 2015-11-18 DIAGNOSIS — F418 Other specified anxiety disorders: Secondary | ICD-10-CM | POA: Diagnosis not present

## 2015-11-18 DIAGNOSIS — I10 Essential (primary) hypertension: Secondary | ICD-10-CM

## 2015-11-18 DIAGNOSIS — F419 Anxiety disorder, unspecified: Secondary | ICD-10-CM

## 2015-11-18 DIAGNOSIS — F329 Major depressive disorder, single episode, unspecified: Secondary | ICD-10-CM

## 2015-11-18 MED ORDER — HYDROCHLOROTHIAZIDE 12.5 MG PO TABS: 12.5000 mg | ORAL_TABLET | Freq: Every day | ORAL | Status: DC

## 2015-11-18 MED ORDER — SYNTHROID 200 MCG PO TABS: 200.0000 ug | ORAL_TABLET | Freq: Every day | ORAL | Status: DC

## 2015-11-18 MED ORDER — TORSEMIDE 20 MG PO TABS: 20.0000 mg | ORAL_TABLET | Freq: Every day | ORAL | Status: DC

## 2015-11-18 MED ORDER — POTASSIUM CHLORIDE ER 10 MEQ PO CPCR: ORAL_CAPSULE | ORAL | Status: DC

## 2015-11-18 MED ORDER — ALPRAZOLAM 0.25 MG PO TABS: ORAL_TABLET | ORAL | Status: DC

## 2015-11-18 MED ORDER — OMEPRAZOLE 20 MG PO CPDR: 20.0000 mg | DELAYED_RELEASE_CAPSULE | Freq: Every day | ORAL | Status: DC

## 2015-11-18 MED ORDER — CITALOPRAM HYDROBROMIDE 10 MG PO TABS: ORAL_TABLET | ORAL | Status: DC

## 2015-11-18 MED ORDER — DIPHENOXYLATE-ATROPINE 2.5-0.025 MG PO TABS: 1.0000 | ORAL_TABLET | Freq: Four times a day (QID) | ORAL | Status: DC | PRN

## 2015-11-18 NOTE — Assessment & Plan Note (Signed)
Continue Synthyroid 16mncg

## 2015-11-18 NOTE — Assessment & Plan Note (Signed)
Controlled, continue Amlodipine Valsartan 5/160mg  daily. HCTZ 12.5mg  daily. Torsemide 20mg . 07/22/15 Na 140, K 4.3, Bun 19, creat 0.85

## 2015-11-18 NOTE — Progress Notes (Signed)
Patient ID: Tamara Morales, female   DOB: 12-Jun-1931, 82 y.o.   MRN: [id number]   Location:   clinic [place]   Place of Service:   clinic [place]  Provider: Marlana Latus NP  Code Status: DNR Goals of Care:  Advanced Directives 11/18/2015  Does patient have an advance directive? Yes  Type of Advance Directive [place]  Does patient want to make changes to advanced directive? No - Patient declined  Copy of advanced directive(s) in chart? Yes     Chief Complaint  Patient presents with  . Medical Management of Chronic Issues    8 week f/u    HPI: Patient is a 82 y.o. female seen today for medical management of chronic diseases.  Hx of kidney stone, off Flomax. Takes HCTZ and Torsemide  for chronic BLE edema, taking Kcl 1meq daily, K 4.3, Bun 19, creat 0.85 07/13/15. HTN controlled on Amlodipine/Valsarta 5/160mg  daily. Depression/anxiety, mood is stable, sleeps well while on Alprazolam prn bid. Hypothyroidism is well supplemented while on Synthroid 175 mcg. Asymptomatic GERD while on Omeprazole 20mg  bid   Past Medical History  Diagnosis Date  . Morbid obesity ([place])   . MIGRAINE HEADACHE   . ARTHRITIS, KNEES, BILATERAL   . DEPRESSION   . GERD   . HYPERLIPIDEMIA   . HYPERTENSION   . DJD (degenerative joint disease) of knee   . Urge incontinence   . HYPOTHYROIDISM     postsurgical  . OSA (obstructive sleep apnea) 01/17/2011 dx  . Hypoxia   . Oxygen dependent   . Breast CA ([place]) 2000    s/p R lumpectomy and XRT  . UTI (lower urinary tract infection) 12/2014    Past Surgical History  Procedure Laterality Date  . Cholecystectomy  1995  . Breast lumpectomy  2000  . Thyroidectomy    . Breast biopsy  2000  . Replacement total knee bilateral  1999, 2005  . Tubal ligation    . Parathyroidectomy      2-3 removed  . Tonsillectomy    . Adenoidectomy    . Right leg femur repaired    . Abdominal hysterectomy    . Cataract extraction    .  Cystoscopy/ureteroscopy/holmium laser Right 06/28/2015    Procedure: CYSTOSCOPY RIGHT RETROGRAD RIGHT URETEROSCOPY/HOLMIUM LASER WITH RIGHT STENT PLACEMENT;  Surgeon: Alexis Frock, MD;  Location: [facility];  Service: Urology;  Laterality: Right;    Allergies  Allergen Reactions  . Azithromycin Itching  . Beta Adrenergic Blockers     Depression   . Ciprofloxacin Other (See Comments)    REACTION: edgy and very jumpy   . Codeine Nausea And Vomiting  . Epinephrine Other (See Comments)    REACTION: rapid pulse, sweats  . Klonopin [Clonazepam] Other (See Comments)    Makes her feel very suicidal   . Oxycodone Other (See Comments)    Patient felt like it altered her mental status "Crazy" . Hallucinations later as well.  [name] Paxil [Paroxetine Hydrochloride] Other (See Comments)    Sever depression   . Prednisone     "makes me feel really bad"  . Propoxyphene Hcl Nausea And Vomiting  . Tramadol Other (See Comments)    Feels "jittery"  . Meloxicam Diarrhea  . Vancomycin Rash    Localized rash related to infusion rate.      Medication List       This list is accurate as of: 11/18/15  3:34 PM.  Always use your most recent med list.  ALPRAZolam 0.25 MG tablet  Commonly known as:  XANAX  Take one tablet by mouth twice daily as needed for anxiety     amLODipine-valsartan 5-160 MG tablet  Commonly known as:  EXFORGE  Take 1 tablet by mouth daily.     aspirin 81 MG tablet  Take 81 mg by mouth daily.     cetirizine 10 MG tablet  Commonly known as:  ZYRTEC  Take 10 mg by mouth daily.     citalopram 10 MG tablet  Commonly known as:  CELEXA  Take one tablet by mouth once daily for depression     clotrimazole 1 % cream  Commonly known as:  LOTRIMIN  Apply 1 application topically 2 (two) times daily. As needed for fungal skin infections     diphenoxylate-atropine 2.5-0.025 MG tablet  Commonly known as:  LOMOTIL  Take 1 tablet by mouth 4 (four) times daily as needed  for diarrhea or loose stools.     FIBER PO  Take 2 tablets by mouth every morning.     Fish Oil 1200 MG Caps  Take 2,400 mg by mouth daily.     fluticasone 50 MCG/ACT nasal spray  Commonly known as:  FLONASE  Place 2 sprays into both nostrils daily.     hydrochlorothiazide 12.5 MG tablet  Commonly known as:  HYDRODIURIL  Take 1 tablet (12.5 mg total) by mouth daily.     isometheptene-acetaminophen-dichloralphenazone 65-100-325 MG capsule  Commonly known as:  MIDRIN  Take 1 capsule by mouth every 4 (four) hours as needed for migraine.     nystatin powder  Generic drug:  nystatin  APPLY TO AFFECTED AREA TWICE A DAY     omeprazole 20 MG capsule  Commonly known as:  PRILOSEC  Take 20 mg by mouth daily.     potassium chloride 10 MEQ CR capsule  Commonly known as:  MICRO-K  Take 4 capsules (40 Meq) daily for Potassium     SYNTHROID 200 MCG tablet  Generic drug:  levothyroxine  Take 1 tablet (200 mcg total) by mouth daily before breakfast.     levothyroxine 175 MCG tablet  Commonly known as:  SYNTHROID  Take 1 tablet (175 mcg total) by mouth daily before breakfast.     torsemide 20 MG tablet  Commonly known as:  DEMADEX  Take 1 tablet (20 mg total) by mouth daily.        Review of Systems:  Review of Systems  Constitutional:       Obese  HENT: Negative for congestion, ear pain, hearing loss, sore throat and tinnitus.   Eyes: Negative for photophobia, pain and redness.  Respiratory: Negative for cough and shortness of breath.   Cardiovascular: Positive for leg swelling. Negative for chest pain and palpitations.       Chronic BLE, dermatosclerosis changes  Gastrointestinal: Negative for nausea, vomiting, abdominal pain, diarrhea, constipation and blood in stool.  Genitourinary: Positive for frequency. Negative for dysuria, urgency, hematuria and flank pain.       The right   Musculoskeletal: Positive for back pain. Negative for myalgias.  Skin: Negative for rash.    Neurological: Negative for dizziness, weakness and headaches.  Psychiatric/Behavioral: The patient is nervous/anxious.     Health Maintenance  Topic Date Due  . DEXA SCAN  09/07/1997  . INFLUENZA VACCINE  01/18/2016  . TETANUS/TDAP  10/05/2019  . ZOSTAVAX  Completed  . PNA vac Low Risk Adult  Completed    Physical Exam: Filed Vitals:  11/18/15 1449  BP: 132/82  Pulse: 91  Temp: 97.3 F (36.3 C)  TempSrc: Oral  Resp: 22  Height: $Remove'5\' 5"'AfEjVrg$  (1.651 m)  Weight: 290 lb 12.8 oz (131.906 kg)  SpO2: 94%   Body mass index is 48.39 kg/(m^2). Physical Exam  Constitutional: She is oriented to person, place, and time. She appears well-developed and well-nourished. She is active and cooperative. No distress.  BP 144/81 mmHg  Pulse 87  Temp(Src) 98 F (36.7 C) (Oral)  Resp 17  SpO2 95%  HENT:  Head: Normocephalic and atraumatic.  Right Ear: Hearing and external ear normal.  Left Ear: Hearing and external ear normal.  Nose: Nose normal.  Mouth/Throat: Oropharynx is clear and moist.  Eyes: Conjunctivae are normal. No scleral icterus.  Neck: Normal range of motion. Neck supple. No thyromegaly present.  Cardiovascular: Normal rate, regular rhythm, normal heart sounds and intact distal pulses.   Pulses:      Radial pulses are 2+ on the right side, and 2+ on the left side.       Dorsalis pedis pulses are 1+ on the right side, and 1+ on the left side.  Pulmonary/Chest: Effort normal and breath sounds normal.  Abdominal: Soft. There is no tenderness.  Musculoskeletal: She exhibits edema.       Lumbar back: She exhibits decreased range of motion and pain. She exhibits no tenderness, no bony tenderness, no swelling and no edema.       Back:  Electric w/c for mobility Chronic BLE dermatosclerosis, trace edema   Lymphadenopathy:       Head (right side): No tonsillar, no preauricular, no posterior auricular and no occipital adenopathy present.       Head (left side): No tonsillar, no  preauricular, no posterior auricular and no occipital adenopathy present.    She has no cervical adenopathy.       Right: No supraclavicular adenopathy present.       Left: No supraclavicular adenopathy present.  Both lower legs with substantial swelling and skin changes consistent with the chronic swelling.  Neurological: She is alert and oriented to person, place, and time. No sensory deficit.  Skin: Skin is warm, dry and intact. No cyanosis or erythema. Nails show no clubbing.  Psychiatric: She has a normal mood and affect. Her speech is normal and behavior is normal.    Labs reviewed: Basic Metabolic Panel:  Recent Labs  01/10/15 0500 02/08/15 1415  06/23/15 1918  06/29/15 0430 06/30/15 0435  07/13/15 07/26/15 0751 08/31/15  NA 140  --   < >  --   < > 140 140  < > 140 140 141  K 3.7  --   < >  --   < > 3.9 4.2  < > 4.3 3.6 3.9  CL 107  --   < >  --   < > 106 105  --   --  101  --   CO2 27  --   < >  --   < > 27 29  --   --  27  --   GLUCOSE 106*  --   < >  --   < > 163* 113*  --   --  127*  --   BUN 8  --   < >  --   < > 22* 16  < > $R'19 15 17  'Ie$ CREATININE 0.73  --   < >  --   < > 1.12* 1.00  < >  0.8 0.80 0.6  CALCIUM 7.4*  --   < >  --   < > 7.4* 7.6*  --   --  8.3*  --   MG 1.8  --   --   --   --   --   --   --   --   --   --   TSH  --  0.67  --  0.768  --   --   --   --   --   --  0.03*  < > = values in this interval not displayed. Liver Function Tests:  Recent Labs  06/28/15 0446 06/29/15 0430 06/30/15 0435 07/06/15  AST 40 33 49* 12*  ALT 33 28 32 15  ALKPHOS 90 79 88 76  BILITOT 0.7 0.6 0.6  --   PROT 6.3* 5.7* 6.1*  --   ALBUMIN 3.1* 2.8* 2.8*  --     Recent Labs  06/27/15 1215  LIPASE 19    Recent Labs  06/30/15 1115  AMMONIA 25   CBC:  Recent Labs  06/29/15 0430 06/30/15 0435 07/06/15 07/26/15 0751  WBC 8.7 7.3 8.5 6.3  NEUTROABS 7.0 5.4  --  4.5  HGB 10.9* 11.4* 12.0 12.0  HCT 35.5* 37.1 35* 36.3  MCV 93.4 93.7  --  88.3  PLT 135* 137*  219 145*   Lipid Panel: No results for input(s): CHOL, HDL, LDLCALC, TRIG, CHOLHDL, LDLDIRECT in the last 8760 hours. Lab Results  Component Value Date   HGBA1C 6.2 06/23/2015    Procedures since last visit: No results found.  Assessment/Plan  Essential hypertension Controlled, continue Amlodipine Valsartan 5/160mg  daily. HCTZ 12.5mg  daily. Torsemide 20mg . 07/22/15 Na 140, K 4.3, Bun 19, creat 0.85  Acid reflux Stable, continue Omeprazole   Hypothyroidism Continue Synthyroid 177mncg  Anxiety and depression Mood is stable, Alprazolam 0.125mg  bid prn. Observe the patient.     Labs/tests ordered:  none  Next appt:  Visit date not found

## 2015-11-18 NOTE — Assessment & Plan Note (Signed)
Mood is stable, Alprazolam 0.125mg  bid prn. Observe the patient.

## 2015-11-18 NOTE — Assessment & Plan Note (Signed)
Stable, continue Omeprazole.  

## 2015-11-19 ENCOUNTER — Encounter: Payer: Self-pay | Admitting: Nurse Practitioner

## 2015-11-22 ENCOUNTER — Encounter: Payer: Self-pay | Admitting: Nurse Practitioner

## 2015-11-23 ENCOUNTER — Other Ambulatory Visit: Payer: Self-pay

## 2015-11-23 MED ORDER — DIPHENOXYLATE-ATROPINE 2.5-0.025 MG PO TABS: 1.0000 | ORAL_TABLET | Freq: Four times a day (QID) | ORAL | Status: DC | PRN

## 2015-11-23 NOTE — Telephone Encounter (Signed)
Patient contacted office through interface and stated that Rx that was supposed to be sent to pharmacy during [place] on 11/18/15 was never received by pharmacy. After looking up the Rx, it was determined that a transmission error had occurred, so the Rx was printed. After it is signed, it will be faxed to [facility]   Patient has been notified.

## 2015-11-27 ENCOUNTER — Ambulatory Visit (INDEPENDENT_AMBULATORY_CARE_PROVIDER_SITE_OTHER): Payer: Medicare Other | Admitting: Family Medicine

## 2015-11-27 VITALS — BP 106/71 | HR 88 | Resp 18 | Ht 65.0 in | Wt 296.0 lb

## 2015-11-27 DIAGNOSIS — T50905A Adverse effect of unspecified drugs, medicaments and biological substances, initial encounter: Secondary | ICD-10-CM

## 2015-11-27 DIAGNOSIS — R42 Dizziness and giddiness: Secondary | ICD-10-CM

## 2015-11-27 DIAGNOSIS — T887XXA Unspecified adverse effect of drug or medicament, initial encounter: Secondary | ICD-10-CM | POA: Diagnosis not present

## 2015-11-27 DIAGNOSIS — R197 Diarrhea, unspecified: Secondary | ICD-10-CM

## 2015-11-27 NOTE — Progress Notes (Signed)
By signing my name below I, Tereasa Coop, attest that this documentation has been prepared under the direction and in the presence of Delman Cheadle, MD. Electonically Signed. Tereasa Coop, Scribe 11/27/2015 at 10:17 AM  Subjective:    Patient ID: Tamara Morales, female    DOB: 05/10/1931, 82 y.o.   MRN: [id number]  Chief Complaint  Patient presents with  . Allergic Reaction    pt states she took Dipheoxylate with Xanax this morning and started noticing sxs  . Dizziness    states she took the Xanax at 530am today and started feeling bad at 630am  . dry mouth    states having dry mouth was the first thing she noticed  . confusion  . Edema    states her tongue felt thick    HPI Tamara Morales is a 82 y.o. female who presents to the [facility] complaining of dizziness, confusion, dry mouth, difficultly walking, and tongue swelling that started after pt took diphenoxylate with her xanax this morning. Pt states symptoms started 3 hrs before arrival at 0600 hrs this morning. Pt's symptoms are already resolving and pt states that she feels better.   Confusion is described as not being able to make good decisions or think clearly.   Pt has been taking xanax for the past 6 months. Pt has taken lomotil in the past. Pt has never taken lomotil and xanax at the same time and pt thinks that the combination of the medications triggered her symptoms.   Pt took the lomotil last night and again this morning due to onset of diarrhea last night. Pt took the xanax this morning with the lomotil. Pt has history of IBS.  Pt denies nausea, fever, chills, dysuria, change in her urination patterns.   Patient Active Problem List   Diagnosis Date Noted  . IBS (irritable bowel syndrome) 11/18/2015  . Hypokalemia 07/07/2015  . Acid reflux 07/05/2015  . Lobar pneumonia ([place]) 07/02/2015  . Lymphedema of both lower extremities 07/02/2015  . UTI (urinary tract infection) 07/02/2015  .  Encephalopathy   . Ureterolithiasis   . Ureteral stone with hydronephrosis 06/28/2015  . Acute renal failure superimposed on stage 4 chronic kidney disease ([place]) 06/27/2015  . Right ureteral calculus 06/27/2015  . Anxiety and depression 06/27/2015  . History of breast cancer 06/27/2015  . Abdominal pain, RLQ (right lower quadrant) 06/27/2015  . AKI (acute kidney injury) ([place])   . Obesity hypoventilation syndrome ([place]) 11/27/2014  . Morbid obesity ([place]) 10/04/2009  . Hypothyroidism 08/05/2009  . Essential hypertension 08/05/2009    Current Outpatient Prescriptions on File Prior to Visit  Medication Sig Dispense Refill  . ALPRAZolam (XANAX) 0.25 MG tablet Take one tablet by mouth twice daily as needed for anxiety 60 tablet 0  . amLODipine-valsartan (EXFORGE) 5-160 MG tablet Take 1 tablet by mouth daily. 90 tablet 1  . aspirin 81 MG tablet Take 81 mg by mouth daily.     . citalopram (CELEXA) 10 MG tablet Take one tablet by mouth once daily for depression 90 tablet 0  . clotrimazole (LOTRIMIN) 1 % cream Apply 1 application topically 2 (two) times daily. As needed for fungal skin infections 113 g 1  . diphenoxylate-atropine (LOMOTIL) 2.5-0.025 MG tablet Take 1 tablet by mouth 4 (four) times daily as needed for diarrhea or loose stools. 30 tablet 2  . diphenoxylate-atropine (LOMOTIL) 2.5-0.025 MG tablet Take 1 tablet by mouth 4 (four) times daily as needed for diarrhea  or loose stools. 30 tablet 0  . FIBER PO Take 2 tablets by mouth every morning.     . hydrochlorothiazide (HYDRODIURIL) 12.5 MG tablet Take 1 tablet (12.5 mg total) by mouth daily. 90 tablet 1  . isometheptene-acetaminophen-dichloralphenazone (MIDRIN) 65-100-325 MG capsule Take 1 capsule by mouth every 4 (four) hours as needed for migraine. 30 capsule 3  . levothyroxine (SYNTHROID) 175 MCG tablet Take 1 tablet (175 mcg total) by mouth daily before breakfast. 30 tablet 2  . nystatin (MYCOSTATIN/NYSTOP) 100000 UNIT/GM POWD APPLY TO  AFFECTED AREA TWICE A DAY 120 g 2  . Omega-3 Fatty Acids (FISH OIL) 1200 MG CAPS Take 2,400 mg by mouth daily.     [name] omeprazole (PRILOSEC) 20 MG capsule Take 1 capsule (20 mg total) by mouth daily. 90 capsule 0  . potassium chloride (MICRO-K) 10 MEQ CR capsule Take 4 capsules (40 Meq) daily for Potassium 360 capsule 0  . SYNTHROID 200 MCG tablet Take 1 tablet (200 mcg total) by mouth daily before breakfast. 90 tablet 2  . torsemide (DEMADEX) 20 MG tablet Take 1 tablet (20 mg total) by mouth daily. 90 tablet 0  . cetirizine (ZYRTEC) 10 MG tablet Take 10 mg by mouth daily. Reported on 11/27/2015    . fluticasone (FLONASE) 50 MCG/ACT nasal spray Place 2 sprays into both nostrils daily. (Patient not taking: Reported on 11/27/2015) 16 g 2   No current facility-administered medications on file prior to visit.    Allergies  Allergen Reactions  . Azithromycin Itching  . Beta Adrenergic Blockers     Depression   . Ciprofloxacin Other (See Comments)    REACTION: edgy and very jumpy   . Codeine Nausea And Vomiting  . Epinephrine Other (See Comments)    REACTION: rapid pulse, sweats  . Klonopin [Clonazepam] Other (See Comments)    Makes her feel very suicidal   . Oxycodone Other (See Comments)    Patient felt like it altered her mental status "Crazy" . Hallucinations later as well.  [name] Paxil [Paroxetine Hydrochloride] Other (See Comments)    Sever depression   . Prednisone     "makes me feel really bad"  . Propoxyphene Hcl Nausea And Vomiting  . Tramadol Other (See Comments)    Feels "jittery"  . Meloxicam Diarrhea  . Vancomycin Rash    Localized rash related to infusion rate.    Depression screen [facility] 2/9 11/27/2015 11/18/2015 07/29/2015 07/28/2015 07/03/2015  Decreased Interest 0 0 0 0 0  Down, Depressed, Hopeless 0 0 1 0 0  PHQ - 2 Score 0 0 1 0 0        Review of Systems  Constitutional: Negative for fever and chills.  HENT: Negative for congestion.   Eyes: Negative for visual  disturbance.  Respiratory: Negative for cough.   Cardiovascular: Negative for chest pain.  Gastrointestinal: Positive for diarrhea. Negative for nausea.  Genitourinary: Negative for dysuria.  Musculoskeletal: Negative for back pain.  Skin: Negative for rash.  Psychiatric/Behavioral: Positive for confusion (resolving).       Objective:  BP 106/71 mmHg  Pulse 88  Resp 18  Ht 5\' 5"  (1.651 m)  Wt 296 lb (134.265 kg)  BMI 49.26 kg/m2  SpO2 94%  Physical Exam  Constitutional: She is oriented to person, place, and time. She appears well-developed and well-nourished. No distress.  HENT:  Head: Normocephalic and atraumatic.  Right Ear: Hearing, tympanic membrane, external ear and ear canal normal.  Left Ear: Hearing, tympanic membrane, external  ear and ear canal normal.  Nose: Nose normal.  Mouth/Throat: Oropharynx is clear and moist and mucous membranes are normal. No oropharyngeal exudate or posterior oropharyngeal erythema.  Eyes: Conjunctivae are normal. Pupils are equal, round, and reactive to light.  Neck: Neck supple. No thyromegaly present.  Cardiovascular: Normal rate, regular rhythm and normal heart sounds.  Exam reveals no gallop and no friction rub.   No murmur heard. Pulmonary/Chest: Effort normal and breath sounds normal. No accessory muscle usage. No respiratory distress. She has no decreased breath sounds. She has no wheezes. She has no rhonchi. She has no rales.  Musculoskeletal: Normal range of motion.  Lymphadenopathy:    She has no cervical adenopathy.  Neurological: She is alert and oriented to person, place, and time.  Skin: Skin is warm and dry.  Psychiatric: She has a normal mood and affect. Her behavior is normal.  Nursing note and vitals reviewed.         Assessment & Plan:   1. Medication side effect, initial encounter   Pt was concerned she had accidentally od'd when she took an alprazolam and a lomotil together this morning - the combo does increase  risk for CNS and resp depression with psychomotor impairment. Fortunately, sxs resolved while she was in our waiting room and she now feels back to baseline, nml exam and mentation. Pt plans to separate the two medications by >2-4 hrs going forwards   I personally performed the services described in this documentation, which was scribed in my presence. The recorded information has been reviewed and considered, and addended by me as needed.   Delman Cheadle, M.D.  [facility] [address] [place], [place] [address] [phone] phone [phone] fax  12/17/2015 7:24 AM

## 2015-11-27 NOTE — Patient Instructions (Signed)
     IF you received an x-ray today, you will receive an invoice from [facility]. Please contact [facility] at [phone] with questions or concerns regarding your invoice.   IF you received labwork today, you will receive an invoice from [facility]. Please contact [facility] at [phone] with questions or concerns regarding your invoice.   Our billing staff will not be able to assist you with questions regarding bills from these companies.  You will be contacted with the lab results as soon as they are available. The fastest way to get your results is to activate your My Chart account. Instructions are located on the last page of this paperwork. If you have not heard from us regarding the results in 2 weeks, please contact this office.      

## 2015-12-01 DIAGNOSIS — M25511 Pain in right shoulder: Secondary | ICD-10-CM | POA: Diagnosis not present

## 2015-12-01 DIAGNOSIS — R29898 Other symptoms and signs involving the musculoskeletal system: Secondary | ICD-10-CM | POA: Diagnosis not present

## 2015-12-01 DIAGNOSIS — M6281 Muscle weakness (generalized): Secondary | ICD-10-CM | POA: Diagnosis not present

## 2015-12-01 DIAGNOSIS — M25512 Pain in left shoulder: Secondary | ICD-10-CM | POA: Diagnosis not present

## 2015-12-03 DIAGNOSIS — R29898 Other symptoms and signs involving the musculoskeletal system: Secondary | ICD-10-CM | POA: Diagnosis not present

## 2015-12-03 DIAGNOSIS — M6281 Muscle weakness (generalized): Secondary | ICD-10-CM | POA: Diagnosis not present

## 2015-12-03 DIAGNOSIS — M25511 Pain in right shoulder: Secondary | ICD-10-CM | POA: Diagnosis not present

## 2015-12-03 DIAGNOSIS — M25512 Pain in left shoulder: Secondary | ICD-10-CM | POA: Diagnosis not present

## 2015-12-06 ENCOUNTER — Encounter: Payer: Self-pay | Admitting: Nurse Practitioner

## 2015-12-07 ENCOUNTER — Other Ambulatory Visit: Payer: Self-pay | Admitting: *Deleted

## 2015-12-07 DIAGNOSIS — M6281 Muscle weakness (generalized): Secondary | ICD-10-CM | POA: Diagnosis not present

## 2015-12-07 DIAGNOSIS — M25511 Pain in right shoulder: Secondary | ICD-10-CM | POA: Diagnosis not present

## 2015-12-07 DIAGNOSIS — M25512 Pain in left shoulder: Secondary | ICD-10-CM | POA: Diagnosis not present

## 2015-12-07 DIAGNOSIS — R29898 Other symptoms and signs involving the musculoskeletal system: Secondary | ICD-10-CM | POA: Diagnosis not present

## 2015-12-07 MED ORDER — LEVOTHYROXINE SODIUM 175 MCG PO TABS: 175.0000 ug | ORAL_TABLET | Freq: Every day | ORAL | Status: DC

## 2015-12-07 NOTE — Telephone Encounter (Signed)
Received myChart message from patient wanting to know why Synthroid 219mcg was faxed to pharmacy instead of her 185mcg she is currently taking. I reviewed lab (prior lab was dated 08/2015) and I reviewed [facility]'s last OV noted dated 11/18/15 and it stated to continue the 181mcg. Sent my Chart message back to patient and faxed corrected Rx to pharmacy.

## 2015-12-09 DIAGNOSIS — M6281 Muscle weakness (generalized): Secondary | ICD-10-CM | POA: Diagnosis not present

## 2015-12-09 DIAGNOSIS — M25511 Pain in right shoulder: Secondary | ICD-10-CM | POA: Diagnosis not present

## 2015-12-09 DIAGNOSIS — M25512 Pain in left shoulder: Secondary | ICD-10-CM | POA: Diagnosis not present

## 2015-12-09 DIAGNOSIS — R29898 Other symptoms and signs involving the musculoskeletal system: Secondary | ICD-10-CM | POA: Diagnosis not present

## 2015-12-10 DIAGNOSIS — R29898 Other symptoms and signs involving the musculoskeletal system: Secondary | ICD-10-CM | POA: Diagnosis not present

## 2015-12-10 DIAGNOSIS — M25511 Pain in right shoulder: Secondary | ICD-10-CM | POA: Diagnosis not present

## 2015-12-10 DIAGNOSIS — M6281 Muscle weakness (generalized): Secondary | ICD-10-CM | POA: Diagnosis not present

## 2015-12-10 DIAGNOSIS — M25512 Pain in left shoulder: Secondary | ICD-10-CM | POA: Diagnosis not present

## 2015-12-13 DIAGNOSIS — M25512 Pain in left shoulder: Secondary | ICD-10-CM | POA: Diagnosis not present

## 2015-12-13 DIAGNOSIS — R29898 Other symptoms and signs involving the musculoskeletal system: Secondary | ICD-10-CM | POA: Diagnosis not present

## 2015-12-13 DIAGNOSIS — M6281 Muscle weakness (generalized): Secondary | ICD-10-CM | POA: Diagnosis not present

## 2015-12-13 DIAGNOSIS — M25511 Pain in right shoulder: Secondary | ICD-10-CM | POA: Diagnosis not present

## 2015-12-14 DIAGNOSIS — L820 Inflamed seborrheic keratosis: Secondary | ICD-10-CM | POA: Diagnosis not present

## 2015-12-16 ENCOUNTER — Telehealth: Payer: Self-pay | Admitting: Pulmonary Disease

## 2015-12-16 DIAGNOSIS — M25511 Pain in right shoulder: Secondary | ICD-10-CM | POA: Diagnosis not present

## 2015-12-16 DIAGNOSIS — M25512 Pain in left shoulder: Secondary | ICD-10-CM | POA: Diagnosis not present

## 2015-12-16 DIAGNOSIS — M6281 Muscle weakness (generalized): Secondary | ICD-10-CM | POA: Diagnosis not present

## 2015-12-16 DIAGNOSIS — R29898 Other symptoms and signs involving the musculoskeletal system: Secondary | ICD-10-CM | POA: Diagnosis not present

## 2015-12-16 NOTE — Telephone Encounter (Signed)
Per Apolonio Schneiders @ [facility] pt has ordered CPAP cushions 10/29/15 and received. Pt called again this morning to order more cushions but was advised that ins only covers these every 3 months. She is covered to get a new mask so they did order that for pt. Per ins pt not eligible to receive new cushions until 01/29/16.   LMTCB

## 2015-12-16 NOTE — Telephone Encounter (Signed)
Pt returning call.Tamara Morales ° °

## 2015-12-16 NOTE — Telephone Encounter (Signed)
Called spoke with pt and made aware of below. She verbalized understanding and had nothing further

## 2015-12-17 DIAGNOSIS — M25512 Pain in left shoulder: Secondary | ICD-10-CM | POA: Diagnosis not present

## 2015-12-17 DIAGNOSIS — M25511 Pain in right shoulder: Secondary | ICD-10-CM | POA: Diagnosis not present

## 2015-12-17 DIAGNOSIS — R29898 Other symptoms and signs involving the musculoskeletal system: Secondary | ICD-10-CM | POA: Diagnosis not present

## 2015-12-17 DIAGNOSIS — M6281 Muscle weakness (generalized): Secondary | ICD-10-CM | POA: Diagnosis not present

## 2015-12-18 ENCOUNTER — Other Ambulatory Visit: Payer: Self-pay | Admitting: Family Medicine

## 2015-12-20 ENCOUNTER — Other Ambulatory Visit: Payer: Self-pay | Admitting: Nurse Practitioner

## 2015-12-20 DIAGNOSIS — M25511 Pain in right shoulder: Secondary | ICD-10-CM | POA: Diagnosis not present

## 2015-12-20 DIAGNOSIS — M6281 Muscle weakness (generalized): Secondary | ICD-10-CM | POA: Diagnosis not present

## 2015-12-20 DIAGNOSIS — M25512 Pain in left shoulder: Secondary | ICD-10-CM | POA: Diagnosis not present

## 2015-12-20 DIAGNOSIS — R29898 Other symptoms and signs involving the musculoskeletal system: Secondary | ICD-10-CM | POA: Diagnosis not present

## 2015-12-22 DIAGNOSIS — M6281 Muscle weakness (generalized): Secondary | ICD-10-CM | POA: Diagnosis not present

## 2015-12-22 DIAGNOSIS — M25511 Pain in right shoulder: Secondary | ICD-10-CM | POA: Diagnosis not present

## 2015-12-22 DIAGNOSIS — M25512 Pain in left shoulder: Secondary | ICD-10-CM | POA: Diagnosis not present

## 2015-12-22 DIAGNOSIS — R29898 Other symptoms and signs involving the musculoskeletal system: Secondary | ICD-10-CM | POA: Diagnosis not present

## 2015-12-23 ENCOUNTER — Other Ambulatory Visit: Payer: Self-pay

## 2015-12-23 ENCOUNTER — Other Ambulatory Visit: Payer: Self-pay | Admitting: *Deleted

## 2015-12-23 DIAGNOSIS — R29898 Other symptoms and signs involving the musculoskeletal system: Secondary | ICD-10-CM | POA: Diagnosis not present

## 2015-12-23 DIAGNOSIS — M25511 Pain in right shoulder: Secondary | ICD-10-CM | POA: Diagnosis not present

## 2015-12-23 DIAGNOSIS — M6281 Muscle weakness (generalized): Secondary | ICD-10-CM | POA: Diagnosis not present

## 2015-12-23 DIAGNOSIS — M25512 Pain in left shoulder: Secondary | ICD-10-CM | POA: Diagnosis not present

## 2015-12-23 MED ORDER — HYDROCHLOROTHIAZIDE 12.5 MG PO TABS: 12.5000 mg | ORAL_TABLET | Freq: Every day | ORAL | Status: DC

## 2015-12-27 DIAGNOSIS — M6281 Muscle weakness (generalized): Secondary | ICD-10-CM | POA: Diagnosis not present

## 2015-12-27 DIAGNOSIS — M25511 Pain in right shoulder: Secondary | ICD-10-CM | POA: Diagnosis not present

## 2015-12-27 DIAGNOSIS — R29898 Other symptoms and signs involving the musculoskeletal system: Secondary | ICD-10-CM | POA: Diagnosis not present

## 2015-12-27 DIAGNOSIS — M25512 Pain in left shoulder: Secondary | ICD-10-CM | POA: Diagnosis not present

## 2015-12-27 DIAGNOSIS — M722 Plantar fascial fibromatosis: Secondary | ICD-10-CM | POA: Diagnosis not present

## 2015-12-29 DIAGNOSIS — R29898 Other symptoms and signs involving the musculoskeletal system: Secondary | ICD-10-CM | POA: Diagnosis not present

## 2015-12-29 DIAGNOSIS — M6281 Muscle weakness (generalized): Secondary | ICD-10-CM | POA: Diagnosis not present

## 2015-12-29 DIAGNOSIS — M25511 Pain in right shoulder: Secondary | ICD-10-CM | POA: Diagnosis not present

## 2015-12-29 DIAGNOSIS — M25512 Pain in left shoulder: Secondary | ICD-10-CM | POA: Diagnosis not present

## 2015-12-31 DIAGNOSIS — M6281 Muscle weakness (generalized): Secondary | ICD-10-CM | POA: Diagnosis not present

## 2015-12-31 DIAGNOSIS — M25512 Pain in left shoulder: Secondary | ICD-10-CM | POA: Diagnosis not present

## 2015-12-31 DIAGNOSIS — R29898 Other symptoms and signs involving the musculoskeletal system: Secondary | ICD-10-CM | POA: Diagnosis not present

## 2015-12-31 DIAGNOSIS — M25511 Pain in right shoulder: Secondary | ICD-10-CM | POA: Diagnosis not present

## 2016-01-03 DIAGNOSIS — M6281 Muscle weakness (generalized): Secondary | ICD-10-CM | POA: Diagnosis not present

## 2016-01-03 DIAGNOSIS — R29898 Other symptoms and signs involving the musculoskeletal system: Secondary | ICD-10-CM | POA: Diagnosis not present

## 2016-01-03 DIAGNOSIS — M25512 Pain in left shoulder: Secondary | ICD-10-CM | POA: Diagnosis not present

## 2016-01-03 DIAGNOSIS — M25511 Pain in right shoulder: Secondary | ICD-10-CM | POA: Diagnosis not present

## 2016-01-10 ENCOUNTER — Other Ambulatory Visit: Payer: Self-pay | Admitting: Family Medicine

## 2016-01-10 ENCOUNTER — Other Ambulatory Visit: Payer: Self-pay | Admitting: Nurse Practitioner

## 2016-01-26 ENCOUNTER — Telehealth: Payer: Self-pay | Admitting: *Deleted

## 2016-01-26 NOTE — Telephone Encounter (Signed)
Received fax order from [facility] [phone] for: L8000 Post Surgical Bras-to hold breast prosthesis. 6  [id number] Non Silicone Breast Prosthesis-for temporary use during healing or for comfort at end of day, lighter weight, cooler for hot weather. 2 (one to wear, one to wash due to dry time) Given to Dr. Nyoka Cowden to review and sign and fax back to Fax: [phone]

## 2016-02-24 ENCOUNTER — Encounter: Payer: Self-pay | Admitting: Nurse Practitioner

## 2016-02-24 ENCOUNTER — Non-Acute Institutional Stay: Payer: Medicare Other | Admitting: Nurse Practitioner

## 2016-02-24 DIAGNOSIS — K589 Irritable bowel syndrome without diarrhea: Secondary | ICD-10-CM

## 2016-02-24 DIAGNOSIS — N179 Acute kidney failure, unspecified: Secondary | ICD-10-CM | POA: Diagnosis not present

## 2016-02-24 DIAGNOSIS — F418 Other specified anxiety disorders: Secondary | ICD-10-CM

## 2016-02-24 DIAGNOSIS — E039 Hypothyroidism, unspecified: Secondary | ICD-10-CM | POA: Diagnosis not present

## 2016-02-24 DIAGNOSIS — I10 Essential (primary) hypertension: Secondary | ICD-10-CM | POA: Diagnosis not present

## 2016-02-24 DIAGNOSIS — K219 Gastro-esophageal reflux disease without esophagitis: Secondary | ICD-10-CM

## 2016-02-24 DIAGNOSIS — E876 Hypokalemia: Secondary | ICD-10-CM | POA: Diagnosis not present

## 2016-02-24 DIAGNOSIS — N184 Chronic kidney disease, stage 4 (severe): Secondary | ICD-10-CM

## 2016-02-24 DIAGNOSIS — F32A Depression, unspecified: Secondary | ICD-10-CM

## 2016-02-24 DIAGNOSIS — F419 Anxiety disorder, unspecified: Secondary | ICD-10-CM

## 2016-02-24 DIAGNOSIS — F329 Major depressive disorder, single episode, unspecified: Secondary | ICD-10-CM

## 2016-02-24 NOTE — Assessment & Plan Note (Signed)
Stable, continue Omeprazole.  

## 2016-02-24 NOTE — Assessment & Plan Note (Signed)
Stable, continue prn Lomotil.

## 2016-02-24 NOTE — Assessment & Plan Note (Signed)
Mood is stable, Alprazolam 0.125mg  bid prn. Observe the patient.

## 2016-02-24 NOTE — Assessment & Plan Note (Signed)
08/31/15 Na 141, K 3.9, Bun 27, creat 0.64 Update CMP

## 2016-02-24 NOTE — Assessment & Plan Note (Signed)
Continue Synthyroid 129mcg since 08/31/15, last TSH 0.03 08/31/15, update TSH

## 2016-02-24 NOTE — Assessment & Plan Note (Signed)
Controlled, continue Amlodipine Valsartan 5/160mg  daily. HCTZ 12.5mg  daily. Torsemide 20mg . Update CMP

## 2016-02-24 NOTE — Assessment & Plan Note (Signed)
08/31/15 K 3.9, continue K 75meq, update CMP

## 2016-02-24 NOTE — Progress Notes (Signed)
Patient ID: Tamara Morales, female   DOB: 12-27-1930, 82 y.o.   MRN: [id number]   Location:   clinic [place]   Place of Service:   clinic [place]  Provider: Marlana Latus NP  Code Status: DNR Goals of Care:  Advanced Directives 02/24/2016  Does patient have an advance directive? Yes  Type of Paramedic of [place];Living will  Does patient want to make changes to advanced directive? No - Patient declined  Copy of advanced directive(s) in chart? Yes  Would patient like information on creating an advanced directive? -  Pre-existing out of facility DNR order (yellow form or pink MOST form) -     Chief Complaint  Patient presents with  . Medical Management of Chronic Issues    6 mo    HPI: Patient is a 82 y.o. female seen today for medical management of chronic diseases.    Hx of kidney stone, off Flomax. Takes HCTZ and Torsemide  for chronic BLE edema, taking Kcl 23meq daily, K 3.9, Bun 17, creat 0.6 08/31/15. HTN controlled on Amlodipine/Valsarta 5/160mg  daily. Depression/anxiety, mood is stable, sleeps well while on Alprazolam prn bid. Hypothyroidism is well supplemented while on Synthroid 175 mcg, TSH 0.03 08/31/15.  Asymptomatic GERD while on Omeprazole 20mg  bid   Past Medical History:  Diagnosis Date  . ARTHRITIS, KNEES, BILATERAL   . Breast CA ([place]) 2000   s/p R lumpectomy and XRT  . DEPRESSION   . DJD (degenerative joint disease) of knee   . GERD   . HYPERLIPIDEMIA   . HYPERTENSION   . HYPOTHYROIDISM    postsurgical  . Hypoxia   . MIGRAINE HEADACHE   . Morbid obesity ([place])   . OSA (obstructive sleep apnea) 01/17/2011 dx  . Oxygen dependent   . Urge incontinence   . UTI (lower urinary tract infection) 12/2014    Past Surgical History:  Procedure Laterality Date  . ABDOMINAL HYSTERECTOMY    . ADENOIDECTOMY    . BREAST BIOPSY  2000  . BREAST LUMPECTOMY  2000  . CATARACT EXTRACTION    . CHOLECYSTECTOMY  1995  . CYSTOSCOPY/URETEROSCOPY/HOLMIUM  LASER Right 06/28/2015   Procedure: CYSTOSCOPY RIGHT RETROGRAD RIGHT URETEROSCOPY/HOLMIUM LASER WITH RIGHT STENT PLACEMENT;  Surgeon: Alexis Frock, MD;  Location: [facility];  Service: Urology;  Laterality: Right;  . PARATHYROIDECTOMY     2-3 removed  . REPLACEMENT TOTAL KNEE BILATERAL  1999, 2005  . Right leg femur repaired    . THYROIDECTOMY    . TONSILLECTOMY    . TUBAL LIGATION      Allergies  Allergen Reactions  . Azithromycin Itching  . Beta Adrenergic Blockers     Depression   . Ciprofloxacin Other (See Comments)    REACTION: edgy and very jumpy   . Codeine Nausea And Vomiting  . Epinephrine Other (See Comments)    REACTION: rapid pulse, sweats  . Klonopin [Clonazepam] Other (See Comments)    Makes her feel very suicidal   . Oxycodone Other (See Comments)    Patient felt like it altered her mental status "Crazy" . Hallucinations later as well.  [name] Paxil [Paroxetine Hydrochloride] Other (See Comments)    Sever depression   . Prednisone     "makes me feel really bad"  . Propoxyphene Hcl Nausea And Vomiting  . Tramadol Other (See Comments)    Feels "jittery"  . Meloxicam Diarrhea  . Vancomycin Rash    Localized rash related to infusion rate.  Medication List       Accurate as of 02/24/16  2:50 PM. Always use your most recent med list.          ALPRAZolam 0.25 MG tablet Commonly known as:  XANAX Take one tablet by mouth twice daily as needed for anxiety   amLODipine-valsartan 5-160 MG tablet Commonly known as:  EXFORGE TAKE 1 TABLET ONCE DAILY.   aspirin 81 MG tablet Take 81 mg by mouth daily.   cetirizine 10 MG tablet Commonly known as:  ZYRTEC Take 10 mg by mouth daily. Reported on 11/27/2015   citalopram 10 MG tablet Commonly known as:  CELEXA Take one tablet by mouth once daily for depression   clotrimazole 1 % cream Commonly known as:  LOTRIMIN Apply 1 application topically 2 (two) times daily. As needed for fungal skin infections     diphenoxylate-atropine 2.5-0.025 MG tablet Commonly known as:  LOMOTIL Take 1 tablet by mouth 4 (four) times daily as needed for diarrhea or loose stools.   diphenoxylate-atropine 2.5-0.025 MG tablet Commonly known as:  LOMOTIL Take 1 tablet by mouth 4 (four) times daily as needed for diarrhea or loose stools.   FIBER PO Take 2 tablets by mouth every morning.   Fish Oil 1200 MG Caps Take 2,400 mg by mouth daily.   fluticasone 50 MCG/ACT nasal spray Commonly known as:  FLONASE Place 2 sprays into both nostrils daily.   hydrochlorothiazide 12.5 MG tablet Commonly known as:  HYDRODIURIL Take 1 tablet (12.5 mg total) by mouth daily.   isometheptene-acetaminophen-dichloralphenazone 65-100-325 MG capsule Commonly known as:  MIDRIN Take 1 capsule by mouth every 4 (four) hours as needed for migraine.   levothyroxine 175 MCG tablet Commonly known as:  SYNTHROID Take 1 tablet (175 mcg total) by mouth daily before breakfast.   nystatin powder Generic drug:  nystatin APPLY TO AFFECTED AREA TWICE A DAY   omeprazole 20 MG capsule Commonly known as:  PRILOSEC Take 1 capsule (20 mg total) by mouth daily.   potassium chloride 10 MEQ CR capsule Commonly known as:  MICRO-K Take 4 capsules (40 Meq) daily for Potassium   torsemide 20 MG tablet Commonly known as:  DEMADEX Take 1 tablet (20 mg total) by mouth daily.       Review of Systems:  Review of Systems  Constitutional:       Obese  HENT: Negative for congestion, ear pain, hearing loss, sore throat and tinnitus.   Eyes: Negative for photophobia, pain and redness.  Respiratory: Negative for cough and shortness of breath.   Cardiovascular: Positive for leg swelling. Negative for chest pain and palpitations.       Chronic BLE, dermatosclerosis changes  Gastrointestinal: Negative for abdominal pain, blood in stool, constipation, diarrhea, nausea and vomiting.  Genitourinary: Positive for frequency. Negative for dysuria, flank  pain, hematuria and urgency.       The right   Musculoskeletal: Positive for back pain. Negative for myalgias.  Skin: Negative for rash.  Neurological: Negative for dizziness, weakness and headaches.  Psychiatric/Behavioral: The patient is nervous/anxious.     Health Maintenance  Topic Date Due  . DEXA SCAN  09/07/1997  . INFLUENZA VACCINE  01/18/2016  . TETANUS/TDAP  10/05/2019  . ZOSTAVAX  Completed  . PNA vac Low Risk Adult  Completed    Physical Exam: Vitals:   02/24/16 1426  BP: 120/72  Pulse: 92  Resp: 20  Temp: 97.6 F (36.4 C)  TempSrc: Oral  Weight: 298 lb 3.2  oz (135.3 kg)  Height: $Remove'5\' 5"'BNWCSdH$  (1.651 m)   Body mass index is 49.62 kg/m. Physical Exam  Constitutional: She is oriented to person, place, and time. She appears well-developed and well-nourished. She is active and cooperative. No distress.  BP 144/81 mmHg  Pulse 87  Temp(Src) 98 F (36.7 C) (Oral)  Resp 17  SpO2 95%  HENT:  Head: Normocephalic and atraumatic.  Right Ear: Hearing and external ear normal.  Left Ear: Hearing and external ear normal.  Nose: Nose normal.  Mouth/Throat: Oropharynx is clear and moist.  Eyes: Conjunctivae are normal. No scleral icterus.  Neck: Normal range of motion. Neck supple. No thyromegaly present.  Cardiovascular: Normal rate, regular rhythm, normal heart sounds and intact distal pulses.   Pulses:      Radial pulses are 2+ on the right side, and 2+ on the left side.       Dorsalis pedis pulses are 1+ on the right side, and 1+ on the left side.  Pulmonary/Chest: Effort normal and breath sounds normal.  Abdominal: Soft. There is no tenderness.  Musculoskeletal: She exhibits edema.       Lumbar back: She exhibits decreased range of motion and pain. She exhibits no tenderness, no bony tenderness, no swelling and no edema.       Back:  Electric w/c for mobility Chronic BLE dermatosclerosis, trace edema   Lymphadenopathy:       Head (right side): No tonsillar, no  preauricular, no posterior auricular and no occipital adenopathy present.       Head (left side): No tonsillar, no preauricular, no posterior auricular and no occipital adenopathy present.    She has no cervical adenopathy.       Right: No supraclavicular adenopathy present.       Left: No supraclavicular adenopathy present.  Both lower legs with substantial swelling and skin changes consistent with the chronic swelling.  Neurological: She is alert and oriented to person, place, and time. No sensory deficit.  Skin: Skin is warm, dry and intact. No cyanosis or erythema. Nails show no clubbing.  Psychiatric: She has a normal mood and affect. Her speech is normal and behavior is normal.    Labs reviewed: Basic Metabolic Panel:  Recent Labs  06/23/15 1918  06/29/15 0430 06/30/15 0435  07/13/15 07/26/15 0751 08/31/15  NA  --   < > 140 140  < > 140 140 141  K  --   < > 3.9 4.2  < > 4.3 3.6 3.9  CL  --   < > 106 105  --   --  101  --   CO2  --   < > 27 29  --   --  27  --   GLUCOSE  --   < > 163* 113*  --   --  127*  --   BUN  --   < > 22* 16  < > $R'19 15 17  'rb$ CREATININE  --   < > 1.12* 1.00  < > 0.8 0.80 0.6  CALCIUM  --   < > 7.4* 7.6*  --   --  8.3*  --   TSH 0.768  --   --   --   --   --   --  0.03*  < > = values in this interval not displayed. Liver Function Tests:  Recent Labs  06/28/15 0446 06/29/15 0430 06/30/15 0435 07/06/15  AST 40 33 49* 12*  ALT 33 28 32 15  ALKPHOS 90 79 88 76  BILITOT 0.7 0.6 0.6  --   PROT 6.3* 5.7* 6.1*  --   ALBUMIN 3.1* 2.8* 2.8*  --     Recent Labs  06/27/15 1215  LIPASE 19    Recent Labs  06/30/15 1115  AMMONIA 25   CBC:  Recent Labs  06/29/15 0430 06/30/15 0435 07/06/15 07/26/15 0751  WBC 8.7 7.3 8.5 6.3  NEUTROABS 7.0 5.4  --  4.5  HGB 10.9* 11.4* 12.0 12.0  HCT 35.5* 37.1 35* 36.3  MCV 93.4 93.7  --  88.3  PLT 135* 137* 219 145*   Lipid Panel: No results for input(s): CHOL, HDL, LDLCALC, TRIG, CHOLHDL, LDLDIRECT in  the last 8760 hours. Lab Results  Component Value Date   HGBA1C 6.2 06/23/2015    Procedures since last visit: No results found.  Assessment/Plan  Essential hypertension Controlled, continue Amlodipine Valsartan 5/160mg  daily. HCTZ 12.5mg  daily. Torsemide 20mg . Update CMP   Acid reflux Stable, continue Omeprazole   IBS (irritable bowel syndrome) Stable, continue prn Lomotil.   Hypothyroidism Continue Synthyroid 170mcg since 08/31/15, last TSH 0.03 08/31/15, update TSH   Acute renal failure superimposed on stage 4 chronic kidney disease ([place]) 08/31/15 Na 141, K 3.9, Bun 27, creat 0.64 Update CMP  Anxiety and depression Mood is stable, Alprazolam 0.125mg  bid prn. Observe the patient.    Hypokalemia 08/31/15 K 3.9, continue K 18meq, update CMP   Labs/tests ordered:  CBC, CMP, TSH   Next appt:  4 months

## 2016-02-29 ENCOUNTER — Encounter: Payer: Self-pay | Admitting: Internal Medicine

## 2016-02-29 DIAGNOSIS — E039 Hypothyroidism, unspecified: Secondary | ICD-10-CM | POA: Diagnosis not present

## 2016-02-29 DIAGNOSIS — I10 Essential (primary) hypertension: Secondary | ICD-10-CM | POA: Diagnosis not present

## 2016-02-29 DIAGNOSIS — F418 Other specified anxiety disorders: Secondary | ICD-10-CM | POA: Diagnosis not present

## 2016-02-29 LAB — CBC AND DIFFERENTIAL
HCT: 37 % (ref 36–46)
HEMOGLOBIN: 12.4 g/dL (ref 12.0–16.0)
Platelets: 170 10*3/uL (ref 150–399)
WBC: 5.6 10*3/mL

## 2016-02-29 LAB — BASIC METABOLIC PANEL
BUN: 22 mg/dL — AB (ref 4–21)
CREATININE: 0.9 mg/dL (ref <=1.1)
Glucose: 108 mg/dL
POTASSIUM: 3.7 mmol/L (ref 3.4–5.3)
SODIUM: 141 mmol/L (ref 137–147)

## 2016-02-29 LAB — HEPATIC FUNCTION PANEL
ALT: 11 U/L (ref 7–35)
AST: 13 U/L (ref 13–35)
Alkaline Phosphatase: 80 U/L (ref 25–125)
Bilirubin, Total: 0.5 mg/dL

## 2016-02-29 LAB — TSH: TSH: 0.04 u[IU]/mL — AB (ref 0.41–5.90)

## 2016-03-05 ENCOUNTER — Other Ambulatory Visit: Payer: Self-pay | Admitting: Nurse Practitioner

## 2016-03-05 DIAGNOSIS — I10 Essential (primary) hypertension: Secondary | ICD-10-CM

## 2016-03-06 ENCOUNTER — Other Ambulatory Visit: Payer: Self-pay | Admitting: *Deleted

## 2016-03-06 MED ORDER — LEVOTHYROXINE SODIUM 150 MCG PO TABS: 150.0000 ug | ORAL_TABLET | Freq: Every day | ORAL | 3 refills | Status: DC

## 2016-03-07 ENCOUNTER — Telehealth: Payer: Self-pay | Admitting: *Deleted

## 2016-03-07 NOTE — Telephone Encounter (Signed)
Spoke with the patient regarding her lab results and informed her that her medication had been sent to her pharmacy. She stated that she understood and had received her medication from the pharmacy.

## 2016-03-17 ENCOUNTER — Emergency Department (HOSPITAL_COMMUNITY): Payer: Medicare Other

## 2016-03-17 ENCOUNTER — Emergency Department (HOSPITAL_COMMUNITY)
Admission: EM | Admit: 2016-03-17 | Discharge: 2016-03-17 | Disposition: A | Discharge status: Home / Self Care | Payer: Medicare Other | Attending: Emergency Medicine | Admitting: Emergency Medicine

## 2016-03-17 ENCOUNTER — Encounter (HOSPITAL_COMMUNITY): Payer: Self-pay | Admitting: Emergency Medicine

## 2016-03-17 DIAGNOSIS — Z96653 Presence of artificial knee joint, bilateral: Secondary | ICD-10-CM | POA: Diagnosis not present

## 2016-03-17 DIAGNOSIS — Y939 Activity, unspecified: Secondary | ICD-10-CM | POA: Diagnosis not present

## 2016-03-17 DIAGNOSIS — Z853 Personal history of malignant neoplasm of breast: Secondary | ICD-10-CM | POA: Insufficient documentation

## 2016-03-17 DIAGNOSIS — S0990XA Unspecified injury of head, initial encounter: Secondary | ICD-10-CM | POA: Diagnosis not present

## 2016-03-17 DIAGNOSIS — R030 Elevated blood-pressure reading, without diagnosis of hypertension: Secondary | ICD-10-CM | POA: Diagnosis not present

## 2016-03-17 DIAGNOSIS — Y999 Unspecified external cause status: Secondary | ICD-10-CM | POA: Insufficient documentation

## 2016-03-17 DIAGNOSIS — Y929 Unspecified place or not applicable: Secondary | ICD-10-CM | POA: Diagnosis not present

## 2016-03-17 DIAGNOSIS — E039 Hypothyroidism, unspecified: Secondary | ICD-10-CM | POA: Insufficient documentation

## 2016-03-17 DIAGNOSIS — W19XXXA Unspecified fall, initial encounter: Secondary | ICD-10-CM

## 2016-03-17 DIAGNOSIS — S199XXA Unspecified injury of neck, initial encounter: Secondary | ICD-10-CM | POA: Diagnosis not present

## 2016-03-17 DIAGNOSIS — S4991XA Unspecified injury of right shoulder and upper arm, initial encounter: Secondary | ICD-10-CM | POA: Diagnosis not present

## 2016-03-17 DIAGNOSIS — W0110XA Fall on same level from slipping, tripping and stumbling with subsequent striking against unspecified object, initial encounter: Secondary | ICD-10-CM | POA: Insufficient documentation

## 2016-03-17 DIAGNOSIS — R930 Abnormal findings on diagnostic imaging of skull and head, not elsewhere classified: Secondary | ICD-10-CM | POA: Insufficient documentation

## 2016-03-17 DIAGNOSIS — M79621 Pain in right upper arm: Secondary | ICD-10-CM | POA: Diagnosis not present

## 2016-03-17 DIAGNOSIS — S8001XA Contusion of right knee, initial encounter: Secondary | ICD-10-CM | POA: Diagnosis not present

## 2016-03-17 DIAGNOSIS — Z87891 Personal history of nicotine dependence: Secondary | ICD-10-CM | POA: Insufficient documentation

## 2016-03-17 DIAGNOSIS — M25561 Pain in right knee: Secondary | ICD-10-CM | POA: Diagnosis not present

## 2016-03-17 DIAGNOSIS — S8991XA Unspecified injury of right lower leg, initial encounter: Secondary | ICD-10-CM | POA: Diagnosis not present

## 2016-03-17 DIAGNOSIS — R51 Headache: Secondary | ICD-10-CM | POA: Diagnosis not present

## 2016-03-17 DIAGNOSIS — S79911A Unspecified injury of right hip, initial encounter: Secondary | ICD-10-CM | POA: Diagnosis not present

## 2016-03-17 DIAGNOSIS — I10 Essential (primary) hypertension: Secondary | ICD-10-CM | POA: Diagnosis not present

## 2016-03-17 DIAGNOSIS — Z7982 Long term (current) use of aspirin: Secondary | ICD-10-CM | POA: Diagnosis not present

## 2016-03-17 DIAGNOSIS — M79651 Pain in right thigh: Secondary | ICD-10-CM | POA: Diagnosis not present

## 2016-03-17 DIAGNOSIS — M542 Cervicalgia: Secondary | ICD-10-CM | POA: Diagnosis not present

## 2016-03-17 MED ORDER — HYDROCODONE-ACETAMINOPHEN 5-325 MG PO TABS: 1.0000 | ORAL_TABLET | Freq: Four times a day (QID) | ORAL | 0 refills | Status: DC | PRN

## 2016-03-17 NOTE — ED Triage Notes (Signed)
Per EMS. Pt from [facility]. Pt had mechanical fall. Hit head, knee and upper arm. EMS noted bruise on R knee. Has chronic lymphedema in bilateral legs which is normal for her. Alert and oriented 4x.

## 2016-03-17 NOTE — ED Provider Notes (Signed)
[facility] DEPT Provider Note   CSN: [id number] Arrival date & time: 03/17/16  1714     History   Chief Complaint Chief Complaint  Patient presents with  . Fall    HPI Tamara Morales is a 82 y.o. female.  82 year old female with past medical history below who presents after a fall. Just prior to arrival, the patient was trying to transfer from her recliner to her motorized chair when her hand slipped, causing her to fall forward. She struck her forehead, right upper arm, and right knee during the fall. She did not lose consciousness. Her pain is constant and most severe in her right knee. She is having mild pain in her forehead and right face as well as her right upper arm. She denies any chest, abdominal, back, neck, or other extremity pain. No visual changes or extremity numbness. No anticoagulant use.   The history is provided by the patient.    Past Medical History:  Diagnosis Date  . ARTHRITIS, KNEES, BILATERAL   . Breast CA ([place]) 2000   s/p R lumpectomy and XRT  . DEPRESSION   . DJD (degenerative joint disease) of knee   . GERD   . HYPERLIPIDEMIA   . HYPERTENSION   . HYPOTHYROIDISM    postsurgical  . Hypoxia   . MIGRAINE HEADACHE   . Morbid obesity ([place])   . OSA (obstructive sleep apnea) 01/17/2011 dx  . Oxygen dependent   . Urge incontinence   . UTI (lower urinary tract infection) 12/2014    Patient Active Problem List   Diagnosis Date Noted  . IBS (irritable bowel syndrome) 11/18/2015  . Hypokalemia 07/07/2015  . Acid reflux 07/05/2015  . Lobar pneumonia ([place]) 07/02/2015  . Lymphedema of both lower extremities 07/02/2015  . UTI (urinary tract infection) 07/02/2015  . Encephalopathy   . Ureterolithiasis   . Ureteral stone with hydronephrosis 06/28/2015  . Acute renal failure superimposed on stage 4 chronic kidney disease ([place]) 06/27/2015  . Right ureteral calculus 06/27/2015  . Anxiety and depression 06/27/2015  . History of breast cancer  06/27/2015  . Abdominal pain, RLQ (right lower quadrant) 06/27/2015  . AKI (acute kidney injury) ([place])   . Obesity hypoventilation syndrome ([place]) 11/27/2014  . Morbid obesity ([place]) 10/04/2009  . Hypothyroidism 08/05/2009  . Essential hypertension 08/05/2009    Past Surgical History:  Procedure Laterality Date  . ABDOMINAL HYSTERECTOMY    . ADENOIDECTOMY    . BREAST BIOPSY  2000  . BREAST LUMPECTOMY  2000  . CATARACT EXTRACTION    . CHOLECYSTECTOMY  1995  . CYSTOSCOPY/URETEROSCOPY/HOLMIUM LASER Right 06/28/2015   Procedure: CYSTOSCOPY RIGHT RETROGRAD RIGHT URETEROSCOPY/HOLMIUM LASER WITH RIGHT STENT PLACEMENT;  Surgeon: Alexis Frock, MD;  Location: [facility];  Service: Urology;  Laterality: Right;  . PARATHYROIDECTOMY     2-3 removed  . REPLACEMENT TOTAL KNEE BILATERAL  1999, 2005  . Right leg femur repaired    . THYROIDECTOMY    . TONSILLECTOMY    . TUBAL LIGATION      OB History    Gravida Para Term Preterm AB Living   5 5           SAB TAB Ectopic Multiple Live Births                   Home Medications    Prior to Admission medications   Medication Sig Start Date End Date Taking? Authorizing Provider  ALPRAZolam Duanne Moron) 0.25 MG tablet Take one tablet by  mouth twice daily as needed for anxiety 11/18/15   Man X Mast, NP  amLODipine-valsartan (EXFORGE) 5-160 MG tablet TAKE 1 TABLET ONCE DAILY. 01/11/16   Estill Dooms, MD  aspirin 81 MG tablet Take 81 mg by mouth daily.     Historical Provider, MD  cetirizine (ZYRTEC) 10 MG tablet Take 10 mg by mouth daily. Reported on 11/27/2015    Historical Provider, MD  citalopram (CELEXA) 10 MG tablet Take one tablet by mouth once daily for depression 11/18/15   Man X Mast, NP  clotrimazole (LOTRIMIN) 1 % cream Apply 1 application topically 2 (two) times daily. As needed for fungal skin infections 04/02/15   Marin Olp, MD  diphenoxylate-atropine (LOMOTIL) 2.5-0.025 MG tablet Take 1 tablet by mouth 4 (four) times daily as needed for  diarrhea or loose stools. 11/18/15   Man X Mast, NP  diphenoxylate-atropine (LOMOTIL) 2.5-0.025 MG tablet Take 1 tablet by mouth 4 (four) times daily as needed for diarrhea or loose stools. 11/23/15   Lauree Chandler, NP  FIBER PO Take 2 tablets by mouth every morning.     Historical Provider, MD  fluticasone (FLONASE) 50 MCG/ACT nasal spray Place 2 sprays into both nostrils daily. 01/11/15   Chesley Mires, MD  hydrochlorothiazide (HYDRODIURIL) 12.5 MG tablet Take 1 tablet (12.5 mg total) by mouth daily. 12/23/15   Eulas Post, MD  HYDROcodone-acetaminophen (NORCO/VICODIN) 5-325 MG tablet Take 1 tablet by mouth every 6 (six) hours as needed for severe pain. 03/17/16   Sharlett Iles, MD  isometheptene-acetaminophen-dichloralphenazone (MIDRIN) [phone] MG capsule Take 1 capsule by mouth every 4 (four) hours as needed for migraine. 10/25/15   Man X Mast, NP  levothyroxine (SYNTHROID, LEVOTHROID) 150 MCG tablet Take 1 tablet (150 mcg total) by mouth daily. 03/06/16   Man X Mast, NP  nystatin (MYCOSTATIN/NYSTOP) 100000 UNIT/GM POWD APPLY TO AFFECTED AREA TWICE A DAY 11/25/14   Marin Olp, MD  Omega-3 Fatty Acids (FISH OIL) 1200 MG CAPS Take 2,400 mg by mouth daily.     Historical Provider, MD  omeprazole (PRILOSEC) 20 MG capsule Take 1 capsule (20 mg total) by mouth daily. 11/18/15   Man X Mast, NP  potassium chloride (MICRO-K) 10 MEQ CR capsule TAKE 4 CAPSULES DAILY FOR POTASSIUM 03/06/16   Man X Mast, NP  torsemide (DEMADEX) 20 MG tablet Take 1 tablet (20 mg total) by mouth daily. 11/18/15   Man X Mast, NP    Family History Family History  Problem Relation Age of Onset  . Emphysema Sister   . Coronary artery disease Neg Hx     Social History Social History  Substance Use Topics  . Smoking status: Former Smoker    Packs/day: 0.25    Years: 2.00    Types: Cigarettes    Quit date: 06/19/1974  . Smokeless tobacco: Never Used     Comment: Quit in late 20's  . Alcohol use No     Comment:  rare     Allergies   Azithromycin; Beta adrenergic blockers; Ciprofloxacin; Codeine; Epinephrine; Klonopin [clonazepam]; Oxycodone; Paxil [paroxetine hydrochloride]; Prednisone; Propoxyphene hcl; Tramadol; Meloxicam; and Vancomycin   Review of Systems Review of Systems 10 Systems reviewed and are negative for acute change except as noted in the HPI.   Physical Exam Updated Vital Signs BP 128/73 (BP Location: Left Arm)   Pulse 98   Temp 97.6 F (36.4 C) (Oral)   Resp 18   Ht $R'5\' 6"'mM$  (1.676 m)  Wt 300 lb (136.1 kg)   SpO2 98%   BMI 48.42 kg/m   Physical Exam  Constitutional: She is oriented to person, place, and time. She appears well-developed and well-nourished. No distress.  HENT:  Head: Normocephalic and atraumatic.  Nose: Nose normal.  Mouth/Throat: Oropharynx is clear and moist.  Moist mucous membranes  Eyes: Conjunctivae are normal. Pupils are equal, round, and reactive to light.  Neck: Neck supple.  Cardiovascular: Normal rate, regular rhythm, normal heart sounds and intact distal pulses.   No murmur heard. Pulmonary/Chest: Effort normal and breath sounds normal. She exhibits no tenderness.  Abdominal: Soft. Bowel sounds are normal. She exhibits no distension. There is no tenderness.  Musculoskeletal: She exhibits edema and tenderness.  Mild tenderness of R proximal humerus with no deformity, normal ROM at shoulder, elbow, and wrist; ttp, swelling, and ecchymoses of R knee  Neurological: She is alert and oriented to person, place, and time. No cranial nerve deficit.  Fluent speech Normal strength and sensation x all 4 ext  Skin: Skin is warm and dry.  Ecchymosis R knee  Psychiatric: She has a normal mood and affect. Judgment normal.  Nursing note and vitals reviewed.    ED Treatments / Results  Labs (all labs ordered are listed, but only abnormal results are displayed) Labs Reviewed - No data to display  EKG  EKG Interpretation None        Radiology Ct Head Wo Contrast  Result Date: 03/17/2016 CLINICAL DATA:  Status post mechanical fall, hit head, upper arm pain. EXAM: CT HEAD WITHOUT CONTRAST CT CERVICAL SPINE WITHOUT CONTRAST TECHNIQUE: Multidetector CT imaging of the head and cervical spine was performed following the standard protocol without intravenous contrast. Multiplanar CT image reconstructions of the cervical spine were also generated. COMPARISON:  Head CT dated 11/09/2014. FINDINGS: CT HEAD FINDINGS Brain: Again noted is mild generalized age related parenchymal atrophy with commensurate dilatation of the ventricles and sulci. Mild chronic small vessel ischemic change again noted within the periventricular white matter bilaterally. There is no mass, hemorrhage, edema or other evidence of acute parenchymal abnormality. No extra-axial hemorrhage. Vascular: There are chronic calcified atherosclerotic changes of the large vessels at the skull base. No unexpected hyperdense vessel. Skull: No osseous fracture or dislocation seen. Sinuses/Orbits: No acute finding. Other: None CT CERVICAL SPINE FINDINGS Alignment: Slight retrolistheses of C3 through C6 are likely related to the underlying degenerative changes at these levels. Also mild scoliosis. No evidence of acute subluxation. Skull base and vertebrae: No fracture line or displaced fracture fragment seen. Soft tissues and spinal canal: No prevertebral fluid or swelling. No visible canal hematoma. Atherosclerotic calcifications noted at each carotid bulb region. Disc levels: Degenerative changes at the C4-5 through C7-T1 levels, moderate in degree with associated disc space narrowings and osseous spurring. Associated disc-osteophytic bulges at each level causing minimal to mild central canal stenoses. Upper chest: Unremarkable. Other: None IMPRESSION: 1. No acute intracranial abnormality. No intracranial mass, hemorrhage or edema. No skull fracture. Atrophy and chronic ischemic changes  in the white matter, stable. 2. No fracture or acute subluxation in the cervical spine. Degenerative changes, as detailed above. Electronically Signed   By: Franki Cabot M.D.   On: 03/17/2016 19:45   Ct Cervical Spine Wo Contrast  Result Date: 03/17/2016 CLINICAL DATA:  Status post mechanical fall, hit head, upper arm pain. EXAM: CT HEAD WITHOUT CONTRAST CT CERVICAL SPINE WITHOUT CONTRAST TECHNIQUE: Multidetector CT imaging of the head and cervical spine was performed following the  standard protocol without intravenous contrast. Multiplanar CT image reconstructions of the cervical spine were also generated. COMPARISON:  Head CT dated 11/09/2014. FINDINGS: CT HEAD FINDINGS Brain: Again noted is mild generalized age related parenchymal atrophy with commensurate dilatation of the ventricles and sulci. Mild chronic small vessel ischemic change again noted within the periventricular white matter bilaterally. There is no mass, hemorrhage, edema or other evidence of acute parenchymal abnormality. No extra-axial hemorrhage. Vascular: There are chronic calcified atherosclerotic changes of the large vessels at the skull base. No unexpected hyperdense vessel. Skull: No osseous fracture or dislocation seen. Sinuses/Orbits: No acute finding. Other: None CT CERVICAL SPINE FINDINGS Alignment: Slight retrolistheses of C3 through C6 are likely related to the underlying degenerative changes at these levels. Also mild scoliosis. No evidence of acute subluxation. Skull base and vertebrae: No fracture line or displaced fracture fragment seen. Soft tissues and spinal canal: No prevertebral fluid or swelling. No visible canal hematoma. Atherosclerotic calcifications noted at each carotid bulb region. Disc levels: Degenerative changes at the C4-5 through C7-T1 levels, moderate in degree with associated disc space narrowings and osseous spurring. Associated disc-osteophytic bulges at each level causing minimal to mild central canal  stenoses. Upper chest: Unremarkable. Other: None IMPRESSION: 1. No acute intracranial abnormality. No intracranial mass, hemorrhage or edema. No skull fracture. Atrophy and chronic ischemic changes in the white matter, stable. 2. No fracture or acute subluxation in the cervical spine. Degenerative changes, as detailed above. Electronically Signed   By: Franki Cabot M.D.   On: 03/17/2016 19:45   Dg Knee Complete 4 Views Right  Result Date: 03/17/2016 CLINICAL DATA:  Mechanical fall, knee pain. EXAM: RIGHT KNEE - COMPLETE 4+ VIEW COMPARISON:  None. FINDINGS: Patient is status post intra medullary rod fixation of a distal femur fracture. Patient is also status post right knee arthroplasty. Hardware appears intact and appropriately positioned. Intra medullary rod traverses the old healed fracture within the distal femur. No evidence of acute osseous fracture or displacement. No evidence of hardware loosening or other surgical complicating feature. No appreciable joint effusion and adjacent soft tissues are unremarkable. IMPRESSION: 1. No acute findings. No evidence of acute osseous fracture or dislocation. 2. Old fracture of the distal right femur status post intramedullary rod fixation. Also status post right knee arthroplasty. Hardware appears intact and appropriately positioned. Electronically Signed   By: Franki Cabot M.D.   On: 03/17/2016 19:36   Dg Humerus Right  Result Date: 03/17/2016 CLINICAL DATA:  Mechanical fall, upper arm pain. EXAM: RIGHT HUMERUS - 2+ VIEW COMPARISON:  None. FINDINGS: There is no evidence of fracture or other focal bone lesions. Soft tissues are unremarkable. IMPRESSION: Negative. Electronically Signed   By: Franki Cabot M.D.   On: 03/17/2016 19:34   Dg Femur 1v Right  Result Date: 03/17/2016 CLINICAL DATA:  Status post mechanical fall, lower extremity pain. EXAM: RIGHT FEMUR 1 VIEW COMPARISON:  None. FINDINGS: Old healed fracture noted within the distal right femur status  post previous intra medullary rod fixation. Patient also status post right knee arthroplasty. Hardware appears intact and appropriately positioned. No evidence of acute osseous fracture or dislocation. Right femoral head appears appropriately positioned relative to the acetabulum. Adjacent soft tissues are unremarkable. IMPRESSION: 1. No acute osseous fracture or dislocation. 2. Old healed fracture of the distal right femur status post intramedullary rod fixation. Also status post right knee arthroplasty. Hardware appears intact and appropriately positioned. Electronically Signed   By: Franki Cabot M.D.   On: 03/17/2016  19:38    Procedures Procedures (including critical care time)  Medications Ordered in ED Medications - No data to display   Initial Impression / Assessment and Plan / ED Course  I have reviewed the triage vital signs and the nursing notes.  Pertinent imaging results that were available during my care of the patient were reviewed by me and considered in my medical decision making (see chart for details).  Clinical Course    Pt w/ mechanical fall, no LOC. Awake and alert, no distress on exam. Vital signs stable. Neurologic exam normal. Obtained CT of head and C-spine as well as plain films of right arm and knee.  Plain films negative for acute injury and CT head and C-spine negative acute. Patient well-appearing on reexamination. Discussed supportive care for contusions and provided with small amount of pain medication to use at home, cautioned on use. Patient voiced understanding and was discharged in satisfactory condition.  Final Clinical Impressions(s) / ED Diagnoses   Final diagnoses:  Fall, initial encounter  Contusion of right knee, initial encounter    New Prescriptions New Prescriptions   HYDROCODONE-ACETAMINOPHEN (NORCO/VICODIN) 5-325 MG TABLET    Take 1 tablet by mouth every 6 (six) hours as needed for severe pain.     Sharlett Iles, MD 03/17/16  [phone]

## 2016-03-24 DIAGNOSIS — H43813 Vitreous degeneration, bilateral: Secondary | ICD-10-CM | POA: Diagnosis not present

## 2016-03-24 DIAGNOSIS — Z961 Presence of intraocular lens: Secondary | ICD-10-CM | POA: Diagnosis not present

## 2016-03-24 DIAGNOSIS — H52203 Unspecified astigmatism, bilateral: Secondary | ICD-10-CM | POA: Diagnosis not present

## 2016-03-24 DIAGNOSIS — H01001 Unspecified blepharitis right upper eyelid: Secondary | ICD-10-CM | POA: Diagnosis not present

## 2016-03-29 DIAGNOSIS — L814 Other melanin hyperpigmentation: Secondary | ICD-10-CM | POA: Diagnosis not present

## 2016-03-29 DIAGNOSIS — Z85828 Personal history of other malignant neoplasm of skin: Secondary | ICD-10-CM | POA: Diagnosis not present

## 2016-03-29 DIAGNOSIS — L821 Other seborrheic keratosis: Secondary | ICD-10-CM | POA: Diagnosis not present

## 2016-03-30 DIAGNOSIS — Z23 Encounter for immunization: Secondary | ICD-10-CM | POA: Diagnosis not present

## 2016-04-04 ENCOUNTER — Other Ambulatory Visit: Payer: Self-pay | Admitting: Nurse Practitioner

## 2016-04-04 DIAGNOSIS — I10 Essential (primary) hypertension: Secondary | ICD-10-CM

## 2016-04-06 ENCOUNTER — Non-Acute Institutional Stay: Payer: Medicare Other | Admitting: Nurse Practitioner

## 2016-04-06 ENCOUNTER — Encounter: Payer: Self-pay | Admitting: Nurse Practitioner

## 2016-04-06 DIAGNOSIS — F329 Major depressive disorder, single episode, unspecified: Secondary | ICD-10-CM

## 2016-04-06 DIAGNOSIS — E039 Hypothyroidism, unspecified: Secondary | ICD-10-CM

## 2016-04-06 DIAGNOSIS — E876 Hypokalemia: Secondary | ICD-10-CM | POA: Diagnosis not present

## 2016-04-06 DIAGNOSIS — K219 Gastro-esophageal reflux disease without esophagitis: Secondary | ICD-10-CM

## 2016-04-06 DIAGNOSIS — R200 Anesthesia of skin: Secondary | ICD-10-CM

## 2016-04-06 DIAGNOSIS — F418 Other specified anxiety disorders: Secondary | ICD-10-CM

## 2016-04-06 DIAGNOSIS — S2001XD Contusion of right breast, subsequent encounter: Secondary | ICD-10-CM | POA: Diagnosis not present

## 2016-04-06 DIAGNOSIS — S8001XD Contusion of right knee, subsequent encounter: Secondary | ICD-10-CM | POA: Diagnosis not present

## 2016-04-06 DIAGNOSIS — F419 Anxiety disorder, unspecified: Secondary | ICD-10-CM

## 2016-04-06 DIAGNOSIS — I890 Lymphedema, not elsewhere classified: Secondary | ICD-10-CM

## 2016-04-06 DIAGNOSIS — S8001XA Contusion of right knee, initial encounter: Secondary | ICD-10-CM | POA: Insufficient documentation

## 2016-04-06 DIAGNOSIS — S2001XA Contusion of right breast, initial encounter: Secondary | ICD-10-CM | POA: Insufficient documentation

## 2016-04-06 DIAGNOSIS — H578 Other specified disorders of eye and adnexa: Secondary | ICD-10-CM

## 2016-04-06 DIAGNOSIS — R202 Paresthesia of skin: Secondary | ICD-10-CM | POA: Diagnosis not present

## 2016-04-06 DIAGNOSIS — H5789 Other specified disorders of eye and adnexa: Secondary | ICD-10-CM

## 2016-04-06 DIAGNOSIS — I10 Essential (primary) hypertension: Secondary | ICD-10-CM

## 2016-04-06 NOTE — Assessment & Plan Note (Addendum)
Sustained from the fall 2 weeks ago. Warm compress and Biofreeze, may consider Mammogram if lump persists.

## 2016-04-06 NOTE — Assessment & Plan Note (Signed)
No itching or drainage, or vision changes, the patient thinks the CPAP machined at night irritated the eye. Will recommend artifical eye drops and observe.

## 2016-04-06 NOTE — Assessment & Plan Note (Signed)
From the fall 2 weeks ago, ED eval, X-ray R humerus and R knee, pain is better, recommend Biofreeze qid

## 2016-04-06 NOTE — Progress Notes (Signed)
Patient ID: Tamara Morales, female   DOB: May 06, 1931, 82 y.o.   MRN: [id number]   Location:   clinic [facility]   Place of Service:   clinic [place]  Provider: Marlana Latus NP  Code Status: DNR  Goals of Care: [place]  Advanced Directives 04/06/2016  Does patient have an advance directive? Yes  Type of Advance Directive [place]  Does patient want to make changes to advanced directive? No - Patient declined  Copy of advanced directive(s) in chart? Yes  Would patient like information on creating an advanced directive? -  Pre-existing out of facility DNR order (yellow form or pink MOST form) -     No chief complaint on file.   HPI: Patient is a 82 y.o. female seen today for medical management of chronic diseases.    Hx of kidney stone, off Flomax. Takes HCTZ and Torsemide  for chronic BLE edema, taking Kcl 56meq daily, 02/29/16 Na 141, K 3.7, Bun 22, creat 0.9, HTN controlled on Amlodipine/Valsarta 5/160mg  daily. Depression/anxiety, mood is stable, sleeps well while on Alprazolam prn bid. Hypothyroidism is well supplemented while on Synthroid 139mcg since 03/06/16 TSH 0.04 02/29/16.  Asymptomatic GERD while on Omeprazole 20mg  bid   Past Medical History:  Diagnosis Date  . ARTHRITIS, KNEES, BILATERAL   . Breast CA ([place]) 2000   s/p R lumpectomy and XRT  . DEPRESSION   . DJD (degenerative joint disease) of knee   . GERD   . HYPERLIPIDEMIA   . HYPERTENSION   . HYPOTHYROIDISM    postsurgical  . Hypoxia   . MIGRAINE HEADACHE   . Morbid obesity ([place])   . OSA (obstructive sleep apnea) 01/17/2011 dx  . Oxygen dependent   . Urge incontinence   . UTI (lower urinary tract infection) 12/2014    Past Surgical History:  Procedure Laterality Date  . ABDOMINAL HYSTERECTOMY    . ADENOIDECTOMY    . BREAST BIOPSY  2000  . BREAST LUMPECTOMY  2000  . CATARACT EXTRACTION    . CHOLECYSTECTOMY  1995  . CYSTOSCOPY/URETEROSCOPY/HOLMIUM LASER Right 06/28/2015   Procedure: CYSTOSCOPY RIGHT  RETROGRAD RIGHT URETEROSCOPY/HOLMIUM LASER WITH RIGHT STENT PLACEMENT;  Surgeon: Alexis Frock, MD;  Location: [facility];  Service: Urology;  Laterality: Right;  . PARATHYROIDECTOMY     2-3 removed  . REPLACEMENT TOTAL KNEE BILATERAL  1999, 2005  . Right leg femur repaired    . THYROIDECTOMY    . TONSILLECTOMY    . TUBAL LIGATION      Allergies  Allergen Reactions  . Azithromycin Itching  . Beta Adrenergic Blockers     Depression   . Ciprofloxacin Other (See Comments)    REACTION: edgy and very jumpy   . Codeine Nausea And Vomiting  . Epinephrine Other (See Comments)    REACTION: rapid pulse, sweats  . Klonopin [Clonazepam] Other (See Comments)    Makes her feel very suicidal   . Oxycodone Other (See Comments)    Patient felt like it altered her mental status "Crazy" . Hallucinations later as well.  [name] Paxil [Paroxetine Hydrochloride] Other (See Comments)    Sever depression   . Prednisone     "makes me feel really bad"  . Propoxyphene Hcl Nausea And Vomiting  . Tramadol Other (See Comments)    Feels "jittery"  . Meloxicam Diarrhea  . Vancomycin Rash    Localized rash related to infusion rate.      Medication List       Accurate  as of 04/06/16  2:34 PM. Always use your most recent med list.          acetaminophen 325 MG tablet Commonly known as:  TYLENOL Take 650 mg by mouth daily.   ALPRAZolam 0.25 MG tablet Commonly known as:  XANAX Take one tablet by mouth twice daily as needed for anxiety   amLODipine-valsartan 5-160 MG tablet Commonly known as:  EXFORGE TAKE 1 TABLET ONCE DAILY.   aspirin 81 MG tablet Take 81 mg by mouth daily.   cetirizine 10 MG tablet Commonly known as:  ZYRTEC Take 10 mg by mouth daily. Reported on 11/27/2015   citalopram 10 MG tablet Commonly known as:  CELEXA Take one tablet by mouth once daily for depression   clotrimazole 1 % cream Commonly known as:  LOTRIMIN Apply 1 application topically 2 (two) times daily. As needed  for fungal skin infections   diphenoxylate-atropine 2.5-0.025 MG tablet Commonly known as:  LOMOTIL Take 1 tablet by mouth 4 (four) times daily as needed for diarrhea or loose stools.   diphenoxylate-atropine 2.5-0.025 MG tablet Commonly known as:  LOMOTIL Take 1 tablet by mouth 4 (four) times daily as needed for diarrhea or loose stools.   FIBER PO Take 2 tablets by mouth every morning.   Fish Oil 1200 MG Caps Take 2,400 mg by mouth daily.   fluticasone 50 MCG/ACT nasal spray Commonly known as:  FLONASE Place 2 sprays into both nostrils daily.   hydrochlorothiazide 12.5 MG tablet Commonly known as:  HYDRODIURIL Take 1 tablet (12.5 mg total) by mouth daily.   HYDROcodone-acetaminophen 5-325 MG tablet Commonly known as:  NORCO/VICODIN Take 1 tablet by mouth every 6 (six) hours as needed for severe pain.   isometheptene-acetaminophen-dichloralphenazone 65-100-325 MG capsule Commonly known as:  MIDRIN Take 1 capsule by mouth every 4 (four) hours as needed for migraine.   levothyroxine 150 MCG tablet Commonly known as:  SYNTHROID, LEVOTHROID Take 1 tablet (150 mcg total) by mouth daily.   nystatin powder Generic drug:  nystatin APPLY TO AFFECTED AREA TWICE A DAY   omeprazole 20 MG capsule Commonly known as:  PRILOSEC Take 1 capsule (20 mg total) by mouth daily.   potassium chloride 10 MEQ CR capsule Commonly known as:  MICRO-K TAKE 4 CAPSULES DAILY FOR POTASSIUM   torsemide 20 MG tablet Commonly known as:  DEMADEX Take 1 tablet (20 mg total) by mouth daily.       Review of Systems:  Review of Systems  Constitutional:       Obese  HENT: Negative for congestion, ear pain, hearing loss, sore throat and tinnitus.   Eyes: Positive for redness. Negative for photophobia and pain.       Left eye  Respiratory: Negative for cough and shortness of breath.   Cardiovascular: Positive for leg swelling. Negative for chest pain and palpitations.       Chronic BLE,  dermatosclerosis changes  Gastrointestinal: Negative for abdominal pain, blood in stool, constipation, diarrhea, nausea and vomiting.  Genitourinary: Positive for frequency. Negative for dysuria, flank pain, hematuria and urgency.       The right   Musculoskeletal: Positive for back pain. Negative for myalgias.  Skin: Negative for rash.       Contusion right breast, tender lump palpated, s/p partial mastectomy. Contusion right knee.   Neurological: Negative for dizziness, weakness and headaches.  Psychiatric/Behavioral: The patient is nervous/anxious.     Health Maintenance  Topic Date Due  . DEXA SCAN  09/07/1997  .  INFLUENZA VACCINE  01/18/2016  . TETANUS/TDAP  10/05/2019  . ZOSTAVAX  Completed  . PNA vac Low Risk Adult  Completed    Physical Exam: Vitals:   04/06/16 1352  BP: 120/78  Pulse: (!) 56  Resp: 20  Temp: 98.4 F (36.9 C)  Weight: (!) 301 lb (136.5 kg)   Body mass index is 48.58 kg/m. Physical Exam  Constitutional: She is oriented to person, place, and time. She appears well-developed and well-nourished. She is active and cooperative. No distress.  BP 144/81 mmHg  Pulse 87  Temp(Src) 98 F (36.7 C) (Oral)  Resp 17  SpO2 95%  HENT:  Head: Normocephalic and atraumatic.  Right Ear: Hearing and external ear normal.  Left Ear: Hearing and external ear normal.  Nose: Nose normal.  Mouth/Throat: Oropharynx is clear and moist.  Eyes: Conjunctivae are normal. No scleral icterus.  Left eye redness, no itching.   Neck: Normal range of motion. Neck supple. No thyromegaly present.  Cardiovascular: Normal rate, regular rhythm, normal heart sounds and intact distal pulses.   Pulses:      Radial pulses are 2+ on the right side, and 2+ on the left side.       Dorsalis pedis pulses are 1+ on the right side, and 1+ on the left side.  Pulmonary/Chest: Effort normal and breath sounds normal.  Abdominal: Soft. There is no tenderness.  Musculoskeletal: She exhibits edema.         Lumbar back: She exhibits decreased range of motion and pain. She exhibits no tenderness, no bony tenderness, no swelling and no edema.       Back:  Electric w/c for mobility Chronic BLE dermatosclerosis, trace edema   Lymphadenopathy:       Head (right side): No tonsillar, no preauricular, no posterior auricular and no occipital adenopathy present.       Head (left side): No tonsillar, no preauricular, no posterior auricular and no occipital adenopathy present.    She has no cervical adenopathy.       Right: No supraclavicular adenopathy present.       Left: No supraclavicular adenopathy present.  Both lower legs with substantial swelling and skin changes consistent with the chronic swelling.  Neurological: She is alert and oriented to person, place, and time. No sensory deficit.  Skin: Skin is warm, dry and intact. No cyanosis or erythema. Nails show no clubbing.  Contusion right breast, tender lump palpated, s/p partial mastectomy. Contusion right knee, 3x3 erythema and bruised area noted.    Psychiatric: She has a normal mood and affect. Her speech is normal and behavior is normal.    Labs reviewed: Basic Metabolic Panel:  Recent Labs  06/23/15 1918  06/29/15 0430 06/30/15 0435  07/26/15 0751 08/31/15 02/29/16  NA  --   < > 140 140  < > 140 141 141  K  --   < > 3.9 4.2  < > 3.6 3.9 3.7  CL  --   < > 106 105  --  101  --   --   CO2  --   < > 27 29  --  27  --   --   GLUCOSE  --   < > 163* 113*  --  127*  --   --   BUN  --   < > 22* 16  < > 15 17 22*  CREATININE  --   < > 1.12* 1.00  < > 0.80 0.6 0.9  CALCIUM  --   < >  7.4* 7.6*  --  8.3*  --   --   TSH 0.768  --   --   --   --   --  0.03* 0.04*  < > = values in this interval not displayed. Liver Function Tests:  Recent Labs  06/28/15 0446 06/29/15 0430 06/30/15 0435 07/06/15 02/29/16  AST 40 33 49* 12* 13  ALT 33 28 32 15 11  ALKPHOS 90 79 88 76 80  BILITOT 0.7 0.6 0.6  --   --   PROT 6.3* 5.7* 6.1*  --   --    ALBUMIN 3.1* 2.8* 2.8*  --   --     Recent Labs  06/27/15 1215  LIPASE 19    Recent Labs  06/30/15 1115  AMMONIA 25   CBC:  Recent Labs  06/29/15 0430 06/30/15 0435 07/06/15 07/26/15 0751 02/29/16  WBC 8.7 7.3 8.5 6.3 5.6  NEUTROABS 7.0 5.4  --  4.5  --   HGB 10.9* 11.4* 12.0 12.0 12.4  HCT 35.5* 37.1 35* 36.3 37  MCV 93.4 93.7  --  88.3  --   PLT 135* 137* 219 145* 170   Lipid Panel: No results for input(s): CHOL, HDL, LDLCALC, TRIG, CHOLHDL, LDLDIRECT in the last 8760 hours. Lab Results  Component Value Date   HGBA1C 6.2 06/23/2015    Procedures since last visit: Ct Head Wo Contrast  Result Date: 03/17/2016 CLINICAL DATA:  Status post mechanical fall, hit head, upper arm pain. EXAM: CT HEAD WITHOUT CONTRAST CT CERVICAL SPINE WITHOUT CONTRAST TECHNIQUE: Multidetector CT imaging of the head and cervical spine was performed following the standard protocol without intravenous contrast. Multiplanar CT image reconstructions of the cervical spine were also generated. COMPARISON:  Head CT dated 11/09/2014. FINDINGS: CT HEAD FINDINGS Brain: Again noted is mild generalized age related parenchymal atrophy with commensurate dilatation of the ventricles and sulci. Mild chronic small vessel ischemic change again noted within the periventricular white matter bilaterally. There is no mass, hemorrhage, edema or other evidence of acute parenchymal abnormality. No extra-axial hemorrhage. Vascular: There are chronic calcified atherosclerotic changes of the large vessels at the skull base. No unexpected hyperdense vessel. Skull: No osseous fracture or dislocation seen. Sinuses/Orbits: No acute finding. Other: None CT CERVICAL SPINE FINDINGS Alignment: Slight retrolistheses of C3 through C6 are likely related to the underlying degenerative changes at these levels. Also mild scoliosis. No evidence of acute subluxation. Skull base and vertebrae: No fracture line or displaced fracture fragment  seen. Soft tissues and spinal canal: No prevertebral fluid or swelling. No visible canal hematoma. Atherosclerotic calcifications noted at each carotid bulb region. Disc levels: Degenerative changes at the C4-5 through C7-T1 levels, moderate in degree with associated disc space narrowings and osseous spurring. Associated disc-osteophytic bulges at each level causing minimal to mild central canal stenoses. Upper chest: Unremarkable. Other: None IMPRESSION: 1. No acute intracranial abnormality. No intracranial mass, hemorrhage or edema. No skull fracture. Atrophy and chronic ischemic changes in the white matter, stable. 2. No fracture or acute subluxation in the cervical spine. Degenerative changes, as detailed above. Electronically Signed   By: Franki Cabot M.D.   On: 03/17/2016 19:45   Ct Cervical Spine Wo Contrast  Result Date: 03/17/2016 CLINICAL DATA:  Status post mechanical fall, hit head, upper arm pain. EXAM: CT HEAD WITHOUT CONTRAST CT CERVICAL SPINE WITHOUT CONTRAST TECHNIQUE: Multidetector CT imaging of the head and cervical spine was performed following the standard protocol without intravenous contrast. Multiplanar CT image reconstructions  of the cervical spine were also generated. COMPARISON:  Head CT dated 11/09/2014. FINDINGS: CT HEAD FINDINGS Brain: Again noted is mild generalized age related parenchymal atrophy with commensurate dilatation of the ventricles and sulci. Mild chronic small vessel ischemic change again noted within the periventricular white matter bilaterally. There is no mass, hemorrhage, edema or other evidence of acute parenchymal abnormality. No extra-axial hemorrhage. Vascular: There are chronic calcified atherosclerotic changes of the large vessels at the skull base. No unexpected hyperdense vessel. Skull: No osseous fracture or dislocation seen. Sinuses/Orbits: No acute finding. Other: None CT CERVICAL SPINE FINDINGS Alignment: Slight retrolistheses of C3 through C6 are  likely related to the underlying degenerative changes at these levels. Also mild scoliosis. No evidence of acute subluxation. Skull base and vertebrae: No fracture line or displaced fracture fragment seen. Soft tissues and spinal canal: No prevertebral fluid or swelling. No visible canal hematoma. Atherosclerotic calcifications noted at each carotid bulb region. Disc levels: Degenerative changes at the C4-5 through C7-T1 levels, moderate in degree with associated disc space narrowings and osseous spurring. Associated disc-osteophytic bulges at each level causing minimal to mild central canal stenoses. Upper chest: Unremarkable. Other: None IMPRESSION: 1. No acute intracranial abnormality. No intracranial mass, hemorrhage or edema. No skull fracture. Atrophy and chronic ischemic changes in the white matter, stable. 2. No fracture or acute subluxation in the cervical spine. Degenerative changes, as detailed above. Electronically Signed   By: Bary Richard M.D.   On: 03/17/2016 19:45   Dg Knee Complete 4 Views Right  Result Date: 03/17/2016 CLINICAL DATA:  Mechanical fall, knee pain. EXAM: RIGHT KNEE - COMPLETE 4+ VIEW COMPARISON:  None. FINDINGS: Patient is status post intra medullary rod fixation of a distal femur fracture. Patient is also status post right knee arthroplasty. Hardware appears intact and appropriately positioned. Intra medullary rod traverses the old healed fracture within the distal femur. No evidence of acute osseous fracture or displacement. No evidence of hardware loosening or other surgical complicating feature. No appreciable joint effusion and adjacent soft tissues are unremarkable. IMPRESSION: 1. No acute findings. No evidence of acute osseous fracture or dislocation. 2. Old fracture of the distal right femur status post intramedullary rod fixation. Also status post right knee arthroplasty. Hardware appears intact and appropriately positioned. Electronically Signed   By: Bary Richard M.D.    On: 03/17/2016 19:36   Dg Humerus Right  Result Date: 03/17/2016 CLINICAL DATA:  Mechanical fall, upper arm pain. EXAM: RIGHT HUMERUS - 2+ VIEW COMPARISON:  None. FINDINGS: There is no evidence of fracture or other focal bone lesions. Soft tissues are unremarkable. IMPRESSION: Negative. Electronically Signed   By: Bary Richard M.D.   On: 03/17/2016 19:34   Dg Femur 1v Right  Result Date: 03/17/2016 CLINICAL DATA:  Status post mechanical fall, lower extremity pain. EXAM: RIGHT FEMUR 1 VIEW COMPARISON:  None. FINDINGS: Old healed fracture noted within the distal right femur status post previous intra medullary rod fixation. Patient also status post right knee arthroplasty. Hardware appears intact and appropriately positioned. No evidence of acute osseous fracture or dislocation. Right femoral head appears appropriately positioned relative to the acetabulum. Adjacent soft tissues are unremarkable. IMPRESSION: 1. No acute osseous fracture or dislocation. 2. Old healed fracture of the distal right femur status post intramedullary rod fixation. Also status post right knee arthroplasty. Hardware appears intact and appropriately positioned. Electronically Signed   By: Bary Richard M.D.   On: 03/17/2016 19:38    Assessment/Plan  Essential hypertension Controlled,  continue Amlodipine Valsartan 5/160mg  daily. HCTZ 12.5mg  daily. Torsemide 20mg . 02/29/16 Na 141, K 3.7, Bun 22, creat 0.86, Hgb 12.4  Acid reflux Stable, continue Omeprazole   Hypothyroidism Decrease Levothyroxine to 152mcg since 03/06/16, TSH 0.04 02/29/16, repeat TSH in 12 weeks   Anxiety and depression Mood is stable, Alprazolam 0.125mg  bid prn. Observe the patient.    Hypokalemia Normalized, last K 3.7 02/29/16  Lymphedema of both lower extremities Chronic 2+, Continue HCTZ, Torsemide.   Numbness and tingling of right leg Onset 04/05/18, resolved 04/06/16, X-ray lumbar spine if no better  Contusion of right knee From the fall  2 weeks ago, ED eval, X-ray R humerus and R knee, pain is better, recommend Biofreeze qid  Contusion of right breast Sustained from the fall 2 weeks ago. Warm compress and Biofreeze, may consider Mammogram if lump persists.  Redness of left eye No itching or drainage, or vision changes, the patient thinks the CPAP machined at night irritated the eye. Will recommend artifical eye drops and observe.    Labs/tests ordered: X-ray lumbar spine if no better  Next appt:  4 months

## 2016-04-06 NOTE — Assessment & Plan Note (Addendum)
Controlled, continue Amlodipine Valsartan 5/160mg  daily. HCTZ 12.5mg  daily. Torsemide 20mg . 02/29/16 Na 141, K 3.7, Bun 22, creat 0.86, Hgb 12.4

## 2016-04-06 NOTE — Assessment & Plan Note (Signed)
Decrease Levothyroxine to 187mcg since 03/06/16, TSH 0.04 02/29/16, repeat TSH in 12 weeks

## 2016-04-06 NOTE — Assessment & Plan Note (Signed)
Onset 04/05/18, resolved 04/06/16, X-ray lumbar spine if no better

## 2016-04-06 NOTE — Assessment & Plan Note (Signed)
Stable, continue Omeprazole.  

## 2016-04-06 NOTE — Assessment & Plan Note (Signed)
Chronic 2+, Continue HCTZ, Torsemide.

## 2016-04-06 NOTE — Assessment & Plan Note (Signed)
Normalized, last K 3.7 02/29/16

## 2016-04-06 NOTE — Assessment & Plan Note (Signed)
Mood is stable, Alprazolam 0.125mg  bid prn. Observe the patient.

## 2016-04-23 ENCOUNTER — Encounter: Payer: Self-pay | Admitting: Nurse Practitioner

## 2016-04-25 DIAGNOSIS — R2681 Unsteadiness on feet: Secondary | ICD-10-CM | POA: Diagnosis not present

## 2016-04-25 DIAGNOSIS — M545 Low back pain: Secondary | ICD-10-CM | POA: Diagnosis not present

## 2016-04-25 DIAGNOSIS — M6281 Muscle weakness (generalized): Secondary | ICD-10-CM | POA: Diagnosis not present

## 2016-04-25 DIAGNOSIS — M25552 Pain in left hip: Secondary | ICD-10-CM | POA: Diagnosis not present

## 2016-04-26 ENCOUNTER — Other Ambulatory Visit: Payer: Self-pay | Admitting: *Deleted

## 2016-04-26 DIAGNOSIS — Z853 Personal history of malignant neoplasm of breast: Secondary | ICD-10-CM

## 2016-04-26 DIAGNOSIS — N63 Unspecified lump in unspecified breast: Secondary | ICD-10-CM

## 2016-04-28 ENCOUNTER — Other Ambulatory Visit: Payer: Self-pay

## 2016-04-28 ENCOUNTER — Ambulatory Visit
Admission: RE | Admit: 2016-04-28 | Discharge: 2016-04-28 | Disposition: A | Discharge status: Home / Self Care | Payer: Medicare Other | Source: Ambulatory Visit | Attending: Nurse Practitioner | Admitting: Nurse Practitioner

## 2016-04-28 ENCOUNTER — Telehealth: Payer: Self-pay | Admitting: *Deleted

## 2016-04-28 DIAGNOSIS — M5136 Other intervertebral disc degeneration, lumbar region: Secondary | ICD-10-CM | POA: Diagnosis not present

## 2016-04-28 DIAGNOSIS — Z1239 Encounter for other screening for malignant neoplasm of breast: Secondary | ICD-10-CM

## 2016-04-28 DIAGNOSIS — R200 Anesthesia of skin: Secondary | ICD-10-CM

## 2016-04-28 DIAGNOSIS — R202 Paresthesia of skin: Secondary | ICD-10-CM

## 2016-04-28 NOTE — Telephone Encounter (Signed)
Jennifer with [facility] called and stated that patient's Mammogram orders needed to be updated to a 3D Mammogram ([id number]) and Ultrasound ([id number]). Order placed.

## 2016-05-03 ENCOUNTER — Other Ambulatory Visit: Payer: Self-pay | Admitting: *Deleted

## 2016-05-03 ENCOUNTER — Other Ambulatory Visit: Payer: Self-pay

## 2016-05-03 DIAGNOSIS — S2001XA Contusion of right breast, initial encounter: Secondary | ICD-10-CM

## 2016-05-03 DIAGNOSIS — N6310 Unspecified lump in the right breast, unspecified quadrant: Secondary | ICD-10-CM

## 2016-05-03 DIAGNOSIS — Z853 Personal history of malignant neoplasm of breast: Secondary | ICD-10-CM

## 2016-05-08 DIAGNOSIS — R2681 Unsteadiness on feet: Secondary | ICD-10-CM | POA: Diagnosis not present

## 2016-05-08 DIAGNOSIS — M545 Low back pain: Secondary | ICD-10-CM | POA: Diagnosis not present

## 2016-05-08 DIAGNOSIS — M25552 Pain in left hip: Secondary | ICD-10-CM | POA: Diagnosis not present

## 2016-05-08 DIAGNOSIS — M6281 Muscle weakness (generalized): Secondary | ICD-10-CM | POA: Diagnosis not present

## 2016-05-10 DIAGNOSIS — R2681 Unsteadiness on feet: Secondary | ICD-10-CM | POA: Diagnosis not present

## 2016-05-10 DIAGNOSIS — M25552 Pain in left hip: Secondary | ICD-10-CM | POA: Diagnosis not present

## 2016-05-10 DIAGNOSIS — M545 Low back pain: Secondary | ICD-10-CM | POA: Diagnosis not present

## 2016-05-10 DIAGNOSIS — M6281 Muscle weakness (generalized): Secondary | ICD-10-CM | POA: Diagnosis not present

## 2016-05-12 DIAGNOSIS — R2681 Unsteadiness on feet: Secondary | ICD-10-CM | POA: Diagnosis not present

## 2016-05-12 DIAGNOSIS — M545 Low back pain: Secondary | ICD-10-CM | POA: Diagnosis not present

## 2016-05-12 DIAGNOSIS — M6281 Muscle weakness (generalized): Secondary | ICD-10-CM | POA: Diagnosis not present

## 2016-05-12 DIAGNOSIS — M25552 Pain in left hip: Secondary | ICD-10-CM | POA: Diagnosis not present

## 2016-05-15 ENCOUNTER — Ambulatory Visit
Admission: RE | Admit: 2016-05-15 | Discharge: 2016-05-15 | Disposition: A | Discharge status: Home / Self Care | Payer: Medicare Other | Source: Ambulatory Visit | Attending: Nurse Practitioner | Admitting: Nurse Practitioner

## 2016-05-15 ENCOUNTER — Other Ambulatory Visit: Payer: Self-pay | Admitting: Nurse Practitioner

## 2016-05-15 DIAGNOSIS — Z853 Personal history of malignant neoplasm of breast: Secondary | ICD-10-CM

## 2016-05-15 DIAGNOSIS — R928 Other abnormal and inconclusive findings on diagnostic imaging of breast: Secondary | ICD-10-CM | POA: Diagnosis not present

## 2016-05-15 DIAGNOSIS — M25552 Pain in left hip: Secondary | ICD-10-CM | POA: Diagnosis not present

## 2016-05-15 DIAGNOSIS — N6011 Diffuse cystic mastopathy of right breast: Secondary | ICD-10-CM | POA: Diagnosis not present

## 2016-05-15 DIAGNOSIS — S2001XA Contusion of right breast, initial encounter: Secondary | ICD-10-CM

## 2016-05-15 DIAGNOSIS — N6310 Unspecified lump in the right breast, unspecified quadrant: Secondary | ICD-10-CM

## 2016-05-15 DIAGNOSIS — M6281 Muscle weakness (generalized): Secondary | ICD-10-CM | POA: Diagnosis not present

## 2016-05-15 DIAGNOSIS — N6489 Other specified disorders of breast: Secondary | ICD-10-CM

## 2016-05-15 DIAGNOSIS — N6012 Diffuse cystic mastopathy of left breast: Secondary | ICD-10-CM | POA: Diagnosis not present

## 2016-05-15 DIAGNOSIS — R2681 Unsteadiness on feet: Secondary | ICD-10-CM | POA: Diagnosis not present

## 2016-05-15 DIAGNOSIS — M545 Low back pain: Secondary | ICD-10-CM | POA: Diagnosis not present

## 2016-05-17 ENCOUNTER — Encounter: Payer: Self-pay | Admitting: Nurse Practitioner

## 2016-05-17 DIAGNOSIS — N641 Fat necrosis of breast: Secondary | ICD-10-CM | POA: Insufficient documentation

## 2016-05-22 ENCOUNTER — Other Ambulatory Visit: Payer: Self-pay | Admitting: Nurse Practitioner

## 2016-05-22 DIAGNOSIS — M25552 Pain in left hip: Secondary | ICD-10-CM | POA: Diagnosis not present

## 2016-05-22 DIAGNOSIS — M6281 Muscle weakness (generalized): Secondary | ICD-10-CM | POA: Diagnosis not present

## 2016-05-22 DIAGNOSIS — R2681 Unsteadiness on feet: Secondary | ICD-10-CM | POA: Diagnosis not present

## 2016-05-22 DIAGNOSIS — M545 Low back pain: Secondary | ICD-10-CM | POA: Diagnosis not present

## 2016-05-24 DIAGNOSIS — R2681 Unsteadiness on feet: Secondary | ICD-10-CM | POA: Diagnosis not present

## 2016-05-24 DIAGNOSIS — M6281 Muscle weakness (generalized): Secondary | ICD-10-CM | POA: Diagnosis not present

## 2016-05-24 DIAGNOSIS — M25552 Pain in left hip: Secondary | ICD-10-CM | POA: Diagnosis not present

## 2016-05-24 DIAGNOSIS — M545 Low back pain: Secondary | ICD-10-CM | POA: Diagnosis not present

## 2016-05-26 DIAGNOSIS — R2681 Unsteadiness on feet: Secondary | ICD-10-CM | POA: Diagnosis not present

## 2016-05-26 DIAGNOSIS — M545 Low back pain: Secondary | ICD-10-CM | POA: Diagnosis not present

## 2016-05-26 DIAGNOSIS — M6281 Muscle weakness (generalized): Secondary | ICD-10-CM | POA: Diagnosis not present

## 2016-05-26 DIAGNOSIS — M25552 Pain in left hip: Secondary | ICD-10-CM | POA: Diagnosis not present

## 2016-05-29 DIAGNOSIS — M545 Low back pain: Secondary | ICD-10-CM | POA: Diagnosis not present

## 2016-05-29 DIAGNOSIS — R2681 Unsteadiness on feet: Secondary | ICD-10-CM | POA: Diagnosis not present

## 2016-05-29 DIAGNOSIS — M6281 Muscle weakness (generalized): Secondary | ICD-10-CM | POA: Diagnosis not present

## 2016-05-29 DIAGNOSIS — M25552 Pain in left hip: Secondary | ICD-10-CM | POA: Diagnosis not present

## 2016-05-31 DIAGNOSIS — M545 Low back pain: Secondary | ICD-10-CM | POA: Diagnosis not present

## 2016-05-31 DIAGNOSIS — R2681 Unsteadiness on feet: Secondary | ICD-10-CM | POA: Diagnosis not present

## 2016-05-31 DIAGNOSIS — M25552 Pain in left hip: Secondary | ICD-10-CM | POA: Diagnosis not present

## 2016-05-31 DIAGNOSIS — M6281 Muscle weakness (generalized): Secondary | ICD-10-CM | POA: Diagnosis not present

## 2016-06-01 ENCOUNTER — Encounter: Payer: Self-pay | Admitting: Nurse Practitioner

## 2016-06-01 DIAGNOSIS — M6281 Muscle weakness (generalized): Secondary | ICD-10-CM | POA: Diagnosis not present

## 2016-06-01 DIAGNOSIS — M25552 Pain in left hip: Secondary | ICD-10-CM | POA: Diagnosis not present

## 2016-06-01 DIAGNOSIS — R2681 Unsteadiness on feet: Secondary | ICD-10-CM | POA: Diagnosis not present

## 2016-06-01 DIAGNOSIS — M545 Low back pain: Secondary | ICD-10-CM | POA: Diagnosis not present

## 2016-06-05 ENCOUNTER — Other Ambulatory Visit: Payer: Self-pay | Admitting: *Deleted

## 2016-06-05 ENCOUNTER — Other Ambulatory Visit: Payer: Self-pay | Admitting: Nurse Practitioner

## 2016-06-05 DIAGNOSIS — M25552 Pain in left hip: Secondary | ICD-10-CM | POA: Diagnosis not present

## 2016-06-05 DIAGNOSIS — M6281 Muscle weakness (generalized): Secondary | ICD-10-CM | POA: Diagnosis not present

## 2016-06-05 DIAGNOSIS — R2681 Unsteadiness on feet: Secondary | ICD-10-CM | POA: Diagnosis not present

## 2016-06-05 DIAGNOSIS — M545 Low back pain: Secondary | ICD-10-CM | POA: Diagnosis not present

## 2016-06-05 MED ORDER — ALPRAZOLAM 0.25 MG PO TABS: ORAL_TABLET | ORAL | 3 refills | Status: DC

## 2016-06-05 NOTE — Telephone Encounter (Signed)
[facility]  

## 2016-06-09 ENCOUNTER — Other Ambulatory Visit: Payer: Self-pay | Admitting: Nurse Practitioner

## 2016-06-09 DIAGNOSIS — I10 Essential (primary) hypertension: Secondary | ICD-10-CM

## 2016-06-20 ENCOUNTER — Other Ambulatory Visit: Payer: Self-pay | Admitting: Nurse Practitioner

## 2016-06-20 DIAGNOSIS — K219 Gastro-esophageal reflux disease without esophagitis: Secondary | ICD-10-CM

## 2016-06-20 DIAGNOSIS — E039 Hypothyroidism, unspecified: Secondary | ICD-10-CM | POA: Diagnosis not present

## 2016-06-20 LAB — TSH: TSH: 0.92 u[IU]/mL (ref 0.41–5.90)

## 2016-06-23 DIAGNOSIS — J069 Acute upper respiratory infection, unspecified: Secondary | ICD-10-CM | POA: Diagnosis not present

## 2016-06-29 ENCOUNTER — Encounter: Payer: Self-pay | Admitting: Nurse Practitioner

## 2016-06-29 ENCOUNTER — Non-Acute Institutional Stay: Payer: Medicare Other | Admitting: Nurse Practitioner

## 2016-06-29 DIAGNOSIS — E039 Hypothyroidism, unspecified: Secondary | ICD-10-CM

## 2016-06-29 DIAGNOSIS — I10 Essential (primary) hypertension: Secondary | ICD-10-CM | POA: Diagnosis not present

## 2016-06-29 DIAGNOSIS — J209 Acute bronchitis, unspecified: Secondary | ICD-10-CM

## 2016-06-29 DIAGNOSIS — F418 Other specified anxiety disorders: Secondary | ICD-10-CM | POA: Diagnosis not present

## 2016-06-29 DIAGNOSIS — K219 Gastro-esophageal reflux disease without esophagitis: Secondary | ICD-10-CM | POA: Diagnosis not present

## 2016-06-29 DIAGNOSIS — F329 Major depressive disorder, single episode, unspecified: Secondary | ICD-10-CM

## 2016-06-29 DIAGNOSIS — I890 Lymphedema, not elsewhere classified: Secondary | ICD-10-CM | POA: Diagnosis not present

## 2016-06-29 DIAGNOSIS — E876 Hypokalemia: Secondary | ICD-10-CM | POA: Diagnosis not present

## 2016-06-29 DIAGNOSIS — K589 Irritable bowel syndrome without diarrhea: Secondary | ICD-10-CM

## 2016-06-29 DIAGNOSIS — F419 Anxiety disorder, unspecified: Secondary | ICD-10-CM

## 2016-06-29 NOTE — Assessment & Plan Note (Signed)
continue Levothyroxine 174mcg since 03/06/16, TSH 0.04 02/29/16, 0.92 06/20/16. TSH in 4 months

## 2016-06-29 NOTE — Assessment & Plan Note (Signed)
Normalized, last K 3.7 02/29/16

## 2016-06-29 NOTE — Assessment & Plan Note (Signed)
Chronic 2+, Continue HCTZ, Torsemide.

## 2016-06-29 NOTE — Assessment & Plan Note (Signed)
Controlled, continue Amlodipine Valsartan 5/160mg  daily. HCTZ 12.5mg  daily. Torsemide 20mg . 02/29/16 Na 141, K 3.7, Bun 22, creat 0.86, Hgb 12.4

## 2016-06-29 NOTE — Assessment & Plan Note (Signed)
Stable, continue Omeprazole.  

## 2016-06-29 NOTE — Assessment & Plan Note (Signed)
Stable, continue prn Lomotil.

## 2016-06-29 NOTE — Assessment & Plan Note (Signed)
Persisted hacking cough and expiratory wheezes, will continue OTC Robitussin, Advair HHI. observe

## 2016-06-29 NOTE — Progress Notes (Signed)
Patient ID: Tamara Morales, female   DOB: 06-21-1930, 83 y.o.   MRN: [id number]   [facility] [place]   Place of Service:  [facility]  Provider: Marlana Latus NP  Code Status: DNR  Goals of Care: [place]  Advanced Directives 06/29/2016  Does Patient Have a Medical Advance Directive? Yes  Type of Paramedic of [place];Living will  Does patient want to make changes to medical advance directive? No - Patient declined  Copy of [place] in Chart? Yes  Would patient like information on creating a medical advance directive? -  Pre-existing out of facility DNR order (yellow form or pink MOST form) -     Chief Complaint  Patient presents with  . Medical Management of Chronic Issues    HPI: Patient is a 83 y.o. female seen today for medical management of chronic diseases.    None productive cough, afebrile, denied chest pain, no O2 desaturation, x1 week, better after complete Z-pk from Urgent Care. Noted cough and expiratory wheezes.   Hx of kidney stone, off Flomax. Takes HCTZ and Torsemide  for chronic BLE edema, taking Kcl 62meq daily, 02/29/16 Na 141, K 3.7, Bun 22, creat 0.9, HTN controlled on Amlodipine/Valsarta 5/160mg  daily. Depression/anxiety, mood is stable, sleeps well while on Alprazolam prn bid. Hypothyroidism is well supplemented while on Synthroid 139mcg since 03/06/16 TSH 0.04 02/29/16, 0.92 06/20/16.  Asymptomatic GERD while on Omeprazole 20mg  bid   Past Medical History:  Diagnosis Date  . ARTHRITIS, KNEES, BILATERAL   . Breast CA ([place]) 2000   s/p R lumpectomy and XRT  . DEPRESSION   . DJD (degenerative joint disease) of knee   . GERD   . HYPERLIPIDEMIA   . HYPERTENSION   . HYPOTHYROIDISM    postsurgical  . Hypoxia   . MIGRAINE HEADACHE   . Morbid obesity ([place])   . OSA (obstructive sleep apnea) 01/17/2011 dx  . Oxygen dependent   . Urge incontinence   . UTI (lower urinary tract infection) 12/2014    Past  Surgical History:  Procedure Laterality Date  . ABDOMINAL HYSTERECTOMY    . ADENOIDECTOMY    . BREAST BIOPSY  2000  . BREAST LUMPECTOMY  2000  . CATARACT EXTRACTION    . CHOLECYSTECTOMY  1995  . CYSTOSCOPY/URETEROSCOPY/HOLMIUM LASER Right 06/28/2015   Procedure: CYSTOSCOPY RIGHT RETROGRAD RIGHT URETEROSCOPY/HOLMIUM LASER WITH RIGHT STENT PLACEMENT;  Surgeon: Alexis Frock, MD;  Location: [facility];  Service: Urology;  Laterality: Right;  . PARATHYROIDECTOMY     2-3 removed  . REPLACEMENT TOTAL KNEE BILATERAL  1999, 2005  . Right leg femur repaired    . THYROIDECTOMY    . TONSILLECTOMY    . TUBAL LIGATION      Allergies  Allergen Reactions  . Azithromycin Itching  . Beta Adrenergic Blockers     Depression   . Ciprofloxacin Other (See Comments)    REACTION: edgy and very jumpy   . Codeine Nausea And Vomiting  . Epinephrine Other (See Comments)    REACTION: rapid pulse, sweats  . Klonopin [Clonazepam] Other (See Comments)    Makes her feel very suicidal   . Oxycodone Other (See Comments)    Patient felt like it altered her mental status "Crazy" . Hallucinations later as well.  [name] Paxil [Paroxetine Hydrochloride] Other (See Comments)    Sever depression   . Prednisone     "makes me feel really bad"  . Propoxyphene Hcl Nausea And  Vomiting  . Tramadol Other (See Comments)    Feels "jittery"  . Vicodin [Hydrocodone-Acetaminophen] Other (See Comments)    Hallucinations  . Meloxicam Diarrhea  . Vancomycin Rash    Localized rash related to infusion rate.    Allergies as of 06/29/2016      Reactions   Azithromycin Itching   Beta Adrenergic Blockers    Depression   Ciprofloxacin Other (See Comments)   REACTION: edgy and very jumpy    Codeine Nausea And Vomiting   Epinephrine Other (See Comments)   REACTION: rapid pulse, sweats   Klonopin [clonazepam] Other (See Comments)   Makes her feel very suicidal    Oxycodone Other (See Comments)   Patient felt like it altered her  mental status "Crazy" . Hallucinations later as well.   Paxil [paroxetine Hydrochloride] Other (See Comments)   Sever depression    Prednisone    "makes me feel really bad"   Propoxyphene Hcl Nausea And Vomiting   Tramadol Other (See Comments)   Feels "jittery"   Vicodin [hydrocodone-acetaminophen] Other (See Comments)   Hallucinations   Meloxicam Diarrhea   Vancomycin Rash   Localized rash related to infusion rate.      Medication List       Accurate as of 06/29/16 11:59 PM. Always use your most recent med list.          acetaminophen 325 MG tablet Commonly known as:  TYLENOL Take 650 mg by mouth daily.   ALPRAZolam 0.25 MG tablet Commonly known as:  XANAX Take 1/2 tablet by mouth twice daily as needed for anxiety   amLODipine-valsartan 5-160 MG tablet Commonly known as:  EXFORGE TAKE 1 TABLET ONCE DAILY.   aspirin 81 MG tablet Take 81 mg by mouth daily.   benzonatate 100 MG capsule Commonly known as:  TESSALON Take 2 capsules by mouth as needed.   cetirizine 10 MG tablet Commonly known as:  ZYRTEC Take 10 mg by mouth daily. Reported on 11/27/2015   citalopram 10 MG tablet Commonly known as:  CELEXA Take one tablet by mouth once daily for depression   clotrimazole 1 % cream Commonly known as:  LOTRIMIN Apply 1 application topically 2 (two) times daily. As needed for fungal skin infections   diphenoxylate-atropine 2.5-0.025 MG tablet Commonly known as:  LOMOTIL TAKE 1 TABLET FOUR TIMES DAILY AS NEEDED FOR DIARRHEA OR LOOSE STOOLS.   FIBER PO Take 2 tablets by mouth every morning.   Fish Oil 1200 MG Caps Take 2,400 mg by mouth daily.   fluticasone 50 MCG/ACT nasal spray Commonly known as:  FLONASE Place 2 sprays into both nostrils daily.   fluticasone-salmeterol 45-21 MCG/ACT inhaler Commonly known as:  ADVAIR HFA Inhale 1 puff into the lungs 2 (two) times daily.   guaiFENesin 600 MG 12 hr tablet Commonly known as:  MUCINEX Take 600 mg by mouth 2  (two) times daily.   hydrochlorothiazide 12.5 MG tablet Commonly known as:  HYDRODIURIL Take 1 tablet (12.5 mg total) by mouth daily.   isometheptene-acetaminophen-dichloralphenazone 65-100-325 MG capsule Commonly known as:  MIDRIN Take 1 capsule by mouth every 4 (four) hours as needed for migraine.   levothyroxine 150 MCG tablet Commonly known as:  SYNTHROID, LEVOTHROID Take 1 tablet (150 mcg total) by mouth daily.   nystatin powder Generic drug:  nystatin APPLY TO AFFECTED AREA TWICE A DAY   omeprazole 20 MG capsule Commonly known as:  PRILOSEC TAKE (1) CAPSULE DAILY.   potassium chloride 10 MEQ CR  capsule Commonly known as:  MICRO-K TAKE 4 CAPSULES DAILY FOR POTASSIUM   torsemide 20 MG tablet Commonly known as:  DEMADEX Take 1 tablet (20 mg total) by mouth daily.       Review of Systems:  Review of Systems  Constitutional:       Obese  HENT: Positive for congestion. Negative for ear pain, hearing loss, sore throat and tinnitus.   Eyes: Positive for redness. Negative for photophobia and pain.       Left eye  Respiratory: Positive for cough. Negative for shortness of breath.   Cardiovascular: Positive for leg swelling. Negative for chest pain and palpitations.       Chronic BLE, dermatosclerosis changes  Gastrointestinal: Negative for abdominal pain, blood in stool, constipation, diarrhea, nausea and vomiting.  Genitourinary: Positive for frequency. Negative for dysuria, flank pain, hematuria and urgency.       The right   Musculoskeletal: Positive for back pain. Negative for myalgias.  Skin: Negative for rash.       Contusion right breast, tender lump palpated, s/p partial mastectomy. Contusion right knee.   Neurological: Negative for dizziness, weakness and headaches.  Psychiatric/Behavioral: The patient is nervous/anxious.     Health Maintenance  Topic Date Due  . DEXA SCAN  09/07/1997  . TETANUS/TDAP  10/05/2019  . INFLUENZA VACCINE  Completed  .  ZOSTAVAX  Completed  . PNA vac Low Risk Adult  Completed    Physical Exam: Vitals:   06/29/16 1356  BP: 120/82  Pulse: (!) 101  Resp: (!) 22  Temp: 97.7 F (36.5 C)  TempSrc: Oral  SpO2: 98%  Weight: 297 lb (134.7 kg)  Height: $Remove'5\' 6"'gXVPcba$  (1.676 m)   Body mass index is 47.94 kg/m. Physical Exam  Constitutional: She is oriented to person, place, and time. She appears well-developed and well-nourished. She is active and cooperative. No distress.  BP 144/81 mmHg  Pulse 87  Temp(Src) 98 F (36.7 C) (Oral)  Resp 17  SpO2 95%  HENT:  Head: Normocephalic and atraumatic.  Right Ear: Hearing and external ear normal.  Left Ear: Hearing and external ear normal.  Nose: Nose normal.  Mouth/Throat: Oropharynx is clear and moist.  Eyes: Conjunctivae are normal. No scleral icterus.  Left eye redness, no itching.   Neck: Normal range of motion. Neck supple. No thyromegaly present.  Cardiovascular: Normal rate, regular rhythm, normal heart sounds and intact distal pulses.   Pulses:      Radial pulses are 2+ on the right side, and 2+ on the left side.       Dorsalis pedis pulses are 1+ on the right side, and 1+ on the left side.  Pulmonary/Chest: Effort normal. She has wheezes.  Abdominal: Soft. There is no tenderness.  Musculoskeletal: She exhibits edema.       Lumbar back: She exhibits decreased range of motion and pain. She exhibits no tenderness, no bony tenderness, no swelling and no edema.       Back:  Electric w/c for mobility Chronic BLE dermatosclerosis, trace edema   Lymphadenopathy:       Head (right side): No tonsillar, no preauricular, no posterior auricular and no occipital adenopathy present.       Head (left side): No tonsillar, no preauricular, no posterior auricular and no occipital adenopathy present.    She has no cervical adenopathy.       Right: No supraclavicular adenopathy present.       Left: No supraclavicular adenopathy present.  Both  lower legs with  substantial swelling and skin changes consistent with the chronic swelling.  Neurological: She is alert and oriented to person, place, and time. No sensory deficit.  Skin: Skin is warm, dry and intact. No cyanosis or erythema. Nails show no clubbing.  Contusion right breast, tender lump palpated, s/p partial mastectomy. Contusion right knee, 3x3 erythema and bruised area noted.    Psychiatric: She has a normal mood and affect. Her speech is normal and behavior is normal.    Labs reviewed: Basic Metabolic Panel:  Recent Labs  07/26/15 0751 08/31/15 02/29/16 06/20/16  NA 140 141 141  --   K 3.6 3.9 3.7  --   CL 101  --   --   --   CO2 27  --   --   --   GLUCOSE 127*  --   --   --   BUN 15 17 22*  --   CREATININE 0.80 0.6 0.9  --   CALCIUM 8.3*  --   --   --   TSH  --  0.03* 0.04* 0.92   Liver Function Tests:  Recent Labs  07/06/15 02/29/16  AST 12* 13  ALT 15 11  ALKPHOS 76 80   No results for input(s): LIPASE, AMYLASE in the last 8760 hours. No results for input(s): AMMONIA in the last 8760 hours. CBC:  Recent Labs  07/06/15 07/26/15 0751 02/29/16  WBC 8.5 6.3 5.6  NEUTROABS  --  4.5  --   HGB 12.0 12.0 12.4  HCT 35* 36.3 37  MCV  --  88.3  --   PLT 219 145* 170   Lipid Panel: No results for input(s): CHOL, HDL, LDLCALC, TRIG, CHOLHDL, LDLDIRECT in the last 8760 hours. Lab Results  Component Value Date   HGBA1C 6.2 06/23/2015    Procedures since last visit: No results found.  Assessment/Plan  Acute bronchitis Persisted hacking cough and expiratory wheezes, will continue OTC Robitussin, Advair HHI. observe  Hypothyroidism continue Levothyroxine 167mcg since 03/06/16, TSH 0.04 02/29/16, 0.92 06/20/16. TSH in 4 months  Essential hypertension Controlled, continue Amlodipine Valsartan 5/160mg  daily. HCTZ 12.5mg  daily. Torsemide 20mg . 02/29/16 Na 141, K 3.7, Bun 22, creat 0.86, Hgb 12.4  Acid reflux Stable, continue Omeprazole   IBS (irritable bowel  syndrome) Stable, continue prn Lomotil.   Anxiety and depression Mood is stable, Alprazolam 0.125mg  bid prn. Observe the patient.    Lymphedema of both lower extremities Chronic 2+, Continue HCTZ, Torsemide.   Hypokalemia Normalized, last K 3.7 02/29/16   Labs/tests ordered: none  Next appt:  4 months

## 2016-06-29 NOTE — Assessment & Plan Note (Signed)
Mood is stable, Alprazolam 0.125mg  bid prn. Observe the patient.

## 2016-07-10 ENCOUNTER — Encounter: Payer: Self-pay | Admitting: Nurse Practitioner

## 2016-07-12 MED ORDER — HYDROCHLOROTHIAZIDE 12.5 MG PO TABS: 12.5000 mg | ORAL_TABLET | Freq: Every day | ORAL | 1 refills | Status: DC

## 2016-07-21 ENCOUNTER — Other Ambulatory Visit: Payer: Self-pay | Admitting: Internal Medicine

## 2016-08-01 DIAGNOSIS — N3946 Mixed incontinence: Secondary | ICD-10-CM | POA: Diagnosis not present

## 2016-08-01 DIAGNOSIS — N281 Cyst of kidney, acquired: Secondary | ICD-10-CM | POA: Diagnosis not present

## 2016-08-01 DIAGNOSIS — N200 Calculus of kidney: Secondary | ICD-10-CM | POA: Diagnosis not present

## 2016-08-01 DIAGNOSIS — R310 Gross hematuria: Secondary | ICD-10-CM | POA: Diagnosis not present

## 2016-08-04 DIAGNOSIS — R310 Gross hematuria: Secondary | ICD-10-CM | POA: Diagnosis not present

## 2016-08-04 DIAGNOSIS — N200 Calculus of kidney: Secondary | ICD-10-CM | POA: Diagnosis not present

## 2016-08-10 ENCOUNTER — Encounter: Payer: Self-pay | Admitting: Nurse Practitioner

## 2016-08-10 ENCOUNTER — Non-Acute Institutional Stay: Payer: Medicare Other | Admitting: Nurse Practitioner

## 2016-08-10 DIAGNOSIS — R319 Hematuria, unspecified: Secondary | ICD-10-CM | POA: Insufficient documentation

## 2016-08-10 DIAGNOSIS — I890 Lymphedema, not elsewhere classified: Secondary | ICD-10-CM

## 2016-08-10 DIAGNOSIS — I10 Essential (primary) hypertension: Secondary | ICD-10-CM | POA: Diagnosis not present

## 2016-08-10 DIAGNOSIS — F329 Major depressive disorder, single episode, unspecified: Secondary | ICD-10-CM

## 2016-08-10 DIAGNOSIS — M25512 Pain in left shoulder: Secondary | ICD-10-CM | POA: Diagnosis not present

## 2016-08-10 DIAGNOSIS — F419 Anxiety disorder, unspecified: Secondary | ICD-10-CM

## 2016-08-10 DIAGNOSIS — G8929 Other chronic pain: Secondary | ICD-10-CM | POA: Diagnosis not present

## 2016-08-10 DIAGNOSIS — F418 Other specified anxiety disorders: Secondary | ICD-10-CM

## 2016-08-10 DIAGNOSIS — R310 Gross hematuria: Secondary | ICD-10-CM

## 2016-08-10 DIAGNOSIS — E039 Hypothyroidism, unspecified: Secondary | ICD-10-CM | POA: Diagnosis not present

## 2016-08-10 DIAGNOSIS — K219 Gastro-esophageal reflux disease without esophagitis: Secondary | ICD-10-CM | POA: Diagnosis not present

## 2016-08-10 NOTE — Progress Notes (Signed)
Patient ID: Tamara Morales, female   DOB: [birth year], 83 y.o.   MRN: [id number]   Location:   clinic [place]   Place of Service:  [facility]  Provider: Marlana Latus NP  Code Status: DNR  [place] Care: [place]  Advanced Directives 08/10/2016  Does Patient Have a Medical Advance Directive? Yes  Type of Paramedic of [place];Living will  Does patient want to make changes to medical advance directive? No - Patient declined  Copy of [place] in Chart? -  Would patient like information on creating a medical advance directive? -  Pre-existing out of facility DNR order (yellow form or pink MOST form) -     Chief Complaint  Patient presents with  . Acute Visit    left shoulder/arm pain/numbness, no chest pain    HPI: Patient is a 84 y.o. female seen today for left shoulder pain and left arm numbness when the left hand reaches the back of her head, not new, denied trauma or injury. Denied chest pain, SOB, palpitation, or nausea/vomiting     Hx of kidney stone, off Flomax. Takes HCTZ and Torsemide  for chronic BLE edema, taking Kcl 78meq daily, 02/29/16 Na 141, K 3.7, Bun 22, creat 0.9, HTN controlled on Amlodipine/Valsarta 5/160mg  daily. Depression/anxiety, mood is stable, sleeps well while on Alprazolam prn bid and Celexa.. Hypothyroidism is well supplemented while on Synthroid 133mcg since 03/06/16 TSH 0.04 02/29/16, 0.92 06/20/16.  Asymptomatic GERD while on Omeprazole 20mg  qd   Past Medical History:  Diagnosis Date  . ARTHRITIS, KNEES, BILATERAL   . Breast CA ([place]) 2000   s/p R lumpectomy and XRT  . DEPRESSION   . DJD (degenerative joint disease) of knee   . GERD   . HYPERLIPIDEMIA   . HYPERTENSION   . HYPOTHYROIDISM    postsurgical  . Hypoxia   . MIGRAINE HEADACHE   . Morbid obesity ([place])   . OSA (obstructive sleep apnea) 01/17/2011 dx  . Oxygen dependent   . Urge incontinence   . UTI (lower urinary tract infection) 12/2014     Past Surgical History:  Procedure Laterality Date  . ABDOMINAL HYSTERECTOMY    . ADENOIDECTOMY    . BREAST BIOPSY  2000  . BREAST LUMPECTOMY  2000  . CATARACT EXTRACTION    . CHOLECYSTECTOMY  1995  . CYSTOSCOPY/URETEROSCOPY/HOLMIUM LASER Right 06/28/2015   Procedure: CYSTOSCOPY RIGHT RETROGRAD RIGHT URETEROSCOPY/HOLMIUM LASER WITH RIGHT STENT PLACEMENT;  Surgeon: Alexis Frock, MD;  Location: [facility];  Service: Urology;  Laterality: Right;  . PARATHYROIDECTOMY     2-3 removed  . REPLACEMENT TOTAL KNEE BILATERAL  1999, 2005  . Right leg femur repaired    . THYROIDECTOMY    . TONSILLECTOMY    . TUBAL LIGATION      Allergies  Allergen Reactions  . Azithromycin Itching  . Beta Adrenergic Blockers     Depression   . Ciprofloxacin Other (See Comments)    REACTION: edgy and very jumpy   . Codeine Nausea And Vomiting  . Epinephrine Other (See Comments)    REACTION: rapid pulse, sweats  . Klonopin [Clonazepam] Other (See Comments)    Makes her feel very suicidal   . Oxycodone Other (See Comments)    Patient felt like it altered her mental status "Crazy" . Hallucinations later as well.  [name] Paxil [Paroxetine Hydrochloride] Other (See Comments)    Sever depression   . Prednisone     "makes me feel  really bad"  . Propoxyphene Hcl Nausea And Vomiting  . Tramadol Other (See Comments)    Feels "jittery"  . Vicodin [Hydrocodone-Acetaminophen] Other (See Comments)    Hallucinations  . Meloxicam Diarrhea  . Vancomycin Rash    Localized rash related to infusion rate.    Allergies as of 08/10/2016      Reactions   Azithromycin Itching   Beta Adrenergic Blockers    Depression   Ciprofloxacin Other (See Comments)   REACTION: edgy and very jumpy    Codeine Nausea And Vomiting   Epinephrine Other (See Comments)   REACTION: rapid pulse, sweats   Klonopin [clonazepam] Other (See Comments)   Makes her feel very suicidal    Oxycodone Other (See Comments)   Patient felt like it  altered her mental status "Crazy" . Hallucinations later as well.   Paxil [paroxetine Hydrochloride] Other (See Comments)   Sever depression    Prednisone    "makes me feel really bad"   Propoxyphene Hcl Nausea And Vomiting   Tramadol Other (See Comments)   Feels "jittery"   Vicodin [hydrocodone-acetaminophen] Other (See Comments)   Hallucinations   Meloxicam Diarrhea   Vancomycin Rash   Localized rash related to infusion rate.      Medication List       Accurate as of 08/10/16  5:14 PM. Always use your most recent med list.          acetaminophen 325 MG tablet Commonly known as:  TYLENOL Take 650 mg by mouth daily.   ALPRAZolam 0.25 MG tablet Commonly known as:  XANAX Take 1/2 tablet by mouth twice daily as needed for anxiety   amLODipine-valsartan 5-160 MG tablet Commonly known as:  EXFORGE TAKE 1 TABLET ONCE DAILY.   aspirin 81 MG tablet Take 81 mg by mouth daily.   benzonatate 100 MG capsule Commonly known as:  TESSALON Take 2 capsules by mouth as needed.   cetirizine 10 MG tablet Commonly known as:  ZYRTEC Take 10 mg by mouth daily. Reported on 11/27/2015   citalopram 10 MG tablet Commonly known as:  CELEXA Take one tablet by mouth once daily for depression   clotrimazole 1 % cream Commonly known as:  LOTRIMIN Apply 1 application topically 2 (two) times daily. As needed for fungal skin infections   diphenoxylate-atropine 2.5-0.025 MG tablet Commonly known as:  LOMOTIL TAKE 1 TABLET FOUR TIMES DAILY AS NEEDED FOR DIARRHEA OR LOOSE STOOLS.   FIBER PO Take 2 tablets by mouth every morning.   Fish Oil 1200 MG Caps Take 2,400 mg by mouth daily.   fluticasone 50 MCG/ACT nasal spray Commonly known as:  FLONASE Place 2 sprays into both nostrils daily.   fluticasone-salmeterol 45-21 MCG/ACT inhaler Commonly known as:  ADVAIR HFA Inhale 1 puff into the lungs 2 (two) times daily.   guaiFENesin 600 MG 12 hr tablet Commonly known as:  MUCINEX Take 600  mg by mouth 2 (two) times daily.   hydrochlorothiazide 12.5 MG tablet Commonly known as:  HYDRODIURIL Take 1 tablet (12.5 mg total) by mouth daily.   isometheptene-acetaminophen-dichloralphenazone 65-100-325 MG capsule Commonly known as:  MIDRIN Take 1 capsule by mouth every 4 (four) hours as needed for migraine.   levothyroxine 150 MCG tablet Commonly known as:  SYNTHROID, LEVOTHROID Take 1 tablet (150 mcg total) by mouth daily.   nystatin powder Generic drug:  nystatin APPLY TO AFFECTED AREA TWICE A DAY   omeprazole 20 MG capsule Commonly known as:  PRILOSEC TAKE (1)  CAPSULE DAILY.   potassium chloride 10 MEQ CR capsule Commonly known as:  MICRO-K TAKE 4 CAPSULES DAILY FOR POTASSIUM   torsemide 20 MG tablet Commonly known as:  DEMADEX Take 1 tablet (20 mg total) by mouth daily.       Review of Systems:  Review of Systems  Constitutional:       Obese  HENT: Negative for ear pain, hearing loss, sore throat and tinnitus.   Eyes: Negative for photophobia and pain.       Left eye  Respiratory: Positive for cough. Negative for shortness of breath.   Cardiovascular: Positive for leg swelling. Negative for chest pain and palpitations.       Chronic BLE, dermatosclerosis changes  Gastrointestinal: Negative for abdominal pain, blood in stool, constipation, diarrhea, nausea and vomiting.  Genitourinary: Positive for frequency. Negative for dysuria, flank pain, hematuria and urgency.       The right   Musculoskeletal: Positive for back pain. Negative for myalgias.  Skin: Negative for rash.   Neurological: Negative for dizziness, weakness and headaches.  Psychiatric/Behavioral: The patient is nervous/anxious.     Health Maintenance  Topic Date Due  . DEXA SCAN  09/07/1997  . TETANUS/TDAP  10/05/2019  . INFLUENZA VACCINE  Completed  . PNA vac Low Risk Adult  Completed    Physical Exam: Vitals:   08/10/16 1433  BP: 136/78  Pulse: 94  Resp: 20  Temp: 98.3 F (36.8  C)  SpO2: 96%  Weight: (!) 302 lb 6.4 oz (137.2 kg)  Height: $Remove'5\' 6"'VsoXxJR$  (1.676 m)   Body mass index is 48.81 kg/m. Physical Exam  Constitutional: She is oriented to person, place, and time. She appears well-developed and well-nourished. She is active and cooperative. No distress.  BP 144/81 mmHg  Pulse 87  Temp(Src) 98 F (36.7 C) (Oral)  Resp 17  SpO2 95%  HENT:  Head: Normocephalic and atraumatic.  Right Ear: Hearing and external ear normal.  Left Ear: Hearing and external ear normal.  Nose: Nose normal.  Mouth/Throat: Oropharynx is clear and moist.  Eyes: Conjunctivae are normal. No scleral icterus.   Neck: Normal range of motion. Neck supple. No thyromegaly present.  Cardiovascular: Normal rate, regular rhythm, normal heart sounds and intact distal pulses.   Pulses:      Radial pulses are 2+ on the right side, and 2+ on the left side.       Dorsalis pedis pulses are 1+ on the right side, and 1+ on the left side.  Pulmonary/Chest: Effort normal. She has wheezes.  Abdominal: Soft. There is no tenderness.  Musculoskeletal: She exhibits edema.       Lumbar back: She exhibits decreased range of motion and pain. She exhibits no tenderness, no bony tenderness, no swelling and no edema.       Back: Electric w/c for mobility Chronic BLE dermatosclerosis, trace edema   Lymphadenopathy:       Head (right side): No tonsillar, no preauricular, no posterior auricular and no occipital adenopathy present.       Head (left side): No tonsillar, no preauricular, no posterior auricular and no occipital adenopathy present.    She has no cervical adenopathy.       Right: No supraclavicular adenopathy present.       Left: No supraclavicular adenopathy present.  Both lower legs with substantial swelling and skin changes consistent with the chronic swelling.  Neurological: She is alert and oriented to person, place, and time. No sensory deficit.  Skin: Skin is warm, dry and intact. No cyanosis or  erythema. Nails show no clubbing.  s/p partial mastectomy.    Psychiatric: She has a normal mood and affect. Her speech is normal and behavior is normal.    Labs reviewed: Basic Metabolic Panel:  Recent Labs  08/31/15 02/29/16 06/20/16  NA 141 141  --   K 3.9 3.7  --   BUN 17 22*  --   CREATININE 0.6 0.9  --   TSH 0.03* 0.04* 0.92   Liver Function Tests:  Recent Labs  02/29/16  AST 13  ALT 11  ALKPHOS 80   No results for input(s): LIPASE, AMYLASE in the last 8760 hours. No results for input(s): AMMONIA in the last 8760 hours. CBC:  Recent Labs  02/29/16  WBC 5.6  HGB 12.4  HCT 37  PLT 170   Lipid Panel: No results for input(s): CHOL, HDL, LDLCALC, TRIG, CHOLHDL, LDLDIRECT in the last 8760 hours. Lab Results  Component Value Date   HGBA1C 6.2 06/23/2015    Procedures since last visit: No results found.  Assessment/Plan  Chronic left shoulder pain Pain and numbness when left hand reaches over the back of her head, desires PT to eval and tx.   Essential hypertension Controlled, continue Amlodipine Valsartan 5/160mg  daily. HCTZ 12.5mg  daily. Torsemide 20mg . 02/29/16 Na 141, K 3.7, Bun 22, creat 0.86, Hgb 12.4. Update CBC CMP TSH in 3 months  Hypothyroidism Continue Levothyroxine 117mcg, 06/20/16 TSH 0.92, update TSH in 3 months.   Lymphedema of both lower extremities Continue Torsemide 20mg , HCT 12.5mg  daily. Update CMP 3 months.   Hematuria F/u Urology, pending cystoscopy, complete ABT presently, denied dysuria or urinary frequency.   Anxiety and depression Mood is stable, continue Celexa and Alprazolam 0.125mg  bid prn. Observe the patient.   Acid reflux Stable, continue Omeprazole.    Labs/tests ordered: none  Next appt: as needed    `

## 2016-08-10 NOTE — Assessment & Plan Note (Signed)
Mood is stable, continue Celexa and Alprazolam 0.$RemoveBeforeDE'125mg'[id number]$  bid prn. Observe the patient.

## 2016-08-10 NOTE — Assessment & Plan Note (Signed)
Controlled, continue Amlodipine Valsartan 5/160mg  daily. HCTZ 12.5mg  daily. Torsemide 20mg . 02/29/16 Na 141, K 3.7, Bun 22, creat 0.86, Hgb 12.4. Update CBC CMP TSH in 3 months

## 2016-08-10 NOTE — Assessment & Plan Note (Signed)
F/u Urology, pending cystoscopy, complete ABT presently, denied dysuria or urinary frequency.

## 2016-08-10 NOTE — Assessment & Plan Note (Signed)
Continue Torsemide 20mg , HCT 12.5mg  daily. Update CMP 3 months.

## 2016-08-10 NOTE — Assessment & Plan Note (Signed)
Pain and numbness when left hand reaches over the back of her head, desires PT to eval and tx.

## 2016-08-10 NOTE — Assessment & Plan Note (Signed)
Continue Levothyroxine 14mcg, 06/20/16 TSH 0.92, update TSH in 3 months.

## 2016-08-10 NOTE — Assessment & Plan Note (Signed)
Stable, continue Omeprazole.  

## 2016-08-15 DIAGNOSIS — M25512 Pain in left shoulder: Secondary | ICD-10-CM | POA: Diagnosis not present

## 2016-08-15 DIAGNOSIS — M6281 Muscle weakness (generalized): Secondary | ICD-10-CM | POA: Diagnosis not present

## 2016-08-15 DIAGNOSIS — R2681 Unsteadiness on feet: Secondary | ICD-10-CM | POA: Diagnosis not present

## 2016-08-15 DIAGNOSIS — M25552 Pain in left hip: Secondary | ICD-10-CM | POA: Diagnosis not present

## 2016-08-15 DIAGNOSIS — M545 Low back pain: Secondary | ICD-10-CM | POA: Diagnosis not present

## 2016-08-22 DIAGNOSIS — M25552 Pain in left hip: Secondary | ICD-10-CM | POA: Diagnosis not present

## 2016-08-22 DIAGNOSIS — M545 Low back pain: Secondary | ICD-10-CM | POA: Diagnosis not present

## 2016-08-22 DIAGNOSIS — N200 Calculus of kidney: Secondary | ICD-10-CM | POA: Diagnosis not present

## 2016-08-22 DIAGNOSIS — M25512 Pain in left shoulder: Secondary | ICD-10-CM | POA: Diagnosis not present

## 2016-08-22 DIAGNOSIS — R310 Gross hematuria: Secondary | ICD-10-CM | POA: Diagnosis not present

## 2016-08-22 DIAGNOSIS — M6281 Muscle weakness (generalized): Secondary | ICD-10-CM | POA: Diagnosis not present

## 2016-08-22 DIAGNOSIS — N281 Cyst of kidney, acquired: Secondary | ICD-10-CM | POA: Diagnosis not present

## 2016-08-22 DIAGNOSIS — N3946 Mixed incontinence: Secondary | ICD-10-CM | POA: Diagnosis not present

## 2016-08-22 DIAGNOSIS — R2681 Unsteadiness on feet: Secondary | ICD-10-CM | POA: Diagnosis not present

## 2016-08-23 ENCOUNTER — Telehealth: Payer: Self-pay | Admitting: *Deleted

## 2016-08-23 NOTE — Telephone Encounter (Signed)
Called Sani Loiseau spouse to inform her that [facility] did put her husband on Zoloft 25 mg daily. She stated that she understood and that last time it had to be cut in half because he was sleeping to much. But she agreed to start at the 25 mg. And we could cut it in half after he gets over this hump of anxiety.

## 2016-08-25 DIAGNOSIS — M25552 Pain in left hip: Secondary | ICD-10-CM | POA: Diagnosis not present

## 2016-08-25 DIAGNOSIS — M545 Low back pain: Secondary | ICD-10-CM | POA: Diagnosis not present

## 2016-08-25 DIAGNOSIS — M25512 Pain in left shoulder: Secondary | ICD-10-CM | POA: Diagnosis not present

## 2016-08-25 DIAGNOSIS — R2681 Unsteadiness on feet: Secondary | ICD-10-CM | POA: Diagnosis not present

## 2016-08-25 DIAGNOSIS — M6281 Muscle weakness (generalized): Secondary | ICD-10-CM | POA: Diagnosis not present

## 2016-08-28 ENCOUNTER — Ambulatory Visit: Payer: Self-pay | Admitting: Pulmonary Disease

## 2016-08-29 DIAGNOSIS — M25512 Pain in left shoulder: Secondary | ICD-10-CM | POA: Diagnosis not present

## 2016-08-29 DIAGNOSIS — R2681 Unsteadiness on feet: Secondary | ICD-10-CM | POA: Diagnosis not present

## 2016-08-29 DIAGNOSIS — M545 Low back pain: Secondary | ICD-10-CM | POA: Diagnosis not present

## 2016-08-29 DIAGNOSIS — M25552 Pain in left hip: Secondary | ICD-10-CM | POA: Diagnosis not present

## 2016-08-29 DIAGNOSIS — M6281 Muscle weakness (generalized): Secondary | ICD-10-CM | POA: Diagnosis not present

## 2016-08-30 DIAGNOSIS — L814 Other melanin hyperpigmentation: Secondary | ICD-10-CM | POA: Diagnosis not present

## 2016-08-30 DIAGNOSIS — D1801 Hemangioma of skin and subcutaneous tissue: Secondary | ICD-10-CM | POA: Diagnosis not present

## 2016-08-30 DIAGNOSIS — L821 Other seborrheic keratosis: Secondary | ICD-10-CM | POA: Diagnosis not present

## 2016-08-31 DIAGNOSIS — R3129 Other microscopic hematuria: Secondary | ICD-10-CM | POA: Diagnosis not present

## 2016-08-31 DIAGNOSIS — N390 Urinary tract infection, site not specified: Secondary | ICD-10-CM | POA: Diagnosis not present

## 2016-08-31 DIAGNOSIS — R110 Nausea: Secondary | ICD-10-CM | POA: Diagnosis not present

## 2016-09-05 ENCOUNTER — Ambulatory Visit: Payer: Self-pay | Admitting: Pulmonary Disease

## 2016-09-07 ENCOUNTER — Other Ambulatory Visit: Payer: Self-pay | Admitting: *Deleted

## 2016-09-07 DIAGNOSIS — N390 Urinary tract infection, site not specified: Secondary | ICD-10-CM

## 2016-09-10 ENCOUNTER — Other Ambulatory Visit: Payer: Self-pay | Admitting: Nurse Practitioner

## 2016-09-10 DIAGNOSIS — I10 Essential (primary) hypertension: Secondary | ICD-10-CM

## 2016-09-12 DIAGNOSIS — M545 Low back pain: Secondary | ICD-10-CM | POA: Diagnosis not present

## 2016-09-12 DIAGNOSIS — M25512 Pain in left shoulder: Secondary | ICD-10-CM | POA: Diagnosis not present

## 2016-09-12 DIAGNOSIS — M6281 Muscle weakness (generalized): Secondary | ICD-10-CM | POA: Diagnosis not present

## 2016-09-12 DIAGNOSIS — M25552 Pain in left hip: Secondary | ICD-10-CM | POA: Diagnosis not present

## 2016-09-12 DIAGNOSIS — R300 Dysuria: Secondary | ICD-10-CM | POA: Diagnosis not present

## 2016-09-12 DIAGNOSIS — R2681 Unsteadiness on feet: Secondary | ICD-10-CM | POA: Diagnosis not present

## 2016-09-13 ENCOUNTER — Ambulatory Visit: Payer: Self-pay | Admitting: Pulmonary Disease

## 2016-09-14 DIAGNOSIS — R300 Dysuria: Secondary | ICD-10-CM | POA: Diagnosis not present

## 2016-09-19 ENCOUNTER — Telehealth: Payer: Self-pay | Admitting: Nurse Practitioner

## 2016-09-19 NOTE — Telephone Encounter (Signed)
Called patient to let her know that her urinalysis came back normal. She verbalized understanding and had no questions.

## 2016-09-21 DIAGNOSIS — R2681 Unsteadiness on feet: Secondary | ICD-10-CM | POA: Diagnosis not present

## 2016-09-21 DIAGNOSIS — M25552 Pain in left hip: Secondary | ICD-10-CM | POA: Diagnosis not present

## 2016-09-21 DIAGNOSIS — M6281 Muscle weakness (generalized): Secondary | ICD-10-CM | POA: Diagnosis not present

## 2016-09-21 DIAGNOSIS — M545 Low back pain: Secondary | ICD-10-CM | POA: Diagnosis not present

## 2016-09-21 DIAGNOSIS — M25512 Pain in left shoulder: Secondary | ICD-10-CM | POA: Diagnosis not present

## 2016-09-28 ENCOUNTER — Telehealth: Payer: Self-pay

## 2016-09-28 ENCOUNTER — Encounter: Payer: Medicare Other | Admitting: Internal Medicine

## 2016-09-28 NOTE — Telephone Encounter (Signed)
Patient stopped by the office ask if she could get appointment. She is having pain in her left neck, start with numbness in left hand, shooting pains up arm to neck.  I told her that Dr. Nyoka Cowden had a full schedule, that I would check, if the last patient was coming, if she wanted to wait, I was with another patient at that time. I asked Dr. Nyoka Cowden if we could work her in, that was find. I told the patient if she could return about 3:30-3:45 we could see her. "Well I have to be out by 4:00". Told her if she came back at 3:30, I would get her in before the last patient comes. When I went to get the 3:15 patient to bring back at 3:35, Tamara Morales said "I guess I won't stay, it doesn't look like I would be out by 4:00". I told her ok, that it was up to her.

## 2016-10-05 ENCOUNTER — Non-Acute Institutional Stay: Payer: Medicare Other | Admitting: Nurse Practitioner

## 2016-10-05 ENCOUNTER — Encounter: Payer: Self-pay | Admitting: Nurse Practitioner

## 2016-10-05 DIAGNOSIS — F419 Anxiety disorder, unspecified: Secondary | ICD-10-CM

## 2016-10-05 DIAGNOSIS — I10 Essential (primary) hypertension: Secondary | ICD-10-CM | POA: Diagnosis not present

## 2016-10-05 DIAGNOSIS — E662 Morbid (severe) obesity with alveolar hypoventilation: Secondary | ICD-10-CM | POA: Diagnosis not present

## 2016-10-05 DIAGNOSIS — M542 Cervicalgia: Secondary | ICD-10-CM | POA: Insufficient documentation

## 2016-10-05 DIAGNOSIS — E039 Hypothyroidism, unspecified: Secondary | ICD-10-CM

## 2016-10-05 DIAGNOSIS — F329 Major depressive disorder, single episode, unspecified: Secondary | ICD-10-CM | POA: Diagnosis not present

## 2016-10-05 DIAGNOSIS — N390 Urinary tract infection, site not specified: Secondary | ICD-10-CM | POA: Diagnosis not present

## 2016-10-05 DIAGNOSIS — K219 Gastro-esophageal reflux disease without esophagitis: Secondary | ICD-10-CM | POA: Diagnosis not present

## 2016-10-05 DIAGNOSIS — R05 Cough: Secondary | ICD-10-CM

## 2016-10-05 DIAGNOSIS — R079 Chest pain, unspecified: Secondary | ICD-10-CM | POA: Diagnosis not present

## 2016-10-05 DIAGNOSIS — R059 Cough, unspecified: Secondary | ICD-10-CM

## 2016-10-05 LAB — HEPATIC FUNCTION PANEL
ALK PHOS: 88 U/L (ref 25–125)
ALT: 14 U/L (ref 7–35)
AST: 20 U/L (ref 13–35)
Bilirubin, Total: 0.4 mg/dL

## 2016-10-05 LAB — TSH: TSH: 0.43 u[IU]/mL (ref <=5.90)

## 2016-10-05 LAB — BASIC METABOLIC PANEL
BUN: 18 mg/dL (ref 4–21)
CREATININE: 0.8 mg/dL (ref <=1.1)
Glucose: 90 mg/dL
POTASSIUM: 4.6 mmol/L (ref 3.4–5.3)
Sodium: 142 mmol/L (ref 137–147)

## 2016-10-05 LAB — CBC AND DIFFERENTIAL
HCT: 41 % (ref 36–46)
HEMOGLOBIN: 13.6 g/dL (ref 12.0–16.0)
PLATELETS: 167 10*3/uL (ref 150–399)
WBC: 6.4 10^3/mL

## 2016-10-05 LAB — HEMOGLOBIN A1C: HEMOGLOBIN A1C: 6

## 2016-10-05 NOTE — Assessment & Plan Note (Signed)
Continue Levothyroxine 17mcg qd, update CBC, CMP, UA C/S, Hgb a1c, TSH

## 2016-10-05 NOTE — Assessment & Plan Note (Signed)
Mood is stable, continue Celexa $RemoveBeforeD'10mg'[id number]$  qd and Alprazolam 0.$RemoveBeforeDE'125mg'[id number]$  bid prn. Observe the patient.

## 2016-10-05 NOTE — Assessment & Plan Note (Signed)
Controlled, continue Amlodipine Valsartan 5/160mg  daily. HCTZ 12.5mg  daily. Torsemide 20mg 

## 2016-10-05 NOTE — Assessment & Plan Note (Signed)
Worsened since last night, non productive, afebrile, no O2 desaturation, obtain CXR, continue Tessalon, Zyrtec, Flonase, Advair HFA, Mucinex.

## 2016-10-05 NOTE — Assessment & Plan Note (Signed)
Left neck pain x 2 weeks, denied trauma or injury, pain palpated and with ROM of neck, CT cervical spine 03/17/16 showed degenerative changes. Will obtain X-ray cervical spine to evaluate further. Tylenol prn available to her, she said its effective.

## 2016-10-05 NOTE — Assessment & Plan Note (Signed)
Stable, continue Omeprazole.  

## 2016-10-05 NOTE — Progress Notes (Signed)
Patient ID: Tamara Morales, female   DOB: 25-Apr-1931, 83 y.o.   MRN: [id number]   Location:   cl[facility] [place]   Place of Service: clinic [place]  Provider: Marlana Latus NP  Code Status: DNR  Goals of Care: [place]  Advanced Directives 10/05/2016  Does Patient Have a Medical Advance Directive? Yes  Type of Paramedic of [place];Living will  Does patient want to make changes to medical advance directive? No - Patient declined  Copy of [place] in Chart? Yes  Would patient like information on creating a medical advance directive? -  Pre-existing out of facility DNR order (yellow form or pink MOST form) -     Chief Complaint  Patient presents with  . Acute Visit    pain in (L) side of neck, having coughing, diarrhea, runny nose, possible viral started over night    HPI: Patient is a 83 y.o. female seen today for neck pain, left, x 2weeks, positional, had CT cervical spine 03/17/16, Tylenol is effective. Non productive cough/diarrhea(not today) started last night, afebrile, no O2 desaturation.   Hx of kidney stone, off Flomax. Takes HCTZ and Torsemide  for chronic BLE edema, taking Kcl 52meq daily. HTN controlled on Amlodipine/Valsarta 5/160mg  daily. Depression/anxiety, mood is stable, sleeps well while on Alprazolam prn bid and Celexa 10mg .  Hypothyroidism is well supplemented while on Synthroid 1110mcg since 03/06/16 TSH 0.92 06/20/16.  Asymptomatic GERD while on Omeprazole 20mg  qd   Past Medical History:  Diagnosis Date  . ARTHRITIS, KNEES, BILATERAL   . Breast CA ([place]) 2000   s/p R lumpectomy and XRT  . DEPRESSION   . DJD (degenerative joint disease) of knee   . GERD   . HYPERLIPIDEMIA   . HYPERTENSION   . HYPOTHYROIDISM    postsurgical  . Hypoxia   . MIGRAINE HEADACHE   . Morbid obesity ([place])   . OSA (obstructive sleep apnea) 01/17/2011 dx  . Oxygen dependent   . Urge incontinence   . UTI (lower urinary tract infection) 12/2014    Past  Surgical History:  Procedure Laterality Date  . ABDOMINAL HYSTERECTOMY    . ADENOIDECTOMY    . BREAST BIOPSY  2000  . BREAST LUMPECTOMY  2000  . CATARACT EXTRACTION    . CHOLECYSTECTOMY  1995  . CYSTOSCOPY/URETEROSCOPY/HOLMIUM LASER Right 06/28/2015   Procedure: CYSTOSCOPY RIGHT RETROGRAD RIGHT URETEROSCOPY/HOLMIUM LASER WITH RIGHT STENT PLACEMENT;  Surgeon: Alexis Frock, MD;  Location: [facility];  Service: Urology;  Laterality: Right;  . PARATHYROIDECTOMY     2-3 removed  . REPLACEMENT TOTAL KNEE BILATERAL  1999, 2005  . Right leg femur repaired    . THYROIDECTOMY    . TONSILLECTOMY    . TUBAL LIGATION      Allergies  Allergen Reactions  . Azithromycin Itching  . Beta Adrenergic Blockers     Depression   . Ciprofloxacin Other (See Comments)    REACTION: edgy and very jumpy   . Codeine Nausea And Vomiting  . Epinephrine Other (See Comments)    REACTION: rapid pulse, sweats  . Klonopin [Clonazepam] Other (See Comments)    Makes her feel very suicidal   . Oxycodone Other (See Comments)    Patient felt like it altered her mental status "Crazy" . Hallucinations later as well.  [name] Paxil [Paroxetine Hydrochloride] Other (See Comments)    Sever depression   . Prednisone     "makes me feel really bad"  . Propoxyphene Hcl Nausea And  Vomiting  . Tramadol Other (See Comments)    Feels "jittery"  . Vicodin [Hydrocodone-Acetaminophen] Other (See Comments)    Hallucinations  . Meloxicam Diarrhea  . Vancomycin Rash    Localized rash related to infusion rate.    Allergies as of 10/05/2016      Reactions   Azithromycin Itching   Beta Adrenergic Blockers    Depression   Ciprofloxacin Other (See Comments)   REACTION: edgy and very jumpy    Codeine Nausea And Vomiting   Epinephrine Other (See Comments)   REACTION: rapid pulse, sweats   Klonopin [clonazepam] Other (See Comments)   Makes her feel very suicidal    Oxycodone Other (See Comments)   Patient felt like it altered her  mental status "Crazy" . Hallucinations later as well.   Paxil [paroxetine Hydrochloride] Other (See Comments)   Sever depression    Prednisone    "makes me feel really bad"   Propoxyphene Hcl Nausea And Vomiting   Tramadol Other (See Comments)   Feels "jittery"   Vicodin [hydrocodone-acetaminophen] Other (See Comments)   Hallucinations   Meloxicam Diarrhea   Vancomycin Rash   Localized rash related to infusion rate.      Medication List       Accurate as of 10/05/16  3:12 PM. Always use your most recent med list.          acetaminophen 325 MG tablet Commonly known as:  TYLENOL Take 650 mg by mouth daily.   ALPRAZolam 0.25 MG tablet Commonly known as:  XANAX Take 1/2 tablet by mouth twice daily as needed for anxiety   amLODipine-valsartan 5-160 MG tablet Commonly known as:  EXFORGE TAKE 1 TABLET ONCE DAILY.   aspirin 81 MG tablet Take 81 mg by mouth daily.   benzonatate 100 MG capsule Commonly known as:  TESSALON Take 2 capsules by mouth as needed.   cetirizine 10 MG tablet Commonly known as:  ZYRTEC Take 10 mg by mouth daily. Reported on 11/27/2015   citalopram 10 MG tablet Commonly known as:  CELEXA Take one tablet by mouth once daily for depression   clotrimazole 1 % cream Commonly known as:  LOTRIMIN Apply 1 application topically 2 (two) times daily. As needed for fungal skin infections   diphenoxylate-atropine 2.5-0.025 MG tablet Commonly known as:  LOMOTIL TAKE 1 TABLET FOUR TIMES DAILY AS NEEDED FOR DIARRHEA OR LOOSE STOOLS.   FIBER PO Take 2 tablets by mouth every morning.   Fish Oil 1200 MG Caps Take 2,400 mg by mouth daily.   fluticasone 50 MCG/ACT nasal spray Commonly known as:  FLONASE Place 2 sprays into both nostrils daily.   fluticasone-salmeterol 45-21 MCG/ACT inhaler Commonly known as:  ADVAIR HFA Inhale 1 puff into the lungs 2 (two) times daily.   guaiFENesin 600 MG 12 hr tablet Commonly known as:  MUCINEX Take 600 mg by mouth 2  (two) times daily.   hydrochlorothiazide 12.5 MG tablet Commonly known as:  HYDRODIURIL Take 1 tablet (12.5 mg total) by mouth daily.   isometheptene-acetaminophen-dichloralphenazone 65-100-325 MG capsule Commonly known as:  MIDRIN Take 1 capsule by mouth every 4 (four) hours as needed for migraine.   levothyroxine 150 MCG tablet Commonly known as:  SYNTHROID, LEVOTHROID Take 1 tablet (150 mcg total) by mouth daily.   nystatin powder Generic drug:  nystatin APPLY TO AFFECTED AREA TWICE A DAY   omeprazole 20 MG capsule Commonly known as:  PRILOSEC TAKE (1) CAPSULE DAILY.   potassium chloride 10 MEQ  CR capsule Commonly known as:  MICRO-K TAKE 4 CAPSULES DAILY FOR POTASSIUM   torsemide 20 MG tablet Commonly known as:  DEMADEX Take 1 tablet (20 mg total) by mouth daily.       Review of Systems:  Review of Systems  Constitutional:       Obese  HENT: Negative for ear pain, hearing loss, sore throat and tinnitus.   Eyes: Negative for photophobia and pain.       Left eye  Respiratory: Positive for cough. Negative for shortness of breath.   Cardiovascular: Positive for leg swelling. Negative for chest pain and palpitations.       Chronic BLE, dermatosclerosis changes  Gastrointestinal: Negative for abdominal pain, blood in stool, constipation, diarrhea x1 last night, nausea and vomiting.  Genitourinary: Positive for frequency. Negative for dysuria, flank pain, hematuria and urgency.       The right   Musculoskeletal: Positive for back pain. Negative for myalgias. left neck pain x 2weeks. Skin: Negative for rash.   Neurological: Negative for dizziness, weakness and headaches.  Psychiatric/Behavioral: The patient is nervous/anxious.     Health Maintenance  Topic Date Due  . DEXA SCAN  09/07/1997  . INFLUENZA VACCINE  01/17/2017  . TETANUS/TDAP  10/05/2019  . PNA vac Low Risk Adult  Completed    Physical Exam: Vitals:   10/05/16 1434  BP: (!) 142/86  Pulse: 85    Resp: (!) 22  Temp: 97.5 F (36.4 C)  SpO2: 96%  Weight: (!) 305 lb 6.4 oz (138.5 kg)  Height: 5\' 6"  (1.676 m)   Body mass index is 49.29 kg/m. Physical Exam  Constitutional: She is oriented to person, place, and time. She appears well-developed and well-nourished. She is active and cooperative. No distress.  BP 144/81 mmHg  Pulse 87  Temp(Src) 98 F (36.7 C) (Oral)  Resp 17  SpO2 95%  HENT:  Head: Normocephalic and atraumatic.  Right Ear: Hearing and external ear normal.  Left Ear: Hearing and external ear normal.  Nose: Nose normal.  Mouth/Throat: Oropharynx is clear and moist.  Eyes: Conjunctivae are normal. No scleral icterus.   Neck: Normal range of motion. Neck supple. No thyromegaly present. left neck pain palpated Cardiovascular: Normal rate, regular rhythm, normal heart sounds and intact distal pulses.   Pulses:      Radial pulses are 2+ on the right side, and 2+ on the left side.       Dorsalis pedis pulses are 1+ on the right side, and 1+ on the left side.  Pulmonary/Chest: Effort normal. She has wheezes.  Abdominal: Soft. There is no tenderness.  Musculoskeletal: She exhibits edema.       Lumbar back: She exhibits decreased range of motion and pain. She exhibits no tenderness, no bony tenderness, no swelling and no edema.       Back: Electric w/c for mobility Chronic BLE dermatosclerosis, trace edema   Lymphadenopathy:       Head (right side): No tonsillar, no preauricular, no posterior auricular and no occipital adenopathy present.       Head (left side): No tonsillar, no preauricular, no posterior auricular and no occipital adenopathy present.    She has no cervical adenopathy.       Right: No supraclavicular adenopathy present.       Left: No supraclavicular adenopathy present.  Both lower legs with substantial swelling and skin changes consistent with the chronic swelling.  Neurological: She is alert and oriented to person, place, and  time. No sensory  deficit.  Skin: Skin is warm, dry and intact. No cyanosis or erythema. Nails show no clubbing.  s/p partial mastectomy.    Psychiatric: She has a normal mood and affect. Her speech is normal and behavior is normal.    Labs reviewed: Basic Metabolic Panel:  Recent Labs  02/29/16 06/20/16  NA 141  --   K 3.7  --   BUN 22*  --   CREATININE 0.9  --   TSH 0.04* 0.92   Liver Function Tests:  Recent Labs  02/29/16  AST 13  ALT 11  ALKPHOS 80   No results for input(s): LIPASE, AMYLASE in the last 8760 hours. No results for input(s): AMMONIA in the last 8760 hours. CBC:  Recent Labs  02/29/16  WBC 5.6  HGB 12.4  HCT 37  PLT 170   Lipid Panel: No results for input(s): CHOL, HDL, LDLCALC, TRIG, CHOLHDL, LDLDIRECT in the last 8760 hours. Lab Results  Component Value Date   HGBA1C 6.2 06/23/2015    Procedures since last visit: No results found.  Assessment/Plan  Neck pain on left side Left neck pain x 2 weeks, denied trauma or injury, pain palpated and with ROM of neck, CT cervical spine 03/17/16 showed degenerative changes. Will obtain X-ray cervical spine to evaluate further. Tylenol prn available to her, she said its effective.   Cough Worsened since last night, non productive, afebrile, no O2 desaturation, obtain CXR, continue Tessalon, Zyrtec, Flonase, Advair HFA, Mucinex.   Essential hypertension Controlled, continue Amlodipine Valsartan 5/160mg  daily. HCTZ 12.5mg  daily. Torsemide 20mg   Hypothyroidism Continue Levothyroxine 130mcg qd, update CBC, CMP, UA C/S, Hgb a1c, TSH  Acid reflux Stable, continue Omeprazole.   Anxiety and depression Mood is stable, continue Celexa 10mg  qd and Alprazolam 0.125mg  bid prn. Observe the patient.    Labs/tests ordered: CBC, CMP, UA C/S, Hgb a1c, TSH, X-ray cervical spine, CXR  Next appt: as needed    `

## 2016-10-06 ENCOUNTER — Other Ambulatory Visit: Payer: Self-pay | Admitting: *Deleted

## 2016-10-10 DIAGNOSIS — M25512 Pain in left shoulder: Secondary | ICD-10-CM | POA: Diagnosis not present

## 2016-10-10 DIAGNOSIS — R2681 Unsteadiness on feet: Secondary | ICD-10-CM | POA: Diagnosis not present

## 2016-10-10 DIAGNOSIS — M6281 Muscle weakness (generalized): Secondary | ICD-10-CM | POA: Diagnosis not present

## 2016-10-10 DIAGNOSIS — M545 Low back pain: Secondary | ICD-10-CM | POA: Diagnosis not present

## 2016-10-10 DIAGNOSIS — M25552 Pain in left hip: Secondary | ICD-10-CM | POA: Diagnosis not present

## 2016-10-11 ENCOUNTER — Encounter: Payer: Self-pay | Admitting: *Deleted

## 2016-10-12 ENCOUNTER — Telehealth: Payer: Self-pay

## 2016-10-12 NOTE — Telephone Encounter (Signed)
Patient spoke with [facility] regarding this phone note.

## 2016-10-12 NOTE — Telephone Encounter (Signed)
Patient called c/o anxiety associated with taking Bactrim. Patient was prescribed Bactrim for a UTI, patient has 3 days left.  Patient is not sure she should continue Bactrim for she has had 2 severe episodes of anxiety the last 2 times she took Bactrim. Patient took alprazolam for anxiety episodes.  Patient would like to know what [facility] recommends, please advise

## 2016-10-23 ENCOUNTER — Other Ambulatory Visit: Payer: Self-pay | Admitting: Nurse Practitioner

## 2016-10-25 ENCOUNTER — Other Ambulatory Visit: Payer: Self-pay

## 2016-10-25 DIAGNOSIS — N390 Urinary tract infection, site not specified: Secondary | ICD-10-CM

## 2016-10-26 ENCOUNTER — Other Ambulatory Visit: Payer: Medicare Other

## 2016-10-27 LAB — URINALYSIS
Bilirubin Urine: NEGATIVE
Glucose, UA: NEGATIVE
HGB URINE DIPSTICK: NEGATIVE
Ketones, ur: NEGATIVE
Nitrite: POSITIVE — AB
PROTEIN: NEGATIVE
Specific Gravity, Urine: 1.018 (ref 1.001–1.035)
pH: 6 (ref 5.0–8.0)

## 2016-11-01 ENCOUNTER — Telehealth: Payer: Self-pay

## 2016-11-01 NOTE — Telephone Encounter (Signed)
Tiffany with [facility] called on patient's behalf to inquire about urinalysis collected 10/25/16. Patient was concerned that she has not heard anything.  I reviewed results, results not addressed. Message forwarded to [facility] and her assistant Elijah Birk

## 2016-11-02 ENCOUNTER — Emergency Department (HOSPITAL_COMMUNITY)
Admission: EM | Admit: 2016-11-02 | Discharge: 2016-11-03 | Disposition: A | Discharge status: Home / Self Care | Payer: Medicare Other | Attending: Emergency Medicine | Admitting: Emergency Medicine

## 2016-11-02 ENCOUNTER — Encounter: Payer: Self-pay | Admitting: Nurse Practitioner

## 2016-11-02 ENCOUNTER — Encounter (HOSPITAL_COMMUNITY): Payer: Self-pay | Admitting: Emergency Medicine

## 2016-11-02 DIAGNOSIS — Z853 Personal history of malignant neoplasm of breast: Secondary | ICD-10-CM | POA: Insufficient documentation

## 2016-11-02 DIAGNOSIS — E039 Hypothyroidism, unspecified: Secondary | ICD-10-CM | POA: Insufficient documentation

## 2016-11-02 DIAGNOSIS — Z7982 Long term (current) use of aspirin: Secondary | ICD-10-CM | POA: Diagnosis not present

## 2016-11-02 DIAGNOSIS — I129 Hypertensive chronic kidney disease with stage 1 through stage 4 chronic kidney disease, or unspecified chronic kidney disease: Secondary | ICD-10-CM | POA: Insufficient documentation

## 2016-11-02 DIAGNOSIS — N184 Chronic kidney disease, stage 4 (severe): Secondary | ICD-10-CM | POA: Insufficient documentation

## 2016-11-02 DIAGNOSIS — Z96653 Presence of artificial knee joint, bilateral: Secondary | ICD-10-CM | POA: Diagnosis not present

## 2016-11-02 DIAGNOSIS — R300 Dysuria: Secondary | ICD-10-CM | POA: Diagnosis not present

## 2016-11-02 DIAGNOSIS — R404 Transient alteration of awareness: Secondary | ICD-10-CM | POA: Diagnosis not present

## 2016-11-02 DIAGNOSIS — N390 Urinary tract infection, site not specified: Secondary | ICD-10-CM | POA: Diagnosis not present

## 2016-11-02 DIAGNOSIS — Z79899 Other long term (current) drug therapy: Secondary | ICD-10-CM | POA: Insufficient documentation

## 2016-11-02 DIAGNOSIS — R531 Weakness: Secondary | ICD-10-CM | POA: Diagnosis present

## 2016-11-02 LAB — CBC
HEMATOCRIT: 39.9 % (ref 36.0–46.0)
Hemoglobin: 13 g/dL (ref 12.0–15.0)
MCH: 29.5 pg (ref 26.0–34.0)
MCHC: 32.6 g/dL (ref 30.0–36.0)
MCV: 90.5 fL (ref 78.0–100.0)
Platelets: 143 10*3/uL — ABNORMAL LOW (ref 150–400)
RBC: 4.41 MIL/uL (ref 3.87–5.11)
RDW: 14.6 % (ref 11.5–15.5)
WBC: 7.2 10*3/uL (ref 4.0–10.5)

## 2016-11-02 LAB — BASIC METABOLIC PANEL
Anion gap: 6 (ref 5–15)
BUN: 16 mg/dL (ref 6–20)
CHLORIDE: 101 mmol/L (ref 101–111)
CO2: 33 mmol/L — AB (ref 22–32)
CREATININE: 0.94 mg/dL (ref 0.44–1.00)
Calcium: 7.6 mg/dL — ABNORMAL LOW (ref 8.9–10.3)
GFR calc non Af Amer: 54 mL/min — ABNORMAL LOW (ref >60)
Glucose, Bld: 108 mg/dL — ABNORMAL HIGH (ref 65–99)
Potassium: 3.8 mmol/L (ref 3.5–5.1)
Sodium: 140 mmol/L (ref 135–145)

## 2016-11-02 LAB — URINALYSIS, ROUTINE W REFLEX MICROSCOPIC
BILIRUBIN URINE: NEGATIVE
Glucose, UA: NEGATIVE mg/dL
Hgb urine dipstick: NEGATIVE
KETONES UR: NEGATIVE mg/dL
Nitrite: NEGATIVE
PROTEIN: NEGATIVE mg/dL
SPECIFIC GRAVITY, URINE: 1.008 (ref 1.005–1.030)
pH: 5 (ref 5.0–8.0)

## 2016-11-02 LAB — CBG MONITORING, ED: GLUCOSE-CAPILLARY: 103 mg/dL — AB (ref 65–99)

## 2016-11-02 MED ORDER — NITROFURANTOIN MONOHYD MACRO 100 MG PO CAPS: 100.0000 mg | ORAL_CAPSULE | Freq: Two times a day (BID) | ORAL | 0 refills | Status: DC

## 2016-11-02 MED ORDER — SODIUM CHLORIDE 0.9 % IV SOLN
INTRAVENOUS | Status: DC
  Administered 2016-11-02: 22:00:00 via INTRAVENOUS

## 2016-11-02 MED ORDER — DEXTROSE 5 % IV SOLN
1.0000 g | INTRAVENOUS | Status: DC
  Administered 2016-11-02: 1 g via INTRAVENOUS
  Filled 2016-11-02: qty 10

## 2016-11-02 MED ORDER — SODIUM CHLORIDE 0.9 % IV BOLUS (SEPSIS)
1000.0000 mL | Freq: Once | INTRAVENOUS | Status: AC
  Administered 2016-11-02: 1000 mL via INTRAVENOUS

## 2016-11-02 MED ORDER — DIPHENHYDRAMINE HCL 50 MG/ML IJ SOLN
12.5000 mg | Freq: Once | INTRAMUSCULAR | Status: AC
  Administered 2016-11-02: 12.5 mg via INTRAVENOUS
  Filled 2016-11-02: qty 1

## 2016-11-02 NOTE — ED Notes (Signed)
Pt is reporting trouble breathing, states "I react to a lot of things".  Sats in mid/upper 90s which is pt's norm on 2L [place].  Airway patent.  No wheezes or rales.  Breath sounds clear bilat.  A&Ox4.  No rash visible.  Md aware.

## 2016-11-02 NOTE — ED Notes (Signed)
Unable to collect labs MD is in the room talking with patient

## 2016-11-02 NOTE — ED Notes (Signed)
Bed: GA48 Expected date:  Expected time:  Means of arrival:  Comments: EMS-forgot

## 2016-11-02 NOTE — ED Notes (Signed)
Pt states "I feel so much better now".  Lung sounds clear bilat.

## 2016-11-02 NOTE — ED Notes (Signed)
Unable to get IV sticks in L hand.  R arm is restricted.

## 2016-11-02 NOTE — ED Provider Notes (Signed)
Early DEPT Provider Note   CSN: [id number] Arrival date & time: 11/02/16  1908     History   Chief Complaint Chief Complaint  Patient presents with  . Weakness  . Dizziness    HPI Tamara Morales is a 83 y.o. female.  83 year old female presents with acute onset of dizziness and weakness 1 day. States that she has felt this when the past when she's had a UTI. Endorses some dysuria as well as frequency. Denies any flank pain, fever, vomiting. No chest pain or shortness of breath. Denies any headaches. No recent changes to her medications. States her dizziness is not vertiginous and is worse when she tries to stand. Called EMS was transported here      Past Medical History:  Diagnosis Date  . ARTHRITIS, KNEES, BILATERAL   . Breast CA ([place]) 2000   s/p R lumpectomy and XRT  . DEPRESSION   . DJD (degenerative joint disease) of knee   . GERD   . HYPERLIPIDEMIA   . HYPERTENSION   . HYPOTHYROIDISM    postsurgical  . Hypoxia   . MIGRAINE HEADACHE   . Morbid obesity ([place])   . OSA (obstructive sleep apnea) 01/17/2011 dx  . Oxygen dependent   . Urge incontinence   . UTI (lower urinary tract infection) 12/2014    Patient Active Problem List   Diagnosis Date Noted  . Neck pain on left side 10/05/2016  . Chronic left shoulder pain 08/10/2016  . Hematuria 08/10/2016  . Acute bronchitis 06/29/2016  . Fat necrosis of breast 05/17/2016  . Numbness and tingling of right leg 04/06/2016  . Contusion of right knee 04/06/2016  . Contusion of right breast 04/06/2016  . Redness of left eye 04/06/2016  . IBS (irritable bowel syndrome) 11/18/2015  . Hypokalemia 07/07/2015  . Acid reflux 07/05/2015  . Lobar pneumonia ([place]) 07/02/2015  . Lymphedema of both lower extremities 07/02/2015  . UTI (urinary tract infection) 07/02/2015  . Encephalopathy   . Ureterolithiasis   . Ureteral stone with hydronephrosis 06/28/2015  . Acute renal failure superimposed on stage 4 chronic  kidney disease ([place]) 06/27/2015  . Right ureteral calculus 06/27/2015  . Anxiety and depression 06/27/2015  . History of breast cancer 06/27/2015  . Abdominal pain, RLQ (right lower quadrant) 06/27/2015  . AKI (acute kidney injury) ([place])   . Obesity hypoventilation syndrome ([place]) 11/27/2014  . Cough 03/05/2010  . Morbid obesity ([place]) 10/04/2009  . Hypothyroidism 08/05/2009  . Essential hypertension 08/05/2009    Past Surgical History:  Procedure Laterality Date  . ABDOMINAL HYSTERECTOMY    . ADENOIDECTOMY    . BREAST BIOPSY  2000  . BREAST LUMPECTOMY  2000  . CATARACT EXTRACTION    . CHOLECYSTECTOMY  1995  . CYSTOSCOPY/URETEROSCOPY/HOLMIUM LASER Right 06/28/2015   Procedure: CYSTOSCOPY RIGHT RETROGRAD RIGHT URETEROSCOPY/HOLMIUM LASER WITH RIGHT STENT PLACEMENT;  Surgeon: Alexis Frock, MD;  Location: [facility];  Service: Urology;  Laterality: Right;  . PARATHYROIDECTOMY     2-3 removed  . REPLACEMENT TOTAL KNEE BILATERAL  1999, 2005  . Right leg femur repaired    . THYROIDECTOMY    . TONSILLECTOMY    . TUBAL LIGATION      OB History    Gravida Para Term Preterm AB Living   5 5           SAB TAB Ectopic Multiple Live Births  Home Medications    Prior to Admission medications   Medication Sig Start Date End Date Taking? Authorizing Provider  clotrimazole (LOTRIMIN) 1 % cream Apply 1 application topically 2 (two) times daily. As needed for fungal skin infections 04/02/15  Yes Marin Olp, MD  diphenoxylate-atropine (LOMOTIL) 2.5-0.025 MG tablet TAKE 1 TABLET FOUR TIMES DAILY AS NEEDED FOR DIARRHEA OR LOOSE STOOLS. 05/22/16  Yes Lauree Chandler, NP  FIBER PO Take 2 tablets by mouth every morning.    Yes [provider]  fluticasone (FLONASE) 50 MCG/ACT nasal spray Place 2 sprays into both nostrils daily.   Yes [provider]  fluticasone-salmeterol (ADVAIR HFA) 45-21 MCG/ACT inhaler Inhale 1 puff into the lungs 2 (two) times daily.    Yes [provider]  guaiFENesin (MUCINEX) 600 MG 12 hr tablet Take 600 mg by mouth 2 (two) times daily as needed for cough or to loosen phlegm.    Yes [provider]  hydrochlorothiazide (HYDRODIURIL) 12.5 MG tablet Take 1 tablet (12.5 mg total) by mouth daily. 07/12/16  Yes Estill Dooms, MD  isometheptene-acetaminophen-dichloralphenazone (MIDRIN) [phone] MG capsule Take 1 capsule by mouth 4 (four) times daily as needed for migraine. Maximum 5 capsules in 12 hours for migraine headaches, 8 capsules in 24 hours for tension headaches.   Yes [provider]  levothyroxine (SYNTHROID, LEVOTHROID) 150 MCG tablet Take 1 tablet (150 mcg total) by mouth daily. 03/06/16  Yes Mast, Man X, NP  nystatin (MYCOSTATIN/NYSTOP) powder Apply 1 g topically 2 (two) times daily.   Yes [provider]  omeprazole (PRILOSEC) 20 MG capsule TAKE (1) CAPSULE DAILY. 06/20/16  Yes Mast, Man X, NP  potassium chloride (MICRO-K) 10 MEQ CR capsule TAKE 4 CAPSULES DAILY FOR POTASSIUM 09/11/16  Yes Mast, Man X, NP  torsemide (DEMADEX) 20 MG tablet Take 20 mg by mouth daily.   Yes [provider]  acetaminophen (TYLENOL) 325 MG tablet Take 650 mg by mouth every 8 (eight) hours as needed for mild pain or moderate pain.     [provider]  ALPRAZolam Duanne Moron) 0.25 MG tablet Take 1/2 tablet by mouth twice daily as needed for anxiety Patient not taking: Reported on 11/02/2016 06/05/16   Reed, Tiffany L, DO  amLODipine-valsartan (EXFORGE) 5-160 MG tablet TAKE 1 TABLET ONCE DAILY. 10/23/16   Estill Dooms, MD  aspirin 81 MG tablet Take 81 mg by mouth daily.     [provider]    Family History Family History  Problem Relation Age of Onset  . Emphysema Sister   . Coronary artery disease Neg Hx     Social History Social History  Substance Use Topics  . Smoking status: Former Smoker    Packs/day: 0.25    Years: 2.00    Types: Cigarettes    Quit date: 06/19/1974  .  Smokeless tobacco: Never Used     Comment: Quit in late 20's  . Alcohol use No     Comment: rare     Allergies   Azithromycin; Beta adrenergic blockers; Ciprofloxacin; Codeine; Epinephrine; Klonopin [clonazepam]; Oxycodone; Paxil [paroxetine hydrochloride]; Prednisone; Propoxyphene hcl; Tramadol; Vicodin [hydrocodone-acetaminophen]; Meloxicam; and Vancomycin   Review of Systems Review of Systems  All other systems reviewed and are negative.    Physical Exam Updated Vital Signs BP 105/63 (BP Location: Right Arm)   Pulse 78   Temp 97.6 F (36.4 C) (Oral)   Resp 14   Ht $R'5\' 6"'lA$  (1.676 m)   Wt 300  lb (136.1 kg)   SpO2 94%   BMI 48.42 kg/m   Physical Exam  Constitutional: She is oriented to person, place, and time. She appears well-developed and well-nourished.  Non-toxic appearance. No distress.  HENT:  Head: Normocephalic and atraumatic.  Eyes: Conjunctivae, EOM and lids are normal. Pupils are equal, round, and reactive to light.  Neck: Normal range of motion. Neck supple. No tracheal deviation present. No thyroid mass present.  Cardiovascular: Normal rate, regular rhythm and normal heart sounds.  Exam reveals no gallop.   No murmur heard. Pulmonary/Chest: Effort normal and breath sounds normal. No stridor. No respiratory distress. She has no decreased breath sounds. She has no wheezes. She has no rhonchi. She has no rales.  Abdominal: Soft. Normal appearance and bowel sounds are normal. She exhibits no distension. There is no tenderness. There is no rebound and no CVA tenderness.  Musculoskeletal: Normal range of motion. She exhibits no edema or tenderness.  Neurological: She is alert and oriented to person, place, and time. She has normal strength. No cranial nerve deficit or sensory deficit. GCS eye subscore is 4. GCS verbal subscore is 5. GCS motor subscore is 6.  Skin: Skin is warm and dry. No abrasion and no rash noted.  Psychiatric: She has a normal mood and affect. Her  speech is normal and behavior is normal.  Nursing note and vitals reviewed.    ED Treatments / Results  Labs (all labs ordered are listed, but only abnormal results are displayed) Labs Reviewed  URINALYSIS, ROUTINE W REFLEX MICROSCOPIC - Abnormal; Notable for the following:       Result Value   APPearance HAZY (*)    Leukocytes, UA LARGE (*)    Bacteria, UA FEW (*)    Squamous Epithelial / LPF 6-30 (*)    All other components within normal limits  CBG MONITORING, ED - Abnormal; Notable for the following:    Glucose-Capillary 103 (*)    All other components within normal limits  BASIC METABOLIC PANEL  CBC    EKG  EKG Interpretation None       Radiology No results found.  Procedures Procedures (including critical care time)  Medications Ordered in ED Medications  0.9 %  sodium chloride infusion (not administered)  sodium chloride 0.9 % bolus 1,000 mL (not administered)  cefTRIAXone (ROCEPHIN) 1 g in dextrose 5 % 50 mL IVPB (not administered)     Initial Impression / Assessment and Plan / ED Course  I have reviewed the triage vital signs and the nursing notes.  Pertinent labs & imaging results that were available during my care of the patient were reviewed by me and considered in my medical decision making (see chart for details).     Patient with evidence of UTI here. Will IV hydrate as well as give dose of Rocephin and reevaluate.  11:21 PM Patient with possible allergic reaction to Rocephin. Had no rash or pruritus but did feel some nasal congestion. Will treat her UTI with a quinolone and will follow-up with her doctor.  Final Clinical Impressions(s) / ED Diagnoses   Final diagnoses:  None    New Prescriptions New Prescriptions   No medications on file     Lacretia Leigh, MD 11/02/16 2322

## 2016-11-02 NOTE — ED Triage Notes (Addendum)
Pt from [facility] via EMS for dizziness & weakness reported all day. No falls or LOC, no N/V/D.  Normal fluid intake.  Neg for Ortho VS.  A&Ox4.  Normally ambulatory w/walker but can't today d/t dizziness.  2L [place] home O2. HR: 92 bpm, 118/78, 95%

## 2016-11-03 ENCOUNTER — Ambulatory Visit: Payer: Self-pay | Admitting: Pulmonary Disease

## 2016-11-03 NOTE — Telephone Encounter (Signed)
The patient was contacted 11/02/16 regarding c/o dysuria and f/u UA result of 10/25/16. She was asymptomatic 10/25/16 when UA collected. Observation of s/s of UTI was initiated.  UA C/S reordered 11/02/16 for c/o dysuria.

## 2016-11-18 ENCOUNTER — Ambulatory Visit: Payer: Medicare Other | Admitting: Physician Assistant

## 2016-11-20 ENCOUNTER — Other Ambulatory Visit: Payer: Self-pay | Admitting: Internal Medicine

## 2016-11-26 DIAGNOSIS — R600 Localized edema: Secondary | ICD-10-CM | POA: Diagnosis not present

## 2016-11-26 DIAGNOSIS — Z853 Personal history of malignant neoplasm of breast: Secondary | ICD-10-CM | POA: Diagnosis not present

## 2016-11-26 DIAGNOSIS — I10 Essential (primary) hypertension: Secondary | ICD-10-CM | POA: Diagnosis not present

## 2016-11-26 DIAGNOSIS — R531 Weakness: Secondary | ICD-10-CM | POA: Diagnosis not present

## 2016-11-26 DIAGNOSIS — E669 Obesity, unspecified: Secondary | ICD-10-CM | POA: Diagnosis not present

## 2016-11-26 DIAGNOSIS — Z888 Allergy status to other drugs, medicaments and biological substances status: Secondary | ICD-10-CM | POA: Diagnosis not present

## 2016-11-26 DIAGNOSIS — Z885 Allergy status to narcotic agent status: Secondary | ICD-10-CM | POA: Diagnosis not present

## 2016-11-26 DIAGNOSIS — K219 Gastro-esophageal reflux disease without esophagitis: Secondary | ICD-10-CM | POA: Diagnosis not present

## 2016-11-26 DIAGNOSIS — R110 Nausea: Secondary | ICD-10-CM | POA: Diagnosis not present

## 2016-11-26 DIAGNOSIS — Z883 Allergy status to other anti-infective agents status: Secondary | ICD-10-CM | POA: Diagnosis not present

## 2016-11-26 DIAGNOSIS — R404 Transient alteration of awareness: Secondary | ICD-10-CM | POA: Diagnosis not present

## 2016-11-26 DIAGNOSIS — R5381 Other malaise: Secondary | ICD-10-CM | POA: Diagnosis not present

## 2016-11-26 DIAGNOSIS — N390 Urinary tract infection, site not specified: Secondary | ICD-10-CM | POA: Diagnosis not present

## 2016-11-26 DIAGNOSIS — B9689 Other specified bacterial agents as the cause of diseases classified elsewhere: Secondary | ICD-10-CM | POA: Diagnosis not present

## 2016-11-26 LAB — CBC AND DIFFERENTIAL
HCT: 39 (ref 36–46)
HEMOGLOBIN: 12.7 (ref 12.0–16.0)
Platelets: 179 (ref 150–399)
WBC: 6.9

## 2016-11-26 LAB — POCT INR: INR: 0.9 (ref <=1.1)

## 2016-11-26 LAB — HEPATIC FUNCTION PANEL
ALK PHOS: 99 (ref 25–125)
ALT: 12 (ref 7–35)
AST: 14 (ref 13–35)
Bilirubin, Total: 0.2

## 2016-11-26 LAB — BASIC METABOLIC PANEL
BUN: 22 — AB (ref 4–21)
CREATININE: 0.8 (ref 0.5–1.1)
Glucose: 119
POTASSIUM: 4 (ref 3.4–5.3)
Sodium: 138 (ref 137–147)

## 2016-11-26 LAB — PROTIME-INR: PROTIME: 9.6 — AB (ref 10.0–13.8)

## 2016-11-28 ENCOUNTER — Telehealth: Payer: Self-pay | Admitting: *Deleted

## 2016-11-28 NOTE — Telephone Encounter (Signed)
Spoke with patient regarding a f/u appointment with [facility] for positive UTI. Informed patient that Manxie would see her at 2:00 in the clinic.

## 2016-11-30 ENCOUNTER — Encounter: Payer: Medicare Other | Admitting: Nurse Practitioner

## 2016-12-01 DIAGNOSIS — R2681 Unsteadiness on feet: Secondary | ICD-10-CM | POA: Diagnosis not present

## 2016-12-01 DIAGNOSIS — M545 Low back pain: Secondary | ICD-10-CM | POA: Diagnosis not present

## 2016-12-01 DIAGNOSIS — M25512 Pain in left shoulder: Secondary | ICD-10-CM | POA: Diagnosis not present

## 2016-12-01 DIAGNOSIS — R41841 Cognitive communication deficit: Secondary | ICD-10-CM | POA: Diagnosis not present

## 2016-12-01 DIAGNOSIS — M25552 Pain in left hip: Secondary | ICD-10-CM | POA: Diagnosis not present

## 2016-12-06 ENCOUNTER — Encounter: Payer: Self-pay | Admitting: *Deleted

## 2016-12-07 ENCOUNTER — Non-Acute Institutional Stay: Payer: Medicare Other | Admitting: Nurse Practitioner

## 2016-12-07 ENCOUNTER — Encounter: Payer: Self-pay | Admitting: Nurse Practitioner

## 2016-12-07 DIAGNOSIS — F419 Anxiety disorder, unspecified: Secondary | ICD-10-CM | POA: Diagnosis not present

## 2016-12-07 DIAGNOSIS — M542 Cervicalgia: Secondary | ICD-10-CM

## 2016-12-07 DIAGNOSIS — F32A Depression, unspecified: Secondary | ICD-10-CM

## 2016-12-07 DIAGNOSIS — E039 Hypothyroidism, unspecified: Secondary | ICD-10-CM

## 2016-12-07 DIAGNOSIS — R635 Abnormal weight gain: Secondary | ICD-10-CM | POA: Diagnosis not present

## 2016-12-07 DIAGNOSIS — F329 Major depressive disorder, single episode, unspecified: Secondary | ICD-10-CM | POA: Diagnosis not present

## 2016-12-07 DIAGNOSIS — I10 Essential (primary) hypertension: Secondary | ICD-10-CM | POA: Diagnosis not present

## 2016-12-07 DIAGNOSIS — K219 Gastro-esophageal reflux disease without esophagitis: Secondary | ICD-10-CM

## 2016-12-07 NOTE — Assessment & Plan Note (Signed)
Stable, continue Omeprazole.  

## 2016-12-07 NOTE — Assessment & Plan Note (Signed)
10/05/16 X-ray chest, cervical spine: degenerative changes, CXR no acute pathology.  10/06/16 Na 142, K 4.6, Bun 18, creat 0.79, Hgb a1c 6.0, TSH 0.43, wbc 6.4, Hgb 13.6, plt 167 On and off, livable.

## 2016-12-07 NOTE — Assessment & Plan Note (Signed)
Gained #12Ibs over 2 months, stopped taking Torsemide, but no apparent increased swelling in legs or respiratory symptoms. Encourage diet management of her weight, increase HCT to $Remo'25mg'tJHgM$  po qd. Update CBC CMP TSH prior to the next appointment per pt's request.

## 2016-12-07 NOTE — Assessment & Plan Note (Signed)
The patient stopped taking Torsemide, but she has no significant change of BLE edema, on HCT 12.$RemoveB'5mg'BSwOrrKg$  for swelling in legs. Denied change of her diet, paroxysmal nocturnal orthopnea, sputum production, chest pain, or palpitation. Will increase HCT $RemoveBefo'25mg'MTLyxBrhXsF$  po qd, update CBC CMP TSH prior to the next appointment in 3 months. Observe.

## 2016-12-07 NOTE — Assessment & Plan Note (Signed)
Continue Levothyroxine 133mcg qd, update TSH, last TSH 0.43 10/06/16

## 2016-12-07 NOTE — Assessment & Plan Note (Signed)
Mood is stable, continue Celexa $RemoveBeforeD'10mg'DDeZLbOesBqAYk$  qd and Alprazolam 0.$RemoveBeforeDE'125mg'cPgVeBYIJpfxPSX$  bid prn. Observe the patient.

## 2016-12-07 NOTE — Progress Notes (Signed)
Patient ID: Tamara Morales, female   DOB: 04/16/1931, 83 y.o.   MRN: [id number]   Location:   clinic [place]   Place of Service: clinic [place]  Provider: Marlana Latus NP  Code Status: DNR  Goals of Care: [place]  Advanced Directives 12/07/2016  Does Patient Have a Medical Advance Directive? Yes  Type of Paramedic of [place];Living will  Does patient want to make changes to medical advance directive? No - Patient declined  Copy of [place] in Chart? Yes  Would patient like information on creating a medical advance directive? -  Pre-existing out of facility DNR order (yellow form or pink MOST form) -     Chief Complaint  Patient presents with  . Hospitalization Follow-up    HPI: Patient is a 83 y.o. female seen today for ED eval for UTI, completed Keflex, she is asymptomatic of UTI.    Hx of kidney stone, off Flomax. Takes HCTZ 12.5mg  for  chronic BLE edema, taking Kcl 17meq daily. HTN controlled on Amlodipine/Valsarta 5/160mg  daily. Depression/anxiety, mood is stable, sleeps well while on Alprazolam prn bid and Celexa 10mg .  Hypothyroidism is well supplemented while on Synthroid 156mcg since 03/06/16 TSH 0.92 06/20/16.  Asymptomatic GERD while on Omeprazole 20mg  qd. Left neck pain, on and off,CT cervical spine 03/17/16     Past Medical History:  Diagnosis Date  . ARTHRITIS, KNEES, BILATERAL   . Breast CA ([place]) 2000   s/p R lumpectomy and XRT  . DEPRESSION   . DJD (degenerative joint disease) of knee   . GERD   . HYPERLIPIDEMIA   . HYPERTENSION   . HYPOTHYROIDISM    postsurgical  . Hypoxia   . MIGRAINE HEADACHE   . Morbid obesity ([place])   . OSA (obstructive sleep apnea) 01/17/2011 dx  . Oxygen dependent   . Urge incontinence   . UTI (lower urinary tract infection) 12/2014    Past Surgical History:  Procedure Laterality Date  . ABDOMINAL HYSTERECTOMY    . ADENOIDECTOMY    . BREAST BIOPSY  2000  . BREAST LUMPECTOMY  2000  . CATARACT  EXTRACTION    . CHOLECYSTECTOMY  1995  . CYSTOSCOPY/URETEROSCOPY/HOLMIUM LASER Right 06/28/2015   Procedure: CYSTOSCOPY RIGHT RETROGRAD RIGHT URETEROSCOPY/HOLMIUM LASER WITH RIGHT STENT PLACEMENT;  Surgeon: Alexis Frock, MD;  Location: [facility];  Service: Urology;  Laterality: Right;  . PARATHYROIDECTOMY     2-3 removed  . REPLACEMENT TOTAL KNEE BILATERAL  1999, 2005  . Right leg femur repaired    . THYROIDECTOMY    . TONSILLECTOMY    . TUBAL LIGATION      Allergies  Allergen Reactions  . Azithromycin Itching  . Beta Adrenergic Blockers     Depression   . Ciprofloxacin Other (See Comments)    REACTION: edgy and very jumpy   . Codeine Nausea And Vomiting  . Epinephrine Other (See Comments)    REACTION: rapid pulse, sweats  . Klonopin [Clonazepam] Other (See Comments)    Makes her feel very suicidal   . Oxycodone Other (See Comments)    Patient felt like it altered her mental status "Crazy" . Hallucinations later as well.  [name] Paxil [Paroxetine Hydrochloride] Other (See Comments)    Sever depression   . Prednisone     "makes me feel really bad"  . Propoxyphene Hcl Nausea And Vomiting  . Tramadol Other (See Comments)    Feels "jittery"  . Vicodin [Hydrocodone-Acetaminophen] Other (See Comments)  Hallucinations  . Meloxicam Diarrhea  . Vancomycin Rash    Localized rash related to infusion rate.    Allergies as of 12/07/2016      Reactions   Azithromycin Itching   Beta Adrenergic Blockers    Depression   Ciprofloxacin Other (See Comments)   REACTION: edgy and very jumpy    Codeine Nausea And Vomiting   Epinephrine Other (See Comments)   REACTION: rapid pulse, sweats   Klonopin [clonazepam] Other (See Comments)   Makes her feel very suicidal    Oxycodone Other (See Comments)   Patient felt like it altered her mental status "Crazy" . Hallucinations later as well.   Paxil [paroxetine Hydrochloride] Other (See Comments)   Sever depression    Prednisone    "makes me feel  really bad"   Propoxyphene Hcl Nausea And Vomiting   Tramadol Other (See Comments)   Feels "jittery"   Vicodin [hydrocodone-acetaminophen] Other (See Comments)   Hallucinations   Meloxicam Diarrhea   Vancomycin Rash   Localized rash related to infusion rate.      Medication List       Accurate as of 12/07/16  3:57 PM. Always use your most recent med list.          acetaminophen 325 MG tablet Commonly known as:  TYLENOL Take 650 mg by mouth every 8 (eight) hours as needed for mild pain or moderate pain.   ALPRAZolam 0.25 MG tablet Commonly known as:  XANAX TAKE 1/2 TABLET TWICE DAILY AS NEEDED.   amLODipine-valsartan 5-160 MG tablet Commonly known as:  EXFORGE TAKE 1 TABLET ONCE DAILY.   aspirin 81 MG tablet Take 81 mg by mouth daily.   clotrimazole 1 % cream Commonly known as:  LOTRIMIN Apply 1 application topically 2 (two) times daily. As needed for fungal skin infections   diphenoxylate-atropine 2.5-0.025 MG tablet Commonly known as:  LOMOTIL TAKE 1 TABLET FOUR TIMES DAILY AS NEEDED FOR DIARRHEA OR LOOSE STOOLS.   FIBER PO Take 1 tablet by mouth every morning.   fluticasone-salmeterol 45-21 MCG/ACT inhaler Commonly known as:  ADVAIR HFA Inhale 1 puff into the lungs 2 (two) times daily.   hydrochlorothiazide 12.5 MG tablet Commonly known as:  HYDRODIURIL Take 1 tablet (12.5 mg total) by mouth daily.   isometheptene-acetaminophen-dichloralphenazone 65-100-325 MG capsule Commonly known as:  MIDRIN Take 1 capsule by mouth 4 (four) times daily as needed for migraine. Maximum 5 capsules in 12 hours for migraine headaches, 8 capsules in 24 hours for tension headaches.   levothyroxine 150 MCG tablet Commonly known as:  SYNTHROID, LEVOTHROID Take 1 tablet (150 mcg total) by mouth daily.   nystatin powder Commonly known as:  MYCOSTATIN/NYSTOP Apply 1 g topically 2 (two) times daily.   omeprazole 20 MG capsule Commonly known as:  PRILOSEC TAKE (1) CAPSULE  DAILY.   potassium chloride 10 MEQ CR capsule Commonly known as:  MICRO-K TAKE 4 CAPSULES DAILY FOR POTASSIUM       Review of Systems:  Review of Systems  Constitutional:       Obese  HENT: Negative for ear pain, hearing loss, sore throat and tinnitus.   Eyes: Negative for photophobia and pain.       Left eye  Respiratory: Positive for cough. Negative for shortness of breath.   Cardiovascular: Positive for leg swelling. Negative for chest pain and palpitations.       Chronic BLE, dermatosclerosis changes  Gastrointestinal: Negative for abdominal pain, blood in stool, constipation, diarrhea x1  last night, nausea and vomiting.  Genitourinary: Positive for frequency. Negative for dysuria, flank pain, hematuria and urgency.       The right   Musculoskeletal: Positive for back pain. Negative for myalgias. left neck pain x 2weeks. Skin: Negative for rash.   Neurological: Negative for dizziness, weakness and headaches.  Psychiatric/Behavioral: The patient is nervous/anxious.     Health Maintenance  Topic Date Due  . DEXA SCAN  09/07/1997  . INFLUENZA VACCINE  01/17/2017  . TETANUS/TDAP  10/05/2019  . PNA vac Low Risk Adult  Completed    Physical Exam: Vitals:   12/07/16 1439  BP: (!) 142/62  Pulse: 88  Resp: (!) 22  Temp: 97.5 F (36.4 C)  Weight: (!) 312 lb 3.2 oz (141.6 kg)  Height: $Remove'5\' 6"'duknLuU$  (1.676 m)   Body mass index is 50.39 kg/m. Physical Exam  Constitutional: She is oriented to person, place, and time. She appears well-developed and well-nourished. She is active and cooperative. No distress.  BP 144/81 mmHg  Pulse 87  Temp(Src) 98 F (36.7 C) (Oral)  Resp 17  SpO2 95%  HENT:  Head: Normocephalic and atraumatic.  Right Ear: Hearing and external ear normal.  Left Ear: Hearing and external ear normal.  Nose: Nose normal.  Mouth/Throat: Oropharynx is clear and moist.  Eyes: Conjunctivae are normal. No scleral icterus.   Neck: Normal range of motion. Neck  supple. No thyromegaly present. left neck pain palpated Cardiovascular: Normal rate, regular rhythm, normal heart sounds and intact distal pulses.   Pulses:      Radial pulses are 2+ on the right side, and 2+ on the left side.       Dorsalis pedis pulses are 1+ on the right side, and 1+ on the left side.  Pulmonary/Chest: Effort normal. She has wheezes.  Abdominal: Soft. There is no tenderness.  Musculoskeletal: She exhibits edema.       Lumbar back: She exhibits decreased range of motion and pain. She exhibits no tenderness, no bony tenderness, no swelling and no edema.       Back: Electric w/c for mobility Chronic BLE dermatosclerosis, trace edema   Lymphadenopathy:       Head (right side): No tonsillar, no preauricular, no posterior auricular and no occipital adenopathy present.       Head (left side): No tonsillar, no preauricular, no posterior auricular and no occipital adenopathy present.    She has no cervical adenopathy.       Right: No supraclavicular adenopathy present.       Left: No supraclavicular adenopathy present.  Both lower legs with substantial swelling and skin changes consistent with the chronic swelling.  Neurological: She is alert and oriented to person, place, and time. No sensory deficit.  Skin: Skin is warm, dry and intact. No cyanosis or erythema. Nails show no clubbing.  s/p partial mastectomy.    Psychiatric: She has a normal mood and affect. Her speech is normal and behavior is normal.    Labs reviewed: Basic Metabolic Panel:  Recent Labs  02/29/16 06/20/16 10/05/16 11/02/16 2043 11/26/16  NA 141  --  142 140 138  K 3.7  --  4.6 3.8 4.0  CL  --   --   --  101  --   CO2  --   --   --  33*  --   GLUCOSE  --   --   --  108*  --   BUN 22*  --  18 16  22*  CREATININE 0.9  --  0.8 0.94 0.8  CALCIUM  --   --   --  7.6*  --   TSH 0.04* 0.92 0.43  --   --    Liver Function Tests:  Recent Labs  02/29/16 10/05/16 11/26/16  AST 13 20 14   ALT 11 14 12     ALKPHOS 80 88 99   No results for input(s): LIPASE, AMYLASE in the last 8760 hours. No results for input(s): AMMONIA in the last 8760 hours. CBC:  Recent Labs  10/05/16 11/02/16 2043 11/26/16  WBC 6.4 7.2 6.9  HGB 13.6 13.0 12.7  HCT 41 39.9 39  MCV  --  90.5  --   PLT 167 143* 179   Lipid Panel: No results for input(s): CHOL, HDL, LDLCALC, TRIG, CHOLHDL, LDLDIRECT in the last 8760 hours. Lab Results  Component Value Date   HGBA1C 6.0 10/05/2016    Procedures since last visit: No results found.  Assessment/Plan  Weight gain The patient stopped taking Torsemide, but she has no significant change of BLE edema, on HCT 12.5mg  for swelling in legs. Denied change of her diet, paroxysmal nocturnal orthopnea, sputum production, chest pain, or palpitation. Will increase HCT 25mg  po qd, update CBC CMP TSH prior to the next appointment in 3 months. Observe.   Essential hypertension Controlled, continue Amlodipine Valsartan 5/160mg  daily. HCTZ 25mg  daily.   Acid reflux Stable, continue Omeprazole.   Hypothyroidism Continue Levothyroxine 160mcg qd, update TSH, last TSH 0.43 10/06/16  Morbid obesity ([place]) Gained #12Ibs over 2 months, stopped taking Torsemide, but no apparent increased swelling in legs or respiratory symptoms. Encourage diet management of her weight, increase HCT to 25mg  po qd. Update CBC CMP TSH prior to the next appointment per pt's request.   Anxiety and depression Mood is stable, continue Celexa 10mg  qd and Alprazolam 0.125mg  bid prn. Observe the patient.   Neck pain on left side 10/05/16 X-ray chest, cervical spine: degenerative changes, CXR no acute pathology.  10/06/16 Na 142, K 4.6, Bun 18, creat 0.79, Hgb a1c 6.0, TSH 0.43, wbc 6.4, Hgb 13.6, plt 167 On and off, livable.    Labs/tests ordered: CBC CMP TSH prior to the next appointment.   Next appt:   3 months.     `

## 2016-12-07 NOTE — Patient Instructions (Signed)
Please arrange CBC CMP TSH prior to the next appointment.

## 2016-12-07 NOTE — Assessment & Plan Note (Signed)
Controlled, continue Amlodipine Valsartan 5/160mg  daily. HCTZ 25mg  daily.

## 2016-12-12 DIAGNOSIS — R2681 Unsteadiness on feet: Secondary | ICD-10-CM | POA: Diagnosis not present

## 2016-12-12 DIAGNOSIS — M25512 Pain in left shoulder: Secondary | ICD-10-CM | POA: Diagnosis not present

## 2016-12-12 DIAGNOSIS — M545 Low back pain: Secondary | ICD-10-CM | POA: Diagnosis not present

## 2016-12-12 DIAGNOSIS — M25552 Pain in left hip: Secondary | ICD-10-CM | POA: Diagnosis not present

## 2016-12-12 DIAGNOSIS — R41841 Cognitive communication deficit: Secondary | ICD-10-CM | POA: Diagnosis not present

## 2016-12-13 ENCOUNTER — Other Ambulatory Visit: Payer: Self-pay | Admitting: Nurse Practitioner

## 2016-12-13 DIAGNOSIS — I10 Essential (primary) hypertension: Secondary | ICD-10-CM

## 2016-12-14 ENCOUNTER — Other Ambulatory Visit: Payer: Self-pay | Admitting: Nurse Practitioner

## 2016-12-14 DIAGNOSIS — K219 Gastro-esophageal reflux disease without esophagitis: Secondary | ICD-10-CM

## 2016-12-21 ENCOUNTER — Ambulatory Visit: Payer: Self-pay | Admitting: Nurse Practitioner

## 2016-12-22 ENCOUNTER — Ambulatory Visit (INDEPENDENT_AMBULATORY_CARE_PROVIDER_SITE_OTHER): Payer: Medicare Other | Admitting: Nurse Practitioner

## 2016-12-22 ENCOUNTER — Encounter: Payer: Self-pay | Admitting: Nurse Practitioner

## 2016-12-22 VITALS — BP 128/72 | HR 86 | Temp 98.1°F | Resp 18 | Ht 65.0 in | Wt 314.8 lb

## 2016-12-22 DIAGNOSIS — M545 Low back pain: Secondary | ICD-10-CM | POA: Diagnosis not present

## 2016-12-22 DIAGNOSIS — M25552 Pain in left hip: Secondary | ICD-10-CM | POA: Diagnosis not present

## 2016-12-22 DIAGNOSIS — M25512 Pain in left shoulder: Secondary | ICD-10-CM | POA: Diagnosis not present

## 2016-12-22 DIAGNOSIS — M6281 Muscle weakness (generalized): Secondary | ICD-10-CM | POA: Diagnosis not present

## 2016-12-22 DIAGNOSIS — M542 Cervicalgia: Secondary | ICD-10-CM | POA: Diagnosis not present

## 2016-12-22 DIAGNOSIS — K5900 Constipation, unspecified: Secondary | ICD-10-CM | POA: Diagnosis not present

## 2016-12-22 DIAGNOSIS — R2681 Unsteadiness on feet: Secondary | ICD-10-CM | POA: Diagnosis not present

## 2016-12-22 DIAGNOSIS — N390 Urinary tract infection, site not specified: Secondary | ICD-10-CM

## 2016-12-22 LAB — POCT URINALYSIS DIPSTICK
Bilirubin, UA: NEGATIVE
Blood, UA: NEGATIVE
Glucose, UA: NEGATIVE
KETONES UA: NEGATIVE
LEUKOCYTES UA: NEGATIVE
NITRITE UA: NEGATIVE
PH UA: 7 (ref 5.0–8.0)
PROTEIN UA: NEGATIVE
Spec Grav, UA: 1.025 (ref 1.010–1.025)
UROBILINOGEN UA: NEGATIVE U/dL — AB

## 2016-12-22 MED ORDER — CLOTRIMAZOLE 1 % EX CREA: 1.0000 "application " | TOPICAL_CREAM | Freq: Two times a day (BID) | CUTANEOUS | 1 refills | Status: DC

## 2016-12-22 MED ORDER — NAPROXEN 500 MG PO TABS: 500.0000 mg | ORAL_TABLET | Freq: Two times a day (BID) | ORAL | 0 refills | Status: AC

## 2016-12-22 MED ORDER — TIZANIDINE HCL 2 MG PO CAPS: 2.0000 mg | ORAL_CAPSULE | Freq: Three times a day (TID) | ORAL | 0 refills | Status: DC | PRN

## 2016-12-22 NOTE — Patient Instructions (Signed)
Take naproxen twice daily for 1 week then may use aleve as needed To use zanaflex three times daily as needed for muscle spasms Cont with Occupational therapy

## 2016-12-22 NOTE — Progress Notes (Signed)
Careteam: Patient Care Team: Mast, Man X, NP as PCP - General (Internal Medicine) Josue Hector, MD as Consulting Physician (Cardiology) Azucena Fallen, MD as Consulting Physician (Obstetrics and Gynecology) Chesley Mires, MD as Consulting Physician (Pulmonary Disease)  Advanced Directive information Does Patient Have a Medical Advance Directive?: Yes, Type of Advance Directive: [place];Living will  Allergies  Allergen Reactions  . Azithromycin Itching  . Beta Adrenergic Blockers     Depression   . Ciprofloxacin Other (See Comments)    REACTION: edgy and very jumpy   . Codeine Nausea And Vomiting  . Epinephrine Other (See Comments)    REACTION: rapid pulse, sweats  . Klonopin [Clonazepam] Other (See Comments)    Makes her feel very suicidal   . Oxycodone Other (See Comments)    Patient felt like it altered her mental status "Crazy" . Hallucinations later as well.  [name] Paxil [Paroxetine Hydrochloride] Other (See Comments)    Sever depression   . Prednisone     "makes me feel really bad"  . Propoxyphene Hcl Nausea And Vomiting  . Tramadol Other (See Comments)    Feels "jittery"  . Vicodin [Hydrocodone-Acetaminophen] Other (See Comments)    Hallucinations  . Meloxicam Diarrhea  . Vancomycin Rash    Localized rash related to infusion rate.    Chief Complaint  Patient presents with  . Acute Visit    Pt is being seen due to neck stiffness x 1 month. Pt states there was no injury but she did wake up with neck hurting. Pt states pain runs through neck to back of head, down shoulder, and into right hand where it causes numbness.      HPI: Patient is a 83 y.o. female seen in the office today due to stiff neck.  Pt with hx of UTI, kidney stones, edema, HTN, depression, hypothyroid, GERD and left neck pain. She had CT on 03-17-16 and xray 10/05/16 cervical spine: degenerative changes, CXR no acute pathology.  Feels muscular has been going on a month.  Started in left shoulder went into left side of neck. Will have numbness in hand at times.  Aleve helps. Has been taking 2 tablets and 2 at night for 3 days Now pain is 8-9/10. Has not been able to find a comfortable place Better when she is relaxing in her recliner and not having to support her head.  Sharp pain. Grabbing pain with muscle spasms Had OT but reports that it was "mickey mouse stuff" and was a joke. Not effective. Has appt with another OT today. Did some trigger points with her daughter and this helped for 4 days.  Using heating pad at night which eases the pain.    Pt also with recurrent UTI, completed Keflex last week.  States she believes she has had 4 UTIs since January.  She felt sick but never had burning or increase frequency. She is incontinent of urine.  Only compliant is that she would feel really bad.   Review of Systems:  Review of Systems  Constitutional:       Obese  HENT: Negative for congestion, ear pain, hearing loss, sore throat and tinnitus.   Eyes: Negative for photophobia, pain and redness.  Respiratory: Negative for cough and shortness of breath.   Cardiovascular: Positive for leg swelling. Negative for chest pain and palpitations.       Chronic BLE, dermatosclerosis changes  Gastrointestinal: Negative for abdominal pain, blood in stool, constipation, diarrhea, nausea and vomiting.  Genitourinary: Positive for frequency. Negative for dysuria, flank pain, hematuria and urgency.  Musculoskeletal: Positive for back pain, myalgias and neck pain.  Skin: Negative for rash.  Neurological: Negative for dizziness, weakness and headaches.  Psychiatric/Behavioral: Negative for depression. The patient is not nervous/anxious.     Past Medical History:  Diagnosis Date  . ARTHRITIS, KNEES, BILATERAL   . Breast CA ([place]) 2000   s/p R lumpectomy and XRT  . DEPRESSION   . DJD (degenerative joint disease) of knee   . GERD   . HYPERLIPIDEMIA   . HYPERTENSION     . HYPOTHYROIDISM    postsurgical  . Hypoxia   . MIGRAINE HEADACHE   . Morbid obesity ([place])   . OSA (obstructive sleep apnea) 01/17/2011 dx  . Oxygen dependent   . Urge incontinence   . UTI (lower urinary tract infection) 12/2014   Past Surgical History:  Procedure Laterality Date  . ABDOMINAL HYSTERECTOMY    . ADENOIDECTOMY    . BREAST BIOPSY  2000  . BREAST LUMPECTOMY  2000  . CATARACT EXTRACTION    . CHOLECYSTECTOMY  1995  . CYSTOSCOPY/URETEROSCOPY/HOLMIUM LASER Right 06/28/2015   Procedure: CYSTOSCOPY RIGHT RETROGRAD RIGHT URETEROSCOPY/HOLMIUM LASER WITH RIGHT STENT PLACEMENT;  Surgeon: Alexis Frock, MD;  Location: [facility];  Service: Urology;  Laterality: Right;  . PARATHYROIDECTOMY     2-3 removed  . REPLACEMENT TOTAL KNEE BILATERAL  1999, 2005  . Right leg femur repaired    . THYROIDECTOMY    . TONSILLECTOMY    . TUBAL LIGATION     Social History:   reports that she quit smoking about 42 years ago. Her smoking use included Cigarettes. She has a 0.50 pack-year smoking history. She has never used smokeless tobacco. She reports that she does not drink alcohol or use drugs.  Family History  Problem Relation Age of Onset  . Emphysema Sister   . Coronary artery disease Neg Hx     Medications: Patient's Medications  New Prescriptions   No medications on file  Previous Medications   ACETAMINOPHEN (TYLENOL) 325 MG TABLET    Take 650 mg by mouth every 8 (eight) hours as needed for mild pain or moderate pain.    ALPRAZOLAM (XANAX) 0.25 MG TABLET    TAKE 1/2 TABLET TWICE DAILY AS NEEDED.   AMLODIPINE-VALSARTAN (EXFORGE) 5-160 MG TABLET    TAKE 1 TABLET ONCE DAILY.   ASPIRIN 81 MG TABLET    Take 81 mg by mouth daily.    CLOTRIMAZOLE (LOTRIMIN) 1 % CREAM    Apply 1 application topically 2 (two) times daily. As needed for fungal skin infections   DIPHENOXYLATE-ATROPINE (LOMOTIL) 2.5-0.025 MG TABLET    TAKE 1 TABLET FOUR TIMES DAILY AS NEEDED FOR DIARRHEA OR LOOSE STOOLS.   FIBER  PO    Take 1 tablet by mouth every morning.    FLUTICASONE-SALMETEROL (ADVAIR HFA) 45-21 MCG/ACT INHALER    Inhale 1 puff into the lungs 2 (two) times daily.   HYDROCHLOROTHIAZIDE (HYDRODIURIL) 25 MG TABLET    Take 25 mg by mouth daily.   ISOMETHEPTENE-ACETAMINOPHEN-DICHLORALPHENAZONE (MIDRIN) 65-100-325 MG CAPSULE    Take 1 capsule by mouth 4 (four) times daily as needed for migraine. Maximum 5 capsules in 12 hours for migraine headaches, 8 capsules in 24 hours for tension headaches.   LEVOTHYROXINE (SYNTHROID, LEVOTHROID) 150 MCG TABLET    Take 1 tablet (150 mcg total) by mouth daily.   OMEPRAZOLE (PRILOSEC) 20 MG CAPSULE    TAKE (1)  CAPSULE DAILY.   POTASSIUM CHLORIDE (MICRO-K) 10 MEQ CR CAPSULE    TAKE 4 CAPSULES DAILY FOR POTASSIUM  Modified Medications   No medications on file  Discontinued Medications   HYDROCHLOROTHIAZIDE (HYDRODIURIL) 12.5 MG TABLET    Take 1 tablet (12.5 mg total) by mouth daily.   NYSTATIN (MYCOSTATIN/NYSTOP) POWDER    Apply 1 g topically 2 (two) times daily.     Physical Exam:  Vitals:   12/22/16 1041  BP: 128/72  Pulse: 86  Resp: 18  Temp: 98.1 F (36.7 C)  TempSrc: Oral  SpO2: 96%  Weight: (!) 314 lb 12.8 oz (142.8 kg)  Height: $Remove'5\' 5"'JlEDETq$  (1.651 m)   Body mass index is 52.39 kg/m.  Physical Exam  Constitutional: She is oriented to person, place, and time. She appears well-developed and well-nourished. No distress.  HENT:  Head: Normocephalic and atraumatic.  Mouth/Throat: Oropharynx is clear and moist. No oropharyngeal exudate.  Eyes: Conjunctivae are normal. Pupils are equal, round, and reactive to light.  Neck: Normal range of motion.  Cardiovascular: Normal rate, regular rhythm and normal heart sounds.   Pulmonary/Chest: Effort normal and breath sounds normal.  Musculoskeletal: She exhibits no edema.       Left shoulder: She exhibits tenderness. She exhibits normal range of motion and no swelling.       Cervical back: She exhibits tenderness (to  left paraspinal muscles).  Tenderness noted to left trapezius muscle as well as left paraspinal muscle   Neurological: She is alert and oriented to person, place, and time.  Skin: Skin is warm and dry. She is not diaphoretic.  Psychiatric: She has a normal mood and affect.   Labs reviewed: Basic Metabolic Panel:  Recent Labs  02/29/16 06/20/16 10/05/16 11/02/16 2043 11/26/16  NA 141  --  142 140 138  K 3.7  --  4.6 3.8 4.0  CL  --   --   --  101  --   CO2  --   --   --  33*  --   GLUCOSE  --   --   --  108*  --   BUN 22*  --  18 16 22*  CREATININE 0.9  --  0.8 0.94 0.8  CALCIUM  --   --   --  7.6*  --   TSH 0.04* 0.92 0.43  --   --    Liver Function Tests:  Recent Labs  02/29/16 10/05/16 11/26/16  AST $Re'13 20 14  'lSO$ ALT $R'11 14 12  'zg$ ALKPHOS 80 88 99   No results for input(s): LIPASE, AMYLASE in the last 8760 hours. No results for input(s): AMMONIA in the last 8760 hours. CBC:  Recent Labs  10/05/16 11/02/16 2043 11/26/16  WBC 6.4 7.2 6.9  HGB 13.6 13.0 12.7  HCT 41 39.9 39  MCV  --  90.5  --   PLT 167 143* 179   Lipid Panel: No results for input(s): CHOL, HDL, LDLCALC, TRIG, CHOLHDL, LDLDIRECT in the last 8760 hours. TSH:  Recent Labs  02/29/16 06/20/16 10/05/16  TSH 0.04* 0.92 0.43   A1C: Lab Results  Component Value Date   HGBA1C 6.0 10/05/2016     Assessment/Plan 1. Neck pain on left side -to cont to use heat, 2-3 times daily for 20-30 mins -cont with OT - naproxen (NAPROSYN) 500 MG tablet; Take 1 tablet (500 mg total) by mouth 2 (two) times daily with a meal.  Dispense: 14 tablet; Refill: 0 - tizanidine (ZANAFLEX) 2  MG capsule; Take 1 capsule (2 mg total) by mouth 3 (three) times daily as needed for muscle spasms.  Dispense: 30 capsule; Refill: 0  2. Recurrent UTI -discussed prevention  - POC Urinalysis Dipstick negative  3. Constipation. May use colace daily Increase hydration     Jessica K. Harle Battiest  [facility] [phone] 8 am - 5 pm) [phone] (after hours)

## 2016-12-25 DIAGNOSIS — M6281 Muscle weakness (generalized): Secondary | ICD-10-CM | POA: Diagnosis not present

## 2016-12-25 DIAGNOSIS — M25512 Pain in left shoulder: Secondary | ICD-10-CM | POA: Diagnosis not present

## 2016-12-25 DIAGNOSIS — R2681 Unsteadiness on feet: Secondary | ICD-10-CM | POA: Diagnosis not present

## 2016-12-25 DIAGNOSIS — M25552 Pain in left hip: Secondary | ICD-10-CM | POA: Diagnosis not present

## 2016-12-25 DIAGNOSIS — M545 Low back pain: Secondary | ICD-10-CM | POA: Diagnosis not present

## 2016-12-26 ENCOUNTER — Other Ambulatory Visit: Payer: Self-pay | Admitting: Internal Medicine

## 2016-12-26 ENCOUNTER — Telehealth: Payer: Self-pay

## 2016-12-26 ENCOUNTER — Other Ambulatory Visit: Payer: Self-pay

## 2016-12-26 MED ORDER — HYDROCHLOROTHIAZIDE 12.5 MG PO TABS: 25.0000 mg | ORAL_TABLET | Freq: Every day | ORAL | 3 refills | Status: DC

## 2016-12-26 NOTE — Telephone Encounter (Signed)
Patient is taking 2 tablets daily for total of 25 mg. Pt medication list has been updated. Rx was sent to pharmacy.

## 2016-12-26 NOTE — Telephone Encounter (Signed)
A refill request was received from [facility]. Rx sent to pharmacy electronically.

## 2016-12-26 NOTE — Telephone Encounter (Signed)
A refill request was received from [facility] stating that pt needed a refill of hydrochlorothiazide.  Pt informed pharmacy that she is not taking 2 tablets daily. Pt med list states she takes 1 tablet daily.   Please advise on correct dose.

## 2016-12-27 ENCOUNTER — Other Ambulatory Visit: Payer: Self-pay | Admitting: Nurse Practitioner

## 2016-12-27 DIAGNOSIS — M545 Low back pain: Secondary | ICD-10-CM | POA: Diagnosis not present

## 2016-12-27 DIAGNOSIS — R2681 Unsteadiness on feet: Secondary | ICD-10-CM | POA: Diagnosis not present

## 2016-12-27 DIAGNOSIS — M25552 Pain in left hip: Secondary | ICD-10-CM | POA: Diagnosis not present

## 2016-12-27 DIAGNOSIS — M6281 Muscle weakness (generalized): Secondary | ICD-10-CM | POA: Diagnosis not present

## 2016-12-27 DIAGNOSIS — M25512 Pain in left shoulder: Secondary | ICD-10-CM | POA: Diagnosis not present

## 2016-12-28 DIAGNOSIS — M25512 Pain in left shoulder: Secondary | ICD-10-CM | POA: Diagnosis not present

## 2016-12-28 DIAGNOSIS — M6281 Muscle weakness (generalized): Secondary | ICD-10-CM | POA: Diagnosis not present

## 2016-12-28 DIAGNOSIS — M545 Low back pain: Secondary | ICD-10-CM | POA: Diagnosis not present

## 2016-12-28 DIAGNOSIS — M25552 Pain in left hip: Secondary | ICD-10-CM | POA: Diagnosis not present

## 2016-12-28 DIAGNOSIS — R2681 Unsteadiness on feet: Secondary | ICD-10-CM | POA: Diagnosis not present

## 2017-01-01 DIAGNOSIS — M25552 Pain in left hip: Secondary | ICD-10-CM | POA: Diagnosis not present

## 2017-01-01 DIAGNOSIS — M545 Low back pain: Secondary | ICD-10-CM | POA: Diagnosis not present

## 2017-01-01 DIAGNOSIS — M6281 Muscle weakness (generalized): Secondary | ICD-10-CM | POA: Diagnosis not present

## 2017-01-01 DIAGNOSIS — R2681 Unsteadiness on feet: Secondary | ICD-10-CM | POA: Diagnosis not present

## 2017-01-01 DIAGNOSIS — M25512 Pain in left shoulder: Secondary | ICD-10-CM | POA: Diagnosis not present

## 2017-01-03 DIAGNOSIS — M6281 Muscle weakness (generalized): Secondary | ICD-10-CM | POA: Diagnosis not present

## 2017-01-03 DIAGNOSIS — M25512 Pain in left shoulder: Secondary | ICD-10-CM | POA: Diagnosis not present

## 2017-01-03 DIAGNOSIS — M545 Low back pain: Secondary | ICD-10-CM | POA: Diagnosis not present

## 2017-01-03 DIAGNOSIS — R2681 Unsteadiness on feet: Secondary | ICD-10-CM | POA: Diagnosis not present

## 2017-01-03 DIAGNOSIS — M25552 Pain in left hip: Secondary | ICD-10-CM | POA: Diagnosis not present

## 2017-01-05 DIAGNOSIS — M545 Low back pain: Secondary | ICD-10-CM | POA: Diagnosis not present

## 2017-01-05 DIAGNOSIS — M25512 Pain in left shoulder: Secondary | ICD-10-CM | POA: Diagnosis not present

## 2017-01-05 DIAGNOSIS — M6281 Muscle weakness (generalized): Secondary | ICD-10-CM | POA: Diagnosis not present

## 2017-01-05 DIAGNOSIS — R2681 Unsteadiness on feet: Secondary | ICD-10-CM | POA: Diagnosis not present

## 2017-01-05 DIAGNOSIS — M25552 Pain in left hip: Secondary | ICD-10-CM | POA: Diagnosis not present

## 2017-01-08 DIAGNOSIS — M545 Low back pain: Secondary | ICD-10-CM | POA: Diagnosis not present

## 2017-01-08 DIAGNOSIS — M25512 Pain in left shoulder: Secondary | ICD-10-CM | POA: Diagnosis not present

## 2017-01-08 DIAGNOSIS — M6281 Muscle weakness (generalized): Secondary | ICD-10-CM | POA: Diagnosis not present

## 2017-01-08 DIAGNOSIS — R2681 Unsteadiness on feet: Secondary | ICD-10-CM | POA: Diagnosis not present

## 2017-01-08 DIAGNOSIS — M25552 Pain in left hip: Secondary | ICD-10-CM | POA: Diagnosis not present

## 2017-01-09 ENCOUNTER — Non-Acute Institutional Stay: Payer: Medicare Other | Admitting: Internal Medicine

## 2017-01-09 ENCOUNTER — Encounter: Payer: Self-pay | Admitting: Internal Medicine

## 2017-01-09 VITALS — BP 140/80 | HR 100 | Temp 97.0°F | Resp 20 | Ht 65.0 in | Wt 315.0 lb

## 2017-01-09 DIAGNOSIS — K5900 Constipation, unspecified: Secondary | ICD-10-CM

## 2017-01-09 DIAGNOSIS — F411 Generalized anxiety disorder: Secondary | ICD-10-CM

## 2017-01-09 DIAGNOSIS — R110 Nausea: Secondary | ICD-10-CM

## 2017-01-09 DIAGNOSIS — F430 Acute stress reaction: Secondary | ICD-10-CM | POA: Diagnosis not present

## 2017-01-09 MED ORDER — DIAZEPAM 5 MG PO TABS: 5.0000 mg | ORAL_TABLET | Freq: Two times a day (BID) | ORAL | 0 refills | Status: DC | PRN

## 2017-01-09 MED ORDER — DOCUSATE SODIUM 100 MG PO CAPS: 100.0000 mg | ORAL_CAPSULE | Freq: Two times a day (BID) | ORAL | 0 refills | Status: DC | PRN

## 2017-01-09 MED ORDER — POLYETHYLENE GLYCOL 3350 17 GM/SCOOP PO POWD: 17.0000 g | Freq: Every day | ORAL | 1 refills | Status: DC | PRN

## 2017-01-09 NOTE — Progress Notes (Signed)
[facility]  Provider: Blanchie Serve MD   Location:  [place] [facility] Service:  Clinic (12)  PCP: Mast, Man X, NP Patient Care Team: Mast, Man X, NP as PCP - General (Internal Medicine) Josue Hector, MD as Consulting Physician (Cardiology) Azucena Fallen, MD as Consulting Physician (Obstetrics and Gynecology) Chesley Mires, MD as Consulting Physician (Pulmonary Disease)  Extended Emergency Contact Information Primary Emergency Contact: Chauncey,Amy Address: [address]          [place], [place] [address] Johnnette Litter of [place] Phone: [phone] Relation: Daughter Secondary Emergency Contact: Boxman,Lee Address: [address]          [place], [place] [address] Johnnette Litter of [place] Phone: [phone] Relation: Daughter   Goals of Care: Advanced Directive information Advanced Directives 12/22/2016  Does Patient Have a Medical Advance Directive? Yes  Type of Paramedic of [place];Living will  Does patient want to make changes to medical advance directive? -  Copy of [place] in Chart? Yes  Would patient like information on creating a medical advance directive? -  Pre-existing out of facility DNR order (yellow form or pink MOST form) -      Chief Complaint  Patient presents with  . Acute Visit    anxiety, nausea, constipation    HPI: Patient is a 83 y.o. female seen today for acute visit.  She lost her grandson this Sunday. He shot himself. She has been very anxious since then and feels emotionally disturbed. She has been feeling hot and cold since this am while in dining area. Was nauseous this am but now feels slightly better. She felt wiped out this am. Denies any vomiting. No fever reported. Had soup and crackers last night for meal.     Past Medical History:  Diagnosis Date  . ARTHRITIS, KNEES, BILATERAL   . Breast CA ([place]) 2000   s/p R lumpectomy and XRT    . DEPRESSION   . DJD (degenerative joint disease) of knee   . GERD   . HYPERLIPIDEMIA   . HYPERTENSION   . HYPOTHYROIDISM    postsurgical  . Hypoxia   . MIGRAINE HEADACHE   . Morbid obesity ([place])   . OSA (obstructive sleep apnea) 01/17/2011 dx  . Oxygen dependent   . Urge incontinence   . UTI (lower urinary tract infection) 12/2014   Past Surgical History:  Procedure Laterality Date  . ABDOMINAL HYSTERECTOMY    . ADENOIDECTOMY    . BREAST BIOPSY  2000  . BREAST LUMPECTOMY  2000  . CATARACT EXTRACTION    . CHOLECYSTECTOMY  1995  . CYSTOSCOPY/URETEROSCOPY/HOLMIUM LASER Right 06/28/2015   Procedure: CYSTOSCOPY RIGHT RETROGRAD RIGHT URETEROSCOPY/HOLMIUM LASER WITH RIGHT STENT PLACEMENT;  Surgeon: Alexis Frock, MD;  Location: [facility];  Service: Urology;  Laterality: Right;  . PARATHYROIDECTOMY     2-3 removed  . REPLACEMENT TOTAL KNEE BILATERAL  1999, 2005  . Right leg femur repaired    . THYROIDECTOMY    . TONSILLECTOMY    . TUBAL LIGATION      reports that she quit smoking about 42 years ago. Her smoking use included Cigarettes. She has a 0.50 pack-year smoking history. She has never used smokeless tobacco. She reports that she does not drink alcohol or use drugs. Social History   Social History  . Marital status: Married    Spouse name: N/A  . Number of children:  5  . Years of education: N/A   Occupational History  . RETIRED Retired    worked on office   Social History Main Topics  . Smoking status: Former Smoker    Packs/day: 0.25    Years: 2.00    Types: Cigarettes    Quit date: 06/19/1974  . Smokeless tobacco: Never Used     Comment: Quit in late 20's  . Alcohol use No     Comment: rare  . Drug use: No  . Sexual activity: No   Other Topics Concern  . Not on file   Social History Narrative   Married to husband that has dementia (he lives in the skilled nursing wing) and this is a big stressor. 5 children. 10 grandchildren. 2 greatgrandchildren. All children  [place]      Lives at [facility], in independent living.      Retired from multiple different Community education officer, university, Psychologist, educational.       Hobbies: Management consultant, write poetry, craft dolls     Family History  Problem Relation Age of Onset  . Emphysema Sister   . Coronary artery disease Neg Hx     Health Maintenance  Topic Date Due  . DEXA SCAN  06/19/2017 (Originally 09/07/1997)  . INFLUENZA VACCINE  01/17/2017  . TETANUS/TDAP  10/05/2019  . PNA vac Low Risk Adult  Completed    Allergies  Allergen Reactions  . Azithromycin Itching  . Beta Adrenergic Blockers     Depression   . Ciprofloxacin Other (See Comments)    REACTION: edgy and very jumpy   . Codeine Nausea And Vomiting  . Epinephrine Other (See Comments)    REACTION: rapid pulse, sweats  . Klonopin [Clonazepam] Other (See Comments)    Makes her feel very suicidal   . Oxycodone Other (See Comments)    Patient felt like it altered her mental status "Crazy" . Hallucinations later as well.  [name] Paxil [Paroxetine Hydrochloride] Other (See Comments)    Sever depression   . Prednisone     "makes me feel really bad"  . Propoxyphene Hcl Nausea And Vomiting  . Tramadol Other (See Comments)    Feels "jittery"  . Vicodin [Hydrocodone-Acetaminophen] Other (See Comments)    Hallucinations  . Meloxicam Diarrhea  . Vancomycin Rash    Localized rash related to infusion rate.    Outpatient Encounter Prescriptions as of 01/09/2017  Medication Sig  . amLODipine-valsartan (EXFORGE) 5-160 MG tablet TAKE 1 TABLET ONCE DAILY.  [name] aspirin 81 MG tablet Take 81 mg by mouth daily.   [name] FIBER PO Take 1 tablet by mouth every morning.   . hydrochlorothiazide (HYDRODIURIL) 12.5 MG tablet TAKE 1 TABLET ONCE DAILY.  [name] isometheptene-acetaminophen-dichloralphenazone (MIDRIN) 65-100-325 MG capsule Take 1 capsule by mouth 4 (four) times daily as needed for migraine. Maximum 5 capsules in 12 hours for migraine headaches, 8 capsules in 24  hours for tension headaches.  . levothyroxine (SYNTHROID, LEVOTHROID) 150 MCG tablet Take 1 tablet (150 mcg total) by mouth daily.  [name] omeprazole (PRILOSEC) 20 MG capsule TAKE (1) CAPSULE DAILY.  [name] potassium chloride (MICRO-K) 10 MEQ CR capsule TAKE 4 CAPSULES DAILY FOR POTASSIUM  . acetaminophen (TYLENOL) 325 MG tablet Take 650 mg by mouth every 8 (eight) hours as needed for mild pain or moderate pain.   . clotrimazole (LOTRIMIN) 1 % cream Apply 1 application topically 2 (two) times daily. As needed for fungal skin infections (Patient not taking: Reported on 01/09/2017)  .  diazepam (VALIUM) 5 MG tablet Take 1 tablet (5 mg total) by mouth every 12 (twelve) hours as needed for anxiety.  . diphenoxylate-atropine (LOMOTIL) 2.5-0.025 MG tablet TAKE 1 TABLET FOUR TIMES DAILY AS NEEDED FOR DIARRHEA OR LOOSE STOOLS. (Patient not taking: Reported on 01/09/2017)  . docusate sodium (COLACE) 100 MG capsule Take 1 capsule (100 mg total) by mouth 2 (two) times daily as needed for mild constipation or moderate constipation.  . fluticasone-salmeterol (ADVAIR HFA) 45-21 MCG/ACT inhaler Inhale 1 puff into the lungs 2 (two) times daily.  . polyethylene glycol powder (GLYCOLAX/MIRALAX) powder Take 17 g by mouth daily as needed for moderate constipation.  . tizanidine (ZANAFLEX) 2 MG capsule Take 1 capsule (2 mg total) by mouth 3 (three) times daily as needed for muscle spasms. (Patient not taking: Reported on 01/09/2017)  . [DISCONTINUED] ALPRAZolam (XANAX) 0.25 MG tablet TAKE 1/2 TABLET TWICE DAILY AS NEEDED. (Patient not taking: Reported on 01/09/2017)   No facility-administered encounter medications on file as of 01/09/2017.     Review of Systems  Constitutional: Positive for chills. Negative for diaphoresis, fatigue and fever.  HENT: Positive for congestion. Negative for ear pain, rhinorrhea, sore throat and trouble swallowing.        Chronic nasal congestion  Eyes: Negative for pain and visual disturbance.    Respiratory: Negative for cough, chest tightness and shortness of breath.        Uses CPAP at night time  Cardiovascular: Negative for chest pain and palpitations.       Chronic lymphedema to both legs  Gastrointestinal: Negative for abdominal pain, diarrhea, nausea and vomiting.  Genitourinary: Negative for dysuria.  Musculoskeletal: Positive for arthralgias, back pain and gait problem.       On motorized wheelchair  Skin: Negative for wound.  Neurological: Positive for weakness. Negative for dizziness, syncope, light-headedness, numbness and headaches.       Chronic leg weakness  Psychiatric/Behavioral: Positive for dysphoric mood. The patient is nervous/anxious.     Vitals:   01/09/17 0902  BP: 140/80  Pulse: 100  Resp: 20  Temp: (!) 97 F (36.1 C)  TempSrc: Oral  SpO2: 97%  Weight: (!) 315 lb (142.9 kg)  Height: 5\' 5"  (1.651 m)   Body mass index is 52.42 kg/m. Physical Exam  Constitutional: She is oriented to person, place, and time. No distress.  Morbidly obese female  HENT:  Head: Normocephalic and atraumatic.  Mouth/Throat: Oropharynx is clear and moist.  Eyes: Pupils are equal, round, and reactive to light. Conjunctivae are normal.  Neck: Normal range of motion. Neck supple. No thyromegaly present.  Cardiovascular: Normal rate and regular rhythm.   Pulmonary/Chest: Effort normal and breath sounds normal.  Abdominal: Soft. Bowel sounds are normal. She exhibits distension. There is no tenderness. There is no rebound and no guarding.  Musculoskeletal: She exhibits edema.  On motorized wheelchair, chronic lymphedema to her legs, able to move all 4 extremities  Lymphadenopathy:    She has no cervical adenopathy.  Neurological: She is alert and oriented to person, place, and time.  Skin: Skin is warm and dry. She is not diaphoretic.  Psychiatric:  Anxious, tearful this visit    Labs reviewed: Basic Metabolic Panel:  Recent Labs  10/05/16 11/02/16 2043  11/26/16  NA 142 140 138  K 4.6 3.8 4.0  CL  --  101  --   CO2  --  33*  --   GLUCOSE  --  108*  --   BUN  18 16 22*  CREATININE 0.8 0.94 0.8  CALCIUM  --  7.6*  --    Liver Function Tests:  Recent Labs  02/29/16 10/05/16 11/26/16  AST $Re'13 20 14  'LEi$ ALT $R'11 14 12  'XQ$ ALKPHOS 80 88 99   No results for input(s): LIPASE, AMYLASE in the last 8760 hours. No results for input(s): AMMONIA in the last 8760 hours. CBC:  Recent Labs  10/05/16 11/02/16 2043 11/26/16  WBC 6.4 7.2 6.9  HGB 13.6 13.0 12.7  HCT 41 39.9 39  MCV  --  90.5  --   PLT 167 143* 179   Cardiac Enzymes: No results for input(s): CKTOTAL, CKMB, CKMBINDEX, TROPONINI in the last 8760 hours. BNP: Invalid input(s): POCBNP Lab Results  Component Value Date   HGBA1C 6.0 10/05/2016   Lab Results  Component Value Date   TSH 0.43 10/05/2016   No results found for: VITAMINB12 No results found for: FOLATE No results found for: IRON, TIBC, FERRITIN  Lipid Panel: No results for input(s): CHOL, HDL, LDLCALC, TRIG, CHOLHDL, LDLDIRECT in the last 8760 hours. Lab Results  Component Value Date   HGBA1C 6.0 10/05/2016    Procedures since last visit: No results found.  Assessment/Plan  1. Nausea Resolved at present. Had nausea this am. No vomiting and abdominal pain. Her constipation and anxiety likely caused it. Monitor clinically for now   2. Constipation, unspecified constipation type Start miralax daily as needed with colace 100 mg bid prn, encouraged hydration and to have her meals. Monitor for loose stool with hx if IBS  3. Anxiety as acute reaction to gross stress Lost her grandson 2 days back. She is on prn alprazolam, d/c this and start diazepam 5 mg bid prn for anxiety for now. Per pt, she has had panic attacks in past and valium has helped. Reassess in 1 week.    Labs/tests ordered:  none  Next appointment: 1 week  Communication: reviewed care plan with pt.    Blanchie Serve, MD Internal  Medicine [facility] [address] [place], [place] [address] Cell Phone (Monday-Friday 8 am - 5 pm): [phone] On Call: [phone] and follow prompts after 5 pm and on weekends Office Phone: [phone] Office Fax: [phone]

## 2017-01-10 DIAGNOSIS — M545 Low back pain: Secondary | ICD-10-CM | POA: Diagnosis not present

## 2017-01-10 DIAGNOSIS — M25552 Pain in left hip: Secondary | ICD-10-CM | POA: Diagnosis not present

## 2017-01-10 DIAGNOSIS — R2681 Unsteadiness on feet: Secondary | ICD-10-CM | POA: Diagnosis not present

## 2017-01-10 DIAGNOSIS — M25512 Pain in left shoulder: Secondary | ICD-10-CM | POA: Diagnosis not present

## 2017-01-10 DIAGNOSIS — M6281 Muscle weakness (generalized): Secondary | ICD-10-CM | POA: Diagnosis not present

## 2017-01-10 DIAGNOSIS — N390 Urinary tract infection, site not specified: Secondary | ICD-10-CM | POA: Diagnosis not present

## 2017-01-12 ENCOUNTER — Emergency Department (HOSPITAL_COMMUNITY): Payer: Medicare Other

## 2017-01-12 ENCOUNTER — Emergency Department (HOSPITAL_COMMUNITY)
Admission: EM | Admit: 2017-01-12 | Discharge: 2017-01-12 | Disposition: A | Discharge status: Home / Self Care | Payer: Medicare Other | Attending: Emergency Medicine | Admitting: Emergency Medicine

## 2017-01-12 DIAGNOSIS — J111 Influenza due to unidentified influenza virus with other respiratory manifestations: Secondary | ICD-10-CM | POA: Diagnosis not present

## 2017-01-12 DIAGNOSIS — Z87440 Personal history of urinary (tract) infections: Secondary | ICD-10-CM | POA: Insufficient documentation

## 2017-01-12 DIAGNOSIS — R5381 Other malaise: Secondary | ICD-10-CM | POA: Diagnosis not present

## 2017-01-12 DIAGNOSIS — Z87891 Personal history of nicotine dependence: Secondary | ICD-10-CM | POA: Diagnosis not present

## 2017-01-12 DIAGNOSIS — R110 Nausea: Secondary | ICD-10-CM | POA: Diagnosis not present

## 2017-01-12 DIAGNOSIS — M6281 Muscle weakness (generalized): Secondary | ICD-10-CM | POA: Diagnosis not present

## 2017-01-12 DIAGNOSIS — M25512 Pain in left shoulder: Secondary | ICD-10-CM | POA: Diagnosis not present

## 2017-01-12 DIAGNOSIS — R2681 Unsteadiness on feet: Secondary | ICD-10-CM | POA: Diagnosis not present

## 2017-01-12 DIAGNOSIS — N390 Urinary tract infection, site not specified: Secondary | ICD-10-CM | POA: Diagnosis not present

## 2017-01-12 DIAGNOSIS — R05 Cough: Secondary | ICD-10-CM | POA: Diagnosis not present

## 2017-01-12 DIAGNOSIS — Z79899 Other long term (current) drug therapy: Secondary | ICD-10-CM | POA: Diagnosis not present

## 2017-01-12 DIAGNOSIS — R0600 Dyspnea, unspecified: Secondary | ICD-10-CM | POA: Insufficient documentation

## 2017-01-12 DIAGNOSIS — M25552 Pain in left hip: Secondary | ICD-10-CM | POA: Diagnosis not present

## 2017-01-12 DIAGNOSIS — I10 Essential (primary) hypertension: Secondary | ICD-10-CM | POA: Insufficient documentation

## 2017-01-12 DIAGNOSIS — M791 Myalgia: Secondary | ICD-10-CM | POA: Insufficient documentation

## 2017-01-12 DIAGNOSIS — R030 Elevated blood-pressure reading, without diagnosis of hypertension: Secondary | ICD-10-CM | POA: Diagnosis not present

## 2017-01-12 DIAGNOSIS — R6883 Chills (without fever): Secondary | ICD-10-CM | POA: Diagnosis not present

## 2017-01-12 DIAGNOSIS — E669 Obesity, unspecified: Secondary | ICD-10-CM | POA: Diagnosis not present

## 2017-01-12 DIAGNOSIS — R69 Illness, unspecified: Secondary | ICD-10-CM

## 2017-01-12 DIAGNOSIS — M545 Low back pain: Secondary | ICD-10-CM | POA: Diagnosis not present

## 2017-01-12 LAB — CBC WITH DIFFERENTIAL/PLATELET
BASOS ABS: 0.1 10*3/uL (ref 0.0–0.1)
Basophils Relative: 1 %
Eosinophils Absolute: 0.2 10*3/uL (ref 0.0–0.7)
Eosinophils Relative: 3 %
HEMATOCRIT: 38.5 % (ref 36.0–46.0)
HEMOGLOBIN: 12.8 g/dL (ref 12.0–15.0)
Lymphocytes Relative: 20 %
Lymphs Abs: 1.2 10*3/uL (ref 0.7–4.0)
MCH: 29.6 pg (ref 26.0–34.0)
MCHC: 33.2 g/dL (ref 30.0–36.0)
MCV: 89.1 fL (ref 78.0–100.0)
Monocytes Absolute: 0.5 10*3/uL (ref 0.1–1.0)
Monocytes Relative: 8 %
NEUTROS ABS: 4.2 10*3/uL (ref 1.7–7.7)
Neutrophils Relative %: 68 %
Platelets: 158 10*3/uL (ref 150–400)
RBC: 4.32 MIL/uL (ref 3.87–5.11)
RDW: 14.5 % (ref 11.5–15.5)
WBC: 6.1 10*3/uL (ref 4.0–10.5)

## 2017-01-12 LAB — COMPREHENSIVE METABOLIC PANEL
ALBUMIN: 3.4 g/dL — AB (ref 3.5–5.0)
ALT: 13 U/L — ABNORMAL LOW (ref 14–54)
AST: 15 U/L (ref 15–41)
Alkaline Phosphatase: 73 U/L (ref 38–126)
Anion gap: 7 (ref 5–15)
BILIRUBIN TOTAL: 0.7 mg/dL (ref 0.3–1.2)
BUN: 14 mg/dL (ref 6–20)
CHLORIDE: 103 mmol/L (ref 101–111)
CO2: 29 mmol/L (ref 22–32)
Calcium: 8.6 mg/dL — ABNORMAL LOW (ref 8.9–10.3)
Creatinine, Ser: 0.93 mg/dL (ref 0.44–1.00)
GFR, EST NON AFRICAN AMERICAN: 55 mL/min — AB (ref >60)
Glucose, Bld: 107 mg/dL — ABNORMAL HIGH (ref 65–99)
POTASSIUM: 4 mmol/L (ref 3.5–5.1)
SODIUM: 139 mmol/L (ref 135–145)
TOTAL PROTEIN: 6.5 g/dL (ref 6.5–8.1)

## 2017-01-12 LAB — URINALYSIS, ROUTINE W REFLEX MICROSCOPIC
Bilirubin Urine: NEGATIVE
GLUCOSE, UA: NEGATIVE mg/dL
Hgb urine dipstick: NEGATIVE
Ketones, ur: NEGATIVE mg/dL
Leukocytes, UA: NEGATIVE
Nitrite: NEGATIVE
PH: 6 (ref 5.0–8.0)
Protein, ur: NEGATIVE mg/dL
SPECIFIC GRAVITY, URINE: 1.015 (ref 1.005–1.030)

## 2017-01-12 LAB — I-STAT CG4 LACTIC ACID, ED: LACTIC ACID, VENOUS: 0.68 mmol/L (ref 0.5–1.9)

## 2017-01-12 MED ORDER — ONDANSETRON HCL 4 MG PO TABS: 4.0000 mg | ORAL_TABLET | Freq: Four times a day (QID) | ORAL | 0 refills | Status: DC | PRN

## 2017-01-12 NOTE — Discharge Instructions (Signed)
Continue taking your antibiotic until the bottle is empty. Drink plenty of fluids. Take acetaminophen or ibuprofen as needed for fever or aching. Return if symptoms get worse.

## 2017-01-12 NOTE — ED Notes (Signed)
[facility] called for transport.  

## 2017-01-12 NOTE — ED Notes (Signed)
Pt reports she doses not feel well having generalized malaise denies out right pain.

## 2017-01-12 NOTE — ED Triage Notes (Signed)
Pt from [facility] transported via EMS for UTI that is not resolving fast enough after 4 days of taking bactrim DS.

## 2017-01-12 NOTE — ED Notes (Signed)
Bed: CW88 Expected date:  Expected time:  Means of arrival:  Comments: 83 yo

## 2017-01-12 NOTE — ED Provider Notes (Signed)
[facility] DEPT Provider Note   CSN: [id number] Arrival date & time: 01/12/17  0152     History   Chief Complaint Chief Complaint  Patient presents with  . Recurrent UTI    HPI Tamara Morales is a 83 y.o. female.  The history is provided by the patient.  She states that she feels sick. She was seen several days ago at an urgent care center and diagnosed with urinary tract infection and started on trimethoprim-sulfamethoxazole. She states that she develops nausea with that, but has been taking it. Tonight, she developed chills and started feeling sick all over. She is complaining of some generalized bodyaches. She denies fever or sweats. She is also feeling dyspneic with a cough which is nonproductive. She denies dysuria. She has urinary frequency and urgency all the time because of being on a diuretic. She is on home oxygen.  Past Medical History:  Diagnosis Date  . ARTHRITIS, KNEES, BILATERAL   . Breast CA ([place]) 2000   s/p R lumpectomy and XRT  . DEPRESSION   . DJD (degenerative joint disease) of knee   . GERD   . HYPERLIPIDEMIA   . HYPERTENSION   . HYPOTHYROIDISM    postsurgical  . Hypoxia   . MIGRAINE HEADACHE   . Morbid obesity ([place])   . OSA (obstructive sleep apnea) 01/17/2011 dx  . Oxygen dependent   . Urge incontinence   . UTI (lower urinary tract infection) 12/2014    Patient Active Problem List   Diagnosis Date Noted  . Weight gain 12/07/2016  . Neck pain on left side 10/05/2016  . Chronic left shoulder pain 08/10/2016  . Hematuria 08/10/2016  . Acute bronchitis 06/29/2016  . Fat necrosis of breast 05/17/2016  . Numbness and tingling of right leg 04/06/2016  . Contusion of right knee 04/06/2016  . Contusion of right breast 04/06/2016  . Redness of left eye 04/06/2016  . IBS (irritable bowel syndrome) 11/18/2015  . Hypokalemia 07/07/2015  . Acid reflux 07/05/2015  . Lobar pneumonia ([place]) 07/02/2015  . Lymphedema of both lower extremities  07/02/2015  . UTI (urinary tract infection) 07/02/2015  . Encephalopathy   . Ureterolithiasis   . Ureteral stone with hydronephrosis 06/28/2015  . Acute renal failure superimposed on stage 4 chronic kidney disease ([place]) 06/27/2015  . Right ureteral calculus 06/27/2015  . Anxiety and depression 06/27/2015  . History of breast cancer 06/27/2015  . Abdominal pain, RLQ (right lower quadrant) 06/27/2015  . AKI (acute kidney injury) ([place])   . Obesity hypoventilation syndrome ([place]) 11/27/2014  . Cough 03/05/2010  . Morbid obesity ([place]) 10/04/2009  . Hypothyroidism 08/05/2009  . Essential hypertension 08/05/2009    Past Surgical History:  Procedure Laterality Date  . ABDOMINAL HYSTERECTOMY    . ADENOIDECTOMY    . BREAST BIOPSY  2000  . BREAST LUMPECTOMY  2000  . CATARACT EXTRACTION    . CHOLECYSTECTOMY  1995  . CYSTOSCOPY/URETEROSCOPY/HOLMIUM LASER Right 06/28/2015   Procedure: CYSTOSCOPY RIGHT RETROGRAD RIGHT URETEROSCOPY/HOLMIUM LASER WITH RIGHT STENT PLACEMENT;  Surgeon: Alexis Frock, MD;  Location: [facility];  Service: Urology;  Laterality: Right;  . PARATHYROIDECTOMY     2-3 removed  . REPLACEMENT TOTAL KNEE BILATERAL  1999, 2005  . Right leg femur repaired    . THYROIDECTOMY    . TONSILLECTOMY    . TUBAL LIGATION      OB History    Gravida Para Term Preterm AB Living   5 5  SAB TAB Ectopic Multiple Live Births                   Home Medications    Prior to Admission medications   Medication Sig Start Date End Date Taking? Authorizing Provider  acetaminophen (TYLENOL) 325 MG tablet Take 650 mg by mouth every 8 (eight) hours as needed for mild pain or moderate pain.     [provider]  amLODipine-valsartan (EXFORGE) 5-160 MG tablet TAKE 1 TABLET ONCE DAILY. 10/23/16   Estill Dooms, MD  aspirin 81 MG tablet Take 81 mg by mouth daily.     [provider]  clotrimazole (LOTRIMIN) 1 % cream Apply 1 application topically 2 (two) times daily. As  needed for fungal skin infections Patient not taking: Reported on 01/09/2017 12/22/16   Lauree Chandler, NP  diazepam (VALIUM) 5 MG tablet Take 1 tablet (5 mg total) by mouth every 12 (twelve) hours as needed for anxiety. 01/09/17   Blanchie Serve, MD  diphenoxylate-atropine (LOMOTIL) 2.5-0.025 MG tablet TAKE 1 TABLET FOUR TIMES DAILY AS NEEDED FOR DIARRHEA OR LOOSE STOOLS. Patient not taking: Reported on 01/09/2017 05/22/16   Lauree Chandler, NP  docusate sodium (COLACE) 100 MG capsule Take 1 capsule (100 mg total) by mouth 2 (two) times daily as needed for mild constipation or moderate constipation. 01/09/17   Blanchie Serve, MD  FIBER PO Take 1 tablet by mouth every morning.     [provider]  fluticasone-salmeterol (ADVAIR HFA) 45-21 MCG/ACT inhaler Inhale 1 puff into the lungs 2 (two) times daily.    [provider]  hydrochlorothiazide (HYDRODIURIL) 12.5 MG tablet TAKE 1 TABLET ONCE DAILY. 12/26/16   Mast, Man X, NP  isometheptene-acetaminophen-dichloralphenazone (MIDRIN) 65-100-325 MG capsule Take 1 capsule by mouth 4 (four) times daily as needed for migraine. Maximum 5 capsules in 12 hours for migraine headaches, 8 capsules in 24 hours for tension headaches.    [provider]  levothyroxine (SYNTHROID, LEVOTHROID) 150 MCG tablet Take 1 tablet (150 mcg total) by mouth daily. 03/06/16   Mast, Man X, NP  omeprazole (PRILOSEC) 20 MG capsule TAKE (1) CAPSULE DAILY. 12/14/16   Mast, Man X, NP  polyethylene glycol powder (GLYCOLAX/MIRALAX) powder Take 17 g by mouth daily as needed for moderate constipation. 01/09/17   Blanchie Serve, MD  potassium chloride (MICRO-K) 10 MEQ CR capsule TAKE 4 CAPSULES DAILY FOR POTASSIUM 12/13/16   Mast, Man X, NP  tizanidine (ZANAFLEX) 2 MG capsule Take 1 capsule (2 mg total) by mouth 3 (three) times daily as needed for muscle spasms. Patient not taking: Reported on 01/09/2017 12/22/16   Lauree Chandler, NP    Family History Family History    Problem Relation Age of Onset  . Emphysema Sister   . Coronary artery disease Neg Hx     Social History Social History  Substance Use Topics  . Smoking status: Former Smoker    Packs/day: 0.25    Years: 2.00    Types: Cigarettes    Quit date: 06/19/1974  . Smokeless tobacco: Never Used     Comment: Quit in late 20's  . Alcohol use No     Comment: rare     Allergies   Azithromycin; Beta adrenergic blockers; Ciprofloxacin; Codeine; Epinephrine; Klonopin [clonazepam]; Oxycodone; Paxil [paroxetine hydrochloride]; Prednisone; Propoxyphene hcl; Tramadol; Vicodin [hydrocodone-acetaminophen]; Meloxicam; and Vancomycin   Review of Systems Review of Systems  All other systems reviewed and are negative.    Physical Exam Updated  Vital Signs BP 131/68 (BP Location: Left Arm)   Pulse 80   Temp 98.1 F (36.7 C) (Oral)   Resp 18   SpO2 95%   Physical Exam  Nursing note and vitals reviewed.  83 year old female, resting comfortably and in no acute distress. Vital signs are normal. Oxygen saturation is 95%, which is normal. Head is normocephalic and atraumatic. PERRLA, EOMI. Oropharynx is clear. Neck is nontender and supple without adenopathy or JVD. Back is nontender and there is no CVA tenderness. Lungs have rales at the right base. There are no wheezes or rhonchi. Chest is nontender. Heart has regular rate and rhythm without murmur. Abdomen is soft, flat, nontender without masses or hepatosplenomegaly and peristalsis is normoactive. Extremities have 2+ lymphedema with mild erythema, full range of motion is present. Skin is warm and dry without rash. Neurologic: Mental status is normal, cranial nerves are intact, there are no motor or sensory deficits.  ED Treatments / Results  Labs (all labs ordered are listed, but only abnormal results are displayed) Labs Reviewed  COMPREHENSIVE METABOLIC PANEL - Abnormal; Notable for the following:       Result Value   Glucose, Bld 107  (*)    Calcium 8.6 (*)    Albumin 3.4 (*)    ALT 13 (*)    GFR calc non Af Amer 55 (*)    All other components within normal limits  CULTURE, BLOOD (ROUTINE X 2)  CULTURE, BLOOD (ROUTINE X 2)  CBC WITH DIFFERENTIAL/PLATELET  URINALYSIS, ROUTINE W REFLEX MICROSCOPIC  I-STAT CG4 LACTIC ACID, ED    Radiology Dg Chest 2 View  Result Date: 01/12/2017 CLINICAL DATA:  Cough. Persistent urinary tract infection despite 4 days of antibiotics. EXAM: CHEST  2 VIEW COMPARISON:  07/26/2015 FINDINGS: Stable mild cardiomegaly. The lungs are clear. Pulmonary vasculature is normal. No pleural effusion. Moderate thoracolumbar scoliosis. Hilar and mediastinal contours are unremarkable and unchanged. IMPRESSION: Stable mild cardiomegaly.  No consolidation or effusion. Electronically Signed   By: Andreas Newport M.D.   On: 01/12/2017 04:06    Procedures Procedures (including critical care time)  Medications Ordered in ED Medications - No data to display   Initial Impression / Assessment and Plan / ED Course  I have reviewed the triage vital signs and the nursing notes.  Pertinent labs & imaging results that were available during my care of the patient were reviewed by me and considered in my medical decision making (see chart for details).  Report of recent UTI, general malaise. She will be sent for chest x-ray to rule out pneumonia, urinalysis will be repeated, and screening labs obtained. Old records are reviewed, and she does have prior ED visits for urinary tract infections.  ED workup is unremarkable. Urinalysis is normal, lactic acid is normal. WBC is normal with normal differential. Metabolic panel is unremarkable. Chest x-ray shows no evidence of pneumonia. At this point, this seems to be a viral illness. Results were discussed with patient, and treatment will be symptomatic. Advised to return should these symptoms occur or should current symptoms worsen. Advised to continue taking  trimethoprim-sulfamethoxazole until she has completed the prescription. She had complained of some nausea at home, so is discharged with a prescription for ondansetron.  Final Clinical Impressions(s) / ED Diagnoses   Final diagnoses:  Influenza-like illness    New Prescriptions New Prescriptions   ONDANSETRON (ZOFRAN) 4 MG TABLET    Take 1 tablet (4 mg total) by mouth every 6 (six) hours  as needed for nausea.     Delora Fuel, MD 59/56/38 [phone]

## 2017-01-16 ENCOUNTER — Encounter: Payer: Medicare Other | Admitting: Internal Medicine

## 2017-01-16 ENCOUNTER — Non-Acute Institutional Stay: Payer: Medicare Other | Admitting: Internal Medicine

## 2017-01-16 ENCOUNTER — Encounter: Payer: Self-pay | Admitting: Internal Medicine

## 2017-01-16 DIAGNOSIS — N390 Urinary tract infection, site not specified: Secondary | ICD-10-CM

## 2017-01-16 DIAGNOSIS — F430 Acute stress reaction: Secondary | ICD-10-CM | POA: Diagnosis not present

## 2017-01-16 DIAGNOSIS — K219 Gastro-esophageal reflux disease without esophagitis: Secondary | ICD-10-CM | POA: Diagnosis not present

## 2017-01-16 DIAGNOSIS — R531 Weakness: Secondary | ICD-10-CM | POA: Diagnosis not present

## 2017-01-16 DIAGNOSIS — N3090 Cystitis, unspecified without hematuria: Secondary | ICD-10-CM

## 2017-01-16 DIAGNOSIS — E662 Morbid (severe) obesity with alveolar hypoventilation: Secondary | ICD-10-CM | POA: Diagnosis not present

## 2017-01-16 DIAGNOSIS — R110 Nausea: Secondary | ICD-10-CM

## 2017-01-16 DIAGNOSIS — I10 Essential (primary) hypertension: Secondary | ICD-10-CM

## 2017-01-16 DIAGNOSIS — F411 Generalized anxiety disorder: Secondary | ICD-10-CM

## 2017-01-16 DIAGNOSIS — B9689 Other specified bacterial agents as the cause of diseases classified elsewhere: Secondary | ICD-10-CM

## 2017-01-16 DIAGNOSIS — B9620 Unspecified Escherichia coli [E. coli] as the cause of diseases classified elsewhere: Secondary | ICD-10-CM

## 2017-01-16 NOTE — Progress Notes (Addendum)
Provider:  Blanchie Serve MD  Location:  [facility] Room Number: 161 Place of Service:  ALF (13)  PCP: Blanchie Serve, MD Patient Care Team: Blanchie Serve, MD as PCP - General (Internal Medicine) Josue Hector, MD as Consulting Physician (Cardiology) Azucena Fallen, MD as Consulting Physician (Obstetrics and Gynecology) Chesley Mires, MD as Consulting Physician (Pulmonary Disease) Mast, Man X, NP as Nurse Practitioner (Internal Medicine)  Extended Emergency Contact Information Primary Emergency Contact: Chauncey,Amy Address: [address]          [place], [place] [address] Johnnette Litter of [place] Phone: [phone] Relation: Daughter Secondary Emergency Contact: Boxman,Lee Address: [address]          [place], [place] [address] Johnnette Litter of [place] Phone: [phone] Relation: Daughter  Code Status: Full Code Goals of Care: Advanced Directive information Advanced Directives 01/12/2017  Does Patient Have a Medical Advance Directive? No;Yes  Type of Advance Directive [place]  Does patient want to make changes to medical advance directive? No - Patient declined  Copy of [place] in Chart? Yes  Would patient like information on creating a medical advance directive? No - Patient declined  Pre-existing out of facility DNR order (yellow form or pink MOST form) -    Chief complaint- admission visit to assisted living  HPI: Patient is a 83 y.o. female seen today for admission visit. She was residing in independent living prior to this and had recent ED visit on 01/12/17 for nausea and chills with concern for UTI. Infectious workup in ED was negative for bacterial infection. There was concern for viral illness, she was provided with ondansetron and discharged home. She was then transferred to [facility] setting here for closer monitoring of her health and to provide assistance with ADLs as needed. She is seen in  her room today. Of note, she recently lost her grandson to gunshot/ suicide. She has been anxious about this. She complaints of intermittent chills and nausea. Denies any vomiting or fever.  On chart review, noted that pt had been to urgent care on 01/10/17 and had u/a with culture sent that resulted on 01/13/17 with klebsiella and e.coli bacteriuria. She has not been started on antibiotic yet.    Past Medical History:  Diagnosis Date  . ARTHRITIS, KNEES, BILATERAL   . Breast CA ([facility]) 2000   s/p R lumpectomy and XRT  . DEPRESSION   . DJD (degenerative joint disease) of knee   . GERD   . HYPERLIPIDEMIA   . HYPERTENSION   . HYPOTHYROIDISM    postsurgical  . Hypoxia   . MIGRAINE HEADACHE   . Morbid obesity ([facility])   . OSA (obstructive sleep apnea) 01/17/2011 dx  . Oxygen dependent   . Urge incontinence   . UTI (lower urinary tract infection) 12/2014   Past Surgical History:  Procedure Laterality Date  . ABDOMINAL HYSTERECTOMY    . ADENOIDECTOMY    . BREAST BIOPSY  2000  . BREAST LUMPECTOMY  2000  . CATARACT EXTRACTION    . CHOLECYSTECTOMY  1995  . CYSTOSCOPY/URETEROSCOPY/HOLMIUM LASER Right 06/28/2015   Procedure: CYSTOSCOPY RIGHT RETROGRAD RIGHT URETEROSCOPY/HOLMIUM LASER WITH RIGHT STENT PLACEMENT;  Surgeon: Alexis Frock, MD;  Location: [facility];  Service: Urology;  Laterality: Right;  . PARATHYROIDECTOMY     2-3 removed  . REPLACEMENT TOTAL KNEE BILATERAL  1999, 2005  . Right leg femur repaired    . THYROIDECTOMY    .  TONSILLECTOMY    . TUBAL LIGATION      reports that she quit smoking about 42 years ago. Her smoking use included Cigarettes. She has a 0.50 pack-year smoking history. She has never used smokeless tobacco. She reports that she does not drink alcohol or use drugs. Social History   Social History  . Marital status: Married    Spouse name: N/A  . Number of children: 5  . Years of education: N/A   Occupational History  . RETIRED Retired    worked on office    Social History Main Topics  . Smoking status: Former Smoker    Packs/day: 0.25    Years: 2.00    Types: Cigarettes    Quit date: 06/19/1974  . Smokeless tobacco: Never Used     Comment: Quit in late 20's  . Alcohol use No     Comment: rare  . Drug use: No  . Sexual activity: No   Other Topics Concern  . Not on file   Social History Narrative   Married to husband that has dementia (he lives in the skilled nursing wing) and this is a big stressor. 5 children. 10 grandchildren. 2 greatgrandchildren. All children Joaquin      Lives at [facility], in independent living.      Retired from multiple different Community education officer, university, Psychologist, educational.       Hobbies: Management consultant, write poetry, craft dolls    Functional Status Survey:    Family History  Problem Relation Age of Onset  . Emphysema Sister   . Coronary artery disease Neg Hx     Health Maintenance  Topic Date Due  . DEXA SCAN  06/19/2017 (Originally 09/07/1997)  . INFLUENZA VACCINE  01/17/2017  . TETANUS/TDAP  10/05/2019  . PNA vac Low Risk Adult  Completed    Allergies  Allergen Reactions  . Azithromycin Itching  . Beta Adrenergic Blockers     Depression   . Ciprofloxacin Other (See Comments)    REACTION: edgy and very jumpy   . Codeine Nausea And Vomiting  . Epinephrine Other (See Comments)    REACTION: rapid pulse, sweats  . Klonopin [Clonazepam] Other (See Comments)    Makes her feel very suicidal   . Oxycodone Other (See Comments)    Patient felt like it altered her mental status "Crazy" . Hallucinations later as well.  [name] Paxil [Paroxetine Hydrochloride] Other (See Comments)    Sever depression   . Prednisone     "makes me feel really bad"  . Propoxyphene Hcl Nausea And Vomiting  . Tramadol Other (See Comments)    Feels "jittery"  . Vicodin [Hydrocodone-Acetaminophen] Other (See Comments)    Hallucinations  . Meloxicam Diarrhea  . Vancomycin Rash    Localized rash related to infusion  rate.    Outpatient Encounter Prescriptions as of 01/16/2017  Medication Sig  . acetaminophen (TYLENOL) 325 MG tablet Take 650 mg by mouth every 8 (eight) hours as needed for mild pain or moderate pain.   [name] amLODipine-valsartan (EXFORGE) 5-160 MG tablet TAKE 1 TABLET ONCE DAILY.  [name] aspirin 81 MG tablet Take 81 mg by mouth daily.   . clotrimazole (LOTRIMIN) 1 % cream Apply 1 application topically 2 (two) times daily. As needed for fungal skin infections  . diazepam (VALIUM) 5 MG tablet Take 5 mg by mouth every 6 (six) hours as needed for anxiety.  . diphenoxylate-atropine (LOMOTIL) 2.5-0.025 MG tablet TAKE 1 TABLET FOUR TIMES DAILY AS NEEDED  FOR DIARRHEA OR LOOSE STOOLS.  [name] docusate sodium (COLACE) 100 MG capsule Take 1 capsule (100 mg total) by mouth 2 (two) times daily as needed for mild constipation or moderate constipation.  [name] FIBER PO Take 2 tablets by mouth every morning.   . fluticasone-salmeterol (ADVAIR HFA) 45-21 MCG/ACT inhaler Inhale 1 puff into the lungs 2 (two) times daily as needed.   . hydrochlorothiazide (HYDRODIURIL) 12.5 MG tablet TAKE 1 TABLET ONCE DAILY.  [name] isometheptene-acetaminophen-dichloralphenazone (MIDRIN) 65-100-325 MG capsule Take 1 capsule by mouth 4 (four) times daily as needed for migraine. Maximum 5 capsules in 12 hours for migraine headaches, 8 capsules in 24 hours for tension headaches.  . levothyroxine (SYNTHROID, LEVOTHROID) 150 MCG tablet Take 1 tablet (150 mcg total) by mouth daily.  . naproxen sodium (ANAPROX) 220 MG tablet Take 220 mg by mouth as needed (pain).   [name] omeprazole (PRILOSEC) 20 MG capsule TAKE (1) CAPSULE DAILY.  [name] ondansetron (ZOFRAN) 4 MG tablet Take 1 tablet (4 mg total) by mouth every 6 (six) hours as needed for nausea.  . polyethylene glycol powder (GLYCOLAX/MIRALAX) powder Take 17 g by mouth daily as needed for moderate constipation.  . potassium chloride (MICRO-K) 10 MEQ CR capsule TAKE 4 CAPSULES DAILY FOR POTASSIUM  . [DISCONTINUED]  diazepam (VALIUM) 5 MG tablet Take 1 tablet (5 mg total) by mouth every 12 (twelve) hours as needed for anxiety.   No facility-administered encounter medications on file as of 01/16/2017.     Review of Systems  Constitutional: Positive for appetite change, chills and fatigue. Negative for diaphoresis and fever.       Energy level is fair today.  HENT: Positive for postnasal drip. Negative for congestion, ear discharge, ear pain, mouth sores, rhinorrhea, trouble swallowing and voice change.           Eyes: Negative for visual disturbance.  Respiratory: Positive for cough and shortness of breath.        Occasional coughing. On chronic o2 by nasal canula and uses cpap at bedtime.    Gastrointestinal: Positive for constipation and nausea. Negative for abdominal distention, abdominal pain and blood in stool.       Miralax helps. Last bowel movement was this morning. No blood.   Genitourinary: Positive for frequency. Negative for dysuria, hematuria and pelvic pain.       Urinary incontinence  Musculoskeletal: Positive for neck pain.       Uses motorized wheelchair. Self assists for transfers. Chronic Left shoulder/neck pain that resolves some with massage.   Neurological: Positive for dizziness and weakness. Negative for syncope.       Had a headache last night but resolved now. has Dizziness with the change of position. has chronic Numbness/tingling in the left arm.  Hematological: Does not bruise/bleed easily.  Psychiatric/Behavioral: Negative for confusion. The patient is nervous/anxious.     Vitals:   01/16/17 1113  BP: (!) 142/80  Pulse: 82  Resp: 18  Temp: 98.5 F (36.9 C)  TempSrc: Oral  SpO2: 93%  Weight: (!) 315 lb (142.9 kg)  Height: $Remove'5\' 5"'VgyFKew$  (1.651 m)   Body mass index is 52.42 kg/m. Physical Exam  Constitutional: She is oriented to person, place, and time. No distress.  Morbidly obese  HENT:  Head: Normocephalic and atraumatic.  Mouth/Throat: Oropharynx is clear and  moist.  Eyes: Pupils are equal, round, and reactive to light. Conjunctivae and EOM are normal.  Neck: Normal range of motion. Neck supple. No JVD present. No thyromegaly present.  Cardiovascular: Normal rate and regular rhythm.   Pulmonary/Chest: Effort normal. No respiratory distress. She has no wheezes. She has no rales.  Decreased air entry to lung bases  Abdominal: Soft. Bowel sounds are normal. She exhibits distension. There is no tenderness. There is no rebound and no guarding.  Genitourinary:  Genitourinary Comments: No CVA and suprapubic tenderness  Musculoskeletal:  Can move all 4 extremities, lower extremity weakness, on motorized wheelchair, chronic lymphedema with stasis changes to her legs  Lymphadenopathy:    She has no cervical adenopathy.  Neurological: She is alert and oriented to person, place, and time.  Skin: Skin is warm and dry. No rash noted. She is not diaphoretic.  Psychiatric:  Anxious, good eye contact    Labs reviewed: Basic Metabolic Panel:  Recent Labs  11/02/16 2043 11/26/16 01/12/17 0307  NA 140 138 139  K 3.8 4.0 4.0  CL 101  --  103  CO2 33*  --  29  GLUCOSE 108*  --  107*  BUN 16 22* 14  CREATININE 0.94 0.8 0.93  CALCIUM 7.6*  --  8.6*   Liver Function Tests:  Recent Labs  10/05/16 11/26/16 01/12/17 0307  AST $Re'20 14 15  'gQI$ ALT 14 12 13*  ALKPHOS 88 99 73  BILITOT  --   --  0.7  PROT  --   --  6.5  ALBUMIN  --   --  3.4*   No results for input(s): LIPASE, AMYLASE in the last 8760 hours. No results for input(s): AMMONIA in the last 8760 hours. CBC:  Recent Labs  11/02/16 2043 11/26/16 01/12/17 0307  WBC 7.2 6.9 6.1  NEUTROABS  --   --  4.2  HGB 13.0 12.7 12.8  HCT 39.9 39 38.5  MCV 90.5  --  89.1  PLT 143* 179 158   Cardiac Enzymes: No results for input(s): CKTOTAL, CKMB, CKMBINDEX, TROPONINI in the last 8760 hours. BNP: Invalid input(s): POCBNP Lab Results  Component Value Date   HGBA1C 6.0 10/05/2016   Lab Results   Component Value Date   TSH 0.43 10/05/2016   No results found for: VITAMINB12 No results found for: FOLATE No results found for: IRON, TIBC, FERRITIN  Imaging and Procedures obtained prior to SNF admission: Dg Chest 2 View  Result Date: 01/12/2017 CLINICAL DATA:  Cough. Persistent urinary tract infection despite 4 days of antibiotics. EXAM: CHEST  2 VIEW COMPARISON:  07/26/2015 FINDINGS: Stable mild cardiomegaly. The lungs are clear. Pulmonary vasculature is normal. No pleural effusion. Moderate thoracolumbar scoliosis. Hilar and mediastinal contours are unremarkable and unchanged. IMPRESSION: Stable mild cardiomegaly.  No consolidation or effusion. Electronically Signed   By: Andreas Newport M.D.   On: 01/12/2017 04:06    Assessment/Plan  Generalized weakness From deconditioning, anxiety and UTI. Will have her work with physical therapy and occupational therapy team to help with gait training and muscle strengthening exercises.fall precautions. Skin care. Encourage to be out of bed. UTI and anxiety treatment as below  Acute cystitis With klebsiella and E.coli. Reviewed sensitivity report. Start augmentin 875 mg po q12h x 1 week with florastor 250 mg bid x 2 weeks. Maintain perineal hygiene and hydration.  Nausea Her anxiety and UTI could both be contributing to this. Continue zofran but change to bid along with q6h prn in between for 5 days and then prn only. Monitor. Able to keep po feed. No vomiting for now. monitor  Anxiety Lab Results  Component Value Date   TSH 0.43 10/05/2016  Likely Situational. Currently on diazepam 5 mg q6h prn for anxiety. Start sertraline 25 mg daily and increase to 50 mg daily in 1 week and monitor. Check TSH  Constipation With IBS. On fiber supplement, colace prn and miralax as needed. Monitor  HTN Continue HCTZ currently regimen. Also on amlodipine-valsartan 5-160 mg daily. With FDA recall on valsartan, change this to amlodipine daily and  losartan 100 mg daily and monitor. Check bmp  gerd With her symptom of nausea, change omeprazole to 20 mg bid x 1 week, then daily and monitor. D/c naproxen for now  Obesity hypoventilation syndrome Continue o2 by nasal canula with goal o2 >88% with exertion and 90% with rest. Continue advair   Family/ staff Communication: reviewed care plan with pt and her charge nurse  Labs/tests ordered: cbc with diff, bmp, TSH next lab  I have spent greater than 60 minutes in this visit- chart reviewed, pt interviewed and examined as above, new orders provided, questions from pt has been addressed and care plan reviewed with charge nurse.  Blanchie Serve, MD Internal Medicine [facility] [address] [place], [place] [address] Cell Phone (Monday-Friday 8 am - 5 pm): [phone] On Call: [phone] and follow prompts after 5 pm and on weekends Office Phone: [phone] Office Fax: [phone]

## 2017-01-17 DIAGNOSIS — M129 Arthropathy, unspecified: Secondary | ICD-10-CM | POA: Diagnosis not present

## 2017-01-17 DIAGNOSIS — R4189 Other symptoms and signs involving cognitive functions and awareness: Secondary | ICD-10-CM | POA: Diagnosis not present

## 2017-01-17 DIAGNOSIS — M6281 Muscle weakness (generalized): Secondary | ICD-10-CM | POA: Diagnosis not present

## 2017-01-17 DIAGNOSIS — R635 Abnormal weight gain: Secondary | ICD-10-CM | POA: Diagnosis not present

## 2017-01-17 DIAGNOSIS — N201 Calculus of ureter: Secondary | ICD-10-CM | POA: Diagnosis not present

## 2017-01-17 DIAGNOSIS — N179 Acute kidney failure, unspecified: Secondary | ICD-10-CM | POA: Diagnosis not present

## 2017-01-17 DIAGNOSIS — I10 Essential (primary) hypertension: Secondary | ICD-10-CM | POA: Diagnosis not present

## 2017-01-17 DIAGNOSIS — M542 Cervicalgia: Secondary | ICD-10-CM | POA: Diagnosis not present

## 2017-01-17 DIAGNOSIS — N1330 Unspecified hydronephrosis: Secondary | ICD-10-CM | POA: Diagnosis not present

## 2017-01-17 DIAGNOSIS — R2689 Other abnormalities of gait and mobility: Secondary | ICD-10-CM | POA: Diagnosis not present

## 2017-01-17 DIAGNOSIS — R29898 Other symptoms and signs involving the musculoskeletal system: Secondary | ICD-10-CM | POA: Diagnosis not present

## 2017-01-17 DIAGNOSIS — E662 Morbid (severe) obesity with alveolar hypoventilation: Secondary | ICD-10-CM | POA: Diagnosis not present

## 2017-01-17 DIAGNOSIS — F329 Major depressive disorder, single episode, unspecified: Secondary | ICD-10-CM | POA: Diagnosis not present

## 2017-01-17 DIAGNOSIS — E039 Hypothyroidism, unspecified: Secondary | ICD-10-CM | POA: Diagnosis not present

## 2017-01-17 DIAGNOSIS — M25559 Pain in unspecified hip: Secondary | ICD-10-CM | POA: Diagnosis not present

## 2017-01-17 DIAGNOSIS — F419 Anxiety disorder, unspecified: Secondary | ICD-10-CM | POA: Diagnosis not present

## 2017-01-17 DIAGNOSIS — N184 Chronic kidney disease, stage 4 (severe): Secondary | ICD-10-CM | POA: Diagnosis not present

## 2017-01-17 DIAGNOSIS — N132 Hydronephrosis with renal and ureteral calculous obstruction: Secondary | ICD-10-CM | POA: Diagnosis not present

## 2017-01-17 LAB — CULTURE, BLOOD (ROUTINE X 2)
CULTURE: NO GROWTH
Special Requests: ADEQUATE

## 2017-01-18 DIAGNOSIS — N184 Chronic kidney disease, stage 4 (severe): Secondary | ICD-10-CM | POA: Diagnosis not present

## 2017-01-18 DIAGNOSIS — I10 Essential (primary) hypertension: Secondary | ICD-10-CM | POA: Diagnosis not present

## 2017-01-18 LAB — BASIC METABOLIC PANEL
BUN: 15 (ref 4–21)
CREATININE: 0.9 (ref <=1.1)
Glucose: 96
POTASSIUM: 4 (ref 3.4–5.3)
SODIUM: 141 (ref 137–147)

## 2017-01-18 LAB — CBC AND DIFFERENTIAL
HCT: 40 (ref 36–46)
Hemoglobin: 13 (ref 12.0–16.0)
Platelets: 142 — AB (ref 150–399)
WBC: 6.3

## 2017-01-18 LAB — HEPATIC FUNCTION PANEL
ALK PHOS: 77 (ref 25–125)
ALT: 9 (ref 7–35)
AST: 10 — AB (ref 13–35)
BILIRUBIN, TOTAL: 0.5

## 2017-01-19 ENCOUNTER — Other Ambulatory Visit: Payer: Self-pay | Admitting: *Deleted

## 2017-01-19 DIAGNOSIS — M542 Cervicalgia: Secondary | ICD-10-CM | POA: Diagnosis not present

## 2017-01-19 DIAGNOSIS — I10 Essential (primary) hypertension: Secondary | ICD-10-CM | POA: Diagnosis not present

## 2017-01-19 DIAGNOSIS — R635 Abnormal weight gain: Secondary | ICD-10-CM | POA: Diagnosis not present

## 2017-01-19 DIAGNOSIS — M6281 Muscle weakness (generalized): Secondary | ICD-10-CM | POA: Diagnosis not present

## 2017-01-19 DIAGNOSIS — E662 Morbid (severe) obesity with alveolar hypoventilation: Secondary | ICD-10-CM | POA: Diagnosis not present

## 2017-01-22 ENCOUNTER — Non-Acute Institutional Stay: Payer: Medicare Other | Admitting: Nurse Practitioner

## 2017-01-22 ENCOUNTER — Encounter: Payer: Self-pay | Admitting: Nurse Practitioner

## 2017-01-22 DIAGNOSIS — F32A Depression, unspecified: Secondary | ICD-10-CM

## 2017-01-22 DIAGNOSIS — F419 Anxiety disorder, unspecified: Secondary | ICD-10-CM

## 2017-01-22 DIAGNOSIS — R339 Retention of urine, unspecified: Secondary | ICD-10-CM | POA: Diagnosis not present

## 2017-01-22 DIAGNOSIS — F329 Major depressive disorder, single episode, unspecified: Secondary | ICD-10-CM

## 2017-01-22 DIAGNOSIS — N201 Calculus of ureter: Secondary | ICD-10-CM | POA: Diagnosis not present

## 2017-01-22 NOTE — Assessment & Plan Note (Signed)
Hx of ureterolithiasis(R), underwent cystoscopy and Urology evaluation, s/p laser lithotripsy and right ureteral stent placement by Dr. Tresa Moore 06/22/15 to 07/02/15 during hospital stay. She denied nausea, vomiting, diarrhea, constipation, suprapubic or CVA tenderness, she is afebrile.  F/u Urology

## 2017-01-22 NOTE — Assessment & Plan Note (Signed)
She is anxious today, will continue Sertraline, observe the patient.

## 2017-01-22 NOTE — Assessment & Plan Note (Addendum)
Continue Foley for now, f/u Urology ASAP, may obtain UA C/S if s/s of UTI devolop

## 2017-01-22 NOTE — Progress Notes (Signed)
Location:  [facility] Room Number: 235 Place of Service:  ALF [phone]) Provider:  Zamir Staples, Manxie  NP  Blanchie Serve, MD  Patient Care Team: Blanchie Serve, MD as PCP - General (Internal Medicine) Josue Hector, MD as Consulting Physician (Cardiology) Azucena Fallen, MD as Consulting Physician (Obstetrics and Gynecology) Chesley Mires, MD as Consulting Physician (Pulmonary Disease) Zebulon Gantt X, NP as Nurse Practitioner (Internal Medicine)  Extended Emergency Contact Information Primary Emergency Contact: Chauncey,Amy Address: [address]          [place], [place] [address] Johnnette Litter of [place] Phone: [phone] Relation: Daughter Secondary Emergency Contact: Boxman,Lee Address: [address]          [place], [place] [address] [place] Phone: [phone] Relation: Daughter  Code Status:  Full Code Goals of care: Advanced Directive information Advanced Directives 01/22/2017  Does Patient Have a Medical Advance Directive? Yes  Type of Advance Directive [place]  Does patient want to make changes to medical advance directive? No - Patient declined  Copy of [place] in Chart? Yes  Would patient like information on creating a medical advance directive? No - Patient declined  Pre-existing out of facility DNR order (yellow form or pink MOST form) -     Chief Complaint  Patient presents with  . Acute Visit    Having problems urinating, 6 hours    HPI:  Pt is a 83 y.o. female seen today for an acute visit for difficulty urinating, Foley inserted 01/20/17, Hx of ureterolithiasis(R), underwent cystoscopy and Urology evaluation, s/p laser lithotripsy and right ureteral stent placement by Dr. Tresa Moore 06/22/15 to 07/02/15 during hospital stay. She denied nausea, vomiting, diarrhea, constipation, suprapubic or CVA tenderness, she is afebrile.   The patient is rather anxious than usual, she takes  Sertraline daily.   Past Medical History:  Diagnosis Date  . ARTHRITIS, KNEES, BILATERAL   . Breast CA ([place]) 2000   s/p R lumpectomy and XRT  . DEPRESSION   . DJD (degenerative joint disease) of knee   . GERD   . HYPERLIPIDEMIA   . HYPERTENSION   . HYPOTHYROIDISM    postsurgical  . Hypoxia   . MIGRAINE HEADACHE   . Morbid obesity ([place])   . OSA (obstructive sleep apnea) 01/17/2011 dx  . Oxygen dependent   . Urge incontinence   . UTI (lower urinary tract infection) 12/2014   Past Surgical History:  Procedure Laterality Date  . ABDOMINAL HYSTERECTOMY    . ADENOIDECTOMY    . BREAST BIOPSY  2000  . BREAST LUMPECTOMY  2000  . CATARACT EXTRACTION    . CHOLECYSTECTOMY  1995  . CYSTOSCOPY/URETEROSCOPY/HOLMIUM LASER Right 06/28/2015   Procedure: CYSTOSCOPY RIGHT RETROGRAD RIGHT URETEROSCOPY/HOLMIUM LASER WITH RIGHT STENT PLACEMENT;  Surgeon: Alexis Frock, MD;  Location: [facility];  Service: Urology;  Laterality: Right;  . PARATHYROIDECTOMY     2-3 removed  . REPLACEMENT TOTAL KNEE BILATERAL  1999, 2005  . Right leg femur repaired    . THYROIDECTOMY    . TONSILLECTOMY    . TUBAL LIGATION      Allergies  Allergen Reactions  . Azithromycin Itching  . Beta Adrenergic Blockers     Depression   . Ciprofloxacin Other (See Comments)    REACTION: edgy and very jumpy   . Codeine Nausea And Vomiting  . Epinephrine Other (See Comments)    REACTION: rapid pulse, sweats  . Klonopin [Clonazepam]  Other (See Comments)    Makes her feel very suicidal   . Oxycodone Other (See Comments)    Patient felt like it altered her mental status "Crazy" . Hallucinations later as well.  [name] Paxil [Paroxetine Hydrochloride] Other (See Comments)    Sever depression   . Prednisone     "makes me feel really bad"  . Propoxyphene Hcl Nausea And Vomiting  . Tramadol Other (See Comments)    Feels "jittery"  . Vicodin [Hydrocodone-Acetaminophen] Other (See Comments)    Hallucinations  . Meloxicam Diarrhea    . Vancomycin Rash    Localized rash related to infusion rate.    Outpatient Encounter Prescriptions as of 01/22/2017  Medication Sig  . acetaminophen (TYLENOL) 325 MG tablet Take 650 mg by mouth every 8 (eight) hours as needed for mild pain or moderate pain.   [name] amLODipine-valsartan (EXFORGE) 5-160 MG tablet TAKE 1 TABLET ONCE DAILY.  [name] amoxicillin-clavulanate (AUGMENTIN) 875-125 MG tablet Take 1 tablet by mouth 2 (two) times daily.  [name] aspirin 81 MG tablet Take 81 mg by mouth daily.   . clotrimazole (LOTRIMIN) 1 % cream Apply 1 application topically 2 (two) times daily. As needed for fungal skin infections  . diazepam (VALIUM) 5 MG tablet Take 5 mg by mouth every 6 (six) hours as needed for anxiety.  . docusate sodium (COLACE) 100 MG capsule Take 1 capsule (100 mg total) by mouth 2 (two) times daily as needed for mild constipation or moderate constipation.  [name] FIBER PO Take 2 tablets by mouth every morning.   . fluticasone-salmeterol (ADVAIR HFA) 45-21 MCG/ACT inhaler Inhale 1 puff into the lungs 2 (two) times daily as needed.   . hydrochlorothiazide (HYDRODIURIL) 12.5 MG tablet TAKE 1 TABLET ONCE DAILY.  [name] isometheptene-acetaminophen-dichloralphenazone (MIDRIN) 65-100-325 MG capsule Take 1 capsule by mouth 4 (four) times daily as needed for migraine. Maximum 5 capsules in 12 hours for migraine headaches, 8 capsules in 24 hours for tension headaches.  . losartan (COZAAR) 100 MG tablet Take 100 mg by mouth daily. Hold for SBP less than 110.  . omeprazole (PRILOSEC) 20 MG capsule TAKE (1) CAPSULE DAILY.  [name] ondansetron (ZOFRAN) 4 MG tablet Take 1 tablet (4 mg total) by mouth every 6 (six) hours as needed for nausea.  . polyethylene glycol powder (GLYCOLAX/MIRALAX) powder Take 17 g by mouth daily as needed for moderate constipation.  . potassium chloride (MICRO-K) 10 MEQ CR capsule TAKE 4 CAPSULES DAILY FOR POTASSIUM  . saccharomyces boulardii (FLORASTOR) 250 MG capsule Take 250 mg by mouth 2 (two)  times daily.  . sertraline (ZOLOFT) 25 MG tablet Take 25 mg by mouth daily.  . sertraline (ZOLOFT) 50 MG tablet Take 50 mg by mouth daily. For anxiety  . diphenoxylate-atropine (LOMOTIL) 2.5-0.025 MG tablet TAKE 1 TABLET FOUR TIMES DAILY AS NEEDED FOR DIARRHEA OR LOOSE STOOLS.  . [DISCONTINUED] levothyroxine (SYNTHROID, LEVOTHROID) 150 MCG tablet Take 1 tablet (150 mcg total) by mouth daily.  . [DISCONTINUED] naproxen sodium (ANAPROX) 220 MG tablet Take 220 mg by mouth as needed (pain).    No facility-administered encounter medications on file as of 01/22/2017.     Review of Systems  Constitutional: Negative for appetite change, chills, diaphoresis, fatigue and fever.       Energy level is fair today.  HENT: Positive for postnasal drip. Negative for congestion.           Eyes: Negative for visual disturbance.  Respiratory: Positive for cough and shortness of breath.  Occasional coughing. On chronic o2 by nasal canula and uses cpap at bedtime.    Cardiovascular: Positive for leg swelling.       Chronic lymphedema BLE  Gastrointestinal: Negative for abdominal distention and abdominal pain.  Genitourinary: Positive for difficulty urinating. Negative for hematuria and pelvic pain.       Foley inserted for urinary retention 01/20/17  Musculoskeletal: Positive for neck pain.       Uses motorized wheelchair. Chronic Left shoulder/neck pain that resolves some with massage.   Neurological: Negative for dizziness and syncope.       Has Dizziness with the change of position. has chronic Numbness/tingling in the left arm.  Psychiatric/Behavioral: Negative for confusion. The patient is nervous/anxious.     Immunization History  Administered Date(s) Administered  . Influenza Split 03/20/2011  . Influenza Whole 03/19/2009, 03/19/2012  . Influenza,inj,Quad PF,36+ Mos 03/19/2013  . Influenza-Unspecified 03/03/2014, 03/15/2015, 03/30/2016  . Meningococcal Polysaccharide 03/15/2015  . PPD Test  03/30/2016  . Pneumococcal Conjugate-13 03/12/2014  . Pneumococcal Polysaccharide-23 06/19/2006, 03/15/2015  . Td 10/04/2009  . Zoster 06/19/2010   Pertinent  Health Maintenance Due  Topic Date Due  . INFLUENZA VACCINE  01/17/2017  . DEXA SCAN  06/19/2017 (Originally 09/07/1997)  . PNA vac Low Risk Adult  Completed   Fall Risk  12/22/2016 04/06/2016 11/27/2015 11/18/2015 08/26/2015  Falls in the past year? Yes Yes No No No  Number falls in past yr: 1 1 - - -  Injury with Fall? No Yes - - -  Risk for fall due to : - - - - -   Functional Status Survey:    Vitals:   01/22/17 1119  BP: 136/76  Pulse: 82  Resp: (!) 22  Temp: 98 F (36.7 C)  SpO2: 95%  Weight: (!) 308 lb (139.7 kg)  Height: $Remove'5\' 5"'PBvNvVY$  (1.651 m)   Body mass index is 51.25 kg/m. Physical Exam  Constitutional: She is oriented to person, place, and time. She appears well-developed and well-nourished.  Morbidly obese  HENT:  Head: Normocephalic and atraumatic.  Eyes: Pupils are equal, round, and reactive to light. Conjunctivae and EOM are normal.  Neck: Normal range of motion. Neck supple. No thyromegaly present.  Cardiovascular: Normal rate and regular rhythm.   Pulmonary/Chest: Effort normal. No respiratory distress. She has no wheezes. She has no rales.  Abdominal: Soft. Bowel sounds are normal. She exhibits no distension. There is no tenderness. There is no rebound and no guarding.  Genitourinary:  Genitourinary Comments: No CVA and suprapubic tenderness  Musculoskeletal:  chronic lymphedema with stasis changes to her legs  Neurological: She is alert and oriented to person, place, and time. No cranial nerve deficit.  Skin: Skin is warm and dry. No rash noted.  Psychiatric:  Anxious    Labs reviewed:  Recent Labs  11/02/16 2043 11/26/16 01/12/17 0307 01/18/17  NA 140 138 139 141  K 3.8 4.0 4.0 4.0  CL 101  --  103  --   CO2 33*  --  29  --   GLUCOSE 108*  --  107*  --   BUN 16 22* 14 15  CREATININE 0.94  0.8 0.93 0.9  CALCIUM 7.6*  --  8.6*  --     Recent Labs  11/26/16 01/12/17 0307 01/18/17  AST 14 15 10*  ALT 12 13* 9  ALKPHOS 99 73 77  BILITOT  --  0.7  --   PROT  --  6.5  --  ALBUMIN  --  3.4*  --     Recent Labs  11/02/16 2043 11/26/16 01/12/17 0307 01/18/17  WBC 7.2 6.9 6.1 6.3  NEUTROABS  --   --  4.2  --   HGB 13.0 12.7 12.8 13.0  HCT 39.9 39 38.5 40  MCV 90.5  --  89.1  --   PLT 143* 179 158 142*   Lab Results  Component Value Date   TSH 0.43 10/05/2016   Lab Results  Component Value Date   HGBA1C 6.0 10/05/2016   Lab Results  Component Value Date   CHOL 172 07/29/2014   HDL 42.30 07/29/2014   LDLCALC 98 07/29/2014   LDLDIRECT 99.4 03/13/2012   TRIG 159.0 (H) 07/29/2014   CHOLHDL 4 07/29/2014    Significant Diagnostic Results in last 30 days:  Dg Chest 2 View  Result Date: 01/12/2017 CLINICAL DATA:  Cough. Persistent urinary tract infection despite 4 days of antibiotics. EXAM: CHEST  2 VIEW COMPARISON:  07/26/2015 FINDINGS: Stable mild cardiomegaly. The lungs are clear. Pulmonary vasculature is normal. No pleural effusion. Moderate thoracolumbar scoliosis. Hilar and mediastinal contours are unremarkable and unchanged. IMPRESSION: Stable mild cardiomegaly.  No consolidation or effusion. Electronically Signed   By: Andreas Newport M.D.   On: 01/12/2017 04:06    Assessment/Plan There are no diagnoses linked to this encounter.   Family/ staff Communication: plan of care reviewed with the patient and charge nurse, referral to Urology  Labs/tests ordered: none  Time spend: 25 minutes

## 2017-01-24 DIAGNOSIS — R339 Retention of urine, unspecified: Secondary | ICD-10-CM | POA: Diagnosis not present

## 2017-01-26 DIAGNOSIS — R635 Abnormal weight gain: Secondary | ICD-10-CM | POA: Diagnosis not present

## 2017-01-26 DIAGNOSIS — I10 Essential (primary) hypertension: Secondary | ICD-10-CM | POA: Diagnosis not present

## 2017-01-26 DIAGNOSIS — M6281 Muscle weakness (generalized): Secondary | ICD-10-CM | POA: Diagnosis not present

## 2017-01-26 DIAGNOSIS — M542 Cervicalgia: Secondary | ICD-10-CM | POA: Diagnosis not present

## 2017-01-26 DIAGNOSIS — E662 Morbid (severe) obesity with alveolar hypoventilation: Secondary | ICD-10-CM | POA: Diagnosis not present

## 2017-01-29 DIAGNOSIS — E662 Morbid (severe) obesity with alveolar hypoventilation: Secondary | ICD-10-CM | POA: Diagnosis not present

## 2017-01-29 DIAGNOSIS — M542 Cervicalgia: Secondary | ICD-10-CM | POA: Diagnosis not present

## 2017-01-29 DIAGNOSIS — I10 Essential (primary) hypertension: Secondary | ICD-10-CM | POA: Diagnosis not present

## 2017-01-29 DIAGNOSIS — M6281 Muscle weakness (generalized): Secondary | ICD-10-CM | POA: Diagnosis not present

## 2017-01-29 DIAGNOSIS — R635 Abnormal weight gain: Secondary | ICD-10-CM | POA: Diagnosis not present

## 2017-01-30 DIAGNOSIS — R339 Retention of urine, unspecified: Secondary | ICD-10-CM | POA: Diagnosis not present

## 2017-01-31 DIAGNOSIS — I10 Essential (primary) hypertension: Secondary | ICD-10-CM | POA: Diagnosis not present

## 2017-01-31 DIAGNOSIS — R635 Abnormal weight gain: Secondary | ICD-10-CM | POA: Diagnosis not present

## 2017-01-31 DIAGNOSIS — M6281 Muscle weakness (generalized): Secondary | ICD-10-CM | POA: Diagnosis not present

## 2017-01-31 DIAGNOSIS — M542 Cervicalgia: Secondary | ICD-10-CM | POA: Diagnosis not present

## 2017-01-31 DIAGNOSIS — E662 Morbid (severe) obesity with alveolar hypoventilation: Secondary | ICD-10-CM | POA: Diagnosis not present

## 2017-01-31 NOTE — Addendum Note (Signed)
Addended byBlanchie Serve on: 01/31/2017 01:49 PM   Modules accepted: Level of Service

## 2017-02-01 ENCOUNTER — Non-Acute Institutional Stay: Payer: Medicare Other | Admitting: Nurse Practitioner

## 2017-02-01 ENCOUNTER — Encounter: Payer: Self-pay | Admitting: Nurse Practitioner

## 2017-02-01 DIAGNOSIS — K219 Gastro-esophageal reflux disease without esophagitis: Secondary | ICD-10-CM | POA: Diagnosis not present

## 2017-02-01 DIAGNOSIS — N3000 Acute cystitis without hematuria: Secondary | ICD-10-CM | POA: Diagnosis not present

## 2017-02-01 DIAGNOSIS — E031 Congenital hypothyroidism without goiter: Secondary | ICD-10-CM | POA: Diagnosis not present

## 2017-02-01 DIAGNOSIS — F329 Major depressive disorder, single episode, unspecified: Secondary | ICD-10-CM

## 2017-02-01 DIAGNOSIS — F419 Anxiety disorder, unspecified: Secondary | ICD-10-CM | POA: Diagnosis not present

## 2017-02-01 DIAGNOSIS — I10 Essential (primary) hypertension: Secondary | ICD-10-CM | POA: Diagnosis not present

## 2017-02-01 DIAGNOSIS — F4323 Adjustment disorder with mixed anxiety and depressed mood: Secondary | ICD-10-CM | POA: Diagnosis not present

## 2017-02-01 DIAGNOSIS — F32A Depression, unspecified: Secondary | ICD-10-CM

## 2017-02-01 NOTE — Progress Notes (Signed)
Location:  [facility] Room Number: 295 Place of Service:  ALF [phone]) Provider:  Nomie Buchberger, Manxie  NP  Blanchie Serve, MD  Patient Care Team: Blanchie Serve, MD as PCP - General (Internal Medicine) Josue Hector, MD as Consulting Physician (Cardiology) Azucena Fallen, MD as Consulting Physician (Obstetrics and Gynecology) Chesley Mires, MD as Consulting Physician (Pulmonary Disease) Reiley Keisler X, NP as Nurse Practitioner (Internal Medicine)  Extended Emergency Contact Information Primary Emergency Contact: Chauncey,Amy Address: [address]          [place], [place] [address] Johnnette Litter of [place] Phone: [phone] Relation: Daughter Secondary Emergency Contact: Boxman,Lee Address: [address]          [place], [place] [address] [place] Phone: [phone] Relation: Daughter  Code Status:  Full Code Goals of care: Advanced Directive information Advanced Directives 02/01/2017  Does Patient Have a Medical Advance Directive? Yes  Type of Advance Directive [place]  Does patient want to make changes to medical advance directive? No - Patient declined  Copy of [place] in Chart? Yes  Would patient like information on creating a medical advance directive? No - Patient declined  Pre-existing out of facility DNR order (yellow form or pink MOST form) -     Chief Complaint  Patient presents with  . Acute Visit    HPI:  Pt is a 83 y.o. female seen today for discharge [place]  The patient was admitted to AL for observation following ED evaluation for concern of a viral illness in setting of nausea and chills. Her nausea and chills have been resolved. Her mood is stabilizing, the patient stated she crys appropriately since her recent lost of her grandson to gunshot. Psych recommendation 02/01/17 to increase Sertraline 100/50mg  to better manage her mood, but the patient stated she sleeps and eats well,  has not used prn Diazepam prn for 2-3 days, she desires to continue to f/u Psych and monitor for s/s of depression. The patient is independent with her ADLs and she is able to return IL  Hx HTN, blood pressure is controlled, taking Losartan 100mg , HTC 12.5mg , Amlodipine 5mg . GERD is stable on Omeprazole 20mg , prn Zofran. Hypothyroidism,  Levothyroxine 150mcg qd, last TSH 3.38 01/18/17.    Past Medical History:  Diagnosis Date  . ARTHRITIS, KNEES, BILATERAL   . Breast CA ([facility]) 2000   s/p R lumpectomy and XRT  . DEPRESSION   . DJD (degenerative joint disease) of knee   . GERD   . HYPERLIPIDEMIA   . HYPERTENSION   . HYPOTHYROIDISM    postsurgical  . Hypoxia   . MIGRAINE HEADACHE   . Morbid obesity ([facility])   . OSA (obstructive sleep apnea) 01/17/2011 dx  . Oxygen dependent   . Urge incontinence   . UTI (lower urinary tract infection) 12/2014   Past Surgical History:  Procedure Laterality Date  . ABDOMINAL HYSTERECTOMY    . ADENOIDECTOMY    . BREAST BIOPSY  2000  . BREAST LUMPECTOMY  2000  . CATARACT EXTRACTION    . CHOLECYSTECTOMY  1995  . CYSTOSCOPY/URETEROSCOPY/HOLMIUM LASER Right 06/28/2015   Procedure: CYSTOSCOPY RIGHT RETROGRAD RIGHT URETEROSCOPY/HOLMIUM LASER WITH RIGHT STENT PLACEMENT;  Surgeon: Alexis Frock, MD;  Location: [facility];  Service: Urology;  Laterality: Right;  . PARATHYROIDECTOMY     2-3 removed  . REPLACEMENT TOTAL KNEE BILATERAL  1999, 2005  . Right leg femur repaired    . THYROIDECTOMY    .  TONSILLECTOMY    . TUBAL LIGATION      Allergies  Allergen Reactions  . Azithromycin Itching  . Beta Adrenergic Blockers     Depression   . Ciprofloxacin Other (See Comments)    REACTION: edgy and very jumpy   . Codeine Nausea And Vomiting  . Epinephrine Other (See Comments)    REACTION: rapid pulse, sweats  . Klonopin [Clonazepam] Other (See Comments)    Makes her feel very suicidal   . Oxycodone Other (See Comments)    Patient felt like it altered her mental  status "Crazy" . Hallucinations later as well.  [name] Paxil [Paroxetine Hydrochloride] Other (See Comments)    Sever depression   . Prednisone     "makes me feel really bad"  . Propoxyphene Hcl Nausea And Vomiting  . Tramadol Other (See Comments)    Feels "jittery"  . Vicodin [Hydrocodone-Acetaminophen] Other (See Comments)    Hallucinations  . Meloxicam Diarrhea  . Vancomycin Rash    Localized rash related to infusion rate.    Outpatient Encounter Prescriptions as of 02/01/2017  Medication Sig  . acetaminophen (TYLENOL) 325 MG tablet Take 650 mg by mouth every 8 (eight) hours as needed for mild pain or moderate pain.   [name] amLODipine-valsartan (EXFORGE) 5-160 MG tablet TAKE 1 TABLET ONCE DAILY. (Patient not taking: Reported on 02/06/2017)  . aspirin 81 MG tablet Take 81 mg by mouth daily.   . clotrimazole (LOTRIMIN) 1 % cream Apply 1 application topically 2 (two) times daily. As needed for fungal skin infections  . diazepam (VALIUM) 5 MG tablet Take 5 mg by mouth every 6 (six) hours as needed for anxiety.  . diphenoxylate-atropine (LOMOTIL) 2.5-0.025 MG tablet TAKE 1 TABLET FOUR TIMES DAILY AS NEEDED FOR DIARRHEA OR LOOSE STOOLS.  [name] docusate sodium (COLACE) 100 MG capsule Take 1 capsule (100 mg total) by mouth 2 (two) times daily as needed for mild constipation or moderate constipation.  [name] FIBER PO Take 2 tablets by mouth every morning.   . fluticasone-salmeterol (ADVAIR HFA) 45-21 MCG/ACT inhaler Inhale 1 puff into the lungs 2 (two) times daily as needed.   . hydrochlorothiazide (HYDRODIURIL) 12.5 MG tablet TAKE 1 TABLET ONCE DAILY.  [name] isometheptene-acetaminophen-dichloralphenazone (MIDRIN) 65-100-325 MG capsule Take 1 capsule by mouth 4 (four) times daily as needed for migraine. Maximum 5 capsules in 12 hours for migraine headaches, 8 capsules in 24 hours for tension headaches.  . losartan (COZAAR) 100 MG tablet Take 100 mg by mouth daily. Hold for SBP less than 110.  . omeprazole (PRILOSEC)  20 MG capsule TAKE (1) CAPSULE DAILY.  [name] ondansetron (ZOFRAN) 4 MG tablet Take 1 tablet (4 mg total) by mouth every 6 (six) hours as needed for nausea.  . polyethylene glycol powder (GLYCOLAX/MIRALAX) powder Take 17 g by mouth daily as needed for moderate constipation.  . potassium chloride (MICRO-K) 10 MEQ CR capsule TAKE 4 CAPSULES DAILY FOR POTASSIUM  . saccharomyces boulardii (FLORASTOR) 250 MG capsule Take 250 mg by mouth 2 (two) times daily.  . sertraline (ZOLOFT) 50 MG tablet Take 50 mg by mouth daily. For anxiety  . [DISCONTINUED] amoxicillin-clavulanate (AUGMENTIN) 875-125 MG tablet Take 1 tablet by mouth 2 (two) times daily.  . [DISCONTINUED] sertraline (ZOLOFT) 25 MG tablet Take 25 mg by mouth daily.   No facility-administered encounter medications on file as of 02/01/2017.     Review of Systems  Constitutional: Negative for activity change, appetite change, chills, fatigue and fever.  Energy level is fair today.  HENT: Positive for postnasal drip. Negative for congestion and voice change.           Respiratory: Positive for cough. Negative for choking and shortness of breath.        Occasional coughing. On chronic o2 by nasal canula and uses cpap at bedtime.    Cardiovascular: Positive for leg swelling.       Chronic lymphedema BLE  Gastrointestinal: Negative for abdominal distention, abdominal pain and constipation.  Endocrine: Negative for cold intolerance and heat intolerance.  Genitourinary: Negative for difficulty urinating, dysuria, frequency and hematuria.  Musculoskeletal: Positive for gait problem. Negative for neck pain.       Uses motorized wheelchair.   Skin: Negative for pallor and rash.       Chronic skin change BLE due to lymphedema.   Neurological: Negative for tremors, speech difficulty, weakness and headaches.       Has Dizziness with the change of position. has chronic Numbness/tingling in the left arm.  Psychiatric/Behavioral: Negative for  agitation, behavioral problems, confusion and sleep disturbance. The patient is not nervous/anxious.     Immunization History  Administered Date(s) Administered  . Influenza Split 03/20/2011  . Influenza Whole 03/19/2009, 03/19/2012  . Influenza,inj,Quad PF,6+ Mos 03/19/2013  . Influenza-Unspecified 03/03/2014, 03/15/2015, 03/30/2016  . Meningococcal Polysaccharide 03/15/2015  . PPD Test 03/30/2016  . Pneumococcal Conjugate-13 03/12/2014  . Pneumococcal Polysaccharide-23 06/19/2006, 03/15/2015  . Td 10/04/2009  . Zoster 06/19/2010   Pertinent  Health Maintenance Due  Topic Date Due  . INFLUENZA VACCINE  01/17/2017  . DEXA SCAN  06/19/2017 (Originally 09/07/1997)  . PNA vac Low Risk Adult  Completed   Fall Risk  12/22/2016 04/06/2016 11/27/2015 11/18/2015 08/26/2015  Falls in the past year? Yes Yes No No No  Number falls in past yr: 1 1 - - -  Injury with Fall? No Yes - - -  Risk for fall due to : - - - - -   Functional Status Survey:    Vitals:   02/01/17 1621  BP: 138/64  Pulse: 81  Resp: 20  Temp: (!) 97.5 F (36.4 C)  SpO2: 97%  Weight: (!) 313 lb 12.8 oz (142.3 kg)  Height: $Remove'5\' 5"'fUykyCH$  (1.651 m)   Body mass index is 52.22 kg/m. Physical Exam  Constitutional: She is oriented to person, place, and time. She appears well-developed and well-nourished.  Morbidly obese  HENT:  Head: Normocephalic and atraumatic.  Eyes: Pupils are equal, round, and reactive to light. Conjunctivae and EOM are normal.  Neck: Normal range of motion. Neck supple. No thyromegaly present.  Cardiovascular: Normal rate and regular rhythm.   Pulmonary/Chest: Effort normal. She has no wheezes. She has no rales.  Abdominal: Soft. Bowel sounds are normal. She exhibits no distension. There is no tenderness.  Musculoskeletal: Normal range of motion. She exhibits edema.  chronic lymphedema with stasis changes to her legs  Lymphadenopathy:    She has no cervical adenopathy.  Neurological: She is alert and  oriented to person, place, and time. No cranial nerve deficit.  Skin: Skin is warm and dry. No rash noted. No pallor.  Psychiatric: She has a normal mood and affect. Her behavior is normal. Judgment and thought content normal.    Labs reviewed:  Recent Labs  11/02/16 2043 11/26/16 01/12/17 0307 01/18/17  NA 140 138 139 141  K 3.8 4.0 4.0 4.0  CL 101  --  103  --   CO2 33*  --  29  --   GLUCOSE 108*  --  107*  --   BUN 16 22* 14 15  CREATININE 0.94 0.8 0.93 0.9  CALCIUM 7.6*  --  8.6*  --     Recent Labs  11/26/16 01/12/17 0307 01/18/17  AST 14 15 10*  ALT 12 13* 9  ALKPHOS 99 73 77  BILITOT  --  0.7  --   PROT  --  6.5  --   ALBUMIN  --  3.4*  --     Recent Labs  11/02/16 2043 11/26/16 01/12/17 0307 01/18/17  WBC 7.2 6.9 6.1 6.3  NEUTROABS  --   --  4.2  --   HGB 13.0 12.7 12.8 13.0  HCT 39.9 39 38.5 40  MCV 90.5  --  89.1  --   PLT 143* 179 158 142*   Lab Results  Component Value Date   TSH 0.43 10/05/2016   Lab Results  Component Value Date   HGBA1C 6.0 10/05/2016   Lab Results  Component Value Date   CHOL 172 07/29/2014   HDL 42.30 07/29/2014   LDLCALC 98 07/29/2014   LDLDIRECT 99.4 03/13/2012   TRIG 159.0 (H) 07/29/2014   CHOLHDL 4 07/29/2014    Significant Diagnostic Results in last 30 days:  Dg Chest 2 View  Result Date: 01/12/2017 CLINICAL DATA:  Cough. Persistent urinary tract infection despite 4 days of antibiotics. EXAM: CHEST  2 VIEW COMPARISON:  07/26/2015 FINDINGS: Stable mild cardiomegaly. The lungs are clear. Pulmonary vasculature is normal. No pleural effusion. Moderate thoracolumbar scoliosis. Hilar and mediastinal contours are unremarkable and unchanged. IMPRESSION: Stable mild cardiomegaly.  No consolidation or effusion. Electronically Signed   By: Andreas Newport M.D.   On: 01/12/2017 04:06    Assessment/Plan Anxiety and depression She is calmer, smiling, will continue Sertraline 50mg , f/u psych consultation,  observe the  patient.   Essential hypertension Controlled, continue Amlodipine 5mg , Losartan 100mg , HCTZ 12.5mg  daily.   Acid reflux Stable, continue Omeprazole 20mg  qd, prn Zofran  Hypothyroidism Continue Levothyroxine 190mcg qd, last TSH 3.38 01/18/17  UTI (urinary tract infection) Will f/u Urology for further evaluation and treatment.      Family/ staff Communication: plan of care reviewed with the patient and charge nurse.   Labs/tests ordered:  None  Time spend 30 minutes.

## 2017-02-02 NOTE — Addendum Note (Signed)
Addended byBlanchie Serve on: 02/02/2017 04:16 PM   Modules accepted: Level of Service

## 2017-02-05 DIAGNOSIS — M6281 Muscle weakness (generalized): Secondary | ICD-10-CM | POA: Diagnosis not present

## 2017-02-05 DIAGNOSIS — R41841 Cognitive communication deficit: Secondary | ICD-10-CM | POA: Diagnosis not present

## 2017-02-06 ENCOUNTER — Ambulatory Visit (INDEPENDENT_AMBULATORY_CARE_PROVIDER_SITE_OTHER): Payer: Medicare Other | Admitting: Pulmonary Disease

## 2017-02-06 ENCOUNTER — Encounter: Payer: Self-pay | Admitting: Pulmonary Disease

## 2017-02-06 VITALS — BP 102/74 | HR 68 | Ht 65.0 in | Wt 314.6 lb

## 2017-02-06 DIAGNOSIS — Z789 Other specified health status: Secondary | ICD-10-CM | POA: Diagnosis not present

## 2017-02-06 DIAGNOSIS — Z6841 Body Mass Index (BMI) 40.0 and over, adult: Secondary | ICD-10-CM | POA: Diagnosis not present

## 2017-02-06 DIAGNOSIS — R41841 Cognitive communication deficit: Secondary | ICD-10-CM | POA: Diagnosis not present

## 2017-02-06 DIAGNOSIS — E662 Morbid (severe) obesity with alveolar hypoventilation: Secondary | ICD-10-CM

## 2017-02-06 DIAGNOSIS — G4733 Obstructive sleep apnea (adult) (pediatric): Secondary | ICD-10-CM

## 2017-02-06 DIAGNOSIS — M6281 Muscle weakness (generalized): Secondary | ICD-10-CM | POA: Diagnosis not present

## 2017-02-06 NOTE — Patient Instructions (Signed)
Follow up in 1 year.

## 2017-02-06 NOTE — Progress Notes (Signed)
Current Outpatient Prescriptions on File Prior to Visit  Medication Sig  . acetaminophen (TYLENOL) 325 MG tablet Take 650 mg by mouth every 8 (eight) hours as needed for mild pain or moderate pain.   [name] aspirin 81 MG tablet Take 81 mg by mouth daily.   . clotrimazole (LOTRIMIN) 1 % cream Apply 1 application topically 2 (two) times daily. As needed for fungal skin infections  . diazepam (VALIUM) 5 MG tablet Take 5 mg by mouth every 6 (six) hours as needed for anxiety.  . diphenoxylate-atropine (LOMOTIL) 2.5-0.025 MG tablet TAKE 1 TABLET FOUR TIMES DAILY AS NEEDED FOR DIARRHEA OR LOOSE STOOLS.  [name] docusate sodium (COLACE) 100 MG capsule Take 1 capsule (100 mg total) by mouth 2 (two) times daily as needed for mild constipation or moderate constipation.  [name] FIBER PO Take 2 tablets by mouth every morning.   . fluticasone-salmeterol (ADVAIR HFA) 45-21 MCG/ACT inhaler Inhale 1 puff into the lungs 2 (two) times daily as needed.   . hydrochlorothiazide (HYDRODIURIL) 12.5 MG tablet TAKE 1 TABLET ONCE DAILY.  [name] isometheptene-acetaminophen-dichloralphenazone (MIDRIN) 65-100-325 MG capsule Take 1 capsule by mouth 4 (four) times daily as needed for migraine. Maximum 5 capsules in 12 hours for migraine headaches, 8 capsules in 24 hours for tension headaches.  . losartan (COZAAR) 100 MG tablet Take 100 mg by mouth daily. Hold for SBP less than 110.  . omeprazole (PRILOSEC) 20 MG capsule TAKE (1) CAPSULE DAILY.  [name] ondansetron (ZOFRAN) 4 MG tablet Take 1 tablet (4 mg total) by mouth every 6 (six) hours as needed for nausea.  . polyethylene glycol powder (GLYCOLAX/MIRALAX) powder Take 17 g by mouth daily as needed for moderate constipation.  . potassium chloride (MICRO-K) 10 MEQ CR capsule TAKE 4 CAPSULES DAILY FOR POTASSIUM  . sertraline (ZOLOFT) 50 MG tablet Take 50 mg by mouth daily. For anxiety  . amLODipine-valsartan (EXFORGE) 5-160 MG tablet TAKE 1 TABLET ONCE DAILY. (Patient not taking: Reported on 02/06/2017)  .  saccharomyces boulardii (FLORASTOR) 250 MG capsule Take 250 mg by mouth 2 (two) times daily.   No current facility-administered medications on file prior to visit.     Chief Complaint  Patient presents with  . Follow-up    Pt states that her machine works well for her but is not happy with the indention made on her nose by the mask.    Sleep tests PSG 01/22/11 >> AHI 74.3, SpO2 low 76%. ONO with RA and CPAP 04/21/11 >> Test time 5 hr 12 min. Basal SpO2 87%, low SpO2 73%. Spent 161 min with SpO2 < 88% Auto CPAP 05/22/15 to 08/19/15 >> used on 85 of 90 nights with average 6 hrs and 48 min.  Average AHI is 0.8 with median CPAP 5 cm H2O and 95 th percentile CPAP 6 cm H20. ONO with CPAP and RA 09/09/15 >> test time 7 hrs 58 min.  Basal SpO2 91%.  Low SpO2 80%.  Spent 40.7 min with SpO2 < 88%  Cardiac tests Echo 04/01/13 >> EF 55 to 60%, PAS 43 mmHg  Pulmonary tests CT chest 11/03/14 >> atherosclerosis, patchy atelectasis, scoliosis, old Rt fib fx  Past medical history HA, OA, Depression GERD, HLD, HTN, Breast cancer in 2000, Hypothyroidism  Past surgical history, Family history, Social history, Allergies reviewed  Vital signs. BP 102/74 (BP Location: Left Arm, Patient Position: Sitting, Cuff Size: Normal) Comment (BP Location): forearm  Pulse 68   Ht $R'5\' 5"'Jn$  (1.651 m)   Wt (!) 314  lb 9.6 oz (142.7 kg)   SpO2 95%   BMI 52.35 kg/m   History of Present Illness: Tamara Morales is a 83 y.o. female with severe OSA using CPAP and 2 liters oxygen.  She uses CPAP nightly.  She tried doing without it, but her sleep was worse. She is also using oxygen at night.  She has full face mask.  She gets red mark on bridge of her nose.  If she loosens mask, then she has air leak.  Physical Exam:  General - pleasant, sitting in wheelchair Eyes - pupils reactive ENT - no sinus tenderness, no oral exudate, no LAN Cardiac - regular, no murmur Chest - no wheeze, rales Abd - soft, non tender Ext -  2+ edema Skin - no rashes Neuro - normal strength Psych - normal mood  Assessment/Plan:  Obstructive sleep apnea. - she is compliant with CPAP and reports benefit from therapy - continue auto CPAP - discussed how sleep apnea impacts her sleep and importance of continuing CPAP therapy  Plan: - continue auto CPAP  Obesity hypoventilation syndrome. - continue 2 liters oxygen at night with CPAP - discussed how supplemental oxygen at night is beneficial   CPAP full face mask difficulty. - will have her try using band aid over her nose - if still an issue, then consider sending her to sleep lab for mask refitting  Morbid obesity. - discussed options for assisting with weight loss   Patient Instructions  Follow up in 1 year   Chesley Mires, MD [place] Pager:  [phone] 02/06/2017, 4:09 PM

## 2017-02-07 ENCOUNTER — Telehealth: Payer: Self-pay | Admitting: Pulmonary Disease

## 2017-02-07 DIAGNOSIS — M6281 Muscle weakness (generalized): Secondary | ICD-10-CM | POA: Diagnosis not present

## 2017-02-07 DIAGNOSIS — R41841 Cognitive communication deficit: Secondary | ICD-10-CM | POA: Diagnosis not present

## 2017-02-07 NOTE — Assessment & Plan Note (Signed)
Will f/u Urology for further evaluation and treatment.

## 2017-02-07 NOTE — Assessment & Plan Note (Signed)
Controlled, continue Amlodipine 5mg , Losartan 100mg , HCTZ 12.5mg  daily.

## 2017-02-07 NOTE — Telephone Encounter (Signed)
Pt returning Cherina's call from this morning.

## 2017-02-07 NOTE — Assessment & Plan Note (Signed)
Stable, continue Omeprazole 20mg  qd, prn Zofran

## 2017-02-07 NOTE — Telephone Encounter (Signed)
Called and spoke with pt. Pt states she was calling to make VS aware of what strength of Norvasc she takes.   Pt states she take Norvasc $RemoveBefo'5mg'fWykTQFRFrR$  daily. I have added this medication to pt's med list.  Will route to VS to make aware.

## 2017-02-07 NOTE — Telephone Encounter (Signed)
Left message for patient to call back  

## 2017-02-07 NOTE — Assessment & Plan Note (Signed)
Continue Levothyroxine 155mcg qd, last TSH 3.38 01/18/17

## 2017-02-07 NOTE — Telephone Encounter (Signed)
Noted  

## 2017-02-07 NOTE — Assessment & Plan Note (Signed)
She is calmer, smiling, will continue Sertraline 50mg , f/u psych consultation,  observe the patient.

## 2017-02-14 ENCOUNTER — Telehealth: Payer: Self-pay | Admitting: *Deleted

## 2017-02-14 DIAGNOSIS — F419 Anxiety disorder, unspecified: Secondary | ICD-10-CM | POA: Diagnosis not present

## 2017-02-14 DIAGNOSIS — R41841 Cognitive communication deficit: Secondary | ICD-10-CM | POA: Diagnosis not present

## 2017-02-14 DIAGNOSIS — M6281 Muscle weakness (generalized): Secondary | ICD-10-CM | POA: Diagnosis not present

## 2017-02-14 MED ORDER — DIAZEPAM 5 MG PO TABS: 5.0000 mg | ORAL_TABLET | Freq: Four times a day (QID) | ORAL | 0 refills | Status: DC | PRN

## 2017-02-14 MED ORDER — SERTRALINE HCL 50 MG PO TABS: 50.0000 mg | ORAL_TABLET | Freq: Every day | ORAL | 1 refills | Status: DC

## 2017-02-14 MED ORDER — LOSARTAN POTASSIUM 100 MG PO TABS: 100.0000 mg | ORAL_TABLET | Freq: Every day | ORAL | 1 refills | Status: DC

## 2017-02-14 NOTE — Telephone Encounter (Signed)
Received fax from [facility] [phone] requesting refills for patient for: Diazepam $RemoveBefor'5mg'JnJXSnzTXNsu$  Losartan $RemoveB'100mg'azMbFbwf$  Sertraline $RemoveBef'50mg'ZmVXgwiHYD$   [place] and spoke with pharmacist Narda Rutherford.

## 2017-02-14 NOTE — Telephone Encounter (Signed)
Patient called and left message on clinical intake line requesting refills on her medications. I called patient back to get what medications she needed sent to her pharmacy and No Answer, will try again later.

## 2017-02-21 DIAGNOSIS — M545 Low back pain: Secondary | ICD-10-CM | POA: Diagnosis not present

## 2017-02-21 DIAGNOSIS — M25552 Pain in left hip: Secondary | ICD-10-CM | POA: Diagnosis not present

## 2017-02-21 DIAGNOSIS — M6281 Muscle weakness (generalized): Secondary | ICD-10-CM | POA: Diagnosis not present

## 2017-02-21 DIAGNOSIS — M25512 Pain in left shoulder: Secondary | ICD-10-CM | POA: Diagnosis not present

## 2017-02-21 DIAGNOSIS — R2681 Unsteadiness on feet: Secondary | ICD-10-CM | POA: Diagnosis not present

## 2017-02-26 ENCOUNTER — Other Ambulatory Visit: Payer: Self-pay | Admitting: Nurse Practitioner

## 2017-02-26 NOTE — Telephone Encounter (Signed)
Tamara Morales- This refill came across today and it's no longer on her med list. It says you discontinued it in August, but I can't find a note to correlate that. Do you happen to know what happened or if this needs to be refilled? Thanks!

## 2017-02-27 ENCOUNTER — Encounter: Payer: Self-pay | Admitting: *Deleted

## 2017-02-27 NOTE — Telephone Encounter (Signed)
Called resident left message for her to return call to the clinic. To ask if she had been put back on the levothyroxine because it was discontinued in 2017.

## 2017-02-28 ENCOUNTER — Other Ambulatory Visit: Payer: Self-pay | Admitting: *Deleted

## 2017-03-01 ENCOUNTER — Encounter: Payer: Medicare Other | Admitting: Nurse Practitioner

## 2017-03-01 ENCOUNTER — Other Ambulatory Visit: Payer: Self-pay

## 2017-03-01 DIAGNOSIS — K219 Gastro-esophageal reflux disease without esophagitis: Secondary | ICD-10-CM

## 2017-03-01 MED ORDER — OMEPRAZOLE 20 MG PO CPDR: DELAYED_RELEASE_CAPSULE | ORAL | 1 refills | Status: DC

## 2017-03-02 ENCOUNTER — Other Ambulatory Visit: Payer: Self-pay | Admitting: *Deleted

## 2017-03-02 DIAGNOSIS — K219 Gastro-esophageal reflux disease without esophagitis: Secondary | ICD-10-CM

## 2017-03-02 MED ORDER — OMEPRAZOLE 20 MG PO CPDR: DELAYED_RELEASE_CAPSULE | ORAL | 1 refills | Status: DC

## 2017-03-02 NOTE — Telephone Encounter (Signed)
Called patient left message on machine that medication was filled for 90 day supply with [facility]. I informed her that her insurance will not pay for 180 pills of an over the counter medication.

## 2017-03-06 ENCOUNTER — Encounter: Payer: Self-pay | Admitting: Internal Medicine

## 2017-03-06 ENCOUNTER — Non-Acute Institutional Stay: Payer: Medicare Other | Admitting: Internal Medicine

## 2017-03-06 VITALS — BP 118/64 | HR 84 | Temp 98.1°F | Resp 16 | Ht 65.0 in | Wt 311.4 lb

## 2017-03-06 DIAGNOSIS — R059 Cough, unspecified: Secondary | ICD-10-CM

## 2017-03-06 DIAGNOSIS — R600 Localized edema: Secondary | ICD-10-CM

## 2017-03-06 DIAGNOSIS — R05 Cough: Secondary | ICD-10-CM

## 2017-03-06 MED ORDER — TORSEMIDE 20 MG PO TABS: 20.0000 mg | ORAL_TABLET | Freq: Every day | ORAL | 3 refills | Status: DC

## 2017-03-06 MED ORDER — HYDROCHLOROTHIAZIDE 25 MG PO TABS: 25.0000 mg | ORAL_TABLET | Freq: Every day | ORAL | 3 refills | Status: DC

## 2017-03-06 NOTE — Progress Notes (Signed)
Provider:  Blanchie Serve MD  Location:  [facility]   Place of Service:  Clinic (12)  PCP: Blanchie Serve, MD Patient Care Team: Blanchie Serve, MD as PCP - General (Internal Medicine) Josue Hector, MD as Consulting Physician (Cardiology) Azucena Fallen, MD as Consulting Physician (Obstetrics and Gynecology) Chesley Mires, MD as Consulting Physician (Pulmonary Disease) Mast, Man X, NP as Nurse Practitioner (Internal Medicine)  Extended Emergency Contact Information Primary Emergency Contact: Chauncey,Amy Address: [address]          [place], [place] [address] Johnnette Litter of [place] Phone: [phone] Relation: Daughter Secondary Emergency Contact: Boxman,Lee Address: [address]          [place], [place] [address] Johnnette Litter of [place] Phone: [phone] Relation: Daughter  Code Status: Full Code  Goals of Care: Advanced Directive information Advanced Directives 02/01/2017  Does Patient Have a Medical Advance Directive? Yes  Type of Advance Directive [place]  Does patient want to make changes to medical advance directive? No - Patient declined  Copy of [place] in Chart? Yes  Would patient like information on creating a medical advance directive? No - Patient declined  Pre-existing out of facility DNR order (yellow form or pink MOST form) -    Chief Complaint  Patient presents with  . Acute Visit    leg edema and cough  . Medication Refill    No refills needed at this time.    HPI: Patient is a 83 y.o. female seen today for acute visit.  Leg edema- has chronic lymphedema that has worsened over last 7-10 days. She ha noticd fluid from her leg with some raised blisters. Denies leg pain but has discomfort.   Cough- cough that has increased over last 1 week, unable to cough up phlegm but has post nasal drip. Denies chest pain or congestion. Sleeps in her recliner which is unchanged from  her baseline.   GERD- currently on omeprazole 20 mg daily, controlled symptom    Past Medical History:  Diagnosis Date  . ARTHRITIS, KNEES, BILATERAL   . Breast CA ([place]) 2000   s/p R lumpectomy and XRT  . DEPRESSION   . DJD (degenerative joint disease) of knee   . GERD   . HYPERLIPIDEMIA   . HYPERTENSION   . HYPOTHYROIDISM    postsurgical  . Hypoxia   . MIGRAINE HEADACHE   . Morbid obesity ([place])   . OSA (obstructive sleep apnea) 01/17/2011 dx  . Oxygen dependent   . Urge incontinence   . UTI (lower urinary tract infection) 12/2014   Past Surgical History:  Procedure Laterality Date  . ABDOMINAL HYSTERECTOMY    . ADENOIDECTOMY    . BREAST BIOPSY  2000  . BREAST LUMPECTOMY  2000  . CATARACT EXTRACTION    . CHOLECYSTECTOMY  1995  . CYSTOSCOPY/URETEROSCOPY/HOLMIUM LASER Right 06/28/2015   Procedure: CYSTOSCOPY RIGHT RETROGRAD RIGHT URETEROSCOPY/HOLMIUM LASER WITH RIGHT STENT PLACEMENT;  Surgeon: Alexis Frock, MD;  Location: [facility];  Service: Urology;  Laterality: Right;  . PARATHYROIDECTOMY     2-3 removed  . REPLACEMENT TOTAL KNEE BILATERAL  1999, 2005  . Right leg femur repaired    . THYROIDECTOMY    . TONSILLECTOMY    . TUBAL LIGATION      reports that she quit smoking about 42 years ago. Her smoking use included Cigarettes. She has a 0.50 pack-year smoking history. She has never used smokeless tobacco.  She reports that she does not drink alcohol or use drugs. Social History   Social History  . Marital status: Married    Spouse name: N/A  . Number of children: 5  . Years of education: N/A   Occupational History  . RETIRED Retired    worked on office   Social History Main Topics  . Smoking status: Former Smoker    Packs/day: 0.25    Years: 2.00    Types: Cigarettes    Quit date: 06/19/1974  . Smokeless tobacco: Never Used     Comment: Quit in late 20's  . Alcohol use No     Comment: rare  . Drug use: No  . Sexual activity: No   Other Topics Concern  .  Not on file   Social History Narrative   Married to husband that has dementia (he lives in the skilled nursing wing) and this is a big stressor. 5 children. 10 grandchildren. 2 greatgrandchildren. All children [place]      Lives at [facility], in independent living.      Retired from multiple different Community education officer, university, Psychologist, educational.       Hobbies: Management consultant, write poetry, craft dolls    Functional Status Survey:    Family History  Problem Relation Age of Onset  . Emphysema Sister   . Coronary artery disease Neg Hx     Health Maintenance  Topic Date Due  . INFLUENZA VACCINE  03/19/2017 (Originally 01/17/2017)  . DEXA SCAN  06/19/2017 (Originally 09/07/1997)  . TETANUS/TDAP  10/05/2019  . PNA vac Low Risk Adult  Completed    Allergies  Allergen Reactions  . Azithromycin Itching  . Beta Adrenergic Blockers     Depression   . Ciprofloxacin Other (See Comments)    REACTION: edgy and very jumpy   . Codeine Nausea And Vomiting  . Epinephrine Other (See Comments)    REACTION: rapid pulse, sweats  . Klonopin [Clonazepam] Other (See Comments)    Makes her feel very suicidal   . Oxycodone Other (See Comments)    Patient felt like it altered her mental status "Crazy" . Hallucinations later as well.  [name] Paxil [Paroxetine Hydrochloride] Other (See Comments)    Sever depression   . Prednisone     "makes me feel really bad"  . Propoxyphene Hcl Nausea And Vomiting  . Tramadol Other (See Comments)    Feels "jittery"  . Vicodin [Hydrocodone-Acetaminophen] Other (See Comments)    Hallucinations  . Meloxicam Diarrhea  . Vancomycin Rash    Localized rash related to infusion rate.    Outpatient Encounter Prescriptions as of 03/06/2017  Medication Sig  . acetaminophen (TYLENOL) 325 MG tablet Take 650 mg by mouth every 8 (eight) hours as needed for mild pain or moderate pain.   [name] amLODipine (NORVASC) 5 MG tablet Take 5 mg by mouth daily.  [name] aspirin 81 MG tablet Take  81 mg by mouth daily.   . clotrimazole (LOTRIMIN) 1 % cream Apply 1 application topically as needed.  . diazepam (VALIUM) 5 MG tablet Take 5 mg by mouth at bedtime.  . diphenoxylate-atropine (LOMOTIL) 2.5-0.025 MG tablet TAKE 1 TABLET FOUR TIMES DAILY AS NEEDED FOR DIARRHEA OR LOOSE STOOLS.  [name] docusate sodium (COLACE) 100 MG capsule Take 1 capsule (100 mg total) by mouth 2 (two) times daily as needed for mild constipation or moderate constipation.  [name] FIBER PO Take 2 tablets by mouth every morning.   . fluticasone-salmeterol (ADVAIR HFA)  45-21 MCG/ACT inhaler Inhale 1 puff into the lungs 2 (two) times daily as needed.   . isometheptene-acetaminophen-dichloralphenazone (MIDRIN) 65-100-325 MG capsule Take 1 capsule by mouth 4 (four) times daily as needed for migraine. Maximum 5 capsules in 12 hours for migraine headaches, 8 capsules in 24 hours for tension headaches.  . levothyroxine (SYNTHROID, LEVOTHROID) 150 MCG tablet Take 150 mcg by mouth daily before breakfast.  . losartan (COZAAR) 100 MG tablet Take 1 tablet (100 mg total) by mouth daily. Hold for SBP less than 110.  . omeprazole (PRILOSEC) 20 MG capsule TAKE (1) CAPSULE DAILY.  [name] ondansetron (ZOFRAN) 4 MG tablet Take 1 tablet (4 mg total) by mouth every 6 (six) hours as needed for nausea.  . polyethylene glycol (MIRALAX / GLYCOLAX) packet Take 17 g by mouth daily.  . potassium chloride (MICRO-K) 10 MEQ CR capsule TAKE 4 CAPSULES DAILY FOR POTASSIUM  . saccharomyces boulardii (FLORASTOR) 250 MG capsule Take 250 mg by mouth 2 (two) times daily.  . sertraline (ZOLOFT) 50 MG tablet Take 1 tablet (50 mg total) by mouth daily. For anxiety  . [DISCONTINUED] hydrochlorothiazide (HYDRODIURIL) 12.5 MG tablet TAKE 1 TABLET ONCE DAILY.  . hydrochlorothiazide (HYDRODIURIL) 25 MG tablet Take 1 tablet (25 mg total) by mouth daily.  [name] torsemide (DEMADEX) 20 MG tablet Take 1 tablet (20 mg total) by mouth daily.  . [DISCONTINUED] clotrimazole (LOTRIMIN) 1 %  cream Apply 1 application topically 2 (two) times daily. As needed for fungal skin infections  . [DISCONTINUED] diazepam (VALIUM) 5 MG tablet Take 1 tablet (5 mg total) by mouth every 6 (six) hours as needed for anxiety.  . [DISCONTINUED] polyethylene glycol powder (GLYCOLAX/MIRALAX) powder Take 17 g by mouth daily as needed for moderate constipation.  . [DISCONTINUED] SYNTHROID 150 MCG tablet TAKE 1 TABLET ONCE DAILY.   No facility-administered encounter medications on file as of 03/06/2017.     Review of Systems  Constitutional: Positive for fatigue. Negative for chills, diaphoresis and fever.       Energy level is fair today.  HENT: Positive for postnasal drip. Negative for congestion, ear pain, facial swelling, rhinorrhea, sinus pain, sinus pressure, sore throat, tinnitus, trouble swallowing and voice change.           Eyes: Negative for visual disturbance.  Respiratory: Positive for cough and shortness of breath. Negative for chest tightness and wheezing.        Occasional coughing. On chronic o2 by nasal canula and uses cpap at bedtime.    Cardiovascular: Positive for leg swelling. Negative for chest pain and palpitations.  Gastrointestinal: Positive for constipation. Negative for abdominal distention, abdominal pain, blood in stool, nausea and vomiting.       Miralax helps. Last bowel movement was this morning. No blood.   Genitourinary: Positive for frequency. Negative for dysuria, hematuria and pelvic pain.       Urinary incontinence  Musculoskeletal: Positive for gait problem and neck pain.       Uses motorized wheelchair. Self assists for transfers. Chronic Left shoulder/neck pain that resolves some with massage.   Neurological: Negative for dizziness, syncope and light-headedness.       Had a headache last night but resolved now. has Dizziness with the change of position. has chronic Numbness/tingling in the left arm.  Hematological: Does not bruise/bleed easily.    Psychiatric/Behavioral: Negative for confusion.    Vitals:   03/06/17 0937  BP: 118/64  Pulse: 84  Resp: 16  Temp: 98.1 F (36.7 C)  TempSrc: Oral  SpO2: 94%  Weight: (!) 311 lb 6.4 oz (141.3 kg)  Height: $Remove'5\' 5"'RmEGGsF$  (1.651 m)   Body mass index is 51.82 kg/m.   Wt Readings from Last 3 Encounters:  03/06/17 (!) 311 lb 6.4 oz (141.3 kg)  02/06/17 (!) 314 lb 9.6 oz (142.7 kg)  02/01/17 (!) 313 lb 12.8 oz (142.3 kg)   Physical Exam  Constitutional: She is oriented to person, place, and time. No distress.  Morbidly obese  HENT:  Head: Normocephalic and atraumatic.  Mouth/Throat: Oropharynx is clear and moist.  Eyes: Pupils are equal, round, and reactive to light. Conjunctivae and EOM are normal.  Neck: Normal range of motion. Neck supple. No thyromegaly present.  Cardiovascular: Normal rate, regular rhythm and intact distal pulses.   Pulmonary/Chest: Effort normal. No respiratory distress. She has no wheezes. She has rales.  Decreased air entry to lung bases  Abdominal: Soft. Bowel sounds are normal. She exhibits distension. There is no tenderness. There is no rebound and no guarding.  Genitourinary:  Genitourinary Comments: No CVA and suprapubic tenderness  Musculoskeletal: She exhibits edema.  Can move all 4 extremities, lower extremity weakness, on motorized wheelchair, chronic lymphedema with stasis changes to her legs, clear fluid drainage in both legs  Lymphadenopathy:    She has no cervical adenopathy.  Neurological: She is alert and oriented to person, place, and time.  Skin: Skin is warm and dry. No rash noted. She is not diaphoretic.  Erythema to lower legs suggestive of chronic stasis changes to legs, no signs of infection  Psychiatric: She has a normal mood and affect.    Labs reviewed: Basic Metabolic Panel:  Recent Labs  11/02/16 2043 11/26/16 01/12/17 0307 01/18/17  NA 140 138 139 141  K 3.8 4.0 4.0 4.0  CL 101  --  103  --   CO2 33*  --  29  --    GLUCOSE 108*  --  107*  --   BUN 16 22* 14 15  CREATININE 0.94 0.8 0.93 0.9  CALCIUM 7.6*  --  8.6*  --    Liver Function Tests:  Recent Labs  11/26/16 01/12/17 0307 01/18/17  AST 14 15 10*  ALT 12 13* 9  ALKPHOS 99 73 77  BILITOT  --  0.7  --   PROT  --  6.5  --   ALBUMIN  --  3.4*  --    No results for input(s): LIPASE, AMYLASE in the last 8760 hours. No results for input(s): AMMONIA in the last 8760 hours. CBC:  Recent Labs  11/02/16 2043 11/26/16 01/12/17 0307 01/18/17  WBC 7.2 6.9 6.1 6.3  NEUTROABS  --   --  4.2  --   HGB 13.0 12.7 12.8 13.0  HCT 39.9 39 38.5 40  MCV 90.5  --  89.1  --   PLT 143* 179 158 142*   Cardiac Enzymes: No results for input(s): CKTOTAL, CKMB, CKMBINDEX, TROPONINI in the last 8760 hours. BNP: Invalid input(s): POCBNP Lab Results  Component Value Date   HGBA1C 6.0 10/05/2016   Lab Results  Component Value Date   TSH 0.43 10/05/2016   No results found for: VITAMINB12 No results found for: FOLATE No results found for: IRON, TIBC, FERRITIN  Imaging and Procedures obtained prior to SNF admission: Dg Chest 2 View  Result Date: 01/12/2017 CLINICAL DATA:  Cough. Persistent urinary tract infection despite 4 days of antibiotics. EXAM: CHEST  2 VIEW COMPARISON:  07/26/2015 FINDINGS: Stable mild  cardiomegaly. The lungs are clear. Pulmonary vasculature is normal. No pleural effusion. Moderate thoracolumbar scoliosis. Hilar and mediastinal contours are unremarkable and unchanged. IMPRESSION: Stable mild cardiomegaly.  No consolidation or effusion. Electronically Signed   By: Andreas Newport M.D.   On: 01/12/2017 04:06    Assessment/Plan  Leg edema With worsening. Has chronic lymphedema. Concern for fluid overload with crackles at lung bases and weeping legs. Place her on torsemide 20 mg daily. Was on hctz 37.5 mg daily before and currently on 12.5 mg daily. Increase this to 25 mg daily. advised to keep legs elevated. Check bmp in 1 week and  f/u in 2 weeks. if no improvement, obtain echocardiogram to assess systolic function. Advised about side effects of these meds and to monitor for hypotension signs.   Cough Has history of GERD and now has PND. Continue PPI. Possible from fluid overload and diuresis should help. Monitor and reassess if no improvement.    Family/ staff Communication: reviewed care plan with patient  Labs/tests ordered: bmp with gfr   Blanchie Serve, MD Internal Medicine [place], [place] [address] Cell Phone (Monday-Friday 8 am - 5 pm): [phone] On Call: [phone] and follow prompts after 5 pm and on weekends Office Phone: [phone] Office Fax: [phone]

## 2017-03-09 ENCOUNTER — Other Ambulatory Visit: Payer: Self-pay | Admitting: Internal Medicine

## 2017-03-09 ENCOUNTER — Telehealth: Payer: Self-pay | Admitting: *Deleted

## 2017-03-09 NOTE — Telephone Encounter (Signed)
Patient was seen on 9/18 in the clinic and was placed on torsemide. Can we make sure she has picked this script and is taking it. Also explain to pt that this is like furosemide but gentler on her kidney and will help remove fluids.

## 2017-03-09 NOTE — Telephone Encounter (Signed)
Patient called and stated that she is a resident of [facility] and being treated for edema in her lower legs. Stated that she has been looking on the [facility] site and it stated that Lasix was a better drug with moving fluid than HCTZ. Patient wants to try it rather than HCTZ. Also stated that she was getting measured for Compression stockings. Please Advise.

## 2017-03-09 NOTE — Telephone Encounter (Signed)
Tried to call the patient back several times. She doesn't have the option of leaving a voicemail because it isn't setup. Will try to contact later.

## 2017-03-12 NOTE — Telephone Encounter (Signed)
Please have her continue with torsemide until follow up visit next Tuesday where we can discuss further. Thanks.

## 2017-03-12 NOTE — Telephone Encounter (Signed)
Was unable to reach the patient or leave a voicemail. Will try to contact later today before I leave.

## 2017-03-12 NOTE — Telephone Encounter (Signed)
Was unable to leave a voicemail with the patient. Will try to contact later.

## 2017-03-12 NOTE — Telephone Encounter (Signed)
I explained to the patient that per Dr. Bubba Camp torsemide is gentler on her kidneys. Patient does have the torsemide with her. But patient wants to know how would Dr. Bubba Camp feel about her taking Lasix for 6 weeks. Patient feels that she could get rid of more fluid taking the lasix rather than the torsemide

## 2017-03-13 ENCOUNTER — Encounter: Payer: Self-pay | Admitting: Internal Medicine

## 2017-03-13 NOTE — Telephone Encounter (Signed)
Patient came down to the clinic and I explained to her that per Dr. Bubba Camp she needs to take the Torsemide for 2 weeks and that she will be evaluated in clinic next Tuesday.

## 2017-03-15 ENCOUNTER — Encounter: Payer: Self-pay | Admitting: Internal Medicine

## 2017-03-15 DIAGNOSIS — R600 Localized edema: Secondary | ICD-10-CM | POA: Diagnosis not present

## 2017-03-15 LAB — BASIC METABOLIC PANEL WITH GFR
BUN / CREAT RATIO: 27 (calc) — AB (ref 6–22)
BUN: 26 mg/dL — ABNORMAL HIGH (ref 7–25)
CO2: 34 mmol/L — ABNORMAL HIGH (ref 20–32)
CREATININE: 0.98 mg/dL — AB (ref 0.60–0.88)
Calcium: 8.5 mg/dL — ABNORMAL LOW (ref 8.6–10.4)
Chloride: 100 mmol/L (ref 98–110)
GFR, EST NON AFRICAN AMERICAN: 53 mL/min/{1.73_m2} — AB (ref >=60)
GFR, Est African American: 61 mL/min/{1.73_m2} (ref >=60)
Glucose, Bld: 102 mg/dL — ABNORMAL HIGH (ref 65–99)
Potassium: 3.4 mmol/L — ABNORMAL LOW (ref 3.5–5.3)
SODIUM: 142 mmol/L (ref 135–146)

## 2017-03-20 ENCOUNTER — Non-Acute Institutional Stay: Payer: Medicare Other | Admitting: Internal Medicine

## 2017-03-20 ENCOUNTER — Encounter: Payer: Self-pay | Admitting: Internal Medicine

## 2017-03-20 VITALS — BP 118/68 | HR 77 | Temp 97.6°F | Resp 16 | Ht 65.0 in | Wt 317.4 lb

## 2017-03-20 DIAGNOSIS — R0601 Orthopnea: Secondary | ICD-10-CM

## 2017-03-20 DIAGNOSIS — N183 Chronic kidney disease, stage 3 unspecified: Secondary | ICD-10-CM

## 2017-03-20 DIAGNOSIS — R339 Retention of urine, unspecified: Secondary | ICD-10-CM | POA: Diagnosis not present

## 2017-03-20 DIAGNOSIS — I10 Essential (primary) hypertension: Secondary | ICD-10-CM | POA: Diagnosis not present

## 2017-03-20 DIAGNOSIS — I890 Lymphedema, not elsewhere classified: Secondary | ICD-10-CM | POA: Diagnosis not present

## 2017-03-20 MED ORDER — TORSEMIDE 20 MG PO TABS: 20.0000 mg | ORAL_TABLET | Freq: Two times a day (BID) | ORAL | 3 refills | Status: DC

## 2017-03-20 NOTE — Progress Notes (Signed)
Provider:  Blanchie Serve MD  Location:  [facility]   Place of Service:  Clinic (12)  PCP: Blanchie Serve, MD Patient Care Team: Blanchie Serve, MD as PCP - General (Internal Medicine) Josue Hector, MD as Consulting Physician (Cardiology) Azucena Fallen, MD as Consulting Physician (Obstetrics and Gynecology) Chesley Mires, MD as Consulting Physician (Pulmonary Disease) Mast, Man X, NP as Nurse Practitioner (Internal Medicine)  Extended Emergency Contact Information Primary Emergency Contact: Chauncey,Amy Address: [address]          [place], [place] [address] Johnnette Litter of [place] Phone: [phone] Relation: Daughter Secondary Emergency Contact: Boxman,Lee Address: [address]          [place], [place] [address] Johnnette Litter of [place] Phone: [phone] Relation: Daughter  Code Status: Full Code  Goals of Care: Advanced Directive information Advanced Directives 02/01/2017  Does Patient Have a Medical Advance Directive? Yes  Type of Advance Directive [place]  Does patient want to make changes to medical advance directive? No - Patient declined  Copy of [place] in Chart? Yes  Would patient like information on creating a medical advance directive? No - Patient declined  Pre-existing out of facility DNR order (yellow form or pink MOST form) -    Chief Complaint  Patient presents with  . Acute Visit    2 week follow up. Patient stated that weeping from both legs has gotten worse.  . Medication Refill    No refills needed at this time.  [name] Results    Discuss labs    HPI: Patient is a 83 y.o. female seen today for acute visit.  Leg edema- worsened. has chronic lymphedema. Last visit, her hctz was increased to 25 mg daily and she was placed on torsemide 20 mg daily. Has been taking her medication. Has noticed clear fluid drainage from both legs.   Orthopnea- sleeps on her recliner. Denies  chest pain. Has dyspnea with minimal exertion.currently on torsemide and HCTZ. Has sleep apnea and uses CPAP machine.    Past Medical History:  Diagnosis Date  . ARTHRITIS, KNEES, BILATERAL   . Breast CA ([place]) 2000   s/p R lumpectomy and XRT  . DEPRESSION   . DJD (degenerative joint disease) of knee   . GERD   . HYPERLIPIDEMIA   . HYPERTENSION   . HYPOTHYROIDISM    postsurgical  . Hypoxia   . MIGRAINE HEADACHE   . Morbid obesity ([place])   . OSA (obstructive sleep apnea) 01/17/2011 dx  . Oxygen dependent   . Urge incontinence   . UTI (lower urinary tract infection) 12/2014   Past Surgical History:  Procedure Laterality Date  . ABDOMINAL HYSTERECTOMY    . ADENOIDECTOMY    . BREAST BIOPSY  2000  . BREAST LUMPECTOMY  2000  . CATARACT EXTRACTION    . CHOLECYSTECTOMY  1995  . CYSTOSCOPY/URETEROSCOPY/HOLMIUM LASER Right 06/28/2015   Procedure: CYSTOSCOPY RIGHT RETROGRAD RIGHT URETEROSCOPY/HOLMIUM LASER WITH RIGHT STENT PLACEMENT;  Surgeon: Alexis Frock, MD;  Location: [facility];  Service: Urology;  Laterality: Right;  . PARATHYROIDECTOMY     2-3 removed  . REPLACEMENT TOTAL KNEE BILATERAL  1999, 2005  . Right leg femur repaired    . THYROIDECTOMY    . TONSILLECTOMY    . TUBAL LIGATION      reports that she quit smoking about 42 years ago. Her smoking use included Cigarettes. She has a 0.50 pack-year smoking history.  She has never used smokeless tobacco. She reports that she does not drink alcohol or use drugs. Social History   Social History  . Marital status: Married    Spouse name: N/A  . Number of children: 5  . Years of education: N/A   Occupational History  . RETIRED Retired    worked on office   Social History Main Topics  . Smoking status: Former Smoker    Packs/day: 0.25    Years: 2.00    Types: Cigarettes    Quit date: 06/19/1974  . Smokeless tobacco: Never Used     Comment: Quit in late 20's  . Alcohol use No     Comment: rare  . Drug use: No  . Sexual  activity: No   Other Topics Concern  . Not on file   Social History Narrative   Married to husband that has dementia (he lives in the skilled nursing wing) and this is a big stressor. 5 children. 10 grandchildren. 2 greatgrandchildren. All children [place]      Lives at [facility], in independent living.      Retired from multiple different Community education officer, university, Psychologist, educational.       Hobbies: Management consultant, write poetry, craft dolls    Functional Status Survey:    Family History  Problem Relation Age of Onset  . Emphysema Sister   . Coronary artery disease Neg Hx     Health Maintenance  Topic Date Due  . INFLUENZA VACCINE  01/17/2017  . DEXA SCAN  06/19/2017 (Originally 09/07/1997)  . TETANUS/TDAP  10/05/2019  . PNA vac Low Risk Adult  Completed    Allergies  Allergen Reactions  . Azithromycin Itching  . Beta Adrenergic Blockers     Depression   . Ciprofloxacin Other (See Comments)    REACTION: edgy and very jumpy   . Codeine Nausea And Vomiting  . Epinephrine Other (See Comments)    REACTION: rapid pulse, sweats  . Klonopin [Clonazepam] Other (See Comments)    Makes her feel very suicidal   . Oxycodone Other (See Comments)    Patient felt like it altered her mental status "Crazy" . Hallucinations later as well.  [name] Paxil [Paroxetine Hydrochloride] Other (See Comments)    Sever depression   . Prednisone     "makes me feel really bad"  . Propoxyphene Hcl Nausea And Vomiting  . Tramadol Other (See Comments)    Feels "jittery"  . Vicodin [Hydrocodone-Acetaminophen] Other (See Comments)    Hallucinations  . Meloxicam Diarrhea  . Vancomycin Rash    Localized rash related to infusion rate.    Outpatient Encounter Prescriptions as of 03/20/2017  Medication Sig  . acetaminophen (TYLENOL) 325 MG tablet Take 650 mg by mouth every 8 (eight) hours as needed for mild pain or moderate pain.   [name] amLODipine (NORVASC) 5 MG tablet Take 5 mg by mouth daily.  [name]  aspirin 81 MG tablet Take 81 mg by mouth daily.   . clotrimazole (LOTRIMIN) 1 % cream Apply 1 application topically as needed.  . diazepam (VALIUM) 5 MG tablet Take 5 mg by mouth at bedtime.  . diphenoxylate-atropine (LOMOTIL) 2.5-0.025 MG tablet TAKE 1 TABLET FOUR TIMES DAILY AS NEEDED FOR DIARRHEA OR LOOSE STOOLS.  [name] docusate sodium (COLACE) 100 MG capsule Take 1 capsule (100 mg total) by mouth 2 (two) times daily as needed for mild constipation or moderate constipation.  [name] FIBER PO Take 2 tablets by mouth every morning.   [name]  fluticasone-salmeterol (ADVAIR HFA) 45-21 MCG/ACT inhaler Inhale 1 puff into the lungs 2 (two) times daily as needed.   . hydrochlorothiazide (HYDRODIURIL) 25 MG tablet Take 1 tablet (25 mg total) by mouth daily.  [name] isometheptene-acetaminophen-dichloralphenazone (MIDRIN) 65-100-325 MG capsule Take 1 capsule by mouth 4 (four) times daily as needed for migraine. Maximum 5 capsules in 12 hours for migraine headaches, 8 capsules in 24 hours for tension headaches.  . levothyroxine (SYNTHROID, LEVOTHROID) 150 MCG tablet Take 150 mcg by mouth daily before breakfast.  . losartan (COZAAR) 100 MG tablet Take 1 tablet (100 mg total) by mouth daily. Hold for SBP less than 110.  . omeprazole (PRILOSEC) 20 MG capsule TAKE (1) CAPSULE DAILY.  [name] ondansetron (ZOFRAN) 4 MG tablet Take 1 tablet (4 mg total) by mouth every 6 (six) hours as needed for nausea.  . polyethylene glycol (MIRALAX / GLYCOLAX) packet Take 17 g by mouth daily.  . potassium chloride (MICRO-K) 10 MEQ CR capsule TAKE 4 CAPSULES DAILY FOR POTASSIUM  . sertraline (ZOLOFT) 50 MG tablet Take 1 tablet (50 mg total) by mouth daily. For anxiety  . torsemide (DEMADEX) 20 MG tablet Take 1 tablet (20 mg total) by mouth daily.  [name] saccharomyces boulardii (FLORASTOR) 250 MG capsule Take 250 mg by mouth 2 (two) times daily.   No facility-administered encounter medications on file as of 03/20/2017.     Review of Systems    Constitutional: Positive for fatigue. Negative for chills, diaphoresis and fever.       Energy level is fair today.  HENT: Positive for postnasal drip. Negative for congestion, ear pain, facial swelling, rhinorrhea, sinus pain, sinus pressure, sore throat, tinnitus, trouble swallowing and voice change.           Eyes: Negative for visual disturbance.  Respiratory: Positive for cough and shortness of breath. Negative for chest tightness and wheezing.        Occasional coughing. On chronic o2 by nasal canula and uses cpap at bedtime.    Cardiovascular: Positive for leg swelling. Negative for chest pain and palpitations.  Gastrointestinal: Positive for constipation. Negative for abdominal distention, abdominal pain, blood in stool, nausea and vomiting.       Miralax helps. Last bowel movement was this morning. No blood.   Genitourinary: Positive for frequency. Negative for dysuria, hematuria and pelvic pain.       Urinary incontinence  Musculoskeletal: Positive for gait problem and neck pain.       Uses motorized wheelchair. Self assists for transfers. Chronic Left shoulder/neck pain that resolves some with massage.   Neurological: Negative for dizziness, syncope and light-headedness.       Had a headache last night but resolved now. has Dizziness with the change of position. has chronic Numbness/tingling in the left arm.  Hematological: Does not bruise/bleed easily.  Psychiatric/Behavioral: Negative for confusion.    Vitals:   03/20/17 1131  BP: 118/68  Pulse: 77  Resp: 16  Temp: 97.6 F (36.4 C)  TempSrc: Oral  SpO2: 90%  Weight: (!) 317 lb 6.4 oz (144 kg)  Height: '5\' 5"'  (1.651 m)   Body mass index is 52.82 kg/m.   Wt Readings from Last 3 Encounters:  03/20/17 (!) 317 lb 6.4 oz (144 kg)  03/06/17 (!) 311 lb 6.4 oz (141.3 kg)  02/06/17 (!) 314 lb 9.6 oz (142.7 kg)   Physical Exam  Constitutional: She is oriented to person, place, and time. No distress.  Morbidly obese   HENT:  Head:  Normocephalic and atraumatic.  Mouth/Throat: Oropharynx is clear and moist.  Eyes: Pupils are equal, round, and reactive to light. Conjunctivae and EOM are normal.  Neck: Normal range of motion. Neck supple. No thyromegaly present.  Cardiovascular: Normal rate, regular rhythm and intact distal pulses.   Pulmonary/Chest: Effort normal. No respiratory distress. She has no wheezes. She has rales.  Decreased air entry to lung bases  Abdominal: Soft. Bowel sounds are normal. She exhibits distension. There is no tenderness. There is no rebound and no guarding.  Genitourinary:  Genitourinary Comments: No CVA and suprapubic tenderness  Musculoskeletal: She exhibits edema.  Can move all 4 extremities, lower extremity weakness, on motorized wheelchair, chronic lymphedema with stasis changes to her legs, clear fluid drainage in both legs  Lymphadenopathy:    She has no cervical adenopathy.  Neurological: She is alert and oriented to person, place, and time.  Skin: Skin is warm and dry. No rash noted. She is not diaphoretic.  Erythema to lower legs suggestive of chronic stasis changes to legs, no signs of infection  Psychiatric: She has a normal mood and affect.    Labs reviewed: Basic Metabolic Panel:  Recent Labs  11/02/16 2043  01/12/17 0307 01/18/17 03/15/17 0800  NA 140  < > 139 141 142  K 3.8  < > 4.0 4.0 3.4*  CL 101  --  103  --  100  CO2 33*  --  29  --  34*  GLUCOSE 108*  --  107*  --  102*  BUN 16  < > 14 15 26*  CREATININE 0.94  < > 0.93 0.9 0.98*  CALCIUM 7.6*  --  8.6*  --  8.5*  < > = values in this interval not displayed. Liver Function Tests:  Recent Labs  11/26/16 01/12/17 0307 01/18/17  AST 14 15 10*  ALT 12 13* 9  ALKPHOS 99 73 77  BILITOT  --  0.7  --   PROT  --  6.5  --   ALBUMIN  --  3.4*  --    No results for input(s): LIPASE, AMYLASE in the last 8760 hours. No results for input(s): AMMONIA in the last 8760 hours. CBC:  Recent Labs   11/02/16 2043 11/26/16 01/12/17 0307 01/18/17  WBC 7.2 6.9 6.1 6.3  NEUTROABS  --   --  4.2  --   HGB 13.0 12.7 12.8 13.0  HCT 39.9 39 38.5 40  MCV 90.5  --  89.1  --   PLT 143* 179 158 142*   Cardiac Enzymes: No results for input(s): CKTOTAL, CKMB, CKMBINDEX, TROPONINI in the last 8760 hours. BNP: Invalid input(s): POCBNP Lab Results  Component Value Date   HGBA1C 6.0 10/05/2016   Lab Results  Component Value Date   TSH 0.43 10/05/2016   No results found for: VITAMINB12 No results found for: FOLATE No results found for: IRON, TIBC, FERRITIN  Imaging and Procedures obtained prior to SNF admission: Dg Chest 2 View  Result Date: 01/12/2017 CLINICAL DATA:  Cough. Persistent urinary tract infection despite 4 days of antibiotics. EXAM: CHEST  2 VIEW COMPARISON:  07/26/2015 FINDINGS: Stable mild cardiomegaly. The lungs are clear. Pulmonary vasculature is normal. No pleural effusion. Moderate thoracolumbar scoliosis. Hilar and mediastinal contours are unremarkable and unchanged. IMPRESSION: Stable mild cardiomegaly.  No consolidation or effusion. Electronically Signed   By: Andreas Newport M.D.   On: 01/12/2017 04:06    Assessment/Plan  1. CKD (chronic kidney disease) stage 3, GFR 30-59  ml/min ([place]) Reviewed bmp, with going up on her torsemide and being on hctz, monitor bmp - BMP with eGFR; Future  2. Essential hypertension Stable BP. Increase torsemide with her edema and fluid drainage. Continue hctz.   3. Lymphedema of both lower extremities Keep legs elevated at rest- limitation with body habitus. Have placed her on diuretic with her weeping lesion. Will need decongestive therapy and possible intermittent pneumatic compression. Referral to wound care center. Will evaluate her EF. No signs of infection noted.  - AMB referral to wound care center  4. Orthopnea Obtain echocardiogram to assess her systolic function. Salt and fluid restriction for now. Continue torsemide.  -  ECHOCARDIOGRAM COMPLETE; Future   Family/ staff Communication: reviewed care plan with patient and her daughter  Labs/tests ordered: bmp with gfr  Follow up visit- 2 weeks   Blanchie Serve, MD Internal Medicine [address] [place], [place] [address] Cell Phone (Monday-Friday 8 am - 5 pm): [phone] On Call: [phone] and follow prompts after 5 pm and on weekends Office Phone: [phone] Office Fax: [phone]

## 2017-03-22 ENCOUNTER — Encounter: Payer: Self-pay | Admitting: Internal Medicine

## 2017-03-22 ENCOUNTER — Other Ambulatory Visit: Payer: Self-pay | Admitting: Internal Medicine

## 2017-03-22 ENCOUNTER — Telehealth: Payer: Self-pay | Admitting: *Deleted

## 2017-03-22 NOTE — Telephone Encounter (Signed)
Rx called in to [facility]. 

## 2017-03-22 NOTE — Telephone Encounter (Signed)
Ok to refill valium $RemoveBefor'5mg'[id number]$  1 PO BID PRN anxiety? [facility] [address] Please return to me to call into pharmacy. Thanks!

## 2017-03-22 NOTE — Telephone Encounter (Signed)
Yes for quantity 60, 0 refill.

## 2017-03-23 ENCOUNTER — Other Ambulatory Visit: Payer: Self-pay | Admitting: Nurse Practitioner

## 2017-03-23 ENCOUNTER — Other Ambulatory Visit: Payer: Self-pay | Admitting: Internal Medicine

## 2017-03-23 DIAGNOSIS — I10 Essential (primary) hypertension: Secondary | ICD-10-CM

## 2017-03-24 ENCOUNTER — Emergency Department (HOSPITAL_COMMUNITY)
Admission: EM | Admit: 2017-03-24 | Discharge: 2017-03-25 | Disposition: A | Discharge status: Home / Self Care | Payer: Medicare Other | Attending: Emergency Medicine | Admitting: Emergency Medicine

## 2017-03-24 ENCOUNTER — Encounter (HOSPITAL_COMMUNITY): Payer: Self-pay | Admitting: *Deleted

## 2017-03-24 DIAGNOSIS — T50905A Adverse effect of unspecified drugs, medicaments and biological substances, initial encounter: Secondary | ICD-10-CM | POA: Insufficient documentation

## 2017-03-24 DIAGNOSIS — R21 Rash and other nonspecific skin eruption: Secondary | ICD-10-CM | POA: Diagnosis not present

## 2017-03-24 DIAGNOSIS — E039 Hypothyroidism, unspecified: Secondary | ICD-10-CM | POA: Diagnosis not present

## 2017-03-24 DIAGNOSIS — Z87891 Personal history of nicotine dependence: Secondary | ICD-10-CM | POA: Diagnosis not present

## 2017-03-24 DIAGNOSIS — Z96653 Presence of artificial knee joint, bilateral: Secondary | ICD-10-CM | POA: Diagnosis not present

## 2017-03-24 DIAGNOSIS — R0902 Hypoxemia: Secondary | ICD-10-CM | POA: Diagnosis not present

## 2017-03-24 DIAGNOSIS — Z79899 Other long term (current) drug therapy: Secondary | ICD-10-CM | POA: Diagnosis not present

## 2017-03-24 DIAGNOSIS — I129 Hypertensive chronic kidney disease with stage 1 through stage 4 chronic kidney disease, or unspecified chronic kidney disease: Secondary | ICD-10-CM | POA: Insufficient documentation

## 2017-03-24 DIAGNOSIS — T887XXA Unspecified adverse effect of drug or medicament, initial encounter: Secondary | ICD-10-CM | POA: Diagnosis not present

## 2017-03-24 DIAGNOSIS — L299 Pruritus, unspecified: Secondary | ICD-10-CM | POA: Diagnosis not present

## 2017-03-24 DIAGNOSIS — N184 Chronic kidney disease, stage 4 (severe): Secondary | ICD-10-CM | POA: Insufficient documentation

## 2017-03-24 MED ORDER — FAMOTIDINE 20 MG PO TABS
20.0000 mg | ORAL_TABLET | Freq: Once | ORAL | Status: AC
  Administered 2017-03-24: 20 mg via ORAL
  Filled 2017-03-24: qty 1

## 2017-03-24 MED ORDER — FAMOTIDINE 20 MG PO TABS: 20.0000 mg | ORAL_TABLET | Freq: Two times a day (BID) | ORAL | 0 refills | Status: DC

## 2017-03-24 MED ORDER — DIPHENHYDRAMINE HCL 25 MG PO CAPS
25.0000 mg | ORAL_CAPSULE | Freq: Once | ORAL | Status: AC
  Administered 2017-03-24: 25 mg via ORAL
  Filled 2017-03-24: qty 1

## 2017-03-24 MED ORDER — DIPHENHYDRAMINE HCL 25 MG PO TABS: 25.0000 mg | ORAL_TABLET | Freq: Four times a day (QID) | ORAL | 0 refills | Status: DC | PRN

## 2017-03-24 NOTE — Discharge Instructions (Signed)
Please discontinue your Torsemide.  You need to call your doctor tomorrow and find an alternative treatment for your lymphedema, or your symptoms will worsen.  Please return for worsening symptoms, fever, chills, chest pain, or any other symptoms that concern you.

## 2017-03-24 NOTE — ED Provider Notes (Signed)
[facility] DEPT Provider Note   CSN: [id number] Arrival date & time: 03/24/17  2135     History   Chief Complaint No chief complaint on file.   HPI Tamara Morales is a 83 y.o. female.  Patient presents to the ED with a chief complaint of rash.  She states that she has been taking torsemide for lymph edema for the past 2 weeks.  She states that she had done well after the first week, but then her doctor doubled the dose of her torsemide and she states that she developed a rash on her chest and extremities.  She describes the rash as itchy.  She tried taking some claritin and benadryl with mild relief.  She denies any SOB, nausea, vomiting, diarrhea, throat swelling.  She denies any new contacts with potential allergens.  She denies any fevers, chills, or pain.  There are no other associated symptoms.   The history is provided by the patient. No language interpreter was used.    Past Medical History:  Diagnosis Date  . ARTHRITIS, KNEES, BILATERAL   . Breast CA ([place]) 2000   s/p R lumpectomy and XRT  . DEPRESSION   . DJD (degenerative joint disease) of knee   . GERD   . HYPERLIPIDEMIA   . HYPERTENSION   . HYPOTHYROIDISM    postsurgical  . Hypoxia   . MIGRAINE HEADACHE   . Morbid obesity ([place])   . OSA (obstructive sleep apnea) 01/17/2011 dx  . Oxygen dependent   . Urge incontinence   . UTI (lower urinary tract infection) 12/2014    Patient Active Problem List   Diagnosis Date Noted  . Urinary retention 01/22/2017  . Weight gain 12/07/2016  . Neck pain on left side 10/05/2016  . Chronic left shoulder pain 08/10/2016  . Hematuria 08/10/2016  . Acute bronchitis 06/29/2016  . Fat necrosis of breast 05/17/2016  . Numbness and tingling of right leg 04/06/2016  . Contusion of right knee 04/06/2016  . Contusion of right breast 04/06/2016  . Redness of left eye 04/06/2016  . IBS (irritable bowel syndrome) 11/18/2015  . Hypokalemia 07/07/2015  . Acid reflux 07/05/2015   . Lobar pneumonia ([place]) 07/02/2015  . Lymphedema of both lower extremities 07/02/2015  . UTI (urinary tract infection) 07/02/2015  . Encephalopathy   . Ureterolithiasis   . Ureteral stone with hydronephrosis 06/28/2015  . Acute renal failure superimposed on stage 4 chronic kidney disease ([place]) 06/27/2015  . Right ureteral calculus 06/27/2015  . Anxiety and depression 06/27/2015  . History of breast cancer 06/27/2015  . Abdominal pain, RLQ (right lower quadrant) 06/27/2015  . AKI (acute kidney injury) ([place])   . Obesity hypoventilation syndrome ([place]) 11/27/2014  . Orthopnea 04/09/2013  . Cough 03/05/2010  . Morbid obesity ([place]) 10/04/2009  . Hypothyroidism 08/05/2009  . Essential hypertension 08/05/2009    Past Surgical History:  Procedure Laterality Date  . ABDOMINAL HYSTERECTOMY    . ADENOIDECTOMY    . BREAST BIOPSY  2000  . BREAST LUMPECTOMY  2000  . CATARACT EXTRACTION    . CHOLECYSTECTOMY  1995  . CYSTOSCOPY/URETEROSCOPY/HOLMIUM LASER Right 06/28/2015   Procedure: CYSTOSCOPY RIGHT RETROGRAD RIGHT URETEROSCOPY/HOLMIUM LASER WITH RIGHT STENT PLACEMENT;  Surgeon: Alexis Frock, MD;  Location: [facility];  Service: Urology;  Laterality: Right;  . PARATHYROIDECTOMY     2-3 removed  . REPLACEMENT TOTAL KNEE BILATERAL  1999, 2005  . Right leg femur repaired    . THYROIDECTOMY    . TONSILLECTOMY    .  TUBAL LIGATION      OB History    Gravida Para Term Preterm AB Living   5 5           SAB TAB Ectopic Multiple Live Births                   Home Medications    Prior to Admission medications   Medication Sig Start Date End Date Taking? Authorizing Provider  acetaminophen (TYLENOL) 325 MG tablet Take 650 mg by mouth every 8 (eight) hours as needed for mild pain or moderate pain.     [provider]  amLODipine (NORVASC) 5 MG tablet Take 5 mg by mouth daily.    [provider]  aspirin 81 MG tablet Take 81 mg by mouth daily.     [provider]    clotrimazole (LOTRIMIN) 1 % cream Apply 1 application topically as needed.    [provider]  diazepam (VALIUM) 5 MG tablet TAKE 1 TABLET BY MOUTH EVERY 12 HOURS AS NEEDED FOR ANXIETY 03/22/17   Blanchie Serve, MD  diphenoxylate-atropine (LOMOTIL) 2.5-0.025 MG tablet TAKE 1 TABLET FOUR TIMES DAILY AS NEEDED FOR DIARRHEA OR LOOSE STOOLS. 05/22/16   Lauree Chandler, NP  docusate sodium (COLACE) 100 MG capsule Take 1 capsule (100 mg total) by mouth 2 (two) times daily as needed for mild constipation or moderate constipation. 01/09/17   Blanchie Serve, MD  FIBER PO Take 2 tablets by mouth every morning.     [provider]  fluticasone-salmeterol (ADVAIR HFA) 45-21 MCG/ACT inhaler Inhale 1 puff into the lungs 2 (two) times daily as needed.     [provider]  hydrochlorothiazide (HYDRODIURIL) 25 MG tablet TAKE 1 TABLET DAILY. 03/23/17   Blanchie Serve, MD  isometheptene-acetaminophen-dichloralphenazone (MIDRIN) [phone] MG capsule Take 1 capsule by mouth 4 (four) times daily as needed for migraine. Maximum 5 capsules in 12 hours for migraine headaches, 8 capsules in 24 hours for tension headaches.    [provider]  levothyroxine (SYNTHROID, LEVOTHROID) 150 MCG tablet Take 150 mcg by mouth daily before breakfast.    [provider]  losartan (COZAAR) 100 MG tablet Take 1 tablet (100 mg total) by mouth daily. Hold for SBP less than 110. 02/14/17   Bubba Camp, Mahima, MD  omeprazole (PRILOSEC) 20 MG capsule TAKE (1) CAPSULE DAILY. 03/02/17   Mast, Man X, NP  ondansetron (ZOFRAN) 4 MG tablet Take 1 tablet (4 mg total) by mouth every 6 (six) hours as needed for nausea. 02/11/04   Delora Fuel, MD  polyethylene glycol [facility] / Floria Raveling) packet Take 17 g by mouth daily.    [provider]  potassium chloride (MICRO-K) 10 MEQ CR capsule TAKE 4 CAPSULES DAILY FOR POTASSIUM 03/23/17   Blanchie Serve, MD  saccharomyces boulardii (FLORASTOR) 250 MG capsule Take  250 mg by mouth 2 (two) times daily.    [provider]  sertraline (ZOLOFT) 50 MG tablet Take 1 tablet (50 mg total) by mouth daily. For anxiety 02/14/17   Blanchie Serve, MD  torsemide (DEMADEX) 20 MG tablet Take 1 tablet (20 mg total) by mouth 2 (two) times daily. 03/20/17   Blanchie Serve, MD    Family History Family History  Problem Relation Age of Onset  . Emphysema Sister   . Coronary artery disease Neg Hx     Social History Social History  Substance Use Topics  . Smoking status: Former Smoker    Packs/day: 0.25    Years:  2.00    Types: Cigarettes    Quit date: 06/19/1974  . Smokeless tobacco: Never Used     Comment: Quit in late 20's  . Alcohol use No     Comment: rare     Allergies   Azithromycin; Beta adrenergic blockers; Ciprofloxacin; Codeine; Epinephrine; Klonopin [clonazepam]; Oxycodone; Paxil [paroxetine hydrochloride]; Prednisone; Propoxyphene hcl; Tramadol; Vicodin [hydrocodone-acetaminophen]; Meloxicam; and Vancomycin   Review of Systems Review of Systems  All other systems reviewed and are negative.    Physical Exam Updated Vital Signs BP (!) 112/47 (BP Location: Right Arm)   Pulse 87   Temp 97.6 F (36.4 C) (Oral)   Resp 18   SpO2 97%   Physical Exam  Constitutional: She is oriented to person, place, and time. She appears well-developed and well-nourished.  HENT:  Head: Normocephalic and atraumatic.  No oropharyngeal swelling, no stridor, airway intact, normal phonation  Eyes: Pupils are equal, round, and reactive to light. Conjunctivae and EOM are normal.  Neck: Normal range of motion. Neck supple.  Cardiovascular: Normal rate and regular rhythm.  Exam reveals no gallop and no friction rub.   No murmur heard. Pulmonary/Chest: Effort normal and breath sounds normal. No respiratory distress. She has no wheezes. She has no rales. She exhibits no tenderness.  Abdominal: Soft. Bowel sounds are normal. She exhibits no distension and no mass.  There is no tenderness. There is no rebound and no guarding.  Musculoskeletal: Normal range of motion. She exhibits edema. She exhibits no tenderness.  Significant bilateral lower extremity edema, R>L  Neurological: She is alert and oriented to person, place, and time.  Skin: Skin is warm and dry.  Scattered erythematous macules on chest and extremities, no evidence of cellulitis or abscess Candida beneath breasts  Psychiatric: She has a normal mood and affect. Her behavior is normal. Judgment and thought content normal.  Nursing note and vitals reviewed.    ED Treatments / Results  Labs (all labs ordered are listed, but only abnormal results are displayed) Labs Reviewed - No data to display  EKG  EKG Interpretation None       Radiology No results found.  Procedures Procedures (including critical care time)  Medications Ordered in ED Medications - No data to display   Initial Impression / Assessment and Plan / ED Course  I have reviewed the triage vital signs and the nursing notes.  Pertinent labs & imaging results that were available during my care of the patient were reviewed by me and considered in my medical decision making (see chart for details).     Patient with rash on chest and extremities.  Macular in nature and described as pruritic.  Non painful.  No sign of infection, abscess, SJS, TEN.  Onset was after doubling torsemide, and rash worsens after taking the torsemide.  Plan to treat with benadryl, pepcid, and DC torsemide and have patient follow-up with PCP on Monday regarding other options for lymphedema treatment.  Patient seen by and discussed with Dr. Sherry Ruffing, who agrees with plan.  Final Clinical Impressions(s) / ED Diagnoses   Final diagnoses:  Adverse effect of drug, initial encounter    New Prescriptions New Prescriptions   DIPHENHYDRAMINE (BENADRYL) 25 MG TABLET    Take 1 tablet (25 mg total) by mouth every 6 (six) hours as needed for itching  (Rash).   FAMOTIDINE (PEPCID) 20 MG TABLET    Take 1 tablet (20 mg total) by mouth 2 (two) times daily.     Marlon Pel,  Herbie Baltimore, PA-C 03/24/17 2331    Tegeler, Gwenyth Allegra, MD 03/27/17 [phone]

## 2017-03-24 NOTE — ED Notes (Signed)
Bed: WA21 Expected date:  Expected time:  Means of arrival:  Comments: 24f fall

## 2017-03-24 NOTE — ED Triage Notes (Signed)
Pt BIB EMS from [place], for itching d/t increasing dose of Torsemide 2 days ago. She has not taken dose today.  No  Rash or scratch marks assessed by EMS. Pt was given  4L O2 for saturation of 91. Saturation level of 97% on 4L O2. No dyspnea prior to arrival.

## 2017-03-25 ENCOUNTER — Encounter: Payer: Self-pay | Admitting: Internal Medicine

## 2017-03-25 NOTE — ED Notes (Signed)
Attempting to find ride for pt.

## 2017-03-25 NOTE — ED Notes (Addendum)
Pt ambulated to bathroom with assistance of 2 canes. Pt states itching is decreasing. Does not itch as bad.

## 2017-03-26 ENCOUNTER — Telehealth: Payer: Self-pay | Admitting: *Deleted

## 2017-03-26 NOTE — Telephone Encounter (Signed)
Spoke with the patient and she is aware of her appointment tomorrow morning with Dr. Bubba Camp.

## 2017-03-26 NOTE — Telephone Encounter (Signed)
Received fax from [facility] [phone] stating patient needs refill on Hydrochlorothiazide 25mg . Patient stated that the dosage has changed to twice daily =60 tablets.  I cannot find where the dosage changed nor is it in her current medication list. We have documented One tablet once daily. Please Advise.

## 2017-03-26 NOTE — Telephone Encounter (Signed)
She is supposed to take hydrochlorthiazide 25 mg once a day only. Please try to schedule appointment for tomorrow if available.

## 2017-03-27 ENCOUNTER — Telehealth: Payer: Self-pay | Admitting: *Deleted

## 2017-03-27 ENCOUNTER — Encounter: Payer: Self-pay | Admitting: Internal Medicine

## 2017-03-27 ENCOUNTER — Non-Acute Institutional Stay: Payer: Medicare Other | Admitting: Internal Medicine

## 2017-03-27 VITALS — BP 134/64 | HR 99 | Temp 97.9°F | Resp 16 | Ht 65.0 in | Wt 309.4 lb

## 2017-03-27 DIAGNOSIS — R21 Rash and other nonspecific skin eruption: Secondary | ICD-10-CM

## 2017-03-27 DIAGNOSIS — L03115 Cellulitis of right lower limb: Secondary | ICD-10-CM

## 2017-03-27 DIAGNOSIS — I890 Lymphedema, not elsewhere classified: Secondary | ICD-10-CM

## 2017-03-27 DIAGNOSIS — N183 Chronic kidney disease, stage 3 unspecified: Secondary | ICD-10-CM

## 2017-03-27 DIAGNOSIS — F411 Generalized anxiety disorder: Secondary | ICD-10-CM

## 2017-03-27 DIAGNOSIS — R5381 Other malaise: Secondary | ICD-10-CM

## 2017-03-27 MED ORDER — FUROSEMIDE 40 MG PO TABS: 40.0000 mg | ORAL_TABLET | Freq: Two times a day (BID) | ORAL | 3 refills | Status: DC

## 2017-03-27 MED ORDER — DOXYCYCLINE HYCLATE 100 MG PO TABS: 100.0000 mg | ORAL_TABLET | Freq: Two times a day (BID) | ORAL | 0 refills | Status: DC

## 2017-03-27 MED ORDER — SERTRALINE HCL 50 MG PO TABS: ORAL_TABLET | ORAL | 3 refills | Status: DC

## 2017-03-27 MED ORDER — METOLAZONE 2.5 MG PO TABS: 2.5000 mg | ORAL_TABLET | Freq: Every day | ORAL | 3 refills | Status: DC

## 2017-03-27 NOTE — Progress Notes (Signed)
Provider:  Blanchie Serve MD  Location:  [facility]   Place of Service:  Clinic (12)  PCP: Blanchie Serve, MD Patient Care Team: Blanchie Serve, MD as PCP - General (Internal Medicine) Josue Hector, MD as Consulting Physician (Cardiology) Azucena Fallen, MD as Consulting Physician (Obstetrics and Gynecology) Chesley Mires, MD as Consulting Physician (Pulmonary Disease) Mast, Man X, NP as Nurse Practitioner (Internal Medicine)  Extended Emergency Contact Information Primary Emergency Contact: Chauncey,Amy Address: [address]          [place], [place] [address] Johnnette Litter of [place] Phone: [phone] Relation: Daughter Secondary Emergency Contact: Boxman,Lee Address: [place]          [place], [place] [address] Johnnette Litter of [place] Phone: [phone] Mobile Phone: [phone] Relation: Daughter  Code Status: Full Code  Goals of Care: Advanced Directive information Advanced Directives 03/24/2017  Does Patient Have a Medical Advance Directive? No  Type of Advance Directive -  Does patient want to make changes to medical advance directive? -  Copy of [place] in Chart? -  Would patient like information on creating a medical advance directive? No - Patient declined  Pre-existing out of facility DNR order (yellow form or pink MOST form) -    Chief Complaint  Patient presents with  . Acute Visit    Follow up for leg swelling. Patient stated that she has a rash on both of her arms and left thigh. Patient stated that her legs hurt so much.   . Medication Refill    No refills needed at this time    HPI: Patient is a 83 y.o. female seen today for acute visit.  Rash- developed rash after taking torsemide bid and had ED visit for radh. Not taking torsemide now. Taking pepcid and benadryl. Rash has subsided some but has frequent itching.   Leg edema- was prescribed torsemide bid and HCTZ daily last week. She  went up on her HCTZ to bid and has now run out of it. Her leg hurts. She feels her leg swelling has subsided some.   Opening to RLE- as noticed yellow drainge to RLE. Complaints of pain as well. Denies fever or chills.    Past Medical History:  Diagnosis Date  . ARTHRITIS, KNEES, BILATERAL   . Breast CA ([place]) 2000   s/p R lumpectomy and XRT  . DEPRESSION   . DJD (degenerative joint disease) of knee   . GERD   . HYPERLIPIDEMIA   . HYPERTENSION   . HYPOTHYROIDISM    postsurgical  . Hypoxia   . MIGRAINE HEADACHE   . Morbid obesity ([place])   . OSA (obstructive sleep apnea) 01/17/2011 dx  . Oxygen dependent   . Urge incontinence   . UTI (lower urinary tract infection) 12/2014   Past Surgical History:  Procedure Laterality Date  . ABDOMINAL HYSTERECTOMY    . ADENOIDECTOMY    . BREAST BIOPSY  2000  . BREAST LUMPECTOMY  2000  . CATARACT EXTRACTION    . CHOLECYSTECTOMY  1995  . CYSTOSCOPY/URETEROSCOPY/HOLMIUM LASER Right 06/28/2015   Procedure: CYSTOSCOPY RIGHT RETROGRAD RIGHT URETEROSCOPY/HOLMIUM LASER WITH RIGHT STENT PLACEMENT;  Surgeon: Alexis Frock, MD;  Location: [facility];  Service: Urology;  Laterality: Right;  . PARATHYROIDECTOMY     2-3 removed  . REPLACEMENT TOTAL KNEE BILATERAL  1999, 2005  . Right leg femur repaired    . THYROIDECTOMY    . TONSILLECTOMY    .  TUBAL LIGATION      reports that she quit smoking about 42 years ago. Her smoking use included Cigarettes. She has a 0.50 pack-year smoking history. She has never used smokeless tobacco. She reports that she does not drink alcohol or use drugs. Social History   Social History  . Marital status: Married    Spouse name: N/A  . Number of children: 5  . Years of education: N/A   Occupational History  . RETIRED Retired    worked on office   Social History Main Topics  . Smoking status: Former Smoker    Packs/day: 0.25    Years: 2.00    Types: Cigarettes    Quit date: 06/19/1974  . Smokeless tobacco: Never Used       Comment: Quit in late 20's  . Alcohol use No     Comment: rare  . Drug use: No  . Sexual activity: No   Other Topics Concern  . Not on file   Social History Narrative   Married to husband that has dementia (he lives in the skilled nursing wing) and this is a big stressor. 5 children. 10 grandchildren. 2 greatgrandchildren. All children [place]      Lives at [facility], in independent living.      Retired from multiple different Community education officer, university, Psychologist, educational.       Hobbies: Management consultant, write poetry, craft dolls    Functional Status Survey:    Family History  Problem Relation Age of Onset  . Emphysema Sister   . Coronary artery disease Neg Hx     Health Maintenance  Topic Date Due  . INFLUENZA VACCINE  03/28/2017 (Originally 01/17/2017)  . DEXA SCAN  06/19/2017 (Originally 09/07/1997)  . TETANUS/TDAP  10/05/2019  . PNA vac Low Risk Adult  Completed    Allergies  Allergen Reactions  . Azithromycin Itching  . Beta Adrenergic Blockers     Depression   . Ciprofloxacin Other (See Comments)    REACTION: edgy and very jumpy   . Codeine Nausea And Vomiting  . Epinephrine Other (See Comments)    REACTION: rapid pulse, sweats  . Klonopin [Clonazepam] Other (See Comments)    Makes her feel very suicidal   . Oxycodone Other (See Comments)    Patient felt like it altered her mental status "Crazy" . Hallucinations later as well.  [name] Paxil [Paroxetine Hydrochloride] Other (See Comments)    Sever depression   . Prednisone     "makes me feel really bad"  . Propoxyphene Hcl Nausea And Vomiting  . Tramadol Other (See Comments)    Feels "jittery"  . Vicodin [Hydrocodone-Acetaminophen] Other (See Comments)    Hallucinations  . Meloxicam Diarrhea  . Vancomycin Rash    Localized rash related to infusion rate.    Outpatient Encounter Prescriptions as of 03/27/2017  Medication Sig  . acetaminophen (TYLENOL) 325 MG tablet Take 650 mg by mouth every 8 (eight)  hours as needed for mild pain or moderate pain.   [name] amLODipine (NORVASC) 5 MG tablet Take 5 mg by mouth daily.  [name] aspirin 81 MG tablet Take 81 mg by mouth daily.   . clotrimazole (LOTRIMIN) 1 % cream Apply 1 application topically as needed.  . diazepam (VALIUM) 5 MG tablet TAKE 1 TABLET BY MOUTH EVERY 12 HOURS AS NEEDED FOR ANXIETY  . diphenhydrAMINE (BENADRYL) 25 MG tablet Take 1 tablet (25 mg total) by mouth every 6 (six) hours as needed for itching (Rash).  [name]  diphenoxylate-atropine (LOMOTIL) 2.5-0.025 MG tablet TAKE 1 TABLET FOUR TIMES DAILY AS NEEDED FOR DIARRHEA OR LOOSE STOOLS.  [name] docusate sodium (COLACE) 100 MG capsule Take 1 capsule (100 mg total) by mouth 2 (two) times daily as needed for mild constipation or moderate constipation.  . famotidine (PEPCID) 20 MG tablet Take 1 tablet (20 mg total) by mouth 2 (two) times daily.  [name] FIBER PO Take 2 tablets by mouth every morning.   . fluticasone-salmeterol (ADVAIR HFA) 45-21 MCG/ACT inhaler Inhale 1 puff into the lungs 2 (two) times daily as needed.   . hydrochlorothiazide (HYDRODIURIL) 25 MG tablet TAKE 1 TABLET DAILY.  [name] isometheptene-acetaminophen-dichloralphenazone (MIDRIN) 65-100-325 MG capsule Take 1 capsule by mouth 4 (four) times daily as needed for migraine. Maximum 5 capsules in 12 hours for migraine headaches, 8 capsules in 24 hours for tension headaches.  . levothyroxine (SYNTHROID, LEVOTHROID) 150 MCG tablet Take 150 mcg by mouth daily before breakfast.  . losartan (COZAAR) 100 MG tablet Take 1 tablet (100 mg total) by mouth daily. Hold for SBP less than 110.  . omeprazole (PRILOSEC) 20 MG capsule TAKE (1) CAPSULE DAILY.  [name] ondansetron (ZOFRAN) 4 MG tablet Take 1 tablet (4 mg total) by mouth every 6 (six) hours as needed for nausea.  . polyethylene glycol (MIRALAX / GLYCOLAX) packet Take 17 g by mouth daily.  . potassium chloride (MICRO-K) 10 MEQ CR capsule TAKE 4 CAPSULES DAILY FOR POTASSIUM  . sertraline (ZOLOFT) 50 MG tablet  Take 1 tablet (50 mg total) by mouth daily. For anxiety  . saccharomyces boulardii (FLORASTOR) 250 MG capsule Take 250 mg by mouth 2 (two) times daily.  [name] torsemide (DEMADEX) 20 MG tablet Take 1 tablet (20 mg total) by mouth 2 (two) times daily. (Patient not taking: Reported on 03/27/2017)   No facility-administered encounter medications on file as of 03/27/2017.     Review of Systems  Constitutional: Positive for fatigue. Negative for appetite change, chills, diaphoresis and fever.  HENT: Negative for congestion, ear pain, facial swelling, mouth sores, rhinorrhea, sinus pain, sore throat and tinnitus.           Eyes: Negative for visual disturbance.  Respiratory: Positive for cough and shortness of breath. Negative for chest tightness and wheezing.        Occasional coughing. Uses o2 with CPAP at night   Cardiovascular: Positive for leg swelling. Negative for chest pain and palpitations.  Gastrointestinal: Positive for constipation. Negative for abdominal pain, blood in stool, nausea and vomiting.       Miralax helps.    Genitourinary: Negative for dysuria, frequency, hematuria and pelvic pain.       Urinary incontinence  Musculoskeletal: Positive for gait problem and neck pain.       Uses motorized wheelchair. Self assists for transfers.moving around better  Neurological: Negative for dizziness, syncope and light-headedness.  Hematological: Does not bruise/bleed easily.  Psychiatric/Behavioral: Negative for confusion.    Vitals:   03/27/17 1027  BP: 134/64  Pulse: 99  Resp: 16  Temp: 97.9 F (36.6 C)  TempSrc: Oral  SpO2: 92%  Weight: (!) 309 lb 6.4 oz (140.3 kg)  Height: 5\' 5"  (1.651 m)   Body mass index is 51.49 kg/m.   Wt Readings from Last 3 Encounters:  03/27/17 (!) 309 lb 6.4 oz (140.3 kg)  03/20/17 (!) 317 lb 6.4 oz (144 kg)  03/06/17 (!) 311 lb 6.4 oz (141.3 kg)   Physical Exam  Constitutional: She is oriented to person, place,  and time. No distress.    Morbidly obese  HENT:  Head: Normocephalic and atraumatic.  Mouth/Throat: Oropharynx is clear and moist.  Eyes: Conjunctivae and EOM are normal.  Neck: Normal range of motion. Neck supple.  Cardiovascular: Normal rate and regular rhythm.   Pulmonary/Chest: Effort normal. No respiratory distress. She has no wheezes. She has rales.  Decreased air entry to lung bases  Abdominal: Soft. Bowel sounds are normal. She exhibits distension. There is no tenderness. There is no rebound and no guarding.  Musculoskeletal: She exhibits edema.  Can move all 4 extremities, lower extremity weakness, on motorized wheelchair, chronic lymphedema with stasis changes to her legs, clear fluid drainage in both legs  Lymphadenopathy:    She has no cervical adenopathy.  Neurological: She is alert and oriented to person, place, and time.  03/27/17 MMSE 30/30, passed clock drawing.   Skin: Skin is warm and dry. She is not diaphoretic.  Left leg 2 sites of skin breakdown medial side- superior wound scabbed over, inferior wound with moist white tissue that is intact and with clear fluid draining on pressure. Right leg wound with yellow purulent drainage and yellow scab. Erythematous macules to chest, arm and legs scattered, no pustules. Dry skin. No signs of infection. Erythema to lower legs suggestive of chronic stasis changes to legs.   Psychiatric:  anxious    Labs reviewed: Basic Metabolic Panel:  Recent Labs  11/02/16 2043  01/12/17 0307 01/18/17 03/15/17 0800  NA 140  < > 139 141 142  K 3.8  < > 4.0 4.0 3.4*  CL 101  --  103  --  100  CO2 33*  --  29  --  34*  GLUCOSE 108*  --  107*  --  102*  BUN 16  < > 14 15 26*  CREATININE 0.94  < > 0.93 0.9 0.98*  CALCIUM 7.6*  --  8.6*  --  8.5*  < > = values in this interval not displayed. Liver Function Tests:  Recent Labs  11/26/16 01/12/17 0307 01/18/17  AST 14 15 10*  ALT 12 13* 9  ALKPHOS 99 73 77  BILITOT  --  0.7  --   PROT  --  6.5  --    ALBUMIN  --  3.4*  --    No results for input(s): LIPASE, AMYLASE in the last 8760 hours. No results for input(s): AMMONIA in the last 8760 hours. CBC:  Recent Labs  11/02/16 2043 11/26/16 01/12/17 0307 01/18/17  WBC 7.2 6.9 6.1 6.3  NEUTROABS  --   --  4.2  --   HGB 13.0 12.7 12.8 13.0  HCT 39.9 39 38.5 40  MCV 90.5  --  89.1  --   PLT 143* 179 158 142*     Imaging and Procedures obtained prior to SNF admission: Dg Chest 2 View  Result Date: 01/12/2017 CLINICAL DATA:  Cough. Persistent urinary tract infection despite 4 days of antibiotics. EXAM: CHEST  2 VIEW COMPARISON:  07/26/2015 FINDINGS: Stable mild cardiomegaly. The lungs are clear. Pulmonary vasculature is normal. No pleural effusion. Moderate thoracolumbar scoliosis. Hilar and mediastinal contours are unremarkable and unchanged. IMPRESSION: Stable mild cardiomegaly.  No consolidation or effusion. Electronically Signed   By: Andreas Newport M.D.   On: 01/12/2017 04:06    Assessment/Plan  1. Cellulitis of right leg Monitor for fever. Skin care for RLE - doxycycline (VIBRA-TABS) 100 MG tablet; Take 1 tablet (100 mg total) by mouth 2 (two) times  daily.  Dispense: 14 tablet; Refill: 0  2. GAD (generalized anxiety disorder) Increase her zoloft. Monitor her mood.  - sertraline (ZOLOFT) 50 MG tablet; Take one and a half tablet daily for anxiety  Dispense: 30 tablet; Refill: 3  3. Lymphedema Listed torsemide in her allergy list. Start furosemide 40 mg bid for now with metolaone 2.5 mg daily. Pt being transferred to SNF for care. To keep legs elevated at rest. Once infection resolves, will apply compression wrap.  - furosemide (LASIX) 40 MG tablet; Take 1 tablet (40 mg total) by mouth 2 (two) times daily.  Dispense: 30 tablet; Refill: 3 - metolazone (ZAROXOLYN) 2.5 MG tablet; Take 1 tablet (2.5 mg total) by mouth daily.  Dispense: 30 tablet; Refill: 3  4. CKD (chronic kidney disease) stage 3, GFR 30-59 ml/min (HCC) Monitor  BMP with her ckd and being on furosemide  5. Physical deconditioning Will have PT to evaluate and treat for possible therapy to help strengthen her.   6. Rash Resolving. Currently on pepcid and benadryl. D/c pepcid. Prn benadryl for now. Will need skin to be kept moisturized with dry skin. Monitor.    Family/ staff Communication: reviewed care plan with patient and nurse for [place] pt to be transferred to SNF.     Blanchie Serve, MD Internal Medicine [facility] [address] [place], [place] [address] Cell Phone (Monday-Friday 8 am - 5 pm): [phone] On Call: [phone] and follow prompts after 5 pm and on weekends Office Phone: [phone] Office Fax: [phone]

## 2017-03-27 NOTE — Telephone Encounter (Signed)
[facility] called and wanted clarification if patient should be taking HCTZ twice daily. I reviewed chart note and it states Furosemide $RemoveBeforeDEI'40mg'[id number]$  twice daily and Metolazone 2.$RemoveBeforeDE'5mg'[id number]$  once daily. Pharmacy wants to know if she should also be taking the HCTZ once or twice daily. Please Advise.

## 2017-03-27 NOTE — Telephone Encounter (Signed)
hctz is once a day for now. Pt is admitted to SNF so [facility] does not need to send meds for now.

## 2017-03-28 NOTE — Telephone Encounter (Signed)
Tamara Morales at [facility] notified and agreed. Will make note.

## 2017-03-29 ENCOUNTER — Non-Acute Institutional Stay (SKILLED_NURSING_FACILITY): Payer: Medicare Other | Admitting: Internal Medicine

## 2017-03-29 ENCOUNTER — Encounter: Payer: Self-pay | Admitting: Internal Medicine

## 2017-03-29 DIAGNOSIS — L03115 Cellulitis of right lower limb: Secondary | ICD-10-CM

## 2017-03-29 DIAGNOSIS — N184 Chronic kidney disease, stage 4 (severe): Secondary | ICD-10-CM | POA: Diagnosis not present

## 2017-03-29 DIAGNOSIS — F418 Other specified anxiety disorders: Secondary | ICD-10-CM | POA: Diagnosis not present

## 2017-03-29 DIAGNOSIS — I890 Lymphedema, not elsewhere classified: Secondary | ICD-10-CM | POA: Diagnosis not present

## 2017-03-29 DIAGNOSIS — K5909 Other constipation: Secondary | ICD-10-CM | POA: Diagnosis not present

## 2017-03-29 DIAGNOSIS — I10 Essential (primary) hypertension: Secondary | ICD-10-CM

## 2017-03-29 DIAGNOSIS — E031 Congenital hypothyroidism without goiter: Secondary | ICD-10-CM

## 2017-03-29 DIAGNOSIS — L270 Generalized skin eruption due to drugs and medicaments taken internally: Secondary | ICD-10-CM

## 2017-03-29 NOTE — Progress Notes (Signed)
Provider:  Blanchie Serve MD  Location:  [facility] Room Number: 13 Place of Service:  SNF (31)  PCP: Blanchie Serve, MD Patient Care Team: Blanchie Serve, MD as PCP - General (Internal Medicine) Josue Hector, MD as Consulting Physician (Cardiology) Azucena Fallen, MD as Consulting Physician (Obstetrics and Gynecology) Chesley Mires, MD as Consulting Physician (Pulmonary Disease) Mast, Man X, NP as Nurse Practitioner (Internal Medicine)  Extended Emergency Contact Information Primary Emergency Contact: Chauncey,Amy Address: [address]          [place], [place] [address] Johnnette Litter of [place] Phone: [phone] Relation: Daughter Secondary Emergency Contact: Boxman,Lee Address: [address]          [place], [place] [address] Johnnette Litter of [place] Phone: [phone] Mobile Phone: [phone] Relation: Daughter  Code Status: Full Code Goals of Care: Advanced Directive information Advanced Directives 03/29/2017  Does Patient Have a Medical Advance Directive? Yes  Type of Paramedic of [place];Living will  Does patient want to make changes to medical advance directive? No - Patient declined  Copy of [place] in Chart? Yes  Would patient like information on creating a medical advance directive? -  Pre-existing out of facility DNR order (yellow form or pink MOST form) -      Chief Complaint  Patient presents with  . New Admit To SNF    New Admission Visit     HPI: Patient is a 83 y.o. female seen today for admission visit. She resided in independent living and has been transferred to [facility] facility due to her worsening leg edema and need for increased assistance with her ADLs. She is seen in her room today. She has chronic lymphedema, HTN, ckd stage 4, hypothyroidism, morbid obesity among others.   Rash- improving. Complaints of itching to arms and legs.  Cellulitis-  improving, currently on doxycycline  Essential HTN- taking baby aspirin and amlodipine 5 mg daily, losartan 100 mg daily. Also on hctz 25 mg daily.   Anxiety and depression- currently on sertraline 25 mg daily, has some anxiety, pt mentions feeling calmer after being transferred to SNF. On diazepam 5 mg q12h prn  ckd stage 4- denies urinary complaints. On ondansetron 4 mg q6h prn for nausea.  Chronic lymphedema- taking furosemide 40 mg bid with metolazone 2.5 mg daily and tolerating it well. Did not tolerate torsemide well. Has lost 5 lbs since admission.   Hypothyroidism- currently taking levothyroxine 150 mcg daily.   Constipation- taking miralax daily has been helpful.     Past Medical History:  Diagnosis Date  . ARTHRITIS, KNEES, BILATERAL   . Breast CA ([place]) 2000   s/p R lumpectomy and XRT  . DEPRESSION   . DJD (degenerative joint disease) of knee   . GERD   . HYPERLIPIDEMIA   . HYPERTENSION   . HYPOTHYROIDISM    postsurgical  . Hypoxia   . MIGRAINE HEADACHE   . Morbid obesity ([place])   . OSA (obstructive sleep apnea) 01/17/2011 dx  . Oxygen dependent   . Urge incontinence   . UTI (lower urinary tract infection) 12/2014   Past Surgical History:  Procedure Laterality Date  . ABDOMINAL HYSTERECTOMY    . ADENOIDECTOMY    . BREAST BIOPSY  2000  . BREAST LUMPECTOMY  2000  . CATARACT EXTRACTION    . CHOLECYSTECTOMY  1995  . CYSTOSCOPY/URETEROSCOPY/HOLMIUM LASER Right 06/28/2015   Procedure: CYSTOSCOPY RIGHT RETROGRAD RIGHT URETEROSCOPY/HOLMIUM LASER  WITH RIGHT STENT PLACEMENT;  Surgeon: Alexis Frock, MD;  Location: [facility];  Service: Urology;  Laterality: Right;  . PARATHYROIDECTOMY     2-3 removed  . REPLACEMENT TOTAL KNEE BILATERAL  1999, 2005  . Right leg femur repaired    . THYROIDECTOMY    . TONSILLECTOMY    . TUBAL LIGATION      reports that she quit smoking about 42 years ago. Her smoking use included Cigarettes. She has a 0.50 pack-year smoking history. She has  never used smokeless tobacco. She reports that she does not drink alcohol or use drugs. Social History   Social History  . Marital status: Married    Spouse name: N/A  . Number of children: 5  . Years of education: N/A   Occupational History  . RETIRED Retired    worked on office   Social History Main Topics  . Smoking status: Former Smoker    Packs/day: 0.25    Years: 2.00    Types: Cigarettes    Quit date: 06/19/1974  . Smokeless tobacco: Never Used     Comment: Quit in late 20's  . Alcohol use No     Comment: rare  . Drug use: No  . Sexual activity: No   Other Topics Concern  . Not on file   Social History Narrative   Married to husband that has dementia (he lives in the skilled nursing wing) and this is a big stressor. 5 children. 10 grandchildren. 2 greatgrandchildren. All children [place]      Lives at [facility], in independent living.      Retired from multiple different Community education officer, university, Psychologist, educational.       Hobbies: Management consultant, write poetry, craft dolls    Functional Status Survey:    Family History  Problem Relation Age of Onset  . Emphysema Sister   . Coronary artery disease Neg Hx     Health Maintenance  Topic Date Due  . INFLUENZA VACCINE  01/17/2017  . DEXA SCAN  06/19/2017 (Originally 09/07/1997)  . TETANUS/TDAP  10/05/2019  . PNA vac Low Risk Adult  Completed    Allergies  Allergen Reactions  . Azithromycin Itching  . Beta Adrenergic Blockers     Depression   . Ciprofloxacin Other (See Comments)    REACTION: edgy and very jumpy   . Codeine Nausea And Vomiting  . Epinephrine Other (See Comments)    REACTION: rapid pulse, sweats  . Klonopin [Clonazepam] Other (See Comments)    Makes her feel very suicidal   . Oxycodone Other (See Comments)    Patient felt like it altered her mental status "Crazy" . Hallucinations later as well.  [name] Paxil [Paroxetine Hydrochloride] Other (See Comments)    Sever depression   . Prednisone      "makes me feel really bad"  . Propoxyphene Hcl Nausea And Vomiting  . Torsemide   . Tramadol Other (See Comments)    Feels "jittery"  . Vicodin [Hydrocodone-Acetaminophen] Other (See Comments)    Hallucinations  . Meloxicam Diarrhea  . Vancomycin Rash    Localized rash related to infusion rate.    Outpatient Encounter Prescriptions as of 03/29/2017  Medication Sig  . acetaminophen (TYLENOL) 325 MG tablet Take 650 mg by mouth every 8 (eight) hours as needed for mild pain or moderate pain.   [name] amLODipine (NORVASC) 5 MG tablet Take 5 mg by mouth daily.  [name] aspirin 81 MG tablet Take 81 mg by mouth daily.   [name]  diazepam (VALIUM) 5 MG tablet TAKE 1 TABLET BY MOUTH EVERY 12 HOURS AS NEEDED FOR ANXIETY  . diphenhydrAMINE (BENADRYL) 25 MG tablet Take 1 tablet (25 mg total) by mouth every 6 (six) hours as needed for itching (Rash).  . diphenoxylate-atropine (LOMOTIL) 2.5-0.025 MG tablet TAKE 1 TABLET FOUR TIMES DAILY AS NEEDED FOR DIARRHEA OR LOOSE STOOLS.  [name] docusate sodium (COLACE) 100 MG capsule Take 1 capsule (100 mg total) by mouth 2 (two) times daily as needed for mild constipation or moderate constipation.  [name] doxycycline (VIBRA-TABS) 100 MG tablet Take 1 tablet (100 mg total) by mouth 2 (two) times daily.  . furosemide (LASIX) 40 MG tablet Take 1 tablet (40 mg total) by mouth 2 (two) times daily.  . hydrochlorothiazide (HYDRODIURIL) 25 MG tablet TAKE 1 TABLET DAILY.  [name] isometheptene-acetaminophen-dichloralphenazone (MIDRIN) 65-100-325 MG capsule Take 1 capsule by mouth 4 (four) times daily as needed for migraine. Maximum 5 capsules in 12 hours for migraine headaches, 8 capsules in 24 hours for tension headaches.  . levothyroxine (SYNTHROID, LEVOTHROID) 150 MCG tablet Take 150 mcg by mouth daily before breakfast.  . losartan (COZAAR) 100 MG tablet Take 1 tablet (100 mg total) by mouth daily. Hold for SBP less than 110.  . metolazone (ZAROXOLYN) 2.5 MG tablet Take 1 tablet (2.5 mg total) by  mouth daily.  [name] omeprazole (PRILOSEC) 20 MG capsule TAKE (1) CAPSULE DAILY.  [name] ondansetron (ZOFRAN) 4 MG tablet Take 1 tablet (4 mg total) by mouth every 6 (six) hours as needed for nausea.  . polyethylene glycol (MIRALAX / GLYCOLAX) packet Take 17 g by mouth daily.  . potassium chloride (MICRO-K) 10 MEQ CR capsule TAKE 4 CAPSULES DAILY FOR POTASSIUM  . saccharomyces boulardii (FLORASTOR) 250 MG capsule Take 250 mg by mouth 2 (two) times daily.  . sertraline (ZOLOFT) 50 MG tablet Take one and a half tablet daily for anxiety  . [DISCONTINUED] clotrimazole (LOTRIMIN) 1 % cream Apply 1 application topically as needed.  . [DISCONTINUED] FIBER PO Take 2 tablets by mouth every morning.   . [DISCONTINUED] fluticasone-salmeterol (ADVAIR HFA) 45-21 MCG/ACT inhaler Inhale 1 puff into the lungs 2 (two) times daily as needed.    No facility-administered encounter medications on file as of 03/29/2017.     Review of Systems  Constitutional: Negative for appetite change, chills, fatigue and fever.  HENT: Positive for hearing loss. Negative for congestion, mouth sores, sinus pressure and voice change.   Eyes: Negative for visual disturbance.       Wears reading glasses.   Respiratory: Positive for shortness of breath. Negative for cough and wheezing.   Cardiovascular: Positive for leg swelling. Negative for chest pain and palpitations.  Gastrointestinal: Positive for constipation and nausea. Negative for abdominal pain, diarrhea and vomiting.       Occasional nausea but it subsides when zofran is taken. Last bowel movement was this morning.   Genitourinary: Negative for dysuria, flank pain and hematuria.  Musculoskeletal: Positive for arthralgias and gait problem. Negative for back pain.  Skin: Positive for rash and wound.       Positive for itching to both arms.   Neurological: Negative for dizziness, syncope and headaches.  Psychiatric/Behavioral: Negative for behavioral problems.    Vitals:    03/29/17 1059  BP: 122/68  Pulse: 98  Resp: 20  Temp: 98.2 F (36.8 C)  TempSrc: Oral  Weight: (!) 304 lb 14.4 oz (138.3 kg)  Height: $Remove'5\' 5"'yKCNRaT$  (1.651 m)   Body mass index is  50.74 kg/m.   Wt Readings from Last 3 Encounters:  03/29/17 (!) 304 lb 14.4 oz (138.3 kg)  03/27/17 (!) 309 lb 6.4 oz (140.3 kg)  03/20/17 (!) 317 lb 6.4 oz (144 kg)    Physical Exam  Constitutional: She is oriented to person, place, and time. She appears well-developed. No distress.  Morbidly obese  HENT:  Head: Normocephalic and atraumatic.  Mouth/Throat: Oropharynx is clear and moist.  Eyes: Pupils are equal, round, and reactive to light. Conjunctivae and EOM are normal. Right eye exhibits no discharge. Left eye exhibits no discharge.  Neck: Normal range of motion. Neck supple.  Cardiovascular: Normal rate and regular rhythm.   No murmur heard. Pulmonary/Chest: Effort normal and breath sounds normal. She has no wheezes. She has no rales.  Abdominal: Soft. Bowel sounds are normal. She exhibits no distension. There is no guarding.  Musculoskeletal: She exhibits edema.  Can move all 4 extremities, prominent weakness of her legs with limited mobility, chronic lymphedema  Lymphadenopathy:    She has no cervical adenopathy.  Neurological: She is alert and oriented to person, place, and time.  Skin: Skin is warm and dry. Rash noted. She is not diaphoretic. There is erythema.  Erythema to both legs with chronic stasis changes. Open area to RLE has closed with a scab, no active drainage. Serous drainage from both legs. No purulent drainage noted.   Psychiatric: She has a normal mood and affect.  Good eye contact.     Labs reviewed: Basic Metabolic Panel:  Recent Labs  11/02/16 2043  01/12/17 0307 01/18/17 03/15/17 0800  NA 140  < > 139 141 142  K 3.8  < > 4.0 4.0 3.4*  CL 101  --  103  --  100  CO2 33*  --  29  --  34*  GLUCOSE 108*  --  107*  --  102*  BUN 16  < > 14 15 26*  CREATININE 0.94  < >  0.93 0.9 0.98*  CALCIUM 7.6*  --  8.6*  --  8.5*  < > = values in this interval not displayed. Liver Function Tests:  Recent Labs  11/26/16 01/12/17 0307 01/18/17  AST 14 15 10*  ALT 12 13* 9  ALKPHOS 99 73 77  BILITOT  --  0.7  --   PROT  --  6.5  --   ALBUMIN  --  3.4*  --    No results for input(s): LIPASE, AMYLASE in the last 8760 hours. No results for input(s): AMMONIA in the last 8760 hours. CBC:  Recent Labs  11/02/16 2043 11/26/16 01/12/17 0307 01/18/17  WBC 7.2 6.9 6.1 6.3  NEUTROABS  --   --  4.2  --   HGB 13.0 12.7 12.8 13.0  HCT 39.9 39 38.5 40  MCV 90.5  --  89.1  --   PLT 143* 179 158 142*   Cardiac Enzymes: No results for input(s): CKTOTAL, CKMB, CKMBINDEX, TROPONINI in the last 8760 hours. BNP: Invalid input(s): POCBNP Lab Results  Component Value Date   HGBA1C 6.0 10/05/2016   Lab Results  Component Value Date   TSH 0.43 10/05/2016   No results found for: VITAMINB12 No results found for: FOLATE No results found for: IRON, TIBC, FERRITIN  Imaging and Procedures obtained prior to SNF admission: No results found.  Assessment/Plan  Cellulitis Continue and complete course of doxycycline with florastor. Clinical improvement noted. Monitor for fever and cbc curve  Drug rash Thought to be  from torsemide. Currently no signs or symptoms of respiratory compromise. Rash has been resolving but associated itching present. Will have her on triamcinolone cream 0.1% bid along with prn benadryl for now. She has very dry skin and this could be contributing some as well.   Essential HTN Controlled BP. Continue amlodipine, losartan and hctz with baby aspirin for now. Monitor BP  Depression and anxiety Stable at present. Continue sertraline with prn diazepam. Supportive care  ckd atge 4 Monitor BMP with her on lasix and hctz  Chronic lymphedema Continue furosemide 40 mg bid with metolazone 2.5 mg daily. Has lost weight. Will need compression wrap to both  legs upto her knee to help with some of the edema and drainage. To order profore wrap but in the meanwhile, clean legs with NS, apply calcium alginate with silver to open area, then apply barrier cream to surrounding skin and cover with ABD pad and secure with ACE wrap for compression daily. Will obtain follow up with wound care center as well to help with rx for lymphedema  Hypothyroidism Lab Results  Component Value Date   TSH 0.43 10/05/2016   Check TSH. Continue levothyroxine 150 mcg daily.   Constipation Continue miralax daily and prn colace.    Family/ staff Communication: reviewed care plan with patient and charge nurse.    Labs/tests ordered: cbc, cmp, mg, TSH  Blanchie Serve, MD Internal Medicine [facility] [address] [place], [place] [address] Cell Phone (Monday-Friday 8 am - 5 pm): [phone] On Call: [phone] and follow prompts after 5 pm and on weekends Office Phone: [phone] Office Fax: [phone]

## 2017-03-30 DIAGNOSIS — K5909 Other constipation: Secondary | ICD-10-CM | POA: Insufficient documentation

## 2017-03-30 DIAGNOSIS — L03115 Cellulitis of right lower limb: Secondary | ICD-10-CM | POA: Insufficient documentation

## 2017-03-30 DIAGNOSIS — I890 Lymphedema, not elsewhere classified: Secondary | ICD-10-CM | POA: Insufficient documentation

## 2017-03-30 DIAGNOSIS — N184 Chronic kidney disease, stage 4 (severe): Secondary | ICD-10-CM | POA: Insufficient documentation

## 2017-04-03 ENCOUNTER — Encounter: Payer: Medicare Other | Admitting: Internal Medicine

## 2017-04-03 LAB — CBC AND DIFFERENTIAL
HCT: 39 (ref 36–46)
Hemoglobin: 13 (ref 12.0–16.0)
Platelets: 204 (ref 150–399)
WBC: 6.8

## 2017-04-03 LAB — BASIC METABOLIC PANEL
BUN: 25 — AB (ref 4–21)
Creatinine: 1 (ref <=1.1)
GLUCOSE: 90
POTASSIUM: 2.9 — AB (ref 3.4–5.3)
SODIUM: 140 (ref 137–147)

## 2017-04-03 LAB — HEPATIC FUNCTION PANEL
ALT: 10 (ref 7–35)
AST: 16 (ref 13–35)
Alkaline Phosphatase: 86 (ref 25–125)
Bilirubin, Total: 0.6

## 2017-04-03 LAB — TSH: TSH: 5.62 (ref <=5.90)

## 2017-04-04 ENCOUNTER — Encounter: Payer: Self-pay | Admitting: Nurse Practitioner

## 2017-04-04 ENCOUNTER — Non-Acute Institutional Stay (SKILLED_NURSING_FACILITY): Payer: Medicare Other | Admitting: Nurse Practitioner

## 2017-04-04 DIAGNOSIS — E876 Hypokalemia: Secondary | ICD-10-CM | POA: Diagnosis not present

## 2017-04-04 DIAGNOSIS — E031 Congenital hypothyroidism without goiter: Secondary | ICD-10-CM | POA: Diagnosis not present

## 2017-04-04 DIAGNOSIS — I890 Lymphedema, not elsewhere classified: Secondary | ICD-10-CM

## 2017-04-04 NOTE — Assessment & Plan Note (Signed)
BLE edema: improved on Furosemide $RemoveBefor'40mg'[id number]$  bid, HCTZ $RemoveB'25mg'JjkErLLp$  qd. The patient quested if she should reduce her diuretics: my opinion she should continue the same dose of diuretics since her BLE edema and wounds are much better, renal function has no significant change. Her renal function should be checked regularly.

## 2017-04-04 NOTE — Assessment & Plan Note (Addendum)
04/03/17 TSH was mildly elevated 5.62, she takes Levothyroxine 140mcg daily. Patient desires repeat TSH in 4 weeks, then re-eval and adjust Levothyroxine as needed.

## 2017-04-04 NOTE — Progress Notes (Signed)
Location:  [facility] Room Number: 29 Place of Service:  SNF (31) Provider:  Chaun Uemura, Manxie  NP  Blanchie Serve, MD  Patient Care Team: Blanchie Serve, MD as PCP - General (Internal Medicine) Josue Hector, MD as Consulting Physician (Cardiology) Azucena Fallen, MD as Consulting Physician (Obstetrics and Gynecology) Chesley Mires, MD as Consulting Physician (Pulmonary Disease) Chandni Gagan X, NP as Nurse Practitioner (Internal Medicine)  Extended Emergency Contact Information Primary Emergency Contact: Chauncey,Amy Address: [address]          [place], [place] [address] Johnnette Litter of [place] Phone: [phone] Relation: Daughter Secondary Emergency Contact: Boxman,Lee Address: [address]          [place], [place] [address] Johnnette Litter of [place] Phone: [phone] Mobile Phone: [phone] Relation: Daughter  Code Status:  Full Code Goals of care: Advanced Directive information Advanced Directives 04/04/2017  Does Patient Have a Medical Advance Directive? Yes  Type of Paramedic of [place];Living will  Does patient want to make changes to medical advance directive? No - Patient declined  Copy of [place] in Chart? Yes  Would patient like information on creating a medical advance directive? No - Patient declined  Pre-existing out of facility DNR order (yellow form or pink MOST form) -     Chief Complaint  Patient presents with  . Acute Visit    Abnormal labs-> potassium low    HPI:  Pt is a 83 y.o. female seen today for an acute visit for hypokalemia, 04/03/17 serum K 2.9, Kcl subsequently increased to 92meq po daily, f/u CMP scheduled 04/05/17. She denied muscle weakness, palpitation, chest pain/pressure, nausea, vomiting. 04/03/17 TSH was mildly elevated 5.62, she takes Levothyroxine 124mcg daily. BLE edema: improved on Furosemide $RemoveBefor'40mg'wkozIAAMQoFU$  bid, HCTZ $RemoveB'25mg'VzfEXgqE$  qd   Past Medical History:    Diagnosis Date  . ARTHRITIS, KNEES, BILATERAL   . Breast CA ([facility]) 2000   s/p R lumpectomy and XRT  . DEPRESSION   . DJD (degenerative joint disease) of knee   . GERD   . HYPERLIPIDEMIA   . HYPERTENSION   . HYPOTHYROIDISM    postsurgical  . Hypoxia   . MIGRAINE HEADACHE   . Morbid obesity ([facility])   . OSA (obstructive sleep apnea) 01/17/2011 dx  . Oxygen dependent   . Urge incontinence   . UTI (lower urinary tract infection) 12/2014   Past Surgical History:  Procedure Laterality Date  . ABDOMINAL HYSTERECTOMY    . ADENOIDECTOMY    . BREAST BIOPSY  2000  . BREAST LUMPECTOMY  2000  . CATARACT EXTRACTION    . CHOLECYSTECTOMY  1995  . CYSTOSCOPY/URETEROSCOPY/HOLMIUM LASER Right 06/28/2015   Procedure: CYSTOSCOPY RIGHT RETROGRAD RIGHT URETEROSCOPY/HOLMIUM LASER WITH RIGHT STENT PLACEMENT;  Surgeon: Alexis Frock, MD;  Location: [facility];  Service: Urology;  Laterality: Right;  . PARATHYROIDECTOMY     2-3 removed  . REPLACEMENT TOTAL KNEE BILATERAL  1999, 2005  . Right leg femur repaired    . THYROIDECTOMY    . TONSILLECTOMY    . TUBAL LIGATION      Allergies  Allergen Reactions  . Azithromycin Itching  . Beta Adrenergic Blockers     Depression   . Ciprofloxacin Other (See Comments)    REACTION: edgy and very jumpy   . Codeine Nausea And Vomiting  . Epinephrine Other (See Comments)    REACTION: rapid pulse, sweats  . Klonopin [Clonazepam] Other (See Comments)  Makes her feel very suicidal   . Oxycodone Other (See Comments)    Patient felt like it altered her mental status "Crazy" . Hallucinations later as well.  [name] Paxil [Paroxetine Hydrochloride] Other (See Comments)    Sever depression   . Prednisone     "makes me feel really bad"  . Propoxyphene Hcl Nausea And Vomiting  . Torsemide   . Tramadol Other (See Comments)    Feels "jittery"  . Vicodin [Hydrocodone-Acetaminophen] Other (See Comments)    Hallucinations  . Meloxicam Diarrhea  . Vancomycin Rash     Localized rash related to infusion rate.    Outpatient Encounter Prescriptions as of 04/04/2017  Medication Sig  . acetaminophen (TYLENOL) 325 MG tablet Take 650 mg by mouth every 8 (eight) hours as needed for mild pain or moderate pain.   [name] amLODipine (NORVASC) 5 MG tablet Take 5 mg by mouth daily.  [name] aspirin 81 MG tablet Take 81 mg by mouth daily.   . diazepam (VALIUM) 5 MG tablet TAKE 1 TABLET BY MOUTH EVERY 12 HOURS AS NEEDED FOR ANXIETY  . diphenhydrAMINE (BENADRYL) 25 MG tablet Take 1 tablet (25 mg total) by mouth every 6 (six) hours as needed for itching (Rash).  . diphenoxylate-atropine (LOMOTIL) 2.5-0.025 MG tablet TAKE 1 TABLET FOUR TIMES DAILY AS NEEDED FOR DIARRHEA OR LOOSE STOOLS.  [name] docusate sodium (COLACE) 100 MG capsule Take 1 capsule (100 mg total) by mouth 2 (two) times daily as needed for mild constipation or moderate constipation.  . furosemide (LASIX) 40 MG tablet Take 1 tablet (40 mg total) by mouth 2 (two) times daily.  . hydrochlorothiazide (HYDRODIURIL) 25 MG tablet TAKE 1 TABLET DAILY.  [name] isometheptene-acetaminophen-dichloralphenazone (MIDRIN) 65-100-325 MG capsule Take 1 capsule by mouth 4 (four) times daily as needed for migraine. Maximum 5 capsules in 12 hours for migraine headaches, 8 capsules in 24 hours for tension headaches.  . levothyroxine (SYNTHROID, LEVOTHROID) 150 MCG tablet Take 150 mcg by mouth daily before breakfast.  . losartan (COZAAR) 100 MG tablet Take 1 tablet (100 mg total) by mouth daily. Hold for SBP less than 110.  . metolazone (ZAROXOLYN) 2.5 MG tablet Take 1 tablet (2.5 mg total) by mouth daily.  [name] omeprazole (PRILOSEC) 20 MG capsule TAKE (1) CAPSULE DAILY.  [name] ondansetron (ZOFRAN) 4 MG tablet Take 1 tablet (4 mg total) by mouth every 6 (six) hours as needed for nausea.  . polyethylene glycol (MIRALAX / GLYCOLAX) packet Take 17 g by mouth daily.  [name] saccharomyces boulardii (FLORASTOR) 250 MG capsule Take 250 mg by mouth 2 (two) times daily.  .  sertraline (ZOLOFT) 50 MG tablet Take one and a half tablet daily for anxiety  . [DISCONTINUED] doxycycline (VIBRA-TABS) 100 MG tablet Take 1 tablet (100 mg total) by mouth 2 (two) times daily.  . [DISCONTINUED] potassium chloride (MICRO-K) 10 MEQ CR capsule TAKE 4 CAPSULES DAILY FOR POTASSIUM   No facility-administered encounter medications on file as of 04/04/2017.     Review of Systems  Constitutional: Negative for activity change, appetite change, chills, diaphoresis, fatigue and fever.  Respiratory: Negative for cough, choking, shortness of breath and wheezing.   Cardiovascular: Positive for leg swelling. Negative for chest pain and palpitations.  Gastrointestinal: Negative for abdominal distention, abdominal pain, constipation, diarrhea, nausea and vomiting.  Endocrine: Negative for cold intolerance and heat intolerance.  Musculoskeletal: Positive for gait problem. Negative for myalgias.  Skin: Negative for color change, rash and wound.  Neurological: Negative for seizures, syncope,  weakness, numbness and headaches.  Psychiatric/Behavioral: Negative for agitation, behavioral problems, confusion, hallucinations and sleep disturbance. The patient is not nervous/anxious.     Immunization History  Administered Date(s) Administered  . Influenza Split 03/20/2011  . Influenza Whole 03/19/2009, 03/19/2012  . Influenza,inj,Quad PF,6+ Mos 03/19/2013  . Influenza-Unspecified 03/03/2014, 03/15/2015, 03/30/2016  . Meningococcal Polysaccharide 03/15/2015  . PPD Test 03/30/2016  . Pneumococcal Conjugate-13 03/12/2014  . Pneumococcal Polysaccharide-23 06/19/2006, 03/15/2015  . Td 10/04/2009  . Zoster 06/19/2010   Pertinent  Health Maintenance Due  Topic Date Due  . INFLUENZA VACCINE  01/17/2017  . DEXA SCAN  06/19/2017 (Originally 09/07/1997)  . PNA vac Low Risk Adult  Completed   Fall Risk  12/22/2016 04/06/2016 11/27/2015 11/18/2015 08/26/2015  Falls in the past year? Yes Yes No No No  Number  falls in past yr: 1 1 - - -  Injury with Fall? No Yes - - -  Risk for fall due to : - - - - -   Functional Status Survey:    Vitals:   04/04/17 1201  BP: 130/72  Pulse: 92  Resp: 20  Temp: 98.3 F (36.8 C)  SpO2: 93%  Weight: 292 lb (132.5 kg)  Height: $Remove'5\' 5"'rdVmVkD$  (1.651 m)   Body mass index is 48.59 kg/m. Physical Exam  Constitutional: She is oriented to person, place, and time. She appears well-developed and well-nourished.  HENT:  Head: Normocephalic and atraumatic.  Eyes: Pupils are equal, round, and reactive to light. Conjunctivae and EOM are normal.  Neck: Normal range of motion. Neck supple. No thyromegaly present.  Cardiovascular: Normal rate and regular rhythm.   No murmur heard. Pulmonary/Chest: Effort normal and breath sounds normal. She has no wheezes. She has no rales.  Abdominal: Soft. Bowel sounds are normal. There is no tenderness. There is no rebound.  Musculoskeletal: She exhibits edema.  Neurological: She is alert and oriented to person, place, and time. She exhibits normal muscle tone. Coordination normal.  Skin: Skin is warm and dry.  Chronic venous dermatitis BLE  Psychiatric: She has a normal mood and affect. Her behavior is normal.    Labs reviewed:  Recent Labs  11/02/16 2043  01/12/17 0307 01/18/17 03/15/17 0800 04/03/17  NA 140  < > 139 141 142 140  K 3.8  < > 4.0 4.0 3.4* 2.9*  CL 101  --  103  --  100  --   CO2 33*  --  29  --  34*  --   GLUCOSE 108*  --  107*  --  102*  --   BUN 16  < > 14 15 26* 25*  CREATININE 0.94  < > 0.93 0.9 0.98* 1.0  CALCIUM 7.6*  --  8.6*  --  8.5*  --   < > = values in this interval not displayed.  Recent Labs  01/12/17 0307 01/18/17 04/03/17  AST 15 10* 16  ALT 13* 9 10  ALKPHOS 73 77 86  BILITOT 0.7  --   --   PROT 6.5  --   --   ALBUMIN 3.4*  --   --     Recent Labs  11/02/16 2043  01/12/17 0307 01/18/17 04/03/17  WBC 7.2  < > 6.1 6.3 6.8  NEUTROABS  --   --  4.2  --   --   HGB 13.0  < > 12.8  13.0 13.0  HCT 39.9  < > 38.5 40 39  MCV 90.5  --  89.1  --   --  PLT 143*  < > 158 142* 204  < > = values in this interval not displayed. Lab Results  Component Value Date   TSH 5.62 04/03/2017   Lab Results  Component Value Date   HGBA1C 6.0 10/05/2016   Lab Results  Component Value Date   CHOL 172 07/29/2014   HDL 42.30 07/29/2014   LDLCALC 98 07/29/2014   LDLDIRECT 99.4 03/13/2012   TRIG 159.0 (H) 07/29/2014   CHOLHDL 4 07/29/2014    Significant Diagnostic Results in last 30 days:  No results found.  Assessment/Plan Hypothyroidism 04/03/17 TSH was mildly elevated 5.62, she takes Levothyroxine 133mcg daily. Patient desires repeat TSH in 4 weeks, then re-eval and adjust Levothyroxine as needed.   Hypokalemia hypokalemia, 04/03/17 serum K 2.9, Kcl subsequently increased to 30meq po daily, f/u CMP scheduled 04/05/17. She denied muscle weakness, palpitation, chest pain/pressure, nausea, vomiting.  Lymphedema of both lower extremities BLE edema: improved on Furosemide $RemoveBefor'40mg'wAMCddbpVXMF$  bid, HCTZ $RemoveB'25mg'KkLnOrFc$  qd. The patient quested if she should reduce her diuretics: my opinion she should continue the same dose of diuretics since her BLE edema and wounds are much better, renal function has no significant change. Her renal function should be checked regularly.        Family/ staff Communication: plan of care reviewed with the patient and charge nurse  Labs/tests ordered:  TSH 4 weeks, CMP 04/05/17  Time spend 25 minutes

## 2017-04-04 NOTE — Assessment & Plan Note (Signed)
hypokalemia, 04/03/17 serum K 2.9, Kcl subsequently increased to 63meq po daily, f/u CMP scheduled 04/05/17. She denied muscle weakness, palpitation, chest pain/pressure, nausea, vomiting.

## 2017-04-05 ENCOUNTER — Encounter: Payer: Self-pay | Admitting: Internal Medicine

## 2017-04-05 ENCOUNTER — Other Ambulatory Visit: Payer: Self-pay

## 2017-04-05 DIAGNOSIS — N184 Chronic kidney disease, stage 4 (severe): Secondary | ICD-10-CM | POA: Diagnosis not present

## 2017-04-05 MED ORDER — DIAZEPAM 5 MG PO TABS: 2.5000 mg | ORAL_TABLET | Freq: Two times a day (BID) | ORAL | 0 refills | Status: DC | PRN

## 2017-04-06 ENCOUNTER — Encounter: Payer: Self-pay | Admitting: Internal Medicine

## 2017-04-09 ENCOUNTER — Non-Acute Institutional Stay (SKILLED_NURSING_FACILITY): Payer: Medicare Other | Admitting: Nurse Practitioner

## 2017-04-09 ENCOUNTER — Encounter: Payer: Self-pay | Admitting: Nurse Practitioner

## 2017-04-09 DIAGNOSIS — K649 Unspecified hemorrhoids: Secondary | ICD-10-CM | POA: Insufficient documentation

## 2017-04-09 DIAGNOSIS — F418 Other specified anxiety disorders: Secondary | ICD-10-CM

## 2017-04-09 NOTE — Assessment & Plan Note (Signed)
Will try prn Valium 2.$RemoveBefo'5mg'PjkUbCifnlV$  bid prn with at least 4 hours apart, continue Sertraline, observe the patient.

## 2017-04-09 NOTE — Assessment & Plan Note (Signed)
No injury or bleeding, c/o itching, start 1%  Hydrocortisone cream bid prn to anal area, avoid constipation.

## 2017-04-09 NOTE — Progress Notes (Signed)
Location:  [facility] Room Number: 70 Place of Service:  SNF (31) Provider:  Mast, Manxie  NP  Blanchie Serve, MD  Patient Care Team: Blanchie Serve, MD as PCP - General (Internal Medicine) Josue Hector, MD as Consulting Physician (Cardiology) Azucena Fallen, MD as Consulting Physician (Obstetrics and Gynecology) Chesley Mires, MD as Consulting Physician (Pulmonary Disease) Mast, Man X, NP as Nurse Practitioner (Internal Medicine)  Extended Emergency Contact Information Primary Emergency Contact: Chauncey,Amy Address: [address]          [place], [place] [address] Johnnette Litter of [place] Phone: [phone] Relation: Daughter Secondary Emergency Contact: Boxman,Lee Address: [place]          [place], [place] [address] Johnnette Litter of [place] Phone: [phone] Mobile Phone: [phone] Relation: Daughter  Code Status:  Full Code Goals of care: Advanced Directive information Advanced Directives 04/09/2017  Does Patient Have a Medical Advance Directive? Yes  Type of Paramedic of [place];Living will  Does patient want to make changes to medical advance directive? No - Patient declined  Copy of [place] in Chart? Yes  Would patient like information on creating a medical advance directive? -  Pre-existing out of facility DNR order (yellow form or pink MOST form) -     Chief Complaint  Patient presents with  . Acute Visit    anal itching    HPI:  Pt is a 83 y.o. female seen today for an acute visit for anal are itching, stated she has hx of hemorrhoids, no bleeding, denied pain or constipation. Also the patient requested prn Valium 2.$RemoveBefo'5mg'SKrPAvhophc$  q12 can be changed to bid prn with 4 hours apart.    Past Medical History:  Diagnosis Date  . ARTHRITIS, KNEES, BILATERAL   . Breast CA ([facility]) 2000   s/p R lumpectomy and XRT  . DEPRESSION   . DJD (degenerative joint disease) of knee   . GERD     . HYPERLIPIDEMIA   . HYPERTENSION   . HYPOTHYROIDISM    postsurgical  . Hypoxia   . MIGRAINE HEADACHE   . Morbid obesity ([facility])   . OSA (obstructive sleep apnea) 01/17/2011 dx  . Oxygen dependent   . Urge incontinence   . UTI (lower urinary tract infection) 12/2014   Past Surgical History:  Procedure Laterality Date  . ABDOMINAL HYSTERECTOMY    . ADENOIDECTOMY    . BREAST BIOPSY  2000  . BREAST LUMPECTOMY  2000  . CATARACT EXTRACTION    . CHOLECYSTECTOMY  1995  . CYSTOSCOPY/URETEROSCOPY/HOLMIUM LASER Right 06/28/2015   Procedure: CYSTOSCOPY RIGHT RETROGRAD RIGHT URETEROSCOPY/HOLMIUM LASER WITH RIGHT STENT PLACEMENT;  Surgeon: Alexis Frock, MD;  Location: [facility];  Service: Urology;  Laterality: Right;  . PARATHYROIDECTOMY     2-3 removed  . REPLACEMENT TOTAL KNEE BILATERAL  1999, 2005  . Right leg femur repaired    . THYROIDECTOMY    . TONSILLECTOMY    . TUBAL LIGATION      Allergies  Allergen Reactions  . Azithromycin Itching  . Beta Adrenergic Blockers     Depression   . Ciprofloxacin Other (See Comments)    REACTION: edgy and very jumpy   . Codeine Nausea And Vomiting  . Epinephrine Other (See Comments)    REACTION: rapid pulse, sweats  . Klonopin [Clonazepam] Other (See Comments)    Makes her feel very suicidal   . Oxycodone Other (See Comments)    Patient felt  like it altered her mental status "Crazy" . Hallucinations later as well.  [name] Paxil [Paroxetine Hydrochloride] Other (See Comments)    Sever depression   . Prednisone     "makes me feel really bad"  . Propoxyphene Hcl Nausea And Vomiting  . Torsemide   . Tramadol Other (See Comments)    Feels "jittery"  . Vicodin [Hydrocodone-Acetaminophen] Other (See Comments)    Hallucinations  . Meloxicam Diarrhea  . Vancomycin Rash    Localized rash related to infusion rate.    Outpatient Encounter Prescriptions as of 04/09/2017  Medication Sig  . acetaminophen (TYLENOL) 325 MG tablet Take 650 mg by mouth  every 8 (eight) hours as needed for mild pain or moderate pain.   [name] alum & mag hydroxide-simeth (MYLANTA) 200-200-20 MG/5ML suspension Take 30 mLs by mouth every 4 (four) hours as needed for indigestion or heartburn.  [name] amLODipine (NORVASC) 5 MG tablet Take 5 mg by mouth daily.  [name] aspirin 81 MG tablet Take 81 mg by mouth daily.   . diphenhydrAMINE (BENADRYL) 25 MG tablet Take 1 tablet (25 mg total) by mouth every 6 (six) hours as needed for itching (Rash).  . diphenoxylate-atropine (LOMOTIL) 2.5-0.025 MG tablet TAKE 1 TABLET FOUR TIMES DAILY AS NEEDED FOR DIARRHEA OR LOOSE STOOLS.  [name] docusate sodium (COLACE) 100 MG capsule Take 1 capsule (100 mg total) by mouth 2 (two) times daily as needed for mild constipation or moderate constipation.  . furosemide (LASIX) 40 MG tablet Take 1 tablet (40 mg total) by mouth 2 (two) times daily.  . hydrochlorothiazide (HYDRODIURIL) 25 MG tablet TAKE 1 TABLET DAILY.  [name] levothyroxine (SYNTHROID, LEVOTHROID) 150 MCG tablet Take 150 mcg by mouth daily before breakfast.  . losartan (COZAAR) 100 MG tablet Take 1 tablet (100 mg total) by mouth daily. Hold for SBP less than 110.  . metolazone (ZAROXOLYN) 2.5 MG tablet Take 1 tablet (2.5 mg total) by mouth daily.  [name] omeprazole (PRILOSEC) 20 MG capsule TAKE (1) CAPSULE DAILY.  [name] ondansetron (ZOFRAN) 4 MG tablet Take 1 tablet (4 mg total) by mouth every 6 (six) hours as needed for nausea.  . polyethylene glycol (MIRALAX / GLYCOLAX) packet Take 17 g by mouth daily.  . potassium chloride (K-DUR,KLOR-CON) 10 MEQ tablet Take 60 mEq by mouth daily.  [name] saccharomyces boulardii (FLORASTOR) 250 MG capsule Take 250 mg by mouth 2 (two) times daily.  . sertraline (ZOLOFT) 50 MG tablet Take one and a half tablet daily for anxiety  . triamcinolone cream (KENALOG) 0.1 % Apply 1 application topically 2 (two) times daily.  . [DISCONTINUED] diazepam (VALIUM) 5 MG tablet Take 0.5 tablets (2.5 mg total) by mouth every 12 (twelve) hours as  needed (severe anxiety).  . [DISCONTINUED] isometheptene-acetaminophen-dichloralphenazone (MIDRIN) 65-100-325 MG capsule Take 1 capsule by mouth 4 (four) times daily as needed for migraine. Maximum 5 capsules in 12 hours for migraine headaches, 8 capsules in 24 hours for tension headaches.   No facility-administered encounter medications on file as of 04/09/2017.     Review of Systems  Gastrointestinal: Negative for abdominal distention, abdominal pain, constipation, diarrhea, nausea, rectal pain and vomiting.       Hemorrhoids.   Genitourinary: Negative for vaginal bleeding, vaginal discharge and vaginal pain.  Psychiatric/Behavioral: Positive for sleep disturbance. The patient is nervous/anxious.     Immunization History  Administered Date(s) Administered  . Influenza Split 03/20/2011  . Influenza Whole 03/19/2009, 03/19/2012  . Influenza,inj,Quad PF,6+ Mos 03/19/2013  . Influenza-Unspecified 03/03/2014, 03/15/2015,  03/30/2016  . Meningococcal Polysaccharide 03/15/2015  . PPD Test 03/30/2016  . Pneumococcal Conjugate-13 03/12/2014  . Pneumococcal Polysaccharide-23 06/19/2006, 03/15/2015  . Td 10/04/2009  . Zoster 06/19/2010   Pertinent  Health Maintenance Due  Topic Date Due  . INFLUENZA VACCINE  01/17/2017  . DEXA SCAN  06/19/2017 (Originally 09/07/1997)  . PNA vac Low Risk Adult  Completed   Fall Risk  12/22/2016 04/06/2016 11/27/2015 11/18/2015 08/26/2015  Falls in the past year? Yes Yes No No No  Number falls in past yr: 1 1 - - -  Injury with Fall? No Yes - - -  Risk for fall due to : - - - - -   Functional Status Survey:    Vitals:   04/09/17 1442  BP: 128/68  Pulse: 89  Resp: 18  Temp: (!) 97.4 F (36.3 C)  Weight: 292 lb (132.5 kg)  Height: $Remove'5\' 5"'HgfqkVQ$  (1.651 m)   Body mass index is 48.59 kg/m. Physical Exam  Constitutional: She appears well-developed and well-nourished.  Abdominal: Soft. Bowel sounds are normal. She exhibits no distension. There is no tenderness.  There is no rebound and no guarding.  External hemorrhoids around 12 o'clock x2, not injured.   Psychiatric: She has a normal mood and affect. Her behavior is normal. Judgment and thought content normal.    Labs reviewed:  Recent Labs  11/02/16 2043  01/12/17 0307 01/18/17 03/15/17 0800 04/03/17  NA 140  < > 139 141 142 140  K 3.8  < > 4.0 4.0 3.4* 2.9*  CL 101  --  103  --  100  --   CO2 33*  --  29  --  34*  --   GLUCOSE 108*  --  107*  --  102*  --   BUN 16  < > 14 15 26* 25*  CREATININE 0.94  < > 0.93 0.9 0.98* 1.0  CALCIUM 7.6*  --  8.6*  --  8.5*  --   < > = values in this interval not displayed.  Recent Labs  01/12/17 0307 01/18/17 04/03/17  AST 15 10* 16  ALT 13* 9 10  ALKPHOS 73 77 86  BILITOT 0.7  --   --   PROT 6.5  --   --   ALBUMIN 3.4*  --   --     Recent Labs  11/02/16 2043  01/12/17 0307 01/18/17 04/03/17  WBC 7.2  < > 6.1 6.3 6.8  NEUTROABS  --   --  4.2  --   --   HGB 13.0  < > 12.8 13.0 13.0  HCT 39.9  < > 38.5 40 39  MCV 90.5  --  89.1  --   --   PLT 143*  < > 158 142* 204  < > = values in this interval not displayed. Lab Results  Component Value Date   TSH 5.62 04/03/2017   Lab Results  Component Value Date   HGBA1C 6.0 10/05/2016   Lab Results  Component Value Date   CHOL 172 07/29/2014   HDL 42.30 07/29/2014   LDLCALC 98 07/29/2014   LDLDIRECT 99.4 03/13/2012   TRIG 159.0 (H) 07/29/2014   CHOLHDL 4 07/29/2014    Significant Diagnostic Results in last 30 days:  No results found.  Assessment/Plan Hemorrhoids No injury or bleeding, c/o itching, start 1%  Hydrocortisone cream bid prn to anal area, avoid constipation.   Depression with anxiety Will try prn Valium 2.$RemoveBefo'5mg'dcHGNgzCOBR$  bid prn with at least  4 hours apart, continue Sertraline, observe the patient.     Family/ staff Communication: plan of care reviewed with the patient and charge nurse.   Labs/tests ordered:  none  Time spend 25 minutes.

## 2017-04-10 ENCOUNTER — Other Ambulatory Visit: Payer: Self-pay | Admitting: *Deleted

## 2017-04-10 DIAGNOSIS — N184 Chronic kidney disease, stage 4 (severe): Secondary | ICD-10-CM | POA: Diagnosis not present

## 2017-04-10 LAB — BASIC METABOLIC PANEL
BUN: 28 — AB (ref 4–21)
CREATININE: 0.9 (ref <=1.1)
GLUCOSE: 100
POTASSIUM: 3.4 (ref 3.4–5.3)
Sodium: 140 (ref 137–147)

## 2017-04-11 ENCOUNTER — Encounter (HOSPITAL_BASED_OUTPATIENT_CLINIC_OR_DEPARTMENT_OTHER): Payer: Medicare Other | Attending: Surgery

## 2017-04-11 DIAGNOSIS — Z79899 Other long term (current) drug therapy: Secondary | ICD-10-CM | POA: Insufficient documentation

## 2017-04-11 DIAGNOSIS — Z923 Personal history of irradiation: Secondary | ICD-10-CM | POA: Insufficient documentation

## 2017-04-11 DIAGNOSIS — Z96653 Presence of artificial knee joint, bilateral: Secondary | ICD-10-CM | POA: Diagnosis not present

## 2017-04-11 DIAGNOSIS — Z7982 Long term (current) use of aspirin: Secondary | ICD-10-CM | POA: Diagnosis not present

## 2017-04-11 DIAGNOSIS — I129 Hypertensive chronic kidney disease with stage 1 through stage 4 chronic kidney disease, or unspecified chronic kidney disease: Secondary | ICD-10-CM | POA: Diagnosis not present

## 2017-04-11 DIAGNOSIS — Z6841 Body Mass Index (BMI) 40.0 and over, adult: Secondary | ICD-10-CM | POA: Insufficient documentation

## 2017-04-11 DIAGNOSIS — N184 Chronic kidney disease, stage 4 (severe): Secondary | ICD-10-CM | POA: Insufficient documentation

## 2017-04-11 DIAGNOSIS — Z853 Personal history of malignant neoplasm of breast: Secondary | ICD-10-CM | POA: Diagnosis not present

## 2017-04-11 DIAGNOSIS — I872 Venous insufficiency (chronic) (peripheral): Secondary | ICD-10-CM | POA: Diagnosis not present

## 2017-04-11 DIAGNOSIS — I890 Lymphedema, not elsewhere classified: Secondary | ICD-10-CM | POA: Diagnosis not present

## 2017-04-13 DIAGNOSIS — R600 Localized edema: Secondary | ICD-10-CM | POA: Diagnosis not present

## 2017-04-13 DIAGNOSIS — M6281 Muscle weakness (generalized): Secondary | ICD-10-CM | POA: Diagnosis not present

## 2017-04-13 DIAGNOSIS — R531 Weakness: Secondary | ICD-10-CM | POA: Diagnosis not present

## 2017-04-13 DIAGNOSIS — R2681 Unsteadiness on feet: Secondary | ICD-10-CM | POA: Diagnosis not present

## 2017-04-16 ENCOUNTER — Ambulatory Visit (HOSPITAL_COMMUNITY)
Admission: RE | Admit: 2017-04-16 | Discharge: 2017-04-16 | Disposition: A | Discharge status: Home / Self Care | Payer: Medicare Other | Source: Ambulatory Visit | Attending: Surgery | Admitting: Surgery

## 2017-04-16 ENCOUNTER — Other Ambulatory Visit: Payer: Self-pay | Admitting: Surgery

## 2017-04-16 DIAGNOSIS — R600 Localized edema: Secondary | ICD-10-CM | POA: Diagnosis not present

## 2017-04-16 DIAGNOSIS — I872 Venous insufficiency (chronic) (peripheral): Secondary | ICD-10-CM | POA: Diagnosis not present

## 2017-04-17 ENCOUNTER — Non-Acute Institutional Stay (SKILLED_NURSING_FACILITY): Payer: Medicare Other | Admitting: Nurse Practitioner

## 2017-04-17 ENCOUNTER — Encounter: Payer: Self-pay | Admitting: Nurse Practitioner

## 2017-04-17 DIAGNOSIS — K219 Gastro-esophageal reflux disease without esophagitis: Secondary | ICD-10-CM

## 2017-04-17 DIAGNOSIS — K5909 Other constipation: Secondary | ICD-10-CM

## 2017-04-17 DIAGNOSIS — K2900 Acute gastritis without bleeding: Secondary | ICD-10-CM

## 2017-04-17 DIAGNOSIS — N184 Chronic kidney disease, stage 4 (severe): Secondary | ICD-10-CM | POA: Diagnosis not present

## 2017-04-17 DIAGNOSIS — E876 Hypokalemia: Secondary | ICD-10-CM

## 2017-04-17 NOTE — Assessment & Plan Note (Signed)
Stable, continue Omeprazole 20mg po daily.  

## 2017-04-17 NOTE — Assessment & Plan Note (Signed)
Mild, K 3.3 04/17/17, continue Kcl 54meq qd.

## 2017-04-17 NOTE — Progress Notes (Signed)
Location:  [facility] Room Number: 19 Place of Service:  SNF (31) Provider:  Everline Mahaffy, Manxie  NP  Blanchie Serve, MD  Patient Care Team: Blanchie Serve, MD as PCP - General (Internal Medicine) Josue Hector, MD as Consulting Physician (Cardiology) Azucena Fallen, MD as Consulting Physician (Obstetrics and Gynecology) Chesley Mires, MD as Consulting Physician (Pulmonary Disease) Decie Verne X, NP as Nurse Practitioner (Internal Medicine)  Extended Emergency Contact Information Primary Emergency Contact: Chauncey,Amy Address: [address]          [place], [place] [address] Johnnette Litter of [place] Phone: [phone] Relation: Daughter Secondary Emergency Contact: Boxman,Lee Address: [place]          [place], [place] [address] Johnnette Litter of [place] Phone: [phone] Mobile Phone: [phone] Relation: Daughter  Code Status:  Full Code Goals of care: Advanced Directive information Advanced Directives 04/17/2017  Does Patient Have a Medical Advance Directive? Yes  Type of Paramedic of [place];Living will  Does patient want to make changes to medical advance directive? No - Patient declined  Copy of [place] in Chart? Yes  Would patient like information on creating a medical advance directive? No - Patient declined  Pre-existing out of facility DNR order (yellow form or pink MOST form) -     Chief Complaint  Patient presents with  . Acute Visit    Nauseated, loose stools    HPI:  Pt is a 83 y.o. female seen today for an acute visit for nausea, loose stools x3, Lomotil x1 effective 04/16/17, she denied abd pain, vomiting, or fever. 04/17/17 Na 140, K 3.3, Bun 29, creat 1.31. She takes Omeprazole $RemoveBeforeDE'20mg'vehdmwpaSIsqEJI$  qd for GERD, MiraLax daily for constipation, Kcl 34meq for hypokalemia.    Past Medical History:  Diagnosis Date  . ARTHRITIS, KNEES, BILATERAL   . Breast CA ([place]) 2000   s/p R lumpectomy  and XRT  . DEPRESSION   . DJD (degenerative joint disease) of knee   . GERD   . HYPERLIPIDEMIA   . HYPERTENSION   . HYPOTHYROIDISM    postsurgical  . Hypoxia   . MIGRAINE HEADACHE   . Morbid obesity ([place])   . OSA (obstructive sleep apnea) 01/17/2011 dx  . Oxygen dependent   . Urge incontinence   . UTI (lower urinary tract infection) 12/2014   Past Surgical History:  Procedure Laterality Date  . ABDOMINAL HYSTERECTOMY    . ADENOIDECTOMY    . BREAST BIOPSY  2000  . BREAST LUMPECTOMY  2000  . CATARACT EXTRACTION    . CHOLECYSTECTOMY  1995  . CYSTOSCOPY/URETEROSCOPY/HOLMIUM LASER Right 06/28/2015   Procedure: CYSTOSCOPY RIGHT RETROGRAD RIGHT URETEROSCOPY/HOLMIUM LASER WITH RIGHT STENT PLACEMENT;  Surgeon: Alexis Frock, MD;  Location: [facility];  Service: Urology;  Laterality: Right;  . PARATHYROIDECTOMY     2-3 removed  . REPLACEMENT TOTAL KNEE BILATERAL  1999, 2005  . Right leg femur repaired    . THYROIDECTOMY    . TONSILLECTOMY    . TUBAL LIGATION      Allergies  Allergen Reactions  . Azithromycin Itching  . Beta Adrenergic Blockers     Depression   . Ciprofloxacin Other (See Comments)    REACTION: edgy and very jumpy   . Codeine Nausea And Vomiting  . Epinephrine Other (See Comments)    REACTION: rapid pulse, sweats  . Klonopin [Clonazepam] Other (See Comments)    Makes her feel very suicidal   .  Oxycodone Other (See Comments)    Patient felt like it altered her mental status "Crazy" . Hallucinations later as well.  [name] Paxil [Paroxetine Hydrochloride] Other (See Comments)    Sever depression   . Prednisone     "makes me feel really bad"  . Propoxyphene Hcl Nausea And Vomiting  . Torsemide   . Tramadol Other (See Comments)    Feels "jittery"  . Vicodin [Hydrocodone-Acetaminophen] Other (See Comments)    Hallucinations  . Meloxicam Diarrhea  . Vancomycin Rash    Localized rash related to infusion rate.    Outpatient Encounter Prescriptions as of 04/17/2017    Medication Sig  . acetaminophen (TYLENOL) 325 MG tablet Take 650 mg by mouth every 8 (eight) hours as needed for mild pain or moderate pain.   [name] alum & mag hydroxide-simeth (MYLANTA) 200-200-20 MG/5ML suspension Take 30 mLs by mouth every 4 (four) hours as needed for indigestion or heartburn.  [name] amLODipine (NORVASC) 5 MG tablet Take 5 mg by mouth daily.  [name] aspirin 81 MG tablet Take 81 mg by mouth daily.   . diazepam (VALIUM) 5 MG tablet Take 2.5 mg by mouth every 4 (four) hours. for anxiety  . diphenhydrAMINE (BENADRYL) 25 MG tablet Take 1 tablet (25 mg total) by mouth every 6 (six) hours as needed for itching (Rash).  . diphenoxylate-atropine (LOMOTIL) 2.5-0.025 MG tablet TAKE 1 TABLET FOUR TIMES DAILY AS NEEDED FOR DIARRHEA OR LOOSE STOOLS.  [name] docusate sodium (COLACE) 100 MG capsule Take 1 capsule (100 mg total) by mouth 2 (two) times daily as needed for mild constipation or moderate constipation.  . furosemide (LASIX) 40 MG tablet Take 1 tablet (40 mg total) by mouth 2 (two) times daily.  . hydrochlorothiazide (HYDRODIURIL) 25 MG tablet TAKE 1 TABLET DAILY.  [name] levothyroxine (SYNTHROID, LEVOTHROID) 150 MCG tablet Take 150 mcg by mouth daily before breakfast.  . losartan (COZAAR) 100 MG tablet Take 1 tablet (100 mg total) by mouth daily. Hold for SBP less than 110.  . metolazone (ZAROXOLYN) 2.5 MG tablet Take 1 tablet (2.5 mg total) by mouth daily.  [name] omeprazole (PRILOSEC) 20 MG capsule TAKE (1) CAPSULE DAILY.  [name] ondansetron (ZOFRAN) 4 MG tablet Take 1 tablet (4 mg total) by mouth every 6 (six) hours as needed for nausea.  . polyethylene glycol (MIRALAX / GLYCOLAX) packet Take 17 g by mouth daily.  . potassium chloride (K-DUR,KLOR-CON) 10 MEQ tablet Take 60 mEq by mouth daily.  [name] saccharomyces boulardii (FLORASTOR) 250 MG capsule Take 250 mg by mouth 2 (two) times daily.  . sertraline (ZOLOFT) 50 MG tablet Take one and a half tablet daily for anxiety   No facility-administered encounter  medications on file as of 04/17/2017.     Review of Systems  Constitutional: Negative for activity change, appetite change, chills, diaphoresis, fatigue and fever.  Gastrointestinal: Positive for diarrhea and nausea. Negative for abdominal distention, abdominal pain, constipation and vomiting.  Genitourinary: Negative for difficulty urinating and dysuria.    Immunization History  Administered Date(s) Administered  . Influenza Split 03/20/2011  . Influenza Whole 03/19/2009, 03/19/2012  . Influenza,inj,Quad PF,6+ Mos 03/19/2013  . Influenza-Unspecified 03/03/2014, 03/15/2015, 03/30/2016  . Meningococcal Polysaccharide 03/15/2015  . PPD Test 03/30/2016  . Pneumococcal Conjugate-13 03/12/2014  . Pneumococcal Polysaccharide-23 06/19/2006, 03/15/2015  . Td 10/04/2009  . Zoster 06/19/2010   Pertinent  Health Maintenance Due  Topic Date Due  . INFLUENZA VACCINE  01/17/2017  . DEXA SCAN  06/19/2017 (Originally 09/07/1997)  .  PNA vac Low Risk Adult  Completed   Fall Risk  12/22/2016 04/06/2016 11/27/2015 11/18/2015 08/26/2015  Falls in the past year? Yes Yes No No No  Number falls in past yr: 1 1 - - -  Injury with Fall? No Yes - - -  Risk for fall due to : - - - - -   Functional Status Survey:    Vitals:   04/17/17 1026  BP: (!) 150/80  Pulse: 60  Resp: 18  Temp: 98 F (36.7 C)  SpO2: 94%  Weight: 292 lb (132.5 kg)  Height: 5\' 5"  (1.651 m)   Body mass index is 48.59 kg/m. Physical Exam  Constitutional: She is oriented to person, place, and time. She appears well-developed and well-nourished. No distress.  Abdominal: Soft. Bowel sounds are normal. She exhibits no distension. There is no tenderness. There is no rebound and no guarding.  Neurological: She is alert and oriented to person, place, and time.  Skin: She is not diaphoretic.  Psychiatric: She has a normal mood and affect. Her behavior is normal.    Labs reviewed:  Recent Labs  11/02/16 2043  01/12/17 0307   03/15/17 0800 04/03/17 04/10/17  NA 140  < > 139  < > 142 140 140  K 3.8  < > 4.0  < > 3.4* 2.9* 3.4  CL 101  --  103  --  100  --   --   CO2 33*  --  29  --  34*  --   --   GLUCOSE 108*  --  107*  --  102*  --   --   BUN 16  < > 14  < > 26* 25* 28*  CREATININE 0.94  < > 0.93  < > 0.98* 1.0 0.9  CALCIUM 7.6*  --  8.6*  --  8.5*  --   --   < > = values in this interval not displayed.  Recent Labs  01/12/17 0307 01/18/17 04/03/17  AST 15 10* 16  ALT 13* 9 10  ALKPHOS 73 77 86  BILITOT 0.7  --   --   PROT 6.5  --   --   ALBUMIN 3.4*  --   --     Recent Labs  11/02/16 2043  01/12/17 0307 01/18/17 04/03/17  WBC 7.2  < > 6.1 6.3 6.8  NEUTROABS  --   --  4.2  --   --   HGB 13.0  < > 12.8 13.0 13.0  HCT 39.9  < > 38.5 40 39  MCV 90.5  --  89.1  --   --   PLT 143*  < > 158 142* 204  < > = values in this interval not displayed. Lab Results  Component Value Date   TSH 5.62 04/03/2017   Lab Results  Component Value Date   HGBA1C 6.0 10/05/2016   Lab Results  Component Value Date   CHOL 172 07/29/2014   HDL 42.30 07/29/2014   LDLCALC 98 07/29/2014   LDLDIRECT 99.4 03/13/2012   TRIG 159.0 (H) 07/29/2014   CHOLHDL 4 07/29/2014    Significant Diagnostic Results in last 30 days:  No results found.  Assessment/Plan Acid reflux Stable, continue Omeprazole 20mg  po daily  Chronic constipation Continue daily MiraLax, hold for loose stools or diarrhea.   Hypokalemia Mild, K 3.3 04/17/17, continue Kcl 91meq qd.   Acute gastritis Not certain of etiology, ? irritation of meds/food, resolved today, 04/17/17 Na 140, K 3.3,  Bun 29, creat 1.31. Continue to observe.      Family/ staff Communication: plan of care reviewed with the patient and staff  Labs/tests ordered:  CBC BMP done 04/17/17  Time spend 25 minutes.

## 2017-04-17 NOTE — Assessment & Plan Note (Signed)
Continue daily MiraLax, hold for loose stools or diarrhea.

## 2017-04-17 NOTE — Assessment & Plan Note (Signed)
Not certain of etiology, ? irritation of meds/food, resolved today, 04/17/17 Na 140, K 3.3, Bun 29, creat 1.31. Continue to observe.

## 2017-04-18 DIAGNOSIS — R2681 Unsteadiness on feet: Secondary | ICD-10-CM | POA: Diagnosis not present

## 2017-04-18 DIAGNOSIS — R531 Weakness: Secondary | ICD-10-CM | POA: Diagnosis not present

## 2017-04-18 DIAGNOSIS — R600 Localized edema: Secondary | ICD-10-CM | POA: Diagnosis not present

## 2017-04-18 DIAGNOSIS — M6281 Muscle weakness (generalized): Secondary | ICD-10-CM | POA: Diagnosis not present

## 2017-04-20 ENCOUNTER — Encounter: Payer: Self-pay | Admitting: Nurse Practitioner

## 2017-04-20 ENCOUNTER — Non-Acute Institutional Stay (SKILLED_NURSING_FACILITY): Payer: Medicare Other | Admitting: Nurse Practitioner

## 2017-04-20 DIAGNOSIS — R29898 Other symptoms and signs involving the musculoskeletal system: Secondary | ICD-10-CM | POA: Diagnosis not present

## 2017-04-20 DIAGNOSIS — R600 Localized edema: Secondary | ICD-10-CM | POA: Diagnosis not present

## 2017-04-20 DIAGNOSIS — R531 Weakness: Secondary | ICD-10-CM | POA: Diagnosis not present

## 2017-04-20 DIAGNOSIS — H04123 Dry eye syndrome of bilateral lacrimal glands: Secondary | ICD-10-CM | POA: Diagnosis not present

## 2017-04-20 DIAGNOSIS — R41841 Cognitive communication deficit: Secondary | ICD-10-CM | POA: Diagnosis not present

## 2017-04-20 DIAGNOSIS — R278 Other lack of coordination: Secondary | ICD-10-CM | POA: Diagnosis not present

## 2017-04-20 NOTE — Assessment & Plan Note (Signed)
c/o dry, red, itchy eyes, staff reported both eyes red, no drainage noted, her eyes are not injected upon my examination, she denied pain or vision change of her eyes. She stated she has a history of dry eyes, but currently she is off artificial tears. She denied headache, dizziness, trouble speaking or swallowing associated with her complaint. She is afebrile, no running nose, facial pressure or pain.  Artificial tears 2 gtt OU qid, observe the patient.

## 2017-04-20 NOTE — Progress Notes (Signed)
Location:   SNF [facility] Room Number: 82 NKNLZ of Service:  SNF (31) Provider: Lennie Odor Bhavika Schnider NP  Blanchie Serve, MD  Patient Care Team: Blanchie Serve, MD as PCP - General (Internal Medicine) Josue Hector, MD as Consulting Physician (Cardiology) Azucena Fallen, MD as Consulting Physician (Obstetrics and Gynecology) Chesley Mires, MD as Consulting Physician (Pulmonary Disease) Savannaha Stonerock X, NP as Nurse Practitioner (Internal Medicine)  Extended Emergency Contact Information Primary Emergency Contact: Chauncey,Amy Address: [address]          [place], [place] [address] Johnnette Litter of [place] Phone: [phone] Relation: Daughter Secondary Emergency Contact: Boxman,Lee Address: [address]          [place], [place] [address] Johnnette Litter of [place] Phone: [phone] Mobile Phone: [phone] Relation: Daughter  Code Status: DNR Goals of care: Advanced Directive information Advanced Directives 04/20/2017  Does Patient Have a Medical Advance Directive? Yes  Type of Advance Directive -  Does patient want to make changes to medical advance directive? -  Copy of [place] in Chart? -  Would patient like information on creating a medical advance directive? -  Pre-existing out of facility DNR order (yellow form or pink MOST form) -     Chief Complaint  Patient presents with  . Acute Visit    dry eyes    HPI:  Pt is a 83 y.o. female seen today for an acute visit: the patient c/o dry, red, itchy eyes, staff reported both eyes red, no drainage noted, her eyes are not injected upon my examination, she denied pain or vision change of her eyes. She stated she has a history of dry eyes, but currently she is off artificial tears. She denied headache, dizziness, trouble speaking or swallowing associated with her complaint. She is afebrile, no running nose, facial pressure or pain.    Past Medical History:  Diagnosis Date  . ARTHRITIS, KNEES,  BILATERAL   . Breast CA ([place]) 2000   s/p R lumpectomy and XRT  . DEPRESSION   . DJD (degenerative joint disease) of knee   . GERD   . HYPERLIPIDEMIA   . HYPERTENSION   . HYPOTHYROIDISM    postsurgical  . Hypoxia   . MIGRAINE HEADACHE   . Morbid obesity ([facility] [place])   . OSA (obstructive sleep apnea) 01/17/2011 dx  . Oxygen dependent   . Urge incontinence   . UTI (lower urinary tract infection) 12/2014   Past Surgical History:  Procedure Laterality Date  . ABDOMINAL HYSTERECTOMY    . ADENOIDECTOMY    . BREAST BIOPSY  2000  . BREAST LUMPECTOMY  2000  . CATARACT EXTRACTION    . CHOLECYSTECTOMY  1995  . CYSTOSCOPY/URETEROSCOPY/HOLMIUM LASER Right 06/28/2015   Procedure: CYSTOSCOPY RIGHT RETROGRAD RIGHT URETEROSCOPY/HOLMIUM LASER WITH RIGHT STENT PLACEMENT;  Surgeon: Alexis Frock, MD;  Location: [facility];  Service: Urology;  Laterality: Right;  . PARATHYROIDECTOMY     2-3 removed  . REPLACEMENT TOTAL KNEE BILATERAL  1999, 2005  . Right leg femur repaired    . THYROIDECTOMY    . TONSILLECTOMY    . TUBAL LIGATION      Allergies  Allergen Reactions  . Azithromycin Itching  . Beta Adrenergic Blockers     Depression   . Ciprofloxacin Other (See Comments)    REACTION: edgy and very jumpy   . Codeine Nausea And Vomiting  . Epinephrine Other (See Comments)    REACTION: rapid pulse, sweats  . Klonopin [  Clonazepam] Other (See Comments)    Makes her feel very suicidal   . Oxycodone Other (See Comments)    Patient felt like it altered her mental status "Crazy" . Hallucinations later as well.  [name] Paxil [Paroxetine Hydrochloride] Other (See Comments)    Sever depression   . Prednisone     "makes me feel really bad"  . Propoxyphene Hcl Nausea And Vomiting  . Torsemide   . Tramadol Other (See Comments)    Feels "jittery"  . Vicodin [Hydrocodone-Acetaminophen] Other (See Comments)    Hallucinations  . Meloxicam Diarrhea  . Vancomycin Rash    Localized rash related to infusion rate.     Allergies as of 04/20/2017      Reactions   Azithromycin Itching   Beta Adrenergic Blockers    Depression   Ciprofloxacin Other (See Comments)   REACTION: edgy and very jumpy    Codeine Nausea And Vomiting   Epinephrine Other (See Comments)   REACTION: rapid pulse, sweats   Klonopin [clonazepam] Other (See Comments)   Makes her feel very suicidal    Oxycodone Other (See Comments)   Patient felt like it altered her mental status "Crazy" . Hallucinations later as well.   Paxil [paroxetine Hydrochloride] Other (See Comments)   Sever depression    Prednisone    "makes me feel really bad"   Propoxyphene Hcl Nausea And Vomiting   Torsemide    Tramadol Other (See Comments)   Feels "jittery"   Vicodin [hydrocodone-acetaminophen] Other (See Comments)   Hallucinations   Meloxicam Diarrhea   Vancomycin Rash   Localized rash related to infusion rate.      Medication List       Accurate as of 04/20/17  2:42 PM. Always use your most recent med list.          acetaminophen 325 MG tablet Commonly known as:  TYLENOL Take 650 mg by mouth every 8 (eight) hours as needed for mild pain or moderate pain.   aspirin 81 MG tablet Take 81 mg by mouth daily.   diazepam 5 MG tablet Commonly known as:  VALIUM Take 2.5 mg by mouth every 4 (four) hours. for anxiety   diphenhydrAMINE 25 MG tablet Commonly known as:  BENADRYL Take 1 tablet (25 mg total) by mouth every 6 (six) hours as needed for itching (Rash).   diphenoxylate-atropine 2.5-0.025 MG tablet Commonly known as:  LOMOTIL TAKE 1 TABLET FOUR TIMES DAILY AS NEEDED FOR DIARRHEA OR LOOSE STOOLS.   docusate sodium 100 MG capsule Commonly known as:  COLACE Take 1 capsule (100 mg total) by mouth 2 (two) times daily as needed for mild constipation or moderate constipation.   furosemide 40 MG tablet Commonly known as:  LASIX Take 1 tablet (40 mg total) by mouth 2 (two) times daily.   hydrochlorothiazide 25 MG tablet Commonly  known as:  HYDRODIURIL TAKE 1 TABLET DAILY.   levothyroxine 150 MCG tablet Commonly known as:  SYNTHROID, LEVOTHROID Take 150 mcg by mouth daily before breakfast.   losartan 100 MG tablet Commonly known as:  COZAAR Take 1 tablet (100 mg total) by mouth daily. Hold for SBP less than 110.   metolazone 2.5 MG tablet Commonly known as:  ZAROXOLYN Take 1 tablet (2.5 mg total) by mouth daily.   MYLANTA 200-200-20 MG/5ML suspension Generic drug:  alum & mag hydroxide-simeth Take 30 mLs by mouth every 4 (four) hours as needed for indigestion or heartburn.   NORVASC 5 MG tablet Generic drug:  amLODipine Take 5 mg by mouth daily.   omeprazole 20 MG capsule Commonly known as:  PRILOSEC TAKE (1) CAPSULE DAILY.   ondansetron 4 MG tablet Commonly known as:  ZOFRAN Take 1 tablet (4 mg total) by mouth every 6 (six) hours as needed for nausea.   polyethylene glycol packet Commonly known as:  MIRALAX / GLYCOLAX Take 17 g by mouth daily.   potassium chloride 10 MEQ tablet Commonly known as:  K-DUR,KLOR-CON Take 60 mEq by mouth daily.   saccharomyces boulardii 250 MG capsule Commonly known as:  FLORASTOR Take 250 mg by mouth 2 (two) times daily.   sertraline 50 MG tablet Commonly known as:  ZOLOFT Take one and a half tablet daily for anxiety       Review of Systems  Constitutional: Negative for activity change, appetite change, chills, diaphoresis, fatigue and fever.  HENT: Positive for hearing loss. Negative for congestion, ear pain, rhinorrhea, sinus pain, sinus pressure, sore throat, trouble swallowing and voice change.   Eyes: Positive for redness and itching. Negative for photophobia, pain, discharge and visual disturbance.  Neurological: Negative for tremors, facial asymmetry, speech difficulty, weakness and headaches.    Immunization History  Administered Date(s) Administered  . Influenza Split 03/20/2011  . Influenza Whole 03/19/2009, 03/19/2012  . Influenza,inj,Quad  PF,6+ Mos 03/19/2013  . Influenza-Unspecified 03/03/2014, 03/15/2015, 03/30/2016  . Meningococcal Polysaccharide 03/15/2015  . PPD Test 03/30/2016  . Pneumococcal Conjugate-13 03/12/2014  . Pneumococcal Polysaccharide-23 06/19/2006, 03/15/2015  . Td 10/04/2009  . Zoster 06/19/2010   Pertinent  Health Maintenance Due  Topic Date Due  . INFLUENZA VACCINE  01/17/2017  . DEXA SCAN  06/19/2017 (Originally 09/07/1997)  . PNA vac Low Risk Adult  Completed   Fall Risk  12/22/2016 04/06/2016 11/27/2015 11/18/2015 08/26/2015  Falls in the past year? Yes Yes No No No  Number falls in past yr: 1 1 - - -  Injury with Fall? No Yes - - -  Risk for fall due to : - - - - -   Functional Status Survey:    Vitals:   04/20/17 1425  BP: 128/90  Pulse: 88  Temp: (!) 97.3 F (36.3 C)  SpO2: 95%   There is no height or weight on file to calculate BMI. Physical Exam  Constitutional: She is oriented to person, place, and time. She appears well-developed and well-nourished.  HENT:  Head: Normocephalic and atraumatic.  Eyes: Pupils are equal, round, and reactive to light. Conjunctivae and EOM are normal. Right eye exhibits no discharge. Left eye exhibits no discharge. No scleral icterus.  Neurological: She is alert and oriented to person, place, and time. No cranial nerve deficit. She exhibits normal muscle tone. Coordination normal.    Labs reviewed:  Recent Labs  11/02/16 2043  01/12/17 0307  03/15/17 0800 04/03/17 04/10/17  NA 140  < > 139  < > 142 140 140  K 3.8  < > 4.0  < > 3.4* 2.9* 3.4  CL 101  --  103  --  100  --   --   CO2 33*  --  29  --  34*  --   --   GLUCOSE 108*  --  107*  --  102*  --   --   BUN 16  < > 14  < > 26* 25* 28*  CREATININE 0.94  < > 0.93  < > 0.98* 1.0 0.9  CALCIUM 7.6*  --  8.6*  --  8.5*  --   --   < > =  values in this interval not displayed.  Recent Labs  01/12/17 0307 01/18/17 04/03/17  AST 15 10* 16  ALT 13* 9 10  ALKPHOS 73 77 86  BILITOT 0.7  --   --    PROT 6.5  --   --   ALBUMIN 3.4*  --   --     Recent Labs  11/02/16 2043  01/12/17 0307 01/18/17 04/03/17  WBC 7.2  < > 6.1 6.3 6.8  NEUTROABS  --   --  4.2  --   --   HGB 13.0  < > 12.8 13.0 13.0  HCT 39.9  < > 38.5 40 39  MCV 90.5  --  89.1  --   --   PLT 143*  < > 158 142* 204  < > = values in this interval not displayed. Lab Results  Component Value Date   TSH 5.62 04/03/2017   Lab Results  Component Value Date   HGBA1C 6.0 10/05/2016   Lab Results  Component Value Date   CHOL 172 07/29/2014   HDL 42.30 07/29/2014   LDLCALC 98 07/29/2014   LDLDIRECT 99.4 03/13/2012   TRIG 159.0 (H) 07/29/2014   CHOLHDL 4 07/29/2014    Significant Diagnostic Results in last 30 days:  No results found.  Assessment/Plan: Bilateral dry eyes c/o dry, red, itchy eyes, staff reported both eyes red, no drainage noted, her eyes are not injected upon my examination, she denied pain or vision change of her eyes. She stated she has a history of dry eyes, but currently she is off artificial tears. She denied headache, dizziness, trouble speaking or swallowing associated with her complaint. She is afebrile, no running nose, facial pressure or pain.  Artificial tears 2 gtt OU qid, observe the patient.     Family/ staff Communication: plan of care reviewed with the patient and charge nurse  Labs/tests ordered:  none  Time spend 15 minutes

## 2017-04-23 DIAGNOSIS — R600 Localized edema: Secondary | ICD-10-CM | POA: Diagnosis not present

## 2017-04-23 DIAGNOSIS — R278 Other lack of coordination: Secondary | ICD-10-CM | POA: Diagnosis not present

## 2017-04-23 DIAGNOSIS — R531 Weakness: Secondary | ICD-10-CM | POA: Diagnosis not present

## 2017-04-23 DIAGNOSIS — R29898 Other symptoms and signs involving the musculoskeletal system: Secondary | ICD-10-CM | POA: Diagnosis not present

## 2017-04-23 DIAGNOSIS — R41841 Cognitive communication deficit: Secondary | ICD-10-CM | POA: Diagnosis not present

## 2017-04-25 ENCOUNTER — Encounter: Payer: Self-pay | Admitting: Nurse Practitioner

## 2017-04-25 ENCOUNTER — Non-Acute Institutional Stay (SKILLED_NURSING_FACILITY): Payer: Medicare Other | Admitting: Nurse Practitioner

## 2017-04-25 ENCOUNTER — Encounter (HOSPITAL_BASED_OUTPATIENT_CLINIC_OR_DEPARTMENT_OTHER): Payer: Medicare Other | Attending: Surgery

## 2017-04-25 DIAGNOSIS — Z853 Personal history of malignant neoplasm of breast: Secondary | ICD-10-CM | POA: Diagnosis not present

## 2017-04-25 DIAGNOSIS — Z923 Personal history of irradiation: Secondary | ICD-10-CM | POA: Diagnosis not present

## 2017-04-25 DIAGNOSIS — N184 Chronic kidney disease, stage 4 (severe): Secondary | ICD-10-CM | POA: Diagnosis not present

## 2017-04-25 DIAGNOSIS — I890 Lymphedema, not elsewhere classified: Secondary | ICD-10-CM | POA: Insufficient documentation

## 2017-04-25 DIAGNOSIS — R0789 Other chest pain: Secondary | ICD-10-CM | POA: Diagnosis not present

## 2017-04-25 DIAGNOSIS — Z86718 Personal history of other venous thrombosis and embolism: Secondary | ICD-10-CM | POA: Insufficient documentation

## 2017-04-25 DIAGNOSIS — I10 Essential (primary) hypertension: Secondary | ICD-10-CM | POA: Diagnosis not present

## 2017-04-25 DIAGNOSIS — Z6841 Body Mass Index (BMI) 40.0 and over, adult: Secondary | ICD-10-CM | POA: Insufficient documentation

## 2017-04-25 DIAGNOSIS — I87323 Chronic venous hypertension (idiopathic) with inflammation of bilateral lower extremity: Secondary | ICD-10-CM | POA: Insufficient documentation

## 2017-04-25 DIAGNOSIS — K219 Gastro-esophageal reflux disease without esophagitis: Secondary | ICD-10-CM | POA: Diagnosis not present

## 2017-04-25 DIAGNOSIS — I129 Hypertensive chronic kidney disease with stage 1 through stage 4 chronic kidney disease, or unspecified chronic kidney disease: Secondary | ICD-10-CM | POA: Diagnosis not present

## 2017-04-25 DIAGNOSIS — R079 Chest pain, unspecified: Secondary | ICD-10-CM

## 2017-04-25 NOTE — Assessment & Plan Note (Signed)
Blood pressure is controlled, continue Losartan $RemoveBeforeDEI'100mg'[id number]$ , Amlodipine $RemoveBefo'5mg'XOmerkWqhjJ$  daily.

## 2017-04-25 NOTE — Assessment & Plan Note (Signed)
reported sudden onset sharp chest pain at least 2x 04/24/17, locate left chest at the left border of sternum, last a few seconds, the 2nd episode was accompanied with hot/diaphoresis and fatigue, better after Mylanta x1. Denied palpitation, sense of impending doom, dizziness, cough, phlegm production, headache, or focal weakness. She is afebrile, no O2 desaturation. Obtain EKG to evaluate further, observe the patient.

## 2017-04-25 NOTE — Assessment & Plan Note (Signed)
Hx of GERD, taking Omeprazole $RemoveBeforeDEI'20mg'xTqjeoMPjlWuppeF$  qd, prn Zofran and Mylanta.

## 2017-04-25 NOTE — Progress Notes (Signed)
Location:  [facility] Room Number: 79 Place of Service:  SNF (31) Provider:  Keng Jewel, Manxie  NP  Blanchie Serve, MD  Patient Care Team: Blanchie Serve, MD as PCP - General (Internal Medicine) Josue Hector, MD as Consulting Physician (Cardiology) Azucena Fallen, MD as Consulting Physician (Obstetrics and Gynecology) Chesley Mires, MD as Consulting Physician (Pulmonary Disease) Emalia Witkop X, NP as Nurse Practitioner (Internal Medicine)  Extended Emergency Contact Information Primary Emergency Contact: Chauncey,Amy Address: [address]          [place], [place] [address] Johnnette Litter of [place] Phone: [phone] Relation: Daughter Secondary Emergency Contact: Boxman,Lee Address: [address]          [place], [place] [address] Johnnette Litter of [place] Phone: [phone] Mobile Phone: [phone] Relation: Daughter  Code Status:  Full Code Goals of care: Advanced Directive information Advanced Directives 04/20/2017  Does Patient Have a Medical Advance Directive? Yes  Type of Advance Directive -  Does patient want to make changes to medical advance directive? -  Copy of [place] in Chart? -  Would patient like information on creating a medical advance directive? -  Pre-existing out of facility DNR order (yellow form or pink MOST form) -     Chief Complaint  Patient presents with  . Acute Visit    chest discomfort x 2    HPI:  Pt is a 83 y.o. female seen today for an acute visit for reported sudden onset sharp chest pain at least 2x 04/24/17, locate left chest at the left border of sternum, last a few seconds, the 2nd episode was accompanied with hot/diaphoresis, nausea, fatigue, better after Mylanta x1. She had headache and itching skin at 9pm, relieved with Tylenol and benadryl. Denied palpitation, sense of impending doom, dizziness, cough, phlegm production, headache, or focal weakness. She is afebrile, no O2  desaturation.   Hx of GERD, taking Omeprazole $RemoveBeforeDEI'20mg'KLAoOGeGtkqWQnfa$  qd, prn Zofran and Mylanta. Blood pressure is controlled on Losartan $RemoveBef'100mg'fvZSOiTUJd$  qd, Amlodipine $RemoveBefore'5mg'cfyVnaYoDCrGe$  daily.    Past Medical History:  Diagnosis Date  . ARTHRITIS, KNEES, BILATERAL   . Breast CA ([facility]) 2000   s/p R lumpectomy and XRT  . DEPRESSION   . DJD (degenerative joint disease) of knee   . GERD   . HYPERLIPIDEMIA   . HYPERTENSION   . HYPOTHYROIDISM    postsurgical  . Hypoxia   . MIGRAINE HEADACHE   . Morbid obesity ([facility])   . OSA (obstructive sleep apnea) 01/17/2011 dx  . Oxygen dependent   . Urge incontinence   . UTI (lower urinary tract infection) 12/2014   Past Surgical History:  Procedure Laterality Date  . ABDOMINAL HYSTERECTOMY    . ADENOIDECTOMY    . BREAST BIOPSY  2000  . BREAST LUMPECTOMY  2000  . CATARACT EXTRACTION    . CHOLECYSTECTOMY  1995  . PARATHYROIDECTOMY     2-3 removed  . REPLACEMENT TOTAL KNEE BILATERAL  1999, 2005  . Right leg femur repaired    . THYROIDECTOMY    . TONSILLECTOMY    . TUBAL LIGATION      Allergies  Allergen Reactions  . Azithromycin Itching  . Beta Adrenergic Blockers     Depression   . Ciprofloxacin Other (See Comments)    REACTION: edgy and very jumpy   . Codeine Nausea And Vomiting  . Epinephrine Other (See Comments)    REACTION: rapid pulse, sweats  . Klonopin [Clonazepam] Other (See Comments)  Makes her feel very suicidal   . Oxycodone Other (See Comments)    Patient felt like it altered her mental status "Crazy" . Hallucinations later as well.  [name] Paxil [Paroxetine Hydrochloride] Other (See Comments)    Sever depression   . Prednisone     "makes me feel really bad"  . Propoxyphene Hcl Nausea And Vomiting  . Torsemide   . Tramadol Other (See Comments)    Feels "jittery"  . Vicodin [Hydrocodone-Acetaminophen] Other (See Comments)    Hallucinations  . Meloxicam Diarrhea  . Vancomycin Rash    Localized rash related to infusion rate.    Outpatient Encounter  Medications as of 04/25/2017  Medication Sig  . acetaminophen (TYLENOL) 325 MG tablet Take 650 mg by mouth every 8 (eight) hours as needed for mild pain or moderate pain.   [name] alum & mag hydroxide-simeth (MYLANTA) 200-200-20 MG/5ML suspension Take 30 mLs by mouth every 4 (four) hours as needed for indigestion or heartburn.  [name] amLODipine (NORVASC) 5 MG tablet Take 5 mg by mouth daily.  [name] aspirin 81 MG tablet Take 81 mg by mouth daily.   . diazepam (VALIUM) 5 MG tablet Take 2.5 mg every 4 (four) hours by mouth. for anxiety  . diphenhydrAMINE (BENADRYL) 25 MG tablet Take 1 tablet (25 mg total) by mouth every 6 (six) hours as needed for itching (Rash).  . diphenoxylate-atropine (LOMOTIL) 2.5-0.025 MG tablet TAKE 1 TABLET FOUR TIMES DAILY AS NEEDED FOR DIARRHEA OR LOOSE STOOLS.  [name] docusate sodium (COLACE) 100 MG capsule Take 1 capsule (100 mg total) by mouth 2 (two) times daily as needed for mild constipation or moderate constipation.  . furosemide (LASIX) 40 MG tablet Take 1 tablet (40 mg total) by mouth 2 (two) times daily.  . hydrochlorothiazide (HYDRODIURIL) 25 MG tablet TAKE 1 TABLET DAILY.  [name] levothyroxine (SYNTHROID, LEVOTHROID) 150 MCG tablet Take 150 mcg by mouth daily before breakfast.  . losartan (COZAAR) 100 MG tablet Take 1 tablet (100 mg total) by mouth daily. Hold for SBP less than 110.  . metolazone (ZAROXOLYN) 2.5 MG tablet Take 1 tablet (2.5 mg total) by mouth daily.  [name] omeprazole (PRILOSEC) 20 MG capsule TAKE (1) CAPSULE DAILY.  [name] ondansetron (ZOFRAN) 4 MG tablet Take 1 tablet (4 mg total) by mouth every 6 (six) hours as needed for nausea.  . polyethylene glycol (MIRALAX / GLYCOLAX) packet Take 17 g by mouth daily.  . potassium chloride (K-DUR,KLOR-CON) 10 MEQ tablet Take 60 mEq by mouth daily.  [name] saccharomyces boulardii (FLORASTOR) 250 MG capsule Take 250 mg by mouth 2 (two) times daily.  . sertraline (ZOLOFT) 50 MG tablet Take one and a half tablet daily for anxiety   No  facility-administered encounter medications on file as of 04/25/2017.     Review of Systems  Constitutional: Negative for activity change, appetite change, chills, diaphoresis, fatigue and fever.  Respiratory: Negative for cough, choking, chest tightness, shortness of breath and wheezing.   Cardiovascular: Positive for chest pain and leg swelling. Negative for palpitations.  Gastrointestinal: Positive for nausea. Negative for abdominal distention, abdominal pain, constipation, diarrhea and vomiting.  Skin: Negative for color change, pallor, rash and wound.       C/o itching skin 9qm 04/24/17, relieved by Benadryl.   Neurological: Positive for headaches.       HA at 9pm 04/24/17, resolved after Tylenol.   Psychiatric/Behavioral: Negative for agitation, behavioral problems and sleep disturbance. The patient is not nervous/anxious.     Immunization  History  Administered Date(s) Administered  . Influenza Split 03/20/2011  . Influenza Whole 03/19/2009, 03/19/2012  . Influenza,inj,Quad PF,6+ Mos 03/19/2013  . Influenza-Unspecified 03/03/2014, 03/15/2015, 03/30/2016, 04/05/2017  . Meningococcal Polysaccharide 03/15/2015  . PPD Test 03/30/2016  . Pneumococcal Conjugate-13 03/12/2014  . Pneumococcal Polysaccharide-23 06/19/2006, 03/15/2015  . Td 10/04/2009  . Tdap 06/19/2005  . Zoster 06/19/2010   Pertinent  Health Maintenance Due  Topic Date Due  . DEXA SCAN  06/19/2017 (Originally 09/07/1997)  . INFLUENZA VACCINE  Completed  . PNA vac Low Risk Adult  Completed   Fall Risk  12/22/2016 04/06/2016 11/27/2015 11/18/2015 08/26/2015  Falls in the past year? Yes Yes No No No  Number falls in past yr: 1 1 - - -  Injury with Fall? No Yes - - -  Risk for fall due to : - - - - -   Functional Status Survey:    Vitals:   04/25/17 1333  BP: (!) 150/86  Pulse: 72  Resp: 18  Temp: (!) 97.2 F (36.2 C)  SpO2: 95%  Weight: 286 lb 9.6 oz (130 kg)  Height: $Remove'5\' 5"'txRsUPi$  (1.651 m)   Body mass index is 47.69  kg/m. Physical Exam  Constitutional: She is oriented to person, place, and time. She appears well-developed and well-nourished.  HENT:  Head: Normocephalic.  Cardiovascular: Normal rate, regular rhythm and normal heart sounds.  No murmur heard. Pulmonary/Chest: Effort normal and breath sounds normal. No respiratory distress. She has no wheezes. She has no rales. She exhibits no tenderness.  Abdominal: Soft. Bowel sounds are normal. She exhibits no distension. There is no tenderness.  Neurological: She is alert and oriented to person, place, and time. She exhibits normal muscle tone. Coordination normal.  Skin: Skin is warm and dry.  Psychiatric: She has a normal mood and affect. Her behavior is normal.    Labs reviewed: Recent Labs    11/02/16 2043  01/12/17 0307  03/15/17 0800 04/03/17 04/10/17  NA 140   < > 139   < > 142 140 140  K 3.8   < > 4.0   < > 3.4* 2.9* 3.4  CL 101  --  103  --  100  --   --   CO2 33*  --  29  --  34*  --   --   GLUCOSE 108*  --  107*  --  102*  --   --   BUN 16   < > 14   < > 26* 25* 28*  CREATININE 0.94   < > 0.93   < > 0.98* 1.0 0.9  CALCIUM 7.6*  --  8.6*  --  8.5*  --   --    < > = values in this interval not displayed.   Recent Labs    01/12/17 0307 01/18/17 04/03/17  AST 15 10* 16  ALT 13* 9 10  ALKPHOS 73 77 86  BILITOT 0.7  --   --   PROT 6.5  --   --   ALBUMIN 3.4*  --   --    Recent Labs    11/02/16 2043  01/12/17 0307 01/18/17 04/03/17  WBC 7.2   < > 6.1 6.3 6.8  NEUTROABS  --   --  4.2  --   --   HGB 13.0   < > 12.8 13.0 13.0  HCT 39.9   < > 38.5 40 39  MCV 90.5  --  89.1  --   --  PLT 143*   < > 158 142* 204   < > = values in this interval not displayed.   Lab Results  Component Value Date   TSH 5.62 04/03/2017   Lab Results  Component Value Date   HGBA1C 6.0 10/05/2016   Lab Results  Component Value Date   CHOL 172 07/29/2014   HDL 42.30 07/29/2014   LDLCALC 98 07/29/2014   LDLDIRECT 99.4 03/13/2012   TRIG  159.0 (H) 07/29/2014   CHOLHDL 4 07/29/2014    Significant Diagnostic Results in last 30 days:  No results found.  Assessment/Plan Other chest pain reported sudden onset sharp chest pain at least 2x 04/24/17, locate left chest at the left border of sternum, last a few seconds, the 2nd episode was accompanied with hot/diaphoresis and fatigue, better after Mylanta x1. Denied palpitation, sense of impending doom, dizziness, cough, phlegm production, headache, or focal weakness. She is afebrile, no O2 desaturation. Obtain EKG to evaluate further, observe the patient.   Acid reflux Hx of GERD, taking Omeprazole $RemoveBeforeDEI'20mg'aeuFKpDmDEnzpjIz$  qd, prn Zofran and Mylanta.     Essential hypertension Blood pressure is controlled, continue Losartan $RemoveBeforeDEI'100mg'HsOuiFytguRmoAfM$ , Amlodipine $RemoveBefo'5mg'IaLPFNsOhOe$  daily.      Family/ staff Communication: plan of care reviewed with the patient and charge nurse.   Labs/tests ordered:  EKG  Time spend 25 minutes.

## 2017-04-27 DIAGNOSIS — R600 Localized edema: Secondary | ICD-10-CM | POA: Diagnosis not present

## 2017-04-27 DIAGNOSIS — R41841 Cognitive communication deficit: Secondary | ICD-10-CM | POA: Diagnosis not present

## 2017-04-27 DIAGNOSIS — R29898 Other symptoms and signs involving the musculoskeletal system: Secondary | ICD-10-CM | POA: Diagnosis not present

## 2017-04-27 DIAGNOSIS — R531 Weakness: Secondary | ICD-10-CM | POA: Diagnosis not present

## 2017-04-27 DIAGNOSIS — R278 Other lack of coordination: Secondary | ICD-10-CM | POA: Diagnosis not present

## 2017-04-30 ENCOUNTER — Non-Acute Institutional Stay (SKILLED_NURSING_FACILITY): Payer: Medicare Other | Admitting: Nurse Practitioner

## 2017-04-30 ENCOUNTER — Encounter: Payer: Self-pay | Admitting: Nurse Practitioner

## 2017-04-30 DIAGNOSIS — R41841 Cognitive communication deficit: Secondary | ICD-10-CM | POA: Diagnosis not present

## 2017-04-30 DIAGNOSIS — R519 Headache, unspecified: Secondary | ICD-10-CM

## 2017-04-30 DIAGNOSIS — R51 Headache: Secondary | ICD-10-CM

## 2017-04-30 DIAGNOSIS — E039 Hypothyroidism, unspecified: Secondary | ICD-10-CM | POA: Diagnosis not present

## 2017-04-30 DIAGNOSIS — L739 Follicular disorder, unspecified: Secondary | ICD-10-CM | POA: Diagnosis not present

## 2017-04-30 DIAGNOSIS — K219 Gastro-esophageal reflux disease without esophagitis: Secondary | ICD-10-CM

## 2017-04-30 DIAGNOSIS — R531 Weakness: Secondary | ICD-10-CM | POA: Diagnosis not present

## 2017-04-30 DIAGNOSIS — R600 Localized edema: Secondary | ICD-10-CM | POA: Diagnosis not present

## 2017-04-30 DIAGNOSIS — R29898 Other symptoms and signs involving the musculoskeletal system: Secondary | ICD-10-CM | POA: Diagnosis not present

## 2017-04-30 DIAGNOSIS — R278 Other lack of coordination: Secondary | ICD-10-CM | POA: Diagnosis not present

## 2017-04-30 NOTE — Progress Notes (Signed)
Location:  [facility] Room Number: 30 Place of Service:  SNF (31) Provider:  Donie Lemelin, Manxie   NP  Blanchie Serve, MD  Patient Care Team: Blanchie Serve, MD as PCP - General (Internal Medicine) Josue Hector, MD as Consulting Physician (Cardiology) Azucena Fallen, MD as Consulting Physician (Obstetrics and Gynecology) Chesley Mires, MD as Consulting Physician (Pulmonary Disease) Letitia Sabala X, NP as Nurse Practitioner (Internal Medicine)  Extended Emergency Contact Information Primary Emergency Contact: Chauncey,Amy Address: [address]          [place], [place] [address] Johnnette Litter of [place] Phone: [phone] Relation: Daughter Secondary Emergency Contact: Boxman,Lee Address: [address]          [place], [place] [address] Johnnette Litter of [place] Phone: [phone] Mobile Phone: [phone] Relation: Daughter  Code Status:  Full Code Goals of care: Advanced Directive information Advanced Directives 04/20/2017  Does Patient Have a Medical Advance Directive? Yes  Type of Advance Directive -  Does patient want to make changes to medical advance directive? -  Copy of [place] in Chart? -  Would patient like information on creating a medical advance directive? -  Pre-existing out of facility DNR order (yellow form or pink MOST form) -     Chief Complaint  Patient presents with  . Acute Visit    C/O sinus headaches    HPI:  Pt is a 83 y.o. female seen today for an acute visit for the right side of "sinus headache" since admitted to SNF 03/27/17, on and off, Tylenol $RemoveBefo'325mg'bLhTEVpyHJs$  II is effective, she stated she had it in the past, she denied vision change, ear pain, sore throat, cough, tearing, or running nose, she has nausea, not new, she has history of GERD, taking Omeprazole $RemoveBeforeDEI'20mg'njEEXVxqZtLeQyNX$  daily, prn Zofran and and Mylanta-effective, she denied vomiting, or diarrhea, she denied focal weakness. She was noted to have 2 red bumps mid  forehead for 4-5 days, size of dime, slightly tender when touched, denied itching, she thinks its some of kind of "bug". She denied new cosmetic products, new detergent/shampoo/soap or garment/sheets.    Past Medical History:  Diagnosis Date  . ARTHRITIS, KNEES, BILATERAL   . Breast CA ([facility]) 2000   s/p R lumpectomy and XRT  . DEPRESSION   . DJD (degenerative joint disease) of knee   . GERD   . HYPERLIPIDEMIA   . HYPERTENSION   . HYPOTHYROIDISM    postsurgical  . Hypoxia   . MIGRAINE HEADACHE   . Morbid obesity ([facility])   . OSA (obstructive sleep apnea) 01/17/2011 dx  . Oxygen dependent   . Urge incontinence   . UTI (lower urinary tract infection) 12/2014   Past Surgical History:  Procedure Laterality Date  . ABDOMINAL HYSTERECTOMY    . ADENOIDECTOMY    . BREAST BIOPSY  2000  . BREAST LUMPECTOMY  2000  . CATARACT EXTRACTION    . CHOLECYSTECTOMY  1995  . CYSTOSCOPY/URETEROSCOPY/HOLMIUM LASER Right 06/28/2015   Procedure: CYSTOSCOPY RIGHT RETROGRAD RIGHT URETEROSCOPY/HOLMIUM LASER WITH RIGHT STENT PLACEMENT;  Surgeon: Alexis Frock, MD;  Location: [facility];  Service: Urology;  Laterality: Right;  . PARATHYROIDECTOMY     2-3 removed  . REPLACEMENT TOTAL KNEE BILATERAL  1999, 2005  . Right leg femur repaired    . THYROIDECTOMY    . TONSILLECTOMY    . TUBAL LIGATION      Allergies  Allergen Reactions  . Azithromycin Itching  . Beta Adrenergic  Blockers     Depression   . Ciprofloxacin Other (See Comments)    REACTION: edgy and very jumpy   . Codeine Nausea And Vomiting  . Epinephrine Other (See Comments)    REACTION: rapid pulse, sweats  . Klonopin [Clonazepam] Other (See Comments)    Makes her feel very suicidal   . Oxycodone Other (See Comments)    Patient felt like it altered her mental status "Crazy" . Hallucinations later as well.  [name] Paxil [Paroxetine Hydrochloride] Other (See Comments)    Sever depression   . Prednisone     "makes me feel really bad"  .  Propoxyphene Hcl Nausea And Vomiting  . Torsemide   . Tramadol Other (See Comments)    Feels "jittery"  . Vicodin [Hydrocodone-Acetaminophen] Other (See Comments)    Hallucinations  . Meloxicam Diarrhea  . Vancomycin Rash    Localized rash related to infusion rate.    Outpatient Encounter Medications as of 04/30/2017  Medication Sig  . acetaminophen (TYLENOL) 325 MG tablet Take 650 mg by mouth every 8 (eight) hours as needed for mild pain or moderate pain.   [name] alum & mag hydroxide-simeth (MYLANTA) 200-200-20 MG/5ML suspension Take 30 mLs by mouth every 4 (four) hours as needed for indigestion or heartburn.  [name] amLODipine (NORVASC) 5 MG tablet Take 5 mg by mouth daily.  [name] aspirin 81 MG tablet Take 81 mg by mouth daily.   . diazepam (VALIUM) 5 MG tablet Take 2.5 mg every 4 (four) hours by mouth. for anxiety  . diphenhydrAMINE (BENADRYL) 25 MG tablet Take 1 tablet (25 mg total) by mouth every 6 (six) hours as needed for itching (Rash).  . diphenoxylate-atropine (LOMOTIL) 2.5-0.025 MG tablet TAKE 1 TABLET FOUR TIMES DAILY AS NEEDED FOR DIARRHEA OR LOOSE STOOLS.  [name] docusate sodium (COLACE) 100 MG capsule Take 1 capsule (100 mg total) by mouth 2 (two) times daily as needed for mild constipation or moderate constipation.  . furosemide (LASIX) 40 MG tablet Take 1 tablet (40 mg total) by mouth 2 (two) times daily.  . hydrochlorothiazide (HYDRODIURIL) 25 MG tablet TAKE 1 TABLET DAILY.  [name] levothyroxine (SYNTHROID, LEVOTHROID) 150 MCG tablet Take 150 mcg by mouth daily before breakfast.  . losartan (COZAAR) 100 MG tablet Take 1 tablet (100 mg total) by mouth daily. Hold for SBP less than 110.  . metolazone (ZAROXOLYN) 2.5 MG tablet Take 1 tablet (2.5 mg total) by mouth daily.  [name] omeprazole (PRILOSEC) 20 MG capsule TAKE (1) CAPSULE DAILY.  [name] ondansetron (ZOFRAN) 4 MG tablet Take 1 tablet (4 mg total) by mouth every 6 (six) hours as needed for nausea.  . polyethylene glycol (MIRALAX / GLYCOLAX) packet  Take 17 g by mouth daily.  . polyvinyl alcohol (LIQUIFILM TEARS) 1.4 % ophthalmic solution Place 2 drops 4 (four) times daily into both eyes.  . potassium chloride (K-DUR,KLOR-CON) 10 MEQ tablet Take 60 mEq by mouth daily.  [name] saccharomyces boulardii (FLORASTOR) 250 MG capsule Take 250 mg by mouth 2 (two) times daily.  . sertraline (ZOLOFT) 50 MG tablet Take one and a half tablet daily for anxiety   No facility-administered encounter medications on file as of 04/30/2017.     Review of Systems  Constitutional: Negative for activity change, appetite change, chills, diaphoresis, fatigue and fever.  HENT: Positive for sinus pressure and sinus pain. Negative for congestion, ear pain, facial swelling, hearing loss, mouth sores, postnasal drip, rhinorrhea, sneezing, sore throat, trouble swallowing and voice change.   Eyes:  Negative for photophobia, pain, discharge, redness, itching and visual disturbance.       She does admit feels better when her right eye is closed when her right sinus pain is flare up  Respiratory: Negative for cough, choking, chest tightness, shortness of breath and wheezing.   Cardiovascular: Positive for leg swelling. Negative for chest pain and palpitations.  Gastrointestinal: Positive for nausea. Negative for abdominal distention, abdominal pain, constipation, diarrhea and vomiting.  Skin: Negative for color change, pallor, rash and wound.       Mid forehead red bumps x2  Allergic/Immunologic: Negative for environmental allergies and food allergies.  Neurological: Negative for dizziness, tremors, speech difficulty, weakness, light-headedness, numbness and headaches.  Psychiatric/Behavioral: Negative for agitation, behavioral problems, confusion and sleep disturbance. The patient is not nervous/anxious.     Immunization History  Administered Date(s) Administered  . Influenza Split 03/20/2011  . Influenza Whole 03/19/2009, 03/19/2012  . Influenza,inj,Quad PF,6+ Mos  03/19/2013  . Influenza-Unspecified 03/03/2014, 03/15/2015, 03/30/2016, 04/05/2017  . Meningococcal Polysaccharide 03/15/2015  . PPD Test 03/30/2016  . Pneumococcal Conjugate-13 03/12/2014  . Pneumococcal Polysaccharide-23 06/19/2006, 03/15/2015  . Td 10/04/2009  . Tdap 06/19/2005  . Zoster 06/19/2010   Pertinent  Health Maintenance Due  Topic Date Due  . DEXA SCAN  06/19/2017 (Originally 09/07/1997)  . INFLUENZA VACCINE  Completed  . PNA vac Low Risk Adult  Completed   Fall Risk  12/22/2016 04/06/2016 11/27/2015 11/18/2015 08/26/2015  Falls in the past year? Yes Yes No No No  Number falls in past yr: 1 1 - - -  Injury with Fall? No Yes - - -  Risk for fall due to : - - - - -   Functional Status Survey:    Vitals:   04/30/17 1026  BP: 130/80  Pulse: 80  Resp: (!) 24  Temp: (!) 97.3 F (36.3 C)  SpO2: 93%  Weight: 286 lb 9.6 oz (130 kg)  Height: $Remove'5\' 5"'fIZgnFC$  (1.651 m)   Body mass index is 47.69 kg/m. Physical Exam  Constitutional: She is oriented to person, place, and time. She appears well-developed and well-nourished. No distress.  HENT:  Head: Normocephalic and atraumatic.  Mouth/Throat: Oropharynx is clear and moist. No oropharyngeal exudate.  Eyes: Conjunctivae and EOM are normal. Pupils are equal, round, and reactive to light. Right eye exhibits no discharge. Left eye exhibits no discharge.  Neck: Normal range of motion. Neck supple. No JVD present. No thyromegaly present.  Cardiovascular: Normal rate and regular rhythm.  No murmur heard. Pulmonary/Chest: Effort normal and breath sounds normal. She has no wheezes. She has no rales.  Abdominal: Soft. Bowel sounds are normal. She exhibits no distension. There is no tenderness. There is no rebound and no guarding.  Neurological: She is alert and oriented to person, place, and time. No cranial nerve deficit. She exhibits normal muscle tone. Coordination normal.  Skin: Skin is warm and dry. No rash noted. She is not diaphoretic. No  pallor.  Mid forehead red bumps near hair line, about a dime size, slightly tender when palpated, not itching, no noted pus filled appearance.   Psychiatric: She has a normal mood and affect. Her behavior is normal.    Labs reviewed: Recent Labs    11/02/16 2043  01/12/17 0307  03/15/17 0800 04/03/17 04/10/17  NA 140   < > 139   < > 142 140 140  K 3.8   < > 4.0   < > 3.4* 2.9* 3.4  CL 101  --  103  --  100  --   --   CO2 33*  --  29  --  34*  --   --   GLUCOSE 108*  --  107*  --  102*  --   --   BUN 16   < > 14   < > 26* 25* 28*  CREATININE 0.94   < > 0.93   < > 0.98* 1.0 0.9  CALCIUM 7.6*  --  8.6*  --  8.5*  --   --    < > = values in this interval not displayed.   Recent Labs    01/12/17 0307 01/18/17 04/03/17  AST 15 10* 16  ALT 13* 9 10  ALKPHOS 73 77 86  BILITOT 0.7  --   --   PROT 6.5  --   --   ALBUMIN 3.4*  --   --    Recent Labs    11/02/16 2043  01/12/17 0307 01/18/17 04/03/17  WBC 7.2   < > 6.1 6.3 6.8  NEUTROABS  --   --  4.2  --   --   HGB 13.0   < > 12.8 13.0 13.0  HCT 39.9   < > 38.5 40 39  MCV 90.5  --  89.1  --   --   PLT 143*   < > 158 142* 204   < > = values in this interval not displayed.   Lab Results  Component Value Date   TSH 5.62 04/03/2017   Lab Results  Component Value Date   HGBA1C 6.0 10/05/2016   Lab Results  Component Value Date   CHOL 172 07/29/2014   HDL 42.30 07/29/2014   LDLCALC 98 07/29/2014   LDLDIRECT 99.4 03/13/2012   TRIG 159.0 (H) 07/29/2014   CHOLHDL 4 07/29/2014    Significant Diagnostic Results in last 30 days:  No results found.  Assessment/Plan Sinus headache the right side of "sinus headache" since admitted to SNF 03/27/17, on and off, Tylenol $RemoveBefo'325mg'SxcTiltABnr$  II is effective, she stated she had it in the past, she denied vision change, ear pain, sore throat, cough, tearing, or running nose, she denied nausea, vomiting, or diarrhea, she denied focal weakness. Continue prn Tylenol, observe the patient, update  CBC/diff and CMP  Folliculitis Mid forehead next to hairline, warm compress 59min/each, up to 4x/day x 5 days, observe.   Acid reflux she has nausea, not new, she has history of GERD, taking Omeprazole $RemoveBeforeDEI'20mg'FMGxOnGUZeMwkAVM$  daily, prn Zofran and and Mylanta-effective,     Family/ staff Communication: plan of care reviewed with the patient and charge nurse  Labs/tests ordered:  CBC/diff CMP  Time spend 25 minutes.

## 2017-04-30 NOTE — Assessment & Plan Note (Signed)
she has nausea, not new, she has history of GERD, taking Omeprazole $RemoveBeforeDEI'20mg'PHkhorBxgzWmkcOE$  daily, prn Zofran and and Mylanta-effective,

## 2017-04-30 NOTE — Assessment & Plan Note (Signed)
Mid forehead next to hairline, warm compress 90min/each, up to 4x/day x 5 days, observe.

## 2017-04-30 NOTE — Assessment & Plan Note (Addendum)
the right side of "sinus headache" since admitted to SNF 03/27/17, on and off, Tylenol $RemoveBefo'325mg'vrIhCayapaG$  II is effective, she stated she had it in the past, she denied vision change, ear pain, sore throat, cough, tearing, or running nose, she denied nausea, vomiting, or diarrhea, she denied focal weakness. Continue prn Tylenol, observe the patient, update CBC/diff and CMP

## 2017-05-01 DIAGNOSIS — R41841 Cognitive communication deficit: Secondary | ICD-10-CM | POA: Diagnosis not present

## 2017-05-01 DIAGNOSIS — R531 Weakness: Secondary | ICD-10-CM | POA: Diagnosis not present

## 2017-05-01 DIAGNOSIS — R29898 Other symptoms and signs involving the musculoskeletal system: Secondary | ICD-10-CM | POA: Diagnosis not present

## 2017-05-01 DIAGNOSIS — R600 Localized edema: Secondary | ICD-10-CM | POA: Diagnosis not present

## 2017-05-01 DIAGNOSIS — R278 Other lack of coordination: Secondary | ICD-10-CM | POA: Diagnosis not present

## 2017-05-01 DIAGNOSIS — I10 Essential (primary) hypertension: Secondary | ICD-10-CM | POA: Diagnosis not present

## 2017-05-02 DIAGNOSIS — R278 Other lack of coordination: Secondary | ICD-10-CM | POA: Diagnosis not present

## 2017-05-02 DIAGNOSIS — R41841 Cognitive communication deficit: Secondary | ICD-10-CM | POA: Diagnosis not present

## 2017-05-02 DIAGNOSIS — R29898 Other symptoms and signs involving the musculoskeletal system: Secondary | ICD-10-CM | POA: Diagnosis not present

## 2017-05-02 DIAGNOSIS — R531 Weakness: Secondary | ICD-10-CM | POA: Diagnosis not present

## 2017-05-02 DIAGNOSIS — R600 Localized edema: Secondary | ICD-10-CM | POA: Diagnosis not present

## 2017-05-03 DIAGNOSIS — R600 Localized edema: Secondary | ICD-10-CM | POA: Diagnosis not present

## 2017-05-03 DIAGNOSIS — N179 Acute kidney failure, unspecified: Secondary | ICD-10-CM | POA: Diagnosis not present

## 2017-05-03 DIAGNOSIS — R41841 Cognitive communication deficit: Secondary | ICD-10-CM | POA: Diagnosis not present

## 2017-05-03 DIAGNOSIS — R29898 Other symptoms and signs involving the musculoskeletal system: Secondary | ICD-10-CM | POA: Diagnosis not present

## 2017-05-03 DIAGNOSIS — I890 Lymphedema, not elsewhere classified: Secondary | ICD-10-CM | POA: Diagnosis not present

## 2017-05-03 DIAGNOSIS — E1065 Type 1 diabetes mellitus with hyperglycemia: Secondary | ICD-10-CM | POA: Diagnosis not present

## 2017-05-03 DIAGNOSIS — L03115 Cellulitis of right lower limb: Secondary | ICD-10-CM | POA: Diagnosis not present

## 2017-05-03 DIAGNOSIS — R531 Weakness: Secondary | ICD-10-CM | POA: Diagnosis not present

## 2017-05-03 DIAGNOSIS — R278 Other lack of coordination: Secondary | ICD-10-CM | POA: Diagnosis not present

## 2017-05-03 LAB — TSH: TSH: 5.72 (ref <=5.90)

## 2017-05-03 LAB — BASIC METABOLIC PANEL: Potassium: 3.4 (ref 3.4–5.3)

## 2017-05-04 ENCOUNTER — Other Ambulatory Visit: Payer: Self-pay | Admitting: *Deleted

## 2017-05-04 DIAGNOSIS — R278 Other lack of coordination: Secondary | ICD-10-CM | POA: Diagnosis not present

## 2017-05-04 DIAGNOSIS — R29898 Other symptoms and signs involving the musculoskeletal system: Secondary | ICD-10-CM | POA: Diagnosis not present

## 2017-05-04 DIAGNOSIS — R531 Weakness: Secondary | ICD-10-CM | POA: Diagnosis not present

## 2017-05-04 DIAGNOSIS — R600 Localized edema: Secondary | ICD-10-CM | POA: Diagnosis not present

## 2017-05-04 DIAGNOSIS — R41841 Cognitive communication deficit: Secondary | ICD-10-CM | POA: Diagnosis not present

## 2017-05-04 NOTE — Assessment & Plan Note (Signed)
05/03/17 TSH 5.72, increase Levothyroxine 185mcg po qd, TSH 12 we

## 2017-05-06 DIAGNOSIS — R531 Weakness: Secondary | ICD-10-CM | POA: Diagnosis not present

## 2017-05-06 DIAGNOSIS — R278 Other lack of coordination: Secondary | ICD-10-CM | POA: Diagnosis not present

## 2017-05-06 DIAGNOSIS — R41841 Cognitive communication deficit: Secondary | ICD-10-CM | POA: Diagnosis not present

## 2017-05-06 DIAGNOSIS — R600 Localized edema: Secondary | ICD-10-CM | POA: Diagnosis not present

## 2017-05-06 DIAGNOSIS — R29898 Other symptoms and signs involving the musculoskeletal system: Secondary | ICD-10-CM | POA: Diagnosis not present

## 2017-05-07 ENCOUNTER — Non-Acute Institutional Stay (SKILLED_NURSING_FACILITY): Payer: Medicare Other | Admitting: Nurse Practitioner

## 2017-05-07 ENCOUNTER — Encounter: Payer: Self-pay | Admitting: Nurse Practitioner

## 2017-05-07 DIAGNOSIS — I890 Lymphedema, not elsewhere classified: Secondary | ICD-10-CM

## 2017-05-07 DIAGNOSIS — N184 Chronic kidney disease, stage 4 (severe): Secondary | ICD-10-CM | POA: Diagnosis not present

## 2017-05-07 DIAGNOSIS — M6281 Muscle weakness (generalized): Secondary | ICD-10-CM | POA: Diagnosis not present

## 2017-05-07 DIAGNOSIS — I10 Essential (primary) hypertension: Secondary | ICD-10-CM | POA: Diagnosis not present

## 2017-05-07 DIAGNOSIS — E039 Hypothyroidism, unspecified: Secondary | ICD-10-CM | POA: Diagnosis not present

## 2017-05-07 DIAGNOSIS — K5909 Other constipation: Secondary | ICD-10-CM

## 2017-05-07 DIAGNOSIS — M25512 Pain in left shoulder: Secondary | ICD-10-CM | POA: Diagnosis not present

## 2017-05-07 DIAGNOSIS — F418 Other specified anxiety disorders: Secondary | ICD-10-CM | POA: Diagnosis not present

## 2017-05-07 DIAGNOSIS — R2681 Unsteadiness on feet: Secondary | ICD-10-CM | POA: Diagnosis not present

## 2017-05-07 DIAGNOSIS — M25552 Pain in left hip: Secondary | ICD-10-CM | POA: Diagnosis not present

## 2017-05-07 DIAGNOSIS — K219 Gastro-esophageal reflux disease without esophagitis: Secondary | ICD-10-CM

## 2017-05-07 DIAGNOSIS — E876 Hypokalemia: Secondary | ICD-10-CM | POA: Diagnosis not present

## 2017-05-07 DIAGNOSIS — M545 Low back pain: Secondary | ICD-10-CM | POA: Diagnosis not present

## 2017-05-07 NOTE — Assessment & Plan Note (Signed)
her blood pressure is controlled, continue Losartan $RemoveBeforeDEI'100mg'dwtjqbVSbrEnwpxT$  qd, off Amlodipine.

## 2017-05-07 NOTE — Progress Notes (Signed)
Location:  [place] Room Number: 91 Place of Service:  SNF (31)  Provider: Marlana Latus  NP  PCP: Blanchie Serve, MD Patient Care Team: Blanchie Serve, MD as PCP - General (Internal Medicine) Josue Hector, MD as Consulting Physician (Cardiology) Azucena Fallen, MD as Consulting Physician (Obstetrics and Gynecology) Chesley Mires, MD as Consulting Physician (Pulmonary Disease) Lotoya Casella X, NP as Nurse Practitioner (Internal Medicine)  Extended Emergency Contact Information Primary Emergency Contact: Chauncey,Amy Address: [address]          [place], [place] [address] Johnnette Litter of [place] Phone: [phone] Relation: Daughter Secondary Emergency Contact: Boxman,Lee Address: [address]          [place], [place] [address] Johnnette Litter of [place] Phone: [phone] Mobile Phone: [phone] Relation: Daughter  Code Status: Full Code Goals of care:  Advanced Directive information Advanced Directives 04/20/2017  Does Patient Have a Medical Advance Directive? Yes  Type of Advance Directive -  Does patient want to make changes to medical advance directive? -  Copy of [place] in Chart? -  Would patient like information on creating a medical advance directive? -  Pre-existing out of facility DNR order (yellow form or pink MOST form) -     Allergies  Allergen Reactions  . Azithromycin Itching  . Beta Adrenergic Blockers     Depression   . Ciprofloxacin Other (See Comments)    REACTION: edgy and very jumpy   . Codeine Nausea And Vomiting  . Epinephrine Other (See Comments)    REACTION: rapid pulse, sweats  . Klonopin [Clonazepam] Other (See Comments)    Makes her feel very suicidal   . Oxycodone Other (See Comments)    Patient felt like it altered her mental status "Crazy" . Hallucinations later as well.  [name] Paxil [Paroxetine Hydrochloride] Other (See Comments)    Sever depression   . Prednisone     "makes  me feel really bad"  . Propoxyphene Hcl Nausea And Vomiting  . Torsemide   . Tramadol Other (See Comments)    Feels "jittery"  . Vicodin [Hydrocodone-Acetaminophen] Other (See Comments)    Hallucinations  . Meloxicam Diarrhea  . Vancomycin Rash    Localized rash related to infusion rate.    Chief Complaint  Patient presents with  . Discharge Note    HPI:  83 y.o. female with history of hypothyroidism, HTN, CKD: Bun 30, creat 1.13 05/01/17, chronic edema in BLE, depression, insomnia, and GERD was admitted to [facility] 03/24/17 following ED evaluation for rash related to Torsemide use and subsequently was noted to have right leg cellulitis which was healed upon completion of 7 day course of Doxycycline $RemoveBefore'100mg'YPzODjwkCUxSW$  bid po in SNF. She is stable to return [place] [facility] she resides.    Hx of hypothyroidism, recently increased Levothyroxine to 127mcg/150mcg since TSH was mildly elevated to 5.72 05/03/17, f/u TSH scheduled 08/02/17. Her potassium was supplement with Kcl 180meq(60 meq in am 40 meq in pm), last serum K 3.4 05/03/17. Chronic BLE edema, had open wounds/cellulitis in the past, f/u at [facility], pending vascular surgeon consultation in 2 weeks, wears compression garment, taking Furosemide $RemoveBeforeDEI'40mg'lfiTMkYXmXsVDgan$  bid, Metolazone 2.$RemoveBeforeDEI'5mg'YQPreWlwYuvVNBuW$  qd, HTCZ $Remove'25mg'pTshYaj$  qd. Her mood is stable while on Sertraline $RemoveBefor'50mg'RXJwbyqZudTx$  qd, Diazepam 2.$RemoveBefore'5mg'kHTmwRiLDmhuJ$  bid prn. She takes Miralax daily, her blood pressure is controlled on Losartan $RemoveBef'100mg'TkjLvolzTf$  qd, GERD is managed with Omeprazole $RemoveBeforeD'20mg'nVifRJGQhqWSYS$ , prn Zofran.     Past Medical History:  Diagnosis  Date  . ARTHRITIS, KNEES, BILATERAL   . Breast CA ([place]) 2000   s/p R lumpectomy and XRT  . DEPRESSION   . DJD (degenerative joint disease) of knee   . GERD   . HYPERLIPIDEMIA   . HYPERTENSION   . HYPOTHYROIDISM    postsurgical  . Hypoxia   . MIGRAINE HEADACHE   . Morbid obesity ([place])   . OSA (obstructive sleep apnea) 01/17/2011 dx  . Oxygen dependent   . Urge incontinence   . UTI (lower urinary tract infection)  12/2014    Past Surgical History:  Procedure Laterality Date  . ABDOMINAL HYSTERECTOMY    . ADENOIDECTOMY    . BREAST BIOPSY  2000  . BREAST LUMPECTOMY  2000  . CATARACT EXTRACTION    . CHOLECYSTECTOMY  1995  . CYSTOSCOPY RIGHT RETROGRAD RIGHT URETEROSCOPY/HOLMIUM LASER WITH RIGHT STENT PLACEMENT Right 06/28/2015   Performed by Alexis Frock, MD at [facility] ORS  . PARATHYROIDECTOMY     2-3 removed  . REPLACEMENT TOTAL KNEE BILATERAL  1999, 2005  . Right leg femur repaired    . THYROIDECTOMY    . TONSILLECTOMY    . TUBAL LIGATION        reports that she quit smoking about 42 years ago. Her smoking use included cigarettes. She has a 0.50 pack-year smoking history. she has never used smokeless tobacco. She reports that she does not drink alcohol or use drugs. Social History   Socioeconomic History  . Marital status: Married    Spouse name: Not on file  . Number of children: 5  . Years of education: Not on file  . Highest education level: Not on file  Social Needs  . Financial resource strain: Not on file  . Food insecurity - worry: Not on file  . Food insecurity - inability: Not on file  . Transportation needs - medical: Not on file  . Transportation needs - non-medical: Not on file  Occupational History  . Occupation: RETIRED    Employer: RETIRED    Comment: worked on office  Tobacco Use  . Smoking status: Former Smoker    Packs/day: 0.25    Years: 2.00    Pack years: 0.50    Types: Cigarettes    Last attempt to quit: 06/19/1974    Years since quitting: 42.9  . Smokeless tobacco: Never Used  . Tobacco comment: Quit in late 20's  Substance and Sexual Activity  . Alcohol use: No    Alcohol/week: 0.0 oz    Comment: rare  . Drug use: No  . Sexual activity: No  Other Topics Concern  . Not on file  Social History Narrative   Married to husband that has dementia (he lives in the skilled nursing wing) and this is a big stressor. 5 children. 10 grandchildren. 2  greatgrandchildren. All children [place]      Lives at [facility], in independent living.      Retired from multiple different Community education officer, university, Psychologist, educational.       Hobbies: Management consultant, write poetry, craft dolls   Functional Status Survey:    Allergies  Allergen Reactions  . Azithromycin Itching  . Beta Adrenergic Blockers     Depression   . Ciprofloxacin Other (See Comments)    REACTION: edgy and very jumpy   . Codeine Nausea And Vomiting  . Epinephrine Other (See Comments)    REACTION: rapid pulse, sweats  . Klonopin [Clonazepam] Other (See Comments)    Makes her  feel very suicidal   . Oxycodone Other (See Comments)    Patient felt like it altered her mental status "Crazy" . Hallucinations later as well.  [name] Paxil [Paroxetine Hydrochloride] Other (See Comments)    Sever depression   . Prednisone     "makes me feel really bad"  . Propoxyphene Hcl Nausea And Vomiting  . Torsemide   . Tramadol Other (See Comments)    Feels "jittery"  . Vicodin [Hydrocodone-Acetaminophen] Other (See Comments)    Hallucinations  . Meloxicam Diarrhea  . Vancomycin Rash    Localized rash related to infusion rate.    Pertinent  Health Maintenance Due  Topic Date Due  . DEXA SCAN  06/19/2017 (Originally 09/07/1997)  . INFLUENZA VACCINE  Completed  . PNA vac Low Risk Adult  Completed    Medications: Outpatient Encounter Medications as of 05/07/2017  Medication Sig  . acetaminophen (TYLENOL) 325 MG tablet Take 650 mg by mouth every 8 (eight) hours as needed for mild pain or moderate pain.   [name] alum & mag hydroxide-simeth (MYLANTA) 200-200-20 MG/5ML suspension Take 30 mLs by mouth every 4 (four) hours as needed for indigestion or heartburn.  [name] aspirin 81 MG tablet Take 81 mg by mouth daily.   . diphenhydrAMINE (BENADRYL) 25 MG tablet Take 1 tablet (25 mg total) by mouth every 6 (six) hours as needed for itching (Rash).  . diphenoxylate-atropine (LOMOTIL) 2.5-0.025 MG tablet  TAKE 1 TABLET FOUR TIMES DAILY AS NEEDED FOR DIARRHEA OR LOOSE STOOLS.  [name] docusate sodium (COLACE) 100 MG capsule Take 1 capsule (100 mg total) by mouth 2 (two) times daily as needed for mild constipation or moderate constipation.  . furosemide (LASIX) 40 MG tablet Take 1 tablet (40 mg total) by mouth 2 (two) times daily.  . hydrochlorothiazide (HYDRODIURIL) 25 MG tablet TAKE 1 TABLET DAILY.  [name] levothyroxine (SYNTHROID, LEVOTHROID) 175 MCG tablet Take 175 mcg daily before breakfast by mouth.  . losartan (COZAAR) 100 MG tablet Take 1 tablet (100 mg total) by mouth daily. Hold for SBP less than 110.  . metolazone (ZAROXOLYN) 2.5 MG tablet Take 1 tablet (2.5 mg total) by mouth daily.  [name] omeprazole (PRILOSEC) 20 MG capsule TAKE (1) CAPSULE DAILY.  [name] ondansetron (ZOFRAN) 4 MG tablet Take 1 tablet (4 mg total) by mouth every 6 (six) hours as needed for nausea.  . polyethylene glycol (MIRALAX / GLYCOLAX) packet Take 17 g by mouth daily.  . polyvinyl alcohol (LIQUIFILM TEARS) 1.4 % ophthalmic solution Place 2 drops 4 (four) times daily into both eyes.  . potassium chloride (K-DUR,KLOR-CON) 10 MEQ tablet Take 60 mEq by mouth daily.  [name] saccharomyces boulardii (FLORASTOR) 250 MG capsule Take 250 mg by mouth 2 (two) times daily.  . sertraline (ZOLOFT) 50 MG tablet Take one and a half tablet daily for anxiety  . [DISCONTINUED] amLODipine (NORVASC) 5 MG tablet Take 5 mg by mouth daily.  . [DISCONTINUED] diazepam (VALIUM) 5 MG tablet Take 2.5 mg every 4 (four) hours by mouth. for anxiety  . [DISCONTINUED] levothyroxine (SYNTHROID, LEVOTHROID) 150 MCG tablet Take 150 mcg by mouth daily before breakfast.   No facility-administered encounter medications on file as of 05/07/2017.     Review of Systems  Constitutional: Negative for activity change, appetite change, chills, diaphoresis, fatigue and fever.  HENT: Positive for hearing loss. Negative for congestion, sinus pressure, sinus pain, sore throat, trouble  swallowing and voice change.   Eyes: Negative for visual disturbance.  Respiratory: Negative for cough,  choking, chest tightness, shortness of breath and wheezing.   Cardiovascular: Positive for leg swelling. Negative for chest pain and palpitations.  Gastrointestinal: Negative for abdominal distention, abdominal pain, constipation, diarrhea and vomiting.  Endocrine: Negative for cold intolerance.  Genitourinary: Negative for difficulty urinating, dysuria, frequency and urgency.  Musculoskeletal: Positive for arthralgias and gait problem. Negative for joint swelling.       Motorized scooter for mobility.   Skin: Negative for color change, pallor, rash and wound.  Neurological: Negative for dizziness, tremors, speech difficulty, weakness and headaches.  Psychiatric/Behavioral: Negative for agitation, behavioral problems, confusion, hallucinations and sleep disturbance. The patient is not nervous/anxious.     Vitals:   05/07/17 0928  BP: 124/72  Pulse: 84  Resp: (!) 24  Temp: (!) 97.4 F (36.3 C)  Weight: 289 lb 8 oz (131.3 kg)  Height: $Remove'5\' 5"'xlZluws$  (1.651 m)   Body mass index is 48.18 kg/m. Physical Exam  Constitutional: She is oriented to person, place, and time. She appears well-developed and well-nourished. No distress.  HENT:  Head: Normocephalic and atraumatic.  Eyes: Conjunctivae are normal. Pupils are equal, round, and reactive to light.  Neck: Neck supple. No JVD present. No thyromegaly present.  Cardiovascular: Normal rate, regular rhythm and normal heart sounds.  No murmur heard. Pulmonary/Chest: Effort normal and breath sounds normal. She has no wheezes. She has no rales.  Abdominal: Soft. Bowel sounds are normal. She exhibits no distension. There is no tenderness.  Musculoskeletal: Normal range of motion. She exhibits edema. She exhibits no tenderness.  BLE edema, compression garment  Neurological: She is alert and oriented to person, place, and time. She has normal  reflexes. She exhibits normal muscle tone. Coordination normal.  Skin: Skin is warm and dry. She is not diaphoretic. No erythema. No pallor.  , lipo dermatosclerosis changes in BLE   Psychiatric: She has a normal mood and affect. Her behavior is normal. Judgment and thought content normal.    Labs reviewed: Basic Metabolic Panel: Recent Labs    11/02/16 2043  01/12/17 0307  03/15/17 0800 04/03/17 04/10/17 05/03/17  NA 140   < > 139   < > 142 140 140  --   K 3.8   < > 4.0   < > 3.4* 2.9* 3.4 3.4  CL 101  --  103  --  100  --   --   --   CO2 33*  --  29  --  34*  --   --   --   GLUCOSE 108*  --  107*  --  102*  --   --   --   BUN 16   < > 14   < > 26* 25* 28*  --   CREATININE 0.94   < > 0.93   < > 0.98* 1.0 0.9  --   CALCIUM 7.6*  --  8.6*  --  8.5*  --   --   --    < > = values in this interval not displayed.   Liver Function Tests: Recent Labs    01/12/17 0307 01/18/17 04/03/17  AST 15 10* 16  ALT 13* 9 10  ALKPHOS 73 77 86  BILITOT 0.7  --   --   PROT 6.5  --   --   ALBUMIN 3.4*  --   --    No results for input(s): LIPASE, AMYLASE in the last 8760 hours. No results for input(s): AMMONIA in the last 8760 hours. CBC: Recent  Labs    11/02/16 2043  01/12/17 0307 01/18/17 04/03/17  WBC 7.2   < > 6.1 6.3 6.8  NEUTROABS  --   --  4.2  --   --   HGB 13.0   < > 12.8 13.0 13.0  HCT 39.9   < > 38.5 40 39  MCV 90.5  --  89.1  --   --   PLT 143*   < > 158 142* 204   < > = values in this interval not displayed.   Cardiac Enzymes: No results for input(s): CKTOTAL, CKMB, CKMBINDEX, TROPONINI in the last 8760 hours. BNP: Invalid input(s): POCBNP CBG: Recent Labs    11/02/16 2001  GLUCAP 103*    Procedures and Imaging Studies During Stay: No results found.  Assessment/Plan:   Hypothyroidism Recently 05/03/17  increased Levothyroxine to 159mcg/150mcg since TSH was mildly elevated to 5.72 05/03/17, f/u TSH scheduled 08/02/17.  Hypokalemia Her potassium was supplement  with Kcl 140meq(60 meq in am 40 meq in pm), last serum K 3.4 05/03/17    Essential hypertension her blood pressure is controlled, continue Losartan 100mg  qd, off Amlodipine.   Acid reflux is managed, continue Omeprazole 20mg , prn Zofran.   Chronic constipation Stable, continue  Miralax daily  Depression with anxiety . Her mood is stable, continue Sertraline 50mg  qd, Diazepam 2.5mg  bid prn  Lymphedema of both lower extremities Chronic BLE edema, had open wounds/cellulitis in the past, f/u at [facility], pending vascular surgeon consultation in 2 weeks, wears compression garment, continue Furosemide 40mg  bid, Metolazone 2.5mg  qd, HTCZ 25mg  qd.  CKD (chronic kidney disease) stage 4, GFR 15-29 ml/min (HCC) Bun 30, creat 1.13 05/01/17, diuretics contributory.      Patient is being discharged with the following home health services:    Patient is being discharged with the following durable medical equipment:    Patient has been advised to f/u with their PCP in 1-2 weeks to for a transitions of care visit.  Social services at their facility was responsible for arranging this appointment.  Pt was provided with adequate prescriptions of noncontrolled medications to reach the scheduled appointment .  For controlled substances, a limited supply was provided as appropriate for the individual patient.  If the pt normally receives these medications from a pain clinic or has a contract with another physician, these medications should be received from that clinic or physician only).    Future labs/tests needed:  TSH 08/02/16  Time spend 25 minutes

## 2017-05-07 NOTE — Assessment & Plan Note (Signed)
Chronic BLE edema, had open wounds/cellulitis in the past, f/u at [facility], pending vascular surgeon consultation in 2 weeks, wears compression garment, continue Furosemide 40mg  bid, Metolazone 2.5mg  qd, HTCZ 25mg  qd.

## 2017-05-07 NOTE — Assessment & Plan Note (Signed)
Her potassium was supplement with Kcl 156meq(60 meq in am 40 meq in pm), last serum K 3.4 05/03/17

## 2017-05-07 NOTE — Assessment & Plan Note (Signed)
Bun 30, creat 1.13 05/01/17, diuretics contributory.

## 2017-05-07 NOTE — Assessment & Plan Note (Signed)
Recently 05/03/17  increased Levothyroxine to 179mcg/150mcg since TSH was mildly elevated to 5.72 05/03/17, f/u TSH scheduled 08/02/17.

## 2017-05-07 NOTE — Assessment & Plan Note (Signed)
.   Her mood is stable, continue Sertraline 50mg  qd, Diazepam 2.5mg  bid prn

## 2017-05-07 NOTE — Assessment & Plan Note (Signed)
is managed, continue Omeprazole 20mg , prn Zofran.

## 2017-05-07 NOTE — Assessment & Plan Note (Signed)
Stable, continue  Miralax daily

## 2017-05-08 ENCOUNTER — Telehealth: Payer: Self-pay | Admitting: Pulmonary Disease

## 2017-05-08 DIAGNOSIS — R2681 Unsteadiness on feet: Secondary | ICD-10-CM | POA: Diagnosis not present

## 2017-05-08 DIAGNOSIS — M6281 Muscle weakness (generalized): Secondary | ICD-10-CM | POA: Diagnosis not present

## 2017-05-08 DIAGNOSIS — M25552 Pain in left hip: Secondary | ICD-10-CM | POA: Diagnosis not present

## 2017-05-08 DIAGNOSIS — M545 Low back pain: Secondary | ICD-10-CM | POA: Diagnosis not present

## 2017-05-08 DIAGNOSIS — M25512 Pain in left shoulder: Secondary | ICD-10-CM | POA: Diagnosis not present

## 2017-05-08 NOTE — Telephone Encounter (Signed)
Rec'd paperwork from [facility] asking if the patient has had a qualifying walk test done this year? At this time we do not have the test completed. Left message for the patient today, needing pt to come in for a ROV to have walk test done prior to Jun 19 2017.

## 2017-05-09 DIAGNOSIS — M25552 Pain in left hip: Secondary | ICD-10-CM | POA: Diagnosis not present

## 2017-05-09 DIAGNOSIS — M545 Low back pain: Secondary | ICD-10-CM | POA: Diagnosis not present

## 2017-05-09 DIAGNOSIS — R2681 Unsteadiness on feet: Secondary | ICD-10-CM | POA: Diagnosis not present

## 2017-05-09 DIAGNOSIS — M25512 Pain in left shoulder: Secondary | ICD-10-CM | POA: Diagnosis not present

## 2017-05-09 DIAGNOSIS — M6281 Muscle weakness (generalized): Secondary | ICD-10-CM | POA: Diagnosis not present

## 2017-05-14 ENCOUNTER — Telehealth: Payer: Self-pay | Admitting: Internal Medicine

## 2017-05-14 DIAGNOSIS — M25512 Pain in left shoulder: Secondary | ICD-10-CM | POA: Diagnosis not present

## 2017-05-14 DIAGNOSIS — M6281 Muscle weakness (generalized): Secondary | ICD-10-CM | POA: Diagnosis not present

## 2017-05-14 DIAGNOSIS — M25552 Pain in left hip: Secondary | ICD-10-CM | POA: Diagnosis not present

## 2017-05-14 DIAGNOSIS — M545 Low back pain: Secondary | ICD-10-CM | POA: Diagnosis not present

## 2017-05-14 DIAGNOSIS — R2681 Unsteadiness on feet: Secondary | ICD-10-CM | POA: Diagnosis not present

## 2017-05-14 NOTE — Telephone Encounter (Signed)
Patient called back - I tried to schedule f/u appt - pt states that she wants a call back from [facility] - she thinks that the qualifying walk and ov may have already been done when she was seen in August - she can be reached at [phone] -pr

## 2017-05-14 NOTE — Telephone Encounter (Signed)
Left msg asking pt to schedule AWV-I at [facility] clinic on morning on Thursday 05/17/17 or on Friday 05/18/17. VDM (DD)

## 2017-05-14 NOTE — Telephone Encounter (Signed)
ATC pt, no answer. Left message for pt to call back.  She needs a walk, if it was done in the August visit then it was not documented.

## 2017-05-15 NOTE — Telephone Encounter (Signed)
Left voice mail on machine for patient to return phone call back regarding needing a ROV, and qualifying walk test.  Will follow up again with patient at later date.

## 2017-05-15 NOTE — Telephone Encounter (Signed)
Will close encounter, as nothing further is needed.  

## 2017-05-15 NOTE — Telephone Encounter (Signed)
Patient returned call, will not be there after 3 pm today.  CB [phone].

## 2017-05-15 NOTE — Telephone Encounter (Signed)
I called patient back and scheduled OV for qualifying walk per [facility].  No call back is needed.

## 2017-05-22 ENCOUNTER — Non-Acute Institutional Stay: Payer: Medicare Other | Admitting: Internal Medicine

## 2017-05-22 ENCOUNTER — Encounter: Payer: Self-pay | Admitting: Internal Medicine

## 2017-05-22 DIAGNOSIS — R7303 Prediabetes: Secondary | ICD-10-CM | POA: Diagnosis not present

## 2017-05-22 DIAGNOSIS — R21 Rash and other nonspecific skin eruption: Secondary | ICD-10-CM | POA: Diagnosis not present

## 2017-05-22 DIAGNOSIS — E039 Hypothyroidism, unspecified: Secondary | ICD-10-CM | POA: Diagnosis not present

## 2017-05-22 DIAGNOSIS — I890 Lymphedema, not elsewhere classified: Secondary | ICD-10-CM | POA: Diagnosis not present

## 2017-05-22 DIAGNOSIS — N183 Chronic kidney disease, stage 3 unspecified: Secondary | ICD-10-CM | POA: Insufficient documentation

## 2017-05-22 DIAGNOSIS — I10 Essential (primary) hypertension: Secondary | ICD-10-CM

## 2017-05-22 MED ORDER — NYSTATIN 100000 UNIT/GM EX CREA: TOPICAL_CREAM | Freq: Two times a day (BID) | CUTANEOUS | Status: DC

## 2017-05-22 MED ORDER — NYSTATIN 100000 UNIT/GM EX CREA: 1.0000 "application " | TOPICAL_CREAM | Freq: Two times a day (BID) | CUTANEOUS | 0 refills | Status: DC

## 2017-05-22 NOTE — Progress Notes (Signed)
[facility]  Provider: Blanchie Serve MD   Location:  [place] of Service:  Clinic (12)  PCP: Blanchie Serve, MD Patient Care Team: Blanchie Serve, MD as PCP - General (Internal Medicine) Josue Hector, MD as Consulting Physician (Cardiology) Azucena Fallen, MD as Consulting Physician (Obstetrics and Gynecology) Chesley Mires, MD as Consulting Physician (Pulmonary Disease) Mast, Man X, NP as Nurse Practitioner (Internal Medicine)  Extended Emergency Contact Information Primary Emergency Contact: Chauncey,Amy Address: [address]          [place], [place] [address] Johnnette Litter of [place] Phone: [phone] Relation: Daughter Secondary Emergency Contact: Boxman,Lee Address: [address]          [place], [place] [address] Johnnette Litter of [place] Phone: [phone] Mobile Phone: [phone] Relation: Daughter   Goals of Care: Advanced Directive information Advanced Directives 04/20/2017  Does Patient Have a Medical Advance Directive? Yes  Type of Advance Directive -  Does patient want to make changes to medical advance directive? -  Copy of [place] in Chart? -  Would patient like information on creating a medical advance directive? -  Pre-existing out of facility DNR order (yellow form or pink MOST form) -      Chief Complaint  Patient presents with  . Medical Management of Chronic Issues    HPI: Patient is a 83 y.o. female seen today for routine visit.   Hypertension- reviewed reading from home. SBP 112-139 and DBP 65-89 with one reading 103/65. Currently on HCTZ 25 mg daily, losartan 100 mg daily with lasix 40 mg bid  Hypothyroidism- currently taking levothyroxine 175 mcg daily.   Chronic lymphedema- stable per pt, using compression socks. Currently on lasix 40 mg bid and metolazone 2.5 mg daily.   CKD stage 4- has been urinating good. No blood in urine.   Past Medical History:    Diagnosis Date  . ARTHRITIS, KNEES, BILATERAL   . Breast CA ([place]) 2000   s/p R lumpectomy and XRT  . DEPRESSION   . DJD (degenerative joint disease) of knee   . GERD   . HYPERLIPIDEMIA   . HYPERTENSION   . HYPOTHYROIDISM    postsurgical  . Hypoxia   . MIGRAINE HEADACHE   . Morbid obesity ([place])   . OSA (obstructive sleep apnea) 01/17/2011 dx  . Oxygen dependent   . Urge incontinence   . UTI (lower urinary tract infection) 12/2014   Past Surgical History:  Procedure Laterality Date  . ABDOMINAL HYSTERECTOMY    . ADENOIDECTOMY    . BREAST BIOPSY  2000  . BREAST LUMPECTOMY  2000  . CATARACT EXTRACTION    . CHOLECYSTECTOMY  1995  . CYSTOSCOPY/URETEROSCOPY/HOLMIUM LASER Right 06/28/2015   Procedure: CYSTOSCOPY RIGHT RETROGRAD RIGHT URETEROSCOPY/HOLMIUM LASER WITH RIGHT STENT PLACEMENT;  Surgeon: Alexis Frock, MD;  Location: [facility];  Service: Urology;  Laterality: Right;  . PARATHYROIDECTOMY     2-3 removed  . REPLACEMENT TOTAL KNEE BILATERAL  1999, 2005  . Right leg femur repaired    . THYROIDECTOMY    . TONSILLECTOMY    . TUBAL LIGATION      reports that she quit smoking about 42 years ago. Her smoking use included cigarettes. She has a 0.50 pack-year smoking history. she has never used smokeless tobacco. She reports that she does not drink alcohol or use drugs. Social History   Socioeconomic History  . Marital status: Married  Spouse name: Not on file  . Number of children: 5  . Years of education: Not on file  . Highest education level: Not on file  Social Needs  . Financial resource strain: Not on file  . Food insecurity - worry: Not on file  . Food insecurity - inability: Not on file  . Transportation needs - medical: Not on file  . Transportation needs - non-medical: Not on file  Occupational History  . Occupation: RETIRED    Employer: RETIRED    Comment: worked on office  Tobacco Use  . Smoking status: Former Smoker    Packs/day: 0.25    Years: 2.00     Pack years: 0.50    Types: Cigarettes    Last attempt to quit: 06/19/1974    Years since quitting: 42.9  . Smokeless tobacco: Never Used  . Tobacco comment: Quit in late 20's  Substance and Sexual Activity  . Alcohol use: No    Alcohol/week: 0.0 oz    Comment: rare  . Drug use: No  . Sexual activity: No  Other Topics Concern  . Not on file  Social History Narrative   Married to husband that has dementia (he lives in the skilled nursing wing) and this is a big stressor. 5 children. 10 grandchildren. 2 greatgrandchildren. All children [place]      Lives at [facility], in independent living.      Retired from multiple different Community education officer, university, Psychologist, educational.       Hobbies: Management consultant, write poetry, craft dolls    Functional Status Survey:    Family History  Problem Relation Age of Onset  . Emphysema Sister   . Coronary artery disease Neg Hx     Health Maintenance  Topic Date Due  . DEXA SCAN  06/19/2017 (Originally 09/07/1997)  . TETANUS/TDAP  10/05/2019  . INFLUENZA VACCINE  Completed  . PNA vac Low Risk Adult  Completed    Allergies  Allergen Reactions  . Azithromycin Itching  . Beta Adrenergic Blockers     Depression   . Ciprofloxacin Other (See Comments)    REACTION: edgy and very jumpy   . Codeine Nausea And Vomiting  . Epinephrine Other (See Comments)    REACTION: rapid pulse, sweats  . Klonopin [Clonazepam] Other (See Comments)    Makes her feel very suicidal   . Oxycodone Other (See Comments)    Patient felt like it altered her mental status "Crazy" . Hallucinations later as well.  [name] Paxil [Paroxetine Hydrochloride] Other (See Comments)    Sever depression   . Prednisone     "makes me feel really bad"  . Propoxyphene Hcl Nausea And Vomiting  . Torsemide   . Tramadol Other (See Comments)    Feels "jittery"  . Vicodin [Hydrocodone-Acetaminophen] Other (See Comments)    Hallucinations  . Meloxicam Diarrhea  . Vancomycin Rash     Localized rash related to infusion rate.    Outpatient Encounter Medications as of 05/22/2017  Medication Sig  . aspirin 81 MG tablet Take 81 mg by mouth daily.   [name] docusate sodium (COLACE) 100 MG capsule Take 1 capsule (100 mg total) by mouth 2 (two) times daily as needed for mild constipation or moderate constipation.  . furosemide (LASIX) 40 MG tablet Take 1 tablet (40 mg total) by mouth 2 (two) times daily.  . hydrochlorothiazide (HYDRODIURIL) 25 MG tablet TAKE 1 TABLET DAILY.  [name] levothyroxine (SYNTHROID, LEVOTHROID) 175 MCG tablet Take 175 mcg daily  before breakfast by mouth.  . losartan (COZAAR) 100 MG tablet Take 1 tablet (100 mg total) by mouth daily. Hold for SBP less than 110.  . metolazone (ZAROXOLYN) 2.5 MG tablet Take 1 tablet (2.5 mg total) by mouth daily.  [name] omeprazole (PRILOSEC) 20 MG capsule TAKE (1) CAPSULE DAILY.  [name] ondansetron (ZOFRAN) 4 MG tablet Take 1 tablet (4 mg total) by mouth every 6 (six) hours as needed for nausea.  . sertraline (ZOLOFT) 50 MG tablet Take one and a half tablet daily for anxiety  . acetaminophen (TYLENOL) 325 MG tablet Take 650 mg by mouth every 8 (eight) hours as needed for mild pain or moderate pain.   [name] alum & mag hydroxide-simeth (MYLANTA) 200-200-20 MG/5ML suspension Take 30 mLs by mouth every 4 (four) hours as needed for indigestion or heartburn.  . diphenhydrAMINE (BENADRYL) 25 MG tablet Take 1 tablet (25 mg total) by mouth every 6 (six) hours as needed for itching (Rash). (Patient not taking: Reported on 05/22/2017)  . diphenoxylate-atropine (LOMOTIL) 2.5-0.025 MG tablet TAKE 1 TABLET FOUR TIMES DAILY AS NEEDED FOR DIARRHEA OR LOOSE STOOLS. (Patient not taking: Reported on 05/22/2017)  . [DISCONTINUED] polyethylene glycol (MIRALAX / GLYCOLAX) packet Take 17 g by mouth daily.  . [DISCONTINUED] polyvinyl alcohol (LIQUIFILM TEARS) 1.4 % ophthalmic solution Place 2 drops 4 (four) times daily into both eyes.  . [DISCONTINUED] potassium chloride  (K-DUR,KLOR-CON) 10 MEQ tablet Take 100 mEq by mouth daily.   . [DISCONTINUED] saccharomyces boulardii (FLORASTOR) 250 MG capsule Take 250 mg by mouth 2 (two) times daily.   No facility-administered encounter medications on file as of 05/22/2017.     Review of Systems  Constitutional: Negative for appetite change and fever.  HENT: Negative for congestion.   Respiratory: Positive for cough. Negative for shortness of breath and wheezing.   Cardiovascular: Positive for leg swelling. Negative for chest pain and palpitations.  Gastrointestinal: Positive for nausea. Negative for abdominal pain, constipation, diarrhea and vomiting.  Genitourinary: Negative for dysuria and hematuria.  Musculoskeletal: Positive for gait problem. Negative for arthralgias and back pain.  Skin: Negative for rash.       Thinks she has open area behind her right knee  Neurological: Negative for dizziness and headaches.    Vitals:   05/22/17 0956  BP: 140/72  Pulse: 90  Resp: 16  Temp: 97.6 F (36.4 C)  SpO2: 97%  Weight: 289 lb 6.4 oz (131.3 kg)  Height: '5\' 5"'  (1.651 m)   Body mass index is 48.16 kg/m.   Wt Readings from Last 3 Encounters:  05/22/17 289 lb 6.4 oz (131.3 kg)  05/07/17 289 lb 8 oz (131.3 kg)  04/30/17 286 lb 9.6 oz (130 kg)    Physical Exam  Constitutional: She is oriented to person, place, and time. No distress.  morbidly obese, in no acute distress  HENT:  Head: Normocephalic and atraumatic.  Mouth/Throat: Oropharynx is clear and moist. No oropharyngeal exudate.  Eyes: Conjunctivae and EOM are normal. Pupils are equal, round, and reactive to light. Right eye exhibits no discharge. Left eye exhibits no discharge.  Neck: Normal range of motion. Neck supple.  Cardiovascular: Normal rate and regular rhythm.  Pulmonary/Chest: Effort normal and breath sounds normal. No respiratory distress. She has no wheezes. She has no rales.  Abdominal: Soft. Bowel sounds are normal. There is no  tenderness. There is no guarding.  Musculoskeletal: She exhibits edema and deformity.  Limited ROM with hip joint, arthritis changes present, chronic lymphedema and  stasis changes to legs. Intact skin.   Lymphadenopathy:    She has no cervical adenopathy.  Neurological: She is alert and oriented to person, place, and time.  Skin: Skin is warm and dry. She is not diaphoretic.  Posterior aspect of right knee has mild erythema at skin fold. Dry skin. No breakdown  Psychiatric: She has a normal mood and affect. Her behavior is normal.    Labs reviewed: Basic Metabolic Panel: Recent Labs    11/02/16 2043  01/12/17 0307  03/15/17 0800 04/03/17 04/10/17 05/03/17  NA 140   < > 139   < > 142 140 140  --   K 3.8   < > 4.0   < > 3.4* 2.9* 3.4 3.4  CL 101  --  103  --  100  --   --   --   CO2 33*  --  29  --  34*  --   --   --   GLUCOSE 108*  --  107*  --  102*  --   --   --   BUN 16   < > 14   < > 26* 25* 28*  --   CREATININE 0.94   < > 0.93   < > 0.98* 1.0 0.9  --   CALCIUM 7.6*  --  8.6*  --  8.5*  --   --   --    < > = values in this interval not displayed.   Liver Function Tests: Recent Labs    01/12/17 0307 01/18/17 04/03/17  AST 15 10* 16  ALT 13* 9 10  ALKPHOS 73 77 86  BILITOT 0.7  --   --   PROT 6.5  --   --   ALBUMIN 3.4*  --   --    No results for input(s): LIPASE, AMYLASE in the last 8760 hours. No results for input(s): AMMONIA in the last 8760 hours. CBC: Recent Labs    11/02/16 2043  01/12/17 0307 01/18/17 04/03/17  WBC 7.2   < > 6.1 6.3 6.8  NEUTROABS  --   --  4.2  --   --   HGB 13.0   < > 12.8 13.0 13.0  HCT 39.9   < > 38.5 40 39  MCV 90.5  --  89.1  --   --   PLT 143*   < > 158 142* 204   < > = values in this interval not displayed.   Cardiac Enzymes: No results for input(s): CKTOTAL, CKMB, CKMBINDEX, TROPONINI in the last 8760 hours. BNP: Invalid input(s): POCBNP Lab Results  Component Value Date   HGBA1C 6.0 10/05/2016   Lab Results  Component  Value Date   TSH 5.72 05/03/2017   No results found for: VITAMINB12 No results found for: FOLATE No results found for: IRON, TIBC, FERRITIN  Lipid Panel: No results for input(s): CHOL, HDL, LDLCALC, TRIG, CHOLHDL, LDLDIRECT in the last 8760 hours. Lab Results  Component Value Date   HGBA1C 6.0 10/05/2016    Procedures since last visit: No results found.  Assessment/Plan  1. Morbid obesity ([place]) Difficult to lose weight with her OA and limited mobility. Dietary modification encouraged. activites as tolerated.  - CMP with eGFR; Future - Lipid Panel; Future - TSH; Future - CBC (no diff); Future - Hemoglobin A1c; Future  2. Hypothyroidism, unspecified type Continue levothyroxine empty stomach.  - TSH; Future  3. Essential hypertension Continue hctz, losartan and furosemide. Reviewed home BP.  -  CMP with eGFR; Future - Lipid Panel; Future - TSH; Future  4. CKD (chronic kidney disease) stage 3, GFR 30-59 ml/min (HCC) Monitor bmp - CMP with eGFR; Future - CBC (no diff); Future - Hemoglobin A1c; Future  5. Chronic acquired lymphedema Wear ted hose. Continue lasix and metolazone - Lipid Panel; Future - TSH; Future - CBC (no diff); Future  6. Prediabetes With her weight gain, check a1c, cut down on sweets and fried food.  - Hemoglobin A1c; Future  7. Skin rash Keep area clean and dry and then apply nystatin cream - nystatin cream (MYCOSTATIN); Apply 1 application topically 2 (two) times daily. To right posterior knee skin fold area with redness.  Dispense: 30 g; Refill: 0    Labs/tests ordered:  '@ORDERS' @  Next appointment:  Communication:    Blanchie Serve, MD Internal Medicine [place], [place] [address] Cell Phone (Monday-Friday 8 am - 5 pm): [phone] On Call: [phone] and follow prompts after 5 pm and on weekends Office Phone: [phone] Office Fax: [phone]

## 2017-05-22 NOTE — Patient Instructions (Signed)
  Continue to check BP readings at home  Wear your ted hose  Continue skin care as discussed in OV

## 2017-05-23 ENCOUNTER — Ambulatory Visit (INDEPENDENT_AMBULATORY_CARE_PROVIDER_SITE_OTHER): Payer: Medicare Other | Admitting: Vascular Surgery

## 2017-05-23 ENCOUNTER — Encounter: Payer: Self-pay | Admitting: Vascular Surgery

## 2017-05-23 VITALS — BP 118/72 | HR 92 | Temp 97.9°F | Resp 20 | Ht 65.0 in | Wt 289.0 lb

## 2017-05-23 DIAGNOSIS — I872 Venous insufficiency (chronic) (peripheral): Secondary | ICD-10-CM

## 2017-05-23 NOTE — Progress Notes (Signed)
Patient ID: Tamara Morales, female   DOB: Jun 30, 1930, 83 y.o.   MRN: [id number]  Reason for Consult: No chief complaint on file.   Referred by Blanchie Serve, MD  Subjective:     HPI:  Tamara Morales is a 83 y.o. female with history of right greater saphenous vein stripping.  She recently had a right lateral malleolar wound that is now healed with the help of wound care and compression stockings.  She is been wearing compression stockings for at least one month.  She walks minimally mostly gets around with a wheelchair.  She has never had a deep venous thrombosis.  She has never had intervention on her left sided veins.  She does take Lasix which has helped significantly with her edema.  She does have a history of knee replacement and also injury to the right ankle which has contributed to her pain and swelling in the right leg.  She was evaluated by the lymphedema clinic.  She has a reflux study performed in October.  Past Medical History:  Diagnosis Date  . ARTHRITIS, KNEES, BILATERAL   . Breast CA ([place]) 2000   s/p R lumpectomy and XRT  . DEPRESSION   . DJD (degenerative joint disease) of knee   . GERD   . HYPERLIPIDEMIA   . HYPERTENSION   . HYPOTHYROIDISM    postsurgical  . Hypoxia   . MIGRAINE HEADACHE   . Morbid obesity ([place])   . OSA (obstructive sleep apnea) 01/17/2011 dx  . Oxygen dependent   . Urge incontinence   . UTI (lower urinary tract infection) 12/2014   Family History  Problem Relation Age of Onset  . Emphysema Sister   . Coronary artery disease Neg Hx    Past Surgical History:  Procedure Laterality Date  . ABDOMINAL HYSTERECTOMY    . ADENOIDECTOMY    . BREAST BIOPSY  2000  . BREAST LUMPECTOMY  2000  . CATARACT EXTRACTION    . CHOLECYSTECTOMY  1995  . CYSTOSCOPY/URETEROSCOPY/HOLMIUM LASER Right 06/28/2015   Procedure: CYSTOSCOPY RIGHT RETROGRAD RIGHT URETEROSCOPY/HOLMIUM LASER WITH RIGHT STENT PLACEMENT;  Surgeon: Alexis Frock, MD;  Location: [facility];  Service: Urology;  Laterality: Right;  . PARATHYROIDECTOMY     2-3 removed  . REPLACEMENT TOTAL KNEE BILATERAL  1999, 2005  . Right leg femur repaired    . THYROIDECTOMY    . TONSILLECTOMY    . TUBAL LIGATION      Short Social History:  Social History   Tobacco Use  . Smoking status: Former Smoker    Packs/day: 0.25    Years: 2.00    Pack years: 0.50    Types: Cigarettes    Last attempt to quit: 06/19/1974    Years since quitting: 42.9  . Smokeless tobacco: Never Used  . Tobacco comment: Quit in late 20's  Substance Use Topics  . Alcohol use: No    Alcohol/week: 0.0 oz    Comment: rare    Allergies  Allergen Reactions  . Azithromycin Itching  . Beta Adrenergic Blockers     Depression   . Ciprofloxacin Other (See Comments)    REACTION: edgy and very jumpy   . Codeine Nausea And Vomiting  . Epinephrine Other (See Comments)    REACTION: rapid pulse, sweats  . Klonopin [Clonazepam] Other (See Comments)    Makes her feel very suicidal   . Oxycodone Other (See Comments)    Patient felt like it altered her mental status "Crazy" .  Hallucinations later as well.  [name] Paxil [Paroxetine Hydrochloride] Other (See Comments)    Sever depression   . Prednisone     "makes me feel really bad"  . Propoxyphene Hcl Nausea And Vomiting  . Torsemide   . Tramadol Other (See Comments)    Feels "jittery"  . Vicodin [Hydrocodone-Acetaminophen] Other (See Comments)    Hallucinations  . Meloxicam Diarrhea  . Vancomycin Rash    Localized rash related to infusion rate.    Current Outpatient Medications  Medication Sig Dispense Refill  . acetaminophen (TYLENOL) 325 MG tablet Take 650 mg by mouth every 8 (eight) hours as needed for mild pain or moderate pain.     [name] alum & mag hydroxide-simeth (MYLANTA) 200-200-20 MG/5ML suspension Take 30 mLs by mouth every 4 (four) hours as needed for indigestion or heartburn.    [name] aspirin 81 MG tablet Take 81 mg by mouth daily.     [name] docusate  sodium (COLACE) 100 MG capsule Take 1 capsule (100 mg total) by mouth 2 (two) times daily as needed for mild constipation or moderate constipation. 30 capsule 0  . furosemide (LASIX) 40 MG tablet Take 1 tablet (40 mg total) by mouth 2 (two) times daily. 30 tablet 3  . hydrochlorothiazide (HYDRODIURIL) 25 MG tablet TAKE 1 TABLET DAILY. 30 tablet 0  . levothyroxine (SYNTHROID, LEVOTHROID) 175 MCG tablet Take 175 mcg daily before breakfast by mouth.    . losartan (COZAAR) 100 MG tablet Take 1 tablet (100 mg total) by mouth daily. Hold for SBP less than 110. 90 tablet 1  . metolazone (ZAROXOLYN) 2.5 MG tablet Take 1 tablet (2.5 mg total) by mouth daily. 30 tablet 3  . nystatin cream (MYCOSTATIN) Apply 1 application topically 2 (two) times daily. To right posterior knee skin fold area with redness. 30 g 0  . omeprazole (PRILOSEC) 20 MG capsule TAKE (1) CAPSULE DAILY. 90 capsule 1  . ondansetron (ZOFRAN) 4 MG tablet Take 1 tablet (4 mg total) by mouth every 6 (six) hours as needed for nausea. 12 tablet 0  . sertraline (ZOLOFT) 50 MG tablet Take one and a half tablet daily for anxiety 30 tablet 3   No current facility-administered medications for this visit.     Review of Systems  Constitutional:  Constitutional negative. HENT: HENT negative.  Eyes: Eyes negative.  Respiratory: Positive for shortness of breath.  Cardiovascular: Positive for leg swelling.  GI: Gastrointestinal negative.  Musculoskeletal: Positive for joint pain.  Skin: Skin negative.  Neurological: Neurological negative. Hematologic: Hematologic/lymphatic negative.        Objective:  Objective   Vitals:   05/23/17 1105  BP: 118/72  Pulse: 92  Resp: 20  Temp: 97.9 F (36.6 C)  TempSrc: Oral  SpO2: 90%  Weight: 289 lb (131.1 kg)  Height: $Remove'5\' 5"'lapGcZo$  (1.651 m)   Body mass index is 48.09 kg/m.  Physical Exam  Constitutional: She is oriented to person, place, and time. She appears well-developed.  HENT:  Head:  Normocephalic.  Eyes: Pupils are equal, round, and reactive to light.  Cardiovascular:  Pulses:      Dorsalis pedis pulses are 2+ on the right side, and 2+ on the left side.  Pulmonary/Chest: Effort normal.  Musculoskeletal: She exhibits edema.  Neurological: She is alert and oriented to person, place, and time.  Skin:  Skin changes bilateral legs consistent with c4 venous disesase  Psychiatric: She has a normal mood and affect. Her behavior is normal. Judgment and thought content  normal.    Data: I reviewed her lower extremity venous duplex which demonstrates a stripped right great saphenous vein and reflux in the small saphenous vein on the right with diameter up to 0.46 cm.  On the left side she has reflux at the saphenofemoral junction and great saphenous vein with a greater saphenous vein up to 0.75 cm.     Assessment/Plan:     83 year old female with history of right greater saphenous vein stripping.  She has C5 venous disease currently managed with compression and diuresis.  She has reflux in her right small saphenous vein as well as left greater saphenous vein for much of its length.  These veins are also quite dilated.  She has been compression for 1 month and we will get her to follow-up in the vein center in 2 months for consideration of ablation.      Waynetta Sandy MD [facility]

## 2017-05-28 ENCOUNTER — Ambulatory Visit: Payer: Self-pay | Admitting: Acute Care

## 2017-05-30 ENCOUNTER — Telehealth: Payer: Self-pay | Admitting: Pulmonary Disease

## 2017-05-30 NOTE — Telephone Encounter (Signed)
rec'd fax from [facility], per [place] disregard fax.

## 2017-05-30 NOTE — Telephone Encounter (Signed)
Spoke with Tamara Morales with [facility] this morning. Per Tamara Morales the fax from [facility] regarding pt needing a qualified walk is no valid. It was sent by accident. Pt is only using O2 at night, not in need of qualifying walk. Disregarded the fax per Tamara Morales. Nothing further needed.

## 2017-06-06 NOTE — Telephone Encounter (Signed)
2nd attempt to reach pt to schedule AWV-I at [facility] clinic on either 06/07/17 or 06/15/17. VDM (DD)

## 2017-06-07 ENCOUNTER — Non-Acute Institutional Stay: Payer: Medicare Other

## 2017-06-07 VITALS — BP 124/73 | HR 96 | Temp 97.3°F | Ht 65.0 in | Wt 289.0 lb

## 2017-06-07 DIAGNOSIS — Z0000 Encounter for general adult medical examination without abnormal findings: Secondary | ICD-10-CM | POA: Diagnosis not present

## 2017-06-07 MED ORDER — ZOSTER VAC RECOMB ADJUVANTED 50 MCG/0.5ML IM SUSR: 0.5000 mL | Freq: Once | INTRAMUSCULAR | 1 refills | Status: AC

## 2017-06-07 NOTE — Progress Notes (Signed)
Subjective:   Tamara Morales is a 83 y.o. female who presents for an Initial Medicare Annual Wellness Visit.    Objective:    There were no vitals filed for this visit. There is no height or weight on file to calculate BMI.  Advanced Directives 04/20/2017 04/17/2017 04/09/2017 04/04/2017 03/29/2017 03/24/2017 02/01/2017  Does Patient Have a Medical Advance Directive? Yes Yes Yes Yes Yes No Yes  Type of Advance Directive - [place];Living will [place];Living will [place];Living will [place];Living will - [place]  Does patient want to make changes to medical advance directive? - No - Patient declined No - Patient declined No - Patient declined No - Patient declined - No - Patient declined  Copy of [place] in Chart? - Yes Yes Yes Yes - Yes  Would patient like information on creating a medical advance directive? - No - Patient declined - No - Patient declined - No - Patient declined No - Patient declined  Pre-existing out of facility DNR order (yellow form or pink MOST form) - - - - - - -    Current Medications (verified) Outpatient Encounter Medications as of 06/07/2017  Medication Sig  . acetaminophen (TYLENOL) 325 MG tablet Take 650 mg by mouth every 8 (eight) hours as needed for mild pain or moderate pain.   [name] alum & mag hydroxide-simeth (MYLANTA) 200-200-20 MG/5ML suspension Take 30 mLs by mouth every 4 (four) hours as needed for indigestion or heartburn.  [name] aspirin 81 MG tablet Take 81 mg by mouth daily.   [name] docusate sodium (COLACE) 100 MG capsule Take 1 capsule (100 mg total) by mouth 2 (two) times daily as needed for mild constipation or moderate constipation.  . furosemide (LASIX) 40 MG tablet Take 1 tablet (40 mg total) by mouth 2 (two) times daily.  . hydrochlorothiazide (HYDRODIURIL) 25 MG tablet TAKE 1 TABLET DAILY.  [name] levothyroxine (SYNTHROID,  LEVOTHROID) 175 MCG tablet Take 175 mcg daily before breakfast by mouth.  . losartan (COZAAR) 100 MG tablet Take 1 tablet (100 mg total) by mouth daily. Hold for SBP less than 110.  . metolazone (ZAROXOLYN) 2.5 MG tablet Take 1 tablet (2.5 mg total) by mouth daily.  [name] nystatin cream (MYCOSTATIN) Apply 1 application topically 2 (two) times daily. To right posterior knee skin fold area with redness.  [name] omeprazole (PRILOSEC) 20 MG capsule TAKE (1) CAPSULE DAILY.  [name] ondansetron (ZOFRAN) 4 MG tablet Take 1 tablet (4 mg total) by mouth every 6 (six) hours as needed for nausea.  . sertraline (ZOLOFT) 50 MG tablet Take one and a half tablet daily for anxiety   No facility-administered encounter medications on file as of 06/07/2017.     Allergies (verified) Azithromycin; Beta adrenergic blockers; Ciprofloxacin; Codeine; Epinephrine; Klonopin [clonazepam]; Oxycodone; Paxil [paroxetine hydrochloride]; Prednisone; Propoxyphene hcl; Torsemide; Tramadol; Vicodin [hydrocodone-acetaminophen]; Meloxicam; and Vancomycin   History: Past Medical History:  Diagnosis Date  . ARTHRITIS, KNEES, BILATERAL   . Breast CA ([place]) 2000   s/p R lumpectomy and XRT  . DEPRESSION   . DJD (degenerative joint disease) of knee   . GERD   . HYPERLIPIDEMIA   . HYPERTENSION   . HYPOTHYROIDISM    postsurgical  . Hypoxia   . MIGRAINE HEADACHE   . Morbid obesity ([place])   . OSA (obstructive sleep apnea) 01/17/2011 dx  . Oxygen dependent   . Urge incontinence   .  UTI (lower urinary tract infection) 12/2014   Past Surgical History:  Procedure Laterality Date  . ABDOMINAL HYSTERECTOMY    . ADENOIDECTOMY    . BREAST BIOPSY  2000  . BREAST LUMPECTOMY  2000  . CATARACT EXTRACTION    . CHOLECYSTECTOMY  1995  . CYSTOSCOPY/URETEROSCOPY/HOLMIUM LASER Right 06/28/2015   Procedure: CYSTOSCOPY RIGHT RETROGRAD RIGHT URETEROSCOPY/HOLMIUM LASER WITH RIGHT STENT PLACEMENT;  Surgeon: Alexis Frock, MD;  Location: [facility];  Service:  Urology;  Laterality: Right;  . PARATHYROIDECTOMY     2-3 removed  . REPLACEMENT TOTAL KNEE BILATERAL  1999, 2005  . Right leg femur repaired    . THYROIDECTOMY    . TONSILLECTOMY    . TUBAL LIGATION     Family History  Problem Relation Age of Onset  . Emphysema Sister   . Coronary artery disease Neg Hx    Social History   Socioeconomic History  . Marital status: Married    Spouse name: Not on file  . Number of children: 5  . Years of education: Not on file  . Highest education level: Not on file  Social Needs  . Financial resource strain: Not on file  . Food insecurity - worry: Not on file  . Food insecurity - inability: Not on file  . Transportation needs - medical: Not on file  . Transportation needs - non-medical: Not on file  Occupational History  . Occupation: RETIRED    Employer: RETIRED    Comment: worked on office  Tobacco Use  . Smoking status: Former Smoker    Packs/day: 0.25    Years: 2.00    Pack years: 0.50    Types: Cigarettes    Last attempt to quit: 06/19/1974    Years since quitting: 42.9  . Smokeless tobacco: Never Used  . Tobacco comment: Quit in late 20's  Substance and Sexual Activity  . Alcohol use: No    Alcohol/week: 0.0 oz    Comment: rare  . Drug use: No  . Sexual activity: No  Other Topics Concern  . Not on file  Social History Narrative   Married to husband that has dementia (he lives in the skilled nursing wing) and this is a big stressor. 5 children. 10 grandchildren. 2 greatgrandchildren. All children [place]      Lives at [facility], in independent living.      Retired from multiple different Community education officer, university, Psychologist, educational.       Hobbies: Management consultant, write poetry, craft dolls    Tobacco Counseling Counseling given: Not Answered Comment: Quit in late 20's   Clinical Intake:                        Activities of Daily Living No flowsheet data found.   Immunizations and Health  Maintenance Immunization History  Administered Date(s) Administered  . Influenza Split 03/20/2011  . Influenza Whole 03/19/2009, 03/19/2012  . Influenza,inj,Quad PF,6+ Mos 03/19/2013  . Influenza-Unspecified 03/03/2014, 03/15/2015, 03/30/2016, 04/05/2017  . Meningococcal Polysaccharide 03/15/2015  . PPD Test 03/30/2016  . Pneumococcal Conjugate-13 03/12/2014  . Pneumococcal Polysaccharide-23 06/19/2006, 03/15/2015  . Td 10/04/2009  . Tdap 06/19/2005  . Zoster 06/19/2010   There are no preventive care reminders to display for this patient.  Patient Care Team: Blanchie Serve, MD as PCP - General (Internal Medicine) Azucena Fallen, MD as Consulting Physician (Obstetrics and Gynecology) Chesley Mires, MD as Consulting Physician (Pulmonary Disease) Mast, Man X, NP as Nurse Practitioner (Internal  Medicine) Christin Fudge, MD as Consulting Physician (Surgery)  Indicate any recent Medical Services you may have received from other than [facility] providers in the past year (date may be approximate).     Assessment:   This is a routine wellness examination for [place].  Hearing/Vision screen No exam data present  Dietary issues and exercise activities discussed:    Goals    None     Depression Screen PHQ 2/9 Scores 11/27/2015 11/18/2015 07/29/2015 07/28/2015 07/03/2015 06/23/2015 07/29/2014  PHQ - 2 Score 0 0 1 0 0 0 0    Fall Risk Fall Risk  12/22/2016 04/06/2016 11/27/2015 11/18/2015 08/26/2015  Falls in the past year? Yes Yes No No No  Number falls in past yr: 1 1 - - -  Injury with Fall? No Yes - - -  Risk for fall due to : - - - - -    Is the patient's home free of loose throw rugs in walkways, pet beds, electrical cords, etc?   yes      Grab bars in the bathroom? yes      Handrails on the stairs?   yes      Adequate lighting?   yes  Timed Get Up and Go Performed unable to complete. Needs her 2 canes to walk  Cognitive Function: MMSE - Mini Mental State Exam 03/27/2017  Not completed:  (No Data)  Orientation to time 5  Orientation to Place 5  Registration 3  Attention/ Calculation 5  Recall 3  Language- name 2 objects 2  Language- repeat 1  Language- follow 3 step command 3  Language- read & follow direction 1  Write a sentence 1  Copy design 1  Total score 30        Screening Tests Health Maintenance  Topic Date Due  . DEXA SCAN  06/19/2017 (Originally 09/07/1997)  . TETANUS/TDAP  10/05/2019  . INFLUENZA VACCINE  Completed  . PNA vac Low Risk Adult  Completed    Qualifies for Shingles Vaccine? Yes, educated and prescription sent to pharmacy  Cancer Screenings: Lung: Low Dose CT Chest recommended if Age 47-80 years, 30 pack-year currently smoking OR have quit w/in 15years. Patient does not qualify. Breast: Up to date on Mammogram? Yes   Up to date of Bone Density/Dexa? No, declined Colorectal: up to date  Additional Screenings:  Hepatitis B/HIV/Syphillis:Not indicated Hepatitis C Screening: Not indicated     Plan:    I have personally reviewed and addressed the Medicare Annual Wellness questionnaire and have noted the following in the patient's chart:  A. Medical and social history B. Use of alcohol, tobacco or illicit drugs  C. Current medications and supplements D. Functional ability and status E.  Nutritional status F.  Physical activity G. Advance directives H. List of other physicians I.  Hospitalizations, surgeries, and ER visits in previous 12 months J.  [place] to include hearing, vision, cognitive, depression L. Referrals and appointments - none  In addition, I have reviewed and discussed with patient certain preventive protocols, quality metrics, and best practice recommendations. A written personalized care plan for preventive services as well as general preventive health recommendations were provided to patient.  See attached scanned questionnaire for additional information.   Signed,   Tyson Dense, RN Nurse  Health Advisor   Quick Notes   Health Maintenance: DEXA due and declined. Shingrix prescription sent to pharmacy.     Abnormal Screen:MMSE on 03/27/2017 30/30     Patient Concerns: Would  like a full medication review with next doctor     Nurse Concerns: none

## 2017-06-07 NOTE — Patient Instructions (Signed)
Tamara Morales , Thank you for taking time to come for your Medicare Wellness Visit. I appreciate your ongoing commitment to your health goals. Please review the following plan we discussed and let me know if I can assist you in the future.   Screening recommendations/referrals: Colonoscopy excluded, you are over age 81 Mammogram excluded, you are over age 60 Bone Density due, declined Recommended yearly ophthalmology/optometry visit for glaucoma screening and checkup Recommended yearly dental visit for hygiene and checkup  Vaccinations: Influenza vaccine up to date. Due 2019 fall season Pneumococcal vaccine up to date Tdap vaccine up to date. Due 10/05/2019 Shingles vaccine due, prescription sent to pharmacy    Advanced directives: In Chart  Conditions/risks identified: None  Next appointment: Dr. Bubba Camp 07/17/2017 @ 8:30am   Preventive Care 65 Years and Older, Female Preventive care refers to lifestyle choices and visits with your health care provider that can promote health and wellness. What does preventive care include?  A yearly physical exam. This is also called an annual well check.  Dental exams once or twice a year.  Routine eye exams. Ask your health care provider how often you should have your eyes checked.  Personal lifestyle choices, including:  Daily care of your teeth and gums.  Regular physical activity.  Eating a healthy diet.  Avoiding tobacco and drug use.  Limiting alcohol use.  Practicing safe sex.  Taking low-dose aspirin every day.  Taking vitamin and mineral supplements as recommended by your health care provider. What happens during an annual well check? The services and screenings done by your health care provider during your annual well check will depend on your age, overall health, lifestyle risk factors, and family history of disease. Counseling  Your health care provider may ask you questions about your:  Alcohol use.  Tobacco  use.  Drug use.  Emotional well-being.  Home and relationship well-being.  Sexual activity.  Eating habits.  History of falls.  Memory and ability to understand (cognition).  Work and work Statistician.  Reproductive health. Screening  You may have the following tests or measurements:  Height, weight, and BMI.  Blood pressure.  Lipid and cholesterol levels. These may be checked every 5 years, or more frequently if you are over 37 years old.  Skin check.  Lung cancer screening. You may have this screening every year starting at age 77 if you have a 30-pack-year history of smoking and currently smoke or have quit within the past 15 years.  Fecal occult blood test (FOBT) of the stool. You may have this test every year starting at age 11.  Flexible sigmoidoscopy or colonoscopy. You may have a sigmoidoscopy every 5 years or a colonoscopy every 10 years starting at age 9.  Hepatitis C blood test.  Hepatitis B blood test.  Sexually transmitted disease (STD) testing.  Diabetes screening. This is done by checking your blood sugar (glucose) after you have not eaten for a while (fasting). You may have this done every 1-3 years.  Bone density scan. This is done to screen for osteoporosis. You may have this done starting at age 41.  Mammogram. This may be done every 1-2 years. Talk to your health care provider about how often you should have regular mammograms. Talk with your health care provider about your test results, treatment options, and if necessary, the need for more tests. Vaccines  Your health care provider may recommend certain vaccines, such as:  Influenza vaccine. This is recommended every year.  Tetanus, diphtheria,  and acellular pertussis (Tdap, Td) vaccine. You may need a Td booster every 10 years.  Zoster vaccine. You may need this after age 50.  Pneumococcal 13-valent conjugate (PCV13) vaccine. One dose is recommended after age 14.  Pneumococcal  polysaccharide (PPSV23) vaccine. One dose is recommended after age 1. Talk to your health care provider about which screenings and vaccines you need and how often you need them. This information is not intended to replace advice given to you by your health care provider. Make sure you discuss any questions you have with your health care provider. Document Released: 07/02/2015 Document Revised: 02/23/2016 Document Reviewed: 04/06/2015 Elsevier Interactive Patient Education  2017 [place] Prevention in the Home Falls can cause injuries. They can happen to people of all ages. There are many things you can do to make your home safe and to help prevent falls. What can I do on the outside of my home?  Regularly fix the edges of walkways and driveways and fix any cracks.  Remove anything that might make you trip as you walk through a door, such as a raised step or threshold.  Trim any bushes or trees on the path to your home.  Use bright outdoor lighting.  Clear any walking paths of anything that might make someone trip, such as rocks or tools.  Regularly check to see if handrails are loose or broken. Make sure that both sides of any steps have handrails.  Any raised decks and porches should have guardrails on the edges.  Have any leaves, snow, or ice cleared regularly.  Use sand or salt on walking paths during winter.  Clean up any spills in your garage right away. This includes oil or grease spills. What can I do in the bathroom?  Use night lights.  Install grab bars by the toilet and in the tub and shower. Do not use towel bars as grab bars.  Use non-skid mats or decals in the tub or shower.  If you need to sit down in the shower, use a plastic, non-slip stool.  Keep the floor dry. Clean up any water that spills on the floor as soon as it happens.  Remove soap buildup in the tub or shower regularly.  Attach bath mats securely with double-sided non-slip rug  tape.  Do not have throw rugs and other things on the floor that can make you trip. What can I do in the bedroom?  Use night lights.  Make sure that you have a light by your bed that is easy to reach.  Do not use any sheets or blankets that are too big for your bed. They should not hang down onto the floor.  Have a firm chair that has side arms. You can use this for support while you get dressed.  Do not have throw rugs and other things on the floor that can make you trip. What can I do in the kitchen?  Clean up any spills right away.  Avoid walking on wet floors.  Keep items that you use a lot in easy-to-reach places.  If you need to reach something above you, use a strong step stool that has a grab bar.  Keep electrical cords out of the way.  Do not use floor polish or wax that makes floors slippery. If you must use wax, use non-skid floor wax.  Do not have throw rugs and other things on the floor that can make you trip. What can I do with my  stairs?  Do not leave any items on the stairs.  Make sure that there are handrails on both sides of the stairs and use them. Fix handrails that are broken or loose. Make sure that handrails are as long as the stairways.  Check any carpeting to make sure that it is firmly attached to the stairs. Fix any carpet that is loose or worn.  Avoid having throw rugs at the top or bottom of the stairs. If you do have throw rugs, attach them to the floor with carpet tape.  Make sure that you have a light switch at the top of the stairs and the bottom of the stairs. If you do not have them, ask someone to add them for you. What else can I do to help prevent falls?  Wear shoes that:  Do not have high heels.  Have rubber bottoms.  Are comfortable and fit you well.  Are closed at the toe. Do not wear sandals.  If you use a stepladder:  Make sure that it is fully opened. Do not climb a closed stepladder.  Make sure that both sides of the  stepladder are locked into place.  Ask someone to hold it for you, if possible.  Clearly mark and make sure that you can see:  Any grab bars or handrails.  First and last steps.  Where the edge of each step is.  Use tools that help you move around (mobility aids) if they are needed. These include:  Canes.  Walkers.  Scooters.  Crutches.  Turn on the lights when you go into a dark area. Replace any light bulbs as soon as they burn out.  Set up your furniture so you have a clear path. Avoid moving your furniture around.  If any of your floors are uneven, fix them.  If there are any pets around you, be aware of where they are.  Review your medicines with your doctor. Some medicines can make you feel dizzy. This can increase your chance of falling. Ask your doctor what other things that you can do to help prevent falls. This information is not intended to replace advice given to you by your health care provider. Make sure you discuss any questions you have with your health care provider. Document Released: 04/01/2009 Document Revised: 11/11/2015 Document Reviewed: 07/10/2014 Elsevier Interactive Patient Education  2017 [employer].

## 2017-06-26 DIAGNOSIS — I890 Lymphedema, not elsewhere classified: Secondary | ICD-10-CM | POA: Diagnosis not present

## 2017-06-26 DIAGNOSIS — N183 Chronic kidney disease, stage 3 (moderate): Secondary | ICD-10-CM | POA: Diagnosis not present

## 2017-06-26 DIAGNOSIS — I10 Essential (primary) hypertension: Secondary | ICD-10-CM | POA: Diagnosis not present

## 2017-06-26 DIAGNOSIS — R7303 Prediabetes: Secondary | ICD-10-CM | POA: Diagnosis not present

## 2017-06-26 DIAGNOSIS — E039 Hypothyroidism, unspecified: Secondary | ICD-10-CM | POA: Diagnosis not present

## 2017-06-27 ENCOUNTER — Ambulatory Visit (INDEPENDENT_AMBULATORY_CARE_PROVIDER_SITE_OTHER): Payer: Medicare Other | Admitting: Acute Care

## 2017-06-27 ENCOUNTER — Encounter: Payer: Self-pay | Admitting: Acute Care

## 2017-06-27 VITALS — BP 124/58 | HR 90 | Ht 61.0 in | Wt 289.8 lb

## 2017-06-27 DIAGNOSIS — R0982 Postnasal drip: Secondary | ICD-10-CM | POA: Insufficient documentation

## 2017-06-27 DIAGNOSIS — Z9989 Dependence on other enabling machines and devices: Secondary | ICD-10-CM

## 2017-06-27 DIAGNOSIS — G4733 Obstructive sleep apnea (adult) (pediatric): Secondary | ICD-10-CM

## 2017-06-27 DIAGNOSIS — E662 Morbid (severe) obesity with alveolar hypoventilation: Secondary | ICD-10-CM

## 2017-06-27 LAB — COMPLETE METABOLIC PANEL WITH GFR
AG RATIO: 1.4 (calc) (ref 1.0–2.5)
ALBUMIN MSPROF: 3.7 g/dL (ref 3.6–5.1)
ALT: 16 U/L (ref 6–29)
AST: 16 U/L (ref 10–35)
Alkaline phosphatase (APISO): 106 U/L (ref 33–130)
BILIRUBIN TOTAL: 0.6 mg/dL (ref 0.2–1.2)
BUN / CREAT RATIO: 21 (calc) (ref 6–22)
BUN: 19 mg/dL (ref 7–25)
CO2: 28 mmol/L (ref 20–32)
Calcium: 8.7 mg/dL (ref 8.6–10.4)
Chloride: 103 mmol/L (ref 98–110)
Creat: 0.89 mg/dL — ABNORMAL HIGH (ref 0.60–0.88)
GFR, EST AFRICAN AMERICAN: 69 mL/min/{1.73_m2} (ref >=60)
GFR, Est Non African American: 60 mL/min/{1.73_m2} (ref >=60)
Globulin: 2.7 g/dL (calc) (ref 1.9–3.7)
Glucose, Bld: 107 mg/dL — ABNORMAL HIGH (ref 65–99)
POTASSIUM: 3.5 mmol/L (ref 3.5–5.3)
SODIUM: 141 mmol/L (ref 135–146)
TOTAL PROTEIN: 6.4 g/dL (ref 6.1–8.1)

## 2017-06-27 LAB — CBC
HEMATOCRIT: 39.3 % (ref 35.0–45.0)
Hemoglobin: 13.3 g/dL (ref 11.7–15.5)
MCH: 28.7 pg (ref 27.0–33.0)
MCHC: 33.8 g/dL (ref 32.0–36.0)
MCV: 84.9 fL (ref 80.0–100.0)
MPV: 11.3 fL (ref 7.5–12.5)
Platelets: 147 10*3/uL (ref 140–400)
RBC: 4.63 10*6/uL (ref 3.80–5.10)
RDW: 17 % — AB (ref 11.0–15.0)
WBC: 6.2 10*3/uL (ref 3.8–10.8)

## 2017-06-27 LAB — LIPID PANEL
Cholesterol: 171 mg/dL (ref <200)
HDL: 45 mg/dL — AB (ref >50)
LDL Cholesterol (Calc): 104 mg/dL (calc) — ABNORMAL HIGH
NON-HDL CHOLESTEROL (CALC): 126 mg/dL (ref <130)
TRIGLYCERIDES: 128 mg/dL (ref <150)
Total CHOL/HDL Ratio: 3.8 (calc) (ref <5.0)

## 2017-06-27 LAB — HEMOGLOBIN A1C
HEMOGLOBIN A1C: 6.3 %{Hb} — AB (ref <5.7)
Mean Plasma Glucose: 134 (calc)
eAG (mmol/L): 7.4 (calc)

## 2017-06-27 LAB — TSH: TSH: 0.17 mIU/L — ABNORMAL LOW (ref 0.40–4.50)

## 2017-06-27 NOTE — Progress Notes (Addendum)
History of Present Illness Tamara Morales is a 84 y.o. female with OSA  On Auto  CPAP. She is followed by Dr. Halford Chessman.  PCP: Blanchie Serve, MD Patient Care Team: Blanchie Serve, MD as PCP - General (Internal Medicine) Josue Hector, MD as Consulting Physician (Cardiology) Azucena Fallen, MD as Consulting Physician (Obstetrics and Gynecology) Chesley Mires, MD as Consulting Physician (Pulmonary Disease) Mast, Man X, NP as Nurse Practitioner (Internal Medicine)   06/29/2017 Follow up OV : Pt. Presents for follow up. Pt. States she She states she is here for a qualifying walk for oxygen, but she is in a wheel chair.She states she is doing well at present . She uses oxygen at night, bled into her CPAP. She states she sleeps in her recliner and has been sleeping in her recliner x 3 years. She states she has had no issues with her CPAP machine.Per [facility], she needs a CPAP titration study on room air  to re-qualify for her oxygen. She uses her CPAP every night. She uses her oxygen every night. She is unable to sleep without them as her oxygen levels drop. She states she does have PND, and is not using an antihistamine. She states she has a cough at intervals due to the PND. She denies fever, chest pain, orthopnea or hemoptysis.  Test Results:  Sleep tests PSG 01/22/11 >> AHI 74.3, SpO2 low 76%. ONO with RA and CPAP 04/21/11 >> Test time 5 hr 12 min. Basal SpO2 87%, low SpO2 73%. Spent 161 min with SpO2 < 88% Auto CPAP 05/22/15 to 08/19/15 >> used on 85 of 90 nights with average 6 hrs and 48 min.  Average AHI is 0.8 with median CPAP 5 cm H2O and 95 th percentile CPAP 6 cm H20. ONO with CPAP and RA 09/09/15 >>test time 7 hrs 58 min. Basal SpO2 91%. Low SpO2 80%. Spent 40.7 min with SpO2 <88%  Cardiac tests Echo 04/01/13 >> EF 55 to 60%, PAS 43 mmHg  Pulmonary tests CT chest 11/03/14 >> atherosclerosis, patchy atelectasis, scoliosis, old Rt fib fx Spirometry 2015>> no obstruction noted,  + mild decrease in FVC most likely related to restriction from morbid obesity.   Past medical history HA, OA, Depression GERD, HLD, HTN, Breast cancer in 2000, Hypothyroidism   CBC Latest Ref Rng & Units 06/26/2017 04/03/2017 01/18/2017  WBC 3.8 - 10.8 Thousand/uL 6.2 6.8 6.3  Hemoglobin 11.7 - 15.5 g/dL 13.3 13.0 13.0  Hematocrit 35.0 - 45.0 % 39.3 39 40  Platelets 140 - 400 Thousand/uL 147 204 142(A)    BMP Latest Ref Rng & Units 06/26/2017 05/03/2017 04/10/2017  Glucose 65 - 99 mg/dL 107(H) - -  BUN 7 - 25 mg/dL 19 - 28(A)  Creatinine 0.60 - 0.88 mg/dL 0.89(H) - 0.9  BUN/Creat Ratio 6 - 22 (calc) 21 - -  Sodium 135 - 146 mmol/L 141 - 140  Potassium 3.5 - 5.3 mmol/L 3.5 3.4 3.4  Chloride 98 - 110 mmol/L 103 - -  CO2 20 - 32 mmol/L 28 - -  Calcium 8.6 - 10.4 mg/dL 8.7 - -    BNP    Component Value Date/Time   BNP 149.7 (H) 01/07/2015 1745    ProBNP    Component Value Date/Time   PROBNP 106.0 08/21/2013 0845      Past medical hx Past Medical History:  Diagnosis Date  . ARTHRITIS, KNEES, BILATERAL   . Breast CA ([place]) 2000   s/p R lumpectomy and XRT  .  DEPRESSION   . DJD (degenerative joint disease) of knee   . GERD   . HYPERLIPIDEMIA   . HYPERTENSION   . HYPOTHYROIDISM    postsurgical  . Hypoxia   . MIGRAINE HEADACHE   . Morbid obesity (HCC)   . OSA (obstructive sleep apnea) 01/17/2011 dx  . Oxygen dependent   . Urge incontinence   . UTI (lower urinary tract infection) 12/2014     Social History   Tobacco Use  . Smoking status: Former Smoker    Packs/day: 0.25    Years: 2.00    Pack years: 0.50    Types: Cigarettes    Last attempt to quit: 06/19/1974    Years since quitting: 43.0  . Smokeless tobacco: Never Used  . Tobacco comment: Quit in late 20's  Substance Use Topics  . Alcohol use: No    Alcohol/week: 0.0 oz    Comment: rare  . Drug use: No    Ms.Mellette reports that she quit smoking about 43 years ago. Her smoking use included cigarettes.  She has a 0.50 pack-year smoking history. she has never used smokeless tobacco. She reports that she does not drink alcohol or use drugs.  Tobacco Cessation: Former smoker, Quit 1976  Past surgical hx, Family hx, Social hx all reviewed.  Current Outpatient Medications on File Prior to Visit  Medication Sig  . acetaminophen (TYLENOL) 325 MG tablet Take 650 mg by mouth every 8 (eight) hours as needed for mild pain or moderate pain.   [name] aspirin 81 MG tablet Take 81 mg by mouth daily.   [name] docusate sodium (COLACE) 100 MG capsule Take 1 capsule (100 mg total) by mouth 2 (two) times daily as needed for mild constipation or moderate constipation.  . furosemide (LASIX) 40 MG tablet Take 1 tablet (40 mg total) by mouth 2 (two) times daily.  . hydrochlorothiazide (HYDRODIURIL) 25 MG tablet TAKE 1 TABLET DAILY.  [name] levothyroxine (SYNTHROID, LEVOTHROID) 175 MCG tablet Take 175 mcg daily before breakfast by mouth.  . losartan (COZAAR) 100 MG tablet Take 1 tablet (100 mg total) by mouth daily. Hold for SBP less than 110.  . metolazone (ZAROXOLYN) 2.5 MG tablet Take 1 tablet (2.5 mg total) by mouth daily.  [name] nystatin cream (MYCOSTATIN) Apply 1 application topically 2 (two) times daily. To right posterior knee skin fold area with redness.  [name] omeprazole (PRILOSEC) 20 MG capsule TAKE (1) CAPSULE DAILY.  [name] ondansetron (ZOFRAN) 4 MG tablet Take 1 tablet (4 mg total) by mouth every 6 (six) hours as needed for nausea.  . potassium chloride (KLOR-CON) 20 MEQ packet Take 60 mEq by mouth every morning.  . potassium chloride (KLOR-CON) 20 MEQ packet Take 40 mEq by mouth at bedtime.  . sertraline (ZOLOFT) 50 MG tablet Take one and a half tablet daily for anxiety   No current facility-administered medications on file prior to visit.      Allergies  Allergen Reactions  . Azithromycin Itching  . Beta Adrenergic Blockers     Depression   . Ciprofloxacin Other (See Comments)    REACTION: edgy and very jumpy   .  Codeine Nausea And Vomiting  . Epinephrine Other (See Comments)    REACTION: rapid pulse, sweats  . Klonopin [Clonazepam] Other (See Comments)    Makes her feel very suicidal   . Oxycodone Other (See Comments)    Patient felt like it altered her mental status "Crazy" . Hallucinations later as well.  [name] Paxil [Paroxetine Hydrochloride]  Other (See Comments)    Sever depression   . Prednisone     "makes me feel really bad"  . Propoxyphene Hcl Nausea And Vomiting  . Torsemide   . Tramadol Other (See Comments)    Feels "jittery"  . Vicodin [Hydrocodone-Acetaminophen] Other (See Comments)    Hallucinations  . Meloxicam Diarrhea  . Vancomycin Rash    Localized rash related to infusion rate.    Review Of Systems:  Constitutional:   No  weight loss, night sweats,  Fevers, chills, fatigue, or  lassitude.  HEENT:   No headaches,  Difficulty swallowing,  Tooth/dental problems, or  Sore throat,                No sneezing, itching, ear ache, nasal congestion,+ post nasal drip,   CV:  No chest pain,  Orthopnea, PND, swelling in lower extremities, anasarca, dizziness, palpitations, syncope.   GI  No heartburn, indigestion, abdominal pain, nausea, vomiting, diarrhea, change in bowel habits, loss of appetite, bloody stools.   Resp: shortness of breath with exertion less  at rest.  No excess mucus, no productive cough,  No non-productive cough,  No coughing up of blood.  No change in color of mucus.  No wheezing.  No chest wall deformity  Skin: no rash or lesions.  GU: no dysuria, change in color of urine, no urgency or frequency.  No flank pain, no hematuria   MS:  No joint pain or swelling.  No decreased range of motion.  No back pain.  Psych:  No change in mood or affect. No depression or anxiety.  No memory loss.   Vital Signs BP (!) 124/58 (BP Location: Left Arm, Cuff Size: Normal)   Pulse 90   Ht 5\' 1"  (1.549 m)   Wt 289 lb 12.8 oz (131.5 kg)   SpO2 95%   BMI 54.76 kg/m     Physical Exam:  General- No distress,  A&Ox3, pleasant elderly female ENT: No sinus tenderness, TM clear, pale nasal mucosa, no oral exudate,+ post nasal drip, no LAN Cardiac: S1, S2, regular rate and rhythm, no murmur Chest: No wheeze/ rales/ dullness; no accessory muscle use, no nasal flaring, no sternal retractions, diminished per bases Abd.: Soft Non-tender, non-distended, obese Ext: No clubbing cyanosis, edema Neuro: deconditioned at baseline Skin: No rashes, warm and dry Psych: normal mood and behavior   Assessment/Plan  OSA on CPAP Needs CPAP titration on RA to meet insurance needs for continued therapy Plan: We will have [facility] send you shorter oxygen tubing. We will start Claritin 10 mg for post nasal drip. Sugar Free Hard candy for throat soothing. Sip water instead of throat clearing. We will schedule you for a CPAP titration study  using the oxygen protocol  to re-qualify you for your oxygen. Mail [place] the sim card from your CPAP machine so we can confirm compliance and no break in therapy. We will call you with the results. Follow up with Dr. Halford Chessman in 12 months. Please contact office for sooner follow up if symptoms do not improve or worsen or seek emergency care    Post-nasal drip We will start Claritin 10 mg for post nasal drip. Sugar Free Hard candy for throat soothing. Sip water instead of throat clearing. Follow up with Dr. Halford Chessman in 12 months. Please contact office for sooner follow up if symptoms do not improve or worsen or seek emergency care    Obesity hypoventilation syndrome (HCC) Continue oxygen 2 L  with  exertion.  Last walk indicated sat of 83% with exertion    Magdalen Spatz, NP 06/29/2017  1:56 PM

## 2017-06-27 NOTE — Patient Instructions (Addendum)
It is good to meet you today. We will have [facility] send you shorter oxygen tubing. We will start Claritin 10 mg for post nasal drip. Sugar Free Hard candy for throat soothing. Sip water instead of throat clearing. We will schedule you for a CPAP titration study  using the oxygen protocol  to re-qualify you for your oxygen. Mail [place] the sim card from your CPAP machine so we can confirm compliance and no break in therapy. We will call you with the results. Follow up with Dr. Halford Chessman in 12 months. Please contact office for sooner follow up if symptoms do not improve or worsen or seek emergency care

## 2017-06-27 NOTE — Assessment & Plan Note (Signed)
We will start Claritin 10 mg for post nasal drip. Sugar Free Hard candy for throat soothing. Sip water instead of throat clearing. Follow up with Dr. Halford Chessman in 12 months. Please contact office for sooner follow up if symptoms do not improve or worsen or seek emergency care

## 2017-06-27 NOTE — Assessment & Plan Note (Signed)
Needs CPAP titration on RA to meet insurance needs for continued therapy Plan: We will have [facility] send you shorter oxygen tubing. We will start Claritin 10 mg for post nasal drip. Sugar Free Hard candy for throat soothing. Sip water instead of throat clearing. We will schedule you for a CPAP titration study  using the oxygen protocol  to re-qualify you for your oxygen. Mail [place] the sim card from your CPAP machine so we can confirm compliance and no break in therapy. We will call you with the results. Follow up with Dr. Halford Chessman in 12 months. Please contact office for sooner follow up if symptoms do not improve or worsen or seek emergency care

## 2017-06-29 ENCOUNTER — Telehealth: Payer: Self-pay | Admitting: Pulmonary Disease

## 2017-06-29 ENCOUNTER — Telehealth: Payer: Self-pay | Admitting: Acute Care

## 2017-06-29 NOTE — Telephone Encounter (Signed)
Spoke with Tamara Morales she wants to know if they need to continue oxygen during the day and at night? SG I remember this pt and she was not wearing oxygen when she came in. She was unable to walk for a qualifying walk but [facility] needs clarification because if she does not need the O2 they will have to take her portable from her. I spoke with pt, she states she needs the oxygen when she is lying flat. She does not need it during the day continuously. Is it ok for her to keep POC? Please advise.

## 2017-06-29 NOTE — Telephone Encounter (Signed)
Please call [facility] and see if they have PT or nursing  who can walk patient on RA to see if she drops below 88% to qualify her. She has OHS, and should have oxygen when she lays down. Thanks so much.

## 2017-06-29 NOTE — Telephone Encounter (Signed)
Spoke with Corene Cornea and there is a previous message about this pt so I will close this encounter.

## 2017-06-29 NOTE — Assessment & Plan Note (Signed)
Continue oxygen 2 L  with exertion.  Last walk indicated sat of 83% with exertion

## 2017-06-29 NOTE — Telephone Encounter (Signed)
I called [facility] and had to leave a message to see if a nurse there could qualify her for the oxygen. I called [facility] and left her a message to call back as well.

## 2017-07-02 ENCOUNTER — Encounter: Payer: Self-pay | Admitting: Internal Medicine

## 2017-07-02 NOTE — Telephone Encounter (Signed)
[place] at [phone]. I was directed to nurses station. There was no answer and I could not leave a message. Will try back.

## 2017-07-03 ENCOUNTER — Non-Acute Institutional Stay: Payer: Medicare Other | Admitting: Internal Medicine

## 2017-07-03 ENCOUNTER — Encounter: Payer: Self-pay | Admitting: Internal Medicine

## 2017-07-03 VITALS — BP 126/68 | HR 89 | Temp 97.3°F | Resp 16 | Ht 61.0 in | Wt 290.4 lb

## 2017-07-03 DIAGNOSIS — E890 Postprocedural hypothyroidism: Secondary | ICD-10-CM

## 2017-07-03 DIAGNOSIS — R7303 Prediabetes: Secondary | ICD-10-CM | POA: Diagnosis not present

## 2017-07-03 DIAGNOSIS — J309 Allergic rhinitis, unspecified: Secondary | ICD-10-CM

## 2017-07-03 DIAGNOSIS — E785 Hyperlipidemia, unspecified: Secondary | ICD-10-CM | POA: Diagnosis not present

## 2017-07-03 MED ORDER — LORATADINE 10 MG PO TABS: 10.0000 mg | ORAL_TABLET | Freq: Every day | ORAL | 0 refills | Status: DC

## 2017-07-03 MED ORDER — LEVOTHYROXINE SODIUM 125 MCG PO TABS: 125.0000 ug | ORAL_TABLET | Freq: Every day | ORAL | 3 refills | Status: DC

## 2017-07-03 NOTE — Progress Notes (Signed)
[facility]  Provider: Blanchie Serve MD   Location:  [place] of Service:     PCP: Blanchie Serve, MD Patient Care Team: Blanchie Serve, MD as PCP - General (Internal Medicine) Tamara Fallen, MD as Consulting Physician (Obstetrics and Gynecology) Chesley Mires, MD as Consulting Physician (Pulmonary Disease) Mast, Man X, NP as Nurse Practitioner (Internal Medicine) Christin Fudge, MD as Consulting Physician (Surgery)  Extended Emergency Contact Information Primary Emergency Contact: Chauncey,Amy Address: [address]          [place], [place] [address] Johnnette Litter of [place] Phone: [phone] Relation: Daughter Secondary Emergency Contact: Boxman,Lee Address: [address]          [place], [place] [address] Johnnette Litter of [place] Phone: [phone] Mobile Phone: [phone] Relation: Daughter  Goals of Care: Advanced Directive information Advanced Directives 06/07/2017  Does Patient Have a Medical Advance Directive? Yes  Type of Paramedic of [place];Living will  Does patient want to make changes to medical advance directive? No - Patient declined  Copy of [place] in Chart? Yes  Would patient like information on creating a medical advance directive? -  Pre-existing out of facility DNR order (yellow form or pink MOST form) -      Chief Complaint  Patient presents with  . Acute Visit    abnormal labs, jitteriness  . Medication Refill    No refills needed at this time    HPI: Patient is a 84 y.o. female seen today for acute visit.   Abnormal TSH- on lab follow up, low TSH. Pt complaints of feeling restless and jittery for few weeks now. occassional palpitations. Denies chest pain or diaphoresis.   Abnormal a1c- elevated fasting blood glucose and a1c. Has history of HTN and obesity.  Leg edema- stable, taking her diuretics, wears her compression  stockings.   Past Medical History:  Diagnosis Date  . ARTHRITIS, KNEES, BILATERAL   . Breast CA ([place]) 2000   s/p R lumpectomy and XRT  . DEPRESSION   . DJD (degenerative joint disease) of knee   . GERD   . HYPERLIPIDEMIA   . HYPERTENSION   . HYPOTHYROIDISM    postsurgical  . Hypoxia   . MIGRAINE HEADACHE   . Morbid obesity ([place])   . OSA (obstructive sleep apnea) 01/17/2011 dx  . Oxygen dependent   . Urge incontinence   . UTI (lower urinary tract infection) 12/2014   Past Surgical History:  Procedure Laterality Date  . ABDOMINAL HYSTERECTOMY    . ADENOIDECTOMY    . BREAST BIOPSY  2000  . BREAST LUMPECTOMY  2000  . CATARACT EXTRACTION    . CHOLECYSTECTOMY  1995  . CYSTOSCOPY/URETEROSCOPY/HOLMIUM LASER Right 06/28/2015   Procedure: CYSTOSCOPY RIGHT RETROGRAD RIGHT URETEROSCOPY/HOLMIUM LASER WITH RIGHT STENT PLACEMENT;  Surgeon: Alexis Frock, MD;  Location: [facility];  Service: Urology;  Laterality: Right;  . PARATHYROIDECTOMY     2-3 removed  . REPLACEMENT TOTAL KNEE BILATERAL  1999, 2005  . Right leg femur repaired    . THYROIDECTOMY    . TONSILLECTOMY    . TUBAL LIGATION      reports that she quit smoking about 43 years ago. Her smoking use included cigarettes. She has a 0.50 pack-year smoking history. she has never used smokeless tobacco. She reports that she does not drink alcohol or use drugs. Social History   Socioeconomic History  . Marital status: Married  Spouse name: Not on file  . Number of children: 5  . Years of education: Not on file  . Highest education level: Not on file  Social Needs  . Financial resource strain: Not hard at all  . Food insecurity - worry: Never true  . Food insecurity - inability: Never true  . Transportation needs - medical: No  . Transportation needs - non-medical: No  Occupational History  . Occupation: RETIRED    Employer: RETIRED    Comment: worked on office  Tobacco Use  . Smoking status: Former Smoker    Packs/day: 0.25     Years: 2.00    Pack years: 0.50    Types: Cigarettes    Last attempt to quit: 06/19/1974    Years since quitting: 43.0  . Smokeless tobacco: Never Used  . Tobacco comment: Quit in late 20's  Substance and Sexual Activity  . Alcohol use: No    Alcohol/week: 0.0 oz    Comment: rare  . Drug use: No  . Sexual activity: No  Other Topics Concern  . Not on file  Social History Narrative   Married to husband that has dementia (he lives in the skilled nursing wing) and this is a big stressor. 5 children. 10 grandchildren. 2 greatgrandchildren. All children [place]      Lives at [facility], in independent living.      Retired from multiple different Community education officer, university, Psychologist, educational.       Hobbies: Management consultant, write poetry, craft dolls     Family History  Problem Relation Age of Onset  . Emphysema Sister   . Coronary artery disease Neg Hx     Health Maintenance  Topic Date Due  . DEXA SCAN  09/07/1997  . TETANUS/TDAP  10/05/2019  . INFLUENZA VACCINE  Completed  . PNA vac Low Risk Adult  Completed    Allergies  Allergen Reactions  . Azithromycin Itching  . Beta Adrenergic Blockers     Depression   . Ciprofloxacin Other (See Comments)    REACTION: edgy and very jumpy   . Codeine Nausea And Vomiting  . Epinephrine Other (See Comments)    REACTION: rapid pulse, sweats  . Klonopin [Clonazepam] Other (See Comments)    Makes her feel very suicidal   . Oxycodone Other (See Comments)    Patient felt like it altered her mental status "Crazy" . Hallucinations later as well.  [name] Paxil [Paroxetine Hydrochloride] Other (See Comments)    Sever depression   . Prednisone     "makes me feel really bad"  . Propoxyphene Hcl Nausea And Vomiting  . Torsemide   . Tramadol Other (See Comments)    Feels "jittery"  . Vicodin [Hydrocodone-Acetaminophen] Other (See Comments)    Hallucinations  . Meloxicam Diarrhea  . Vancomycin Rash    Localized rash related to infusion  rate.    Outpatient Encounter Medications as of 07/03/2017  Medication Sig  . acetaminophen (TYLENOL) 325 MG tablet Take 650 mg by mouth every 8 (eight) hours as needed for mild pain or moderate pain.   [name] aspirin 81 MG tablet Take 81 mg by mouth daily.   [name] docusate sodium (COLACE) 100 MG capsule Take 1 capsule (100 mg total) by mouth 2 (two) times daily as needed for mild constipation or moderate constipation.  . furosemide (LASIX) 40 MG tablet Take 1 tablet (40 mg total) by mouth 2 (two) times daily.  . hydrochlorothiazide (HYDRODIURIL) 25 MG tablet TAKE 1 TABLET  DAILY.  . losartan (COZAAR) 100 MG tablet Take 1 tablet (100 mg total) by mouth daily. Hold for SBP less than 110.  . metolazone (ZAROXOLYN) 2.5 MG tablet Take 1 tablet (2.5 mg total) by mouth daily.  [name] nystatin cream (MYCOSTATIN) Apply 1 application topically 2 (two) times daily. To right posterior knee skin fold area with redness.  . nystatin cream (MYCOSTATIN) Apply 1 application topically as needed for dry skin.  [name] omeprazole (PRILOSEC) 20 MG capsule TAKE (1) CAPSULE DAILY.  [name] ondansetron (ZOFRAN) 4 MG tablet Take 1 tablet (4 mg total) by mouth every 6 (six) hours as needed for nausea.  . potassium chloride (KLOR-CON) 20 MEQ packet Take 60 mEq by mouth every morning.  . potassium chloride (KLOR-CON) 20 MEQ packet Take 40 mEq by mouth at bedtime.  . sertraline (ZOLOFT) 50 MG tablet Take one and a half tablet daily for anxiety  . [DISCONTINUED] levothyroxine (SYNTHROID, LEVOTHROID) 175 MCG tablet Take 175 mcg daily before breakfast by mouth.  . levothyroxine (SYNTHROID, LEVOTHROID) 125 MCG tablet Take 1 tablet (125 mcg total) by mouth daily.  [name] loratadine (CLARITIN) 10 MG tablet Take 1 tablet (10 mg total) by mouth daily.   No facility-administered encounter medications on file as of 07/03/2017.     Review of Systems  Constitutional: Negative for appetite change and fever.  HENT: Positive for postnasal drip and rhinorrhea.  Negative for congestion, ear pain and mouth sores.   Respiratory: Negative for cough and shortness of breath.   Cardiovascular: Positive for palpitations and leg swelling. Negative for chest pain.  Gastrointestinal: Negative for abdominal pain.  Musculoskeletal: Positive for gait problem.  Neurological: Negative for dizziness and headaches.    Vitals:   07/03/17 1212  BP: 126/68  Pulse: 89  Resp: 16  Temp: (!) 97.3 F (36.3 C)  TempSrc: Oral  SpO2: 93%  Weight: 290 lb 6.4 oz (131.7 kg)  Height: $Remove'5\' 1"'ptaWmWK$  (1.549 m)   Body mass index is 54.87 kg/m.   Wt Readings from Last 3 Encounters:  07/03/17 290 lb 6.4 oz (131.7 kg)  06/27/17 289 lb 12.8 oz (131.5 kg)  06/07/17 289 lb (131.1 kg)   Physical Exam  Constitutional: She is oriented to person, place, and time. No distress.  Morbidly obese   HENT:  Head: Normocephalic and atraumatic.  Mouth/Throat: Oropharynx is clear and moist.  Eyes: Conjunctivae are normal. Pupils are equal, round, and reactive to light. Right eye exhibits no discharge. Left eye exhibits no discharge.  Neck: Normal range of motion. Neck supple.  Cardiovascular: Normal rate and regular rhythm.  Pulmonary/Chest: Effort normal and breath sounds normal. She has no wheezes. She has no rales.  Abdominal: Soft. Bowel sounds are normal. There is no tenderness.  Musculoskeletal: She exhibits edema.  Wheelchair bound, limited mobility  Lymphadenopathy:    She has no cervical adenopathy.  Neurological: She is alert and oriented to person, place, and time.  Skin: Skin is warm and dry. She is not diaphoretic.  Psychiatric: She has a normal mood and affect.    Labs reviewed: Basic Metabolic Panel: Recent Labs    01/12/17 0307  03/15/17 0800 04/03/17 04/10/17 05/03/17 06/26/17 0655  NA 139   < > 142 140 140  --  141  K 4.0   < > 3.4* 2.9* 3.4 3.4 3.5  CL 103  --  100  --   --   --  103  CO2 29  --  34*  --   --   --  28  GLUCOSE 107*  --  102*  --   --   --   107*  BUN 14   < > 26* 25* 28*  --  19  CREATININE 0.93   < > 0.98* 1.0 0.9  --  0.89*  CALCIUM 8.6*  --  8.5*  --   --   --  8.7   < > = values in this interval not displayed.   Liver Function Tests: Recent Labs    01/12/17 0307 01/18/17 04/03/17 06/26/17 0655  AST 15 10* 16 16  ALT 13* $Remov'9 10 16  'xMCvgb$ ALKPHOS 73 77 86  --   BILITOT 0.7  --   --  0.6  PROT 6.5  --   --  6.4  ALBUMIN 3.4*  --   --   --    No results for input(s): LIPASE, AMYLASE in the last 8760 hours. No results for input(s): AMMONIA in the last 8760 hours. CBC: Recent Labs    11/02/16 2043  01/12/17 0307 01/18/17 04/03/17 06/26/17 0655  WBC 7.2   < > 6.1 6.3 6.8 6.2  NEUTROABS  --   --  4.2  --   --   --   HGB 13.0   < > 12.8 13.0 13.0 13.3  HCT 39.9   < > 38.5 40 39 39.3  MCV 90.5  --  89.1  --   --  84.9  PLT 143*   < > 158 142* 204 147   < > = values in this interval not displayed.   Cardiac Enzymes: No results for input(s): CKTOTAL, CKMB, CKMBINDEX, TROPONINI in the last 8760 hours. BNP: Invalid input(s): POCBNP Lab Results  Component Value Date   HGBA1C 6.3 (H) 06/26/2017   Lab Results  Component Value Date   TSH 0.17 (L) 06/26/2017   No results found for: VITAMINB12 No results found for: FOLATE No results found for: IRON, TIBC, FERRITIN  Lipid Panel: Recent Labs    06/26/17 0655  CHOL 171  HDL 45*  TRIG 128  CHOLHDL 3.8   Lab Results  Component Value Date   HGBA1C 6.3 (H) 06/26/2017    Procedures since last visit: No results found.  Assessment/Plan  1. Postoperative hypothyroidism Has hypothyroidism, levothyroxine was increased to 175 mcg in November 2018. Low TSH likely iatrogenic. Decrease levothyroxine to 125 mcg daily for now and monitor TSH and free T4 - TSH; Future - T4, Free; Future - Lipid Panel; Future - Hemoglobin A1c; Future  2. Prediabetes MONITOR A1C. Dietary counselling provided.  - Hemoglobin A1c; Future  3. Hyperlipidemia LDL goal <100 Dietary  counselling for now. If LDL remains elevated consider statin with her history of HTN, morbid obesity and prediabetes.  - Lipid Panel; Future  4. Allergic rhinitis With PND. Start claritin 10 mg daily x 1 week and then daily as needed.     Labs/tests ordered:  $RemoveB'@ORDERS'oQcawHUI$ @  Next appointment:  Communication:    Blanchie Serve, MD Internal Medicine [place], [place] [address] Cell Phone (Monday-Friday 8 am - 5 pm): [phone] On Call: [phone] and follow prompts after 5 pm and on weekends Office Phone: [phone] Office Fax: [phone]

## 2017-07-04 NOTE — Telephone Encounter (Signed)
Spoke with pt, spoke with [place] and explained the situation. She stated she would call me back to see if they can do this for pt. She lives in independent living so she is not sure if they can do this. I will await return call.

## 2017-07-05 ENCOUNTER — Telehealth: Payer: Self-pay | Admitting: Pulmonary Disease

## 2017-07-05 NOTE — Telephone Encounter (Signed)
Tinika with [facility] is calling back.  States the resident is stating she has already completed her walk test and ordered the CPAP.  CB is [phone]

## 2017-07-05 NOTE — Telephone Encounter (Signed)
Called and spoke with pt explaining why we were having her do the CPAP titration study. Also stated to pt if either her or someone else could bring the SD card from the CPAP machine to our office so we can get a download off of it and immediately give them the card back, that would be great.  Pt expressed understanding with the above stated and said either her or someone else would get the card to [place]. I stated to pt she did not need an appt to bring the card, she could just drop by and we could take care of printing a download that same time. Nothing further needed at this current time.

## 2017-07-12 ENCOUNTER — Telehealth: Payer: Self-pay | Admitting: Pulmonary Disease

## 2017-07-12 NOTE — Telephone Encounter (Signed)
Called and spoke to pt. Pt states she is scheduled for a CPAP titration for 07/16/2017 and is unable to go to this as she does not have transportation. Pt resides at [facility] and their last transportation vehicle leaves at 4pm and there is no family to take pt. Pt thought this sleep study was going to be done at home. Pt last seen on 1.9.2019 by SG, CPAP titration ordered at [facility].   Sarah please advise. Thanks.

## 2017-07-12 NOTE — Telephone Encounter (Signed)
No current download available in [place]- last viewed over 1 year ago.  Per 07/05/17 phone note patient is to have brought SIM card to office for download, which has never been done.    lmtcb X1 for pt to have chip brought in.

## 2017-07-12 NOTE — Telephone Encounter (Signed)
Did we ever get a down Load for her CPAP machine?  That would be the best determination of whether we need the CPAP titration. Thanks

## 2017-07-13 NOTE — Telephone Encounter (Signed)
Patient in lobby with SD DL needed Completed DL, gave copy to patient Nothing further needed at this time

## 2017-07-13 NOTE — Telephone Encounter (Signed)
Spoke with pt, advised pt to bring her sim card from her CPAP so we can retrieve a download. Pt states she would have somebody bring it up here today.

## 2017-07-13 NOTE — Telephone Encounter (Signed)
Patient waiting in lobby with SD card.

## 2017-07-16 ENCOUNTER — Encounter (HOSPITAL_BASED_OUTPATIENT_CLINIC_OR_DEPARTMENT_OTHER): Payer: Self-pay

## 2017-07-16 ENCOUNTER — Other Ambulatory Visit: Payer: Self-pay

## 2017-07-16 ENCOUNTER — Observation Stay (HOSPITAL_COMMUNITY)
Admission: EM | Admit: 2017-07-16 | Discharge: 2017-07-18 | Disposition: A | Discharge status: Home / Self Care | Payer: Medicare Other | Attending: Family Medicine | Admitting: Family Medicine

## 2017-07-16 ENCOUNTER — Emergency Department (HOSPITAL_COMMUNITY): Payer: Medicare Other

## 2017-07-16 ENCOUNTER — Encounter (HOSPITAL_COMMUNITY): Payer: Self-pay | Admitting: Emergency Medicine

## 2017-07-16 DIAGNOSIS — Z87891 Personal history of nicotine dependence: Secondary | ICD-10-CM | POA: Insufficient documentation

## 2017-07-16 DIAGNOSIS — R42 Dizziness and giddiness: Secondary | ICD-10-CM | POA: Diagnosis not present

## 2017-07-16 DIAGNOSIS — E785 Hyperlipidemia, unspecified: Secondary | ICD-10-CM | POA: Diagnosis not present

## 2017-07-16 DIAGNOSIS — R0789 Other chest pain: Principal | ICD-10-CM | POA: Insufficient documentation

## 2017-07-16 DIAGNOSIS — Z9981 Dependence on supplemental oxygen: Secondary | ICD-10-CM | POA: Insufficient documentation

## 2017-07-16 DIAGNOSIS — Z66 Do not resuscitate: Secondary | ICD-10-CM | POA: Insufficient documentation

## 2017-07-16 DIAGNOSIS — N281 Cyst of kidney, acquired: Secondary | ICD-10-CM | POA: Insufficient documentation

## 2017-07-16 DIAGNOSIS — Z881 Allergy status to other antibiotic agents status: Secondary | ICD-10-CM | POA: Insufficient documentation

## 2017-07-16 DIAGNOSIS — F418 Other specified anxiety disorders: Secondary | ICD-10-CM | POA: Diagnosis present

## 2017-07-16 DIAGNOSIS — M419 Scoliosis, unspecified: Secondary | ICD-10-CM | POA: Diagnosis not present

## 2017-07-16 DIAGNOSIS — Z7982 Long term (current) use of aspirin: Secondary | ICD-10-CM | POA: Insufficient documentation

## 2017-07-16 DIAGNOSIS — R8271 Bacteriuria: Secondary | ICD-10-CM | POA: Insufficient documentation

## 2017-07-16 DIAGNOSIS — I890 Lymphedema, not elsewhere classified: Secondary | ICD-10-CM

## 2017-07-16 DIAGNOSIS — E876 Hypokalemia: Secondary | ICD-10-CM | POA: Diagnosis not present

## 2017-07-16 DIAGNOSIS — J9811 Atelectasis: Secondary | ICD-10-CM | POA: Insufficient documentation

## 2017-07-16 DIAGNOSIS — N184 Chronic kidney disease, stage 4 (severe): Secondary | ICD-10-CM | POA: Diagnosis not present

## 2017-07-16 DIAGNOSIS — F329 Major depressive disorder, single episode, unspecified: Secondary | ICD-10-CM | POA: Insufficient documentation

## 2017-07-16 DIAGNOSIS — R079 Chest pain, unspecified: Secondary | ICD-10-CM

## 2017-07-16 DIAGNOSIS — I129 Hypertensive chronic kidney disease with stage 1 through stage 4 chronic kidney disease, or unspecified chronic kidney disease: Secondary | ICD-10-CM | POA: Insufficient documentation

## 2017-07-16 DIAGNOSIS — Z96653 Presence of artificial knee joint, bilateral: Secondary | ICD-10-CM | POA: Insufficient documentation

## 2017-07-16 DIAGNOSIS — K219 Gastro-esophageal reflux disease without esophagitis: Secondary | ICD-10-CM | POA: Diagnosis not present

## 2017-07-16 DIAGNOSIS — I700 Atherosclerosis of aorta: Secondary | ICD-10-CM | POA: Insufficient documentation

## 2017-07-16 DIAGNOSIS — E039 Hypothyroidism, unspecified: Secondary | ICD-10-CM | POA: Diagnosis present

## 2017-07-16 DIAGNOSIS — Z853 Personal history of malignant neoplasm of breast: Secondary | ICD-10-CM | POA: Diagnosis not present

## 2017-07-16 DIAGNOSIS — Z79899 Other long term (current) drug therapy: Secondary | ICD-10-CM | POA: Insufficient documentation

## 2017-07-16 DIAGNOSIS — E890 Postprocedural hypothyroidism: Secondary | ICD-10-CM | POA: Diagnosis not present

## 2017-07-16 DIAGNOSIS — Z6841 Body Mass Index (BMI) 40.0 and over, adult: Secondary | ICD-10-CM | POA: Insufficient documentation

## 2017-07-16 DIAGNOSIS — I10 Essential (primary) hypertension: Secondary | ICD-10-CM | POA: Diagnosis present

## 2017-07-16 DIAGNOSIS — Z885 Allergy status to narcotic agent status: Secondary | ICD-10-CM | POA: Insufficient documentation

## 2017-07-16 DIAGNOSIS — Z888 Allergy status to other drugs, medicaments and biological substances status: Secondary | ICD-10-CM | POA: Insufficient documentation

## 2017-07-16 DIAGNOSIS — Z993 Dependence on wheelchair: Secondary | ICD-10-CM | POA: Insufficient documentation

## 2017-07-16 DIAGNOSIS — R7989 Other specified abnormal findings of blood chemistry: Secondary | ICD-10-CM | POA: Insufficient documentation

## 2017-07-16 DIAGNOSIS — I2510 Atherosclerotic heart disease of native coronary artery without angina pectoris: Secondary | ICD-10-CM | POA: Diagnosis not present

## 2017-07-16 DIAGNOSIS — I712 Thoracic aortic aneurysm, without rupture: Secondary | ICD-10-CM | POA: Insufficient documentation

## 2017-07-16 DIAGNOSIS — R0600 Dyspnea, unspecified: Secondary | ICD-10-CM

## 2017-07-16 DIAGNOSIS — E662 Morbid (severe) obesity with alveolar hypoventilation: Secondary | ICD-10-CM | POA: Diagnosis not present

## 2017-07-16 DIAGNOSIS — R000 Tachycardia, unspecified: Secondary | ICD-10-CM | POA: Diagnosis not present

## 2017-07-16 LAB — CBC WITH DIFFERENTIAL/PLATELET
BASOS ABS: 0 10*3/uL (ref 0.0–0.1)
Basophils Relative: 0 %
Eosinophils Absolute: 0.2 10*3/uL (ref 0.0–0.7)
Eosinophils Relative: 3 %
HEMATOCRIT: 39.3 % (ref 36.0–46.0)
Hemoglobin: 12.8 g/dL (ref 12.0–15.0)
LYMPHS ABS: 0.9 10*3/uL (ref 0.7–4.0)
LYMPHS PCT: 13 %
MCH: 28.8 pg (ref 26.0–34.0)
MCHC: 32.6 g/dL (ref 30.0–36.0)
MCV: 88.5 fL (ref 78.0–100.0)
MONO ABS: 0.5 10*3/uL (ref 0.1–1.0)
Monocytes Relative: 8 %
NEUTROS ABS: 5.3 10*3/uL (ref 1.7–7.7)
Neutrophils Relative %: 76 %
Platelets: 165 10*3/uL (ref 150–400)
RBC: 4.44 MIL/uL (ref 3.87–5.11)
RDW: 15.4 % (ref 11.5–15.5)
WBC: 6.9 10*3/uL (ref 4.0–10.5)

## 2017-07-16 LAB — COMPREHENSIVE METABOLIC PANEL
ALT: 13 U/L — ABNORMAL LOW (ref 14–54)
AST: 20 U/L (ref 15–41)
Albumin: 3.2 g/dL — ABNORMAL LOW (ref 3.5–5.0)
Alkaline Phosphatase: 96 U/L (ref 38–126)
Anion gap: 8 (ref 5–15)
BUN: 17 mg/dL (ref 6–20)
CO2: 31 mmol/L (ref 22–32)
Calcium: 8.2 mg/dL — ABNORMAL LOW (ref 8.9–10.3)
Chloride: 100 mmol/L — ABNORMAL LOW (ref 101–111)
Creatinine, Ser: 1.06 mg/dL — ABNORMAL HIGH (ref 0.44–1.00)
GFR calc Af Amer: 54 mL/min — ABNORMAL LOW (ref >60)
GFR calc non Af Amer: 47 mL/min — ABNORMAL LOW (ref >60)
Glucose, Bld: 125 mg/dL — ABNORMAL HIGH (ref 65–99)
POTASSIUM: 2.8 mmol/L — AB (ref 3.5–5.1)
Sodium: 139 mmol/L (ref 135–145)
TOTAL PROTEIN: 6.8 g/dL (ref 6.5–8.1)
Total Bilirubin: 0.7 mg/dL (ref 0.3–1.2)

## 2017-07-16 LAB — URINALYSIS, ROUTINE W REFLEX MICROSCOPIC
Bilirubin Urine: NEGATIVE
Glucose, UA: NEGATIVE mg/dL
Hgb urine dipstick: NEGATIVE
KETONES UR: NEGATIVE mg/dL
Nitrite: NEGATIVE
PH: 5 (ref 5.0–8.0)
PROTEIN: NEGATIVE mg/dL
Specific Gravity, Urine: 1.009 (ref 1.005–1.030)

## 2017-07-16 LAB — D-DIMER, QUANTITATIVE: D-Dimer, Quant: 1.14 ug/mL-FEU — ABNORMAL HIGH (ref 0.00–0.50)

## 2017-07-16 LAB — TROPONIN I: Troponin I: <0.03 ng/mL (ref <0.03)

## 2017-07-16 LAB — MAGNESIUM: Magnesium: 1.5 mg/dL — ABNORMAL LOW (ref 1.7–2.4)

## 2017-07-16 LAB — I-STAT TROPONIN, ED
TROPONIN I, POC: 0 ng/mL (ref 0.00–0.08)
Troponin i, poc: 0 ng/mL (ref 0.00–0.08)

## 2017-07-16 LAB — LIPASE, BLOOD: Lipase: 22 U/L (ref 11–51)

## 2017-07-16 MED ORDER — PANTOPRAZOLE SODIUM 40 MG PO TBEC
40.0000 mg | DELAYED_RELEASE_TABLET | Freq: Every day | ORAL | Status: DC
  Administered 2017-07-17: 40 mg via ORAL
  Filled 2017-07-16 (×3): qty 1

## 2017-07-16 MED ORDER — POTASSIUM CHLORIDE 10 MEQ/100ML IV SOLN
10.0000 meq | Freq: Once | INTRAVENOUS | Status: AC
  Administered 2017-07-16: 10 meq via INTRAVENOUS
  Filled 2017-07-16: qty 100

## 2017-07-16 MED ORDER — ONDANSETRON HCL 4 MG/2ML IJ SOLN
4.0000 mg | Freq: Four times a day (QID) | INTRAMUSCULAR | Status: DC | PRN
  Administered 2017-07-17: 4 mg via INTRAVENOUS
  Filled 2017-07-16: qty 2

## 2017-07-16 MED ORDER — LOSARTAN POTASSIUM 50 MG PO TABS
100.0000 mg | ORAL_TABLET | Freq: Every day | ORAL | Status: DC
  Administered 2017-07-16 – 2017-07-18 (×3): 100 mg via ORAL
  Filled 2017-07-16 (×4): qty 2

## 2017-07-16 MED ORDER — MAGNESIUM SULFATE 4 GM/100ML IV SOLN
4.0000 g | Freq: Once | INTRAVENOUS | Status: AC
  Administered 2017-07-16: 4 g via INTRAVENOUS
  Filled 2017-07-16: qty 100

## 2017-07-16 MED ORDER — ONDANSETRON HCL 4 MG PO TABS: 4.0000 mg | ORAL_TABLET | Freq: Four times a day (QID) | ORAL | Status: DC | PRN

## 2017-07-16 MED ORDER — LORATADINE 10 MG PO TABS
10.0000 mg | ORAL_TABLET | Freq: Every day | ORAL | Status: DC
  Administered 2017-07-16 – 2017-07-18 (×3): 10 mg via ORAL
  Filled 2017-07-16 (×4): qty 1

## 2017-07-16 MED ORDER — ASPIRIN 81 MG PO CHEW
81.0000 mg | CHEWABLE_TABLET | Freq: Every day | ORAL | Status: DC
  Administered 2017-07-16 – 2017-07-18 (×3): 81 mg via ORAL
  Filled 2017-07-16 (×4): qty 1

## 2017-07-16 MED ORDER — ENOXAPARIN SODIUM 80 MG/0.8ML ~~LOC~~ SOLN
65.0000 mg | SUBCUTANEOUS | Status: DC
  Administered 2017-07-16 – 2017-07-17 (×2): 65 mg via SUBCUTANEOUS
  Filled 2017-07-16 (×2): qty 0.8

## 2017-07-16 MED ORDER — LEVOTHYROXINE SODIUM 25 MCG PO TABS
125.0000 ug | ORAL_TABLET | Freq: Every day | ORAL | Status: DC
  Administered 2017-07-17 – 2017-07-18 (×2): 125 ug via ORAL
  Filled 2017-07-16 (×2): qty 1

## 2017-07-16 MED ORDER — DOCUSATE SODIUM 100 MG PO CAPS: 100.0000 mg | ORAL_CAPSULE | Freq: Two times a day (BID) | ORAL | Status: DC | PRN

## 2017-07-16 MED ORDER — SERTRALINE HCL 50 MG PO TABS
75.0000 mg | ORAL_TABLET | Freq: Every day | ORAL | Status: DC
  Administered 2017-07-16 – 2017-07-18 (×3): 75 mg via ORAL
  Filled 2017-07-16 (×7): qty 1

## 2017-07-16 MED ORDER — ACETAMINOPHEN 325 MG PO TABS
650.0000 mg | ORAL_TABLET | Freq: Three times a day (TID) | ORAL | Status: DC | PRN
Start: 2017-07-16 — End: 2017-07-18

## 2017-07-16 MED ORDER — POTASSIUM CHLORIDE CRYS ER 20 MEQ PO TBCR
40.0000 meq | EXTENDED_RELEASE_TABLET | Freq: Once | ORAL | Status: AC
  Administered 2017-07-16: 40 meq via ORAL
  Filled 2017-07-16: qty 2

## 2017-07-16 MED ORDER — SODIUM CHLORIDE 0.9 % IV SOLN
INTRAVENOUS | Status: DC
  Administered 2017-07-16: 22:00:00 via INTRAVENOUS

## 2017-07-16 MED ORDER — FUROSEMIDE 40 MG PO TABS
40.0000 mg | ORAL_TABLET | Freq: Every day | ORAL | Status: DC
  Administered 2017-07-17 – 2017-07-18 (×2): 40 mg via ORAL
  Filled 2017-07-16 (×3): qty 1

## 2017-07-16 MED ORDER — POTASSIUM CHLORIDE 20 MEQ/15ML (10%) PO SOLN
40.0000 meq | ORAL | Status: AC
  Administered 2017-07-16 – 2017-07-17 (×3): 40 meq via ORAL
  Filled 2017-07-16 (×6): qty 30

## 2017-07-16 MED ORDER — IOPAMIDOL (ISOVUE-370) INJECTION 76%
INTRAVENOUS | Status: AC
  Administered 2017-07-16: 100 mL
  Filled 2017-07-16: qty 100

## 2017-07-16 NOTE — ED Provider Notes (Signed)
[facility] DEPT Provider Note   CSN: [id number] Arrival date & time: 07/16/17  1035     History   Chief Complaint Chief Complaint  Patient presents with  . Chest Pain  . Abdominal Pain    HPI Tamara Morales is a 84 y.o. female.  HPI   Presents with concern for chest pain and abdominal pain after breakfast this morning Felt like a pressure on the chest Before she got in the hall to go to her room at [facility] was having more pressure and burning in her chest, went to nursing station and they began to do testing and call ambulance When ambulance arrived, did not have pressure but felt exhausted and still feels exhausted right now. Located in middle of chest without radiation Was sudden and intense, not sure if other symptoms with it. Had omeprazole prior to breakfast.  Felt nausea. No vomiting. Doesn't think she was short of breath.  Had some dizziness. No abdominal pain.  Lasted about 30 minutes No hx of chest pain like this in the past, has hx of GERD but this felt different   Past Medical History:  Diagnosis Date  . ARTHRITIS, KNEES, BILATERAL   . Breast CA ([place]) 2000   s/p R lumpectomy and XRT  . DEPRESSION   . DJD (degenerative joint disease) of knee   . GERD   . HYPERLIPIDEMIA   . HYPERTENSION   . HYPOTHYROIDISM    postsurgical  . Hypoxia   . MIGRAINE HEADACHE   . Morbid obesity ([place])   . OSA (obstructive sleep apnea) 01/17/2011 dx  . Oxygen dependent   . Urge incontinence   . UTI (lower urinary tract infection) 12/2014    Patient Active Problem List   Diagnosis Date Noted  . Prediabetes 07/03/2017  . Hyperlipidemia LDL goal <100 07/03/2017  . Post-nasal drip 06/27/2017  . CKD (chronic kidney disease) stage 3, GFR 30-59 ml/min (HCC) 05/22/2017  . Bilateral dry eyes 04/20/2017  . Hemorrhoids 04/09/2017  . Chronic acquired lymphedema 03/30/2017  . Chronic constipation 03/30/2017  . Weight gain 12/07/2016  . Neck  pain on left side 10/05/2016  . Chronic left shoulder pain 08/10/2016  . Hematuria 08/10/2016  . Fat necrosis of breast 05/17/2016  . Numbness and tingling of right leg 04/06/2016  . Contusion of right breast 04/06/2016  . IBS (irritable bowel syndrome) 11/18/2015  . Hypokalemia 07/07/2015  . Acid reflux 07/05/2015  . Lymphedema of both lower extremities 07/02/2015  . Ureterolithiasis   . Ureteral stone with hydronephrosis 06/28/2015  . Right ureteral calculus 06/27/2015  . Depression with anxiety 06/27/2015  . History of breast cancer 06/27/2015  . Abdominal pain, RLQ (right lower quadrant) 06/27/2015  . Obesity hypoventilation syndrome ([place]) 11/27/2014  . Orthopnea 04/09/2013  . Other chest pain 11/30/2012  . Cough 03/05/2010  . Morbid obesity ([place]) 10/04/2009  . Hypothyroidism 08/05/2009  . Essential hypertension 08/05/2009    Past Surgical History:  Procedure Laterality Date  . ABDOMINAL HYSTERECTOMY    . ADENOIDECTOMY    . BREAST BIOPSY  2000  . BREAST LUMPECTOMY  2000  . CATARACT EXTRACTION    . CHOLECYSTECTOMY  1995  . CYSTOSCOPY/URETEROSCOPY/HOLMIUM LASER Right 06/28/2015   Procedure: CYSTOSCOPY RIGHT RETROGRAD RIGHT URETEROSCOPY/HOLMIUM LASER WITH RIGHT STENT PLACEMENT;  Surgeon: Alexis Frock, MD;  Location: [facility];  Service: Urology;  Laterality: Right;  . PARATHYROIDECTOMY     2-3 removed  . REPLACEMENT TOTAL KNEE BILATERAL  1999, 2005  .  Right leg femur repaired    . THYROIDECTOMY    . TONSILLECTOMY    . TUBAL LIGATION      OB History    Gravida Para Term Preterm AB Living   5 5           SAB TAB Ectopic Multiple Live Births                   Home Medications    Prior to Admission medications   Medication Sig Start Date End Date Taking? Authorizing Provider  acetaminophen (TYLENOL) 325 MG tablet Take 650 mg by mouth every 8 (eight) hours as needed for mild pain or moderate pain.    Yes [provider]  aspirin 81 MG tablet Take 81 mg by  mouth daily.    Yes [provider]  docusate sodium (COLACE) 100 MG capsule Take 1 capsule (100 mg total) by mouth 2 (two) times daily as needed for mild constipation or moderate constipation. 01/09/17  Yes Blanchie Serve, MD  furosemide (LASIX) 40 MG tablet Take 1 tablet (40 mg total) by mouth 2 (two) times daily. Patient taking differently: Take 40 mg by mouth daily.  03/27/17  Yes Pandey, Mahima, MD  hydrochlorothiazide (HYDRODIURIL) 25 MG tablet TAKE 1 TABLET DAILY. Patient taking differently: TAKE 12.5mg  BY MOUTH DAILY. 03/23/17  Yes Blanchie Serve, MD  levothyroxine (SYNTHROID, LEVOTHROID) 125 MCG tablet Take 1 tablet (125 mcg total) by mouth daily. Patient taking differently: Take 150 mcg by mouth daily.  07/03/17  Yes Blanchie Serve, MD  loratadine (CLARITIN) 10 MG tablet Take 1 tablet (10 mg total) by mouth daily. 07/03/17  Yes Blanchie Serve, MD  losartan (COZAAR) 100 MG tablet Take 1 tablet (100 mg total) by mouth daily. Hold for SBP less than 110. 02/14/17  Yes Pandey, Mahima, MD  metolazone (ZAROXOLYN) 2.5 MG tablet Take 1 tablet (2.5 mg total) by mouth daily. 03/27/17  Yes Blanchie Serve, MD  omeprazole (PRILOSEC) 20 MG capsule TAKE (1) CAPSULE DAILY. 03/02/17  Yes Mast, Man X, NP  ondansetron (ZOFRAN) 4 MG tablet Take 1 tablet (4 mg total) by mouth every 6 (six) hours as needed for nausea. 2/77/82  Yes Delora Fuel, MD  potassium chloride (KLOR-CON) 20 MEQ packet Take 60 mEq by mouth every morning.   Yes [provider]  potassium chloride (KLOR-CON) 20 MEQ packet Take 40 mEq by mouth at bedtime.   Yes [provider]  sertraline (ZOLOFT) 50 MG tablet Take one and a half tablet daily for anxiety 03/27/17  Yes Blanchie Serve, MD  nystatin cream (MYCOSTATIN) Apply 1 application topically 2 (two) times daily. To right posterior knee skin fold area with redness. Patient not taking: Reported on 07/16/2017 05/22/17   Blanchie Serve, MD    Family History Family History    Problem Relation Age of Onset  . Emphysema Sister   . Coronary artery disease Neg Hx     Social History Social History   Tobacco Use  . Smoking status: Former Smoker    Packs/day: 0.25    Years: 2.00    Pack years: 0.50    Types: Cigarettes    Last attempt to quit: 06/19/1974    Years since quitting: 43.1  . Smokeless tobacco: Never Used  . Tobacco comment: Quit in late 20's  Substance Use Topics  . Alcohol use: No    Alcohol/week: 0.0 oz    Comment: rare  . Drug use: No     Allergies  Azithromycin; Beta adrenergic blockers; Ciprofloxacin; Codeine; Epinephrine; Klonopin [clonazepam]; Oxycodone; Paxil [paroxetine hydrochloride]; Prednisone; Propoxyphene hcl; Torsemide; Tramadol; Vicodin [hydrocodone-acetaminophen]; Meloxicam; and Vancomycin   Review of Systems Review of Systems  Constitutional: Positive for fatigue. Negative for diaphoresis and fever.  HENT: Negative for sore throat.   Eyes: Negative for visual disturbance.  Respiratory: Negative for cough and shortness of breath.   Cardiovascular: Positive for chest pain. Negative for leg swelling (chronic lymphadema, unchanged).  Gastrointestinal: Positive for nausea. Negative for abdominal pain, diarrhea and vomiting.  Genitourinary: Negative for difficulty urinating.  Musculoskeletal: Negative for back pain and neck pain.  Skin: Negative for rash.  Neurological: Positive for light-headedness. Negative for syncope and headaches.     Physical Exam Updated Vital Signs BP (!) 143/73 (BP Location: Right Arm)   Pulse 82   Temp 97.7 F (36.5 C) (Oral)   Resp 15   SpO2 96%   Physical Exam  Constitutional: She is oriented to person, place, and time. She appears well-developed and well-nourished. No distress.  HENT:  Head: Normocephalic and atraumatic.  Eyes: Conjunctivae and EOM are normal.  Neck: Normal range of motion.  Cardiovascular: Normal rate, regular rhythm, normal heart sounds and intact distal pulses.  Exam reveals no gallop and no friction rub.  No murmur heard. Pulmonary/Chest: Effort normal and breath sounds normal. No respiratory distress. She has no wheezes. She has no rales.  Abdominal: Soft. She exhibits no distension. There is no tenderness. There is no guarding.  Musculoskeletal: She exhibits no edema or tenderness.  Neurological: She is alert and oriented to person, place, and time.  Skin: Skin is warm and dry. No rash noted. She is not diaphoretic. No erythema.  Nursing note and vitals reviewed.    ED Treatments / Results  Labs (all labs ordered are listed, but only abnormal results are displayed) Labs Reviewed  COMPREHENSIVE METABOLIC PANEL - Abnormal; Notable for the following components:      Result Value   Potassium 2.8 (*)    Chloride 100 (*)    Glucose, Bld 125 (*)    Creatinine, Ser 1.06 (*)    Calcium 8.2 (*)    Albumin 3.2 (*)    ALT 13 (*)    GFR calc non Af Amer 47 (*)    GFR calc Af Amer 54 (*)    All other components within normal limits  URINALYSIS, ROUTINE W REFLEX MICROSCOPIC - Abnormal; Notable for the following components:   Leukocytes, UA SMALL (*)    Bacteria, UA FEW (*)    Squamous Epithelial / LPF 0-5 (*)    All other components within normal limits  D-DIMER, QUANTITATIVE (NOT AT [facility]) - Abnormal; Notable for the following components:   D-Dimer, Quant 1.14 (*)    All other components within normal limits  URINE CULTURE  CBC WITH DIFFERENTIAL/PLATELET  LIPASE, BLOOD  MAGNESIUM  I-STAT TROPONIN, ED  I-STAT TROPONIN, ED  I-STAT TROPONIN, ED    EKG  EKG Interpretation  Date/Time:  Monday July 16 2017 10:56:31 EST Ventricular Rate:  86 PR Interval:    QRS Duration: 100 QT Interval:  372 QTC Calculation: 445 R Axis:   -2 Text Interpretation:  Sinus rhythm Ventricular premature complex Since prior ECG, PVC is new, no other significant change from prior Confirmed by Gareth Morgan [phone]) on 07/16/2017 11:07:08 AM        Radiology No results found.  Procedures Procedures (including critical care time)  Medications Ordered in ED Medications  potassium chloride  10 mEq in 100 mL IVPB (0 mEq Intravenous Stopped 07/16/17 1505)  potassium chloride SA (K-DUR,KLOR-CON) CR tablet 40 mEq (40 mEq Oral Given 07/16/17 1438)     Initial Impression / Assessment and Plan / ED Course  I have reviewed the triage vital signs and the nursing notes.  Pertinent labs & imaging results that were available during my care of the patient were reviewed by me and considered in my medical decision making (see chart for details).     84yo female with history of hypertension, hyperlipidemia, prediabetes, morbid obesity, hypothyroidism, presents with concern for chest pain.   Differential diagnosis for chest pain includes pulmonary embolus, dissection, pneumothorax, pneumonia, ACS, myocarditis, pericarditis.  EKG was done and evaluate by me and showed no acute ST changes and no signs of pericarditis. Chest x-ray was done and evaluated by me and radiology and showed no sign of pneumonia or pneumothorax.  Initially low concern for PE given no dyspnea.  Chest pain resolved, may be GERD versus CAD vs other.    While patient in the ED waiting for second troponin, developed shortness of breath.  Added ddimer, which returned positive. CT PE study ordered and pending.  Given patient high risk HEAR score with continuing symptoms of fatigue, dyspnea, in ED, prior chest pressure, will admit for continued chest pain observation. CT PE study pending.    Of note, pt with K 2.8. Ordered oral K and potassium infusion. Infusion unfortunately infiltrated. Discussed symptoms to look for, provided warm compresses, rec 4 times daily. No sign of complications at this time.   Final Clinical Impressions(s) / ED Diagnoses   Final diagnoses:  Chest pain, unspecified type  Dyspnea, unspecified type    ED Discharge Orders    None       Gareth Morgan, MD 07/16/17 1727

## 2017-07-16 NOTE — ED Triage Notes (Signed)
Pt c/o one episode of epigastric pain at 0930 this morning after eating breakfast. Pain resolved on its own. Pt given one asa by EMS. Pt denies CP or SOB on arrival.

## 2017-07-16 NOTE — Progress Notes (Signed)
Pt gives permission to ask questions on nursing admission history while her daughter is in the room. Lucius Conn BSN, RN-BC Admissions RN 07/16/2017 7:12 PM

## 2017-07-16 NOTE — ED Notes (Signed)
Bed: LY65 Expected date:  Expected time:  Means of arrival:  Comments: EMS 88 F, acid reflux

## 2017-07-16 NOTE — ED Notes (Signed)
I-Stat troponin was collected at 1117, but the machine did not cross over the result for some reason. Attempting to rectify this now

## 2017-07-16 NOTE — Progress Notes (Signed)
PHARMACY - ENOXAPARIN  Pharmacy asked to adjust enoxaparin dose as needed for VTE prophylaxis.  Height: 63 inches Weight: 131.5 kg BMI: 51 CrCl: 52 ml/min  Asssesment:  As BMI > 30 and CrCl > 30 ml/min, will adjust enoxaparin to 0.5 mg/kg/q24h per manufacturer recommendations  Plan:   Change enoxaparin to 65 mg sq q24h for VTE prophylaxis  Leone Haven, PharmD

## 2017-07-16 NOTE — Progress Notes (Signed)
Patient stated that she is on CPAP and oxygen @ night.  PCP was notified.

## 2017-07-16 NOTE — H&P (Signed)
[facility] H&P    Patient Demographics:    Tamara Morales, is a 84 y.o. female  MRN: [id number]  DOB - 1930/07/09  Admit Date - 07/16/2017  Referring MD/NP/PA: Dr Billy Fischer  Outpatient Primary MD for the patient is Blanchie Serve, MD  Patient coming from: friend's home  CC-chest pain   HPI:    Tamara Morales  is a 84 y.o. female, with history of hypertension, CKD stage IV, chronic lymphedema, hypothyroidism, depression came to hospital with chief complaint of chest pain, which started after patient ate breakfast this morning. She felt like a balloon in the middle of her chest, lasted for about an hour. She denies nausea and vomiting. Patient became short of breath after she received IV potassium in the ED. D dimer was positive and CTA chest has been ordered to rule out pulmonary embolism. She does not have a history of emphysema COPD denies history of stroke or seizures. No fever or chills. In the ED, she was found to have low magnesium and potassium. IV potassium infiltrated so could not be given. Also patient had flushed feeling with chest pain and shortness of breath so CTA chest has been ordered.      Review of systems:      All other systems reviewed and are negative.   With Past History of the following :    Past Medical History:  Diagnosis Date  . ARTHRITIS, KNEES, BILATERAL   . Breast CA ([place]) 2000   s/p R lumpectomy and XRT  . DEPRESSION   . DJD (degenerative joint disease) of knee   . GERD   . HYPERLIPIDEMIA   . HYPERTENSION   . HYPOTHYROIDISM    postsurgical  . Hypoxia   . MIGRAINE HEADACHE   . Morbid obesity ([place])   . OSA (obstructive sleep apnea) 01/17/2011 dx  . Oxygen dependent   . Urge incontinence   . UTI (lower urinary tract infection) 12/2014      Past Surgical History:  Procedure Laterality Date  . ABDOMINAL HYSTERECTOMY    . ADENOIDECTOMY    . BREAST BIOPSY   2000  . BREAST LUMPECTOMY  2000  . CATARACT EXTRACTION    . CHOLECYSTECTOMY  1995  . CYSTOSCOPY/URETEROSCOPY/HOLMIUM LASER Right 06/28/2015   Procedure: CYSTOSCOPY RIGHT RETROGRAD RIGHT URETEROSCOPY/HOLMIUM LASER WITH RIGHT STENT PLACEMENT;  Surgeon: Alexis Frock, MD;  Location: [facility];  Service: Urology;  Laterality: Right;  . PARATHYROIDECTOMY     2-3 removed  . REPLACEMENT TOTAL KNEE BILATERAL  1999, 2005  . Right leg femur repaired    . THYROIDECTOMY    . TONSILLECTOMY    . TUBAL LIGATION        Social History:      Social History   Tobacco Use  . Smoking status: Former Smoker    Packs/day: 0.25    Years: 2.00    Pack years: 0.50    Types: Cigarettes    Last attempt to quit: 06/19/1974    Years since quitting: 43.1  . Smokeless tobacco: Never Used  . Tobacco  comment: Quit in late 20's  Substance Use Topics  . Alcohol use: No    Alcohol/week: 0.0 oz    Comment: rare       Family History :     Family History  Problem Relation Age of Onset  . Emphysema Sister   . Coronary artery disease Neg Hx       Home Medications:   Prior to Admission medications   Medication Sig Start Date End Date Taking? Authorizing Provider  acetaminophen (TYLENOL) 325 MG tablet Take 650 mg by mouth every 8 (eight) hours as needed for mild pain or moderate pain.    Yes [provider]  aspirin 81 MG tablet Take 81 mg by mouth daily.    Yes [provider]  docusate sodium (COLACE) 100 MG capsule Take 1 capsule (100 mg total) by mouth 2 (two) times daily as needed for mild constipation or moderate constipation. 01/09/17  Yes Blanchie Serve, MD  furosemide (LASIX) 40 MG tablet Take 1 tablet (40 mg total) by mouth 2 (two) times daily. Patient taking differently: Take 40 mg by mouth daily.  03/27/17  Yes Pandey, Mahima, MD  hydrochlorothiazide (HYDRODIURIL) 25 MG tablet TAKE 1 TABLET DAILY. Patient taking differently: TAKE 12.5mg  BY MOUTH DAILY. 03/23/17  Yes Blanchie Serve,  MD  levothyroxine (SYNTHROID, LEVOTHROID) 125 MCG tablet Take 1 tablet (125 mcg total) by mouth daily. Patient taking differently: Take 150 mcg by mouth daily.  07/03/17  Yes Blanchie Serve, MD  loratadine (CLARITIN) 10 MG tablet Take 1 tablet (10 mg total) by mouth daily. 07/03/17  Yes Blanchie Serve, MD  losartan (COZAAR) 100 MG tablet Take 1 tablet (100 mg total) by mouth daily. Hold for SBP less than 110. 02/14/17  Yes Pandey, Mahima, MD  metolazone (ZAROXOLYN) 2.5 MG tablet Take 1 tablet (2.5 mg total) by mouth daily. 03/27/17  Yes Blanchie Serve, MD  omeprazole (PRILOSEC) 20 MG capsule TAKE (1) CAPSULE DAILY. 03/02/17  Yes Mast, Man X, NP  ondansetron (ZOFRAN) 4 MG tablet Take 1 tablet (4 mg total) by mouth every 6 (six) hours as needed for nausea. 10/26/30  Yes Delora Fuel, MD  potassium chloride (KLOR-CON) 20 MEQ packet Take 60 mEq by mouth every morning.   Yes [provider]  potassium chloride (KLOR-CON) 20 MEQ packet Take 40 mEq by mouth at bedtime.   Yes [provider]  sertraline (ZOLOFT) 50 MG tablet Take one and a half tablet daily for anxiety 03/27/17  Yes Blanchie Serve, MD  nystatin cream (MYCOSTATIN) Apply 1 application topically 2 (two) times daily. To right posterior knee skin fold area with redness. Patient not taking: Reported on 07/16/2017 05/22/17   Blanchie Serve, MD     Allergies:     Allergies  Allergen Reactions  . Azithromycin Itching  . Beta Adrenergic Blockers     Depression   . Ciprofloxacin Other (See Comments)    REACTION: edgy and very jumpy   . Codeine Nausea And Vomiting  . Epinephrine Other (See Comments)    REACTION: rapid pulse, sweats  . Klonopin [Clonazepam] Other (See Comments)    Makes her feel very suicidal   . Oxycodone Other (See Comments)    Patient felt like it altered her mental status "Crazy" . Hallucinations later as well.  [name] Paxil [Paroxetine Hydrochloride] Other (See Comments)    Sever depression   . Prednisone      "makes me feel really bad"  . Propoxyphene Hcl Nausea And Vomiting  .  Torsemide   . Tramadol Other (See Comments)    Feels "jittery"  . Vicodin [Hydrocodone-Acetaminophen] Other (See Comments)    Hallucinations  . Meloxicam Diarrhea  . Vancomycin Rash    Localized rash related to infusion rate.     Physical Exam:   Vitals  Blood pressure (!) 143/73, pulse 82, temperature 97.7 F (36.5 C), temperature source Oral, resp. rate 15, SpO2 96 %.  1.  General: appears in no acute distress  2. Psychiatric:  Intact judgement and  insight, awake alert, oriented x 3.  3. Neurologic: No focal neurological deficits, all cranial nerves intact.Strength 5/5 all 4 extremities, sensation intact all 4 extremities, plantars down going.  4. Eyes :  anicteric sclerae, moist conjunctivae with no lid lag. PERRLA.  5. ENMT:  Oropharynx clear with moist mucous membranes and good dentition  6. Neck:  supple, no cervical lymphadenopathy appriciated, No thyromegaly  7. Respiratory : Normal respiratory effort, good air movement bilaterally,clear to  auscultation bilaterally  8. Cardiovascular : RRR, no gallops, rubs or murmurs, trace leg edema bilaterally  9. Gastrointestinal:  Positive bowel sounds, abdomen soft, non-tender to palpation,no hepatosplenomegaly, no rigidity or guarding       10. Skin:  No cyanosis, normal texture and turgor, no rash, lesions or ulcers  11.Musculoskeletal:  Good muscle tone,  joints appear normal , no effusions,  normal range of motion    Data Review:    CBC Recent Labs  Lab 07/16/17 1109  WBC 6.9  HGB 12.8  HCT 39.3  PLT 165  MCV 88.5  MCH 28.8  MCHC 32.6  RDW 15.4  LYMPHSABS 0.9  MONOABS 0.5  EOSABS 0.2  BASOSABS 0.0   ------------------------------------------------------------------------------------------------------------------  Chemistries  Recent Labs  Lab 07/16/17 1109  NA 139  K 2.8*  CL 100*  CO2 31  GLUCOSE 125*  BUN 17    CREATININE 1.06*  CALCIUM 8.2*  MG 1.5*  AST 20  ALT 13*  ALKPHOS 96  BILITOT 0.7   ------------------------------------------------------------------------------------------------------------------  ------------------------------------------------------------------------------------------------------------------ GFR: Estimated Creatinine Clearance: 50.8 mL/min (A) (by C-G formula based on SCr of 1.06 mg/dL (H)). Liver Function Tests: Recent Labs  Lab 07/16/17 1109  AST 20  ALT 13*  ALKPHOS 96  BILITOT 0.7  PROT 6.8  ALBUMIN 3.2*   Recent Labs  Lab 07/16/17 1109  LIPASE 22    --------------------------------------------------------------------------------------------------------------- Urine analysis:    Component Value Date/Time   COLORURINE YELLOW 07/16/2017 1251   APPEARANCEUR CLEAR 07/16/2017 1251   LABSPEC 1.009 07/16/2017 1251   PHURINE 5.0 07/16/2017 1251   GLUCOSEU NEGATIVE 07/16/2017 1251   GLUCOSEU NEGATIVE 04/17/2012 1232   HGBUR NEGATIVE 07/16/2017 1251   BILIRUBINUR NEGATIVE 07/16/2017 1251   BILIRUBINUR neg 12/22/2016 1124   KETONESUR NEGATIVE 07/16/2017 1251   PROTEINUR NEGATIVE 07/16/2017 1251   UROBILINOGEN negative (A) 12/22/2016 1124   UROBILINOGEN 0.2 01/07/2015 1751   NITRITE NEGATIVE 07/16/2017 1251   LEUKOCYTESUR SMALL (A) 07/16/2017 1251      Imaging Results:    No results found.  My personal review of EKG: Rhythm NSR, no acute ischemic changes.   Assessment & Plan:    Active Problems:   Chest pain   1. Chest pain-please under observation, obtain serial cardiac enzymes. CTA chest has been ordered. Will follow the results to rule out PE 2. Elevated D dimer-D dimer  is elevated at 1.14, CTA chest ordered as above. 3. Hypokalemia-patient is on multiple diuretics including Lasix, zaroxly and HCTZ for chronic lymphedema. Will  discontinue metolazone, HCTZ at this time. Continue Lasix 40 mg PO daily from tomorrow morning. Magnesium  is low at 1.5, will replace magnesium with magnesium sulfate 4 gm IV x1. Will not give IV potassium added infiltrated and cause patient burning in the arm. Start K durr 40 meq PO Q4 hours times four doses 4. Hypomagnesemia- magnesium and check mag level in a.m. 5. chronic lymphedema-stable. 6. Hypothyroidism-continue Synthroid 7. Depression-continue Zoloft   DVT Prophylaxis-   Lovenox   AM Labs Ordered, also please review Full Orders  Family Communication: Admission, patients condition and plan of care including tests being ordered have been discussed with the patient and her daughter at bedside who indicate understanding and agree with the plan and Code Status.  Code Status: DNR  Admission status: observation  Time spent in minutes : 36min   Oswald Hillock M.D on 07/16/2017 at 6:19 PM  Between 7am to 7pm - Pager - [phone]. After 7pm go to [email or web address] - password [facility]  [facility] - Office  [phone]

## 2017-07-16 NOTE — ED Notes (Signed)
MD gave verbal order for RN to get a repeat troponin level around 15:30 for Re-check

## 2017-07-16 NOTE — ED Notes (Signed)
ED TO INPATIENT HANDOFF REPORT  Name/Age/Gender Tamara Morales 84 y.o. female  Code Status Code Status History    Date Active Date Inactive Code Status Order ID Comments User Context   06/27/2015 18:59 07/02/2015 16:01 Full Code [id number]  Theodis Blaze, MD Inpatient   01/07/2015 20:52 01/10/2015 19:22 Full Code [id number]  Lavina Hamman, MD ED   09/05/2013 23:14 09/06/2013 14:51 Full Code [id number]  Mirna Mires, MD ED   08/21/2013 14:04 08/23/2013 14:29 Full Code [id number]  Theodis Blaze, MD Inpatient   11/29/2012 23:46 12/01/2012 19:10 Full Code [id number]  Theressa Millard, MD ED      Home/SNF/Other Home  Chief Complaint chest pain  Level of Care/Admitting Diagnosis ED Disposition    ED Disposition Condition [place] [facility] Area: [facility] [[id number]]  Level of Care: Telemetry [5]  Admit to tele based on following criteria: Complex arrhythmia (Bradycardia/Tachycardia)  Diagnosis: Chest pain [938182]  Admitting Physician: Oswald Hillock Fajardo  Attending Physician: Oswald Hillock [[id number]]  PT Class (Do Not Modify): Observation [104]  PT Acc Code (Do Not Modify): Observation [10022]       Medical History Past Medical History:  Diagnosis Date  . ARTHRITIS, KNEES, BILATERAL   . Breast CA ([place]) 2000   s/p R lumpectomy and XRT  . DEPRESSION   . DJD (degenerative joint disease) of knee   . GERD   . HYPERLIPIDEMIA   . HYPERTENSION   . HYPOTHYROIDISM    postsurgical  . Hypoxia   . MIGRAINE HEADACHE   . Morbid obesity ([place])   . OSA (obstructive sleep apnea) 01/17/2011 dx  . Oxygen dependent   . Urge incontinence   . UTI (lower urinary tract infection) 12/2014    Allergies Allergies  Allergen Reactions  . Azithromycin Itching  . Beta Adrenergic Blockers     Depression   . Ciprofloxacin Other (See Comments)    REACTION: edgy and very jumpy   . Codeine Nausea And Vomiting  . Epinephrine Other (See Comments)    REACTION: rapid pulse,  sweats  . Klonopin [Clonazepam] Other (See Comments)    Makes her feel very suicidal   . Oxycodone Other (See Comments)    Patient felt like it altered her mental status "Crazy" . Hallucinations later as well.  [name] Paxil [Paroxetine Hydrochloride] Other (See Comments)    Sever depression   . Prednisone     "makes me feel really bad"  . Propoxyphene Hcl Nausea And Vomiting  . Torsemide   . Tramadol Other (See Comments)    Feels "jittery"  . Vicodin [Hydrocodone-Acetaminophen] Other (See Comments)    Hallucinations  . Meloxicam Diarrhea  . Vancomycin Rash    Localized rash related to infusion rate.    IV Location/Drains/Wounds Patient Lines/Drains/Airways Status   Active Line/Drains/Airways    Name:   Placement date:   Placement time:   Site:   Days:   Ureteral Drain/Stent Right ureter 5 Fr.   06/28/15    2117    Right ureter   993          Labs/Imaging Results for orders placed or performed during the hospital encounter of 07/16/17 (from the past 48 hour(s))  CBC with Differential     Status: None   Collection Time: 07/16/17 11:09 AM  Result Value Ref Range   WBC 6.9 4.0 - 10.5 K/uL   RBC 4.44 3.87 - 5.11 MIL/uL   Hemoglobin 12.8 12.0 -  15.0 g/dL   HCT 39.3 36.0 - 46.0 %   MCV 88.5 78.0 - 100.0 fL   MCH 28.8 26.0 - 34.0 pg   MCHC 32.6 30.0 - 36.0 g/dL   RDW 15.4 11.5 - 15.5 %   Platelets 165 150 - 400 K/uL   Neutrophils Relative % 76 %   Neutro Abs 5.3 1.7 - 7.7 K/uL   Lymphocytes Relative 13 %   Lymphs Abs 0.9 0.7 - 4.0 K/uL   Monocytes Relative 8 %   Monocytes Absolute 0.5 0.1 - 1.0 K/uL   Eosinophils Relative 3 %   Eosinophils Absolute 0.2 0.0 - 0.7 K/uL   Basophils Relative 0 %   Basophils Absolute 0.0 0.0 - 0.1 K/uL  Comprehensive metabolic panel     Status: Abnormal   Collection Time: 07/16/17 11:09 AM  Result Value Ref Range   Sodium 139 135 - 145 mmol/L   Potassium 2.8 (L) 3.5 - 5.1 mmol/L   Chloride 100 (L) 101 - 111 mmol/L   CO2 31 22 - 32 mmol/L    Glucose, Bld 125 (H) 65 - 99 mg/dL   BUN 17 6 - 20 mg/dL   Creatinine, Ser 1.06 (H) 0.44 - 1.00 mg/dL   Calcium 8.2 (L) 8.9 - 10.3 mg/dL   Total Protein 6.8 6.5 - 8.1 g/dL   Albumin 3.2 (L) 3.5 - 5.0 g/dL   AST 20 15 - 41 U/L   ALT 13 (L) 14 - 54 U/L   Alkaline Phosphatase 96 38 - 126 U/L   Total Bilirubin 0.7 0.3 - 1.2 mg/dL   GFR calc non Af Amer 47 (L) >60 mL/min   GFR calc Af Amer 54 (L) >60 mL/min    Comment: (NOTE) The eGFR has been calculated using the CKD EPI equation. This calculation has not been validated in all clinical situations. eGFR's persistently <60 mL/min signify possible Chronic Kidney Disease.    Anion gap 8 5 - 15  Lipase, blood     Status: None   Collection Time: 07/16/17 11:09 AM  Result Value Ref Range   Lipase 22 11 - 51 U/L  Magnesium     Status: Abnormal   Collection Time: 07/16/17 11:09 AM  Result Value Ref Range   Magnesium 1.5 (L) 1.7 - 2.4 mg/dL  I-Stat Troponin, ED - 0, 3, 6 hours (not at [facility])     Status: None   Collection Time: 07/16/17 11:20 AM  Result Value Ref Range   Troponin i, poc 0.00 0.00 - 0.08 ng/mL   Comment 3            Comment: Due to the release kinetics of cTnI, a negative result within the first hours of the onset of symptoms does not rule out myocardial infarction with certainty. If myocardial infarction is still suspected, repeat the test at appropriate intervals.   Urinalysis, Routine w reflex microscopic     Status: Abnormal   Collection Time: 07/16/17 12:51 PM  Result Value Ref Range   Color, Urine YELLOW YELLOW   APPearance CLEAR CLEAR   Specific Gravity, Urine 1.009 1.005 - 1.030   pH 5.0 5.0 - 8.0   Glucose, UA NEGATIVE NEGATIVE mg/dL   Hgb urine dipstick NEGATIVE NEGATIVE   Bilirubin Urine NEGATIVE NEGATIVE   Ketones, ur NEGATIVE NEGATIVE mg/dL   Protein, ur NEGATIVE NEGATIVE mg/dL   Nitrite NEGATIVE NEGATIVE   Leukocytes, UA SMALL (A) NEGATIVE   RBC / HPF 0-5 0 - 5 RBC/hpf  WBC, UA 6-30 0 - 5 WBC/hpf    Bacteria, UA FEW (A) NONE SEEN   Squamous Epithelial / LPF 0-5 (A) NONE SEEN  I-Stat Troponin, ED (not at [facility])     Status: None   Collection Time: 07/16/17  3:49 PM  Result Value Ref Range   Troponin i, poc 0.00 0.00 - 0.08 ng/mL   Comment 3            Comment: Due to the release kinetics of cTnI, a negative result within the first hours of the onset of symptoms does not rule out myocardial infarction with certainty. If myocardial infarction is still suspected, repeat the test at appropriate intervals.   D-dimer, quantitative (not at [facility])     Status: Abnormal   Collection Time: 07/16/17  4:05 PM  Result Value Ref Range   D-Dimer, Quant 1.14 (H) 0.00 - 0.50 ug/mL-FEU    Comment: (NOTE) At the manufacturer cut-off of 0.50 ug/mL FEU, this assay has been documented to exclude PE with a sensitivity and negative predictive value of 97 to 99%.  At this time, this assay has not been approved by the FDA to exclude DVT/VTE. Results should be correlated with clinical presentation.    No results found.  Pending Labs Unresulted Labs (From admission, onward)   Start     Ordered   07/16/17 1239  Urine culture  STAT,   STAT     07/16/17 1238   Signed and Held  Troponin I (q 6hr x 3)  Now then every 6 hours,   R     Signed and Held   Signed and Held  CBC  (enoxaparin (LOVENOX)    CrCl >/= 30 ml/min)  Once,   R    Comments:  Baseline for enoxaparin therapy IF NOT ALREADY DRAWN.  Notify MD if PLT < 100 K.    Signed and Held   Signed and Held  Creatinine, serum  (enoxaparin (LOVENOX)    CrCl >/= 30 ml/min)  Once,   R    Comments:  Baseline for enoxaparin therapy IF NOT ALREADY DRAWN.    Signed and Held   Signed and Held  Creatinine, serum  (enoxaparin (LOVENOX)    CrCl >/= 30 ml/min)  Weekly,   R    Comments:  while on enoxaparin therapy    Signed and Held   Signed and Held  CBC  Tomorrow morning,   R     Signed and Held   Signed and Held  Comprehensive metabolic panel  Tomorrow  morning,   R     Signed and Held      Vitals/Pain Today's Vitals   07/16/17 1531 07/16/17 1535 07/16/17 1601 07/16/17 1700  BP: 139/71  (!) 143/73   Pulse: 89  77 82  Resp:  14 (!) 25 15  Temp:      TempSrc:      SpO2: 96%  96% 96%    Isolation Precautions No active isolations  Medications Medications  iopamidol (ISOVUE-370) 76 % injection (not administered)  potassium chloride 10 mEq in 100 mL IVPB (0 mEq Intravenous Stopped 07/16/17 1505)  potassium chloride SA (K-DUR,KLOR-CON) CR tablet 40 mEq (40 mEq Oral Given 07/16/17 1438)    Mobility non-ambulatory

## 2017-07-17 ENCOUNTER — Encounter: Payer: Self-pay | Admitting: Internal Medicine

## 2017-07-17 ENCOUNTER — Observation Stay (HOSPITAL_BASED_OUTPATIENT_CLINIC_OR_DEPARTMENT_OTHER): Payer: Medicare Other

## 2017-07-17 DIAGNOSIS — E876 Hypokalemia: Secondary | ICD-10-CM | POA: Diagnosis not present

## 2017-07-17 DIAGNOSIS — R0600 Dyspnea, unspecified: Secondary | ICD-10-CM | POA: Diagnosis not present

## 2017-07-17 DIAGNOSIS — F418 Other specified anxiety disorders: Secondary | ICD-10-CM

## 2017-07-17 DIAGNOSIS — R0789 Other chest pain: Secondary | ICD-10-CM | POA: Diagnosis not present

## 2017-07-17 DIAGNOSIS — R079 Chest pain, unspecified: Secondary | ICD-10-CM | POA: Diagnosis not present

## 2017-07-17 DIAGNOSIS — I890 Lymphedema, not elsewhere classified: Secondary | ICD-10-CM | POA: Diagnosis not present

## 2017-07-17 DIAGNOSIS — I361 Nonrheumatic tricuspid (valve) insufficiency: Secondary | ICD-10-CM

## 2017-07-17 DIAGNOSIS — R609 Edema, unspecified: Secondary | ICD-10-CM | POA: Diagnosis not present

## 2017-07-17 DIAGNOSIS — E039 Hypothyroidism, unspecified: Secondary | ICD-10-CM | POA: Diagnosis not present

## 2017-07-17 DIAGNOSIS — E662 Morbid (severe) obesity with alveolar hypoventilation: Secondary | ICD-10-CM | POA: Diagnosis not present

## 2017-07-17 DIAGNOSIS — I10 Essential (primary) hypertension: Secondary | ICD-10-CM | POA: Diagnosis not present

## 2017-07-17 LAB — COMPREHENSIVE METABOLIC PANEL
ALBUMIN: 2.9 g/dL — AB (ref 3.5–5.0)
ALK PHOS: 82 U/L (ref 38–126)
ALT: 13 U/L — ABNORMAL LOW (ref 14–54)
AST: 18 U/L (ref 15–41)
Anion gap: 8 (ref 5–15)
BILIRUBIN TOTAL: 0.6 mg/dL (ref 0.3–1.2)
BUN: 13 mg/dL (ref 6–20)
CALCIUM: 8.2 mg/dL — AB (ref 8.9–10.3)
CO2: 30 mmol/L (ref 22–32)
Chloride: 101 mmol/L (ref 101–111)
Creatinine, Ser: 0.8 mg/dL (ref 0.44–1.00)
GFR calc Af Amer: >60 mL/min (ref >60)
GLUCOSE: 128 mg/dL — AB (ref 65–99)
Potassium: 3.5 mmol/L (ref 3.5–5.1)
Sodium: 139 mmol/L (ref 135–145)
TOTAL PROTEIN: 6.1 g/dL — AB (ref 6.5–8.1)

## 2017-07-17 LAB — CBC
HEMATOCRIT: 38.3 % (ref 36.0–46.0)
HEMOGLOBIN: 13 g/dL (ref 12.0–15.0)
MCH: 29.5 pg (ref 26.0–34.0)
MCHC: 33.9 g/dL (ref 30.0–36.0)
MCV: 86.8 fL (ref 78.0–100.0)
Platelets: 143 10*3/uL — ABNORMAL LOW (ref 150–400)
RBC: 4.41 MIL/uL (ref 3.87–5.11)
RDW: 15.4 % (ref 11.5–15.5)
WBC: 8.3 10*3/uL (ref 4.0–10.5)

## 2017-07-17 LAB — MRSA PCR SCREENING: MRSA BY PCR: NEGATIVE

## 2017-07-17 LAB — TROPONIN I
Troponin I: <0.03 ng/mL (ref <0.03)
Troponin I: <0.03 ng/mL

## 2017-07-17 LAB — MAGNESIUM: Magnesium: 2.5 mg/dL — ABNORMAL HIGH (ref 1.7–2.4)

## 2017-07-17 MED ORDER — HYDROCODONE-ACETAMINOPHEN 5-325 MG PO TABS: 1.0000 | ORAL_TABLET | Freq: Once | ORAL | Status: DC

## 2017-07-17 NOTE — Progress Notes (Signed)
PROGRESS NOTE  Tamara Morales RAQ:[id number] DOB: [birth year] DOA: 07/16/2017 PCP: Blanchie Serve, MD  HPI/Recap of past 24 hours:  Tamara Morales is a 84 y.o. year old female with medical history significant for chronic respiratory failure on 2 L nightly with CPAP secondary to obesity hypoventilation syndrome hypertension, hypothyroidism, OSA on CPAP, depression who presented on 07/16/2017 with chest pain and dyspnea.    This morning, patient denies any chest pain or dyspnea.  States she is only sleepy but feels much better.  Assessment/Plan: Active Problems:   Hypothyroidism   Essential hypertension   Dyspnea   Obesity hypoventilation syndrome (HCC)   Depression with anxiety   Lymphedema of both lower extremities   Hypokalemia   Chest pain   Hypomagnesemia    Atypical chest pain, stable.  Atypical in nature (burning pain, occurring at rest, lasting 45 minutes, in the middle of the chest, intermittent nausea, resolved on its own).  Troponin cycle has been negative x3.  EKG showed no acute ischemic changes.  Patient had a chest pain for 1 episode while mobilizing in her power wheelchair.  She is not active at baseline (lives in [place]). -Would benefit from TTE to assure no wall motion abnormalities  -Monitor on telemetry  Dyspnea,  stable.  Occurred while in ED.  D-dimer was obtained which was greater than 1.  CTA negative for any large or moderate sized PE.  Patient has low pretest probability for PE with low Wells score (no tachycardia, no hypoxia, no new lower extremity swelling).  Unfortunately CTA not able to definitively rule out any smaller emboli to the lung. Concern for PE very low given patient not currently hypoxic and dyspnea has since resolved  Other possible etiology includes CHF.  Patient does have cardiomegaly on imaging (last TTE in 4562 showed no systolic or diastolic dysfunction), denies any worsening PND orthopnea we will see what TTE shows  -Venous duplex of  lower extremities to assure no DVTs -TTE as above  Hypokalemia and hypomagnesemia, resolved.  Most likely related to multiple diuretics (Lasix, metolazone, HCTZ) continue to monitor and replete as necessary.  Currently holding home HCTZ and metolazone  CAD -Continue aspirin  Hypertension, controlled.  On home HCTZ and losartan  Chronic lymphedema in bilateral lower extremities.  Nonpitting edema in both legs.  Patient states this is not new or worse.  Patient on multiple diuretics for this. -Continue oral home Lasix.  Holding home metolazone  Postoperative hypothyroidism.  TSH 0.17.  Her Synthroid dose has recently been increased prior to this hospitalization -Continue Synthroid  GERD.  Continue Protonix  Depression.  On home Zoloft  Code Status: DNR  Family Communication: No family at bedside  Disposition Plan: Anticipate discharge once TTE and venous duplex obtained   Consultants: None Procedures:  TTE and venous duplex pending  Antimicrobials:   None  Cultures:  None  DVT prophylaxis: Lovenox   Objective: Vitals:   07/16/17 2012 07/17/17 0500 07/17/17 0921 07/17/17 1313  BP: (!) 146/66 (!) 140/52 (!) 130/53 102/64  Pulse: 68 86 87 86  Resp: $Remo'18 18  18  'yrhLg$ Temp: 97.9 F (36.6 C) 99.2 F (37.3 C)  97.8 F (36.6 C)  TempSrc: Oral Oral  Oral  SpO2: 94% 93%  90%  Weight: 131.5 kg (289 lb 14.4 oz) 128.6 kg (283 lb 8 oz)    Height: $Remove'5\' 3"'NfkdOMX$  (1.6 m)       Intake/Output Summary (Last 24 hours) at 07/17/2017 1446 Last data filed at  07/17/2017 1000 Gross per 24 hour  Intake 395 ml  Output 1400 ml  Net -1005 ml   Filed Weights   07/16/17 2012 07/17/17 0500  Weight: 131.5 kg (289 lb 14.4 oz) 128.6 kg (283 lb 8 oz)    Exam:  General: Lying in bed comfortably, no acute distress HEENT: Moist oral mucosa Cardiovascular: Regular rate and rhythm, no appreciable murmurs, rubs, or gallops Respiratory: On room air, clear breath sounds on anterior chest  fields Abdomen: Soft, nondistended, nontender Extremities: 2+ nonpitting edema bilateral legs    Data Reviewed: CBC: Recent Labs  Lab 07/16/17 1109 07/17/17 0152  WBC 6.9 8.3  NEUTROABS 5.3  --   HGB 12.8 13.0  HCT 39.3 38.3  MCV 88.5 86.8  PLT 165 841*   Basic Metabolic Panel: Recent Labs  Lab 07/16/17 1109 07/17/17 0152 07/17/17 0825  NA 139 139  --   K 2.8* 3.5  --   CL 100* 101  --   CO2 31 30  --   GLUCOSE 125* 128*  --   BUN 17 13  --   CREATININE 1.06* 0.80  --   CALCIUM 8.2* 8.2*  --   MG 1.5*  --  2.5*   GFR: Estimated Creatinine Clearance: 68.5 mL/min (by C-G formula based on SCr of 0.8 mg/dL). Liver Function Tests: Recent Labs  Lab 07/16/17 1109 07/17/17 0152  AST 20 18  ALT 13* 13*  ALKPHOS 96 82  BILITOT 0.7 0.6  PROT 6.8 6.1*  ALBUMIN 3.2* 2.9*   Recent Labs  Lab 07/16/17 1109  LIPASE 22   No results for input(s): AMMONIA in the last 168 hours. Coagulation Profile: No results for input(s): INR, PROTIME in the last 168 hours. Cardiac Enzymes: Recent Labs  Lab 07/16/17 2030 07/17/17 0152 07/17/17 0825  TROPONINI <0.03 <0.03 <0.03   BNP (last 3 results) No results for input(s): PROBNP in the last 8760 hours. HbA1C: No results for input(s): HGBA1C in the last 72 hours. CBG: No results for input(s): GLUCAP in the last 168 hours. Lipid Profile: No results for input(s): CHOL, HDL, LDLCALC, TRIG, CHOLHDL, LDLDIRECT in the last 72 hours. Thyroid Function Tests: No results for input(s): TSH, T4TOTAL, FREET4, T3FREE, THYROIDAB in the last 72 hours. Anemia Panel: No results for input(s): VITAMINB12, FOLATE, FERRITIN, TIBC, IRON, RETICCTPCT in the last 72 hours. Urine analysis:    Component Value Date/Time   COLORURINE YELLOW 07/16/2017 1251   APPEARANCEUR CLEAR 07/16/2017 1251   LABSPEC 1.009 07/16/2017 1251   PHURINE 5.0 07/16/2017 1251   GLUCOSEU NEGATIVE 07/16/2017 1251   GLUCOSEU NEGATIVE 04/17/2012 1232   HGBUR NEGATIVE  07/16/2017 1251   BILIRUBINUR NEGATIVE 07/16/2017 1251   BILIRUBINUR neg 12/22/2016 1124   KETONESUR NEGATIVE 07/16/2017 1251   PROTEINUR NEGATIVE 07/16/2017 1251   UROBILINOGEN negative (A) 12/22/2016 1124   UROBILINOGEN 0.2 01/07/2015 1751   NITRITE NEGATIVE 07/16/2017 1251   LEUKOCYTESUR SMALL (A) 07/16/2017 1251   Sepsis Labs: $RemoveBefo'@LABRCNTIP'edbSeVsaWUC$ (procalcitonin:4,lacticidven:4)  ) Recent Results (from the past 240 hour(s))  MRSA PCR Screening     Status: None   Collection Time: 07/16/17 11:38 PM  Result Value Ref Range Status   MRSA by PCR NEGATIVE NEGATIVE Final    Comment:        The GeneXpert MRSA Assay (FDA approved for NASAL specimens only), is one component of a comprehensive MRSA colonization surveillance program. It is not intended to diagnose MRSA infection nor to guide or monitor treatment for MRSA infections.  Studies: Ct Angio Chest Pe W And/or Wo Contrast  Result Date: 07/16/2017 CLINICAL DATA:  Presents with concern for chest pain and abdominal pain after breakfast this morning Felt like a pressure on the chest EXAM: CT ANGIOGRAPHY CHEST WITH CONTRAST TECHNIQUE: Multidetector CT imaging of the chest was performed using the standard protocol during bolus administration of intravenous contrast. Multiplanar CT image reconstructions and MIPs were obtained to evaluate the vascular anatomy. CONTRAST:  145mL ISOVUE-370 IOPAMIDOL (ISOVUE-370) INJECTION 76% COMPARISON:  Chest x-ray 01/12/2017, CT of the abdomen and pelvis 06/27/2015, CT of the chest 11/03/2014 FINDINGS: Cardiovascular: Pulmonary arteries are well opacified to the segmental level, demonstrating no acute pulmonary embolus. Small peripheral pulmonary emboli would be difficult to exclude given the contrast bolus opacification. The heart is mildly enlarged. There is atherosclerotic calcification of the coronary arteries. No pericardial effusion. There is atherosclerotic calcification of the thoracic aorta. The  proximal arch is 3.1 centimeters. Distal arch is 3.1 centimeters. Descending aorta is not aneurysmal. Mediastinum/Nodes: No mediastinal, hilar, or axillary adenopathy. The esophagus is normal. Lungs/Pleura: Streaky areas of atelectasis particularly involving the lower lobes. No suspicious pulmonary nodules. No pleural effusions or edema. Upper Abdomen: Partially imaged left renal cyst. Stable appearance of splenic lesion, 2.5 x 3.1 centimeters. Findings are likely related to a benign hemangioma. Musculoskeletal: Scoliosis and moderate degenerative changes. No suspicious lytic or blastic lesions are identified. Other: Postoperative changes in the right breast compatible with history of right lumpectomy. Review of the MIP images confirms the above findings. IMPRESSION: 1. Technically adequate exam demonstrating no large or moderate pulmonary emboli. Small pulmonary emboli would be difficult to entirely exclude. 2. Cardiomegaly and coronary artery atherosclerosis. 3.  Aortic atherosclerosis.  (ICD10-I70.0) 4. 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 Aortic aneurysm NOS (ICD10-I71.9). Recommend annual imaging followup by CTA or MRA. This recommendation follows 2010 ACCF/AHA/AATS/ACR/ASA/SCA/SCAI/SIR/STS/SVM Guidelines for the Diagnosis and Management of Patients with Thoracic Aortic Disease. Circulation.2010; 121: e266-e369 5. Pulmonary atelectasis without suspicious pulmonary nodules or consolidations. 6. Multiple left renal cyst. 7. Stable probable splenic hemangioma. 8. Right lumpectomy changes. Electronically Signed   By: Nolon Nations M.D.   On: 07/16/2017 19:20    Scheduled Meds: . aspirin  81 mg Oral Daily  . enoxaparin (LOVENOX) injection  65 mg Subcutaneous Q24H  . furosemide  40 mg Oral Daily  . levothyroxine  125 mcg Oral Q0600  . loratadine  10 mg Oral Daily  . losartan  100 mg Oral Daily  . pantoprazole  40 mg Oral Q1200  . sertraline  75 mg Oral Daily    Continuous Infusions: . sodium  chloride 10 mL/hr at 07/16/17 2130     LOS: 0 days     Desiree Hane, MD [facility] Pager [phone]  If 7PM-7AM, please contact night-coverage [email or web address] Password [facility] 07/17/2017, 2:46 PM

## 2017-07-17 NOTE — Care Management Note (Signed)
Case Management Note  Patient Details  Name: Tamara Morales MRN: [id number] Date of Birth: 27-Oct-1930  Subjective/Objective: 84 y/o f admitted w/chest pain. Hx: CKD, DNR. From [facility]home. PT cons-await recc.                   Action/Plan:d/c plan home.   Expected Discharge Date:  (unknown)               Expected Discharge Plan:  Home/Self Care  In-House Referral:     Discharge planning Services  CM Consult  Post Acute Care Choice:    Choice offered to:     DME Arranged:    DME Agency:     HH Arranged:    HH Agency:     Status of Service:  In process, will continue to follow  If discussed at Long Length of Stay Meetings, dates discussed:    Additional Comments:  Dessa Phi, RN 07/17/2017, 10:59 AM

## 2017-07-17 NOTE — Progress Notes (Signed)
Pt's oxygen saturation on assessment is 76% on RA at this time. This RN placed pt on 2L of oxygen via [place] immediately and oxygen saturation reached 92%. Pt denies SOB or difficulty breathing. Will continue to monitor pt closely. On call Baltazar Najjar made aware.

## 2017-07-17 NOTE — Care Management Note (Signed)
Case Management Note  Patient Details  Name: Tamara Morales MRN: [id number] Date of Birth: Nov 27, 1930  Subjective/Objective: 84 y/o f admitted w/chest pain. Hx: CKD,DNR. From [facility]. Pt cons-await recc.                   Action/Plan:   Expected Discharge Date:  (unknown)               Expected Discharge Plan:  [place]  In-House Referral:     Discharge planning Services  CM Consult  Post Acute Care Choice:  Durable Medical Equipment(cane, power chair) Choice offered to:     DME Arranged:    DME Agency:     HH Arranged:    HH Agency:     Status of Service:  In process, will continue to follow  If discussed at Long Length of Stay Meetings, dates discussed:    Additional Comments:  Dessa Phi, RN 07/17/2017, 12:05 PM

## 2017-07-17 NOTE — Progress Notes (Signed)
  Echocardiogram 2D Echocardiogram has been performed.  Alando Colleran T Munir [place] 07/17/2017, 4:56 PM

## 2017-07-17 NOTE — Progress Notes (Signed)
Bilateral lower extremity venous duplex has been completed. Negative for obvious evidence of DVT. 07/17/17 2:21 PM Carlos Levering RVT

## 2017-07-17 NOTE — Progress Notes (Signed)
Pt. unable to tolerate CPAP mask from last evening, made aware to notify if wanting to try another >/< size mask.

## 2017-07-17 NOTE — Care Management Obs Status (Signed)
[facility] NOTIFICATION   Patient Details  Name: Tamara Morales MRN: [id number] Date of Birth: 13-Oct-1930   Medicare Observation Status Notification Given:  Yes    MahabirJuliann Pulse, RN 07/17/2017, 12:07 PM

## 2017-07-17 NOTE — Progress Notes (Addendum)
Patient's daughter phoned RN r/t  the progress of the patient. The patient was asleep

## 2017-07-17 NOTE — Progress Notes (Signed)
Pt. set up with CPAP, humidifier filled, oxygen line place in at 2 lpm-only uses at h/s, M/FFM, tolerating well, RN aware, made aware to notify if needed.

## 2017-07-18 ENCOUNTER — Telehealth: Payer: Self-pay

## 2017-07-18 DIAGNOSIS — R0789 Other chest pain: Secondary | ICD-10-CM | POA: Diagnosis not present

## 2017-07-18 DIAGNOSIS — I10 Essential (primary) hypertension: Secondary | ICD-10-CM

## 2017-07-18 DIAGNOSIS — E039 Hypothyroidism, unspecified: Secondary | ICD-10-CM

## 2017-07-18 DIAGNOSIS — R079 Chest pain, unspecified: Secondary | ICD-10-CM

## 2017-07-18 DIAGNOSIS — E876 Hypokalemia: Secondary | ICD-10-CM

## 2017-07-18 LAB — URINE CULTURE

## 2017-07-18 NOTE — Discharge Summary (Addendum)
Physician Discharge Summary  Tamara Morales UDJ:[id number] DOB: [birth year] DOA: 07/16/2017  PCP: Tamara Serve, MD  Admit date: 07/16/2017 Discharge date: 07/18/2017  Admitted From: Independent living facility  Disposition:  Independent Living facility   Recommendations for Outpatient Follow-up:  1. Follow up with PCP in 1-2 weeks 2. Please obtain BMP/CBC in one week 3. Please follow up low TSH  Home Health: No  Equipment/Devices: None  Discharge Condition: Good  CODE STATUS: DNR Diet recommendation: Cardiac  Brief/Interim Summary: Tamara Morales is a 84 y.o. year old female with medical history significant for chronic respiratory failure on 2 L nightly with CPAP secondary to obesity hypoventilation syndrome hypertension, hypothyroidism, OSA on CPAP, depression who presented on 07/16/2017 with chest pain and dyspnea.    Chest pain Atypical in character.  Troponins negative.  Echocardiogram and ECG unremarkable.    Dyspnea Dimer elevated, CTA negative for PE.  Doppler to lower extremities normal.  Hypokalemia Resolved in hospital with supplementation  Hypothyroidism Continued levothyroxine (TSH was low).  Asymptomatic bacteriuria No symptoms, no benefit to treatment.     Discharge Diagnoses:  Active Problems:   Hypothyroidism   Essential hypertension   Dyspnea   Obesity hypoventilation syndrome (HCC)   Depression with anxiety   Lymphedema of both lower extremities   Hypokalemia   Chest pain   Hypomagnesemia    Discharge Instructions  Discharge Instructions    Diet - low sodium heart healthy   Complete by:  As directed    Discharge instructions   Complete by:  As directed    From Dr. Loleta Morales: You were admitted for chest pain.  At first, we were concerned that your pain and weakness might be from a heart attack, but that has been effectively ruled out with the blood tests and ultrasound of your heart.  We also ruled out a blood clot in the lung.    We  may never know what caused the episode, but if you have another like it, it would be the right thing to do to call nursing staff at [facility] and also to call your primary care doctor for follow up.  Resume your home medicines, including your home potassium.  CALL YOUR PRIMARY DOCTOR FOR A ROUTINE FOLLOW UP VISIT WITHIN THE NEXT WEEK   Increase activity slowly   Complete by:  As directed      Allergies as of 07/18/2017      Reactions   Azithromycin Itching   Beta Adrenergic Blockers    Depression   Ciprofloxacin Other (See Comments)   REACTION: edgy and very jumpy    Codeine Nausea And Vomiting   Epinephrine Other (See Comments)   REACTION: rapid pulse, sweats   Klonopin [clonazepam] Other (See Comments)   Makes her feel very suicidal    Oxycodone Other (See Comments)   Patient felt like it altered her mental status "Crazy" . Hallucinations later as well.   Paxil [paroxetine Hydrochloride] Other (See Comments)   Sever depression    Prednisone    "makes me feel really bad"   Propoxyphene Hcl Nausea And Vomiting   Torsemide    Tramadol Other (See Comments)   Feels "jittery"   Vicodin [hydrocodone-acetaminophen] Other (See Comments)   Hallucinations   Meloxicam Diarrhea   Vancomycin Rash   Localized rash related to infusion rate.      Medication List    TAKE these medications   acetaminophen 325 MG tablet Commonly known as:  TYLENOL Take 650 mg by mouth  every 8 (eight) hours as needed for mild pain or moderate pain.   aspirin 81 MG tablet Take 81 mg by mouth daily.   docusate sodium 100 MG capsule Commonly known as:  COLACE Take 1 capsule (100 mg total) by mouth 2 (two) times daily as needed for mild constipation or moderate constipation.   furosemide 40 MG tablet Commonly known as:  LASIX Take 1 tablet (40 mg total) by mouth 2 (two) times daily. What changed:  when to take this   hydrochlorothiazide 25 MG tablet Commonly known as:  HYDRODIURIL TAKE 1 TABLET  DAILY. What changed:    how much to take  how to take this  when to take this   levothyroxine 125 MCG tablet Commonly known as:  SYNTHROID, LEVOTHROID Take 1 tablet (125 mcg total) by mouth daily. What changed:  how much to take   loratadine 10 MG tablet Commonly known as:  CLARITIN Take 1 tablet (10 mg total) by mouth daily.   losartan 100 MG tablet Commonly known as:  COZAAR Take 1 tablet (100 mg total) by mouth daily. Hold for SBP less than 110.   metolazone 2.5 MG tablet Commonly known as:  ZAROXOLYN Take 1 tablet (2.5 mg total) by mouth daily.   nystatin cream Commonly known as:  MYCOSTATIN Apply 1 application topically 2 (two) times daily. To right posterior knee skin fold area with redness.   omeprazole 20 MG capsule Commonly known as:  PRILOSEC TAKE (1) CAPSULE DAILY.   ondansetron 4 MG tablet Commonly known as:  ZOFRAN Take 1 tablet (4 mg total) by mouth every 6 (six) hours as needed for nausea.   potassium chloride 20 MEQ packet Commonly known as:  KLOR-CON Take 60 mEq by mouth every morning.   potassium chloride 20 MEQ packet Commonly known as:  KLOR-CON Take 40 mEq by mouth at bedtime.   sertraline 50 MG tablet Commonly known as:  ZOLOFT Take one and a half tablet daily for anxiety       Allergies  Allergen Reactions  . Azithromycin Itching  . Beta Adrenergic Blockers     Depression   . Ciprofloxacin Other (See Comments)    REACTION: edgy and very jumpy   . Codeine Nausea And Vomiting  . Epinephrine Other (See Comments)    REACTION: rapid pulse, sweats  . Klonopin [Clonazepam] Other (See Comments)    Makes her feel very suicidal   . Oxycodone Other (See Comments)    Patient felt like it altered her mental status "Crazy" . Hallucinations later as well.  [name] Paxil [Paroxetine Hydrochloride] Other (See Comments)    Sever depression   . Prednisone     "makes me feel really bad"  . Propoxyphene Hcl Nausea And Vomiting  . Torsemide   .  Tramadol Other (See Comments)    Feels "jittery"  . Vicodin [Hydrocodone-Acetaminophen] Other (See Comments)    Hallucinations  . Meloxicam Diarrhea  . Vancomycin Rash    Localized rash related to infusion rate.    Consultations:  None   Procedures/Studies: Ct Angio Chest Pe W And/or Wo Contrast  Result Date: 07/16/2017 CLINICAL DATA:  Presents with concern for chest pain and abdominal pain after breakfast this morning Felt like a pressure on the chest EXAM: CT ANGIOGRAPHY CHEST WITH CONTRAST TECHNIQUE: Multidetector CT imaging of the chest was performed using the standard protocol during bolus administration of intravenous contrast. Multiplanar CT image reconstructions and MIPs were obtained to evaluate the vascular anatomy. CONTRAST:  154mL ISOVUE-370 IOPAMIDOL (ISOVUE-370) INJECTION 76% COMPARISON:  Chest x-ray 01/12/2017, CT of the abdomen and pelvis 06/27/2015, CT of the chest 11/03/2014 FINDINGS: Cardiovascular: Pulmonary arteries are well opacified to the segmental level, demonstrating no acute pulmonary embolus. Small peripheral pulmonary emboli would be difficult to exclude given the contrast bolus opacification. The heart is mildly enlarged. There is atherosclerotic calcification of the coronary arteries. No pericardial effusion. There is atherosclerotic calcification of the thoracic aorta. The proximal arch is 3.1 centimeters. Distal arch is 3.1 centimeters. Descending aorta is not aneurysmal. Mediastinum/Nodes: No mediastinal, hilar, or axillary adenopathy. The esophagus is normal. Lungs/Pleura: Streaky areas of atelectasis particularly involving the lower lobes. No suspicious pulmonary nodules. No pleural effusions or edema. Upper Abdomen: Partially imaged left renal cyst. Stable appearance of splenic lesion, 2.5 x 3.1 centimeters. Findings are likely related to a benign hemangioma. Musculoskeletal: Scoliosis and moderate degenerative changes. No suspicious lytic or blastic lesions are  identified. Other: Postoperative changes in the right breast compatible with history of right lumpectomy. Review of the MIP images confirms the above findings. IMPRESSION: 1. Technically adequate exam demonstrating no large or moderate pulmonary emboli. Small pulmonary emboli would be difficult to entirely exclude. 2. Cardiomegaly and coronary artery atherosclerosis. 3.  Aortic atherosclerosis.  (ICD10-I70.0) 4. 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 Aortic aneurysm NOS (ICD10-I71.9). Recommend annual imaging followup by CTA or MRA. This recommendation follows 2010 ACCF/AHA/AATS/ACR/ASA/SCA/SCAI/SIR/STS/SVM Guidelines for the Diagnosis and Management of Patients with Thoracic Aortic Disease. Circulation.2010; 121: e266-e369 5. Pulmonary atelectasis without suspicious pulmonary nodules or consolidations. 6. Multiple left renal cyst. 7. Stable probable splenic hemangioma. 8. Right lumpectomy changes. Electronically Signed   By: Nolon Nations M.D.   On: 07/16/2017 19:20   Echocardiogram  Study Conclusions  - Left ventricle: The cavity size was normal. There was mild   concentric hypertrophy. Systolic function was normal. The   estimated ejection fraction was in the range of 60% to 65%. Wall   motion was normal; there were no regional wall motion   abnormalities. Doppler parameters are consistent with abnormal   left ventricular relaxation (grade 1 diastolic dysfunction). - Aortic valve: Transvalvular velocity was within the normal range.   There was no stenosis. There was no regurgitation. - Mitral valve: Transvalvular velocity was within the normal range.   There was no evidence for stenosis. There was no regurgitation. - Left atrium: The atrium was mildly dilated. - Right ventricle: The cavity size was normal. Wall thickness was   normal. Systolic function was normal. - Right atrium: The atrium was moderately dilated. - Tricuspid valve: There was moderate regurgitation. - Pulmonary arteries:  Systolic pressure was moderately increased.   PA peak pressure: 55 mm Hg (S).    VAS [place] Lower extremities Final Interpretation: Right: There is no evidence of deep vein thrombosis in the lower extremity.There is no evidence of superficial venous thrombosis. No cystic structure found in the popliteal fossa. Left: There is no evidence of deep vein thrombosis in the lower extremity.There is no evidence of superficial venous thrombosis. No cystic structure found in the popliteal fossa.  *See table(s) above for measurements and observations.  Electronically signed by Deitra Mayo on 07/17/2017 at 4:00:41 PM.     Subjective: Feels well.  No further chest pain, dyspnea.  No further faint spells. No nausea, vomiting, diarrhea, confusion, weakness.  Discharge Exam: Vitals:   07/17/17 2141 07/18/17 0452  BP: 122/67 (!) 116/59  Pulse: (!) 105 88  Resp: 17 18  Temp: 98.4 F (36.9  C) 99.8 F (37.7 C)  SpO2: 92% 95%   Vitals:   07/17/17 0921 07/17/17 1313 07/17/17 2141 07/18/17 0452  BP: (!) 130/53 102/64 122/67 (!) 116/59  Pulse: 87 86 (!) 105 88  Resp:  $Remo'18 17 18  'nnoKm$ Temp:  97.8 F (36.6 C) 98.4 F (36.9 C) 99.8 F (37.7 C)  TempSrc:  Oral Oral Axillary  SpO2:  90% 92% 95%  Weight:      Height:        General: Pt is alert, awake, not in acute distress Cardiovascular: RRR, S1/S2 +, no rubs, no gallops Respiratory: CTA bilaterally, no wheezing, no rhonchi Abdominal: Soft, NT, ND, bowel sounds + Extremities: no edema, no cyanosis    The results of significant diagnostics from this hospitalization (including imaging, microbiology, ancillary and laboratory) are listed below for reference.     Microbiology: Recent Results (from the past 240 hour(s))  Urine culture     Status: Abnormal   Collection Time: 07/16/17 12:51 PM  Result Value Ref Range Status   Specimen Description URINE, RANDOM  Final   Special Requests NONE  Final   Culture >=100,000 COLONIES/mL ESCHERICHIA  COLI (A)  Final   Report Status 07/18/2017 FINAL  Final   Organism ID, Bacteria ESCHERICHIA COLI (A)  Final      Susceptibility   Escherichia coli - MIC*    AMPICILLIN <=2 SENSITIVE Sensitive     CEFAZOLIN <=4 SENSITIVE Sensitive     CEFTRIAXONE <=1 SENSITIVE Sensitive     CIPROFLOXACIN <=0.25 SENSITIVE Sensitive     GENTAMICIN <=1 SENSITIVE Sensitive     IMIPENEM <=0.25 SENSITIVE Sensitive     NITROFURANTOIN <=16 SENSITIVE Sensitive     TRIMETH/SULFA <=20 SENSITIVE Sensitive     AMPICILLIN/SULBACTAM <=2 SENSITIVE Sensitive     PIP/TAZO <=4 SENSITIVE Sensitive     Extended ESBL NEGATIVE Sensitive     * >=100,000 COLONIES/mL ESCHERICHIA COLI  MRSA PCR Screening     Status: None   Collection Time: 07/16/17 11:38 PM  Result Value Ref Range Status   MRSA by PCR NEGATIVE NEGATIVE Final    Comment:        The GeneXpert MRSA Assay (FDA approved for NASAL specimens only), is one component of a comprehensive MRSA colonization surveillance program. It is not intended to diagnose MRSA infection nor to guide or monitor treatment for MRSA infections.      Labs: BNP (last 3 results) No results for input(s): BNP in the last 8760 hours. Basic Metabolic Panel: Recent Labs  Lab 07/16/17 1109 07/17/17 0152 07/17/17 0825  NA 139 139  --   K 2.8* 3.5  --   CL 100* 101  --   CO2 31 30  --   GLUCOSE 125* 128*  --   BUN 17 13  --   CREATININE 1.06* 0.80  --   CALCIUM 8.2* 8.2*  --   MG 1.5*  --  2.5*   Liver Function Tests: Recent Labs  Lab 07/16/17 1109 07/17/17 0152  AST 20 18  ALT 13* 13*  ALKPHOS 96 82  BILITOT 0.7 0.6  PROT 6.8 6.1*  ALBUMIN 3.2* 2.9*   Recent Labs  Lab 07/16/17 1109  LIPASE 22   No results for input(s): AMMONIA in the last 168 hours. CBC: Recent Labs  Lab 07/16/17 1109 07/17/17 0152  WBC 6.9 8.3  NEUTROABS 5.3  --   HGB 12.8 13.0  HCT 39.3 38.3  MCV 88.5 86.8  PLT 165 143*  Cardiac Enzymes: Recent Labs  Lab 07/16/17 2030  07/17/17 0152 07/17/17 0825  TROPONINI <0.03 <0.03 <0.03   BNP: Invalid input(s): POCBNP CBG: No results for input(s): GLUCAP in the last 168 hours. D-Dimer Recent Labs    07/16/17 1605  DDIMER 1.14*   Hgb A1c No results for input(s): HGBA1C in the last 72 hours. Lipid Profile No results for input(s): CHOL, HDL, LDLCALC, TRIG, CHOLHDL, LDLDIRECT in the last 72 hours. Thyroid function studies No results for input(s): TSH, T4TOTAL, T3FREE, THYROIDAB in the last 72 hours.  Invalid input(s): FREET3 Anemia work up No results for input(s): VITAMINB12, FOLATE, FERRITIN, TIBC, IRON, RETICCTPCT in the last 72 hours. Urinalysis    Component Value Date/Time   COLORURINE YELLOW 07/16/2017 1251   APPEARANCEUR CLEAR 07/16/2017 1251   LABSPEC 1.009 07/16/2017 1251   PHURINE 5.0 07/16/2017 1251   GLUCOSEU NEGATIVE 07/16/2017 1251   GLUCOSEU NEGATIVE 04/17/2012 1232   HGBUR NEGATIVE 07/16/2017 1251   BILIRUBINUR NEGATIVE 07/16/2017 1251   BILIRUBINUR neg 12/22/2016 1124   KETONESUR NEGATIVE 07/16/2017 1251   PROTEINUR NEGATIVE 07/16/2017 1251   UROBILINOGEN negative (A) 12/22/2016 1124   UROBILINOGEN 0.2 01/07/2015 1751   NITRITE NEGATIVE 07/16/2017 1251   LEUKOCYTESUR SMALL (A) 07/16/2017 1251   Sepsis Labs Invalid input(s): PROCALCITONIN,  WBC,  LACTICIDVEN Microbiology Recent Results (from the past 240 hour(s))  Urine culture     Status: Abnormal   Collection Time: 07/16/17 12:51 PM  Result Value Ref Range Status   Specimen Description URINE, RANDOM  Final   Special Requests NONE  Final   Culture >=100,000 COLONIES/mL ESCHERICHIA COLI (A)  Final   Report Status 07/18/2017 FINAL  Final   Organism ID, Bacteria ESCHERICHIA COLI (A)  Final      Susceptibility   Escherichia coli - MIC*    AMPICILLIN <=2 SENSITIVE Sensitive     CEFAZOLIN <=4 SENSITIVE Sensitive     CEFTRIAXONE <=1 SENSITIVE Sensitive     CIPROFLOXACIN <=0.25 SENSITIVE Sensitive     GENTAMICIN <=1 SENSITIVE  Sensitive     IMIPENEM <=0.25 SENSITIVE Sensitive     NITROFURANTOIN <=16 SENSITIVE Sensitive     TRIMETH/SULFA <=20 SENSITIVE Sensitive     AMPICILLIN/SULBACTAM <=2 SENSITIVE Sensitive     PIP/TAZO <=4 SENSITIVE Sensitive     Extended ESBL NEGATIVE Sensitive     * >=100,000 COLONIES/mL ESCHERICHIA COLI  MRSA PCR Screening     Status: None   Collection Time: 07/16/17 11:38 PM  Result Value Ref Range Status   MRSA by PCR NEGATIVE NEGATIVE Final    Comment:        The GeneXpert MRSA Assay (FDA approved for NASAL specimens only), is one component of a comprehensive MRSA colonization surveillance program. It is not intended to diagnose MRSA infection nor to guide or monitor treatment for MRSA infections.      Time coordinating discharge: More than 30 minutes  SIGNED:   Edwin Dada, MD  [facility] 07/18/2017, 5:35 PM

## 2017-07-18 NOTE — Telephone Encounter (Signed)
Possible re-admission to facility. This is a patient you were seeing at [facility] . [facility] F/U is needed if patient was re-admitted to facility upon discharge. Hospital discharge from [facility] on 07/18/17.

## 2017-07-18 NOTE — Care Management Note (Signed)
Case Management Note  Patient Details  Name: Tamara Morales MRN: [id number] Date of Birth: May 26, 1931  Subjective/Objective:                    Action/Plan:d/c home.   Expected Discharge Date:  07/18/17               Expected Discharge Plan:  Home/Self Care  In-House Referral:     Discharge planning Services  CM Consult  Post Acute Care Choice:  Durable Medical Equipment(cane, power chair) Choice offered to:     DME Arranged:    DME Agency:     HH Arranged:    HH Agency:     Status of Service:  Completed, signed off  If discussed at [employer] of Stay Meetings, dates discussed:    Additional Comments:  Dessa Phi, RN 07/18/2017, 11:04 AM

## 2017-07-18 NOTE — Progress Notes (Signed)
PT Cancellation Note  Patient Details Name: LARITA DEREMER MRN: [id number] DOB: 11-Feb-1931   Cancelled Treatment:    Reason Eval/Treat Not Completed: Spoke with RN who stated pt is set to d/c and PT eval no longer needed. Will sign off. Thanks.    Weston Anna, MPT Pager: [phone]

## 2017-07-20 ENCOUNTER — Ambulatory Visit (HOSPITAL_BASED_OUTPATIENT_CLINIC_OR_DEPARTMENT_OTHER): Payer: Medicare Other | Attending: Acute Care | Admitting: Pulmonary Disease

## 2017-07-20 DIAGNOSIS — Z79899 Other long term (current) drug therapy: Secondary | ICD-10-CM | POA: Diagnosis not present

## 2017-07-20 DIAGNOSIS — G4733 Obstructive sleep apnea (adult) (pediatric): Secondary | ICD-10-CM

## 2017-07-20 DIAGNOSIS — G4730 Sleep apnea, unspecified: Secondary | ICD-10-CM | POA: Diagnosis present

## 2017-07-20 DIAGNOSIS — I493 Ventricular premature depolarization: Secondary | ICD-10-CM | POA: Insufficient documentation

## 2017-07-24 ENCOUNTER — Encounter: Payer: Self-pay | Admitting: Internal Medicine

## 2017-07-24 ENCOUNTER — Ambulatory Visit: Payer: Self-pay | Admitting: Vascular Surgery

## 2017-07-24 ENCOUNTER — Non-Acute Institutional Stay: Payer: Medicare Other | Admitting: Internal Medicine

## 2017-07-24 VITALS — BP 140/74 | HR 84 | Temp 97.7°F | Resp 16 | Ht 61.0 in | Wt 283.0 lb

## 2017-07-24 DIAGNOSIS — E662 Morbid (severe) obesity with alveolar hypoventilation: Secondary | ICD-10-CM | POA: Diagnosis not present

## 2017-07-24 DIAGNOSIS — N183 Chronic kidney disease, stage 3 unspecified: Secondary | ICD-10-CM

## 2017-07-24 DIAGNOSIS — E039 Hypothyroidism, unspecified: Secondary | ICD-10-CM

## 2017-07-24 DIAGNOSIS — Z7189 Other specified counseling: Secondary | ICD-10-CM

## 2017-07-24 DIAGNOSIS — F411 Generalized anxiety disorder: Secondary | ICD-10-CM

## 2017-07-24 DIAGNOSIS — K219 Gastro-esophageal reflux disease without esophagitis: Secondary | ICD-10-CM

## 2017-07-24 DIAGNOSIS — E785 Hyperlipidemia, unspecified: Secondary | ICD-10-CM | POA: Diagnosis not present

## 2017-07-24 DIAGNOSIS — E876 Hypokalemia: Secondary | ICD-10-CM | POA: Diagnosis not present

## 2017-07-24 DIAGNOSIS — G4733 Obstructive sleep apnea (adult) (pediatric): Secondary | ICD-10-CM | POA: Diagnosis not present

## 2017-07-24 DIAGNOSIS — I10 Essential (primary) hypertension: Secondary | ICD-10-CM

## 2017-07-24 MED ORDER — SERTRALINE HCL 50 MG PO TABS: 50.0000 mg | ORAL_TABLET | Freq: Every day | ORAL | 3 refills | Status: DC

## 2017-07-24 MED ORDER — SERTRALINE HCL 25 MG PO TABS: ORAL_TABLET | ORAL | 3 refills | Status: DC

## 2017-07-24 MED ORDER — SERTRALINE HCL 50 MG PO TABS: ORAL_TABLET | ORAL | 3 refills | Status: DC

## 2017-07-24 NOTE — Procedures (Signed)
Patient Name: Tamara Morales, Tamara Morales Date: 07/20/2017 Gender: Female D.O.B: 09-14-1930 Age (years): 73 Referring Provider: Magdalen Spatz NP Height (inches): 63 Interpreting Physician: Kara Mead MD, ABSM Weight (lbs): 290 RPSGT: Zadie Rhine BMI: 51 MRN: [id number] Neck Size: 17.00 <br> <br> CLINICAL INFORMATION The patient is referred for a CPAP titration to treat sleep apnea.  Date of NPSG:  01/22/11 >> AHI 74.3, SpO2 low 76%  SLEEP STUDY TECHNIQUE As per the AASM Manual for the Scoring of Sleep and Associated Events v2.3 (April 2016) with a hypopnea requiring 4% desaturations.  The channels recorded and monitored were frontal, central and occipital EEG, electrooculogram (EOG), submentalis EMG (chin), nasal and oral airflow, thoracic and abdominal wall motion, anterior tibialis EMG, snore microphone, electrocardiogram, and pulse oximetry. Continuous positive airway pressure (CPAP) was initiated at the beginning of the study and titrated to treat sleep-disordered breathing.  MEDICATIONS Medications self-administered by patient taken the night of the study : BENEDRYL  TECHNICIAN COMMENTS Comments added by technician: [place]. Patient sleep in a recliner chair. O2 initiated due to low sats. Comments added by scorer: N/A   RESPIRATORY PARAMETERS Optimal PAP Pressure (cm): 6 AHI at Optimal Pressure (/hr): 0.4 Overall Minimal O2 (%): 78.00 Supine % at Optimal Pressure (%): 0 Minimal O2 at Optimal Pressure (%): 85.0   SLEEP ARCHITECTURE The study was initiated at 10:19:45 PM and ended at 5:00:34 AM.  Sleep onset time was 15.2 minutes and the sleep efficiency was 84.6%. The total sleep time was 339.0 minutes.  The patient spent 5.16% of the night in stage N1 sleep, 80.38% in stage N2 sleep, 0.00% in stage N3 and 14.45% in REM.Stage REM latency was 95.0 minutes  Wake after sleep onset was 46.6. Alpha intrusion was absent. Supine sleep was 0.00%.  CARDIAC DATA The  2 lead EKG demonstrated sinus rhythm. The mean heart rate was 82.34 beats per minute. Other EKG findings include: PVCs.   LEG MOVEMENT DATA The total Periodic Limb Movements of Sleep (PLMS) were 785. The PLMS index was 138.94. A PLMS index of <15 is considered normal in adults.  IMPRESSIONS - The optimal PAP pressure was 6 cm of water. - Central sleep apnea was not noted during this titration (CAI = 0.0/h). - Severe oxygen desaturations were observed during this titration (min O2 = 78.00%). Oxygen added by protocol & titrated to 3L - No snoring was audible during this study. - 2-lead EKG demonstrated: PVCs - Severe periodic limb movements were observed during this study. Arousals associated with PLMs were rare.   DIAGNOSIS - Obstructive Sleep Apnea (327.23 [G47.33 ICD-10])   RECOMMENDATIONS - Trial of CPAP therapy on 6 cm H2O with a Medium size Resmed Full Face Mask AirFit F20 mask and heated humidification. - 3L O2 should be blended into CPAP - Avoid alcohol, sedatives and other CNS depressants that may worsen sleep apnea and disrupt normal sleep architecture. - Sleep hygiene should be reviewed to assess factors that may improve sleep quality. - Weight management and regular exercise should be initiated or continued. - Return to Sleep Center for re-evaluation after 4 weeks of therapy   Kara Mead MD Board Certified in [place]

## 2017-07-24 NOTE — Patient Instructions (Signed)
Take zoloft 50 mg daily, script sent to pharmacy   High Cholesterol High cholesterol is a condition in which the blood has high levels of a white, waxy, fat-like substance (cholesterol). The human body needs small amounts of cholesterol. The liver makes all the cholesterol that the body needs. Extra (excess) cholesterol comes from the food that we eat. Cholesterol is carried from the liver by the blood through the blood vessels. If you have high cholesterol, deposits (plaques) may build up on the walls of your blood vessels (arteries). Plaques make the arteries narrower and stiffer. Cholesterol plaques increase your risk for heart attack and stroke. Work with your health care provider to keep your cholesterol levels in a healthy range. What increases the risk? This condition is more likely to develop in people who:  Eat foods that are high in animal fat (saturated fat) or cholesterol.  Are overweight.  Are not getting enough exercise.  Have a family history of high cholesterol.  What are the signs or symptoms? There are no symptoms of this condition. How is this diagnosed? This condition may be diagnosed from the results of a blood test.  If you are older than age 9, your health care provider may check your cholesterol every 4-6 years.  You may be checked more often if you already have high cholesterol or other risk factors for heart disease.  The blood test for cholesterol measures:  "Bad" cholesterol (LDL cholesterol). This is the main type of cholesterol that causes heart disease. The desired level for LDL is less than 100.  "Good" cholesterol (HDL cholesterol). This type helps to protect against heart disease by cleaning the arteries and carrying the LDL away. The desired level for HDL is 60 or higher.  Triglycerides. These are fats that the body can store or burn for energy. The desired number for triglycerides is lower than 150.  Total cholesterol. This is a measure of the  total amount of cholesterol in your blood, including LDL cholesterol, HDL cholesterol, and triglycerides. A healthy number is less than 200.  How is this treated? This condition is treated with diet changes, lifestyle changes, and medicines. Diet changes  This may include eating more whole grains, fruits, vegetables, nuts, and fish.  This may also include cutting back on red meat and foods that have a lot of added sugar. Lifestyle changes  Changes may include getting at least 40 minutes of aerobic exercise 3 times a week. Aerobic exercises include walking, biking, and swimming. Aerobic exercise along with a healthy diet can help you maintain a healthy weight.  Changes may also include quitting smoking. Medicines  Medicines are usually given if diet and lifestyle changes have failed to reduce your cholesterol to healthy levels.  Your health care provider may prescribe a statin medicine. Statin medicines have been shown to reduce cholesterol, which can reduce the risk of heart disease. Follow these instructions at home: Eating and drinking  If told by your health care provider:  Eat chicken (without skin), fish, veal, shellfish, ground [place] breast, and round or loin cuts of red meat.  Do not eat fried foods or fatty meats, such as hot dogs and salami.  Eat plenty of fruits, such as apples.  Eat plenty of vegetables, such as broccoli, potatoes, and carrots.  Eat beans, peas, and lentils.  Eat grains such as barley, rice, couscous, and bulgur wheat.  Eat pasta without cream sauces.  Use skim or nonfat milk, and eat low-fat or nonfat yogurt and  cheeses.  Do not eat or drink whole milk, cream, ice cream, egg yolks, or hard cheeses.  Do not eat stick margarine or tub margarines that contain trans fats (also called partially hydrogenated oils).  Do not eat saturated tropical oils, such as coconut oil and palm oil.  Do not eat cakes, cookies, crackers, or other baked goods that  contain trans fats.  General instructions  Exercise as directed by your health care provider. Increase your activity level with activities such as gardening, walking, and taking the stairs.  Take over-the-counter and prescription medicines only as told by your health care provider.  Do not use any products that contain nicotine or tobacco, such as cigarettes and e-cigarettes. If you need help quitting, ask your health care provider.  Keep all follow-up visits as told by your health care provider. This is important. Contact a health care provider if:  You are struggling to maintain a healthy diet or weight.  You need help to start on an exercise program.  You need help to stop smoking. Get help right away if:  You have chest pain.  You have trouble breathing. This information is not intended to replace advice given to you by your health care provider. Make sure you discuss any questions you have with your health care provider. Document Released: 06/05/2005 Document Revised: 01/01/2016 Document Reviewed: 12/04/2015 Elsevier Interactive Patient Education  [employer].

## 2017-07-24 NOTE — Progress Notes (Signed)
[facility]  Provider: Blanchie Serve MD   Location:  [place] of Service:  Clinic (12)  PCP: Blanchie Serve, MD Patient Care Team: Blanchie Serve, MD as PCP - General (Internal Medicine) Azucena Fallen, MD as Consulting Physician (Obstetrics and Gynecology) Chesley Mires, MD as Consulting Physician (Pulmonary Disease) Mast, Man X, NP as Nurse Practitioner (Internal Medicine) Christin Fudge, MD as Consulting Physician (Surgery)  Extended Emergency Contact Information Primary Emergency Contact: Boxman,Lee Address: [address]          [place], [place] [address] Johnnette Litter of [place] Phone: [phone] Mobile Phone: [phone] Relation: Daughter Secondary Emergency Contact: Ritta Slot [place] of [place] Mobile Phone: [phone] Relation: Daughter  Code Status:  DNR  Goals of Care: Advanced Directive information Advanced Directives 07/24/2017  Does Patient Have a Medical Advance Directive? Yes  Type of Advance Directive Living will;Healthcare Power of Attorney  Does patient want to make changes to medical advance directive? Yes (MAU/Ambulatory/Procedural Areas - Information given)  Copy of [place] in Chart? No - copy requested  Would patient like information on creating a medical advance directive? -  Pre-existing out of facility DNR order (yellow form or pink MOST form) -      Chief Complaint  Patient presents with  . Hospitalization Follow-up    follow up from hospital  . Medication Refill    zoloft  . FYI    Discuss goals of care    HPI: Patient is a 84 y.o. female seen today for hospital follow up visit. She was in the hospital from 07/16/17-07/18/17 with chest pain and dyspnea. Cardiac workup was negative. CTA chest negative for pulmonary embolism. Her lytes were repleted and she was discharged home to independent living facility [facility] at [facility]. She has medical history of chronic  respiratory failure on oxygen at night, obesity hypoventilation syndrome with CPAP, depression and anxiety among others. She denies any further chest pain. Breathing has been stable. She does feel anxious at times. She mentions taking only 25 mg of zoloft and has been prescribed 75 mg daily. She mentioned that 75 mg made her sleepy.   Past Medical History:  Diagnosis Date  . ARTHRITIS, KNEES, BILATERAL   . Breast CA ([place]) 2000   s/p R lumpectomy and XRT  . DEPRESSION   . DJD (degenerative joint disease) of knee   . GERD   . HYPERLIPIDEMIA   . HYPERTENSION   . HYPOTHYROIDISM    postsurgical  . Hypoxia   . MIGRAINE HEADACHE   . Morbid obesity ([place])   . OSA (obstructive sleep apnea) 01/17/2011 dx  . Oxygen dependent   . Urge incontinence   . UTI (lower urinary tract infection) 12/2014   Past Surgical History:  Procedure Laterality Date  . ABDOMINAL HYSTERECTOMY    . ADENOIDECTOMY    . BREAST BIOPSY  2000  . BREAST LUMPECTOMY  2000  . CATARACT EXTRACTION    . CHOLECYSTECTOMY  1995  . CYSTOSCOPY/URETEROSCOPY/HOLMIUM LASER Right 06/28/2015   Procedure: CYSTOSCOPY RIGHT RETROGRAD RIGHT URETEROSCOPY/HOLMIUM LASER WITH RIGHT STENT PLACEMENT;  Surgeon: Alexis Frock, MD;  Location: [facility];  Service: Urology;  Laterality: Right;  . PARATHYROIDECTOMY     2-3 removed  . REPLACEMENT TOTAL KNEE BILATERAL  1999, 2005  . Right leg femur repaired    . THYROIDECTOMY    . TONSILLECTOMY    . TUBAL LIGATION      reports that she  quit smoking about 43 years ago. Her smoking use included cigarettes. She has a 0.50 pack-year smoking history. she has never used smokeless tobacco. She reports that she does not drink alcohol or use drugs. Social History   Socioeconomic History  . Marital status: Married    Spouse name: Not on file  . Number of children: 5  . Years of education: Not on file  . Highest education level: Not on file  Social Needs  . Financial resource strain: Not hard at all  . Food  insecurity - worry: Never true  . Food insecurity - inability: Never true  . Transportation needs - medical: No  . Transportation needs - non-medical: No  Occupational History  . Occupation: RETIRED    Employer: RETIRED    Comment: worked on office  Tobacco Use  . Smoking status: Former Smoker    Packs/day: 0.25    Years: 2.00    Pack years: 0.50    Types: Cigarettes    Last attempt to quit: 06/19/1974    Years since quitting: 43.1  . Smokeless tobacco: Never Used  . Tobacco comment: Quit in late 20's  Substance and Sexual Activity  . Alcohol use: No    Alcohol/week: 0.0 oz    Comment: rare  . Drug use: No  . Sexual activity: No  Other Topics Concern  . Not on file  Social History Narrative   Married to husband that has dementia (he lives in the skilled nursing wing) and this is a big stressor. 5 children. 10 grandchildren. 2 greatgrandchildren. All children       Lives at [facility], in independent living.      Retired from multiple different Community education officer, university, Psychologist, educational.       Hobbies: Management consultant, write poetry, craft dolls     Family History  Problem Relation Age of Onset  . Emphysema Sister   . Coronary artery disease Neg Hx     Health Maintenance  Topic Date Due  . DEXA SCAN  09/07/1997  . TETANUS/TDAP  10/05/2019  . INFLUENZA VACCINE  Completed  . PNA vac Low Risk Adult  Completed    Allergies  Allergen Reactions  . Azithromycin Itching  . Beta Adrenergic Blockers     Depression   . Ciprofloxacin Other (See Comments)    REACTION: edgy and very jumpy   . Codeine Nausea And Vomiting  . Epinephrine Other (See Comments)    REACTION: rapid pulse, sweats  . Klonopin [Clonazepam] Other (See Comments)    Makes her feel very suicidal   . Oxycodone Other (See Comments)    Patient felt like it altered her mental status "Crazy" . Hallucinations later as well.  [name] Paxil [Paroxetine Hydrochloride] Other (See Comments)    Sever depression    . Prednisone     "makes me feel really bad"  . Propoxyphene Hcl Nausea And Vomiting  . Torsemide   . Tramadol Other (See Comments)    Feels "jittery"  . Vicodin [Hydrocodone-Acetaminophen] Other (See Comments)    Hallucinations  . Meloxicam Diarrhea  . Vancomycin Rash    Localized rash related to infusion rate.    Outpatient Encounter Medications as of 07/24/2017  Medication Sig  . acetaminophen (TYLENOL) 325 MG tablet Take 650 mg by mouth every 8 (eight) hours as needed for mild pain or moderate pain.   [name] aspirin 81 MG tablet Take 81 mg by mouth daily.   . Calcium Polycarbophil (FIBER-CAPS PO) Take 1  capsule by mouth as needed.  . docusate sodium (COLACE) 100 MG capsule Take 1 capsule (100 mg total) by mouth 2 (two) times daily as needed for mild constipation or moderate constipation.  . furosemide (LASIX) 40 MG tablet Take 1 tablet (40 mg total) by mouth 2 (two) times daily.  . hydrochlorothiazide (HYDRODIURIL) 25 MG tablet TAKE 1 TABLET DAILY.  [name] levothyroxine (SYNTHROID, LEVOTHROID) 125 MCG tablet Take 1 tablet (125 mcg total) by mouth daily.  [name] loratadine (CLARITIN) 10 MG tablet Take 1 tablet (10 mg total) by mouth daily.  [name] losartan (COZAAR) 100 MG tablet Take 1 tablet (100 mg total) by mouth daily. Hold for SBP less than 110.  . metolazone (ZAROXOLYN) 2.5 MG tablet Take 1 tablet (2.5 mg total) by mouth daily.  [name] nystatin cream (MYCOSTATIN) Apply 1 application topically 2 (two) times daily. To right posterior knee skin fold area with redness.  [name] omeprazole (PRILOSEC) 20 MG capsule TAKE (1) CAPSULE DAILY.  [name] ondansetron (ZOFRAN) 4 MG tablet Take 1 tablet (4 mg total) by mouth every 6 (six) hours as needed for nausea.  . potassium chloride (KLOR-CON) 20 MEQ packet Take 60 mEq by mouth every morning.  . potassium chloride (KLOR-CON) 20 MEQ packet Take 40 mEq by mouth at bedtime.  . sertraline (ZOLOFT) 50 MG tablet Take 1 tablet (50 mg total) by mouth daily.  . [DISCONTINUED]  sertraline (ZOLOFT) 25 MG tablet Take one and a half tablet daily for anxiety  . [DISCONTINUED] sertraline (ZOLOFT) 50 MG tablet Take one and a half tablet daily for anxiety (Patient not taking: Reported on 07/24/2017)  . [DISCONTINUED] sertraline (ZOLOFT) 50 MG tablet Take one and a half tablet daily for anxiety   No facility-administered encounter medications on file as of 07/24/2017.     Review of Systems  Constitutional: Negative for appetite change, chills and fever.  HENT: Negative for congestion.   Respiratory: Negative for cough and shortness of breath.   Cardiovascular: Negative for chest pain and palpitations.       Leg swelling has decreased  Gastrointestinal: Negative for diarrhea, nausea and vomiting.  Genitourinary: Negative for dysuria.  Musculoskeletal: Positive for gait problem.  Skin: Negative for rash.  Neurological: Negative for dizziness and headaches.  Psychiatric/Behavioral: The patient is nervous/anxious.     Vitals:   07/24/17 0828  BP: 140/74  Pulse: 84  Resp: 16  Temp: 97.7 F (36.5 C)  TempSrc: Oral  SpO2: 94%  Weight: 283 lb (128.4 kg)  Height: '5\' 1"'  (1.549 m)   Body mass index is 53.47 kg/m.   Wt Readings from Last 3 Encounters:  07/24/17 283 lb (128.4 kg)  07/20/17 290 lb (131.5 kg)  07/17/17 283 lb 8 oz (128.6 kg)   Physical Exam  Constitutional: She is oriented to person, place, and time. No distress.  Morbidly obese  HENT:  Head: Normocephalic and atraumatic.  Mouth/Throat: Oropharynx is clear and moist.  Eyes: Conjunctivae and EOM are normal. Pupils are equal, round, and reactive to light.  Neck: Neck supple.  Cardiovascular: Normal rate and regular rhythm.  Pulmonary/Chest: Effort normal. No respiratory distress.  Poor air movement, no added sounds  Abdominal: Soft. Bowel sounds are normal.  Musculoskeletal: She exhibits edema and deformity.  Edema present but subsided from before, chronic skin changes to LE  Lymphadenopathy:     She has no cervical adenopathy.  Neurological: She is alert and oriented to person, place, and time.  Skin: Skin is warm and dry. No  rash noted. She is not diaphoretic.  Psychiatric: She has a normal mood and affect.    Labs reviewed: Basic Metabolic Panel: Recent Labs    06/26/17 0655 07/16/17 1109 07/17/17 0152 07/17/17 0825  NA 141 139 139  --   K 3.5 2.8* 3.5  --   CL 103 100* 101  --   CO2 '28 31 30  ' --   GLUCOSE 107* 125* 128*  --   BUN '19 17 13  ' --   CREATININE 0.89* 1.06* 0.80  --   CALCIUM 8.7 8.2* 8.2*  --   MG  --  1.5*  --  2.5*   Liver Function Tests: Recent Labs    01/12/17 0307  04/03/17 06/26/17 0655 07/16/17 1109 07/17/17 0152  AST 15   < > '16 16 20 18  ' ALT 13*   < > 10 16 13* 13*  ALKPHOS 73   < > 86  --  96 82  BILITOT 0.7  --   --  0.6 0.7 0.6  PROT 6.5  --   --  6.4 6.8 6.1*  ALBUMIN 3.4*  --   --   --  3.2* 2.9*   < > = values in this interval not displayed.   Recent Labs    07/16/17 1109  LIPASE 22   No results for input(s): AMMONIA in the last 8760 hours. CBC: Recent Labs    01/12/17 0307  06/26/17 0655 07/16/17 1109 07/17/17 0152  WBC 6.1   < > 6.2 6.9 8.3  NEUTROABS 4.2  --   --  5.3  --   HGB 12.8   < > 13.3 12.8 13.0  HCT 38.5   < > 39.3 39.3 38.3  MCV 89.1  --  84.9 88.5 86.8  PLT 158   < > 147 165 143*   < > = values in this interval not displayed.   Cardiac Enzymes: Recent Labs    07/16/17 2030 07/17/17 0152 07/17/17 0825  TROPONINI <0.03 <0.03 <0.03   BNP: Invalid input(s): POCBNP Lab Results  Component Value Date   HGBA1C 6.3 (H) 06/26/2017   Lab Results  Component Value Date   TSH 0.17 (L) 06/26/2017   No results found for: VITAMINB12 No results found for: FOLATE No results found for: IRON, TIBC, FERRITIN  Lipid Panel: Recent Labs    06/26/17 0655  CHOL 171  HDL 45*  TRIG 128  CHOLHDL 3.8   Lab Results  Component Value Date   HGBA1C 6.3 (H) 06/26/2017    Procedures since last  visit: Imaging reviewed.   Assessment/Plan  1. GAD (generalized anxiety disorder) Change zoloft to 50 mg daily. Reviewed rx plan, avoid valium with her obesity, hypoventilation syndrome, OSA and chronic respiratory failure, no refill for valium provided.   2. Essential hypertension Stable, continue current regimen, monitor for chest pain. C/w aspirin  3. Gastroesophageal reflux disease without esophagitis Continue omeprazole, controlled symptom  4. Obesity hypoventilation syndrome (HCC) Continue CPAP, dietary counselling provided  5. Hypothyroidism, unspecified type Lab Results  Component Value Date   TSH 0.17 (L) 06/26/2017   Recheck TSH, c/w levothyroxine for now  6. CKD (chronic kidney disease) stage 3, GFR 30-59 ml/min (HCC) Check BMP  7. Hyperlipidemia LDL goal <100 Pt does not want statin at present, wants to try dietary modification, monitor and recheck labs  8. Goals of care, counseling/discussion Reviewed goals of care with patient. She has a living will and HCPOA paperwork, copy requested. She would  not was CPR in absence of pulse or breathing. A DNR form filled out and signed. In case of acute illness and need for hospitalization, she would like to be sent to the hospital but limited interventions done over there. She does not want feeding tube, mechanical ventilation, intubation and/or cardioversion. She would like antibiotic if indicated and iv fluids for defined trial period. MOST form filled out and signed. Goals of care discussion between 8:45 am- 9:05 am. Copies made for our records.   9. Hypokalemia C/w kcl supplement, check bmp.     Labs/tests ordered:   Lab Orders     CMP with eGFR([facility])     CBC with Differential/Platelets     TSH   Next appointment: 3 months  Communication: reviewed care plan with patient    Blanchie Serve, MD Internal Medicine [place], [place] [address] Cell Phone  (Monday-Friday 8 am - 5 pm): [phone] On Call: [phone] and follow prompts after 5 pm and on weekends Office Phone: [phone] Office Fax: [phone]

## 2017-07-25 NOTE — Addendum Note (Signed)
Addended by: Eilene Ghazi on: 07/25/2017 03:25 PM   Modules accepted: Orders

## 2017-07-25 NOTE — Addendum Note (Signed)
Addended by: Eilene Ghazi on: 07/25/2017 03:32 PM   Modules accepted: Orders

## 2017-07-26 ENCOUNTER — Other Ambulatory Visit: Payer: Self-pay | Admitting: Acute Care

## 2017-07-26 DIAGNOSIS — F419 Anxiety disorder, unspecified: Secondary | ICD-10-CM | POA: Diagnosis not present

## 2017-07-26 DIAGNOSIS — I10 Essential (primary) hypertension: Secondary | ICD-10-CM | POA: Diagnosis not present

## 2017-07-26 DIAGNOSIS — E039 Hypothyroidism, unspecified: Secondary | ICD-10-CM | POA: Diagnosis not present

## 2017-07-26 DIAGNOSIS — N183 Chronic kidney disease, stage 3 (moderate): Secondary | ICD-10-CM | POA: Diagnosis not present

## 2017-07-26 DIAGNOSIS — E890 Postprocedural hypothyroidism: Secondary | ICD-10-CM | POA: Diagnosis not present

## 2017-07-26 DIAGNOSIS — E876 Hypokalemia: Secondary | ICD-10-CM | POA: Diagnosis not present

## 2017-07-26 LAB — COMPLETE METABOLIC PANEL WITH GFR
AG RATIO: 1.4 (calc) (ref 1.0–2.5)
ALT: 10 U/L (ref 6–29)
AST: 12 U/L (ref 10–35)
Albumin: 3.6 g/dL (ref 3.6–5.1)
Alkaline phosphatase (APISO): 87 U/L (ref 33–130)
BILIRUBIN TOTAL: 0.5 mg/dL (ref 0.2–1.2)
BUN: 18 mg/dL (ref 7–25)
CALCIUM: 8.3 mg/dL — AB (ref 8.6–10.4)
CHLORIDE: 102 mmol/L (ref 98–110)
CO2: 30 mmol/L (ref 20–32)
Creat: 0.83 mg/dL (ref 0.60–0.88)
GFR, EST AFRICAN AMERICAN: 75 mL/min/{1.73_m2} (ref >=60)
GFR, EST NON AFRICAN AMERICAN: 65 mL/min/{1.73_m2} (ref >=60)
GLOBULIN: 2.5 g/dL (ref 1.9–3.7)
Glucose, Bld: 99 mg/dL (ref 65–99)
Potassium: 3.5 mmol/L (ref 3.5–5.3)
SODIUM: 141 mmol/L (ref 135–146)
TOTAL PROTEIN: 6.1 g/dL (ref 6.1–8.1)

## 2017-07-26 LAB — CBC WITH DIFFERENTIAL/PLATELET
Basophils Absolute: 101 cells/uL (ref 0–200)
Basophils Relative: 1.4 %
EOS ABS: 223 {cells}/uL (ref 15–500)
Eosinophils Relative: 3.1 %
HEMATOCRIT: 38.5 % (ref 35.0–45.0)
Hemoglobin: 13.2 g/dL (ref 11.7–15.5)
LYMPHS ABS: 1181 {cells}/uL (ref 850–3900)
MCH: 29 pg (ref 27.0–33.0)
MCHC: 34.3 g/dL (ref 32.0–36.0)
MCV: 84.6 fL (ref 80.0–100.0)
MPV: 11.7 fL (ref 7.5–12.5)
Monocytes Relative: 7.4 %
NEUTROS PCT: 71.7 %
Neutro Abs: 5162 cells/uL (ref 1500–7800)
PLATELETS: 159 10*3/uL (ref 140–400)
RBC: 4.55 10*6/uL (ref 3.80–5.10)
RDW: 16.8 % — AB (ref 11.0–15.0)
TOTAL LYMPHOCYTE: 16.4 %
WBC: 7.2 10*3/uL (ref 3.8–10.8)
WBCMIX: 533 {cells}/uL (ref 200–950)

## 2017-07-26 LAB — T4, FREE: Free T4: 1.7 ng/dL (ref 0.8–1.8)

## 2017-07-26 LAB — TSH: TSH: 0.29 mIU/L — ABNORMAL LOW (ref 0.40–4.50)

## 2017-07-27 ENCOUNTER — Other Ambulatory Visit: Payer: Self-pay | Admitting: *Deleted

## 2017-07-27 DIAGNOSIS — E039 Hypothyroidism, unspecified: Secondary | ICD-10-CM

## 2017-07-27 MED ORDER — LEVOTHYROXINE SODIUM 100 MCG PO TABS: 100.0000 ug | ORAL_TABLET | Freq: Every day | ORAL | 2 refills | Status: DC

## 2017-07-28 ENCOUNTER — Telehealth: Payer: Self-pay | Admitting: Pulmonary Disease

## 2017-07-28 NOTE — Telephone Encounter (Signed)
CPAP 06/13/17 to 07/12/17 >> used on 26 of 30 nights with average 3 hrs 52 min.  Average AHI 0.2 with CPAP 10 cm H2O.   Please let her know her download shows good control of sleep apnea, but she appears to be having mask leak.  If she has noticed this, then please have her mask refit and change CPAP pressure to 8 cm H2O.  If she is not having any problems at this time, then no changes are needed.

## 2017-07-30 ENCOUNTER — Telehealth: Payer: Self-pay

## 2017-07-30 NOTE — Telephone Encounter (Signed)
Spoke with pt. She is aware of her download results. States that she has not noticed any issues with her mask. Nothing further was needed at this time.

## 2017-07-30 NOTE — Telephone Encounter (Signed)
Patient had some concerns about her lab appointment and upcoming appointments. Patient was provided with new appointment times and dates. Patient is aware and happy that things were cleared up.

## 2017-07-31 ENCOUNTER — Other Ambulatory Visit: Payer: Self-pay

## 2017-07-31 DIAGNOSIS — G4733 Obstructive sleep apnea (adult) (pediatric): Secondary | ICD-10-CM

## 2017-07-31 NOTE — Progress Notes (Signed)
LMTCB on patient's voicemail

## 2017-08-02 ENCOUNTER — Other Ambulatory Visit: Payer: Self-pay

## 2017-08-03 ENCOUNTER — Encounter: Payer: Self-pay | Admitting: Internal Medicine

## 2017-08-10 DIAGNOSIS — F419 Anxiety disorder, unspecified: Secondary | ICD-10-CM | POA: Diagnosis not present

## 2017-08-13 DIAGNOSIS — F419 Anxiety disorder, unspecified: Secondary | ICD-10-CM | POA: Diagnosis not present

## 2017-08-23 DIAGNOSIS — F419 Anxiety disorder, unspecified: Secondary | ICD-10-CM | POA: Diagnosis not present

## 2017-08-27 ENCOUNTER — Telehealth: Payer: Self-pay | Admitting: Pulmonary Disease

## 2017-08-27 DIAGNOSIS — G4733 Obstructive sleep apnea (adult) (pediatric): Secondary | ICD-10-CM

## 2017-08-27 NOTE — Telephone Encounter (Signed)
Spoke with pt, she states she has gotten 2 concentrators from [facility] but they weren't new. She states her concentrator is not working well. She states it has a screeching noise when it starts. It does not sound the same like before and she is sure something is wrong with it.   I called [facility] and he states I needed to fax over a copy of the CPAP titration to Heywood Iles at [phone]. Faced over report and will await response.

## 2017-08-28 NOTE — Telephone Encounter (Signed)
Called Tamara Morales at [facility] and left message for her to call back.

## 2017-08-29 DIAGNOSIS — Z85828 Personal history of other malignant neoplasm of skin: Secondary | ICD-10-CM | POA: Diagnosis not present

## 2017-08-29 DIAGNOSIS — D2221 Melanocytic nevi of right ear and external auricular canal: Secondary | ICD-10-CM | POA: Diagnosis not present

## 2017-08-29 DIAGNOSIS — L821 Other seborrheic keratosis: Secondary | ICD-10-CM | POA: Diagnosis not present

## 2017-08-29 NOTE — Telephone Encounter (Signed)
Called [facility] and used their employee directory to contact [employer]. lmtcb x2 for [place].

## 2017-08-29 NOTE — Telephone Encounter (Addendum)
Called Tamara Morales with [facility]. She states that they need an updated order for the pt's oxygen and her last OV. These items need to be faxed to [phone] attn: Tamara Morales. Order has been placed.  Spoke with pt. She is aware of the above information. Advised her that if she does not hear from [facility] by Friday at lunch time, she needs to contact us. Pt was very appreciative of our help.

## 2017-08-29 NOTE — Telephone Encounter (Signed)
Patient calling for an update and understands we have been trying to reach [facility] with no success for the last 2 days.  She would like to speak to nurse about possibly changing DME's from [facility].  CB is [phone].

## 2017-08-29 NOTE — Addendum Note (Signed)
Addended by: Desmond Dike C on: 08/29/2017 04:44 PM   Modules accepted: Orders

## 2017-08-30 ENCOUNTER — Non-Acute Institutional Stay: Payer: Medicare Other | Admitting: Nurse Practitioner

## 2017-08-30 ENCOUNTER — Other Ambulatory Visit: Payer: Self-pay | Admitting: *Deleted

## 2017-08-30 ENCOUNTER — Encounter: Payer: Self-pay | Admitting: Nurse Practitioner

## 2017-08-30 DIAGNOSIS — E039 Hypothyroidism, unspecified: Secondary | ICD-10-CM

## 2017-08-30 DIAGNOSIS — I890 Lymphedema, not elsewhere classified: Secondary | ICD-10-CM

## 2017-08-30 DIAGNOSIS — J069 Acute upper respiratory infection, unspecified: Secondary | ICD-10-CM

## 2017-08-30 DIAGNOSIS — R05 Cough: Secondary | ICD-10-CM

## 2017-08-30 DIAGNOSIS — R059 Cough, unspecified: Secondary | ICD-10-CM

## 2017-08-30 DIAGNOSIS — F419 Anxiety disorder, unspecified: Secondary | ICD-10-CM | POA: Diagnosis not present

## 2017-08-30 NOTE — Patient Instructions (Signed)
The patient is going to take Mucinex $RemoveBefo'600mg'[id number]$   by mouth twice a day, Claritin $RemoveBefor'10mg'[id number]$  daily by mouth for 5 days. call for worsened cough, congestion, sputum production, fever, chills, sore throat, chest pain, difficulty breathing. May consider antibiotics if indicated.

## 2017-08-30 NOTE — Assessment & Plan Note (Signed)
Her chronic cough stays no change.

## 2017-08-30 NOTE — Progress Notes (Signed)
Location:   clinic [place]   Place of Service:  Clinic (12) Provider: Marlana Latus NP  Code Status: DNR Goals of Care: [place] 07/24/2017  Does Patient Have a Medical Advance Directive? Yes  Type of Advance Directive Living will;Healthcare Power of Attorney  Does patient want to make changes to medical advance directive? Yes (MAU/Ambulatory/Procedural Areas - Information given)  Copy of [place] in Chart? No - copy requested  Would patient like information on creating a medical advance directive? -  Pre-existing out of facility DNR order (yellow form or pink MOST form) -     Chief Complaint  Patient presents with  . Acute Visit    am woke with phelgm in throat, cough x 3 mo, runny nose,     HPI: Patient is a 84 y.o. female seen today for an acute visit for nasal congestion, running nose started this morning, felt some phlegm in throat, but no sputum production. She denied facial pressure/pain, headache, change of vision, sore throat, earaches. She is afebrile, denied chest pain/pressures, palpitation, or SOB. No O2 desaturation. Her chronic cough stays no change. Her chronic edema in BLE, stable on Furosemide $RemoveBefor'40mg'gkCgoegtWVsD$  bid and Metolazone 2.$RemoveBeforeDE'5mg'TWKykwWSgnoFgyp$  qd.   Past Medical History:  Diagnosis Date  . ARTHRITIS, KNEES, BILATERAL   . Breast CA ([place]) 2000   s/p R lumpectomy and XRT  . DEPRESSION   . DJD (degenerative joint disease) of knee   . GERD   . HYPERLIPIDEMIA   . HYPERTENSION   . HYPOTHYROIDISM    postsurgical  . Hypoxia   . MIGRAINE HEADACHE   . Morbid obesity ([place])   . OSA (obstructive sleep apnea) 01/17/2011 dx  . Oxygen dependent   . Urge incontinence   . UTI (lower urinary tract infection) 12/2014    Past Surgical History:  Procedure Laterality Date  . ABDOMINAL HYSTERECTOMY    . ADENOIDECTOMY    . BREAST BIOPSY  2000  . BREAST LUMPECTOMY  2000  . CATARACT EXTRACTION    . CHOLECYSTECTOMY  1995  . CYSTOSCOPY/URETEROSCOPY/HOLMIUM LASER Right  06/28/2015   Procedure: CYSTOSCOPY RIGHT RETROGRAD RIGHT URETEROSCOPY/HOLMIUM LASER WITH RIGHT STENT PLACEMENT;  Surgeon: Alexis Frock, MD;  Location: [facility];  Service: Urology;  Laterality: Right;  . PARATHYROIDECTOMY     2-3 removed  . REPLACEMENT TOTAL KNEE BILATERAL  1999, 2005  . Right leg femur repaired    . THYROIDECTOMY    . TONSILLECTOMY    . TUBAL LIGATION      Allergies  Allergen Reactions  . Azithromycin Itching  . Beta Adrenergic Blockers     Depression   . Ciprofloxacin Other (See Comments)    REACTION: edgy and very jumpy   . Codeine Nausea And Vomiting  . Epinephrine Other (See Comments)    REACTION: rapid pulse, sweats  . Klonopin [Clonazepam] Other (See Comments)    Makes her feel very suicidal   . Oxycodone Other (See Comments)    Patient felt like it altered her mental status "Crazy" . Hallucinations later as well.  [name] Paxil [Paroxetine Hydrochloride] Other (See Comments)    Sever depression   . Prednisone     "makes me feel really bad"  . Propoxyphene Hcl Nausea And Vomiting  . Torsemide   . Tramadol Other (See Comments)    Feels "jittery"  . Vicodin [Hydrocodone-Acetaminophen] Other (See Comments)    Hallucinations  . Meloxicam Diarrhea  . Vancomycin Rash    Localized rash related to  infusion rate.    Allergies as of 08/30/2017      Reactions   Azithromycin Itching   Beta Adrenergic Blockers    Depression   Ciprofloxacin Other (See Comments)   REACTION: edgy and very jumpy    Codeine Nausea And Vomiting   Epinephrine Other (See Comments)   REACTION: rapid pulse, sweats   Klonopin [clonazepam] Other (See Comments)   Makes her feel very suicidal    Oxycodone Other (See Comments)   Patient felt like it altered her mental status "Crazy" . Hallucinations later as well.   Paxil [paroxetine Hydrochloride] Other (See Comments)   Sever depression    Prednisone    "makes me feel really bad"   Propoxyphene Hcl Nausea And Vomiting   Torsemide     Tramadol Other (See Comments)   Feels "jittery"   Vicodin [hydrocodone-acetaminophen] Other (See Comments)   Hallucinations   Meloxicam Diarrhea   Vancomycin Rash   Localized rash related to infusion rate.      Medication List        Accurate as of 08/30/17 11:59 PM. Always use your most recent med list.          acetaminophen 325 MG tablet Commonly known as:  TYLENOL Take 650 mg by mouth every 8 (eight) hours as needed for mild pain or moderate pain.   aspirin 81 MG tablet Take 81 mg by mouth daily.   docusate sodium 100 MG capsule Commonly known as:  COLACE Take 1 capsule (100 mg total) by mouth 2 (two) times daily as needed for mild constipation or moderate constipation.   FIBER-CAPS PO Take 1 capsule by mouth as needed.   furosemide 40 MG tablet Commonly known as:  LASIX Take 1 tablet (40 mg total) by mouth 2 (two) times daily.   hydrochlorothiazide 25 MG tablet Commonly known as:  HYDRODIURIL TAKE 1 TABLET DAILY.   levothyroxine 100 MCG tablet Commonly known as:  SYNTHROID, LEVOTHROID Take 1 tablet (100 mcg total) by mouth daily before breakfast.   loratadine 10 MG tablet Commonly known as:  CLARITIN Take 1 tablet (10 mg total) by mouth daily.   losartan 100 MG tablet Commonly known as:  COZAAR Take 1 tablet (100 mg total) by mouth daily. Hold for SBP less than 110.   metolazone 2.5 MG tablet Commonly known as:  ZAROXOLYN Take 1 tablet (2.5 mg total) by mouth daily.   nystatin cream Commonly known as:  MYCOSTATIN Apply 1 application topically 2 (two) times daily. To right posterior knee skin fold area with redness.   omeprazole 20 MG capsule Commonly known as:  PRILOSEC TAKE (1) CAPSULE DAILY.   ondansetron 4 MG tablet Commonly known as:  ZOFRAN Take 1 tablet (4 mg total) by mouth every 6 (six) hours as needed for nausea.   potassium chloride 20 MEQ packet Commonly known as:  KLOR-CON Take 60 mEq by mouth every morning.   potassium chloride  20 MEQ packet Commonly known as:  KLOR-CON Take 40 mEq by mouth at bedtime.   sertraline 50 MG tablet Commonly known as:  ZOLOFT Take 1 tablet (50 mg total) by mouth daily.       Review of Systems:  Review of Systems  Constitutional: Negative for activity change, appetite change, chills, diaphoresis, fatigue and fever.  HENT: Positive for congestion, hearing loss, postnasal drip and rhinorrhea. Negative for ear pain, facial swelling, mouth sores, nosebleeds, sinus pressure, sinus pain, sneezing, sore throat, trouble swallowing and voice change.  Eyes: Negative for discharge, redness and itching.  Respiratory: Positive for cough. Negative for chest tightness, shortness of breath and wheezing.   Cardiovascular: Positive for leg swelling. Negative for chest pain and palpitations.  Musculoskeletal: Positive for gait problem.  Neurological: Negative for dizziness, facial asymmetry, speech difficulty, weakness, light-headedness and headaches.  Psychiatric/Behavioral: Negative for agitation and behavioral problems.    Health Maintenance  Topic Date Due  . DEXA SCAN  09/07/1997  . TETANUS/TDAP  10/05/2019  . INFLUENZA VACCINE  Completed  . PNA vac Low Risk Adult  Completed    Physical Exam: Vitals:   08/30/17 1304  BP: 122/70  Pulse: 91  Resp: 20  Temp: 98.3 F (36.8 C)  TempSrc: Oral  SpO2: 94%  Weight: 286 lb (129.7 kg)  Height: $Remove'5\' 1"'VNSEyrG$  (1.549 m)   Body mass index is 54.04 kg/m. Physical Exam  Constitutional: She is oriented to person, place, and time. She appears well-developed and well-nourished. No distress.  HENT:  Head: Normocephalic and atraumatic.  Mouth/Throat: Oropharynx is clear and moist.  Eyes: Conjunctivae and EOM are normal. Pupils are equal, round, and reactive to light.  Neck: Normal range of motion. No JVD present.  Cardiovascular: Normal rate and regular rhythm.  Pulmonary/Chest: Effort normal and breath sounds normal. She has no wheezes. She has no  rales.  Musculoskeletal: She exhibits edema.  The patient was seen in electric scooter.   Neurological: She is alert and oriented to person, place, and time. She exhibits normal muscle tone. Coordination normal.  Skin: Skin is warm and dry. She is not diaphoretic.    Labs reviewed: Basic Metabolic Panel: Recent Labs    05/03/17 06/26/17 0655 07/16/17 1109 07/17/17 0152 07/17/17 0825 07/26/17 0745  NA  --  141 139 139  --  141  K 3.4 3.5 2.8* 3.5  --  3.5  CL  --  103 100* 101  --  102  CO2  --  $R'28 31 30  'AZ$ --  30  GLUCOSE  --  107* 125* 128*  --  99  BUN  --  $R'19 17 13  'Nx$ --  18  CREATININE  --  0.89* 1.06* 0.80  --  0.83  CALCIUM  --  8.7 8.2* 8.2*  --  8.3*  MG  --   --  1.5*  --  2.5*  --   TSH 5.72 0.17*  --   --   --  0.29*   Liver Function Tests: Recent Labs    01/12/17 0307  04/03/17  07/16/17 1109 07/17/17 0152 07/26/17 0745  AST 15   < > 16   < > $R'20 18 12  'ba$ ALT 13*   < > 10   < > 13* 13* 10  ALKPHOS 73   < > 86  --  96 82  --   BILITOT 0.7  --   --    < > 0.7 0.6 0.5  PROT 6.5  --   --    < > 6.8 6.1* 6.1  ALBUMIN 3.4*  --   --   --  3.2* 2.9*  --    < > = values in this interval not displayed.   Recent Labs    07/16/17 1109  LIPASE 22   No results for input(s): AMMONIA in the last 8760 hours. CBC: Recent Labs    01/12/17 0307  07/16/17 1109 07/17/17 0152 07/26/17 0745  WBC 6.1   < > 6.9 8.3 7.2  NEUTROABS 4.2  --  5.3  --  5,162  HGB 12.8   < > 12.8 13.0 13.2  HCT 38.5   < > 39.3 38.3 38.5  MCV 89.1   < > 88.5 86.8 84.6  PLT 158   < > 165 143* 159   < > = values in this interval not displayed.   Lipid Panel: Recent Labs    06/26/17 0655  CHOL 171  HDL 45*  LDLCALC 104*  TRIG 128  CHOLHDL 3.8   Lab Results  Component Value Date   HGBA1C 6.3 (H) 06/26/2017    Procedures since last visit: No results found.  Assessment/Plan URI (upper respiratory infection) The patient is going to take Mucinex 600mg   by mouth twice a day, Claritin  10mg  daily by mouth for 5 days. call for worsened cough, congestion, sputum production, fever, chills, sore throat, chest pain, difficulty breathing. May consider antibiotics if indicated.    Cough  Her chronic cough stays no change.   Chronic acquired lymphedema Her chronic edema in BLE, continue Furosemide 40mg  bid and Metolazone 2.5mg  qd.      Labs/tests ordered: none  Next appt:  09/04/2017  Time spend 25 minutes.

## 2017-08-30 NOTE — Assessment & Plan Note (Signed)
Her chronic edema in BLE, continue Furosemide 40mg  bid and Metolazone 2.5mg  qd.

## 2017-08-30 NOTE — Assessment & Plan Note (Signed)
The patient is going to take Mucinex $RemoveBefo'600mg'ptHGVobsEUS$   by mouth twice a day, Claritin $RemoveBefor'10mg'XprsVqXQFqTs$  daily by mouth for 5 days. call for worsened cough, congestion, sputum production, fever, chills, sore throat, chest pain, difficulty breathing. May consider antibiotics if indicated.

## 2017-09-03 DIAGNOSIS — E039 Hypothyroidism, unspecified: Secondary | ICD-10-CM | POA: Diagnosis not present

## 2017-09-04 ENCOUNTER — Non-Acute Institutional Stay: Payer: Medicare Other

## 2017-09-04 DIAGNOSIS — E039 Hypothyroidism, unspecified: Secondary | ICD-10-CM

## 2017-09-05 ENCOUNTER — Encounter: Payer: Self-pay | Admitting: Internal Medicine

## 2017-09-06 LAB — TEST AUTHORIZATION

## 2017-09-06 LAB — T3, FREE: T3, Free: 2.4 pg/mL (ref 2.3–4.2)

## 2017-09-06 LAB — T4, FREE: FREE T4: 1.1 ng/dL (ref 0.8–1.8)

## 2017-09-06 LAB — TSH: TSH: 7.61 mIU/L — ABNORMAL HIGH (ref 0.40–4.50)

## 2017-09-07 ENCOUNTER — Other Ambulatory Visit: Payer: Self-pay

## 2017-09-07 DIAGNOSIS — E039 Hypothyroidism, unspecified: Secondary | ICD-10-CM

## 2017-09-19 ENCOUNTER — Encounter: Payer: Self-pay | Admitting: Nurse Practitioner

## 2017-09-19 NOTE — Progress Notes (Signed)
Location:      Place of Service:    Provider: Marlana Latus NP  Code Status: DNR Goals of Care:  Advanced Directives 07/24/2017  Does Patient Have a Medical Advance Directive? Yes  Type of Advance Directive Living will;Healthcare Power of Attorney  Does patient want to make changes to medical advance directive? Yes (MAU/Ambulatory/Procedural Areas - Information given)  Copy of [place] in Chart? No - copy requested  Would patient like information on creating a medical advance directive? -  Pre-existing out of facility DNR order (yellow form or pink MOST form) -     No chief complaint on file.   HPI: Patient is a 84 y.o. female seen today for an acute visit for  Past Medical History:  Diagnosis Date  . ARTHRITIS, KNEES, BILATERAL   . Breast CA ([place]) 2000   s/p R lumpectomy and XRT  . DEPRESSION   . DJD (degenerative joint disease) of knee   . GERD   . HYPERLIPIDEMIA   . HYPERTENSION   . HYPOTHYROIDISM    postsurgical  . Hypoxia   . MIGRAINE HEADACHE   . Morbid obesity ([place])   . OSA (obstructive sleep apnea) 01/17/2011 dx  . Oxygen dependent   . Urge incontinence   . UTI (lower urinary tract infection) 12/2014    Past Surgical History:  Procedure Laterality Date  . ABDOMINAL HYSTERECTOMY    . ADENOIDECTOMY    . BREAST BIOPSY  2000  . BREAST LUMPECTOMY  2000  . CATARACT EXTRACTION    . CHOLECYSTECTOMY  1995  . CYSTOSCOPY/URETEROSCOPY/HOLMIUM LASER Right 06/28/2015   Procedure: CYSTOSCOPY RIGHT RETROGRAD RIGHT URETEROSCOPY/HOLMIUM LASER WITH RIGHT STENT PLACEMENT;  Surgeon: Alexis Frock, MD;  Location: [facility];  Service: Urology;  Laterality: Right;  . PARATHYROIDECTOMY     2-3 removed  . REPLACEMENT TOTAL KNEE BILATERAL  1999, 2005  . Right leg femur repaired    . THYROIDECTOMY    . TONSILLECTOMY    . TUBAL LIGATION      Allergies  Allergen Reactions  . Azithromycin Itching  . Beta Adrenergic Blockers     Depression   . Ciprofloxacin  Other (See Comments)    REACTION: edgy and very jumpy   . Codeine Nausea And Vomiting  . Epinephrine Other (See Comments)    REACTION: rapid pulse, sweats  . Klonopin [Clonazepam] Other (See Comments)    Makes her feel very suicidal   . Oxycodone Other (See Comments)    Patient felt like it altered her mental status "Crazy" . Hallucinations later as well.  [name] Paxil [Paroxetine Hydrochloride] Other (See Comments)    Sever depression   . Prednisone     "makes me feel really bad"  . Propoxyphene Hcl Nausea And Vomiting  . Torsemide   . Tramadol Other (See Comments)    Feels "jittery"  . Vicodin [Hydrocodone-Acetaminophen] Other (See Comments)    Hallucinations  . Meloxicam Diarrhea  . Vancomycin Rash    Localized rash related to infusion rate.    Allergies as of 09/19/2017      Reactions   Azithromycin Itching   Beta Adrenergic Blockers    Depression   Ciprofloxacin Other (See Comments)   REACTION: edgy and very jumpy    Codeine Nausea And Vomiting   Epinephrine Other (See Comments)   REACTION: rapid pulse, sweats   Klonopin [clonazepam] Other (See Comments)   Makes her feel very suicidal    Oxycodone Other (See Comments)  Patient felt like it altered her mental status "Crazy" . Hallucinations later as well.   Paxil [paroxetine Hydrochloride] Other (See Comments)   Sever depression    Prednisone    "makes me feel really bad"   Propoxyphene Hcl Nausea And Vomiting   Torsemide    Tramadol Other (See Comments)   Feels "jittery"   Vicodin [hydrocodone-acetaminophen] Other (See Comments)   Hallucinations   Meloxicam Diarrhea   Vancomycin Rash   Localized rash related to infusion rate.      Medication List        Accurate as of 09/19/17  4:33 PM. Always use your most recent med list.          acetaminophen 325 MG tablet Commonly known as:  TYLENOL Take 650 mg by mouth every 8 (eight) hours as needed for mild pain or moderate pain.   aspirin 81 MG tablet Take 81 mg  by mouth daily.   docusate sodium 100 MG capsule Commonly known as:  COLACE Take 1 capsule (100 mg total) by mouth 2 (two) times daily as needed for mild constipation or moderate constipation.   FIBER-CAPS PO Take 1 capsule by mouth as needed.   furosemide 40 MG tablet Commonly known as:  LASIX Take 1 tablet (40 mg total) by mouth 2 (two) times daily.   hydrochlorothiazide 25 MG tablet Commonly known as:  HYDRODIURIL TAKE 1 TABLET DAILY.   levothyroxine 100 MCG tablet Commonly known as:  SYNTHROID, LEVOTHROID Take 1 tablet (100 mcg total) by mouth daily before breakfast.   loratadine 10 MG tablet Commonly known as:  CLARITIN Take 1 tablet (10 mg total) by mouth daily.   losartan 100 MG tablet Commonly known as:  COZAAR Take 1 tablet (100 mg total) by mouth daily. Hold for SBP less than 110.   metolazone 2.5 MG tablet Commonly known as:  ZAROXOLYN Take 1 tablet (2.5 mg total) by mouth daily.   nystatin cream Commonly known as:  MYCOSTATIN Apply 1 application topically 2 (two) times daily. To right posterior knee skin fold area with redness.   omeprazole 20 MG capsule Commonly known as:  PRILOSEC TAKE (1) CAPSULE DAILY.   ondansetron 4 MG tablet Commonly known as:  ZOFRAN Take 1 tablet (4 mg total) by mouth every 6 (six) hours as needed for nausea.   potassium chloride 20 MEQ packet Commonly known as:  KLOR-CON Take 60 mEq by mouth every morning.   potassium chloride 20 MEQ packet Commonly known as:  KLOR-CON Take 40 mEq by mouth at bedtime.   sertraline 50 MG tablet Commonly known as:  ZOLOFT Take 1 tablet (50 mg total) by mouth daily.       Review of Systems:  Review of Systems  Health Maintenance  Topic Date Due  . DEXA SCAN  09/07/1997  . INFLUENZA VACCINE  01/17/2018  . TETANUS/TDAP  10/05/2019  . PNA vac Low Risk Adult  Completed    Physical Exam: There were no vitals filed for this visit. There is no height or weight on file to calculate  BMI. Physical Exam  Labs reviewed: Basic Metabolic Panel: Recent Labs    06/26/17 0655 07/16/17 1109 07/17/17 0152 07/17/17 0825 07/26/17 0745 09/03/17 0705  NA 141 139 139  --  141  --   K 3.5 2.8* 3.5  --  3.5  --   CL 103 100* 101  --  102  --   CO2 $Re'28 31 30  'dXG$ --  30  --  GLUCOSE 107* 125* 128*  --  99  --   BUN $Re'19 17 13  'Rjv$ --  18  --   CREATININE 0.89* 1.06* 0.80  --  0.83  --   CALCIUM 8.7 8.2* 8.2*  --  8.3*  --   MG  --  1.5*  --  2.5*  --   --   TSH 0.17*  --   --   --  0.29* 7.61*   Liver Function Tests: Recent Labs    01/12/17 0307  04/03/17  07/16/17 1109 07/17/17 0152 07/26/17 0745  AST 15   < > 16   < > $R'20 18 12  'Ap$ ALT 13*   < > 10   < > 13* 13* 10  ALKPHOS 73   < > 86  --  96 82  --   BILITOT 0.7  --   --    < > 0.7 0.6 0.5  PROT 6.5  --   --    < > 6.8 6.1* 6.1  ALBUMIN 3.4*  --   --   --  3.2* 2.9*  --    < > = values in this interval not displayed.   Recent Labs    07/16/17 1109  LIPASE 22   No results for input(s): AMMONIA in the last 8760 hours. CBC: Recent Labs    01/12/17 0307  07/16/17 1109 07/17/17 0152 07/26/17 0745  WBC 6.1   < > 6.9 8.3 7.2  NEUTROABS 4.2  --  5.3  --  5,162  HGB 12.8   < > 12.8 13.0 13.2  HCT 38.5   < > 39.3 38.3 38.5  MCV 89.1   < > 88.5 86.8 84.6  PLT 158   < > 165 143* 159   < > = values in this interval not displayed.   Lipid Panel: Recent Labs    06/26/17 0655  CHOL 171  HDL 45*  LDLCALC 104*  TRIG 128  CHOLHDL 3.8   Lab Results  Component Value Date   HGBA1C 6.3 (H) 06/26/2017    Procedures since last visit: No results found.  Assessment/Plan No problem-specific Assessment & Plan notes found for this encounter.    Labs/tests ordered:  $RemoveB'@ORDERS'AxqnklWa$ @ Next appt:  10/04/2017  This encounter was created in error - please disregard.

## 2017-09-20 ENCOUNTER — Encounter: Payer: Self-pay | Admitting: Nurse Practitioner

## 2017-09-20 ENCOUNTER — Ambulatory Visit: Payer: Medicare Other | Admitting: Nurse Practitioner

## 2017-09-20 DIAGNOSIS — F418 Other specified anxiety disorders: Secondary | ICD-10-CM

## 2017-09-20 DIAGNOSIS — Z7189 Other specified counseling: Secondary | ICD-10-CM | POA: Diagnosis not present

## 2017-09-20 DIAGNOSIS — F419 Anxiety disorder, unspecified: Secondary | ICD-10-CM | POA: Diagnosis not present

## 2017-09-20 DIAGNOSIS — I10 Essential (primary) hypertension: Secondary | ICD-10-CM | POA: Diagnosis not present

## 2017-09-20 MED ORDER — ALPRAZOLAM 0.5 MG PO TBDP: 0.5000 mg | ORAL_TABLET | Freq: Two times a day (BID) | ORAL | 0 refills | Status: DC | PRN

## 2017-09-20 NOTE — Progress Notes (Signed)
Location:   [facility]   Place of Service:   [facility]  Provider: Marlana Latus NP  Code Status: DNR  Goals of Care: [place] 07/24/2017  Does Patient Have a Medical Advance Directive? Yes  Type of Advance Directive Living will;Healthcare Power of Attorney  Does patient want to make changes to medical advance directive? Yes (MAU/Ambulatory/Procedural Areas - Information given)  Copy of [place] in Chart? No - copy requested  Would patient like information on creating a medical advance directive? -  Pre-existing out of facility DNR order (yellow form or pink MOST form) -     Chief Complaint  Patient presents with  . Medical Management of Chronic Issues    HPI: Patient is a 84 y.o. female seen today for an acute visit for goals of care discussion. She has history of depression, worsened since her 56 year old grandson committed suicide and her son is not coping well. She stated she has difficulty falling and staying asleep, near panic attack sometimes. She was tearful during today's visit.  Hx of HTN, fluctuating in BP, she denied headache, chest pain, palpitation, SOB, or change of vision. On Furosemide $RemoveBefor'40mg'NtCHUCmYKAqr$  bid, Losartan $RemoveBefor'100mg'aBqaOjBmxTzE$  qd, HCT $Remov'25mg'eNtVSa$  qd, Metolazone 2.$RemoveBeforeDE'5mg'qBhLwhFZkWQqlYX$  qd.   Past Medical History:  Diagnosis Date  . ARTHRITIS, KNEES, BILATERAL   . Breast CA ([place]) 2000   s/p R lumpectomy and XRT  . DEPRESSION   . DJD (degenerative joint disease) of knee   . GERD   . HYPERLIPIDEMIA   . HYPERTENSION   . HYPOTHYROIDISM    postsurgical  . Hypoxia   . MIGRAINE HEADACHE   . Morbid obesity ([place])   . OSA (obstructive sleep apnea) 01/17/2011 dx  . Oxygen dependent   . Urge incontinence   . UTI (lower urinary tract infection) 12/2014    Past Surgical History:  Procedure Laterality Date  . ABDOMINAL HYSTERECTOMY    . ADENOIDECTOMY    . BREAST BIOPSY  2000  . BREAST LUMPECTOMY  2000  . CATARACT EXTRACTION    . CHOLECYSTECTOMY  1995  .  CYSTOSCOPY/URETEROSCOPY/HOLMIUM LASER Right 06/28/2015   Procedure: CYSTOSCOPY RIGHT RETROGRAD RIGHT URETEROSCOPY/HOLMIUM LASER WITH RIGHT STENT PLACEMENT;  Surgeon: Alexis Frock, MD;  Location: [facility];  Service: Urology;  Laterality: Right;  . PARATHYROIDECTOMY     2-3 removed  . REPLACEMENT TOTAL KNEE BILATERAL  1999, 2005  . Right leg femur repaired    . THYROIDECTOMY    . TONSILLECTOMY    . TUBAL LIGATION      Allergies  Allergen Reactions  . Azithromycin Itching  . Beta Adrenergic Blockers     Depression   . Ciprofloxacin Other (See Comments)    REACTION: edgy and very jumpy   . Codeine Nausea And Vomiting  . Epinephrine Other (See Comments)    REACTION: rapid pulse, sweats  . Klonopin [Clonazepam] Other (See Comments)    Makes her feel very suicidal   . Oxycodone Other (See Comments)    Patient felt like it altered her mental status "Crazy" . Hallucinations later as well.  [name] Paxil [Paroxetine Hydrochloride] Other (See Comments)    Sever depression   . Prednisone     "makes me feel really bad"  . Propoxyphene Hcl Nausea And Vomiting  . Torsemide   . Tramadol Other (See Comments)    Feels "jittery"  . Vicodin [Hydrocodone-Acetaminophen] Other (See Comments)    Hallucinations  . Meloxicam Diarrhea  . Vancomycin Rash  Localized rash related to infusion rate.    Allergies as of 09/20/2017      Reactions   Azithromycin Itching   Beta Adrenergic Blockers    Depression   Ciprofloxacin Other (See Comments)   REACTION: edgy and very jumpy    Codeine Nausea And Vomiting   Epinephrine Other (See Comments)   REACTION: rapid pulse, sweats   Klonopin [clonazepam] Other (See Comments)   Makes her feel very suicidal    Oxycodone Other (See Comments)   Patient felt like it altered her mental status "Crazy" . Hallucinations later as well.   Paxil [paroxetine Hydrochloride] Other (See Comments)   Sever depression    Prednisone    "makes me feel really bad"   Propoxyphene  Hcl Nausea And Vomiting   Torsemide    Tramadol Other (See Comments)   Feels "jittery"   Vicodin [hydrocodone-acetaminophen] Other (See Comments)   Hallucinations   Meloxicam Diarrhea   Vancomycin Rash   Localized rash related to infusion rate.      Medication List        Accurate as of 09/20/17 11:59 PM. Always use your most recent med list.          acetaminophen 325 MG tablet Commonly known as:  TYLENOL Take 650 mg by mouth every 8 (eight) hours as needed for mild pain or moderate pain.   ALPRAZolam 0.5 MG dissolvable tablet Commonly known as:  NIRAVAM Take 1 tablet (0.5 mg total) by mouth 2 (two) times daily as needed for anxiety.   aspirin 81 MG tablet Take 81 mg by mouth daily.   FIBER-CAPS PO Take 1 capsule by mouth as needed.   furosemide 40 MG tablet Commonly known as:  LASIX Take 1 tablet (40 mg total) by mouth 2 (two) times daily.   hydrochlorothiazide 25 MG tablet Commonly known as:  HYDRODIURIL TAKE 1 TABLET DAILY.   levothyroxine 100 MCG tablet Commonly known as:  SYNTHROID, LEVOTHROID Take 1 tablet (100 mcg total) by mouth daily before breakfast.   loratadine 10 MG tablet Commonly known as:  CLARITIN Take 1 tablet (10 mg total) by mouth daily.   losartan 100 MG tablet Commonly known as:  COZAAR Take 1 tablet (100 mg total) by mouth daily. Hold for SBP less than 110.   metolazone 2.5 MG tablet Commonly known as:  ZAROXOLYN Take 1 tablet (2.5 mg total) by mouth daily.   nystatin cream Commonly known as:  MYCOSTATIN Apply 1 application topically 2 (two) times daily. To right posterior knee skin fold area with redness.   omeprazole 20 MG capsule Commonly known as:  PRILOSEC TAKE (1) CAPSULE DAILY.   ondansetron 4 MG tablet Commonly known as:  ZOFRAN Take 1 tablet (4 mg total) by mouth every 6 (six) hours as needed for nausea.   potassium chloride 20 MEQ packet Commonly known as:  KLOR-CON Take 60 mEq by mouth every morning.   potassium  chloride 20 MEQ packet Commonly known as:  KLOR-CON Take 40 mEq by mouth at bedtime.   sertraline 25 MG tablet Commonly known as:  ZOLOFT Take 25 mg by mouth daily. Take 1 tablet (25 mg) with 1 tablet (50 mg) to equal 75 mg.   sertraline 50 MG tablet Commonly known as:  ZOLOFT Take 1 tablet (50 mg total) by mouth daily.       Review of Systems:  Review of Systems  Constitutional: Negative for activity change, appetite change, chills, diaphoresis, fatigue and fever.  HENT: Positive  for hearing loss.   Respiratory: Negative for cough, chest tightness, shortness of breath and wheezing.   Cardiovascular: Positive for leg swelling. Negative for chest pain and palpitations.  Musculoskeletal: Positive for gait problem.  Skin:       Chronic skin changes in BLE  Neurological: Negative for dizziness, speech difficulty, weakness, numbness and headaches.  Psychiatric/Behavioral: Positive for sleep disturbance. Negative for agitation, behavioral problems, confusion and hallucinations. The patient is nervous/anxious.     Health Maintenance  Topic Date Due  . DEXA SCAN  09/07/1997  . INFLUENZA VACCINE  01/17/2018  . TETANUS/TDAP  10/05/2019  . PNA vac Low Risk Adult  Completed    Physical Exam: Vitals:   09/20/17 1236  BP: 122/64  Pulse: 80  Resp: 20  Temp: 98.1 F (36.7 C)  SpO2: 92%  Weight: 291 lb (132 kg)  Height: $Remove'5\' 1"'oEhPIJf$  (1.549 m)   Body mass index is 54.98 kg/m. Physical Exam  Constitutional: She is oriented to person, place, and time. She appears well-developed and well-nourished.  HENT:  Head: Normocephalic and atraumatic.  Eyes: Pupils are equal, round, and reactive to light. Conjunctivae and EOM are normal.  Neck: Normal range of motion. Neck supple. No JVD present. No thyromegaly present.  Cardiovascular: Normal rate and regular rhythm.  No murmur heard. Pulmonary/Chest: Effort normal and breath sounds normal. She has no wheezes. She has no rales.  Abdominal:  Soft. Bowel sounds are normal.  Musculoskeletal: She exhibits edema.  Chronic lipo dermatosclerosis changes BLE   Neurological: She is alert and oriented to person, place, and time. She exhibits normal muscle tone. Coordination normal.  Skin: Skin is warm and dry.  Psychiatric: Her behavior is normal. Judgment and thought content normal.  Tearful during today's visit.     Labs reviewed: Basic Metabolic Panel: Recent Labs    06/26/17 0655 07/16/17 1109 07/17/17 0152 07/17/17 0825 07/26/17 0745 09/03/17 0705  NA 141 139 139  --  141  --   K 3.5 2.8* 3.5  --  3.5  --   CL 103 100* 101  --  102  --   CO2 $Re'28 31 30  'Zig$ --  30  --   GLUCOSE 107* 125* 128*  --  99  --   BUN $Re'19 17 13  'JRJ$ --  18  --   CREATININE 0.89* 1.06* 0.80  --  0.83  --   CALCIUM 8.7 8.2* 8.2*  --  8.3*  --   MG  --  1.5*  --  2.5*  --   --   TSH 0.17*  --   --   --  0.29* 7.61*   Liver Function Tests: Recent Labs    01/12/17 0307  04/03/17  07/16/17 1109 07/17/17 0152 07/26/17 0745  AST 15   < > 16   < > $R'20 18 12  'MO$ ALT 13*   < > 10   < > 13* 13* 10  ALKPHOS 73   < > 86  --  96 82  --   BILITOT 0.7  --   --    < > 0.7 0.6 0.5  PROT 6.5  --   --    < > 6.8 6.1* 6.1  ALBUMIN 3.4*  --   --   --  3.2* 2.9*  --    < > = values in this interval not displayed.   Recent Labs    07/16/17 1109  LIPASE 22   No results for input(s): AMMONIA  in the last 8760 hours. CBC: Recent Labs    01/12/17 0307  07/16/17 1109 07/17/17 0152 07/26/17 0745  WBC 6.1   < > 6.9 8.3 7.2  NEUTROABS 4.2  --  5.3  --  5,162  HGB 12.8   < > 12.8 13.0 13.2  HCT 38.5   < > 39.3 38.3 38.5  MCV 89.1   < > 88.5 86.8 84.6  PLT 158   < > 165 143* 159   < > = values in this interval not displayed.   Lipid Panel: Recent Labs    06/26/17 0655  CHOL 171  HDL 45*  LDLCALC 104*  TRIG 128  CHOLHDL 3.8   Lab Results  Component Value Date   HGBA1C 6.3 (H) 06/26/2017    Procedures since last visit: No results  found.  Assessment/Plan Depression with anxiety She has history of depression, worsened since her 41 year old grandson committed suicide and her son is not coping well. She stated she has difficulty falling and staying asleep, near panic attack sometimes. She was tearful during today's visit.  Will increase Sertraline to 75mg  po daily, the patient stated she has enough supplies for now. Will provided Alprazolam 0.5mg  bid x #20. Risk, benefit, and alternatives discussed with the patient.   Counseling regarding advanced care planning and goals of care Goals of care discussion: reviewed goals of care with the patient. DNR form reviewed. Went over and filled out MOST for between 12:40pm to 1:00pm. The patient would like to continue to be DNR when she has no pulse and is not breathing. She desires transfer to hospital if indicated with limited additional intervention, avoid intensive care. She agrees antibiotics and IV fluids for a defined trial period. No feeding tube. Form singed by the patient and myself. Copies made for the patient and chart.   Essential hypertension Hx of HTN, fluctuating in BP, she denied headache, chest pain, palpitation, SOB, or change of vision. Continue  Furosemide 40mg  bid, Losartan 100mg  qd, HCT 25mg  qd, Metolazone 2.5mg  qd.     Labs/tests ordered: none  Next appt:  10/04/2017  Time spend 25 minutes.

## 2017-09-20 NOTE — Assessment & Plan Note (Signed)
She has history of depression, worsened since her 82 year old grandson committed suicide and her son is not coping well. She stated she has difficulty falling and staying asleep, near panic attack sometimes. She was tearful during today's visit.  Will increase Sertraline to 75mg  po daily, the patient stated she has enough supplies for now. Will provided Alprazolam 0.5mg  bid x #20. Risk, benefit, and alternatives discussed with the patient.

## 2017-09-20 NOTE — Assessment & Plan Note (Signed)
Hx of HTN, fluctuating in BP, she denied headache, chest pain, palpitation, SOB, or change of vision. Continue  Furosemide $RemoveBef'40mg'IdHsYdLWte$  bid, Losartan $RemoveBefor'100mg'gorZtnVPdDVx$  qd, HCT $Remov'25mg'XxKsje$  qd, Metolazone 2.$RemoveBeforeDE'5mg'VnsHlkfnhkYOxbz$  qd.

## 2017-09-20 NOTE — Assessment & Plan Note (Signed)
Goals of care discussion: reviewed goals of care with the patient. DNR form reviewed. Went over and filled out MOST for between 12:40pm to 1:00pm. The patient would like to continue to be DNR when she has no pulse and is not breathing. She desires transfer to hospital if indicated with limited additional intervention, avoid intensive care. She agrees antibiotics and IV fluids for a defined trial period. No feeding tube. Form singed by the patient and myself. Copies made for the patient and chart.

## 2017-09-26 ENCOUNTER — Encounter: Payer: Self-pay | Admitting: Physician Assistant

## 2017-09-28 ENCOUNTER — Other Ambulatory Visit: Payer: Self-pay

## 2017-09-28 DIAGNOSIS — E890 Postprocedural hypothyroidism: Secondary | ICD-10-CM

## 2017-09-28 DIAGNOSIS — R7303 Prediabetes: Secondary | ICD-10-CM

## 2017-09-28 DIAGNOSIS — E039 Hypothyroidism, unspecified: Secondary | ICD-10-CM

## 2017-10-03 DIAGNOSIS — E890 Postprocedural hypothyroidism: Secondary | ICD-10-CM | POA: Diagnosis not present

## 2017-10-03 DIAGNOSIS — E039 Hypothyroidism, unspecified: Secondary | ICD-10-CM | POA: Diagnosis not present

## 2017-10-03 DIAGNOSIS — R7303 Prediabetes: Secondary | ICD-10-CM | POA: Diagnosis not present

## 2017-10-04 LAB — T4, FREE: FREE T4: 0.9 ng/dL (ref 0.8–1.8)

## 2017-10-04 LAB — TSH: TSH: 23.59 mIU/L — ABNORMAL HIGH (ref 0.40–4.50)

## 2017-10-05 LAB — HEMOGLOBIN A1C
Hgb A1c MFr Bld: 6.4 % of total Hgb — ABNORMAL HIGH (ref <5.7)
Mean Plasma Glucose: 137 (calc)
eAG (mmol/L): 7.6 (calc)

## 2017-10-11 ENCOUNTER — Other Ambulatory Visit: Payer: Self-pay | Admitting: *Deleted

## 2017-10-11 ENCOUNTER — Encounter: Payer: Self-pay | Admitting: Internal Medicine

## 2017-10-11 DIAGNOSIS — E039 Hypothyroidism, unspecified: Secondary | ICD-10-CM

## 2017-10-11 MED ORDER — LEVOTHYROXINE SODIUM 125 MCG PO TABS: 125.0000 ug | ORAL_TABLET | Freq: Every day | ORAL | 3 refills | Status: DC

## 2017-10-12 ENCOUNTER — Other Ambulatory Visit: Payer: Self-pay | Admitting: Internal Medicine

## 2017-10-12 ENCOUNTER — Other Ambulatory Visit: Payer: Self-pay | Admitting: Nurse Practitioner

## 2017-10-16 ENCOUNTER — Encounter: Payer: Self-pay | Admitting: Internal Medicine

## 2017-10-16 ENCOUNTER — Ambulatory Visit: Payer: Medicare Other | Admitting: Internal Medicine

## 2017-10-16 VITALS — BP 126/70 | HR 96 | Temp 98.4°F | Resp 16 | Ht 61.0 in | Wt 289.0 lb

## 2017-10-16 DIAGNOSIS — E039 Hypothyroidism, unspecified: Secondary | ICD-10-CM

## 2017-10-16 DIAGNOSIS — R05 Cough: Secondary | ICD-10-CM | POA: Diagnosis not present

## 2017-10-16 DIAGNOSIS — R7303 Prediabetes: Secondary | ICD-10-CM | POA: Diagnosis not present

## 2017-10-16 DIAGNOSIS — R491 Aphonia: Secondary | ICD-10-CM | POA: Diagnosis not present

## 2017-10-16 DIAGNOSIS — R058 Other specified cough: Secondary | ICD-10-CM

## 2017-10-16 MED ORDER — DM-GUAIFENESIN ER 30-600 MG PO TB12: 1.0000 | ORAL_TABLET | Freq: Two times a day (BID) | ORAL | 0 refills | Status: DC

## 2017-10-16 NOTE — Patient Instructions (Signed)
Take claritin every day for now and mucinex dm twice a day for your cough. Keep a cough diary to see when do you cough the most, what makes the cough worse and what makes it better. Does cough interrupt your sleep? Do you chest hurt with coughing? Are you wheezing with cough?  Take levothyroxine new dosing of 125 mcg daily in morning empty stomach for now.

## 2017-10-16 NOTE — Progress Notes (Signed)
[facility]  Provider: Blanchie Serve MD   Location:      Place of Service:     PCP: Blanchie Serve, MD Patient Care Team: Blanchie Serve, MD as PCP - General (Internal Medicine) Azucena Fallen, MD as Consulting Physician (Obstetrics and Gynecology) Chesley Mires, MD as Consulting Physician (Pulmonary Disease) Mast, Man X, NP as Nurse Practitioner (Internal Medicine) Christin Fudge, MD as Consulting Physician (Surgery)  Extended Emergency Contact Information Primary Emergency Contact: Boxman,Lee Address: [address]          [place], [place] [address] Johnnette Litter of [place] Phone: [phone] Mobile Phone: [phone] Relation: Daughter Secondary Emergency Contact: Ritta Slot [place] of [place] Mobile Phone: [phone] Relation: Daughter   Goals of Care: Advanced Directive information Advanced Directives 07/24/2017  Does Patient Have a Medical Advance Directive? Yes  Type of Advance Directive Living will;Healthcare Power of Attorney  Does patient want to make changes to medical advance directive? Yes (MAU/Ambulatory/Procedural Areas - Information given)  Copy of [place] in Chart? No - copy requested  Would patient like information on creating a medical advance directive? -  Pre-existing out of facility DNR order (yellow form or pink MOST form) -      Chief Complaint  Patient presents with  . Acute Visit    lab concerns, cough, loss of voice  . Medication Refill    No refills needed at thsi time    HPI: Patient is a 84 y.o. female seen today for acute visit.   Abnormal TSH- has history of hypothyroidism, has been on levothyroxine for some time now. TSH 23.59 from 7.61.   Loss of voice- she lost her voice Saturday.this was preceded by cough going on for a month, dry cough and has post nasal drip. No sore throat. No fever or chills. Denies muscle aches. Took claritin and zyrtec and mucinex last night with some  help.   Cough- ongoing for a month, dry cough and has ost nasal drip, denies dyspnea  Prediabetes- a1c reviewed. Tries to eat healthy, does not care for sweets   Past Medical History:  Diagnosis Date  . ARTHRITIS, KNEES, BILATERAL   . Breast CA ([facility]) 2000   s/p R lumpectomy and XRT  . DEPRESSION   . DJD (degenerative joint disease) of knee   . GERD   . HYPERLIPIDEMIA   . HYPERTENSION   . HYPOTHYROIDISM    postsurgical  . Hypoxia   . MIGRAINE HEADACHE   . Morbid obesity ([place])   . OSA (obstructive sleep apnea) 01/17/2011 dx  . Oxygen dependent   . Urge incontinence   . UTI (lower urinary tract infection) 12/2014   Past Surgical History:  Procedure Laterality Date  . ABDOMINAL HYSTERECTOMY    . ADENOIDECTOMY    . BREAST BIOPSY  2000  . BREAST LUMPECTOMY  2000  . CATARACT EXTRACTION    . CHOLECYSTECTOMY  1995  . CYSTOSCOPY/URETEROSCOPY/HOLMIUM LASER Right 06/28/2015   Procedure: CYSTOSCOPY RIGHT RETROGRAD RIGHT URETEROSCOPY/HOLMIUM LASER WITH RIGHT STENT PLACEMENT;  Surgeon: Alexis Frock, MD;  Location: [facility];  Service: Urology;  Laterality: Right;  . PARATHYROIDECTOMY     2-3 removed  . REPLACEMENT TOTAL KNEE BILATERAL  1999, 2005  . Right leg femur repaired    . THYROIDECTOMY    . TONSILLECTOMY    . TUBAL LIGATION      reports that she quit smoking about 43 years ago. Her smoking use included cigarettes. She has  a 0.50 pack-year smoking history. She has never used smokeless tobacco. She reports that she does not drink alcohol or use drugs. Social History   Socioeconomic History  . Marital status: Married    Spouse name: Not on file  . Number of children: 5  . Years of education: Not on file  . Highest education level: Not on file  Occupational History  . Occupation: RETIRED    Employer: RETIRED    Comment: worked on Hotel manager  . Financial resource strain: Not hard at all  . Food insecurity:    Worry: Never true    Inability: Never true  .  Transportation needs:    Medical: No    Non-medical: No  Tobacco Use  . Smoking status: Former Smoker    Packs/day: 0.25    Years: 2.00    Pack years: 0.50    Types: Cigarettes    Last attempt to quit: 06/19/1974    Years since quitting: 43.3  . Smokeless tobacco: Never Used  . Tobacco comment: Quit in late 20's  Substance and Sexual Activity  . Alcohol use: No    Alcohol/week: 0.0 oz    Comment: rare  . Drug use: No  . Sexual activity: Never  Lifestyle  . Physical activity:    Days per week: 0 days    Minutes per session: 0 min  . Stress: Only a little  Relationships  . Social connections:    Talks on phone: More than three times a week    Gets together: Once a week    Attends religious service: More than 4 times per year    Active member of club or organization: Yes    Attends meetings of clubs or organizations: 1 to 4 times per year    Relationship status: Married  . Intimate partner violence:    Fear of current or ex partner: No    Emotionally abused: No    Physically abused: No    Forced sexual activity: No  Other Topics Concern  . Not on file  Social History Narrative   Married to husband that has dementia (he lives in the skilled nursing wing) and this is a big stressor. 5 children. 10 grandchildren. 2 greatgrandchildren. All children [place]      Lives at [facility], in independent living.      Retired from multiple different Community education officer, university, Psychologist, educational.       Hobbies: Management consultant, write poetry, craft dolls     Family History  Problem Relation Age of Onset  . Emphysema Sister   . Coronary artery disease Neg Hx     Health Maintenance  Topic Date Due  . DEXA SCAN  09/07/1997  . INFLUENZA VACCINE  01/17/2018  . TETANUS/TDAP  10/05/2019  . PNA vac Low Risk Adult  Completed    Allergies  Allergen Reactions  . Azithromycin Itching  . Beta Adrenergic Blockers     Depression   . Ciprofloxacin Other (See Comments)    REACTION: edgy  and very jumpy   . Codeine Nausea And Vomiting  . Epinephrine Other (See Comments)    REACTION: rapid pulse, sweats  . Klonopin [Clonazepam] Other (See Comments)    Makes her feel very suicidal   . Oxycodone Other (See Comments)    Patient felt like it altered her mental status "Crazy" . Hallucinations later as well.  [name] Paxil [Paroxetine Hydrochloride] Other (See Comments)    Sever depression   .  Prednisone     "makes me feel really bad"  . Propoxyphene Hcl Nausea And Vomiting  . Torsemide   . Tramadol Other (See Comments)    Feels "jittery"  . Vicodin [Hydrocodone-Acetaminophen] Other (See Comments)    Hallucinations  . Meloxicam Diarrhea  . Vancomycin Rash    Localized rash related to infusion rate.    Outpatient Encounter Medications as of 10/16/2017  Medication Sig  . acetaminophen (TYLENOL) 325 MG tablet Take 650 mg by mouth every 8 (eight) hours as needed for mild pain or moderate pain.   [name] ALPRAZolam (NIRAVAM) 0.5 MG dissolvable tablet Take 1 tablet (0.5 mg total) by mouth 2 (two) times daily as needed for anxiety.  [name] aspirin 81 MG tablet Take 81 mg by mouth daily.   . Calcium Polycarbophil (FIBER-CAPS PO) Take 1 capsule by mouth as needed.  . furosemide (LASIX) 40 MG tablet Take 1 tablet (40 mg total) by mouth 2 (two) times daily.  . hydrochlorothiazide (HYDRODIURIL) 25 MG tablet TAKE 1 TABLET EACH DAY.  [name] levothyroxine (SYNTHROID, LEVOTHROID) 125 MCG tablet Take 1 tablet (125 mcg total) by mouth daily.  [name] loratadine (CLARITIN) 10 MG tablet Take 1 tablet (10 mg total) by mouth daily.  [name] losartan (COZAAR) 100 MG tablet TAKE 1 TABLET ONCE DAILY. HOLD FOR SYSTOLIC BLOOD PRESSURE LESS THAN 110.  . metolazone (ZAROXOLYN) 2.5 MG tablet Take 1 tablet (2.5 mg total) by mouth daily.  [name] omeprazole (PRILOSEC) 20 MG capsule TAKE (1) CAPSULE DAILY.  [name] ondansetron (ZOFRAN) 4 MG tablet Take 1 tablet (4 mg total) by mouth every 6 (six) hours as needed for nausea.  . potassium chloride  (KLOR-CON) 20 MEQ packet Take 60 mEq by mouth every morning.  . potassium chloride (KLOR-CON) 20 MEQ packet Take 40 mEq by mouth at bedtime.  . sertraline (ZOLOFT) 25 MG tablet Take 25 mg by mouth daily. Take 1 tablet (25 mg) with 1 tablet (50 mg) to equal 75 mg.  . sertraline (ZOLOFT) 50 MG tablet Take 1 tablet (50 mg total) by mouth daily.  . [DISCONTINUED] metolazone (ZAROXOLYN) 2.5 MG tablet TAKE 1 TABLET EACH DAY.  . [DISCONTINUED] nystatin cream (MYCOSTATIN) Apply 1 application topically 2 (two) times daily. To right posterior knee skin fold area with redness. (Patient not taking: Reported on 10/16/2017)   No facility-administered encounter medications on file as of 10/16/2017.     Review of Systems  Constitutional: Negative for appetite change, chills and fever.  HENT: Positive for postnasal drip and voice change. Negative for congestion, ear pain, mouth sores, rhinorrhea, sinus pressure, sinus pain, sore throat and trouble swallowing.        Feels like there is water in right ear  Respiratory: Negative for chest tightness and shortness of breath.   Cardiovascular: Positive for leg swelling. Negative for chest pain and palpitations.  Gastrointestinal: Negative for abdominal pain, constipation, diarrhea, nausea and vomiting.  Skin: Negative for rash.  Neurological: Negative for dizziness and headaches.  Psychiatric/Behavioral: Negative for behavioral problems and sleep disturbance. The patient is not nervous/anxious.     Vitals:   10/16/17 0829  BP: 126/70  Pulse: 96  Resp: 16  Temp: 98.4 F (36.9 C)  TempSrc: Oral  SpO2: 91%  Weight: 289 lb (131.1 kg)  Height: $Remove'5\' 1"'BjfFRbH$  (1.549 m)   Body mass index is 54.61 kg/m.   Wt Readings from Last 3 Encounters:  10/16/17 289 lb (131.1 kg)  09/20/17 291 lb (132 kg)  08/30/17 286 lb (129.7  kg)   Physical Exam  Constitutional: She is oriented to person, place, and time.  Morbidly obese, elderly female in no acute distress  HENT:    Head: Normocephalic and atraumatic.  Left Ear: External ear normal.  Mouth/Throat: No oropharyngeal exudate.  Some wax to right ear canal, can visualize tympanic membrane, mild oropharyngeal erythema, yellow mucus to nostrils and erythema of nasal mucosa, no sinus tenderness  Eyes: Pupils are equal, round, and reactive to light. EOM are normal. Right eye exhibits no discharge. Left eye exhibits no discharge.  Neck: Normal range of motion. Neck supple.  Cardiovascular: Normal rate and regular rhythm.  Pulmonary/Chest: Effort normal and breath sounds normal. No respiratory distress. She has no wheezes. She has no rales. She exhibits no tenderness.  Abdominal: Soft. Bowel sounds are normal.  Musculoskeletal:  Chronic lymphedema, no worsening of leg edema  Lymphadenopathy:    She has no cervical adenopathy.  Neurological: She is alert and oriented to person, place, and time.  Skin: Skin is warm and dry. She is not diaphoretic.  Psychiatric: She has a normal mood and affect.    Labs reviewed: Basic Metabolic Panel: Recent Labs    07/16/17 1109 07/17/17 0152 07/17/17 0825 07/26/17 0745  NA 139 139  --  141  K 2.8* 3.5  --  3.5  CL 100* 101  --  102  CO2 31 30  --  30  GLUCOSE 125* 128*  --  99  BUN 17 13  --  18  CREATININE 1.06* 0.80  --  0.83  CALCIUM 8.2* 8.2*  --  8.3*  MG 1.5*  --  2.5*  --    Liver Function Tests: Recent Labs    01/12/17 0307  04/03/17  07/16/17 1109 07/17/17 0152 07/26/17 0745  AST 15   < > 16   < > $R'20 18 12  'Yn$ ALT 13*   < > 10   < > 13* 13* 10  ALKPHOS 73   < > 86  --  96 82  --   BILITOT 0.7  --   --    < > 0.7 0.6 0.5  PROT 6.5  --   --    < > 6.8 6.1* 6.1  ALBUMIN 3.4*  --   --   --  3.2* 2.9*  --    < > = values in this interval not displayed.   Recent Labs    07/16/17 1109  LIPASE 22   No results for input(s): AMMONIA in the last 8760 hours. CBC: Recent Labs    01/12/17 0307  07/16/17 1109 07/17/17 0152 07/26/17 0745  WBC 6.1   <  > 6.9 8.3 7.2  NEUTROABS 4.2  --  5.3  --  5,162  HGB 12.8   < > 12.8 13.0 13.2  HCT 38.5   < > 39.3 38.3 38.5  MCV 89.1   < > 88.5 86.8 84.6  PLT 158   < > 165 143* 159   < > = values in this interval not displayed.   Cardiac Enzymes: Recent Labs    07/16/17 2030 07/17/17 0152 07/17/17 0825  TROPONINI <0.03 <0.03 <0.03   BNP: Invalid input(s): POCBNP Lab Results  Component Value Date   HGBA1C 6.4 (H) 10/03/2017   Lab Results  Component Value Date   TSH 23.59 (H) 10/03/2017   No results found for: VITAMINB12 No results found for: FOLATE No results found for: IRON, TIBC, FERRITIN  Lipid Panel: Recent Labs  06/26/17 0655  CHOL 171  HDL 45*  LDLCALC 104*  TRIG 128  CHOLHDL 3.8   Lab Results  Component Value Date   HGBA1C 6.4 (H) 10/03/2017    Procedures since last visit: No results found.  Assessment/Plan  1. Cough present for greater than 3 weeks Appears to be upper airway reactive disease. Start mucinex dm bid and loratadine 10 mg daily.   2. Hypothyroidism (acquired) Lab Results  Component Value Date   TSH 23.59 (H) 10/03/2017   Change levothyroxine to 125 mcg daily and monitor. Recheck TSH in 8-10 weeks.   3. Loss of voice No sore throat. Her upper airway reactive disease could be contributing to this. Voice rest. Salt water gargle as tolerated.   4. Prediabetes Reviewed a1c. Cut down on sweets and fried food.     Labs/tests ordered: TSH Next appointment: 3 weeks or earlier if needed  Communication: reviewed care plan with patient    Blanchie Serve, MD Internal Medicine [place], [place] [address] Cell Phone (Monday-Friday 8 am - 5 pm): [phone] On Call: [phone] and follow prompts after 5 pm and on weekends Office Phone: [phone] Office Fax: [phone]

## 2017-10-19 ENCOUNTER — Other Ambulatory Visit: Payer: Self-pay | Admitting: Nurse Practitioner

## 2017-10-23 ENCOUNTER — Encounter: Payer: Self-pay | Admitting: Internal Medicine

## 2017-10-30 ENCOUNTER — Other Ambulatory Visit: Payer: Self-pay

## 2017-11-06 ENCOUNTER — Encounter: Payer: Self-pay | Admitting: Internal Medicine

## 2017-11-14 ENCOUNTER — Other Ambulatory Visit: Payer: Self-pay | Admitting: Nurse Practitioner

## 2017-11-14 DIAGNOSIS — K219 Gastro-esophageal reflux disease without esophagitis: Secondary | ICD-10-CM

## 2017-11-14 NOTE — Telephone Encounter (Signed)
Patient's Xanax was checked and only uses 1 pharmacy. Last time filled was in April 2019

## 2017-11-20 ENCOUNTER — Non-Acute Institutional Stay: Payer: Medicare Other | Admitting: Internal Medicine

## 2017-11-20 ENCOUNTER — Encounter: Payer: Self-pay | Admitting: Internal Medicine

## 2017-11-20 VITALS — BP 132/62 | HR 92 | Temp 97.4°F | Resp 16 | Ht 61.0 in | Wt 302.6 lb

## 2017-11-20 DIAGNOSIS — I890 Lymphedema, not elsewhere classified: Secondary | ICD-10-CM

## 2017-11-20 DIAGNOSIS — R635 Abnormal weight gain: Secondary | ICD-10-CM | POA: Diagnosis not present

## 2017-11-20 DIAGNOSIS — R05 Cough: Secondary | ICD-10-CM

## 2017-11-20 DIAGNOSIS — R058 Other specified cough: Secondary | ICD-10-CM

## 2017-11-20 DIAGNOSIS — I10 Essential (primary) hypertension: Secondary | ICD-10-CM

## 2017-11-20 DIAGNOSIS — R7303 Prediabetes: Secondary | ICD-10-CM | POA: Diagnosis not present

## 2017-11-20 DIAGNOSIS — E039 Hypothyroidism, unspecified: Secondary | ICD-10-CM | POA: Diagnosis not present

## 2017-11-20 MED ORDER — FUROSEMIDE 40 MG PO TABS: 60.0000 mg | ORAL_TABLET | Freq: Every day | ORAL | 3 refills | Status: DC

## 2017-11-20 MED ORDER — LORATADINE 10 MG PO TABS: 10.0000 mg | ORAL_TABLET | Freq: Every day | ORAL | 0 refills | Status: DC

## 2017-11-20 MED ORDER — ONDANSETRON HCL 4 MG PO TABS: 4.0000 mg | ORAL_TABLET | Freq: Four times a day (QID) | ORAL | 0 refills | Status: DC | PRN

## 2017-11-20 MED ORDER — DM-GUAIFENESIN ER 30-600 MG PO TB12: 1.0000 | ORAL_TABLET | Freq: Two times a day (BID) | ORAL | 0 refills | Status: DC | PRN

## 2017-11-20 NOTE — Patient Instructions (Signed)
  Take lasix 60 mg daily for now along with your metolazone and potassium supplement.   Take claritin daily for now for your cough

## 2017-11-20 NOTE — Progress Notes (Signed)
Location:  [facility] of Service:  Clinic (12) Provider:  Blanchie Serve MD  Blanchie Serve, MD  Patient Care Team: Blanchie Serve, MD as PCP - General (Internal Medicine) Azucena Fallen, MD as Consulting Physician (Obstetrics and Gynecology) Chesley Mires, MD as Consulting Physician (Pulmonary Disease) Mast, Man X, NP as Nurse Practitioner (Internal Medicine) Christin Fudge, MD as Consulting Physician (Surgery)  Extended Emergency Contact Information Primary Emergency Contact: Boxman,Lee Address: [address]          [place], [place] [address] Johnnette Litter of [place] Phone: [phone] Mobile Phone: [phone] Relation: Daughter Secondary Emergency Contact: Ritta Slot [place] of [place] Mobile Phone: [phone] Relation: Daughter  Goals of care: Advanced Directive information Advanced Directives 11/20/2017  Does Patient Have a Medical Advance Directive? Yes  Type of Paramedic of [place];Living will;Out of facility DNR (pink MOST or yellow form)  Does patient want to make changes to medical advance directive? No - Patient declined  Copy of [place] in Chart? Yes  Would patient like information on creating a medical advance directive? -  Pre-existing out of facility DNR order (yellow form or pink MOST form) Yellow form placed in chart (order not valid for inpatient use);Pink MOST form placed in chart (order not valid for inpatient use)     Chief Complaint  Patient presents with  . Acute Visit    Follow up for cough. Patient mentioned that she is feeling much better, she still has some moments of coughing but not as much. She has also brought her BP readings.   . Medication Refill    Zofran (pending)     HPI:  Pt is a 84 y.o. female seen today for follow up visit.   Cough- thought to be from upper airway reactive disease. Currently on mucinex dm bid and loratadine 10 mg daily but taking them  only as needed. Has mild cough and some itchy eyes. Cough overall is much improved. Voice restored.   Weight gain- has gained about 12 lbs. Has hypothyroidism and has also reduced lasix dosing to help avoid urinary frequency.   Hypertension- currently on hctz 25 mg daily and losartan 100 mg daily. Also on lasix 40 mg daily and metolazone 2.5 mg daily. Not taking lasix 40 mg bid because of increased urinary frequency. All SBP are below 140s.   Hypothyroidism- with elevated TSH, levothyroxine was increased to 125 mcg daily, taking it. Tolerating well. Energy level fair. Appetite is ok.     Past Medical History:  Diagnosis Date  . ARTHRITIS, KNEES, BILATERAL   . Breast CA ([place]) 2000   s/p R lumpectomy and XRT  . DEPRESSION   . DJD (degenerative joint disease) of knee   . GERD   . HYPERLIPIDEMIA   . HYPERTENSION   . HYPOTHYROIDISM    postsurgical  . Hypoxia   . MIGRAINE HEADACHE   . Morbid obesity ([facility])   . OSA (obstructive sleep apnea) 01/17/2011 dx  . Oxygen dependent   . Urge incontinence   . UTI (lower urinary tract infection) 12/2014   Past Surgical History:  Procedure Laterality Date  . ABDOMINAL HYSTERECTOMY    . ADENOIDECTOMY    . BREAST BIOPSY  2000  . BREAST LUMPECTOMY  2000  . CATARACT EXTRACTION    . CHOLECYSTECTOMY  1995  . CYSTOSCOPY/URETEROSCOPY/HOLMIUM LASER Right 06/28/2015   Procedure: CYSTOSCOPY RIGHT RETROGRAD RIGHT URETEROSCOPY/HOLMIUM LASER WITH RIGHT STENT PLACEMENT;  Surgeon: Alexis Frock, MD;  Location: [facility];  Service: Urology;  Laterality: Right;  . PARATHYROIDECTOMY     2-3 removed  . REPLACEMENT TOTAL KNEE BILATERAL  1999, 2005  . Right leg femur repaired    . THYROIDECTOMY    . TONSILLECTOMY    . TUBAL LIGATION      Allergies  Allergen Reactions  . Azithromycin Itching  . Beta Adrenergic Blockers     Depression   . Ciprofloxacin Other (See Comments)    REACTION: edgy and very jumpy   . Codeine Nausea And Vomiting  . Epinephrine Other  (See Comments)    REACTION: rapid pulse, sweats  . Klonopin [Clonazepam] Other (See Comments)    Makes her feel very suicidal   . Oxycodone Other (See Comments)    Patient felt like it altered her mental status "Crazy" . Hallucinations later as well.  [name] Paxil [Paroxetine Hydrochloride] Other (See Comments)    Sever depression   . Prednisone     "makes me feel really bad"  . Propoxyphene Hcl Nausea And Vomiting  . Torsemide   . Tramadol Other (See Comments)    Feels "jittery"  . Vicodin [Hydrocodone-Acetaminophen] Other (See Comments)    Hallucinations  . Meloxicam Diarrhea  . Vancomycin Rash    Localized rash related to infusion rate.    Outpatient Encounter Medications as of 11/20/2017  Medication Sig  . acetaminophen (TYLENOL) 325 MG tablet Take 650 mg by mouth every 8 (eight) hours as needed for mild pain or moderate pain.   [name] ALPRAZolam (XANAX) 0.5 MG tablet TAKE 1 TABLET TWICE DAILY AS NEEDED FOR ANXIETY.  [name] aspirin 81 MG tablet Take 81 mg by mouth daily.   . Calcium Polycarbophil (FIBER-CAPS PO) Take 1 capsule by mouth as needed.  . hydrochlorothiazide (HYDRODIURIL) 25 MG tablet TAKE 1 TABLET EACH DAY.  [name] levothyroxine (SYNTHROID, LEVOTHROID) 125 MCG tablet Take 1 tablet (125 mcg total) by mouth daily.  [name] losartan (COZAAR) 100 MG tablet TAKE 1 TABLET ONCE DAILY. HOLD FOR SYSTOLIC BLOOD PRESSURE LESS THAN 110.  . metolazone (ZAROXOLYN) 2.5 MG tablet Take 1 tablet (2.5 mg total) by mouth daily.  [name] omeprazole (PRILOSEC) 20 MG capsule TAKE (1) CAPSULE DAILY.  [name] ondansetron (ZOFRAN) 4 MG tablet Take 1 tablet (4 mg total) by mouth every 6 (six) hours as needed for nausea.  . potassium chloride (KLOR-CON) 20 MEQ packet Take 60 mEq by mouth every morning.  . potassium chloride (KLOR-CON) 20 MEQ packet Take 40 mEq by mouth at bedtime.  . sertraline (ZOLOFT) 25 MG tablet Take 25 mg by mouth daily. Take 1 tablet (25 mg) with 1 tablet (50 mg) to equal 75 mg.  . sertraline (ZOLOFT) 50 MG  tablet Take 1 tablet (50 mg total) by mouth daily.  [name] dextromethorphan-guaiFENesin (MUCINEX DM) 30-600 MG 12hr tablet Take 1 tablet by mouth 2 (two) times daily. (Patient not taking: Reported on 11/20/2017)  . furosemide (LASIX) 40 MG tablet Take 1 tablet (40 mg total) by mouth 2 (two) times daily. (Patient not taking: Reported on 11/20/2017)  . loratadine (CLARITIN) 10 MG tablet Take 1 tablet (10 mg total) by mouth daily. (Patient not taking: Reported on 11/20/2017)  . [DISCONTINUED] ALPRAZolam (NIRAVAM) 0.5 MG dissolvable tablet Take 1 tablet (0.5 mg total) by mouth 2 (two) times daily as needed for anxiety.  . [DISCONTINUED] potassium chloride SA (K-DUR,KLOR-CON) 20 MEQ tablet TAKE 3 TABLETS AT BREAKFAST AND 2 TABLETS AT 6PM.   No facility-administered encounter medications on file as  of 11/20/2017.     Review of Systems  Constitutional: Negative for appetite change, chills, fatigue and fever.  HENT: Positive for congestion and postnasal drip.   Respiratory: Positive for cough and shortness of breath. Negative for wheezing.   Cardiovascular: Positive for leg swelling. Negative for chest pain and palpitations.  Gastrointestinal: Negative for abdominal pain, constipation, diarrhea, nausea and vomiting.  Genitourinary: Positive for frequency. Negative for dysuria.  Musculoskeletal: Positive for arthralgias and gait problem. Negative for back pain.  Skin: Negative for rash.  Neurological: Negative for dizziness, numbness and headaches.  Psychiatric/Behavioral: Negative for confusion.    Immunization History  Administered Date(s) Administered  . Influenza Split 03/20/2011  . Influenza Whole 03/19/2009, 03/19/2012  . Influenza,inj,Quad PF,6+ Mos 03/19/2013  . Influenza-Unspecified 03/03/2014, 03/15/2015, 03/30/2016, 04/05/2017  . Meningococcal Polysaccharide 03/15/2015  . PPD Test 03/30/2016  . Pneumococcal Conjugate-13 03/12/2014  . Pneumococcal Polysaccharide-23 06/19/2006, 03/15/2015  . Td  10/04/2009  . Tdap 06/19/2005  . Zoster 06/19/2010   Pertinent  Health Maintenance Due  Topic Date Due  . DEXA SCAN  09/07/1997  . INFLUENZA VACCINE  01/17/2018  . PNA vac Low Risk Adult  Completed   Fall Risk  08/30/2017 06/07/2017 12/22/2016 04/06/2016 11/27/2015  Falls in the past year? No No Yes Yes No  Number falls in past yr: - - 1 1 -  Injury with Fall? - - No Yes -  Risk for fall due to : - - - - -   Functional Status Survey:    Vitals:   11/20/17 1132  BP: 132/62  Pulse: 92  Resp: 16  Temp: (!) 97.4 F (36.3 C)  TempSrc: Oral  SpO2: 93%  Weight: (!) 302 lb 9.6 oz (137.3 kg)  Height: '5\' 1"'  (1.549 m)   Body mass index is 57.18 kg/m.   Wt Readings from Last 3 Encounters:  11/20/17 (!) 302 lb 9.6 oz (137.3 kg)  10/16/17 289 lb (131.1 kg)  09/20/17 291 lb (132 kg)   Physical Exam  Constitutional: She is oriented to person, place, and time.  Morbidly obese, in no acute distress  HENT:  Head: Normocephalic and atraumatic.  Mouth/Throat: Oropharynx is clear and moist. No oropharyngeal exudate.  Eyes: Pupils are equal, round, and reactive to light. Right eye exhibits no discharge. Left eye exhibits no discharge.  Neck: Normal range of motion. Neck supple.  Cardiovascular: Normal rate and regular rhythm.  Pulmonary/Chest: Effort normal. She has rales.  Poor air movement  Abdominal: Soft. Bowel sounds are normal. There is no tenderness.  Musculoskeletal: She exhibits edema.  Able to move all 4 extremities, unsteady gait, uses motorized wheelchair  Neurological: She is alert and oriented to person, place, and time. She exhibits normal muscle tone.  Skin: Skin is warm and dry. She is not diaphoretic.  Psychiatric: She has a normal mood and affect.    Labs reviewed: Recent Labs    07/16/17 1109 07/17/17 0152 07/17/17 0825 07/26/17 0745  NA 139 139  --  141  K 2.8* 3.5  --  3.5  CL 100* 101  --  102  CO2 31 30  --  30  GLUCOSE 125* 128*  --  99  BUN 17 13   --  18  CREATININE 1.06* 0.80  --  0.83  CALCIUM 8.2* 8.2*  --  8.3*  MG 1.5*  --  2.5*  --    Recent Labs    01/12/17 0307  04/03/17  07/16/17 1109 07/17/17 0152 07/26/17  0745  AST 15   < > 16   < > '20 18 12  ' ALT 13*   < > 10   < > 13* 13* 10  ALKPHOS 73   < > 86  --  96 82  --   BILITOT 0.7  --   --    < > 0.7 0.6 0.5  PROT 6.5  --   --    < > 6.8 6.1* 6.1  ALBUMIN 3.4*  --   --   --  3.2* 2.9*  --    < > = values in this interval not displayed.   Recent Labs    01/12/17 0307  07/16/17 1109 07/17/17 0152 07/26/17 0745  WBC 6.1   < > 6.9 8.3 7.2  NEUTROABS 4.2  --  5.3  --  5,162  HGB 12.8   < > 12.8 13.0 13.2  HCT 38.5   < > 39.3 38.3 38.5  MCV 89.1   < > 88.5 86.8 84.6  PLT 158   < > 165 143* 159   < > = values in this interval not displayed.   Lab Results  Component Value Date   TSH 23.59 (H) 10/03/2017   Lab Results  Component Value Date   HGBA1C 6.4 (H) 10/03/2017   Lab Results  Component Value Date   CHOL 171 06/26/2017   HDL 45 (L) 06/26/2017   LDLCALC 104 (H) 06/26/2017   LDLDIRECT 99.4 03/13/2012   TRIG 128 06/26/2017   CHOLHDL 3.8 06/26/2017    Significant Diagnostic Results in last 30 days:  No results found.  Assessment/Plan  1. Lymphedema Increase lasix to 60 mg daily from 40 mg daily. Continue metolazone and kcl.  - furosemide (LASIX) 40 MG tablet; Take 1.5 tablets (60 mg total) by mouth daily. Along with metolazone and potassium supplement  Dispense: 135 tablet; Refill: 3 - CMP with eGFR([facility]); Future  2. Upper airway cough syndrome Improved, advised to take claritin daily and mucinex dm prn  3. Essential hypertension Controlled BP reading. Continue hctz and losartan. Check bmp  4. Weight gain Has gained about 12 lbs. Increase furosemide as above.  - TSH; Future - T4, Free; Future - Hemoglobin A1c; Future  5. Acquired hypothyroidism Continue levothyroxine 125 mcg daily.  - CMP with eGFR([facility]); Future - TSH; Future - T4,  Free; Future  6. Prediabetes - CMP with eGFR([facility]); Future - Hemoglobin A1c; Future    Family/ staff Communication: reviewed care plan with patient    Labs/tests ordered:   Lab Orders     CMP with eGFR([facility])     TSH     T4, Free     Hemoglobin A1c    Blanchie Serve, MD Internal Medicine [facility] [address] [place], [place] [address] Cell Phone (Monday-Friday 8 am - 5 pm): [phone] On Call: [phone] and follow prompts after 5 pm and on weekends Office Phone: [phone] Office Fax: [phone]

## 2017-11-22 ENCOUNTER — Other Ambulatory Visit: Payer: Self-pay | Admitting: *Deleted

## 2017-11-22 MED ORDER — ONDANSETRON HCL 4 MG PO TABS: 4.0000 mg | ORAL_TABLET | Freq: Four times a day (QID) | ORAL | 0 refills | Status: DC | PRN

## 2017-11-22 NOTE — Telephone Encounter (Signed)
[facility]  

## 2017-12-04 ENCOUNTER — Encounter: Payer: Medicare Other | Admitting: Internal Medicine

## 2017-12-11 DIAGNOSIS — I890 Lymphedema, not elsewhere classified: Secondary | ICD-10-CM | POA: Diagnosis not present

## 2017-12-11 DIAGNOSIS — R7303 Prediabetes: Secondary | ICD-10-CM

## 2017-12-11 DIAGNOSIS — R635 Abnormal weight gain: Secondary | ICD-10-CM | POA: Diagnosis not present

## 2017-12-11 DIAGNOSIS — E039 Hypothyroidism, unspecified: Secondary | ICD-10-CM

## 2017-12-12 LAB — COMPLETE METABOLIC PANEL WITH GFR
AG Ratio: 1.2 (calc) (ref 1.0–2.5)
ALBUMIN MSPROF: 3.4 g/dL — AB (ref 3.6–5.1)
ALT: 11 U/L (ref 6–29)
AST: 13 U/L (ref 10–35)
Alkaline phosphatase (APISO): 84 U/L (ref 33–130)
BILIRUBIN TOTAL: 0.5 mg/dL (ref 0.2–1.2)
BUN: 22 mg/dL (ref 7–25)
CHLORIDE: 98 mmol/L (ref 98–110)
CO2: 33 mmol/L — AB (ref 20–32)
Calcium: 8.1 mg/dL — ABNORMAL LOW (ref 8.6–10.4)
Creat: 0.88 mg/dL (ref 0.60–0.88)
GFR, EST AFRICAN AMERICAN: 69 mL/min/{1.73_m2} (ref >=60)
GFR, Est Non African American: 60 mL/min/{1.73_m2} (ref >=60)
Globulin: 2.9 g/dL (calc) (ref 1.9–3.7)
Glucose, Bld: 109 mg/dL — ABNORMAL HIGH (ref 65–99)
POTASSIUM: 3.2 mmol/L — AB (ref 3.5–5.3)
SODIUM: 141 mmol/L (ref 135–146)
TOTAL PROTEIN: 6.3 g/dL (ref 6.1–8.1)

## 2017-12-12 LAB — TSH: TSH: 8.77 mIU/L — ABNORMAL HIGH (ref 0.40–4.50)

## 2017-12-12 LAB — HEMOGLOBIN A1C
Hgb A1c MFr Bld: 6.3 % of total Hgb — ABNORMAL HIGH (ref <5.7)
Mean Plasma Glucose: 134 (calc)
eAG (mmol/L): 7.4 (calc)

## 2017-12-12 LAB — T4, FREE: Free T4: 1.1 ng/dL (ref 0.8–1.8)

## 2017-12-18 ENCOUNTER — Non-Acute Institutional Stay: Payer: Medicare Other | Admitting: Internal Medicine

## 2017-12-18 ENCOUNTER — Encounter: Payer: Self-pay | Admitting: Internal Medicine

## 2017-12-18 VITALS — BP 124/62 | HR 91 | Temp 97.6°F | Resp 18 | Ht 61.0 in | Wt 294.4 lb

## 2017-12-18 DIAGNOSIS — E039 Hypothyroidism, unspecified: Secondary | ICD-10-CM

## 2017-12-18 DIAGNOSIS — R7303 Prediabetes: Secondary | ICD-10-CM | POA: Diagnosis not present

## 2017-12-18 DIAGNOSIS — I10 Essential (primary) hypertension: Secondary | ICD-10-CM

## 2017-12-18 DIAGNOSIS — R05 Cough: Secondary | ICD-10-CM

## 2017-12-18 DIAGNOSIS — I890 Lymphedema, not elsewhere classified: Secondary | ICD-10-CM

## 2017-12-18 DIAGNOSIS — E785 Hyperlipidemia, unspecified: Secondary | ICD-10-CM

## 2017-12-18 DIAGNOSIS — R058 Other specified cough: Secondary | ICD-10-CM

## 2017-12-18 MED ORDER — SERTRALINE HCL 50 MG PO TABS: 50.0000 mg | ORAL_TABLET | Freq: Every day | ORAL | 3 refills | Status: DC

## 2017-12-18 NOTE — Progress Notes (Signed)
Location:  [facility] of Service:  Clinic (12) Provider:  Blanchie Serve MD  Blanchie Serve, MD  Patient Care Team: Blanchie Serve, MD as PCP - General (Internal Medicine) Azucena Fallen, MD as Consulting Physician (Obstetrics and Gynecology) Chesley Mires, MD as Consulting Physician (Pulmonary Disease) Mast, Man X, NP as Nurse Practitioner (Internal Medicine) Christin Fudge, MD as Consulting Physician (Surgery)  Extended Emergency Contact Information Primary Emergency Contact: Boxman,Lee Address: [address]          [place], [place] [address] Johnnette Litter of [place] Phone: [phone] Mobile Phone: [phone] Relation: Daughter Secondary Emergency Contact: Ritta Slot [place] of [place] Mobile Phone: [phone] Relation: Daughter  Code Status:  DNR Goals of care: Advanced Directive information Advanced Directives 12/18/2017  Does Patient Have a Medical Advance Directive? Yes  Type of Paramedic of [place];Living will;Out of facility DNR (pink MOST or yellow form)  Does patient want to make changes to medical advance directive? No - Patient declined  Copy of [place] in Chart? Yes  Would patient like information on creating a medical advance directive? -  Pre-existing out of facility DNR order (yellow form or pink MOST form) Pink MOST form placed in chart (order not valid for inpatient use);Yellow form placed in chart (order not valid for inpatient use)     Chief Complaint  Patient presents with  . Medical Management of Chronic Issues    4 week follow up. No acute concerns at this time.   . Medication Refill    No refills needed at this time  . Results    Discuss labs    HPI:  Pt is a 84 y.o. female seen today for follow up on her leg edema, cough and blood pressure readings. Her lab work results have resulted.   Hypertension- overall stable on review, SBP 114-140/ 64-90. One reading of  147/89 and one low reading of 105/64. Currently on hydrochlorthiazide 25 mg daily, losartan 100 mg daily. Denies headache, chest pain or dyspnea.   Upper airway cough syndrome- currently on claritin 10 mg daily as needed. Not taking mucinex anymore. Cough symptom improved. Also on omeprazole.   Hypothyroidism- improved TSH from before. Currently on levothyroxine 125 mcg daily.   Lymphedema- improved, occasional pain, taking lasix 60 mg daily with kcl 60 meq am and 40 meq pm, also on metolazone 2.5 mg daily     Past Medical History:  Diagnosis Date  . ARTHRITIS, KNEES, BILATERAL   . Breast CA ([place]) 2000   s/p R lumpectomy and XRT  . DEPRESSION   . DJD (degenerative joint disease) of knee   . GERD   . HYPERLIPIDEMIA   . HYPERTENSION   . HYPOTHYROIDISM    postsurgical  . Hypoxia   . MIGRAINE HEADACHE   . Morbid obesity ([facility])   . OSA (obstructive sleep apnea) 01/17/2011 dx  . Oxygen dependent   . Urge incontinence   . UTI (lower urinary tract infection) 12/2014   Past Surgical History:  Procedure Laterality Date  . ABDOMINAL HYSTERECTOMY    . ADENOIDECTOMY    . BREAST BIOPSY  2000  . BREAST LUMPECTOMY  2000  . CATARACT EXTRACTION    . CHOLECYSTECTOMY  1995  . CYSTOSCOPY/URETEROSCOPY/HOLMIUM LASER Right 06/28/2015   Procedure: CYSTOSCOPY RIGHT RETROGRAD RIGHT URETEROSCOPY/HOLMIUM LASER WITH RIGHT STENT PLACEMENT;  Surgeon: Alexis Frock, MD;  Location: [facility];  Service: Urology;  Laterality: Right;  . PARATHYROIDECTOMY  2-3 removed  . REPLACEMENT TOTAL KNEE BILATERAL  1999, 2005  . Right leg femur repaired    . THYROIDECTOMY    . TONSILLECTOMY    . TUBAL LIGATION      Allergies  Allergen Reactions  . Azithromycin Itching  . Beta Adrenergic Blockers     Depression   . Ciprofloxacin Other (See Comments)    REACTION: edgy and very jumpy   . Codeine Nausea And Vomiting  . Epinephrine Other (See Comments)    REACTION: rapid pulse, sweats  . Klonopin [Clonazepam]  Other (See Comments)    Makes her feel very suicidal   . Oxycodone Other (See Comments)    Patient felt like it altered her mental status "Crazy" . Hallucinations later as well.  [name] Paxil [Paroxetine Hydrochloride] Other (See Comments)    Sever depression   . Prednisone     "makes me feel really bad"  . Propoxyphene Hcl Nausea And Vomiting  . Torsemide   . Tramadol Other (See Comments)    Feels "jittery"  . Vicodin [Hydrocodone-Acetaminophen] Other (See Comments)    Hallucinations  . Meloxicam Diarrhea  . Vancomycin Rash    Localized rash related to infusion rate.    Outpatient Encounter Medications as of 12/18/2017  Medication Sig  . acetaminophen (TYLENOL) 325 MG tablet Take 650 mg by mouth every 8 (eight) hours as needed for mild pain or moderate pain.   [name] ALPRAZolam (XANAX) 0.5 MG tablet TAKE 1 TABLET TWICE DAILY AS NEEDED FOR ANXIETY.  [name] aspirin 81 MG tablet Take 81 mg by mouth daily.   . Calcium Polycarbophil (FIBER-CAPS PO) Take 1 capsule by mouth as needed.  [name] dextromethorphan-guaiFENesin (MUCINEX DM) 30-600 MG 12hr tablet Take 1 tablet by mouth 2 (two) times daily as needed for cough.  . hydrochlorothiazide (HYDRODIURIL) 25 MG tablet TAKE 1 TABLET EACH DAY.  [name] levothyroxine (SYNTHROID, LEVOTHROID) 125 MCG tablet Take 1 tablet (125 mcg total) by mouth daily.  [name] losartan (COZAAR) 100 MG tablet TAKE 1 TABLET ONCE DAILY. HOLD FOR SYSTOLIC BLOOD PRESSURE LESS THAN 110.  . metolazone (ZAROXOLYN) 2.5 MG tablet Take 1 tablet (2.5 mg total) by mouth daily.  [name] omeprazole (PRILOSEC) 20 MG capsule TAKE (1) CAPSULE DAILY.  [name] ondansetron (ZOFRAN) 4 MG tablet Take 1 tablet (4 mg total) by mouth every 6 (six) hours as needed for nausea.  . potassium chloride (KLOR-CON) 20 MEQ packet Take 60 mEq by mouth every morning.  . potassium chloride (KLOR-CON) 20 MEQ packet Take 40 mEq by mouth at bedtime.  . sertraline (ZOLOFT) 25 MG tablet Take 25 mg by mouth daily. Take 1 tablet (25 mg) with 1  tablet (50 mg) to equal 75 mg.  . sertraline (ZOLOFT) 50 MG tablet Take 1 tablet (50 mg total) by mouth daily.  . furosemide (LASIX) 40 MG tablet Take 1.5 tablets (60 mg total) by mouth daily. Along with metolazone and potassium supplement (Patient not taking: Reported on 12/18/2017)  . loratadine (CLARITIN) 10 MG tablet Take 1 tablet (10 mg total) by mouth daily. (Patient not taking: Reported on 12/18/2017)   No facility-administered encounter medications on file as of 12/18/2017.     Review of Systems  Constitutional: Negative for appetite change, chills and fever.  HENT: Negative for congestion, mouth sores, postnasal drip, rhinorrhea, sore throat and trouble swallowing.   Respiratory: Negative for cough and shortness of breath.        Improved leg edema. Cough has resolved  Cardiovascular: Positive for  leg swelling. Negative for chest pain and palpitations.  Gastrointestinal: Negative for abdominal pain, constipation, diarrhea, nausea and vomiting.  Genitourinary: Positive for frequency. Negative for dysuria and hematuria.  Musculoskeletal: Positive for arthralgias and gait problem.  Neurological: Negative for headaches.  Psychiatric/Behavioral: Negative for confusion and dysphoric mood.    Immunization History  Administered Date(s) Administered  . Influenza Split 03/20/2011  . Influenza Whole 03/19/2009, 03/19/2012  . Influenza,inj,Quad PF,6+ Mos 03/19/2013  . Influenza-Unspecified 03/03/2014, 03/15/2015, 03/30/2016, 04/05/2017  . Meningococcal Polysaccharide 03/15/2015  . PPD Test 03/30/2016  . Pneumococcal Conjugate-13 03/12/2014  . Pneumococcal Polysaccharide-23 06/19/2006, 03/15/2015  . Td 10/04/2009  . Tdap 06/19/2005  . Zoster 06/19/2010   Pertinent  Health Maintenance Due  Topic Date Due  . DEXA SCAN  09/07/1997  . INFLUENZA VACCINE  01/17/2018  . PNA vac Low Risk Adult  Completed   Fall Risk  08/30/2017 06/07/2017 12/22/2016 04/06/2016 11/27/2015  Falls in the past year?  No No Yes Yes No  Number falls in past yr: - - 1 1 -  Injury with Fall? - - No Yes -  Risk for fall due to : - - - - -   Functional Status Survey:    Vitals:   12/18/17 1132  BP: 124/62  Pulse: 91  Resp: 18  Temp: 97.6 F (36.4 C)  TempSrc: Oral  SpO2: 93%  Weight: 294 lb 6.4 oz (133.5 kg)  Height: 5' 1" (1.549 m)   Body mass index is 55.63 kg/m.   Wt Readings from Last 3 Encounters:  12/18/17 294 lb 6.4 oz (133.5 kg)  11/20/17 (!) 302 lb 9.6 oz (137.3 kg)  10/16/17 289 lb (131.1 kg)   Physical Exam  Constitutional: She is oriented to person, place, and time.  Morbidly obese elderly female in no acute distress  HENT:  Head: Normocephalic and atraumatic.  Nose: Nose normal.  Mouth/Throat: Oropharynx is clear and moist. No oropharyngeal exudate.  Eyes: Pupils are equal, round, and reactive to light. Conjunctivae and EOM are normal. Right eye exhibits no discharge. Left eye exhibits no discharge.  Neck: Normal range of motion. Neck supple.  Cardiovascular: Normal rate and regular rhythm.  Pulmonary/Chest: Effort normal and breath sounds normal. No respiratory distress. She has no wheezes. She has no rales.  Abdominal: Soft. Bowel sounds are normal. There is no tenderness. There is no guarding.  Musculoskeletal: She exhibits edema.  Lymphedema to both legs, unsteady gait, uses motorized wheelchair  Lymphadenopathy:    She has no cervical adenopathy.  Neurological: She is alert and oriented to person, place, and time.  Skin: Skin is warm and dry. She is not diaphoretic.  Psychiatric: She has a normal mood and affect.    Labs reviewed: Recent Labs    07/16/17 1109 07/17/17 0152 07/17/17 0825 07/26/17 0745 12/11/17 0000  NA 139 139  --  141 141  K 2.8* 3.5  --  3.5 3.2*  CL 100* 101  --  102 98  CO2 31 30  --  30 33*  GLUCOSE 125* 128*  --  99 109*  BUN 17 13  --  18 22  CREATININE 1.06* 0.80  --  0.83 0.88  CALCIUM 8.2* 8.2*  --  8.3* 8.1*  MG 1.5*  --  2.5*   --   --    Recent Labs    01/12/17 0307  04/03/17  07/16/17 1109 07/17/17 0152 07/26/17 0745 12/11/17 0000  AST 15   < > 16   < >  _0 ALT 13*   < > 10   < > 13* 13* 10 11  ALKPHOS 73   < > 86  --  96 82  --   --   BILITOT 0.7  --   --    < > 0.7 0.6 0.5 0.5  PROT 6.5  --   --    < > 6.8 6.1* 6.1 6.3  ALBUMIN 3.4*  --   --   --  3.2* 2.9*  --   --    < > = values in this interval not displayed.   Recent Labs    01/12/17 0307  07/16/17 1109 07/17/17 0152 07/26/17 0745  WBC 6.1   < > 6.9 8.3 7.2  NEUTROABS 4.2  --  5.3  --  5,162  HGB 12.8   < > 12.8 13.0 13.2  HCT 38.5   < > 39.3 38.3 38.5  MCV 89.1   < > 88.5 86.8 84.6  PLT 158   < > 165 143* 159   < > = values in this interval not displayed.   Lab Results  Component Value Date   TSH 8.77 (H) 12/11/2017   Lab Results  Component Value Date   HGBA1C 6.3 (H) 12/11/2017   Lab Results  Component Value Date   CHOL 171 06/26/2017   HDL 45 (L) 06/26/2017   LDLCALC 104 (H) 06/26/2017   LDLDIRECT 99.4 03/13/2012   TRIG 128 06/26/2017   CHOLHDL 3.8 06/26/2017    Significant Diagnostic Results in last 30 days:  No results found.  Assessment/Plan  1. Lymphedema of both lower extremities Improved leg edema. Continue lasix 60 mg daily with metolazone 2.5 mg daily and kcl supplement. Encouraged to take a banana a day. Will not increase kcl at present. Check bmp in 1 week. If k remains low, consider adjusting dosing of kcl.  - BMP with eGFR([facility]); Future - CMP with eGFR([facility]); Future  2. Essential hypertension Stable BP, continue hctz and losartan. Bmp reviewed.  - BMP with eGFR([facility]); Future - CMP with eGFR([facility]); Future  3. Hypothyroidism (acquired) Continue levothyroxine, improved TSH - TSH; Future - T4, Free; Future  4. Upper airway cough syndrome Change claritin to prn only. D/c mucinex for non use  5. Prediabetes Monitor dietary intake.  - BMP with eGFR([facility]); Future - CMP with  eGFR([facility]); Future - CBC (no diff); Future - Lipid Panel; Future - Hemoglobin A1c; Future  6. Hyperlipidemia LDL goal <100 Continue statin - Lipid Panel; Future   Family/ staff Communication: reviewed care plan with patient and charge nurse.    Labs/tests ordered:   Lab Orders     BMP with eGFR([facility])     CMP with eGFR([facility])     CBC (no diff)     Lipid Panel     TSH     T4, Free     Hemoglobin A1c  F/u in 3 months   Blanchie Serve, MD Internal Medicine [facility] [address] [place], [place] [address] Cell Phone (Monday-Friday 8 am - 5 pm): [phone] On Call: [phone] and follow prompts after 5 pm and on weekends Office Phone: [phone] Office Fax: [phone]

## 2017-12-19 ENCOUNTER — Encounter: Payer: Self-pay | Admitting: Internal Medicine

## 2017-12-25 ENCOUNTER — Encounter: Payer: Medicare Other | Admitting: Internal Medicine

## 2017-12-25 DIAGNOSIS — R7303 Prediabetes: Secondary | ICD-10-CM | POA: Diagnosis not present

## 2017-12-25 DIAGNOSIS — I10 Essential (primary) hypertension: Secondary | ICD-10-CM

## 2017-12-25 DIAGNOSIS — I890 Lymphedema, not elsewhere classified: Secondary | ICD-10-CM | POA: Diagnosis not present

## 2017-12-25 LAB — BASIC METABOLIC PANEL WITH GFR
BUN / CREAT RATIO: 23 (calc) — AB (ref 6–22)
BUN: 22 mg/dL (ref 7–25)
CO2: 34 mmol/L — ABNORMAL HIGH (ref 20–32)
CREATININE: 0.94 mg/dL — AB (ref 0.60–0.88)
Calcium: 8.1 mg/dL — ABNORMAL LOW (ref 8.6–10.4)
Chloride: 99 mmol/L (ref 98–110)
GFR, EST AFRICAN AMERICAN: 64 mL/min/{1.73_m2} (ref >=60)
GFR, EST NON AFRICAN AMERICAN: 55 mL/min/{1.73_m2} — AB (ref >=60)
Glucose, Bld: 122 mg/dL — ABNORMAL HIGH (ref 65–99)
Potassium: 3.1 mmol/L — ABNORMAL LOW (ref 3.5–5.3)
SODIUM: 140 mmol/L (ref 135–146)

## 2017-12-26 ENCOUNTER — Other Ambulatory Visit: Payer: Self-pay

## 2017-12-26 DIAGNOSIS — E876 Hypokalemia: Secondary | ICD-10-CM

## 2017-12-31 ENCOUNTER — Other Ambulatory Visit: Payer: Self-pay | Admitting: Internal Medicine

## 2017-12-31 ENCOUNTER — Telehealth: Payer: Self-pay

## 2017-12-31 NOTE — Telephone Encounter (Signed)
Per Dr. Bubba Camp patient called on call doctor stating that she was having leg cramps. I attempted to call Mrs. Steinbach to see if her leg cramps have improved. Patient is scheduled to have a BMP and Magnesium drawn tomorrow. I will attempt to call the patient later.

## 2018-01-01 DIAGNOSIS — E876 Hypokalemia: Secondary | ICD-10-CM | POA: Diagnosis not present

## 2018-01-01 LAB — BASIC METABOLIC PANEL
BUN: 17 mg/dL (ref 7–25)
CO2: 36 mmol/L — AB (ref 20–32)
CREATININE: 0.86 mg/dL (ref 0.60–0.88)
Calcium: 8 mg/dL — ABNORMAL LOW (ref 8.6–10.4)
Chloride: 100 mmol/L (ref 98–110)
GLUCOSE: 115 mg/dL — AB (ref 65–99)
Potassium: 4 mmol/L (ref 3.5–5.3)
Sodium: 142 mmol/L (ref 135–146)

## 2018-01-01 LAB — MAGNESIUM: Magnesium: 1.8 mg/dL (ref 1.5–2.5)

## 2018-01-07 ENCOUNTER — Other Ambulatory Visit: Payer: Self-pay | Admitting: Nurse Practitioner

## 2018-01-07 ENCOUNTER — Other Ambulatory Visit: Payer: Self-pay | Admitting: Internal Medicine

## 2018-01-07 NOTE — Telephone Encounter (Signed)
[place] DATABASE VERIFIED LAST FILLED 11/15/2017

## 2018-01-08 ENCOUNTER — Encounter: Payer: Self-pay | Admitting: Internal Medicine

## 2018-01-08 ENCOUNTER — Other Ambulatory Visit: Payer: Self-pay

## 2018-01-08 DIAGNOSIS — E876 Hypokalemia: Secondary | ICD-10-CM

## 2018-01-08 NOTE — Telephone Encounter (Signed)
Patient called and left message on Clinical intake stating that she needed to speak with [place] regarding her leg cramps. Stated that she was scheduled for blood work today and it was turned into an appointment at 10:00. Stated that she had to cancel this because she was not told that she would have to have an appointment. Stated that she left message for [place] to call her but has not heard anything from her and she needs to find out what is going on with her legs.  Please call patient [phone]

## 2018-01-09 ENCOUNTER — Other Ambulatory Visit: Payer: Self-pay | Admitting: Internal Medicine

## 2018-01-10 ENCOUNTER — Other Ambulatory Visit: Payer: Self-pay

## 2018-01-10 DIAGNOSIS — E876 Hypokalemia: Secondary | ICD-10-CM | POA: Diagnosis not present

## 2018-01-10 LAB — BASIC METABOLIC PANEL
BUN / CREAT RATIO: 25 (calc) — AB (ref 6–22)
BUN: 22 mg/dL (ref 7–25)
CO2: 33 mmol/L — ABNORMAL HIGH (ref 20–32)
CREATININE: 0.89 mg/dL — AB (ref 0.60–0.88)
Calcium: 8.1 mg/dL — ABNORMAL LOW (ref 8.6–10.4)
Chloride: 99 mmol/L (ref 98–110)
GLUCOSE: 86 mg/dL (ref 65–99)
Potassium: 3.7 mmol/L (ref 3.5–5.3)
SODIUM: 141 mmol/L (ref 135–146)

## 2018-01-10 LAB — MAGNESIUM: Magnesium: 1.7 mg/dL (ref 1.5–2.5)

## 2018-01-15 ENCOUNTER — Other Ambulatory Visit: Payer: Self-pay | Admitting: Internal Medicine

## 2018-01-16 ENCOUNTER — Other Ambulatory Visit: Payer: Self-pay | Admitting: Internal Medicine

## 2018-01-18 DIAGNOSIS — H6521 Chronic serous otitis media, right ear: Secondary | ICD-10-CM | POA: Diagnosis not present

## 2018-01-24 ENCOUNTER — Encounter: Payer: Self-pay | Admitting: Nurse Practitioner

## 2018-01-24 ENCOUNTER — Non-Acute Institutional Stay: Payer: Medicare Other | Admitting: Nurse Practitioner

## 2018-01-24 DIAGNOSIS — R0602 Shortness of breath: Secondary | ICD-10-CM

## 2018-01-24 DIAGNOSIS — J069 Acute upper respiratory infection, unspecified: Secondary | ICD-10-CM | POA: Diagnosis not present

## 2018-01-24 MED ORDER — AZITHROMYCIN 250 MG PO TABS: ORAL_TABLET | ORAL | 0 refills | Status: DC

## 2018-01-24 NOTE — Assessment & Plan Note (Addendum)
Nasal congestion, hacking cough, trial of Azithromycin( the patient stated she has no recollection of itching associated with Azithromycin use and wants to try it again with caution), Claritin. Delay CBC/diff since the patient is afebrile.

## 2018-01-24 NOTE — Patient Instructions (Signed)
May obtain CXR if no better after trial of Azithromycin and Claritin.

## 2018-01-24 NOTE — Assessment & Plan Note (Addendum)
May obtain CXR if no better after trial of Claritin and Azithromycin for clinically allergic symptoms(hacking cough) and URI

## 2018-01-24 NOTE — Progress Notes (Signed)
Location:   clinic [place]   Place of Service:  Clinic (12) Provider: Marlana Latus NP  Code Status: DNR Goals of Care: [place] 12/18/2017  Does Patient Have a Medical Advance Directive? Yes  Type of Paramedic of [place];Living will;Out of facility DNR (pink MOST or yellow form)  Does patient want to make changes to medical advance directive? No - Patient declined  Copy of [place] in Chart? Yes  Would patient like information on creating a medical advance directive? -  Pre-existing out of facility DNR order (yellow form or pink MOST form) Pink MOST form placed in chart (order not valid for inpatient use);Yellow form placed in chart (order not valid for inpatient use)     Chief Complaint  Patient presents with  . Acute Visit    SOB, O2 is below 90, nausea, feels bad x 1 week    HPI: Patient is a 84 y.o. female seen today for an acute visit for SOB, hacking bough,  for a week, no O2 desaturation, afebrile, sputum, denied chest pain, palpitation, or sense of impending doom. Recently restarted on Flonase x 7 days for right eat and nasal congestion per ENT's recommendation, but no change of condition noted. She denied running nose, watery eyes, or sneezing. Edema in legs reminds no change.  Hx of upper airway cough syndrome, Dyspnea, recurrent URIs.  Past Medical History:  Diagnosis Date  . ARTHRITIS, KNEES, BILATERAL   . Breast CA ([place]) 2000   s/p R lumpectomy and XRT  . DEPRESSION   . DJD (degenerative joint disease) of knee   . GERD   . HYPERLIPIDEMIA   . HYPERTENSION   . HYPOTHYROIDISM    postsurgical  . Hypoxia   . MIGRAINE HEADACHE   . Morbid obesity ([place])   . OSA (obstructive sleep apnea) 01/17/2011 dx  . Oxygen dependent   . Urge incontinence   . UTI (lower urinary tract infection) 12/2014    Past Surgical History:  Procedure Laterality Date  . ABDOMINAL HYSTERECTOMY    . ADENOIDECTOMY    . BREAST BIOPSY  2000   . BREAST LUMPECTOMY  2000  . CATARACT EXTRACTION    . CHOLECYSTECTOMY  1995  . CYSTOSCOPY/URETEROSCOPY/HOLMIUM LASER Right 06/28/2015   Procedure: CYSTOSCOPY RIGHT RETROGRAD RIGHT URETEROSCOPY/HOLMIUM LASER WITH RIGHT STENT PLACEMENT;  Surgeon: Alexis Frock, MD;  Location: [facility];  Service: Urology;  Laterality: Right;  . PARATHYROIDECTOMY     2-3 removed  . REPLACEMENT TOTAL KNEE BILATERAL  1999, 2005  . Right leg femur repaired    . THYROIDECTOMY    . TONSILLECTOMY    . TUBAL LIGATION      Allergies  Allergen Reactions  . Azithromycin Itching  . Beta Adrenergic Blockers     Depression   . Ciprofloxacin Other (See Comments)    REACTION: edgy and very jumpy   . Codeine Nausea And Vomiting  . Epinephrine Other (See Comments)    REACTION: rapid pulse, sweats  . Klonopin [Clonazepam] Other (See Comments)    Makes her feel very suicidal   . Oxycodone Other (See Comments)    Patient felt like it altered her mental status "Crazy" . Hallucinations later as well.  [name] Paxil [Paroxetine Hydrochloride] Other (See Comments)    Sever depression   . Prednisone     "makes me feel really bad"  . Propoxyphene Hcl Nausea And Vomiting  . Torsemide   . Tramadol Other (See Comments)  Feels "jittery"  . Vicodin [Hydrocodone-Acetaminophen] Other (See Comments)    Hallucinations  . Meloxicam Diarrhea  . Vancomycin Rash    Localized rash related to infusion rate.    Allergies as of 01/24/2018      Reactions   Azithromycin Itching   Beta Adrenergic Blockers    Depression   Ciprofloxacin Other (See Comments)   REACTION: edgy and very jumpy    Codeine Nausea And Vomiting   Epinephrine Other (See Comments)   REACTION: rapid pulse, sweats   Klonopin [clonazepam] Other (See Comments)   Makes her feel very suicidal    Oxycodone Other (See Comments)   Patient felt like it altered her mental status "Crazy" . Hallucinations later as well.   Paxil [paroxetine Hydrochloride] Other (See  Comments)   Sever depression    Prednisone    "makes me feel really bad"   Propoxyphene Hcl Nausea And Vomiting   Torsemide    Tramadol Other (See Comments)   Feels "jittery"   Vicodin [hydrocodone-acetaminophen] Other (See Comments)   Hallucinations   Meloxicam Diarrhea   Vancomycin Rash   Localized rash related to infusion rate.      Medication List        Accurate as of 01/24/18 11:59 PM. Always use your most recent med list.          acetaminophen 325 MG tablet Commonly known as:  TYLENOL Take 650 mg by mouth every 8 (eight) hours as needed for mild pain or moderate pain.   ALPRAZolam 0.5 MG tablet Commonly known as:  XANAX TAKE 1 TABLET TWICE DAILY AS NEEDED FOR ANXIETY.   aspirin 81 MG tablet Take 81 mg by mouth daily.   azithromycin 250 MG tablet Commonly known as:  ZITHROMAX Taking 2 tabs equal to $Remove'500mg'ajnUsRD$  for day 1, then I tab $Rem'250mg'eRqI$  qd for day 2 through day 5   FIBER-CAPS PO Take 1 capsule by mouth as needed.   fluticasone 50 MCG/ACT nasal spray Commonly known as:  FLONASE Place 2 sprays into both nostrils daily.   furosemide 40 MG tablet Commonly known as:  LASIX Take 1.5 tablets (60 mg total) by mouth daily. Along with metolazone and potassium supplement   hydrochlorothiazide 25 MG tablet Commonly known as:  HYDRODIURIL TAKE 1 TABLET EACH DAY.   levothyroxine 125 MCG tablet Commonly known as:  SYNTHROID, LEVOTHROID Take 1 tablet (125 mcg total) by mouth daily.   loratadine 10 MG tablet Commonly known as:  CLARITIN Take 10 mg by mouth daily as needed for allergies.   losartan 100 MG tablet Commonly known as:  COZAAR TAKE 1 TABLET ONCE DAILY. HOLD FOR SYSTOLIC BLOOD PRESSURE LESS THAN 110.   metolazone 2.5 MG tablet Commonly known as:  ZAROXOLYN Take 1 tablet (2.5 mg total) by mouth daily.   omeprazole 20 MG capsule Commonly known as:  PRILOSEC TAKE (1) CAPSULE DAILY.   ondansetron 4 MG tablet Commonly known as:  ZOFRAN TAKE (1) TABLET BY  MOUTH EVERY SIX HOURS AS NEEDED FOR NAUSEA.   potassium chloride 20 MEQ packet Commonly known as:  KLOR-CON Take by mouth. Taking 30 in am and 30 at bedtime   sertraline 25 MG tablet Commonly known as:  ZOLOFT Take 25 mg by mouth daily. Take 1 tablet (25 mg) with 1 tablet (50 mg) to equal 75 mg.   sertraline 50 MG tablet Commonly known as:  ZOLOFT Take 1 tablet (50 mg total) by mouth daily. With 25 mg daily for total of  75 mg daily       Review of Systems:  Review of Systems  Constitutional: Negative for activity change, appetite change, chills, diaphoresis, fatigue and fever.  HENT: Positive for congestion and hearing loss. Negative for voice change.   Respiratory: Positive for cough and shortness of breath. Negative for wheezing.   Cardiovascular: Positive for leg swelling. Negative for chest pain and palpitations.  Gastrointestinal: Negative for abdominal distention.  Genitourinary: Negative for difficulty urinating and dysuria.  Musculoskeletal: Positive for arthralgias and gait problem.  Psychiatric/Behavioral: Negative for agitation, behavioral problems, confusion, hallucinations and sleep disturbance. The patient is nervous/anxious.        Feel stressed since her husband in [facility] is declining.     Health Maintenance  Topic Date Due  . DEXA SCAN  09/07/1997  . INFLUENZA VACCINE  01/17/2018  . TETANUS/TDAP  10/05/2019  . PNA vac Low Risk Adult  Completed    Physical Exam: Vitals:   01/24/18 0831  BP: 140/74  Pulse: 83  Resp: 20  Temp: 98.4 F (36.9 C)  SpO2: 93%  Weight: 298 lb 12.8 oz (135.5 kg)  Height: $Remove'5\' 1"'jqoDuKZ$  (1.549 m)   Body mass index is 56.46 kg/m. Physical Exam  Constitutional: She is oriented to person, place, and time. She appears well-developed and well-nourished.  HENT:  Head: Normocephalic and atraumatic.  Nose: Nose normal.  Mouth/Throat: Oropharynx is clear and moist.  Right ear drum is dull  Eyes: Pupils are equal, round, and reactive to  light. EOM are normal.  Neck: Normal range of motion. Neck supple. No JVD present. No thyromegaly present.  Cardiovascular: Normal rate and regular rhythm.  No murmur heard. Pulmonary/Chest: She has no wheezes. She has no rales.  Abdominal: Soft. She exhibits no distension. There is no tenderness.  Musculoskeletal: She exhibits edema.  Chronic edema BLE. Electric scooter for mobility.   Neurological: She is alert and oriented to person, place, and time. No cranial nerve deficit. She exhibits normal muscle tone. Coordination normal.  Skin: Skin is warm and dry.  Psychiatric: She has a normal mood and affect. Her behavior is normal. Judgment and thought content normal.    Labs reviewed: Basic Metabolic Panel: Recent Labs    07/17/17 0825  09/03/17 0705 10/03/17 0901 12/11/17 0000 12/25/17 0700 01/01/18 0715 01/10/18 0000  NA  --    < >  --   --  141 140 142 141  K  --    < >  --   --  3.2* 3.1* 4.0 3.7  CL  --    < >  --   --  98 99 100 99  CO2  --    < >  --   --  33* 34* 36* 33*  GLUCOSE  --    < >  --   --  109* 122* 115* 86  BUN  --    < >  --   --  $R'22 22 17 22  'zi$ CREATININE  --    < >  --   --  0.88 0.94* 0.86 0.89*  CALCIUM  --    < >  --   --  8.1* 8.1* 8.0* 8.1*  MG 2.5*  --   --   --   --   --  1.8 1.7  TSH  --    < > 7.61* 23.59* 8.77*  --   --   --    < > = values in this interval  not displayed.   Liver Function Tests: Recent Labs    04/03/17  07/16/17 1109 07/17/17 0152 07/26/17 0745 12/11/17 0000  AST 16   < > $R'20 18 12 13  'Lo$ ALT 10   < > 13* 13* 10 11  ALKPHOS 86  --  96 82  --   --   BILITOT  --    < > 0.7 0.6 0.5 0.5  PROT  --    < > 6.8 6.1* 6.1 6.3  ALBUMIN  --   --  3.2* 2.9*  --   --    < > = values in this interval not displayed.   Recent Labs    07/16/17 1109  LIPASE 22   No results for input(s): AMMONIA in the last 8760 hours. CBC: Recent Labs    07/16/17 1109 07/17/17 0152 07/26/17 0745  WBC 6.9 8.3 7.2  NEUTROABS 5.3  --  5,162  HGB  12.8 13.0 13.2  HCT 39.3 38.3 38.5  MCV 88.5 86.8 84.6  PLT 165 143* 159   Lipid Panel: Recent Labs    06/26/17 0655  CHOL 171  HDL 45*  LDLCALC 104*  TRIG 128  CHOLHDL 3.8   Lab Results  Component Value Date   HGBA1C 6.3 (H) 12/11/2017    Procedures since last visit: No results found.  Assessment/Plan SOB (shortness of breath) May obtain CXR if no better after trial of Claritin and Azithromycin for clinically allergic symptoms(hacking cough) and URI  URI (upper respiratory infection) Nasal congestion, hacking cough, trial of Azithromycin( the patient stated she has no recollection of itching associated with Azithromycin use and wants to try it again with caution), Claritin. Delay CBC/diff since the patient is afebrile.     Labs/tests ordered:  CXR  Next appt:  03/19/2018   time spend 25 minutes.

## 2018-01-28 ENCOUNTER — Encounter: Payer: Self-pay | Admitting: Nurse Practitioner

## 2018-01-30 ENCOUNTER — Encounter: Payer: Self-pay | Admitting: Internal Medicine

## 2018-02-15 ENCOUNTER — Other Ambulatory Visit: Payer: Self-pay | Admitting: Internal Medicine

## 2018-02-15 NOTE — Telephone Encounter (Signed)
[place] Database Verified [facility] 01/07/18

## 2018-02-19 DIAGNOSIS — F419 Anxiety disorder, unspecified: Secondary | ICD-10-CM | POA: Diagnosis not present

## 2018-02-27 ENCOUNTER — Other Ambulatory Visit: Payer: Self-pay | Admitting: Internal Medicine

## 2018-02-27 NOTE — Telephone Encounter (Signed)
Please verify dosage and refill.

## 2018-02-28 ENCOUNTER — Other Ambulatory Visit: Payer: Self-pay | Admitting: *Deleted

## 2018-02-28 DIAGNOSIS — E876 Hypokalemia: Secondary | ICD-10-CM

## 2018-03-05 ENCOUNTER — Other Ambulatory Visit: Payer: Medicare Other

## 2018-03-05 DIAGNOSIS — E876 Hypokalemia: Secondary | ICD-10-CM

## 2018-03-05 DIAGNOSIS — E039 Hypothyroidism, unspecified: Secondary | ICD-10-CM

## 2018-03-05 DIAGNOSIS — R7303 Prediabetes: Secondary | ICD-10-CM

## 2018-03-05 DIAGNOSIS — I10 Essential (primary) hypertension: Secondary | ICD-10-CM | POA: Diagnosis not present

## 2018-03-05 DIAGNOSIS — E785 Hyperlipidemia, unspecified: Secondary | ICD-10-CM

## 2018-03-05 DIAGNOSIS — I890 Lymphedema, not elsewhere classified: Secondary | ICD-10-CM | POA: Diagnosis not present

## 2018-03-06 LAB — CBC
HEMATOCRIT: 38.4 % (ref 35.0–45.0)
Hemoglobin: 12.8 g/dL (ref 11.7–15.5)
MCH: 29 pg (ref 27.0–33.0)
MCHC: 33.3 g/dL (ref 32.0–36.0)
MCV: 87.1 fL (ref 80.0–100.0)
MPV: 12.5 fL (ref 7.5–12.5)
Platelets: 150 10*3/uL (ref 140–400)
RBC: 4.41 10*6/uL (ref 3.80–5.10)
RDW: 16.1 % — AB (ref 11.0–15.0)
WBC: 6.5 10*3/uL (ref 3.8–10.8)

## 2018-03-06 LAB — COMPLETE METABOLIC PANEL WITH GFR
AG RATIO: 1.3 (calc) (ref 1.0–2.5)
ALBUMIN MSPROF: 3.5 g/dL — AB (ref 3.6–5.1)
ALT: 9 U/L (ref 6–29)
AST: 14 U/L (ref 10–35)
Alkaline phosphatase (APISO): 77 U/L (ref 33–130)
BUN / CREAT RATIO: 21 (calc) (ref 6–22)
BUN: 20 mg/dL (ref 7–25)
CALCIUM: 8.1 mg/dL — AB (ref 8.6–10.4)
CO2: 31 mmol/L (ref 20–32)
Chloride: 100 mmol/L (ref 98–110)
Creat: 0.95 mg/dL — ABNORMAL HIGH (ref 0.60–0.88)
GFR, EST NON AFRICAN AMERICAN: 55 mL/min/{1.73_m2} — AB (ref >=60)
GFR, Est African American: 63 mL/min/{1.73_m2} (ref >=60)
GLOBULIN: 2.6 g/dL (ref 1.9–3.7)
Glucose, Bld: 112 mg/dL — ABNORMAL HIGH (ref 65–99)
POTASSIUM: 3.3 mmol/L — AB (ref 3.5–5.3)
SODIUM: 140 mmol/L (ref 135–146)
Total Bilirubin: 0.5 mg/dL (ref 0.2–1.2)
Total Protein: 6.1 g/dL (ref 6.1–8.1)

## 2018-03-06 LAB — LIPID PANEL
CHOLESTEROL: 153 mg/dL (ref <200)
HDL: 45 mg/dL — ABNORMAL LOW (ref >50)
LDL CHOLESTEROL (CALC): 86 mg/dL
Non-HDL Cholesterol (Calc): 108 mg/dL (calc) (ref <130)
TRIGLYCERIDES: 127 mg/dL (ref <150)
Total CHOL/HDL Ratio: 3.4 (calc) (ref <5.0)

## 2018-03-06 LAB — HEPATIC FUNCTION PANEL
AG Ratio: 1.3 (calc) (ref 1.0–2.5)
ALKALINE PHOSPHATASE (APISO): 77 U/L (ref 33–130)
ALT: 9 U/L (ref 6–29)
AST: 14 U/L (ref 10–35)
Albumin: 3.5 g/dL — ABNORMAL LOW (ref 3.6–5.1)
BILIRUBIN INDIRECT: 0.4 mg/dL (ref 0.2–1.2)
Bilirubin, Direct: 0.1 mg/dL (ref 0.0–0.2)
Globulin: 2.6 g/dL (calc) (ref 1.9–3.7)
TOTAL PROTEIN: 6.1 g/dL (ref 6.1–8.1)
Total Bilirubin: 0.5 mg/dL (ref 0.2–1.2)

## 2018-03-06 LAB — TSH: TSH: 6.51 m[IU]/L — AB (ref 0.40–4.50)

## 2018-03-06 LAB — HEMOGLOBIN A1C
HEMOGLOBIN A1C: 6.6 %{Hb} — AB (ref <5.7)
Mean Plasma Glucose: 143 (calc)
eAG (mmol/L): 7.9 (calc)

## 2018-03-06 LAB — T4, FREE: Free T4: 1.1 ng/dL (ref 0.8–1.8)

## 2018-03-07 ENCOUNTER — Other Ambulatory Visit: Payer: Medicare Other

## 2018-03-12 ENCOUNTER — Non-Acute Institutional Stay: Payer: Medicare Other | Admitting: Internal Medicine

## 2018-03-12 ENCOUNTER — Encounter: Payer: Self-pay | Admitting: Internal Medicine

## 2018-03-12 VITALS — BP 132/74 | HR 93 | Temp 97.7°F | Resp 20 | Ht 61.0 in | Wt 297.2 lb

## 2018-03-12 DIAGNOSIS — F418 Other specified anxiety disorders: Secondary | ICD-10-CM | POA: Diagnosis not present

## 2018-03-12 DIAGNOSIS — E1169 Type 2 diabetes mellitus with other specified complication: Secondary | ICD-10-CM | POA: Diagnosis not present

## 2018-03-12 DIAGNOSIS — N183 Chronic kidney disease, stage 3 unspecified: Secondary | ICD-10-CM

## 2018-03-12 DIAGNOSIS — I10 Essential (primary) hypertension: Secondary | ICD-10-CM

## 2018-03-12 DIAGNOSIS — E039 Hypothyroidism, unspecified: Secondary | ICD-10-CM

## 2018-03-12 DIAGNOSIS — Z961 Presence of intraocular lens: Secondary | ICD-10-CM | POA: Diagnosis not present

## 2018-03-12 DIAGNOSIS — E785 Hyperlipidemia, unspecified: Secondary | ICD-10-CM

## 2018-03-12 DIAGNOSIS — H04123 Dry eye syndrome of bilateral lacrimal glands: Secondary | ICD-10-CM | POA: Diagnosis not present

## 2018-03-12 DIAGNOSIS — H532 Diplopia: Secondary | ICD-10-CM | POA: Diagnosis not present

## 2018-03-12 DIAGNOSIS — K219 Gastro-esophageal reflux disease without esophagitis: Secondary | ICD-10-CM | POA: Diagnosis not present

## 2018-03-12 DIAGNOSIS — E876 Hypokalemia: Secondary | ICD-10-CM | POA: Diagnosis not present

## 2018-03-12 DIAGNOSIS — I890 Lymphedema, not elsewhere classified: Secondary | ICD-10-CM | POA: Diagnosis not present

## 2018-03-12 DIAGNOSIS — E669 Obesity, unspecified: Secondary | ICD-10-CM

## 2018-03-12 MED ORDER — HYDROCHLOROTHIAZIDE 25 MG PO TABS: 12.5000 mg | ORAL_TABLET | Freq: Every day | ORAL | 0 refills | Status: DC

## 2018-03-12 NOTE — Patient Instructions (Addendum)
I want you to take half a tablet 12.5 mg of your HCTZ 25 mg tablet. I want lab work in 1 week to assess your potassium level.   Repeat blood work in 3 months to assess your diabetes, thyroid function and kidney function.   Take your metolazone half an hour before taking your lasix/ furosemide.    DASH Eating Plan DASH stands for "Dietary Approaches to Stop Hypertension." The DASH eating plan is a healthy eating plan that has been shown to reduce high blood pressure (hypertension). It may also reduce your risk for type 2 diabetes, heart disease, and stroke. The DASH eating plan may also help with weight loss. What are tips for following this plan? General guidelines  Avoid eating more than 2,300 mg (milligrams) of salt (sodium) a day. If you have hypertension, you may need to reduce your sodium intake to 1,500 mg a day.  Limit alcohol intake to no more than 1 drink a day for nonpregnant women and 2 drinks a day for men. One drink equals 12 oz of beer, 5 oz of wine, or 1 oz of hard liquor.  Work with your health care provider to maintain a healthy body weight or to lose weight. Ask what an ideal weight is for you.  Get at least 30 minutes of exercise that causes your heart to beat faster (aerobic exercise) most days of the week. Activities may include walking, swimming, or biking.  Work with your health care provider or diet and nutrition specialist (dietitian) to adjust your eating plan to your individual calorie needs. Reading food labels  Check food labels for the amount of sodium per serving. Choose foods with less than 5 percent of the Daily Value of sodium. Generally, foods with less than 300 mg of sodium per serving fit into this eating plan.  To find whole grains, look for the word "whole" as the first word in the ingredient list. Shopping  Buy products labeled as "low-sodium" or "no salt added."  Buy fresh foods. Avoid canned foods and premade or frozen meals. Cooking  Avoid  adding salt when cooking. Use salt-free seasonings or herbs instead of table salt or sea salt. Check with your health care provider or pharmacist before using salt substitutes.  Do not fry foods. Cook foods using healthy methods such as baking, boiling, grilling, and broiling instead.  Cook with heart-healthy oils, such as olive, canola, soybean, or sunflower oil. Meal planning   Eat a balanced diet that includes: ? 5 or more servings of fruits and vegetables each day. At each meal, try to fill half of your plate with fruits and vegetables. ? Up to 6-8 servings of whole grains each day. ? Less than 6 oz of lean meat, poultry, or fish each day. A 3-oz serving of meat is about the same size as a deck of cards. One egg equals 1 oz. ? 2 servings of low-fat dairy each day. ? A serving of nuts, seeds, or beans 5 times each week. ? Heart-healthy fats. Healthy fats called Omega-3 fatty acids are found in foods such as flaxseeds and coldwater fish, like sardines, salmon, and mackerel.  Limit how much you eat of the following: ? Canned or prepackaged foods. ? Food that is high in trans fat, such as fried foods. ? Food that is high in saturated fat, such as fatty meat. ? Sweets, desserts, sugary drinks, and other foods with added sugar. ? Full-fat dairy products.  Do not salt foods before eating.  Try to eat at least 2 vegetarian meals each week.  Eat more home-cooked food and less restaurant, buffet, and fast food.  When eating at a restaurant, ask that your food be prepared with less salt or no salt, if possible. What foods are recommended? The items listed may not be a complete list. Talk with your dietitian about what dietary choices are best for you. Grains Whole-grain or whole-wheat bread. Whole-grain or whole-wheat pasta. Brown rice. [place]. Bulgur. Whole-grain and low-sodium cereals. Pita bread. Low-fat, low-sodium crackers. Whole-wheat flour tortillas. Vegetables Fresh or  frozen vegetables (raw, steamed, roasted, or grilled). Low-sodium or reduced-sodium tomato and vegetable juice. Low-sodium or reduced-sodium tomato sauce and tomato paste. Low-sodium or reduced-sodium canned vegetables. Fruits All fresh, dried, or frozen fruit. Canned fruit in natural juice (without added sugar). Meat and other protein foods Skinless chicken or [place]. Ground chicken or [place]. Pork with fat trimmed off. Fish and seafood. Egg whites. Dried beans, peas, or lentils. Unsalted nuts, nut butters, and seeds. Unsalted canned beans. Lean cuts of beef with fat trimmed off. Low-sodium, lean deli meat. Dairy Low-fat (1%) or fat-free (skim) milk. Fat-free, low-fat, or reduced-fat cheeses. Nonfat, low-sodium ricotta or cottage cheese. Low-fat or nonfat yogurt. Low-fat, low-sodium cheese. Fats and oils Soft margarine without trans fats. Vegetable oil. Low-fat, reduced-fat, or light mayonnaise and salad dressings (reduced-sodium). Canola, safflower, olive, soybean, and sunflower oils. Avocado. Seasoning and other foods Herbs. Spices. Seasoning mixes without salt. Unsalted popcorn and pretzels. Fat-free sweets. What foods are not recommended? The items listed may not be a complete list. Talk with your dietitian about what dietary choices are best for you. Grains Baked goods made with fat, such as croissants, muffins, or some breads. Dry pasta or rice meal packs. Vegetables Creamed or fried vegetables. Vegetables in a cheese sauce. Regular canned vegetables (not low-sodium or reduced-sodium). Regular canned tomato sauce and paste (not low-sodium or reduced-sodium). Regular tomato and vegetable juice (not low-sodium or reduced-sodium). Angie Fava. Olives. Fruits Canned fruit in a light or heavy syrup. Fried fruit. Fruit in cream or butter sauce. Meat and other protein foods Fatty cuts of meat. Ribs. Fried meat. Berniece Salines. Sausage. Bologna and other processed lunch meats. Salami. Fatback. Hotdogs.  Bratwurst. Salted nuts and seeds. Canned beans with added salt. Canned or smoked fish. Whole eggs or egg yolks. Chicken or [place] with skin. Dairy Whole or 2% milk, cream, and half-and-half. Whole or full-fat cream cheese. Whole-fat or sweetened yogurt. Full-fat cheese. Nondairy creamers. Whipped toppings. Processed cheese and cheese spreads. Fats and oils Butter. Stick margarine. Lard. Shortening. Ghee. Bacon fat. Tropical oils, such as coconut, palm kernel, or palm oil. Seasoning and other foods Salted popcorn and pretzels. Onion salt, garlic salt, seasoned salt, table salt, and sea salt. Worcestershire sauce. Tartar sauce. Barbecue sauce. Teriyaki sauce. Soy sauce, including reduced-sodium. Steak sauce. Canned and packaged gravies. Fish sauce. Oyster sauce. Cocktail sauce. Horseradish that you find on the shelf. Ketchup. Mustard. Meat flavorings and tenderizers. Bouillon cubes. Hot sauce and Tabasco sauce. Premade or packaged marinades. Premade or packaged taco seasonings. Relishes. Regular salad dressings. Where to find more information:  National Heart, Lung, and [place]: https://wilson-eaton.com/  American Heart Association: www.heart.org Summary  The DASH eating plan is a healthy eating plan that has been shown to reduce high blood pressure (hypertension). It may also reduce your risk for type 2 diabetes, heart disease, and stroke.  With the DASH eating plan, you should limit salt (sodium) intake to 2,300 mg a day. If  you have hypertension, you may need to reduce your sodium intake to 1,500 mg a day.  When on the DASH eating plan, aim to eat more fresh fruits and vegetables, whole grains, lean proteins, low-fat dairy, and heart-healthy fats.  Work with your health care provider or diet and nutrition specialist (dietitian) to adjust your eating plan to your individual calorie needs. This information is not intended to replace advice given to you by your health care provider. Make sure you  discuss any questions you have with your health care provider. Document Released: 05/25/2011 Document Revised: 05/29/2016 Document Reviewed: 05/29/2016 Elsevier Interactive Patient Education  [employer].

## 2018-03-12 NOTE — Progress Notes (Signed)
[facility]  Provider: Blanchie Serve MD   Location:  [place] of Service:  Clinic (12)  PCP: Blanchie Serve, MD Patient Care Team: Blanchie Serve, MD as PCP - General (Internal Medicine) Azucena Fallen, MD as Consulting Physician (Obstetrics and Gynecology) Chesley Mires, MD as Consulting Physician (Pulmonary Disease) Mast, Man X, NP as Nurse Practitioner (Internal Medicine) Christin Fudge, MD as Consulting Physician (Surgery)  Extended Emergency Contact Information Primary Emergency Contact: Boxman,Lee Address: [address]          [place], [place] [address] Johnnette Litter of [place] Phone: [phone] Mobile Phone: [phone] Relation: Daughter Secondary Emergency Contact: Ritta Slot [place] of [place] Mobile Phone: [phone] Relation: Daughter  Code Status: DNR  Goals of Care: Advanced Directive information Advanced Directives 12/18/2017  Does Patient Have a Medical Advance Directive? Yes  Type of Paramedic of [place];Living will;Out of facility DNR (pink MOST or yellow form)  Does patient want to make changes to medical advance directive? No - Patient declined  Copy of [place] in Chart? Yes  Would patient like information on creating a medical advance directive? -  Pre-existing out of facility DNR order (yellow form or pink MOST form) Pink MOST form placed in chart (order not valid for inpatient use);Yellow form placed in chart (order not valid for inpatient use)      Chief Complaint  Patient presents with  . Medical Management of Chronic Issues    3 mo f/u    HPI: Patient is a 84 y.o. female seen today for routine visit.   Hypokalemia- taking kcl 60 meq bid, denies leg cramps. Reviewed recent lab  gerd- takes omeprazole 20 mg daily, tolerating well, denies reflux symptom, occasionally requires zofran  Hypothyroidism- taking levothyroxine 125 mcg daily,  tolerating well, labs available for review  Lymphedema- takes metolazone 2.5 mg daily with lasix 60 mg daily. On kcl, improved and stable per pt, no drainage  Depression- with anxiety, sertraline 75 mg daily has been helpful. Also on xanax 0.5 mg bid prn, has rarely required it. Coping well with her husband's loss  Hypertension- on hctz 25 mg daily and losartan 100 mg daily. BP reading stable, denies headache, chest pain, dyspnea  Past Medical History:  Diagnosis Date  . ARTHRITIS, KNEES, BILATERAL   . Breast CA ([place]) 2000   s/p R lumpectomy and XRT  . DEPRESSION   . DJD (degenerative joint disease) of knee   . GERD   . HYPERLIPIDEMIA   . HYPERTENSION   . HYPOTHYROIDISM    postsurgical  . Hypoxia   . MIGRAINE HEADACHE   . Morbid obesity ([place])   . OSA (obstructive sleep apnea) 01/17/2011 dx  . Oxygen dependent   . Urge incontinence   . UTI (lower urinary tract infection) 12/2014   Past Surgical History:  Procedure Laterality Date  . ABDOMINAL HYSTERECTOMY    . ADENOIDECTOMY    . BREAST BIOPSY  2000  . BREAST LUMPECTOMY  2000  . CATARACT EXTRACTION    . CHOLECYSTECTOMY  1995  . CYSTOSCOPY/URETEROSCOPY/HOLMIUM LASER Right 06/28/2015   Procedure: CYSTOSCOPY RIGHT RETROGRAD RIGHT URETEROSCOPY/HOLMIUM LASER WITH RIGHT STENT PLACEMENT;  Surgeon: Alexis Frock, MD;  Location: [facility];  Service: Urology;  Laterality: Right;  . PARATHYROIDECTOMY     2-3 removed  . REPLACEMENT TOTAL KNEE BILATERAL  1999, 2005  . Right leg femur repaired    . THYROIDECTOMY    .  TONSILLECTOMY    . TUBAL LIGATION      reports that she quit smoking about 43 years ago. Her smoking use included cigarettes. She has a 0.50 pack-year smoking history. She has never used smokeless tobacco. She reports that she does not drink alcohol or use drugs. Social History   Socioeconomic History  . Marital status: Married    Spouse name: Not on file  . Number of children: 5  . Years of education: Not on file  .  Highest education level: Not on file  Occupational History  . Occupation: RETIRED    Employer: RETIRED    Comment: worked on IT consultant  . Financial resource strain: Not hard at all  . Food insecurity:    Worry: Never true    Inability: Never true  . Transportation needs:    Medical: No    Non-medical: No  Tobacco Use  . Smoking status: Former Smoker    Packs/day: 0.25    Years: 2.00    Pack years: 0.50    Types: Cigarettes    Last attempt to quit: 06/19/1974    Years since quitting: 43.7  . Smokeless tobacco: Never Used  . Tobacco comment: Quit in late 20's  Substance and Sexual Activity  . Alcohol use: No    Alcohol/week: 0.0 standard drinks    Comment: rare  . Drug use: No  . Sexual activity: Never  Lifestyle  . Physical activity:    Days per week: 0 days    Minutes per session: 0 min  . Stress: Only a little  Relationships  . Social connections:    Talks on phone: More than three times a week    Gets together: Once a week    Attends religious service: More than 4 times per year    Active member of club or organization: Yes    Attends meetings of clubs or organizations: 1 to 4 times per year    Relationship status: Married  . Intimate partner violence:    Fear of current or ex partner: No    Emotionally abused: No    Physically abused: No    Forced sexual activity: No  Other Topics Concern  . Not on file  Social History Narrative   Married to husband that has dementia (he lives in the skilled nursing wing) and this is a big stressor. 5 children. 10 grandchildren. 2 greatgrandchildren. All children [place]      Lives at [facility], in independent living.      Retired from multiple different Diplomatic Services operational officer, university, Doctor, hospital.       Hobbies: Therapist, music, write poetry, craft dolls    Functional Status Survey:    Family History  Problem Relation Age of Onset  . Emphysema Sister   . Coronary artery disease Neg Hx     Health  Maintenance  Topic Date Due  . DEXA SCAN  09/07/1997  . INFLUENZA VACCINE  01/17/2018  . TETANUS/TDAP  10/05/2019  . PNA vac Low Risk Adult  Completed    Allergies  Allergen Reactions  . Azithromycin Itching  . Beta Adrenergic Blockers     Depression   . Ciprofloxacin Other (See Comments)    REACTION: edgy and very jumpy   . Codeine Nausea And Vomiting  . Epinephrine Other (See Comments)    REACTION: rapid pulse, sweats  . Klonopin [Clonazepam] Other (See Comments)    Makes her feel very suicidal   . Oxycodone Other (See  Comments)    Patient felt like it altered her mental status "Crazy" . Hallucinations later as well.  [name] Paxil [Paroxetine Hydrochloride] Other (See Comments)    Sever depression   . Prednisone     "makes me feel really bad"  . Propoxyphene Hcl Nausea And Vomiting  . Torsemide   . Tramadol Other (See Comments)    Feels "jittery"  . Vicodin [Hydrocodone-Acetaminophen] Other (See Comments)    Hallucinations  . Meloxicam Diarrhea  . Vancomycin Rash    Localized rash related to infusion rate.    Outpatient Encounter Medications as of 03/12/2018  Medication Sig  . acetaminophen (TYLENOL) 325 MG tablet Take 650 mg by mouth every 8 (eight) hours as needed for mild pain or moderate pain.   [name] ALPRAZolam (XANAX) 0.5 MG tablet TAKE 1 TABLET TWICE DAILY AS NEEDED FOR ANXIETY.  [name] aspirin 81 MG tablet Take 81 mg by mouth daily.   . Calcium Polycarbophil (FIBER-CAPS PO) Take 1 capsule by mouth as needed.  . furosemide (LASIX) 40 MG tablet Take 1.5 tablets (60 mg total) by mouth daily. Along with metolazone and potassium supplement  . hydrochlorothiazide (HYDRODIURIL) 25 MG tablet TAKE 1 TABLET EACH DAY.  [name] levothyroxine (SYNTHROID, LEVOTHROID) 125 MCG tablet Take 1 tablet (125 mcg total) by mouth daily.  [name] loratadine (CLARITIN) 10 MG tablet Take 10 mg by mouth daily as needed for allergies.  [name] losartan (COZAAR) 100 MG tablet TAKE 1 TABLET ONCE DAILY. HOLD FOR SYSTOLIC  BLOOD PRESSURE LESS THAN 110.  . metolazone (ZAROXOLYN) 2.5 MG tablet Take 1 tablet (2.5 mg total) by mouth daily.  [name] omeprazole (PRILOSEC) 20 MG capsule TAKE (1) CAPSULE DAILY.  [name] ondansetron (ZOFRAN) 4 MG tablet TAKE (1) TABLET BY MOUTH EVERY SIX HOURS AS NEEDED FOR NAUSEA.  [name] potassium chloride (KLOR-CON) 20 MEQ packet Take 60 mEq by mouth 2 (two) times daily.   . sertraline (ZOLOFT) 50 MG tablet Take 1 tablet (50 mg total) by mouth daily. With 25 mg daily for total of 75 mg daily  . [DISCONTINUED] azithromycin (ZITHROMAX) 250 MG tablet Taking 2 tabs equal to $Remove'500mg'xETgdWF$  for day 1, then I tab $Rem'250mg'OOMJ$  qd for day 2 through day 5  . [DISCONTINUED] potassium chloride SA (K-DUR,KLOR-CON) 20 MEQ tablet TAKE 3 TABLETS TWICE DAILY.  . [DISCONTINUED] sertraline (ZOLOFT) 25 MG tablet Take 25 mg by mouth daily. Take 1 tablet (25 mg) with 1 tablet (50 mg) to equal 75 mg.   No facility-administered encounter medications on file as of 03/12/2018.     Review of Systems  Constitutional: Negative for appetite change, chills, fatigue and fever.  HENT: Positive for hearing loss and postnasal drip. Negative for congestion, ear discharge, ear pain, sore throat and trouble swallowing.   Eyes: Positive for visual disturbance. Negative for pain and itching.       Seeing her eye doctor this afternoon for routine visit  Respiratory: Positive for cough. Negative for shortness of breath.        Occasional wet cough with some post nasal drip. Denies coughing up phlegm. Breathing is stable  Cardiovascular: Positive for leg swelling. Negative for chest pain and palpitations.       Stable leg edema  Gastrointestinal: Negative for abdominal pain, blood in stool, constipation, diarrhea, nausea and vomiting.  Genitourinary: Positive for frequency. Negative for dysuria, hematuria and urgency.       Has episodes of urinary incontinence  Musculoskeletal: Positive for gait problem. Negative for arthralgias and back pain.  Skin:  Negative for rash and wound.  Neurological: Negative for dizziness, tremors, numbness and headaches.  Psychiatric/Behavioral: Negative for behavioral problems, decreased concentration, dysphoric mood and sleep disturbance.    Vitals:   03/12/18 0929  BP: 132/74  Pulse: 93  Resp: 20  Temp: 97.7 F (36.5 C)  TempSrc: Oral  SpO2: 99%  Weight: 297 lb 3.2 oz (134.8 kg)  Height: $Remove'5\' 1"'qIJVwrB$  (1.549 m)   Body mass index is 56.16 kg/m.   Wt Readings from Last 3 Encounters:  03/12/18 297 lb 3.2 oz (134.8 kg)  01/24/18 298 lb 12.8 oz (135.5 kg)  12/18/17 294 lb 6.4 oz (133.5 kg)   Physical Exam  Constitutional: She is oriented to person, place, and time. No distress.  Morbidly obese elderly female  HENT:  Head: Normocephalic and atraumatic.  Mouth/Throat: Oropharynx is clear and moist.  Eyes: Pupils are equal, round, and reactive to light. Conjunctivae and EOM are normal. Right eye exhibits no discharge. Left eye exhibits no discharge.  Neck: Normal range of motion. Neck supple.  Cardiovascular: Normal rate and regular rhythm.  Pulmonary/Chest: Effort normal and breath sounds normal. No respiratory distress. She has no wheezes. She has no rales.  Abdominal: Soft. Bowel sounds are normal. There is no tenderness. There is no guarding.  Musculoskeletal: She exhibits edema. She exhibits no tenderness.  Lymphedema with chronic stasis changes to legs, wheelchair for mobility  Lymphadenopathy:    She has no cervical adenopathy.  Neurological: She is alert and oriented to person, place, and time. She exhibits normal muscle tone.  Skin: Skin is warm and dry. She is not diaphoretic.  Psychiatric: She has a normal mood and affect.    Labs reviewed: Basic Metabolic Panel: Recent Labs    07/17/17 0825  01/01/18 0715 01/10/18 0000 03/05/18 0745  NA  --    < > 142 141 140  K  --    < > 4.0 3.7 3.3*  CL  --    < > 100 99 100  CO2  --    < > 36* 33* 31  GLUCOSE  --    < > 115* 86 112*  BUN   --    < > $R'17 22 20  'PA$ CREATININE  --    < > 0.86 0.89* 0.95*  CALCIUM  --    < > 8.0* 8.1* 8.1*  MG 2.5*  --  1.8 1.7  --    < > = values in this interval not displayed.   Liver Function Tests: Recent Labs    04/03/17  07/16/17 1109 07/17/17 0152 07/26/17 0745 12/11/17 0000 03/05/18 0745  AST 16   < > $R'20 18 12 13 14  14  'Ls$ ALT 10   < > 13* 13* $Remov'10 11 9  9  'QEHGNh$ ALKPHOS 86  --  96 82  --   --   --   BILITOT  --    < > 0.7 0.6 0.5 0.5 0.5  0.5  PROT  --    < > 6.8 6.1* 6.1 6.3 6.1  6.1  ALBUMIN  --   --  3.2* 2.9*  --   --   --    < > = values in this interval not displayed.   Recent Labs    07/16/17 1109  LIPASE 22   No results for input(s): AMMONIA in the last 8760 hours. CBC: Recent Labs    07/16/17 1109 07/17/17 0152 07/26/17 0745 03/05/18 0745  WBC 6.9 8.3  7.2 6.5  NEUTROABS 5.3  --  5,162  --   HGB 12.8 13.0 13.2 12.8  HCT 39.3 38.3 38.5 38.4  MCV 88.5 86.8 84.6 87.1  PLT 165 143* 159 150   Cardiac Enzymes: Recent Labs    07/16/17 2030 07/17/17 0152 07/17/17 0825  TROPONINI <0.03 <0.03 <0.03   BNP: Invalid input(s): POCBNP Lab Results  Component Value Date   HGBA1C 6.6 (H) 03/05/2018   Lab Results  Component Value Date   TSH 6.51 (H) 03/05/2018   No results found for: VITAMINB12 No results found for: FOLATE No results found for: IRON, TIBC, FERRITIN  Lipid Panel: Recent Labs    06/26/17 0655 03/05/18 0745  CHOL 171 153  HDL 45* 45*  LDLCALC 104* 86  TRIG 128 127  CHOLHDL 3.8 3.4   Lab Results  Component Value Date   HGBA1C 6.6 (H) 03/05/2018    Procedures since last visit: No results found.  Assessment/Plan  1. Hypokalemia Low k, asymptomatic, continue kcl 60 meq bid for now, reduce hctz to 12.5 mg daily, check bmp and mg in 1 week.   2. CKD (chronic kidney disease) stage 3, GFR 30-59 ml/min (HCC) Reviewed bmp, monitor, maintain hydration  3. Hypothyroidism (acquired) Elevated TSH but improved from before. Normal free T4.  Continue current regimen of levothyroxine and recheck tsh in 3 months  4. Essential hypertension Continue losartan, reduce hctz to 12.5 mg daily from 25 daily with stable bp and hypokalemia. Check bmp  5. Depression with anxiety Continue sertraline and monitor  6. Chronic acquired lymphedema Stable, continue lasix and metolazone. Skin care  7. Hyperlipidemia LDL goal <70 Borderline low HDL, rest of lipid panel stable. monitor  8. Diabetes mellitus type 2 in obese (HCC) a1c s/o diabetes. Pt refuses medication to be initiated at present. Want to try dietary modification, DASH plan reading material provided. Recheck a1c in 3 months and if > 6.5, consider metformin  9. Gastroesophageal reflux disease without esophagitis Continue PPI.     Labs/tests ordered:  BMP and Mg in 1 week, CMP, A1c, TSH in 3 months  Next appointment: 3 months  Communication: reviewed care plan with patient      Blanchie Serve, MD Internal Medicine [place],  [address] Cell Phone (Monday-Friday 8 am - 5 pm): [phone] On Call: [phone] and follow prompts after 5 pm and on weekends Office Phone: [phone] Office Fax: [phone]

## 2018-03-14 ENCOUNTER — Emergency Department (HOSPITAL_COMMUNITY): Payer: Medicare Other

## 2018-03-14 ENCOUNTER — Encounter (HOSPITAL_COMMUNITY): Payer: Self-pay | Admitting: Emergency Medicine

## 2018-03-14 ENCOUNTER — Other Ambulatory Visit: Payer: Self-pay

## 2018-03-14 ENCOUNTER — Inpatient Hospital Stay (HOSPITAL_COMMUNITY)
Admission: EM | Admit: 2018-03-14 | Discharge: 2018-03-18 | DRG: 292 | Disposition: A | Discharge status: Home Health | Payer: Medicare Other | Attending: Internal Medicine | Admitting: Internal Medicine

## 2018-03-14 DIAGNOSIS — Z885 Allergy status to narcotic agent status: Secondary | ICD-10-CM | POA: Diagnosis not present

## 2018-03-14 DIAGNOSIS — Z886 Allergy status to analgesic agent status: Secondary | ICD-10-CM

## 2018-03-14 DIAGNOSIS — Z881 Allergy status to other antibiotic agents status: Secondary | ICD-10-CM

## 2018-03-14 DIAGNOSIS — F329 Major depressive disorder, single episode, unspecified: Secondary | ICD-10-CM | POA: Diagnosis present

## 2018-03-14 DIAGNOSIS — Z7982 Long term (current) use of aspirin: Secondary | ICD-10-CM

## 2018-03-14 DIAGNOSIS — I509 Heart failure, unspecified: Secondary | ICD-10-CM

## 2018-03-14 DIAGNOSIS — Z23 Encounter for immunization: Secondary | ICD-10-CM

## 2018-03-14 DIAGNOSIS — Z9981 Dependence on supplemental oxygen: Secondary | ICD-10-CM | POA: Diagnosis not present

## 2018-03-14 DIAGNOSIS — K219 Gastro-esophageal reflux disease without esophagitis: Secondary | ICD-10-CM | POA: Diagnosis present

## 2018-03-14 DIAGNOSIS — N390 Urinary tract infection, site not specified: Secondary | ICD-10-CM

## 2018-03-14 DIAGNOSIS — I890 Lymphedema, not elsewhere classified: Secondary | ICD-10-CM | POA: Diagnosis present

## 2018-03-14 DIAGNOSIS — I110 Hypertensive heart disease with heart failure: Principal | ICD-10-CM | POA: Diagnosis present

## 2018-03-14 DIAGNOSIS — E785 Hyperlipidemia, unspecified: Secondary | ICD-10-CM | POA: Diagnosis present

## 2018-03-14 DIAGNOSIS — Z6841 Body Mass Index (BMI) 40.0 and over, adult: Secondary | ICD-10-CM

## 2018-03-14 DIAGNOSIS — E119 Type 2 diabetes mellitus without complications: Secondary | ICD-10-CM | POA: Diagnosis present

## 2018-03-14 DIAGNOSIS — Z888 Allergy status to other drugs, medicaments and biological substances status: Secondary | ICD-10-CM

## 2018-03-14 DIAGNOSIS — G4733 Obstructive sleep apnea (adult) (pediatric): Secondary | ICD-10-CM | POA: Diagnosis present

## 2018-03-14 DIAGNOSIS — Z853 Personal history of malignant neoplasm of breast: Secondary | ICD-10-CM

## 2018-03-14 DIAGNOSIS — Z90710 Acquired absence of both cervix and uterus: Secondary | ICD-10-CM

## 2018-03-14 DIAGNOSIS — E039 Hypothyroidism, unspecified: Secondary | ICD-10-CM | POA: Diagnosis present

## 2018-03-14 DIAGNOSIS — I959 Hypotension, unspecified: Secondary | ICD-10-CM | POA: Diagnosis not present

## 2018-03-14 DIAGNOSIS — I340 Nonrheumatic mitral (valve) insufficiency: Secondary | ICD-10-CM | POA: Diagnosis not present

## 2018-03-14 DIAGNOSIS — Z79899 Other long term (current) drug therapy: Secondary | ICD-10-CM

## 2018-03-14 DIAGNOSIS — R0602 Shortness of breath: Secondary | ICD-10-CM | POA: Diagnosis not present

## 2018-03-14 DIAGNOSIS — M170 Bilateral primary osteoarthritis of knee: Secondary | ICD-10-CM | POA: Diagnosis present

## 2018-03-14 DIAGNOSIS — E876 Hypokalemia: Secondary | ICD-10-CM | POA: Diagnosis present

## 2018-03-14 DIAGNOSIS — Z79890 Hormone replacement therapy (postmenopausal): Secondary | ICD-10-CM

## 2018-03-14 DIAGNOSIS — I10 Essential (primary) hypertension: Secondary | ICD-10-CM | POA: Diagnosis present

## 2018-03-14 DIAGNOSIS — I5033 Acute on chronic diastolic (congestive) heart failure: Secondary | ICD-10-CM | POA: Diagnosis present

## 2018-03-14 DIAGNOSIS — Z9049 Acquired absence of other specified parts of digestive tract: Secondary | ICD-10-CM | POA: Diagnosis not present

## 2018-03-14 DIAGNOSIS — F419 Anxiety disorder, unspecified: Secondary | ICD-10-CM | POA: Diagnosis present

## 2018-03-14 DIAGNOSIS — R0902 Hypoxemia: Secondary | ICD-10-CM | POA: Diagnosis not present

## 2018-03-14 DIAGNOSIS — Z87891 Personal history of nicotine dependence: Secondary | ICD-10-CM

## 2018-03-14 DIAGNOSIS — F418 Other specified anxiety disorders: Secondary | ICD-10-CM | POA: Diagnosis present

## 2018-03-14 DIAGNOSIS — R531 Weakness: Secondary | ICD-10-CM | POA: Diagnosis not present

## 2018-03-14 LAB — BASIC METABOLIC PANEL
ANION GAP: 12 (ref 5–15)
BUN: 24 mg/dL — AB (ref 8–23)
CALCIUM: 8.1 mg/dL — AB (ref 8.9–10.3)
CO2: 34 mmol/L — AB (ref 22–32)
Chloride: 95 mmol/L — ABNORMAL LOW (ref 98–111)
Creatinine, Ser: 1.16 mg/dL — ABNORMAL HIGH (ref 0.44–1.00)
GFR calc Af Amer: 48 mL/min — ABNORMAL LOW (ref >60)
GFR, EST NON AFRICAN AMERICAN: 42 mL/min — AB (ref >60)
GLUCOSE: 138 mg/dL — AB (ref 70–99)
Potassium: 3 mmol/L — ABNORMAL LOW (ref 3.5–5.1)
Sodium: 141 mmol/L (ref 135–145)

## 2018-03-14 LAB — HEPATIC FUNCTION PANEL
ALT: 15 U/L (ref 0–44)
AST: 21 U/L (ref 15–41)
Albumin: 3.4 g/dL — ABNORMAL LOW (ref 3.5–5.0)
Alkaline Phosphatase: 90 U/L (ref 38–126)
BILIRUBIN DIRECT: 0.1 mg/dL (ref 0.0–0.2)
BILIRUBIN TOTAL: 0.6 mg/dL (ref 0.3–1.2)
Indirect Bilirubin: 0.5 mg/dL (ref 0.3–0.9)
Total Protein: 6.8 g/dL (ref 6.5–8.1)

## 2018-03-14 LAB — URINALYSIS, ROUTINE W REFLEX MICROSCOPIC
Bilirubin Urine: NEGATIVE
Glucose, UA: NEGATIVE mg/dL
Hgb urine dipstick: NEGATIVE
KETONES UR: NEGATIVE mg/dL
NITRITE: NEGATIVE
PH: 5 (ref 5.0–8.0)
PROTEIN: NEGATIVE mg/dL
Specific Gravity, Urine: 1.008 (ref 1.005–1.030)

## 2018-03-14 LAB — CBC
HCT: 40.7 % (ref 36.0–46.0)
HEMOGLOBIN: 13.1 g/dL (ref 12.0–15.0)
MCH: 29.8 pg (ref 26.0–34.0)
MCHC: 32.2 g/dL (ref 30.0–36.0)
MCV: 92.5 fL (ref 78.0–100.0)
Platelets: 151 10*3/uL (ref 150–400)
RBC: 4.4 MIL/uL (ref 3.87–5.11)
RDW: 15.1 % (ref 11.5–15.5)
WBC: 7.8 10*3/uL (ref 4.0–10.5)

## 2018-03-14 LAB — I-STAT CG4 LACTIC ACID, ED: LACTIC ACID, VENOUS: 1.66 mmol/L (ref 0.5–1.9)

## 2018-03-14 LAB — I-STAT TROPONIN, ED: Troponin i, poc: 0 ng/mL (ref 0.00–0.08)

## 2018-03-14 LAB — CBG MONITORING, ED: Glucose-Capillary: 126 mg/dL — ABNORMAL HIGH (ref 70–99)

## 2018-03-14 LAB — BRAIN NATRIURETIC PEPTIDE: B NATRIURETIC PEPTIDE 5: 137.6 pg/mL — AB (ref 0.0–100.0)

## 2018-03-14 MED ORDER — SERTRALINE HCL 50 MG PO TABS
50.0000 mg | ORAL_TABLET | Freq: Every day | ORAL | Status: DC
  Administered 2018-03-15 – 2018-03-18 (×4): 50 mg via ORAL
  Filled 2018-03-14 (×4): qty 1

## 2018-03-14 MED ORDER — SODIUM CHLORIDE 0.9% FLUSH
3.0000 mL | Freq: Two times a day (BID) | INTRAVENOUS | Status: DC
  Administered 2018-03-14 – 2018-03-15 (×3): 3 mL via INTRAVENOUS
  Administered 2018-03-16: 22:00:00 via INTRAVENOUS
  Administered 2018-03-16 – 2018-03-18 (×4): 3 mL via INTRAVENOUS

## 2018-03-14 MED ORDER — LOSARTAN POTASSIUM 50 MG PO TABS
100.0000 mg | ORAL_TABLET | Freq: Every day | ORAL | Status: DC
  Administered 2018-03-15 – 2018-03-18 (×4): 100 mg via ORAL
  Filled 2018-03-14 (×4): qty 2

## 2018-03-14 MED ORDER — LEVOTHYROXINE SODIUM 125 MCG PO TABS: 125.0000 ug | ORAL_TABLET | Freq: Every day | ORAL | Status: DC

## 2018-03-14 MED ORDER — METOLAZONE 2.5 MG PO TABS
2.5000 mg | ORAL_TABLET | Freq: Every day | ORAL | Status: DC
  Administered 2018-03-15 – 2018-03-18 (×4): 2.5 mg via ORAL
  Filled 2018-03-14 (×4): qty 1

## 2018-03-14 MED ORDER — HEPARIN SODIUM (PORCINE) 5000 UNIT/ML IJ SOLN
5000.0000 [IU] | Freq: Three times a day (TID) | INTRAMUSCULAR | Status: DC
  Administered 2018-03-15 – 2018-03-18 (×11): 5000 [IU] via SUBCUTANEOUS
  Filled 2018-03-14 (×11): qty 1

## 2018-03-14 MED ORDER — SODIUM CHLORIDE 0.9% FLUSH: 3.0000 mL | INTRAVENOUS | Status: DC | PRN

## 2018-03-14 MED ORDER — POTASSIUM CHLORIDE CRYS ER 20 MEQ PO TBCR
60.0000 meq | EXTENDED_RELEASE_TABLET | Freq: Two times a day (BID) | ORAL | Status: DC
  Administered 2018-03-15 – 2018-03-18 (×8): 60 meq via ORAL
  Filled 2018-03-14 (×9): qty 3

## 2018-03-14 MED ORDER — PANTOPRAZOLE SODIUM 40 MG PO TBEC
40.0000 mg | DELAYED_RELEASE_TABLET | Freq: Every day | ORAL | Status: DC
  Administered 2018-03-15 – 2018-03-18 (×4): 40 mg via ORAL
  Filled 2018-03-14 (×4): qty 1

## 2018-03-14 MED ORDER — POTASSIUM CHLORIDE 10 MEQ/100ML IV SOLN: 10.0000 meq | Freq: Once | INTRAVENOUS | Status: DC

## 2018-03-14 MED ORDER — LEVOTHYROXINE SODIUM 25 MCG PO TABS
125.0000 ug | ORAL_TABLET | Freq: Every day | ORAL | Status: DC
  Administered 2018-03-15 – 2018-03-18 (×4): 125 ug via ORAL
  Filled 2018-03-14 (×4): qty 1

## 2018-03-14 MED ORDER — SODIUM CHLORIDE 0.9 % IV SOLN: 250.0000 mL | INTRAVENOUS | Status: DC | PRN

## 2018-03-14 MED ORDER — ACETAMINOPHEN 325 MG PO TABS
650.0000 mg | ORAL_TABLET | ORAL | Status: DC | PRN
  Administered 2018-03-15: 650 mg via ORAL
  Filled 2018-03-14: qty 2

## 2018-03-14 MED ORDER — LORATADINE 10 MG PO TABS
10.0000 mg | ORAL_TABLET | Freq: Every day | ORAL | Status: DC
  Administered 2018-03-15 – 2018-03-17 (×4): 10 mg via ORAL
  Filled 2018-03-14 (×4): qty 1

## 2018-03-14 MED ORDER — SODIUM CHLORIDE 0.9 % IV BOLUS: 1000.0000 mL | Freq: Once | INTRAVENOUS | Status: DC

## 2018-03-14 MED ORDER — ALPRAZOLAM 0.25 MG PO TABS
0.2500 mg | ORAL_TABLET | Freq: Every evening | ORAL | Status: DC | PRN
  Administered 2018-03-15 – 2018-03-17 (×4): 0.25 mg via ORAL
  Filled 2018-03-14 (×4): qty 1

## 2018-03-14 MED ORDER — ONDANSETRON HCL 4 MG PO TABS
4.0000 mg | ORAL_TABLET | Freq: Four times a day (QID) | ORAL | Status: DC | PRN
  Administered 2018-03-16: 4 mg via ORAL
  Filled 2018-03-14: qty 1

## 2018-03-14 MED ORDER — ACETAMINOPHEN 325 MG PO TABS: 650.0000 mg | ORAL_TABLET | Freq: Three times a day (TID) | ORAL | Status: DC | PRN

## 2018-03-14 MED ORDER — FUROSEMIDE 10 MG/ML IJ SOLN
80.0000 mg | Freq: Once | INTRAMUSCULAR | Status: AC
  Administered 2018-03-14: 80 mg via INTRAVENOUS
  Filled 2018-03-14: qty 8

## 2018-03-14 MED ORDER — FUROSEMIDE 10 MG/ML IJ SOLN
40.0000 mg | Freq: Two times a day (BID) | INTRAMUSCULAR | Status: DC
  Administered 2018-03-15 – 2018-03-16 (×3): 40 mg via INTRAVENOUS
  Filled 2018-03-14 (×3): qty 4

## 2018-03-14 MED ORDER — ONDANSETRON HCL 4 MG/2ML IJ SOLN
4.0000 mg | Freq: Four times a day (QID) | INTRAMUSCULAR | Status: DC | PRN
  Administered 2018-03-15 (×2): 4 mg via INTRAVENOUS
  Filled 2018-03-14 (×2): qty 2

## 2018-03-14 MED ORDER — SODIUM CHLORIDE 0.9 % IV SOLN
1.0000 g | Freq: Once | INTRAVENOUS | Status: AC
  Administered 2018-03-14: 1 g via INTRAVENOUS
  Filled 2018-03-14: qty 10

## 2018-03-14 MED ORDER — ASPIRIN EC 81 MG PO TBEC
81.0000 mg | DELAYED_RELEASE_TABLET | Freq: Every day | ORAL | Status: DC
  Administered 2018-03-15 – 2018-03-18 (×4): 81 mg via ORAL
  Filled 2018-03-14 (×4): qty 1

## 2018-03-14 NOTE — ED Notes (Signed)
ED TO INPATIENT HANDOFF REPORT  Name/Age/Gender Tamara Morales 84 y.o. female  Code Status Code Status History    Date Active Date Inactive Code Status Order ID Comments User Context   07/16/2017 2011 07/18/2017 1500 DNR [id number]  Oswald Hillock, MD Inpatient   06/27/2015 1859 07/02/2015 1601 Full Code [id number]  Theodis Blaze, MD Inpatient   01/07/2015 2052 01/10/2015 1922 Full Code [id number]  Lavina Hamman, MD ED   09/05/2013 2314 09/06/2013 1451 Full Code [id number]  Mirna Mires, MD ED   08/21/2013 1404 08/23/2013 1429 Full Code [id number]  Theodis Blaze, MD Inpatient   11/29/2012 2346 12/01/2012 1910 Full Code [id number]  Theressa Millard, MD ED    Questions for Most Recent Historical Code Status (Order [id number])    Question Answer Comment   In the event of cardiac or respiratory ARREST Do not call a "code blue"    In the event of cardiac or respiratory ARREST Do not perform Intubation, CPR, defibrillation or ACLS    In the event of cardiac or respiratory ARREST Use medication by any route, position, wound care, and other measures to relive pain and suffering. May use oxygen, suction and manual treatment of airway obstruction as needed for comfort.         Advance Directive Documentation     Most Recent Value  Type of Advance Directive  Out of facility DNR (pink MOST or yellow form)  Pre-existing out of facility DNR order (yellow form or pink MOST form)  Pink MOST form placed in chart (order not valid for inpatient use), Yellow form placed in chart (order not valid for inpatient use)  "MOST" Form in Place?  -      Home/SNF/Other Nursing Home  Chief Complaint weakness / short of breath   Level of Care/Admitting Diagnosis ED Disposition    ED Disposition Condition [place]: [facility] [[id number]]  Level of Care: Telemetry [5]  Admit to tele based on following criteria: Acute CHF  Diagnosis: Acute on chronic diastolic (congestive)  heart failure ([place]) [[id number]]  Admitting Physician: Elwyn Reach [[id number]]  Attending Physician: Elwyn Reach [[id number]]  Estimated length of stay: past midnight tomorrow  Certification:: I certify this patient will need inpatient services for at least 2 midnights  PT Class (Do Not Modify): Inpatient [101]  PT Acc Code (Do Not Modify): Private [1]       Medical History Past Medical History:  Diagnosis Date  . ARTHRITIS, KNEES, BILATERAL   . Breast CA ([place]) 2000   s/p R lumpectomy and XRT  . DEPRESSION   . DJD (degenerative joint disease) of knee   . GERD   . HYPERLIPIDEMIA   . HYPERTENSION   . HYPOTHYROIDISM    postsurgical  . Hypoxia   . MIGRAINE HEADACHE   . Morbid obesity ([place])   . OSA (obstructive sleep apnea) 01/17/2011 dx  . Oxygen dependent   . Urge incontinence   . UTI (lower urinary tract infection) 12/2014    Allergies Allergies  Allergen Reactions  . Azithromycin Itching  . Beta Adrenergic Blockers     Depression   . Ciprofloxacin Other (See Comments)    REACTION: edgy and very jumpy   . Codeine Nausea And Vomiting  . Epinephrine Other (See Comments)    REACTION: rapid pulse, sweats  . Klonopin [Clonazepam] Other (See Comments)    Makes her feel very suicidal   . Oxycodone Other (  See Comments)    Patient felt like it altered her mental status "Crazy" . Hallucinations later as well.  [name] Paxil [Paroxetine Hydrochloride] Other (See Comments)    Sever depression   . Prednisone     "makes me feel really bad"  . Propoxyphene Hcl Nausea And Vomiting  . Torsemide   . Tramadol Other (See Comments)    Feels "jittery"  . Vicodin [Hydrocodone-Acetaminophen] Other (See Comments)    Hallucinations  . Meloxicam Diarrhea  . Vancomycin Rash    Localized rash related to infusion rate.    IV Location/Drains/Wounds Patient Lines/Drains/Airways Status   Active Line/Drains/Airways    Name:   Placement date:   Placement time:   Site:   Days:   Peripheral IV  03/14/18 Left Hand   03/14/18    1907    Hand   less than 1   Ureteral Drain/Stent Right ureter 5 Fr.   06/28/15    2117    Right ureter   990   External Urinary Catheter   07/17/17    1530    -   240          Labs/Imaging Results for orders placed or performed during the hospital encounter of 03/14/18 (from the past 48 hour(s))  CBG monitoring, ED     Status: Abnormal   Collection Time: 03/14/18  4:57 PM  Result Value Ref Range   Glucose-Capillary 126 (H) 70 - 99 mg/dL  Basic metabolic panel     Status: Abnormal   Collection Time: 03/14/18  4:58 PM  Result Value Ref Range   Sodium 141 135 - 145 mmol/L   Potassium 3.0 (L) 3.5 - 5.1 mmol/L   Chloride 95 (L) 98 - 111 mmol/L   CO2 34 (H) 22 - 32 mmol/L   Glucose, Bld 138 (H) 70 - 99 mg/dL   BUN 24 (H) 8 - 23 mg/dL   Creatinine, Ser 1.16 (H) 0.44 - 1.00 mg/dL   Calcium 8.1 (L) 8.9 - 10.3 mg/dL   GFR calc non Af Amer 42 (L) >60 mL/min   GFR calc Af Amer 48 (L) >60 mL/min    Comment: (NOTE) The eGFR has been calculated using the CKD EPI equation. This calculation has not been validated in all clinical situations. eGFR's persistently <60 mL/min signify possible Chronic Kidney Disease.    Anion gap 12 5 - 15    Comment: Performed at [facility], [place] [address] [place], [place] [address]  CBC     Status: None   Collection Time: 03/14/18  4:58 PM  Result Value Ref Range   WBC 7.8 4.0 - 10.5 K/uL   RBC 4.40 3.87 - 5.11 MIL/uL   Hemoglobin 13.1 12.0 - 15.0 g/dL   HCT 40.7 36.0 - 46.0 %   MCV 92.5 78.0 - 100.0 fL   MCH 29.8 26.0 - 34.0 pg   MCHC 32.2 30.0 - 36.0 g/dL   RDW 15.1 11.5 - 15.5 %   Platelets 151 150 - 400 K/uL    Comment: Performed at [facility], [place] [address], [place], [place] [address]  Brain natriuretic peptide     Status: Abnormal   Collection Time: 03/14/18  5:01 PM  Result Value Ref Range   B Natriuretic Peptide 137.6 (H) 0.0 - 100.0 pg/mL    Comment: Performed at  [facility], [place] [address] [place], [place] [address]  Hepatic function panel     Status: Abnormal  Collection Time: 03/14/18  5:01 PM  Result Value Ref Range   Total Protein 6.8 6.5 - 8.1 g/dL   Albumin 3.4 (L) 3.5 - 5.0 g/dL   AST 21 15 - 41 U/L   ALT 15 0 - 44 U/L   Alkaline Phosphatase 90 38 - 126 U/L   Total Bilirubin 0.6 0.3 - 1.2 mg/dL   Bilirubin, Direct 0.1 0.0 - 0.2 mg/dL   Indirect Bilirubin 0.5 0.3 - 0.9 mg/dL    Comment: Performed at [facility], [place] [address] [place], [place] [address]  I-Stat Troponin, ED (not at [facility])     Status: None   Collection Time: 03/14/18  5:09 PM  Result Value Ref Range   Troponin i, poc 0.00 0.00 - 0.08 ng/mL   Comment 3            Comment: Due to the release kinetics of cTnI, a negative result within the first hours of the onset of symptoms does not rule out myocardial infarction with certainty. If myocardial infarction is still suspected, repeat the test at appropriate intervals.   I-Stat CG4 Lactic Acid, ED     Status: None   Collection Time: 03/14/18  5:10 PM  Result Value Ref Range   Lactic Acid, Venous 1.66 0.5 - 1.9 mmol/L  Urinalysis, Routine w reflex microscopic     Status: Abnormal   Collection Time: 03/14/18  6:36 PM  Result Value Ref Range   Color, Urine STRAW (A) YELLOW   APPearance CLEAR CLEAR   Specific Gravity, Urine 1.008 1.005 - 1.030   pH 5.0 5.0 - 8.0   Glucose, UA NEGATIVE NEGATIVE mg/dL   Hgb urine dipstick NEGATIVE NEGATIVE   Bilirubin Urine NEGATIVE NEGATIVE   Ketones, ur NEGATIVE NEGATIVE mg/dL   Protein, ur NEGATIVE NEGATIVE mg/dL   Nitrite NEGATIVE NEGATIVE   Leukocytes, UA MODERATE (A) NEGATIVE   RBC / HPF 0-5 0 - 5 RBC/hpf   WBC, UA 21-50 0 - 5 WBC/hpf   Bacteria, UA MANY (A) NONE SEEN   Squamous Epithelial / LPF 0-5 0 - 5   Mucus PRESENT    Hyaline Casts, UA PRESENT     Comment: Performed at [facility], [place] [address],  [place], [place] [address]   Dg Chest 2 View  Result Date: 03/14/2018 CLINICAL DATA:  Weakness and dyspnea EXAM: CHEST - 2 VIEW COMPARISON:  07/16/2017 CT and CXR 01/12/2017 FINDINGS: Cardiomegaly with mild pulmonary vascular redistribution consistent with CHF. No pulmonary consolidation, effusion or pneumothorax. Tortuous atherosclerotic aorta. Surgical clips are seen in the right axilla. No acute osseous abnormality is noted. Degenerative changes are present along the dorsal spine. IMPRESSION: Cardiomegaly with mild CHF. Aortic atherosclerosis. Electronically Signed   By: Ashley Royalty M.D.   On: 03/14/2018 17:53    Pending Labs Unresulted Labs (From admission, onward)    Start     Ordered   03/14/18 2046  Urine culture  Add-on,   STAT     03/14/18 2045          Vitals/Pain Today's Vitals   03/14/18 1930 03/14/18 2000 03/14/18 2030 03/14/18 2239  BP: 119/79 119/60 (!) 116/56 (!) 101/56  Pulse: 83 67 67 85  Resp: (!) '22 15 13 18  ' Temp:      TempSrc:      SpO2: 96% 98% 98% 96%  Weight:      Height:      PainSc:  Isolation Precautions No active isolations  Medications Medications  cefTRIAXone (ROCEPHIN) 1 g in sodium chloride 0.9 % 100 mL IVPB (1 g Intravenous New Bag/Given 03/14/18 2238)  furosemide (LASIX) injection 80 mg (80 mg Intravenous Given 03/14/18 1921)    Mobility walks

## 2018-03-14 NOTE — ED Notes (Signed)
Bed: WA09 Expected date:  Expected time:  Means of arrival:  Comments: 

## 2018-03-14 NOTE — H&P (Signed)
History and Physical   Tamara Morales NMM:[id number] DOB: Feb 10, 1931 DOA: 03/14/2018  Referring MD/NP/PA: Dr. Alvino Chapel  PCP: Blanchie Serve, MD   Patient coming from: Home  Chief Complaint: Shortness of breath  HPI: Tamara Morales is a 84 y.o. female with medical history significant of diastolic dysfunction CHF, hyperlipidemia, hypertension, morbid obesity, history of breast cancer and diabetes who presents to the ER with worsening shortness of breath for about a week.  She has bilateral lymphedema which has also worsened.  She has noted orthopnea with PND as well as exertional dyspnea.  Patient is on oral Lasix.  She lives in an assisted living facility.  She is not able to take cardiac diet.  She is regularly at the cafeteria.  Patient denied any excess salt intake.  Denied any chest pain.  Patient appears to have fluid overload and an acute on chronic diastolic heart failure so she is being admitted for treatment.  ED Course: Temperature is 98 to blood pressure 101/43, pulse 85 respiratory 2 oxygen sat 82% on room air.  White count is 7.8 hemoglobin 13.1 and platelets of 151.  She has a sodium 141 potassium 3.0 chloride 95 CO2 34 BUN 25 creatinine 1.16 calcium 8.1 and glucose 138.  Urinalysis showed moderate leukocyte Estrace negative nitrite.  Many bacteria and WBC is 21-50.  Chest x-ray showed cardiomegaly with mild CHF.  Patient received IV Lasix in the ER and is being admitted for treatment.  Review of Systems: As per HPI otherwise 10 point review of systems negative.    Past Medical History:  Diagnosis Date  . ARTHRITIS, KNEES, BILATERAL   . Breast CA ([place]) 2000   s/p R lumpectomy and XRT  . DEPRESSION   . DJD (degenerative joint disease) of knee   . GERD   . HYPERLIPIDEMIA   . HYPERTENSION   . HYPOTHYROIDISM    postsurgical  . Hypoxia   . MIGRAINE HEADACHE   . Morbid obesity ([place])   . OSA (obstructive sleep apnea) 01/17/2011 dx  . Oxygen dependent   . Urge  incontinence   . UTI (lower urinary tract infection) 12/2014    Past Surgical History:  Procedure Laterality Date  . ABDOMINAL HYSTERECTOMY    . ADENOIDECTOMY    . BREAST BIOPSY  2000  . BREAST LUMPECTOMY  2000  . CATARACT EXTRACTION    . CHOLECYSTECTOMY  1995  . CYSTOSCOPY/URETEROSCOPY/HOLMIUM LASER Right 06/28/2015   Procedure: CYSTOSCOPY RIGHT RETROGRAD RIGHT URETEROSCOPY/HOLMIUM LASER WITH RIGHT STENT PLACEMENT;  Surgeon: Alexis Frock, MD;  Location: [facility];  Service: Urology;  Laterality: Right;  . PARATHYROIDECTOMY     2-3 removed  . REPLACEMENT TOTAL KNEE BILATERAL  1999, 2005  . Right leg femur repaired    . THYROIDECTOMY    . TONSILLECTOMY    . TUBAL LIGATION       reports that she quit smoking about 43 years ago. Her smoking use included cigarettes. She has a 0.50 pack-year smoking history. She has never used smokeless tobacco. She reports that she does not drink alcohol or use drugs.  Allergies  Allergen Reactions  . Azithromycin Itching  . Beta Adrenergic Blockers     Depression   . Ciprofloxacin Other (See Comments)    REACTION: edgy and very jumpy   . Codeine Nausea And Vomiting  . Epinephrine Other (See Comments)    REACTION: rapid pulse, sweats  . Klonopin [Clonazepam] Other (See Comments)    Makes her feel very suicidal   .  Oxycodone Other (See Comments)    Patient felt like it altered her mental status "Crazy" . Hallucinations later as well.  [name] Paxil [Paroxetine Hydrochloride] Other (See Comments)    Sever depression   . Prednisone     "makes me feel really bad"  . Propoxyphene Hcl Nausea And Vomiting  . Torsemide   . Tramadol Other (See Comments)    Feels "jittery"  . Vicodin [Hydrocodone-Acetaminophen] Other (See Comments)    Hallucinations  . Meloxicam Diarrhea  . Vancomycin Rash    Localized rash related to infusion rate.    Family History  Problem Relation Age of Onset  . Emphysema Sister   . Coronary artery disease Neg Hx      Prior  to Admission medications   Medication Sig Start Date End Date Taking? Authorizing Provider  acetaminophen (TYLENOL) 325 MG tablet Take 650 mg by mouth every 8 (eight) hours as needed for mild pain or moderate pain.    Yes [provider]  ALPRAZolam (XANAX) 0.5 MG tablet TAKE 1 TABLET TWICE DAILY AS NEEDED FOR ANXIETY. Patient taking differently: 0.25 mg at bedtime as needed for sleep.  02/15/18  Yes Blanchie Serve, MD  aspirin 81 MG tablet Take 81 mg by mouth daily.    Yes [provider]  furosemide (LASIX) 40 MG tablet Take 1.5 tablets (60 mg total) by mouth daily. Along with metolazone and potassium supplement 11/20/17  Yes Pandey, Mahima, MD  hydrochlorothiazide (HYDRODIURIL) 25 MG tablet Take 0.5 tablets (12.5 mg total) by mouth daily. 03/12/18  Yes Blanchie Serve, MD  levothyroxine (SYNTHROID, LEVOTHROID) 125 MCG tablet Take 1 tablet (125 mcg total) by mouth daily. 10/11/17  Yes Blanchie Serve, MD  loratadine (CLARITIN) 10 MG tablet Take 10 mg by mouth at bedtime.    Yes [provider]  losartan (COZAAR) 100 MG tablet TAKE 1 TABLET ONCE DAILY. HOLD FOR SYSTOLIC BLOOD PRESSURE LESS THAN 110. Patient taking differently: Take 100 mg by mouth daily. Hold for systolic blood pressure is less than 110 01/16/18  Yes Pandey, Mahima, MD  metolazone (ZAROXOLYN) 2.5 MG tablet Take 1 tablet (2.5 mg total) by mouth daily. 03/27/17  Yes Blanchie Serve, MD  omeprazole (PRILOSEC) 20 MG capsule TAKE (1) CAPSULE DAILY. Patient taking differently: Take 20 mg by mouth daily.  11/15/17  Yes Mast, Man X, NP  ondansetron (ZOFRAN) 4 MG tablet TAKE (1) TABLET BY MOUTH EVERY SIX HOURS AS NEEDED FOR NAUSEA. Patient taking differently: Take 4 mg by mouth every 6 (six) hours as needed for nausea or vomiting.  01/07/18  Yes Blanchie Serve, MD  potassium chloride SA (K-DUR,KLOR-CON) 20 MEQ tablet Take 60 mEq by mouth 2 (two) times daily.   Yes [provider]  sertraline (ZOLOFT) 50 MG tablet  Take 1 tablet (50 mg total) by mouth daily. With 25 mg daily for total of 75 mg daily Patient taking differently: Take 50 mg by mouth daily.  12/18/17  Yes Blanchie Serve, MD    Physical Exam: Vitals:   03/14/18 2000 03/14/18 2030 03/14/18 2239 03/14/18 2346  BP: 119/60 (!) 116/56 (!) 101/56 (!) 112/51  Pulse: 67 67 85 74  Resp: $Remo'15 13 18 16  'kgaUQ$ Temp:    98.2 F (36.8 C)  TempSrc:    Oral  SpO2: 98% 98% 96% 95%  Weight:      Height:          Constitutional: NAD, calm, comfortable Vitals:   03/14/18 2000 03/14/18 2030 03/14/18 2239 03/14/18  2346  BP: 119/60 (!) 116/56 (!) 101/56 (!) 112/51  Pulse: 67 67 85 74  Resp: $Remo'15 13 18 16  'asmTU$ Temp:    98.2 F (36.8 C)  TempSrc:    Oral  SpO2: 98% 98% 96% 95%  Weight:      Height:       Morbidly obese, no acute distress Eyes: PERRL, lids and conjunctivae normal ENMT: Mucous membranes are moist. Posterior pharynx clear of any exudate or lesions.Normal dentition.  Neck: normal, supple, no masses, no thyromegaly Respiratory: Bilateral basal crackles with good air entry bilaterally, no wheezing, Normal respiratory effort. No accessory muscle use.  Cardiovascular: Regular rate and rhythm, no murmurs / rubs / gallops. No extremity edema. 2+ pedal pulses. No carotid bruits.  Abdomen: no tenderness, no masses palpated. No hepatosplenomegaly. Bowel sounds positive.  Musculoskeletal: no clubbing / cyanosis. No joint deformity upper and lower extremities. Good ROM, no contractures. Normal muscle tone.  Skin: no rashes, lesions, ulcers. No induration Neurologic: CN 2-12 grossly intact. Sensation intact, DTR normal. Strength 5/5 in all 4.  Psychiatric: Normal judgment and insight. Alert and oriented x 3. Normal mood.     Labs on Admission: I have personally reviewed following labs and imaging studies  CBC: Recent Labs  Lab 03/14/18 1658  WBC 7.8  HGB 13.1  HCT 40.7  MCV 92.5  PLT 269   Basic Metabolic Panel: Recent Labs  Lab 03/14/18 1658    NA 141  K 3.0*  CL 95*  CO2 34*  GLUCOSE 138*  BUN 24*  CREATININE 1.16*  CALCIUM 8.1*   GFR: Estimated Creatinine Clearance: 47.6 mL/min (A) (by C-G formula based on SCr of 1.16 mg/dL (H)). Liver Function Tests: Recent Labs  Lab 03/14/18 1701  AST 21  ALT 15  ALKPHOS 90  BILITOT 0.6  PROT 6.8  ALBUMIN 3.4*   No results for input(s): LIPASE, AMYLASE in the last 168 hours. No results for input(s): AMMONIA in the last 168 hours. Coagulation Profile: No results for input(s): INR, PROTIME in the last 168 hours. Cardiac Enzymes: No results for input(s): CKTOTAL, CKMB, CKMBINDEX, TROPONINI in the last 168 hours. BNP (last 3 results) No results for input(s): PROBNP in the last 8760 hours. HbA1C: No results for input(s): HGBA1C in the last 72 hours. CBG: Recent Labs  Lab 03/14/18 1657  GLUCAP 126*   Lipid Profile: No results for input(s): CHOL, HDL, LDLCALC, TRIG, CHOLHDL, LDLDIRECT in the last 72 hours. Thyroid Function Tests: No results for input(s): TSH, T4TOTAL, FREET4, T3FREE, THYROIDAB in the last 72 hours. Anemia Panel: No results for input(s): VITAMINB12, FOLATE, FERRITIN, TIBC, IRON, RETICCTPCT in the last 72 hours. Urine analysis:    Component Value Date/Time   COLORURINE STRAW (A) 03/14/2018 1836   APPEARANCEUR CLEAR 03/14/2018 1836   LABSPEC 1.008 03/14/2018 1836   PHURINE 5.0 03/14/2018 1836   GLUCOSEU NEGATIVE 03/14/2018 1836   GLUCOSEU NEGATIVE 04/17/2012 1232   HGBUR NEGATIVE 03/14/2018 1836   BILIRUBINUR NEGATIVE 03/14/2018 1836   BILIRUBINUR neg 12/22/2016 1124   KETONESUR NEGATIVE 03/14/2018 1836   PROTEINUR NEGATIVE 03/14/2018 1836   UROBILINOGEN negative (A) 12/22/2016 1124   UROBILINOGEN 0.2 01/07/2015 1751   NITRITE NEGATIVE 03/14/2018 1836   LEUKOCYTESUR MODERATE (A) 03/14/2018 1836   Sepsis Labs: $RemoveBefo'[email or web address]'[id number]$ (procalcitonin:4,lacticidven:4) )No results found for this or any previous visit (from the past 240 hour(s)).   Radiological  Exams on Admission: Dg Chest 2 View  Result Date: 03/14/2018 CLINICAL DATA:  Weakness and dyspnea EXAM: CHEST -  2 VIEW COMPARISON:  07/16/2017 CT and CXR 01/12/2017 FINDINGS: Cardiomegaly with mild pulmonary vascular redistribution consistent with CHF. No pulmonary consolidation, effusion or pneumothorax. Tortuous atherosclerotic aorta. Surgical clips are seen in the right axilla. No acute osseous abnormality is noted. Degenerative changes are present along the dorsal spine. IMPRESSION: Cardiomegaly with mild CHF. Aortic atherosclerosis. Electronically Signed   By: Ashley Royalty M.D.   On: 03/14/2018 17:53    EKG: Independently reviewed.  Normal sinus rhythm with poor R wave progression.  Assessment/Plan Principal Problem:   Acute on chronic diastolic (congestive) heart failure (HCC) Active Problems:   Hypothyroidism (acquired)   Morbid obesity ([place])   Essential hypertension   Depression with anxiety   Hypokalemia UTI   #1 acute on chronic diastolic heart failure: Patient will be placed on IV Lasix.  Continue with other cardiac medications.  Repeat echocardiogram in the morning.  She had a previous echo in January of this year.  Suspected this to be diastolic dysfunction with regards to increased salt intake.  If abnormalities is detected we may get cardiology consultation tomorrow.  #2 possible UTI.  I will empirically start patient on IV Rocephin.  Monitor closely.  Cultures pending including blood and urine cultures.  #3 hypothyroidism: Continue with levothyroxine.  #4 essential hypertension: Continue with blood pressure medications.  #5 hypokalemia: Replete potassium and monitor closely.  #6 morbid obesity: Dietary counseling.  #7 depression with anxiety: Continue with home regimen.   DVT prophylaxis: Heparin Code Status: Full code Family Communication: Sister with patient Disposition Plan: Back to assisted living facility Consults called: None Admission status:  Observation  Severity of Illness: The appropriate patient status for this patient is OBSERVATION. Observation status is judged to be reasonable and necessary in order to provide the required intensity of service to ensure the patient's safety. The patient's presenting symptoms, physical exam findings, and initial radiographic and laboratory data in the context of their medical condition is felt to place them at decreased risk for further clinical deterioration. Furthermore, it is anticipated that the patient will be medically stable for discharge from the hospital within 2 midnights of admission. The following factors support the patient status of observation.   " The patient's presenting symptoms include shortness of breath and cough. " The physical exam findings include increased fluid overload. " The initial radiographic and laboratory data are chest x-ray showing CHF with urinalysis consistent with UTI.[name]     Barbette Merino MD [facility] Pager [phone]  If 7PM-7AM, please contact night-coverage [email or web address] Password [facility]  03/14/2018, 11:53 PM

## 2018-03-14 NOTE — ED Provider Notes (Signed)
[facility] DEPT Provider Note  CSN: [id number] Arrival date & time: 03/14/18  1534  History   Chief Complaint Chief Complaint  Patient presents with  . Weakness    HPI Tamara Morales is a 83 y.o. female with a medical history of OSA, HTN, hypothyroidism, breast cancer, Type 2 DM and chronic lymphedema. who presented to the ED for weakness. Patient reports "being awaken from her nap with weakness." Associated symptom: SOB and nausea.  Denies chest pain, dizziness/lightheadedness, vision changes, slurred speech, paresthesias, abdominal pain or headache. Denies fever, skin rashes/lesions, arthralgias or urinary complaints. She reports upper respiratory symptoms of postnasal drip, sore throat and cough that began yesterday. The nurses at her facility put her on Bi-Pap prior to EMS transport due to SOB complaints, but no pharm therapies.  Additional history obtained by medical chart. Patient last seen by PCP on 03/12/18 and there were no significant changes in medications or her management at that time. No complaints of SOB or weakness.  Past Medical History:  Diagnosis Date  . ARTHRITIS, KNEES, BILATERAL   . Breast CA ([place]) 2000   s/p R lumpectomy and XRT  . DEPRESSION   . DJD (degenerative joint disease) of knee   . GERD   . HYPERLIPIDEMIA   . HYPERTENSION   . HYPOTHYROIDISM    postsurgical  . Hypoxia   . MIGRAINE HEADACHE   . Morbid obesity ([place])   . OSA (obstructive sleep apnea) 01/17/2011 dx  . Oxygen dependent   . Urge incontinence   . UTI (lower urinary tract infection) 12/2014    Patient Active Problem List   Diagnosis Date Noted  . Acute on chronic diastolic (congestive) heart failure ([place]) 03/14/2018  . Diabetes mellitus type 2 in obese ([place]) 03/12/2018  . Lymphedema 11/20/2017  . Counseling regarding advanced care planning and goals of care 09/20/2017  . URI (upper respiratory infection) 08/30/2017  . Hypomagnesemia 07/17/2017  .  Chest pain 07/16/2017  . Prediabetes 07/03/2017  . Hyperlipidemia LDL goal <70 07/03/2017  . Upper airway cough syndrome 06/27/2017  . CKD (chronic kidney disease) stage 3, GFR 30-59 ml/min (HCC) 05/22/2017  . Bilateral dry eyes 04/20/2017  . Hemorrhoids 04/09/2017  . Chronic acquired lymphedema 03/30/2017  . Chronic constipation 03/30/2017  . Weight gain 12/07/2016  . Neck pain on left side 10/05/2016  . Chronic left shoulder pain 08/10/2016  . Hematuria 08/10/2016  . Numbness and tingling of right leg 04/06/2016  . Contusion of right breast 04/06/2016  . IBS (irritable bowel syndrome) 11/18/2015  . Hypokalemia 07/07/2015  . Acid reflux 07/05/2015  . Lymphedema of both lower extremities 07/02/2015  . Ureterolithiasis   . Ureteral stone with hydronephrosis 06/28/2015  . Right ureteral calculus 06/27/2015  . Depression with anxiety 06/27/2015  . History of breast cancer 06/27/2015  . Abdominal pain, RLQ (right lower quadrant) 06/27/2015  . Obesity hypoventilation syndrome ([place]) 11/27/2014  . Orthopnea 04/09/2013  . SOB (shortness of breath) 03/13/2013  . Other chest pain 11/30/2012  . Cough 03/05/2010  . Morbid obesity ([place]) 10/04/2009  . Hypothyroidism (acquired) 08/05/2009  . Essential hypertension 08/05/2009    Past Surgical History:  Procedure Laterality Date  . ABDOMINAL HYSTERECTOMY    . ADENOIDECTOMY    . BREAST BIOPSY  2000  . BREAST LUMPECTOMY  2000  . CATARACT EXTRACTION    . CHOLECYSTECTOMY  1995  . CYSTOSCOPY/URETEROSCOPY/HOLMIUM LASER Right 06/28/2015   Procedure: CYSTOSCOPY RIGHT RETROGRAD RIGHT URETEROSCOPY/HOLMIUM LASER WITH RIGHT STENT PLACEMENT;  Surgeon: Alexis Frock, MD;  Location: [facility];  Service: Urology;  Laterality: Right;  . PARATHYROIDECTOMY     2-3 removed  . REPLACEMENT TOTAL KNEE BILATERAL  1999, 2005  . Right leg femur repaired    . THYROIDECTOMY    . TONSILLECTOMY    . TUBAL LIGATION       OB History    Gravida  5   Para  5    Term      Preterm      AB      Living        SAB      TAB      Ectopic      Multiple      Live Births               Home Medications    Prior to Admission medications   Medication Sig Start Date End Date Taking? Authorizing Provider  acetaminophen (TYLENOL) 325 MG tablet Take 650 mg by mouth every 8 (eight) hours as needed for mild pain or moderate pain.    Yes [provider]  ALPRAZolam (XANAX) 0.5 MG tablet TAKE 1 TABLET TWICE DAILY AS NEEDED FOR ANXIETY. Patient taking differently: 0.25 mg at bedtime as needed for sleep.  02/15/18  Yes Blanchie Serve, MD  aspirin 81 MG tablet Take 81 mg by mouth daily.    Yes [provider]  furosemide (LASIX) 40 MG tablet Take 1.5 tablets (60 mg total) by mouth daily. Along with metolazone and potassium supplement 11/20/17  Yes Pandey, Mahima, MD  hydrochlorothiazide (HYDRODIURIL) 25 MG tablet Take 0.5 tablets (12.5 mg total) by mouth daily. 03/12/18  Yes Blanchie Serve, MD  levothyroxine (SYNTHROID, LEVOTHROID) 125 MCG tablet Take 1 tablet (125 mcg total) by mouth daily. 10/11/17  Yes Blanchie Serve, MD  loratadine (CLARITIN) 10 MG tablet Take 10 mg by mouth at bedtime.    Yes [provider]  losartan (COZAAR) 100 MG tablet TAKE 1 TABLET ONCE DAILY. HOLD FOR SYSTOLIC BLOOD PRESSURE LESS THAN 110. Patient taking differently: Take 100 mg by mouth daily. Hold for systolic blood pressure is less than 110 01/16/18  Yes Pandey, Mahima, MD  metolazone (ZAROXOLYN) 2.5 MG tablet Take 1 tablet (2.5 mg total) by mouth daily. 03/27/17  Yes Blanchie Serve, MD  omeprazole (PRILOSEC) 20 MG capsule TAKE (1) CAPSULE DAILY. Patient taking differently: Take 20 mg by mouth daily.  11/15/17  Yes Mast, Man X, NP  ondansetron (ZOFRAN) 4 MG tablet TAKE (1) TABLET BY MOUTH EVERY SIX HOURS AS NEEDED FOR NAUSEA. Patient taking differently: Take 4 mg by mouth every 6 (six) hours as needed for nausea or vomiting.  01/07/18  Yes Blanchie Serve,  MD  potassium chloride SA (K-DUR,KLOR-CON) 20 MEQ tablet Take 60 mEq by mouth 2 (two) times daily.   Yes [provider]  sertraline (ZOLOFT) 50 MG tablet Take 1 tablet (50 mg total) by mouth daily. With 25 mg daily for total of 75 mg daily Patient taking differently: Take 50 mg by mouth daily.  12/18/17  Yes Blanchie Serve, MD    Family History Family History  Problem Relation Age of Onset  . Emphysema Sister   . Coronary artery disease Neg Hx     Social History Social History   Tobacco Use  . Smoking status: Former Smoker    Packs/day: 0.25    Years: 2.00    Pack years: 0.50    Types: Cigarettes    Last  attempt to quit: 06/19/1974    Years since quitting: 43.7  . Smokeless tobacco: Never Used  . Tobacco comment: Quit in late 20's  Substance Use Topics  . Alcohol use: No    Alcohol/week: 0.0 standard drinks    Comment: rare  . Drug use: No     Allergies   Azithromycin; Beta adrenergic blockers; Ciprofloxacin; Codeine; Epinephrine; Klonopin [clonazepam]; Oxycodone; Paxil [paroxetine hydrochloride]; Prednisone; Propoxyphene hcl; Torsemide; Tramadol; Vicodin [hydrocodone-acetaminophen]; Meloxicam; and Vancomycin   Review of Systems Review of Systems  Constitutional: Negative for chills, fatigue and fever.  HENT: Positive for postnasal drip and sore throat.   Eyes: Negative for pain and visual disturbance.  Respiratory: Positive for cough and shortness of breath.   Cardiovascular: Negative for chest pain and palpitations.       Chronic leg swelling. Unchanged from baseline.  Gastrointestinal: Positive for nausea. Negative for abdominal pain, constipation, diarrhea and vomiting.  Genitourinary: Negative for dysuria, frequency and urgency.  Musculoskeletal: Negative.   Skin: Positive for color change.       Chronic lymphedema color changes.  Neurological: Positive for weakness. Negative for dizziness, syncope, facial asymmetry, speech difficulty, light-headedness,  numbness and headaches.  Psychiatric/Behavioral: Negative for confusion and decreased concentration.   Physical Exam Updated Vital Signs BP (!) 116/56   Pulse 67   Temp 97.9 F (36.6 C) (Oral)   Resp 13   Ht $R'5\' 3"'ws$  (1.6 m)   Wt 133.8 kg   SpO2 98%   BMI 52.26 kg/m   Physical Exam  Constitutional: She is oriented to person, place, and time. Vital signs are normal. She is cooperative. Nasal cannula in place.  Obese. Chronic ill appearance.  HENT:  Right Ear: External ear normal.  Left Ear: Tympanic membrane, external ear and ear canal normal.  Mouth/Throat: Uvula is midline and mucous membranes are normal. Posterior oropharyngeal erythema present. No oropharyngeal exudate or posterior oropharyngeal edema. No tonsillar exudate.  Unable to visualize right ear canal and TM due to cerumen impaction.  Eyes: Pupils are equal, round, and reactive to light. Conjunctivae, EOM and lids are normal.  Neck: Trachea normal, normal range of motion, full passive range of motion without pain and phonation normal. Neck supple. Muscular tenderness present. No spinous process tenderness present. No neck rigidity. Normal range of motion present.  Cardiovascular: Normal rate and regular rhythm.  No murmur heard. Pulses:      Radial pulses are 2+ on the right side, and 2+ on the left side.  Unable to palpate lower extremity pulses to edema.  Pulmonary/Chest: Effort normal. No apnea. No respiratory distress. She has rales.  Bilateral rales heard in bases.  Abdominal: Soft. Normal appearance and bowel sounds are normal. She exhibits no distension. There is no tenderness.  Musculoskeletal: Normal range of motion.  Neurological: She is alert and oriented to person, place, and time. She has normal strength. No cranial nerve deficit or sensory deficit. She exhibits normal muscle tone.  Baseline ambulates with wheelchair  Skin: Skin is warm. Capillary refill takes 2 to 3 seconds.  Chronic lymphedemic color  changes in lower extremities bilaterally.  Nursing note and vitals reviewed.  ED Treatments / Results  Labs (all labs ordered are listed, but only abnormal results are displayed) Labs Reviewed  BASIC METABOLIC PANEL - Abnormal; Notable for the following components:      Result Value   Potassium 3.0 (*)    Chloride 95 (*)    CO2 34 (*)    Glucose, Bld  138 (*)    BUN 24 (*)    Creatinine, Ser 1.16 (*)    Calcium 8.1 (*)    GFR calc non Af Amer 42 (*)    GFR calc Af Amer 48 (*)    All other components within normal limits  URINALYSIS, ROUTINE W REFLEX MICROSCOPIC - Abnormal; Notable for the following components:   Color, Urine STRAW (*)    Leukocytes, UA MODERATE (*)    Bacteria, UA MANY (*)    All other components within normal limits  BRAIN NATRIURETIC PEPTIDE - Abnormal; Notable for the following components:   B Natriuretic Peptide 137.6 (*)    All other components within normal limits  HEPATIC FUNCTION PANEL - Abnormal; Notable for the following components:   Albumin 3.4 (*)    All other components within normal limits  CBG MONITORING, ED - Abnormal; Notable for the following components:   Glucose-Capillary 126 (*)    All other components within normal limits  URINE CULTURE  CBC  I-STAT TROPONIN, ED  I-STAT CG4 LACTIC ACID, ED    EKG EKG Interpretation  Date/Time:  Thursday March 14 2018 16:29:07 EDT Ventricular Rate:  69 PR Interval:    QRS Duration: 101 QT Interval:  408 QTC Calculation: 438 R Axis:   7 Text Interpretation:  Sinus rhythm Abnormal R-wave progression, early transition No significant change since last tracing Confirmed by Duffy Bruce [phone]) on 03/14/2018 4:33:00 PM   Radiology Dg Chest 2 View  Result Date: 03/14/2018 CLINICAL DATA:  Weakness and dyspnea EXAM: CHEST - 2 VIEW COMPARISON:  07/16/2017 CT and CXR 01/12/2017 FINDINGS: Cardiomegaly with mild pulmonary vascular redistribution consistent with CHF. No pulmonary consolidation,  effusion or pneumothorax. Tortuous atherosclerotic aorta. Surgical clips are seen in the right axilla. No acute osseous abnormality is noted. Degenerative changes are present along the dorsal spine. IMPRESSION: Cardiomegaly with mild CHF. Aortic atherosclerosis. Electronically Signed   By: Ashley Royalty M.D.   On: 03/14/2018 17:53   Procedures Procedures (including critical care time)  Medications Ordered in ED Medications  cefTRIAXone (ROCEPHIN) 1 g in sodium chloride 0.9 % 100 mL IVPB (has no administration in time range)  furosemide (LASIX) injection 80 mg (80 mg Intravenous Given 03/14/18 1921)   Initial Impression / Assessment and Plan / ED Course  Triage vital signs and the nursing notes have been reviewed.  Pertinent labs & imaging results that were available during care of the patient were reviewed and considered in medical decision making (see chart for details).  Patient presents with complaints of acute onset weakness. Preceding upper respiratory complaints of cough, sore throat and postnasal drip that started yesterday. Other complaints today include nausea and overall not feeling well. Physical exam significant for bilateral rales heard in lung fields bilaterally. She is afebrile. Placed on 3L [place] of oxygen in triage due to low oxygen sats. Will proceed with general weakness work-up to include bloodwork, EKG and CXR.  Clinical Course as of Mar 14 2202  Thu Mar 14, 2018  1630 EKG resulted shows NSR. No ST changes or signs of acute ischemia or infarct. Patient placed on 3L [place] upon arrival as oxygen sats were 82%. Patient states she is on [place] at home, but at night only.   [GM]  1636 Case discussed with Dr. Duffy Bruce. Agreed with previously ordered lab work, EKG and CXR for general weakness.   [GM]  1712 Normal lactic acid.   [GM]  1755 Hypokalemic at 3.0. No associated EKG changes  or s/s of hypokalemia. On PO supplements as outpatient. Elevated creatinine from baseline. Will hold  on fluid replenishment as patient's pulmonary suggest fluid overload. Negative troponin useful in ruling out myocardial infarct as cause for weakness.   [GM]  4461 Mildy elevated BNP. Last recorded BNP was from 2017. CXR consistent with CHF. Will administer IV Lasix $Remove'80mg'epzaFgg$  for diuresis. Will re-evaluate patient to see if she is stable for discharge after diuresis.  B Natriuretic Peptide(!): 137.6 [GM]  2045 UA suggestive of UTI, but patient has no symptoms. Not indicated to treat asymptomatic bacteruia. Will send urine culture. Will have pharmacy contact patient if culture grows anything and antibiotics are needed.   [GM]  2046 Patient re-evaluated after Lasix administered. Patient has significant urine output. Reports decreased SOB, but remains tachypneic with ambulation and still has rales present on exam. Admission likely will await re-evaluation by Dr. Ellender Hose.   [GM]  2151 Dr. Ellender Hose re-evaluated patient and agreed with plan to admit for HF. Last echo done in 06/2017. EF of 60-65%.   [GM]  2201 Case discussed with Dr. Jonelle Sidle from [facility] who will admit patient.   [GM]    Clinical Course User Index [GM] Isabell Bonafede, Jonelle Sports, PA-C   Final Clinical Impressions(s) / ED Diagnoses  1. Acute HF. IV Lasix $Remove'80mg'xhFikqV$  x1 given in the ED with some relief, but patient remains tachypneic and has de-saturations with movement. Case discussed with [facility] for admission.  Dispo: Admit.  Final diagnoses:  Heart failure, unspecified HF chronicity, unspecified heart failure type [facility])    ED Discharge Orders    None        Junita Push 03/14/18 2203    Duffy Bruce, MD 03/15/18 0006

## 2018-03-14 NOTE — ED Triage Notes (Signed)
Patient BIB [facility] from [place] [facility] at [facility] for weakness and sob. Pt woke up from nap feeling weak and sob. Called nurse at center, who put pt on bi-pap (pt sleeps on bi-pap). EMS reports clear lung sounds. Pt a/o x4, denies pain, able to stand and pivot.

## 2018-03-15 ENCOUNTER — Other Ambulatory Visit: Payer: Self-pay

## 2018-03-15 ENCOUNTER — Inpatient Hospital Stay (HOSPITAL_COMMUNITY): Payer: Medicare Other

## 2018-03-15 DIAGNOSIS — I5033 Acute on chronic diastolic (congestive) heart failure: Secondary | ICD-10-CM

## 2018-03-15 DIAGNOSIS — E876 Hypokalemia: Secondary | ICD-10-CM

## 2018-03-15 DIAGNOSIS — I340 Nonrheumatic mitral (valve) insufficiency: Secondary | ICD-10-CM

## 2018-03-15 DIAGNOSIS — I10 Essential (primary) hypertension: Secondary | ICD-10-CM

## 2018-03-15 DIAGNOSIS — I509 Heart failure, unspecified: Secondary | ICD-10-CM

## 2018-03-15 LAB — ECHOCARDIOGRAM COMPLETE
Height: 65 in
Weight: 4656.12 [oz_av]

## 2018-03-15 LAB — CBC WITH DIFFERENTIAL/PLATELET
Basophils Absolute: 0 10*3/uL (ref 0.0–0.1)
Basophils Relative: 0 %
EOS ABS: 0.2 10*3/uL (ref 0.0–0.7)
Eosinophils Relative: 3 %
HEMATOCRIT: 39 % (ref 36.0–46.0)
HEMOGLOBIN: 12.3 g/dL (ref 12.0–15.0)
LYMPHS ABS: 1.4 10*3/uL (ref 0.7–4.0)
Lymphocytes Relative: 21 %
MCH: 29.2 pg (ref 26.0–34.0)
MCHC: 31.5 g/dL (ref 30.0–36.0)
MCV: 92.6 fL (ref 78.0–100.0)
MONOS PCT: 7 %
Monocytes Absolute: 0.5 10*3/uL (ref 0.1–1.0)
NEUTROS PCT: 69 %
Neutro Abs: 4.7 10*3/uL (ref 1.7–7.7)
Platelets: 142 10*3/uL — ABNORMAL LOW (ref 150–400)
RBC: 4.21 MIL/uL (ref 3.87–5.11)
RDW: 15.1 % (ref 11.5–15.5)
WBC: 6.8 10*3/uL (ref 4.0–10.5)

## 2018-03-15 LAB — COMPREHENSIVE METABOLIC PANEL WITH GFR
ALT: 13 U/L (ref 0–44)
AST: 14 U/L — ABNORMAL LOW (ref 15–41)
Albumin: 3 g/dL — ABNORMAL LOW (ref 3.5–5.0)
Alkaline Phosphatase: 74 U/L (ref 38–126)
Anion gap: 12 (ref 5–15)
BUN: 22 mg/dL (ref 8–23)
CO2: 34 mmol/L — ABNORMAL HIGH (ref 22–32)
Calcium: 7.8 mg/dL — ABNORMAL LOW (ref 8.9–10.3)
Chloride: 96 mmol/L — ABNORMAL LOW (ref 98–111)
Creatinine, Ser: 0.98 mg/dL (ref 0.44–1.00)
GFR calc Af Amer: 59 mL/min — ABNORMAL LOW
GFR calc non Af Amer: 51 mL/min — ABNORMAL LOW
Glucose, Bld: 116 mg/dL — ABNORMAL HIGH (ref 70–99)
Potassium: 2.9 mmol/L — ABNORMAL LOW (ref 3.5–5.1)
Sodium: 142 mmol/L (ref 135–145)
Total Bilirubin: 0.4 mg/dL (ref 0.3–1.2)
Total Protein: 6.1 g/dL — ABNORMAL LOW (ref 6.5–8.1)

## 2018-03-15 LAB — MRSA PCR SCREENING: MRSA by PCR: NEGATIVE

## 2018-03-15 MED ORDER — SODIUM CHLORIDE 0.9 % IV SOLN: 1.0000 g | INTRAVENOUS | Status: DC

## 2018-03-15 MED ORDER — POLYETHYLENE GLYCOL 3350 17 G PO PACK
17.0000 g | PACK | Freq: Every day | ORAL | Status: DC
  Administered 2018-03-15 – 2018-03-18 (×5): 17 g via ORAL
  Filled 2018-03-15 (×4): qty 1

## 2018-03-15 MED ORDER — INFLUENZA VAC SPLIT HIGH-DOSE 0.5 ML IM SUSY
0.5000 mL | PREFILLED_SYRINGE | INTRAMUSCULAR | Status: AC
  Administered 2018-03-16: 0.5 mL via INTRAMUSCULAR
  Filled 2018-03-15: qty 0.5

## 2018-03-15 NOTE — Progress Notes (Signed)
PROGRESS NOTE    Tamara Morales  PPJ:[id number] DOB: Nov 18, 1930 DOA: 03/14/2018 PCP: Blanchie Serve, MD    Brief Narrative:  84 y.o. female with medical history significant of diastolic dysfunction CHF, hyperlipidemia, hypertension, morbid obesity, history of breast cancer and diabetes who presents to the ER with worsening shortness of breath for about a week.  She has bilateral lymphedema which has also worsened.  She has noted orthopnea with PND as well as exertional dyspnea.  Patient is on oral Lasix.  She lives in an assisted living facility.  She is not able to take cardiac diet.  She is regularly at the cafeteria.  Patient denied any excess salt intake.  Denied any chest pain.  Patient appears to have fluid overload and an acute on chronic diastolic heart failure so she is being admitted for treatment.  ED Course: Temperature is 98 to blood pressure 101/43, pulse 85 respiratory 2 oxygen sat 82% on room air.  White count is 7.8 hemoglobin 13.1 and platelets of 151.  She has a sodium 141 potassium 3.0 chloride 95 CO2 34 BUN 25 creatinine 1.16 calcium 8.1 and glucose 138.  Urinalysis showed moderate leukocyte Estrace negative nitrite.  Many bacteria and WBC is 21-50.  Chest x-ray showed cardiomegaly with mild CHF.  Patient received IV Lasix in the ER and is being admitted for treatment.  Assessment & Plan:   Principal Problem:   Acute on chronic diastolic (congestive) heart failure (HCC) Active Problems:   Hypothyroidism (acquired)   Morbid obesity (HCC)   Essential hypertension   Depression with anxiety   UTI (urinary tract infection)   Hypokalemia  #1 acute on chronic diastolic heart failure:  -Patient reports feeling somewhat improved, tolerating lasix -Repeat 2d echo performed, pending results -LE still edematous, albeit pt has hx of lymphedema  -Repeat bmet in AM  #2 UTI ruled out -Asymptomatic bacturia -UA and culture suggesting UTI, however patient is  asymptomatic -Hold further abx for now. Monitor off abx. Afebrile  #3 hypothyroidism:  -Stable at present -Continue with levothyroxine as tolerated  #4 essential hypertension:  -Continue with blood pressure medications as tolerated -Stable at present  #5 hypokalemia:  -Replacement ordered -Will repeat bmet in AM  #6 morbid obesity:  -Recommend diet/lifestyle modification  #7 depression with anxiety:  -Continue with home regimen. -Stable at present  DVT prophylaxis: Heparin subQ Code Status: Full Family Communication: Pt in room, family not at bedside Disposition Plan: Uncertain at this time  Consultants:     Procedures:     Antimicrobials: Anti-infectives (From admission, onward)   Start     Dose/Rate Route Frequency Ordered Stop   03/15/18 2200  cefTRIAXone (ROCEPHIN) 1 g in sodium chloride 0.9 % 100 mL IVPB  Status:  Discontinued     1 g 200 mL/hr over 30 Minutes Intravenous Every 24 hours 03/15/18 0131 03/15/18 1124   03/14/18 2215  cefTRIAXone (ROCEPHIN) 1 g in sodium chloride 0.9 % 100 mL IVPB     1 g 200 mL/hr over 30 Minutes Intravenous  Once 03/14/18 2202 03/14/18 2317       Subjective: Reports feeling somewhat better  Objective: Vitals:   03/14/18 2346 03/14/18 2349 03/15/18 0536 03/15/18 1252  BP: (!) 112/51  138/67 110/64  Pulse: 74  87 69  Resp: $Remo'16  20 20  'ZlnFw$ Temp: 98.2 F (36.8 C)  97.9 F (36.6 C) 97.7 F (36.5 C)  TempSrc: Oral  Oral Oral  SpO2: 95%  97% 96%  Weight:  133 kg 132 kg   Height:  5\' 5"  (1.651 m)      Intake/Output Summary (Last 24 hours) at 03/15/2018 1516 Last data filed at 03/15/2018 1300 Gross per 24 hour  Intake 100 ml  Output 2100 ml  Net -2000 ml   Filed Weights   03/14/18 1605 03/14/18 2349 03/15/18 0536  Weight: 133.8 kg 133 kg 132 kg    Examination:  General exam: Appears calm and comfortable  Respiratory system: Clear to auscultation. Respiratory effort normal. Cardiovascular system: S1 & S2  heard, RRR Gastrointestinal system: Abdomen is nondistended, soft and nontender. No organomegaly or masses felt. Normal bowel sounds heard. Central nervous system: Alert and oriented. No focal neurological deficits. Extremities: Symmetric 5 x 5 power, BLE edematous. Skin: No rashes, lesions Psychiatry: Judgement and insight appear normal. Mood & affect appropriate.   Data Reviewed: I have personally reviewed following labs and imaging studies  CBC: Recent Labs  Lab 03/14/18 1658 03/15/18 0540  WBC 7.8 6.8  NEUTROABS  --  4.7  HGB 13.1 12.3  HCT 40.7 39.0  MCV 92.5 92.6  PLT 151 142*   Basic Metabolic Panel: Recent Labs  Lab 03/14/18 1658 03/15/18 0540  NA 141 142  K 3.0* 2.9*  CL 95* 96*  CO2 34* 34*  GLUCOSE 138* 116*  BUN 24* 22  CREATININE 1.16* 0.98  CALCIUM 8.1* 7.8*   GFR: Estimated Creatinine Clearance: 57.6 mL/min (by C-G formula based on SCr of 0.98 mg/dL). Liver Function Tests: Recent Labs  Lab 03/14/18 1701 03/15/18 0540  AST 21 14*  ALT 15 13  ALKPHOS 90 74  BILITOT 0.6 0.4  PROT 6.8 6.1*  ALBUMIN 3.4* 3.0*   No results for input(s): LIPASE, AMYLASE in the last 168 hours. No results for input(s): AMMONIA in the last 168 hours. Coagulation Profile: No results for input(s): INR, PROTIME in the last 168 hours. Cardiac Enzymes: No results for input(s): CKTOTAL, CKMB, CKMBINDEX, TROPONINI in the last 168 hours. BNP (last 3 results) No results for input(s): PROBNP in the last 8760 hours. HbA1C: No results for input(s): HGBA1C in the last 72 hours. CBG: Recent Labs  Lab 03/14/18 1657  GLUCAP 126*   Lipid Profile: No results for input(s): CHOL, HDL, LDLCALC, TRIG, CHOLHDL, LDLDIRECT in the last 72 hours. Thyroid Function Tests: No results for input(s): TSH, T4TOTAL, FREET4, T3FREE, THYROIDAB in the last 72 hours. Anemia Panel: No results for input(s): VITAMINB12, FOLATE, FERRITIN, TIBC, IRON, RETICCTPCT in the last 72 hours. Sepsis  Labs: Recent Labs  Lab 03/14/18 1710  LATICACIDVEN 1.66    Recent Results (from the past 240 hour(s))  MRSA PCR Screening     Status: None   Collection Time: 03/15/18  3:16 AM  Result Value Ref Range Status   MRSA by PCR NEGATIVE NEGATIVE Final    Comment:        The GeneXpert MRSA Assay (FDA approved for NASAL specimens only), is one component of a comprehensive MRSA colonization surveillance program. It is not intended to diagnose MRSA infection nor to guide or monitor treatment for MRSA infections. Performed at [facility], [address] [place], [place]      Radiology Studies: Dg Chest 2 View  Result Date: 03/14/2018 CLINICAL DATA:  Weakness and dyspnea EXAM: CHEST - 2 VIEW COMPARISON:  07/16/2017 CT and CXR 01/12/2017 FINDINGS: Cardiomegaly with mild pulmonary vascular redistribution consistent with CHF. No pulmonary consolidation, effusion or pneumothorax. Tortuous atherosclerotic aorta. Surgical clips are seen  in the right axilla. No acute osseous abnormality is noted. Degenerative changes are present along the dorsal spine. IMPRESSION: Cardiomegaly with mild CHF. Aortic atherosclerosis. Electronically Signed   By: Ashley Royalty M.D.   On: 03/14/2018 17:53    Scheduled Meds: . aspirin EC  81 mg Oral Daily  . furosemide  40 mg Intravenous BID  . heparin  5,000 Units Subcutaneous Q8H  . [START ON 03/16/2018] Influenza vac split quadrivalent PF  0.5 mL Intramuscular Tomorrow-1000  . levothyroxine  125 mcg Oral QAC breakfast  . loratadine  10 mg Oral QHS  . losartan  100 mg Oral Daily  . metolazone  2.5 mg Oral Daily  . pantoprazole  40 mg Oral Daily  . polyethylene glycol  17 g Oral Daily  . potassium chloride SA  60 mEq Oral BID  . sertraline  50 mg Oral Daily  . sodium chloride flush  3 mL Intravenous Q12H   Continuous Infusions: . sodium chloride       LOS: 1 day   Marylu Lund, MD [facility] Pager On Amion  If  7PM-7AM, please contact night-coverage 03/15/2018, 3:16 PM

## 2018-03-15 NOTE — Evaluation (Signed)
Physical Therapy Evaluation Patient Details Name: Tamara Morales MRN: [id number] DOB: May 01, 1931 Today's Date: 03/15/2018   History of Present Illness  84 y.o. female with medical history significant of diastolic dysfunction CHF, hyperlipidemia, hypertension, morbid obesity, history of breast cancer and diabetes who presents to the ER with worsening shortness of breath for about a week  Clinical Impression  Patient evaluated by Physical Therapy with no further acute PT needs identified. All education has been completed and the patient has no further questions.  Pt is at her baseline per my eval and per her report; she is  mod I with bed mobility today using bed rail ( does not actually sleep in the bed at home,  she sleeps in a recliner);   She uses her power chair to go the dining room for meals; at most she takes "a couple steps" and she was able to do this today when up to recliner with nursing staff;  See below for any follow-up Physical Therapy or equipment needs. PT is signing off. Thank you for this referral.   Follow Up Recommendations No PT follow up    Equipment Recommendations  None recommended by PT    Recommendations for Other Services       Precautions / Restrictions Precautions Precautions: Fall Restrictions Weight Bearing Restrictions: No      Mobility  Bed Mobility Overal bed mobility: Modified Independent             General bed mobility comments: HOB elevated, pt sleeps in recliner at home  Transfers                 General transfer comment: pt reports being up to chair earlier today with nursing  Ambulation/Gait             General Gait Details: non-amb at baseline  Stairs            Wheelchair Mobility    Modified Rankin (Stroke Patients Only)       Balance Overall balance assessment: (NT, denied falls)                                           Pertinent Vitals/Pain Pain Assessment: No/denies  pain    Home Living Family/patient expects to be discharged to:: Private residence(independent living) Living Arrangements: Alone Available Help at Discharge: Family Type of Home: Independent living facility       Home Layout: One level Home Equipment: Environmental consultant - 2 wheels;Walker - 4 wheels;Cane - single point;Electric scooter      Prior Function Level of Independence: Independent with assistive device(s)         Comments: takes a few steps in her apt at times; primarily transfers to power chair     Hand Dominance        Extremity/Trunk Assessment   Upper Extremity Assessment Upper Extremity Assessment: Overall WFL for tasks assessed    Lower Extremity Assessment RLE Deficits / Details: limited ROm d/t body habitus; at least 3/5 (grossly at baseline per pt)       Communication   Communication: No difficulties  Cognition Arousal/Alertness: Awake/alert Behavior During Therapy: WFL for tasks assessed/performed Overall Cognitive Status: Within Functional Limits for tasks assessed  General Comments      Exercises     Assessment/Plan    PT Assessment Patent does not need any further PT services  PT Problem List         PT Treatment Interventions      PT Goals (Current goals can be found in the Care Plan section)  Acute Rehab PT Goals PT Goal Formulation: All assessment and education complete, DC therapy    Frequency     Barriers to discharge        Co-evaluation               AM-PAC PT "6 Clicks" Daily Activity  Outcome Measure Difficulty turning over in bed (including adjusting bedclothes, sheets and blankets)?: A Little Difficulty moving from lying on back to sitting on the side of the bed? : A Little Difficulty sitting down on and standing up from a chair with arms (e.g., wheelchair, bedside commode, etc,.)?: A Little Help needed moving to and from a bed to chair (including a  wheelchair)?: A Little Help needed walking in hospital room?: A Lot Help needed climbing 3-5 steps with a railing? : Total 6 Click Score: 15    End of Session   Activity Tolerance: Patient tolerated treatment well Patient left: Other (comment);with call bell/phone within reach(EOB eating dinner)   PT Visit Diagnosis: Muscle weakness (generalized) (M62.81)    Time: 5366-4403 PT Time Calculation (min) (ACUTE ONLY): 22 min   Charges:   PT Evaluation $PT Eval Low Complexity: 1 Low          Kenyon Ana, PT Pager: [phone] 03/15/2018   [facility] [phone]   [facility] 03/15/2018, 5:55 PM

## 2018-03-15 NOTE — Progress Notes (Signed)
Pt. Wanted to try on for mask sixe prior to h/s placement, made aware to notify when ready.Marland KitchenMarland Kitchen

## 2018-03-15 NOTE — Care Management Note (Signed)
Case Management Note  Patient Details  Name: Tamara Morales MRN: [id number] Date of Birth: 10-Jul-1930  Subjective/Objective:  Admitted w/CHF. From [facility]-indep liv. PT cons-await recc.                  Action/Plan:djc plan back to indep liv   Expected Discharge Date:                  Expected Discharge Plan:  [place]  In-House Referral:  Clinical Social Work  Discharge planning Services  CM Consult  Post Acute Care Choice:  Durable Medical Equipment(w/c) Choice offered to:     DME Arranged:    DME Agency:     HH Arranged:    [facility]:     Status of Service:  In process, will continue to follow  If discussed at Long Length of Stay Meetings, dates discussed:    Additional Comments:  Dessa Phi, RN 03/15/2018, 12:47 PM

## 2018-03-16 LAB — BASIC METABOLIC PANEL
Anion gap: 11 (ref 5–15)
BUN: 26 mg/dL — AB (ref 8–23)
CALCIUM: 7.9 mg/dL — AB (ref 8.9–10.3)
CO2: 38 mmol/L — ABNORMAL HIGH (ref 22–32)
CREATININE: 1.08 mg/dL — AB (ref 0.44–1.00)
Chloride: 95 mmol/L — ABNORMAL LOW (ref 98–111)
GFR calc Af Amer: 53 mL/min — ABNORMAL LOW (ref >60)
GFR, EST NON AFRICAN AMERICAN: 45 mL/min — AB (ref >60)
Glucose, Bld: 106 mg/dL — ABNORMAL HIGH (ref 70–99)
Potassium: 3.2 mmol/L — ABNORMAL LOW (ref 3.5–5.1)
SODIUM: 144 mmol/L (ref 135–145)

## 2018-03-16 MED ORDER — FUROSEMIDE 10 MG/ML IJ SOLN: 40.0000 mg | Freq: Two times a day (BID) | INTRAMUSCULAR | Status: DC

## 2018-03-16 NOTE — Progress Notes (Signed)
Pt has declined use of CPAP QHS, pt states that she will resume tomorrow night when son brings in home mask and tubing.  RT to monitor and assess as needed.

## 2018-03-16 NOTE — Progress Notes (Signed)
PROGRESS NOTE    Tamara Morales  CLE:[id number] DOB: [birth year] DOA: 03/14/2018 PCP: Blanchie Serve, MD    Brief Narrative:  84 y.o. female with medical history significant of diastolic dysfunction CHF, hyperlipidemia, hypertension, morbid obesity, history of breast cancer and diabetes who presents to the ER with worsening shortness of breath for about a week.  She has bilateral lymphedema which has also worsened.  She has noted orthopnea with PND as well as exertional dyspnea.  Patient is on oral Lasix.  She lives in an assisted living facility.  She is not able to take cardiac diet.  She is regularly at the cafeteria.  Patient denied any excess salt intake.  Denied any chest pain.  Patient appears to have fluid overload and an acute on chronic diastolic heart failure so she is being admitted for treatment.  ED Course: Temperature is 98 to blood pressure 101/43, pulse 85 respiratory 2 oxygen sat 82% on room air.  White count is 7.8 hemoglobin 13.1 and platelets of 151.  She has a sodium 141 potassium 3.0 chloride 95 CO2 34 BUN 25 creatinine 1.16 calcium 8.1 and glucose 138.  Urinalysis showed moderate leukocyte Estrace negative nitrite.  Many bacteria and WBC is 21-50.  Chest x-ray showed cardiomegaly with mild CHF.  Patient received IV Lasix in the ER and is being admitted for treatment.  Assessment & Plan:   Principal Problem:   Acute on chronic diastolic (congestive) heart failure (HCC) Active Problems:   Hypothyroidism (acquired)   Morbid obesity (HCC)   Essential hypertension   Depression with anxiety   UTI (urinary tract infection)   Hypokalemia  #1 acute on chronic diastolic heart failure:  -Patient reports feeling somewhat improved, tolerating lasix -Repeat 2d echo performed, normal LVEF with mild diastolic dysfunction -LE edema much improved however pt remains O2 dependent, suggesting continued volume overload -Place pt on 1500cc fluid restriction -Consult dietician for  CHF nutrition education -Continue BID IV lasix  #2 UTI ruled out -Asymptomatic bacturia -UA and culture suggesting UTI, however patient is asymptomatic -Remains stable off abx  #3 hypothyroidism:  -Stable at present -tolerating thyroid replacement  #4 essential hypertension:  -Continue with blood pressure medications as tolerated -Remains stable at present  #5 hypokalemia:  -Replacement ordered -Recheck bmet in AM  #6 morbid obesity:  -Recommend diet/lifestyle modification -Stable at present  #7 depression with anxiety:  -Continue with home regimen. -Presently stable  DVT prophylaxis: Heparin subQ Code Status: Full Family Communication: Pt in room, family at bedside Disposition Plan: Uncertain at this time  Consultants:     Procedures:     Antimicrobials: Anti-infectives (From admission, onward)   Start     Dose/Rate Route Frequency Ordered Stop   03/15/18 2200  cefTRIAXone (ROCEPHIN) 1 g in sodium chloride 0.9 % 100 mL IVPB  Status:  Discontinued     1 g 200 mL/hr over 30 Minutes Intravenous Every 24 hours 03/15/18 0131 03/15/18 1124   03/14/18 2215  cefTRIAXone (ROCEPHIN) 1 g in sodium chloride 0.9 % 100 mL IVPB     1 g 200 mL/hr over 30 Minutes Intravenous  Once 03/14/18 2202 03/14/18 2317      Subjective: Questioning about going home  Objective: Vitals:   03/15/18 2118 03/16/18 0356 03/16/18 0407 03/16/18 1357  BP: 112/66  (!) 100/54 (!) 96/48  Pulse: 86  72 86  Resp: $Remo'18  20 18  'WLluH$ Temp: 99.3 F (37.4 C)  98.7 F (37.1 C) 97.7 F (36.5 C)  TempSrc:  Oral  Oral Oral  SpO2: 90%  95% 94%  Weight:  130.9 kg    Height:        Intake/Output Summary (Last 24 hours) at 03/16/2018 1709 Last data filed at 03/16/2018 1000 Gross per 24 hour  Intake 483 ml  Output 2200 ml  Net -1717 ml   Filed Weights   03/14/18 2349 03/15/18 0536 03/16/18 0356  Weight: 133 kg 132 kg 130.9 kg    Examination: General exam: Conversant, in no acute  distress Respiratory system: normal chest rise, clear, no audible wheezing, remains on [place] Cardiovascular system: regular rhythm, s1-s2 Gastrointestinal system: Nondistended, nontender, pos BS Central nervous system: No seizures, no tremors Extremities: No cyanosis, no joint deformities, BLE edema, improved Skin: No rashes, no pallor Psychiatry: Affect normal // no auditory hallucinations   Data Reviewed: I have personally reviewed following labs and imaging studies  CBC: Recent Labs  Lab 03/14/18 1658 03/15/18 0540  WBC 7.8 6.8  NEUTROABS  --  4.7  HGB 13.1 12.3  HCT 40.7 39.0  MCV 92.5 92.6  PLT 151 614*   Basic Metabolic Panel: Recent Labs  Lab 03/14/18 1658 03/15/18 0540 03/16/18 0453  NA 141 142 144  K 3.0* 2.9* 3.2*  CL 95* 96* 95*  CO2 34* 34* 38*  GLUCOSE 138* 116* 106*  BUN 24* 22 26*  CREATININE 1.16* 0.98 1.08*  CALCIUM 8.1* 7.8* 7.9*   GFR: Estimated Creatinine Clearance: 52.1 mL/min (A) (by C-G formula based on SCr of 1.08 mg/dL (H)). Liver Function Tests: Recent Labs  Lab 03/14/18 1701 03/15/18 0540  AST 21 14*  ALT 15 13  ALKPHOS 90 74  BILITOT 0.6 0.4  PROT 6.8 6.1*  ALBUMIN 3.4* 3.0*   No results for input(s): LIPASE, AMYLASE in the last 168 hours. No results for input(s): AMMONIA in the last 168 hours. Coagulation Profile: No results for input(s): INR, PROTIME in the last 168 hours. Cardiac Enzymes: No results for input(s): CKTOTAL, CKMB, CKMBINDEX, TROPONINI in the last 168 hours. BNP (last 3 results) No results for input(s): PROBNP in the last 8760 hours. HbA1C: No results for input(s): HGBA1C in the last 72 hours. CBG: Recent Labs  Lab 03/14/18 1657  GLUCAP 126*   Lipid Profile: No results for input(s): CHOL, HDL, LDLCALC, TRIG, CHOLHDL, LDLDIRECT in the last 72 hours. Thyroid Function Tests: No results for input(s): TSH, T4TOTAL, FREET4, T3FREE, THYROIDAB in the last 72 hours. Anemia Panel: No results for input(s):  VITAMINB12, FOLATE, FERRITIN, TIBC, IRON, RETICCTPCT in the last 72 hours. Sepsis Labs: Recent Labs  Lab 03/14/18 1710  LATICACIDVEN 1.66    Recent Results (from the past 240 hour(s))  Urine culture     Status: Abnormal (Preliminary result)   Collection Time: 03/14/18  6:36 PM  Result Value Ref Range Status   Specimen Description   Final    URINE, CLEAN CATCH Performed at [facility] [address] [place], [place] [address]    Special Requests   Final    NONE Performed at [facility], [place] [address], [place], [place] [address]    Culture >=100,000 COLONIES/mL ESCHERICHIA COLI (A)  Final   Report Status PENDING  Incomplete  MRSA PCR Screening     Status: None   Collection Time: 03/15/18  3:16 AM  Result Value Ref Range Status   MRSA by PCR NEGATIVE NEGATIVE Final    Comment:        The GeneXpert MRSA Assay (FDA  approved for NASAL specimens only), is one component of a comprehensive MRSA colonization surveillance program. It is not intended to diagnose MRSA infection nor to guide or monitor treatment for MRSA infections. Performed at [facility], [place] [address], [place], [place] [address]      Radiology Studies: Dg Chest 2 View  Result Date: 03/14/2018 CLINICAL DATA:  Weakness and dyspnea EXAM: CHEST - 2 VIEW COMPARISON:  07/16/2017 CT and CXR 01/12/2017 FINDINGS: Cardiomegaly with mild pulmonary vascular redistribution consistent with CHF. No pulmonary consolidation, effusion or pneumothorax. Tortuous atherosclerotic aorta. Surgical clips are seen in the right axilla. No acute osseous abnormality is noted. Degenerative changes are present along the dorsal spine. IMPRESSION: Cardiomegaly with mild CHF. Aortic atherosclerosis. Electronically Signed   By: Ashley Royalty M.D.   On: 03/14/2018 17:53    Scheduled Meds: . aspirin EC  81 mg Oral Daily  . [START ON 03/17/2018] furosemide  40 mg Intravenous BID  .  heparin  5,000 Units Subcutaneous Q8H  . levothyroxine  125 mcg Oral QAC breakfast  . loratadine  10 mg Oral QHS  . losartan  100 mg Oral Daily  . metolazone  2.5 mg Oral Daily  . pantoprazole  40 mg Oral Daily  . polyethylene glycol  17 g Oral Daily  . potassium chloride SA  60 mEq Oral BID  . sertraline  50 mg Oral Daily  . sodium chloride flush  3 mL Intravenous Q12H   Continuous Infusions: . sodium chloride       LOS: 2 days   Marylu Lund, MD [facility] Pager On Amion  If 7PM-7AM, please contact night-coverage 03/16/2018, 5:09 PM

## 2018-03-17 LAB — BASIC METABOLIC PANEL
Anion gap: 10 (ref 5–15)
BUN: 24 mg/dL — AB (ref 8–23)
CALCIUM: 8.2 mg/dL — AB (ref 8.9–10.3)
CHLORIDE: 95 mmol/L — AB (ref 98–111)
CO2: 38 mmol/L — ABNORMAL HIGH (ref 22–32)
CREATININE: 0.99 mg/dL (ref 0.44–1.00)
GFR calc Af Amer: 59 mL/min — ABNORMAL LOW (ref >60)
GFR calc non Af Amer: 51 mL/min — ABNORMAL LOW (ref >60)
Glucose, Bld: 112 mg/dL — ABNORMAL HIGH (ref 70–99)
Potassium: 4.1 mmol/L (ref 3.5–5.1)
SODIUM: 143 mmol/L (ref 135–145)

## 2018-03-17 LAB — URINE CULTURE

## 2018-03-17 MED ORDER — FUROSEMIDE 10 MG/ML IJ SOLN
60.0000 mg | Freq: Two times a day (BID) | INTRAMUSCULAR | Status: DC
  Administered 2018-03-17 (×2): 60 mg via INTRAVENOUS
  Filled 2018-03-17 (×2): qty 6

## 2018-03-17 MED ORDER — CYCLOBENZAPRINE HCL 5 MG PO TABS
5.0000 mg | ORAL_TABLET | Freq: Three times a day (TID) | ORAL | Status: DC | PRN
  Administered 2018-03-17: 5 mg via ORAL
  Filled 2018-03-17: qty 1

## 2018-03-17 NOTE — Progress Notes (Signed)
PROGRESS NOTE    Tamara Morales  YTK:[id number] DOB: [birth year] DOA: 03/14/2018 PCP: Blanchie Serve, MD    Brief Narrative:  84 y.o. female with medical history significant of diastolic dysfunction CHF, hyperlipidemia, hypertension, morbid obesity, history of breast cancer and diabetes who presents to the ER with worsening shortness of breath for about a week.  She has bilateral lymphedema which has also worsened.  She has noted orthopnea with PND as well as exertional dyspnea.  Patient is on oral Lasix.  She lives in an assisted living facility.  She is not able to take cardiac diet.  She is regularly at the cafeteria.  Patient denied any excess salt intake.  Denied any chest pain.  Patient appears to have fluid overload and an acute on chronic diastolic heart failure so she is being admitted for treatment.  ED Course: Temperature is 98 to blood pressure 101/43, pulse 85 respiratory 2 oxygen sat 82% on room air.  White count is 7.8 hemoglobin 13.1 and platelets of 151.  She has a sodium 141 potassium 3.0 chloride 95 CO2 34 BUN 25 creatinine 1.16 calcium 8.1 and glucose 138.  Urinalysis showed moderate leukocyte Estrace negative nitrite.  Many bacteria and WBC is 21-50.  Chest x-ray showed cardiomegaly with mild CHF.  Patient received IV Lasix in the ER and is being admitted for treatment.  Assessment & Plan:   Principal Problem:   Acute on chronic diastolic (congestive) heart failure (HCC) Active Problems:   Hypothyroidism (acquired)   Morbid obesity (HCC)   Essential hypertension   Depression with anxiety   UTI (urinary tract infection)   Hypokalemia  #1 acute on chronic diastolic heart failure:  -Patient reports feeling somewhat improved, tolerating lasix -Repeat 2d echo performed, normal LVEF with mild diastolic dysfunction -LE edema much improved however pt remains O2 dependent, suggesting continued volume overload  -Patient does have continued LE edema on exam -Wt down to  130kg from 133kg from admit -Continue on 1500cc fluid restriction -Consulted dietician for CHF nutrition education -Given concerns of continued volume overload, increase lasix to $Remove'60mg'OfqZGUa$  bid  #2 UTI ruled out -Asymptomatic bacturia -UA and culture suggesting UTI, however patient is asymptomatic -Afebrile and stable off abx  #3 hypothyroidism:  -tolerating thyroid replacement -Currently stable  #4 essential hypertension:  -Continue with blood pressure medications as tolerated -BP stable currently  #5 hypokalemia:  -Replaced -Repeat bmet in AM  #6 morbid obesity:  -Recommend diet/lifestyle modification -Stable at present at this time  #7 depression with anxiety:  -Continue with home regimen. -Currently stable  DVT prophylaxis: Heparin subQ Code Status: Full Family Communication: Pt in room, family at bedside Disposition Plan: Uncertain at this time  Consultants:     Procedures:     Antimicrobials: Anti-infectives (From admission, onward)   Start     Dose/Rate Route Frequency Ordered Stop   03/15/18 2200  cefTRIAXone (ROCEPHIN) 1 g in sodium chloride 0.9 % 100 mL IVPB  Status:  Discontinued     1 g 200 mL/hr over 30 Minutes Intravenous Every 24 hours 03/15/18 0131 03/15/18 1124   03/14/18 2215  cefTRIAXone (ROCEPHIN) 1 g in sodium chloride 0.9 % 100 mL IVPB     1 g 200 mL/hr over 30 Minutes Intravenous  Once 03/14/18 2202 03/14/18 2317      Subjective: Eager to go home  Objective: Vitals:   03/16/18 1357 03/16/18 2112 03/17/18 0509 03/17/18 1309  BP: (!) 96/48 100/60 111/65 (!) 99/42  Pulse: 86 79 91  95  Resp: $Remo'18 18 18 20  'PvLSz$ Temp: 97.7 F (36.5 C) 97.9 F (36.6 C) 97.6 F (36.4 C)   TempSrc: Oral Oral Oral   SpO2: 94% 95% 94% 97%  Weight:   130.1 kg   Height:        Intake/Output Summary (Last 24 hours) at 03/17/2018 1606 Last data filed at 03/17/2018 1304 Gross per 24 hour  Intake 583 ml  Output 1550 ml  Net -967 ml   Filed Weights    03/15/18 0536 03/16/18 0356 03/17/18 0509  Weight: 132 kg 130.9 kg 130.1 kg    Examination: General exam: Awake, laying in bed, in nad Respiratory system: Normal respiratory effort, no wheezing Cardiovascular system: regular rate, s1, s2 Gastrointestinal system: Soft, nondistended, positive BS Central nervous system: CN2-12 grossly intact, strength intact Extremities: Perfused, no clubbing, B LE edema Skin: Normal skin turgor, no notable skin lesions seen Psychiatry: Mood normal // no visual hallucinations   Data Reviewed: I have personally reviewed following labs and imaging studies  CBC: Recent Labs  Lab 03/14/18 1658 03/15/18 0540  WBC 7.8 6.8  NEUTROABS  --  4.7  HGB 13.1 12.3  HCT 40.7 39.0  MCV 92.5 92.6  PLT 151 616*   Basic Metabolic Panel: Recent Labs  Lab 03/14/18 1658 03/15/18 0540 03/16/18 0453 03/17/18 0529  NA 141 142 144 143  K 3.0* 2.9* 3.2* 4.1  CL 95* 96* 95* 95*  CO2 34* 34* 38* 38*  GLUCOSE 138* 116* 106* 112*  BUN 24* 22 26* 24*  CREATININE 1.16* 0.98 1.08* 0.99  CALCIUM 8.1* 7.8* 7.9* 8.2*   GFR: Estimated Creatinine Clearance: 56.5 mL/min (by C-G formula based on SCr of 0.99 mg/dL). Liver Function Tests: Recent Labs  Lab 03/14/18 1701 03/15/18 0540  AST 21 14*  ALT 15 13  ALKPHOS 90 74  BILITOT 0.6 0.4  PROT 6.8 6.1*  ALBUMIN 3.4* 3.0*   No results for input(s): LIPASE, AMYLASE in the last 168 hours. No results for input(s): AMMONIA in the last 168 hours. Coagulation Profile: No results for input(s): INR, PROTIME in the last 168 hours. Cardiac Enzymes: No results for input(s): CKTOTAL, CKMB, CKMBINDEX, TROPONINI in the last 168 hours. BNP (last 3 results) No results for input(s): PROBNP in the last 8760 hours. HbA1C: No results for input(s): HGBA1C in the last 72 hours. CBG: Recent Labs  Lab 03/14/18 1657  GLUCAP 126*   Lipid Profile: No results for input(s): CHOL, HDL, LDLCALC, TRIG, CHOLHDL, LDLDIRECT in the last 72  hours. Thyroid Function Tests: No results for input(s): TSH, T4TOTAL, FREET4, T3FREE, THYROIDAB in the last 72 hours. Anemia Panel: No results for input(s): VITAMINB12, FOLATE, FERRITIN, TIBC, IRON, RETICCTPCT in the last 72 hours. Sepsis Labs: Recent Labs  Lab 03/14/18 1710  LATICACIDVEN 1.66    Recent Results (from the past 240 hour(s))  Urine culture     Status: Abnormal   Collection Time: 03/14/18  6:36 PM  Result Value Ref Range Status   Specimen Description   Final    URINE, CLEAN CATCH Performed at [facility] [address] [place], [place] [address]    Special Requests   Final    NONE Performed at [facility], [place] [address] [place], [place] [address]    Culture >=100,000 COLONIES/mL ESCHERICHIA COLI (A)  Final   Report Status 03/17/2018 FINAL  Final   Organism ID, Bacteria ESCHERICHIA COLI (A)  Final      Susceptibility  Escherichia coli - MIC*    AMPICILLIN <=2 SENSITIVE Sensitive     CEFAZOLIN <=4 SENSITIVE Sensitive     CEFTRIAXONE <=1 SENSITIVE Sensitive     CIPROFLOXACIN <=0.25 SENSITIVE Sensitive     GENTAMICIN <=1 SENSITIVE Sensitive     IMIPENEM <=0.25 SENSITIVE Sensitive     NITROFURANTOIN <=16 SENSITIVE Sensitive     TRIMETH/SULFA <=20 SENSITIVE Sensitive     AMPICILLIN/SULBACTAM <=2 SENSITIVE Sensitive     PIP/TAZO <=4 SENSITIVE Sensitive     Extended ESBL NEGATIVE Sensitive     * >=100,000 COLONIES/mL ESCHERICHIA COLI  MRSA PCR Screening     Status: None   Collection Time: 03/15/18  3:16 AM  Result Value Ref Range Status   MRSA by PCR NEGATIVE NEGATIVE Final    Comment:        The GeneXpert MRSA Assay (FDA approved for NASAL specimens only), is one component of a comprehensive MRSA colonization surveillance program. It is not intended to diagnose MRSA infection nor to guide or monitor treatment for MRSA infections. Performed at [facility], [place] [address] [place], [place]  [address]      Radiology Studies: No results found.  Scheduled Meds: . aspirin EC  81 mg Oral Daily  . furosemide  60 mg Intravenous BID  . heparin  5,000 Units Subcutaneous Q8H  . levothyroxine  125 mcg Oral QAC breakfast  . loratadine  10 mg Oral QHS  . losartan  100 mg Oral Daily  . metolazone  2.5 mg Oral Daily  . pantoprazole  40 mg Oral Daily  . polyethylene glycol  17 g Oral Daily  . potassium chloride SA  60 mEq Oral BID  . sertraline  50 mg Oral Daily  . sodium chloride flush  3 mL Intravenous Q12H   Continuous Infusions: . sodium chloride       LOS: 3 days   Marylu Lund, MD [facility] Pager On Amion  If 7PM-7AM, please contact night-coverage 03/17/2018, 4:06 PM

## 2018-03-18 ENCOUNTER — Encounter (HOSPITAL_COMMUNITY): Payer: Self-pay

## 2018-03-18 LAB — BASIC METABOLIC PANEL
ANION GAP: 10 (ref 5–15)
BUN: 24 mg/dL — ABNORMAL HIGH (ref 8–23)
CALCIUM: 8.3 mg/dL — AB (ref 8.9–10.3)
CO2: 40 mmol/L — AB (ref 22–32)
Chloride: 92 mmol/L — ABNORMAL LOW (ref 98–111)
Creatinine, Ser: 1.25 mg/dL — ABNORMAL HIGH (ref 0.44–1.00)
GFR calc Af Amer: 44 mL/min — ABNORMAL LOW (ref >60)
GFR calc non Af Amer: 38 mL/min — ABNORMAL LOW (ref >60)
Glucose, Bld: 112 mg/dL — ABNORMAL HIGH (ref 70–99)
POTASSIUM: 4.3 mmol/L (ref 3.5–5.1)
Sodium: 142 mmol/L (ref 135–145)

## 2018-03-18 MED ORDER — FUROSEMIDE 40 MG PO TABS
60.0000 mg | ORAL_TABLET | Freq: Every day | ORAL | Status: DC
  Administered 2018-03-18: 60 mg via ORAL
  Filled 2018-03-18: qty 1

## 2018-03-18 NOTE — Progress Notes (Signed)
Nutrition Education Note  RD consulted for nutrition education regarding CHF, low sodium diet.  RD provided "Low Sodium Nutrition Therapy" and "Sodium-Free Flavoring Tips" handouts from the Academy of [employer] and Dietetics. Reviewed patient's dietary recall. Provided examples on ways to decrease sodium intake in diet. Discouraged intake of processed foods and use of salt shaker. Encouraged fresh fruits and vegetables as well as whole grain sources of carbohydrates to maximize fiber intake.   RD discussed why it is important for patient to adhere to diet recommendations, and emphasized the role of fluids and foods to avoid. Teach back method used.  Patient lives in an independent living facility. She has a kitchen in her apartment but mainly goes to the cafeteria for her meals. At meals, there are 2 entree options. Patient reports that all foods are heavily salted prior to the residents receiving them. Talked with her about choosing the less salty of the two options and keeping fresh fruits, vegetables, and items such as oatmeal and eggs in her apartment to have in addition to what she has in the cafeteria for meals or as a snack. Patient hopes to talk with the staff at the facility to ask if changes can be made to how food is prepared.   Expect fair compliance (d/t food options at facility).  Body mass index is 47.25 kg/m. Pt meets criteria for morbid obesity based on current BMI.  Current diet order is Heart Healthy, patient is consuming approximately 100% of meals at this time. Labs and medications reviewed. No further nutrition interventions warranted at this time. RD contact information provided. If additional nutrition issues arise, please re-consult RD.     Jarome Matin, MS, RD, LDN, [facility] Inpatient Clinical Dietitian Pager # [phone] After hours/weekend pager # [phone]

## 2018-03-18 NOTE — Discharge Summary (Signed)
Physician Discharge Summary  Tamara Morales WCH:[id number] DOB: 1931-05-29 DOA: 03/14/2018  PCP: Blanchie Serve, MD  Admit date: 03/14/2018 Discharge date: 03/18/2018  Admitted From: Home Disposition:  Home  Recommendations for Outpatient Follow-up:  1. Follow up with PCP in 1-2 weeks 2. Please obtain BMP in one week, follow up with heart failure team  Discharge Condition:Improved CODE STATUS:Full Diet recommendation: Heart healthy   Brief/Interim Summary: 84 y.o.femalewith medical history significant ofdiastolic dysfunction CHF, hyperlipidemia, hypertension, morbid obesity, history of breast cancer and diabetes who presents to the ER with worsening shortness of breath for about a week. She has bilateral lymphedema which has also worsened. She has noted orthopnea with PND as well as exertional dyspnea. Patient is on oral Lasix. She lives in an assisted living facility. She is not able to take cardiac diet. She is regularly at the cafeteria. Patient denied any excess salt intake. Denied any chest pain. Patient appears to have fluid overload and an acute on chronic diastolic heart failure so she is being admitted for treatment.  ED Course:Temperature is 98 to blood pressure 101/43, pulse 85 respiratory 2 oxygen sat 82% on room air. White count is 7.8 hemoglobin 13.1 and platelets of 151. She has a sodium 141 potassium 3.0 chloride 95 CO2 34 BUN 25 creatinine 1.16 calcium 8.1 and glucose 138. Urinalysis showed moderate leukocyte Estrace negative nitrite. Many bacteria and WBC is 21-50. Chest x-ray showed cardiomegaly with mild CHF. Patient received IV Lasix in the ER and is being admitted for treatment.  #1 acute on chronic diastolic heart failure: -Repeat 2d echo performed, normal LVEF with mild diastolic dysfunction -LE edema much improved however pt remains O2 dependent, suggesting continued volume overload  -Patient does have continued LE edema on exam -Wt down to  128.8kg from 133kg from admit -Continued on 1500cc fluid restriction -Consulted dietician for CHF nutrition education -Cr increased with IV lasix, thus transitioned to PO home regimen -Arrange home health PT with CHF protocol for heart failure as outpatient  #2 UTI ruled out -Asymptomatic bacturia -UA and culture suggesting UTI, however patient is asymptomatic -Afebrile and stable off abx  #3 hypothyroidism: -tolerating thyroid replacement -Currently stable  #4 essential hypertension: -Continue with blood pressure medications as tolerated -BP stable currently  #5 hypokalemia: -Replaced -Repeat bmet in AM  #6 morbid obesity: -Recommend diet/lifestyle modification -Stable at present at this time  #7 depression with anxiety: -Continue with home regimen. -Currently stable   Discharge Diagnoses:  Principal Problem:   Acute on chronic diastolic (congestive) heart failure (HCC) Active Problems:   Hypothyroidism (acquired)   Morbid obesity ([place])   Essential hypertension   Depression with anxiety   UTI (urinary tract infection)   Hypokalemia    Discharge Instructions   Allergies as of 03/18/2018      Reactions   Azithromycin Itching   Beta Adrenergic Blockers    Depression   Ciprofloxacin Other (See Comments)   REACTION: edgy and very jumpy    Codeine Nausea And Vomiting   Epinephrine Other (See Comments)   REACTION: rapid pulse, sweats   Klonopin [clonazepam] Other (See Comments)   Makes her feel very suicidal    Oxycodone Other (See Comments)   Patient felt like it altered her mental status "Crazy" . Hallucinations later as well.   Paxil [paroxetine Hydrochloride] Other (See Comments)   Sever depression    Prednisone    "makes me feel really bad"   Propoxyphene Hcl Nausea And Vomiting   Torsemide  Tramadol Other (See Comments)   Feels "jittery"   Vicodin [hydrocodone-acetaminophen] Other (See Comments)   Hallucinations   Meloxicam Diarrhea    Vancomycin Rash   Localized rash related to infusion rate.      Medication List    STOP taking these medications   hydrochlorothiazide 25 MG tablet Commonly known as:  HYDRODIURIL     TAKE these medications   acetaminophen 325 MG tablet Commonly known as:  TYLENOL Take 650 mg by mouth every 8 (eight) hours as needed for mild pain or moderate pain.   ALPRAZolam 0.5 MG tablet Commonly known as:  XANAX TAKE 1 TABLET TWICE DAILY AS NEEDED FOR ANXIETY. What changed:  See the new instructions.   aspirin 81 MG tablet Take 81 mg by mouth daily.   furosemide 40 MG tablet Commonly known as:  LASIX Take 1.5 tablets (60 mg total) by mouth daily. Along with metolazone and potassium supplement   levothyroxine 125 MCG tablet Commonly known as:  SYNTHROID, LEVOTHROID Take 1 tablet (125 mcg total) by mouth daily.   loratadine 10 MG tablet Commonly known as:  CLARITIN Take 10 mg by mouth at bedtime.   losartan 100 MG tablet Commonly known as:  COZAAR TAKE 1 TABLET ONCE DAILY. HOLD FOR SYSTOLIC BLOOD PRESSURE LESS THAN 110. What changed:  See the new instructions.   metolazone 2.5 MG tablet Commonly known as:  ZAROXOLYN Take 1 tablet (2.5 mg total) by mouth daily.   omeprazole 20 MG capsule Commonly known as:  PRILOSEC TAKE (1) CAPSULE DAILY. What changed:  See the new instructions.   ondansetron 4 MG tablet Commonly known as:  ZOFRAN TAKE (1) TABLET BY MOUTH EVERY SIX HOURS AS NEEDED FOR NAUSEA. What changed:  See the new instructions.   potassium chloride SA 20 MEQ tablet Commonly known as:  K-DUR,KLOR-CON Take 60 mEq by mouth 2 (two) times daily.   sertraline 50 MG tablet Commonly known as:  ZOLOFT Take 1 tablet (50 mg total) by mouth daily. With 25 mg daily for total of 75 mg daily What changed:  additional instructions            Durable Medical Equipment  (From admission, onward)         Start     Ordered   03/18/18 1615  Heart failure home health  orders  (Heart failure home health orders / Face to face)  Once    Comments:  Heart Failure Follow-up Care:  Verify follow-up appointments per Patient Discharge Instructions. Confirm transportation arranged. Reconcile home medications with discharge medication list. Remove discontinued medications from use. Assist patient/caregiver to manage medications using pill box. Reinforce low sodium food selection Assessments: Vital signs and oxygen saturation at each visit. Assess home environment for safety concerns, caregiver support and availability of low-sodium foods. Consult Education officer, museum, PT/OT, Dietitian, and CNA based on assessments. Perform comprehensive cardiopulmonary assessment. Notify MD for any change in condition or weight gain of 3 pounds in one day or 5 pounds in one week with symptoms. Daily Weights and Symptom Monitoring:  Follow up labs with PCP Ensure patient has access to scales. Teach patient/caregiver to weigh daily before breakfast and after voiding using same scale and record.    Teach patient/caregiver to track weight and symptoms and when to notify Provider. Activity: Develop individualized activity plan with patient/caregiver.  Question Answer Comment  Heart Failure Follow-up Care Or per Doctor (see comments)   Obtain the following labs Basic Metabolic Panel   Lab frequency  Weekly   Fax lab results to Other see comments   Diet Low Sodium Heart Healthy   Fluid restrictions: 1500 mL Fluid      03/18/18 1619         Follow-up Information    [facility] Follow up.   Specialty:  Home Health Services Why:  [facility] nursing-chf protocal Contact information: [address] [place] [address] [phone]        Oneal Grout, MD. Schedule an appointment as soon as possible for a visit in 1 week(s).   Specialty:  Internal Medicine Contact information: [address] [place] [address] [phone]          Allergies  Allergen  Reactions  . Azithromycin Itching  . Beta Adrenergic Blockers     Depression   . Ciprofloxacin Other (See Comments)    REACTION: edgy and very jumpy   . Codeine Nausea And Vomiting  . Epinephrine Other (See Comments)    REACTION: rapid pulse, sweats  . Klonopin [Clonazepam] Other (See Comments)    Makes her feel very suicidal   . Oxycodone Other (See Comments)    Patient felt like it altered her mental status "Crazy" . Hallucinations later as well.  [name] Paxil [Paroxetine Hydrochloride] Other (See Comments)    Sever depression   . Prednisone     "makes me feel really bad"  . Propoxyphene Hcl Nausea And Vomiting  . Torsemide   . Tramadol Other (See Comments)    Feels "jittery"  . Vicodin [Hydrocodone-Acetaminophen] Other (See Comments)    Hallucinations  . Meloxicam Diarrhea  . Vancomycin Rash    Localized rash related to infusion rate.     Procedures/Studies: Dg Chest 2 View  Result Date: 03/14/2018 CLINICAL DATA:  Weakness and dyspnea EXAM: CHEST - 2 VIEW COMPARISON:  07/16/2017 CT and CXR 01/12/2017 FINDINGS: Cardiomegaly with mild pulmonary vascular redistribution consistent with CHF. No pulmonary consolidation, effusion or pneumothorax. Tortuous atherosclerotic aorta. Surgical clips are seen in the right axilla. No acute osseous abnormality is noted. Degenerative changes are present along the dorsal spine. IMPRESSION: Cardiomegaly with mild CHF. Aortic atherosclerosis. Electronically Signed   By: Tollie Eth M.D.   On: 03/14/2018 17:53     Subjective: Eager to go home  Discharge Exam: Vitals:   03/18/18 1400 03/18/18 1435  BP: (!) 96/59   Pulse:    Resp:    Temp:    SpO2:  92%   Vitals:   03/18/18 0858 03/18/18 1256 03/18/18 1400 03/18/18 1435  BP: 115/61 96/72 (!) 96/59   Pulse: 80 89    Resp:  16    Temp:  98.1 F (36.7 C)    TempSrc:  Oral    SpO2:  94%  92%  Weight:      Height:        General: Pt is alert, awake, not in acute  distress Cardiovascular: RRR, S1/S2 +, no rubs, no gallops Respiratory: CTA bilaterally, no wheezing, no rhonchi Abdominal: Soft, NT, ND, bowel sounds + Extremities: no edema, no cyanosis   The results of significant diagnostics from this hospitalization (including imaging, microbiology, ancillary and laboratory) are listed below for reference.     Microbiology: Recent Results (from the past 240 hour(s))  Urine culture     Status: Abnormal   Collection Time: 03/14/18  6:36 PM  Result Value Ref Range Status   Specimen Description   Final    URINE, CLEAN CATCH Performed at [facility]  [place] [address] [place], [place] [address]    Special Requests   Final    NONE Performed at [facility], [place] [address] [place], [place] [address]    Culture >=100,000 COLONIES/mL ESCHERICHIA COLI (A)  Final   Report Status 03/17/2018 FINAL  Final   Organism ID, Bacteria ESCHERICHIA COLI (A)  Final      Susceptibility   Escherichia coli - MIC*    AMPICILLIN <=2 SENSITIVE Sensitive     CEFAZOLIN <=4 SENSITIVE Sensitive     CEFTRIAXONE <=1 SENSITIVE Sensitive     CIPROFLOXACIN <=0.25 SENSITIVE Sensitive     GENTAMICIN <=1 SENSITIVE Sensitive     IMIPENEM <=0.25 SENSITIVE Sensitive     NITROFURANTOIN <=16 SENSITIVE Sensitive     TRIMETH/SULFA <=20 SENSITIVE Sensitive     AMPICILLIN/SULBACTAM <=2 SENSITIVE Sensitive     PIP/TAZO <=4 SENSITIVE Sensitive     Extended ESBL NEGATIVE Sensitive     * >=100,000 COLONIES/mL ESCHERICHIA COLI  MRSA PCR Screening     Status: None   Collection Time: 03/15/18  3:16 AM  Result Value Ref Range Status   MRSA by PCR NEGATIVE NEGATIVE Final    Comment:        The GeneXpert MRSA Assay (FDA approved for NASAL specimens only), is one component of a comprehensive MRSA colonization surveillance program. It is not intended to diagnose MRSA infection nor to guide or monitor treatment for MRSA infections. Performed at  [facility] [place] [address], [place], [place] [address]      Labs: BNP (last 3 results) Recent Labs    03/14/18 1701  BNP 379.0*   Basic Metabolic Panel: Recent Labs  Lab 03/14/18 1658 03/15/18 0540 03/16/18 0453 03/17/18 0529 03/18/18 0514  NA 141 142 144 143 142  K 3.0* 2.9* 3.2* 4.1 4.3  CL 95* 96* 95* 95* 92*  CO2 34* 34* 38* 38* 40*  GLUCOSE 138* 116* 106* 112* 112*  BUN 24* 22 26* 24* 24*  CREATININE 1.16* 0.98 1.08* 0.99 1.25*  CALCIUM 8.1* 7.8* 7.9* 8.2* 8.3*   Liver Function Tests: Recent Labs  Lab 03/14/18 1701 03/15/18 0540  AST 21 14*  ALT 15 13  ALKPHOS 90 74  BILITOT 0.6 0.4  PROT 6.8 6.1*  ALBUMIN 3.4* 3.0*   No results for input(s): LIPASE, AMYLASE in the last 168 hours. No results for input(s): AMMONIA in the last 168 hours. CBC: Recent Labs  Lab 03/14/18 1658 03/15/18 0540  WBC 7.8 6.8  NEUTROABS  --  4.7  HGB 13.1 12.3  HCT 40.7 39.0  MCV 92.5 92.6  PLT 151 142*   Cardiac Enzymes: No results for input(s): CKTOTAL, CKMB, CKMBINDEX, TROPONINI in the last 168 hours. BNP: Invalid input(s): POCBNP CBG: Recent Labs  Lab 03/14/18 1657  GLUCAP 126*   D-Dimer No results for input(s): DDIMER in the last 72 hours. Hgb A1c No results for input(s): HGBA1C in the last 72 hours. Lipid Profile No results for input(s): CHOL, HDL, LDLCALC, TRIG, CHOLHDL, LDLDIRECT in the last 72 hours. Thyroid function studies No results for input(s): TSH, T4TOTAL, T3FREE, THYROIDAB in the last 72 hours.  Invalid input(s): FREET3 Anemia work up No results for input(s): VITAMINB12, FOLATE, FERRITIN, TIBC, IRON, RETICCTPCT in the last 72 hours. Urinalysis    Component Value Date/Time   COLORURINE STRAW (A) 03/14/2018 1836   APPEARANCEUR CLEAR 03/14/2018 1836   LABSPEC 1.008 03/14/2018 1836   PHURINE 5.0 03/14/2018 1836  GLUCOSEU NEGATIVE 03/14/2018 1836   GLUCOSEU NEGATIVE 04/17/2012 1232   HGBUR NEGATIVE 03/14/2018 1836    BILIRUBINUR NEGATIVE 03/14/2018 1836   BILIRUBINUR neg 12/22/2016 1124   KETONESUR NEGATIVE 03/14/2018 1836   PROTEINUR NEGATIVE 03/14/2018 1836   UROBILINOGEN negative (A) 12/22/2016 1124   UROBILINOGEN 0.2 01/07/2015 1751   NITRITE NEGATIVE 03/14/2018 1836   LEUKOCYTESUR MODERATE (A) 03/14/2018 1836   Sepsis Labs Invalid input(s): PROCALCITONIN,  WBC,  LACTICIDVEN Microbiology Recent Results (from the past 240 hour(s))  Urine culture     Status: Abnormal   Collection Time: 03/14/18  6:36 PM  Result Value Ref Range Status   Specimen Description   Final    URINE, CLEAN CATCH Performed at [facility], [place] [address], [place], [place] [address]    Special Requests   Final    NONE Performed at [facility], [place] [address] [place], [place] [address]    Culture >=100,000 COLONIES/mL ESCHERICHIA COLI (A)  Final   Report Status 03/17/2018 FINAL  Final   Organism ID, Bacteria ESCHERICHIA COLI (A)  Final      Susceptibility   Escherichia coli - MIC*    AMPICILLIN <=2 SENSITIVE Sensitive     CEFAZOLIN <=4 SENSITIVE Sensitive     CEFTRIAXONE <=1 SENSITIVE Sensitive     CIPROFLOXACIN <=0.25 SENSITIVE Sensitive     GENTAMICIN <=1 SENSITIVE Sensitive     IMIPENEM <=0.25 SENSITIVE Sensitive     NITROFURANTOIN <=16 SENSITIVE Sensitive     TRIMETH/SULFA <=20 SENSITIVE Sensitive     AMPICILLIN/SULBACTAM <=2 SENSITIVE Sensitive     PIP/TAZO <=4 SENSITIVE Sensitive     Extended ESBL NEGATIVE Sensitive     * >=100,000 COLONIES/mL ESCHERICHIA COLI  MRSA PCR Screening     Status: None   Collection Time: 03/15/18  3:16 AM  Result Value Ref Range Status   MRSA by PCR NEGATIVE NEGATIVE Final    Comment:        The GeneXpert MRSA Assay (FDA approved for NASAL specimens only), is one component of a comprehensive MRSA colonization surveillance program. It is not intended to diagnose MRSA infection nor to guide or monitor treatment for MRSA  infections. Performed at [facility], [place] [address] [place], [place] [address]    Time spent: 30 min  SIGNED:   Marylu Lund, MD  [facility] 03/18/2018, 4:23 PM  If 7PM-7AM, please contact night-coverage

## 2018-03-18 NOTE — Plan of Care (Addendum)
Pt labs are improving, however, pt's O2 was 87 on RA this am. She was put back on 2 LPM.

## 2018-03-18 NOTE — Progress Notes (Signed)
SATURATION QUALIFICATIONS: (This note is used to comply with regulatory documentation for home oxygen)  Patient Saturations on Room Air at Rest = 92%  Patient Saturations on Room Air while Ambulating = 92%  Patient Saturations on 0 Liters of oxygen while Ambulating =   Please briefly explain why patient needs home oxygen: Patient ambulated without O2 and tolerated well. Did not require oxygen to be placed. Will continue to monitor patient

## 2018-03-18 NOTE — Care Management Note (Signed)
Case Management Note  Patient Details  Name: Tamara Morales MRN: [id number] Date of Birth: 11-Jan-1931  Subjective/Objective: Doesn't qualify for home 02. [facility] aware of d/c home w/Heart failure HHC-chf protocal. No further CM needs.                   Action/Plan:d/c home w/HHC   Expected Discharge Date:                  Expected Discharge Plan:  [place]  In-House Referral:  Clinical Social Work  Discharge planning Services  CM Consult  Post Acute Care Choice:  Durable Medical Equipment(w/c) Choice offered to:  Patient  DME Arranged:    DME Agency:     HH Arranged:    [place] Agency:  [facility] [employer]  Status of Service:  Completed, signed off  If discussed at [employer], dates discussed:    Additional Comments:  Dessa Phi, RN 03/18/2018, 4:25 PM

## 2018-03-18 NOTE — Progress Notes (Signed)
Patient is a resident at [facility]. PT recommendation is for patient to discharge home with [facility].   No social work needs identifed, please reconsult if additional social work needs arise. CSW signing off.  Stephanie Acre, [place] Social Worker [phone]

## 2018-03-18 NOTE — Evaluation (Signed)
Physical Therapy Evaluation Patient Details Name: Tamara Morales MRN: [id number] DOB: [birth year] Today's Date: 03/18/2018   History of Present Illness  84 y.o. female with medical history significant of diastolic dysfunction CHF, hyperlipidemia, hypertension, morbid obesity, history of breast cancer and diabetes who presents to the ER with worsening shortness of breath for about a week. Diagnosed with acute on chronic diastolic heart failure.     Clinical Impression  On eval, pt required Min assist for mobility. She walked ~20 feet with a RW. O2 sat 93% on RA at rest, 87% on RA with short distance ambulation. Sats recovered to 90% within ~1 minute after sitting down in recliner. Discussed d/c plan-pt plan to return to her [facility] apt. She is agreeable to HHPT f/u.     Follow Up Recommendations Home health PT    Equipment Recommendations  None recommended by PT    Recommendations for Other Services       Precautions / Restrictions Precautions Precautions: Fall Precaution Comments: monitor O2 Restrictions Weight Bearing Restrictions: No      Mobility  Bed Mobility Overal bed mobility: Needs Assistance Bed Mobility: Supine to Sit     Supine to sit: HOB elevated;Min assist     General bed mobility comments: Assist for trunk to upright. Increased time. Assist to don shoes.   Transfers Overall transfer level: Needs assistance Equipment used: Rolling walker (2 wheeled) Transfers: Sit to/from Stand Sit to Stand: Min guard;From elevated surface         General transfer comment: Close guard for safety. Pt prefers to pull up on walker despite cueing.   Ambulation/Gait Ambulation/Gait assistance: Min guard Gait Distance (Feet): 20 Feet(15'x1, 20'x1) Assistive device: Rolling walker (2 wheeled) Gait Pattern/deviations: Step-through pattern;Decreased stride length     General Gait Details: Close guard for safety. O2 sat 87% on RA  Stairs             Wheelchair Mobility    Modified Rankin (Stroke Patients Only)       Balance Overall balance assessment: History of Falls;Needs assistance           Standing balance-Leahy Scale: Poor                               Pertinent Vitals/Pain Pain Assessment: No/denies pain    Home Living Family/patient expects to be discharged to:: Private residence Living Arrangements: Alone   Type of Home: Independent living facility       Home Layout: One level Home Equipment: Walker - 2 wheels;Walker - 4 wheels;Cane - single point;Electric scooter      Prior Function Level of Independence: Independent with assistive device(s)         Comments: using power chair primarily. walks very short distances with a RW when necessary     Hand Dominance        Extremity/Trunk Assessment   Upper Extremity Assessment Upper Extremity Assessment: Generalized weakness    Lower Extremity Assessment Lower Extremity Assessment: Generalized weakness RLE Deficits / Details: limited ROm d/t body habitus    Cervical / Trunk Assessment Cervical / Trunk Assessment: Normal  Communication   Communication: No difficulties  Cognition Arousal/Alertness: Awake/alert Behavior During Therapy: WFL for tasks assessed/performed Overall Cognitive Status: Within Functional Limits for tasks assessed  General Comments      Exercises     Assessment/Plan    PT Assessment Patient needs continued PT services  PT Problem List Decreased mobility;Decreased activity tolerance;Decreased balance;Decreased strength;Obesity       PT Treatment Interventions Gait training;Functional mobility training;Therapeutic activities;Balance training;Patient/family education;Therapeutic exercise;DME instruction    PT Goals (Current goals can be found in the Care Plan section)  Acute Rehab PT Goals Patient Stated Goal: home soon PT Goal Formulation:  With patient Time For Goal Achievement: 04/01/18 Potential to Achieve Goals: Fair    Frequency Min 3X/week   Barriers to discharge        Co-evaluation               AM-PAC PT "6 Clicks" Daily Activity  Outcome Measure Difficulty turning over in bed (including adjusting bedclothes, sheets and blankets)?: A Lot Difficulty moving from lying on back to sitting on the side of the bed? : Unable Difficulty sitting down on and standing up from a chair with arms (e.g., wheelchair, bedside commode, etc,.)?: A Little Help needed moving to and from a bed to chair (including a wheelchair)?: A Little Help needed walking in hospital room?: A Little Help needed climbing 3-5 steps with a railing? : Total 6 Click Score: 13    End of Session   Activity Tolerance: Patient limited by fatigue Patient left: in chair;with call bell/phone within reach   PT Visit Diagnosis: Other abnormalities of gait and mobility (R26.89);Muscle weakness (generalized) (M62.81)    Time: 1000-1024 PT Time Calculation (min) (ACUTE ONLY): 24 min   Charges:   PT Evaluation $PT Eval Moderate Complexity: 1 Mod PT Treatments $Gait Training: 8-22 mins          Weston Anna, PT Acute Rehabilitation Services Pager: [phone] Office: [phone]

## 2018-03-18 NOTE — Care Management Note (Signed)
Case Management Note  Patient Details  Name: Tamara Morales MRN: [id number] Date of Birth: Jan 17, 1931  Subjective/Objective: Patient qualifies for Heart failure HHC program through Dr. Tempie Hoist protocal/CHF/rv-Patient/attending agree. [place] rep Ronalee Belts aware of referral, & d/c. PT-no f/u.Noted may need home 02-await 02 sats documented,& home 02 order.                    Action/Plan:dc home w/HHC-chf protocal.   Expected Discharge Date:                  Expected Discharge Plan:  [place]  In-House Referral:  Clinical Social Work  Discharge planning Services  CM Consult  Post Acute Care Choice:  Durable Medical Equipment(w/c) Choice offered to:  Patient  DME Arranged:    DME Agency:     HH Arranged:    [place] Agency:  [facility] [employer]  Status of Service:  In process, will continue to follow  If discussed at Long Length of Stay Meetings, dates discussed:    Additional Comments:  Dessa Phi, RN 03/18/2018, 11:07 AM

## 2018-03-19 ENCOUNTER — Telehealth: Payer: Self-pay | Admitting: *Deleted

## 2018-03-19 ENCOUNTER — Other Ambulatory Visit: Payer: Medicare Other

## 2018-03-19 NOTE — Telephone Encounter (Signed)
The patient was hospitalized 03/14/18-03/18/18. DC summary: BMP one week. Please inform the patient we will schedule BMP 03/26/18, may add more labs if indicated after the appointment 03/21/18. Thank you

## 2018-03-19 NOTE — Telephone Encounter (Signed)
Patient called and stated that she has an appointment on 03/28/18 with [facility] but stated that she had labs that were suppose to be scheduled for today and she went down to have them done and they had no order. Patient wants to know if these should be rescheduled and wants someone to call her back.

## 2018-03-20 ENCOUNTER — Telehealth: Payer: Self-pay

## 2018-03-20 ENCOUNTER — Other Ambulatory Visit: Payer: Self-pay | Admitting: *Deleted

## 2018-03-20 DIAGNOSIS — E876 Hypokalemia: Secondary | ICD-10-CM

## 2018-03-20 NOTE — Telephone Encounter (Signed)
Tamara Morales called and left message on clinical intake that message has been taken care of.

## 2018-03-20 NOTE — Telephone Encounter (Signed)
Transition Care Management Follow-up Telephone Call  Date of discharge and from where: [facility] on 03/18/2018  How have you been since you were released from the hospital? Feeling some weakness. Coughing for last month. No more SOB  Any questions or concerns? Coughing as mentioned above  Items Reviewed:  Did the pt receive and understand the discharge instructions provided? Yes   Medications obtained and verified? Yes . Confirmed all medications and d/c of HCTZ  Any new allergies since your discharge? Yes   Dietary orders reviewed? Yes  Do you have support at home? Yes   Other (ie: DME, Home Health, etc) N/A  Functional Questionnaire: (I = Independent and D = Dependent) ADL's: I  Bathing/Dressing- I   Meal Prep- I w/ assist  Eating- I  Maintaining continence- I  Transferring/Ambulation- I w/ assist of power chair  Managing Meds- I w/ assist   Follow up appointments reviewed:    PCP Hospital f/u appt confirmed? Yes  Scheduled to see Mast NP on 03/28/2018 @ 3pm.  [facility] f/u appt confirmed? N/A  Are transportation arrangements needed? Yes   If their condition worsens, is the pt aware to call  their PCP or go to the ED? Yes  Was the patient provided with contact information for the PCP's office or ED? Yes  Was the pt encouraged to call back with questions or concerns? Yes

## 2018-03-22 DIAGNOSIS — Z6833 Body mass index (BMI) 33.0-33.9, adult: Secondary | ICD-10-CM | POA: Diagnosis not present

## 2018-03-22 DIAGNOSIS — N184 Chronic kidney disease, stage 4 (severe): Secondary | ICD-10-CM | POA: Diagnosis not present

## 2018-03-22 DIAGNOSIS — I5033 Acute on chronic diastolic (congestive) heart failure: Secondary | ICD-10-CM | POA: Diagnosis not present

## 2018-03-22 DIAGNOSIS — Z87440 Personal history of urinary (tract) infections: Secondary | ICD-10-CM | POA: Diagnosis not present

## 2018-03-22 DIAGNOSIS — I130 Hypertensive heart and chronic kidney disease with heart failure and stage 1 through stage 4 chronic kidney disease, or unspecified chronic kidney disease: Secondary | ICD-10-CM | POA: Diagnosis not present

## 2018-03-22 DIAGNOSIS — Z853 Personal history of malignant neoplasm of breast: Secondary | ICD-10-CM | POA: Diagnosis not present

## 2018-03-22 DIAGNOSIS — K219 Gastro-esophageal reflux disease without esophagitis: Secondary | ICD-10-CM | POA: Diagnosis not present

## 2018-03-22 DIAGNOSIS — E662 Morbid (severe) obesity with alveolar hypoventilation: Secondary | ICD-10-CM | POA: Diagnosis not present

## 2018-03-22 DIAGNOSIS — E876 Hypokalemia: Secondary | ICD-10-CM | POA: Diagnosis not present

## 2018-03-22 DIAGNOSIS — Z87891 Personal history of nicotine dependence: Secondary | ICD-10-CM | POA: Diagnosis not present

## 2018-03-22 DIAGNOSIS — I890 Lymphedema, not elsewhere classified: Secondary | ICD-10-CM

## 2018-03-22 DIAGNOSIS — F418 Other specified anxiety disorders: Secondary | ICD-10-CM

## 2018-03-22 DIAGNOSIS — G4733 Obstructive sleep apnea (adult) (pediatric): Secondary | ICD-10-CM

## 2018-03-22 DIAGNOSIS — E039 Hypothyroidism, unspecified: Secondary | ICD-10-CM | POA: Diagnosis not present

## 2018-03-22 DIAGNOSIS — E1122 Type 2 diabetes mellitus with diabetic chronic kidney disease: Secondary | ICD-10-CM | POA: Diagnosis not present

## 2018-03-25 ENCOUNTER — Ambulatory Visit: Payer: Self-pay | Admitting: Pulmonary Disease

## 2018-03-25 ENCOUNTER — Other Ambulatory Visit: Payer: Self-pay | Admitting: Nurse Practitioner

## 2018-03-25 ENCOUNTER — Telehealth: Payer: Self-pay | Admitting: Pulmonary Disease

## 2018-03-25 DIAGNOSIS — E876 Hypokalemia: Secondary | ICD-10-CM | POA: Diagnosis not present

## 2018-03-25 NOTE — Progress Notes (Signed)
error 

## 2018-03-25 NOTE — Telephone Encounter (Signed)
Spoke with pt. She is needing to reschedule her appointment with Dr. Halford Chessman. This has been taken care of for the pt. Nothing further was needed.

## 2018-03-26 ENCOUNTER — Encounter: Payer: Self-pay | Admitting: Internal Medicine

## 2018-03-26 DIAGNOSIS — E876 Hypokalemia: Secondary | ICD-10-CM

## 2018-03-26 LAB — BASIC METABOLIC PANEL
BUN/Creatinine Ratio: 30 (calc) — ABNORMAL HIGH (ref 6–22)
BUN: 30 mg/dL — AB (ref 7–25)
CALCIUM: 8.4 mg/dL — AB (ref 8.6–10.4)
CO2: 24 mmol/L (ref 20–32)
Chloride: 100 mmol/L (ref 98–110)
Creat: 1.01 mg/dL — ABNORMAL HIGH (ref 0.60–0.88)
GLUCOSE: 98 mg/dL (ref 65–99)
Potassium: 3.8 mmol/L (ref 3.5–5.3)
Sodium: 137 mmol/L (ref 135–146)

## 2018-03-27 NOTE — Telephone Encounter (Signed)
Medication was checked on the [place] PMP site and patient has stayed at same pharmacy.

## 2018-03-28 ENCOUNTER — Encounter: Payer: Self-pay | Admitting: Nurse Practitioner

## 2018-03-28 ENCOUNTER — Non-Acute Institutional Stay: Payer: Medicare Other | Admitting: Nurse Practitioner

## 2018-03-28 DIAGNOSIS — I5033 Acute on chronic diastolic (congestive) heart failure: Secondary | ICD-10-CM | POA: Diagnosis not present

## 2018-03-28 DIAGNOSIS — I10 Essential (primary) hypertension: Secondary | ICD-10-CM | POA: Diagnosis not present

## 2018-03-28 DIAGNOSIS — E669 Obesity, unspecified: Secondary | ICD-10-CM | POA: Diagnosis not present

## 2018-03-28 DIAGNOSIS — E1169 Type 2 diabetes mellitus with other specified complication: Secondary | ICD-10-CM

## 2018-03-28 DIAGNOSIS — K219 Gastro-esophageal reflux disease without esophagitis: Secondary | ICD-10-CM | POA: Diagnosis not present

## 2018-03-28 DIAGNOSIS — E876 Hypokalemia: Secondary | ICD-10-CM | POA: Diagnosis not present

## 2018-03-28 DIAGNOSIS — E039 Hypothyroidism, unspecified: Secondary | ICD-10-CM

## 2018-03-28 DIAGNOSIS — F418 Other specified anxiety disorders: Secondary | ICD-10-CM | POA: Diagnosis not present

## 2018-03-28 NOTE — Patient Instructions (Signed)
BMP done 03/25/18. BMP prior to the appointment. Next appt:  06/06/2018

## 2018-03-28 NOTE — Assessment & Plan Note (Addendum)
03/15/18 Echocardiogram: EF 65-70%, clinically compensated, continue Furosemide 40mg  qd, Metolazone 2.5mg  qd, 1500cc/day fluid restriction , f/u Cardiology

## 2018-03-28 NOTE — Progress Notes (Signed)
Location:   clinic [place]   Place of Service:  Clinic (12) Provider: Marlana Latus NP  Code Status: DNR Goals of Care: [place] 03/15/2018  Does Patient Have a Medical Advance Directive? Yes  Type of Advance Directive [place]  Does patient want to make changes to medical advance directive? No - Patient declined  Copy of [place] in Chart? Yes  Would patient like information on creating a medical advance directive? -  Pre-existing out of facility DNR order (yellow form or pink MOST form) Yellow form placed in chart (order not valid for inpatient use);Pink MOST form placed in chart (order not valid for inpatient use)     Chief Complaint  Patient presents with  . Hospitalization Follow-up    HPI: Patient is a 84 y.o. female seen today for hospital follow-up. The patient was hospitalized 5/73/22-0/25/42 for diastolic congestive heart failure, treated with IV lasix,  improved SOB/orhopnea/PND and lymphedema BLE, hypoxemia, will f/u heart failure team. BMP showed serum K (was 3.0 in ED)[facility]. She was placed on 1500cc fluid restriction. 03/15/18 echocardiogram showed EF 65-70%. On Metolazone 2.5mg  qd, Furosemide 60mg  qd, K 20meq bid.   Hx of hyperlipidemia(diet control), HTN(Losartan 100mg  qd), T2DM(diet controlled), hypothyroidism(Levothyroxine 170mcg qd), and depression(Sertraline 50mg  qd, Alprazolam 0.5mg  bid prn). GERD on Omeprazole 20mg  qd.  Past Medical History:  Diagnosis Date  . ARTHRITIS, KNEES, BILATERAL   . Breast CA ([place]) 2000   s/p R lumpectomy and XRT  . DEPRESSION   . DJD (degenerative joint disease) of knee   . GERD   . HYPERLIPIDEMIA   . HYPERTENSION   . HYPOTHYROIDISM    postsurgical  . Hypoxia   . MIGRAINE HEADACHE   . Morbid obesity ([place])   . OSA (obstructive sleep apnea) 01/17/2011 dx  . Oxygen dependent   . Urge incontinence   . UTI (lower urinary tract infection) 12/2014    Past Surgical History:    Procedure Laterality Date  . ABDOMINAL HYSTERECTOMY    . ADENOIDECTOMY    . BREAST BIOPSY  2000  . BREAST LUMPECTOMY  2000  . CATARACT EXTRACTION    . CHOLECYSTECTOMY  1995  . CYSTOSCOPY/URETEROSCOPY/HOLMIUM LASER Right 06/28/2015   Procedure: CYSTOSCOPY RIGHT RETROGRAD RIGHT URETEROSCOPY/HOLMIUM LASER WITH RIGHT STENT PLACEMENT;  Surgeon: Alexis Frock, MD;  Location: [facility];  Service: Urology;  Laterality: Right;  . PARATHYROIDECTOMY     2-3 removed  . REPLACEMENT TOTAL KNEE BILATERAL  1999, 2005  . Right leg femur repaired    . THYROIDECTOMY    . TONSILLECTOMY    . TUBAL LIGATION      Allergies  Allergen Reactions  . Azithromycin Itching  . Beta Adrenergic Blockers     Depression   . Ciprofloxacin Other (See Comments)    REACTION: edgy and very jumpy   . Codeine Nausea And Vomiting  . Epinephrine Other (See Comments)    REACTION: rapid pulse, sweats  . Klonopin [Clonazepam] Other (See Comments)    Makes her feel very suicidal   . Oxycodone Other (See Comments)    Patient felt like it altered her mental status "Crazy" . Hallucinations later as well.  [name] Paxil [Paroxetine Hydrochloride] Other (See Comments)    Sever depression   . Prednisone     "makes me feel really bad"  . Propoxyphene Hcl Nausea And Vomiting  . Torsemide   . Tramadol Other (See Comments)    Feels "jittery"  . Vicodin [  Hydrocodone-Acetaminophen] Other (See Comments)    Hallucinations  . Meloxicam Diarrhea  . Vancomycin Rash    Localized rash related to infusion rate.    Allergies as of 03/28/2018      Reactions   Azithromycin Itching   Beta Adrenergic Blockers    Depression   Ciprofloxacin Other (See Comments)   REACTION: edgy and very jumpy    Codeine Nausea And Vomiting   Epinephrine Other (See Comments)   REACTION: rapid pulse, sweats   Klonopin [clonazepam] Other (See Comments)   Makes her feel very suicidal    Oxycodone Other (See Comments)   Patient felt like it altered her mental  status "Crazy" . Hallucinations later as well.   Paxil [paroxetine Hydrochloride] Other (See Comments)   Sever depression    Prednisone    "makes me feel really bad"   Propoxyphene Hcl Nausea And Vomiting   Torsemide    Tramadol Other (See Comments)   Feels "jittery"   Vicodin [hydrocodone-acetaminophen] Other (See Comments)   Hallucinations   Meloxicam Diarrhea   Vancomycin Rash   Localized rash related to infusion rate.      Medication List        Accurate as of 03/28/18 11:59 PM. Always use your most recent med list.          acetaminophen 325 MG tablet Commonly known as:  TYLENOL Take 650 mg by mouth every 8 (eight) hours as needed for mild pain or moderate pain.   ALPRAZolam 0.5 MG tablet Commonly known as:  XANAX TAKE 1 TABLET TWICE DAILY AS NEEDED FOR ANXIETY.   aspirin 81 MG tablet Take 81 mg by mouth daily.   furosemide 40 MG tablet Commonly known as:  LASIX Take 1.5 tablets (60 mg total) by mouth daily. Along with metolazone and potassium supplement   levothyroxine 125 MCG tablet Commonly known as:  SYNTHROID, LEVOTHROID Take 1 tablet (125 mcg total) by mouth daily.   loratadine 10 MG tablet Commonly known as:  CLARITIN Take 10 mg by mouth at bedtime.   losartan 100 MG tablet Commonly known as:  COZAAR TAKE 1 TABLET ONCE DAILY. HOLD FOR SYSTOLIC BLOOD PRESSURE LESS THAN 110.   metolazone 2.5 MG tablet Commonly known as:  ZAROXOLYN Take 1 tablet (2.5 mg total) by mouth daily.   omeprazole 20 MG capsule Commonly known as:  PRILOSEC TAKE (1) CAPSULE DAILY.   ondansetron 4 MG tablet Commonly known as:  ZOFRAN TAKE (1) TABLET BY MOUTH EVERY SIX HOURS AS NEEDED FOR NAUSEA.   potassium chloride SA 20 MEQ tablet Commonly known as:  K-DUR,KLOR-CON Take 60 mEq by mouth 2 (two) times daily.   sertraline 50 MG tablet Commonly known as:  ZOLOFT Take 1 tablet (50 mg total) by mouth daily. With 25 mg daily for total of 75 mg daily       Review of  Systems:  Review of Systems  Constitutional: Negative for activity change, appetite change, chills, diaphoresis, fatigue, fever and unexpected weight change.  HENT: Positive for hearing loss. Negative for congestion and voice change.   Respiratory: Positive for cough. Negative for shortness of breath and wheezing.   Cardiovascular: Positive for leg swelling. Negative for chest pain and palpitations.  Gastrointestinal: Negative for abdominal distention, abdominal pain, constipation, diarrhea, nausea and vomiting.  Genitourinary: Negative for difficulty urinating, dysuria and urgency.       Urinary leakage.   Musculoskeletal: Positive for arthralgias and gait problem. Negative for joint swelling.  Skin: Negative for  color change, pallor, rash and wound.  Neurological: Negative for dizziness, facial asymmetry, speech difficulty, weakness and headaches.  Psychiatric/Behavioral: Negative for agitation, behavioral problems, confusion, hallucinations and sleep disturbance. The patient is not nervous/anxious.     Health Maintenance  Topic Date Due  . FOOT EXAM  09/08/1940  . OPHTHALMOLOGY EXAM  09/08/1940  . DEXA SCAN  09/07/1997  . HEMOGLOBIN A1C  09/03/2018  . TETANUS/TDAP  10/05/2019  . INFLUENZA VACCINE  Completed  . PNA vac Low Risk Adult  Completed    Physical Exam: Vitals:   03/28/18 1512  BP: 122/78  Pulse: 87  Resp: 20  Temp: (!) 97.5 F (36.4 C)  TempSrc: Oral  SpO2: 95%  Weight: 297 lb 3.2 oz (134.8 kg)  Height: $Remove'5\' 1"'XNDzDSs$  (1.549 m)   Body mass index is 56.16 kg/m. Physical Exam  Constitutional: She is oriented to person, place, and time. She appears well-developed and well-nourished.  HENT:  Head: Normocephalic and atraumatic.  Right Ear: External ear normal.  Eyes: Pupils are equal, round, and reactive to light. EOM are normal.  Neck: Normal range of motion. Neck supple. No JVD present. No thyromegaly present.  Cardiovascular: Normal rate and regular rhythm.  No  murmur heard. Pulmonary/Chest: Effort normal. She has no wheezes. She has rales.  Bibasilar.   Abdominal: Soft. She exhibits no distension. There is no tenderness. There is no rebound and no guarding.  Musculoskeletal: She exhibits edema.  W/c for mobility.   Neurological: She is alert and oriented to person, place, and time. No cranial nerve deficit. She exhibits normal muscle tone. Coordination normal.  Skin: Skin is warm and dry.  BLE lymphedema, chronic stasis changes.   Psychiatric: She has a normal mood and affect. Her behavior is normal. Judgment and thought content normal.    Labs reviewed: Basic Metabolic Panel: Recent Labs    07/17/17 0825  10/03/17 0901 12/11/17 0000  01/01/18 0715 01/10/18 0000 03/05/18 0745  03/17/18 0529 03/18/18 0514 03/25/18 0000  NA  --    < >  --  141   < > 142 141 140   < > 143 142 137  K  --    < >  --  3.2*   < > 4.0 3.7 3.3*   < > 4.1 4.3 3.8  CL  --    < >  --  98   < > 100 99 100   < > 95* 92* 100  CO2  --    < >  --  33*   < > 36* 33* 31   < > 38* 40* 24  GLUCOSE  --    < >  --  109*   < > 115* 86 112*   < > 112* 112* 98  BUN  --    < >  --  22   < > $R'17 22 20   'QD$ < > 24* 24* 30*  CREATININE  --    < >  --  0.88   < > 0.86 0.89* 0.95*   < > 0.99 1.25* 1.01*  CALCIUM  --    < >  --  8.1*   < > 8.0* 8.1* 8.1*   < > 8.2* 8.3* 8.4*  MG 2.5*  --   --   --   --  1.8 1.7  --   --   --   --   --   TSH  --    < > 23.59*  8.77*  --   --   --  6.51*  --   --   --   --    < > = values in this interval not displayed.   Liver Function Tests: Recent Labs    07/17/17 0152  03/05/18 0745 03/14/18 1701 03/15/18 0540  AST 18   < > 14  14 21  14*  ALT 13*   < > 9  9 15 13   ALKPHOS 82  --   --  90 74  BILITOT 0.6   < > 0.5  0.5 0.6 0.4  PROT 6.1*   < > 6.1  6.1 6.8 6.1*  ALBUMIN 2.9*  --   --  3.4* 3.0*   < > = values in this interval not displayed.   Recent Labs    07/16/17 1109  LIPASE 22   No results for input(s): AMMONIA in the last  8760 hours. CBC: Recent Labs    07/16/17 1109  07/26/17 0745 03/05/18 0745 03/14/18 1658 03/15/18 0540  WBC 6.9   < > 7.2 6.5 7.8 6.8  NEUTROABS 5.3  --  5,162  --   --  4.7  HGB 12.8   < > 13.2 12.8 13.1 12.3  HCT 39.3   < > 38.5 38.4 40.7 39.0  MCV 88.5   < > 84.6 87.1 92.5 92.6  PLT 165   < > 159 150 151 142*   < > = values in this interval not displayed.   Lipid Panel: Recent Labs    06/26/17 0655 03/05/18 0745  CHOL 171 153  HDL 45* 45*  LDLCALC 104* 86  TRIG 128 127  CHOLHDL 3.8 3.4   Lab Results  Component Value Date   HGBA1C 6.6 (H) 03/05/2018    Procedures since last visit: Dg Chest 2 View  Result Date: 03/14/2018 CLINICAL DATA:  Weakness and dyspnea EXAM: CHEST - 2 VIEW COMPARISON:  07/16/2017 CT and CXR 01/12/2017 FINDINGS: Cardiomegaly with mild pulmonary vascular redistribution consistent with CHF. No pulmonary consolidation, effusion or pneumothorax. Tortuous atherosclerotic aorta. Surgical clips are seen in the right axilla. No acute osseous abnormality is noted. Degenerative changes are present along the dorsal spine. IMPRESSION: Cardiomegaly with mild CHF. Aortic atherosclerosis. Electronically Signed   By: Ashley Royalty M.D.   On: 03/14/2018 17:53    Assessment/Plan Acute on chronic diastolic (congestive) heart failure ([place]) 03/15/18 Echocardiogram: EF 65-70%, clinically compensated, continue Furosemide 40mg  qd, Metolazone 2.5mg  qd, 1500cc/day fluid restriction , f/u Cardiology  Essential hypertension Controlled, continue Losartan 100mg  qd.   Acid reflux Stable, continue Omeprazole 20mg  qd.   Hypothyroidism (acquired) Controlled, continue Levothyroxine 155mcg qd, pending TSH prior to the next appointment 06/06/18  Diabetes mellitus type 2 in obese [facility]) Diet controlled, pending Hgb a1c prior to the next appointment 06/06/18  Hypokalemia Normalized, continue K 25meq bid, update CMP prior to the next appointment 05/30/18.   Depression with  anxiety Stable, continue Sertraline 50mg  qd, Alprazolam 0.5mg  bid prn    Labs/tests ordered:  BMP done 03/25/18. CMP TSH, Hgb a1c,  prior to the appointment.   ": Next appt:  06/06/2018  Time spend 25 minutes.

## 2018-04-01 ENCOUNTER — Ambulatory Visit: Payer: Self-pay | Admitting: Pulmonary Disease

## 2018-04-01 ENCOUNTER — Other Ambulatory Visit: Payer: Self-pay | Admitting: Nurse Practitioner

## 2018-04-01 NOTE — Assessment & Plan Note (Signed)
Diet controlled, pending Hgb a1c prior to the next appointment 06/06/18

## 2018-04-01 NOTE — Assessment & Plan Note (Signed)
Controlled, continue Losartan $RemoveBeforeDEI'100mg'ItgRhcpeHybkuIjk$  qd.

## 2018-04-01 NOTE — Assessment & Plan Note (Signed)
Controlled, continue Levothyroxine 112mcg qd, pending TSH prior to the next appointment 06/06/18

## 2018-04-01 NOTE — Assessment & Plan Note (Signed)
Stable, continue Sertraline 50mg  qd, Alprazolam 0.5mg  bid prn

## 2018-04-01 NOTE — Assessment & Plan Note (Signed)
Stable, continue Omeprazole 20mg qd.  

## 2018-04-01 NOTE — Assessment & Plan Note (Signed)
Normalized, continue K 58meq bid, update CMP prior to the next appointment 05/30/18.

## 2018-04-09 ENCOUNTER — Other Ambulatory Visit: Payer: Self-pay | Admitting: Nurse Practitioner

## 2018-04-11 DIAGNOSIS — I5089 Other heart failure: Secondary | ICD-10-CM | POA: Diagnosis not present

## 2018-04-11 DIAGNOSIS — M6281 Muscle weakness (generalized): Secondary | ICD-10-CM | POA: Diagnosis not present

## 2018-04-11 DIAGNOSIS — R2681 Unsteadiness on feet: Secondary | ICD-10-CM | POA: Diagnosis not present

## 2018-04-11 DIAGNOSIS — M545 Low back pain: Secondary | ICD-10-CM | POA: Diagnosis not present

## 2018-04-11 DIAGNOSIS — M25552 Pain in left hip: Secondary | ICD-10-CM | POA: Diagnosis not present

## 2018-04-15 DIAGNOSIS — M6281 Muscle weakness (generalized): Secondary | ICD-10-CM | POA: Diagnosis not present

## 2018-04-15 DIAGNOSIS — M545 Low back pain: Secondary | ICD-10-CM | POA: Diagnosis not present

## 2018-04-15 DIAGNOSIS — R2681 Unsteadiness on feet: Secondary | ICD-10-CM | POA: Diagnosis not present

## 2018-04-15 DIAGNOSIS — M25552 Pain in left hip: Secondary | ICD-10-CM | POA: Diagnosis not present

## 2018-04-15 DIAGNOSIS — I5089 Other heart failure: Secondary | ICD-10-CM | POA: Diagnosis not present

## 2018-04-17 DIAGNOSIS — R2681 Unsteadiness on feet: Secondary | ICD-10-CM | POA: Diagnosis not present

## 2018-04-17 DIAGNOSIS — F418 Other specified anxiety disorders: Secondary | ICD-10-CM | POA: Diagnosis not present

## 2018-04-17 DIAGNOSIS — M25552 Pain in left hip: Secondary | ICD-10-CM | POA: Diagnosis not present

## 2018-04-17 DIAGNOSIS — E1122 Type 2 diabetes mellitus with diabetic chronic kidney disease: Secondary | ICD-10-CM | POA: Diagnosis not present

## 2018-04-17 DIAGNOSIS — M6281 Muscle weakness (generalized): Secondary | ICD-10-CM | POA: Diagnosis not present

## 2018-04-17 DIAGNOSIS — N184 Chronic kidney disease, stage 4 (severe): Secondary | ICD-10-CM | POA: Diagnosis not present

## 2018-04-17 DIAGNOSIS — E662 Morbid (severe) obesity with alveolar hypoventilation: Secondary | ICD-10-CM | POA: Diagnosis not present

## 2018-04-17 DIAGNOSIS — M545 Low back pain: Secondary | ICD-10-CM | POA: Diagnosis not present

## 2018-04-17 DIAGNOSIS — I5089 Other heart failure: Secondary | ICD-10-CM | POA: Diagnosis not present

## 2018-04-17 DIAGNOSIS — I130 Hypertensive heart and chronic kidney disease with heart failure and stage 1 through stage 4 chronic kidney disease, or unspecified chronic kidney disease: Secondary | ICD-10-CM | POA: Diagnosis not present

## 2018-04-17 DIAGNOSIS — I5033 Acute on chronic diastolic (congestive) heart failure: Secondary | ICD-10-CM | POA: Diagnosis not present

## 2018-04-19 ENCOUNTER — Ambulatory Visit: Payer: Self-pay | Admitting: Pulmonary Disease

## 2018-04-19 DIAGNOSIS — M6281 Muscle weakness (generalized): Secondary | ICD-10-CM | POA: Diagnosis not present

## 2018-04-19 DIAGNOSIS — N393 Stress incontinence (female) (male): Secondary | ICD-10-CM | POA: Diagnosis not present

## 2018-04-19 DIAGNOSIS — M542 Cervicalgia: Secondary | ICD-10-CM | POA: Diagnosis not present

## 2018-04-19 DIAGNOSIS — R2681 Unsteadiness on feet: Secondary | ICD-10-CM | POA: Diagnosis not present

## 2018-04-19 DIAGNOSIS — M545 Low back pain: Secondary | ICD-10-CM | POA: Diagnosis not present

## 2018-04-19 DIAGNOSIS — I5089 Other heart failure: Secondary | ICD-10-CM | POA: Diagnosis not present

## 2018-04-22 DIAGNOSIS — N393 Stress incontinence (female) (male): Secondary | ICD-10-CM | POA: Diagnosis not present

## 2018-04-22 DIAGNOSIS — R2681 Unsteadiness on feet: Secondary | ICD-10-CM | POA: Diagnosis not present

## 2018-04-22 DIAGNOSIS — M545 Low back pain: Secondary | ICD-10-CM | POA: Diagnosis not present

## 2018-04-22 DIAGNOSIS — I5089 Other heart failure: Secondary | ICD-10-CM | POA: Diagnosis not present

## 2018-04-22 DIAGNOSIS — M6281 Muscle weakness (generalized): Secondary | ICD-10-CM | POA: Diagnosis not present

## 2018-04-22 DIAGNOSIS — M542 Cervicalgia: Secondary | ICD-10-CM | POA: Diagnosis not present

## 2018-04-23 ENCOUNTER — Emergency Department (HOSPITAL_COMMUNITY): Payer: Medicare Other

## 2018-04-23 ENCOUNTER — Observation Stay (HOSPITAL_COMMUNITY)
Admission: EM | Admit: 2018-04-23 | Discharge: 2018-04-25 | Disposition: A | Discharge status: Home / Self Care | Payer: Medicare Other | Attending: Internal Medicine | Admitting: Internal Medicine

## 2018-04-23 ENCOUNTER — Encounter (HOSPITAL_COMMUNITY): Payer: Self-pay | Admitting: Emergency Medicine

## 2018-04-23 ENCOUNTER — Other Ambulatory Visit: Payer: Self-pay

## 2018-04-23 DIAGNOSIS — K219 Gastro-esophageal reflux disease without esophagitis: Secondary | ICD-10-CM

## 2018-04-23 DIAGNOSIS — E890 Postprocedural hypothyroidism: Secondary | ICD-10-CM | POA: Insufficient documentation

## 2018-04-23 DIAGNOSIS — R0789 Other chest pain: Secondary | ICD-10-CM | POA: Diagnosis not present

## 2018-04-23 DIAGNOSIS — R079 Chest pain, unspecified: Secondary | ICD-10-CM | POA: Diagnosis present

## 2018-04-23 DIAGNOSIS — R0602 Shortness of breath: Secondary | ICD-10-CM | POA: Diagnosis not present

## 2018-04-23 DIAGNOSIS — K5909 Other constipation: Secondary | ICD-10-CM | POA: Diagnosis not present

## 2018-04-23 DIAGNOSIS — N183 Chronic kidney disease, stage 3 unspecified: Secondary | ICD-10-CM | POA: Diagnosis present

## 2018-04-23 DIAGNOSIS — Z66 Do not resuscitate: Secondary | ICD-10-CM | POA: Insufficient documentation

## 2018-04-23 DIAGNOSIS — Z9981 Dependence on supplemental oxygen: Secondary | ICD-10-CM | POA: Insufficient documentation

## 2018-04-23 DIAGNOSIS — Z96653 Presence of artificial knee joint, bilateral: Secondary | ICD-10-CM | POA: Diagnosis not present

## 2018-04-23 DIAGNOSIS — I5032 Chronic diastolic (congestive) heart failure: Secondary | ICD-10-CM | POA: Diagnosis not present

## 2018-04-23 DIAGNOSIS — Z6841 Body Mass Index (BMI) 40.0 and over, adult: Secondary | ICD-10-CM | POA: Diagnosis not present

## 2018-04-23 DIAGNOSIS — F329 Major depressive disorder, single episode, unspecified: Secondary | ICD-10-CM | POA: Insufficient documentation

## 2018-04-23 DIAGNOSIS — I10 Essential (primary) hypertension: Secondary | ICD-10-CM | POA: Diagnosis present

## 2018-04-23 DIAGNOSIS — E662 Morbid (severe) obesity with alveolar hypoventilation: Secondary | ICD-10-CM | POA: Diagnosis not present

## 2018-04-23 DIAGNOSIS — E1122 Type 2 diabetes mellitus with diabetic chronic kidney disease: Secondary | ICD-10-CM | POA: Insufficient documentation

## 2018-04-23 DIAGNOSIS — Z7982 Long term (current) use of aspirin: Secondary | ICD-10-CM | POA: Diagnosis not present

## 2018-04-23 DIAGNOSIS — E039 Hypothyroidism, unspecified: Secondary | ICD-10-CM | POA: Diagnosis not present

## 2018-04-23 DIAGNOSIS — E876 Hypokalemia: Secondary | ICD-10-CM | POA: Diagnosis not present

## 2018-04-23 DIAGNOSIS — I130 Hypertensive heart and chronic kidney disease with heart failure and stage 1 through stage 4 chronic kidney disease, or unspecified chronic kidney disease: Secondary | ICD-10-CM | POA: Insufficient documentation

## 2018-04-23 DIAGNOSIS — J9611 Chronic respiratory failure with hypoxia: Secondary | ICD-10-CM | POA: Diagnosis present

## 2018-04-23 DIAGNOSIS — R072 Precordial pain: Secondary | ICD-10-CM | POA: Diagnosis not present

## 2018-04-23 DIAGNOSIS — Z79899 Other long term (current) drug therapy: Secondary | ICD-10-CM | POA: Insufficient documentation

## 2018-04-23 DIAGNOSIS — L8990 Pressure ulcer of unspecified site, unspecified stage: Secondary | ICD-10-CM

## 2018-04-23 DIAGNOSIS — J9811 Atelectasis: Secondary | ICD-10-CM | POA: Diagnosis not present

## 2018-04-23 DIAGNOSIS — E669 Obesity, unspecified: Secondary | ICD-10-CM | POA: Diagnosis present

## 2018-04-23 DIAGNOSIS — Z853 Personal history of malignant neoplasm of breast: Secondary | ICD-10-CM | POA: Insufficient documentation

## 2018-04-23 DIAGNOSIS — F419 Anxiety disorder, unspecified: Secondary | ICD-10-CM | POA: Diagnosis not present

## 2018-04-23 DIAGNOSIS — J449 Chronic obstructive pulmonary disease, unspecified: Secondary | ICD-10-CM | POA: Insufficient documentation

## 2018-04-23 DIAGNOSIS — L8991 Pressure ulcer of unspecified site, stage 1: Secondary | ICD-10-CM | POA: Insufficient documentation

## 2018-04-23 DIAGNOSIS — E785 Hyperlipidemia, unspecified: Secondary | ICD-10-CM | POA: Insufficient documentation

## 2018-04-23 DIAGNOSIS — Z79890 Hormone replacement therapy (postmenopausal): Secondary | ICD-10-CM | POA: Diagnosis not present

## 2018-04-23 DIAGNOSIS — Z87891 Personal history of nicotine dependence: Secondary | ICD-10-CM | POA: Insufficient documentation

## 2018-04-23 DIAGNOSIS — J80 Acute respiratory distress syndrome: Secondary | ICD-10-CM | POA: Diagnosis not present

## 2018-04-23 DIAGNOSIS — E1169 Type 2 diabetes mellitus with other specified complication: Secondary | ICD-10-CM | POA: Diagnosis present

## 2018-04-23 DIAGNOSIS — R0902 Hypoxemia: Secondary | ICD-10-CM | POA: Diagnosis not present

## 2018-04-23 LAB — CBC WITH DIFFERENTIAL/PLATELET
Abs Immature Granulocytes: 0.02 10*3/uL (ref 0.00–0.07)
Basophils Absolute: 0.1 10*3/uL (ref 0.0–0.1)
Basophils Relative: 1 %
EOS ABS: 0.2 10*3/uL (ref 0.0–0.5)
EOS PCT: 2 %
HCT: 42.3 % (ref 36.0–46.0)
HEMOGLOBIN: 12.6 g/dL (ref 12.0–15.0)
Immature Granulocytes: 0 %
LYMPHS PCT: 15 %
Lymphs Abs: 1 10*3/uL (ref 0.7–4.0)
MCH: 28.4 pg (ref 26.0–34.0)
MCHC: 29.8 g/dL — AB (ref 30.0–36.0)
MCV: 95.5 fL (ref 80.0–100.0)
MONO ABS: 0.4 10*3/uL (ref 0.1–1.0)
Monocytes Relative: 6 %
Neutro Abs: 5.4 10*3/uL (ref 1.7–7.7)
Neutrophils Relative %: 76 %
Platelets: 141 10*3/uL — ABNORMAL LOW (ref 150–400)
RBC: 4.43 MIL/uL (ref 3.87–5.11)
RDW: 15.3 % (ref 11.5–15.5)
WBC: 7.1 10*3/uL (ref 4.0–10.5)
nRBC: 0 % (ref 0.0–0.2)

## 2018-04-23 LAB — TROPONIN I

## 2018-04-23 LAB — COMPREHENSIVE METABOLIC PANEL
ALT: 12 U/L (ref 0–44)
AST: 20 U/L (ref 15–41)
Albumin: 3 g/dL — ABNORMAL LOW (ref 3.5–5.0)
Alkaline Phosphatase: 84 U/L (ref 38–126)
Anion gap: 9 (ref 5–15)
BUN: 19 mg/dL (ref 8–23)
CALCIUM: 8.2 mg/dL — AB (ref 8.9–10.3)
CHLORIDE: 102 mmol/L (ref 98–111)
CO2: 30 mmol/L (ref 22–32)
CREATININE: 1.14 mg/dL — AB (ref 0.44–1.00)
GFR calc Af Amer: 49 mL/min — ABNORMAL LOW (ref >60)
GFR, EST NON AFRICAN AMERICAN: 43 mL/min — AB (ref >60)
Glucose, Bld: 161 mg/dL — ABNORMAL HIGH (ref 70–99)
Potassium: 4.1 mmol/L (ref 3.5–5.1)
SODIUM: 141 mmol/L (ref 135–145)
Total Bilirubin: 0.7 mg/dL (ref 0.3–1.2)
Total Protein: 6.6 g/dL (ref 6.5–8.1)

## 2018-04-23 LAB — BRAIN NATRIURETIC PEPTIDE: B Natriuretic Peptide: 97.3 pg/mL (ref 0.0–100.0)

## 2018-04-23 LAB — GLUCOSE, CAPILLARY: GLUCOSE-CAPILLARY: 93 mg/dL (ref 70–99)

## 2018-04-23 MED ORDER — SERTRALINE HCL 50 MG PO TABS
75.0000 mg | ORAL_TABLET | Freq: Every day | ORAL | Status: DC
  Administered 2018-04-24 – 2018-04-25 (×2): 75 mg via ORAL
  Filled 2018-04-23 (×2): qty 1

## 2018-04-23 MED ORDER — ALPRAZOLAM 0.25 MG PO TABS
0.5000 mg | ORAL_TABLET | Freq: Two times a day (BID) | ORAL | Status: DC | PRN
  Administered 2018-04-24 (×2): 0.5 mg via ORAL
  Filled 2018-04-23 (×3): qty 2

## 2018-04-23 MED ORDER — IOPAMIDOL (ISOVUE-370) INJECTION 76%
100.0000 mL | Freq: Once | INTRAVENOUS | Status: AC | PRN
  Administered 2018-04-23: 100 mL via INTRAVENOUS

## 2018-04-23 MED ORDER — FUROSEMIDE 20 MG PO TABS
60.0000 mg | ORAL_TABLET | Freq: Every day | ORAL | Status: DC
  Administered 2018-04-24: 60 mg via ORAL
  Filled 2018-04-23: qty 3

## 2018-04-23 MED ORDER — ONDANSETRON HCL 4 MG/2ML IJ SOLN
4.0000 mg | Freq: Once | INTRAMUSCULAR | Status: AC
  Administered 2018-04-23: 4 mg via INTRAVENOUS
  Filled 2018-04-23: qty 2

## 2018-04-23 MED ORDER — INSULIN ASPART 100 UNIT/ML ~~LOC~~ SOLN
0.0000 [IU] | Freq: Three times a day (TID) | SUBCUTANEOUS | Status: DC
  Administered 2018-04-24 (×2): 1 [IU] via SUBCUTANEOUS
  Filled 2018-04-23 (×2): qty 1

## 2018-04-23 MED ORDER — ASPIRIN 300 MG RE SUPP: 300.0000 mg | RECTAL | Status: AC

## 2018-04-23 MED ORDER — ASPIRIN EC 81 MG PO TBEC
81.0000 mg | DELAYED_RELEASE_TABLET | Freq: Every day | ORAL | Status: DC
  Administered 2018-04-24 – 2018-04-25 (×2): 81 mg via ORAL
  Filled 2018-04-23 (×2): qty 1

## 2018-04-23 MED ORDER — MORPHINE SULFATE (PF) 2 MG/ML IV SOLN: 1.0000 mg | INTRAVENOUS | Status: DC | PRN

## 2018-04-23 MED ORDER — LEVOTHYROXINE SODIUM 125 MCG PO TABS
125.0000 ug | ORAL_TABLET | Freq: Every day | ORAL | Status: DC
  Administered 2018-04-24 – 2018-04-25 (×2): 125 ug via ORAL
  Filled 2018-04-23 (×2): qty 1

## 2018-04-23 MED ORDER — ACETAMINOPHEN 325 MG PO TABS: 650.0000 mg | ORAL_TABLET | ORAL | Status: DC | PRN

## 2018-04-23 MED ORDER — PANTOPRAZOLE SODIUM 40 MG PO TBEC
40.0000 mg | DELAYED_RELEASE_TABLET | Freq: Every day | ORAL | Status: DC
  Administered 2018-04-24: 40 mg via ORAL
  Filled 2018-04-23: qty 1

## 2018-04-23 MED ORDER — METOLAZONE 2.5 MG PO TABS
2.5000 mg | ORAL_TABLET | Freq: Every day | ORAL | Status: DC
  Administered 2018-04-24 – 2018-04-25 (×2): 2.5 mg via ORAL
  Filled 2018-04-23 (×2): qty 1

## 2018-04-23 MED ORDER — ONDANSETRON HCL 4 MG/2ML IJ SOLN: 4.0000 mg | Freq: Four times a day (QID) | INTRAMUSCULAR | Status: DC | PRN

## 2018-04-23 MED ORDER — HEPARIN SODIUM (PORCINE) 5000 UNIT/ML IJ SOLN
5000.0000 [IU] | Freq: Three times a day (TID) | INTRAMUSCULAR | Status: DC
  Administered 2018-04-23 – 2018-04-25 (×6): 5000 [IU] via SUBCUTANEOUS
  Filled 2018-04-23 (×6): qty 1

## 2018-04-23 MED ORDER — LOSARTAN POTASSIUM 50 MG PO TABS
100.0000 mg | ORAL_TABLET | Freq: Every day | ORAL | Status: DC
  Administered 2018-04-24 – 2018-04-25 (×2): 100 mg via ORAL
  Filled 2018-04-23 (×2): qty 2

## 2018-04-23 MED ORDER — INSULIN ASPART 100 UNIT/ML ~~LOC~~ SOLN
0.0000 [IU] | Freq: Every day | SUBCUTANEOUS | Status: DC
  Filled 2018-04-23: qty 1

## 2018-04-23 MED ORDER — ALPRAZOLAM 0.25 MG PO TABS: 0.5000 mg | ORAL_TABLET | Freq: Two times a day (BID) | ORAL | Status: DC | PRN

## 2018-04-23 MED ORDER — NITROGLYCERIN 0.4 MG SL SUBL: 0.4000 mg | SUBLINGUAL_TABLET | SUBLINGUAL | Status: DC | PRN

## 2018-04-23 MED ORDER — ASPIRIN 81 MG PO CHEW
324.0000 mg | CHEWABLE_TABLET | ORAL | Status: AC
  Administered 2018-04-23: 243 mg via ORAL
  Filled 2018-04-23: qty 4

## 2018-04-23 MED ORDER — IOPAMIDOL (ISOVUE-370) INJECTION 76%
INTRAVENOUS | Status: AC
  Filled 2018-04-23: qty 100

## 2018-04-23 NOTE — ED Triage Notes (Addendum)
Per EMS pt is from [facility] in [place].  She lives in [facility].  She developed SOB 2 hours ago with some central CP.  Currently denies pain and SOB.  She is on 3L of O2 all the time.  AOx4 NAD noted at this time. PT is a DNR

## 2018-04-23 NOTE — Progress Notes (Signed)
Patient refused CPAP HS. Patient states she will be fine with the 2L [place]

## 2018-04-23 NOTE — H&P (Signed)
History and Physical    Tamara Morales VEH:[id number] DOB: [birth year] DOA: 04/23/2018  PCP: Blanchie Serve, MD   Patient coming from: [facility]   Chief Complaint: Chest pain, SOB   HPI: Tamara Morales is a 84 y.o. female with medical history significant for OSA on CPAP, chronic hypoxic respiratory failure, chronic diastolic CHF, CKD 3, anxiety disorder, and hypothyroidism, now presenting to the emergency department for evaluation of chest discomfort and dyspnea.  Patient reports waking this morning with pain in her chest that she describes as a "tightness" and "burning."  Discomfort is localized to the central chest and associated with mild dyspnea and mild nausea without vomiting.  She was at rest when symptoms developed and have slowly eased off without any specific intervention.  She reports similar symptoms occasionally over the past couple years, but is concerned that it is becoming more frequent and more intense.  She denies any fevers, chills, headache, abdominal pain, or dysuria.  Reports that her chronic lower extremity swelling has been stable.  She reports undergoing cardiac catheterization approximately 20 years ago and was told that there was no abnormalities found.  She had an echocardiogram in September with preserved EF and normal wall motion.  ED Course: Upon arrival to the ED, patient is found to be afebrile, saturating well on her usual supplemental oxygen, and with vitals otherwise stable.  EKG is limited by artifact but notable for an incomplete RBBB.  Chest x-ray is negative for acute cardiopulmonary disease.  CTA chest is negative for PE or aortic dissection and demonstrates clear lungs.  Chemistry panel is notable for serum creatinine 1.14 and CBC features a slight thrombocytopenia.  Troponin is undetectable and BNP normal.  Patient remains hemodynamically stable and will be observed on the telemetry unit for ongoing evaluation and management of chest discomfort.  Review of  Systems:  All other systems reviewed and apart from HPI, are negative.  Past Medical History:  Diagnosis Date  . ARTHRITIS, KNEES, BILATERAL   . Breast CA ([place]) 2000   s/p R lumpectomy and XRT  . DEPRESSION   . DJD (degenerative joint disease) of knee   . GERD   . HYPERLIPIDEMIA   . HYPERTENSION   . HYPOTHYROIDISM    postsurgical  . Hypoxia   . MIGRAINE HEADACHE   . Morbid obesity ([place])   . OSA (obstructive sleep apnea) 01/17/2011 dx  . Oxygen dependent   . Urge incontinence   . UTI (lower urinary tract infection) 12/2014    Past Surgical History:  Procedure Laterality Date  . ABDOMINAL HYSTERECTOMY    . ADENOIDECTOMY    . BREAST BIOPSY  2000  . BREAST LUMPECTOMY  2000  . CATARACT EXTRACTION    . CHOLECYSTECTOMY  1995  . CYSTOSCOPY/URETEROSCOPY/HOLMIUM LASER Right 06/28/2015   Procedure: CYSTOSCOPY RIGHT RETROGRAD RIGHT URETEROSCOPY/HOLMIUM LASER WITH RIGHT STENT PLACEMENT;  Surgeon: Alexis Frock, MD;  Location: [facility];  Service: Urology;  Laterality: Right;  . PARATHYROIDECTOMY     2-3 removed  . REPLACEMENT TOTAL KNEE BILATERAL  1999, 2005  . Right leg femur repaired    . THYROIDECTOMY    . TONSILLECTOMY    . TUBAL LIGATION       reports that she quit smoking about 43 years ago. Her smoking use included cigarettes. She has a 0.50 pack-year smoking history. She has never used smokeless tobacco. She reports that she does not drink alcohol or use drugs.  Allergies  Allergen Reactions  . Azithromycin Itching  .  Beta Adrenergic Blockers Other (See Comments)    Depression   . Ciprofloxacin Other (See Comments)    Edgy and very jumpy    . Codeine Nausea And Vomiting  . Darvon [Propoxyphene] Nausea And Vomiting  . Epinephrine Other (See Comments)    Rapid pulse, sweats  . Klonopin [Clonazepam] Other (See Comments)    Makes her feel very suicidal   . Oxycodone Other (See Comments)    Patient felt like it altered her mental status "Crazy" . Hallucinations later as  well.  [name] Paxil [Paroxetine Hydrochloride] Other (See Comments)    Severe depression   . Prednisone Other (See Comments)    "makes me feel really bad"  . Propoxyphene Hcl Nausea And Vomiting  . Tape Itching  . Torsemide Other (See Comments)    Patient stated that she doesn't recall the reaction  . Tramadol Other (See Comments)    Feels "jittery"  . Vicodin [Hydrocodone-Acetaminophen] Other (See Comments)    Hallucinations  . Meloxicam Diarrhea  . Vancomycin Rash    Localized rash related to infusion rate    Family History  Problem Relation Age of Onset  . Emphysema Sister   . Coronary artery disease Neg Hx      Prior to Admission medications   Medication Sig Start Date End Date Taking? Authorizing Provider  acetaminophen (TYLENOL) 325 MG tablet Take 650 mg by mouth every 8 (eight) hours as needed for mild pain or moderate pain.    Yes [provider]  ALPRAZolam (XANAX) 0.5 MG tablet TAKE 1 TABLET TWICE DAILY AS NEEDED FOR ANXIETY. Patient taking differently: Take 0.5 mg by mouth 2 (two) times daily as needed for anxiety.  03/27/18  Yes Mast, Man X, NP  aspirin 81 MG tablet Take 81 mg by mouth daily.    Yes [provider]  furosemide (LASIX) 40 MG tablet Take 1.5 tablets (60 mg total) by mouth daily. Along with metolazone and potassium supplement 11/20/17  Yes Pandey, Mahima, MD  levothyroxine (SYNTHROID, LEVOTHROID) 125 MCG tablet Take 1 tablet (125 mcg total) by mouth daily. 10/11/17  Yes Blanchie Serve, MD  loratadine (CLARITIN) 10 MG tablet Take 10 mg by mouth daily as needed for allergies.    Yes [provider]  losartan (COZAAR) 50 MG tablet TAKE 2 TABLETS ONCE A DAY. HOLD FOR SYSTOLIC BLOOD PRESSURE LESS [id number]. Patient taking differently: Take 100 mg by mouth See admin instructions. Take 100 mg by mouth once a day and HOLD IF SYSTOLIC READING IS <812 75/17/00  Yes Mast, Man X, NP  metolazone (ZAROXOLYN) 2.5 MG tablet Take 1 tablet (2.5 mg total) by  mouth daily. 03/27/17  Yes Blanchie Serve, MD  omeprazole (PRILOSEC) 20 MG capsule TAKE (1) CAPSULE DAILY. Patient taking differently: Take 20 mg by mouth daily.  11/15/17  Yes Mast, Man X, NP  ondansetron (ZOFRAN) 4 MG tablet TAKE (1) TABLET BY MOUTH EVERY SIX HOURS AS NEEDED FOR NAUSEA. Patient taking differently: Take 4 mg by mouth every 6 (six) hours as needed for nausea.  03/26/18  Yes Mast, Man X, NP  potassium chloride SA (K-DUR,KLOR-CON) 20 MEQ tablet Take 60 mEq by mouth 2 (two) times daily.   Yes [provider]  psyllium (REGULOID) 0.52 g capsule Take 1.04 g by mouth daily as needed (for mild constipation).    Yes [provider]  sertraline (ZOLOFT) 50 MG tablet Take 1 tablet (50 mg total) by mouth daily. With 25 mg daily for total of  75 mg daily Patient taking differently: Take 50 mg by mouth daily.  12/18/17  Yes Blanchie Serve, MD    Physical Exam: Vitals:   04/23/18 1630 04/23/18 1900 04/23/18 1930 04/23/18 1940  BP: (!) 98/56   110/67  Pulse: 83 70 83 73  Resp: $Remo'17 17 20 17  'sMSOU$ Temp:      TempSrc:      SpO2: 93% 95% 96% 93%  Weight:      Height:        Constitutional: NAD, calm  Eyes: PERTLA, lids and conjunctivae normal ENMT: Mucous membranes are moist. Posterior pharynx clear of any exudate or lesions.   Neck: normal, supple, no masses, no thyromegaly Respiratory: clear to auscultation bilaterally, no wheezing, no crackles. Normal respiratory effort.    Cardiovascular: S1 & S2 heard, regular rate and rhythm. Trace pretibial pitting edema bilaterally. Abdomen: No distension, no tenderness, soft. Bowel sounds normal.  Musculoskeletal: no clubbing / cyanosis. No joint deformity upper and lower extremities.  Skin: no significant rashes, lesions, ulcers. Warm, dry, well-perfused. Neurologic: No facial asymmetry. Sensation intact. Moving all extremities.  Psychiatric:  Alert and oriented x 3. Pleasant and cooperative.    Labs on Admission: I have personally  reviewed following labs and imaging studies  CBC: Recent Labs  Lab 04/23/18 1519  WBC 7.1  NEUTROABS 5.4  HGB 12.6  HCT 42.3  MCV 95.5  PLT 814*   Basic Metabolic Panel: Recent Labs  Lab 04/23/18 1519  NA 141  K 4.1  CL 102  CO2 30  GLUCOSE 161*  BUN 19  CREATININE 1.14*  CALCIUM 8.2*   GFR: Estimated Creatinine Clearance: 48.4 mL/min (A) (by C-G formula based on SCr of 1.14 mg/dL (H)). Liver Function Tests: Recent Labs  Lab 04/23/18 1519  AST 20  ALT 12  ALKPHOS 84  BILITOT 0.7  PROT 6.6  ALBUMIN 3.0*   No results for input(s): LIPASE, AMYLASE in the last 168 hours. No results for input(s): AMMONIA in the last 168 hours. Coagulation Profile: No results for input(s): INR, PROTIME in the last 168 hours. Cardiac Enzymes: Recent Labs  Lab 04/23/18 1519  TROPONINI <0.03   BNP (last 3 results) No results for input(s): PROBNP in the last 8760 hours. HbA1C: No results for input(s): HGBA1C in the last 72 hours. CBG: No results for input(s): GLUCAP in the last 168 hours. Lipid Profile: No results for input(s): CHOL, HDL, LDLCALC, TRIG, CHOLHDL, LDLDIRECT in the last 72 hours. Thyroid Function Tests: No results for input(s): TSH, T4TOTAL, FREET4, T3FREE, THYROIDAB in the last 72 hours. Anemia Panel: No results for input(s): VITAMINB12, FOLATE, FERRITIN, TIBC, IRON, RETICCTPCT in the last 72 hours. Urine analysis:    Component Value Date/Time   COLORURINE STRAW (A) 03/14/2018 1836   APPEARANCEUR CLEAR 03/14/2018 1836   LABSPEC 1.008 03/14/2018 1836   PHURINE 5.0 03/14/2018 1836   GLUCOSEU NEGATIVE 03/14/2018 1836   GLUCOSEU NEGATIVE 04/17/2012 1232   HGBUR NEGATIVE 03/14/2018 1836   BILIRUBINUR NEGATIVE 03/14/2018 1836   BILIRUBINUR neg 12/22/2016 1124   KETONESUR NEGATIVE 03/14/2018 1836   PROTEINUR NEGATIVE 03/14/2018 1836   UROBILINOGEN negative (A) 12/22/2016 1124   UROBILINOGEN 0.2 01/07/2015 1751   NITRITE NEGATIVE 03/14/2018 1836    LEUKOCYTESUR MODERATE (A) 03/14/2018 1836   Sepsis Labs: $[email or web address]'[email or web address]$ (procalcitonin:4,lacticidven:4) )No results found for this or any previous visit (from the past 240 hour(s)).   Radiological Exams on Admission: Dg Chest 2 View  Result Date: 04/23/2018 CLINICAL DATA:  Shortness of breath  and chest pain. EXAM: CHEST - 2 VIEW COMPARISON:  Chest x-rays dated 03/14/2018 and 01/12/2017 and chest CT dated 07/16/2017 FINDINGS: Heart size and pulmonary vascularity are normal. No infiltrates or effusions. Minimal linear atelectasis at the left lung base. No acute bone abnormality. Old posterior right rib fractures. Surgical clips in the right breast and right axilla. IMPRESSION: No active cardiopulmonary disease. Electronically Signed   By: Lorriane Shire M.D.   On: 04/23/2018 11:56   Ct Angio Chest Pe W/cm &/or Wo Cm  Result Date: 04/23/2018 CLINICAL DATA:  Shortness of breath EXAM: CT ANGIOGRAPHY CHEST WITH CONTRAST TECHNIQUE: Multidetector CT imaging of the chest was performed using the standard protocol during bolus administration of intravenous contrast. Multiplanar CT image reconstructions and MIPs were obtained to evaluate the vascular anatomy. CONTRAST:  176mL ISOVUE-370 IOPAMIDOL (ISOVUE-370) INJECTION 76% COMPARISON:  Chest x-ray 04/23/2018, CT chest 07/16/2017 FINDINGS: Cardiovascular: Satisfactory opacification of the pulmonary arteries to the segmental level. No evidence of pulmonary embolism. Tortuous aorta. Moderate aortic atherosclerosis. No dissection. Normal heart size. No pericardial effusion. Mediastinum/Nodes: History of thyroidectomy. No mediastinal suspicious nodes. Esophagus within normal limits. Trachea midline Lungs/Pleura: No acute airspace disease, pleural effusion or pneumothorax Upper Abdomen: No acute abnormality. Musculoskeletal: Scoliosis and degenerative changes of the spine. No acute or suspicious abnormality. Postsurgical changes of the right breast corresponding to history of  lumpectomy Review of the MIP images confirms the above findings. IMPRESSION: 1. Negative for acute pulmonary embolus or aortic dissection. 2. Clear lung fields. Aortic Atherosclerosis (ICD10-I70.0). Electronically Signed   By: Donavan Foil M.D.   On: 04/23/2018 19:01    EKG: Independently reviewed. Incomplete RBBB, significant artifact, will repeat.   Assessment/Plan   1. Chest pain  - Presents with central chest tightness and burning with mild SOB, reports similar sxs occasionally for years, but becoming more frequent and intense  - Initial EKG with no acute ischemic features noted, but significant artifact, will repeat  - Initial troponin is <0.03 and CTA chest with clear lungs and neg for PE  - Continue cardiac monitoring, give a full-dose ASA, obtain serial troponin measurements, repeat EKG, continue daily ASA and ARB   2. Chronic diastolic CHF  - Appears compensated  - Continue diuretics and ARB, follow daily wts; she is intolerant of beta-blockers  3. Anxiety  - Stable  - Continue Zoloft and Xanax   4. Type II DM  - A1c was 6.6% in September  - Currently diet-controlled  - Check CBG's and use a low-intensity SSI if needed   5. Hypothyroidism  - Continue Synthroid    6. CKD stage III  - SCr is 1.14 on admission, similar to priors  - Renally-dose medications    DVT prophylaxis: sq heparin  Code Status: DNR  Family Communication: Daughter updated at bedside  Consults called: none Admission status: Observation    Vianne Bulls, MD [facility] Pager [phone]  If 7PM-7AM, please contact night-coverage [email or web address] Password [email or web address]  04/23/2018, 8:11 PM

## 2018-04-23 NOTE — ED Provider Notes (Signed)
[facility] EMERGENCY DEPARTMENT Provider Note   CSN: [id number] Arrival date & time: 04/23/18  1048     History   Chief Complaint Chief Complaint  Patient presents with  . Shortness of Breath  . Chest Pain    HPI Tamara Morales is a 84 y.o. female.  Patient brought in by EMS.  Her primary care doctors are [facility].  Patient is here for chest pain and shortness of breath.  Patient is from the friend's home in [place].  She is an independent living.  She developed shortness of breath 2 hours prior to arrival here.  And had some central chest tightness or squeezing.  Currently without any of the chest squeezing.  Patient is on 3 L of oxygen at all times.  Patient is listed as a DNR went over this with her.  She wants to be a DNR but she does not have a terminal illness at this time.  And as stated patient in an independent living situation.  The chest squeezing and tightness is of concern.  This could be an anginal equivalent type picture.  Patient has a history of hyperlipidemia and hypertension.  Also does have a history of GERD.  She has a history of sleep apnea.  Patient early on had a chest x-ray which showed no evidence of any abnormalities.  This was done prior to me seeing her.  We did not have any lab work.     Past Medical History:  Diagnosis Date  . ARTHRITIS, KNEES, BILATERAL   . Breast CA ([place]) 2000   s/p R lumpectomy and XRT  . DEPRESSION   . DJD (degenerative joint disease) of knee   . GERD   . HYPERLIPIDEMIA   . HYPERTENSION   . HYPOTHYROIDISM    postsurgical  . Hypoxia   . MIGRAINE HEADACHE   . Morbid obesity ([place])   . OSA (obstructive sleep apnea) 01/17/2011 dx  . Oxygen dependent   . Urge incontinence   . UTI (lower urinary tract infection) 12/2014    Patient Active Problem List   Diagnosis Date Noted  . Acute on chronic diastolic (congestive) heart failure ([place]) 03/14/2018  . Diabetes mellitus type 2 in obese ([place])  03/12/2018  . Lymphedema 11/20/2017  . Counseling regarding advanced care planning and goals of care 09/20/2017  . URI (upper respiratory infection) 08/30/2017  . Hypomagnesemia 07/17/2017  . Chest pain 07/16/2017  . Prediabetes 07/03/2017  . Hyperlipidemia LDL goal <70 07/03/2017  . Upper airway cough syndrome 06/27/2017  . CKD (chronic kidney disease) stage 3, GFR 30-59 ml/min ([facility]) 05/22/2017  . Bilateral dry eyes 04/20/2017  . Hemorrhoids 04/09/2017  . Chronic acquired lymphedema 03/30/2017  . Chronic constipation 03/30/2017  . Weight gain 12/07/2016  . Neck pain on left side 10/05/2016  . Chronic left shoulder pain 08/10/2016  . Hematuria 08/10/2016  . Numbness and tingling of right leg 04/06/2016  . Contusion of right breast 04/06/2016  . IBS (irritable bowel syndrome) 11/18/2015  . Hypokalemia 07/07/2015  . Acid reflux 07/05/2015  . Lymphedema of both lower extremities 07/02/2015  . UTI (urinary tract infection) 07/02/2015  . Ureterolithiasis   . Ureteral stone with hydronephrosis 06/28/2015  . Right ureteral calculus 06/27/2015  . Depression with anxiety 06/27/2015  . History of breast cancer 06/27/2015  . Abdominal pain, RLQ (right lower quadrant) 06/27/2015  . Obesity hypoventilation syndrome ([place]) 11/27/2014  . Orthopnea 04/09/2013  . SOB (shortness of breath) 03/13/2013  .  Other chest pain 11/30/2012  . Cough 03/05/2010  . Morbid obesity ([place]) 10/04/2009  . Hypothyroidism (acquired) 08/05/2009  . Essential hypertension 08/05/2009    Past Surgical History:  Procedure Laterality Date  . ABDOMINAL HYSTERECTOMY    . ADENOIDECTOMY    . BREAST BIOPSY  2000  . BREAST LUMPECTOMY  2000  . CATARACT EXTRACTION    . CHOLECYSTECTOMY  1995  . CYSTOSCOPY/URETEROSCOPY/HOLMIUM LASER Right 06/28/2015   Procedure: CYSTOSCOPY RIGHT RETROGRAD RIGHT URETEROSCOPY/HOLMIUM LASER WITH RIGHT STENT PLACEMENT;  Surgeon: Alexis Frock, MD;  Location: [facility];  Service: Urology;   Laterality: Right;  . PARATHYROIDECTOMY     2-3 removed  . REPLACEMENT TOTAL KNEE BILATERAL  1999, 2005  . Right leg femur repaired    . THYROIDECTOMY    . TONSILLECTOMY    . TUBAL LIGATION       OB History    Gravida  5   Para  5   Term      Preterm      AB      Living        SAB      TAB      Ectopic      Multiple      Live Births               Home Medications    Prior to Admission medications   Medication Sig Start Date End Date Taking? Authorizing Provider  acetaminophen (TYLENOL) 325 MG tablet Take 650 mg by mouth every 8 (eight) hours as needed for mild pain or moderate pain.    Yes [provider]  ALPRAZolam (XANAX) 0.5 MG tablet TAKE 1 TABLET TWICE DAILY AS NEEDED FOR ANXIETY. Patient taking differently: Take 0.5 mg by mouth 2 (two) times daily as needed for anxiety.  03/27/18  Yes Mast, Man X, NP  aspirin 81 MG tablet Take 81 mg by mouth daily.    Yes [provider]  furosemide (LASIX) 40 MG tablet Take 1.5 tablets (60 mg total) by mouth daily. Along with metolazone and potassium supplement 11/20/17  Yes Pandey, Mahima, MD  levothyroxine (SYNTHROID, LEVOTHROID) 125 MCG tablet Take 1 tablet (125 mcg total) by mouth daily. 10/11/17  Yes Blanchie Serve, MD  loratadine (CLARITIN) 10 MG tablet Take 10 mg by mouth daily as needed for allergies.    Yes [provider]  losartan (COZAAR) 50 MG tablet TAKE 2 TABLETS ONCE A DAY. HOLD FOR SYSTOLIC BLOOD PRESSURE LESS [id number]. Patient taking differently: Take 100 mg by mouth See admin instructions. Take 100 mg by mouth once a day and HOLD IF SYSTOLIC READING IS <889 16/94/50  Yes Mast, Man X, NP  metolazone (ZAROXOLYN) 2.5 MG tablet Take 1 tablet (2.5 mg total) by mouth daily. 03/27/17  Yes Blanchie Serve, MD  omeprazole (PRILOSEC) 20 MG capsule TAKE (1) CAPSULE DAILY. Patient taking differently: Take 20 mg by mouth daily.  11/15/17  Yes Mast, Man X, NP  ondansetron (ZOFRAN) 4 MG tablet  TAKE (1) TABLET BY MOUTH EVERY SIX HOURS AS NEEDED FOR NAUSEA. Patient taking differently: Take 4 mg by mouth every 6 (six) hours as needed for nausea.  03/26/18  Yes Mast, Man X, NP  potassium chloride SA (K-DUR,KLOR-CON) 20 MEQ tablet Take 60 mEq by mouth 2 (two) times daily.   Yes [provider]  psyllium (REGULOID) 0.52 g capsule Take 1.04 g by mouth daily as needed (for mild constipation).    Yes [provider]  sertraline (ZOLOFT) 50 MG tablet Take 1 tablet (50 mg total) by mouth daily. With 25 mg daily for total of 75 mg daily Patient taking differently: Take 50 mg by mouth daily.  12/18/17  Yes Blanchie Serve, MD  metolazone (ZAROXOLYN) 2.5 MG tablet TAKE 1 TABLET EACH DAY. Patient not taking: Reported on 04/23/2018 04/01/18   Mast, Man X, NP    Family History Family History  Problem Relation Age of Onset  . Emphysema Sister   . Coronary artery disease Neg Hx     Social History Social History   Tobacco Use  . Smoking status: Former Smoker    Packs/day: 0.25    Years: 2.00    Pack years: 0.50    Types: Cigarettes    Last attempt to quit: 06/19/1974    Years since quitting: 43.8  . Smokeless tobacco: Never Used  . Tobacco comment: Quit in late 20's  Substance Use Topics  . Alcohol use: No    Alcohol/week: 0.0 standard drinks    Comment: rare  . Drug use: No     Allergies   Azithromycin; Beta adrenergic blockers; Ciprofloxacin; Codeine; Darvon [propoxyphene]; Epinephrine; Klonopin [clonazepam]; Oxycodone; Paxil [paroxetine hydrochloride]; Prednisone; Propoxyphene hcl; Tape; Torsemide; Tramadol; Vicodin [hydrocodone-acetaminophen]; Meloxicam; and Vancomycin   Review of Systems Review of Systems  Constitutional: Negative for fever.  HENT: Negative for congestion.   Eyes: Negative for visual disturbance.  Respiratory: Positive for shortness of breath.   Cardiovascular: Positive for chest pain.  Gastrointestinal: Negative for abdominal pain.    Genitourinary: Negative for dysuria.  Musculoskeletal: Negative for back pain.  Skin: Negative for rash.  Neurological: Negative for speech difficulty, weakness, numbness and headaches.  Hematological: Does not bruise/bleed easily.  Psychiatric/Behavioral: Negative for confusion.     Physical Exam Updated Vital Signs BP 113/65   Pulse 78   Temp 97.6 F (36.4 C) (Oral)   Resp (!) 22   Ht 1.651 m ($Remove'5\' 5"'bWPFIsh$ )   Wt 127 kg   SpO2 94%   BMI 46.59 kg/m   Physical Exam  Constitutional: She is oriented to person, place, and time. She appears well-developed and well-nourished. No distress.  HENT:  Head: Normocephalic and atraumatic.  Mouth/Throat: Oropharynx is clear and moist.  Eyes: Pupils are equal, round, and reactive to light. Conjunctivae and EOM are normal.  Neck: Neck supple.  Cardiovascular: Normal rate, regular rhythm and intact distal pulses.  Pulmonary/Chest: Effort normal and breath sounds normal. No respiratory distress.  Abdominal: Soft. Bowel sounds are normal. There is no tenderness.  Musculoskeletal: Normal range of motion.  Neurological: She is alert and oriented to person, place, and time. No cranial nerve deficit or sensory deficit. She exhibits normal muscle tone. Coordination normal.  Skin: Skin is warm.  Nursing note and vitals reviewed.    ED Treatments / Results  Labs (all labs ordered are listed, but only abnormal results are displayed) Labs Reviewed  COMPREHENSIVE METABOLIC PANEL - Abnormal; Notable for the following components:      Result Value   Glucose, Bld 161 (*)    Creatinine, Ser 1.14 (*)    Calcium 8.2 (*)    Albumin 3.0 (*)    GFR calc non Af Amer 43 (*)    GFR calc Af Amer 49 (*)    All other components within normal limits  CBC WITH DIFFERENTIAL/PLATELET - Abnormal; Notable for the following components:   MCHC 29.8 (*)    Platelets 141 (*)    All other components within  normal limits  BRAIN NATRIURETIC PEPTIDE  TROPONIN I   URINALYSIS, ROUTINE W REFLEX MICROSCOPIC   Results for orders placed or performed during the hospital encounter of 04/23/18  Comprehensive metabolic panel  Result Value Ref Range   Sodium 141 135 - 145 mmol/L   Potassium 4.1 3.5 - 5.1 mmol/L   Chloride 102 98 - 111 mmol/L   CO2 30 22 - 32 mmol/L   Glucose, Bld 161 (H) 70 - 99 mg/dL   BUN 19 8 - 23 mg/dL   Creatinine, Ser 1.14 (H) 0.44 - 1.00 mg/dL   Calcium 8.2 (L) 8.9 - 10.3 mg/dL   Total Protein 6.6 6.5 - 8.1 g/dL   Albumin 3.0 (L) 3.5 - 5.0 g/dL   AST 20 15 - 41 U/L   ALT 12 0 - 44 U/L   Alkaline Phosphatase 84 38 - 126 U/L   Total Bilirubin 0.7 0.3 - 1.2 mg/dL   GFR calc non Af Amer 43 (L) >60 mL/min   GFR calc Af Amer 49 (L) >60 mL/min   Anion gap 9 5 - 15  CBC with Differential/Platelet  Result Value Ref Range   WBC 7.1 4.0 - 10.5 K/uL   RBC 4.43 3.87 - 5.11 MIL/uL   Hemoglobin 12.6 12.0 - 15.0 g/dL   HCT 42.3 36.0 - 46.0 %   MCV 95.5 80.0 - 100.0 fL   MCH 28.4 26.0 - 34.0 pg   MCHC 29.8 (L) 30.0 - 36.0 g/dL   RDW 15.3 11.5 - 15.5 %   Platelets 141 (L) 150 - 400 K/uL   nRBC 0.0 0.0 - 0.2 %   Neutrophils Relative % 76 %   Neutro Abs 5.4 1.7 - 7.7 K/uL   Lymphocytes Relative 15 %   Lymphs Abs 1.0 0.7 - 4.0 K/uL   Monocytes Relative 6 %   Monocytes Absolute 0.4 0.1 - 1.0 K/uL   Eosinophils Relative 2 %   Eosinophils Absolute 0.2 0.0 - 0.5 K/uL   Basophils Relative 1 %   Basophils Absolute 0.1 0.0 - 0.1 K/uL   Immature Granulocytes 0 %   Abs Immature Granulocytes 0.02 0.00 - 0.07 K/uL  Brain natriuretic peptide  Result Value Ref Range   B Natriuretic Peptide 97.3 0.0 - 100.0 pg/mL  Troponin I  Result Value Ref Range   Troponin I <0.03 <0.03 ng/mL     EKG EKG Interpretation  Date/Time:  Tuesday April 23 2018 10:56:48 EST Ventricular Rate:  92 PR Interval:    QRS Duration: 118 QT Interval:  403 QTC Calculation: 496 R Axis:   -19 Text Interpretation:  Interpretation limited secondary to artifact  Incomplete right bundle branch block Low voltage, extremity and precordial leads Borderline ST depression, lateral leads Confirmed by Fredia Sorrow [id number]) on 04/23/2018 12:20:55 PM   Radiology Dg Chest 2 View  Result Date: 04/23/2018 CLINICAL DATA:  Shortness of breath and chest pain. EXAM: CHEST - 2 VIEW COMPARISON:  Chest x-rays dated 03/14/2018 and 01/12/2017 and chest CT dated 07/16/2017 FINDINGS: Heart size and pulmonary vascularity are normal. No infiltrates or effusions. Minimal linear atelectasis at the left lung base. No acute bone abnormality. Old posterior right rib fractures. Surgical clips in the right breast and right axilla. IMPRESSION: No active cardiopulmonary disease. Electronically Signed   By: Lorriane Shire M.D.   On: 04/23/2018 11:56    Procedures Procedures (including critical care time)  Medications Ordered in ED Medications  ondansetron (ZOFRAN) injection 4 mg (4 mg Intravenous Given  04/23/18 1459)     Initial Impression / Assessment and Plan / ED Course  I have reviewed the triage vital signs and the nursing notes.  Pertinent labs & imaging results that were available during my care of the patient were reviewed by me and considered in my medical decision making (see chart for details).    Patient's main concern is his chest squeezing and tightness that has occurred.  Which could be anginal equivalent.  Patient's initial EKG without any acute STEMI changes.  Chest x-ray negative.  Initial troponin BNP normal.  Patient will have serial troponin.  Be reevaluated by the overnight physician.  May be a candidate for discharge home.  Obviously patient has recurrent squeezing may require admission.      Final Clinical Impressions(s) / ED Diagnoses   Final diagnoses:  Precordial pain    ED Discharge Orders    None       Fredia Sorrow, MD 04/23/18 [phone]

## 2018-04-23 NOTE — ED Provider Notes (Signed)
Patient care assumed at 1600. Patient here for evaluation is squeezing chest pain and shortness of breath. She does have a new oxygen requirement in the emergency department. CTA obtained and is negative for PE. She does have ongoing pain, medicine consulted for observation.   Quintella Reichert, MD 04/24/18 [phone]

## 2018-04-23 NOTE — ED Notes (Signed)
Got patient undress on the monitor did ekg shown to Dr Rogene Houston patient is resting with nurse at bedside and call bell in reach

## 2018-04-24 ENCOUNTER — Observation Stay (HOSPITAL_COMMUNITY): Payer: Medicare Other

## 2018-04-24 DIAGNOSIS — N183 Chronic kidney disease, stage 3 (moderate): Secondary | ICD-10-CM | POA: Diagnosis not present

## 2018-04-24 DIAGNOSIS — F419 Anxiety disorder, unspecified: Secondary | ICD-10-CM | POA: Diagnosis not present

## 2018-04-24 DIAGNOSIS — E669 Obesity, unspecified: Secondary | ICD-10-CM | POA: Diagnosis not present

## 2018-04-24 DIAGNOSIS — L89159 Pressure ulcer of sacral region, unspecified stage: Secondary | ICD-10-CM | POA: Diagnosis not present

## 2018-04-24 DIAGNOSIS — E1122 Type 2 diabetes mellitus with diabetic chronic kidney disease: Secondary | ICD-10-CM | POA: Diagnosis not present

## 2018-04-24 DIAGNOSIS — E1169 Type 2 diabetes mellitus with other specified complication: Secondary | ICD-10-CM

## 2018-04-24 DIAGNOSIS — L8990 Pressure ulcer of unspecified site, unspecified stage: Secondary | ICD-10-CM

## 2018-04-24 DIAGNOSIS — E662 Morbid (severe) obesity with alveolar hypoventilation: Secondary | ICD-10-CM | POA: Diagnosis not present

## 2018-04-24 DIAGNOSIS — L8991 Pressure ulcer of unspecified site, stage 1: Secondary | ICD-10-CM | POA: Diagnosis not present

## 2018-04-24 DIAGNOSIS — J449 Chronic obstructive pulmonary disease, unspecified: Secondary | ICD-10-CM | POA: Diagnosis not present

## 2018-04-24 DIAGNOSIS — R079 Chest pain, unspecified: Secondary | ICD-10-CM | POA: Diagnosis not present

## 2018-04-24 DIAGNOSIS — R072 Precordial pain: Secondary | ICD-10-CM | POA: Diagnosis not present

## 2018-04-24 DIAGNOSIS — I130 Hypertensive heart and chronic kidney disease with heart failure and stage 1 through stage 4 chronic kidney disease, or unspecified chronic kidney disease: Secondary | ICD-10-CM | POA: Diagnosis not present

## 2018-04-24 DIAGNOSIS — Z6841 Body Mass Index (BMI) 40.0 and over, adult: Secondary | ICD-10-CM | POA: Diagnosis not present

## 2018-04-24 DIAGNOSIS — I5032 Chronic diastolic (congestive) heart failure: Secondary | ICD-10-CM | POA: Diagnosis not present

## 2018-04-24 DIAGNOSIS — E876 Hypokalemia: Secondary | ICD-10-CM | POA: Diagnosis not present

## 2018-04-24 DIAGNOSIS — Z66 Do not resuscitate: Secondary | ICD-10-CM | POA: Diagnosis not present

## 2018-04-24 LAB — BASIC METABOLIC PANEL
ANION GAP: 9 (ref 5–15)
BUN: 13 mg/dL (ref 8–23)
CHLORIDE: 100 mmol/L (ref 98–111)
CO2: 32 mmol/L (ref 22–32)
Calcium: 7.9 mg/dL — ABNORMAL LOW (ref 8.9–10.3)
Creatinine, Ser: 0.94 mg/dL (ref 0.44–1.00)
GFR calc Af Amer: >60 mL/min (ref >60)
GFR calc non Af Amer: 54 mL/min — ABNORMAL LOW (ref >60)
GLUCOSE: 103 mg/dL — AB (ref 70–99)
POTASSIUM: 3.1 mmol/L — AB (ref 3.5–5.1)
Sodium: 141 mmol/L (ref 135–145)

## 2018-04-24 LAB — GLUCOSE, CAPILLARY
GLUCOSE-CAPILLARY: 148 mg/dL — AB (ref 70–99)
Glucose-Capillary: 111 mg/dL — ABNORMAL HIGH (ref 70–99)
Glucose-Capillary: 142 mg/dL — ABNORMAL HIGH (ref 70–99)
Glucose-Capillary: 104 mg/dL — ABNORMAL HIGH (ref 70–99)

## 2018-04-24 LAB — TROPONIN I
Troponin I: <0.03 ng/mL (ref <0.03)
Troponin I: <0.03 ng/mL (ref <0.03)

## 2018-04-24 MED ORDER — REGADENOSON 0.4 MG/5ML IV SOLN
0.4000 mg | Freq: Once | INTRAVENOUS | Status: AC
  Administered 2018-04-24: 0.4 mg via INTRAVENOUS

## 2018-04-24 MED ORDER — FUROSEMIDE 40 MG PO TABS
60.0000 mg | ORAL_TABLET | Freq: Every day | ORAL | Status: DC
  Administered 2018-04-25: 60 mg via ORAL
  Filled 2018-04-24: qty 1

## 2018-04-24 MED ORDER — ORAL CARE MOUTH RINSE
15.0000 mL | Freq: Two times a day (BID) | OROMUCOSAL | Status: DC
  Administered 2018-04-24: 15 mL via OROMUCOSAL

## 2018-04-24 MED ORDER — FUROSEMIDE 10 MG/ML IJ SOLN: 60.0000 mg | Freq: Once | INTRAMUSCULAR | Status: DC

## 2018-04-24 MED ORDER — POTASSIUM CHLORIDE CRYS ER 20 MEQ PO TBCR
60.0000 meq | EXTENDED_RELEASE_TABLET | Freq: Two times a day (BID) | ORAL | Status: DC
  Administered 2018-04-24 – 2018-04-25 (×3): 60 meq via ORAL
  Filled 2018-04-24 (×3): qty 3

## 2018-04-24 MED ORDER — PANTOPRAZOLE SODIUM 40 MG PO TBEC
40.0000 mg | DELAYED_RELEASE_TABLET | Freq: Two times a day (BID) | ORAL | Status: DC
  Administered 2018-04-24 – 2018-04-25 (×2): 40 mg via ORAL
  Filled 2018-04-24 (×2): qty 1

## 2018-04-24 MED ORDER — REGADENOSON 0.4 MG/5ML IV SOLN
INTRAVENOUS | Status: AC
  Administered 2018-04-24: 12:00:00
  Filled 2018-04-24: qty 5

## 2018-04-24 NOTE — Consult Note (Addendum)
Cardiology Consultation:   Patient ID: YITTEL EMRICH; [id number]; [birth year]   Admit date: 04/23/2018 Date of Consult: 04/24/2018  Primary Care Provider: Blanchie Serve, MD Primary Cardiologist: Dr. Percival Morales; last seen in 2017 Primary Electrophysiologist:  None   Patient Profile:   Tamara Morales is a 84 y.o. female with a PMH of HTN, HLD, Chronic diastolic CHF, DM type 2, hypothyroidism, OSA on CPAP, and CKD stage 3, who is being seen today for the evaluation of chest pain at the request of Dr. Eliseo Morales.  History of Present Illness:   Tamara Morales was in her usual state of health until the 04/23/18 when she experienced an episode of chest pain at rest. She describes it as a central tightness/burning pain with associated SOB and nausea without vomiting. Her symptoms resolved spontaneously after 15 minutes . She reports having 1-2 episodes a day for the past couple weeks which she felt coincided with onset of a non-productive cough. She reported yesterdays episode caused more SOB which prompted her to present to the ED for further evaluation.    She has no prior heart disease history. She had a remote cardiac catheterization 20 years ago which she states was without abnormalities. Her last ischemic evaluation was a NST in 2014 which was negative for ischemia. Last echo 02/2018 with EF 65-70%, G1DD, mild MR/TR, and mild pulmHTN.   At the time of this evaluation she is chest pain free. She denies associated diaphoresis, dizziness, lightheadedness, or syncope with any of her chest pain episodes. She reports intermittent bilateral arm numbness with coughing. She has stable chronic LE edema. She tries to eat a low salt diet but is at the mercy of her assisted living facility and she reports that they put salt in everything.   Hospital course: VSS with BP on the soft side. Labs notable for electrolytes wnl, Cr 1.14 (baseline), Hgb 12.6, PLT 141, BNP 97, Trop negative x3. EKG with sinus rhythm with  no STE/D, no TWI. CXR without acute findings. CTA chest with clear lungs, no PE, moderate aortic atherosclerosis noted. Patient was given ASA $RemoveB'324mg'duTNzllF$  on arrival to the ED. Admitted to medicine and home medications continued. Cardiology asked to evaluate for chest pain.   Past Medical History:  Diagnosis Date  . ARTHRITIS, KNEES, BILATERAL   . Breast CA ([place]) 2000   s/p R lumpectomy and XRT  . DEPRESSION   . DJD (degenerative joint disease) of knee   . GERD   . HYPERLIPIDEMIA   . HYPERTENSION   . HYPOTHYROIDISM    postsurgical  . Hypoxia   . MIGRAINE HEADACHE   . Morbid obesity ([place])   . OSA (obstructive sleep apnea) 01/17/2011 dx  . Oxygen dependent   . Urge incontinence   . UTI (lower urinary tract infection) 12/2014    Past Surgical History:  Procedure Laterality Date  . ABDOMINAL HYSTERECTOMY    . ADENOIDECTOMY    . BREAST BIOPSY  2000  . BREAST LUMPECTOMY  2000  . CATARACT EXTRACTION    . CHOLECYSTECTOMY  1995  . CYSTOSCOPY/URETEROSCOPY/HOLMIUM LASER Right 06/28/2015   Procedure: CYSTOSCOPY RIGHT RETROGRAD RIGHT URETEROSCOPY/HOLMIUM LASER WITH RIGHT STENT PLACEMENT;  Surgeon: Alexis Frock, MD;  Location: [facility];  Service: Urology;  Laterality: Right;  . PARATHYROIDECTOMY     2-3 removed  . REPLACEMENT TOTAL KNEE BILATERAL  1999, 2005  . Right leg femur repaired    . THYROIDECTOMY    . TONSILLECTOMY    . TUBAL LIGATION  Home Medications:  Prior to Admission medications   Medication Sig Start Date End Date Taking? Authorizing Provider  acetaminophen (TYLENOL) 325 MG tablet Take 650 mg by mouth every 8 (eight) hours as needed for mild pain or moderate pain.    Yes [provider]  ALPRAZolam (XANAX) 0.5 MG tablet TAKE 1 TABLET TWICE DAILY AS NEEDED FOR ANXIETY. Patient taking differently: Take 0.5 mg by mouth 2 (two) times daily as needed for anxiety.  03/27/18  Yes Mast, Man X, NP  aspirin 81 MG tablet Take 81 mg by mouth daily.    Yes [provider]  furosemide (LASIX) 40 MG tablet Take 1.5 tablets (60 mg total) by mouth daily. Along with metolazone and potassium supplement 11/20/17  Yes Pandey, Mahima, MD  levothyroxine (SYNTHROID, LEVOTHROID) 125 MCG tablet Take 1 tablet (125 mcg total) by mouth daily. 10/11/17  Yes Tamara Serve, MD  loratadine (CLARITIN) 10 MG tablet Take 10 mg by mouth daily as needed for allergies.    Yes [provider]  losartan (COZAAR) 50 MG tablet TAKE 2 TABLETS ONCE A DAY. HOLD FOR SYSTOLIC BLOOD PRESSURE LESS [id number]. Patient taking differently: Take 100 mg by mouth See admin instructions. Take 100 mg by mouth once a day and HOLD IF SYSTOLIC READING IS <212 24/82/50  Yes Mast, Man X, NP  metolazone (ZAROXOLYN) 2.5 MG tablet Take 1 tablet (2.5 mg total) by mouth daily. 03/27/17  Yes Tamara Serve, MD  omeprazole (PRILOSEC) 20 MG capsule TAKE (1) CAPSULE DAILY. Patient taking differently: Take 20 mg by mouth daily.  11/15/17  Yes Mast, Man X, NP  ondansetron (ZOFRAN) 4 MG tablet TAKE (1) TABLET BY MOUTH EVERY SIX HOURS AS NEEDED FOR NAUSEA. Patient taking differently: Take 4 mg by mouth every 6 (six) hours as needed for nausea.  03/26/18  Yes Mast, Man X, NP  potassium chloride SA (K-DUR,KLOR-CON) 20 MEQ tablet Take 60 mEq by mouth 2 (two) times daily.   Yes [provider]  psyllium (REGULOID) 0.52 g capsule Take 1.04 g by mouth daily as needed (for mild constipation).    Yes [provider]  sertraline (ZOLOFT) 50 MG tablet Take 1 tablet (50 mg total) by mouth daily. With 25 mg daily for total of 75 mg daily Patient taking differently: Take 50 mg by mouth daily.  12/18/17  Yes Tamara Serve, MD    Inpatient Medications: Scheduled Meds: . aspirin EC  81 mg Oral Daily  . furosemide  60 mg Oral Daily  . heparin  5,000 Units Subcutaneous Q8H  . insulin aspart  0-5 Units Subcutaneous QHS  . insulin aspart  0-9 Units Subcutaneous TID WC  . levothyroxine  125 mcg Oral QAC breakfast  .  losartan  100 mg Oral Daily  . mouth rinse  15 mL Mouth Rinse BID  . metolazone  2.5 mg Oral Daily  . pantoprazole  40 mg Oral Daily  . sertraline  75 mg Oral Daily   Continuous Infusions:  PRN Meds: acetaminophen, ALPRAZolam, morphine injection, nitroGLYCERIN, ondansetron (ZOFRAN) IV  Allergies:    Allergies  Allergen Reactions  . Azithromycin Itching  . Beta Adrenergic Blockers Other (See Comments)    Depression   . Ciprofloxacin Other (See Comments)    Edgy and very jumpy    . Codeine Nausea And Vomiting  . Darvon [Propoxyphene] Nausea And Vomiting  . Epinephrine Other (See Comments)    Rapid pulse, sweats  . Klonopin [Clonazepam] Other (See Comments)  Makes her feel very suicidal   . Oxycodone Other (See Comments)    Patient felt like it altered her mental status "Crazy" . Hallucinations later as well.  [name] Paxil [Paroxetine Hydrochloride] Other (See Comments)    Severe depression   . Prednisone Other (See Comments)    "makes me feel really bad"  . Propoxyphene Hcl Nausea And Vomiting  . Tape Itching  . Torsemide Other (See Comments)    Patient stated that she doesn't recall the reaction  . Tramadol Other (See Comments)    Feels "jittery"  . Vicodin [Hydrocodone-Acetaminophen] Other (See Comments)    Hallucinations  . Meloxicam Diarrhea  . Vancomycin Rash    Localized rash related to infusion rate    Social History:   Social History   Socioeconomic History  . Marital status: Married    Spouse name: Not on file  . Number of children: 5  . Years of education: Not on file  . Highest education level: Not on file  Occupational History  . Occupation: RETIRED    Employer: RETIRED    Comment: worked on Hotel manager  . Financial resource strain: Not hard at all  . Food insecurity:    Worry: Never true    Inability: Never true  . Transportation needs:    Medical: No    Non-medical: No  Tobacco Use  . Smoking status: Former Smoker    Packs/day:  0.25    Years: 2.00    Pack years: 0.50    Types: Cigarettes    Last attempt to quit: 06/19/1974    Years since quitting: 43.8  . Smokeless tobacco: Never Used  . Tobacco comment: Quit in late 20's  Substance and Sexual Activity  . Alcohol use: No    Alcohol/week: 0.0 standard drinks    Comment: rare  . Drug use: No  . Sexual activity: Never  Lifestyle  . Physical activity:    Days per week: 0 days    Minutes per session: 0 min  . Stress: Only a little  Relationships  . Social connections:    Talks on phone: More than three times a week    Gets together: Once a week    Attends religious service: More than 4 times per year    Active member of club or organization: Yes    Attends meetings of clubs or organizations: 1 to 4 times per year    Relationship status: Married  . Intimate partner violence:    Fear of current or ex partner: No    Emotionally abused: No    Physically abused: No    Forced sexual activity: No  Other Topics Concern  . Not on file  Social History Narrative   Married to husband that has dementia (he lives in the skilled nursing wing) and this is a big stressor. 5 children. 10 grandchildren. 2 greatgrandchildren. All children Victor      Lives at [facility], in independent living.      Retired from multiple different Community education officer, university, Psychologist, educational.       Hobbies: Management consultant, write poetry, craft dolls    Family History:    Family History  Problem Relation Age of Onset  . Emphysema Sister   . Coronary artery disease Neg Hx      ROS:  Please see the history of present illness.   All other ROS reviewed and negative.     Physical Exam/Data:   Vitals:   04/23/18  2048 04/23/18 2330 04/24/18 0129 04/24/18 0542  BP: 99/62 (!) 101/57  118/67  Pulse: 70 78  86  Resp: $Remo'16 18  16  'GjLNh$ Temp: 98.3 F (36.8 C) 97.8 F (36.6 C)  97.7 F (36.5 C)  TempSrc: Oral Oral  Oral  SpO2: 94% 97%  97%  Weight:   130.7 kg   Height: $Remove'5\' 3"'sUyNzDT$  (1.6 m)        Intake/Output Summary (Last 24 hours) at 04/24/2018 0812 Last data filed at 04/23/2018 2100 Gross per 24 hour  Intake 125 ml  Output 1200 ml  Net -1075 ml   Filed Weights   04/23/18 1056 04/24/18 0129  Weight: 127 kg 130.7 kg   Body mass index is 51.04 kg/m.  General: Well nourished, well developed, obese elderly female laying in bed in no acute distress HEENT: sclera anicteric  Neck: no JVD Vascular: No carotid bruits; distal pulses 2+ bilaterally Cardiac:  normal S1, S2; RRR; no murmurs, rubs, or gallops Lungs:  clear to auscultation bilaterally, no wheezing, rhonchi or rales  Abd: NABS, soft, nontender, no hepatomegaly Ext: no edema Musculoskeletal:  No deformities, BUE and BLE strength normal and equal Skin: warm and dry  Neuro:  CNs 2-12 intact, no focal abnormalities noted Psych:  Normal affect   EKG:  The EKG was personally reviewed and demonstrates:  NSR with no STE/D, no TWI Telemetry:  Telemetry was personally reviewed and demonstrates:  Sinus rhythm with occasional PVCs  Relevant CV Studies: NST: 2014 IMPRESSION: No evidence for pharmacologically induced ischemia.  Normal wall motion and calculated ejection fraction is 63%.  Echocardiogram 02/2018: Study Conclusions  - Left ventricle: The cavity size was normal. Wall thickness was   normal. Systolic function was vigorous. The estimated ejection   fraction was in the range of 65% to 70%. Wall motion was normal;   there were no regional wall motion abnormalities. Doppler   parameters are consistent with abnormal left ventricular   relaxation (grade 1 diastolic dysfunction). - Mitral valve: Calcified annulus. There was mild regurgitation. - Pulmonary arteries: Systolic pressure was mildly increased.  Impressions:  - Normal LV systolic function; mild diastolic dysfunction; mild MR   and TR; mild pulmonary hypertension.  Laboratory Data:  Chemistry Recent Labs  Lab 04/23/18 1519  NA 141  K 4.1   CL 102  CO2 30  GLUCOSE 161*  BUN 19  CREATININE 1.14*  CALCIUM 8.2*  GFRNONAA 43*  GFRAA 49*  ANIONGAP 9    Recent Labs  Lab 04/23/18 1519  PROT 6.6  ALBUMIN 3.0*  AST 20  ALT 12  ALKPHOS 84  BILITOT 0.7   Hematology Recent Labs  Lab 04/23/18 1519  WBC 7.1  RBC 4.43  HGB 12.6  HCT 42.3  MCV 95.5  MCH 28.4  MCHC 29.8*  RDW 15.3  PLT 141*   Cardiac Enzymes Recent Labs  Lab 04/23/18 1519 04/23/18 2120 04/24/18 0333  TROPONINI <0.03 <0.03 <0.03   No results for input(s): TROPIPOC in the last 168 hours.  BNP Recent Labs  Lab 04/23/18 1519  BNP 97.3    DDimer No results for input(s): DDIMER in the last 168 hours.  Radiology/Studies:  Dg Chest 2 View  Result Date: 04/23/2018 CLINICAL DATA:  Shortness of breath and chest pain. EXAM: CHEST - 2 VIEW COMPARISON:  Chest x-rays dated 03/14/2018 and 01/12/2017 and chest CT dated 07/16/2017 FINDINGS: Heart size and pulmonary vascularity are normal. No infiltrates or effusions. Minimal linear atelectasis at the left lung  base. No acute bone abnormality. Old posterior right rib fractures. Surgical clips in the right breast and right axilla. IMPRESSION: No active cardiopulmonary disease. Electronically Signed   By: Lorriane Shire M.D.   On: 04/23/2018 11:56   Ct Angio Chest Pe W/cm &/or Wo Cm  Result Date: 04/23/2018 CLINICAL DATA:  Shortness of breath EXAM: CT ANGIOGRAPHY CHEST WITH CONTRAST TECHNIQUE: Multidetector CT imaging of the chest was performed using the standard protocol during bolus administration of intravenous contrast. Multiplanar CT image reconstructions and MIPs were obtained to evaluate the vascular anatomy. CONTRAST:  191mL ISOVUE-370 IOPAMIDOL (ISOVUE-370) INJECTION 76% COMPARISON:  Chest x-ray 04/23/2018, CT chest 07/16/2017 FINDINGS: Cardiovascular: Satisfactory opacification of the pulmonary arteries to the segmental level. No evidence of pulmonary embolism. Tortuous aorta. Moderate aortic  atherosclerosis. No dissection. Normal heart size. No pericardial effusion. Mediastinum/Nodes: History of thyroidectomy. No mediastinal suspicious nodes. Esophagus within normal limits. Trachea midline Lungs/Pleura: No acute airspace disease, pleural effusion or pneumothorax Upper Abdomen: No acute abnormality. Musculoskeletal: Scoliosis and degenerative changes of the spine. No acute or suspicious abnormality. Postsurgical changes of the right breast corresponding to history of lumpectomy Review of the MIP images confirms the above findings. IMPRESSION: 1. Negative for acute pulmonary embolus or aortic dissection. 2. Clear lung fields. Aortic Atherosclerosis (ICD10-I70.0). Electronically Signed   By: Donavan Foil M.D.   On: 04/23/2018 19:01    Assessment and Plan:   1. Chest pain: patient presented with central chest tightness/burning pain with associated SOB and nausea which occurred at rest. EKG without ischemic changes. Trop negative x3. BNP wnl. CTA chest with clear lungs, no PE. Last ischemic evaluation was a NST in 2014 which was negative for ischemia. Echo 02/2018 with EF 65-70%, G1DD, mild MR/TR, and mild pulmHTN. Suspect symptoms are mostly pulmonary related, however risk factors for ACS include HTN, HLD, DM type 2, and obesity.  - Will plan for 2-day NST to r/o ischemia - Continue aspirin  2. Chronic diastolic CHF: patient reports stable chronic LE edema. BNP wnl this admission. CXR and CTA Chest with clear lungs.  - Continue home lasix, metolazone, and potassium supplement  3. HTN: BP well controlled, if not on the soft side - Continue home losartan with hold parameters  4. DM type 2: last A1C 6.6 02/2018; at goal of <7 - Continue management per primary team  5. Obesity hypoventilation syndrome: follows with Pulm outpatient.  Possible this is contributing to her symptoms.  - Continue management per primary team   6. OSA: compliant with CPAP - Continue CPAP     For questions or  updates, please contact [place] Please consult [email or web address] for contact info under Cardiology/STEMI.   Signed, Tamara Butts, PA-C  04/24/2018 8:12 AM [phone]  Patient examined chart reviewed Discussed care with patient and PA. Pain is atypical no ECG changes R/O CTA With no PE Given age and risk factors will order 2 day myovue to further risk stratify. Exam with obese female exp wheezing COPD on lung exam. Plus one LE edema soft abdomen and good peripheral pulses   Tamara Morales

## 2018-04-24 NOTE — Progress Notes (Signed)
Patient off floor for procedure 

## 2018-04-24 NOTE — Progress Notes (Signed)
Progress Note    Tamara Morales  YJE:[id number] DOB: 1931/03/20  DOA: 04/23/2018 PCP: Blanchie Serve, MD    Brief Narrative:    Medical records reviewed and are as summarized below:  Tamara Morales is an 84 y.o. female with medical history significant for OSA on CPAP, chronic hypoxic respiratory failure, chronic diastolic CHF, CKD 3, anxiety disorder, and hypothyroidism, now presenting to the emergency department for evaluation of chest discomfort and dyspnea.  Patient reports waking this morning with pain in her chest that she describes as a "tightness" and "burning."  Discomfort is localized to the central chest and associated with mild dyspnea and mild nausea without vomiting.  She was at rest when symptoms developed and have slowly eased off without any specific intervention.  She reports similar symptoms occasionally over the past couple years, but is concerned that it is becoming more frequent and more intense. Also reports a cough as well as numbness of b/l upper extremities that comes and goes.  Assessment/Plan:   Principal Problem:   Chest pain Active Problems:   Hypothyroidism (acquired)   Essential hypertension   CKD (chronic kidney disease) stage 3, GFR 30-59 ml/min (HCC)   Diabetes mellitus type 2 in obese (HCC)   Anxiety   Chronic respiratory failure with hypoxia (HCC)   Pressure injury of skin   Chest pain  - Presents with central chest tightness and burning with mild SOB, reports similar sxs occasionally for years, but becoming more frequent and intense  - increase PPI -2 day stress test  Chronic diastolic CHF  - Continue diuretics and ARB, follow daily wts; she is intolerant of beta-blockers  Cough -not on ACE -CT shows clear lung fields so doubt fluid overload -? GERD-- increase PPI -? Allergies  Anxiety  - Continue Zoloft and Xanax   Type II DM  - A1c was 6.6% in September  -SSI -diet controlled at home  Hypothyroidism  - Continue  Synthroid    CKD stage III  - trend  Hypokalemia -replete  obesity Body mass index is 51.04 kg/m.  RN Pressure Injury Documentation: Pressure Injury 04/24/18 Stage I -  Intact skin with non-blanchable redness of a localized area usually over a bony prominence. (Active)  04/24/18 0138   Location: Sacrum  Location Orientation: Lower  Staging: Stage I -  Intact skin with non-blanchable redness of a localized area usually over a bony prominence.  Wound Description (Comments):   Present on Admission: Yes   [place] section of friend's home-- gets around with a wheelchair and is incontinent of stool and urine when coughing  Family Communication/Anticipated D/C date and plan/Code Status   DVT prophylaxis: heparin Code Status: dnr  Family Communication:  Disposition Plan:   Medical Consultants:    None.   Anti-Infectives:    None  Subjective:   C/o cough x 1 month and 6 months of upper extremity numbness  Objective:    Vitals:   04/24/18 1034 04/24/18 1035 04/24/18 1036 04/24/18 1037  BP:  102/60 (!) 93/57   Pulse: 99 96 92 90  Resp:      Temp:      TempSrc:      SpO2:      Weight:      Height:        Intake/Output Summary (Last 24 hours) at 04/24/2018 1350 Last data filed at 04/23/2018 2100 Gross per 24 hour  Intake 125 ml  Output 1200 ml  Net -1075 ml  Filed Weights   04/23/18 1056 04/24/18 0129  Weight: 127 kg 130.7 kg    Exam: In bed, chronically ill appearing rrr No wheezing, diminished-- no increased work of breathing +BS, obese +LE edema A+OX3  Data Reviewed:   I have personally reviewed following labs and imaging studies:  Labs: Labs show the following:   Basic Metabolic Panel: Recent Labs  Lab 04/23/18 1519 04/24/18 0916  NA 141 141  K 4.1 3.1*  CL 102 100  CO2 30 32  GLUCOSE 161* 103*  BUN 19 13  CREATININE 1.14* 0.94  CALCIUM 8.2* 7.9*   GFR Estimated Creatinine Clearance: 57.8 mL/min (by C-G formula based on SCr  of 0.94 mg/dL). Liver Function Tests: Recent Labs  Lab 04/23/18 1519  AST 20  ALT 12  ALKPHOS 84  BILITOT 0.7  PROT 6.6  ALBUMIN 3.0*   No results for input(s): LIPASE, AMYLASE in the last 168 hours. No results for input(s): AMMONIA in the last 168 hours. Coagulation profile No results for input(s): INR, PROTIME in the last 168 hours.  CBC: Recent Labs  Lab 04/23/18 1519  WBC 7.1  NEUTROABS 5.4  HGB 12.6  HCT 42.3  MCV 95.5  PLT 141*   Cardiac Enzymes: Recent Labs  Lab 04/23/18 1519 04/23/18 2120 04/24/18 0333 04/24/18 0916  TROPONINI <0.03 <0.03 <0.03 <0.03   BNP (last 3 results) No results for input(s): PROBNP in the last 8760 hours. CBG: Recent Labs  Lab 04/23/18 2113 04/24/18 0654 04/24/18 1217  GLUCAP 93 111* 142*   D-Dimer: No results for input(s): DDIMER in the last 72 hours. Hgb A1c: No results for input(s): HGBA1C in the last 72 hours. Lipid Profile: No results for input(s): CHOL, HDL, LDLCALC, TRIG, CHOLHDL, LDLDIRECT in the last 72 hours. Thyroid function studies: No results for input(s): TSH, T4TOTAL, T3FREE, THYROIDAB in the last 72 hours.  Invalid input(s): FREET3 Anemia work up: No results for input(s): VITAMINB12, FOLATE, FERRITIN, TIBC, IRON, RETICCTPCT in the last 72 hours. Sepsis Labs: Recent Labs  Lab 04/23/18 1519  WBC 7.1    Microbiology No results found for this or any previous visit (from the past 240 hour(s)).  Procedures and diagnostic studies:  Dg Chest 2 View  Result Date: 04/23/2018 CLINICAL DATA:  Shortness of breath and chest pain. EXAM: CHEST - 2 VIEW COMPARISON:  Chest x-rays dated 03/14/2018 and 01/12/2017 and chest CT dated 07/16/2017 FINDINGS: Heart size and pulmonary vascularity are normal. No infiltrates or effusions. Minimal linear atelectasis at the left lung base. No acute bone abnormality. Old posterior right rib fractures. Surgical clips in the right breast and right axilla. IMPRESSION: No active  cardiopulmonary disease. Electronically Signed   By: Lorriane Shire M.D.   On: 04/23/2018 11:56   Ct Angio Chest Pe W/cm &/or Wo Cm  Result Date: 04/23/2018 CLINICAL DATA:  Shortness of breath EXAM: CT ANGIOGRAPHY CHEST WITH CONTRAST TECHNIQUE: Multidetector CT imaging of the chest was performed using the standard protocol during bolus administration of intravenous contrast. Multiplanar CT image reconstructions and MIPs were obtained to evaluate the vascular anatomy. CONTRAST:  177mL ISOVUE-370 IOPAMIDOL (ISOVUE-370) INJECTION 76% COMPARISON:  Chest x-ray 04/23/2018, CT chest 07/16/2017 FINDINGS: Cardiovascular: Satisfactory opacification of the pulmonary arteries to the segmental level. No evidence of pulmonary embolism. Tortuous aorta. Moderate aortic atherosclerosis. No dissection. Normal heart size. No pericardial effusion. Mediastinum/Nodes: History of thyroidectomy. No mediastinal suspicious nodes. Esophagus within normal limits. Trachea midline Lungs/Pleura: No acute airspace disease, pleural effusion or pneumothorax Upper Abdomen:  No acute abnormality. Musculoskeletal: Scoliosis and degenerative changes of the spine. No acute or suspicious abnormality. Postsurgical changes of the right breast corresponding to history of lumpectomy Review of the MIP images confirms the above findings. IMPRESSION: 1. Negative for acute pulmonary embolus or aortic dissection. 2. Clear lung fields. Aortic Atherosclerosis (ICD10-I70.0). Electronically Signed   By: Donavan Foil M.D.   On: 04/23/2018 19:01    Medications:   . aspirin EC  81 mg Oral Daily  . [START ON 04/25/2018] furosemide  60 mg Oral Daily  . heparin  5,000 Units Subcutaneous Q8H  . insulin aspart  0-5 Units Subcutaneous QHS  . insulin aspart  0-9 Units Subcutaneous TID WC  . levothyroxine  125 mcg Oral QAC breakfast  . losartan  100 mg Oral Daily  . mouth rinse  15 mL Mouth Rinse BID  . metolazone  2.5 mg Oral Daily  . pantoprazole  40 mg Oral  BID  . potassium chloride SA  60 mEq Oral BID  . sertraline  75 mg Oral Daily   Continuous Infusions:   LOS: 0 days   Geradine Girt  [facility]   *Please refer to [email or web address], password [email or web address] to get updated schedule on who will round on this patient, as hospitalists switch teams weekly. If 7PM-7AM, please contact night-coverage at [email or web address], password [email or web address] for any overnight needs.  04/24/2018, 1:50 PM

## 2018-04-24 NOTE — Care Management Obs Status (Signed)
[facility] NOTIFICATION   Patient Details  Name: KANIYA TRUEHEART MRN: [id number] Date of Birth: 09/09/1930   Medicare Observation Status Notification Given:  Yes    Midge Minium RN, BSN, NCM-BC, ACM-RN [phone] 04/24/2018, 4:20 PM

## 2018-04-25 ENCOUNTER — Observation Stay (HOSPITAL_COMMUNITY): Payer: Medicare Other

## 2018-04-25 DIAGNOSIS — E785 Hyperlipidemia, unspecified: Secondary | ICD-10-CM | POA: Diagnosis not present

## 2018-04-25 DIAGNOSIS — E039 Hypothyroidism, unspecified: Secondary | ICD-10-CM

## 2018-04-25 DIAGNOSIS — I10 Essential (primary) hypertension: Secondary | ICD-10-CM | POA: Diagnosis not present

## 2018-04-25 DIAGNOSIS — E669 Obesity, unspecified: Secondary | ICD-10-CM | POA: Diagnosis not present

## 2018-04-25 DIAGNOSIS — E1169 Type 2 diabetes mellitus with other specified complication: Secondary | ICD-10-CM | POA: Diagnosis not present

## 2018-04-25 DIAGNOSIS — E876 Hypokalemia: Secondary | ICD-10-CM | POA: Diagnosis not present

## 2018-04-25 DIAGNOSIS — Z66 Do not resuscitate: Secondary | ICD-10-CM | POA: Diagnosis not present

## 2018-04-25 DIAGNOSIS — N183 Chronic kidney disease, stage 3 (moderate): Secondary | ICD-10-CM | POA: Diagnosis not present

## 2018-04-25 DIAGNOSIS — E662 Morbid (severe) obesity with alveolar hypoventilation: Secondary | ICD-10-CM | POA: Diagnosis not present

## 2018-04-25 DIAGNOSIS — F419 Anxiety disorder, unspecified: Secondary | ICD-10-CM | POA: Diagnosis not present

## 2018-04-25 DIAGNOSIS — E1122 Type 2 diabetes mellitus with diabetic chronic kidney disease: Secondary | ICD-10-CM | POA: Diagnosis not present

## 2018-04-25 DIAGNOSIS — R079 Chest pain, unspecified: Secondary | ICD-10-CM | POA: Diagnosis not present

## 2018-04-25 DIAGNOSIS — I5032 Chronic diastolic (congestive) heart failure: Secondary | ICD-10-CM | POA: Diagnosis not present

## 2018-04-25 DIAGNOSIS — R072 Precordial pain: Secondary | ICD-10-CM | POA: Diagnosis not present

## 2018-04-25 DIAGNOSIS — J449 Chronic obstructive pulmonary disease, unspecified: Secondary | ICD-10-CM | POA: Diagnosis not present

## 2018-04-25 DIAGNOSIS — L8991 Pressure ulcer of unspecified site, stage 1: Secondary | ICD-10-CM | POA: Diagnosis not present

## 2018-04-25 DIAGNOSIS — Z6841 Body Mass Index (BMI) 40.0 and over, adult: Secondary | ICD-10-CM | POA: Diagnosis not present

## 2018-04-25 DIAGNOSIS — I130 Hypertensive heart and chronic kidney disease with heart failure and stage 1 through stage 4 chronic kidney disease, or unspecified chronic kidney disease: Secondary | ICD-10-CM | POA: Diagnosis not present

## 2018-04-25 LAB — BASIC METABOLIC PANEL
ANION GAP: 8 (ref 5–15)
BUN: 14 mg/dL (ref 8–23)
CHLORIDE: 100 mmol/L (ref 98–111)
CO2: 32 mmol/L (ref 22–32)
Calcium: 7.6 mg/dL — ABNORMAL LOW (ref 8.9–10.3)
Creatinine, Ser: 1.01 mg/dL — ABNORMAL HIGH (ref 0.44–1.00)
GFR, EST AFRICAN AMERICAN: 57 mL/min — AB (ref >60)
GFR, EST NON AFRICAN AMERICAN: 49 mL/min — AB (ref >60)
Glucose, Bld: 91 mg/dL (ref 70–99)
POTASSIUM: 3.8 mmol/L (ref 3.5–5.1)
SODIUM: 140 mmol/L (ref 135–145)

## 2018-04-25 LAB — CBC
HEMATOCRIT: 38.6 % (ref 36.0–46.0)
HEMOGLOBIN: 12 g/dL (ref 12.0–15.0)
MCH: 28.8 pg (ref 26.0–34.0)
MCHC: 31.1 g/dL (ref 30.0–36.0)
MCV: 92.8 fL (ref 80.0–100.0)
NRBC: 0 % (ref 0.0–0.2)
Platelets: 143 10*3/uL — ABNORMAL LOW (ref 150–400)
RBC: 4.16 MIL/uL (ref 3.87–5.11)
RDW: 14.6 % (ref 11.5–15.5)
WBC: 6.2 10*3/uL (ref 4.0–10.5)

## 2018-04-25 LAB — GLUCOSE, CAPILLARY
GLUCOSE-CAPILLARY: 103 mg/dL — AB (ref 70–99)
GLUCOSE-CAPILLARY: 128 mg/dL — AB (ref 70–99)
Glucose-Capillary: 97 mg/dL (ref 70–99)

## 2018-04-25 LAB — VITAMIN B12: VITAMIN B 12: 123 pg/mL — AB (ref 180–914)

## 2018-04-25 MED ORDER — ALPRAZOLAM 0.5 MG PO TABS: 0.5000 mg | ORAL_TABLET | Freq: Two times a day (BID) | ORAL | 0 refills | Status: DC | PRN

## 2018-04-25 MED ORDER — TECHNETIUM TC 99M TETROFOSMIN IV KIT
30.0000 | PACK | Freq: Once | INTRAVENOUS | Status: AC | PRN
  Administered 2018-04-25: 30 via INTRAVENOUS

## 2018-04-25 MED ORDER — OMEPRAZOLE 20 MG PO CPDR: 20.0000 mg | DELAYED_RELEASE_CAPSULE | Freq: Two times a day (BID) | ORAL | 0 refills | Status: DC

## 2018-04-25 MED ORDER — TECHNETIUM TC 99M TETROFOSMIN IV KIT
30.0000 | PACK | Freq: Once | INTRAVENOUS | Status: AC | PRN
  Administered 2018-04-24: 30 via INTRAVENOUS

## 2018-04-25 MED ORDER — ZINC OXIDE 12.8 % EX OINT
TOPICAL_OINTMENT | CUTANEOUS | Status: DC | PRN
  Filled 2018-04-25: qty 56.7

## 2018-04-25 MED ORDER — CYANOCOBALAMIN 1000 MCG/ML IJ SOLN
1000.0000 ug | Freq: Once | INTRAMUSCULAR | Status: AC
  Administered 2018-04-25: 1000 ug via INTRAMUSCULAR
  Filled 2018-04-25: qty 1

## 2018-04-25 NOTE — Progress Notes (Addendum)
Progress Note  Patient Name: Tamara Morales Date of Encounter: 04/25/2018  Primary Cardiologist: Minus Breeding, MD   Subjective   No recurrence of chest pain but she reports some SOB. Denies palpitations.   Inpatient Medications    Scheduled Meds: . aspirin EC  81 mg Oral Daily  . furosemide  60 mg Oral Daily  . heparin  5,000 Units Subcutaneous Q8H  . insulin aspart  0-5 Units Subcutaneous QHS  . insulin aspart  0-9 Units Subcutaneous TID WC  . levothyroxine  125 mcg Oral QAC breakfast  . losartan  100 mg Oral Daily  . mouth rinse  15 mL Mouth Rinse BID  . metolazone  2.5 mg Oral Daily  . pantoprazole  40 mg Oral BID  . potassium chloride SA  60 mEq Oral BID  . sertraline  75 mg Oral Daily   Continuous Infusions:  PRN Meds: acetaminophen, ALPRAZolam, nitroGLYCERIN, ondansetron (ZOFRAN) IV   Vital Signs    Vitals:   04/24/18 2353 04/25/18 0317 04/25/18 0532 04/25/18 0825  BP: (!) 97/58  (!) 100/57   Pulse: 88  75 77  Resp: $Remo'18  16 18  'DBxkV$ Temp: 98.6 F (37 C)  97.7 F (36.5 C)   TempSrc: Oral  Oral   SpO2: 94%  95% 96%  Weight:  130.7 kg    Height:       No intake or output data in the 24 hours ending 04/25/18 0904 Filed Weights   04/23/18 1056 04/24/18 0129 04/25/18 0317  Weight: 127 kg 130.7 kg 130.7 kg    Telemetry    Sinus rhythm with occasional PVCs - Personally Reviewed   Physical Exam   GEN: Laying in bed sleeping in no acute distress. Arouses easily Neck: No JVD, no carotid bruits Cardiac: RRR, no murmurs, rubs, or gallops.  Respiratory: Clear to auscultation bilaterally, no wheezes/ rales/ rhonchi GI: NABS, Soft, obese, nontender, non-distended  MS: 1+ edema; No deformity. Neuro:  Nonfocal, moving all extremities spontaneously Psych: Normal affect   Labs    Chemistry Recent Labs  Lab 04/23/18 1519 04/24/18 0916 04/25/18 0328  NA 141 141 140  K 4.1 3.1* 3.8  CL 102 100 100  CO2 30 32 32  GLUCOSE 161* 103* 91  BUN $Re'19 13 14    'gey$ CREATININE 1.14* 0.94 1.01*  CALCIUM 8.2* 7.9* 7.6*  PROT 6.6  --   --   ALBUMIN 3.0*  --   --   AST 20  --   --   ALT 12  --   --   ALKPHOS 84  --   --   BILITOT 0.7  --   --   GFRNONAA 43* 54* 49*  GFRAA 49* >60 57*  ANIONGAP $RemoveB'9 9 8     'WCBJebju$ Hematology Recent Labs  Lab 04/23/18 1519 04/25/18 0328  WBC 7.1 6.2  RBC 4.43 4.16  HGB 12.6 12.0  HCT 42.3 38.6  MCV 95.5 92.8  MCH 28.4 28.8  MCHC 29.8* 31.1  RDW 15.3 14.6  PLT 141* 143*    Cardiac Enzymes Recent Labs  Lab 04/23/18 1519 04/23/18 2120 04/24/18 0333 04/24/18 0916  TROPONINI <0.03 <0.03 <0.03 <0.03   No results for input(s): TROPIPOC in the last 168 hours.   BNP Recent Labs  Lab 04/23/18 1519  BNP 97.3     DDimer No results for input(s): DDIMER in the last 168 hours.   Radiology    Dg Chest 2 View  Result Date: 04/23/2018 CLINICAL  DATA:  Shortness of breath and chest pain. EXAM: CHEST - 2 VIEW COMPARISON:  Chest x-rays dated 03/14/2018 and 01/12/2017 and chest CT dated 07/16/2017 FINDINGS: Heart size and pulmonary vascularity are normal. No infiltrates or effusions. Minimal linear atelectasis at the left lung base. No acute bone abnormality. Old posterior right rib fractures. Surgical clips in the right breast and right axilla. IMPRESSION: No active cardiopulmonary disease. Electronically Signed   By: Lorriane Shire M.D.   On: 04/23/2018 11:56   Ct Angio Chest Pe W/cm &/or Wo Cm  Result Date: 04/23/2018 CLINICAL DATA:  Shortness of breath EXAM: CT ANGIOGRAPHY CHEST WITH CONTRAST TECHNIQUE: Multidetector CT imaging of the chest was performed using the standard protocol during bolus administration of intravenous contrast. Multiplanar CT image reconstructions and MIPs were obtained to evaluate the vascular anatomy. CONTRAST:  167mL ISOVUE-370 IOPAMIDOL (ISOVUE-370) INJECTION 76% COMPARISON:  Chest x-ray 04/23/2018, CT chest 07/16/2017 FINDINGS: Cardiovascular: Satisfactory opacification of the pulmonary  arteries to the segmental level. No evidence of pulmonary embolism. Tortuous aorta. Moderate aortic atherosclerosis. No dissection. Normal heart size. No pericardial effusion. Mediastinum/Nodes: History of thyroidectomy. No mediastinal suspicious nodes. Esophagus within normal limits. Trachea midline Lungs/Pleura: No acute airspace disease, pleural effusion or pneumothorax Upper Abdomen: No acute abnormality. Musculoskeletal: Scoliosis and degenerative changes of the spine. No acute or suspicious abnormality. Postsurgical changes of the right breast corresponding to history of lumpectomy Review of the MIP images confirms the above findings. IMPRESSION: 1. Negative for acute pulmonary embolus or aortic dissection. 2. Clear lung fields. Aortic Atherosclerosis (ICD10-I70.0). Electronically Signed   By: Donavan Foil M.D.   On: 04/23/2018 19:01    Cardiac Studies   Echocardiogram 02/2018: Study Conclusions  - Left ventricle: The cavity size was normal. Wall thickness was   normal. Systolic function was vigorous. The estimated ejection   fraction was in the range of 65% to 70%. Wall motion was normal;   there were no regional wall motion abnormalities. Doppler   parameters are consistent with abnormal left ventricular   relaxation (grade 1 diastolic dysfunction). - Mitral valve: Calcified annulus. There was mild regurgitation. - Pulmonary arteries: Systolic pressure was mildly increased.  Impressions:  - Normal LV systolic function; mild diastolic dysfunction; mild MR   and TR; mild pulmonary hypertension.  Patient Profile     84 y.o. female with a PMH of HTN, HLD, Chronic diastolic CHF, DM type 2, hypothyroidism, OSA on CPAP, and CKD stage 3, who is being followed by cardiology for the evaluation of chest pain   Assessment & Plan    1. Chest pain: patient presented with central chest tightness/burning pain intermittently over the past couple weeks. EKG without ischemic changes. Trop neg x3.  BNP wnl. CTA Chest with clear lungs, no PE. She will undergo day two of NST today to further evaluate for ischemic etiology - If NST without ischemia, no further cardiac work-up necessarly  2. Chronic diastolic CHF: BNP wnl. CXR/ CTA Chest with clear lungs. She does not appear volume overloaded on exam - Continue home lasix, metolazone, and potassium supplement  3. HTN: BP stable - Continue home losartan  4. DM type 2: last A1C 6.6 02/2018; at goal of <7 - Continue management per primary team  5. Obesity hypoventilation syndrome: follows with Pulm outpatient.  Possible this is contributing to her symptoms.  - Continue management per primary team   6. OSA: compliant with CPAP - Continue CPAP   For questions or updates, please contact [place] Please  consult [email or web address] for contact info under Cardiology/STEMI.      Signed, Abigail Butts, PA-C  04/25/2018, 9:04 AM   [phone]  Patient examined chart reviewed Obese white female distant heart sounds clear lungs soft abdomen palpable DP bilaterally for 2nd part of myovue today R/O atypical pain not likely cardiac in nature BP well controlled With ARB continue CPAP and low carb diet   Jenkins Rouge

## 2018-04-25 NOTE — Progress Notes (Signed)
Pt complaining of having a hard time breathing this morning.  Says nose is stuffy.  Assessed-- pulse=77, O2=97%.  Intervened with humidified O2.  Pt says stuffiness feels better, not so hard to breath.  Will reassess again in 30 minutes.

## 2018-04-25 NOTE — Discharge Summary (Signed)
Physician Discharge Summary  Tamara Morales NLZ:[id number] DOB: [birth year] DOA: 04/23/2018  PCP: Blanchie Serve, MD  Admit date: 04/23/2018 Discharge date: 04/25/2018  Admitted From: ILF Discharge disposition: ILF   Recommendations for Outpatient Follow-Up:   1. Needs B12 supplementation-- has numbness/tingling in fingers-- give IM dose x 1 on 11/7 2. Increased PPI 3. Resume home health 4. Patient ran out of xanax-- have sent 5 days until patient can get in touch with PCP  Discharge Diagnosis:   Principal Problem:   Chest pain Active Problems:   Hypothyroidism (acquired)   Essential hypertension   CKD (chronic kidney disease) stage 3, GFR 30-59 ml/min (HCC)   Diabetes mellitus type 2 in obese (HCC)   Anxiety   Chronic respiratory failure with hypoxia (HCC)   Pressure injury of skin    Discharge Condition: Improved.  Diet recommendation: Low sodium, heart healthy.  Carbohydrate-modified  Wound care: None.  Code status: Full.   History of Present Illness:   Tamara Morales is a 84 y.o. female with medical history significant for OSA on CPAP, chronic hypoxic respiratory failure, chronic diastolic CHF, CKD 3, anxiety disorder, and hypothyroidism, now presenting to the emergency department for evaluation of chest discomfort and dyspnea.  Patient reports waking this morning with pain in her chest that she describes as a "tightness" and "burning."  Discomfort is localized to the central chest and associated with mild dyspnea and mild nausea without vomiting.  She was at rest when symptoms developed and have slowly eased off without any specific intervention.  She reports similar symptoms occasionally over the past couple years, but is concerned that it is becoming more frequent and more intense.  She denies any fevers, chills, headache, abdominal pain, or dysuria.  Reports that her chronic lower extremity swelling has been stable.  She reports undergoing cardiac  catheterization approximately 20 years ago and was told that there was no abnormalities found.  She had an echocardiogram in September with preserved EF and normal wall motion.   Hospital Course by Problem:   1. Chest pain: patient presented with central chest tightness/burning pain intermittently over the past couple weeks. EKG without ischemic changes. Trop neg x3. BNP wnl. CTA Chest with clear lungs, no PE -2 day stress test reviewed with cardiology: no intervention needed 2. Chronic diastolic CHF: BNP wnl. CXR/ CTA Chest with clear lungs. She does not appear volume overloaded on exam - Continue home meds and follow low salt diet as best she can  3. HTN:  - Continue home losartan  4. DM type 2: last A1C 6.6 02/2018 -continue home meds  5. Obesity hypoventilation syndrome:follows with Pulm outpatient. Possible this is contributing to her symptoms.  - encourage weight loss  6. TKW:IOXBDZHGD with CPAP - Continue CPAP    Medical Consultants:   cardiology   Discharge Exam:   Vitals:   04/25/18 0532 04/25/18 0825  BP: (!) 100/57   Pulse: 75 77  Resp: 16 18  Temp: 97.7 F (36.5 C)   SpO2: 95% 96%   Vitals:   04/24/18 2353 04/25/18 0317 04/25/18 0532 04/25/18 0825  BP: (!) 97/58  (!) 100/57   Pulse: 88  75 77  Resp: $Remo'18  16 18  'DmuQC$ Temp: 98.6 F (37 C)  97.7 F (36.5 C)   TempSrc: Oral  Oral   SpO2: 94%  95% 96%  Weight:  130.7 kg    Height:        General exam: Appears  calm and comfortable.    The results of significant diagnostics from this hospitalization (including imaging, microbiology, ancillary and laboratory) are listed below for reference.     Procedures and Diagnostic Studies:   Dg Chest 2 View  Result Date: 04/23/2018 CLINICAL DATA:  Shortness of breath and chest pain. EXAM: CHEST - 2 VIEW COMPARISON:  Chest x-rays dated 03/14/2018 and 01/12/2017 and chest CT dated 07/16/2017 FINDINGS: Heart size and pulmonary vascularity are normal. No  infiltrates or effusions. Minimal linear atelectasis at the left lung base. No acute bone abnormality. Old posterior right rib fractures. Surgical clips in the right breast and right axilla. IMPRESSION: No active cardiopulmonary disease. Electronically Signed   By: Lorriane Shire M.D.   On: 04/23/2018 11:56   Ct Angio Chest Pe W/cm &/or Wo Cm  Result Date: 04/23/2018 CLINICAL DATA:  Shortness of breath EXAM: CT ANGIOGRAPHY CHEST WITH CONTRAST TECHNIQUE: Multidetector CT imaging of the chest was performed using the standard protocol during bolus administration of intravenous contrast. Multiplanar CT image reconstructions and MIPs were obtained to evaluate the vascular anatomy. CONTRAST:  127mL ISOVUE-370 IOPAMIDOL (ISOVUE-370) INJECTION 76% COMPARISON:  Chest x-ray 04/23/2018, CT chest 07/16/2017 FINDINGS: Cardiovascular: Satisfactory opacification of the pulmonary arteries to the segmental level. No evidence of pulmonary embolism. Tortuous aorta. Moderate aortic atherosclerosis. No dissection. Normal heart size. No pericardial effusion. Mediastinum/Nodes: History of thyroidectomy. No mediastinal suspicious nodes. Esophagus within normal limits. Trachea midline Lungs/Pleura: No acute airspace disease, pleural effusion or pneumothorax Upper Abdomen: No acute abnormality. Musculoskeletal: Scoliosis and degenerative changes of the spine. No acute or suspicious abnormality. Postsurgical changes of the right breast corresponding to history of lumpectomy Review of the MIP images confirms the above findings. IMPRESSION: 1. Negative for acute pulmonary embolus or aortic dissection. 2. Clear lung fields. Aortic Atherosclerosis (ICD10-I70.0). Electronically Signed   By: Donavan Foil M.D.   On: 04/23/2018 19:01     Labs:   Basic Metabolic Panel: Recent Labs  Lab 04/23/18 1519 04/24/18 0916 04/25/18 0328  NA 141 141 140  K 4.1 3.1* 3.8  CL 102 100 100  CO2 30 32 32  GLUCOSE 161* 103* 91  BUN $Re'19 13 14    'stt$ CREATININE 1.14* 0.94 1.01*  CALCIUM 8.2* 7.9* 7.6*   GFR Estimated Creatinine Clearance: 53.8 mL/min (A) (by C-G formula based on SCr of 1.01 mg/dL (H)). Liver Function Tests: Recent Labs  Lab 04/23/18 1519  AST 20  ALT 12  ALKPHOS 84  BILITOT 0.7  PROT 6.6  ALBUMIN 3.0*   No results for input(s): LIPASE, AMYLASE in the last 168 hours. No results for input(s): AMMONIA in the last 168 hours. Coagulation profile No results for input(s): INR, PROTIME in the last 168 hours.  CBC: Recent Labs  Lab 04/23/18 1519 04/25/18 0328  WBC 7.1 6.2  NEUTROABS 5.4  --   HGB 12.6 12.0  HCT 42.3 38.6  MCV 95.5 92.8  PLT 141* 143*   Cardiac Enzymes: Recent Labs  Lab 04/23/18 1519 04/23/18 2120 04/24/18 0333 04/24/18 0916  TROPONINI <0.03 <0.03 <0.03 <0.03   BNP: Invalid input(s): POCBNP CBG: Recent Labs  Lab 04/24/18 1217 04/24/18 1807 04/24/18 2116 04/25/18 0648 04/25/18 1211  GLUCAP 142* 148* 104* 103* 97   D-Dimer No results for input(s): DDIMER in the last 72 hours. Hgb A1c No results for input(s): HGBA1C in the last 72 hours. Lipid Profile No results for input(s): CHOL, HDL, LDLCALC, TRIG, CHOLHDL, LDLDIRECT in the last 72 hours. Thyroid function studies No  results for input(s): TSH, T4TOTAL, T3FREE, THYROIDAB in the last 72 hours.  Invalid input(s): FREET3 Anemia work up Recent Labs    04/25/18 0328  [id number] 123*   Microbiology No results found for this or any previous visit (from the past 240 hour(s)).   Discharge Instructions:   Discharge Instructions    Diet - low sodium heart healthy   Complete by:  As directed    Increase activity slowly   Complete by:  As directed      Allergies as of 04/25/2018      Reactions   Azithromycin Itching   Beta Adrenergic Blockers Other (See Comments)   Depression   Ciprofloxacin Other (See Comments)   Edgy and very jumpy    Codeine Nausea And Vomiting   Darvon [propoxyphene] Nausea And Vomiting    Epinephrine Other (See Comments)   Rapid pulse, sweats   Klonopin [clonazepam] Other (See Comments)   Makes her feel very suicidal    Oxycodone Other (See Comments)   Patient felt like it altered her mental status "Crazy" . Hallucinations later as well.   Paxil [paroxetine Hydrochloride] Other (See Comments)   Severe depression    Prednisone Other (See Comments)   "makes me feel really bad"   Propoxyphene Hcl Nausea And Vomiting   Tape Itching   Torsemide Other (See Comments)   Patient stated that she doesn't recall the reaction   Tramadol Other (See Comments)   Feels "jittery"   Vicodin [hydrocodone-acetaminophen] Other (See Comments)   Hallucinations   Meloxicam Diarrhea   Vancomycin Rash   Localized rash related to infusion rate      Medication List    TAKE these medications   acetaminophen 325 MG tablet Commonly known as:  TYLENOL Take 650 mg by mouth every 8 (eight) hours as needed for mild pain or moderate pain.   ALPRAZolam 0.5 MG tablet Commonly known as:  XANAX Take 1 tablet (0.5 mg total) by mouth 2 (two) times daily as needed for anxiety. What changed:  See the new instructions.   aspirin 81 MG tablet Take 81 mg by mouth daily.   furosemide 40 MG tablet Commonly known as:  LASIX Take 1.5 tablets (60 mg total) by mouth daily. Along with metolazone and potassium supplement   levothyroxine 125 MCG tablet Commonly known as:  SYNTHROID, LEVOTHROID Take 1 tablet (125 mcg total) by mouth daily.   loratadine 10 MG tablet Commonly known as:  CLARITIN Take 10 mg by mouth daily as needed for allergies.   losartan 50 MG tablet Commonly known as:  COZAAR TAKE 2 TABLETS ONCE A DAY. HOLD FOR SYSTOLIC BLOOD PRESSURE LESS [id number]. What changed:  See the new instructions.   metolazone 2.5 MG tablet Commonly known as:  ZAROXOLYN Take 1 tablet (2.5 mg total) by mouth daily.   omeprazole 20 MG capsule Commonly known as:  PRILOSEC Take 1 capsule (20 mg total) by  mouth 2 (two) times daily before a meal. What changed:  See the new instructions.   ondansetron 4 MG tablet Commonly known as:  ZOFRAN TAKE (1) TABLET BY MOUTH EVERY SIX HOURS AS NEEDED FOR NAUSEA. What changed:  See the new instructions.   potassium chloride SA 20 MEQ tablet Commonly known as:  K-DUR,KLOR-CON Take 60 mEq by mouth 2 (two) times daily.   psyllium 0.52 g capsule Commonly known as:  REGULOID Take 1.04 g by mouth daily as needed (for mild constipation).   sertraline 50 MG tablet Commonly known as:  ZOLOFT Take 1 tablet (50 mg total) by mouth daily. With 25 mg daily for total of 75 mg daily What changed:  additional instructions      Follow-up Information    Lendon Colonel, NP Follow up on 05/02/2018.   Specialties:  Nurse Practitioner, Radiology, Cardiology Why:  Please arrive 15 minutes early for your 9:30am post-hospital cardiology follow-up appointment Contact information: [address] [place] [address] [phone]        Blanchie Serve, MD .   Specialty:  Internal Medicine Contact information: [place] [address] [phone]        Minus Breeding, MD .   Specialty:  Cardiology Contact information: [address] [place] [address] [phone]            Time coordinating discharge: 25 min  Signed:  Geradine Girt DO  [facility] 04/25/2018, 4:20 PM

## 2018-04-25 NOTE — Progress Notes (Signed)
Patient requesting medicine for itching under belly.

## 2018-04-25 NOTE — Progress Notes (Signed)
Applied zinc oxide.  Pt says belly feels better.

## 2018-04-25 NOTE — Progress Notes (Signed)
PT Cancellation Note  Patient Details Name: Tamara Morales MRN: [id number] DOB: 1931-01-24   Cancelled Treatment:    Reason Eval/Treat Not Completed: Patient at procedure or test/unavailable attempted to work with patient, however she was out of the room. Will attempt to try back if time/schedule allow.   Deniece Ree PT, DPT, CBIS  Supplemental Physical Therapist [facility]    Pager [phone] Acute Rehab Office [phone]

## 2018-04-25 NOTE — Progress Notes (Signed)
   Dr. Johnsie Cancel personally reviewed the patient's NST and felt that there was no ischemia and felt that scarring was unlikely given EF of 71%. No further work-up indicated at this time. Patient is cleared for discharge home from a cardiology standpoint and follow-up has been arranged.   Abigail Butts, PA-C 04/25/18

## 2018-04-25 NOTE — Care Management Note (Signed)
Case Management Note  Patient Details  Name: Tamara Morales MRN: [id number] Date of Birth: 1930-12-22  Subjective/Objective:  84yo female presented with CP.                 Action/Plan: CM met with patient to discuss transitional needs. Patient lives at [facility] and utilizes a Theatre manager and 2 [place] for mobility. Patient reports having grab bars and a bidet toilet with elevated side rails; independent with ADLs, with meals provided at the facility. Patient verbalized [name] Friends have an Brewing technologist with PT/OT serviced by [employer]. PCP verified as: [place], [facility]. Patient indicated her daughter would provide transportation home. No further needs from CM.  Expected Discharge Date:                  Expected Discharge Plan:  Home/Self Care(Independent Living)  In-House Referral:  NA  Discharge planning Services  CM Consult  Post Acute Care Choice:  NA Choice offered to:  NA  DME Arranged:  N/A DME Agency:  NA  HH Arranged:  NA HH Agency:  NA  Status of Service:  Completed, signed off  If discussed at [place] of Stay Meetings, dates discussed:    Additional Comments:  Midge Minium RN, BSN, NCM-BC, ACM-RN [phone] 04/25/2018, 2:14 PM

## 2018-04-25 NOTE — Evaluation (Signed)
Physical Therapy Evaluation Patient Details Name: Tamara Morales MRN: [id number] DOB: Jan 09, 1931 Today's Date: 04/25/2018   History of Present Illness  84yo female presenting to the ED with chest discomfort and dyspnea, increased frequency of these symptoms. Incomplete R BBB on EKG, CT negative for PE. PMH breast CA, knee pain, HTN, hypothyroidism, migrane, obesity, O2 dependence, B TKR   Clinical Impression   Patient received in bed, very pleasant and willing to participate in PT session. She is able to perform all functional bed mobility with S, transfers and gait approximately 41fx2 into bathroom with RW and min guard. SpO2 on room air 88-89%, however remained in the 90s on 2LPM O2. Able to clean legs and assist with changing of gown while sitting on toilet without B UE support, required totalA for pericare following BM but reports she has bidet at home. She was left up in the chair with all needs met, MD present and attending, RN aware of patient status.     Follow Up Recommendations Other (comment)(PT at friends home )    Equipment Recommendations  None recommended by PT(has all necessary DME )    Recommendations for Other Services       Precautions / Restrictions Precautions Precautions: Fall;Other (comment) Precaution Comments: watch SpO2  Restrictions Weight Bearing Restrictions: No      Mobility  Bed Mobility Overal bed mobility: Needs Assistance Bed Mobility: Supine to Sit     Supine to sit: Supervision     General bed mobility comments: S for safety, no physical assist given   Transfers Overall transfer level: Needs assistance Equipment used: Rolling walker (2 wheeled) Transfers: Sit to/from Stand Sit to Stand: Min guard         General transfer comment: min guard for safety, cues for safety and seqeuncing, no physical assist given   Ambulation/Gait Ambulation/Gait assistance: Min guard Gait Distance (Feet): 15 Feet(x2) Assistive device: Rolling  walker (2 wheeled) Gait Pattern/deviations: Step-through pattern;Decreased step length - right;Decreased step length - left;Decreased stride length;Trunk flexed Gait velocity: decreased    General Gait Details: gait limited at baseline, able to ambulate short distances in room with RW and min guard for safety, SpO2 in 90s on 2LPM O2   Stairs            Wheelchair Mobility    Modified Rankin (Stroke Patients Only)       Balance Overall balance assessment: Needs assistance Sitting-balance support: Bilateral upper extremity supported;Feet supported Sitting balance-Leahy Scale: Fair     Standing balance support: Bilateral upper extremity supported;During functional activity Standing balance-Leahy Scale: Poor Standing balance comment: heavy reliance on B UE support                              Pertinent Vitals/Pain Pain Assessment: No/denies pain    Home Living Family/patient expects to be discharged to:: Other (Comment)(friends home )                 Additional Comments: lives at friends home     Prior Function Level of Independence: Independent with assistive device(s)         Comments: using power chair primarily. walks very short distances with a RW when necessary     Hand Dominance        Extremity/Trunk Assessment   Upper Extremity Assessment Upper Extremity Assessment: Generalized weakness    Lower Extremity Assessment Lower Extremity Assessment: Generalized weakness    Cervical /  Trunk Assessment Cervical / Trunk Assessment: Kyphotic  Communication   Communication: No difficulties  Cognition Arousal/Alertness: Awake/alert Behavior During Therapy: WFL for tasks assessed/performed Overall Cognitive Status: Within Functional Limits for tasks assessed                                        General Comments      Exercises     Assessment/Plan    PT Assessment Patient needs continued PT services  PT  Problem List Decreased strength;Obesity;Decreased activity tolerance;Decreased safety awareness;Decreased balance;Decreased mobility;Decreased coordination       PT Treatment Interventions DME instruction;Balance training;Gait training;Neuromuscular re-education;Stair training;Functional mobility training;Patient/family education;Therapeutic activities;Therapeutic exercise;Manual techniques    PT Goals (Current goals can be found in the Care Plan section)  Acute Rehab PT Goals Patient Stated Goal: go home  PT Goal Formulation: With patient Time For Goal Achievement: 05/09/18 Potential to Achieve Goals: Good    Frequency Min 3X/week   Barriers to discharge        Co-evaluation               AM-PAC PT "6 Clicks" Daily Activity  Outcome Measure Difficulty turning over in bed (including adjusting bedclothes, sheets and blankets)?: A Little Difficulty moving from lying on back to sitting on the side of the bed? : A Little Difficulty sitting down on and standing up from a chair with arms (e.g., wheelchair, bedside commode, etc,.)?: A Little Help needed moving to and from a bed to chair (including a wheelchair)?: A Little Help needed walking in hospital room?: A Little Help needed climbing 3-5 steps with a railing? : A Lot 6 Click Score: 17    End of Session Equipment Utilized During Treatment: Oxygen Activity Tolerance: Patient tolerated treatment well Patient left: in chair;with call bell/phone within reach;Other (comment)(MD present and attending ) Nurse Communication: Mobility status PT Visit Diagnosis: Unsteadiness on feet (R26.81);Difficulty in walking, not elsewhere classified (R26.2);Muscle weakness (generalized) (M62.81)    Time: 1520-1600 PT Time Calculation (min) (ACUTE ONLY): 40 min   Charges:   PT Evaluation $PT Eval Low Complexity: 1 Low PT Treatments $Gait Training: 8-22 mins $Therapeutic Activity: 8-22 mins       Deniece Ree PT, DPT,  CBIS  Supplemental Physical Therapist [place]    Pager [phone] Acute Rehab Office [phone]

## 2018-04-26 DIAGNOSIS — I5089 Other heart failure: Secondary | ICD-10-CM | POA: Diagnosis not present

## 2018-04-26 DIAGNOSIS — M545 Low back pain: Secondary | ICD-10-CM | POA: Diagnosis not present

## 2018-04-26 DIAGNOSIS — N393 Stress incontinence (female) (male): Secondary | ICD-10-CM | POA: Diagnosis not present

## 2018-04-26 DIAGNOSIS — M6281 Muscle weakness (generalized): Secondary | ICD-10-CM | POA: Diagnosis not present

## 2018-04-26 DIAGNOSIS — M542 Cervicalgia: Secondary | ICD-10-CM | POA: Diagnosis not present

## 2018-04-26 DIAGNOSIS — R2681 Unsteadiness on feet: Secondary | ICD-10-CM | POA: Diagnosis not present

## 2018-04-29 ENCOUNTER — Other Ambulatory Visit: Payer: Self-pay | Admitting: Nurse Practitioner

## 2018-04-29 DIAGNOSIS — I5089 Other heart failure: Secondary | ICD-10-CM | POA: Diagnosis not present

## 2018-04-29 DIAGNOSIS — N393 Stress incontinence (female) (male): Secondary | ICD-10-CM | POA: Diagnosis not present

## 2018-04-29 DIAGNOSIS — M542 Cervicalgia: Secondary | ICD-10-CM | POA: Diagnosis not present

## 2018-04-29 DIAGNOSIS — M545 Low back pain: Secondary | ICD-10-CM | POA: Diagnosis not present

## 2018-04-29 DIAGNOSIS — R2681 Unsteadiness on feet: Secondary | ICD-10-CM | POA: Diagnosis not present

## 2018-04-29 DIAGNOSIS — M6281 Muscle weakness (generalized): Secondary | ICD-10-CM | POA: Diagnosis not present

## 2018-05-01 DIAGNOSIS — M542 Cervicalgia: Secondary | ICD-10-CM | POA: Diagnosis not present

## 2018-05-01 DIAGNOSIS — N393 Stress incontinence (female) (male): Secondary | ICD-10-CM | POA: Diagnosis not present

## 2018-05-01 DIAGNOSIS — I5089 Other heart failure: Secondary | ICD-10-CM | POA: Diagnosis not present

## 2018-05-01 DIAGNOSIS — M545 Low back pain: Secondary | ICD-10-CM | POA: Diagnosis not present

## 2018-05-01 DIAGNOSIS — M6281 Muscle weakness (generalized): Secondary | ICD-10-CM | POA: Diagnosis not present

## 2018-05-01 DIAGNOSIS — R2681 Unsteadiness on feet: Secondary | ICD-10-CM | POA: Diagnosis not present

## 2018-05-02 ENCOUNTER — Ambulatory Visit: Payer: Medicare Other | Admitting: Adult Health

## 2018-05-06 ENCOUNTER — Emergency Department (HOSPITAL_COMMUNITY): Payer: Medicare Other

## 2018-05-06 ENCOUNTER — Emergency Department (HOSPITAL_COMMUNITY)
Admission: EM | Admit: 2018-05-06 | Discharge: 2018-05-06 | Disposition: A | Discharge status: Home / Self Care | Payer: Medicare Other | Attending: Emergency Medicine | Admitting: Emergency Medicine

## 2018-05-06 ENCOUNTER — Encounter (HOSPITAL_COMMUNITY): Payer: Self-pay

## 2018-05-06 ENCOUNTER — Other Ambulatory Visit: Payer: Self-pay

## 2018-05-06 DIAGNOSIS — T7840XA Allergy, unspecified, initial encounter: Secondary | ICD-10-CM | POA: Diagnosis not present

## 2018-05-06 DIAGNOSIS — R279 Unspecified lack of coordination: Secondary | ICD-10-CM | POA: Diagnosis not present

## 2018-05-06 DIAGNOSIS — E876 Hypokalemia: Secondary | ICD-10-CM | POA: Diagnosis not present

## 2018-05-06 DIAGNOSIS — Y69 Unspecified misadventure during surgical and medical care: Secondary | ICD-10-CM | POA: Insufficient documentation

## 2018-05-06 DIAGNOSIS — M6281 Muscle weakness (generalized): Secondary | ICD-10-CM | POA: Diagnosis not present

## 2018-05-06 DIAGNOSIS — R0902 Hypoxemia: Secondary | ICD-10-CM | POA: Diagnosis not present

## 2018-05-06 DIAGNOSIS — R0602 Shortness of breath: Secondary | ICD-10-CM | POA: Diagnosis not present

## 2018-05-06 DIAGNOSIS — T502X5A Adverse effect of carbonic-anhydrase inhibitors, benzothiadiazides and other diuretics, initial encounter: Secondary | ICD-10-CM | POA: Insufficient documentation

## 2018-05-06 DIAGNOSIS — R531 Weakness: Secondary | ICD-10-CM | POA: Diagnosis not present

## 2018-05-06 DIAGNOSIS — Z853 Personal history of malignant neoplasm of breast: Secondary | ICD-10-CM | POA: Diagnosis not present

## 2018-05-06 DIAGNOSIS — N183 Chronic kidney disease, stage 3 (moderate): Secondary | ICD-10-CM | POA: Insufficient documentation

## 2018-05-06 DIAGNOSIS — R2681 Unsteadiness on feet: Secondary | ICD-10-CM | POA: Diagnosis not present

## 2018-05-06 DIAGNOSIS — R5381 Other malaise: Secondary | ICD-10-CM | POA: Diagnosis not present

## 2018-05-06 DIAGNOSIS — E039 Hypothyroidism, unspecified: Secondary | ICD-10-CM | POA: Diagnosis not present

## 2018-05-06 DIAGNOSIS — M545 Low back pain: Secondary | ICD-10-CM | POA: Diagnosis not present

## 2018-05-06 DIAGNOSIS — I5032 Chronic diastolic (congestive) heart failure: Secondary | ICD-10-CM | POA: Diagnosis not present

## 2018-05-06 DIAGNOSIS — I5089 Other heart failure: Secondary | ICD-10-CM | POA: Diagnosis not present

## 2018-05-06 DIAGNOSIS — R000 Tachycardia, unspecified: Secondary | ICD-10-CM | POA: Diagnosis not present

## 2018-05-06 DIAGNOSIS — Z96652 Presence of left artificial knee joint: Secondary | ICD-10-CM | POA: Insufficient documentation

## 2018-05-06 DIAGNOSIS — M542 Cervicalgia: Secondary | ICD-10-CM | POA: Diagnosis not present

## 2018-05-06 DIAGNOSIS — Z87891 Personal history of nicotine dependence: Secondary | ICD-10-CM | POA: Insufficient documentation

## 2018-05-06 DIAGNOSIS — T887XXA Unspecified adverse effect of drug or medicament, initial encounter: Secondary | ICD-10-CM | POA: Insufficient documentation

## 2018-05-06 DIAGNOSIS — I130 Hypertensive heart and chronic kidney disease with heart failure and stage 1 through stage 4 chronic kidney disease, or unspecified chronic kidney disease: Secondary | ICD-10-CM | POA: Diagnosis not present

## 2018-05-06 DIAGNOSIS — T424X5A Adverse effect of benzodiazepines, initial encounter: Secondary | ICD-10-CM | POA: Diagnosis not present

## 2018-05-06 DIAGNOSIS — N393 Stress incontinence (female) (male): Secondary | ICD-10-CM | POA: Diagnosis not present

## 2018-05-06 DIAGNOSIS — Z96651 Presence of right artificial knee joint: Secondary | ICD-10-CM | POA: Insufficient documentation

## 2018-05-06 DIAGNOSIS — R0600 Dyspnea, unspecified: Secondary | ICD-10-CM | POA: Diagnosis present

## 2018-05-06 DIAGNOSIS — T50905A Adverse effect of unspecified drugs, medicaments and biological substances, initial encounter: Secondary | ICD-10-CM | POA: Diagnosis not present

## 2018-05-06 DIAGNOSIS — Z743 Need for continuous supervision: Secondary | ICD-10-CM | POA: Diagnosis not present

## 2018-05-06 LAB — CBC WITH DIFFERENTIAL/PLATELET
Abs Immature Granulocytes: 0.02 10*3/uL (ref 0.00–0.07)
Basophils Absolute: 0.1 10*3/uL (ref 0.0–0.1)
Basophils Relative: 1 %
EOS ABS: 0.2 10*3/uL (ref 0.0–0.5)
EOS PCT: 3 %
HCT: 40.2 % (ref 36.0–46.0)
Hemoglobin: 12.5 g/dL (ref 12.0–15.0)
Immature Granulocytes: 0 %
Lymphocytes Relative: 14 %
Lymphs Abs: 1 10*3/uL (ref 0.7–4.0)
MCH: 29.3 pg (ref 26.0–34.0)
MCHC: 31.1 g/dL (ref 30.0–36.0)
MCV: 94.4 fL (ref 80.0–100.0)
MONO ABS: 0.6 10*3/uL (ref 0.1–1.0)
Monocytes Relative: 8 %
Neutro Abs: 5.4 10*3/uL (ref 1.7–7.7)
Neutrophils Relative %: 74 %
PLATELETS: 123 10*3/uL — AB (ref 150–400)
RBC: 4.26 MIL/uL (ref 3.87–5.11)
RDW: 15 % (ref 11.5–15.5)
WBC: 7.3 10*3/uL (ref 4.0–10.5)
nRBC: 0 % (ref 0.0–0.2)

## 2018-05-06 LAB — BASIC METABOLIC PANEL
Anion gap: 9 (ref 5–15)
BUN: 20 mg/dL (ref 8–23)
CALCIUM: 7.9 mg/dL — AB (ref 8.9–10.3)
CO2: 34 mmol/L — ABNORMAL HIGH (ref 22–32)
CREATININE: 1.05 mg/dL — AB (ref 0.44–1.00)
Chloride: 97 mmol/L — ABNORMAL LOW (ref 98–111)
GFR calc Af Amer: 55 mL/min — ABNORMAL LOW (ref >60)
GFR calc non Af Amer: 47 mL/min — ABNORMAL LOW (ref >60)
GLUCOSE: 115 mg/dL — AB (ref 70–99)
POTASSIUM: 3 mmol/L — AB (ref 3.5–5.1)
Sodium: 140 mmol/L (ref 135–145)

## 2018-05-06 LAB — I-STAT TROPONIN, ED: Troponin i, poc: 0 ng/mL (ref 0.00–0.08)

## 2018-05-06 MED ORDER — POTASSIUM CHLORIDE CRYS ER 20 MEQ PO TBCR
40.0000 meq | EXTENDED_RELEASE_TABLET | Freq: Once | ORAL | Status: AC
  Administered 2018-05-06: 40 meq via ORAL
  Filled 2018-05-06: qty 2

## 2018-05-06 NOTE — ED Notes (Signed)
Spoke to [place]. Patient will be going home via [facility].

## 2018-05-06 NOTE — ED Triage Notes (Addendum)
Per EMS, patient coming home complaining of difficulty breathing due to dry mouth. She is on 3L of O2 all of the time due to history of CHF.   She has been taking Xanax for anxiety and she took a little bit more than normal today/tonight. She is supposed to take half a pill, but she took an entire pill tonight and "believes she is having a reaction"  Patient states that she feels weak but she is starting to feel more normal than before.

## 2018-05-06 NOTE — ED Notes (Signed)
[facility] has been called  

## 2018-05-06 NOTE — ED Notes (Signed)
Pt refusing hospital gown

## 2018-05-06 NOTE — ED Provider Notes (Signed)
[facility] DEPT Provider Note   CSN: [id number] Arrival date & time: 05/06/18  0536     History   Chief Complaint Chief Complaint  Patient presents with  . Weakness    HPI Tamara Morales is a 84 y.o. female.  The history is provided by the patient.  She has history of hypertension, hyperlipidemia, diabetes, depression, sleep apnea and comes in stating she thinks she is having a reaction to alprazolam.  She normally takes half of a 0.5 mg alprazolam tablet for anxiety.  Tonight she, she was having difficulty sleeping and had heard that alprazolam may help for sleep, so she took a whole tablet.  About 2 hours ago, she started having dry mouth and stuffy nose and some difficulty breathing.  She denied chest pain, heaviness, tightness, pressure.  She denied nausea or vomiting.  She denied diaphoresis.  Symptoms are improving without any treatment.  Past Medical History:  Diagnosis Date  . ARTHRITIS, KNEES, BILATERAL   . Breast CA ([place]) 2000   s/p R lumpectomy and XRT  . DEPRESSION   . DJD (degenerative joint disease) of knee   . GERD   . HYPERLIPIDEMIA   . HYPERTENSION   . HYPOTHYROIDISM    postsurgical  . Hypoxia   . MIGRAINE HEADACHE   . Morbid obesity ([place])   . OSA (obstructive sleep apnea) 01/17/2011 dx  . Oxygen dependent   . Urge incontinence   . UTI (lower urinary tract infection) 12/2014    Patient Active Problem List   Diagnosis Date Noted  . Pressure injury of skin 04/24/2018  . Anxiety 04/23/2018  . Chronic respiratory failure with hypoxia ([place]) 04/23/2018  . Acute on chronic diastolic (congestive) heart failure ([place]) 03/14/2018  . Diabetes mellitus type 2 in obese ([place]) 03/12/2018  . Lymphedema 11/20/2017  . Counseling regarding advanced care planning and goals of care 09/20/2017  . URI (upper respiratory infection) 08/30/2017  . Hypomagnesemia 07/17/2017  . Chest pain 07/16/2017  . Prediabetes 07/03/2017  .  Hyperlipidemia LDL goal <70 07/03/2017  . Upper airway cough syndrome 06/27/2017  . CKD (chronic kidney disease) stage 3, GFR 30-59 ml/min ([facility]) 05/22/2017  . Bilateral dry eyes 04/20/2017  . Hemorrhoids 04/09/2017  . Chronic acquired lymphedema 03/30/2017  . Chronic constipation 03/30/2017  . Weight gain 12/07/2016  . Neck pain on left side 10/05/2016  . Chronic left shoulder pain 08/10/2016  . Hematuria 08/10/2016  . Numbness and tingling of right leg 04/06/2016  . Contusion of right breast 04/06/2016  . IBS (irritable bowel syndrome) 11/18/2015  . Hypokalemia 07/07/2015  . Acid reflux 07/05/2015  . Lymphedema of both lower extremities 07/02/2015  . UTI (urinary tract infection) 07/02/2015  . Ureterolithiasis   . Ureteral stone with hydronephrosis 06/28/2015  . Right ureteral calculus 06/27/2015  . Depression with anxiety 06/27/2015  . History of breast cancer 06/27/2015  . Abdominal pain, RLQ (right lower quadrant) 06/27/2015  . Obesity hypoventilation syndrome ([place]) 11/27/2014  . Orthopnea 04/09/2013  . SOB (shortness of breath) 03/13/2013  . Other chest pain 11/30/2012  . Cough 03/05/2010  . Morbid obesity ([place]) 10/04/2009  . Hypothyroidism (acquired) 08/05/2009  . Essential hypertension 08/05/2009    Past Surgical History:  Procedure Laterality Date  . ABDOMINAL HYSTERECTOMY    . ADENOIDECTOMY    . BREAST BIOPSY  2000  . BREAST LUMPECTOMY  2000  . CATARACT EXTRACTION    . CHOLECYSTECTOMY  1995  . CYSTOSCOPY/URETEROSCOPY/HOLMIUM LASER Right 06/28/2015  Procedure: CYSTOSCOPY RIGHT RETROGRAD RIGHT URETEROSCOPY/HOLMIUM LASER WITH RIGHT STENT PLACEMENT;  Surgeon: Alexis Frock, MD;  Location: [facility];  Service: Urology;  Laterality: Right;  . PARATHYROIDECTOMY     2-3 removed  . REPLACEMENT TOTAL KNEE BILATERAL  1999, 2005  . Right leg femur repaired    . THYROIDECTOMY    . TONSILLECTOMY    . TUBAL LIGATION       OB History    Gravida  5   Para  5   Term        Preterm      AB      Living        SAB      TAB      Ectopic      Multiple      Live Births               Home Medications    Prior to Admission medications   Medication Sig Start Date End Date Taking? Authorizing Provider  acetaminophen (TYLENOL) 325 MG tablet Take 650 mg by mouth every 8 (eight) hours as needed for mild pain or moderate pain.     [provider]  ALPRAZolam Duanne Moron) 0.5 MG tablet Take 1 tablet (0.5 mg total) by mouth 2 (two) times daily as needed for anxiety. 04/25/18   Geradine Girt, DO  aspirin 81 MG tablet Take 81 mg by mouth daily.     [provider]  furosemide (LASIX) 40 MG tablet Take 1.5 tablets (60 mg total) by mouth daily. Along with metolazone and potassium supplement 11/20/17   Blanchie Serve, MD  levothyroxine (SYNTHROID, LEVOTHROID) 125 MCG tablet Take 1 tablet (125 mcg total) by mouth daily. 10/11/17   Blanchie Serve, MD  loratadine (CLARITIN) 10 MG tablet Take 10 mg by mouth daily as needed for allergies.     [provider]  losartan (COZAAR) 50 MG tablet TAKE 2 TABLETS ONCE A DAY. HOLD FOR SYSTOLIC BLOOD PRESSURE LESS [id number]. Patient taking differently: Take 100 mg by mouth See admin instructions. Take 100 mg by mouth once a day and HOLD IF SYSTOLIC READING IS <267 12/45/80   Mast, Man X, NP  metolazone (ZAROXOLYN) 2.5 MG tablet Take 1 tablet (2.5 mg total) by mouth daily. 03/27/17   Blanchie Serve, MD  omeprazole (PRILOSEC) 20 MG capsule Take 1 capsule (20 mg total) by mouth 2 (two) times daily before a meal. 04/25/18   Vann, Jessica U, DO  ondansetron (ZOFRAN) 4 MG tablet TAKE (1) TABLET BY MOUTH EVERY SIX HOURS AS NEEDED FOR NAUSEA. Patient taking differently: Take 4 mg by mouth every 6 (six) hours as needed for nausea.  03/26/18   Mast, Man X, NP  potassium chloride SA (K-DUR,KLOR-CON) 20 MEQ tablet Take 60 mEq by mouth 2 (two) times daily.    [provider]  potassium chloride SA (K-DUR,KLOR-CON) 20  MEQ tablet TAKE 3 TABLETS TWICE DAILY. 04/29/18   Lauree Chandler, NP  psyllium (REGULOID) 0.52 g capsule Take 1.04 g by mouth daily as needed (for mild constipation).     [provider]  sertraline (ZOLOFT) 50 MG tablet Take 1 tablet (50 mg total) by mouth daily. With 25 mg daily for total of 75 mg daily Patient taking differently: Take 50 mg by mouth daily.  12/18/17   Blanchie Serve, MD    Family History Family History  Problem Relation Age of Onset  . Emphysema Sister   . Coronary artery disease Neg  Hx     Social History Social History   Tobacco Use  . Smoking status: Former Smoker    Packs/day: 0.25    Years: 2.00    Pack years: 0.50    Types: Cigarettes    Last attempt to quit: 06/19/1974    Years since quitting: 43.9  . Smokeless tobacco: Never Used  . Tobacco comment: Quit in late 20's  Substance Use Topics  . Alcohol use: No    Alcohol/week: 0.0 standard drinks    Comment: rare  . Drug use: No     Allergies   Azithromycin; Beta adrenergic blockers; Ciprofloxacin; Codeine; Darvon [propoxyphene]; Epinephrine; Klonopin [clonazepam]; Oxycodone; Paxil [paroxetine hydrochloride]; Prednisone; Propoxyphene hcl; Tape; Torsemide; Tramadol; Vicodin [hydrocodone-acetaminophen]; Meloxicam; and Vancomycin   Review of Systems Review of Systems  All other systems reviewed and are negative.    Physical Exam Updated Vital Signs BP (!) 114/49 (BP Location: Left Arm)   Pulse 78   Temp (!) 97.5 F (36.4 C) (Oral)   Resp 10   Ht $R'5\' 3"'WU$  (1.6 m)   Wt 127 kg   SpO2 97%   BMI 49.60 kg/m   Physical Exam  Nursing note and vitals reviewed.  84 year old female, resting comfortably and in no acute distress. Vital signs are normal. Oxygen saturation is 97%, which is normal. Head is normocephalic and atraumatic. PERRLA, EOMI. Oropharynx is clear. Neck is nontender and supple without adenopathy or JVD. Back is nontender and there is no CVA tenderness. Lungs are clear  without rales, wheezes, or rhonchi. Chest is nontender. Heart has regular rate and rhythm without murmur. Abdomen is soft, flat, nontender without masses or hepatosplenomegaly and peristalsis is normoactive. Extremities have 1-2+ lymphedema, full range of motion is present. Skin is warm and dry without rash. Neurologic: Mental status is normal, cranial nerves are intact, there are no motor or sensory deficits.  ED Treatments / Results  Labs (all labs ordered are listed, but only abnormal results are displayed) Labs Reviewed  CBC WITH DIFFERENTIAL/PLATELET - Abnormal; Notable for the following components:      Result Value   Platelets 123 (*)    All other components within normal limits  BASIC METABOLIC PANEL - Abnormal; Notable for the following components:   Potassium 3.0 (*)    Chloride 97 (*)    CO2 34 (*)    Glucose, Bld 115 (*)    Creatinine, Ser 1.05 (*)    Calcium 7.9 (*)    GFR calc non Af Amer 47 (*)    GFR calc Af Amer 55 (*)    All other components within normal limits  I-STAT TROPONIN, ED    EKG EKG Interpretation  Date/Time:  Monday May 06 2018 06:10:44 EST Ventricular Rate:  73 PR Interval:    QRS Duration: 105 QT Interval:  393 QTC Calculation: 433 R Axis:   9 Text Interpretation:  Sinus rhythm Normal ECG When compared with ECG of 04/25/2018, No significant change was found Confirmed by Delora Fuel [id number]) on 05/06/2018 6:16:12 AM Also confirmed by Delora Fuel [id number]), editor Hattie Perch [id number])  on 05/06/2018 7:22:04 AM   Radiology Dg Chest 2 View  Result Date: 05/06/2018 CLINICAL DATA:  Increasing shortness of breath, difficulty breathing, and weakness tonight. History of CHF. EXAM: CHEST - 2 VIEW COMPARISON:  04/23/2018 FINDINGS: Heart size and pulmonary vascularity are normal. Lungs are clear. Surgical clips in the right axilla. Thoracic scoliosis convex towards the right. Degenerative changes in the spine. Calcification  of the aorta.  IMPRESSION: No evidence of active pulmonary disease. Electronically Signed   By: Lucienne Capers M.D.   On: 05/06/2018 06:59    Procedures Procedures   Medications Ordered in ED Medications  potassium chloride SA (K-DUR,KLOR-CON) CR tablet 40 mEq (has no administration in time range)     Initial Impression / Assessment and Plan / ED Course  I have reviewed the triage vital signs and the nursing notes.  Pertinent labs & imaging results that were available during my care of the patient were reviewed by me and considered in my medical decision making (see chart for details).  Episode of dry mouth, stuffiness, dyspnea of uncertain cause.  Doubt reaction to alprazolam.  Old records are reviewed, and she had recent hospitalizations for heart failure and chest pain.  Those symptoms were distantly different from what she experienced tonight.  Will check screening labs, ECG and observe in the ED.  Currently, she is resting comfortably and I suspect that what ever had triggered her event has passed or resolved.  ECG shows no acute changes, chest x-ray is unremarkable.  Labs are significant for hypokalemia which is presumably related to her furosemide.  She is on potassium supplements at home.  She is given an additional dose of potassium in the emergency department.  Patient was reevaluated and she is resting comfortably.  She states that all of her symptoms have resolved.  She is felt to be safe for discharge.  Advised to discuss with primary care provider alternative medications to assist in the sleep, advised to not take more alprazolam than she normally takes.  Return precautions discussed.  Final Clinical Impressions(s) / ED Diagnoses   Final diagnoses:  Medication side effect, initial encounter  Diuretic-induced hypokalemia    ED Discharge Orders    None       Delora Fuel, MD 20/94/70 [phone]

## 2018-05-06 NOTE — Discharge Instructions (Addendum)
Do not take more than half of a Xanax tablet at a time, do not use it to help you sleep. If you need something to help you sleep, discuss that with your primary care provider.  MAke sure to take your potassium supplements at home.

## 2018-05-06 NOTE — ED Notes (Signed)
Bed: XY72 Expected date:  Expected time:  Means of arrival:  Comments: 84 yo F/ [facility] shortness of breath

## 2018-05-06 NOTE — ED Notes (Signed)
Patient transported to X-ray 

## 2018-05-08 ENCOUNTER — Other Ambulatory Visit: Payer: Self-pay | Admitting: *Deleted

## 2018-05-08 DIAGNOSIS — E538 Deficiency of other specified B group vitamins: Secondary | ICD-10-CM

## 2018-05-08 DIAGNOSIS — I5089 Other heart failure: Secondary | ICD-10-CM | POA: Diagnosis not present

## 2018-05-08 DIAGNOSIS — M542 Cervicalgia: Secondary | ICD-10-CM | POA: Diagnosis not present

## 2018-05-08 DIAGNOSIS — I10 Essential (primary) hypertension: Secondary | ICD-10-CM

## 2018-05-08 DIAGNOSIS — R2681 Unsteadiness on feet: Secondary | ICD-10-CM | POA: Diagnosis not present

## 2018-05-08 DIAGNOSIS — N393 Stress incontinence (female) (male): Secondary | ICD-10-CM | POA: Diagnosis not present

## 2018-05-08 DIAGNOSIS — M545 Low back pain: Secondary | ICD-10-CM | POA: Diagnosis not present

## 2018-05-08 DIAGNOSIS — M6281 Muscle weakness (generalized): Secondary | ICD-10-CM | POA: Diagnosis not present

## 2018-05-09 DIAGNOSIS — R2681 Unsteadiness on feet: Secondary | ICD-10-CM | POA: Diagnosis not present

## 2018-05-09 DIAGNOSIS — M6281 Muscle weakness (generalized): Secondary | ICD-10-CM | POA: Diagnosis not present

## 2018-05-09 DIAGNOSIS — M542 Cervicalgia: Secondary | ICD-10-CM | POA: Diagnosis not present

## 2018-05-09 DIAGNOSIS — N393 Stress incontinence (female) (male): Secondary | ICD-10-CM | POA: Diagnosis not present

## 2018-05-09 DIAGNOSIS — M545 Low back pain: Secondary | ICD-10-CM | POA: Diagnosis not present

## 2018-05-09 DIAGNOSIS — I5089 Other heart failure: Secondary | ICD-10-CM | POA: Diagnosis not present

## 2018-05-10 DIAGNOSIS — M545 Low back pain: Secondary | ICD-10-CM | POA: Diagnosis not present

## 2018-05-10 DIAGNOSIS — I5089 Other heart failure: Secondary | ICD-10-CM | POA: Diagnosis not present

## 2018-05-10 DIAGNOSIS — M542 Cervicalgia: Secondary | ICD-10-CM | POA: Diagnosis not present

## 2018-05-10 DIAGNOSIS — M6281 Muscle weakness (generalized): Secondary | ICD-10-CM | POA: Diagnosis not present

## 2018-05-10 DIAGNOSIS — R2681 Unsteadiness on feet: Secondary | ICD-10-CM | POA: Diagnosis not present

## 2018-05-10 DIAGNOSIS — N393 Stress incontinence (female) (male): Secondary | ICD-10-CM | POA: Diagnosis not present

## 2018-05-12 DIAGNOSIS — N393 Stress incontinence (female) (male): Secondary | ICD-10-CM | POA: Diagnosis not present

## 2018-05-12 DIAGNOSIS — R2681 Unsteadiness on feet: Secondary | ICD-10-CM | POA: Diagnosis not present

## 2018-05-12 DIAGNOSIS — M542 Cervicalgia: Secondary | ICD-10-CM | POA: Diagnosis not present

## 2018-05-12 DIAGNOSIS — I5089 Other heart failure: Secondary | ICD-10-CM | POA: Diagnosis not present

## 2018-05-12 DIAGNOSIS — M6281 Muscle weakness (generalized): Secondary | ICD-10-CM | POA: Diagnosis not present

## 2018-05-12 DIAGNOSIS — M545 Low back pain: Secondary | ICD-10-CM | POA: Diagnosis not present

## 2018-05-13 DIAGNOSIS — I5089 Other heart failure: Secondary | ICD-10-CM | POA: Diagnosis not present

## 2018-05-13 DIAGNOSIS — M545 Low back pain: Secondary | ICD-10-CM | POA: Diagnosis not present

## 2018-05-13 DIAGNOSIS — M6281 Muscle weakness (generalized): Secondary | ICD-10-CM | POA: Diagnosis not present

## 2018-05-13 DIAGNOSIS — N393 Stress incontinence (female) (male): Secondary | ICD-10-CM | POA: Diagnosis not present

## 2018-05-13 DIAGNOSIS — R2681 Unsteadiness on feet: Secondary | ICD-10-CM | POA: Diagnosis not present

## 2018-05-13 DIAGNOSIS — M542 Cervicalgia: Secondary | ICD-10-CM | POA: Diagnosis not present

## 2018-05-14 ENCOUNTER — Telehealth: Payer: Self-pay | Admitting: *Deleted

## 2018-05-14 NOTE — Telephone Encounter (Signed)
Spoke with patient regarding her next appointments, she wanted to make sure she was scheduled for her lab work. We went over the dates , so she could write them down. Patient will be discussing her B-12 level and her need for Xanax and Zofran. I explained that she will be better prepared once these labs are done and we have some number in front of her. She agreed and said she will be at the lab appointment.

## 2018-05-15 DIAGNOSIS — M542 Cervicalgia: Secondary | ICD-10-CM | POA: Diagnosis not present

## 2018-05-15 DIAGNOSIS — N393 Stress incontinence (female) (male): Secondary | ICD-10-CM | POA: Diagnosis not present

## 2018-05-15 DIAGNOSIS — I5089 Other heart failure: Secondary | ICD-10-CM | POA: Diagnosis not present

## 2018-05-15 DIAGNOSIS — M545 Low back pain: Secondary | ICD-10-CM | POA: Diagnosis not present

## 2018-05-15 DIAGNOSIS — R2681 Unsteadiness on feet: Secondary | ICD-10-CM | POA: Diagnosis not present

## 2018-05-15 DIAGNOSIS — M6281 Muscle weakness (generalized): Secondary | ICD-10-CM | POA: Diagnosis not present

## 2018-05-17 DIAGNOSIS — R2681 Unsteadiness on feet: Secondary | ICD-10-CM | POA: Diagnosis not present

## 2018-05-17 DIAGNOSIS — M542 Cervicalgia: Secondary | ICD-10-CM | POA: Diagnosis not present

## 2018-05-17 DIAGNOSIS — M6281 Muscle weakness (generalized): Secondary | ICD-10-CM | POA: Diagnosis not present

## 2018-05-17 DIAGNOSIS — M545 Low back pain: Secondary | ICD-10-CM | POA: Diagnosis not present

## 2018-05-17 DIAGNOSIS — N393 Stress incontinence (female) (male): Secondary | ICD-10-CM | POA: Diagnosis not present

## 2018-05-17 DIAGNOSIS — I5089 Other heart failure: Secondary | ICD-10-CM | POA: Diagnosis not present

## 2018-05-20 ENCOUNTER — Other Ambulatory Visit: Payer: Self-pay | Admitting: *Deleted

## 2018-05-20 DIAGNOSIS — E039 Hypothyroidism, unspecified: Secondary | ICD-10-CM

## 2018-05-20 DIAGNOSIS — N393 Stress incontinence (female) (male): Secondary | ICD-10-CM | POA: Diagnosis not present

## 2018-05-20 DIAGNOSIS — R2681 Unsteadiness on feet: Secondary | ICD-10-CM | POA: Diagnosis not present

## 2018-05-20 DIAGNOSIS — E876 Hypokalemia: Secondary | ICD-10-CM

## 2018-05-20 DIAGNOSIS — M542 Cervicalgia: Secondary | ICD-10-CM | POA: Diagnosis not present

## 2018-05-20 DIAGNOSIS — M6281 Muscle weakness (generalized): Secondary | ICD-10-CM | POA: Diagnosis not present

## 2018-05-20 DIAGNOSIS — I10 Essential (primary) hypertension: Secondary | ICD-10-CM | POA: Diagnosis not present

## 2018-05-20 DIAGNOSIS — E669 Obesity, unspecified: Secondary | ICD-10-CM

## 2018-05-20 DIAGNOSIS — M545 Low back pain: Secondary | ICD-10-CM | POA: Diagnosis not present

## 2018-05-20 DIAGNOSIS — E1169 Type 2 diabetes mellitus with other specified complication: Secondary | ICD-10-CM | POA: Diagnosis not present

## 2018-05-20 DIAGNOSIS — M25552 Pain in left hip: Secondary | ICD-10-CM | POA: Diagnosis not present

## 2018-05-20 DIAGNOSIS — E538 Deficiency of other specified B group vitamins: Secondary | ICD-10-CM

## 2018-05-20 DIAGNOSIS — I5089 Other heart failure: Secondary | ICD-10-CM | POA: Diagnosis not present

## 2018-05-21 ENCOUNTER — Other Ambulatory Visit: Payer: Medicare Other

## 2018-05-22 DIAGNOSIS — I5089 Other heart failure: Secondary | ICD-10-CM | POA: Diagnosis not present

## 2018-05-22 DIAGNOSIS — M545 Low back pain: Secondary | ICD-10-CM | POA: Diagnosis not present

## 2018-05-22 DIAGNOSIS — M6281 Muscle weakness (generalized): Secondary | ICD-10-CM | POA: Diagnosis not present

## 2018-05-22 DIAGNOSIS — M25552 Pain in left hip: Secondary | ICD-10-CM | POA: Diagnosis not present

## 2018-05-22 DIAGNOSIS — R2681 Unsteadiness on feet: Secondary | ICD-10-CM | POA: Diagnosis not present

## 2018-05-22 DIAGNOSIS — N393 Stress incontinence (female) (male): Secondary | ICD-10-CM | POA: Diagnosis not present

## 2018-05-22 LAB — TSH: TSH: 9.66 mIU/L — ABNORMAL HIGH (ref 0.40–4.50)

## 2018-05-22 LAB — COMPLETE METABOLIC PANEL WITH GFR
AG RATIO: 1.4 (calc) (ref 1.0–2.5)
ALBUMIN MSPROF: 3.7 g/dL (ref 3.6–5.1)
ALT: 11 U/L (ref 6–29)
AST: 16 U/L (ref 10–35)
Alkaline phosphatase (APISO): 92 U/L (ref 33–130)
BILIRUBIN TOTAL: 0.5 mg/dL (ref 0.2–1.2)
BUN / CREAT RATIO: 23 (calc) — AB (ref 6–22)
BUN: 21 mg/dL (ref 7–25)
CHLORIDE: 100 mmol/L (ref 98–110)
CO2: 31 mmol/L (ref 20–32)
Calcium: 8.1 mg/dL — ABNORMAL LOW (ref 8.6–10.4)
Creat: 0.92 mg/dL — ABNORMAL HIGH (ref 0.60–0.88)
GFR, Est African American: 66 mL/min/{1.73_m2} (ref >=60)
GFR, Est Non African American: 57 mL/min/{1.73_m2} — ABNORMAL LOW (ref >=60)
Globulin: 2.7 g/dL (calc) (ref 1.9–3.7)
Glucose, Bld: 114 mg/dL — ABNORMAL HIGH (ref 65–99)
POTASSIUM: 3.6 mmol/L (ref 3.5–5.3)
SODIUM: 139 mmol/L (ref 135–146)
Total Protein: 6.4 g/dL (ref 6.1–8.1)

## 2018-05-22 LAB — CBC
HEMATOCRIT: 38.8 % (ref 35.0–45.0)
HEMOGLOBIN: 12.9 g/dL (ref 11.7–15.5)
MCH: 29.2 pg (ref 27.0–33.0)
MCHC: 33.2 g/dL (ref 32.0–36.0)
MCV: 87.8 fL (ref 80.0–100.0)
MPV: 13.1 fL — ABNORMAL HIGH (ref 7.5–12.5)
Platelets: 143 10*3/uL (ref 140–400)
RBC: 4.42 10*6/uL (ref 3.80–5.10)
RDW: 14.9 % (ref 11.0–15.0)
WBC: 6.5 10*3/uL (ref 3.8–10.8)

## 2018-05-22 LAB — VITAMIN B12: Vitamin B-12: 280 pg/mL (ref 200–1100)

## 2018-05-22 LAB — HEMOGLOBIN A1C
EAG (MMOL/L): 7.7 (calc)
HEMOGLOBIN A1C: 6.5 %{Hb} — AB (ref <5.7)
MEAN PLASMA GLUCOSE: 140 (calc)

## 2018-05-22 LAB — MAGNESIUM: Magnesium: 1.6 mg/dL (ref 1.5–2.5)

## 2018-05-23 ENCOUNTER — Other Ambulatory Visit: Payer: Self-pay | Admitting: Nurse Practitioner

## 2018-05-23 ENCOUNTER — Non-Acute Institutional Stay: Payer: Medicare Other | Admitting: Nurse Practitioner

## 2018-05-23 ENCOUNTER — Encounter: Payer: Self-pay | Admitting: Nurse Practitioner

## 2018-05-23 DIAGNOSIS — I5033 Acute on chronic diastolic (congestive) heart failure: Secondary | ICD-10-CM

## 2018-05-23 DIAGNOSIS — F419 Anxiety disorder, unspecified: Secondary | ICD-10-CM | POA: Diagnosis not present

## 2018-05-23 DIAGNOSIS — E538 Deficiency of other specified B group vitamins: Secondary | ICD-10-CM

## 2018-05-23 DIAGNOSIS — E039 Hypothyroidism, unspecified: Secondary | ICD-10-CM | POA: Diagnosis not present

## 2018-05-23 DIAGNOSIS — K219 Gastro-esophageal reflux disease without esophagitis: Secondary | ICD-10-CM

## 2018-05-23 DIAGNOSIS — I10 Essential (primary) hypertension: Secondary | ICD-10-CM

## 2018-05-23 DIAGNOSIS — R7989 Other specified abnormal findings of blood chemistry: Secondary | ICD-10-CM

## 2018-05-23 MED ORDER — FUROSEMIDE 40 MG PO TABS: 40.0000 mg | ORAL_TABLET | Freq: Every day | ORAL | 11 refills | Status: DC

## 2018-05-23 MED ORDER — LEVOTHYROXINE SODIUM 150 MCG PO TABS: 150.0000 ug | ORAL_TABLET | Freq: Every day | ORAL | 11 refills | Status: DC

## 2018-05-23 MED ORDER — SERTRALINE HCL 50 MG PO TABS: 75.0000 mg | ORAL_TABLET | Freq: Every day | ORAL | 2 refills | Status: DC

## 2018-05-23 NOTE — Assessment & Plan Note (Signed)
Stable, continue Omeprazole 20mg  qd, prn Zofran

## 2018-05-23 NOTE — Progress Notes (Signed)
Location:   clinic [facility]   Place of Service:  Clinic (12) Provider: Marlana Latus NP  Code Status: DNR Goals of Care: [place] 05/06/2018  Does Patient Have a Medical Advance Directive? Yes  Type of Advance Directive Out of facility DNR (pink MOST or yellow form);[place];Living will  Does patient want to make changes to medical advance directive? No - Patient declined  Copy of [place] in Chart? No - copy requested  Would patient like information on creating a medical advance directive? -  Pre-existing out of facility DNR order (yellow form or pink MOST form) -     Chief Complaint  Patient presents with  . Medical Management of Chronic Issues    2 mo f/u-- medication questions re: Xanax    HPI: Patient is a 84 y.o. female seen today for 05/06/18 ED visit for difficulty of breathing, dry mouth, stuff nose after took Alprazolam 0.5 mg instead of 0.25mg  bid prn as she usually does. Her symptoms improved w/o treatment. She has history of CHF, stable, Furosemide 60mg  qd, Metolazone 2.5mg  qd, Kcl 37meq bid, chronic edema BLE remains no change. Blood pressure is controlled on Losartan 100mg  qd. Hypothyroidism, on Levothyroxine 157mcg qd, last TSH 9.66 05/20/18. Her mood is stable on Sertraline 50mg  qd. Insomnia, difficulty staying sleep.  GERD stable on Omeprazole 20mg  qd.   Past Medical History:  Diagnosis Date  . ARTHRITIS, KNEES, BILATERAL   . Breast CA ([place]) 2000   s/p R lumpectomy and XRT  . DEPRESSION   . DJD (degenerative joint disease) of knee   . GERD   . HYPERLIPIDEMIA   . HYPERTENSION   . HYPOTHYROIDISM    postsurgical  . Hypoxia   . MIGRAINE HEADACHE   . Morbid obesity ([place])   . OSA (obstructive sleep apnea) 01/17/2011 dx  . Oxygen dependent   . Urge incontinence   . UTI (lower urinary tract infection) 12/2014    Past Surgical History:  Procedure Laterality Date  . ABDOMINAL HYSTERECTOMY    . ADENOIDECTOMY    .  BREAST BIOPSY  2000  . BREAST LUMPECTOMY  2000  . CATARACT EXTRACTION    . CHOLECYSTECTOMY  1995  . CYSTOSCOPY/URETEROSCOPY/HOLMIUM LASER Right 06/28/2015   Procedure: CYSTOSCOPY RIGHT RETROGRAD RIGHT URETEROSCOPY/HOLMIUM LASER WITH RIGHT STENT PLACEMENT;  Surgeon: Alexis Frock, MD;  Location: [facility];  Service: Urology;  Laterality: Right;  . PARATHYROIDECTOMY     2-3 removed  . REPLACEMENT TOTAL KNEE BILATERAL  1999, 2005  . Right leg femur repaired    . THYROIDECTOMY    . TONSILLECTOMY    . TUBAL LIGATION      Allergies  Allergen Reactions  . Azithromycin Itching  . Beta Adrenergic Blockers Other (See Comments)    Depression   . Ciprofloxacin Other (See Comments)    Edgy and very jumpy    . Codeine Nausea And Vomiting  . Darvon [Propoxyphene] Nausea And Vomiting  . Epinephrine Other (See Comments)    Rapid pulse, sweats  . Klonopin [Clonazepam] Other (See Comments)    Makes her feel very suicidal   . Oxycodone Other (See Comments)    Patient felt like it altered her mental status "Crazy" . Hallucinations later as well.  [name] Paxil [Paroxetine Hydrochloride] Other (See Comments)    Severe depression   . Prednisone Other (See Comments)    "makes me feel really bad"  . Propoxyphene Hcl Nausea And Vomiting  . Tape Itching  .  Torsemide Other (See Comments)    Patient stated that she doesn't recall the reaction  . Tramadol Other (See Comments)    Feels "jittery"  . Vicodin [Hydrocodone-Acetaminophen] Other (See Comments)    Hallucinations  . Meloxicam Diarrhea  . Vancomycin Rash    Localized rash related to infusion rate    Allergies as of 05/23/2018      Reactions   Azithromycin Itching   Beta Adrenergic Blockers Other (See Comments)   Depression   Ciprofloxacin Other (See Comments)   Edgy and very jumpy    Codeine Nausea And Vomiting   Darvon [propoxyphene] Nausea And Vomiting   Epinephrine Other (See Comments)   Rapid pulse, sweats   Klonopin [clonazepam] Other  (See Comments)   Makes her feel very suicidal    Oxycodone Other (See Comments)   Patient felt like it altered her mental status "Crazy" . Hallucinations later as well.   Paxil [paroxetine Hydrochloride] Other (See Comments)   Severe depression    Prednisone Other (See Comments)   "makes me feel really bad"   Propoxyphene Hcl Nausea And Vomiting   Tape Itching   Torsemide Other (See Comments)   Patient stated that she doesn't recall the reaction   Tramadol Other (See Comments)   Feels "jittery"   Vicodin [hydrocodone-acetaminophen] Other (See Comments)   Hallucinations   Meloxicam Diarrhea   Vancomycin Rash   Localized rash related to infusion rate      Medication List        Accurate as of 05/23/18 11:59 PM. Always use your most recent med list.          acetaminophen 325 MG tablet Commonly known as:  TYLENOL Take 650 mg by mouth every 8 (eight) hours as needed for mild pain or moderate pain.   ALPRAZolam 0.5 MG tablet Commonly known as:  XANAX Take 1 tablet (0.5 mg total) by mouth 2 (two) times daily as needed for anxiety.   aspirin 81 MG tablet Take 81 mg by mouth daily.   furosemide 40 MG tablet Commonly known as:  LASIX Take 1.5 tablets (60 mg total) by mouth daily. Along with metolazone and potassium supplement   furosemide 40 MG tablet Commonly known as:  LASIX Take 1 tablet (40 mg total) by mouth daily.   levothyroxine 125 MCG tablet Commonly known as:  SYNTHROID, LEVOTHROID Take 1 tablet (125 mcg total) by mouth daily.   levothyroxine 150 MCG tablet Commonly known as:  SYNTHROID, LEVOTHROID Take 1 tablet (150 mcg total) by mouth daily.   loratadine 10 MG tablet Commonly known as:  CLARITIN Take 10 mg by mouth daily as needed for allergies.   losartan 50 MG tablet Commonly known as:  COZAAR TAKE 2 TABLETS ONCE A DAY. HOLD FOR SYSTOLIC BLOOD PRESSURE LESS [id number].   metolazone 2.5 MG tablet Commonly known as:  ZAROXOLYN Take 1 tablet (2.5 mg  total) by mouth daily.   omeprazole 20 MG capsule Commonly known as:  PRILOSEC Take 1 capsule (20 mg total) by mouth 2 (two) times daily before a meal.   ondansetron 4 MG tablet Commonly known as:  ZOFRAN TAKE (1) TABLET BY MOUTH EVERY SIX HOURS AS NEEDED FOR NAUSEA.   potassium chloride 10 MEQ tablet Commonly known as:  K-DUR,KLOR-CON Take 60 mEq by mouth 2 (two) times daily.   psyllium 0.52 g capsule Commonly known as:  REGULOID Take 1.04 g by mouth daily as needed (for mild constipation).   sertraline 50 MG tablet Commonly  known as:  ZOLOFT Take 1 tablet (50 mg total) by mouth daily. With 25 mg daily for total of 75 mg daily   sertraline 50 MG tablet Commonly known as:  ZOLOFT Take 1.5 tablets (75 mg total) by mouth daily.       Review of Systems:  Review of Systems  Constitutional: Positive for unexpected weight change. Negative for activity change, appetite change, chills, diaphoresis, fatigue and fever.  HENT: Positive for hearing loss. Negative for congestion and voice change.   Respiratory: Positive for shortness of breath. Negative for cough and wheezing.        DOE  Gastrointestinal: Negative for abdominal distention, abdominal pain, constipation, diarrhea, nausea and vomiting.  Genitourinary: Negative for difficulty urinating, dysuria and urgency.       Incontinent of urine  Musculoskeletal: Positive for arthralgias and gait problem.  Skin: Negative for color change and pallor.       lipodermatosclerosis changes BLE   Neurological: Negative for dizziness, speech difficulty, weakness and headaches.  Psychiatric/Behavioral: Positive for sleep disturbance. Negative for agitation, behavioral problems and hallucinations. The patient is nervous/anxious.        Trouble staying sleep    Health Maintenance  Topic Date Due  . FOOT EXAM  09/08/1940  . OPHTHALMOLOGY EXAM  09/08/1940  . DEXA SCAN  09/07/1997  . HEMOGLOBIN A1C  11/19/2018  . TETANUS/TDAP  10/05/2019    . INFLUENZA VACCINE  Completed  . PNA vac Low Risk Adult  Completed    Physical Exam: Vitals:   05/23/18 1410  BP: 110/72  Pulse: (!) 104  Resp: 20  Temp: 97.8 F (36.6 C)  TempSrc: Oral  SpO2: 93%  Weight: (!) 301 lb 3.2 oz (136.6 kg)  Height: $Remove'5\' 1"'YjfRsWk$  (1.549 m)   Body mass index is 56.91 kg/m. Physical Exam  Constitutional: She is oriented to person, place, and time. She appears well-developed and well-nourished.  HENT:  Head: Normocephalic and atraumatic.  Eyes: Pupils are equal, round, and reactive to light. EOM are normal.  Neck: Normal range of motion. Neck supple. No JVD present. No thyromegaly present.  Cardiovascular: Normal rate and regular rhythm.  No murmur heard. Pulmonary/Chest: Effort normal. She has no wheezes. She has rales.  Posterior bibasilar rales.   Abdominal: Soft. She exhibits no distension. There is no tenderness. There is no rebound and no guarding.  Musculoskeletal: She exhibits edema.  lymphedema BLE, chronic. W/c for mobility.   Neurological: She is alert and oriented to person, place, and time. No cranial nerve deficit. She exhibits normal muscle tone. Coordination normal.  Skin: Skin is warm and dry.  lipodermatosclerosis changes of BLE  Psychiatric: She has a normal mood and affect. Her behavior is normal. Judgment and thought content normal.    Labs reviewed: Basic Metabolic Panel: Recent Labs    12/11/17 0000  01/01/18 0715 01/10/18 0000 03/05/18 0745  04/25/18 0328 05/06/18 0613 05/20/18 0000  NA 141   < > 142 141 140   < > 140 140 139  K 3.2*   < > 4.0 3.7 3.3*   < > 3.8 3.0* 3.6  CL 98   < > 100 99 100   < > 100 97* 100  CO2 33*   < > 36* 33* 31   < > 32 34* 31  GLUCOSE 109*   < > 115* 86 112*   < > 91 115* 114*  BUN 22   < > $R'17 22 20   'ij$ < >  14 20 21   CREATININE 0.88   < > 0.86 0.89* 0.95*   < > 1.01* 1.05* 0.92*  CALCIUM 8.1*   < > 8.0* 8.1* 8.1*   < > 7.6* 7.9* 8.1*  MG  --   --  1.8 1.7  --   --   --   --  1.6  TSH 8.77*   --   --   --  6.51*  --   --   --  9.66*   < > = values in this interval not displayed.   Liver Function Tests: Recent Labs    03/14/18 1701 03/15/18 0540 04/23/18 1519 05/20/18 0000  AST 21 14* 20 16  ALT 15 13 12 11   ALKPHOS 90 74 84  --   BILITOT 0.6 0.4 0.7 0.5  PROT 6.8 6.1* 6.6 6.4  ALBUMIN 3.4* 3.0* 3.0*  --    Recent Labs    07/16/17 1109  LIPASE 22   No results for input(s): AMMONIA in the last 8760 hours. CBC: Recent Labs    03/15/18 0540 04/23/18 1519 04/25/18 0328 05/06/18 0613 05/20/18 0000  WBC 6.8 7.1 6.2 7.3 6.5  NEUTROABS 4.7 5.4  --  5.4  --   HGB 12.3 12.6 12.0 12.5 12.9  HCT 39.0 42.3 38.6 40.2 38.8  MCV 92.6 95.5 92.8 94.4 87.8  PLT 142* 141* 143* 123* 143   Lipid Panel: Recent Labs    06/26/17 0655 03/05/18 0745  CHOL 171 153  HDL 45* 45*  LDLCALC 104* 86  TRIG 128 127  CHOLHDL 3.8 3.4   Lab Results  Component Value Date   HGBA1C 6.5 (H) 05/20/2018    Procedures since last visit: Dg Chest 2 View  Result Date: 05/06/2018 CLINICAL DATA:  Increasing shortness of breath, difficulty breathing, and weakness tonight. History of CHF. EXAM: CHEST - 2 VIEW COMPARISON:  04/23/2018 FINDINGS: Heart size and pulmonary vascularity are normal. Lungs are clear. Surgical clips in the right axilla. Thoracic scoliosis convex towards the right. Degenerative changes in the spine. Calcification of the aorta. IMPRESSION: No evidence of active pulmonary disease. Electronically Signed   By: Lucienne Capers M.D.   On: 05/06/2018 06:59    Assessment/Plan Hypothyroidism (acquired) 05/20/18 TSH 9.66, increase Levothyroxine 184mcg po qd, TSH and free T4 8 weeks.   Essential hypertension Blood pressure is controlled, continue Losartan 100mg  qd.   Acute on chronic diastolic (congestive) heart failure (HCC) Clinically compensated, continue Furosemide 60mg  qd, Metolazone 2.5mg  qd.  Adding Furosemide 40mg  with Kcl 20 meq daily prn for weight gain #2-3Ibs/24  hours or #3-5Ibs/week.  Acid reflux Stable, continue Omeprazole 20mg  qd, prn Zofran  Anxiety Continue  prn Alprazolam use, may consider increase Sertraline 75mg   for better mood control.   Vitamin B12 deficiency 05/21/18 Vit B12 280, adding vit B12 1040mcg po daily, repeat Vit B12 level 3 months.      Labs/tests ordered:  05/20/18 TSH 9.66, increase Levothyroxine 1108mcg po qd, Vit B12,  TSH and free T4 8 weeks.   Next appt:  3 months.

## 2018-05-23 NOTE — Assessment & Plan Note (Signed)
05/20/18 TSH 9.66, increase Levothyroxine 143mcg po qd, TSH and free T4 8 weeks.

## 2018-05-23 NOTE — Assessment & Plan Note (Signed)
Blood pressure is controlled, continue Losartan $RemoveBeforeDEI'100mg'EEiceOuzQoXwQaox$  qd.

## 2018-05-23 NOTE — Patient Instructions (Addendum)
05/20/18 TSH 9.66, increase Levothyroxine 115mcg po qd, TSH and free T4 8 weeks. Increase Sertraline 75mg  qd. Adding Furosemide 40mg  with Kcl 20 meq daily prn for weight gain #2-3Ibs/24 hours or #3-5Ibs/week. Adding vit B12 1012mcg po daily, repeat Vit B12 level in 8 weeks.  F/u in clinic 3 months

## 2018-05-23 NOTE — Assessment & Plan Note (Addendum)
Clinically compensated, continue Furosemide 60mg  qd, Metolazone 2.5mg  qd.  Adding Furosemide 40mg  with Kcl 20 meq daily prn for weight gain #2-3Ibs/24 hours or #3-5Ibs/week.

## 2018-05-23 NOTE — Assessment & Plan Note (Addendum)
Continue  prn Alprazolam use, may consider increase Sertraline 75mg   for better mood control.

## 2018-05-23 NOTE — Assessment & Plan Note (Signed)
05/21/18 Vit B12 280, adding vit B12 1068mcg po daily, repeat Vit B12 level 3 months.

## 2018-05-24 ENCOUNTER — Other Ambulatory Visit: Payer: Self-pay | Admitting: *Deleted

## 2018-05-24 ENCOUNTER — Encounter: Payer: Self-pay | Admitting: Nurse Practitioner

## 2018-05-24 DIAGNOSIS — N393 Stress incontinence (female) (male): Secondary | ICD-10-CM | POA: Diagnosis not present

## 2018-05-24 DIAGNOSIS — M6281 Muscle weakness (generalized): Secondary | ICD-10-CM | POA: Diagnosis not present

## 2018-05-24 DIAGNOSIS — R2681 Unsteadiness on feet: Secondary | ICD-10-CM | POA: Diagnosis not present

## 2018-05-24 DIAGNOSIS — M545 Low back pain: Secondary | ICD-10-CM | POA: Diagnosis not present

## 2018-05-24 DIAGNOSIS — I5089 Other heart failure: Secondary | ICD-10-CM | POA: Diagnosis not present

## 2018-05-24 DIAGNOSIS — M25552 Pain in left hip: Secondary | ICD-10-CM | POA: Diagnosis not present

## 2018-05-24 MED ORDER — VITAMIN B-12 1000 MCG PO TABS: 1000.0000 ug | ORAL_TABLET | Freq: Every day | ORAL | 2 refills | Status: DC

## 2018-05-27 DIAGNOSIS — R2681 Unsteadiness on feet: Secondary | ICD-10-CM | POA: Diagnosis not present

## 2018-05-27 DIAGNOSIS — M6281 Muscle weakness (generalized): Secondary | ICD-10-CM | POA: Diagnosis not present

## 2018-05-27 DIAGNOSIS — I5089 Other heart failure: Secondary | ICD-10-CM | POA: Diagnosis not present

## 2018-05-27 DIAGNOSIS — N393 Stress incontinence (female) (male): Secondary | ICD-10-CM | POA: Diagnosis not present

## 2018-05-27 DIAGNOSIS — M25552 Pain in left hip: Secondary | ICD-10-CM | POA: Diagnosis not present

## 2018-05-27 DIAGNOSIS — M545 Low back pain: Secondary | ICD-10-CM | POA: Diagnosis not present

## 2018-05-27 NOTE — Telephone Encounter (Signed)
Please go over the patient instructions from the visit 05/23/18 with the patient. It answered her questions regarding Vit B12 dosage and Sertraline usage. Please correct and update her medication list as her stated in the message. It is okay to use band name Synthroid instead of generic made Levothyroxine.

## 2018-05-28 ENCOUNTER — Encounter: Payer: Self-pay | Admitting: *Deleted

## 2018-05-28 ENCOUNTER — Other Ambulatory Visit: Payer: Medicare Other

## 2018-05-29 DIAGNOSIS — R2681 Unsteadiness on feet: Secondary | ICD-10-CM | POA: Diagnosis not present

## 2018-05-29 DIAGNOSIS — M6281 Muscle weakness (generalized): Secondary | ICD-10-CM | POA: Diagnosis not present

## 2018-05-29 DIAGNOSIS — M25552 Pain in left hip: Secondary | ICD-10-CM | POA: Diagnosis not present

## 2018-05-29 DIAGNOSIS — N393 Stress incontinence (female) (male): Secondary | ICD-10-CM | POA: Diagnosis not present

## 2018-05-29 DIAGNOSIS — M545 Low back pain: Secondary | ICD-10-CM | POA: Diagnosis not present

## 2018-05-29 DIAGNOSIS — I5089 Other heart failure: Secondary | ICD-10-CM | POA: Diagnosis not present

## 2018-05-30 ENCOUNTER — Encounter: Payer: Self-pay | Admitting: Nurse Practitioner

## 2018-05-31 DIAGNOSIS — M6281 Muscle weakness (generalized): Secondary | ICD-10-CM | POA: Diagnosis not present

## 2018-05-31 DIAGNOSIS — I5089 Other heart failure: Secondary | ICD-10-CM | POA: Diagnosis not present

## 2018-05-31 DIAGNOSIS — M545 Low back pain: Secondary | ICD-10-CM | POA: Diagnosis not present

## 2018-05-31 DIAGNOSIS — N393 Stress incontinence (female) (male): Secondary | ICD-10-CM | POA: Diagnosis not present

## 2018-05-31 DIAGNOSIS — R2681 Unsteadiness on feet: Secondary | ICD-10-CM | POA: Diagnosis not present

## 2018-05-31 DIAGNOSIS — M25552 Pain in left hip: Secondary | ICD-10-CM | POA: Diagnosis not present

## 2018-06-03 ENCOUNTER — Other Ambulatory Visit: Payer: Self-pay | Admitting: Nurse Practitioner

## 2018-06-04 DIAGNOSIS — N393 Stress incontinence (female) (male): Secondary | ICD-10-CM | POA: Diagnosis not present

## 2018-06-04 DIAGNOSIS — R2681 Unsteadiness on feet: Secondary | ICD-10-CM | POA: Diagnosis not present

## 2018-06-04 DIAGNOSIS — I5089 Other heart failure: Secondary | ICD-10-CM | POA: Diagnosis not present

## 2018-06-04 DIAGNOSIS — M545 Low back pain: Secondary | ICD-10-CM | POA: Diagnosis not present

## 2018-06-04 DIAGNOSIS — M6281 Muscle weakness (generalized): Secondary | ICD-10-CM | POA: Diagnosis not present

## 2018-06-04 DIAGNOSIS — M25552 Pain in left hip: Secondary | ICD-10-CM | POA: Diagnosis not present

## 2018-06-11 DIAGNOSIS — Z87891 Personal history of nicotine dependence: Secondary | ICD-10-CM | POA: Diagnosis not present

## 2018-06-11 DIAGNOSIS — I110 Hypertensive heart disease with heart failure: Secondary | ICD-10-CM | POA: Diagnosis not present

## 2018-06-11 DIAGNOSIS — R0602 Shortness of breath: Secondary | ICD-10-CM | POA: Diagnosis not present

## 2018-06-11 DIAGNOSIS — N3091 Cystitis, unspecified with hematuria: Secondary | ICD-10-CM | POA: Diagnosis not present

## 2018-06-11 DIAGNOSIS — I509 Heart failure, unspecified: Secondary | ICD-10-CM | POA: Diagnosis not present

## 2018-06-11 DIAGNOSIS — R0600 Dyspnea, unspecified: Secondary | ICD-10-CM | POA: Diagnosis not present

## 2018-06-13 ENCOUNTER — Encounter: Payer: Self-pay | Admitting: Nurse Practitioner

## 2018-06-17 DIAGNOSIS — M6281 Muscle weakness (generalized): Secondary | ICD-10-CM | POA: Diagnosis not present

## 2018-06-17 DIAGNOSIS — R2681 Unsteadiness on feet: Secondary | ICD-10-CM | POA: Diagnosis not present

## 2018-06-17 DIAGNOSIS — M545 Low back pain: Secondary | ICD-10-CM | POA: Diagnosis not present

## 2018-06-17 DIAGNOSIS — M25552 Pain in left hip: Secondary | ICD-10-CM | POA: Diagnosis not present

## 2018-06-17 DIAGNOSIS — N393 Stress incontinence (female) (male): Secondary | ICD-10-CM | POA: Diagnosis not present

## 2018-06-17 DIAGNOSIS — I5089 Other heart failure: Secondary | ICD-10-CM | POA: Diagnosis not present

## 2018-06-18 DIAGNOSIS — N3946 Mixed incontinence: Secondary | ICD-10-CM | POA: Diagnosis not present

## 2018-06-18 DIAGNOSIS — N3000 Acute cystitis without hematuria: Secondary | ICD-10-CM | POA: Diagnosis not present

## 2018-06-21 DIAGNOSIS — R2681 Unsteadiness on feet: Secondary | ICD-10-CM | POA: Diagnosis not present

## 2018-06-21 DIAGNOSIS — R159 Full incontinence of feces: Secondary | ICD-10-CM | POA: Diagnosis not present

## 2018-06-21 DIAGNOSIS — M6281 Muscle weakness (generalized): Secondary | ICD-10-CM | POA: Diagnosis not present

## 2018-06-21 DIAGNOSIS — R1312 Dysphagia, oropharyngeal phase: Secondary | ICD-10-CM | POA: Diagnosis not present

## 2018-06-21 DIAGNOSIS — M545 Low back pain: Secondary | ICD-10-CM | POA: Diagnosis not present

## 2018-06-21 DIAGNOSIS — N3946 Mixed incontinence: Secondary | ICD-10-CM | POA: Diagnosis not present

## 2018-06-21 DIAGNOSIS — I5089 Other heart failure: Secondary | ICD-10-CM | POA: Diagnosis not present

## 2018-06-25 DIAGNOSIS — M6281 Muscle weakness (generalized): Secondary | ICD-10-CM | POA: Diagnosis not present

## 2018-06-25 DIAGNOSIS — R2681 Unsteadiness on feet: Secondary | ICD-10-CM | POA: Diagnosis not present

## 2018-06-25 DIAGNOSIS — N3946 Mixed incontinence: Secondary | ICD-10-CM | POA: Diagnosis not present

## 2018-06-25 DIAGNOSIS — I5089 Other heart failure: Secondary | ICD-10-CM | POA: Diagnosis not present

## 2018-06-25 DIAGNOSIS — M545 Low back pain: Secondary | ICD-10-CM | POA: Diagnosis not present

## 2018-06-25 DIAGNOSIS — R159 Full incontinence of feces: Secondary | ICD-10-CM | POA: Diagnosis not present

## 2018-06-26 DIAGNOSIS — M6281 Muscle weakness (generalized): Secondary | ICD-10-CM | POA: Diagnosis not present

## 2018-06-26 DIAGNOSIS — M545 Low back pain: Secondary | ICD-10-CM | POA: Diagnosis not present

## 2018-06-26 DIAGNOSIS — I5089 Other heart failure: Secondary | ICD-10-CM | POA: Diagnosis not present

## 2018-06-26 DIAGNOSIS — R2681 Unsteadiness on feet: Secondary | ICD-10-CM | POA: Diagnosis not present

## 2018-06-26 DIAGNOSIS — R159 Full incontinence of feces: Secondary | ICD-10-CM | POA: Diagnosis not present

## 2018-06-26 DIAGNOSIS — N3946 Mixed incontinence: Secondary | ICD-10-CM | POA: Diagnosis not present

## 2018-06-27 DIAGNOSIS — M545 Low back pain: Secondary | ICD-10-CM | POA: Diagnosis not present

## 2018-06-27 DIAGNOSIS — I5089 Other heart failure: Secondary | ICD-10-CM | POA: Diagnosis not present

## 2018-06-27 DIAGNOSIS — R159 Full incontinence of feces: Secondary | ICD-10-CM | POA: Diagnosis not present

## 2018-06-27 DIAGNOSIS — M6281 Muscle weakness (generalized): Secondary | ICD-10-CM | POA: Diagnosis not present

## 2018-06-27 DIAGNOSIS — N3946 Mixed incontinence: Secondary | ICD-10-CM | POA: Diagnosis not present

## 2018-06-27 DIAGNOSIS — R2681 Unsteadiness on feet: Secondary | ICD-10-CM | POA: Diagnosis not present

## 2018-06-30 ENCOUNTER — Other Ambulatory Visit: Payer: Self-pay | Admitting: Nurse Practitioner

## 2018-06-30 DIAGNOSIS — I890 Lymphedema, not elsewhere classified: Secondary | ICD-10-CM

## 2018-07-02 DIAGNOSIS — M6281 Muscle weakness (generalized): Secondary | ICD-10-CM | POA: Diagnosis not present

## 2018-07-02 DIAGNOSIS — R159 Full incontinence of feces: Secondary | ICD-10-CM | POA: Diagnosis not present

## 2018-07-02 DIAGNOSIS — R2681 Unsteadiness on feet: Secondary | ICD-10-CM | POA: Diagnosis not present

## 2018-07-02 DIAGNOSIS — I5089 Other heart failure: Secondary | ICD-10-CM | POA: Diagnosis not present

## 2018-07-02 DIAGNOSIS — M545 Low back pain: Secondary | ICD-10-CM | POA: Diagnosis not present

## 2018-07-02 DIAGNOSIS — N3946 Mixed incontinence: Secondary | ICD-10-CM | POA: Diagnosis not present

## 2018-07-04 DIAGNOSIS — I5089 Other heart failure: Secondary | ICD-10-CM | POA: Diagnosis not present

## 2018-07-04 DIAGNOSIS — R159 Full incontinence of feces: Secondary | ICD-10-CM | POA: Diagnosis not present

## 2018-07-04 DIAGNOSIS — N3946 Mixed incontinence: Secondary | ICD-10-CM | POA: Diagnosis not present

## 2018-07-04 DIAGNOSIS — R2681 Unsteadiness on feet: Secondary | ICD-10-CM | POA: Diagnosis not present

## 2018-07-04 DIAGNOSIS — M6281 Muscle weakness (generalized): Secondary | ICD-10-CM | POA: Diagnosis not present

## 2018-07-04 DIAGNOSIS — M545 Low back pain: Secondary | ICD-10-CM | POA: Diagnosis not present

## 2018-07-05 ENCOUNTER — Encounter (HOSPITAL_COMMUNITY): Payer: Self-pay

## 2018-07-05 ENCOUNTER — Emergency Department (HOSPITAL_COMMUNITY): Payer: Medicare Other

## 2018-07-05 ENCOUNTER — Emergency Department (HOSPITAL_COMMUNITY)
Admission: EM | Admit: 2018-07-05 | Discharge: 2018-07-05 | Disposition: A | Discharge status: Home / Self Care | Payer: Medicare Other | Attending: Emergency Medicine | Admitting: Emergency Medicine

## 2018-07-05 ENCOUNTER — Other Ambulatory Visit: Payer: Self-pay

## 2018-07-05 DIAGNOSIS — Z79899 Other long term (current) drug therapy: Secondary | ICD-10-CM | POA: Insufficient documentation

## 2018-07-05 DIAGNOSIS — R0602 Shortness of breath: Secondary | ICD-10-CM | POA: Diagnosis not present

## 2018-07-05 DIAGNOSIS — R05 Cough: Secondary | ICD-10-CM | POA: Diagnosis not present

## 2018-07-05 DIAGNOSIS — Z7982 Long term (current) use of aspirin: Secondary | ICD-10-CM | POA: Insufficient documentation

## 2018-07-05 DIAGNOSIS — E1122 Type 2 diabetes mellitus with diabetic chronic kidney disease: Secondary | ICD-10-CM | POA: Diagnosis not present

## 2018-07-05 DIAGNOSIS — R0789 Other chest pain: Secondary | ICD-10-CM | POA: Diagnosis not present

## 2018-07-05 DIAGNOSIS — E039 Hypothyroidism, unspecified: Secondary | ICD-10-CM | POA: Insufficient documentation

## 2018-07-05 DIAGNOSIS — I130 Hypertensive heart and chronic kidney disease with heart failure and stage 1 through stage 4 chronic kidney disease, or unspecified chronic kidney disease: Secondary | ICD-10-CM | POA: Diagnosis not present

## 2018-07-05 DIAGNOSIS — I5032 Chronic diastolic (congestive) heart failure: Secondary | ICD-10-CM | POA: Diagnosis not present

## 2018-07-05 DIAGNOSIS — N183 Chronic kidney disease, stage 3 (moderate): Secondary | ICD-10-CM | POA: Diagnosis not present

## 2018-07-05 DIAGNOSIS — I10 Essential (primary) hypertension: Secondary | ICD-10-CM | POA: Diagnosis not present

## 2018-07-05 DIAGNOSIS — Z87891 Personal history of nicotine dependence: Secondary | ICD-10-CM | POA: Diagnosis not present

## 2018-07-05 DIAGNOSIS — R079 Chest pain, unspecified: Secondary | ICD-10-CM | POA: Diagnosis not present

## 2018-07-05 LAB — CBC WITH DIFFERENTIAL/PLATELET
Abs Immature Granulocytes: 0.03 10*3/uL (ref 0.00–0.07)
BASOS ABS: 0.1 10*3/uL (ref 0.0–0.1)
BASOS PCT: 1 %
EOS ABS: 0.1 10*3/uL (ref 0.0–0.5)
Eosinophils Relative: 2 %
HCT: 41 % (ref 36.0–46.0)
Hemoglobin: 12.9 g/dL (ref 12.0–15.0)
IMMATURE GRANULOCYTES: 1 %
Lymphocytes Relative: 17 %
Lymphs Abs: 1.1 10*3/uL (ref 0.7–4.0)
MCH: 29.1 pg (ref 26.0–34.0)
MCHC: 31.5 g/dL (ref 30.0–36.0)
MCV: 92.3 fL (ref 80.0–100.0)
MONOS PCT: 6 %
Monocytes Absolute: 0.4 10*3/uL (ref 0.1–1.0)
NEUTROS PCT: 73 %
NRBC: 0 % (ref 0.0–0.2)
Neutro Abs: 4.7 10*3/uL (ref 1.7–7.7)
PLATELETS: 142 10*3/uL — AB (ref 150–400)
RBC: 4.44 MIL/uL (ref 3.87–5.11)
RDW: 14.4 % (ref 11.5–15.5)
WBC: 6.3 10*3/uL (ref 4.0–10.5)

## 2018-07-05 LAB — I-STAT TROPONIN, ED: TROPONIN I, POC: 0 ng/mL (ref 0.00–0.08)

## 2018-07-05 LAB — I-STAT CHEM 8, ED
BUN: 20 mg/dL (ref 8–23)
Calcium, Ion: 1.03 mmol/L — ABNORMAL LOW (ref 1.15–1.40)
Chloride: 96 mmol/L — ABNORMAL LOW (ref 98–111)
Creatinine, Ser: 1 mg/dL (ref 0.44–1.00)
Glucose, Bld: 114 mg/dL — ABNORMAL HIGH (ref 70–99)
HCT: 38 % (ref 36.0–46.0)
HEMOGLOBIN: 12.9 g/dL (ref 12.0–15.0)
Potassium: 3 mmol/L — ABNORMAL LOW (ref 3.5–5.1)
SODIUM: 140 mmol/L (ref 135–145)
TCO2: 32 mmol/L (ref 22–32)

## 2018-07-05 LAB — BASIC METABOLIC PANEL
Anion gap: 11 (ref 5–15)
BUN: 19 mg/dL (ref 8–23)
CALCIUM: 8.2 mg/dL — AB (ref 8.9–10.3)
CO2: 29 mmol/L (ref 22–32)
Chloride: 100 mmol/L (ref 98–111)
Creatinine, Ser: 0.9 mg/dL (ref 0.44–1.00)
GFR calc Af Amer: >60 mL/min (ref >60)
GFR, EST NON AFRICAN AMERICAN: 58 mL/min — AB (ref >60)
Glucose, Bld: 117 mg/dL — ABNORMAL HIGH (ref 70–99)
Potassium: 3.2 mmol/L — ABNORMAL LOW (ref 3.5–5.1)
Sodium: 140 mmol/L (ref 135–145)

## 2018-07-05 LAB — BRAIN NATRIURETIC PEPTIDE: B NATRIURETIC PEPTIDE 5: 66.3 pg/mL (ref 0.0–100.0)

## 2018-07-05 MED ORDER — FUROSEMIDE 10 MG/ML IJ SOLN
40.0000 mg | Freq: Once | INTRAMUSCULAR | Status: DC
  Filled 2018-07-05: qty 4

## 2018-07-05 MED ORDER — MAGNESIUM OXIDE 400 (241.3 MG) MG PO TABS
800.0000 mg | ORAL_TABLET | Freq: Once | ORAL | Status: AC
  Administered 2018-07-05: 800 mg via ORAL
  Filled 2018-07-05: qty 2

## 2018-07-05 MED ORDER — FUROSEMIDE 20 MG PO TABS: 80.0000 mg | ORAL_TABLET | Freq: Once | ORAL | Status: DC

## 2018-07-05 MED ORDER — POTASSIUM CHLORIDE CRYS ER 20 MEQ PO TBCR
40.0000 meq | EXTENDED_RELEASE_TABLET | Freq: Once | ORAL | Status: AC
  Administered 2018-07-05: 40 meq via ORAL
  Filled 2018-07-05: qty 2

## 2018-07-05 MED ORDER — SODIUM CHLORIDE 0.9% FLUSH: 10.0000 mL | INTRAVENOUS | Status: DC | PRN

## 2018-07-05 NOTE — ED Notes (Signed)
Pt ambulated with walker in room with EDP at bedside.

## 2018-07-05 NOTE — ED Notes (Signed)
Pt given discharge instructions and follow up information. Pt verbalized understanding.

## 2018-07-05 NOTE — ED Provider Notes (Signed)
[facility] EMERGENCY DEPARTMENT Provider Note   CSN: [id number] Arrival date & time: 07/05/18  1047     History   Chief Complaint Chief Complaint  Patient presents with  . Shortness of Breath    HPI Tamara Morales is a 85 y.o. female.  85 yo F with a chief complaint of shortness of breath.  This been going on for the past couple days.  She has a mild cough with this.  Denies fevers or chills.  Denies sick contacts.  Patient recently had a history of heart failure.  She has been taking diuretics at home and has had some improvement.  She thinks that her weight is down a few pounds from her baseline.  Despite that she feels like she typically feels when she is fluid overloaded.  The history is provided by the patient.  Shortness of Breath  Severity:  Moderate Onset quality:  Gradual Duration:  2 days Timing:  Constant Progression:  Unchanged Chronicity:  New Context: not activity and not URI ( has a mild cough)   Relieved by:  Nothing Worsened by:  Nothing Ineffective treatments:  None tried Associated symptoms: no chest pain, no fever, no headaches, no vomiting and no wheezing     Past Medical History:  Diagnosis Date  . ARTHRITIS, KNEES, BILATERAL   . Breast CA (HCC) 2000   s/p R lumpectomy and XRT  . DEPRESSION   . DJD (degenerative joint disease) of knee   . GERD   . HYPERLIPIDEMIA   . HYPERTENSION   . HYPOTHYROIDISM    postsurgical  . Hypoxia   . MIGRAINE HEADACHE   . Morbid obesity (HCC)   . OSA (obstructive sleep apnea) 01/17/2011 dx  . Oxygen dependent   . Urge incontinence   . UTI (lower urinary tract infection) 12/2014    Patient Active Problem List   Diagnosis Date Noted  . Vitamin B12 deficiency 05/23/2018  . Pressure injury of skin 04/24/2018  . Anxiety 04/23/2018  . Chronic respiratory failure with hypoxia (HCC) 04/23/2018  . Acute on chronic diastolic (congestive) heart failure (HCC) 03/14/2018  . Diabetes mellitus type  2 in obese (HCC) 03/12/2018  . Lymphedema 11/20/2017  . Counseling regarding advanced care planning and goals of care 09/20/2017  . URI (upper respiratory infection) 08/30/2017  . Hypomagnesemia 07/17/2017  . Chest pain 07/16/2017  . Prediabetes 07/03/2017  . Hyperlipidemia LDL goal <70 07/03/2017  . Upper airway cough syndrome 06/27/2017  . CKD (chronic kidney disease) stage 3, GFR 30-59 ml/min (HCC) 05/22/2017  . Bilateral dry eyes 04/20/2017  . Hemorrhoids 04/09/2017  . Chronic acquired lymphedema 03/30/2017  . Chronic constipation 03/30/2017  . Weight gain 12/07/2016  . Neck pain on left side 10/05/2016  . Chronic left shoulder pain 08/10/2016  . Hematuria 08/10/2016  . Numbness and tingling of right leg 04/06/2016  . Contusion of right breast 04/06/2016  . IBS (irritable bowel syndrome) 11/18/2015  . Hypokalemia 07/07/2015  . Acid reflux 07/05/2015  . Lymphedema of both lower extremities 07/02/2015  . UTI (urinary tract infection) 07/02/2015  . Ureterolithiasis   . Ureteral stone with hydronephrosis 06/28/2015  . Right ureteral calculus 06/27/2015  . Depression with anxiety 06/27/2015  . History of breast cancer 06/27/2015  . Abdominal pain, RLQ (right lower quadrant) 06/27/2015  . Obesity hypoventilation syndrome (HCC) 11/27/2014  . Orthopnea 04/09/2013  . SOB (shortness of breath) 03/13/2013  . Other chest pain 11/30/2012  . Cough 03/05/2010  . Morbid  obesity ([place]) 10/04/2009  . Hypothyroidism (acquired) 08/05/2009  . Essential hypertension 08/05/2009    Past Surgical History:  Procedure Laterality Date  . ABDOMINAL HYSTERECTOMY    . ADENOIDECTOMY    . BREAST BIOPSY  2000  . BREAST LUMPECTOMY  2000  . CATARACT EXTRACTION    . CHOLECYSTECTOMY  1995  . CYSTOSCOPY/URETEROSCOPY/HOLMIUM LASER Right 06/28/2015   Procedure: CYSTOSCOPY RIGHT RETROGRAD RIGHT URETEROSCOPY/HOLMIUM LASER WITH RIGHT STENT PLACEMENT;  Surgeon: Alexis Frock, MD;  Location: [facility];  Service:  Urology;  Laterality: Right;  . PARATHYROIDECTOMY     2-3 removed  . REPLACEMENT TOTAL KNEE BILATERAL  1999, 2005  . Right leg femur repaired    . THYROIDECTOMY    . TONSILLECTOMY    . TUBAL LIGATION       OB History    Gravida  5   Para  5   Term      Preterm      AB      Living        SAB      TAB      Ectopic      Multiple      Live Births               Home Medications    Prior to Admission medications   Medication Sig Start Date End Date Taking? Authorizing Provider  acetaminophen (TYLENOL) 325 MG tablet Take 650 mg by mouth every 8 (eight) hours as needed for mild pain or moderate pain.     [provider]  aspirin 81 MG tablet Take 81 mg by mouth daily.     [provider]  furosemide (LASIX) 40 MG tablet Take 1.5 tablets (60 mg total) by mouth daily. Along with metolazone and potassium supplement 11/20/17   Blanchie Serve, MD  levothyroxine (SYNTHROID) 150 MCG tablet Take 150 mcg by mouth daily before breakfast.    [provider]  loratadine (CLARITIN) 10 MG tablet Take 10 mg by mouth daily as needed for allergies.     [provider]  losartan (COZAAR) 50 MG tablet TAKE 2 TABLETS ONCE A DAY. HOLD FOR SYSTOLIC BLOOD PRESSURE LESS [id number]. 07/01/18   Mast, Man X, NP  metolazone (ZAROXOLYN) 2.5 MG tablet TAKE 1 TABLET EACH DAY. 07/01/18   Mast, Man X, NP  omeprazole (PRILOSEC) 20 MG capsule Take 1 capsule (20 mg total) by mouth 2 (two) times daily before a meal. 04/25/18   Vann, Jessica U, DO  ondansetron (ZOFRAN) 4 MG tablet TAKE (1) TABLET BY MOUTH EVERY SIX HOURS AS NEEDED FOR NAUSEA. Patient taking differently: Take 4 mg by mouth every 6 (six) hours as needed for nausea.  03/26/18   Mast, Man X, NP  potassium chloride (K-DUR,KLOR-CON) 10 MEQ tablet Take 20 mEq by mouth daily as needed. Take medication if 2-3 lb /24 hours or 3-5 lbs/ week is gained.    [provider]  psyllium (REGULOID) 0.52 g capsule Take 1.04  g by mouth daily as needed (for mild constipation).     [provider]  sertraline (ZOLOFT) 50 MG tablet Take 1.5 tablets (75 mg total) by mouth daily. 05/23/18 05/23/19  Mast, Man X, NP  vitamin B-12 (CYANOCOBALAMIN) 1000 MCG tablet Take 1 tablet (1,000 mcg total) by mouth daily. 05/24/18   Mast, Man X, NP    Family History Family History  Problem Relation Age of Onset  . Emphysema Sister   . Coronary artery disease Neg Hx  Social History Social History   Tobacco Use  . Smoking status: Former Smoker    Packs/day: 0.25    Years: 2.00    Pack years: 0.50    Types: Cigarettes    Last attempt to quit: 06/19/1974    Years since quitting: 44.0  . Smokeless tobacco: Never Used  . Tobacco comment: Quit in late 20's  Substance Use Topics  . Alcohol use: No    Alcohol/week: 0.0 standard drinks    Comment: rare  . Drug use: No     Allergies   Azithromycin; Beta adrenergic blockers; Ciprofloxacin; Codeine; Darvon [propoxyphene]; Epinephrine; Klonopin [clonazepam]; Oxycodone; Paxil [paroxetine hydrochloride]; Prednisone; Propoxyphene hcl; Tape; Torsemide; Tramadol; Vicodin [hydrocodone-acetaminophen]; Meloxicam; and Vancomycin   Review of Systems Review of Systems  Constitutional: Negative for chills and fever.  HENT: Negative for congestion and rhinorrhea.   Eyes: Negative for redness and visual disturbance.  Respiratory: Positive for shortness of breath. Negative for wheezing.   Cardiovascular: Negative for chest pain and palpitations.  Gastrointestinal: Negative for nausea and vomiting.  Genitourinary: Negative for dysuria and urgency.  Musculoskeletal: Negative for arthralgias and myalgias.  Skin: Negative for pallor and wound.  Neurological: Negative for dizziness and headaches.     Physical Exam Updated Vital Signs BP 115/68   Pulse 78   Temp 98 F (36.7 C) (Oral)   Resp 15   SpO2 (!) 88%   Physical Exam Vitals signs and nursing note reviewed.    Constitutional:      General: She is not in acute distress.    Appearance: She is well-developed. She is not diaphoretic.  HENT:     Head: Normocephalic and atraumatic.  Eyes:     Pupils: Pupils are equal, round, and reactive to light.  Neck:     Musculoskeletal: Normal range of motion and neck supple.     Vascular: JVD (just above the clavicles bilaterally) present.  Cardiovascular:     Rate and Rhythm: Normal rate and regular rhythm.     Heart sounds: No murmur. No friction rub. No gallop.   Pulmonary:     Effort: Pulmonary effort is normal.     Breath sounds: Rales (trace at the bases) present. No wheezing.  Abdominal:     General: There is no distension.     Palpations: Abdomen is soft.     Tenderness: There is no abdominal tenderness.  Musculoskeletal:        General: No tenderness.  Skin:    General: Skin is warm and dry.  Neurological:     Mental Status: She is alert and oriented to person, place, and time.  Psychiatric:        Behavior: Behavior normal.      ED Treatments / Results  Labs (all labs ordered are listed, but only abnormal results are displayed) Labs Reviewed  CBC WITH DIFFERENTIAL/PLATELET - Abnormal; Notable for the following components:      Result Value   Platelets 142 (*)    All other components within normal limits  BASIC METABOLIC PANEL - Abnormal; Notable for the following components:   Potassium 3.2 (*)    Glucose, Bld 117 (*)    Calcium 8.2 (*)    GFR calc non Af Amer 58 (*)    All other components within normal limits  I-STAT CHEM 8, ED - Abnormal; Notable for the following components:   Potassium 3.0 (*)    Chloride 96 (*)    Glucose, Bld 114 (*)  Calcium, Ion 1.03 (*)    All other components within normal limits  BRAIN NATRIURETIC PEPTIDE  I-STAT TROPONIN, ED    EKG EKG Interpretation  Date/Time:  Friday July 05 2018 11:04:15 EST Ventricular Rate:  79 PR Interval:    QRS Duration: 101 QT Interval:  394 QTC  Calculation: 452 R Axis:     Text Interpretation:  Sinus rhythm No significant change since last tracing Confirmed by Deno Etienne [phone]) on 07/05/2018 11:16:08 AM   Radiology Dg Chest 2 View  Result Date: 07/05/2018 CLINICAL DATA:  Shortness of breath since yesterday. Chest pressure. History breast cancer, obesity and oxygen dependence. EXAM: CHEST - 2 VIEW COMPARISON:  Radiographs 05/06/2018 and 04/23/2018. Chest CT 04/23/2018. FINDINGS: Stable cardiomegaly and aortic atherosclerosis. There is mild chronic lung disease without superimposed airspace disease, pleural effusion or pneumothorax. Postsurgical changes are present in the right breast and right axilla. There is a convex right thoracic scoliosis without acute osseous findings. Multiple telemetry leads overlie the chest. IMPRESSION: Stable chest without evidence of acute cardiopulmonary process. Electronically Signed   By: Richardean Sale M.D.   On: 07/05/2018 12:32    Procedures Procedures (including critical care time)  Medications Ordered in ED Medications  sodium chloride flush (NS) 0.9 % injection 10-40 mL (has no administration in time range)  furosemide (LASIX) injection 40 mg (40 mg Intravenous Not Given 07/05/18 1558)  furosemide (LASIX) tablet 80 mg (has no administration in time range)  potassium chloride SA (K-DUR,KLOR-CON) CR tablet 40 mEq (40 mEq Oral Given 07/05/18 1422)  magnesium oxide (MAG-OX) tablet 800 mg (800 mg Oral Given 07/05/18 1433)     Initial Impression / Assessment and Plan / ED Course  I have reviewed the triage vital signs and the nursing notes.  Pertinent labs & imaging results that were available during my care of the patient were reviewed by me and considered in my medical decision making (see chart for details).     85 yo F with a chief complaint of shortness of breath.  She has some mild signs of fluid overload on my exam.  Chest x-ray reviewed by me without overt fluid overload.   EKG without  concerning finding.    Patient was able to ambulate without much difficulty.  Her BNP is 68.  With this new cough I suspect most likely she has a viral syndrome.  She is well-appearing and nontoxic.  She does have some fluid clinically on exam and so I will give her an x-ray dose of Lasix.  We will have her follow-up with her cardiologist and family doctor.  4:05 PM:  I have discussed the diagnosis/risks/treatment options with the patient and believe the pt to be eligible for discharge home to follow-up with PCP. We also discussed returning to the ED immediately if new or worsening sx occur. We discussed the sx which are most concerning (e.g., sudden worsening sob, chest pain, fever, inability to tolerate by mouth) that necessitate immediate return. Medications administered to the patient during their visit and any new prescriptions provided to the patient are listed below.  Medications given during this visit Medications  sodium chloride flush (NS) 0.9 % injection 10-40 mL (has no administration in time range)  furosemide (LASIX) injection 40 mg (40 mg Intravenous Not Given 07/05/18 1558)  furosemide (LASIX) tablet 80 mg (has no administration in time range)  potassium chloride SA (K-DUR,KLOR-CON) CR tablet 40 mEq (40 mEq Oral Given 07/05/18 1422)  magnesium oxide (MAG-OX) tablet 800 mg (  800 mg Oral Given 07/05/18 1433)     The patient appears reasonably screen and/or stabilized for discharge and I doubt any other medical condition or other [facility] requiring further screening, evaluation, or treatment in the ED at this time prior to discharge.    Final Clinical Impressions(s) / ED Diagnoses   Final diagnoses:  SOB (shortness of breath)    ED Discharge Orders    None       Deno Etienne, DO 07/05/18 1605

## 2018-07-05 NOTE — ED Triage Notes (Signed)
Per [facility] pt from [facility] hx of CHF with SOB x2 hours. BBS clear/diminished. CP has subsided, no treatment. Takes ASA daily

## 2018-07-05 NOTE — Discharge Instructions (Signed)
Follow up with your family doc.  Call the cardiologist and see when they want to see you in the office.  Return for worsening shortness of breath or chest pain.

## 2018-07-08 ENCOUNTER — Other Ambulatory Visit: Payer: Self-pay | Admitting: Nurse Practitioner

## 2018-07-08 DIAGNOSIS — K219 Gastro-esophageal reflux disease without esophagitis: Secondary | ICD-10-CM

## 2018-07-09 DIAGNOSIS — N3946 Mixed incontinence: Secondary | ICD-10-CM | POA: Diagnosis not present

## 2018-07-09 DIAGNOSIS — I5089 Other heart failure: Secondary | ICD-10-CM | POA: Diagnosis not present

## 2018-07-09 DIAGNOSIS — M6281 Muscle weakness (generalized): Secondary | ICD-10-CM | POA: Diagnosis not present

## 2018-07-09 DIAGNOSIS — M545 Low back pain: Secondary | ICD-10-CM | POA: Diagnosis not present

## 2018-07-09 DIAGNOSIS — R159 Full incontinence of feces: Secondary | ICD-10-CM | POA: Diagnosis not present

## 2018-07-09 DIAGNOSIS — R2681 Unsteadiness on feet: Secondary | ICD-10-CM | POA: Diagnosis not present

## 2018-07-10 DIAGNOSIS — R2681 Unsteadiness on feet: Secondary | ICD-10-CM | POA: Diagnosis not present

## 2018-07-10 DIAGNOSIS — N3946 Mixed incontinence: Secondary | ICD-10-CM | POA: Diagnosis not present

## 2018-07-10 DIAGNOSIS — I5089 Other heart failure: Secondary | ICD-10-CM | POA: Diagnosis not present

## 2018-07-10 DIAGNOSIS — M545 Low back pain: Secondary | ICD-10-CM | POA: Diagnosis not present

## 2018-07-10 DIAGNOSIS — M6281 Muscle weakness (generalized): Secondary | ICD-10-CM | POA: Diagnosis not present

## 2018-07-10 DIAGNOSIS — R159 Full incontinence of feces: Secondary | ICD-10-CM | POA: Diagnosis not present

## 2018-07-11 DIAGNOSIS — R159 Full incontinence of feces: Secondary | ICD-10-CM | POA: Diagnosis not present

## 2018-07-11 DIAGNOSIS — R2681 Unsteadiness on feet: Secondary | ICD-10-CM | POA: Diagnosis not present

## 2018-07-11 DIAGNOSIS — I5089 Other heart failure: Secondary | ICD-10-CM | POA: Diagnosis not present

## 2018-07-11 DIAGNOSIS — M545 Low back pain: Secondary | ICD-10-CM | POA: Diagnosis not present

## 2018-07-11 DIAGNOSIS — M6281 Muscle weakness (generalized): Secondary | ICD-10-CM | POA: Diagnosis not present

## 2018-07-11 DIAGNOSIS — N3946 Mixed incontinence: Secondary | ICD-10-CM | POA: Diagnosis not present

## 2018-07-15 ENCOUNTER — Other Ambulatory Visit: Payer: Self-pay | Admitting: Nurse Practitioner

## 2018-07-15 DIAGNOSIS — I5089 Other heart failure: Secondary | ICD-10-CM | POA: Diagnosis not present

## 2018-07-15 DIAGNOSIS — R2681 Unsteadiness on feet: Secondary | ICD-10-CM | POA: Diagnosis not present

## 2018-07-15 DIAGNOSIS — M6281 Muscle weakness (generalized): Secondary | ICD-10-CM | POA: Diagnosis not present

## 2018-07-15 DIAGNOSIS — M545 Low back pain: Secondary | ICD-10-CM | POA: Diagnosis not present

## 2018-07-15 DIAGNOSIS — R159 Full incontinence of feces: Secondary | ICD-10-CM | POA: Diagnosis not present

## 2018-07-15 DIAGNOSIS — N3946 Mixed incontinence: Secondary | ICD-10-CM | POA: Diagnosis not present

## 2018-07-16 DIAGNOSIS — R310 Gross hematuria: Secondary | ICD-10-CM | POA: Diagnosis not present

## 2018-07-16 DIAGNOSIS — N3000 Acute cystitis without hematuria: Secondary | ICD-10-CM | POA: Diagnosis not present

## 2018-07-16 DIAGNOSIS — N3946 Mixed incontinence: Secondary | ICD-10-CM | POA: Diagnosis not present

## 2018-07-16 DIAGNOSIS — R3914 Feeling of incomplete bladder emptying: Secondary | ICD-10-CM | POA: Diagnosis not present

## 2018-07-17 DIAGNOSIS — R2681 Unsteadiness on feet: Secondary | ICD-10-CM | POA: Diagnosis not present

## 2018-07-17 DIAGNOSIS — R159 Full incontinence of feces: Secondary | ICD-10-CM | POA: Diagnosis not present

## 2018-07-17 DIAGNOSIS — N3946 Mixed incontinence: Secondary | ICD-10-CM | POA: Diagnosis not present

## 2018-07-17 DIAGNOSIS — I5089 Other heart failure: Secondary | ICD-10-CM | POA: Diagnosis not present

## 2018-07-17 DIAGNOSIS — M545 Low back pain: Secondary | ICD-10-CM | POA: Diagnosis not present

## 2018-07-17 DIAGNOSIS — M6281 Muscle weakness (generalized): Secondary | ICD-10-CM | POA: Diagnosis not present

## 2018-07-18 DIAGNOSIS — E039 Hypothyroidism, unspecified: Secondary | ICD-10-CM

## 2018-07-18 DIAGNOSIS — N3946 Mixed incontinence: Secondary | ICD-10-CM | POA: Diagnosis not present

## 2018-07-18 DIAGNOSIS — I5089 Other heart failure: Secondary | ICD-10-CM | POA: Diagnosis not present

## 2018-07-18 DIAGNOSIS — E538 Deficiency of other specified B group vitamins: Secondary | ICD-10-CM | POA: Diagnosis not present

## 2018-07-18 DIAGNOSIS — R2681 Unsteadiness on feet: Secondary | ICD-10-CM | POA: Diagnosis not present

## 2018-07-18 DIAGNOSIS — R159 Full incontinence of feces: Secondary | ICD-10-CM | POA: Diagnosis not present

## 2018-07-18 DIAGNOSIS — M6281 Muscle weakness (generalized): Secondary | ICD-10-CM | POA: Diagnosis not present

## 2018-07-18 DIAGNOSIS — M545 Low back pain: Secondary | ICD-10-CM | POA: Diagnosis not present

## 2018-07-19 DIAGNOSIS — M6281 Muscle weakness (generalized): Secondary | ICD-10-CM | POA: Diagnosis not present

## 2018-07-19 DIAGNOSIS — N3946 Mixed incontinence: Secondary | ICD-10-CM | POA: Diagnosis not present

## 2018-07-19 DIAGNOSIS — R159 Full incontinence of feces: Secondary | ICD-10-CM | POA: Diagnosis not present

## 2018-07-19 DIAGNOSIS — I5089 Other heart failure: Secondary | ICD-10-CM | POA: Diagnosis not present

## 2018-07-19 DIAGNOSIS — M545 Low back pain: Secondary | ICD-10-CM | POA: Diagnosis not present

## 2018-07-19 DIAGNOSIS — R2681 Unsteadiness on feet: Secondary | ICD-10-CM | POA: Diagnosis not present

## 2018-07-19 LAB — VITAMIN B12: Vitamin B-12: 884 pg/mL (ref 200–1100)

## 2018-07-19 LAB — TSH: TSH: 5.4 mIU/L — ABNORMAL HIGH (ref 0.40–4.50)

## 2018-07-19 LAB — T4, FREE: Free T4: 1 ng/dL (ref 0.8–1.8)

## 2018-07-22 DIAGNOSIS — N3946 Mixed incontinence: Secondary | ICD-10-CM | POA: Diagnosis not present

## 2018-07-22 DIAGNOSIS — I5089 Other heart failure: Secondary | ICD-10-CM | POA: Diagnosis not present

## 2018-07-22 DIAGNOSIS — M6281 Muscle weakness (generalized): Secondary | ICD-10-CM | POA: Diagnosis not present

## 2018-07-22 DIAGNOSIS — R159 Full incontinence of feces: Secondary | ICD-10-CM | POA: Diagnosis not present

## 2018-07-23 DIAGNOSIS — N3946 Mixed incontinence: Secondary | ICD-10-CM | POA: Diagnosis not present

## 2018-07-23 DIAGNOSIS — I5089 Other heart failure: Secondary | ICD-10-CM | POA: Diagnosis not present

## 2018-07-23 DIAGNOSIS — R159 Full incontinence of feces: Secondary | ICD-10-CM | POA: Diagnosis not present

## 2018-07-23 DIAGNOSIS — M6281 Muscle weakness (generalized): Secondary | ICD-10-CM | POA: Diagnosis not present

## 2018-07-31 ENCOUNTER — Other Ambulatory Visit: Payer: Self-pay | Admitting: Nurse Practitioner

## 2018-08-06 DIAGNOSIS — I5089 Other heart failure: Secondary | ICD-10-CM | POA: Diagnosis not present

## 2018-08-06 DIAGNOSIS — N3946 Mixed incontinence: Secondary | ICD-10-CM | POA: Diagnosis not present

## 2018-08-06 DIAGNOSIS — R159 Full incontinence of feces: Secondary | ICD-10-CM | POA: Diagnosis not present

## 2018-08-06 DIAGNOSIS — M6281 Muscle weakness (generalized): Secondary | ICD-10-CM | POA: Diagnosis not present

## 2018-08-09 ENCOUNTER — Observation Stay (HOSPITAL_COMMUNITY)
Admission: EM | Admit: 2018-08-09 | Discharge: 2018-08-10 | Disposition: A | Discharge status: Home / Self Care | Payer: Medicare Other | Attending: Family Medicine | Admitting: Family Medicine

## 2018-08-09 ENCOUNTER — Emergency Department (HOSPITAL_COMMUNITY): Payer: Medicare Other

## 2018-08-09 ENCOUNTER — Observation Stay (HOSPITAL_BASED_OUTPATIENT_CLINIC_OR_DEPARTMENT_OTHER): Payer: Medicare Other

## 2018-08-09 ENCOUNTER — Other Ambulatory Visit: Payer: Self-pay

## 2018-08-09 ENCOUNTER — Encounter (HOSPITAL_COMMUNITY): Payer: Self-pay | Admitting: Emergency Medicine

## 2018-08-09 DIAGNOSIS — Z66 Do not resuscitate: Secondary | ICD-10-CM | POA: Insufficient documentation

## 2018-08-09 DIAGNOSIS — E1122 Type 2 diabetes mellitus with diabetic chronic kidney disease: Secondary | ICD-10-CM | POA: Insufficient documentation

## 2018-08-09 DIAGNOSIS — Z87891 Personal history of nicotine dependence: Secondary | ICD-10-CM | POA: Diagnosis not present

## 2018-08-09 DIAGNOSIS — E785 Hyperlipidemia, unspecified: Secondary | ICD-10-CM | POA: Diagnosis not present

## 2018-08-09 DIAGNOSIS — I5032 Chronic diastolic (congestive) heart failure: Secondary | ICD-10-CM | POA: Diagnosis not present

## 2018-08-09 DIAGNOSIS — G43909 Migraine, unspecified, not intractable, without status migrainosus: Secondary | ICD-10-CM | POA: Insufficient documentation

## 2018-08-09 DIAGNOSIS — E538 Deficiency of other specified B group vitamins: Secondary | ICD-10-CM | POA: Diagnosis present

## 2018-08-09 DIAGNOSIS — E039 Hypothyroidism, unspecified: Secondary | ICD-10-CM | POA: Diagnosis not present

## 2018-08-09 DIAGNOSIS — R0982 Postnasal drip: Secondary | ICD-10-CM | POA: Diagnosis not present

## 2018-08-09 DIAGNOSIS — G4733 Obstructive sleep apnea (adult) (pediatric): Secondary | ICD-10-CM | POA: Diagnosis not present

## 2018-08-09 DIAGNOSIS — Z79899 Other long term (current) drug therapy: Secondary | ICD-10-CM | POA: Insufficient documentation

## 2018-08-09 DIAGNOSIS — I890 Lymphedema, not elsewhere classified: Secondary | ICD-10-CM

## 2018-08-09 DIAGNOSIS — R0600 Dyspnea, unspecified: Secondary | ICD-10-CM | POA: Diagnosis not present

## 2018-08-09 DIAGNOSIS — E1169 Type 2 diabetes mellitus with other specified complication: Secondary | ICD-10-CM | POA: Diagnosis present

## 2018-08-09 DIAGNOSIS — K581 Irritable bowel syndrome with constipation: Secondary | ICD-10-CM | POA: Insufficient documentation

## 2018-08-09 DIAGNOSIS — I130 Hypertensive heart and chronic kidney disease with heart failure and stage 1 through stage 4 chronic kidney disease, or unspecified chronic kidney disease: Secondary | ICD-10-CM | POA: Diagnosis not present

## 2018-08-09 DIAGNOSIS — E669 Obesity, unspecified: Secondary | ICD-10-CM | POA: Diagnosis present

## 2018-08-09 DIAGNOSIS — N183 Chronic kidney disease, stage 3 unspecified: Secondary | ICD-10-CM | POA: Diagnosis present

## 2018-08-09 DIAGNOSIS — R262 Difficulty in walking, not elsewhere classified: Secondary | ICD-10-CM | POA: Insufficient documentation

## 2018-08-09 DIAGNOSIS — I700 Atherosclerosis of aorta: Secondary | ICD-10-CM | POA: Diagnosis not present

## 2018-08-09 DIAGNOSIS — G8929 Other chronic pain: Secondary | ICD-10-CM | POA: Insufficient documentation

## 2018-08-09 DIAGNOSIS — Z7982 Long term (current) use of aspirin: Secondary | ICD-10-CM | POA: Insufficient documentation

## 2018-08-09 DIAGNOSIS — Z79890 Hormone replacement therapy (postmenopausal): Secondary | ICD-10-CM | POA: Diagnosis not present

## 2018-08-09 DIAGNOSIS — Z6841 Body Mass Index (BMI) 40.0 and over, adult: Secondary | ICD-10-CM | POA: Diagnosis not present

## 2018-08-09 DIAGNOSIS — K219 Gastro-esophageal reflux disease without esophagitis: Secondary | ICD-10-CM | POA: Insufficient documentation

## 2018-08-09 DIAGNOSIS — Z9981 Dependence on supplemental oxygen: Secondary | ICD-10-CM | POA: Insufficient documentation

## 2018-08-09 DIAGNOSIS — M7989 Other specified soft tissue disorders: Secondary | ICD-10-CM | POA: Diagnosis not present

## 2018-08-09 DIAGNOSIS — Z853 Personal history of malignant neoplasm of breast: Secondary | ICD-10-CM | POA: Insufficient documentation

## 2018-08-09 DIAGNOSIS — F329 Major depressive disorder, single episode, unspecified: Secondary | ICD-10-CM | POA: Diagnosis not present

## 2018-08-09 DIAGNOSIS — J9811 Atelectasis: Secondary | ICD-10-CM | POA: Diagnosis not present

## 2018-08-09 DIAGNOSIS — I959 Hypotension, unspecified: Secondary | ICD-10-CM | POA: Diagnosis not present

## 2018-08-09 DIAGNOSIS — R05 Cough: Secondary | ICD-10-CM | POA: Diagnosis not present

## 2018-08-09 DIAGNOSIS — R0602 Shortness of breath: Secondary | ICD-10-CM | POA: Diagnosis not present

## 2018-08-09 DIAGNOSIS — R062 Wheezing: Secondary | ICD-10-CM | POA: Diagnosis not present

## 2018-08-09 LAB — BASIC METABOLIC PANEL
ANION GAP: 9 (ref 5–15)
BUN: 23 mg/dL (ref 8–23)
CO2: 30 mmol/L (ref 22–32)
Calcium: 8.1 mg/dL — ABNORMAL LOW (ref 8.9–10.3)
Chloride: 99 mmol/L (ref 98–111)
Creatinine, Ser: 0.97 mg/dL (ref 0.44–1.00)
GFR calc Af Amer: >60 mL/min (ref >60)
GFR calc non Af Amer: 53 mL/min — ABNORMAL LOW (ref >60)
Glucose, Bld: 133 mg/dL — ABNORMAL HIGH (ref 70–99)
Potassium: 3.8 mmol/L (ref 3.5–5.1)
Sodium: 138 mmol/L (ref 135–145)

## 2018-08-09 LAB — CBC WITH DIFFERENTIAL/PLATELET
Abs Immature Granulocytes: 0.06 10*3/uL (ref 0.00–0.07)
BASOS ABS: 0.1 10*3/uL (ref 0.0–0.1)
Basophils Relative: 1 %
Eosinophils Absolute: 0.3 10*3/uL (ref 0.0–0.5)
Eosinophils Relative: 4 %
HCT: 39.1 % (ref 36.0–46.0)
Hemoglobin: 12.2 g/dL (ref 12.0–15.0)
Immature Granulocytes: 1 %
LYMPHS ABS: 1.7 10*3/uL (ref 0.7–4.0)
LYMPHS PCT: 24 %
MCH: 30 pg (ref 26.0–34.0)
MCHC: 31.2 g/dL (ref 30.0–36.0)
MCV: 96.3 fL (ref 80.0–100.0)
Monocytes Absolute: 0.6 10*3/uL (ref 0.1–1.0)
Monocytes Relative: 8 %
NRBC: 0 % (ref 0.0–0.2)
Neutro Abs: 4.6 10*3/uL (ref 1.7–7.7)
Neutrophils Relative %: 62 %
Platelets: 129 10*3/uL — ABNORMAL LOW (ref 150–400)
RBC: 4.06 MIL/uL (ref 3.87–5.11)
RDW: 14.8 % (ref 11.5–15.5)
WBC: 7.3 10*3/uL (ref 4.0–10.5)

## 2018-08-09 LAB — INFLUENZA PANEL BY PCR (TYPE A & B)
Influenza A By PCR: NEGATIVE
Influenza B By PCR: NEGATIVE

## 2018-08-09 LAB — MRSA PCR SCREENING: MRSA by PCR: NEGATIVE

## 2018-08-09 LAB — I-STAT TROPONIN, ED: Troponin i, poc: 0.01 ng/mL (ref 0.00–0.08)

## 2018-08-09 LAB — TROPONIN I: Troponin I: <0.03 ng/mL (ref <0.03)

## 2018-08-09 LAB — BRAIN NATRIURETIC PEPTIDE: B Natriuretic Peptide: 73 pg/mL (ref 0.0–100.0)

## 2018-08-09 MED ORDER — ACETAMINOPHEN 325 MG PO TABS: 650.0000 mg | ORAL_TABLET | Freq: Four times a day (QID) | ORAL | Status: DC | PRN

## 2018-08-09 MED ORDER — ALBUTEROL SULFATE (2.5 MG/3ML) 0.083% IN NEBU: 2.5000 mg | INHALATION_SOLUTION | RESPIRATORY_TRACT | Status: DC | PRN

## 2018-08-09 MED ORDER — LORATADINE 10 MG PO TABS: 10.0000 mg | ORAL_TABLET | Freq: Every day | ORAL | Status: DC | PRN

## 2018-08-09 MED ORDER — SODIUM CHLORIDE (PF) 0.9 % IJ SOLN
INTRAMUSCULAR | Status: AC
  Filled 2018-08-09: qty 50

## 2018-08-09 MED ORDER — VITAMIN B-12 1000 MCG PO TABS: 1000.0000 ug | ORAL_TABLET | Freq: Every day | ORAL | Status: DC

## 2018-08-09 MED ORDER — FLUTICASONE PROPIONATE 50 MCG/ACT NA SUSP
2.0000 | Freq: Every day | NASAL | Status: DC
  Administered 2018-08-09 – 2018-08-10 (×2): 2 via NASAL
  Filled 2018-08-09: qty 16

## 2018-08-09 MED ORDER — ONDANSETRON HCL 4 MG PO TABS
4.0000 mg | ORAL_TABLET | Freq: Four times a day (QID) | ORAL | Status: DC | PRN
  Administered 2018-08-09: 4 mg via ORAL
  Filled 2018-08-09: qty 1

## 2018-08-09 MED ORDER — SODIUM CHLORIDE 0.9% FLUSH: 3.0000 mL | INTRAVENOUS | Status: DC | PRN

## 2018-08-09 MED ORDER — ZOLPIDEM TARTRATE 5 MG PO TABS
5.0000 mg | ORAL_TABLET | Freq: Once | ORAL | Status: AC
  Administered 2018-08-09: 5 mg via ORAL
  Filled 2018-08-09: qty 1

## 2018-08-09 MED ORDER — IOPAMIDOL (ISOVUE-370) INJECTION 76%
100.0000 mL | Freq: Once | INTRAVENOUS | Status: AC | PRN
  Administered 2018-08-09: 100 mL via INTRAVENOUS

## 2018-08-09 MED ORDER — ENOXAPARIN SODIUM 40 MG/0.4ML ~~LOC~~ SOLN
40.0000 mg | SUBCUTANEOUS | Status: DC
  Administered 2018-08-09 – 2018-08-10 (×2): 40 mg via SUBCUTANEOUS
  Filled 2018-08-09 (×2): qty 0.4

## 2018-08-09 MED ORDER — SODIUM CHLORIDE 0.9 % IV SOLN: 250.0000 mL | INTRAVENOUS | Status: DC | PRN

## 2018-08-09 MED ORDER — SODIUM CHLORIDE 0.9 % IV BOLUS
500.0000 mL | Freq: Once | INTRAVENOUS | Status: AC
  Administered 2018-08-09: 500 mL via INTRAVENOUS

## 2018-08-09 MED ORDER — LEVOTHYROXINE SODIUM 150 MCG PO TABS
150.0000 ug | ORAL_TABLET | Freq: Every day | ORAL | Status: DC
  Administered 2018-08-10: 150 ug via ORAL
  Filled 2018-08-09: qty 1

## 2018-08-09 MED ORDER — ALUM & MAG HYDROXIDE-SIMETH 200-200-20 MG/5ML PO SUSP
15.0000 mL | ORAL | Status: DC | PRN
  Administered 2018-08-09: 15 mL via ORAL
  Filled 2018-08-09: qty 30

## 2018-08-09 MED ORDER — PANTOPRAZOLE SODIUM 40 MG PO TBEC
40.0000 mg | DELAYED_RELEASE_TABLET | Freq: Every day | ORAL | Status: DC
  Administered 2018-08-09 – 2018-08-10 (×2): 40 mg via ORAL
  Filled 2018-08-09 (×2): qty 1

## 2018-08-09 MED ORDER — SERTRALINE HCL 50 MG PO TABS
75.0000 mg | ORAL_TABLET | Freq: Every day | ORAL | Status: DC
  Administered 2018-08-09 – 2018-08-10 (×2): 75 mg via ORAL
  Filled 2018-08-09 (×2): qty 1

## 2018-08-09 MED ORDER — ASPIRIN EC 81 MG PO TBEC
81.0000 mg | DELAYED_RELEASE_TABLET | Freq: Every day | ORAL | Status: DC
  Administered 2018-08-09 – 2018-08-10 (×2): 81 mg via ORAL
  Filled 2018-08-09 (×2): qty 1

## 2018-08-09 MED ORDER — ACETAMINOPHEN 650 MG RE SUPP: 650.0000 mg | Freq: Four times a day (QID) | RECTAL | Status: DC | PRN

## 2018-08-09 MED ORDER — ONDANSETRON HCL 4 MG/2ML IJ SOLN: 4.0000 mg | Freq: Four times a day (QID) | INTRAMUSCULAR | Status: DC | PRN

## 2018-08-09 MED ORDER — IOPAMIDOL (ISOVUE-370) INJECTION 76%
INTRAVENOUS | Status: AC
  Filled 2018-08-09: qty 100

## 2018-08-09 MED ORDER — DOCUSATE SODIUM 100 MG PO CAPS
100.0000 mg | ORAL_CAPSULE | Freq: Two times a day (BID) | ORAL | Status: DC
  Administered 2018-08-09 – 2018-08-10 (×3): 100 mg via ORAL
  Filled 2018-08-09 (×3): qty 1

## 2018-08-09 MED ORDER — SODIUM CHLORIDE 0.9% FLUSH
3.0000 mL | Freq: Two times a day (BID) | INTRAVENOUS | Status: DC
  Administered 2018-08-09 – 2018-08-10 (×2): 3 mL via INTRAVENOUS

## 2018-08-09 NOTE — ED Notes (Signed)
EKG given to EDP,Palumbo,MD., for review.

## 2018-08-09 NOTE — ED Triage Notes (Signed)
[facility] ED from [facility] due to Shortness of breath.  Pt denies any chest pain

## 2018-08-09 NOTE — ED Notes (Signed)
Pt to ct at this time.

## 2018-08-09 NOTE — ED Provider Notes (Signed)
[facility] DEPT Provider Note   CSN: [id number] Arrival date & time: 08/09/18  8768    History   Chief Complaint Chief Complaint  Patient presents with  . Shortness of Breath    HPI Tamara Morales is a 85 y.o. female.     The history is provided by the patient and the EMS personnel.  Shortness of Breath  Severity:  Moderate Onset quality:  Gradual Timing:  Constant Progression:  Worsening Chronicity:  Recurrent Context: not activity, not animal exposure, not emotional upset, not fumes, not pollens and not smoke exposure   Relieved by:  Nothing Worsened by:  Nothing Ineffective treatments:  None tried Associated symptoms: wheezing   Associated symptoms: no abdominal pain, no chest pain, no diaphoresis, no fever and no rash   Risk factors: obesity   Risk factors: no recent surgery   Patient with a h/o CHF on home O2 presents with worsening SOB and inability to sleep sitting up in a recliner this evening.  No CP.  No f/c/r.    Past Medical History:  Diagnosis Date  . ARTHRITIS, KNEES, BILATERAL   . Breast CA ([place]) 2000   s/p R lumpectomy and XRT  . DEPRESSION   . DJD (degenerative joint disease) of knee   . GERD   . HYPERLIPIDEMIA   . HYPERTENSION   . HYPOTHYROIDISM    postsurgical  . Hypoxia   . MIGRAINE HEADACHE   . Morbid obesity ([place])   . OSA (obstructive sleep apnea) 01/17/2011 dx  . Oxygen dependent   . Urge incontinence   . UTI (lower urinary tract infection) 12/2014    Patient Active Problem List   Diagnosis Date Noted  . Vitamin B12 deficiency 05/23/2018  . Pressure injury of skin 04/24/2018  . Anxiety 04/23/2018  . Chronic respiratory failure with hypoxia ([place]) 04/23/2018  . Acute on chronic diastolic (congestive) heart failure ([place]) 03/14/2018  . Diabetes mellitus type 2 in obese ([place]) 03/12/2018  . Lymphedema 11/20/2017  . Counseling regarding advanced care planning and goals of care 09/20/2017  . URI (upper  respiratory infection) 08/30/2017  . Hypomagnesemia 07/17/2017  . Chest pain 07/16/2017  . Prediabetes 07/03/2017  . Hyperlipidemia LDL goal <70 07/03/2017  . Upper airway cough syndrome 06/27/2017  . CKD (chronic kidney disease) stage 3, GFR 30-59 ml/min (HCC) 05/22/2017  . Bilateral dry eyes 04/20/2017  . Hemorrhoids 04/09/2017  . Chronic acquired lymphedema 03/30/2017  . Chronic constipation 03/30/2017  . Weight gain 12/07/2016  . Neck pain on left side 10/05/2016  . Chronic left shoulder pain 08/10/2016  . Hematuria 08/10/2016  . Numbness and tingling of right leg 04/06/2016  . Contusion of right breast 04/06/2016  . IBS (irritable bowel syndrome) 11/18/2015  . Hypokalemia 07/07/2015  . Acid reflux 07/05/2015  . Lymphedema of both lower extremities 07/02/2015  . UTI (urinary tract infection) 07/02/2015  . Ureterolithiasis   . Ureteral stone with hydronephrosis 06/28/2015  . Right ureteral calculus 06/27/2015  . Depression with anxiety 06/27/2015  . History of breast cancer 06/27/2015  . Abdominal pain, RLQ (right lower quadrant) 06/27/2015  . Obesity hypoventilation syndrome ([place]) 11/27/2014  . Orthopnea 04/09/2013  . SOB (shortness of breath) 03/13/2013  . Other chest pain 11/30/2012  . Cough 03/05/2010  . Morbid obesity ([place]) 10/04/2009  . Hypothyroidism (acquired) 08/05/2009  . Essential hypertension 08/05/2009    Past Surgical History:  Procedure Laterality Date  . ABDOMINAL HYSTERECTOMY    . ADENOIDECTOMY    .  BREAST BIOPSY  2000  . BREAST LUMPECTOMY  2000  . CATARACT EXTRACTION    . CHOLECYSTECTOMY  1995  . CYSTOSCOPY/URETEROSCOPY/HOLMIUM LASER Right 06/28/2015   Procedure: CYSTOSCOPY RIGHT RETROGRAD RIGHT URETEROSCOPY/HOLMIUM LASER WITH RIGHT STENT PLACEMENT;  Surgeon: Alexis Frock, MD;  Location: [facility];  Service: Urology;  Laterality: Right;  . PARATHYROIDECTOMY     2-3 removed  . REPLACEMENT TOTAL KNEE BILATERAL  1999, 2005  . Right leg femur repaired     . THYROIDECTOMY    . TONSILLECTOMY    . TUBAL LIGATION       OB History    Gravida  5   Para  5   Term      Preterm      AB      Living        SAB      TAB      Ectopic      Multiple      Live Births               Home Medications    Prior to Admission medications   Medication Sig Start Date End Date Taking? Authorizing Provider  acetaminophen (TYLENOL) 325 MG tablet Take 650 mg by mouth every 8 (eight) hours as needed for mild pain or moderate pain.     [provider]  aspirin 81 MG tablet Take 81 mg by mouth daily.     [provider]  furosemide (LASIX) 40 MG tablet Take 1.5 tablets (60 mg total) by mouth daily. Along with metolazone and potassium supplement 11/20/17   Blanchie Serve, MD  levothyroxine (SYNTHROID) 150 MCG tablet Take 150 mcg by mouth daily before breakfast.    [provider]  loratadine (CLARITIN) 10 MG tablet Take 10 mg by mouth daily as needed for allergies.     [provider]  losartan (COZAAR) 50 MG tablet TAKE 2 TABLETS ONCE A DAY. HOLD FOR SYSTOLIC BLOOD PRESSURE LESS [id number]. 08/01/18   Mast, Man X, NP  metolazone (ZAROXOLYN) 2.5 MG tablet TAKE 1 TABLET EACH DAY. 07/01/18   Mast, Man X, NP  omeprazole (PRILOSEC) 20 MG capsule TAKE 1 CAPSULE TWICE DAILY BEFORE MEALS 07/08/18   Mast, Man X, NP  ondansetron (ZOFRAN) 4 MG tablet TAKE (1) TABLET BY MOUTH EVERY SIX HOURS AS NEEDED FOR NAUSEA. Patient taking differently: Take 4 mg by mouth every 6 (six) hours as needed for nausea.  03/26/18   Mast, Man X, NP  potassium chloride (K-DUR,KLOR-CON) 10 MEQ tablet Take 20 mEq by mouth daily as needed. Take medication if 2-3 lb /24 hours or 3-5 lbs/ week is gained.    [provider]  potassium chloride SA (K-DUR,KLOR-CON) 20 MEQ tablet TAKE 3 TABLETS TWICE DAILY. 07/15/18   Mast, Man X, NP  psyllium (REGULOID) 0.52 g capsule Take 1.04 g by mouth daily as needed (for mild constipation).     [provider]  sertraline (ZOLOFT) 50 MG tablet Take 1.5 tablets (75 mg total) by mouth daily. 05/23/18 05/23/19  Mast, Man X, NP  vitamin B-12 (CYANOCOBALAMIN) 1000 MCG tablet Take 1 tablet (1,000 mcg total) by mouth daily. 05/24/18   Mast, Man X, NP    Family History Family History  Problem Relation Age of Onset  . Emphysema Sister   . Coronary artery disease Neg Hx     Social History Social History   Tobacco Use  . Smoking status: Former Smoker    Packs/day: 0.25  Years: 2.00    Pack years: 0.50    Types: Cigarettes    Last attempt to quit: 06/19/1974    Years since quitting: 44.1  . Smokeless tobacco: Never Used  . Tobacco comment: Quit in late 20's  Substance Use Topics  . Alcohol use: No    Alcohol/week: 0.0 standard drinks    Comment: rare  . Drug use: No     Allergies   Azithromycin; Beta adrenergic blockers; Ciprofloxacin; Codeine; Darvon [propoxyphene]; Epinephrine; Klonopin [clonazepam]; Oxycodone; Paxil [paroxetine hydrochloride]; Prednisone; Propoxyphene hcl; Tape; Torsemide; Tramadol; Vicodin [hydrocodone-acetaminophen]; Meloxicam; and Vancomycin   Review of Systems Review of Systems  Constitutional: Negative for diaphoresis and fever.  Respiratory: Positive for shortness of breath and wheezing.   Cardiovascular: Negative for chest pain and palpitations.  Gastrointestinal: Negative for abdominal pain.  Skin: Negative for rash.  All other systems reviewed and are negative.    Physical Exam Updated Vital Signs BP (!) 101/58   Pulse 92   Temp 97.6 F (36.4 C) (Oral)   Resp 20   Ht $R'5\' 4"'OU$  (1.626 m)   Wt 136.1 kg   SpO2 97%   BMI 51.49 kg/m   Physical Exam Vitals signs and nursing note reviewed.  Constitutional:      Appearance: She is obese.  HENT:     Head: Normocephalic and atraumatic.     Nose: Nose normal.     Mouth/Throat:     Mouth: Mucous membranes are moist.     Pharynx: Oropharynx is clear.  Eyes:     Conjunctiva/sclera:  Conjunctivae normal.     Pupils: Pupils are equal, round, and reactive to light.  Neck:     Musculoskeletal: Normal range of motion and neck supple.  Cardiovascular:     Rate and Rhythm: Normal rate and regular rhythm.     Pulses: Normal pulses.     Heart sounds: Normal heart sounds.  Pulmonary:     Breath sounds: Wheezing present.  Abdominal:     General: Abdomen is flat. Bowel sounds are normal.     Tenderness: There is no abdominal tenderness. There is no guarding or rebound.  Musculoskeletal:        General: No tenderness.  Skin:    General: Skin is warm and dry.     Capillary Refill: Capillary refill takes less than 2 seconds.  Neurological:     General: No focal deficit present.     Mental Status: She is alert and oriented to person, place, and time.  Psychiatric:        Mood and Affect: Mood normal.      ED Treatments / Results  Labs (all labs ordered are listed, but only abnormal results are displayed) Results for orders placed or performed during the hospital encounter of 08/09/18  MRSA PCR Screening  Result Value Ref Range   MRSA by PCR NEGATIVE NEGATIVE  CBC with Differential/Platelet  Result Value Ref Range   WBC 7.3 4.0 - 10.5 K/uL   RBC 4.06 3.87 - 5.11 MIL/uL   Hemoglobin 12.2 12.0 - 15.0 g/dL   HCT 39.1 36.0 - 46.0 %   MCV 96.3 80.0 - 100.0 fL   MCH 30.0 26.0 - 34.0 pg   MCHC 31.2 30.0 - 36.0 g/dL   RDW 14.8 11.5 - 15.5 %   Platelets 129 (L) 150 - 400 K/uL   nRBC 0.0 0.0 - 0.2 %   Neutrophils Relative % 62 %   Neutro Abs 4.6 1.7 - 7.7  K/uL   Lymphocytes Relative 24 %   Lymphs Abs 1.7 0.7 - 4.0 K/uL   Monocytes Relative 8 %   Monocytes Absolute 0.6 0.1 - 1.0 K/uL   Eosinophils Relative 4 %   Eosinophils Absolute 0.3 0.0 - 0.5 K/uL   Basophils Relative 1 %   Basophils Absolute 0.1 0.0 - 0.1 K/uL   Immature Granulocytes 1 %   Abs Immature Granulocytes 0.06 0.00 - 0.07 K/uL  Basic metabolic panel  Result Value Ref Range   Sodium 138 135 - 145  mmol/L   Potassium 3.8 3.5 - 5.1 mmol/L   Chloride 99 98 - 111 mmol/L   CO2 30 22 - 32 mmol/L   Glucose, Bld 133 (H) 70 - 99 mg/dL   BUN 23 8 - 23 mg/dL   Creatinine, Ser 0.97 0.44 - 1.00 mg/dL   Calcium 8.1 (L) 8.9 - 10.3 mg/dL   GFR calc non Af Amer 53 (L) >60 mL/min   GFR calc Af Amer >60 >60 mL/min   Anion gap 9 5 - 15  Brain natriuretic peptide  Result Value Ref Range   B Natriuretic Peptide 73.0 0.0 - 100.0 pg/mL  Influenza panel by PCR (type A & B)  Result Value Ref Range   Influenza A By PCR NEGATIVE NEGATIVE   Influenza B By PCR NEGATIVE NEGATIVE  Troponin I - Now Then Q6H  Result Value Ref Range   Troponin I <0.03 <0.03 ng/mL  Troponin I - Now Then Q6H  Result Value Ref Range   Troponin I <0.03 <0.03 ng/mL  I-stat troponin, ED  Result Value Ref Range   Troponin i, poc 0.01 0.00 - 0.08 ng/mL   Comment 3           Ct Angio Chest Pe W And/or Wo Contrast  Result Date: 08/09/2018 CLINICAL DATA:  Short of breath EXAM: CT ANGIOGRAPHY CHEST WITH CONTRAST TECHNIQUE: Multidetector CT imaging of the chest was performed using the standard protocol during bolus administration of intravenous contrast. Multiplanar CT image reconstructions and MIPs were obtained to evaluate the vascular anatomy. CONTRAST:  <See Chart> ISOVUE-370 IOPAMIDOL (ISOVUE-370) INJECTION 76% COMPARISON:  04/23/2018 FINDINGS: Cardiovascular: There are no filling defects in the pulmonary arterial tree to suggest acute pulmonary thromboembolism. There is no obvious aortic dissection or acute intramural hematoma. Atherosclerotic calcifications of the aorta are noted. Mild LAD territory coronary artery calcification. Great vessels are patent. The heart is moderately enlarged and dilated. Mediastinum/Nodes: No abnormal mediastinal adenopathy. Thyroid is atrophic. No pericardial effusion. Esophagus is within normal limits. Lungs/Pleura: Low volumes and dependent atelectasis bilaterally. No pneumothorax or pleural effusion.  Upper Abdomen: Stable nonspecific low-density lesion in the spleen. There is most likely a hemangioma. Musculoskeletal: There is no vertebral compression deformity. Mild dextroscoliosis in the mid to lower thoracic spine. Bridging osteophytes are seen throughout the thoracic spine Review of the MIP images confirms the above findings. IMPRESSION: No evidence of acute pulmonary thromboembolism. Aortic Atherosclerosis (ICD10-I70.0). Electronically Signed   By: Marybelle Killings M.D.   On: 08/09/2018 07:39   Dg Chest Portable 1 View  Result Date: 08/09/2018 CLINICAL DATA:  Shortness of breath. EXAM: PORTABLE CHEST 1 VIEW COMPARISON:  07/05/2018.  05/06/2018. FINDINGS: Mediastinum stable. Heart size stable. Low lung volumes with right perihilar and bibasilar atelectasis. Tiny left pleural effusion can not be excluded. No pneumothorax. Postsurgical changes right breast. Degenerative changes scoliosis thoracic spine. IMPRESSION: Low lung volumes with right perihilar and bibasilar atelectasis. Tiny left pleural effusion can not  be excluded. Electronically Signed   By: Marcello Moores  Register   On: 08/09/2018 06:01   Vas [place] Lower Extremity Venous (dvt)  Result Date: 08/09/2018  Lower Venous Study Indications: Swelling.  Limitations: Body habitus. Performing Technologist: Abram Sander RVS  Examination Guidelines: A complete evaluation includes B-mode imaging, spectral Doppler, color Doppler, and power Doppler as needed of all accessible portions of each vessel. Bilateral testing is considered an integral part of a complete examination. Limited examinations for reoccurring indications may be performed as noted.  Right Venous Findings: +---------+---------------+---------+-----------+----------+--------------+          CompressibilityPhasicitySpontaneityPropertiesSummary        +---------+---------------+---------+-----------+----------+--------------+ CFV      Full           Yes      Yes                                  +---------+---------------+---------+-----------+----------+--------------+ SFJ      Full                                                        +---------+---------------+---------+-----------+----------+--------------+ FV Prox  Full                                                        +---------+---------------+---------+-----------+----------+--------------+ FV Mid   Full                                                        +---------+---------------+---------+-----------+----------+--------------+ FV DistalFull                                                        +---------+---------------+---------+-----------+----------+--------------+ PFV      Full                                                        +---------+---------------+---------+-----------+----------+--------------+ POP      Full           Yes      Yes                                 +---------+---------------+---------+-----------+----------+--------------+ PTV                                                   Not visualized +---------+---------------+---------+-----------+----------+--------------+ PERO  Not visualized +---------+---------------+---------+-----------+----------+--------------+  Left Venous Findings: +---------+---------------+---------+-----------+----------+--------------+          CompressibilityPhasicitySpontaneityPropertiesSummary        +---------+---------------+---------+-----------+----------+--------------+ CFV      Full           Yes      Yes                                 +---------+---------------+---------+-----------+----------+--------------+ SFJ      Full                                                        +---------+---------------+---------+-----------+----------+--------------+ FV Prox  Full                                                         +---------+---------------+---------+-----------+----------+--------------+ FV Mid   Full                                                        +---------+---------------+---------+-----------+----------+--------------+ FV Distal                                             Not visualized +---------+---------------+---------+-----------+----------+--------------+ PFV      Full                                                        +---------+---------------+---------+-----------+----------+--------------+ POP      Full           Yes      Yes                                 +---------+---------------+---------+-----------+----------+--------------+ PTV                                                   Not visualized +---------+---------------+---------+-----------+----------+--------------+ PERO                                                  Not visualized +---------+---------------+---------+-----------+----------+--------------+    Summary: Right: There is no evidence of deep vein thrombosis in the lower extremity. However, portions of this examination were limited- see technologist comments above. No cystic structure found in the popliteal fossa. Left: There is no evidence of deep vein thrombosis in the lower extremity. However, portions of this examination were limited-  see technologist comments above. No cystic structure found in the popliteal fossa.  *See table(s) above for measurements and observations. Electronically signed by Deitra Mayo MD on 08/09/2018 at 2:36:54 PM.    Final     EKG  EKG Interpretation  Date/Time:  Friday August 09 2018 05:37:33 EST Ventricular Rate:  95 PR Interval:    QRS Duration: 101 QT Interval:  356 QTC Calculation: 448 R Axis:   14 Text Interpretation:  Sinus rhythm Probable left atrial enlargement RSR' in V1 or V2, right VCD or RVH Confirmed by Randal Buba, Annajulia Lewing ([id number]) on 08/09/2018 5:51:20 AM       Radiology No  results found.  Procedures Procedures (including critical care time)  Medications Ordered in ED Medications - No data to display    Final Clinical Impressions(s) / ED Diagnoses  Signed out to Dr. Sedonia Small at change of shift    Holden, Yaniris Braddock, MD 08/09/18 2318

## 2018-08-09 NOTE — ED Notes (Signed)
Bed: VQ00 Expected date:  Expected time:  Means of arrival:  Comments: 17 F SOB from SNF improving with neb

## 2018-08-09 NOTE — ED Notes (Addendum)
Report called to [facility]. This RN made sure to alert Jarrett Soho RN that patient had just pulled out her IV by accident and will need a replacement once on the floor.   Also, As patient was being transported upstairs, she told the transporter that she is "allergic to polyester sheets. They cause me a rash." Patient reports she has not had any problem with [facility] sheets. This RN called and spoke to Manuela Schwartz on 5E to notify her of allergy and check the fabric of 5E sheets.

## 2018-08-09 NOTE — Progress Notes (Signed)
LCSW consulted for dc planning.   Patient from [facility].   No CSW needs if patient returns to independent living. Please submit new consult if CSW needs arise or dc plan changes.   Carolin Coy [facility] [place]

## 2018-08-09 NOTE — H&P (Signed)
[facility] History and Physical  Tamara Morales NUU:[id number] DOB: [birth year] DOA: 08/09/2018   PCP: Mast, Man X, NP  Specialists: Dr. Antoine Poche is her cardiologist although she has not seen them in a few years.  Chief Complaint: Shortness of breath and dizziness  HPI: Tamara Morales is a 85 y.o. female with a past medical history of chronic diastolic CHF, essential hypertension, hypothyroidism, vitamin B12 deficiency who lives in an independent living facility.  Mostly gets around using motorized chair however has been able to ambulate with the help of physical therapy recently.  She was hospitalized in November 2019 for chest pain.  She underwent a stress test which is thought to be low risk.  EF was noted to be normal.  Over the last 2 days patient has had some fatigue, has felt dizzy.  Denies any syncopal episodes.  Had had some chest tightness.  And then yesterday she started feeling short of breath.  She uses oxygen only at nighttime.  She sleeps in a recliner.  She just cannot get comfortable.  So she decided to come into the hospital for further evaluation.  She does admit to some nasal congestion.  Has had some ear pain recently for which she is going to see an ENT specialist soon.  Does admit to postnasal drip.  She does have leg swelling but they look better than previously.  In the emergency department patient was initially noted to have a low blood pressure in the 70s systolic.  Work-up otherwise did not reveal specific reason for her symptoms.  Influenza PCR was negative.  CT angiogram of the chest did not reveal any pulmonary embolism.  She will be observed in the hospital due to her borderline low blood pressures.  Home Medications: Prior to Admission medications   Medication Sig Start Date End Date Taking? Authorizing Provider  acetaminophen (TYLENOL) 325 MG tablet Take 650 mg by mouth every 8 (eight) hours as needed for mild pain or moderate pain.     [provider]  aspirin 81 MG tablet Take 81 mg by mouth daily.     [provider]  furosemide (LASIX) 40 MG tablet Take 1.5 tablets (60 mg total) by mouth daily. Along with metolazone and potassium supplement 11/20/17   Oneal Grout, MD  levothyroxine (SYNTHROID) 150 MCG tablet Take 150 mcg by mouth daily before breakfast.    [provider]  loratadine (CLARITIN) 10 MG tablet Take 10 mg by mouth daily as needed for allergies.     [provider]  losartan (COZAAR) 50 MG tablet TAKE 2 TABLETS ONCE A DAY. HOLD FOR SYSTOLIC BLOOD PRESSURE LESS [id number]. 08/01/18   Mast, Man X, NP  metolazone (ZAROXOLYN) 2.5 MG tablet TAKE 1 TABLET EACH DAY. 07/01/18   Mast, Man X, NP  omeprazole (PRILOSEC) 20 MG capsule TAKE 1 CAPSULE TWICE DAILY BEFORE MEALS 07/08/18   Mast, Man X, NP  ondansetron (ZOFRAN) 4 MG tablet TAKE (1) TABLET BY MOUTH EVERY SIX HOURS AS NEEDED FOR NAUSEA. Patient taking differently: Take 4 mg by mouth every 6 (six) hours as needed for nausea.  03/26/18   Mast, Man X, NP  potassium chloride (K-DUR,KLOR-CON) 10 MEQ tablet Take 20 mEq by mouth daily as needed. Take medication if 2-3 lb /24 hours or 3-5 lbs/ week is gained.    [provider]  potassium chloride SA (K-DUR,KLOR-CON) 20 MEQ tablet TAKE 3 TABLETS TWICE DAILY. 07/15/18   Mast, Man X, NP  psyllium (REGULOID)  0.52 g capsule Take 1.04 g by mouth daily as needed (for mild constipation).     [provider]  sertraline (ZOLOFT) 50 MG tablet Take 1.5 tablets (75 mg total) by mouth daily. 05/23/18 05/23/19  Mast, Man X, NP  vitamin B-12 (CYANOCOBALAMIN) 1000 MCG tablet Take 1 tablet (1,000 mcg total) by mouth daily. 05/24/18   Mast, Man X, NP    Allergies:  Allergies  Allergen Reactions  . Azithromycin Itching  . Beta Adrenergic Blockers Other (See Comments)    Depression   . Ciprofloxacin Other (See Comments)    Edgy and very jumpy    . Codeine Nausea And Vomiting  . Darvon [Propoxyphene]  Nausea And Vomiting  . Epinephrine Other (See Comments)    Rapid pulse, sweats  . Klonopin [Clonazepam] Other (See Comments)    Makes her feel very suicidal   . Oxycodone Other (See Comments)    Patient felt like it altered her mental status "Crazy" . Hallucinations later as well.  [name] Paxil [Paroxetine Hydrochloride] Other (See Comments)    Severe depression   . Prednisone Other (See Comments)    "makes me feel really bad"  . Propoxyphene Hcl Nausea And Vomiting  . Tape Itching  . Torsemide Other (See Comments)    Patient stated that she doesn't recall the reaction  . Tramadol Other (See Comments)    Feels "jittery"  . Vicodin [Hydrocodone-Acetaminophen] Other (See Comments)    Hallucinations  . Meloxicam Diarrhea  . Vancomycin Rash    Localized rash related to infusion rate    Past Medical History: Past Medical History:  Diagnosis Date  . ARTHRITIS, KNEES, BILATERAL   . Breast CA (HCC) 2000   s/p R lumpectomy and XRT  . DEPRESSION   . DJD (degenerative joint disease) of knee   . GERD   . HYPERLIPIDEMIA   . HYPERTENSION   . HYPOTHYROIDISM    postsurgical  . Hypoxia   . MIGRAINE HEADACHE   . Morbid obesity (HCC)   . OSA (obstructive sleep apnea) 01/17/2011 dx  . Oxygen dependent   . Urge incontinence   . UTI (lower urinary tract infection) 12/2014    Past Surgical History:  Procedure Laterality Date  . ABDOMINAL HYSTERECTOMY    . ADENOIDECTOMY    . BREAST BIOPSY  2000  . BREAST LUMPECTOMY  2000  . CATARACT EXTRACTION    . CHOLECYSTECTOMY  1995  . CYSTOSCOPY/URETEROSCOPY/HOLMIUM LASER Right 06/28/2015   Procedure: CYSTOSCOPY RIGHT RETROGRAD RIGHT URETEROSCOPY/HOLMIUM LASER WITH RIGHT STENT PLACEMENT;  Surgeon: Sebastian Ache, MD;  Location: [facility];  Service: Urology;  Laterality: Right;  . PARATHYROIDECTOMY     2-3 removed  . REPLACEMENT TOTAL KNEE BILATERAL  1999, 2005  . Right leg femur repaired    . THYROIDECTOMY    . TONSILLECTOMY    . TUBAL LIGATION       Social History: She lives in an independent living facility.  Quit smoking more than 30 years ago.  1 to 2 glasses of wine over the course of a week.   Family History:  Family History  Problem Relation Age of Onset  . Emphysema Sister   . Coronary artery disease Neg Hx      Review of Systems - History obtained from the patient General ROS: positive for  - fatigue Psychological ROS: negative Ophthalmic ROS: negative ENT ROS: negative Allergy and Immunology ROS: negative Hematological and Lymphatic ROS: negative Endocrine ROS: negative Respiratory ROS: as in hpi Cardiovascular ROS:  as in hpi Gastrointestinal ROS: no abdominal pain, change in bowel habits, or black or bloody stools Genito-Urinary ROS: no dysuria, trouble voiding, or hematuria Musculoskeletal ROS: negative Neurological ROS: no TIA or stroke symptoms Dermatological ROS: negative  Physical Examination  Vitals:   08/09/18 0640 08/09/18 0652 08/09/18 0721 08/09/18 0730  BP: 119/64  (!) 94/39   Pulse: 100 92 90   Resp: 16 16 14    Temp:    98 F (36.7 C)  TempSrc:    Oral  SpO2: 98% 95% 97%   Weight:      Height:        BP (!) 94/39   Pulse 90   Temp 98 F (36.7 C) (Oral)   Resp 14   Ht 5\' 4"  (1.626 m)   Wt 136.1 kg   SpO2 97%   BMI 51.49 kg/m   General appearance: alert, cooperative, appears stated age, no distress and morbidly obese Head: Normocephalic, without obvious abnormality, atraumatic Eyes: conjunctivae/corneas clear. PERRL, EOM's intact.  Ears: Cerumen noted in the right ear.  Tympanic membrane not fully visualized.  Left ear shows less wax than the right.  Tympanic membrane noted to be normal. Throat: lips, mucosa, and tongue normal; teeth and gums normal Neck: no adenopathy, no carotid bruit, no JVD, supple, symmetrical, trachea midline and thyroid not enlarged, symmetric, no tenderness/mass/nodules Resp: Normal effort.  Diminished air entry at the bases.  No wheezing rales or  rhonchi. Cardio: regular rate and rhythm, S1, S2 normal, no murmur, click, rub or gallop GI: soft, non-tender; bowel sounds normal; no masses,  no organomegaly Extremities: Nonpitting edema noted bilateral lower extremities.  Right more than left.  Chronic skin changes noted over the lower legs consistent with venous stasis dermatitis. Pulses: 2+ and symmetric Skin: Venous stasis dermatitis involving lower legs. Lymph nodes: Cervical, supraclavicular, and axillary nodes normal. Neurologic: She is alert and oriented x3.  No focal neurological deficits.    Labs on Admission: I have personally reviewed following labs and imaging studies  CBC: Recent Labs  Lab 08/09/18 0533  WBC 7.3  NEUTROABS 4.6  HGB 12.2  HCT 39.1  MCV 96.3  PLT 129*   Basic Metabolic Panel: Recent Labs  Lab 08/09/18 0533  NA 138  K 3.8  CL 99  CO2 30  GLUCOSE 133*  BUN 23  CREATININE 0.97  CALCIUM 8.1*   GFR: Estimated Creatinine Clearance: 58.4 mL/min (by C-G formula based on SCr of 0.97 mg/dL).   Radiological Exams on Admission: Ct Angio Chest Pe W And/or Wo Contrast  Result Date: 08/09/2018 CLINICAL DATA:  Short of breath EXAM: CT ANGIOGRAPHY CHEST WITH CONTRAST TECHNIQUE: Multidetector CT imaging of the chest was performed using the standard protocol during bolus administration of intravenous contrast. Multiplanar CT image reconstructions and MIPs were obtained to evaluate the vascular anatomy. CONTRAST:  <See Chart> ISOVUE-370 IOPAMIDOL (ISOVUE-370) INJECTION 76% COMPARISON:  04/23/2018 FINDINGS: Cardiovascular: There are no filling defects in the pulmonary arterial tree to suggest acute pulmonary thromboembolism. There is no obvious aortic dissection or acute intramural hematoma. Atherosclerotic calcifications of the aorta are noted. Mild LAD territory coronary artery calcification. Great vessels are patent. The heart is moderately enlarged and dilated. Mediastinum/Nodes: No abnormal mediastinal  adenopathy. Thyroid is atrophic. No pericardial effusion. Esophagus is within normal limits. Lungs/Pleura: Low volumes and dependent atelectasis bilaterally. No pneumothorax or pleural effusion. Upper Abdomen: Stable nonspecific low-density lesion in the spleen. There is most likely a hemangioma. Musculoskeletal: There is no vertebral compression  deformity. Mild dextroscoliosis in the mid to lower thoracic spine. Bridging osteophytes are seen throughout the thoracic spine Review of the MIP images confirms the above findings. IMPRESSION: No evidence of acute pulmonary thromboembolism. Aortic Atherosclerosis (ICD10-I70.0). Electronically Signed   By: Jolaine Click M.D.   On: 08/09/2018 07:39   Dg Chest Portable 1 View  Result Date: 08/09/2018 CLINICAL DATA:  Shortness of breath. EXAM: PORTABLE CHEST 1 VIEW COMPARISON:  07/05/2018.  05/06/2018. FINDINGS: Mediastinum stable. Heart size stable. Low lung volumes with right perihilar and bibasilar atelectasis. Tiny left pleural effusion can not be excluded. No pneumothorax. Postsurgical changes right breast. Degenerative changes scoliosis thoracic spine. IMPRESSION: Low lung volumes with right perihilar and bibasilar atelectasis. Tiny left pleural effusion can not be excluded. Electronically Signed   By: Maisie Fus  Register   On: 08/09/2018 06:01    My interpretation of Electrocardiogram: Sinus rhythm in the 80s.  Normal axis.  Intervals are normal.  No concerning ST or T wave changes.   Problem List  Principal Problem:   Dyspnea Active Problems:   Hypothyroidism (acquired)   Morbid obesity (HCC)   Post-nasal drip   Diabetes mellitus type 2 in obese (HCC)   Vitamin B12 deficiency   Assessment: This is a 85 year old Caucasian female with past medical history as discussed earlier who presents with shortness of breath, dizziness, lightheadedness and is found to be hypotensive in the emergency department.  Patient denies taking more Lasix than what she is  prescribed.  No recent changes to her antihypertensive regimen.  She does admit to a postnasal drip.  Plan:  1. Dyspnea: Etiology unclear.  CT angiogram chest unremarkable for acute findings. No pulmonary embolism or pulmonary edema.  There was some mention of atelectasis.  Postnasal drip could be contributing.  We will use Flonase nasal spray.  Last echocardiogram from September 2019 showed normal systolic function.  Mild valvular disease was noted.  She underwent a stress test in December which showed normal systolic function as well.  No clear indication to repeat an echocardiogram at this time. No pericardial effusion noted on CT angiogram.  No indication to initiate antibiotics at this time.  Influenza PCR negative.  She uses oxygen only in the nighttime.  Continue around the clock for now.  Will need to check room air saturations tomorrow.  She does have lower extremity edema which she mentions is better than previous.  She did have her right leg larger than left which could also be chronic.  We will do venous Doppler studies to rule out DVT.    2.  Hypotension in the setting of known essential hypertension and chronic diastolic CHF: No evidence for acute CHF at this time.  Reason for her low blood pressure is not entirely clear but could be due to medications that she is already on.  Does not report any change in the dose of her furosemide or antihypertensive recently.  We will hold all of these medications for now.  She does not appear to be symptomatic.  She did have an episode of lightheadedness 2 days ago but no syncopal episode.  Monitor her on telemetry for now.  No history of steroid use.  3.  Hypothyroidism: Continue home medications.  4.  Morbid obesity: BMI is 51.49.  This could also be contributing to her shortness of breath.  5.  Diabetes mellitus type 2 in obese patient: Not noted to be on any glucose lowering agents.  A1c was 6.5 in December.  Perhaps that  she is diet  controlled.  DVT Prophylaxis: Lovenox Code Status: DNR Family Communication: Discussed with the patient and her daughter Disposition: She is from an independent living facility.  Hopefully she can return there when medically improved.  PT will be consulted. Consults called: None Admission Status: Observation  Severity of Illness: The appropriate patient status for this patient is OBSERVATION. Observation status is judged to be reasonable and necessary in order to provide the required intensity of service to ensure the patient's safety. The patient's presenting symptoms, physical exam findings, and initial radiographic and laboratory data in the context of their medical condition is felt to place them at decreased risk for further clinical deterioration. Furthermore, it is anticipated that the patient will be medically stable for discharge from the hospital within 2 midnights of admission. The following factors support the patient status of observation.   " The patient's presenting symptoms include dyspnea, dizziness. " The physical exam findings include hypotension. " The initial radiographic and laboratory data are negative for PE.     Further management decisions will depend on results of further testing and patient's response to treatment.  Carleton [place] [facility]  [facility] [employer] on [email or web address]  08/09/2018, 8:50 AM

## 2018-08-09 NOTE — ED Provider Notes (Signed)
  Provider Note MRN:  [id number]  Arrival date & time: 08/09/18    ED Course and Medical Decision Making  I received sign out for this patient at shift change from Dr. Nicholes Stairs.  CTA with no evidence of pulmonary embolism, largely unremarkable, no pulmonary edema.  Patient continues to feel unwell, is using 2.5 to 3 L oxygen nasal cannula, baseline of 2.  Had an episode of shaking chills here in the ED, but no fever.  Has had return of soft blood pressures in the 74Q systolic.  Admitted to hospital service for observation.   EKG Interpretation  Date/Time:  Friday August 09 2018 07:26:51 EST Ventricular Rate:  89 PR Interval:    QRS Duration: 96 QT Interval:  376 QTC Calculation: 458 R Axis:   25 Text Interpretation:  Sinus rhythm Probable left atrial enlargement RSR' in V1 or V2, right VCD or RVH Confirmed by Gerlene Fee [phone]) on 08/09/2018 7:47:59 AM       Barth Kirks. [place] Small, [place] [email or web address]    Maudie Flakes, MD 08/09/18 [phone]

## 2018-08-09 NOTE — Progress Notes (Signed)
Lower extremity venous duplex has been completed.   Results given to RN.   Preliminary results in CV Proc.   Abram Sander 08/09/2018 10:01 AM

## 2018-08-10 DIAGNOSIS — E538 Deficiency of other specified B group vitamins: Secondary | ICD-10-CM

## 2018-08-10 DIAGNOSIS — R0982 Postnasal drip: Secondary | ICD-10-CM | POA: Diagnosis not present

## 2018-08-10 DIAGNOSIS — E669 Obesity, unspecified: Secondary | ICD-10-CM

## 2018-08-10 DIAGNOSIS — N183 Chronic kidney disease, stage 3 (moderate): Secondary | ICD-10-CM | POA: Diagnosis not present

## 2018-08-10 DIAGNOSIS — R0600 Dyspnea, unspecified: Secondary | ICD-10-CM | POA: Diagnosis not present

## 2018-08-10 DIAGNOSIS — E039 Hypothyroidism, unspecified: Secondary | ICD-10-CM | POA: Diagnosis not present

## 2018-08-10 DIAGNOSIS — E1169 Type 2 diabetes mellitus with other specified complication: Secondary | ICD-10-CM

## 2018-08-10 LAB — COMPREHENSIVE METABOLIC PANEL
ALT: 11 U/L (ref 0–44)
AST: 21 U/L (ref 15–41)
Albumin: 2.8 g/dL — ABNORMAL LOW (ref 3.5–5.0)
Alkaline Phosphatase: 79 U/L (ref 38–126)
Anion gap: 8 (ref 5–15)
BUN: 10 mg/dL (ref 8–23)
CHLORIDE: 100 mmol/L (ref 98–111)
CO2: 31 mmol/L (ref 22–32)
Calcium: 7.5 mg/dL — ABNORMAL LOW (ref 8.9–10.3)
Creatinine, Ser: 0.75 mg/dL (ref 0.44–1.00)
Glucose, Bld: 103 mg/dL — ABNORMAL HIGH (ref 70–99)
Potassium: 4.1 mmol/L (ref 3.5–5.1)
Sodium: 139 mmol/L (ref 135–145)
Total Bilirubin: 0.9 mg/dL (ref 0.3–1.2)
Total Protein: 5.7 g/dL — ABNORMAL LOW (ref 6.5–8.1)

## 2018-08-10 LAB — CBC
HCT: 38.6 % (ref 36.0–46.0)
Hemoglobin: 11.5 g/dL — ABNORMAL LOW (ref 12.0–15.0)
MCH: 29.3 pg (ref 26.0–34.0)
MCHC: 29.8 g/dL — ABNORMAL LOW (ref 30.0–36.0)
MCV: 98.2 fL (ref 80.0–100.0)
NRBC: 0 % (ref 0.0–0.2)
Platelets: 133 10*3/uL — ABNORMAL LOW (ref 150–400)
RBC: 3.93 MIL/uL (ref 3.87–5.11)
RDW: 14.6 % (ref 11.5–15.5)
WBC: 5.4 10*3/uL (ref 4.0–10.5)

## 2018-08-10 MED ORDER — FLUTICASONE PROPIONATE 50 MCG/ACT NA SUSP: 2.0000 | Freq: Every day | NASAL | 0 refills | Status: DC

## 2018-08-10 MED ORDER — CYANOCOBALAMIN 1000 MCG/ML IJ SOLN
1000.0000 ug | Freq: Once | INTRAMUSCULAR | Status: AC
  Administered 2018-08-10: 1000 ug via INTRAMUSCULAR
  Filled 2018-08-10: qty 1

## 2018-08-10 MED ORDER — FUROSEMIDE 40 MG PO TABS: 40.0000 mg | ORAL_TABLET | Freq: Every day | ORAL | Status: DC

## 2018-08-10 NOTE — Evaluation (Signed)
Physical Therapy Evaluation Patient Details Name: Tamara Morales MRN: [id number] DOB: 03-Nov-1930 Today's Date: 08/10/2018   History of Present Illness   Tamara Morales is a 85 y.o. female with a past medical history of chronic diastolic CHF, essential hypertension, hypothyroidism, vitamin B12 deficiency admitted with SOB and was found to have some hypotension.  Clinical Impression  Pt admitted with above diagnosis. Pt currently with functional limitations due to the deficits listed below (see PT Problem List). Pt will benefit from skilled PT to increase their independence and safety with mobility to allow discharge to the venue listed below.  Pt did fairly well with SPT with cane, but with o2 intact.  Deferred gait due to fatigue. Recommend HHPT for continued gait training in her apartment.  Will follow acutely.     Follow Up Recommendations Home health PT    Equipment Recommendations  None recommended by PT    Recommendations for Other Services       Precautions / Restrictions Precautions Precautions: Fall Restrictions Weight Bearing Restrictions: No      Mobility  Bed Mobility Overal bed mobility: Modified Independent             General bed mobility comments: HOB elevated and use of rail (sleeps in recliner at home)  Transfers Overall transfer level: Needs assistance Equipment used: Straight cane Transfers: Sit to/from [employer] Transfers Sit to Stand: Supervision Stand pivot transfers: Min guard       General transfer comment: S with sit >stand for steadying, min/guard for SPT with cues to fully turn prior to sitting  Ambulation/Gait             General Gait Details: deferred  Stairs            Wheelchair Mobility    Modified Rankin (Stroke Patients Only)       Balance Overall balance assessment: Needs assistance   Sitting balance-Leahy Scale: Good       Standing balance-Leahy Scale: Poor Standing balance comment:  requires UE support                             Pertinent Vitals/Pain Pain Assessment: No/denies pain    Home Living Family/patient expects to be discharged to:: Other (Comment)(Independent [facility] at [facility])     Type of Home: Independent living facility       Home Layout: One level Home Equipment: Environmental consultant - 2 wheels;Cane - single point;Electric scooter;Grab bars - toilet;Grab bars - tub/shower Additional Comments[facility], sleeps in recliner and wears o2 at night    Prior Function Level of Independence: Independent with assistive device(s)         Comments: primarily uses motorized chair, but will walk short distances in her apartment with 2 canes     Hand Dominance        Extremity/Trunk Assessment   Upper Extremity Assessment Upper Extremity Assessment: Overall WFL for tasks assessed    Lower Extremity Assessment Lower Extremity Assessment: Overall WFL for tasks assessed       Communication   Communication: No difficulties  Cognition Arousal/Alertness: Awake/alert Behavior During Therapy: WFL for tasks assessed/performed Overall Cognitive Status: Within Functional Limits for tasks assessed                                        General Comments  Exercises     Assessment/Plan    PT Assessment Patient needs continued PT services  PT Problem List Decreased activity tolerance;Decreased balance;Decreased mobility;Cardiopulmonary status limiting activity       PT Treatment Interventions DME instruction;Gait training;Functional mobility training;Therapeutic activities;Therapeutic exercise;Balance training    PT Goals (Current goals can be found in the Care Plan section)  Acute Rehab PT Goals Patient Stated Goal: Return home PT Goal Formulation: With patient Time For Goal Achievement: 08/24/18 Potential to Achieve Goals: Good    Frequency Min 3X/week   Barriers to discharge        Co-evaluation                AM-PAC PT "6 Clicks" Mobility  Outcome Measure Help needed turning from your back to your side while in a flat bed without using bedrails?: A Lot Help needed moving from lying on your back to sitting on the side of a flat bed without using bedrails?: A Lot Help needed moving to and from a bed to a chair (including a wheelchair)?: A Little Help needed standing up from a chair using your arms (e.g., wheelchair or bedside chair)?: None Help needed to walk in hospital room?: A Little Help needed climbing 3-5 steps with a railing? : A Lot 6 Click Score: 16    End of Session Equipment Utilized During Treatment: Oxygen Activity Tolerance: Patient tolerated treatment well;Patient limited by fatigue Patient left: in chair;with call bell/phone within reach;with chair alarm set Nurse Communication: Mobility status(nurse tech) PT Visit Diagnosis: Difficulty in walking, not elsewhere classified (R26.2);Unsteadiness on feet (R26.81)    Time: 2841-3244 PT Time Calculation (min) (ACUTE ONLY): 26 min   Charges:   PT Evaluation $PT Eval Low Complexity: 1 Low PT Treatments $Therapeutic Activity: 8-22 mins        Tamara Morales, [place] Pager [phone] 08/10/2018   Tamara Morales 08/10/2018, 9:41 AM

## 2018-08-10 NOTE — Evaluation (Signed)
Occupational Therapy Evaluation Patient Details Name: Tamara Morales MRN: [id number] DOB: 23-Aug-1930 Today's Date: 08/10/2018    History of Present Illness  Tamara Morales is a 85 y.o. female with a past medical history of chronic diastolic CHF, essential hypertension, hypothyroidism, vitamin B12 deficiency admitted with SOB and was found to have some hypotension.   Clinical Impression   OT eval and education complete    Follow Up Recommendations  Home health OT;Supervision - Intermittent    Equipment Recommendations  None recommended by OT    Recommendations for Other Services       Precautions / Restrictions        Mobility Bed Mobility Overal bed mobility: Modified Independent                Transfers Overall transfer level: Modified independent                    Balance Overall balance assessment: Needs assistance   Sitting balance-Leahy Scale: Good       Standing balance-Leahy Scale: Poor Standing balance comment: requires UE support                           ADL either performed or assessed with clinical judgement   ADL Overall ADL's : Needs assistance/impaired;At baseline                                       General ADL Comments: pt feels she is at baseline with ADL activity.  Discussed safety with ADL activity.  Pt verbalized understanding.  Pts daughter will A with bathing initially     Vision Patient Visual Report: No change from baseline       Perception     Praxis      Pertinent Vitals/Pain Pain Assessment: No/denies pain     Hand Dominance     Extremity/Trunk Assessment Upper Extremity Assessment Upper Extremity Assessment: Overall WFL for tasks assessed           Communication Communication Communication: No difficulties   Cognition Arousal/Alertness: Awake/alert Behavior During Therapy: WFL for tasks assessed/performed Overall Cognitive Status: Within Functional Limits for  tasks assessed                                                Home Living Family/patient expects to be discharged to:: Other (Comment)(Independent living at [facility])     Type of Home: Independent living facility       Home Layout: One level     Bathroom Shower/Tub: Walk-in shower         Home Equipment: Environmental consultant - 2 wheels;Cane - single point;Electric scooter;Grab bars - toilet;Grab bars - tub/shower   Additional Comments[facility], sleeps in recliner and wears o2 at night      Prior Functioning/Environment Level of Independence: Independent with assistive device(s)        Comments: primarily uses motorized chair, but will walk short distances in her apartment with 2 canes        OT Problem List: Decreased strength;Obesity      OT Treatment/Interventions:      OT Goals(Current goals can be found in the care plan section) Acute Rehab OT Goals  Patient Stated Goal: Return home  OT Frequency:      AM-PAC OT "6 Clicks" Daily Activity     Outcome Measure Help from another person eating meals?: None Help from another person taking care of personal grooming?: None Help from another person toileting, which includes using toliet, bedpan, or urinal?: None Help from another person bathing (including washing, rinsing, drying)?: A Little Help from another person to put on and taking off regular upper body clothing?: None Help from another person to put on and taking off regular lower body clothing?: None 6 Click Score: 23   End of Session Nurse Communication: Mobility status  Activity Tolerance: Patient tolerated treatment well Patient left: in chair;with call bell/phone within reach;with family/visitor present  OT Visit Diagnosis: Muscle weakness (generalized) (M62.81)                Time: 1220-1240 OT Time Calculation (min): 20 min Charges:  OT General Charges $OT Visit: 1 Visit OT Evaluation $OT Eval Low Complexity: 1  Low  Lise Auer, OT Acute Rehabilitation Services [phone] Office- [phone]     Dynasty Holquin, Karin Golden D 08/10/2018, 3:37 PM

## 2018-08-10 NOTE — Progress Notes (Signed)
Patient has discharged to [facility] on 08/10/2018. Discharge instruction including medication and appointment was given to patient. Patient called her daughter to update about her discharge today. Patient has no question at this time.

## 2018-08-10 NOTE — Discharge Summary (Signed)
Physician Discharge Summary  [name] STM:[id number] DOB: [birth year] DOA: 08/09/2018  PCP: Marda Stalker, Man X, NP  Admit date: 08/09/2018 Discharge date: 08/10/2018  Ad[facility] From[facility]   Recommendations for Outpatient Follow-up:  1. Follow up with PCP in 1-2 weeks 2. Recommend reestablishing care with cardiology, Dr. Percival Spanish  Home Health: PT, OT Equipment/Devices: None new, continue cane Discharge Condition: Stable CODE STATUS: DNR Diet recommendation: Heart healthy  Brief/Interim Summary: Tamara Morales is an 85 y.o. female with a past medical history of chronic diastolic CHF, essential hypertension, hypothyroidism, and vitamin B12 deficiency who lives in an independent living facility.  Mostly gets around using motorized chair however has been able to ambulate with the help of physical therapy recently.  She was hospitalized in November 2019 for chest pain.  She underwent a stress test which is thought to be low risk.  EF was noted to be normal.  Over the last 2 days patient has had some fatigue, has felt dizzy.  Denies any syncopal episodes.  Had had some chest tightness.  And then yesterday she started feeling short of breath.  She uses oxygen only at nighttime.  She sleeps in a recliner.  She just cannot get comfortable.  So she decided to come into the hospital for further evaluation.  She does admit to some nasal congestion.  Has had some ear pain recently for which she is going to see an ENT specialist soon.  Does admit to postnasal drip.  She does have leg swelling but they look better than previously.  In the emergency department patient was initially noted to have a low blood pressure.  Work-up otherwise did not reveal specific reason for her symptoms.  Influenza PCR was negative.  CT angiogram of the chest did not reveal any pulmonary embolism, infiltrate, or pulmonary edema. There was some atelectasis.  She was observed in the hospital due  to her borderline low blood pressures, BP medications have been held and she has maintained blood pressure with MAP >70. The following morning she is breathing better, having no dyspnea at rest, and mild dyspnea when working with PT. Home health PT was recommended. She feels better and is stable for discharge.  Discharge Diagnoses:  Principal Problem:   Dyspnea Active Problems:   Hypothyroidism (acquired)   Morbid obesity (HCC)   CKD (chronic kidney disease) stage 3, GFR 30-59 ml/min (HCC)   Post-nasal drip   Diabetes mellitus type 2 in obese (HCC)   Vitamin B12 deficiency  Dyspnea, chronic hypoxic respiratory failure: On nocturnal O2 at baseline. Etiology unclear, though suspect obesity, deconditioning, and atelectasis contributing.  Influenza PCR negative. CTA chest as below. - Mobilize as able, given incentive spirometer and instructed on use.  - ?URI symptoms contributing, continue trial flonase nasal spray   She does have lower extremity edema which she mentions is better than previous.  She did have her right leg larger than left which could also be chronic.  We will do venous Doppler studies to rule out DVT.    Hypotension in the setting of known essential hypertension and chronic diastolic CHF: No evidence for acute CHF at this time.  Reports low-normal BPs at baseline, occasionally holds valsartan when SBP < 110. No symptoms of hypotension reported. No pericardial effusion on CT. - Will recommend holding valsartan until PCP and/or cardiology follow up. Had preserved EF at last check. - Lower extremity edema is improved from previous.  Hypothyroidism: Recent TSH 5.4 down from  previous with normal free T4 - Continue stable dose synthroid.  Morbid obesity: BMI is 51.49.  This could also be contributing to her shortness of breath. - Weight loss recommended.  Diabetes mellitus type 2 in obese patient: Not noted to be on any glucose lowering agents.  A1c was 6.5 in December.  -  Follow up with PCP.  Vitamin B12 deficiency:  - Given IM, continue po.  Discharge Instructions Discharge Instructions    Diet - low sodium heart healthy   Complete by:  As directed    Diet Carb Modified   Complete by:  As directed    Discharge instructions   Complete by:  As directed    You were admitted for hypotension and shortness of breath. Low blood pressure has improved and it is recommended that you continue to hold valsartan until you follow up with your doctor. You may also benefit from re-establishing care with cardiology, Dr. Percival Spanish.   The cause of your shortness of breath is likely related to deconditioning and atelectasis. This should improve with ongoing physical therapy and using the incentive spirometer.   If your symptoms return, seek medical attention, otherwise follow up with your PCP in the next week.   Increase activity slowly   Complete by:  As directed      Allergies as of 08/10/2018      Reactions   Azithromycin Itching   Beta Adrenergic Blockers Other (See Comments)   Depression   Ciprofloxacin Other (See Comments)   Edgy and very jumpy    Codeine Nausea And Vomiting   Darvon [propoxyphene] Nausea And Vomiting   Epinephrine Other (See Comments)   Rapid pulse, sweats   Klonopin [clonazepam] Other (See Comments)   Makes her feel very suicidal    Oxycodone Other (See Comments)   Patient felt like it altered her mental status "Crazy" . Hallucinations later as well.   Paxil [paroxetine Hydrochloride] Other (See Comments)   Severe depression    Prednisone Other (See Comments)   "makes me feel really bad"   Propoxyphene Hcl Nausea And Vomiting   Tape Itching   Torsemide Other (See Comments)   Patient stated that she doesn't recall the reaction   Tramadol Other (See Comments)   Feels "jittery"   Vicodin [hydrocodone-acetaminophen] Other (See Comments)   Hallucinations   Meloxicam Diarrhea   Other Rash   Polyester sheets "cause me a rash"    Vancomycin Rash   Localized rash related to infusion rate      Medication List    STOP taking these medications   losartan 50 MG tablet Commonly known as:  COZAAR     TAKE these medications   acetaminophen 325 MG tablet Commonly known as:  TYLENOL Take 650 mg by mouth every 8 (eight) hours as needed for mild pain or moderate pain.   aspirin 81 MG tablet Take 81 mg by mouth daily.   fluticasone 50 MCG/ACT nasal spray Commonly known as:  FLONASE Place 2 sprays into both nostrils daily for 7 days.   furosemide 40 MG tablet Commonly known as:  LASIX Take 1 tablet (40 mg total) by mouth daily. Along with metolazone and potassium supplement   metolazone 2.5 MG tablet Commonly known as:  ZAROXOLYN TAKE 1 TABLET EACH DAY. What changed:  See the new instructions.   omeprazole 20 MG capsule Commonly known as:  PRILOSEC TAKE 1 CAPSULE TWICE DAILY BEFORE MEALS What changed:  See the new instructions.   ondansetron 4 MG  tablet Commonly known as:  ZOFRAN TAKE (1) TABLET BY MOUTH EVERY SIX HOURS AS NEEDED FOR NAUSEA. What changed:  See the new instructions.   potassium chloride SA 20 MEQ tablet Commonly known as:  K-DUR,KLOR-CON TAKE 3 TABLETS TWICE DAILY. What changed:  See the new instructions.   psyllium 0.52 g capsule Commonly known as:  REGULOID Take 1.04 g by mouth daily as needed (for mild constipation).   sertraline 50 MG tablet Commonly known as:  ZOLOFT Take 1.5 tablets (75 mg total) by mouth daily. What changed:    how much to take  when to take this   SYNTHROID 150 MCG tablet Generic drug:  levothyroxine Take 150 mcg by mouth daily before breakfast.   vitamin B-12 1000 MCG tablet Commonly known as:  CYANOCOBALAMIN Take 1 tablet (1,000 mcg total) by mouth daily.      Follow-up Information    Mast, Man X, NP. Schedule an appointment as soon as possible for a visit.   Specialty:  Internal Medicine Contact information: [address] [place]  [place] [address] [phone]        Rollene Rotunda, MD .   Specialty:  Cardiology Contact information: [address] [place] [address] [phone]          Allergies  Allergen Reactions  . Azithromycin Itching  . Beta Adrenergic Blockers Other (See Comments)    Depression   . Ciprofloxacin Other (See Comments)    Edgy and very jumpy    . Codeine Nausea And Vomiting  . Darvon [Propoxyphene] Nausea And Vomiting  . Epinephrine Other (See Comments)    Rapid pulse, sweats  . Klonopin [Clonazepam] Other (See Comments)    Makes her feel very suicidal   . Oxycodone Other (See Comments)    Patient felt like it altered her mental status "Crazy" . Hallucinations later as well.  [name] Paxil [Paroxetine Hydrochloride] Other (See Comments)    Severe depression   . Prednisone Other (See Comments)    "makes me feel really bad"  . Propoxyphene Hcl Nausea And Vomiting  . Tape Itching  . Torsemide Other (See Comments)    Patient stated that she doesn't recall the reaction  . Tramadol Other (See Comments)    Feels "jittery"  . Vicodin [Hydrocodone-Acetaminophen] Other (See Comments)    Hallucinations  . Meloxicam Diarrhea  . Other Rash    Polyester sheets "cause me a rash"  . Vancomycin Rash    Localized rash related to infusion rate    Consultations:  None  Procedures/Studies: Ct Angio Chest Pe W And/or Wo Contrast  Result Date: 08/09/2018 CLINICAL DATA:  Short of breath EXAM: CT ANGIOGRAPHY CHEST WITH CONTRAST TECHNIQUE: Multidetector CT imaging of the chest was performed using the standard protocol during bolus administration of intravenous contrast. Multiplanar CT image reconstructions and MIPs were obtained to evaluate the vascular anatomy. CONTRAST:  <See Chart> ISOVUE-370 IOPAMIDOL (ISOVUE-370) INJECTION 76% COMPARISON:  04/23/2018 FINDINGS: Cardiovascular: There are no filling defects in the pulmonary arterial tree to suggest acute pulmonary thromboembolism.  There is no obvious aortic dissection or acute intramural hematoma. Atherosclerotic calcifications of the aorta are noted. Mild LAD territory coronary artery calcification. Great vessels are patent. The heart is moderately enlarged and dilated. Mediastinum/Nodes: No abnormal mediastinal adenopathy. Thyroid is atrophic. No pericardial effusion. Esophagus is within normal limits. Lungs/Pleura: Low volumes and dependent atelectasis bilaterally. No pneumothorax or pleural effusion. Upper Abdomen: Stable nonspecific low-density lesion in the spleen. There is most likely a  hemangioma. Musculoskeletal: There is no vertebral compression deformity. Mild dextroscoliosis in the mid to lower thoracic spine. Bridging osteophytes are seen throughout the thoracic spine Review of the MIP images confirms the above findings. IMPRESSION: No evidence of acute pulmonary thromboembolism. Aortic Atherosclerosis (ICD10-I70.0). Electronically Signed   By: Marybelle Killings M.D.   On: 08/09/2018 07:39   Dg Chest Portable 1 View  Result Date: 08/09/2018 CLINICAL DATA:  Shortness of breath. EXAM: PORTABLE CHEST 1 VIEW COMPARISON:  07/05/2018.  05/06/2018. FINDINGS: Mediastinum stable. Heart size stable. Low lung volumes with right perihilar and bibasilar atelectasis. Tiny left pleural effusion can not be excluded. No pneumothorax. Postsurgical changes right breast. Degenerative changes scoliosis thoracic spine. IMPRESSION: Low lung volumes with right perihilar and bibasilar atelectasis. Tiny left pleural effusion can not be excluded. Electronically Signed   By: Marcello Moores  Register   On: 08/09/2018 06:01   Vas [place] Lower Extremity Venous (dvt)  Result Date: 08/09/2018  Lower Venous Study Indications: Swelling.  Limitations: Body habitus. Performing Technologist: Abram Sander RVS  Examination Guidelines: A complete evaluation includes B-mode imaging, spectral Doppler, color Doppler, and power Doppler as needed of all accessible portions of each  vessel. Bilateral testing is considered an integral part of a complete examination. Limited examinations for reoccurring indications may be performed as noted.  Right Venous Findings: +---------+---------------+---------+-----------+----------+--------------+          CompressibilityPhasicitySpontaneityPropertiesSummary        +---------+---------------+---------+-----------+----------+--------------+ CFV      Full           Yes      Yes                                 +---------+---------------+---------+-----------+----------+--------------+ SFJ      Full                                                        +---------+---------------+---------+-----------+----------+--------------+ FV Prox  Full                                                        +---------+---------------+---------+-----------+----------+--------------+ FV Mid   Full                                                        +---------+---------------+---------+-----------+----------+--------------+ FV DistalFull                                                        +---------+---------------+---------+-----------+----------+--------------+ PFV      Full                                                        +---------+---------------+---------+-----------+----------+--------------+  POP      Full           Yes      Yes                                 +---------+---------------+---------+-----------+----------+--------------+ PTV                                                   Not visualized +---------+---------------+---------+-----------+----------+--------------+ PERO                                                  Not visualized +---------+---------------+---------+-----------+----------+--------------+  Left Venous Findings: +---------+---------------+---------+-----------+----------+--------------+          CompressibilityPhasicitySpontaneityPropertiesSummary         +---------+---------------+---------+-----------+----------+--------------+ CFV      Full           Yes      Yes                                 +---------+---------------+---------+-----------+----------+--------------+ SFJ      Full                                                        +---------+---------------+---------+-----------+----------+--------------+ FV Prox  Full                                                        +---------+---------------+---------+-----------+----------+--------------+ FV Mid   Full                                                        +---------+---------------+---------+-----------+----------+--------------+ FV Distal                                             Not visualized +---------+---------------+---------+-----------+----------+--------------+ PFV      Full                                                        +---------+---------------+---------+-----------+----------+--------------+ POP      Full           Yes      Yes                                 +---------+---------------+---------+-----------+----------+--------------+ PTV  Not visualized +---------+---------------+---------+-----------+----------+--------------+ PERO                                                  Not visualized +---------+---------------+---------+-----------+----------+--------------+    Summary: Right: There is no evidence of deep vein thrombosis in the lower extremity. However, portions of this examination were limited- see technologist comments above. No cystic structure found in the popliteal fossa. Left: There is no evidence of deep vein thrombosis in the lower extremity. However, portions of this examination were limited- see technologist comments above. No cystic structure found in the popliteal fossa.  *See table(s) above for measurements and observations.  Electronically signed by Deitra Mayo MD on 08/09/2018 at 2:36:54 PM.    Final     Subjective: Feels breathing is much better, no chest pain or palpitations.   Discharge Exam: Vitals:   08/10/18 0544 08/10/18 0844  BP: 108/75 (!) 119/54  Pulse: 88 79  Resp: 20 20  Temp: 98.1 F (36.7 C) 97.7 F (36.5 C)  SpO2: 98% 98%   General: Pt is alert, awake, not in acute distress Cardiovascular: RRR, S1/S2 +, no rubs, no gallops Respiratory: Nonlabored, clear bilaterally Abdominal: Soft, NT, ND, bowel sounds + Extremities: Nonpitting R > L LE woody edema with hyperpigmentation without tenderness, induration or fluctuance.   Labs: BNP (last 3 results) Recent Labs    04/23/18 1519 07/05/18 1248 08/09/18 0534  BNP 97.3 66.3 35.0   Basic Metabolic Panel: Recent Labs  Lab 08/09/18 0533 08/10/18 0535  NA 138 139  K 3.8 4.1  CL 99 100  CO2 30 31  GLUCOSE 133* 103*  BUN 23 10  CREATININE 0.97 0.75  CALCIUM 8.1* 7.5*   Liver Function Tests: Recent Labs  Lab 08/10/18 0535  AST 21  ALT 11  ALKPHOS 79  BILITOT 0.9  PROT 5.7*  ALBUMIN 2.8*   No results for input(s): LIPASE, AMYLASE in the last 168 hours. No results for input(s): AMMONIA in the last 168 hours. CBC: Recent Labs  Lab 08/09/18 0533 08/10/18 0621  WBC 7.3 5.4  NEUTROABS 4.6  --   HGB 12.2 11.5*  HCT 39.1 38.6  MCV 96.3 98.2  PLT 129* 133*   Cardiac Enzymes: Recent Labs  Lab 08/09/18 1319 08/09/18 1824  TROPONINI <0.03 <0.03   BNP: Invalid input(s): POCBNP CBG: No results for input(s): GLUCAP in the last 168 hours. D-Dimer No results for input(s): DDIMER in the last 72 hours. Hgb A1c No results for input(s): HGBA1C in the last 72 hours. Lipid Profile No results for input(s): CHOL, HDL, LDLCALC, TRIG, CHOLHDL, LDLDIRECT in the last 72 hours. Thyroid function studies No results for input(s): TSH, T4TOTAL, T3FREE, THYROIDAB in the last 72 hours.  Invalid input(s): FREET3 Anemia work  up No results for input(s): VITAMINB12, FOLATE, FERRITIN, TIBC, IRON, RETICCTPCT in the last 72 hours. Urinalysis    Component Value Date/Time   COLORURINE STRAW (A) 03/14/2018 1836   APPEARANCEUR CLEAR 03/14/2018 1836   LABSPEC 1.008 03/14/2018 1836   PHURINE 5.0 03/14/2018 1836   GLUCOSEU NEGATIVE 03/14/2018 1836   GLUCOSEU NEGATIVE 04/17/2012 1232   HGBUR NEGATIVE 03/14/2018 1836   BILIRUBINUR NEGATIVE 03/14/2018 1836   BILIRUBINUR neg 12/22/2016 1124   KETONESUR NEGATIVE 03/14/2018 1836   PROTEINUR NEGATIVE 03/14/2018 1836   UROBILINOGEN negative (A) 12/22/2016 1124   UROBILINOGEN 0.2 01/07/2015 1751  NITRITE NEGATIVE 03/14/2018 1836   LEUKOCYTESUR MODERATE (A) 03/14/2018 1836    Microbiology Recent Results (from the past 240 hour(s))  MRSA PCR Screening     Status: None   Collection Time: 08/09/18  3:15 PM  Result Value Ref Range Status   MRSA by PCR NEGATIVE NEGATIVE Final    Comment:        The GeneXpert MRSA Assay (FDA approved for NASAL specimens only), is one component of a comprehensive MRSA colonization surveillance program. It is not intended to diagnose MRSA infection nor to guide or monitor treatment for MRSA infections. Performed at [facility], [place] [address] [place], [place] [address]     Time coordinating discharge: Approximately 40 minutes  Patrecia Pour, MD  [facility] 08/10/2018, 11:15 AM Pager [phone]

## 2018-08-13 DIAGNOSIS — H6121 Impacted cerumen, right ear: Secondary | ICD-10-CM | POA: Diagnosis not present

## 2018-08-13 DIAGNOSIS — H9011 Conductive hearing loss, unilateral, right ear, with unrestricted hearing on the contralateral side: Secondary | ICD-10-CM | POA: Diagnosis not present

## 2018-08-14 ENCOUNTER — Other Ambulatory Visit: Payer: Self-pay | Admitting: Nurse Practitioner

## 2018-08-15 ENCOUNTER — Encounter: Payer: Self-pay | Admitting: Nurse Practitioner

## 2018-08-15 ENCOUNTER — Non-Acute Institutional Stay: Payer: Medicare Other | Admitting: Nurse Practitioner

## 2018-08-15 DIAGNOSIS — I5033 Acute on chronic diastolic (congestive) heart failure: Secondary | ICD-10-CM | POA: Diagnosis not present

## 2018-08-15 DIAGNOSIS — F419 Anxiety disorder, unspecified: Secondary | ICD-10-CM | POA: Diagnosis not present

## 2018-08-15 DIAGNOSIS — E039 Hypothyroidism, unspecified: Secondary | ICD-10-CM

## 2018-08-15 DIAGNOSIS — K219 Gastro-esophageal reflux disease without esophagitis: Secondary | ICD-10-CM | POA: Diagnosis not present

## 2018-08-15 DIAGNOSIS — I890 Lymphedema, not elsewhere classified: Secondary | ICD-10-CM

## 2018-08-15 DIAGNOSIS — E538 Deficiency of other specified B group vitamins: Secondary | ICD-10-CM

## 2018-08-15 DIAGNOSIS — I10 Essential (primary) hypertension: Secondary | ICD-10-CM

## 2018-08-15 DIAGNOSIS — R0982 Postnasal drip: Secondary | ICD-10-CM | POA: Diagnosis not present

## 2018-08-15 DIAGNOSIS — E669 Obesity, unspecified: Secondary | ICD-10-CM

## 2018-08-15 DIAGNOSIS — E662 Morbid (severe) obesity with alveolar hypoventilation: Secondary | ICD-10-CM

## 2018-08-15 DIAGNOSIS — E1169 Type 2 diabetes mellitus with other specified complication: Secondary | ICD-10-CM | POA: Diagnosis not present

## 2018-08-15 NOTE — Patient Instructions (Signed)
The patient is advised to take  medication as directed, call back if symptoms of SOB, cough, dizziness, chest pain/pressure, palpitation, or other new symptoms develop.

## 2018-08-15 NOTE — Assessment & Plan Note (Signed)
[place] BLE was negative for DVT, no open wounds, chronic, continue Furosemide 40mg  qd, Metolazone 2.5mg  qd.

## 2018-08-15 NOTE — Assessment & Plan Note (Signed)
Continue Levothyroxine 143mcg qd, last TSH 5.4 07/18/18. Pending TSH f/u in 12 weeks since 07/18/18

## 2018-08-15 NOTE — Assessment & Plan Note (Signed)
Continue O2 at night.

## 2018-08-15 NOTE — Assessment & Plan Note (Signed)
Continue Flonase, f/u ENT

## 2018-08-15 NOTE — Assessment & Plan Note (Signed)
Stable, continue Omeprazole 20mg qd.  

## 2018-08-15 NOTE — Assessment & Plan Note (Signed)
Blood pressure is controlled, continue to be off Losartan.

## 2018-08-15 NOTE — Progress Notes (Signed)
Location:   clinic   Place of Service:  Clinic (12) Provider: Marlana Latus NP  Code Status: DNR Goals of Care: [place] 08/09/2018  Does Patient Have a Medical Advance Directive? Yes  Type of Advance Directive Out of facility DNR (pink MOST or yellow form)  Does patient want to make changes to medical advance directive? No - Patient declined  Copy of [place] in Chart? -  Would patient like information on creating a medical advance directive? -  Pre-existing out of facility DNR order (yellow form or pink MOST form) Yellow form placed in chart (order not valid for inpatient use)     Chief Complaint  Patient presents with  . Hospitalization Follow-up    HPI: Patient is a 85 y.o. female seen today for hospital follow-up. The patient was hospitalized 08/09/18-08/10/18 for chest tightness, fatigue, dizziness, SOB. Influenza PCR negative, CTA of the chest-negative for PE, infiltrate, or pulmonary edema.  Previous stress test-low risk, EF wnl,  in Nov 2019 during her hospital stay for chest pain,  recommended to f/u Dr. Hochrein-cardiology. Off Losartan due to low Bp in ED  Hx of chronic diastolic CHF, HTN, hypothyroidism, on Levothyroxine 181mcg qd, last TSH 5.4 07/18/18.  Vit B12 deficiency, on Vit B12 1044mcg qd. Chronic nasal congestion, ear aches-pending ENT-on Flonase. Chronic BLE edema, on Furosemide $RemoveBefor'40mg'rqhJniIVEGfA$  qd, Metolazone 2.$RemoveBeforeDE'5mg'OAvxPVjsbwuufBo$  qd, [place] BEL negative for DVT. Hx of depression/anxiety, on Sertraline $RemoveBefor'75mg'ULZwdNsAdJRe$  qd. GERD, stable on Omeprazole $RemoveBefor'20mg'mowZCxWDCtoE$  qd.   Past Medical History:  Diagnosis Date  . ARTHRITIS, KNEES, BILATERAL   . Breast CA ([place]) 2000   s/p R lumpectomy and XRT  . DEPRESSION   . DJD (degenerative joint disease) of knee   . GERD   . HYPERLIPIDEMIA   . HYPERTENSION   . HYPOTHYROIDISM    postsurgical  . Hypoxia   . MIGRAINE HEADACHE   . Morbid obesity ([place])   . OSA (obstructive sleep apnea) 01/17/2011 dx  . Oxygen dependent   . Urge incontinence     . UTI (lower urinary tract infection) 12/2014    Past Surgical History:  Procedure Laterality Date  . ABDOMINAL HYSTERECTOMY    . ADENOIDECTOMY    . BREAST BIOPSY  2000  . BREAST LUMPECTOMY  2000  . CATARACT EXTRACTION    . CHOLECYSTECTOMY  1995  . CYSTOSCOPY/URETEROSCOPY/HOLMIUM LASER Right 06/28/2015   Procedure: CYSTOSCOPY RIGHT RETROGRAD RIGHT URETEROSCOPY/HOLMIUM LASER WITH RIGHT STENT PLACEMENT;  Surgeon: Alexis Frock, MD;  Location: [facility];  Service: Urology;  Laterality: Right;  . PARATHYROIDECTOMY     2-3 removed  . REPLACEMENT TOTAL KNEE BILATERAL  1999, 2005  . Right leg femur repaired    . THYROIDECTOMY    . TONSILLECTOMY    . TUBAL LIGATION      Allergies  Allergen Reactions  . Azithromycin Itching  . Beta Adrenergic Blockers Other (See Comments)    Depression   . Ciprofloxacin Other (See Comments)    Edgy and very jumpy    . Codeine Nausea And Vomiting  . Darvon [Propoxyphene] Nausea And Vomiting  . Epinephrine Other (See Comments)    Rapid pulse, sweats  . Klonopin [Clonazepam] Other (See Comments)    Makes her feel very suicidal   . Oxycodone Other (See Comments)    Patient felt like it altered her mental status "Crazy" . Hallucinations later as well.  [name] Paxil [Paroxetine Hydrochloride] Other (See Comments)    Severe depression   . Prednisone Other (  See Comments)    "makes me feel really bad"  . Propoxyphene Hcl Nausea And Vomiting  . Tape Itching  . Torsemide Other (See Comments)    Patient stated that she doesn't recall the reaction  . Tramadol Other (See Comments)    Feels "jittery"  . Vicodin [Hydrocodone-Acetaminophen] Other (See Comments)    Hallucinations  . Meloxicam Diarrhea  . Other Rash    Polyester sheets "cause me a rash"  . Vancomycin Rash    Localized rash related to infusion rate    Allergies as of 08/15/2018      Reactions   Azithromycin Itching   Beta Adrenergic Blockers Other (See Comments)   Depression   Ciprofloxacin  Other (See Comments)   Edgy and very jumpy    Codeine Nausea And Vomiting   Darvon [propoxyphene] Nausea And Vomiting   Epinephrine Other (See Comments)   Rapid pulse, sweats   Klonopin [clonazepam] Other (See Comments)   Makes her feel very suicidal    Oxycodone Other (See Comments)   Patient felt like it altered her mental status "Crazy" . Hallucinations later as well.   Paxil [paroxetine Hydrochloride] Other (See Comments)   Severe depression    Prednisone Other (See Comments)   "makes me feel really bad"   Propoxyphene Hcl Nausea And Vomiting   Tape Itching   Torsemide Other (See Comments)   Patient stated that she doesn't recall the reaction   Tramadol Other (See Comments)   Feels "jittery"   Vicodin [hydrocodone-acetaminophen] Other (See Comments)   Hallucinations   Meloxicam Diarrhea   Other Rash   Polyester sheets "cause me a rash"   Vancomycin Rash   Localized rash related to infusion rate      Medication List       Accurate as of August 15, 2018 11:59 PM. Always use your most recent med list.        acetaminophen 325 MG tablet Commonly known as:  TYLENOL Take 650 mg by mouth every 8 (eight) hours as needed for mild pain or moderate pain.   aspirin 81 MG tablet Take 81 mg by mouth daily.   fluticasone 50 MCG/ACT nasal spray Commonly known as:  FLONASE Place 2 sprays into both nostrils daily for 7 days.   furosemide 40 MG tablet Commonly known as:  LASIX Take 1 tablet (40 mg total) by mouth daily. Along with metolazone and potassium supplement   metolazone 2.5 MG tablet Commonly known as:  ZAROXOLYN TAKE 1 TABLET EACH DAY.   omeprazole 20 MG capsule Commonly known as:  PRILOSEC TAKE 1 CAPSULE TWICE DAILY BEFORE MEALS   ondansetron 4 MG tablet Commonly known as:  ZOFRAN TAKE (1) TABLET BY MOUTH EVERY SIX HOURS AS NEEDED FOR NAUSEA.   potassium chloride SA 20 MEQ tablet Commonly known as:  K-DUR,KLOR-CON TAKE 3 TABLETS TWICE DAILY.   psyllium  0.52 g capsule Commonly known as:  REGULOID Take 1.04 g by mouth daily as needed (for mild constipation).   sertraline 50 MG tablet Commonly known as:  ZOLOFT Take 1.5 tablets (75 mg total) by mouth daily.   SYNTHROID 150 MCG tablet Generic drug:  levothyroxine Take 150 mcg by mouth daily before breakfast.   vitamin B-12 1000 MCG tablet Commonly known as:  CYANOCOBALAMIN Take 2,000 mcg by mouth daily.       Review of Systems:  Review of Systems  Constitutional: Negative for activity change, appetite change, chills, diaphoresis, fatigue, fever and unexpected weight change.  HENT:  Positive for ear pain, hearing loss, postnasal drip and rhinorrhea. Negative for congestion, facial swelling, mouth sores, nosebleeds, sinus pressure, sinus pain, sore throat, trouble swallowing and voice change.   Respiratory: Positive for cough and shortness of breath. Negative for wheezing.   Cardiovascular: Positive for leg swelling. Negative for chest pain and palpitations.  Gastrointestinal: Negative for abdominal distention, abdominal pain, constipation, diarrhea, nausea and vomiting.  Genitourinary: Negative for difficulty urinating, dysuria and urgency.  Musculoskeletal: Positive for arthralgias and gait problem.  Skin: Negative for color change and pallor.       Chronic venous insufficiency skin changes BLE  Neurological: Negative for dizziness, speech difficulty, weakness and headaches.  Psychiatric/Behavioral: Positive for sleep disturbance. Negative for agitation, behavioral problems, confusion and hallucinations. The patient is not nervous/anxious.     Health Maintenance  Topic Date Due  . FOOT EXAM  09/08/1940  . OPHTHALMOLOGY EXAM  09/08/1940  . URINE MICROALBUMIN  09/08/1940  . DEXA SCAN  09/07/1997  . HEMOGLOBIN A1C  11/19/2018  . TETANUS/TDAP  10/05/2019  . INFLUENZA VACCINE  Completed  . PNA vac Low Risk Adult  Completed    Physical Exam: Vitals:   08/15/18 1303  BP: 124/62   Pulse: 83  Resp: 20  Temp: 98.3 F (36.8 C)  TempSrc: Oral  SpO2: 99%  Weight: 294 lb 9.6 oz (133.6 kg)  Height: $Remove'5\' 4"'ItKUybL$  (1.626 m)   Body mass index is 50.57 kg/m. Physical Exam Vitals signs reviewed.  Constitutional:      General: She is not in acute distress.    Appearance: Normal appearance. She is obese. She is not ill-appearing, toxic-appearing or diaphoretic.  HENT:     Head: Normocephalic and atraumatic.     Nose: Congestion and rhinorrhea present.     Mouth/Throat:     Mouth: Mucous membranes are moist.     Pharynx: No oropharyngeal exudate or posterior oropharyngeal erythema.  Eyes:     Extraocular Movements: Extraocular movements intact.     Conjunctiva/sclera: Conjunctivae normal.     Pupils: Pupils are equal, round, and reactive to light.  Neck:     Musculoskeletal: Normal range of motion and neck supple.  Cardiovascular:     Rate and Rhythm: Normal rate and regular rhythm.     Heart sounds: No murmur.  Pulmonary:     Effort: Pulmonary effort is normal.     Breath sounds: No wheezing, rhonchi or rales.  Abdominal:     General: There is no distension.     Palpations: Abdomen is soft.     Tenderness: There is no abdominal tenderness. There is no guarding or rebound.  Musculoskeletal:     Right lower leg: Edema present.     Left lower leg: Edema present.     Comments: Lymphedema BLE. Electric scooter to get around.   Skin:    General: Skin is warm and dry.     Comments: Chronic lipo dermatosclerosis skin changes BLE  Neurological:     General: No focal deficit present.     Mental Status: She is alert and oriented to person, place, and time. Mental status is at baseline.     Cranial Nerves: No cranial nerve deficit.     Motor: No weakness.     Gait: Gait abnormal.     Deep Tendon Reflexes: Reflexes normal.  Psychiatric:        Mood and Affect: Mood normal.        Behavior: Behavior normal.  Thought Content: Thought content normal.        Judgment:  Judgment normal.     Labs reviewed: Basic Metabolic Panel: Recent Labs    01/01/18 0715 01/10/18 0000 03/05/18 0745  05/20/18 0000 07/05/18 1248 07/05/18 1337 07/18/18 0000 08/09/18 0533 08/10/18 0535  NA 142 141 140   < > 139 140 140  --  138 139  K 4.0 3.7 3.3*   < > 3.6 3.2* 3.0*  --  3.8 4.1  CL 100 99 100   < > 100 100 96*  --  99 100  CO2 36* 33* 31   < > 31 29  --   --  30 31  GLUCOSE 115* 86 112*   < > 114* 117* 114*  --  133* 103*  BUN $Re'17 22 20   'Pvy$ < > $R'21 19 20  'uQ$ --  23 10  CREATININE 0.86 0.89* 0.95*   < > 0.92* 0.90 1.00  --  0.97 0.75  CALCIUM 8.0* 8.1* 8.1*   < > 8.1* 8.2*  --   --  8.1* 7.5*  MG 1.8 1.7  --   --  1.6  --   --   --   --   --   TSH  --   --  6.51*  --  9.66*  --   --  5.40*  --   --    < > = values in this interval not displayed.   Liver Function Tests: Recent Labs    03/15/18 0540 04/23/18 1519 05/20/18 0000 08/10/18 0535  AST 14* $Remov'20 16 21  'KcyYiK$ ALT $Remo'13 12 11 11  'ifqfe$ ALKPHOS 74 84  --  79  BILITOT 0.4 0.7 0.5 0.9  PROT 6.1* 6.6 6.4 5.7*  ALBUMIN 3.0* 3.0*  --  2.8*   No results for input(s): LIPASE, AMYLASE in the last 8760 hours. No results for input(s): AMMONIA in the last 8760 hours. CBC: Recent Labs    05/06/18 0613  07/05/18 1248 07/05/18 1337 08/09/18 0533 08/10/18 0621  WBC 7.3   < > 6.3  --  7.3 5.4  NEUTROABS 5.4  --  4.7  --  4.6  --   HGB 12.5   < > 12.9 12.9 12.2 11.5*  HCT 40.2   < > 41.0 38.0 39.1 38.6  MCV 94.4   < > 92.3  --  96.3 98.2  PLT 123*   < > 142*  --  129* 133*   < > = values in this interval not displayed.   Lipid Panel: Recent Labs    03/05/18 0745  CHOL 153  HDL 45*  LDLCALC 86  TRIG 127  CHOLHDL 3.4   Lab Results  Component Value Date   HGBA1C 6.5 (H) 05/20/2018    Procedures since last visit: Ct Angio Chest Pe W And/or Wo Contrast  Result Date: 08/09/2018 CLINICAL DATA:  Short of breath EXAM: CT ANGIOGRAPHY CHEST WITH CONTRAST TECHNIQUE: Multidetector CT imaging of the chest was performed  using the standard protocol during bolus administration of intravenous contrast. Multiplanar CT image reconstructions and MIPs were obtained to evaluate the vascular anatomy. CONTRAST:  <See Chart> ISOVUE-370 IOPAMIDOL (ISOVUE-370) INJECTION 76% COMPARISON:  04/23/2018 FINDINGS: Cardiovascular: There are no filling defects in the pulmonary arterial tree to suggest acute pulmonary thromboembolism. There is no obvious aortic dissection or acute intramural hematoma. Atherosclerotic calcifications of the aorta are noted. Mild LAD territory coronary artery calcification. Great vessels are patent. The heart is moderately  enlarged and dilated. Mediastinum/Nodes: No abnormal mediastinal adenopathy. Thyroid is atrophic. No pericardial effusion. Esophagus is within normal limits. Lungs/Pleura: Low volumes and dependent atelectasis bilaterally. No pneumothorax or pleural effusion. Upper Abdomen: Stable nonspecific low-density lesion in the spleen. There is most likely a hemangioma. Musculoskeletal: There is no vertebral compression deformity. Mild dextroscoliosis in the mid to lower thoracic spine. Bridging osteophytes are seen throughout the thoracic spine Review of the MIP images confirms the above findings. IMPRESSION: No evidence of acute pulmonary thromboembolism. Aortic Atherosclerosis (ICD10-I70.0). Electronically Signed   By: Marybelle Killings M.D.   On: 08/09/2018 07:39   Dg Chest Portable 1 View  Result Date: 08/09/2018 CLINICAL DATA:  Shortness of breath. EXAM: PORTABLE CHEST 1 VIEW COMPARISON:  07/05/2018.  05/06/2018. FINDINGS: Mediastinum stable. Heart size stable. Low lung volumes with right perihilar and bibasilar atelectasis. Tiny left pleural effusion can not be excluded. No pneumothorax. Postsurgical changes right breast. Degenerative changes scoliosis thoracic spine. IMPRESSION: Low lung volumes with right perihilar and bibasilar atelectasis. Tiny left pleural effusion can not be excluded. Electronically  Signed   By: Marcello Moores  Register   On: 08/09/2018 06:01   Vas [place] Lower Extremity Venous (dvt)  Result Date: 08/09/2018  Lower Venous Study Indications: Swelling.  Limitations: Body habitus. Performing Technologist: Abram Sander RVS  Examination Guidelines: A complete evaluation includes B-mode imaging, spectral Doppler, color Doppler, and power Doppler as needed of all accessible portions of each vessel. Bilateral testing is considered an integral part of a complete examination. Limited examinations for reoccurring indications may be performed as noted.  Right Venous Findings: +---------+---------------+---------+-----------+----------+--------------+          CompressibilityPhasicitySpontaneityPropertiesSummary        +---------+---------------+---------+-----------+----------+--------------+ CFV      Full           Yes      Yes                                 +---------+---------------+---------+-----------+----------+--------------+ SFJ      Full                                                        +---------+---------------+---------+-----------+----------+--------------+ FV Prox  Full                                                        +---------+---------------+---------+-----------+----------+--------------+ FV Mid   Full                                                        +---------+---------------+---------+-----------+----------+--------------+ FV DistalFull                                                        +---------+---------------+---------+-----------+----------+--------------+ PFV      Full                                                        +---------+---------------+---------+-----------+----------+--------------+  POP      Full           Yes      Yes                                 +---------+---------------+---------+-----------+----------+--------------+ PTV                                                   Not  visualized +---------+---------------+---------+-----------+----------+--------------+ PERO                                                  Not visualized +---------+---------------+---------+-----------+----------+--------------+  Left Venous Findings: +---------+---------------+---------+-----------+----------+--------------+          CompressibilityPhasicitySpontaneityPropertiesSummary        +---------+---------------+---------+-----------+----------+--------------+ CFV      Full           Yes      Yes                                 +---------+---------------+---------+-----------+----------+--------------+ SFJ      Full                                                        +---------+---------------+---------+-----------+----------+--------------+ FV Prox  Full                                                        +---------+---------------+---------+-----------+----------+--------------+ FV Mid   Full                                                        +---------+---------------+---------+-----------+----------+--------------+ FV Distal                                             Not visualized +---------+---------------+---------+-----------+----------+--------------+ PFV      Full                                                        +---------+---------------+---------+-----------+----------+--------------+ POP      Full           Yes      Yes                                 +---------+---------------+---------+-----------+----------+--------------+ PTV  Not visualized +---------+---------------+---------+-----------+----------+--------------+ PERO                                                  Not visualized +---------+---------------+---------+-----------+----------+--------------+    Summary: Right: There is no evidence of deep vein thrombosis in the lower extremity.  However, portions of this examination were limited- see technologist comments above. No cystic structure found in the popliteal fossa. Left: There is no evidence of deep vein thrombosis in the lower extremity. However, portions of this examination were limited- see technologist comments above. No cystic structure found in the popliteal fossa.  *See table(s) above for measurements and observations. Electronically signed by Deitra Mayo MD on 08/09/2018 at 2:36:54 PM.    Final     Assessment/Plan Acute on chronic diastolic (congestive) heart failure ([place]) Compensated clinically, continue Furosemide 40mg  qd, Metolazone 2.5mg  qd  Essential hypertension Blood pressure is controlled, continue to be off Losartan.   Obesity hypoventilation syndrome (HCC) Continue O2 at night.   Acid reflux Stable, continue Omeprazole 20mg  qd.   Hypothyroidism (acquired) Continue Levothyroxine 155mcg qd, last TSH 5.4 07/18/18. Pending TSH f/u in 12 weeks since 07/18/18  Anxiety Her mood is stable, continue Sertraline 75mg  qd.   Chronic acquired lymphedema US BLE was negative for DVT, no open wounds, chronic, continue Furosemide 40mg  qd, Metolazone 2.5mg  qd.   Vitamin B12 deficiency Stable, last Vit B12 level 884, continue Vit B12 1070mcg po qd.   Post-nasal drip Continue Flonase, f/u ENT  Diabetes mellitus type 2 in obese [facility]) Diet controlled, last Hgb a1c 6.5 05/20/18     Labs/tests ordered: none  Next appt:  08/22/2018

## 2018-08-15 NOTE — Assessment & Plan Note (Signed)
Compensated clinically, continue Furosemide 40mg  qd, Metolazone 2.5mg  qd

## 2018-08-15 NOTE — Assessment & Plan Note (Signed)
Diet controlled, last Hgb a1c 6.5 05/20/18

## 2018-08-15 NOTE — Assessment & Plan Note (Signed)
Her mood is stable, continue Sertraline 75mg  qd.

## 2018-08-15 NOTE — Assessment & Plan Note (Signed)
Stable, last Vit B12 level 884, continue Vit B12 1072mcg po qd.

## 2018-08-16 ENCOUNTER — Encounter: Payer: Self-pay | Admitting: Nurse Practitioner

## 2018-08-22 ENCOUNTER — Ambulatory Visit: Payer: Medicare Other | Admitting: Nurse Practitioner

## 2018-08-22 ENCOUNTER — Encounter: Payer: Self-pay | Admitting: Nurse Practitioner

## 2018-08-22 DIAGNOSIS — I5033 Acute on chronic diastolic (congestive) heart failure: Secondary | ICD-10-CM | POA: Diagnosis not present

## 2018-08-22 DIAGNOSIS — R293 Abnormal posture: Secondary | ICD-10-CM | POA: Diagnosis not present

## 2018-08-22 DIAGNOSIS — E538 Deficiency of other specified B group vitamins: Secondary | ICD-10-CM

## 2018-08-22 DIAGNOSIS — R1312 Dysphagia, oropharyngeal phase: Secondary | ICD-10-CM | POA: Diagnosis not present

## 2018-08-22 DIAGNOSIS — F419 Anxiety disorder, unspecified: Secondary | ICD-10-CM

## 2018-08-22 DIAGNOSIS — E039 Hypothyroidism, unspecified: Secondary | ICD-10-CM | POA: Diagnosis not present

## 2018-08-22 DIAGNOSIS — K219 Gastro-esophageal reflux disease without esophagitis: Secondary | ICD-10-CM | POA: Diagnosis not present

## 2018-08-22 DIAGNOSIS — R29898 Other symptoms and signs involving the musculoskeletal system: Secondary | ICD-10-CM | POA: Diagnosis not present

## 2018-08-22 DIAGNOSIS — M542 Cervicalgia: Secondary | ICD-10-CM

## 2018-08-22 DIAGNOSIS — I5089 Other heart failure: Secondary | ICD-10-CM | POA: Diagnosis not present

## 2018-08-22 MED ORDER — ONDANSETRON HCL 4 MG PO TABS: ORAL_TABLET | ORAL | 0 refills | Status: DC

## 2018-08-22 NOTE — Assessment & Plan Note (Signed)
depression/anxiety, stable, continue Sertraline 75mg  qd.

## 2018-08-22 NOTE — Assessment & Plan Note (Signed)
Stable, continue Omeprazole 20mg  qd. Prn Zofran.

## 2018-08-22 NOTE — Assessment & Plan Note (Signed)
BLE edema, stable, continue Metolazone 2.5mg  qd, Furosemide 40mg  qd

## 2018-08-22 NOTE — Assessment & Plan Note (Signed)
Stable, continue  Levothyroxine 120mcg qd, last TSH 5.4 07/18/18, pending f/u TSH

## 2018-08-22 NOTE — Progress Notes (Signed)
Location:   clinic [place]   Place of Service:   clinic [place] Provider: Marlana Latus NP  Code Status: DNR Goals of Care: [place] 08/09/2018  Does Patient Have a Medical Advance Directive? Yes  Type of Advance Directive Out of facility DNR (pink MOST or yellow form)  Does patient want to make changes to medical advance directive? No - Patient declined  Copy of [place] in Chart? -  Would patient like information on creating a medical advance directive? -  Pre-existing out of facility DNR order (yellow form or pink MOST form) Yellow form placed in chart (order not valid for inpatient use)     Chief Complaint  Patient presents with  . Acute Visit    (L) sided stiff neck x 2 days    HPI: Patient is a 85 y.o. female seen today for left neck pain x 1-2 days, not new, recurrent, she denied chest pain/pressures, palpitation, cough, sputum production. She denied injury, falling, or muscle strain.   The patient has history of depression/anxiety, stable on Sertraline $RemoveBefor'75mg'cRzniKzJTSbm$  qd. Vit B12, supplemented on Vit B12 2037mcg qd, last Vit B12 level 884 07/18/18 Hgb 11.5 08/10/18. /CHF/BLE edema, stable on Metolazone 2.$RemoveBeforeD'5mg'DGYBGHmtBNzeof$  qd, Furosemide $RemoveBefore'40mg'EjGZxHNVFAzZx$  qd. GERD stable on Omeprazole $RemoveBefor'20mg'GUlOMZKoIASi$  qd. Prn Zofran. Hypothyroidism, on Levothyroxine 186mcg qd, last TSH 5.4 07/18/18   Past Medical History:  Diagnosis Date  . ARTHRITIS, KNEES, BILATERAL   . Breast CA ([facility]) 2000   s/p R lumpectomy and XRT  . DEPRESSION   . DJD (degenerative joint disease) of knee   . GERD   . HYPERLIPIDEMIA   . HYPERTENSION   . HYPOTHYROIDISM    postsurgical  . Hypoxia   . MIGRAINE HEADACHE   . Morbid obesity ([place])   . OSA (obstructive sleep apnea) 01/17/2011 dx  . Oxygen dependent   . Urge incontinence   . UTI (lower urinary tract infection) 12/2014    Past Surgical History:  Procedure Laterality Date  . ABDOMINAL HYSTERECTOMY    . ADENOIDECTOMY    . BREAST BIOPSY  2000  . BREAST LUMPECTOMY  2000  .  CATARACT EXTRACTION    . CHOLECYSTECTOMY  1995  . CYSTOSCOPY/URETEROSCOPY/HOLMIUM LASER Right 06/28/2015   Procedure: CYSTOSCOPY RIGHT RETROGRAD RIGHT URETEROSCOPY/HOLMIUM LASER WITH RIGHT STENT PLACEMENT;  Surgeon: Alexis Frock, MD;  Location: [facility];  Service: Urology;  Laterality: Right;  . PARATHYROIDECTOMY     2-3 removed  . REPLACEMENT TOTAL KNEE BILATERAL  1999, 2005  . Right leg femur repaired    . THYROIDECTOMY    . TONSILLECTOMY    . TUBAL LIGATION      Allergies  Allergen Reactions  . Azithromycin Itching  . Beta Adrenergic Blockers Other (See Comments)    Depression   . Ciprofloxacin Other (See Comments)    Edgy and very jumpy    . Codeine Nausea And Vomiting  . Darvon [Propoxyphene] Nausea And Vomiting  . Epinephrine Other (See Comments)    Rapid pulse, sweats  . Klonopin [Clonazepam] Other (See Comments)    Makes her feel very suicidal   . Oxycodone Other (See Comments)    Patient felt like it altered her mental status "Crazy" . Hallucinations later as well.  [name] Paxil [Paroxetine Hydrochloride] Other (See Comments)    Severe depression   . Prednisone Other (See Comments)    "makes me feel really bad"  . Propoxyphene Hcl Nausea And Vomiting  . Tape Itching  . Torsemide Other (See  Comments)    Patient stated that she doesn't recall the reaction  . Tramadol Other (See Comments)    Feels "jittery"  . Vicodin [Hydrocodone-Acetaminophen] Other (See Comments)    Hallucinations  . Meloxicam Diarrhea  . Other Rash    Polyester sheets "cause me a rash"  . Vancomycin Rash    Localized rash related to infusion rate    Allergies as of 08/22/2018      Reactions   Azithromycin Itching   Beta Adrenergic Blockers Other (See Comments)   Depression   Ciprofloxacin Other (See Comments)   Edgy and very jumpy    Codeine Nausea And Vomiting   Darvon [propoxyphene] Nausea And Vomiting   Epinephrine Other (See Comments)   Rapid pulse, sweats   Klonopin [clonazepam] Other  (See Comments)   Makes her feel very suicidal    Oxycodone Other (See Comments)   Patient felt like it altered her mental status "Crazy" . Hallucinations later as well.   Paxil [paroxetine Hydrochloride] Other (See Comments)   Severe depression    Prednisone Other (See Comments)   "makes me feel really bad"   Propoxyphene Hcl Nausea And Vomiting   Tape Itching   Torsemide Other (See Comments)   Patient stated that she doesn't recall the reaction   Tramadol Other (See Comments)   Feels "jittery"   Vicodin [hydrocodone-acetaminophen] Other (See Comments)   Hallucinations   Meloxicam Diarrhea   Other Rash   Polyester sheets "cause me a rash"   Vancomycin Rash   Localized rash related to infusion rate      Medication List       Accurate as of August 22, 2018 11:59 PM. Always use your most recent med list.        acetaminophen 325 MG tablet Commonly known as:  TYLENOL Take 650 mg by mouth every 8 (eight) hours as needed for mild pain or moderate pain.   aspirin 81 MG tablet Take 81 mg by mouth daily.   fluticasone 50 MCG/ACT nasal spray Commonly known as:  FLONASE Place 2 sprays into both nostrils daily.   furosemide 40 MG tablet Commonly known as:  LASIX Take 1 tablet (40 mg total) by mouth daily. Along with metolazone and potassium supplement   metolazone 2.5 MG tablet Commonly known as:  ZAROXOLYN TAKE 1 TABLET EACH DAY.   omeprazole 20 MG capsule Commonly known as:  PRILOSEC TAKE 1 CAPSULE TWICE DAILY BEFORE MEALS   ondansetron 4 MG tablet Commonly known as:  ZOFRAN TAKE (1) TABLET BY MOUTH EVERY SIX HOURS AS NEEDED FOR NAUSEA.   potassium chloride SA 20 MEQ tablet Commonly known as:  K-DUR,KLOR-CON TAKE 3 TABLETS TWICE DAILY.   psyllium 0.52 g capsule Commonly known as:  REGULOID Take 1.04 g by mouth daily as needed (for mild constipation).   sertraline 50 MG tablet Commonly known as:  Zoloft Take 1.5 tablets (75 mg total) by mouth daily.   Synthroid  150 MCG tablet Generic drug:  levothyroxine Take 150 mcg by mouth daily before breakfast.   vitamin B-12 1000 MCG tablet Commonly known as:  CYANOCOBALAMIN Take 2,000 mcg by mouth daily.       Review of Systems:  Review of Systems  Constitutional: Negative for activity change, appetite change, chills, diaphoresis, fatigue and fever.  HENT: Positive for hearing loss. Negative for congestion and voice change.   Respiratory: Positive for cough and shortness of breath. Negative for wheezing.        Chronic hacking  cough, DOE  Cardiovascular: Positive for leg swelling. Negative for chest pain and palpitations.  Gastrointestinal: Negative for abdominal distention, abdominal pain, constipation, diarrhea, nausea and vomiting.  Genitourinary: Negative for difficulty urinating, dysuria and urgency.  Musculoskeletal: Positive for arthralgias, back pain and gait problem.       Left neck pain.   Skin: Positive for color change. Negative for pallor.       Chronic venous insufficiency change BLE  Neurological: Negative for dizziness, speech difficulty, weakness and headaches.  Psychiatric/Behavioral: Negative for agitation, behavioral problems, hallucinations and sleep disturbance. The patient is not nervous/anxious.     Health Maintenance  Topic Date Due  . FOOT EXAM  09/08/1940  . OPHTHALMOLOGY EXAM  09/08/1940  . URINE MICROALBUMIN  09/08/1940  . DEXA SCAN  09/07/1997  . HEMOGLOBIN A1C  11/19/2018  . TETANUS/TDAP  10/05/2019  . INFLUENZA VACCINE  Completed  . PNA vac Low Risk Adult  Completed    Physical Exam: Vitals:   08/22/18 1459  BP: 120/78  Pulse: 92  Resp: 20  Temp: 98 F (36.7 C)  SpO2: 92%  Weight: 295 lb (133.8 kg)  Height: $Remove'5\' 4"'lWUYYgl$  (1.626 m)   Body mass index is 50.64 kg/m. Physical Exam Constitutional:      General: She is not in acute distress.    Appearance: Normal appearance. She is not ill-appearing, toxic-appearing or diaphoretic.  HENT:     Head:  Normocephalic and atraumatic.     Nose: Nose normal.     Mouth/Throat:     Mouth: Mucous membranes are moist.  Eyes:     Extraocular Movements: Extraocular movements intact.     Pupils: Pupils are equal, round, and reactive to light.  Neck:     Musculoskeletal: Normal range of motion and neck supple.  Cardiovascular:     Rate and Rhythm: Normal rate and regular rhythm.     Heart sounds: No murmur.  Pulmonary:     Effort: Pulmonary effort is normal.     Breath sounds: No wheezing, rhonchi or rales.  Abdominal:     General: There is no distension.     Palpations: Abdomen is soft.     Tenderness: There is no abdominal tenderness. There is no guarding or rebound.  Musculoskeletal:        General: Tenderness present.     Right lower leg: Edema present.     Left lower leg: Edema present.     Comments: Left trapezium pain.   Skin:    General: Skin is warm and dry.     Comments: Chronic venous insufficiency changes BLE  Neurological:     General: No focal deficit present.     Mental Status: She is alert and oriented to person, place, and time.     Motor: No weakness.     Coordination: Coordination normal.     Gait: Gait abnormal.  Psychiatric:        Mood and Affect: Mood normal.        Behavior: Behavior normal.        Thought Content: Thought content normal.        Judgment: Judgment normal.     Labs reviewed: Basic Metabolic Panel: Recent Labs    01/01/18 0715 01/10/18 0000 03/05/18 0745  05/20/18 0000 07/05/18 1248 07/05/18 1337 07/18/18 0000 08/09/18 0533 08/10/18 0535  NA 142 141 140   < > 139 140 140  --  138 139  K 4.0 3.7 3.3*   < >  3.6 3.2* 3.0*  --  3.8 4.1  CL 100 99 100   < > 100 100 96*  --  99 100  CO2 36* 33* 31   < > 31 29  --   --  30 31  GLUCOSE 115* 86 112*   < > 114* 117* 114*  --  133* 103*  BUN $Re'17 22 20   'TDv$ < > $R'21 19 20  'Aj$ --  23 10  CREATININE 0.86 0.89* 0.95*   < > 0.92* 0.90 1.00  --  0.97 0.75  CALCIUM 8.0* 8.1* 8.1*   < > 8.1* 8.2*  --   --   8.1* 7.5*  MG 1.8 1.7  --   --  1.6  --   --   --   --   --   TSH  --   --  6.51*  --  9.66*  --   --  5.40*  --   --    < > = values in this interval not displayed.   Liver Function Tests: Recent Labs    03/15/18 0540 04/23/18 1519 05/20/18 0000 08/10/18 0535  AST 14* $Remov'20 16 21  'AxpjyF$ ALT $Remo'13 12 11 11  'snIYq$ ALKPHOS 74 84  --  79  BILITOT 0.4 0.7 0.5 0.9  PROT 6.1* 6.6 6.4 5.7*  ALBUMIN 3.0* 3.0*  --  2.8*   No results for input(s): LIPASE, AMYLASE in the last 8760 hours. No results for input(s): AMMONIA in the last 8760 hours. CBC: Recent Labs    05/06/18 0613  07/05/18 1248 07/05/18 1337 08/09/18 0533 08/10/18 0621  WBC 7.3   < > 6.3  --  7.3 5.4  NEUTROABS 5.4  --  4.7  --  4.6  --   HGB 12.5   < > 12.9 12.9 12.2 11.5*  HCT 40.2   < > 41.0 38.0 39.1 38.6  MCV 94.4   < > 92.3  --  96.3 98.2  PLT 123*   < > 142*  --  129* 133*   < > = values in this interval not displayed.   Lipid Panel: Recent Labs    03/05/18 0745  CHOL 153  HDL 45*  LDLCALC 86  TRIG 127  CHOLHDL 3.4   Lab Results  Component Value Date   HGBA1C 6.5 (H) 05/20/2018    Procedures since last visit: Ct Angio Chest Pe W And/or Wo Contrast  Result Date: 08/09/2018 CLINICAL DATA:  Short of breath EXAM: CT ANGIOGRAPHY CHEST WITH CONTRAST TECHNIQUE: Multidetector CT imaging of the chest was performed using the standard protocol during bolus administration of intravenous contrast. Multiplanar CT image reconstructions and MIPs were obtained to evaluate the vascular anatomy. CONTRAST:  <See Chart> ISOVUE-370 IOPAMIDOL (ISOVUE-370) INJECTION 76% COMPARISON:  04/23/2018 FINDINGS: Cardiovascular: There are no filling defects in the pulmonary arterial tree to suggest acute pulmonary thromboembolism. There is no obvious aortic dissection or acute intramural hematoma. Atherosclerotic calcifications of the aorta are noted. Mild LAD territory coronary artery calcification. Great vessels are patent. The heart is moderately  enlarged and dilated. Mediastinum/Nodes: No abnormal mediastinal adenopathy. Thyroid is atrophic. No pericardial effusion. Esophagus is within normal limits. Lungs/Pleura: Low volumes and dependent atelectasis bilaterally. No pneumothorax or pleural effusion. Upper Abdomen: Stable nonspecific low-density lesion in the spleen. There is most likely a hemangioma. Musculoskeletal: There is no vertebral compression deformity. Mild dextroscoliosis in the mid to lower thoracic spine. Bridging osteophytes are seen throughout the thoracic spine Review of the MIP  images confirms the above findings. IMPRESSION: No evidence of acute pulmonary thromboembolism. Aortic Atherosclerosis (ICD10-I70.0). Electronically Signed   By: Marybelle Killings M.D.   On: 08/09/2018 07:39   Dg Chest Portable 1 View  Result Date: 08/09/2018 CLINICAL DATA:  Shortness of breath. EXAM: PORTABLE CHEST 1 VIEW COMPARISON:  07/05/2018.  05/06/2018. FINDINGS: Mediastinum stable. Heart size stable. Low lung volumes with right perihilar and bibasilar atelectasis. Tiny left pleural effusion can not be excluded. No pneumothorax. Postsurgical changes right breast. Degenerative changes scoliosis thoracic spine. IMPRESSION: Low lung volumes with right perihilar and bibasilar atelectasis. Tiny left pleural effusion can not be excluded. Electronically Signed   By: Marcello Moores  Register   On: 08/09/2018 06:01   Vas [place] Lower Extremity Venous (dvt)  Result Date: 08/09/2018  Lower Venous Study Indications: Swelling.  Limitations: Body habitus. Performing Technologist: Abram Sander RVS  Examination Guidelines: A complete evaluation includes B-mode imaging, spectral Doppler, color Doppler, and power Doppler as needed of all accessible portions of each vessel. Bilateral testing is considered an integral part of a complete examination. Limited examinations for reoccurring indications may be performed as noted.  Right Venous Findings:  +---------+---------------+---------+-----------+----------+--------------+          CompressibilityPhasicitySpontaneityPropertiesSummary        +---------+---------------+---------+-----------+----------+--------------+ CFV      Full           Yes      Yes                                 +---------+---------------+---------+-----------+----------+--------------+ SFJ      Full                                                        +---------+---------------+---------+-----------+----------+--------------+ FV Prox  Full                                                        +---------+---------------+---------+-----------+----------+--------------+ FV Mid   Full                                                        +---------+---------------+---------+-----------+----------+--------------+ FV DistalFull                                                        +---------+---------------+---------+-----------+----------+--------------+ PFV      Full                                                        +---------+---------------+---------+-----------+----------+--------------+ POP      Full           Yes  Yes                                 +---------+---------------+---------+-----------+----------+--------------+ PTV                                                   Not visualized +---------+---------------+---------+-----------+----------+--------------+ PERO                                                  Not visualized +---------+---------------+---------+-----------+----------+--------------+  Left Venous Findings: +---------+---------------+---------+-----------+----------+--------------+          CompressibilityPhasicitySpontaneityPropertiesSummary        +---------+---------------+---------+-----------+----------+--------------+ CFV      Full           Yes      Yes                                  +---------+---------------+---------+-----------+----------+--------------+ SFJ      Full                                                        +---------+---------------+---------+-----------+----------+--------------+ FV Prox  Full                                                        +---------+---------------+---------+-----------+----------+--------------+ FV Mid   Full                                                        +---------+---------------+---------+-----------+----------+--------------+ FV Distal                                             Not visualized +---------+---------------+---------+-----------+----------+--------------+ PFV      Full                                                        +---------+---------------+---------+-----------+----------+--------------+ POP      Full           Yes      Yes                                 +---------+---------------+---------+-----------+----------+--------------+ PTV  Not visualized +---------+---------------+---------+-----------+----------+--------------+ PERO                                                  Not visualized +---------+---------------+---------+-----------+----------+--------------+    Summary: Right: There is no evidence of deep vein thrombosis in the lower extremity. However, portions of this examination were limited- see technologist comments above. No cystic structure found in the popliteal fossa. Left: There is no evidence of deep vein thrombosis in the lower extremity. However, portions of this examination were limited- see technologist comments above. No cystic structure found in the popliteal fossa.  *See table(s) above for measurements and observations. Electronically signed by Deitra Mayo MD on 08/09/2018 at 2:36:54 PM.    Final     Assessment/Plan  Acute on chronic diastolic (congestive) heart failure  (HCC) BLE edema, stable, continue Metolazone 2.5mg  qd, Furosemide 40mg  qd  Acid reflux Stable, continue Omeprazole 20mg  qd. Prn Zofran.   Hypothyroidism (acquired) Stable, continue  Levothyroxine 13mcg qd, last TSH 5.4 07/18/18, pending f/u TSH   Anxiety depression/anxiety, stable, continue Sertraline 75mg  qd.   Vitamin B12 deficiency supplemented on Vit B12 2080mcg qd, last Vit B12 level 884 07/18/18  Neck pain on left side Chronic, left trapezium tenderness, PT to eval and treat, prn Ibuprofen with food, warm compress prn.    Labs/tests ordered:  None  Next appt:  4 months

## 2018-08-22 NOTE — Assessment & Plan Note (Addendum)
Chronic, left trapezium tenderness, PT to eval and treat, prn Ibuprofen with food, warm compress prn.

## 2018-08-22 NOTE — Assessment & Plan Note (Signed)
supplemented on Vit B12 205mcg qd, last Vit B12 level 884 07/18/18

## 2018-08-23 DIAGNOSIS — R293 Abnormal posture: Secondary | ICD-10-CM | POA: Diagnosis not present

## 2018-08-23 DIAGNOSIS — I5089 Other heart failure: Secondary | ICD-10-CM | POA: Diagnosis not present

## 2018-08-23 DIAGNOSIS — M542 Cervicalgia: Secondary | ICD-10-CM | POA: Diagnosis not present

## 2018-08-23 DIAGNOSIS — R1312 Dysphagia, oropharyngeal phase: Secondary | ICD-10-CM | POA: Diagnosis not present

## 2018-08-23 DIAGNOSIS — R29898 Other symptoms and signs involving the musculoskeletal system: Secondary | ICD-10-CM | POA: Diagnosis not present

## 2018-08-26 DIAGNOSIS — R293 Abnormal posture: Secondary | ICD-10-CM | POA: Diagnosis not present

## 2018-08-26 DIAGNOSIS — I5089 Other heart failure: Secondary | ICD-10-CM | POA: Diagnosis not present

## 2018-08-26 DIAGNOSIS — R1312 Dysphagia, oropharyngeal phase: Secondary | ICD-10-CM | POA: Diagnosis not present

## 2018-08-26 DIAGNOSIS — R29898 Other symptoms and signs involving the musculoskeletal system: Secondary | ICD-10-CM | POA: Diagnosis not present

## 2018-08-26 DIAGNOSIS — M542 Cervicalgia: Secondary | ICD-10-CM | POA: Diagnosis not present

## 2018-08-28 DIAGNOSIS — I5089 Other heart failure: Secondary | ICD-10-CM | POA: Diagnosis not present

## 2018-08-28 DIAGNOSIS — M542 Cervicalgia: Secondary | ICD-10-CM | POA: Diagnosis not present

## 2018-08-28 DIAGNOSIS — R29898 Other symptoms and signs involving the musculoskeletal system: Secondary | ICD-10-CM | POA: Diagnosis not present

## 2018-08-28 DIAGNOSIS — R293 Abnormal posture: Secondary | ICD-10-CM | POA: Diagnosis not present

## 2018-08-28 DIAGNOSIS — R1312 Dysphagia, oropharyngeal phase: Secondary | ICD-10-CM | POA: Diagnosis not present

## 2018-08-30 DIAGNOSIS — I5089 Other heart failure: Secondary | ICD-10-CM | POA: Diagnosis not present

## 2018-08-30 DIAGNOSIS — R293 Abnormal posture: Secondary | ICD-10-CM | POA: Diagnosis not present

## 2018-08-30 DIAGNOSIS — R1312 Dysphagia, oropharyngeal phase: Secondary | ICD-10-CM | POA: Diagnosis not present

## 2018-08-30 DIAGNOSIS — R29898 Other symptoms and signs involving the musculoskeletal system: Secondary | ICD-10-CM | POA: Diagnosis not present

## 2018-08-30 DIAGNOSIS — M542 Cervicalgia: Secondary | ICD-10-CM | POA: Diagnosis not present

## 2018-09-02 DIAGNOSIS — R1312 Dysphagia, oropharyngeal phase: Secondary | ICD-10-CM | POA: Diagnosis not present

## 2018-09-02 DIAGNOSIS — R29898 Other symptoms and signs involving the musculoskeletal system: Secondary | ICD-10-CM | POA: Diagnosis not present

## 2018-09-02 DIAGNOSIS — I5089 Other heart failure: Secondary | ICD-10-CM | POA: Diagnosis not present

## 2018-09-02 DIAGNOSIS — R293 Abnormal posture: Secondary | ICD-10-CM | POA: Diagnosis not present

## 2018-09-02 DIAGNOSIS — M542 Cervicalgia: Secondary | ICD-10-CM | POA: Diagnosis not present

## 2018-09-04 DIAGNOSIS — R1312 Dysphagia, oropharyngeal phase: Secondary | ICD-10-CM | POA: Diagnosis not present

## 2018-09-04 DIAGNOSIS — M542 Cervicalgia: Secondary | ICD-10-CM | POA: Diagnosis not present

## 2018-09-04 DIAGNOSIS — R29898 Other symptoms and signs involving the musculoskeletal system: Secondary | ICD-10-CM | POA: Diagnosis not present

## 2018-09-04 DIAGNOSIS — R293 Abnormal posture: Secondary | ICD-10-CM | POA: Diagnosis not present

## 2018-09-04 DIAGNOSIS — I5089 Other heart failure: Secondary | ICD-10-CM | POA: Diagnosis not present

## 2018-09-06 DIAGNOSIS — M542 Cervicalgia: Secondary | ICD-10-CM | POA: Diagnosis not present

## 2018-09-06 DIAGNOSIS — I5089 Other heart failure: Secondary | ICD-10-CM | POA: Diagnosis not present

## 2018-09-06 DIAGNOSIS — R1312 Dysphagia, oropharyngeal phase: Secondary | ICD-10-CM | POA: Diagnosis not present

## 2018-09-06 DIAGNOSIS — R293 Abnormal posture: Secondary | ICD-10-CM | POA: Diagnosis not present

## 2018-09-06 DIAGNOSIS — R29898 Other symptoms and signs involving the musculoskeletal system: Secondary | ICD-10-CM | POA: Diagnosis not present

## 2018-09-12 ENCOUNTER — Other Ambulatory Visit: Payer: Self-pay | Admitting: Nurse Practitioner

## 2018-09-12 DIAGNOSIS — M542 Cervicalgia: Secondary | ICD-10-CM | POA: Diagnosis not present

## 2018-09-12 DIAGNOSIS — I890 Lymphedema, not elsewhere classified: Secondary | ICD-10-CM

## 2018-09-12 DIAGNOSIS — R293 Abnormal posture: Secondary | ICD-10-CM | POA: Diagnosis not present

## 2018-09-12 DIAGNOSIS — R1312 Dysphagia, oropharyngeal phase: Secondary | ICD-10-CM | POA: Diagnosis not present

## 2018-09-12 DIAGNOSIS — R29898 Other symptoms and signs involving the musculoskeletal system: Secondary | ICD-10-CM | POA: Diagnosis not present

## 2018-09-12 DIAGNOSIS — I5089 Other heart failure: Secondary | ICD-10-CM | POA: Diagnosis not present

## 2018-09-16 DIAGNOSIS — M542 Cervicalgia: Secondary | ICD-10-CM | POA: Diagnosis not present

## 2018-09-16 DIAGNOSIS — R1312 Dysphagia, oropharyngeal phase: Secondary | ICD-10-CM | POA: Diagnosis not present

## 2018-09-16 DIAGNOSIS — R293 Abnormal posture: Secondary | ICD-10-CM | POA: Diagnosis not present

## 2018-09-16 DIAGNOSIS — R29898 Other symptoms and signs involving the musculoskeletal system: Secondary | ICD-10-CM | POA: Diagnosis not present

## 2018-09-16 DIAGNOSIS — I5089 Other heart failure: Secondary | ICD-10-CM | POA: Diagnosis not present

## 2018-10-29 ENCOUNTER — Encounter: Payer: Self-pay | Admitting: Internal Medicine

## 2018-10-29 ENCOUNTER — Non-Acute Institutional Stay: Payer: Medicare Other | Admitting: Internal Medicine

## 2018-10-29 DIAGNOSIS — E662 Morbid (severe) obesity with alveolar hypoventilation: Secondary | ICD-10-CM

## 2018-10-29 DIAGNOSIS — I10 Essential (primary) hypertension: Secondary | ICD-10-CM | POA: Diagnosis not present

## 2018-10-29 DIAGNOSIS — E785 Hyperlipidemia, unspecified: Secondary | ICD-10-CM | POA: Diagnosis not present

## 2018-10-29 DIAGNOSIS — N183 Chronic kidney disease, stage 3 unspecified: Secondary | ICD-10-CM

## 2018-10-29 DIAGNOSIS — R0602 Shortness of breath: Secondary | ICD-10-CM | POA: Diagnosis not present

## 2018-10-29 DIAGNOSIS — I5032 Chronic diastolic (congestive) heart failure: Secondary | ICD-10-CM

## 2018-10-29 DIAGNOSIS — E669 Obesity, unspecified: Secondary | ICD-10-CM

## 2018-10-29 DIAGNOSIS — E039 Hypothyroidism, unspecified: Secondary | ICD-10-CM

## 2018-10-29 DIAGNOSIS — F419 Anxiety disorder, unspecified: Secondary | ICD-10-CM

## 2018-10-29 DIAGNOSIS — K219 Gastro-esophageal reflux disease without esophagitis: Secondary | ICD-10-CM

## 2018-10-29 DIAGNOSIS — E1169 Type 2 diabetes mellitus with other specified complication: Secondary | ICD-10-CM

## 2018-10-29 DIAGNOSIS — E538 Deficiency of other specified B group vitamins: Secondary | ICD-10-CM

## 2018-10-29 DIAGNOSIS — R0902 Hypoxemia: Secondary | ICD-10-CM | POA: Diagnosis not present

## 2018-10-29 NOTE — Progress Notes (Signed)
Location:  [facility] Room Number: 907 Place of Service:  ALF [phone]) Provider:Gupta Anjali L,MD   Virgie Dad, MD  Patient Care Team: Virgie Dad, MD as PCP - General (Internal Medicine) Minus Breeding, MD as PCP - Cardiology (Cardiology) Azucena Fallen, MD as Consulting Physician (Obstetrics and Gynecology) Chesley Mires, MD as Consulting Physician (Pulmonary Disease) Mast, Man X, NP as Nurse Practitioner (Internal Medicine) Christin Fudge, MD as Consulting Physician (Surgery)  Extended Emergency Contact Information Primary Emergency Contact: Boxman,Lee Address: [address]          [place], [place] [address] Johnnette Litter of [place] Phone: [phone] Mobile Phone: [phone] Relation: Daughter Secondary Emergency Contact: Ritta Slot [place] of [place] Mobile Phone: [phone] Relation: Daughter  Code Status:DNR  Goals of care: Advanced Directive information Advanced Directives 10/29/2018  Does Patient Have a Medical Advance Directive? Yes  Type of Paramedic of [place];Out of facility DNR (pink MOST or yellow form);Living will  Does patient want to make changes to medical advance directive? No - Patient declined  Copy of [place] in Chart? Yes - validated most recent copy scanned in chart (See row information)  Would patient like information on creating a medical advance directive? -  Pre-existing out of facility DNR order (yellow form or pink MOST form) Yellow form placed in chart (order not valid for inpatient use);Pink MOST form placed in chart (order not valid for inpatient use)     Chief Complaint  Patient presents with  . Acute Visit    HPI:  Pt is a 85 y.o. female seen today for an acute visit for For Sob and Anxiety  Patient has h/o Hypertension, Chronic LE edema with Diastolic CHF and Lymphedema, Anxiety, B12 def,OSA but does not use CPAP only Oxygen , Diabetes Type2  She lives in [place] and gets around in [employer]. Is independnet in her ADLS. And transfers. Her husband passed away in 02-19-2023 of Last year. So she is by herself in apartment For Past few weeks she has been having SOB with anxiety.  She says she feels tight in her chest but no chest pain.  She also changes her  diuretics depending upon her weight and at this time she was not taking her Lasix. She called  nurse yesterday night because she was very nervous, could not breathe and was  having some visual hallucinations.  The nurse offered her to go to the hospital but patient refused.  So she is now admitted in AL for observation.  Patient feels much better this morning has some cough but no shortness of breath, fever, chest pain said the hallucinations are mostly at night. She says she sometime get up at night and would not know where she is She was in the hospital in 08/09/18 for SOB .Her CT scan Angiogram of chest was negative for PE and edema    Past Medical History:  Diagnosis Date  . ARTHRITIS, KNEES, BILATERAL   . Breast CA ([facility]) 2000   s/p R lumpectomy and XRT  . DEPRESSION   . DJD (degenerative joint disease) of knee   . GERD   . HYPERLIPIDEMIA   . HYPERTENSION   . HYPOTHYROIDISM    postsurgical  . Hypoxia   . MIGRAINE HEADACHE   . Morbid obesity ([facility])   . OSA (obstructive sleep apnea) 01/17/2011 dx  . Oxygen dependent   . Urge incontinence   . UTI (lower urinary tract infection)  12/2014   Past Surgical History:  Procedure Laterality Date  . ABDOMINAL HYSTERECTOMY    . ADENOIDECTOMY    . BREAST BIOPSY  2000  . BREAST LUMPECTOMY  2000  . CATARACT EXTRACTION    . CHOLECYSTECTOMY  1995  . CYSTOSCOPY/URETEROSCOPY/HOLMIUM LASER Right 06/28/2015   Procedure: CYSTOSCOPY RIGHT RETROGRAD RIGHT URETEROSCOPY/HOLMIUM LASER WITH RIGHT STENT PLACEMENT;  Surgeon: Alexis Frock, MD;  Location: [facility];  Service: Urology;  Laterality: Right;  . PARATHYROIDECTOMY     2-3 removed  .  REPLACEMENT TOTAL KNEE BILATERAL  1999, 2005  . Right leg femur repaired    . THYROIDECTOMY    . TONSILLECTOMY    . TUBAL LIGATION      Allergies  Allergen Reactions  . Azithromycin Itching  . Beta Adrenergic Blockers Other (See Comments)    Depression   . Ciprofloxacin Other (See Comments)    Edgy and very jumpy    . Codeine Nausea And Vomiting  . Darvon [Propoxyphene] Nausea And Vomiting  . Epinephrine Other (See Comments)    Rapid pulse, sweats  . Klonopin [Clonazepam] Other (See Comments)    Makes her feel very suicidal   . Oxycodone Other (See Comments)    Patient felt like it altered her mental status "Crazy" . Hallucinations later as well.  [name] Paxil [Paroxetine Hydrochloride] Other (See Comments)    Severe depression   . Prednisone Other (See Comments)    "makes me feel really bad"  . Propoxyphene Hcl Nausea And Vomiting  . Tape Itching  . Torsemide Other (See Comments)    Patient stated that she doesn't recall the reaction  . Tramadol Other (See Comments)    Feels "jittery"  . Vicodin [Hydrocodone-Acetaminophen] Other (See Comments)    Hallucinations  . Meloxicam Diarrhea  . Other Rash    Polyester sheets "cause me a rash"  . Vancomycin Rash    Localized rash related to infusion rate    Outpatient Encounter Medications as of 10/29/2018  Medication Sig  . acetaminophen (TYLENOL) 325 MG tablet Take 650 mg by mouth every 8 (eight) hours as needed for mild pain or moderate pain.   [name] aspirin 81 MG tablet Take 81 mg by mouth daily.   . Calcium Polycarbophil (FIBER-CAPS PO) Take 2 capsules by mouth daily.  [name] levothyroxine (SYNTHROID) 150 MCG tablet Take 150 mcg by mouth daily before breakfast.  . omeprazole (PRILOSEC) 20 MG capsule Take 20 mg by mouth daily.  . ondansetron (ZOFRAN) 4 MG tablet TAKE (1) TABLET BY MOUTH EVERY SIX HOURS AS NEEDED FOR NAUSEA.  [name] potassium chloride (MICRO-K) 10 MEQ CR capsule Take 10 mEq by mouth 4 (four) times daily.  . sertraline  (ZOLOFT) 50 MG tablet Take 1.5 tablets (75 mg total) by mouth daily.  . tamsulosin (FLOMAX) 0.4 MG CAPS capsule Take 0.4 mg by mouth at bedtime.  . [DISCONTINUED] fluticasone (FLONASE) 50 MCG/ACT nasal spray Place 2 sprays into both nostrils daily.  . [DISCONTINUED] furosemide (LASIX) 40 MG tablet Take 1 tablet (40 mg total) by mouth daily. Along with metolazone and potassium supplement  . [DISCONTINUED] metolazone (ZAROXOLYN) 2.5 MG tablet TAKE 1 TABLET EACH DAY.  . [DISCONTINUED] omeprazole (PRILOSEC) 20 MG capsule TAKE 1 CAPSULE TWICE DAILY BEFORE MEALS  . [DISCONTINUED] potassium chloride SA (K-DUR,KLOR-CON) 20 MEQ tablet TAKE 3 TABLETS TWICE DAILY.  . [DISCONTINUED] psyllium (REGULOID) 0.52 g capsule Take 1.04 g by mouth daily as needed (for mild constipation).   . [DISCONTINUED] vitamin B-12 (CYANOCOBALAMIN) 1000  MCG tablet Take 2,000 mcg by mouth daily.   No facility-administered encounter medications on file as of 10/29/2018.     Review of Systems  Constitutional: Positive for activity change.  HENT: Negative.   Respiratory: Positive for cough and shortness of breath.   Cardiovascular: Positive for leg swelling.  Gastrointestinal: Positive for diarrhea.  Genitourinary: Negative.   Musculoskeletal: Negative.   Skin: Negative.   Neurological: Positive for weakness.  Psychiatric/Behavioral: Positive for hallucinations and sleep disturbance. The patient is nervous/anxious.     Immunization History  Administered Date(s) Administered  . Influenza Split 03/20/2011  . Influenza Whole 03/19/2009, 03/19/2012  . Influenza, High Dose Seasonal PF 03/16/2018  . Influenza,inj,Quad PF,6+ Mos 03/19/2013  . Influenza-Unspecified 03/03/2014, 03/15/2015, 03/30/2016, 04/05/2017  . Meningococcal Polysaccharide 03/15/2015  . PPD Test 03/30/2016  . Pneumococcal Conjugate-13 03/12/2014  . Pneumococcal Polysaccharide-23 06/19/2006, 03/15/2015  . Td 10/04/2009  . Tdap 06/19/2005  . Zoster  06/19/2010  . Zoster Recombinat (Shingrix) 01/28/2018, 04/22/2018, 05/22/2018   Pertinent  Health Maintenance Due  Topic Date Due  . FOOT EXAM  09/08/1940  . OPHTHALMOLOGY EXAM  09/08/1940  . URINE MICROALBUMIN  09/08/1940  . DEXA SCAN  09/07/1997  . HEMOGLOBIN A1C  11/19/2018  . INFLUENZA VACCINE  01/18/2019  . PNA vac Low Risk Adult  Completed   Fall Risk  08/30/2017 06/07/2017 12/22/2016 04/06/2016 11/27/2015  Falls in the past year? No No Yes Yes No  Number falls in past yr: - - 1 1 -  Injury with Fall? - - No Yes -  Risk for fall due to : - - - - -   Functional Status Survey:    Vitals:   10/29/18 1249  BP: 138/64  Pulse: 70  Resp: (!) 22  Temp: (!) 97.5 F (36.4 C)  SpO2: 94%  Weight: 295 lb (133.8 kg)  Height: $Remove'5\' 5"'eqDDcBi$  (1.651 m)   Body mass index is 49.09 kg/m. Physical Exam Vitals signs reviewed.  Constitutional:      Appearance: Normal appearance. She is obese.  HENT:     Head: Normocephalic.     Nose: Nose normal.     Mouth/Throat:     Mouth: Mucous membranes are moist.     Pharynx: Oropharynx is clear.  Eyes:     Pupils: Pupils are equal, round, and reactive to light.  Neck:     Musculoskeletal: Neck supple.  Cardiovascular:     Rate and Rhythm: Normal rate and regular rhythm.     Pulses: Normal pulses.     Heart sounds: Normal heart sounds. No murmur.  Pulmonary:     Effort: Pulmonary effort is normal. No respiratory distress.     Breath sounds: Normal breath sounds. No wheezing or rales.  Abdominal:     General: Abdomen is flat. Bowel sounds are normal. There is no distension.     Palpations: Abdomen is soft.     Tenderness: There is no abdominal tenderness. There is no guarding.  Musculoskeletal:     Comments: Has chronic Venous Changes Bilateral with Mild to Mod edema  Skin:    General: Skin is warm and dry.  Neurological:     Mental Status: She is alert and oriented to person, place, and time.     Comments: Has Mild weakness in Left LE   Psychiatric:        Mood and Affect: Mood normal.        Thought Content: Thought content normal.  Judgment: Judgment normal.     Labs reviewed: Recent Labs    01/01/18 0715 01/10/18 0000  05/20/18 0000 07/05/18 1248 07/05/18 1337 08/09/18 0533 08/10/18 0535  NA 142 141   < > 139 140 140 138 139  K 4.0 3.7   < > 3.6 3.2* 3.0* 3.8 4.1  CL 100 99   < > 100 100 96* 99 100  CO2 36* 33*   < > 31 29  --  30 31  GLUCOSE 115* 86   < > 114* 117* 114* 133* 103*  BUN 17 22   < > $R'21 19 20 23 10  'Hj$ CREATININE 0.86 0.89*   < > 0.92* 0.90 1.00 0.97 0.75  CALCIUM 8.0* 8.1*   < > 8.1* 8.2*  --  8.1* 7.5*  MG 1.8 1.7  --  1.6  --   --   --   --    < > = values in this interval not displayed.   Recent Labs    03/15/18 0540 04/23/18 1519 05/20/18 0000 08/10/18 0535  AST 14* $Remov'20 16 21  'TcNqrS$ ALT $Remo'13 12 11 11  'mAyFh$ ALKPHOS 74 84  --  79  BILITOT 0.4 0.7 0.5 0.9  PROT 6.1* 6.6 6.4 5.7*  ALBUMIN 3.0* 3.0*  --  2.8*   Recent Labs    05/06/18 0613  07/05/18 1248 07/05/18 1337 08/09/18 0533 08/10/18 0621  WBC 7.3   < > 6.3  --  7.3 5.4  NEUTROABS 5.4  --  4.7  --  4.6  --   HGB 12.5   < > 12.9 12.9 12.2 11.5*  HCT 40.2   < > 41.0 38.0 39.1 38.6  MCV 94.4   < > 92.3  --  96.3 98.2  PLT 123*   < > 142*  --  129* 133*   < > = values in this interval not displayed.   Lab Results  Component Value Date   TSH 5.40 (H) 07/18/2018   Lab Results  Component Value Date   HGBA1C 6.5 (H) 05/20/2018   Lab Results  Component Value Date   CHOL 153 03/05/2018   HDL 45 (L) 03/05/2018   LDLCALC 86 03/05/2018   LDLDIRECT 99.4 03/13/2012   TRIG 127 03/05/2018   CHOLHDL 3.4 03/05/2018    Significant Diagnostic Results in last 30 days:  No results found.  Assessment/Plan Shortness of breath Lung Exam normal Get Chest Xray Monitor POX especially at night Hallucinations Check POX tonight Also Checking CMP, and CBC Hold on UA for now Essential hypertension BP controlled on Lasix and  Metolazone Patient says she has been missing doses of Lasix recently Chronic diastolic CHF (congestive heart failure)  Continue on Lasix and Metolazone Last Echo was in 09/19 EF of 65% Repeat BMP  On Potassium Monitor her weight 3/week  Obesity hypoventilation syndrome (HCC) Does not use CPAP anymore On Chronic Oxygen at night Check POX at night at few hours Interval  Gastroesophageal reflux disease without esophagitis On Omeprzole  Hypothyroidism (acquired) Check TSH  Diabetes mellitus type 2 in obese (HCC) Not on Any Meds Last A1C was 6.5  CKD (chronic kidney disease) stage 3, GFR 30-59 ml/min (HCC) Repeat BMP  Vitamin B12 deficiency Continue Supplement Last Level in 01/20 Was WNL  Anxiety Seems like reason for her some of the symptoms On Zoloft Will continue to Monitor    Family/ staff Communication:   Labs/tests ordered:  CBC,CMP,TSH,Chest Xray  Total time spent in this patient  care encounter was  45_  minutes; greater than 50% of the visit spent counseling patient and staff, reviewing records , Labs and coordinating care for problems addressed at this encounter.

## 2018-10-30 ENCOUNTER — Encounter: Payer: Self-pay | Admitting: Nurse Practitioner

## 2018-10-30 ENCOUNTER — Non-Acute Institutional Stay: Payer: Medicare Other | Admitting: Nurse Practitioner

## 2018-10-30 DIAGNOSIS — E662 Morbid (severe) obesity with alveolar hypoventilation: Secondary | ICD-10-CM | POA: Diagnosis not present

## 2018-10-30 DIAGNOSIS — I5032 Chronic diastolic (congestive) heart failure: Secondary | ICD-10-CM | POA: Diagnosis not present

## 2018-10-30 DIAGNOSIS — R0602 Shortness of breath: Secondary | ICD-10-CM

## 2018-10-30 DIAGNOSIS — E039 Hypothyroidism, unspecified: Secondary | ICD-10-CM

## 2018-10-30 LAB — HEPATIC FUNCTION PANEL
ALT: 8 (ref 7–35)
AST: 14 (ref 13–35)
Alkaline Phosphatase: 78 (ref 25–125)
Bilirubin, Total: 0.4

## 2018-10-30 LAB — CBC AND DIFFERENTIAL
HCT: 39 (ref 36–46)
Hemoglobin: 12.6 (ref 12.0–16.0)
Platelets: 132 — AB (ref 150–399)
WBC: 7.3

## 2018-10-30 LAB — BASIC METABOLIC PANEL
BUN: 13 (ref 4–21)
Creatinine: 0.9 (ref 0.5–1.1)
Glucose: 114
Potassium: 3.9 (ref 3.4–5.3)
Sodium: 143 (ref 137–147)

## 2018-10-30 LAB — TSH: TSH: 20.08 — AB (ref 0.41–5.90)

## 2018-10-30 NOTE — Progress Notes (Signed)
This encounter was created in error - please disregard.

## 2018-10-30 NOTE — Progress Notes (Deleted)
Location:  [facility] Room Number: [place] of Service:  ALF [phone]) Provider:  Man Otho Darner, NP  Virgie Dad, MD  Patient Care Team: Virgie Dad, MD as PCP - General (Internal Medicine) Minus Breeding, MD as PCP - Cardiology (Cardiology) Azucena Fallen, MD as Consulting Physician (Obstetrics and Gynecology) Chesley Mires, MD as Consulting Physician (Pulmonary Disease) Mast, Man X, NP as Nurse Practitioner (Internal Medicine) Christin Fudge, MD as Consulting Physician (Surgery)  Extended Emergency Contact Information Primary Emergency Contact: Boxman,Lee Address: [address]          [place], [place] [address] Johnnette Litter of [place] Phone: [phone] Mobile Phone: [phone] Relation: Daughter Secondary Emergency Contact: Ritta Slot [place] of [place] Mobile Phone: [phone] Relation: Daughter  Code Status:  DNR Goals of care: Advanced Directive information Advanced Directives 10/30/2018  Does Patient Have a Medical Advance Directive? Yes  Type of Paramedic of [place];Living will;Out of facility DNR (pink MOST or yellow form)  Does patient want to make changes to medical advance directive? -  Copy of [place] in Chart? Yes - validated most recent copy scanned in chart (See row information)  Would patient like information on creating a medical advance directive? -  Pre-existing out of facility DNR order (yellow form or pink MOST form) Yellow form placed in chart (order not valid for inpatient use);Pink MOST form placed in chart (order not valid for inpatient use)     Chief Complaint  Patient presents with  . Acute Visit    Elevated TSH    HPI:  Pt is a 85 y.o. female seen today for an acute visit for    Past Medical History:  Diagnosis Date  . ARTHRITIS, KNEES, BILATERAL   . Breast CA ([facility]) 2000   s/p R lumpectomy and XRT  . DEPRESSION   . DJD (degenerative joint  disease) of knee   . GERD   . HYPERLIPIDEMIA   . HYPERTENSION   . HYPOTHYROIDISM    postsurgical  . Hypoxia   . MIGRAINE HEADACHE   . Morbid obesity ([facility])   . OSA (obstructive sleep apnea) 01/17/2011 dx  . Oxygen dependent   . Urge incontinence   . UTI (lower urinary tract infection) 12/2014   Past Surgical History:  Procedure Laterality Date  . ABDOMINAL HYSTERECTOMY    . ADENOIDECTOMY    . BREAST BIOPSY  2000  . BREAST LUMPECTOMY  2000  . CATARACT EXTRACTION    . CHOLECYSTECTOMY  1995  . CYSTOSCOPY/URETEROSCOPY/HOLMIUM LASER Right 06/28/2015   Procedure: CYSTOSCOPY RIGHT RETROGRAD RIGHT URETEROSCOPY/HOLMIUM LASER WITH RIGHT STENT PLACEMENT;  Surgeon: Alexis Frock, MD;  Location: [facility];  Service: Urology;  Laterality: Right;  . PARATHYROIDECTOMY     2-3 removed  . REPLACEMENT TOTAL KNEE BILATERAL  1999, 2005  . Right leg femur repaired    . THYROIDECTOMY    . TONSILLECTOMY    . TUBAL LIGATION      Allergies  Allergen Reactions  . Azithromycin Itching  . Beta Adrenergic Blockers Other (See Comments)    Depression   . Ciprofloxacin Other (See Comments)    Edgy and very jumpy    . Codeine Nausea And Vomiting  . Darvon [Propoxyphene] Nausea And Vomiting  . Epinephrine Other (See Comments)    Rapid pulse, sweats  . Klonopin [Clonazepam] Other (See Comments)    Makes her feel very suicidal   . Oxycodone Other (See Comments)  Patient felt like it altered her mental status "Crazy" . Hallucinations later as well.  [name] Paxil [Paroxetine Hydrochloride] Other (See Comments)    Severe depression   . Prednisone Other (See Comments)    "makes me feel really bad"  . Propoxyphene Hcl Nausea And Vomiting  . Tape Itching  . Torsemide Other (See Comments)    Patient stated that she doesn't recall the reaction  . Tramadol Other (See Comments)    Feels "jittery"  . Vicodin [Hydrocodone-Acetaminophen] Other (See Comments)    Hallucinations  . Meloxicam Diarrhea  . Other Rash     Polyester sheets "cause me a rash"  . Vancomycin Rash    Localized rash related to infusion rate    Outpatient Encounter Medications as of 10/30/2018  Medication Sig  . acetaminophen (TYLENOL) 325 MG tablet Take 650 mg by mouth every 8 (eight) hours as needed for mild pain or moderate pain.   [name] aspirin 81 MG tablet Take 81 mg by mouth daily.   . cetirizine (ZYRTEC) 10 MG chewable tablet Chew 10 mg by mouth daily.  . fluticasone (FLONASE) 50 MCG/ACT nasal spray Place 2 sprays into both nostrils daily.  [name] levothyroxine (SYNTHROID) 150 MCG tablet Take 150 mcg by mouth daily before breakfast. Patient is to use name brand medication (Synthroid only)  . Melatonin 5 MG TABS Take 5 mg by mouth at bedtime.  . metolazone (ZAROXOLYN) 2.5 MG tablet Take 2.5 mg by mouth daily.  [name] omeprazole (PRILOSEC) 20 MG capsule Take 20 mg by mouth 2 (two) times daily before a meal.   . ondansetron (ZOFRAN) 4 MG tablet TAKE (1) TABLET BY MOUTH EVERY SIX HOURS AS NEEDED FOR NAUSEA.  [name] potassium chloride SA (K-DUR) 20 MEQ tablet Take 60 mEq by mouth 2 (two) times daily.  [name] saccharomyces boulardii (FLORASTOR) 250 MG capsule Take 250 mg by mouth daily.  . sertraline (ZOLOFT) 50 MG tablet Take 1.5 tablets (75 mg total) by mouth daily.  . vitamin B-12 (CYANOCOBALAMIN) 1000 MCG tablet Take 2,000 mcg by mouth daily. 2 tabs  . [DISCONTINUED] Calcium Polycarbophil (FIBER-CAPS PO) Take 2 capsules by mouth daily.  . [DISCONTINUED] furosemide (LASIX) 40 MG tablet Take 40 mg by mouth daily.  . [DISCONTINUED] potassium chloride (MICRO-K) 10 MEQ CR capsule Take 10 mEq by mouth 4 (four) times daily.  . [DISCONTINUED] tamsulosin (FLOMAX) 0.4 MG CAPS capsule Take 0.4 mg by mouth at bedtime.   No facility-administered encounter medications on file as of 10/30/2018.     Review of Systems  Immunization History  Administered Date(s) Administered  . Influenza Split 03/20/2011  . Influenza Whole 03/19/2009, 03/19/2012  . Influenza,  High Dose Seasonal PF 03/16/2018  . Influenza,inj,Quad PF,6+ Mos 03/19/2013  . Influenza-Unspecified 03/03/2014, 03/15/2015, 03/30/2016, 04/05/2017  . Meningococcal Polysaccharide 03/15/2015  . PPD Test 03/30/2016  . Pneumococcal Conjugate-13 03/12/2014  . Pneumococcal Polysaccharide-23 06/19/2006, 03/15/2015  . Td 10/04/2009  . Tdap 06/19/2005  . Zoster 06/19/2010  . Zoster Recombinat (Shingrix) 01/28/2018, 04/22/2018, 05/22/2018   Pertinent  Health Maintenance Due  Topic Date Due  . FOOT EXAM  09/08/1940  . OPHTHALMOLOGY EXAM  09/08/1940  . URINE MICROALBUMIN  09/08/1940  . DEXA SCAN  09/07/1997  . HEMOGLOBIN A1C  11/19/2018  . INFLUENZA VACCINE  01/18/2019  . PNA vac Low Risk Adult  Completed   Fall Risk  08/30/2017 06/07/2017 12/22/2016 04/06/2016 11/27/2015  Falls in the past year? No No Yes Yes No  Number falls in past yr: - -  1 1 -  Injury with Fall? - - No Yes -  Risk for fall due to : - - - - -   Functional Status Survey:    Vitals:   10/30/18 1424  BP: (!) 158/80  Pulse: 82  Resp: 18  Temp: 98.5 F (36.9 C)  TempSrc: Oral  SpO2: 92%  Weight: 292 lb 12.8 oz (132.8 kg)  Height: $Remove'5\' 5"'AipMYRt$  (1.651 m)   Body mass index is 48.72 kg/m. Physical Exam  Labs reviewed: Recent Labs    01/01/18 0715 01/10/18 0000  05/20/18 0000 07/05/18 1248 07/05/18 1337 08/09/18 0533 08/10/18 0535  NA 142 141   < > 139 140 140 138 139  K 4.0 3.7   < > 3.6 3.2* 3.0* 3.8 4.1  CL 100 99   < > 100 100 96* 99 100  CO2 36* 33*   < > 31 29  --  30 31  GLUCOSE 115* 86   < > 114* 117* 114* 133* 103*  BUN 17 22   < > $R'21 19 20 23 10  'tT$ CREATININE 0.86 0.89*   < > 0.92* 0.90 1.00 0.97 0.75  CALCIUM 8.0* 8.1*   < > 8.1* 8.2*  --  8.1* 7.5*  MG 1.8 1.7  --  1.6  --   --   --   --    < > = values in this interval not displayed.   Recent Labs    03/15/18 0540 04/23/18 1519 05/20/18 0000 08/10/18 0535  AST 14* $Remov'20 16 21  'dGTliC$ ALT $Remo'13 12 11 11  'IznbD$ ALKPHOS 74 84  --  79  BILITOT 0.4 0.7 0.5 0.9   PROT 6.1* 6.6 6.4 5.7*  ALBUMIN 3.0* 3.0*  --  2.8*   Recent Labs    05/06/18 0613  07/05/18 1248 07/05/18 1337 08/09/18 0533 08/10/18 0621  WBC 7.3   < > 6.3  --  7.3 5.4  NEUTROABS 5.4  --  4.7  --  4.6  --   HGB 12.5   < > 12.9 12.9 12.2 11.5*  HCT 40.2   < > 41.0 38.0 39.1 38.6  MCV 94.4   < > 92.3  --  96.3 98.2  PLT 123*   < > 142*  --  129* 133*   < > = values in this interval not displayed.   Lab Results  Component Value Date   TSH 5.40 (H) 07/18/2018   Lab Results  Component Value Date   HGBA1C 6.5 (H) 05/20/2018   Lab Results  Component Value Date   CHOL 153 03/05/2018   HDL 45 (L) 03/05/2018   LDLCALC 86 03/05/2018   LDLDIRECT 99.4 03/13/2012   TRIG 127 03/05/2018   CHOLHDL 3.4 03/05/2018    Significant Diagnostic Results in last 30 days:  No results found.  Assessment/Plan There are no diagnoses linked to this encounter.   Family/ staff Communication: ***  Labs/tests ordered:  ***

## 2018-10-30 NOTE — Assessment & Plan Note (Signed)
10/29/18 CXR no evidence of acute pulmonary disease

## 2018-10-30 NOTE — Assessment & Plan Note (Signed)
10/29/18 TSH 20.08, wbc 7.3, Hgb 12.6, plt 132, neutrophils 72.4, Na 143, K 3.9, Bun 13, creat 0.92, TP 5.7, albumin 3.3 10/30/18, Dr. Lyndel Safe advise to continue Levothyroxine 168mcg qd,  f/u TSH in 8 weeks due to the poor compliance of taking meds at home.

## 2018-10-30 NOTE — Assessment & Plan Note (Signed)
Chronic edema BLE, continue Metolazone 2.5mg  qd, Furosemide 40mg  qd.

## 2018-10-30 NOTE — Assessment & Plan Note (Signed)
Chronic SOB/DOE.

## 2018-10-30 NOTE — Progress Notes (Signed)
Location:   [facility]   Place of Service:   [facility]  Provider: [facility] Mast NP  Virgie Dad, MD  Patient Care Team: Virgie Dad, MD as PCP - General (Internal Medicine) Minus Breeding, MD as PCP - Cardiology (Cardiology) Azucena Fallen, MD as Consulting Physician (Obstetrics and Gynecology) Chesley Mires, MD as Consulting Physician (Pulmonary Disease) Mast, Man X, NP as Nurse Practitioner (Internal Medicine) Christin Fudge, MD as Consulting Physician (Surgery)  Extended Emergency Contact Information Primary Emergency Contact: Boxman,Lee Address: [address]          [place], [place] [address] Johnnette Litter of [place] Phone: [phone] Mobile Phone: [phone] Relation: Daughter Secondary Emergency Contact: Ritta Slot [place] of [place] Mobile Phone: [phone] Relation: Daughter  Code Status: DNR Goals of care: Advanced Directive information Advanced Directives 10/30/2018  Does Patient Have a Medical Advance Directive? Yes  Type of Paramedic of [place];Living will;Out of facility DNR (pink MOST or yellow form)  Does patient want to make changes to medical advance directive? -  Copy of [place] in Chart? Yes - validated most recent copy scanned in chart (See row information)  Would patient like information on creating a medical advance directive? -  Pre-existing out of facility DNR order (yellow form or pink MOST form) Yellow form placed in chart (order not valid for inpatient use);Pink MOST form placed in chart (order not valid for inpatient use)     No chief complaint on file.   HPI:  Pt is a 85 y.o. female seen today for an acute visit for the patient was found to have elevated TSH 20.8 10/29/18, Hx of hypothyroidism, on Levothyroxine 148mcg. The patient was a poor compliance of meds at home prior to [facility]. Her CXR 10/29/18 showed no acute pulmonary disease. She has chronic c/o SOB in setting of CHF and  obesity hypoventilation syndrome. On Metolazone 2.$RemoveBeforeD'5mg'cpkjZMkJIBsLZy$  qd, Furosemide $RemoveBefore'40mg'fczCPhQXJpcIV$  qd.    Past Medical History:  Diagnosis Date   ARTHRITIS, KNEES, BILATERAL    Breast CA ([place]) 2000   s/p R lumpectomy and XRT   DEPRESSION    DJD (degenerative joint disease) of knee    GERD    HYPERLIPIDEMIA    HYPERTENSION    HYPOTHYROIDISM    postsurgical   Hypoxia    MIGRAINE HEADACHE    Morbid obesity (HCC)    OSA (obstructive sleep apnea) 01/17/2011 dx   Oxygen dependent    Urge incontinence    UTI (lower urinary tract infection) 12/2014   Past Surgical History:  Procedure Laterality Date   ABDOMINAL HYSTERECTOMY     ADENOIDECTOMY     BREAST BIOPSY  2000   BREAST LUMPECTOMY  2000   CATARACT EXTRACTION     CHOLECYSTECTOMY  1995   CYSTOSCOPY/URETEROSCOPY/HOLMIUM LASER Right 06/28/2015   Procedure: CYSTOSCOPY RIGHT RETROGRAD RIGHT URETEROSCOPY/HOLMIUM LASER WITH RIGHT STENT PLACEMENT;  Surgeon: Alexis Frock, MD;  Location: [facility];  Service: Urology;  Laterality: Right;   PARATHYROIDECTOMY     2-3 removed   REPLACEMENT TOTAL KNEE BILATERAL  1999, 2005   Right leg femur repaired     THYROIDECTOMY     TONSILLECTOMY     TUBAL LIGATION      Allergies  Allergen Reactions   Azithromycin Itching   Beta Adrenergic Blockers Other (See Comments)    Depression    Ciprofloxacin Other (See Comments)    Edgy and very jumpy     Codeine Nausea And Vomiting  Darvon [Propoxyphene] Nausea And Vomiting   Epinephrine Other (See Comments)    Rapid pulse, sweats   Klonopin [Clonazepam] Other (See Comments)    Makes her feel very suicidal    Oxycodone Other (See Comments)    Patient felt like it altered her mental status "Crazy" . Hallucinations later as well.   Paxil [Paroxetine Hydrochloride] Other (See Comments)    Severe depression    Prednisone Other (See Comments)    "makes me feel really bad"   Propoxyphene Hcl Nausea And Vomiting   Tape Itching    Torsemide Other (See Comments)    Patient stated that she doesn't recall the reaction   Tramadol Other (See Comments)    Feels "jittery"   Vicodin [Hydrocodone-Acetaminophen] Other (See Comments)    Hallucinations   Meloxicam Diarrhea   Other Rash    Polyester sheets "cause me a rash"   Vancomycin Rash    Localized rash related to infusion rate    Allergies as of 10/30/2018      Reactions   Azithromycin Itching   Beta Adrenergic Blockers Other (See Comments)   Depression   Ciprofloxacin Other (See Comments)   Edgy and very jumpy    Codeine Nausea And Vomiting   Darvon [propoxyphene] Nausea And Vomiting   Epinephrine Other (See Comments)   Rapid pulse, sweats   Klonopin [clonazepam] Other (See Comments)   Makes her feel very suicidal    Oxycodone Other (See Comments)   Patient felt like it altered her mental status "Crazy" . Hallucinations later as well.   Paxil [paroxetine Hydrochloride] Other (See Comments)   Severe depression    Prednisone Other (See Comments)   "makes me feel really bad"   Propoxyphene Hcl Nausea And Vomiting   Tape Itching   Torsemide Other (See Comments)   Patient stated that she doesn't recall the reaction   Tramadol Other (See Comments)   Feels "jittery"   Vicodin [hydrocodone-acetaminophen] Other (See Comments)   Hallucinations   Meloxicam Diarrhea   Other Rash   Polyester sheets "cause me a rash"   Vancomycin Rash   Localized rash related to infusion rate      Medication List       Accurate as of Oct 30, 2018  4:14 PM. If you have any questions, ask your nurse or doctor.        STOP taking these medications   FIBER-CAPS PO Stopped by:  Man X Mast, NP   furosemide 40 MG tablet Commonly known as:  LASIX Stopped by:  Man X Mast, NP   potassium chloride 10 MEQ CR capsule Commonly known as:  MICRO-K Stopped by:  Man X Mast, NP   tamsulosin 0.4 MG Caps capsule Commonly known as:  FLOMAX Stopped by:  Man X Mast, NP     TAKE  these medications   acetaminophen 325 MG tablet Commonly known as:  TYLENOL Take 650 mg by mouth every 8 (eight) hours as needed for mild pain or moderate pain.   aspirin 81 MG tablet Take 81 mg by mouth daily.   cetirizine 10 MG chewable tablet Commonly known as:  ZYRTEC Chew 10 mg by mouth daily.   fluticasone 50 MCG/ACT nasal spray Commonly known as:  FLONASE Place 2 sprays into both nostrils daily.   Melatonin 5 MG Tabs Take 5 mg by mouth at bedtime.   metolazone 2.5 MG tablet Commonly known as:  ZAROXOLYN Take 2.5 mg by mouth daily.   omeprazole 20 MG capsule  Commonly known as:  PRILOSEC Take 20 mg by mouth 2 (two) times daily before a meal.   ondansetron 4 MG tablet Commonly known as:  ZOFRAN TAKE (1) TABLET BY MOUTH EVERY SIX HOURS AS NEEDED FOR NAUSEA.   potassium chloride SA 20 MEQ tablet Commonly known as:  K-DUR Take 60 mEq by mouth 2 (two) times daily.   saccharomyces boulardii 250 MG capsule Commonly known as:  FLORASTOR Take 250 mg by mouth daily.   sertraline 50 MG tablet Commonly known as:  Zoloft Take 1.5 tablets (75 mg total) by mouth daily.   Synthroid 150 MCG tablet Generic drug:  levothyroxine Take 150 mcg by mouth daily before breakfast. Patient is to use name brand medication (Synthroid only)   vitamin B-12 1000 MCG tablet Commonly known as:  CYANOCOBALAMIN Take 2,000 mcg by mouth daily. 2 tabs       Review of Systems  Constitutional: Positive for fatigue. Negative for activity change, appetite change, chills, diaphoresis and fever.  HENT: Positive for hearing loss. Negative for congestion and voice change.   Respiratory: Positive for cough and shortness of breath. Negative for wheezing.   Cardiovascular: Positive for leg swelling. Negative for chest pain and palpitations.  Gastrointestinal: Positive for diarrhea. Negative for abdominal distention, abdominal pain, constipation, nausea and vomiting.  Genitourinary: Negative for  difficulty urinating, dysuria and urgency.  Musculoskeletal: Positive for back pain and gait problem. Negative for arthralgias.  Skin: Negative for color change and pallor.       Chronic lipo dermatosclerosis changes BLE.   Neurological: Negative for dizziness, speech difficulty, weakness and headaches.       Memory lapses.   Psychiatric/Behavioral: Positive for sleep disturbance. Negative for agitation, behavioral problems and hallucinations. The patient is nervous/anxious.     Immunization History  Administered Date(s) Administered   Influenza Split 03/20/2011   Influenza Whole 03/19/2009, 03/19/2012   Influenza, High Dose Seasonal PF 03/16/2018   Influenza,inj,Quad PF,6+ Mos 03/19/2013   Influenza-Unspecified 03/03/2014, 03/15/2015, 03/30/2016, 04/05/2017   Meningococcal Polysaccharide 03/15/2015   PPD Test 03/30/2016   Pneumococcal Conjugate-13 03/12/2014   Pneumococcal Polysaccharide-23 06/19/2006, 03/15/2015   Td 10/04/2009   Tdap 06/19/2005   Zoster 06/19/2010   Zoster Recombinat (Shingrix) 01/28/2018, 04/22/2018, 05/22/2018   Pertinent  Health Maintenance Due  Topic Date Due   FOOT EXAM  09/08/1940   OPHTHALMOLOGY EXAM  09/08/1940   URINE MICROALBUMIN  09/08/1940   DEXA SCAN  09/07/1997   HEMOGLOBIN A1C  11/19/2018   INFLUENZA VACCINE  01/18/2019   PNA vac Low Risk Adult  Completed   Fall Risk  08/30/2017 06/07/2017 12/22/2016 04/06/2016 11/27/2015  Falls in the past year? No No Yes Yes No  Number falls in past yr: - - 1 1 -  Injury with Fall? - - No Yes -  Risk for fall due to : - - - - -   Functional Status Survey:    There were no vitals filed for this visit. There is no height or weight on file to calculate BMI. Physical Exam Vitals signs and nursing note reviewed.  Constitutional:      General: She is not in acute distress.    Appearance: Normal appearance. She is obese. She is not ill-appearing or toxic-appearing.  HENT:     Head:  Normocephalic and atraumatic.     Nose: Nose normal.     Mouth/Throat:     Mouth: Mucous membranes are moist.  Eyes:     Extraocular Movements: Extraocular movements  intact.     Conjunctiva/sclera: Conjunctivae normal.     Pupils: Pupils are equal, round, and reactive to light.  Neck:     Musculoskeletal: Normal range of motion and neck supple.  Cardiovascular:     Rate and Rhythm: Normal rate and regular rhythm.     Heart sounds: No murmur.  Pulmonary:     Effort: Pulmonary effort is normal.     Breath sounds: Rales present. No wheezing or rhonchi.     Comments: Bibasilar rales.  Abdominal:     General: There is no distension.     Palpations: Abdomen is soft.     Tenderness: There is no abdominal tenderness. There is no left CVA tenderness, guarding or rebound.  Musculoskeletal:     Right lower leg: Edema present.     Left lower leg: Edema present.     Comments: Chronic edema 1+ BLE. Left groin pain since her leg got caught in scooter, but no worsened pain with weight bearing.   Skin:    General: Skin is warm and dry.     Comments: Lipo dermatosclerosis skin changes in BLE  Neurological:     General: No focal deficit present.     Mental Status: She is alert and oriented to person, place, and time. Mental status is at baseline.     Cranial Nerves: No cranial nerve deficit.     Motor: No weakness.     Coordination: Coordination normal.     Gait: Gait abnormal.  Psychiatric:        Mood and Affect: Mood normal.        Behavior: Behavior normal.        Thought Content: Thought content normal.     Labs reviewed: Recent Labs    01/01/18 0715 01/10/18 0000  05/20/18 0000 07/05/18 1248 07/05/18 1337 08/09/18 0533 08/10/18 0535  NA 142 141   < > 139 140 140 138 139  K 4.0 3.7   < > 3.6 3.2* 3.0* 3.8 4.1  CL 100 99   < > 100 100 96* 99 100  CO2 36* 33*   < > 31 29  --  30 31  GLUCOSE 115* 86   < > 114* 117* 114* 133* 103*  BUN 17 22   < > $R'21 19 20 23 10  'Ri$ CREATININE  0.86 0.89*   < > 0.92* 0.90 1.00 0.97 0.75  CALCIUM 8.0* 8.1*   < > 8.1* 8.2*  --  8.1* 7.5*  MG 1.8 1.7  --  1.6  --   --   --   --    < > = values in this interval not displayed.   Recent Labs    03/15/18 0540 04/23/18 1519 05/20/18 0000 08/10/18 0535  AST 14* $Remov'20 16 21  'NSXYtA$ ALT $Remo'13 12 11 11  'qibPz$ ALKPHOS 74 84  --  79  BILITOT 0.4 0.7 0.5 0.9  PROT 6.1* 6.6 6.4 5.7*  ALBUMIN 3.0* 3.0*  --  2.8*   Recent Labs    05/06/18 0613  07/05/18 1248 07/05/18 1337 08/09/18 0533 08/10/18 0621  WBC 7.3   < > 6.3  --  7.3 5.4  NEUTROABS 5.4  --  4.7  --  4.6  --   HGB 12.5   < > 12.9 12.9 12.2 11.5*  HCT 40.2   < > 41.0 38.0 39.1 38.6  MCV 94.4   < > 92.3  --  96.3 98.2  PLT 123*   < >  142*  --  129* 133*   < > = values in this interval not displayed.   Lab Results  Component Value Date   TSH 5.40 (H) 07/18/2018   Lab Results  Component Value Date   HGBA1C 6.5 (H) 05/20/2018   Lab Results  Component Value Date   CHOL 153 03/05/2018   HDL 45 (L) 03/05/2018   LDLCALC 86 03/05/2018   LDLDIRECT 99.4 03/13/2012   TRIG 127 03/05/2018   CHOLHDL 3.4 03/05/2018    Significant Diagnostic Results in last 30 days:  No results found.  Assessment/Plan: Hypothyroidism (acquired) 10/29/18 TSH 20.08, wbc 7.3, Hgb 12.6, plt 132, neutrophils 72.4, Na 143, K 3.9, Bun 13, creat 0.92, TP 5.7, albumin 3.3 10/30/18, Dr. Lyndel Safe advise to continue Levothyroxine 169mcg qd,  f/u TSH in 8 weeks due to the poor compliance of taking meds at home.   Dyspnea 10/29/18 CXR no evidence of acute pulmonary disease  Obesity hypoventilation syndrome (HCC) Chronic SOB/DOE.   Chronic diastolic CHF (congestive heart failure) (HCC) Chronic edema BLE, continue Metolazone 2.5mg  qd, Furosemide 40mg  qd.     Family/ staff Communication: the plan of care reviewed with the patient and charge nurse.   Labs/tests ordered:  TSH 8 weeks.   Time spend 40 minutes.

## 2018-10-31 ENCOUNTER — Encounter: Payer: Medicare Other | Admitting: Nurse Practitioner

## 2018-10-31 ENCOUNTER — Other Ambulatory Visit: Payer: Self-pay

## 2018-11-06 ENCOUNTER — Encounter (HOSPITAL_COMMUNITY): Payer: Self-pay

## 2018-11-06 ENCOUNTER — Non-Acute Institutional Stay: Payer: Medicare Other | Admitting: Nurse Practitioner

## 2018-11-06 ENCOUNTER — Emergency Department (HOSPITAL_COMMUNITY)
Admission: EM | Admit: 2018-11-06 | Discharge: 2018-11-06 | Disposition: A | Discharge status: Home / Self Care | Payer: Medicare Other | Attending: Emergency Medicine | Admitting: Emergency Medicine

## 2018-11-06 ENCOUNTER — Encounter: Payer: Self-pay | Admitting: Nurse Practitioner

## 2018-11-06 ENCOUNTER — Emergency Department (HOSPITAL_COMMUNITY): Payer: Medicare Other

## 2018-11-06 ENCOUNTER — Other Ambulatory Visit: Payer: Self-pay

## 2018-11-06 DIAGNOSIS — Z87891 Personal history of nicotine dependence: Secondary | ICD-10-CM | POA: Diagnosis not present

## 2018-11-06 DIAGNOSIS — E039 Hypothyroidism, unspecified: Secondary | ICD-10-CM | POA: Insufficient documentation

## 2018-11-06 DIAGNOSIS — Z79899 Other long term (current) drug therapy: Secondary | ICD-10-CM | POA: Insufficient documentation

## 2018-11-06 DIAGNOSIS — R197 Diarrhea, unspecified: Secondary | ICD-10-CM | POA: Insufficient documentation

## 2018-11-06 DIAGNOSIS — R5383 Other fatigue: Secondary | ICD-10-CM | POA: Insufficient documentation

## 2018-11-06 DIAGNOSIS — R0602 Shortness of breath: Secondary | ICD-10-CM | POA: Diagnosis not present

## 2018-11-06 DIAGNOSIS — R531 Weakness: Secondary | ICD-10-CM

## 2018-11-06 DIAGNOSIS — K219 Gastro-esophageal reflux disease without esophagitis: Secondary | ICD-10-CM

## 2018-11-06 DIAGNOSIS — I130 Hypertensive heart and chronic kidney disease with heart failure and stage 1 through stage 4 chronic kidney disease, or unspecified chronic kidney disease: Secondary | ICD-10-CM | POA: Diagnosis not present

## 2018-11-06 DIAGNOSIS — I5032 Chronic diastolic (congestive) heart failure: Secondary | ICD-10-CM

## 2018-11-06 DIAGNOSIS — R05 Cough: Secondary | ICD-10-CM | POA: Diagnosis not present

## 2018-11-06 DIAGNOSIS — E119 Type 2 diabetes mellitus without complications: Secondary | ICD-10-CM | POA: Diagnosis not present

## 2018-11-06 DIAGNOSIS — I959 Hypotension, unspecified: Secondary | ICD-10-CM | POA: Diagnosis not present

## 2018-11-06 DIAGNOSIS — R110 Nausea: Secondary | ICD-10-CM | POA: Diagnosis not present

## 2018-11-06 DIAGNOSIS — N183 Chronic kidney disease, stage 3 (moderate): Secondary | ICD-10-CM | POA: Diagnosis not present

## 2018-11-06 DIAGNOSIS — Z7982 Long term (current) use of aspirin: Secondary | ICD-10-CM | POA: Diagnosis not present

## 2018-11-06 DIAGNOSIS — J9611 Chronic respiratory failure with hypoxia: Secondary | ICD-10-CM | POA: Diagnosis not present

## 2018-11-06 DIAGNOSIS — Z743 Need for continuous supervision: Secondary | ICD-10-CM | POA: Diagnosis not present

## 2018-11-06 DIAGNOSIS — R279 Unspecified lack of coordination: Secondary | ICD-10-CM | POA: Diagnosis not present

## 2018-11-06 DIAGNOSIS — F418 Other specified anxiety disorders: Secondary | ICD-10-CM | POA: Diagnosis not present

## 2018-11-06 DIAGNOSIS — Z209 Contact with and (suspected) exposure to unspecified communicable disease: Secondary | ICD-10-CM | POA: Diagnosis not present

## 2018-11-06 DIAGNOSIS — I10 Essential (primary) hypertension: Secondary | ICD-10-CM | POA: Diagnosis not present

## 2018-11-06 HISTORY — DX: Heart failure, unspecified: I50.9

## 2018-11-06 LAB — URINALYSIS, ROUTINE W REFLEX MICROSCOPIC
Bilirubin Urine: NEGATIVE
Glucose, UA: NEGATIVE mg/dL
Hgb urine dipstick: NEGATIVE
Ketones, ur: NEGATIVE mg/dL
Nitrite: NEGATIVE
Protein, ur: NEGATIVE mg/dL
Specific Gravity, Urine: 1.008 (ref 1.005–1.030)
pH: 6 (ref 5.0–8.0)

## 2018-11-06 LAB — COMPREHENSIVE METABOLIC PANEL
ALT: 10 U/L (ref 0–44)
AST: 13 U/L — ABNORMAL LOW (ref 15–41)
Albumin: 2.7 g/dL — ABNORMAL LOW (ref 3.5–5.0)
Alkaline Phosphatase: 69 U/L (ref 38–126)
Anion gap: 8 (ref 5–15)
BUN: 7 mg/dL — ABNORMAL LOW (ref 8–23)
CO2: 32 mmol/L (ref 22–32)
Calcium: 7.9 mg/dL — ABNORMAL LOW (ref 8.9–10.3)
Chloride: 100 mmol/L (ref 98–111)
Creatinine, Ser: 0.99 mg/dL (ref 0.44–1.00)
GFR calc Af Amer: 60 mL/min — ABNORMAL LOW (ref >60)
GFR calc non Af Amer: 52 mL/min — ABNORMAL LOW (ref >60)
Glucose, Bld: 95 mg/dL (ref 70–99)
Potassium: 3.9 mmol/L (ref 3.5–5.1)
Sodium: 140 mmol/L (ref 135–145)
Total Bilirubin: 0.6 mg/dL (ref 0.3–1.2)
Total Protein: 5.7 g/dL — ABNORMAL LOW (ref 6.5–8.1)

## 2018-11-06 LAB — CBC WITH DIFFERENTIAL/PLATELET
Abs Immature Granulocytes: 0.03 10*3/uL (ref 0.00–0.07)
Basophils Absolute: 0 10*3/uL (ref 0.0–0.1)
Basophils Relative: 1 %
Eosinophils Absolute: 0.2 10*3/uL (ref 0.0–0.5)
Eosinophils Relative: 2 %
HCT: 37.7 % (ref 36.0–46.0)
Hemoglobin: 11.7 g/dL — ABNORMAL LOW (ref 12.0–15.0)
Immature Granulocytes: 1 %
Lymphocytes Relative: 17 %
Lymphs Abs: 1.1 10*3/uL (ref 0.7–4.0)
MCH: 28.6 pg (ref 26.0–34.0)
MCHC: 31 g/dL (ref 30.0–36.0)
MCV: 92.2 fL (ref 80.0–100.0)
Monocytes Absolute: 0.6 10*3/uL (ref 0.1–1.0)
Monocytes Relative: 9 %
Neutro Abs: 4.7 10*3/uL (ref 1.7–7.7)
Neutrophils Relative %: 70 %
Platelets: 140 10*3/uL — ABNORMAL LOW (ref 150–400)
RBC: 4.09 MIL/uL (ref 3.87–5.11)
RDW: 15.6 % — ABNORMAL HIGH (ref 11.5–15.5)
WBC: 6.6 10*3/uL (ref 4.0–10.5)
nRBC: 0 % (ref 0.0–0.2)

## 2018-11-06 LAB — BRAIN NATRIURETIC PEPTIDE: B Natriuretic Peptide: 190.1 pg/mL — ABNORMAL HIGH (ref 0.0–100.0)

## 2018-11-06 LAB — TROPONIN I: Troponin I: <0.03 ng/mL (ref <0.03)

## 2018-11-06 MED ORDER — SODIUM CHLORIDE 0.9 % IV BOLUS
500.0000 mL | Freq: Once | INTRAVENOUS | Status: AC
  Administered 2018-11-06: 500 mL via INTRAVENOUS

## 2018-11-06 NOTE — ED Notes (Signed)
Patient spoke with daughter on phone. To be discharged back to [facility] via [place] once fluids finish.

## 2018-11-06 NOTE — ED Provider Notes (Signed)
Chest x-ray is without edema and BNP is less than 200.  While is in a gray zone, she does not likely have significant CHF at this time.  I think with all the diarrhea and fatigue, she could benefit from a small bolus of fluids.  Otherwise urinalysis does not appear consistent with UTI and labs are reassuring.  Discharge home.  Results for orders placed or performed during the hospital encounter of 11/06/18  Comprehensive metabolic panel  Result Value Ref Range   Sodium 140 135 - 145 mmol/L   Potassium 3.9 3.5 - 5.1 mmol/L   Chloride 100 98 - 111 mmol/L   CO2 32 22 - 32 mmol/L   Glucose, Bld 95 70 - 99 mg/dL   BUN 7 (L) 8 - 23 mg/dL   Creatinine, Ser 0.99 0.44 - 1.00 mg/dL   Calcium 7.9 (L) 8.9 - 10.3 mg/dL   Total Protein 5.7 (L) 6.5 - 8.1 g/dL   Albumin 2.7 (L) 3.5 - 5.0 g/dL   AST 13 (L) 15 - 41 U/L   ALT 10 0 - 44 U/L   Alkaline Phosphatase 69 38 - 126 U/L   Total Bilirubin 0.6 0.3 - 1.2 mg/dL   GFR calc non Af Amer 52 (L) >60 mL/min   GFR calc Af Amer 60 (L) >60 mL/min   Anion gap 8 5 - 15  CBC with Differential  Result Value Ref Range   WBC 6.6 4.0 - 10.5 K/uL   RBC 4.09 3.87 - 5.11 MIL/uL   Hemoglobin 11.7 (L) 12.0 - 15.0 g/dL   HCT 37.7 36.0 - 46.0 %   MCV 92.2 80.0 - 100.0 fL   MCH 28.6 26.0 - 34.0 pg   MCHC 31.0 30.0 - 36.0 g/dL   RDW 15.6 (H) 11.5 - 15.5 %   Platelets 140 (L) 150 - 400 K/uL   nRBC 0.0 0.0 - 0.2 %   Neutrophils Relative % 70 %   Neutro Abs 4.7 1.7 - 7.7 K/uL   Lymphocytes Relative 17 %   Lymphs Abs 1.1 0.7 - 4.0 K/uL   Monocytes Relative 9 %   Monocytes Absolute 0.6 0.1 - 1.0 K/uL   Eosinophils Relative 2 %   Eosinophils Absolute 0.2 0.0 - 0.5 K/uL   Basophils Relative 1 %   Basophils Absolute 0.0 0.0 - 0.1 K/uL   Immature Granulocytes 1 %   Abs Immature Granulocytes 0.03 0.00 - 0.07 K/uL  Brain natriuretic peptide  Result Value Ref Range   B Natriuretic Peptide 190.1 (H) 0.0 - 100.0 pg/mL  Troponin I - Once  Result Value Ref Range   Troponin I <0.03 <0.03 ng/mL  Urinalysis, Routine w reflex microscopic  Result Value Ref Range   Color, Urine YELLOW YELLOW   APPearance CLEAR CLEAR   Specific Gravity, Urine 1.008 1.005 - 1.030   pH 6.0 5.0 - 8.0   Glucose, UA NEGATIVE NEGATIVE mg/dL   Hgb urine dipstick NEGATIVE NEGATIVE   Bilirubin Urine NEGATIVE NEGATIVE   Ketones, ur NEGATIVE NEGATIVE mg/dL   Protein, ur NEGATIVE NEGATIVE mg/dL   Nitrite NEGATIVE NEGATIVE   Leukocytes,Ua SMALL (A) NEGATIVE   RBC / HPF 0-5 0 - 5 RBC/hpf   WBC, UA 0-5 0 - 5 WBC/hpf   Bacteria, UA RARE (A) NONE SEEN   Squamous Epithelial / LPF 0-5 0 - 5   Mucus PRESENT    Dg Chest Portable 1 View  Result Date: 11/06/2018 CLINICAL DATA:  Shortness of breath and cough  for 1 month. EXAM: PORTABLE CHEST 1 VIEW COMPARISON:  Chest x-ray August 09, 2018 FINDINGS: The heart size and mediastinal contours are stable. The heart size is enlarged. Both lungs are clear. The visualized skeletal structures are unremarkable. IMPRESSION: No active cardiopulmonary disease. Electronically Signed   By: Abelardo Diesel M.D.   On: 11/06/2018 13:42      Sherwood Gambler, MD 11/06/18 1705

## 2018-11-06 NOTE — ED Notes (Signed)
Phlebotomist at pt bedside drawing labs.

## 2018-11-06 NOTE — Assessment & Plan Note (Signed)
In setting of IBS, the patient declined CBC/diff, CMP, C-diff toxin A/B at [place], she requested to ED eval.

## 2018-11-06 NOTE — Progress Notes (Signed)
Location:  [facility] Room Number: 907 Place of Service:  ALF [phone]) Provider: Coreon Simkins Otho Darner, NP   Virgie Dad, MD  Patient Care Team: Virgie Dad, MD as PCP - General (Internal Medicine) Minus Breeding, MD as PCP - Cardiology (Cardiology) Azucena Fallen, MD as Consulting Physician (Obstetrics and Gynecology) Chesley Mires, MD as Consulting Physician (Pulmonary Disease) Saurabh Hettich X, NP as Nurse Practitioner (Internal Medicine) Christin Fudge, MD as Consulting Physician (Surgery)  Extended Emergency Contact Information Primary Emergency Contact: Boxman,Lee Address: [address]          [place], [place] [address] Johnnette Litter of [place] Phone: [phone] Mobile Phone: [phone] Relation: Daughter Secondary Emergency Contact: Ritta Slot [place] of [place] Mobile Phone: [phone] Relation: Daughter  Code Status:  DNR Goals of care: Advanced Directive information Advanced Directives 11/06/2018  Does Patient Have a Medical Advance Directive? Yes  Type of Advance Directive [place]  Does patient want to make changes to medical advance directive? -  Copy of [place] in Chart? -  Would patient like information on creating a medical advance directive? -  Pre-existing out of facility DNR order (yellow form or pink MOST form) -     Chief Complaint  Patient presents with  . Acute Visit    Diarrhea and generalized     HPI:  Pt is a 85 y.o. female seen today for an acute visit for diarrhea on and off x 2 weeks, worsened in the past 24 hours 3x total. She stated her appetite has been poor, feel weak,  nauseated, no vomiting. She denied fever, increased cough or SOB, chest pain/palpitation, abd pain, dysuria, or hematuria. Hx of GERD/nausea, managed with Omeprazole $RemoveBeforeD'20mg'JhgIHDVsleElCw$  qd, prn Zofran $RemoveBe'4mg'XgslgVBwh$  q6h. Hx of depression, on Sertraline $RemoveBefor'75mg'liUsnfaHgtOd$  qd. CHF/peripheral edema, on Metolazone 2.$RemoveBeforeD'5mg'dSuJfEuTWtbpNC$  qd, no weight changes.   Chronic DOE and edema BLE remains no change. Hypothyroidism, on Levothyroxine 137mcg qd, last TSH 20.08 due to her noncompliant of meds at home.    Past Medical History:  Diagnosis Date  . ARTHRITIS, KNEES, BILATERAL   . Breast CA ([place]) 2000   s/p R lumpectomy and XRT  . CHF (congestive heart failure) ([place])   . DEPRESSION   . DJD (degenerative joint disease) of knee   . GERD   . HYPERLIPIDEMIA   . HYPERTENSION   . HYPOTHYROIDISM    postsurgical  . Hypoxia   . MIGRAINE HEADACHE   . Morbid obesity ([place])   . OSA (obstructive sleep apnea) 01/17/2011 dx  . Oxygen dependent   . Urge incontinence   . UTI (lower urinary tract infection) 12/2014   Past Surgical History:  Procedure Laterality Date  . ABDOMINAL HYSTERECTOMY    . ADENOIDECTOMY    . BREAST BIOPSY  2000  . BREAST LUMPECTOMY  2000  . CATARACT EXTRACTION    . CHOLECYSTECTOMY  1995  . CYSTOSCOPY/URETEROSCOPY/HOLMIUM LASER Right 06/28/2015   Procedure: CYSTOSCOPY RIGHT RETROGRAD RIGHT URETEROSCOPY/HOLMIUM LASER WITH RIGHT STENT PLACEMENT;  Surgeon: Alexis Frock, MD;  Location: [facility];  Service: Urology;  Laterality: Right;  . PARATHYROIDECTOMY     2-3 removed  . REPLACEMENT TOTAL KNEE BILATERAL  1999, 2005  . Right leg femur repaired    . THYROIDECTOMY    . TONSILLECTOMY    . TUBAL LIGATION      Allergies  Allergen Reactions  . Azithromycin Itching  . Beta Adrenergic Blockers Other (See Comments)    Depression   .  Ciprofloxacin Other (See Comments)    Edgy and very jumpy    . Codeine Nausea And Vomiting  . Darvon [Propoxyphene] Nausea And Vomiting  . Epinephrine Other (See Comments)    Rapid pulse, sweats  . Klonopin [Clonazepam] Other (See Comments)    Makes her feel very suicidal   . Oxycodone Other (See Comments)    Patient felt like it altered her mental status "Crazy" . Hallucinations later as well.  [name] Paxil [Paroxetine Hydrochloride] Other (See Comments)    Severe depression   . Prednisone Other (See  Comments)    "makes me feel really bad"  . Propoxyphene Hcl Nausea And Vomiting  . Tape Itching  . Torsemide Other (See Comments)    Patient stated that she doesn't recall the reaction  . Tramadol Other (See Comments)    Feels "jittery"  . Vicodin [Hydrocodone-Acetaminophen] Other (See Comments)    Hallucinations  . Meloxicam Diarrhea  . Other Rash    Polyester sheets "cause me a rash"  . Vancomycin Rash    Localized rash related to infusion rate    Outpatient Encounter Medications as of 11/06/2018  Medication Sig  . acetaminophen (TYLENOL) 325 MG tablet Take 650 mg by mouth every 8 (eight) hours as needed for mild pain or moderate pain.   [name] aspirin 81 MG tablet Take 81 mg by mouth daily.   . cetirizine (ZYRTEC) 10 MG chewable tablet Chew 10 mg by mouth daily as needed for allergies.   . fluticasone (FLONASE) 50 MCG/ACT nasal spray Place 2 sprays into both nostrils daily.  [name] levothyroxine (SYNTHROID) 150 MCG tablet Take 150 mcg by mouth daily before breakfast. Patient is to use name brand medication (Synthroid only)  . Melatonin 5 MG TABS Take 5 mg by mouth at bedtime.  . metolazone (ZAROXOLYN) 2.5 MG tablet Take 2.5 mg by mouth daily.  [name] omeprazole (PRILOSEC) 20 MG capsule Take 20 mg by mouth daily.   . ondansetron (ZOFRAN) 4 MG tablet TAKE (1) TABLET BY MOUTH EVERY SIX HOURS AS NEEDED FOR NAUSEA.  [name] potassium chloride SA (K-DUR) 20 MEQ tablet Take 40 mEq by mouth 2 (two) times daily.   [name] saccharomyces boulardii (FLORASTOR) 250 MG capsule Take 250 mg by mouth daily.  . sertraline (ZOLOFT) 50 MG tablet Take 1.5 tablets (75 mg total) by mouth daily.  . vitamin B-12 (CYANOCOBALAMIN) 1000 MCG tablet Take 2,000 mcg by mouth daily. 2 tabs   No facility-administered encounter medications on file as of 11/06/2018.    ROS was provided with assistance of staff.  Review of Systems  Constitutional: Positive for activity change, appetite change and fatigue. Negative for chills, diaphoresis,  fever and unexpected weight change.  HENT: Positive for hearing loss. Negative for congestion and voice change.   Respiratory: Positive for shortness of breath. Negative for cough and wheezing.        DOE  Cardiovascular: Positive for leg swelling. Negative for chest pain and palpitations.  Gastrointestinal: Positive for diarrhea and nausea. Negative for abdominal distention, abdominal pain, blood in stool and vomiting.  Genitourinary: Negative for difficulty urinating, dysuria, hematuria and urgency.  Musculoskeletal: Positive for gait problem.  Skin: Negative for color change.  Neurological: Negative for dizziness, speech difficulty, weakness and headaches.       Memory lapses.   Psychiatric/Behavioral: Negative for agitation, behavioral problems, hallucinations and sleep disturbance. The patient is nervous/anxious.     Immunization History  Administered Date(s) Administered  . Influenza Split 03/20/2011  . Influenza  Whole 03/19/2009, 03/19/2012  . Influenza, High Dose Seasonal PF 03/16/2018  . Influenza,inj,Quad PF,6+ Mos 03/19/2013  . Influenza-Unspecified 03/03/2014, 03/15/2015, 03/30/2016, 04/05/2017  . Meningococcal Polysaccharide 03/15/2015  . PPD Test 03/30/2016  . Pneumococcal Conjugate-13 03/12/2014  . Pneumococcal Polysaccharide-23 06/19/2006, 03/15/2015  . Td 10/04/2009  . Tdap 06/19/2005  . Zoster 06/19/2010  . Zoster Recombinat (Shingrix) 01/28/2018, 04/22/2018, 05/22/2018   Pertinent  Health Maintenance Due  Topic Date Due  . FOOT EXAM  09/08/1940  . OPHTHALMOLOGY EXAM  09/08/1940  . URINE MICROALBUMIN  09/08/1940  . DEXA SCAN  09/07/1997  . HEMOGLOBIN A1C  11/19/2018  . INFLUENZA VACCINE  01/18/2019  . PNA vac Low Risk Adult  Completed   Fall Risk  08/30/2017 06/07/2017 12/22/2016 04/06/2016 11/27/2015  Falls in the past year? No No Yes Yes No  Number falls in past yr: - - 1 1 -  Injury with Fall? - - No Yes -  Risk for fall due to : - - - - -   Functional  Status Survey:    Vitals:   11/06/18 1139  BP: 136/70  Pulse: 83  Resp: 18  Temp: 98.2 F (36.8 C)  TempSrc: Oral  SpO2: 92%  Weight: 294 lb 6.4 oz (133.5 kg)  Height: $Remove'5\' 5"'CnEoPVH$  (1.651 m)   Body mass index is 48.99 kg/m. Physical Exam Constitutional:      General: She is not in acute distress.    Appearance: Normal appearance. She is obese. She is ill-appearing. She is not toxic-appearing or diaphoretic.  HENT:     Head: Normocephalic and atraumatic.     Nose: Nose normal. No congestion or rhinorrhea.     Mouth/Throat:     Mouth: Mucous membranes are dry.  Eyes:     Extraocular Movements: Extraocular movements intact.     Conjunctiva/sclera: Conjunctivae normal.     Pupils: Pupils are equal, round, and reactive to light.  Neck:     Musculoskeletal: Normal range of motion and neck supple.  Cardiovascular:     Rate and Rhythm: Normal rate and regular rhythm.     Heart sounds: No murmur.  Pulmonary:     Effort: Pulmonary effort is normal.     Breath sounds: Rales present. No wheezing or rhonchi.     Comments: Bibasilar rales.  Abdominal:     General: Bowel sounds are normal. There is no distension.     Palpations: Abdomen is soft.     Tenderness: There is no abdominal tenderness. There is no right CVA tenderness, left CVA tenderness, guarding or rebound.  Musculoskeletal:     Right lower leg: Edema present.     Left lower leg: Edema present.     Comments: W/c for mobility.   Skin:    General: Skin is warm and dry.     Coloration: Skin is not jaundiced or pale.     Comments: lipo dermatosclerosis BLE   Neurological:     General: No focal deficit present.     Mental Status: She is alert. Mental status is at baseline.     Cranial Nerves: No cranial nerve deficit.     Motor: No weakness.     Coordination: Coordination normal.     Gait: Gait abnormal.     Comments: Oriented to person and place.   Psychiatric:        Mood and Affect: Mood normal.        Behavior:  Behavior normal.  Thought Content: Thought content normal.        Judgment: Judgment normal.     Comments: F;lat affect     Labs reviewed: Recent Labs    01/01/18 0715 01/10/18 0000  05/20/18 0000 07/05/18 1248 07/05/18 1337 08/09/18 0533 08/10/18 0535 10/30/18  NA 142 141   < > 139 140 140 138 139 143  K 4.0 3.7   < > 3.6 3.2* 3.0* 3.8 4.1 3.9  CL 100 99   < > 100 100 96* 99 100  --   CO2 36* 33*   < > 31 29  --  30 31  --   GLUCOSE 115* 86   < > 114* 117* 114* 133* 103*  --   BUN 17 22   < > 21 19 20 23 10 13   CREATININE 0.86 0.89*   < > 0.92* 0.90 1.00 0.97 0.75 0.9  CALCIUM 8.0* 8.1*   < > 8.1* 8.2*  --  8.1* 7.5*  --   MG 1.8 1.7  --  1.6  --   --   --   --   --    < > = values in this interval not displayed.   Recent Labs    03/15/18 0540 04/23/18 1519 05/20/18 0000 08/10/18 0535 10/30/18  AST 14* 20 16 21 14   ALT 13 12 11 11 8   ALKPHOS 74 84  --  79 78  BILITOT 0.4 0.7 0.5 0.9  --   PROT 6.1* 6.6 6.4 5.7*  --   ALBUMIN 3.0* 3.0*  --  2.8*  --    Recent Labs    05/06/18 0613  07/05/18 1248  08/09/18 0533 08/10/18 0621 10/30/18  WBC 7.3   < > 6.3  --  7.3 5.4 7.3  NEUTROABS 5.4  --  4.7  --  4.6  --   --   HGB 12.5   < > 12.9   < > 12.2 11.5* 12.6  HCT 40.2   < > 41.0   < > 39.1 38.6 39  MCV 94.4   < > 92.3  --  96.3 98.2  --   PLT 123*   < > 142*  --  129* 133* 132*   < > = values in this interval not displayed.   Lab Results  Component Value Date   TSH 20.08 (A) 10/30/2018   Lab Results  Component Value Date   HGBA1C 6.5 (H) 05/20/2018   Lab Results  Component Value Date   CHOL 153 03/05/2018   HDL 45 (L) 03/05/2018   LDLCALC 86 03/05/2018   LDLDIRECT 99.4 03/13/2012   TRIG 127 03/05/2018   CHOLHDL 3.4 03/05/2018    Significant Diagnostic Results in last 30 days:   Assessment/Plan Diarrhea In setting of IBS, the patient declined CBC/diff, CMP, C-diff toxin A/B at [facility], she requested to ED eval.   Generalized weakness The  patient desires ED eval even if she desires no heroic measures in her plan of care.   Acid reflux Occasional nauseated, continue Omeprazole 20mg  qd, Zofran 4mg  q6h prn.   Chronic respiratory failure with hypoxia (HCC) Chronic SOB/DOE, dx of obesity hypoventilation syndrome, on  O2 2lpm via [place]  Depression with anxiety Flat affect noted, continue Sertraline 75mg  qd for now.   Chronic diastolic CHF (congestive heart failure) (HCC) Compensated clinically, continue Metolazone 2.5mg  qd.   Hypothyroidism (acquired) Elevated TSH 20.08 10/29/18 probably due to med non compliant at home, will continue Levothyroxine  129mcg qd, update TSH       Family/ staff Communication: plan of care reviewed with the patient and charge nurse.   Labs/tests ordered:  none  Time spend 40 minutes.

## 2018-11-06 NOTE — ED Triage Notes (Signed)
Pt arrives with [facility] c/o weakness for the past 2 weeks, and nausea and diarrhea. Pt has been living at [facility] in [facility] and states she was in the [employer]" and has progressively been getting worse. Pt was dx'd with CHF ~ 6 months ago. Per EMS, pt's O2 sat on RA is 85-92%. Pt uses 2-3L oxygen at the facility as needed. En route to ED, pt placed on 2L O2, satting 96-99%. Pt a&o x4.

## 2018-11-06 NOTE — Assessment & Plan Note (Signed)
Occasional nauseated, continue Omeprazole 20mg  qd, Zofran 4mg  q6h prn.

## 2018-11-06 NOTE — Assessment & Plan Note (Signed)
The patient desires ED eval even if she desires no heroic measures in her plan of care.

## 2018-11-06 NOTE — ED Provider Notes (Signed)
[facility] EMERGENCY DEPARTMENT Provider Note   CSN: [id number] Arrival date & time: 11/06/18  1236    History   Chief Complaint Chief Complaint  Patient presents with  . Weakness    nausea and diarrhea    HPI Tamara Morales is a 85 y.o. female.     HPI Patient presents with weakness over the last couple weeks.  Report is been in the infirmary there.  Has had nausea and some diarrhea.  No vomiting.  Has had decreased appetite.  History of CHF also.  Has oxygen at home as needed.  Reportedly has some mild hypoxia on room air with sats of 85 to 92%.  Patient states she is just fatigued.  States she has some swelling on her legs but not as bad as it gets at other times.  Feels like she is carrying a little extra fluid however.  States she is just more fatigued today.  No recent antibiotics.  No recent travel. Past Medical History:  Diagnosis Date  . ARTHRITIS, KNEES, BILATERAL   . Breast CA ([place]) 2000   s/p R lumpectomy and XRT  . CHF (congestive heart failure) ([place])   . DEPRESSION   . DJD (degenerative joint disease) of knee   . GERD   . HYPERLIPIDEMIA   . HYPERTENSION   . HYPOTHYROIDISM    postsurgical  . Hypoxia   . MIGRAINE HEADACHE   . Morbid obesity ([place])   . OSA (obstructive sleep apnea) 01/17/2011 dx  . Oxygen dependent   . Urge incontinence   . UTI (lower urinary tract infection) 12/2014    Patient Active Problem List   Diagnosis Date Noted  . Generalized weakness 11/06/2018  . Chronic diastolic CHF (congestive heart failure) ([place]) 10/29/2018  . Vitamin B12 deficiency 05/23/2018  . Anxiety 04/23/2018  . Chronic respiratory failure with hypoxia ([place]) 04/23/2018  . Acute on chronic diastolic (congestive) heart failure ([place]) 03/14/2018  . Diabetes mellitus type 2 in obese ([place]) 03/12/2018  . Lymphedema 11/20/2017  . Counseling regarding advanced care planning and goals of care 09/20/2017  . Hypomagnesemia 07/17/2017  . Prediabetes  07/03/2017  . Hyperlipidemia LDL goal <70 07/03/2017  . Post-nasal drip 06/27/2017  . CKD (chronic kidney disease) stage 3, GFR 30-59 ml/min (HCC) 05/22/2017  . Bilateral dry eyes 04/20/2017  . Hemorrhoids 04/09/2017  . Chronic constipation 03/30/2017  . Weight gain 12/07/2016  . Neck pain on left side 10/05/2016  . Chronic left shoulder pain 08/10/2016  . Hematuria 08/10/2016  . Numbness and tingling of right leg 04/06/2016  . IBS (irritable bowel syndrome) 11/18/2015  . Hypokalemia 07/07/2015  . Acid reflux 07/05/2015  . UTI (urinary tract infection) 07/02/2015  . Ureterolithiasis   . Depression with anxiety 06/27/2015  . History of breast cancer 06/27/2015  . Obesity hypoventilation syndrome ([place]) 11/27/2014  . Diarrhea 08/21/2013  . Dyspnea 03/13/2013  . Cough 03/05/2010  . Morbid obesity ([place]) 10/04/2009  . Hypothyroidism (acquired) 08/05/2009  . Essential hypertension 08/05/2009    Past Surgical History:  Procedure Laterality Date  . ABDOMINAL HYSTERECTOMY    . ADENOIDECTOMY    . BREAST BIOPSY  2000  . BREAST LUMPECTOMY  2000  . CATARACT EXTRACTION    . CHOLECYSTECTOMY  1995  . CYSTOSCOPY/URETEROSCOPY/HOLMIUM LASER Right 06/28/2015   Procedure: CYSTOSCOPY RIGHT RETROGRAD RIGHT URETEROSCOPY/HOLMIUM LASER WITH RIGHT STENT PLACEMENT;  Surgeon: Alexis Frock, MD;  Location: [facility];  Service: Urology;  Laterality: Right;  . PARATHYROIDECTOMY  2-3 removed  . REPLACEMENT TOTAL KNEE BILATERAL  1999, 2005  . Right leg femur repaired    . THYROIDECTOMY    . TONSILLECTOMY    . TUBAL LIGATION       OB History    Gravida  5   Para  5   Term      Preterm      AB      Living        SAB      TAB      Ectopic      Multiple      Live Births               Home Medications    Prior to Admission medications   Medication Sig Start Date End Date Taking? Authorizing Provider  acetaminophen (TYLENOL) 325 MG tablet Take 650 mg by mouth every 8 (eight)  hours as needed for mild pain or moderate pain.    Yes [provider]  aspirin 81 MG tablet Take 81 mg by mouth daily.    Yes [provider]  cetirizine (ZYRTEC) 10 MG chewable tablet Chew 10 mg by mouth daily as needed for allergies.    Yes [provider]  fluticasone (FLONASE) 50 MCG/ACT nasal spray Place 2 sprays into both nostrils daily.   Yes [provider]  levothyroxine (SYNTHROID) 150 MCG tablet Take 150 mcg by mouth daily before breakfast. Patient is to use name brand medication (Synthroid only)   Yes [provider]  Melatonin 5 MG TABS Take 5 mg by mouth at bedtime.   Yes [provider]  metolazone (ZAROXOLYN) 2.5 MG tablet Take 2.5 mg by mouth daily.   Yes [provider]  omeprazole (PRILOSEC) 20 MG capsule Take 20 mg by mouth daily.    Yes [provider]  ondansetron (ZOFRAN) 4 MG tablet TAKE (1) TABLET BY MOUTH EVERY SIX HOURS AS NEEDED FOR NAUSEA. 08/22/18  Yes Mast, Man X, NP  potassium chloride SA (K-DUR) 20 MEQ tablet Take 40 mEq by mouth 2 (two) times daily.    Yes [provider]  saccharomyces boulardii (FLORASTOR) 250 MG capsule Take 250 mg by mouth daily.   Yes [provider]  sertraline (ZOLOFT) 50 MG tablet Take 1.5 tablets (75 mg total) by mouth daily. 05/23/18 05/23/19 Yes Mast, Man X, NP  vitamin B-12 (CYANOCOBALAMIN) 1000 MCG tablet Take 2,000 mcg by mouth daily. 2 tabs   Yes [provider]    Family History Family History  Problem Relation Age of Onset  . Emphysema Sister   . Coronary artery disease Neg Hx     Social History Social History   Tobacco Use  . Smoking status: Former Smoker    Packs/day: 0.25    Years: 2.00    Pack years: 0.50    Types: Cigarettes    Last attempt to quit: 06/19/1974    Years since quitting: 44.4  . Smokeless tobacco: Never Used  . Tobacco comment: Quit in late 20's  Substance Use Topics  . Alcohol use: No    Alcohol/week:  0.0 standard drinks    Comment: rare  . Drug use: No     Allergies   Azithromycin; Beta adrenergic blockers; Ciprofloxacin; Codeine; Darvon [propoxyphene]; Epinephrine; Klonopin [clonazepam]; Oxycodone; Paxil [paroxetine hydrochloride]; Prednisone; Propoxyphene hcl; Tape; Torsemide; Tramadol; Vicodin [hydrocodone-acetaminophen]; Meloxicam; Other; and Vancomycin   Review of Systems Review of Systems  Constitutional: Positive for appetite change and fatigue. Negative for  fever.  HENT: Negative for congestion.   Respiratory: Positive for shortness of breath. Negative for wheezing.   Cardiovascular: Positive for leg swelling. Negative for chest pain.  Gastrointestinal: Positive for diarrhea and nausea.  Genitourinary: Negative for flank pain.  Neurological: Negative for weakness.  Psychiatric/Behavioral: Negative for confusion.     Physical Exam Updated Vital Signs BP (!) 102/55 (BP Location: Left Arm)   Pulse 70   Temp 98.2 F (36.8 C) (Oral)   Resp 18   Ht $R'5\' 5"'Fo$  (1.651 m)   Wt 133.4 kg   SpO2 99%   BMI 48.92 kg/m   Physical Exam Vitals signs and nursing note reviewed.  HENT:     Head: Normocephalic.     Nose: Nose normal.  Cardiovascular:     Rate and Rhythm: Regular rhythm.  Pulmonary:     Breath sounds: No wheezing or rhonchi.     Comments: Harsh breath sounds without focal rales or rhonchi. Abdominal:     Tenderness: There is no abdominal tenderness.  Musculoskeletal:     Right lower leg: Edema present.     Left lower leg: Edema present.     Comments: Pitting edema bilateral lower extremities.  Chronic skin changes.  Skin:    Capillary Refill: Capillary refill takes less than 2 seconds.  Neurological:     Mental Status: Mental status is at baseline.      ED Treatments / Results  Labs (all labs ordered are listed, but only abnormal results are displayed) Labs Reviewed  CBC WITH DIFFERENTIAL/PLATELET - Abnormal; Notable for the following components:       Result Value   Hemoglobin 11.7 (*)    RDW 15.6 (*)    Platelets 140 (*)    All other components within normal limits  GASTROINTESTINAL PANEL BY PCR, STOOL (REPLACES STOOL CULTURE)  C DIFFICILE QUICK SCREEN W PCR REFLEX  COMPREHENSIVE METABOLIC PANEL  BRAIN NATRIURETIC PEPTIDE  TROPONIN I  URINALYSIS, ROUTINE W REFLEX MICROSCOPIC    EKG EKG Interpretation  Date/Time:  Wednesday Nov 06 2018 13:48:11 EDT Ventricular Rate:  69 PR Interval:    QRS Duration: 93 QT Interval:  410 QTC Calculation: 440 R Axis:   50 Text Interpretation:  Sinus rhythm Probable left atrial enlargement Confirmed by Davonna Belling [phone]) on 11/06/2018 3:11:59 PM   Radiology Dg Chest Portable 1 View  Result Date: 11/06/2018 CLINICAL DATA:  Shortness of breath and cough for 1 month. EXAM: PORTABLE CHEST 1 VIEW COMPARISON:  Chest x-ray August 09, 2018 FINDINGS: The heart size and mediastinal contours are stable. The heart size is enlarged. Both lungs are clear. The visualized skeletal structures are unremarkable. IMPRESSION: No active cardiopulmonary disease. Electronically Signed   By: Abelardo Diesel M.D.   On: 11/06/2018 13:42    Procedures Procedures (including critical care time)  Medications Ordered in ED Medications - No data to display   Initial Impression / Assessment and Plan / ED Course  I have reviewed the triage vital signs and the nursing notes.  Pertinent labs & imaging results that were available during my care of the patient were reviewed by me and considered in my medical decision making (see chart for details).        Patient with fatigue after 2 weeks of diarrhea.  No recent antibiotics.  Has had decreased oral intake but also states she feels that she is carrying extra fluid with her CHF.  X-ray reassuring.  Labs however pending.  Care will be turned over  to Dr. Verner Chol.  Final Clinical Impressions(s) / ED Diagnoses   Final diagnoses:  Diarrhea, unspecified type   Fatigue, unspecified type    ED Discharge Orders    None       Davonna Belling, MD 11/06/18 1512

## 2018-11-06 NOTE — Assessment & Plan Note (Signed)
Compensated clinically, continue Metolazone 2.5mg  qd.

## 2018-11-06 NOTE — Assessment & Plan Note (Signed)
Flat affect noted, continue Sertraline 75mg  qd for now.

## 2018-11-06 NOTE — Assessment & Plan Note (Signed)
Elevated TSH 20.08 10/29/18 probably due to med non compliant at home, will continue Levothyroxine 166mcg qd, update TSH

## 2018-11-06 NOTE — Assessment & Plan Note (Addendum)
Chronic SOB/DOE, dx of obesity hypoventilation syndrome, on  O2 2lpm via [place]

## 2018-11-08 ENCOUNTER — Encounter: Payer: Self-pay | Admitting: Internal Medicine

## 2018-11-08 ENCOUNTER — Non-Acute Institutional Stay: Payer: Medicare Other | Admitting: Internal Medicine

## 2018-11-08 DIAGNOSIS — R410 Disorientation, unspecified: Secondary | ICD-10-CM

## 2018-11-08 DIAGNOSIS — R197 Diarrhea, unspecified: Secondary | ICD-10-CM

## 2018-11-08 DIAGNOSIS — I10 Essential (primary) hypertension: Secondary | ICD-10-CM | POA: Diagnosis not present

## 2018-11-08 DIAGNOSIS — F419 Anxiety disorder, unspecified: Secondary | ICD-10-CM | POA: Diagnosis not present

## 2018-11-08 DIAGNOSIS — E662 Morbid (severe) obesity with alveolar hypoventilation: Secondary | ICD-10-CM | POA: Diagnosis not present

## 2018-11-08 DIAGNOSIS — I5032 Chronic diastolic (congestive) heart failure: Secondary | ICD-10-CM

## 2018-11-08 DIAGNOSIS — N183 Chronic kidney disease, stage 3 unspecified: Secondary | ICD-10-CM

## 2018-11-08 DIAGNOSIS — E538 Deficiency of other specified B group vitamins: Secondary | ICD-10-CM

## 2018-11-08 DIAGNOSIS — E039 Hypothyroidism, unspecified: Secondary | ICD-10-CM

## 2018-11-08 NOTE — Progress Notes (Signed)
Location:  [facility] Room Number: 907/A Place of Service:  ALF [phone]) Provider:Gupta Anjali L,MD   Virgie Dad, MD  Patient Care Team: Virgie Dad, MD as PCP - General (Internal Medicine) Minus Breeding, MD as PCP - Cardiology (Cardiology) Azucena Fallen, MD as Consulting Physician (Obstetrics and Gynecology) Chesley Mires, MD as Consulting Physician (Pulmonary Disease) Mast, Man X, NP as Nurse Practitioner (Internal Medicine) Christin Fudge, MD as Consulting Physician (Surgery)  Extended Emergency Contact Information Primary Emergency Contact: Boxman,Lee Address: [address]          [place], [place] [address] Johnnette Litter of [place] Phone: [phone] Mobile Phone: [phone] Relation: Daughter Secondary Emergency Contact: Gergle,Chris  [place] Mobile Phone: [phone] Relation: Daughter  Code Status:Full  Code  Goals of care: Advanced Directive information Advanced Directives 11/08/2018  Does Patient Have a Medical Advance Directive? Yes  Type of Paramedic of [place];Living will;Out of facility DNR (pink MOST or yellow form)  Does patient want to make changes to medical advance directive? No - Patient declined  Copy of [place] in Chart? Yes - validated most recent copy scanned in chart (See row information)  Would patient like information on creating a medical advance directive? -  Pre-existing out of facility DNR order (yellow form or pink MOST form) Pink MOST form placed in chart (order not valid for inpatient use);Yellow form placed in chart (order not valid for inpatient use)     Chief Complaint  Patient presents with  . Acute Visit    Follow up   . Health Maintenance    foot exam, eye exam., dexa scan,urine microalbumin    HPI:  Pt is a 85 y.o. female seen today for an acute visit for Confusion And diarrhea  Patient has h/o Hypertension, Chronic LE edema  with Diastolic CHF and Lymphedema, Anxiety, B12 def,OSA but does not use CPAP only Oxygen , Diabetes Type2 She lives in [place] and gets around in [employer]. Is independnet in her ADLS. And transfers. Her husband passed away in 12-Feb-2023 of Last year. So she is by herself in apartment She was transferred to Assisted living as she was getting very nervous in her apartment by herself.  Was having hallucinations, SOB and getting confused of her Meds Her work up has been negative. [place] Negative. Chest Xray with no acute issues Her TSH was 20. But we kept the same dose of Synthroid as we think she was not taking her Meds properly. Few days ago she was sent to the ED her request.  She was having some nausea and loose stools.  Her work-up in ED including a chest x-ray UA blood work was all completely normal.  She was sent back after getting some IV fluids This morning she woke up confused.  And called 911 as she did not know where she was. Staff had to remind her. She was very upset today she said that the staff has not been nice to her.  She has also been told by the staff that she has to probably stay in AL around able to go back to her apartment.  At this time she says she wants to eventually go back after end of this week. She continues to complain of loose stools 3-4 times a day and weakness no nausea or abdominal pain or cramps.  No fever chills chest pain or cough      Past Medical History:  Diagnosis  Date  . ARTHRITIS, KNEES, BILATERAL   . Breast CA ([place]) 2000   s/p R lumpectomy and XRT  . CHF (congestive heart failure) ([facility])   . DEPRESSION   . DJD (degenerative joint disease) of knee   . GERD   . HYPERLIPIDEMIA   . HYPERTENSION   . HYPOTHYROIDISM    postsurgical  . Hypoxia   . MIGRAINE HEADACHE   . Morbid obesity ([facility])   . OSA (obstructive sleep apnea) 01/17/2011 dx  . Oxygen dependent   . Urge incontinence   . UTI (lower urinary tract infection) 12/2014   Past Surgical History:   Procedure Laterality Date  . ABDOMINAL HYSTERECTOMY    . ADENOIDECTOMY    . BREAST BIOPSY  2000  . BREAST LUMPECTOMY  2000  . CATARACT EXTRACTION    . CHOLECYSTECTOMY  1995  . CYSTOSCOPY/URETEROSCOPY/HOLMIUM LASER Right 06/28/2015   Procedure: CYSTOSCOPY RIGHT RETROGRAD RIGHT URETEROSCOPY/HOLMIUM LASER WITH RIGHT STENT PLACEMENT;  Surgeon: Alexis Frock, MD;  Location: [facility];  Service: Urology;  Laterality: Right;  . PARATHYROIDECTOMY     2-3 removed  . REPLACEMENT TOTAL KNEE BILATERAL  1999, 2005  . Right leg femur repaired    . THYROIDECTOMY    . TONSILLECTOMY    . TUBAL LIGATION      Allergies  Allergen Reactions  . Azithromycin Itching  . Beta Adrenergic Blockers Other (See Comments)    Depression   . Ciprofloxacin Other (See Comments)    Edgy and very jumpy    . Codeine Nausea And Vomiting  . Darvon [Propoxyphene] Nausea And Vomiting  . Epinephrine Other (See Comments)    Rapid pulse, sweats  . Klonopin [Clonazepam] Other (See Comments)    Makes her feel very suicidal   . Oxycodone Other (See Comments)    Patient felt like it altered her mental status "Crazy" . Hallucinations later as well.  [name] Paxil [Paroxetine Hydrochloride] Other (See Comments)    Severe depression   . Prednisone Other (See Comments)    "makes me feel really bad"  . Propoxyphene Hcl Nausea And Vomiting  . Tape Itching  . Torsemide Other (See Comments)    Patient stated that she doesn't recall the reaction  . Tramadol Other (See Comments)    Feels "jittery"  . Vicodin [Hydrocodone-Acetaminophen] Other (See Comments)    Hallucinations  . Meloxicam Diarrhea  . Other Rash    Polyester sheets "cause me a rash"  . Vancomycin Rash    Localized rash related to infusion rate    Outpatient Encounter Medications as of 11/08/2018  Medication Sig  . acetaminophen (TYLENOL) 325 MG tablet Take 650 mg by mouth every 8 (eight) hours as needed for mild pain or moderate pain.   [name] aspirin 81 MG tablet Take  81 mg by mouth daily.   . cetirizine (ZYRTEC) 10 MG chewable tablet Chew 10 mg by mouth daily as needed for allergies.   . fluticasone (FLONASE) 50 MCG/ACT nasal spray Place 2 sprays into both nostrils daily.  [name] levothyroxine (SYNTHROID) 150 MCG tablet Take 150 mcg by mouth daily before breakfast. Patient is to use name brand medication (Synthroid only)  . loperamide (IMODIUM A-D) 2 MG tablet Take 4 mg by mouth as needed for diarrhea or loose stools. $RemoveBefor'4mg'MuuaCMgdQHnQ$  1st dose, then $RemoveBe'2mg'TlWRfoInS$  after each loose stool x48 hrs  . Melatonin 5 MG TABS Take 5 mg by mouth at bedtime.  . metolazone (ZAROXOLYN) 2.5 MG tablet Take 2.5 mg by mouth daily.  [name]  omeprazole (PRILOSEC) 20 MG capsule Take 20 mg by mouth 2 (two) times a day.   . ondansetron (ZOFRAN) 4 MG tablet TAKE (1) TABLET BY MOUTH EVERY SIX HOURS AS NEEDED FOR NAUSEA.  [name] potassium chloride SA (K-DUR) 20 MEQ tablet Take 60 mEq by mouth 2 (two) times daily.   [name] saccharomyces boulardii (FLORASTOR) 250 MG capsule Take 250 mg by mouth daily.  . sertraline (ZOLOFT) 50 MG tablet Take 1.5 tablets (75 mg total) by mouth daily.  . vitamin B-12 (CYANOCOBALAMIN) 1000 MCG tablet Take 2,000 mcg by mouth daily. 2 tabs   No facility-administered encounter medications on file as of 11/08/2018.     Review of Systems  Constitutional: Positive for activity change and appetite change.  HENT: Negative.   Respiratory: Positive for shortness of breath.   Cardiovascular: Positive for leg swelling.  Gastrointestinal: Positive for diarrhea. Negative for abdominal pain and nausea.  Genitourinary: Negative.   Musculoskeletal: Positive for gait problem.  Neurological: Positive for weakness.  Psychiatric/Behavioral: Positive for dysphoric mood, hallucinations and sleep disturbance. The patient is nervous/anxious.     Immunization History  Administered Date(s) Administered  . Influenza Split 03/20/2011  . Influenza Whole 03/19/2009, 03/19/2012  . Influenza, High Dose Seasonal PF  03/16/2018  . Influenza,inj,Quad PF,6+ Mos 03/19/2013  . Influenza-Unspecified 03/03/2014, 03/15/2015, 03/30/2016, 04/05/2017  . Meningococcal Polysaccharide 03/15/2015  . PPD Test 03/30/2016  . Pneumococcal Conjugate-13 03/12/2014  . Pneumococcal Polysaccharide-23 06/19/2006, 03/15/2015  . Td 10/04/2009  . Tdap 06/19/2005  . Zoster 06/19/2010  . Zoster Recombinat (Shingrix) 01/28/2018, 04/22/2018, 05/22/2018   Pertinent  Health Maintenance Due  Topic Date Due  . FOOT EXAM  09/08/1940  . OPHTHALMOLOGY EXAM  09/08/1940  . URINE MICROALBUMIN  09/08/1940  . DEXA SCAN  09/07/1997  . HEMOGLOBIN A1C  11/19/2018  . INFLUENZA VACCINE  01/18/2019  . PNA vac Low Risk Adult  Completed   Fall Risk  08/30/2017 06/07/2017 12/22/2016 04/06/2016 11/27/2015  Falls in the past year? No No Yes Yes No  Number falls in past yr: - - 1 1 -  Injury with Fall? - - No Yes -  Risk for fall due to : - - - - -   Functional Status Survey:    Vitals:   11/08/18 1000  BP: 136/70  Pulse: 83  Resp: 18  Temp: 98.3 F (36.8 C)  SpO2: 92%  Weight: 294 lb 9.6 oz (133.6 kg)  Height: $Remove'5\' 5"'dxygOkW$  (1.651 m)   Body mass index is 49.02 kg/m. Physical Exam Vitals signs reviewed.  Constitutional:      Appearance: She is obese.  HENT:     Head: Normocephalic.     Nose: Nose normal.     Mouth/Throat:     Mouth: Mucous membranes are moist.     Pharynx: Oropharynx is clear.  Eyes:     Pupils: Pupils are equal, round, and reactive to light.  Neck:     Musculoskeletal: Neck supple.  Cardiovascular:     Rate and Rhythm: Normal rate and regular rhythm.     Pulses: Normal pulses.     Heart sounds: Normal heart sounds.  Pulmonary:     Effort: Pulmonary effort is normal. No respiratory distress.     Breath sounds: Normal breath sounds. No wheezing.  Abdominal:     General: Abdomen is flat. Bowel sounds are normal. There is no distension.     Palpations: Abdomen is soft.     Tenderness: There is no abdominal  tenderness.  Musculoskeletal:     Comments: Has chronic Venous Changes Bilateral with Mild to Mod edema   Skin:    General: Skin is warm and dry.  Neurological:     General: No focal deficit present.     Mental Status: She is alert and oriented to person, place, and time.  Psychiatric:        Mood and Affect: Mood normal.        Thought Content: Thought content normal.        Judgment: Judgment normal.     Labs reviewed: Recent Labs    01/01/18 0715 01/10/18 0000  05/20/18 0000  08/09/18 0533 08/10/18 0535 10/30/18 11/06/18 1318  NA 142 141   < > 139   < > 138 139 143 140  K 4.0 3.7   < > 3.6   < > 3.8 4.1 3.9 3.9  CL 100 99   < > 100   < > 99 100  --  100  CO2 36* 33*   < > 31   < > 30 31  --  32  GLUCOSE 115* 86   < > 114*   < > 133* 103*  --  95  BUN 17 22   < > 21   < > $R'23 10 13 'Ci$ 7*  CREATININE 0.86 0.89*   < > 0.92*   < > 0.97 0.75 0.9 0.99  CALCIUM 8.0* 8.1*   < > 8.1*   < > 8.1* 7.5*  --  7.9*  MG 1.8 1.7  --  1.6  --   --   --   --   --    < > = values in this interval not displayed.   Recent Labs    04/23/18 1519 05/20/18 0000 08/10/18 0535 10/30/18 11/06/18 1318  AST $Re'20 16 21 14 'Xhv$ 13*  ALT $Re'12 11 11 8 10  'yAW$ ALKPHOS 84  --  79 78 69  BILITOT 0.7 0.5 0.9  --  0.6  PROT 6.6 6.4 5.7*  --  5.7*  ALBUMIN 3.0*  --  2.8*  --  2.7*   Recent Labs    07/05/18 1248  08/09/18 0533 08/10/18 0621 10/30/18 11/06/18 1318  WBC 6.3  --  7.3 5.4 7.3 6.6  NEUTROABS 4.7  --  4.6  --   --  4.7  HGB 12.9   < > 12.2 11.5* 12.6 11.7*  HCT 41.0   < > 39.1 38.6 39 37.7  MCV 92.3  --  96.3 98.2  --  92.2  PLT 142*  --  129* 133* 132* 140*   < > = values in this interval not displayed.   Lab Results  Component Value Date   TSH 20.08 (A) 10/30/2018   Lab Results  Component Value Date   HGBA1C 6.5 (H) 05/20/2018   Lab Results  Component Value Date   CHOL 153 03/05/2018   HDL 45 (L) 03/05/2018   LDLCALC 86 03/05/2018   LDLDIRECT 99.4 03/13/2012   TRIG 127 03/05/2018    CHOLHDL 3.4 03/05/2018    Significant Diagnostic Results in last 30 days:  Dg Chest Portable 1 View  Result Date: 11/06/2018 CLINICAL DATA:  Shortness of breath and cough for 1 month. EXAM: PORTABLE CHEST 1 VIEW COMPARISON:  Chest x-ray August 09, 2018 FINDINGS: The heart size and mediastinal contours are stable. The heart size is enlarged. Both lungs are clear. The visualized skeletal structures are unremarkable. IMPRESSION: No active cardiopulmonary disease. Electronically  Signed   By: Abelardo Diesel M.D.   On: 11/06/2018 13:42    Assessment/Plan Diarrhea, unspecified type Check Stools for GI pathogen and C Diff She has h/o IBS  Confusion She remembers being confused this morning  Her Work up including UA and chest Xray is negative so far Will continue to monitor Right now she is back to her baseline  Chronic diastolic CHF ( Not on Lasix right now Only on Metolazone Will continue to follow Last Echo was in 09/19 EF of 65% Has h/o Hypokalemia Will benefit from Aldactone  Obesity hypoventilation syndrome (HCC) Continue to be on Oxygen  Wears it through out the day now Does not use CPAP Hypothyroidism (acquired) TSh was high her dose was not changed as we think she was non complaint with her meds Repeat TSH pending  CKD (chronic kidney disease) stage 3, GFR 30-59 ml/min (HCC) Creat stable  Vitamin B12 deficiency Was WNL in 01/20  Anxiety with Depression  D/W patient  Will start her on Remeron 7.5 mg QHS Continue Zoloft     Family/ staff Communication:   Labs/tests ordered:   Total time spent in this patient care encounter was  45_  minutes; greater than 50% of the visit spent counseling patient and staff, reviewing records , Labs and coordinating care for problems addressed at this encounter.

## 2018-11-10 DIAGNOSIS — R410 Disorientation, unspecified: Secondary | ICD-10-CM | POA: Insufficient documentation

## 2018-11-11 DIAGNOSIS — F411 Generalized anxiety disorder: Secondary | ICD-10-CM | POA: Diagnosis not present

## 2018-11-13 DIAGNOSIS — N1330 Unspecified hydronephrosis: Secondary | ICD-10-CM | POA: Diagnosis not present

## 2018-11-13 DIAGNOSIS — M6281 Muscle weakness (generalized): Secondary | ICD-10-CM | POA: Diagnosis not present

## 2018-11-13 DIAGNOSIS — N179 Acute kidney failure, unspecified: Secondary | ICD-10-CM | POA: Diagnosis not present

## 2018-11-13 DIAGNOSIS — N201 Calculus of ureter: Secondary | ICD-10-CM | POA: Diagnosis not present

## 2018-11-13 DIAGNOSIS — M25559 Pain in unspecified hip: Secondary | ICD-10-CM | POA: Diagnosis not present

## 2018-11-13 DIAGNOSIS — R2689 Other abnormalities of gait and mobility: Secondary | ICD-10-CM | POA: Diagnosis not present

## 2018-11-13 DIAGNOSIS — N132 Hydronephrosis with renal and ureteral calculous obstruction: Secondary | ICD-10-CM | POA: Diagnosis not present

## 2018-11-13 DIAGNOSIS — N184 Chronic kidney disease, stage 4 (severe): Secondary | ICD-10-CM | POA: Diagnosis not present

## 2018-11-13 DIAGNOSIS — R29898 Other symptoms and signs involving the musculoskeletal system: Secondary | ICD-10-CM | POA: Diagnosis not present

## 2018-11-13 DIAGNOSIS — R4189 Other symptoms and signs involving cognitive functions and awareness: Secondary | ICD-10-CM | POA: Diagnosis not present

## 2018-11-13 DIAGNOSIS — R635 Abnormal weight gain: Secondary | ICD-10-CM | POA: Diagnosis not present

## 2018-11-13 DIAGNOSIS — F329 Major depressive disorder, single episode, unspecified: Secondary | ICD-10-CM | POA: Diagnosis not present

## 2018-11-13 DIAGNOSIS — E039 Hypothyroidism, unspecified: Secondary | ICD-10-CM | POA: Diagnosis not present

## 2018-11-13 DIAGNOSIS — N390 Urinary tract infection, site not specified: Secondary | ICD-10-CM | POA: Diagnosis not present

## 2018-11-13 DIAGNOSIS — F419 Anxiety disorder, unspecified: Secondary | ICD-10-CM | POA: Diagnosis not present

## 2018-11-13 DIAGNOSIS — I10 Essential (primary) hypertension: Secondary | ICD-10-CM | POA: Diagnosis not present

## 2018-11-13 DIAGNOSIS — E662 Morbid (severe) obesity with alveolar hypoventilation: Secondary | ICD-10-CM | POA: Diagnosis not present

## 2018-11-13 DIAGNOSIS — M129 Arthropathy, unspecified: Secondary | ICD-10-CM | POA: Diagnosis not present

## 2018-11-14 ENCOUNTER — Encounter: Payer: Self-pay | Admitting: Nurse Practitioner

## 2018-11-14 ENCOUNTER — Non-Acute Institutional Stay: Payer: Medicare Other | Admitting: Nurse Practitioner

## 2018-11-14 ENCOUNTER — Emergency Department (HOSPITAL_COMMUNITY): Payer: Medicare Other

## 2018-11-14 ENCOUNTER — Emergency Department (HOSPITAL_COMMUNITY)
Admission: EM | Admit: 2018-11-14 | Discharge: 2018-11-14 | Disposition: A | Discharge status: Home / Self Care | Payer: Medicare Other | Attending: Emergency Medicine | Admitting: Emergency Medicine

## 2018-11-14 ENCOUNTER — Other Ambulatory Visit: Payer: Self-pay

## 2018-11-14 DIAGNOSIS — Z7982 Long term (current) use of aspirin: Secondary | ICD-10-CM | POA: Diagnosis not present

## 2018-11-14 DIAGNOSIS — Y999 Unspecified external cause status: Secondary | ICD-10-CM | POA: Diagnosis not present

## 2018-11-14 DIAGNOSIS — E1122 Type 2 diabetes mellitus with diabetic chronic kidney disease: Secondary | ICD-10-CM | POA: Insufficient documentation

## 2018-11-14 DIAGNOSIS — R58 Hemorrhage, not elsewhere classified: Secondary | ICD-10-CM | POA: Diagnosis not present

## 2018-11-14 DIAGNOSIS — Z87891 Personal history of nicotine dependence: Secondary | ICD-10-CM | POA: Insufficient documentation

## 2018-11-14 DIAGNOSIS — E662 Morbid (severe) obesity with alveolar hypoventilation: Secondary | ICD-10-CM | POA: Diagnosis not present

## 2018-11-14 DIAGNOSIS — S161XXA Strain of muscle, fascia and tendon at neck level, initial encounter: Secondary | ICD-10-CM | POA: Diagnosis not present

## 2018-11-14 DIAGNOSIS — S8002XA Contusion of left knee, initial encounter: Secondary | ICD-10-CM | POA: Diagnosis not present

## 2018-11-14 DIAGNOSIS — W010XXA Fall on same level from slipping, tripping and stumbling without subsequent striking against object, initial encounter: Secondary | ICD-10-CM | POA: Insufficient documentation

## 2018-11-14 DIAGNOSIS — K219 Gastro-esophageal reflux disease without esophagitis: Secondary | ICD-10-CM | POA: Diagnosis not present

## 2018-11-14 DIAGNOSIS — S0101XA Laceration without foreign body of scalp, initial encounter: Secondary | ICD-10-CM | POA: Diagnosis not present

## 2018-11-14 DIAGNOSIS — R441 Visual hallucinations: Secondary | ICD-10-CM

## 2018-11-14 DIAGNOSIS — F418 Other specified anxiety disorders: Secondary | ICD-10-CM | POA: Diagnosis not present

## 2018-11-14 DIAGNOSIS — R001 Bradycardia, unspecified: Secondary | ICD-10-CM | POA: Diagnosis not present

## 2018-11-14 DIAGNOSIS — S0181XD Laceration without foreign body of other part of head, subsequent encounter: Secondary | ICD-10-CM

## 2018-11-14 DIAGNOSIS — Z96653 Presence of artificial knee joint, bilateral: Secondary | ICD-10-CM | POA: Insufficient documentation

## 2018-11-14 DIAGNOSIS — R269 Unspecified abnormalities of gait and mobility: Secondary | ICD-10-CM

## 2018-11-14 DIAGNOSIS — S0990XA Unspecified injury of head, initial encounter: Secondary | ICD-10-CM

## 2018-11-14 DIAGNOSIS — E039 Hypothyroidism, unspecified: Secondary | ICD-10-CM

## 2018-11-14 DIAGNOSIS — R4020 Unspecified coma: Secondary | ICD-10-CM | POA: Diagnosis not present

## 2018-11-14 DIAGNOSIS — N390 Urinary tract infection, site not specified: Secondary | ICD-10-CM | POA: Diagnosis not present

## 2018-11-14 DIAGNOSIS — Y92129 Unspecified place in nursing home as the place of occurrence of the external cause: Secondary | ICD-10-CM | POA: Insufficient documentation

## 2018-11-14 DIAGNOSIS — I5032 Chronic diastolic (congestive) heart failure: Secondary | ICD-10-CM | POA: Insufficient documentation

## 2018-11-14 DIAGNOSIS — Z23 Encounter for immunization: Secondary | ICD-10-CM | POA: Insufficient documentation

## 2018-11-14 DIAGNOSIS — N183 Chronic kidney disease, stage 3 (moderate): Secondary | ICD-10-CM | POA: Diagnosis not present

## 2018-11-14 DIAGNOSIS — S8001XA Contusion of right knee, initial encounter: Secondary | ICD-10-CM | POA: Insufficient documentation

## 2018-11-14 DIAGNOSIS — M542 Cervicalgia: Secondary | ICD-10-CM | POA: Diagnosis not present

## 2018-11-14 DIAGNOSIS — S199XXA Unspecified injury of neck, initial encounter: Secondary | ICD-10-CM | POA: Diagnosis not present

## 2018-11-14 DIAGNOSIS — Z79899 Other long term (current) drug therapy: Secondary | ICD-10-CM | POA: Insufficient documentation

## 2018-11-14 DIAGNOSIS — Z743 Need for continuous supervision: Secondary | ICD-10-CM | POA: Diagnosis not present

## 2018-11-14 DIAGNOSIS — I10 Essential (primary) hypertension: Secondary | ICD-10-CM | POA: Diagnosis not present

## 2018-11-14 DIAGNOSIS — I890 Lymphedema, not elsewhere classified: Secondary | ICD-10-CM | POA: Diagnosis not present

## 2018-11-14 DIAGNOSIS — I130 Hypertensive heart and chronic kidney disease with heart failure and stage 1 through stage 4 chronic kidney disease, or unspecified chronic kidney disease: Secondary | ICD-10-CM | POA: Insufficient documentation

## 2018-11-14 DIAGNOSIS — W19XXXA Unspecified fall, initial encounter: Secondary | ICD-10-CM | POA: Diagnosis not present

## 2018-11-14 DIAGNOSIS — S0003XA Contusion of scalp, initial encounter: Secondary | ICD-10-CM | POA: Diagnosis not present

## 2018-11-14 DIAGNOSIS — Y9301 Activity, walking, marching and hiking: Secondary | ICD-10-CM | POA: Insufficient documentation

## 2018-11-14 DIAGNOSIS — M25561 Pain in right knee: Secondary | ICD-10-CM | POA: Diagnosis not present

## 2018-11-14 DIAGNOSIS — M6281 Muscle weakness (generalized): Secondary | ICD-10-CM | POA: Diagnosis not present

## 2018-11-14 DIAGNOSIS — M25562 Pain in left knee: Secondary | ICD-10-CM | POA: Diagnosis not present

## 2018-11-14 DIAGNOSIS — R279 Unspecified lack of coordination: Secondary | ICD-10-CM | POA: Diagnosis not present

## 2018-11-14 DIAGNOSIS — R635 Abnormal weight gain: Secondary | ICD-10-CM | POA: Diagnosis not present

## 2018-11-14 DIAGNOSIS — S0181XA Laceration without foreign body of other part of head, initial encounter: Secondary | ICD-10-CM | POA: Diagnosis not present

## 2018-11-14 MED ORDER — TETANUS-DIPHTH-ACELL PERTUSSIS 5-2.5-18.5 LF-MCG/0.5 IM SUSP
0.5000 mL | Freq: Once | INTRAMUSCULAR | Status: AC
  Administered 2018-11-14: 0.5 mL via INTRAMUSCULAR
  Filled 2018-11-14: qty 0.5

## 2018-11-14 MED ORDER — LIDOCAINE HCL (PF) 1 % IJ SOLN
5.0000 mL | Freq: Once | INTRAMUSCULAR | Status: AC
  Administered 2018-11-14: 5 mL
  Filled 2018-11-14: qty 30

## 2018-11-14 MED ORDER — ACETAMINOPHEN 500 MG PO TABS
1000.0000 mg | ORAL_TABLET | Freq: Once | ORAL | Status: AC
  Administered 2018-11-14: 1000 mg via ORAL
  Filled 2018-11-14: qty 2

## 2018-11-14 NOTE — ED Notes (Signed)
Pt stated that she has been having visual hallucinations for approx 3 days. She has been tested for a UTI and was negative.

## 2018-11-14 NOTE — ED Notes (Signed)
Suture cart and all materials at bedside

## 2018-11-14 NOTE — ED Triage Notes (Signed)
Per EMS - Pt coming from [facility]. Stood up to go to restroom and tripped over clutter in room and fell. Pt stated she laid on the floor for 30-45 min before receiving help. Complains of generalized body pain with increased pain in forehead. Per EMS head lac is approx 2.5 inches. Bleeding controled.   Pt currently has stool samples being tested for c diff   150/100 86 HR  16 R 100% on 2L [place]

## 2018-11-14 NOTE — ED Notes (Signed)
[facility] contacted and paperwork printed

## 2018-11-14 NOTE — ED Notes (Signed)
[facility] at bedside 

## 2018-11-14 NOTE — ED Notes (Signed)
Provider at bedside with suture cart and materials

## 2018-11-14 NOTE — ED Provider Notes (Signed)
[facility] DEPT Provider Note   CSN: [id number] Arrival date & time: 11/14/18  0024    History   Chief Complaint Chief Complaint  Patient presents with   Head Laceration   Fall    HPI Tamara Morales is a 85 y.o. female.     The history is provided by the patient.  Head Laceration  This is a new problem. The current episode started 1 to 2 hours ago. The problem occurs constantly. The problem has been gradually worsening. Associated symptoms include headaches. Pertinent negatives include no chest pain and no abdominal pain. Nothing aggravates the symptoms. Nothing relieves the symptoms.  Fall  This is a new problem. The problem has not changed since onset.Associated symptoms include headaches. Pertinent negatives include no chest pain and no abdominal pain.   Patient presents after fall.  She has a history of depression, CHF, degenerative joint disease and obesity.  She lives in a nursing facility.  She stood up to go the restroom and tripped over items in the floor. She laid there for about 45 minutes.  She reports pain in her head, both knees.  No chest/abdominal pain.  No back pain. She is not on anticoagulants Past Medical History:  Diagnosis Date   ARTHRITIS, KNEES, BILATERAL    Breast CA ([place]) 2000   s/p R lumpectomy and XRT   CHF (congestive heart failure) (HCC)    DEPRESSION    DJD (degenerative joint disease) of knee    GERD    HYPERLIPIDEMIA    HYPERTENSION    HYPOTHYROIDISM    postsurgical   Hypoxia    MIGRAINE HEADACHE    Morbid obesity (HCC)    OSA (obstructive sleep apnea) 01/17/2011 dx   Oxygen dependent    Urge incontinence    UTI (lower urinary tract infection) 12/2014    Patient Active Problem List   Diagnosis Date Noted   Confusion 11/10/2018   Generalized weakness 11/06/2018   Chronic diastolic CHF (congestive heart failure) ([place]) 10/29/2018   Vitamin B12 deficiency 05/23/2018   Anxiety  04/23/2018   Chronic respiratory failure with hypoxia ([place]) 04/23/2018   Acute on chronic diastolic (congestive) heart failure ([place]) 03/14/2018   Diabetes mellitus type 2 in obese ([place]) 03/12/2018   Lymphedema 11/20/2017   Counseling regarding advanced care planning and goals of care 09/20/2017   Hypomagnesemia 07/17/2017   Prediabetes 07/03/2017   Hyperlipidemia LDL goal <70 07/03/2017   Post-nasal drip 06/27/2017   CKD (chronic kidney disease) stage 3, GFR 30-59 ml/min (HCC) 05/22/2017   Bilateral dry eyes 04/20/2017   Hemorrhoids 04/09/2017   Chronic constipation 03/30/2017   Weight gain 12/07/2016   Neck pain on left side 10/05/2016   Chronic left shoulder pain 08/10/2016   Hematuria 08/10/2016   Numbness and tingling of right leg 04/06/2016   IBS (irritable bowel syndrome) 11/18/2015   Hypokalemia 07/07/2015   Acid reflux 07/05/2015   UTI (urinary tract infection) 07/02/2015   Ureterolithiasis    Depression with anxiety 06/27/2015   History of breast cancer 06/27/2015   Obesity hypoventilation syndrome ([place]) 11/27/2014   Diarrhea 08/21/2013   Dyspnea 03/13/2013   Cough 03/05/2010   Morbid obesity ([place]) 10/04/2009   Hypothyroidism (acquired) 08/05/2009   Essential hypertension 08/05/2009    Past Surgical History:  Procedure Laterality Date   ABDOMINAL HYSTERECTOMY     ADENOIDECTOMY     BREAST BIOPSY  2000   BREAST LUMPECTOMY  2000   CATARACT EXTRACTION  CHOLECYSTECTOMY  1995   CYSTOSCOPY/URETEROSCOPY/HOLMIUM LASER Right 06/28/2015   Procedure: CYSTOSCOPY RIGHT RETROGRAD RIGHT URETEROSCOPY/HOLMIUM LASER WITH RIGHT STENT PLACEMENT;  Surgeon: Sebastian Ache, MD;  Location: [facility];  Service: Urology;  Laterality: Right;   PARATHYROIDECTOMY     2-3 removed   REPLACEMENT TOTAL KNEE BILATERAL  1999, 2005   Right leg femur repaired     THYROIDECTOMY     TONSILLECTOMY     TUBAL LIGATION       OB History    Gravida  5    Para  5   Term      Preterm      AB      Living        SAB      TAB      Ectopic      Multiple      Live Births               Home Medications    Prior to Admission medications   Medication Sig Start Date End Date Taking? Authorizing Provider  acetaminophen (TYLENOL) 325 MG tablet Take 650 mg by mouth every 8 (eight) hours as needed for mild pain or moderate pain.     [provider]  aspirin 81 MG tablet Take 81 mg by mouth daily.     [provider]  cetirizine (ZYRTEC) 10 MG chewable tablet Chew 10 mg by mouth daily as needed for allergies.     [provider]  fluticasone (FLONASE) 50 MCG/ACT nasal spray Place 2 sprays into both nostrils daily.    [provider]  levothyroxine (SYNTHROID) 150 MCG tablet Take 150 mcg by mouth daily before breakfast. Patient is to use name brand medication (Synthroid only)    [provider]  loperamide (IMODIUM A-D) 2 MG tablet Take 4 mg by mouth as needed for diarrhea or loose stools. 4mg  1st dose, then 2mg  after each loose stool x48 hrs    [provider]  Melatonin 5 MG TABS Take 5 mg by mouth at bedtime.    [provider]  metolazone (ZAROXOLYN) 2.5 MG tablet Take 2.5 mg by mouth daily.    [provider]  omeprazole (PRILOSEC) 20 MG capsule Take 20 mg by mouth 2 (two) times a day.     [provider]  ondansetron (ZOFRAN) 4 MG tablet TAKE (1) TABLET BY MOUTH EVERY SIX HOURS AS NEEDED FOR NAUSEA. 08/22/18   Mast, Man X, NP  potassium chloride SA (K-DUR) 20 MEQ tablet Take 60 mEq by mouth 2 (two) times daily.     [provider]  saccharomyces boulardii (FLORASTOR) 250 MG capsule Take 250 mg by mouth daily.    [provider]  sertraline (ZOLOFT) 50 MG tablet Take 1.5 tablets (75 mg total) by mouth daily. 05/23/18 05/23/19  Mast, Man X, NP  vitamin B-12 (CYANOCOBALAMIN) 1000 MCG tablet Take 2,000 mcg by mouth daily. 2 tabs    [provider]    Family History Family History  Problem Relation Age of Onset   Emphysema Sister    Coronary artery disease Neg Hx     Social History Social History   Tobacco Use   Smoking status: Former Smoker    Packs/day: 0.25    Years: 2.00    Pack years: 0.50    Types: Cigarettes    Last attempt to quit: 06/19/1974    Years since quitting: 44.4   Smokeless tobacco: Never Used  Tobacco comment: Quit in late 20's  Substance Use Topics   Alcohol use: No    Alcohol/week: 0.0 standard drinks    Comment: rare   Drug use: No     Allergies   Azithromycin; Beta adrenergic blockers; Ciprofloxacin; Codeine; Darvon [propoxyphene]; Epinephrine; Klonopin [clonazepam]; Oxycodone; Paxil [paroxetine hydrochloride]; Prednisone; Propoxyphene hcl; Tape; Torsemide; Tramadol; Vicodin [hydrocodone-acetaminophen]; Meloxicam; Other; and Vancomycin   Review of Systems Review of Systems  Constitutional: Negative for fever.  Cardiovascular: Negative for chest pain.  Gastrointestinal: Negative for abdominal pain.  Musculoskeletal: Positive for arthralgias.  Neurological: Positive for headaches.  All other systems reviewed and are negative.    Physical Exam Updated Vital Signs BP (!) 153/81 (BP Location: Left Arm)    Pulse 68    Temp 97.9 F (36.6 C) (Oral)    Resp 18    Ht 1.575 m ($Remove'5\' 2"'JFIrBam$ )    Wt 128.4 kg    SpO2 94%    BMI 51.76 kg/m   Physical Exam  CONSTITUTIONAL: elderly, no distress HEAD: laceration to forehead, no other signs of trauma EYES: EOMI/PERRL ENMT: Mucous membranes moist, no visible trauma SPINE/BACK:cervical tenderness, no thoracic/lumbar tenderness, No bruising/crepitance/stepoffs noted to spine CV: S1/S2 noted, no murmurs/rubs/gallops noted LUNGS: Lungs are clear to auscultation bilaterally, no apparent distress Chest - no tenderness ABDOMEN: soft, nontender, no rebound or guarding, bowel sounds noted throughout abdomen GU:no cva tenderness NEURO: Pt is  awake/alert/appropriate, moves all extremitiesx4.  No facial droop.  GCS 15 EXTREMITIES: pulses normal/equal, full ROM, bilateral knee tenderness/bruising.  No tenderness with ROM of either knee.  All other extremities/joints palpated/ranged and nontender SKIN: warm, color normal PSYCH: no abnormalities of mood noted, alert and oriented to situation  ED Treatments / Results  Labs (all labs ordered are listed, but only abnormal results are displayed) Labs Reviewed - No data to display  EKG None  Radiology Ct Head Wo Contrast  Result Date: 11/14/2018 CLINICAL DATA:  Recent fall with headaches and neck pain, initial encounter EXAM: CT HEAD WITHOUT CONTRAST CT CERVICAL SPINE WITHOUT CONTRAST TECHNIQUE: Multidetector CT imaging of the head and cervical spine was performed following the standard protocol without intravenous contrast. Multiplanar CT image reconstructions of the cervical spine were also generated. COMPARISON:  03/17/2016 FINDINGS: CT HEAD FINDINGS Brain: Mild atrophic changes and chronic white matter ischemic changes are noted. No findings to suggest acute hemorrhage, acute infarction or space-occupying mass lesion are seen. Vascular: No hyperdense vessel or unexpected calcification. Skull: Normal. Negative for fracture or focal lesion. Sinuses/Orbits: No acute finding. Other: Scalp hematoma is noted in the left frontal region. CT CERVICAL SPINE FINDINGS Alignment: Within normal limits. Skull base and vertebrae: 7 cervical segments are well visualized. Disc space narrowing is noted from C4-C7 with mild associated osteophytic changes identified. Multilevel facet hypertrophic changes are seen. No acute fracture or dislocation is noted. The odontoid is within normal limits. Soft tissues and spinal canal: Surrounding soft tissue structures are within normal limits. Upper chest: Visualized lung apices are unremarkable. Other: None IMPRESSION: CT of the head: Chronic atrophic and ischemic changes.  Left scalp hematoma related to the recent injury. CT of the cervical spine: Multilevel degenerative change without acute abnormality. Electronically Signed   By: Inez Catalina M.D.   On: 11/14/2018 01:47   Ct Cervical Spine Wo Contrast  Result Date: 11/14/2018 CLINICAL DATA:  Recent fall with headaches and neck pain, initial encounter EXAM: CT HEAD WITHOUT CONTRAST CT CERVICAL SPINE WITHOUT CONTRAST TECHNIQUE: Multidetector CT imaging of the  head and cervical spine was performed following the standard protocol without intravenous contrast. Multiplanar CT image reconstructions of the cervical spine were also generated. COMPARISON:  03/17/2016 FINDINGS: CT HEAD FINDINGS Brain: Mild atrophic changes and chronic white matter ischemic changes are noted. No findings to suggest acute hemorrhage, acute infarction or space-occupying mass lesion are seen. Vascular: No hyperdense vessel or unexpected calcification. Skull: Normal. Negative for fracture or focal lesion. Sinuses/Orbits: No acute finding. Other: Scalp hematoma is noted in the left frontal region. CT CERVICAL SPINE FINDINGS Alignment: Within normal limits. Skull base and vertebrae: 7 cervical segments are well visualized. Disc space narrowing is noted from C4-C7 with mild associated osteophytic changes identified. Multilevel facet hypertrophic changes are seen. No acute fracture or dislocation is noted. The odontoid is within normal limits. Soft tissues and spinal canal: Surrounding soft tissue structures are within normal limits. Upper chest: Visualized lung apices are unremarkable. Other: None IMPRESSION: CT of the head: Chronic atrophic and ischemic changes. Left scalp hematoma related to the recent injury. CT of the cervical spine: Multilevel degenerative change without acute abnormality. Electronically Signed   By: Inez Catalina M.D.   On: 11/14/2018 01:47   Dg Knee Complete 4 Views Left  Result Date: 11/14/2018 CLINICAL DATA:  Left knee pain, initial  encounter EXAM: LEFT KNEE - COMPLETE 4+ VIEW COMPARISON:  None. FINDINGS: Left knee prosthesis is noted. No acute fracture or loosening is seen. No soft tissue abnormality is noted. IMPRESSION: Left knee prosthesis without acute abnormality. Electronically Signed   By: Inez Catalina M.D.   On: 11/14/2018 01:41   Dg Knee Complete 4 Views Right  Result Date: 11/14/2018 CLINICAL DATA:  Right knee pain EXAM: RIGHT KNEE - COMPLETE 4+ VIEW COMPARISON:  03/17/2016 FINDINGS: Right knee prosthesis is again identified. Medullary rod is noted within the femur as well as changes of prior distal fracture with some degree of healing. The overall appearance is stable. No new focal abnormality is seen. IMPRESSION: Right knee prosthesis as well as medullary rod fixation of the right femur. No acute abnormality is noted. Electronically Signed   By: Inez Catalina M.D.   On: 11/14/2018 01:40    Procedures .Marland KitchenLaceration Repair Date/Time: 11/14/2018 2:20 AM Performed by: Ripley Fraise, MD Authorized by: Ripley Fraise, MD   Consent:    Consent obtained:  Verbal   Consent given by:  Patient Laceration details:    Location:  Scalp   Scalp location:  Frontal   Length (cm):  3 Repair type:    Repair type:  Simple Pre-procedure details:    Preparation:  Patient was prepped and draped in usual sterile fashion Exploration:    Wound exploration: wound explored through full range of motion and entire depth of wound probed and visualized     Contaminated: no   Treatment:    Amount of cleaning:  Standard Skin repair:    Repair method:  Sutures   Suture size:  4-0   Suture material:  Plain gut   Suture technique:  Simple interrupted   Number of sutures:  4 Approximation:    Approximation:  Close Post-procedure details:    Patient tolerance of procedure:  Tolerated well, no immediate complications      Medications Ordered in ED Medications  lidocaine (PF) (XYLOCAINE) 1 % injection 5 mL (has no  administration in time range)  acetaminophen (TYLENOL) tablet 1,000 mg (1,000 mg Oral Given 11/14/18 0046)  Tdap (BOOSTRIX) injection 0.5 mL (0.5 mLs Intramuscular Given 11/14/18 0051)  Initial Impression / Assessment and Plan / ED Course  I have reviewed the triage vital signs and the nursing notes.  Pertinent   Labs/imaging results that were available during my care of the patient were reviewed by me and considered in my medical decision making (see chart for details).        12:44 AM Pt with  mechanical fall, sustaining a head laceration as well as bilateral knee pain.  She also has C-spine tenderness.  Will obtain CT head and C-spine.  Will also obtain knee x-rays 1:00 AM Pt told nursing she has had hallucinations She does not appear psychotic Thinks she may have uti Will add on labs 2:18 AM Pt tolerated wound repair well Discussed hallucinations, she reports at times when she moves her head she may see extra items in the room that may not actually be there.  She is not having auditory hallucinations. She reports the nursing facility is aware of this and plans to do more testing in the morning.  We will cancel all labs.  CT head is negative.  She does not appear psychotic. We will discharge back to facility Final Clinical Impressions(s) / ED Diagnoses   Final diagnoses:  Injury of head, initial encounter  Laceration of scalp, initial encounter  Acute cervical myofascial strain, initial encounter  Contusion of right knee, initial encounter  Contusion of left knee, initial encounter    ED Discharge Orders    None       Ripley Fraise, MD 11/14/18 [phone]

## 2018-11-14 NOTE — ED Notes (Signed)
Bed: WA17 Expected date:  Expected time:  Means of arrival:  Comments: Fall, head laceration

## 2018-11-15 ENCOUNTER — Encounter: Payer: Self-pay | Admitting: Nurse Practitioner

## 2018-11-15 DIAGNOSIS — R269 Unspecified abnormalities of gait and mobility: Secondary | ICD-10-CM | POA: Insufficient documentation

## 2018-11-15 DIAGNOSIS — W19XXXA Unspecified fall, initial encounter: Secondary | ICD-10-CM | POA: Insufficient documentation

## 2018-11-15 DIAGNOSIS — S0181XA Laceration without foreign body of other part of head, initial encounter: Secondary | ICD-10-CM | POA: Insufficient documentation

## 2018-11-15 NOTE — Progress Notes (Signed)
Location:   [facility] Room Number: 907/A Place of Service:  [facility] (13) Provider: Lennie Odor Aliannah Holstrom NP  Virgie Dad, MD  Patient Care Team: Virgie Dad, MD as PCP - General (Internal Medicine) Minus Breeding, MD as PCP - Cardiology (Cardiology) Azucena Fallen, MD as Consulting Physician (Obstetrics and Gynecology) Chesley Mires, MD as Consulting Physician (Pulmonary Disease) Janey Petron X, NP as Nurse Practitioner (Internal Medicine) Christin Fudge, MD as Consulting Physician (Surgery)  Extended Emergency Contact Information Primary Emergency Contact: Boxman,Lee Address: [address]          [place], [place] [address] Johnnette Litter of [place] Phone: [phone] Mobile Phone: [phone] Relation: Daughter Secondary Emergency Contact: Ritta Slot [place] of [place] Mobile Phone: [phone] Relation: Daughter  Code Status: DNR Goals of care: Advanced Directive information Advanced Directives 11/14/2018  Does Patient Have a Medical Advance Directive? Yes  Type of Paramedic of [place];Living will;Out of facility DNR (pink MOST or yellow form)  Does patient want to make changes to medical advance directive? No - Patient declined  Copy of [place] in Chart? Yes - validated most recent copy scanned in chart (See row information)  Would patient like information on creating a medical advance directive? -  Pre-existing out of facility DNR order (yellow form or pink MOST form) Pink MOST form placed in chart (order not valid for inpatient use);Yellow form placed in chart (order not valid for inpatient use)     Chief Complaint  Patient presents with   Acute Visit    Hallunication,fall ED follow up     HPI:  Pt is a 85 y.o. female seen today for an acute visit for visual hallucination, seeing people are not present to others. Hx of depression, on Sertraline $RemoveBefor'100mg'grnphRHaXvCF$  qd. Hx of BLE edema, on Metolazone 2.$RemoveBeforeD'5mg'VXfVomvkKzcgsX$  qd.  Hypothyroidism, on Levothyroxine 140mcg, recently elevated TSH was caused non medication compliance at home, f/u TSH scheduled. GERD, stable on Omeprazole $RemoveBefor'20mg'jBzqDqKfSmAG$  bid. She sustained left forehead laceration, s/p ED eval , suture closures, no s/s of infection. CT head/cervical spine X-ray R+L knees are unremarkable.    Past Medical History:  Diagnosis Date   ARTHRITIS, KNEES, BILATERAL    Breast CA ([facility]) 2000   s/p R lumpectomy and XRT   CHF (congestive heart failure) (HCC)    DEPRESSION    DJD (degenerative joint disease) of knee    GERD    HYPERLIPIDEMIA    HYPERTENSION    HYPOTHYROIDISM    postsurgical   Hypoxia    MIGRAINE HEADACHE    Morbid obesity (HCC)    OSA (obstructive sleep apnea) 01/17/2011 dx   Oxygen dependent    Urge incontinence    UTI (lower urinary tract infection) 12/2014   Past Surgical History:  Procedure Laterality Date   ABDOMINAL HYSTERECTOMY     ADENOIDECTOMY     BREAST BIOPSY  2000   BREAST LUMPECTOMY  2000   CATARACT EXTRACTION     CHOLECYSTECTOMY  1995   CYSTOSCOPY/URETEROSCOPY/HOLMIUM LASER Right 06/28/2015   Procedure: CYSTOSCOPY RIGHT RETROGRAD RIGHT URETEROSCOPY/HOLMIUM LASER WITH RIGHT STENT PLACEMENT;  Surgeon: Alexis Frock, MD;  Location: [facility];  Service: Urology;  Laterality: Right;   PARATHYROIDECTOMY     2-3 removed   REPLACEMENT TOTAL KNEE BILATERAL  1999, 2005   Right leg femur repaired     THYROIDECTOMY     TONSILLECTOMY     TUBAL LIGATION      Allergies  Allergen Reactions  Azithromycin Itching   Beta Adrenergic Blockers Other (See Comments)    Depression    Ciprofloxacin Other (See Comments)    Edgy and very jumpy     Codeine Nausea And Vomiting   Darvon [Propoxyphene] Nausea And Vomiting   Epinephrine Other (See Comments)    Rapid pulse, sweats   Klonopin [Clonazepam] Other (See Comments)    Makes her feel very suicidal    Oxycodone Other (See Comments)    Patient felt like it  altered her mental status "Crazy" . Hallucinations later as well.   Paxil [Paroxetine Hydrochloride] Other (See Comments)    Severe depression    Prednisone Other (See Comments)    "makes me feel really bad"   Propoxyphene Hcl Nausea And Vomiting   Tape Itching   Torsemide Other (See Comments)    Patient stated that she doesn't recall the reaction   Tramadol Other (See Comments)    Feels "jittery"   Vicodin [Hydrocodone-Acetaminophen] Other (See Comments)    Hallucinations   Meloxicam Diarrhea   Other Rash    Polyester sheets "cause me a rash"   Vancomycin Rash    Localized rash related to infusion rate    Allergies as of 11/14/2018      Reactions   Azithromycin Itching   Beta Adrenergic Blockers Other (See Comments)   Depression   Ciprofloxacin Other (See Comments)   Edgy and very jumpy    Codeine Nausea And Vomiting   Darvon [propoxyphene] Nausea And Vomiting   Epinephrine Other (See Comments)   Rapid pulse, sweats   Klonopin [clonazepam] Other (See Comments)   Makes her feel very suicidal    Oxycodone Other (See Comments)   Patient felt like it altered her mental status "Crazy" . Hallucinations later as well.   Paxil [paroxetine Hydrochloride] Other (See Comments)   Severe depression    Prednisone Other (See Comments)   "makes me feel really bad"   Propoxyphene Hcl Nausea And Vomiting   Tape Itching   Torsemide Other (See Comments)   Patient stated that she doesn't recall the reaction   Tramadol Other (See Comments)   Feels "jittery"   Vicodin [hydrocodone-acetaminophen] Other (See Comments)   Hallucinations   Meloxicam Diarrhea   Other Rash   Polyester sheets "cause me a rash"   Vancomycin Rash   Localized rash related to infusion rate      Medication List       Accurate as of Nov 14, 2018 11:59 PM. If you have any questions, ask your nurse or doctor.        acetaminophen 325 MG tablet Commonly known as:  TYLENOL Take 650 mg by mouth every 8  (eight) hours as needed for mild pain or moderate pain.   aspirin 81 MG tablet Take 81 mg by mouth daily.   cetirizine 10 MG chewable tablet Commonly known as:  ZYRTEC Chew 10 mg by mouth daily as needed for allergies.   fluticasone 50 MCG/ACT nasal spray Commonly known as:  FLONASE Place 2 sprays into both nostrils daily.   loperamide 2 MG tablet Commonly known as:  IMODIUM A-D Take 4 mg by mouth as needed for diarrhea or loose stools. $RemoveBefor'4mg'iVSBlDphlxdv$  1st dose, then $RemoveBe'2mg'OSxtyarPN$  after each loose stool x48 hrs   Melatonin 5 MG Tabs Take 5 mg by mouth at bedtime.   metolazone 2.5 MG tablet Commonly known as:  ZAROXOLYN Take 2.5 mg by mouth daily.   omeprazole 20 MG capsule Commonly known as:  PRILOSEC  Take 20 mg by mouth 2 (two) times a day.   ondansetron 4 MG tablet Commonly known as:  ZOFRAN TAKE (1) TABLET BY MOUTH EVERY SIX HOURS AS NEEDED FOR NAUSEA.   potassium chloride SA 20 MEQ tablet Commonly known as:  K-DUR Take 60 mEq by mouth 2 (two) times daily.   saccharomyces boulardii 250 MG capsule Commonly known as:  FLORASTOR Take 250 mg by mouth daily.   sertraline 100 MG tablet Commonly known as:  ZOLOFT Take 100 mg by mouth daily. What changed:  Another medication with the same name was removed. Continue taking this medication, and follow the directions you see here. Changed by:  Tonantzin Mimnaugh X Brendaliz Kuk, NP   Synthroid 150 MCG tablet Generic drug:  levothyroxine Take 150 mcg by mouth daily before breakfast. Patient is to use name brand medication (Synthroid only)   vitamin B-12 1000 MCG tablet Commonly known as:  CYANOCOBALAMIN Take 2,000 mcg by mouth daily. 2 tabs      ROS was provided with assistance of staff.  Review of Systems  Constitutional: Positive for fatigue. Negative for activity change, appetite change, chills, diaphoresis and fever.  HENT: Positive for hearing loss. Negative for congestion and voice change.   Respiratory: Positive for shortness of breath. Negative for  cough and wheezing.        Chronic DOE  Cardiovascular: Positive for leg swelling. Negative for chest pain and palpitations.  Gastrointestinal: Negative for abdominal distention, abdominal pain, constipation, diarrhea, nausea and vomiting.  Genitourinary: Negative for difficulty urinating, dysuria and urgency.  Musculoskeletal: Positive for arthralgias, back pain and gait problem.  Skin: Positive for color change and wound.       Bruised R+L knees. Left forehead laceration with suture closure. Left facial and peri orbital bruise.   Neurological: Negative for dizziness, speech difficulty, weakness and headaches.       Memory lapses.   Psychiatric/Behavioral: Positive for hallucinations. Negative for agitation, behavioral problems and sleep disturbance. The patient is nervous/anxious.     Immunization History  Administered Date(s) Administered   Influenza Split 03/20/2011   Influenza Whole 03/19/2009, 03/19/2012   Influenza, High Dose Seasonal PF 03/16/2018   Influenza,inj,Quad PF,6+ Mos 03/19/2013   Influenza-Unspecified 03/03/2014, 03/15/2015, 03/30/2016, 04/05/2017   Meningococcal Polysaccharide 03/15/2015   PPD Test 03/30/2016   Pneumococcal Conjugate-13 03/12/2014   Pneumococcal Polysaccharide-23 06/19/2006, 03/15/2015   Td 10/04/2009   Tdap 06/19/2005, 11/14/2018   Zoster 06/19/2010   Zoster Recombinat (Shingrix) 01/28/2018, 04/22/2018, 05/22/2018   Pertinent  Health Maintenance Due  Topic Date Due   FOOT EXAM  09/08/1940   OPHTHALMOLOGY EXAM  09/08/1940   URINE MICROALBUMIN  09/08/1940   DEXA SCAN  09/07/1997   HEMOGLOBIN A1C  11/19/2018   INFLUENZA VACCINE  01/18/2019   PNA vac Low Risk Adult  Completed   Fall Risk  08/30/2017 06/07/2017 12/22/2016 04/06/2016 11/27/2015  Falls in the past year? No No Yes Yes No  Number falls in past yr: - - 1 1 -  Injury with Fall? - - No Yes -  Risk for fall due to : - - - - -   Functional Status Survey:     Vitals:   11/14/18 1118  BP: (!) 160/80  Pulse: 70  Resp: 18  Temp: 98.1 F (36.7 C)  SpO2: 95%  Weight: 282 lb 12.8 oz (128.3 kg)  Height: 5\' 5"  (1.651 m)   Body mass index is 47.06 kg/m. Physical Exam Constitutional:      General: She  is not in acute distress.    Appearance: Normal appearance. She is not ill-appearing, toxic-appearing or diaphoretic.  HENT:     Head: Normocephalic.     Nose: Nose normal. No congestion or rhinorrhea.     Mouth/Throat:     Mouth: Mucous membranes are moist.  Eyes:     Extraocular Movements: Extraocular movements intact.     Conjunctiva/sclera: Conjunctivae normal.     Pupils: Pupils are equal, round, and reactive to light.  Neck:     Musculoskeletal: Normal range of motion and neck supple.  Cardiovascular:     Rate and Rhythm: Normal rate and regular rhythm.     Heart sounds: No murmur.  Pulmonary:     Effort: Pulmonary effort is normal.     Breath sounds: No wheezing, rhonchi or rales.  Abdominal:     Palpations: Abdomen is soft.     Tenderness: There is no abdominal tenderness. There is no right CVA tenderness, left CVA tenderness, guarding or rebound.  Musculoskeletal:     Right lower leg: Edema present.     Left lower leg: Edema present.     Comments: Chronic edema BLE 2+. Ambulates with motorized scooter.   Skin:    General: Skin is warm and dry.     Findings: Bruising present.     Comments: Bruised R+L knees. Left forehead laceration with suture closure. Left facial and peri orbital bruise.    Neurological:     General: No focal deficit present.     Mental Status: She is alert. Mental status is at baseline.     Cranial Nerves: No cranial nerve deficit.     Motor: No weakness.     Coordination: Coordination normal.     Gait: Gait abnormal.     Comments: Oriented to person and place.   Psychiatric:        Attention and Perception: She perceives visual hallucinations. She does not perceive auditory hallucinations.         Mood and Affect: Mood is anxious and depressed. Affect is not flat, angry, tearful or inappropriate.        Behavior: Behavior is slowed. Behavior is not aggressive, withdrawn, hyperactive or combative.        Thought Content: Thought content is not paranoid or delusional. Thought content does not include suicidal ideation.        Cognition and Memory: Cognition is impaired. Memory is impaired.        Judgment: Judgment is impulsive. Judgment is not inappropriate.     Labs reviewed: Recent Labs    01/01/18 0715 01/10/18 0000  05/20/18 0000  08/09/18 0533 08/10/18 0535 10/30/18 11/06/18 1318  NA 142 141   < > 139   < > 138 139 143 140  K 4.0 3.7   < > 3.6   < > 3.8 4.1 3.9 3.9  CL 100 99   < > 100   < > 99 100  --  100  CO2 36* 33*   < > 31   < > 30 31  --  32  GLUCOSE 115* 86   < > 114*   < > 133* 103*  --  95  BUN 17 22   < > 21   < > $R'23 10 13 'KC$ 7*  CREATININE 0.86 0.89*   < > 0.92*   < > 0.97 0.75 0.9 0.99  CALCIUM 8.0* 8.1*   < > 8.1*   < > 8.1* 7.5*  --  7.9*  MG 1.8 1.7  --  1.6  --   --   --   --   --    < > = values in this interval not displayed.   Recent Labs    04/23/18 1519 05/20/18 0000 08/10/18 0535 10/30/18 11/06/18 1318  AST $Re'20 16 21 14 'xFi$ 13*  ALT $Re'12 11 11 8 10  'fVc$ ALKPHOS 84  --  79 78 69  BILITOT 0.7 0.5 0.9  --  0.6  PROT 6.6 6.4 5.7*  --  5.7*  ALBUMIN 3.0*  --  2.8*  --  2.7*   Recent Labs    07/05/18 1248  08/09/18 0533 08/10/18 0621 10/30/18 11/06/18 1318  WBC 6.3  --  7.3 5.4 7.3 6.6  NEUTROABS 4.7  --  4.6  --   --  4.7  HGB 12.9   < > 12.2 11.5* 12.6 11.7*  HCT 41.0   < > 39.1 38.6 39 37.7  MCV 92.3  --  96.3 98.2  --  92.2  PLT 142*  --  129* 133* 132* 140*   < > = values in this interval not displayed.   Lab Results  Component Value Date   TSH 20.08 (A) 10/30/2018   Lab Results  Component Value Date   HGBA1C 6.5 (H) 05/20/2018   Lab Results  Component Value Date   CHOL 153 03/05/2018   HDL 45 (L) 03/05/2018   LDLCALC 86 03/05/2018    LDLDIRECT 99.4 03/13/2012   TRIG 127 03/05/2018   CHOLHDL 3.4 03/05/2018    Significant Diagnostic Results in last 30 days:  Ct Head Wo Contrast  Result Date: 11/14/2018 CLINICAL DATA:  Recent fall with headaches and neck pain, initial encounter EXAM: CT HEAD WITHOUT CONTRAST CT CERVICAL SPINE WITHOUT CONTRAST TECHNIQUE: Multidetector CT imaging of the head and cervical spine was performed following the standard protocol without intravenous contrast. Multiplanar CT image reconstructions of the cervical spine were also generated. COMPARISON:  03/17/2016 FINDINGS: CT HEAD FINDINGS Brain: Mild atrophic changes and chronic white matter ischemic changes are noted. No findings to suggest acute hemorrhage, acute infarction or space-occupying mass lesion are seen. Vascular: No hyperdense vessel or unexpected calcification. Skull: Normal. Negative for fracture or focal lesion. Sinuses/Orbits: No acute finding. Other: Scalp hematoma is noted in the left frontal region. CT CERVICAL SPINE FINDINGS Alignment: Within normal limits. Skull base and vertebrae: 7 cervical segments are well visualized. Disc space narrowing is noted from C4-C7 with mild associated osteophytic changes identified. Multilevel facet hypertrophic changes are seen. No acute fracture or dislocation is noted. The odontoid is within normal limits. Soft tissues and spinal canal: Surrounding soft tissue structures are within normal limits. Upper chest: Visualized lung apices are unremarkable. Other: None IMPRESSION: CT of the head: Chronic atrophic and ischemic changes. Left scalp hematoma related to the recent injury. CT of the cervical spine: Multilevel degenerative change without acute abnormality. Electronically Signed   By: Inez Catalina M.D.   On: 11/14/2018 01:47   Ct Cervical Spine Wo Contrast  Result Date: 11/14/2018 CLINICAL DATA:  Recent fall with headaches and neck pain, initial encounter EXAM: CT HEAD WITHOUT CONTRAST CT CERVICAL SPINE  WITHOUT CONTRAST TECHNIQUE: Multidetector CT imaging of the head and cervical spine was performed following the standard protocol without intravenous contrast. Multiplanar CT image reconstructions of the cervical spine were also generated. COMPARISON:  03/17/2016 FINDINGS: CT HEAD FINDINGS Brain: Mild atrophic changes and chronic white matter ischemic changes are noted. No findings  to suggest acute hemorrhage, acute infarction or space-occupying mass lesion are seen. Vascular: No hyperdense vessel or unexpected calcification. Skull: Normal. Negative for fracture or focal lesion. Sinuses/Orbits: No acute finding. Other: Scalp hematoma is noted in the left frontal region. CT CERVICAL SPINE FINDINGS Alignment: Within normal limits. Skull base and vertebrae: 7 cervical segments are well visualized. Disc space narrowing is noted from C4-C7 with mild associated osteophytic changes identified. Multilevel facet hypertrophic changes are seen. No acute fracture or dislocation is noted. The odontoid is within normal limits. Soft tissues and spinal canal: Surrounding soft tissue structures are within normal limits. Upper chest: Visualized lung apices are unremarkable. Other: None IMPRESSION: CT of the head: Chronic atrophic and ischemic changes. Left scalp hematoma related to the recent injury. CT of the cervical spine: Multilevel degenerative change without acute abnormality. Electronically Signed   By: Inez Catalina M.D.   On: 11/14/2018 01:47   Dg Chest Portable 1 View  Result Date: 11/06/2018 CLINICAL DATA:  Shortness of breath and cough for 1 month. EXAM: PORTABLE CHEST 1 VIEW COMPARISON:  Chest x-ray August 09, 2018 FINDINGS: The heart size and mediastinal contours are stable. The heart size is enlarged. Both lungs are clear. The visualized skeletal structures are unremarkable. IMPRESSION: No active cardiopulmonary disease. Electronically Signed   By: Abelardo Diesel M.D.   On: 11/06/2018 13:42   Dg Knee Complete 4  Views Left  Result Date: 11/14/2018 CLINICAL DATA:  Left knee pain, initial encounter EXAM: LEFT KNEE - COMPLETE 4+ VIEW COMPARISON:  None. FINDINGS: Left knee prosthesis is noted. No acute fracture or loosening is seen. No soft tissue abnormality is noted. IMPRESSION: Left knee prosthesis without acute abnormality. Electronically Signed   By: Inez Catalina M.D.   On: 11/14/2018 01:41   Dg Knee Complete 4 Views Right  Result Date: 11/14/2018 CLINICAL DATA:  Right knee pain EXAM: RIGHT KNEE - COMPLETE 4+ VIEW COMPARISON:  03/17/2016 FINDINGS: Right knee prosthesis is again identified. Medullary rod is noted within the femur as well as changes of prior distal fracture with some degree of healing. The overall appearance is stable. No new focal abnormality is seen. IMPRESSION: Right knee prosthesis as well as medullary rod fixation of the right femur. No acute abnormality is noted. Electronically Signed   By: Inez Catalina M.D.   On: 11/14/2018 01:40    Assessment/Plan: Hallucination, visual 11/14/18 Seroquel 12.5mg  qhs, Vs Sat O2 q shift. May consider MRI of brain, pending Neurology consultation.    Depression with anxiety Not well controlled, continue Sertraline 100mg  qd, observe.   Lymphedema Chronic, no open wounds, continue Metolazone 2.5mg  qd, observe.   Hypothyroidism (acquired) Continue Levothyroxine 162mcg qd, pending TSH.   Acid reflux Stable, continue Omeprazole 20mg  qd.   Laceration of skin of forehead Left forehead, suture closure is intact, removes sutures in 7-10 days.   Fall Due to lack of safety awareness and increased frailty, SN[facility] for safety and care assistance. Observe.   Gait abnormality Has been using motorized scooter for ambulation for a years.     Family/ staff Communication: plan of care reviewed with the patient and charge nurse.   Labs/tests ordered:  None  Time spend 40 minutes.

## 2018-11-15 NOTE — Assessment & Plan Note (Signed)
Chronic, no open wounds, continue Metolazone 2.5mg  qd, observe.

## 2018-11-15 NOTE — Assessment & Plan Note (Signed)
Stable, continue Omeprazole 20mg qd.  

## 2018-11-15 NOTE — Assessment & Plan Note (Signed)
Has been using motorized scooter for ambulation for a years.

## 2018-11-15 NOTE — Assessment & Plan Note (Signed)
Not well controlled, continue Sertraline 100mg  qd, observe.

## 2018-11-15 NOTE — Assessment & Plan Note (Signed)
Left forehead, suture closure is intact, removes sutures in 7-10 days.

## 2018-11-15 NOTE — Assessment & Plan Note (Signed)
Due to lack of safety awareness and increased frailty, SNF [facility] for safety and care assistance. Observe.

## 2018-11-15 NOTE — Assessment & Plan Note (Signed)
Continue Levothyroxine 176mcg qd, pending TSH.

## 2018-11-15 NOTE — Assessment & Plan Note (Signed)
11/14/18 Seroquel 12.5mg  qhs, Vs Sat O2 q shift. May consider MRI of brain, pending Neurology consultation.

## 2018-11-18 DIAGNOSIS — N1330 Unspecified hydronephrosis: Secondary | ICD-10-CM | POA: Diagnosis not present

## 2018-11-18 DIAGNOSIS — R2681 Unsteadiness on feet: Secondary | ICD-10-CM | POA: Diagnosis not present

## 2018-11-18 DIAGNOSIS — E876 Hypokalemia: Secondary | ICD-10-CM | POA: Diagnosis not present

## 2018-11-18 DIAGNOSIS — Z7389 Other problems related to life management difficulty: Secondary | ICD-10-CM | POA: Diagnosis not present

## 2018-11-18 DIAGNOSIS — M6281 Muscle weakness (generalized): Secondary | ICD-10-CM | POA: Diagnosis not present

## 2018-11-18 DIAGNOSIS — N201 Calculus of ureter: Secondary | ICD-10-CM | POA: Diagnosis not present

## 2018-11-18 DIAGNOSIS — Z9181 History of falling: Secondary | ICD-10-CM | POA: Diagnosis not present

## 2018-11-18 DIAGNOSIS — J9611 Chronic respiratory failure with hypoxia: Secondary | ICD-10-CM | POA: Diagnosis not present

## 2018-11-18 DIAGNOSIS — R4189 Other symptoms and signs involving cognitive functions and awareness: Secondary | ICD-10-CM | POA: Diagnosis not present

## 2018-11-18 DIAGNOSIS — M129 Arthropathy, unspecified: Secondary | ICD-10-CM | POA: Diagnosis not present

## 2018-11-18 DIAGNOSIS — R29898 Other symptoms and signs involving the musculoskeletal system: Secondary | ICD-10-CM | POA: Diagnosis not present

## 2018-11-18 DIAGNOSIS — F419 Anxiety disorder, unspecified: Secondary | ICD-10-CM | POA: Diagnosis not present

## 2018-11-18 DIAGNOSIS — F329 Major depressive disorder, single episode, unspecified: Secondary | ICD-10-CM | POA: Diagnosis not present

## 2018-11-18 DIAGNOSIS — N390 Urinary tract infection, site not specified: Secondary | ICD-10-CM | POA: Diagnosis not present

## 2018-11-18 DIAGNOSIS — L03115 Cellulitis of right lower limb: Secondary | ICD-10-CM | POA: Diagnosis not present

## 2018-11-18 DIAGNOSIS — K649 Unspecified hemorrhoids: Secondary | ICD-10-CM | POA: Diagnosis not present

## 2018-11-18 DIAGNOSIS — I5032 Chronic diastolic (congestive) heart failure: Secondary | ICD-10-CM | POA: Diagnosis not present

## 2018-11-18 DIAGNOSIS — R41841 Cognitive communication deficit: Secondary | ICD-10-CM | POA: Diagnosis not present

## 2018-11-19 DIAGNOSIS — Z9181 History of falling: Secondary | ICD-10-CM | POA: Diagnosis not present

## 2018-11-19 DIAGNOSIS — R29898 Other symptoms and signs involving the musculoskeletal system: Secondary | ICD-10-CM | POA: Diagnosis not present

## 2018-11-19 DIAGNOSIS — R41841 Cognitive communication deficit: Secondary | ICD-10-CM | POA: Diagnosis not present

## 2018-11-19 DIAGNOSIS — M6281 Muscle weakness (generalized): Secondary | ICD-10-CM | POA: Diagnosis not present

## 2018-11-19 DIAGNOSIS — Z7389 Other problems related to life management difficulty: Secondary | ICD-10-CM | POA: Diagnosis not present

## 2018-11-19 DIAGNOSIS — R2681 Unsteadiness on feet: Secondary | ICD-10-CM | POA: Diagnosis not present

## 2018-11-20 DIAGNOSIS — Z7389 Other problems related to life management difficulty: Secondary | ICD-10-CM | POA: Diagnosis not present

## 2018-11-20 DIAGNOSIS — R41841 Cognitive communication deficit: Secondary | ICD-10-CM | POA: Diagnosis not present

## 2018-11-20 DIAGNOSIS — R2681 Unsteadiness on feet: Secondary | ICD-10-CM | POA: Diagnosis not present

## 2018-11-20 DIAGNOSIS — Z9181 History of falling: Secondary | ICD-10-CM | POA: Diagnosis not present

## 2018-11-20 DIAGNOSIS — M6281 Muscle weakness (generalized): Secondary | ICD-10-CM | POA: Diagnosis not present

## 2018-11-20 DIAGNOSIS — R29898 Other symptoms and signs involving the musculoskeletal system: Secondary | ICD-10-CM | POA: Diagnosis not present

## 2018-11-21 DIAGNOSIS — R2681 Unsteadiness on feet: Secondary | ICD-10-CM | POA: Diagnosis not present

## 2018-11-21 DIAGNOSIS — Z9181 History of falling: Secondary | ICD-10-CM | POA: Diagnosis not present

## 2018-11-21 DIAGNOSIS — R29898 Other symptoms and signs involving the musculoskeletal system: Secondary | ICD-10-CM | POA: Diagnosis not present

## 2018-11-21 DIAGNOSIS — R41841 Cognitive communication deficit: Secondary | ICD-10-CM | POA: Diagnosis not present

## 2018-11-21 DIAGNOSIS — M6281 Muscle weakness (generalized): Secondary | ICD-10-CM | POA: Diagnosis not present

## 2018-11-21 DIAGNOSIS — Z7389 Other problems related to life management difficulty: Secondary | ICD-10-CM | POA: Diagnosis not present

## 2018-11-22 ENCOUNTER — Non-Acute Institutional Stay (SKILLED_NURSING_FACILITY): Payer: Medicare Other | Admitting: Internal Medicine

## 2018-11-22 ENCOUNTER — Encounter: Payer: Self-pay | Admitting: Internal Medicine

## 2018-11-22 DIAGNOSIS — R41841 Cognitive communication deficit: Secondary | ICD-10-CM | POA: Diagnosis not present

## 2018-11-22 DIAGNOSIS — I5032 Chronic diastolic (congestive) heart failure: Secondary | ICD-10-CM

## 2018-11-22 DIAGNOSIS — E039 Hypothyroidism, unspecified: Secondary | ICD-10-CM | POA: Diagnosis not present

## 2018-11-22 DIAGNOSIS — Z9181 History of falling: Secondary | ICD-10-CM | POA: Diagnosis not present

## 2018-11-22 DIAGNOSIS — S8001XS Contusion of right knee, sequela: Secondary | ICD-10-CM | POA: Diagnosis not present

## 2018-11-22 DIAGNOSIS — I10 Essential (primary) hypertension: Secondary | ICD-10-CM

## 2018-11-22 DIAGNOSIS — Z7389 Other problems related to life management difficulty: Secondary | ICD-10-CM | POA: Diagnosis not present

## 2018-11-22 DIAGNOSIS — R2681 Unsteadiness on feet: Secondary | ICD-10-CM | POA: Diagnosis not present

## 2018-11-22 DIAGNOSIS — R29898 Other symptoms and signs involving the musculoskeletal system: Secondary | ICD-10-CM | POA: Diagnosis not present

## 2018-11-22 DIAGNOSIS — M6281 Muscle weakness (generalized): Secondary | ICD-10-CM | POA: Diagnosis not present

## 2018-11-22 NOTE — Progress Notes (Addendum)
Location:  [facility] Room Number: [place] of Service:  SNF [phone]) Provider: Virgie Dad, MD  Virgie Dad, MD  Patient Care Team: Virgie Dad, MD as PCP - General (Internal Medicine) Minus Breeding, MD as PCP - Cardiology (Cardiology) Azucena Fallen, MD as Consulting Physician (Obstetrics and Gynecology) Chesley Mires, MD as Consulting Physician (Pulmonary Disease) Mast, Man X, NP as Nurse Practitioner (Internal Medicine) Christin Fudge, MD as Consulting Physician (Surgery)  Extended Emergency Contact Information Primary Emergency Contact: Boxman,Lee Address: [address]          [place],  [address] Johnnette Litter of [place] Phone: [phone] Mobile Phone: [phone] Relation: Daughter Secondary Emergency Contact: Ritta Slot [place] of [place] Mobile Phone: [phone] Relation: Daughter  Code Status:  FULL Goals of care: Advanced Directive information Advanced Directives 11/22/2018  Does Patient Have a Medical Advance Directive? Yes  Type of Paramedic of [place];Living will  Does patient want to make changes to medical advance directive? No - Patient declined  Copy of [place] in Chart? Yes - validated most recent copy scanned in chart (See row information)  Would patient like information on creating a medical advance directive? -  Pre-existing out of facility DNR order (yellow form or pink MOST form) -     Chief Complaint  Patient presents with  . Acute Visit    Pain and Tenderness of Mid Bilateral Knees     HPI:  Pt is a 85 y.o. female seen today for an acute visit for Swelling and Tenderness in Both Knees.  Patient has h/o Hypertension, Chronic LE edema with Diastolic CHF and Lymphedema, Anxiety, B12 def,OSA but does not use CPAP only Oxygen , Diabetes Type 2  [facility] [place] [facility]. Patient lived in [place] before but was Transferred to [place] as she was  having Depression with Psychosis including Hallucinations.  But patient had fall in [place] and sustained Hematoma in her Head and Face and her Both Knees. She was evaluated in ED with Negative CT scan of head and Knee X rays were Negative For Last few days Nurses have noticed that there is Swelling around her both Knees Right more then Left No pain or Fever She also continues to have Diarrhea. 3-4 times a day. No Abdomina Pain. Appetite is good Weight is stable Her hallucinations are gone since she has been on Seroquel t night but now she is worried about the side effects She is working with therapy and is able to walk with walker and assist.   Past Medical History:  Diagnosis Date  . ARTHRITIS, KNEES, BILATERAL   . Breast CA ([place]) 2000   s/p R lumpectomy and XRT  . CHF (congestive heart failure) ([place])   . DEPRESSION   . DJD (degenerative joint disease) of knee   . GERD   . HYPERLIPIDEMIA   . HYPERTENSION   . HYPOTHYROIDISM    postsurgical  . Hypoxia   . MIGRAINE HEADACHE   . Morbid obesity ([place])   . OSA (obstructive sleep apnea) 01/17/2011 dx  . Oxygen dependent   . Urge incontinence   . UTI (lower urinary tract infection) 12/2014   Past Surgical History:  Procedure Laterality Date  . ABDOMINAL HYSTERECTOMY    . ADENOIDECTOMY    . BREAST BIOPSY  2000  . BREAST LUMPECTOMY  2000  . CATARACT EXTRACTION    . CHOLECYSTECTOMY  1995  . CYSTOSCOPY/URETEROSCOPY/HOLMIUM LASER Right 06/28/2015  Procedure: CYSTOSCOPY RIGHT RETROGRAD RIGHT URETEROSCOPY/HOLMIUM LASER WITH RIGHT STENT PLACEMENT;  Surgeon: Alexis Frock, MD;  Location: [facility];  Service: Urology;  Laterality: Right;  . PARATHYROIDECTOMY     2-3 removed  . REPLACEMENT TOTAL KNEE BILATERAL  1999, 2005  . Right leg femur repaired    . THYROIDECTOMY    . TONSILLECTOMY    . TUBAL LIGATION      Allergies  Allergen Reactions  . Azithromycin Itching  . Beta Adrenergic Blockers Other (See Comments)    Depression   .  Ciprofloxacin Other (See Comments)    Edgy and very jumpy    . Codeine Nausea And Vomiting  . Darvon [Propoxyphene] Nausea And Vomiting  . Epinephrine Other (See Comments)    Rapid pulse, sweats  . Klonopin [Clonazepam] Other (See Comments)    Makes her feel very suicidal   . Oxycodone Other (See Comments)    Patient felt like it altered her mental status "Crazy" . Hallucinations later as well.  [name] Paxil [Paroxetine Hydrochloride] Other (See Comments)    Severe depression   . Prednisone Other (See Comments)    "makes me feel really bad"  . Propoxyphene Hcl Nausea And Vomiting  . Tape Itching  . Torsemide Other (See Comments)    Patient stated that she doesn't recall the reaction  . Tramadol Other (See Comments)    Feels "jittery"  . Vicodin [Hydrocodone-Acetaminophen] Other (See Comments)    Hallucinations  . Meloxicam Diarrhea  . Other Rash    Polyester sheets "cause me a rash"  . Vancomycin Rash    Localized rash related to infusion rate    Outpatient Encounter Medications as of 11/22/2018  Medication Sig  . acetaminophen (TYLENOL) 325 MG tablet Take 650 mg by mouth every 8 (eight) hours as needed for mild pain or moderate pain.   [name] aspirin 81 MG tablet Take 81 mg by mouth daily.   . cetirizine (ZYRTEC) 10 MG chewable tablet Chew 10 mg by mouth daily as needed for allergies.   . cyanocobalamin 2000 MCG tablet Take 2,000 mcg by mouth daily.  [name] levothyroxine (SYNTHROID) 150 MCG tablet Take 150 mcg by mouth daily before breakfast. Patient is to use name brand medication (Synthroid only)  . Melatonin 5 MG TABS Take 5 mg by mouth at bedtime.  . metolazone (ZAROXOLYN) 2.5 MG tablet Take 2.5 mg by mouth daily.  [name] omeprazole (PRILOSEC) 20 MG capsule Take 20 mg by mouth 2 (two) times a day.   . ondansetron (ZOFRAN) 4 MG tablet TAKE (1) TABLET BY MOUTH EVERY SIX HOURS AS NEEDED FOR NAUSEA.  [name] OXYGEN Inhale 2 L into the lungs continuous. KEEP O2 SATS >= 90%. Every Shift  . polyvinyl  alcohol (LIQUIFILM TEARS) 1.4 % ophthalmic solution Place 2 drops into both eyes 4 (four) times daily.  . potassium chloride (MICRO-K) 10 MEQ CR capsule Take 60 mEq by mouth daily. Take 6 capsules  . QUEtiapine (SEROQUEL) 25 MG tablet Take 12.5 mg by mouth at bedtime.  . saccharomyces boulardii (FLORASTOR) 250 MG capsule Take 250 mg by mouth daily.  . sertraline (ZOLOFT) 100 MG tablet Take 100 mg by mouth daily.  . [DISCONTINUED] fluticasone (FLONASE) 50 MCG/ACT nasal spray Place 2 sprays into both nostrils daily.  . [DISCONTINUED] loperamide (IMODIUM A-D) 2 MG tablet Take 4 mg by mouth as needed for diarrhea or loose stools. $RemoveBefor'4mg'CVagOJycxKpv$  1st dose, then $RemoveBe'2mg'xsOZRsDRf$  after each loose stool x48 hrs  . [DISCONTINUED] potassium chloride SA (K-DUR)  20 MEQ tablet Take 60 mEq by mouth 2 (two) times daily.   . [DISCONTINUED] vitamin B-12 (CYANOCOBALAMIN) 1000 MCG tablet Take 2,000 mcg by mouth daily. 2 tabs   No facility-administered encounter medications on file as of 11/22/2018.     Review of Systems  Constitutional: Negative.   HENT: Negative.   Respiratory: Positive for shortness of breath.   Cardiovascular: Positive for leg swelling.  Gastrointestinal: Positive for diarrhea.  Genitourinary: Negative.   Musculoskeletal: Positive for arthralgias and gait problem.  Skin: Positive for color change.  Neurological: Positive for weakness.  Psychiatric/Behavioral: Positive for dysphoric mood.    Immunization History  Administered Date(s) Administered  . Influenza Split 03/20/2011  . Influenza Whole 03/19/2009, 03/19/2012  . Influenza, High Dose Seasonal PF 03/16/2018  . Influenza,inj,Quad PF,6+ Mos 03/19/2013  . Influenza-Unspecified 03/03/2014, 03/15/2015, 03/30/2016, 04/05/2017  . Meningococcal Polysaccharide 03/15/2015  . PPD Test 03/30/2016  . Pneumococcal Conjugate-13 03/12/2014  . Pneumococcal Polysaccharide-23 06/19/2006, 03/15/2015  . Td 10/04/2009  . Tdap 06/19/2005, 11/14/2018  . Zoster 06/19/2010   . Zoster Recombinat (Shingrix) 01/28/2018, 04/22/2018, 05/22/2018   Pertinent  Health Maintenance Due  Topic Date Due  . FOOT EXAM  09/08/1940  . OPHTHALMOLOGY EXAM  09/08/1940  . URINE MICROALBUMIN  09/08/1940  . DEXA SCAN  09/07/1997  . HEMOGLOBIN A1C  11/19/2018  . INFLUENZA VACCINE  01/18/2019  . PNA vac Low Risk Adult  Completed   Fall Risk  08/30/2017 06/07/2017 12/22/2016 04/06/2016 11/27/2015  Falls in the past year? No No Yes Yes No  Number falls in past yr: - - 1 1 -  Injury with Fall? - - No Yes -  Risk for fall due to : - - - - -   Functional Status Survey:    Vitals:   11/22/18 1546  BP: 130/70  Pulse: 86  Resp: 20  Temp: 98.6 F (37 C)  TempSrc: Oral  SpO2: 95%  Weight: 276 lb 9.6 oz (125.5 kg)  Height: $Remove'5\' 5"'cutxhPU$  (1.651 m)   Body mass index is 46.03 kg/m. Physical Exam Vitals signs reviewed.  Constitutional:      Appearance: She is obese.  HENT:     Head: Normocephalic.     Nose: Nose normal.     Mouth/Throat:     Mouth: Mucous membranes are moist.     Pharynx: Oropharynx is clear.  Eyes:     Pupils: Pupils are equal, round, and reactive to light.  Neck:     Musculoskeletal: Neck supple.  Cardiovascular:     Rate and Rhythm: Normal rate and regular rhythm.     Pulses: Normal pulses.     Heart sounds: Normal heart sounds.  Pulmonary:     Effort: Pulmonary effort is normal.     Breath sounds: Normal breath sounds.  Abdominal:     General: Abdomen is flat. Bowel sounds are normal.     Palpations: Abdomen is soft.  Musculoskeletal:     Comments: Had Hematoma on top of Bilateral knee. Right more then Left. No tender or warm. No redness.   Skin:    General: Skin is warm and dry.  Neurological:     General: No focal deficit present.     Mental Status: She is alert and oriented to person, place, and time.     Comments: Walking with the walker and Mild assist  Psychiatric:        Mood and Affect: Mood normal.        Thought Content:  Thought content  normal.        Judgment: Judgment normal.     Labs reviewed: Recent Labs    01/01/18 0715 01/10/18 0000  05/20/18 0000  08/09/18 0533 08/10/18 0535 10/30/18 11/06/18 1318  NA 142 141   < > 139   < > 138 139 143 140  K 4.0 3.7   < > 3.6   < > 3.8 4.1 3.9 3.9  CL 100 99   < > 100   < > 99 100  --  100  CO2 36* 33*   < > 31   < > 30 31  --  32  GLUCOSE 115* 86   < > 114*   < > 133* 103*  --  95  BUN 17 22   < > 21   < > $R'23 10 13 'Me$ 7*  CREATININE 0.86 0.89*   < > 0.92*   < > 0.97 0.75 0.9 0.99  CALCIUM 8.0* 8.1*   < > 8.1*   < > 8.1* 7.5*  --  7.9*  MG 1.8 1.7  --  1.6  --   --   --   --   --    < > = values in this interval not displayed.   Recent Labs    04/23/18 1519 05/20/18 0000 08/10/18 0535 10/30/18 11/06/18 1318  AST $Re'20 16 21 14 'ZzQ$ 13*  ALT $Re'12 11 11 8 10  'iKx$ ALKPHOS 84  --  79 78 69  BILITOT 0.7 0.5 0.9  --  0.6  PROT 6.6 6.4 5.7*  --  5.7*  ALBUMIN 3.0*  --  2.8*  --  2.7*   Recent Labs    07/05/18 1248  08/09/18 0533 08/10/18 0621 10/30/18 11/06/18 1318  WBC 6.3  --  7.3 5.4 7.3 6.6  NEUTROABS 4.7  --  4.6  --   --  4.7  HGB 12.9   < > 12.2 11.5* 12.6 11.7*  HCT 41.0   < > 39.1 38.6 39 37.7  MCV 92.3  --  96.3 98.2  --  92.2  PLT 142*  --  129* 133* 132* 140*   < > = values in this interval not displayed.   Lab Results  Component Value Date   TSH 20.08 (A) 10/30/2018   Lab Results  Component Value Date   HGBA1C 6.5 (H) 05/20/2018   Lab Results  Component Value Date   CHOL 153 03/05/2018   HDL 45 (L) 03/05/2018   LDLCALC 86 03/05/2018   LDLDIRECT 99.4 03/13/2012   TRIG 127 03/05/2018   CHOLHDL 3.4 03/05/2018    Significant Diagnostic Results in last 30 days:  Ct Head Wo Contrast  Result Date: 11/14/2018 CLINICAL DATA:  Recent fall with headaches and neck pain, initial encounter EXAM: CT HEAD WITHOUT CONTRAST CT CERVICAL SPINE WITHOUT CONTRAST TECHNIQUE: Multidetector CT imaging of the head and cervical spine was performed following the standard  protocol without intravenous contrast. Multiplanar CT image reconstructions of the cervical spine were also generated. COMPARISON:  03/17/2016 FINDINGS: CT HEAD FINDINGS Brain: Mild atrophic changes and chronic white matter ischemic changes are noted. No findings to suggest acute hemorrhage, acute infarction or space-occupying mass lesion are seen. Vascular: No hyperdense vessel or unexpected calcification. Skull: Normal. Negative for fracture or focal lesion. Sinuses/Orbits: No acute finding. Other: Scalp hematoma is noted in the left frontal region. CT CERVICAL SPINE FINDINGS Alignment: Within normal limits. Skull base and vertebrae: 7 cervical segments are well  visualized. Disc space narrowing is noted from C4-C7 with mild associated osteophytic changes identified. Multilevel facet hypertrophic changes are seen. No acute fracture or dislocation is noted. The odontoid is within normal limits. Soft tissues and spinal canal: Surrounding soft tissue structures are within normal limits. Upper chest: Visualized lung apices are unremarkable. Other: None IMPRESSION: CT of the head: Chronic atrophic and ischemic changes. Left scalp hematoma related to the recent injury. CT of the cervical spine: Multilevel degenerative change without acute abnormality. Electronically Signed   By: Inez Catalina M.D.   On: 11/14/2018 01:47   Ct Cervical Spine Wo Contrast  Result Date: 11/14/2018 CLINICAL DATA:  Recent fall with headaches and neck pain, initial encounter EXAM: CT HEAD WITHOUT CONTRAST CT CERVICAL SPINE WITHOUT CONTRAST TECHNIQUE: Multidetector CT imaging of the head and cervical spine was performed following the standard protocol without intravenous contrast. Multiplanar CT image reconstructions of the cervical spine were also generated. COMPARISON:  03/17/2016 FINDINGS: CT HEAD FINDINGS Brain: Mild atrophic changes and chronic white matter ischemic changes are noted. No findings to suggest acute hemorrhage, acute  infarction or space-occupying mass lesion are seen. Vascular: No hyperdense vessel or unexpected calcification. Skull: Normal. Negative for fracture or focal lesion. Sinuses/Orbits: No acute finding. Other: Scalp hematoma is noted in the left frontal region. CT CERVICAL SPINE FINDINGS Alignment: Within normal limits. Skull base and vertebrae: 7 cervical segments are well visualized. Disc space narrowing is noted from C4-C7 with mild associated osteophytic changes identified. Multilevel facet hypertrophic changes are seen. No acute fracture or dislocation is noted. The odontoid is within normal limits. Soft tissues and spinal canal: Surrounding soft tissue structures are within normal limits. Upper chest: Visualized lung apices are unremarkable. Other: None IMPRESSION: CT of the head: Chronic atrophic and ischemic changes. Left scalp hematoma related to the recent injury. CT of the cervical spine: Multilevel degenerative change without acute abnormality. Electronically Signed   By: Inez Catalina M.D.   On: 11/14/2018 01:47   Dg Chest Portable 1 View  Result Date: 11/06/2018 CLINICAL DATA:  Shortness of breath and cough for 1 month. EXAM: PORTABLE CHEST 1 VIEW COMPARISON:  Chest x-ray August 09, 2018 FINDINGS: The heart size and mediastinal contours are stable. The heart size is enlarged. Both lungs are clear. The visualized skeletal structures are unremarkable. IMPRESSION: No active cardiopulmonary disease. Electronically Signed   By: Abelardo Diesel M.D.   On: 11/06/2018 13:42   Dg Knee Complete 4 Views Left  Result Date: 11/14/2018 CLINICAL DATA:  Left knee pain, initial encounter EXAM: LEFT KNEE - COMPLETE 4+ VIEW COMPARISON:  None. FINDINGS: Left knee prosthesis is noted. No acute fracture or loosening is seen. No soft tissue abnormality is noted. IMPRESSION: Left knee prosthesis without acute abnormality. Electronically Signed   By: Inez Catalina M.D.   On: 11/14/2018 01:41   Dg Knee Complete 4 Views  Right  Result Date: 11/14/2018 CLINICAL DATA:  Right knee pain EXAM: RIGHT KNEE - COMPLETE 4+ VIEW COMPARISON:  03/17/2016 FINDINGS: Right knee prosthesis is again identified. Medullary rod is noted within the femur as well as changes of prior distal fracture with some degree of healing. The overall appearance is stable. No new focal abnormality is seen. IMPRESSION: Right knee prosthesis as well as medullary rod fixation of the right femur. No acute abnormality is noted. Electronically Signed   By: Inez Catalina M.D.   On: 11/14/2018 01:40    Assessment/Plan Bilateral Knee Hematoma Do not think they are infected. D/W  the Nurses to mark the area and Continue to monitor Any change in color or tenderness to let us know No Antibiotics for Now   Chronic diastolic CHF (congestive heart failure) (HCC) On Metolazone Staying stable Wants to know if potassium can be decreased  Will start her on Aldactone Decrease Potassium to 40 meq Still off her lasix as she does not like to take it  . Essential hypertension Stable Depression with Hallucinations Doing well on Zoloft and Seroquel D/W her that she is on Lowest dose possible and will consider Discontuing once she gets use to this Place Chronic Diarrhea for past 3 months Weight is stable I cannot seem to find her Stool study Per nurses everything was negative. Called Lab. They will send [place] another copy She is on Probiotics Refer to GI. Has not seen anyone for past 10 years  Obesity hypoventilation syndrome (HCC) Does not use CPAP anymore On Chronic Oxygen at night   Gastroesophageal reflux disease without esophagitis On Omeprazole  Hypothyroidism (acquired) TSH was high but  think it was due to Non Compliance Dose was not changed Repeat TSH in 8 weeks pending  Diabetes mellitus type 2 in obese [facility]) Not on Any Meds Last A1C was 6.5  CKD (chronic kidney disease) stage 3, GFR 30-59 ml/min (HCC) Repeat BMP on Aldactone   Vitamin B12 deficiency Continue Supplement Last Level in 01/20 Was WNL   Family/ staff Communication:   Labs/tests ordered:  BMP  Total time spent in this patient care encounter was  45_  minutes; greater than 50% of the visit spent counseling patient and staff, reviewing records , Labs and coordinating care for problems addressed at this encounter.

## 2018-11-25 DIAGNOSIS — Z9181 History of falling: Secondary | ICD-10-CM | POA: Diagnosis not present

## 2018-11-25 DIAGNOSIS — R2681 Unsteadiness on feet: Secondary | ICD-10-CM | POA: Diagnosis not present

## 2018-11-25 DIAGNOSIS — R41841 Cognitive communication deficit: Secondary | ICD-10-CM | POA: Diagnosis not present

## 2018-11-25 DIAGNOSIS — Z7389 Other problems related to life management difficulty: Secondary | ICD-10-CM | POA: Diagnosis not present

## 2018-11-25 DIAGNOSIS — R29898 Other symptoms and signs involving the musculoskeletal system: Secondary | ICD-10-CM | POA: Diagnosis not present

## 2018-11-25 DIAGNOSIS — M6281 Muscle weakness (generalized): Secondary | ICD-10-CM | POA: Diagnosis not present

## 2018-11-26 DIAGNOSIS — Z7389 Other problems related to life management difficulty: Secondary | ICD-10-CM | POA: Diagnosis not present

## 2018-11-26 DIAGNOSIS — M6281 Muscle weakness (generalized): Secondary | ICD-10-CM | POA: Diagnosis not present

## 2018-11-26 DIAGNOSIS — R41841 Cognitive communication deficit: Secondary | ICD-10-CM | POA: Diagnosis not present

## 2018-11-26 DIAGNOSIS — Z9181 History of falling: Secondary | ICD-10-CM | POA: Diagnosis not present

## 2018-11-26 DIAGNOSIS — R2681 Unsteadiness on feet: Secondary | ICD-10-CM | POA: Diagnosis not present

## 2018-11-26 DIAGNOSIS — R29898 Other symptoms and signs involving the musculoskeletal system: Secondary | ICD-10-CM | POA: Diagnosis not present

## 2018-11-27 DIAGNOSIS — R41841 Cognitive communication deficit: Secondary | ICD-10-CM | POA: Diagnosis not present

## 2018-11-27 DIAGNOSIS — Z9181 History of falling: Secondary | ICD-10-CM | POA: Diagnosis not present

## 2018-11-27 DIAGNOSIS — R2681 Unsteadiness on feet: Secondary | ICD-10-CM | POA: Diagnosis not present

## 2018-11-27 DIAGNOSIS — M6281 Muscle weakness (generalized): Secondary | ICD-10-CM | POA: Diagnosis not present

## 2018-11-27 DIAGNOSIS — Z7389 Other problems related to life management difficulty: Secondary | ICD-10-CM | POA: Diagnosis not present

## 2018-11-27 DIAGNOSIS — R29898 Other symptoms and signs involving the musculoskeletal system: Secondary | ICD-10-CM | POA: Diagnosis not present

## 2018-11-28 ENCOUNTER — Telehealth: Payer: Self-pay | Admitting: Physician Assistant

## 2018-11-28 DIAGNOSIS — Z9181 History of falling: Secondary | ICD-10-CM | POA: Diagnosis not present

## 2018-11-28 DIAGNOSIS — E876 Hypokalemia: Secondary | ICD-10-CM | POA: Diagnosis not present

## 2018-11-28 DIAGNOSIS — R2681 Unsteadiness on feet: Secondary | ICD-10-CM | POA: Diagnosis not present

## 2018-11-28 DIAGNOSIS — M6281 Muscle weakness (generalized): Secondary | ICD-10-CM | POA: Diagnosis not present

## 2018-11-28 DIAGNOSIS — R41841 Cognitive communication deficit: Secondary | ICD-10-CM | POA: Diagnosis not present

## 2018-11-28 DIAGNOSIS — R29898 Other symptoms and signs involving the musculoskeletal system: Secondary | ICD-10-CM | POA: Diagnosis not present

## 2018-11-28 DIAGNOSIS — Z7389 Other problems related to life management difficulty: Secondary | ICD-10-CM | POA: Diagnosis not present

## 2018-11-28 LAB — BASIC METABOLIC PANEL
BUN: 14 (ref 4–21)
Creatinine: 0.9 (ref 0.5–1.1)
Glucose: 93
Potassium: 3.6 (ref 3.4–5.3)
Sodium: 141 (ref 137–147)

## 2018-11-28 NOTE — Telephone Encounter (Signed)
Pt was referred by her nurse for chronic diarrhea.  Records from [facility] will be sent to you for review.  Pt requested an in-office visit.  Will you accept this pt?

## 2018-11-29 DIAGNOSIS — M6281 Muscle weakness (generalized): Secondary | ICD-10-CM | POA: Diagnosis not present

## 2018-11-29 DIAGNOSIS — R41841 Cognitive communication deficit: Secondary | ICD-10-CM | POA: Diagnosis not present

## 2018-11-29 DIAGNOSIS — Z7389 Other problems related to life management difficulty: Secondary | ICD-10-CM | POA: Diagnosis not present

## 2018-11-29 DIAGNOSIS — Z9181 History of falling: Secondary | ICD-10-CM | POA: Diagnosis not present

## 2018-11-29 DIAGNOSIS — R2681 Unsteadiness on feet: Secondary | ICD-10-CM | POA: Diagnosis not present

## 2018-11-29 DIAGNOSIS — R29898 Other symptoms and signs involving the musculoskeletal system: Secondary | ICD-10-CM | POA: Diagnosis not present

## 2018-12-02 ENCOUNTER — Encounter: Payer: Self-pay | Admitting: Family

## 2018-12-02 ENCOUNTER — Non-Acute Institutional Stay (SKILLED_NURSING_FACILITY): Payer: Medicare Other | Admitting: Family

## 2018-12-02 DIAGNOSIS — R197 Diarrhea, unspecified: Secondary | ICD-10-CM | POA: Diagnosis not present

## 2018-12-02 DIAGNOSIS — M6281 Muscle weakness (generalized): Secondary | ICD-10-CM | POA: Diagnosis not present

## 2018-12-02 DIAGNOSIS — R41841 Cognitive communication deficit: Secondary | ICD-10-CM | POA: Diagnosis not present

## 2018-12-02 DIAGNOSIS — R2681 Unsteadiness on feet: Secondary | ICD-10-CM | POA: Diagnosis not present

## 2018-12-02 DIAGNOSIS — R29898 Other symptoms and signs involving the musculoskeletal system: Secondary | ICD-10-CM | POA: Diagnosis not present

## 2018-12-02 DIAGNOSIS — S8001XS Contusion of right knee, sequela: Secondary | ICD-10-CM | POA: Diagnosis not present

## 2018-12-02 DIAGNOSIS — Z9181 History of falling: Secondary | ICD-10-CM | POA: Diagnosis not present

## 2018-12-02 DIAGNOSIS — Z7389 Other problems related to life management difficulty: Secondary | ICD-10-CM | POA: Diagnosis not present

## 2018-12-02 NOTE — Progress Notes (Signed)
Location:  [place] Room Number: [place] of Service:  SNF [phone]) Provider: Khalilah Hoke FNP-C  Virgie Dad, MD  Patient Care Team: Virgie Dad, MD as PCP - General (Internal Medicine) Minus Breeding, MD as PCP - Cardiology (Cardiology) Azucena Fallen, MD as Consulting Physician (Obstetrics and Gynecology) Chesley Mires, MD as Consulting Physician (Pulmonary Disease) Mast, Man X, NP as Nurse Practitioner (Internal Medicine) Christin Fudge, MD as Consulting Physician (Surgery)  Extended Emergency Contact Information Primary Emergency Contact: Boxman,Lee Address: [address]          [place], [place] [address] Johnnette Litter of [place] Phone: [phone] Mobile Phone: [phone] Relation: Daughter Secondary Emergency Contact: Ritta Slot [place] of [place] Mobile Phone: [phone] Relation: Daughter  Code Status:  DNR Goals of care: Advanced Directive information Advanced Directives 12/02/2018  Does Patient Have a Medical Advance Directive? Yes  Type of Advance Directive [place]  Does patient want to make changes to medical advance directive? No - Patient declined  Copy of [place] in Chart? Yes - validated most recent copy scanned in chart (See row information)  Would patient like information on creating a medical advance directive? -  Pre-existing out of facility DNR order (yellow form or pink MOST form) -     Chief Complaint  Patient presents with   Acute Visit    Abnormal Labs     HPI:  Pt is a 85 y.o. female seen today at [facility] for an acute visit for evaluation of abnormal lab results.she is seen in her room today.she denies any new  acute issues other than ongoing diarrhea 3-4 times a day sometimes.she states has an upcoming appointment with the GI specialist.she denies any blood in the stool,fever,chills or abdominal pain.Appetite described as good and tries to  drink through out the day.she takes imodium whenever she has diarhea. Her recent BMP lab 11/28/2018 reviewed electrolytes and renal function was normal except had low calcium level 8.0 she is not on any calcium supplement.does have hx of falls but no fractures.she states her left knee swelling from previous fall has improved but still has small hematoma to right knee.No pain on the swelling.         Past Medical History:  Diagnosis Date   ARTHRITIS, KNEES, BILATERAL    Breast CA ([place]) 2000   s/p R lumpectomy and XRT   CHF (congestive heart failure) (HCC)    DEPRESSION    DJD (degenerative joint disease) of knee    GERD    HYPERLIPIDEMIA    HYPERTENSION    HYPOTHYROIDISM    postsurgical   Hypoxia    MIGRAINE HEADACHE    Morbid obesity (HCC)    OSA (obstructive sleep apnea) 01/17/2011 dx   Oxygen dependent    Urge incontinence    UTI (lower urinary tract infection) 12/2014   Past Surgical History:  Procedure Laterality Date   ABDOMINAL HYSTERECTOMY     ADENOIDECTOMY     BREAST BIOPSY  2000   BREAST LUMPECTOMY  2000   CATARACT EXTRACTION     CHOLECYSTECTOMY  1995   CYSTOSCOPY/URETEROSCOPY/HOLMIUM LASER Right 06/28/2015   Procedure: CYSTOSCOPY RIGHT RETROGRAD RIGHT URETEROSCOPY/HOLMIUM LASER WITH RIGHT STENT PLACEMENT;  Surgeon: Alexis Frock, MD;  Location: [facility];  Service: Urology;  Laterality: Right;   PARATHYROIDECTOMY     2-3 removed   REPLACEMENT TOTAL KNEE BILATERAL  1999, 2005   Right leg femur repaired  THYROIDECTOMY     TONSILLECTOMY     TUBAL LIGATION      Allergies  Allergen Reactions   Azithromycin Itching   Beta Adrenergic Blockers Other (See Comments)    Depression    Ciprofloxacin Other (See Comments)    Edgy and very jumpy     Codeine Nausea And Vomiting   Darvon [Propoxyphene] Nausea And Vomiting   Epinephrine Other (See Comments)    Rapid pulse, sweats   Klonopin [Clonazepam] Other (See Comments)    Makes her  feel very suicidal    Oxycodone Other (See Comments)    Patient felt like it altered her mental status "Crazy" . Hallucinations later as well.   Paxil [Paroxetine Hydrochloride] Other (See Comments)    Severe depression    Prednisone Other (See Comments)    "makes me feel really bad"   Propoxyphene Hcl Nausea And Vomiting   Tape Itching   Torsemide Other (See Comments)    Patient stated that she doesn't recall the reaction   Tramadol Other (See Comments)    Feels "jittery"   Vicodin [Hydrocodone-Acetaminophen] Other (See Comments)    Hallucinations   Meloxicam Diarrhea   Other Rash    Polyester sheets "cause me a rash"   Vancomycin Rash    Localized rash related to infusion rate    Outpatient Encounter Medications as of 12/02/2018  Medication Sig   acetaminophen (TYLENOL) 325 MG tablet Take 650 mg by mouth every 8 (eight) hours as needed for mild pain or moderate pain.    aspirin 81 MG tablet Take 81 mg by mouth daily.    cetirizine (ZYRTEC) 10 MG chewable tablet Chew 10 mg by mouth daily as needed for allergies.    cyanocobalamin 2000 MCG tablet Take 2,000 mcg by mouth daily.   levothyroxine (SYNTHROID) 150 MCG tablet Take 150 mcg by mouth daily before breakfast. Patient is to use name brand medication (Synthroid only)   loperamide (IMODIUM A-D) 2 MG tablet Take 4 mg by mouth as needed for diarrhea or loose stools. Check rectum for retained stool. If >3 loose stools in 24 hrs., hold all lax. and stool softeners. Give Imodium AD (loperamide) 4 mg po 1st dose, then 2 mg po after each loose stool X 48 hrs. Not to exceed $RemoveB'16mg'HphGWBTW$  in 24 hrs. If not effective, notify MD. Do n   Melatonin 5 MG TABS Take 5 mg by mouth at bedtime.   metolazone (ZAROXOLYN) 2.5 MG tablet Take 2.5 mg by mouth daily.   omeprazole (PRILOSEC) 20 MG capsule Take 20 mg by mouth 2 (two) times a day.    ondansetron (ZOFRAN) 4 MG tablet TAKE (1) TABLET BY MOUTH EVERY SIX HOURS AS NEEDED FOR NAUSEA.    OXYGEN Inhale 2 L into the lungs continuous. KEEP O2 SATS >= 90%. Every Shift   polyvinyl alcohol (LIQUIFILM TEARS) 1.4 % ophthalmic solution Place 2 drops into both eyes 4 (four) times daily.   potassium chloride (MICRO-K) 10 MEQ CR capsule Take 60 mEq by mouth daily. Take 6 capsules   QUEtiapine (SEROQUEL) 25 MG tablet Take 12.5 mg by mouth at bedtime.   saccharomyces boulardii (FLORASTOR) 250 MG capsule Take 250 mg by mouth daily.   sertraline (ZOLOFT) 100 MG tablet Take 100 mg by mouth daily.   spironolactone (ALDACTONE) 25 MG tablet Take 25 mg by mouth daily.   No facility-administered encounter medications on file as of 12/02/2018.     Review of Systems  Constitutional: Negative for appetite change,  chills, fatigue and fever.  HENT: Positive for hearing loss. Negative for congestion, rhinorrhea, sinus pressure, sinus pain, sneezing and sore throat.   Eyes: Negative for discharge, redness, itching and visual disturbance.  Respiratory: Negative for cough, chest tightness, shortness of breath and wheezing.        On continuous oxygen via nasal cannula   Cardiovascular: Positive for leg swelling. Negative for chest pain and palpitations.  Gastrointestinal: Negative for abdominal distention, abdominal pain, constipation, nausea and vomiting.       Reports chronic diarrhea has appointment with GI   Endocrine: Negative for cold intolerance and heat intolerance.  Genitourinary: Negative for difficulty urinating, dysuria, flank pain, frequency and urgency.  Musculoskeletal: Positive for gait problem. Negative for arthralgias.  Skin: Negative for color change, pallor and rash.  Neurological: Negative for dizziness, light-headedness and headaches.  Psychiatric/Behavioral: Negative for agitation and sleep disturbance. The patient is not nervous/anxious.     Immunization History  Administered Date(s) Administered   Influenza Split 03/20/2011   Influenza Whole 03/19/2009, 03/19/2012     Influenza, High Dose Seasonal PF 03/16/2018   Influenza,inj,Quad PF,6+ Mos 03/19/2013   Influenza-Unspecified 03/03/2014, 03/15/2015, 03/30/2016, 04/05/2017   Meningococcal Polysaccharide 03/15/2015   PPD Test 03/30/2016   Pneumococcal Conjugate-13 03/12/2014   Pneumococcal Polysaccharide-23 06/19/2006, 03/15/2015   Td 10/04/2009   Tdap 06/19/2005, 11/14/2018   Zoster 06/19/2010   Zoster Recombinat (Shingrix) 01/28/2018, 04/22/2018, 05/22/2018   Pertinent  Health Maintenance Due  Topic Date Due   FOOT EXAM  09/08/1940   OPHTHALMOLOGY EXAM  09/08/1940   URINE MICROALBUMIN  09/08/1940   DEXA SCAN  09/07/1997   HEMOGLOBIN A1C  11/19/2018   INFLUENZA VACCINE  01/18/2019   PNA vac Low Risk Adult  Completed   Fall Risk  08/30/2017 06/07/2017 12/22/2016 04/06/2016 11/27/2015  Falls in the past year? No No Yes Yes No  Number falls in past yr: - - 1 1 -  Injury with Fall? - - No Yes -  Risk for fall due to : - - - - -    Vitals:   12/02/18 1610  BP: (!) 152/78  Pulse: 89  Resp: 20  Temp: (!) 97.3 F (36.3 C)  TempSrc: Oral  SpO2: 93%  Weight: 274 lb 12.8 oz (124.6 kg)  Height: $Remove'5\' 2"'JlHuyAD$  (1.575 m)   Body mass index is 50.26 kg/m. Physical Exam Vitals signs reviewed.  Constitutional:      General: She is not in acute distress.    Appearance: She is obese. She is not ill-appearing.  HENT:     Head: Normocephalic.     Nose: No congestion or rhinorrhea.     Mouth/Throat:     Mouth: Mucous membranes are moist.     Pharynx: Oropharynx is clear. No oropharyngeal exudate or posterior oropharyngeal erythema.  Eyes:     General: No scleral icterus.       Right eye: No discharge.        Left eye: No discharge.     Conjunctiva/sclera: Conjunctivae normal.     Pupils: Pupils are equal, round, and reactive to light.  Cardiovascular:     Rate and Rhythm: Normal rate and regular rhythm.     Pulses: Normal pulses.     Heart sounds: Normal heart sounds. No murmur. No  friction rub. No gallop.   Pulmonary:     Effort: Pulmonary effort is normal. No respiratory distress.     Breath sounds: Normal breath sounds. No wheezing, rhonchi or rales.  Comments: Oxygen via nasal cannula in place  Chest:     Chest wall: No tenderness.  Abdominal:     General: Bowel sounds are normal. There is no distension.     Palpations: Abdomen is soft. There is no mass.     Tenderness: There is no abdominal tenderness. There is no right CVA tenderness, left CVA tenderness, guarding or rebound.  Musculoskeletal:     Comments: Unsteady gait ambulates with walker.Bilateral lower extremities trace edema.right knee small hematoma non tender to touch.  Skin:    General: Skin is warm and dry.     Coloration: Skin is not pale.     Findings: No erythema or rash.  Neurological:     Mental Status: She is alert and oriented to person, place, and time.     Cranial Nerves: No cranial nerve deficit.     Sensory: No sensory deficit.     Gait: Gait abnormal.  Psychiatric:        Mood and Affect: Mood normal.        Behavior: Behavior normal.        Thought Content: Thought content normal.        Judgment: Judgment normal.     Labs reviewed: Recent Labs    01/01/18 0715 01/10/18 0000  05/20/18 0000  08/09/18 0533 08/10/18 0535 10/30/18 11/06/18 1318 11/28/18  NA 142 141   < > 139   < > 138 139 143 140 141  K 4.0 3.7   < > 3.6   < > 3.8 4.1 3.9 3.9 3.6  CL 100 99   < > 100   < > 99 100  --  100  --   CO2 36* 33*   < > 31   < > 30 31  --  32  --   GLUCOSE 115* 86   < > 114*   < > 133* 103*  --  95  --   BUN 17 22   < > 21   < > $R'23 10 13 'Vx$ 7* 14  CREATININE 0.86 0.89*   < > 0.92*   < > 0.97 0.75 0.9 0.99 0.9  CALCIUM 8.0* 8.1*   < > 8.1*   < > 8.1* 7.5*  --  7.9*  --   MG 1.8 1.7  --  1.6  --   --   --   --   --   --    < > = values in this interval not displayed.   Recent Labs    04/23/18 1519 05/20/18 0000 08/10/18 0535 10/30/18 11/06/18 1318  AST $Re'20 16 21 14 'ZZF$ 13*    ALT $Re'12 11 11 8 10  'Lwz$ ALKPHOS 84  --  79 78 69  BILITOT 0.7 0.5 0.9  --  0.6  PROT 6.6 6.4 5.7*  --  5.7*  ALBUMIN 3.0*  --  2.8*  --  2.7*   Recent Labs    07/05/18 1248  08/09/18 0533 08/10/18 0621 10/30/18 11/06/18 1318  WBC 6.3  --  7.3 5.4 7.3 6.6  NEUTROABS 4.7  --  4.6  --   --  4.7  HGB 12.9   < > 12.2 11.5* 12.6 11.7*  HCT 41.0   < > 39.1 38.6 39 37.7  MCV 92.3  --  96.3 98.2  --  92.2  PLT 142*  --  129* 133* 132* 140*   < > = values in this interval not displayed.   Lab  Results  Component Value Date   TSH 20.08 (A) 10/30/2018   Lab Results  Component Value Date   HGBA1C 6.5 (H) 05/20/2018   Lab Results  Component Value Date   CHOL 153 03/05/2018   HDL 45 (L) 03/05/2018   LDLCALC 86 03/05/2018   LDLDIRECT 99.4 03/13/2012   TRIG 127 03/05/2018   CHOLHDL 3.4 03/05/2018    Significant Diagnostic Results in last 30 days:  Ct Head Wo Contrast  Result Date: 11/14/2018 CLINICAL DATA:  Recent fall with headaches and neck pain, initial encounter EXAM: CT HEAD WITHOUT CONTRAST CT CERVICAL SPINE WITHOUT CONTRAST TECHNIQUE: Multidetector CT imaging of the head and cervical spine was performed following the standard protocol without intravenous contrast. Multiplanar CT image reconstructions of the cervical spine were also generated. COMPARISON:  03/17/2016 FINDINGS: CT HEAD FINDINGS Brain: Mild atrophic changes and chronic white matter ischemic changes are noted. No findings to suggest acute hemorrhage, acute infarction or space-occupying mass lesion are seen. Vascular: No hyperdense vessel or unexpected calcification. Skull: Normal. Negative for fracture or focal lesion. Sinuses/Orbits: No acute finding. Other: Scalp hematoma is noted in the left frontal region. CT CERVICAL SPINE FINDINGS Alignment: Within normal limits. Skull base and vertebrae: 7 cervical segments are well visualized. Disc space narrowing is noted from C4-C7 with mild associated osteophytic changes identified.  Multilevel facet hypertrophic changes are seen. No acute fracture or dislocation is noted. The odontoid is within normal limits. Soft tissues and spinal canal: Surrounding soft tissue structures are within normal limits. Upper chest: Visualized lung apices are unremarkable. Other: None IMPRESSION: CT of the head: Chronic atrophic and ischemic changes. Left scalp hematoma related to the recent injury. CT of the cervical spine: Multilevel degenerative change without acute abnormality. Electronically Signed   By: Inez Catalina M.D.   On: 11/14/2018 01:47   Ct Cervical Spine Wo Contrast  Result Date: 11/14/2018 CLINICAL DATA:  Recent fall with headaches and neck pain, initial encounter EXAM: CT HEAD WITHOUT CONTRAST CT CERVICAL SPINE WITHOUT CONTRAST TECHNIQUE: Multidetector CT imaging of the head and cervical spine was performed following the standard protocol without intravenous contrast. Multiplanar CT image reconstructions of the cervical spine were also generated. COMPARISON:  03/17/2016 FINDINGS: CT HEAD FINDINGS Brain: Mild atrophic changes and chronic white matter ischemic changes are noted. No findings to suggest acute hemorrhage, acute infarction or space-occupying mass lesion are seen. Vascular: No hyperdense vessel or unexpected calcification. Skull: Normal. Negative for fracture or focal lesion. Sinuses/Orbits: No acute finding. Other: Scalp hematoma is noted in the left frontal region. CT CERVICAL SPINE FINDINGS Alignment: Within normal limits. Skull base and vertebrae: 7 cervical segments are well visualized. Disc space narrowing is noted from C4-C7 with mild associated osteophytic changes identified. Multilevel facet hypertrophic changes are seen. No acute fracture or dislocation is noted. The odontoid is within normal limits. Soft tissues and spinal canal: Surrounding soft tissue structures are within normal limits. Upper chest: Visualized lung apices are unremarkable. Other: None IMPRESSION: CT of  the head: Chronic atrophic and ischemic changes. Left scalp hematoma related to the recent injury. CT of the cervical spine: Multilevel degenerative change without acute abnormality. Electronically Signed   By: Inez Catalina M.D.   On: 11/14/2018 01:47   Dg Chest Portable 1 View  Result Date: 11/06/2018 CLINICAL DATA:  Shortness of breath and cough for 1 month. EXAM: PORTABLE CHEST 1 VIEW COMPARISON:  Chest x-ray August 09, 2018 FINDINGS: The heart size and mediastinal contours are stable. The heart size  is enlarged. Both lungs are clear. The visualized skeletal structures are unremarkable. IMPRESSION: No active cardiopulmonary disease. Electronically Signed   By: Abelardo Diesel M.D.   On: 11/06/2018 13:42   Dg Knee Complete 4 Views Left  Result Date: 11/14/2018 CLINICAL DATA:  Left knee pain, initial encounter EXAM: LEFT KNEE - COMPLETE 4+ VIEW COMPARISON:  None. FINDINGS: Left knee prosthesis is noted. No acute fracture or loosening is seen. No soft tissue abnormality is noted. IMPRESSION: Left knee prosthesis without acute abnormality. Electronically Signed   By: Inez Catalina M.D.   On: 11/14/2018 01:41   Dg Knee Complete 4 Views Right  Result Date: 11/14/2018 CLINICAL DATA:  Right knee pain EXAM: RIGHT KNEE - COMPLETE 4+ VIEW COMPARISON:  03/17/2016 FINDINGS: Right knee prosthesis is again identified. Medullary rod is noted within the femur as well as changes of prior distal fracture with some degree of healing. The overall appearance is stable. No new focal abnormality is seen. IMPRESSION: Right knee prosthesis as well as medullary rod fixation of the right femur. No acute abnormality is noted. Electronically Signed   By: Inez Catalina M.D.   On: 11/14/2018 01:40    Assessment/Plan  1. Hypocalcemia Ca 8.0 (11/28/2018).start on calcium citrates/vit D 200 mg tablet one by mouth daily.encouraged to eat all her three meals.continue to monitor.   2. Right Knee Hematoma   Afebrile.Has improved per  patient.Non tender to touch.continue to monitor.   3. Chronic Diarrhea  Afebrile.Appetite is good and no loss of weight.Negative exam findings.continue on imodium as needed.follow up with GI as directed.   Family/ staff Communication: Reviewed plan of care with patient and facility Nurse.   Labs/tests ordered: None   Guinn Delarosa C Starkeisha Vanwinkle, NP

## 2018-12-03 DIAGNOSIS — Z9181 History of falling: Secondary | ICD-10-CM | POA: Diagnosis not present

## 2018-12-03 DIAGNOSIS — M6281 Muscle weakness (generalized): Secondary | ICD-10-CM | POA: Diagnosis not present

## 2018-12-03 DIAGNOSIS — Z7389 Other problems related to life management difficulty: Secondary | ICD-10-CM | POA: Diagnosis not present

## 2018-12-03 DIAGNOSIS — R2681 Unsteadiness on feet: Secondary | ICD-10-CM | POA: Diagnosis not present

## 2018-12-03 DIAGNOSIS — R29898 Other symptoms and signs involving the musculoskeletal system: Secondary | ICD-10-CM | POA: Diagnosis not present

## 2018-12-03 DIAGNOSIS — R41841 Cognitive communication deficit: Secondary | ICD-10-CM | POA: Diagnosis not present

## 2018-12-04 DIAGNOSIS — Z7389 Other problems related to life management difficulty: Secondary | ICD-10-CM | POA: Diagnosis not present

## 2018-12-04 DIAGNOSIS — R29898 Other symptoms and signs involving the musculoskeletal system: Secondary | ICD-10-CM | POA: Diagnosis not present

## 2018-12-04 DIAGNOSIS — Z9181 History of falling: Secondary | ICD-10-CM | POA: Diagnosis not present

## 2018-12-04 DIAGNOSIS — R2681 Unsteadiness on feet: Secondary | ICD-10-CM | POA: Diagnosis not present

## 2018-12-04 DIAGNOSIS — M6281 Muscle weakness (generalized): Secondary | ICD-10-CM | POA: Diagnosis not present

## 2018-12-04 DIAGNOSIS — R41841 Cognitive communication deficit: Secondary | ICD-10-CM | POA: Diagnosis not present

## 2018-12-05 ENCOUNTER — Ambulatory Visit (INDEPENDENT_AMBULATORY_CARE_PROVIDER_SITE_OTHER): Payer: Medicare Other | Admitting: Physician Assistant

## 2018-12-05 ENCOUNTER — Other Ambulatory Visit (INDEPENDENT_AMBULATORY_CARE_PROVIDER_SITE_OTHER): Payer: Medicare Other

## 2018-12-05 ENCOUNTER — Encounter: Payer: Self-pay | Admitting: Physician Assistant

## 2018-12-05 VITALS — BP 124/62 | HR 60 | Temp 98.3°F | Ht 66.0 in | Wt 275.0 lb

## 2018-12-05 DIAGNOSIS — R29898 Other symptoms and signs involving the musculoskeletal system: Secondary | ICD-10-CM | POA: Diagnosis not present

## 2018-12-05 DIAGNOSIS — R2681 Unsteadiness on feet: Secondary | ICD-10-CM | POA: Diagnosis not present

## 2018-12-05 DIAGNOSIS — K529 Noninfective gastroenteritis and colitis, unspecified: Secondary | ICD-10-CM | POA: Diagnosis not present

## 2018-12-05 DIAGNOSIS — M6281 Muscle weakness (generalized): Secondary | ICD-10-CM | POA: Diagnosis not present

## 2018-12-05 DIAGNOSIS — Z9181 History of falling: Secondary | ICD-10-CM | POA: Diagnosis not present

## 2018-12-05 DIAGNOSIS — R41841 Cognitive communication deficit: Secondary | ICD-10-CM | POA: Diagnosis not present

## 2018-12-05 DIAGNOSIS — Z7389 Other problems related to life management difficulty: Secondary | ICD-10-CM | POA: Diagnosis not present

## 2018-12-05 LAB — HIGH SENSITIVITY CRP: CRP, High Sensitivity: 22.22 mg/L — ABNORMAL HIGH (ref 0.000–5.000)

## 2018-12-05 LAB — SEDIMENTATION RATE: Sed Rate: 79 mm/hr — ABNORMAL HIGH (ref 0–30)

## 2018-12-05 LAB — IGA: IgA: 318 mg/dL (ref 68–378)

## 2018-12-05 MED ORDER — CHOLESTYRAMINE 4 G PO PACK: 4.0000 g | PACK | Freq: Every day | ORAL | 8 refills | Status: DC

## 2018-12-05 NOTE — Patient Instructions (Addendum)
If you are age 83 or older, your body mass index should be between 23-30. Your Body mass index is 44.39 kg/m. If this is out of the aforementioned range listed, please consider follow up with your Primary Care Provider.  If you are age 31 or younger, your body mass index should be between 19-25. Your Body mass index is 44.39 kg/m. If this is out of the aformentioned range listed, please consider follow up with your Primary Care Provider.   Your provider has requested that you go to the basement level for lab work before leaving today. Press "B" on the elevator. The lab is located at the first door on the left as you exit the elevator.  Nursing Facility - patient will need assistance with obtaining stool for stool studies!  Start Imodium 2 p.o. q am. Before breakfast.  Start Questran 4 gm in juice or water once daily in the morning.  Take 2 hours away from other medications.  A prescription has been enclosed.  We will call with results.  Thank you for choosing me and [facility].   Amy Esterwood, PA-C

## 2018-12-05 NOTE — Progress Notes (Signed)
Subjective:    Patient ID: Tamara Morales, female    DOB: 1931/06/01, 85 y.o.   MRN: [id number]  HPI Tamara Morales is a very nice 85 year old white female, new to GI today.  She is referred by Dr. K[facility] for evaluation of chronic diarrhea.  Patient is a resident of [facility]. Patient has history of obesity hypoventilation syndrome with hypoxia and is chronic oxygen dependent, has chronic congestive heart failure, hypertension, hypothyroidism, chronic kidney disease, she is status post thyroidectomy and parathyroidectomy, adenoidectomy cholecystectomy and hysterectomy. She says she has had prior GI evaluation but nothing in greater than 10 years.  There is a copy of an endoscopy report in her chart from [facility] from 2009.  This was done for dyspepsia and was negative exam.  No colonoscopy records in epic and patient says she knows she had a colonoscopy remotely but that may have been 15 or 20 years ago. She says she has had diarrhea symptoms over the past 3 to 5 years, somewhat worse over the past several months.  She is distressed by having intermittent incontinence and unpredictable episodes.  Generally she is having 3-4 bowel movements per day usually after breakfast and after dinner.  She has no associated abdominal pain bloating distention or cramping.  Appetite is fine and her weight has been stable.  She is unaware of any food triggers and says it does not seem to matter what she eats or drinks and she has not had any formed stools over the past couple of years.  She denies any melena or hematochezia.  In the past Imodium would control her symptoms but over the past several months does not seem to be working.  She also mentions that very remotely she had been on Lomotil at one point and She is not aware of being given any specific diagnosis for her diarrhea. She is on omeprazole 20 mg twice daily chronically for GERD, and record shows she was just started on  Florastor twice daily. Labs from May 2020 showed TSH of 20.0 hemoglobin 11.7 hematocrit of 37 platelets 140 MCV of 92  Review of Systems Pertinent positive and negative review of systems were noted in the above HPI section.  All other review of systems was otherwise negative.  Outpatient Encounter Medications as of 12/05/2018  Medication Sig  . acetaminophen (TYLENOL) 325 MG tablet Take 650 mg by mouth every 8 (eight) hours as needed for mild pain or moderate pain.   [name] aspirin 81 MG tablet Take 81 mg by mouth daily.   . cetirizine (ZYRTEC) 10 MG chewable tablet Chew 10 mg by mouth daily as needed for allergies.   . cyanocobalamin 2000 MCG tablet Take 2,000 mcg by mouth daily.  [name] levothyroxine (SYNTHROID) 150 MCG tablet Take 150 mcg by mouth daily before breakfast. Patient is to use name brand medication (Synthroid only)  . Melatonin 5 MG TABS Take 5 mg by mouth at bedtime.  . metolazone (ZAROXOLYN) 2.5 MG tablet Take 2.5 mg by mouth daily.  [name] omeprazole (PRILOSEC) 20 MG capsule Take 20 mg by mouth 2 (two) times a day.   . ondansetron (ZOFRAN) 4 MG tablet TAKE (1) TABLET BY MOUTH EVERY SIX HOURS AS NEEDED FOR NAUSEA.  [name] OXYGEN Inhale 2 L into the lungs continuous. KEEP O2 SATS >= 90%. Every Shift  . polyvinyl alcohol (LIQUIFILM TEARS) 1.4 % ophthalmic solution Place 2 drops into both eyes 4 (four) times daily.  . potassium chloride (MICRO-K) 10  MEQ CR capsule Take 60 mEq by mouth daily. Take 6 capsules  . QUEtiapine (SEROQUEL) 25 MG tablet Take 12.5 mg by mouth at bedtime.  . saccharomyces boulardii (FLORASTOR) 250 MG capsule Take 250 mg by mouth daily.  . sertraline (ZOLOFT) 100 MG tablet Take 100 mg by mouth daily.  [name] spironolactone (ALDACTONE) 25 MG tablet Take 25 mg by mouth daily.  . cholestyramine (QUESTRAN) 4 g packet Take 1 packet (4 g total) by mouth daily. Mix in juice or water once daily in the morning 2 hours away from any other medications   No facility-administered encounter  medications on file as of 12/05/2018.    Allergies  Allergen Reactions  . Azithromycin Itching  . Beta Adrenergic Blockers Other (See Comments)    Depression   . Ciprofloxacin Other (See Comments)    Edgy and very jumpy    . Codeine Nausea And Vomiting  . Darvon [Propoxyphene] Nausea And Vomiting  . Epinephrine Other (See Comments)    Rapid pulse, sweats  . Klonopin [Clonazepam] Other (See Comments)    Makes her feel very suicidal   . Oxycodone Other (See Comments)    Patient felt like it altered her mental status "Crazy" . Hallucinations later as well.  [name] Paxil [Paroxetine Hydrochloride] Other (See Comments)    Severe depression   . Prednisone Other (See Comments)    "makes me feel really bad"  . Propoxyphene Hcl Nausea And Vomiting  . Tape Itching  . Torsemide Other (See Comments)    Patient stated that she doesn't recall the reaction  . Tramadol Other (See Comments)    Feels "jittery"  . Vicodin [Hydrocodone-Acetaminophen] Other (See Comments)    Hallucinations  . Meloxicam Diarrhea  . Other Rash    Polyester sheets "cause me a rash"  . Vancomycin Rash    Localized rash related to infusion rate   Patient Active Problem List   Diagnosis Date Noted  . Hypocalcemia 12/02/2018  . Laceration of skin of forehead 11/15/2018  . Fall 11/15/2018  . Gait abnormality 11/15/2018  . Hallucination, visual 11/14/2018  . Confusion 11/10/2018  . Generalized weakness 11/06/2018  . Chronic diastolic CHF (congestive heart failure) ([place]) 10/29/2018  . Vitamin B12 deficiency 05/23/2018  . Anxiety 04/23/2018  . Chronic respiratory failure with hypoxia ([place]) 04/23/2018  . Acute on chronic diastolic (congestive) heart failure ([place]) 03/14/2018  . Diabetes mellitus type 2 in obese ([place]) 03/12/2018  . Lymphedema 11/20/2017  . Counseling regarding advanced care planning and goals of care 09/20/2017  . Hypomagnesemia 07/17/2017  . Prediabetes 07/03/2017  . Hyperlipidemia LDL goal <70  07/03/2017  . Post-nasal drip 06/27/2017  . CKD (chronic kidney disease) stage 3, GFR 30-59 ml/min ([facility]) 05/22/2017  . Bilateral dry eyes 04/20/2017  . Hemorrhoids 04/09/2017  . Chronic constipation 03/30/2017  . Weight gain 12/07/2016  . Neck pain on left side 10/05/2016  . Chronic left shoulder pain 08/10/2016  . Hematuria 08/10/2016  . Numbness and tingling of right leg 04/06/2016  . IBS (irritable bowel syndrome) 11/18/2015  . Hypokalemia 07/07/2015  . Acid reflux 07/05/2015  . UTI (urinary tract infection) 07/02/2015  . Ureterolithiasis   . Depression with anxiety 06/27/2015  . History of breast cancer 06/27/2015  . Obesity hypoventilation syndrome ([place]) 11/27/2014  . Diarrhea 08/21/2013  . Dyspnea 03/13/2013  . Cough 03/05/2010  . Morbid obesity ([place]) 10/04/2009  . Hypothyroidism (acquired) 08/05/2009  . Essential hypertension 08/05/2009   Social History   Socioeconomic History  .  Marital status: Married    Spouse name: Not on file  . Number of children: 5  . Years of education: Not on file  . Highest education level: Not on file  Occupational History  . Occupation: RETIRED    Employer: RETIRED    Comment: worked on Hotel manager  . Financial resource strain: Not hard at all  . Food insecurity    Worry: Never true    Inability: Never true  . Transportation needs    Medical: No    Non-medical: No  Tobacco Use  . Smoking status: Former Smoker    Packs/day: 0.25    Years: 2.00    Pack years: 0.50    Types: Cigarettes    Quit date: 06/19/1974    Years since quitting: 44.4  . Smokeless tobacco: Never Used  . Tobacco comment: Quit in late 20's  Substance and Sexual Activity  . Alcohol use: No    Alcohol/week: 0.0 standard drinks    Comment: rare  . Drug use: No  . Sexual activity: Never  Lifestyle  . Physical activity    Days per week: 0 days    Minutes per session: 0 min  . Stress: Only a little  Relationships  . Social connections    Talks  on phone: More than three times a week    Gets together: Once a week    Attends religious service: More than 4 times per year    Active member of club or organization: Yes    Attends meetings of clubs or organizations: 1 to 4 times per year    Relationship status: Married  . Intimate partner violence    Fear of current or ex partner: No    Emotionally abused: No    Physically abused: No    Forced sexual activity: No  Other Topics Concern  . Not on file  Social History Narrative   Married to husband that has dementia (he lives in the skilled nursing wing) and this is a big stressor. 5 children. 10 grandchildren. 2 greatgrandchildren. All children [place]      Lives at [facility], in independent living.      Retired from multiple different Community education officer, university, Psychologist, educational.       Hobbies: Management consultant, write poetry, craft dolls    Ms. Bear's family history includes Emphysema in her sister.      Objective:    Vitals:   12/05/18 1103  BP: 124/62  Pulse: 60  Temp: 98.3 F (36.8 C)    Physical Exam; well-developed elderly white female, nursing home resident who comes in in a wheelchair, and is on chronic O2 at 2 L.  BMI 44.3, height 5 foot 6, weight 275   HEENT; nontraumatic normocephalic, EOMI, PE R LA, sclera anicteric.  Nasal O2 Oropharynx; not examined, wearing mask/COVID Neck; supple, no JVD Cardiovascular; regular rate and rhythm with S1-S2, no murmur rub or gallop Pulmonary; Clear bilaterally, somewhat decreased bilateral bases Abdomen; obese soft, nontender, nondistended, no palpable mass or hepatosplenomegaly, bowel sounds are active Rectal; not done Skin; benign exam, no jaundice rash or appreciable lesions Extremities; no clubbing cyanosis or edema skin warm and dry Neuro/Psych; alert and oriented x4, grossly nonfocal mood and affect appropriate       Assessment & Plan:   #63 85 year old female with chronic diarrhea over the past 3 to 5 years  somewhat progressive over the past year with episodes of incontinence and unpredictability. No associated abdominal discomfort  bloating nausea etc. and no melena or hematochezia.  Etiology of symptoms is not clear.  She does not have any alarm symptoms.  Rule out IBS D, rule out medication induced, rule out postcholecystectomy bile salt induced, rule out possible microscopic colitis.  Intermittent incontinence is complicating management.  #2 Limited mobility #3 oxygen dependent/obesity hypoventilation syndrome #4.  Congestive heart failure #5.  Chronic kidney disease #6.  Status post cholecystectomy and hysterectomy # 7Status post thyroidectomy and parathyroidectomy  Plan; continue Florastor 1 p.o. twice daily. We will start a trial of cholestyramine 4 g once daily in a.m. at least 2 hours away from any other medications, this can be titrated to twice daily if needed. Start taking Imodium on a scheduled basis 2 p.o. every morning prior to breakfast Check sed rate, CRP TTG and IgA. GI pathogen panel, stool for lactoferrin and fecal elastase. Patient is a poor colonoscopy candidate, and procedure would need to be scheduled at the hospital.  We will hope to avoid colonoscopy if possible however above studies are negative and symptoms or not significantly improved will need colonoscopy Dr. Silverio Decamp  to rule out chronic colitis or microscopic colitis.  Amy Genia Harold PA-C 12/05/2018   Cc: Virgie Dad, MD

## 2018-12-06 ENCOUNTER — Encounter: Payer: Self-pay | Admitting: Nurse Practitioner

## 2018-12-06 ENCOUNTER — Non-Acute Institutional Stay (SKILLED_NURSING_FACILITY): Payer: Medicare Other | Admitting: Nurse Practitioner

## 2018-12-06 DIAGNOSIS — J302 Other seasonal allergic rhinitis: Secondary | ICD-10-CM | POA: Diagnosis not present

## 2018-12-06 DIAGNOSIS — M25572 Pain in left ankle and joints of left foot: Secondary | ICD-10-CM | POA: Diagnosis not present

## 2018-12-06 DIAGNOSIS — R197 Diarrhea, unspecified: Secondary | ICD-10-CM | POA: Diagnosis not present

## 2018-12-06 DIAGNOSIS — R2681 Unsteadiness on feet: Secondary | ICD-10-CM | POA: Diagnosis not present

## 2018-12-06 DIAGNOSIS — R41841 Cognitive communication deficit: Secondary | ICD-10-CM | POA: Diagnosis not present

## 2018-12-06 DIAGNOSIS — E662 Morbid (severe) obesity with alveolar hypoventilation: Secondary | ICD-10-CM

## 2018-12-06 DIAGNOSIS — J309 Allergic rhinitis, unspecified: Secondary | ICD-10-CM | POA: Insufficient documentation

## 2018-12-06 DIAGNOSIS — Z7389 Other problems related to life management difficulty: Secondary | ICD-10-CM | POA: Diagnosis not present

## 2018-12-06 DIAGNOSIS — Z9181 History of falling: Secondary | ICD-10-CM | POA: Diagnosis not present

## 2018-12-06 DIAGNOSIS — M6281 Muscle weakness (generalized): Secondary | ICD-10-CM | POA: Diagnosis not present

## 2018-12-06 DIAGNOSIS — R29898 Other symptoms and signs involving the musculoskeletal system: Secondary | ICD-10-CM | POA: Diagnosis not present

## 2018-12-06 LAB — TISSUE TRANSGLUTAMINASE, IGA: (tTG) Ab, IgA: 1 U/mL

## 2018-12-06 NOTE — Progress Notes (Signed)
Location:   SNF [place] Room Number: 45/A Place of Service:  SNF (31) Provider: [facility] Hendrick Pavich NP  Virgie Dad, MD  Patient Care Team: Virgie Dad, MD as PCP - General (Internal Medicine) Minus Breeding, MD as PCP - Cardiology (Cardiology) Azucena Fallen, MD as Consulting Physician (Obstetrics and Gynecology) Chesley Mires, MD as Consulting Physician (Pulmonary Disease) Elizabeht Suto X, NP as Nurse Practitioner (Internal Medicine) Christin Fudge, MD as Consulting Physician (Surgery)  Extended Emergency Contact Information Primary Emergency Contact: Boxman,Lee Address: [address]          [place], [place] [address] Johnnette Litter of [place] Phone: [phone] Mobile Phone: [phone] Relation: Daughter Secondary Emergency Contact: Ritta Slot [place] of [place] Mobile Phone: [phone] Relation: Daughter  Code Status: DNR Goals of care: Advanced Directive information Advanced Directives 12/06/2018  Does Patient Have a Medical Advance Directive? Yes  Type of Advance Directive Out of facility DNR (pink MOST or yellow form);Living will;Healthcare Power of Attorney  Does patient want to make changes to medical advance directive? No - Patient declined  Copy of [place] in Chart? Yes - validated most recent copy scanned in chart (See row information)  Would patient like information on creating a medical advance directive? -  Pre-existing out of facility DNR order (yellow form or pink MOST form) Yellow form placed in chart (order not valid for inpatient use);Pink MOST form placed in chart (order not valid for inpatient use)     Chief Complaint  Patient presents with  . Acute Visit    Nasal congestion, left ankle pain     HPI:  Pt is a 85 y.o. female seen today for an acute visit for c/o nasal congestion, better after Zyrtec. She denied chest pain/pressure, palpitation, sense of impending doom, or focal weakness. Also c/o left ankle  aches, denied injury or severe pain, has been ambulating with therapy presently, she declined X-ay. GI consultation 12/05/18, she declined colonoscopy, started Imodium ac breakfast, Questran 4gm qam.   Past Medical History:  Diagnosis Date  . ARTHRITIS, KNEES, BILATERAL   . Breast CA ([place]) 2000   s/p R lumpectomy and XRT  . CHF (congestive heart failure) ([place])   . DEPRESSION   . DJD (degenerative joint disease) of knee   . GERD   . HYPERLIPIDEMIA   . HYPERTENSION   . HYPOTHYROIDISM    postsurgical  . Hypoxia   . MIGRAINE HEADACHE   . Morbid obesity ([place])   . OSA (obstructive sleep apnea) 01/17/2011 dx  . Oxygen dependent   . Urge incontinence   . UTI (lower urinary tract infection) 12/2014   Past Surgical History:  Procedure Laterality Date  . ABDOMINAL HYSTERECTOMY    . ADENOIDECTOMY    . BREAST BIOPSY  2000  . BREAST LUMPECTOMY  2000  . CATARACT EXTRACTION    . CHOLECYSTECTOMY  1995  . CYSTOSCOPY/URETEROSCOPY/HOLMIUM LASER Right 06/28/2015   Procedure: CYSTOSCOPY RIGHT RETROGRAD RIGHT URETEROSCOPY/HOLMIUM LASER WITH RIGHT STENT PLACEMENT;  Surgeon: Alexis Frock, MD;  Location: [facility];  Service: Urology;  Laterality: Right;  . PARATHYROIDECTOMY     2-3 removed  . REPLACEMENT TOTAL KNEE BILATERAL  1999, 2005  . Right leg femur repaired    . THYROIDECTOMY    . TONSILLECTOMY    . TUBAL LIGATION      Allergies  Allergen Reactions  . Azithromycin Itching  . Beta Adrenergic Blockers Other (See Comments)    Depression   . Ciprofloxacin  Other (See Comments)    Edgy and very jumpy    . Codeine Nausea And Vomiting  . Darvon [Propoxyphene] Nausea And Vomiting  . Epinephrine Other (See Comments)    Rapid pulse, sweats  . Klonopin [Clonazepam] Other (See Comments)    Makes her feel very suicidal   . Oxycodone Other (See Comments)    Patient felt like it altered her mental status "Crazy" . Hallucinations later as well.  [name] Paxil [Paroxetine Hydrochloride] Other (See  Comments)    Severe depression   . Prednisone Other (See Comments)    "makes me feel really bad"  . Propoxyphene Hcl Nausea And Vomiting  . Tape Itching  . Torsemide Other (See Comments)    Patient stated that she doesn't recall the reaction  . Tramadol Other (See Comments)    Feels "jittery"  . Vicodin [Hydrocodone-Acetaminophen] Other (See Comments)    Hallucinations  . Meloxicam Diarrhea  . Other Rash    Polyester sheets "cause me a rash"  . Vancomycin Rash    Localized rash related to infusion rate    Allergies as of 12/06/2018      Reactions   Azithromycin Itching   Beta Adrenergic Blockers Other (See Comments)   Depression   Ciprofloxacin Other (See Comments)   Edgy and very jumpy    Codeine Nausea And Vomiting   Darvon [propoxyphene] Nausea And Vomiting   Epinephrine Other (See Comments)   Rapid pulse, sweats   Klonopin [clonazepam] Other (See Comments)   Makes her feel very suicidal    Oxycodone Other (See Comments)   Patient felt like it altered her mental status "Crazy" . Hallucinations later as well.   Paxil [paroxetine Hydrochloride] Other (See Comments)   Severe depression    Prednisone Other (See Comments)   "makes me feel really bad"   Propoxyphene Hcl Nausea And Vomiting   Tape Itching   Torsemide Other (See Comments)   Patient stated that she doesn't recall the reaction   Tramadol Other (See Comments)   Feels "jittery"   Vicodin [hydrocodone-acetaminophen] Other (See Comments)   Hallucinations   Meloxicam Diarrhea   Other Rash   Polyester sheets "cause me a rash"   Vancomycin Rash   Localized rash related to infusion rate      Medication List       Accurate as of December 06, 2018  3:56 PM. If you have any questions, ask your nurse or doctor.        acetaminophen 325 MG tablet Commonly known as: TYLENOL Take 650 mg by mouth every 8 (eight) hours as needed for mild pain or moderate pain.   aspirin 81 MG tablet Take 81 mg by mouth daily.    calcium citrate-vitamin D 315-200 MG-UNIT tablet Commonly known as: CITRACAL+D Take 1 tablet by mouth daily. $RemoveBefo'200mg'uTnhjXJUZNc$  tablet daily   cetirizine 10 MG chewable tablet Commonly known as: ZYRTEC Chew 10 mg by mouth daily as needed for allergies.   cholestyramine 4 g packet Commonly known as: Questran Take 1 packet (4 g total) by mouth daily. Mix in juice or water once daily in the morning 2 hours away from any other medications   cyanocobalamin 2000 MCG tablet Take 2,000 mcg by mouth daily.   loperamide 2 MG capsule Commonly known as: IMODIUM Take 2 mg by mouth daily. Every morning before breakfast   Melatonin 5 MG Tabs Take 5 mg by mouth at bedtime.   metolazone 2.5 MG tablet Commonly known as: ZAROXOLYN Take 2.5  mg by mouth daily.   omeprazole 20 MG capsule Commonly known as: PRILOSEC Take 20 mg by mouth 2 (two) times a day.   ondansetron 4 MG tablet Commonly known as: ZOFRAN TAKE (1) TABLET BY MOUTH EVERY SIX HOURS AS NEEDED FOR NAUSEA.   OXYGEN Inhale 2 L into the lungs continuous. KEEP O2 SATS >= 90%. Every Shift   polyvinyl alcohol 1.4 % ophthalmic solution Commonly known as: LIQUIFILM TEARS Place 2 drops into both eyes 4 (four) times daily.   potassium chloride 10 MEQ CR capsule Commonly known as: MICRO-K Take 40 mEq by mouth daily. Take 4 capsules   QUEtiapine 25 MG tablet Commonly known as: SEROQUEL Take 12.5 mg by mouth at bedtime.   saccharomyces boulardii 250 MG capsule Commonly known as: FLORASTOR Take 250 mg by mouth daily.   sertraline 100 MG tablet Commonly known as: ZOLOFT Take 100 mg by mouth daily.   spironolactone 25 MG tablet Commonly known as: ALDACTONE Take 25 mg by mouth daily.   Synthroid 150 MCG tablet Generic drug: levothyroxine Take 150 mcg by mouth daily before breakfast. Patient is to use name brand medication (Synthroid only)      ROS was provided with assistance of staff Review of Systems  Constitutional: Negative for  activity change, appetite change, chills, diaphoresis, fatigue and fever.  HENT: Positive for congestion, hearing loss, postnasal drip, rhinorrhea and sinus pain. Negative for facial swelling, mouth sores, nosebleeds, sinus pressure, trouble swallowing and voice change.   Respiratory: Positive for shortness of breath. Negative for wheezing.        O2 dependent.   Cardiovascular: Positive for leg swelling. Negative for chest pain and palpitations.  Gastrointestinal: Positive for diarrhea. Negative for abdominal distention, abdominal pain, constipation, nausea and vomiting.  Genitourinary: Negative for difficulty urinating, dysuria and urgency.  Musculoskeletal: Positive for arthralgias and gait problem.  Skin: Negative for color change and pallor.  Neurological: Negative for dizziness, speech difficulty, weakness and headaches.       Memory lapses.   Psychiatric/Behavioral: Negative for agitation, behavioral problems, hallucinations and sleep disturbance. The patient is not nervous/anxious.     Immunization History  Administered Date(s) Administered  . Influenza Split 03/20/2011  . Influenza Whole 03/19/2009, 03/19/2012  . Influenza, High Dose Seasonal PF 03/16/2018  . Influenza,inj,Quad PF,6+ Mos 03/19/2013  . Influenza-Unspecified 03/03/2014, 03/15/2015, 03/30/2016, 04/05/2017  . Meningococcal Polysaccharide 03/15/2015  . PPD Test 03/30/2016  . Pneumococcal Conjugate-13 03/12/2014  . Pneumococcal Polysaccharide-23 06/19/2006, 03/15/2015  . Td 10/04/2009  . Tdap 06/19/2005, 11/14/2018  . Zoster 06/19/2010  . Zoster Recombinat (Shingrix) 01/28/2018, 04/22/2018, 05/22/2018   Pertinent  Health Maintenance Due  Topic Date Due  . FOOT EXAM  09/08/1940  . OPHTHALMOLOGY EXAM  09/08/1940  . URINE MICROALBUMIN  09/08/1940  . DEXA SCAN  09/07/1997  . HEMOGLOBIN A1C  11/19/2018  . INFLUENZA VACCINE  01/18/2019  . PNA vac Low Risk Adult  Completed   Fall Risk  08/30/2017 06/07/2017  12/22/2016 04/06/2016 11/27/2015  Falls in the past year? No No Yes Yes No  Number falls in past yr: - - 1 1 -  Injury with Fall? - - No Yes -  Risk for fall due to : - - - - -   Functional Status Survey:    Vitals:   12/06/18 1225  BP: 130/70  Pulse: 100  Resp: (!) 22  Temp: 97.6 F (36.4 C)  SpO2: 96%  Weight: 274 lb 8 oz (124.5 kg)  Height: 5'  2" (1.575 m)   Body mass index is 50.21 kg/m. Physical Exam Vitals signs and nursing note reviewed.  Constitutional:      General: She is not in acute distress.    Appearance: She is not ill-appearing, toxic-appearing or diaphoretic.  HENT:     Head: Normocephalic and atraumatic.     Nose: Congestion present. No rhinorrhea.     Mouth/Throat:     Mouth: Mucous membranes are moist.  Eyes:     Extraocular Movements: Extraocular movements intact.     Conjunctiva/sclera: Conjunctivae normal.     Pupils: Pupils are equal, round, and reactive to light.  Neck:     Musculoskeletal: Normal range of motion and neck supple.  Cardiovascular:     Rate and Rhythm: Normal rate.     Heart sounds: No murmur.  Pulmonary:     Breath sounds: No wheezing, rhonchi or rales.  Abdominal:     General: Bowel sounds are normal.     Palpations: Abdomen is soft.     Tenderness: There is no abdominal tenderness. There is no right CVA tenderness, left CVA tenderness, guarding or rebound.  Musculoskeletal:     Right lower leg: Edema present.     Left lower leg: Edema present.     Comments: Chronic lipo dermatosclerosis change BLE. Lateral right knee hematoma is resolving. Left ankle no apparent redness, warmth, tenderness, or swelling upon my examination today.    Skin:    General: Skin is warm and dry.  Neurological:     General: No focal deficit present.     Mental Status: She is alert and oriented to person, place, and time. Mental status is at baseline.     Cranial Nerves: No cranial nerve deficit.     Motor: No weakness.     Coordination:  Coordination normal.     Gait: Gait abnormal.     Deep Tendon Reflexes: Reflexes normal.  Psychiatric:        Mood and Affect: Mood normal.        Behavior: Behavior normal.        Thought Content: Thought content normal.        Judgment: Judgment normal.     Labs reviewed: Recent Labs    01/01/18 0715 01/10/18 0000  05/20/18 0000  08/09/18 0533 08/10/18 0535 10/30/18 11/06/18 1318 11/28/18  NA 142 141   < > 139   < > 138 139 143 140 141  K 4.0 3.7   < > 3.6   < > 3.8 4.1 3.9 3.9 3.6  CL 100 99   < > 100   < > 99 100  --  100  --   CO2 36* 33*   < > 31   < > 30 31  --  32  --   GLUCOSE 115* 86   < > 114*   < > 133* 103*  --  95  --   BUN 17 22   < > 21   < > $R'23 10 13 'Ve$ 7* 14  CREATININE 0.86 0.89*   < > 0.92*   < > 0.97 0.75 0.9 0.99 0.9  CALCIUM 8.0* 8.1*   < > 8.1*   < > 8.1* 7.5*  --  7.9*  --   MG 1.8 1.7  --  1.6  --   --   --   --   --   --    < > = values in this interval not displayed.  Recent Labs    04/23/18 1519 05/20/18 0000 08/10/18 0535 10/30/18 11/06/18 1318  AST $Re'20 16 21 14 'hHT$ 13*  ALT $Re'12 11 11 8 10  'IVd$ ALKPHOS 84  --  79 78 69  BILITOT 0.7 0.5 0.9  --  0.6  PROT 6.6 6.4 5.7*  --  5.7*  ALBUMIN 3.0*  --  2.8*  --  2.7*   Recent Labs    07/05/18 1248  08/09/18 0533 08/10/18 0621 10/30/18 11/06/18 1318  WBC 6.3  --  7.3 5.4 7.3 6.6  NEUTROABS 4.7  --  4.6  --   --  4.7  HGB 12.9   < > 12.2 11.5* 12.6 11.7*  HCT 41.0   < > 39.1 38.6 39 37.7  MCV 92.3  --  96.3 98.2  --  92.2  PLT 142*  --  129* 133* 132* 140*   < > = values in this interval not displayed.   Lab Results  Component Value Date   TSH 20.08 (A) 10/30/2018   Lab Results  Component Value Date   HGBA1C 6.5 (H) 05/20/2018   Lab Results  Component Value Date   CHOL 153 03/05/2018   HDL 45 (L) 03/05/2018   LDLCALC 86 03/05/2018   LDLDIRECT 99.4 03/13/2012   TRIG 127 03/05/2018   CHOLHDL 3.4 03/05/2018    Significant Diagnostic Results in last 30 days:  Ct Head Wo Contrast   Result Date: 11/14/2018 CLINICAL DATA:  Recent fall with headaches and neck pain, initial encounter EXAM: CT HEAD WITHOUT CONTRAST CT CERVICAL SPINE WITHOUT CONTRAST TECHNIQUE: Multidetector CT imaging of the head and cervical spine was performed following the standard protocol without intravenous contrast. Multiplanar CT image reconstructions of the cervical spine were also generated. COMPARISON:  03/17/2016 FINDINGS: CT HEAD FINDINGS Brain: Mild atrophic changes and chronic white matter ischemic changes are noted. No findings to suggest acute hemorrhage, acute infarction or space-occupying mass lesion are seen. Vascular: No hyperdense vessel or unexpected calcification. Skull: Normal. Negative for fracture or focal lesion. Sinuses/Orbits: No acute finding. Other: Scalp hematoma is noted in the left frontal region. CT CERVICAL SPINE FINDINGS Alignment: Within normal limits. Skull base and vertebrae: 7 cervical segments are well visualized. Disc space narrowing is noted from C4-C7 with mild associated osteophytic changes identified. Multilevel facet hypertrophic changes are seen. No acute fracture or dislocation is noted. The odontoid is within normal limits. Soft tissues and spinal canal: Surrounding soft tissue structures are within normal limits. Upper chest: Visualized lung apices are unremarkable. Other: None IMPRESSION: CT of the head: Chronic atrophic and ischemic changes. Left scalp hematoma related to the recent injury. CT of the cervical spine: Multilevel degenerative change without acute abnormality. Electronically Signed   By: Inez Catalina M.D.   On: 11/14/2018 01:47   Ct Cervical Spine Wo Contrast  Result Date: 11/14/2018 CLINICAL DATA:  Recent fall with headaches and neck pain, initial encounter EXAM: CT HEAD WITHOUT CONTRAST CT CERVICAL SPINE WITHOUT CONTRAST TECHNIQUE: Multidetector CT imaging of the head and cervical spine was performed following the standard protocol without intravenous  contrast. Multiplanar CT image reconstructions of the cervical spine were also generated. COMPARISON:  03/17/2016 FINDINGS: CT HEAD FINDINGS Brain: Mild atrophic changes and chronic white matter ischemic changes are noted. No findings to suggest acute hemorrhage, acute infarction or space-occupying mass lesion are seen. Vascular: No hyperdense vessel or unexpected calcification. Skull: Normal. Negative for fracture or focal lesion. Sinuses/Orbits: No acute finding. Other: Scalp hematoma is noted in  the left frontal region. CT CERVICAL SPINE FINDINGS Alignment: Within normal limits. Skull base and vertebrae: 7 cervical segments are well visualized. Disc space narrowing is noted from C4-C7 with mild associated osteophytic changes identified. Multilevel facet hypertrophic changes are seen. No acute fracture or dislocation is noted. The odontoid is within normal limits. Soft tissues and spinal canal: Surrounding soft tissue structures are within normal limits. Upper chest: Visualized lung apices are unremarkable. Other: None IMPRESSION: CT of the head: Chronic atrophic and ischemic changes. Left scalp hematoma related to the recent injury. CT of the cervical spine: Multilevel degenerative change without acute abnormality. Electronically Signed   By: Inez Catalina M.D.   On: 11/14/2018 01:47   Dg Knee Complete 4 Views Left  Result Date: 11/14/2018 CLINICAL DATA:  Left knee pain, initial encounter EXAM: LEFT KNEE - COMPLETE 4+ VIEW COMPARISON:  None. FINDINGS: Left knee prosthesis is noted. No acute fracture or loosening is seen. No soft tissue abnormality is noted. IMPRESSION: Left knee prosthesis without acute abnormality. Electronically Signed   By: Inez Catalina M.D.   On: 11/14/2018 01:41   Dg Knee Complete 4 Views Right  Result Date: 11/14/2018 CLINICAL DATA:  Right knee pain EXAM: RIGHT KNEE - COMPLETE 4+ VIEW COMPARISON:  03/17/2016 FINDINGS: Right knee prosthesis is again identified. Medullary rod is noted  within the femur as well as changes of prior distal fracture with some degree of healing. The overall appearance is stable. No new focal abnormality is seen. IMPRESSION: Right knee prosthesis as well as medullary rod fixation of the right femur. No acute abnormality is noted. Electronically Signed   By: Inez Catalina M.D.   On: 11/14/2018 01:40    Assessment/Plan: Allergic rhinitis The patient denied facial pressure, sinus pain, yellow nasal drainage, she is afebrile. She admitted prn Zyrtec $RemoveBe'10mg'EWCbKKkje$  po is effective and desires to continue. Observe.   Left ankle pain Aches in nature when walking or standing up since more ambulating with therapy, she denied injury, declined X-ray, will have therapy to eval and place an ankle brace for support, observe.   Diarrhea Hx of IBS. GI consultation 12/05/18, she declined colonoscopy, started Imodium ac breakfast, Questran 4gm qam.   Obesity hypoventilation syndrome (HCC) O2 dependent, chronic.     Family/ staff Communication: plan of care reviewed with the patient, the patient's daughter, and charge nurse.   Labs/tests ordered:  None  Time spend 25 minutes.

## 2018-12-06 NOTE — Assessment & Plan Note (Signed)
O2 dependent, chronic.

## 2018-12-06 NOTE — Assessment & Plan Note (Signed)
The patient denied facial pressure, sinus pain, yellow nasal drainage, she is afebrile. She admitted prn Zyrtec $RemoveBe'10mg'NDOGDSrof$  po is effective and desires to continue. Observe.

## 2018-12-06 NOTE — Assessment & Plan Note (Addendum)
Hx of IBS. GI consultation 12/05/18, she declined colonoscopy, started Imodium ac breakfast, Questran 4gm qam.

## 2018-12-06 NOTE — Assessment & Plan Note (Addendum)
Aches in nature when walking or standing up since more ambulating with therapy, she denied injury, declined X-ray, will have therapy to eval and place an ankle brace for support, observe.

## 2018-12-09 ENCOUNTER — Other Ambulatory Visit: Payer: Medicare Other

## 2018-12-09 DIAGNOSIS — R41841 Cognitive communication deficit: Secondary | ICD-10-CM | POA: Diagnosis not present

## 2018-12-09 DIAGNOSIS — R2681 Unsteadiness on feet: Secondary | ICD-10-CM | POA: Diagnosis not present

## 2018-12-09 DIAGNOSIS — Z7389 Other problems related to life management difficulty: Secondary | ICD-10-CM | POA: Diagnosis not present

## 2018-12-09 DIAGNOSIS — M6281 Muscle weakness (generalized): Secondary | ICD-10-CM | POA: Diagnosis not present

## 2018-12-09 DIAGNOSIS — K529 Noninfective gastroenteritis and colitis, unspecified: Secondary | ICD-10-CM | POA: Diagnosis not present

## 2018-12-09 DIAGNOSIS — Z9181 History of falling: Secondary | ICD-10-CM | POA: Diagnosis not present

## 2018-12-09 DIAGNOSIS — R29898 Other symptoms and signs involving the musculoskeletal system: Secondary | ICD-10-CM | POA: Diagnosis not present

## 2018-12-10 ENCOUNTER — Non-Acute Institutional Stay (SKILLED_NURSING_FACILITY): Payer: Medicare Other | Admitting: Nurse Practitioner

## 2018-12-10 ENCOUNTER — Encounter: Payer: Self-pay | Admitting: Nurse Practitioner

## 2018-12-10 DIAGNOSIS — E662 Morbid (severe) obesity with alveolar hypoventilation: Secondary | ICD-10-CM

## 2018-12-10 DIAGNOSIS — M6281 Muscle weakness (generalized): Secondary | ICD-10-CM | POA: Diagnosis not present

## 2018-12-10 DIAGNOSIS — Z7389 Other problems related to life management difficulty: Secondary | ICD-10-CM | POA: Diagnosis not present

## 2018-12-10 DIAGNOSIS — R441 Visual hallucinations: Secondary | ICD-10-CM | POA: Diagnosis not present

## 2018-12-10 DIAGNOSIS — R29898 Other symptoms and signs involving the musculoskeletal system: Secondary | ICD-10-CM | POA: Diagnosis not present

## 2018-12-10 DIAGNOSIS — E039 Hypothyroidism, unspecified: Secondary | ICD-10-CM | POA: Diagnosis not present

## 2018-12-10 DIAGNOSIS — R41841 Cognitive communication deficit: Secondary | ICD-10-CM | POA: Diagnosis not present

## 2018-12-10 DIAGNOSIS — R634 Abnormal weight loss: Secondary | ICD-10-CM | POA: Diagnosis not present

## 2018-12-10 DIAGNOSIS — Z9181 History of falling: Secondary | ICD-10-CM | POA: Diagnosis not present

## 2018-12-10 DIAGNOSIS — F418 Other specified anxiety disorders: Secondary | ICD-10-CM

## 2018-12-10 DIAGNOSIS — K219 Gastro-esophageal reflux disease without esophagitis: Secondary | ICD-10-CM

## 2018-12-10 DIAGNOSIS — I5032 Chronic diastolic (congestive) heart failure: Secondary | ICD-10-CM

## 2018-12-10 DIAGNOSIS — K589 Irritable bowel syndrome without diarrhea: Secondary | ICD-10-CM

## 2018-12-10 DIAGNOSIS — R2681 Unsteadiness on feet: Secondary | ICD-10-CM | POA: Diagnosis not present

## 2018-12-10 NOTE — Assessment & Plan Note (Signed)
Desirable, dietary f/u.

## 2018-12-10 NOTE — Assessment & Plan Note (Addendum)
Compensated clinically, continue Spironolactone 25mg  qd, Metolazone 2.5mg  qd, continue  Kcl 44meq qd, update BMP

## 2018-12-10 NOTE — Assessment & Plan Note (Signed)
Stable, continue Omeprazole 20mg  bid.

## 2018-12-10 NOTE — Assessment & Plan Note (Signed)
Her mood is stabilizing, continue Sertraline 100mg  qd, Quetiapine 12.5mg  qd.

## 2018-12-10 NOTE — Progress Notes (Addendum)
Location:  [facility] Room Number: 45A Place of Service:  SNF [phone]) Provider:  Zyair Rhein Otho Darner, NP  Virgie Dad, MD  Patient Care Team: Virgie Dad, MD as PCP - General (Internal Medicine) Minus Breeding, MD as PCP - Cardiology (Cardiology) Azucena Fallen, MD as Consulting Physician (Obstetrics and Gynecology) Chesley Mires, MD as Consulting Physician (Pulmonary Disease) Fynlee Rowlands X, NP as Nurse Practitioner (Internal Medicine) Christin Fudge, MD as Consulting Physician (Surgery)  Extended Emergency Contact Information Primary Emergency Contact: Boxman,Lee Address: [address]          [place], [place] [address] Johnnette Litter of [place] Phone: [phone] Mobile Phone: [phone] Relation: Daughter Secondary Emergency Contact: Ritta Slot [place] of [place] Mobile Phone: [phone] Relation: Daughter  Code Status:  FULL Goals of care: Advanced Directive information Advanced Directives 12/10/2018  Does Patient Have a Medical Advance Directive? Yes  Type of Paramedic of [place];Living will  Does patient want to make changes to medical advance directive? No - Patient declined  Copy of [place] in Chart? Yes - validated most recent copy scanned in chart (See row information)  Would patient like information on creating a medical advance directive? -  Pre-existing out of facility DNR order (yellow form or pink MOST form) -     Chief Complaint  Patient presents with   Medical Management of Chronic Issues    Routine Visit     HPI:  Pt is a 85 y.o. female seen today for medical management of chronic diseases.    The patient has chronic loose stools, underwent GI consult, started on Questran 4gm qd, Imodium $RemoveBef'2mg'XXvcbrciwj$  qd, she stated she is constipated, no BM in 2 days. She denied abd pain, nausea, or vomiting. She is afebrile. Hx of CHF/ edema BLE, stable, on Spironolactone $RemoveBeforeDEI'25mg'FLgLbElLjNRSmKrR$  qd, Metolazone 2.$RemoveBeforeDE'5mg'QOacTfILJeVbHGh$   qd. Her mood is stable on Sertraline $RemoveBefor'100mg'mstUMPwpfrKw$  qd, Quetiapine 12.$RemoveBeforeDEI'5mg'KkNXEzcvYKRfzlVx$  qd. GERD, stable, on Omeprazole $RemoveBefor'20mg'SYOsKsiruhos$  bid. Hypothyroidism, on Levothyroxine 139mcg qd, last TSH 20.08 10/30/18, pending TSH due to non compliant of meds prior to SNF [facility].    Past Medical History:  Diagnosis Date   ARTHRITIS, KNEES, BILATERAL    Breast CA ([facility]) 2000   s/p R lumpectomy and XRT   CHF (congestive heart failure) (HCC)    DEPRESSION    DJD (degenerative joint disease) of knee    GERD    HYPERLIPIDEMIA    HYPERTENSION    HYPOTHYROIDISM    postsurgical   Hypoxia    MIGRAINE HEADACHE    Morbid obesity (HCC)    OSA (obstructive sleep apnea) 01/17/2011 dx   Oxygen dependent    Urge incontinence    UTI (lower urinary tract infection) 12/2014   Past Surgical History:  Procedure Laterality Date   ABDOMINAL HYSTERECTOMY     ADENOIDECTOMY     BREAST BIOPSY  2000   BREAST LUMPECTOMY  2000   CATARACT EXTRACTION     CHOLECYSTECTOMY  1995   CYSTOSCOPY/URETEROSCOPY/HOLMIUM LASER Right 06/28/2015   Procedure: CYSTOSCOPY RIGHT RETROGRAD RIGHT URETEROSCOPY/HOLMIUM LASER WITH RIGHT STENT PLACEMENT;  Surgeon: Alexis Frock, MD;  Location: [facility];  Service: Urology;  Laterality: Right;   PARATHYROIDECTOMY     2-3 removed   REPLACEMENT TOTAL KNEE BILATERAL  1999, 2005   Right leg femur repaired     THYROIDECTOMY     TONSILLECTOMY     TUBAL LIGATION      Allergies  Allergen Reactions   Azithromycin  Itching   Beta Adrenergic Blockers Other (See Comments)    Depression    Ciprofloxacin Other (See Comments)    Edgy and very jumpy     Codeine Nausea And Vomiting   Darvon [Propoxyphene] Nausea And Vomiting   Epinephrine Other (See Comments)    Rapid pulse, sweats   Klonopin [Clonazepam] Other (See Comments)    Makes her feel very suicidal    Oxycodone Other (See Comments)    Patient felt like it altered her mental status "Crazy" . Hallucinations later as well.   Paxil  [Paroxetine Hydrochloride] Other (See Comments)    Severe depression    Prednisone Other (See Comments)    "makes me feel really bad"   Propoxyphene Hcl Nausea And Vomiting   Tape Itching   Torsemide Other (See Comments)    Patient stated that she doesn't recall the reaction   Tramadol Other (See Comments)    Feels "jittery"   Vicodin [Hydrocodone-Acetaminophen] Other (See Comments)    Hallucinations   Meloxicam Diarrhea   Other Rash    Polyester sheets "cause me a rash"   Vancomycin Rash    Localized rash related to infusion rate    Outpatient Encounter Medications as of 12/10/2018  Medication Sig   acetaminophen (TYLENOL) 325 MG tablet Take 650 mg by mouth every 8 (eight) hours as needed for mild pain or moderate pain.    aspirin 81 MG tablet Take 81 mg by mouth daily.    calcium citrate-vitamin D (CITRACAL+D) 315-200 MG-UNIT tablet Take 1 tablet by mouth daily. $RemoveBefo'200mg'FAZIJuRgVNk$  tablet daily   cetirizine (ZYRTEC) 10 MG chewable tablet Chew 10 mg by mouth daily as needed for allergies.    cholestyramine (QUESTRAN) 4 g packet Take 1 packet (4 g total) by mouth daily. Mix in juice or water once daily in the morning 2 hours away from any other medications   cyanocobalamin 2000 MCG tablet Take 2,000 mcg by mouth daily.   levothyroxine (SYNTHROID) 150 MCG tablet Take 150 mcg by mouth daily before breakfast. Patient is to use name brand medication (Synthroid only)   loperamide (IMODIUM) 2 MG capsule Take 2 mg by mouth daily. Every morning before breakfast   Melatonin 5 MG TABS Take 5 mg by mouth at bedtime.   metolazone (ZAROXOLYN) 2.5 MG tablet Take 2.5 mg by mouth daily.   omeprazole (PRILOSEC) 20 MG capsule Take 20 mg by mouth 2 (two) times a day.    ondansetron (ZOFRAN) 4 MG tablet TAKE (1) TABLET BY MOUTH EVERY SIX HOURS AS NEEDED FOR NAUSEA.   OXYGEN Inhale 2 L into the lungs continuous. KEEP O2 SATS >= 90%. Every Shift   polyvinyl alcohol (LIQUIFILM TEARS) 1.4 %  ophthalmic solution Place 2 drops into both eyes 4 (four) times daily.   potassium chloride (MICRO-K) 10 MEQ CR capsule Take 40 mEq by mouth daily. Take 4 capsules   QUEtiapine (SEROQUEL) 25 MG tablet Take 12.5 mg by mouth at bedtime.   saccharomyces boulardii (FLORASTOR) 250 MG capsule Take 250 mg by mouth daily.   sertraline (ZOLOFT) 100 MG tablet Take 100 mg by mouth daily.   spironolactone (ALDACTONE) 25 MG tablet Take 25 mg by mouth daily.   No facility-administered encounter medications on file as of 12/10/2018.     Review of Systems  Constitutional: Positive for unexpected weight change. Negative for activity change, appetite change, chills, diaphoresis, fatigue and fever.       Weight loss about #10 Ibs in the past month.  HENT: Positive for hearing loss. Negative for congestion and voice change.   Respiratory: Positive for shortness of breath. Negative for cough and wheezing.        Chronic, O2 2lpm via [place]  Cardiovascular: Positive for leg swelling. Negative for chest pain and palpitations.  Gastrointestinal: Positive for constipation. Negative for abdominal distention, abdominal pain, blood in stool, diarrhea and vomiting.  Genitourinary: Negative for difficulty urinating, dysuria and urgency.  Musculoskeletal: Positive for arthralgias and gait problem.  Skin: Negative for color change and pallor.  Neurological: Negative for dizziness, facial asymmetry, speech difficulty, weakness and headaches.       Memory lapses.   Psychiatric/Behavioral: Negative for agitation, behavioral problems, dysphoric mood, hallucinations and sleep disturbance. The patient is not nervous/anxious.     Immunization History  Administered Date(s) Administered   Influenza Split 03/20/2011   Influenza Whole 03/19/2009, 03/19/2012   Influenza, High Dose Seasonal PF 03/16/2018   Influenza,inj,Quad PF,6+ Mos 03/19/2013   Influenza-Unspecified 03/03/2014, 03/15/2015, 03/30/2016, 04/05/2017    Meningococcal Polysaccharide 03/15/2015   PPD Test 03/30/2016   Pneumococcal Conjugate-13 03/12/2014   Pneumococcal Polysaccharide-23 06/19/2006, 03/15/2015   Td 10/04/2009   Tdap 06/19/2005, 11/14/2018   Zoster 06/19/2010   Zoster Recombinat (Shingrix) 01/28/2018, 04/22/2018, 05/22/2018   Pertinent  Health Maintenance Due  Topic Date Due   FOOT EXAM  09/08/1940   OPHTHALMOLOGY EXAM  09/08/1940   URINE MICROALBUMIN  09/08/1940   DEXA SCAN  09/07/1997   HEMOGLOBIN A1C  11/19/2018   INFLUENZA VACCINE  01/18/2019   PNA vac Low Risk Adult  Completed   Fall Risk  08/30/2017 06/07/2017 12/22/2016 04/06/2016 11/27/2015  Falls in the past year? No No Yes Yes No  Number falls in past yr: - - 1 1 -  Injury with Fall? - - No Yes -  Risk for fall due to : - - - - -   Functional Status Survey:    Vitals:   12/10/18 0930  BP: 130/70  Pulse: 78  Resp: (!) 22  Temp: (!) 97 F (36.1 C)  TempSrc: Oral  SpO2: 97%  Weight: 271 lb 12.8 oz (123.3 kg)  Height: $Remove'5\' 2"'XPulmCC$  (1.575 m)   Body mass index is 49.71 kg/m. Physical Exam Vitals signs and nursing note reviewed.  Constitutional:      General: She is not in acute distress.    Appearance: Normal appearance. She is obese. She is not ill-appearing, toxic-appearing or diaphoretic.  HENT:     Head: Normocephalic and atraumatic.     Nose: Congestion and rhinorrhea present.     Mouth/Throat:     Mouth: Mucous membranes are moist.  Eyes:     Extraocular Movements: Extraocular movements intact.     Conjunctiva/sclera: Conjunctivae normal.     Pupils: Pupils are equal, round, and reactive to light.  Neck:     Musculoskeletal: Normal range of motion and neck supple.  Cardiovascular:     Rate and Rhythm: Normal rate and regular rhythm.     Heart sounds: No murmur.  Pulmonary:     Effort: Pulmonary effort is normal.     Breath sounds: No wheezing, rhonchi or rales.  Abdominal:     General: Bowel sounds are normal. There is no  distension.     Palpations: Abdomen is soft.     Tenderness: There is no abdominal tenderness. There is no right CVA tenderness, left CVA tenderness, guarding or rebound.  Musculoskeletal:     Right lower leg: Edema present.  Left lower leg: Edema present.     Comments: Moderate edema BLE. Electric scooter to get around.   Skin:    General: Skin is warm and dry.     Comments: Chronic lipo dermatosclerosis skin changes BLE.   Neurological:     General: No focal deficit present.     Mental Status: She is alert and oriented to person, place, and time. Mental status is at baseline.     Cranial Nerves: No cranial nerve deficit.     Motor: No weakness.     Coordination: Coordination normal.     Gait: Gait abnormal.     Deep Tendon Reflexes: Reflexes normal.  Psychiatric:        Mood and Affect: Mood normal.        Behavior: Behavior normal.        Thought Content: Thought content normal.     Labs reviewed: Recent Labs    01/01/18 0715 01/10/18 0000  05/20/18 0000  08/09/18 0533 08/10/18 0535 10/30/18 11/06/18 1318 11/28/18  NA 142 141   < > 139   < > 138 139 143 140 141  K 4.0 3.7   < > 3.6   < > 3.8 4.1 3.9 3.9 3.6  CL 100 99   < > 100   < > 99 100  --  100  --   CO2 36* 33*   < > 31   < > 30 31  --  32  --   GLUCOSE 115* 86   < > 114*   < > 133* 103*  --  95  --   BUN 17 22   < > 21   < > $R'23 10 13 'bR$ 7* 14  CREATININE 0.86 0.89*   < > 0.92*   < > 0.97 0.75 0.9 0.99 0.9  CALCIUM 8.0* 8.1*   < > 8.1*   < > 8.1* 7.5*  --  7.9*  --   MG 1.8 1.7  --  1.6  --   --   --   --   --   --    < > = values in this interval not displayed.   Recent Labs    04/23/18 1519 05/20/18 0000 08/10/18 0535 10/30/18 11/06/18 1318  AST $Re'20 16 21 14 'Vto$ 13*  ALT $Re'12 11 11 8 10  'LhZ$ ALKPHOS 84  --  79 78 69  BILITOT 0.7 0.5 0.9  --  0.6  PROT 6.6 6.4 5.7*  --  5.7*  ALBUMIN 3.0*  --  2.8*  --  2.7*   Recent Labs    07/05/18 1248  08/09/18 0533 08/10/18 0621 10/30/18 11/06/18 1318  WBC 6.3  --   7.3 5.4 7.3 6.6  NEUTROABS 4.7  --  4.6  --   --  4.7  HGB 12.9   < > 12.2 11.5* 12.6 11.7*  HCT 41.0   < > 39.1 38.6 39 37.7  MCV 92.3  --  96.3 98.2  --  92.2  PLT 142*  --  129* 133* 132* 140*   < > = values in this interval not displayed.   Lab Results  Component Value Date   TSH 20.08 (A) 10/30/2018   Lab Results  Component Value Date   HGBA1C 6.5 (H) 05/20/2018   Lab Results  Component Value Date   CHOL 153 03/05/2018   HDL 45 (L) 03/05/2018   LDLCALC 86 03/05/2018   LDLDIRECT 99.4 03/13/2012   TRIG 127  03/05/2018   CHOLHDL 3.4 03/05/2018    Significant Diagnostic Results in last 30 days:  Ct Head Wo Contrast  Result Date: 11/14/2018 CLINICAL DATA:  Recent fall with headaches and neck pain, initial encounter EXAM: CT HEAD WITHOUT CONTRAST CT CERVICAL SPINE WITHOUT CONTRAST TECHNIQUE: Multidetector CT imaging of the head and cervical spine was performed following the standard protocol without intravenous contrast. Multiplanar CT image reconstructions of the cervical spine were also generated. COMPARISON:  03/17/2016 FINDINGS: CT HEAD FINDINGS Brain: Mild atrophic changes and chronic white matter ischemic changes are noted. No findings to suggest acute hemorrhage, acute infarction or space-occupying mass lesion are seen. Vascular: No hyperdense vessel or unexpected calcification. Skull: Normal. Negative for fracture or focal lesion. Sinuses/Orbits: No acute finding. Other: Scalp hematoma is noted in the left frontal region. CT CERVICAL SPINE FINDINGS Alignment: Within normal limits. Skull base and vertebrae: 7 cervical segments are well visualized. Disc space narrowing is noted from C4-C7 with mild associated osteophytic changes identified. Multilevel facet hypertrophic changes are seen. No acute fracture or dislocation is noted. The odontoid is within normal limits. Soft tissues and spinal canal: Surrounding soft tissue structures are within normal limits. Upper chest: Visualized  lung apices are unremarkable. Other: None IMPRESSION: CT of the head: Chronic atrophic and ischemic changes. Left scalp hematoma related to the recent injury. CT of the cervical spine: Multilevel degenerative change without acute abnormality. Electronically Signed   By: Inez Catalina M.D.   On: 11/14/2018 01:47   Ct Cervical Spine Wo Contrast  Result Date: 11/14/2018 CLINICAL DATA:  Recent fall with headaches and neck pain, initial encounter EXAM: CT HEAD WITHOUT CONTRAST CT CERVICAL SPINE WITHOUT CONTRAST TECHNIQUE: Multidetector CT imaging of the head and cervical spine was performed following the standard protocol without intravenous contrast. Multiplanar CT image reconstructions of the cervical spine were also generated. COMPARISON:  03/17/2016 FINDINGS: CT HEAD FINDINGS Brain: Mild atrophic changes and chronic white matter ischemic changes are noted. No findings to suggest acute hemorrhage, acute infarction or space-occupying mass lesion are seen. Vascular: No hyperdense vessel or unexpected calcification. Skull: Normal. Negative for fracture or focal lesion. Sinuses/Orbits: No acute finding. Other: Scalp hematoma is noted in the left frontal region. CT CERVICAL SPINE FINDINGS Alignment: Within normal limits. Skull base and vertebrae: 7 cervical segments are well visualized. Disc space narrowing is noted from C4-C7 with mild associated osteophytic changes identified. Multilevel facet hypertrophic changes are seen. No acute fracture or dislocation is noted. The odontoid is within normal limits. Soft tissues and spinal canal: Surrounding soft tissue structures are within normal limits. Upper chest: Visualized lung apices are unremarkable. Other: None IMPRESSION: CT of the head: Chronic atrophic and ischemic changes. Left scalp hematoma related to the recent injury. CT of the cervical spine: Multilevel degenerative change without acute abnormality. Electronically Signed   By: Inez Catalina M.D.   On: 11/14/2018  01:47   Dg Knee Complete 4 Views Left  Result Date: 11/14/2018 CLINICAL DATA:  Left knee pain, initial encounter EXAM: LEFT KNEE - COMPLETE 4+ VIEW COMPARISON:  None. FINDINGS: Left knee prosthesis is noted. No acute fracture or loosening is seen. No soft tissue abnormality is noted. IMPRESSION: Left knee prosthesis without acute abnormality. Electronically Signed   By: Inez Catalina M.D.   On: 11/14/2018 01:41   Dg Knee Complete 4 Views Right  Result Date: 11/14/2018 CLINICAL DATA:  Right knee pain EXAM: RIGHT KNEE - COMPLETE 4+ VIEW COMPARISON:  03/17/2016 FINDINGS: Right knee prosthesis is again  identified. Medullary rod is noted within the femur as well as changes of prior distal fracture with some degree of healing. The overall appearance is stable. No new focal abnormality is seen. IMPRESSION: Right knee prosthesis as well as medullary rod fixation of the right femur. No acute abnormality is noted. Electronically Signed   By: Inez Catalina M.D.   On: 11/14/2018 01:40    Assessment/Plan IBS (irritable bowel syndrome) Constipation, MiraLax 17gm +4Oz water x 1, will change Imodium 2mg  prn daily, continue Questran 4gm qd.   Chronic diastolic CHF (congestive heart failure) (HCC) Compensated clinically, continue Spironolactone 25mg  qd, Metolazone 2.5mg  qd, continue  Kcl 12meq qd, update BMP  Obesity hypoventilation syndrome (HCC) Continue O2 2lpm via [place] to maintain Sat o2>89%.   Acid reflux Stable, continue Omeprazole 20mg  bid.   Hypothyroidism (acquired) Continue Levothyroxine 183mcg qd, pending TSH.   Depression with anxiety Her mood is stabilizing, continue Sertraline 100mg  qd, Quetiapine 12.5mg  qd.   Hallucination, visual Resolving, continue Quetiapine 12.5mg  qd.   Weight loss Desirable, dietary f/u.      Family/ staff Communication: plan of care reviewed with the patient and charge nurse.   Labs/tests ordered: BMP  Time spend 25 minutes.

## 2018-12-10 NOTE — Assessment & Plan Note (Signed)
Continue Levothyroxine 1106mcg qd, pending TSH.

## 2018-12-10 NOTE — Assessment & Plan Note (Signed)
Resolving, continue Quetiapine 12.5mg  qd.

## 2018-12-10 NOTE — Assessment & Plan Note (Signed)
Constipation, MiraLax 17gm +4Oz water x 1, will change Imodium $RemoveBefore'2mg'DLPUwYXymMjZM$  prn daily, continue Questran 4gm qd.

## 2018-12-10 NOTE — Assessment & Plan Note (Signed)
Continue O2 2lpm via [place] to maintain Sat o2>89%.

## 2018-12-11 DIAGNOSIS — R41841 Cognitive communication deficit: Secondary | ICD-10-CM | POA: Diagnosis not present

## 2018-12-11 DIAGNOSIS — R2681 Unsteadiness on feet: Secondary | ICD-10-CM | POA: Diagnosis not present

## 2018-12-11 DIAGNOSIS — M6281 Muscle weakness (generalized): Secondary | ICD-10-CM | POA: Diagnosis not present

## 2018-12-11 DIAGNOSIS — Z9181 History of falling: Secondary | ICD-10-CM | POA: Diagnosis not present

## 2018-12-11 DIAGNOSIS — Z7389 Other problems related to life management difficulty: Secondary | ICD-10-CM | POA: Diagnosis not present

## 2018-12-11 DIAGNOSIS — R29898 Other symptoms and signs involving the musculoskeletal system: Secondary | ICD-10-CM | POA: Diagnosis not present

## 2018-12-12 DIAGNOSIS — R41841 Cognitive communication deficit: Secondary | ICD-10-CM | POA: Diagnosis not present

## 2018-12-12 DIAGNOSIS — Z7389 Other problems related to life management difficulty: Secondary | ICD-10-CM | POA: Diagnosis not present

## 2018-12-12 DIAGNOSIS — R29898 Other symptoms and signs involving the musculoskeletal system: Secondary | ICD-10-CM | POA: Diagnosis not present

## 2018-12-12 DIAGNOSIS — Z9181 History of falling: Secondary | ICD-10-CM | POA: Diagnosis not present

## 2018-12-12 DIAGNOSIS — R2681 Unsteadiness on feet: Secondary | ICD-10-CM | POA: Diagnosis not present

## 2018-12-12 DIAGNOSIS — M6281 Muscle weakness (generalized): Secondary | ICD-10-CM | POA: Diagnosis not present

## 2018-12-13 DIAGNOSIS — Z9181 History of falling: Secondary | ICD-10-CM | POA: Diagnosis not present

## 2018-12-13 DIAGNOSIS — M6281 Muscle weakness (generalized): Secondary | ICD-10-CM | POA: Diagnosis not present

## 2018-12-13 DIAGNOSIS — R2681 Unsteadiness on feet: Secondary | ICD-10-CM | POA: Diagnosis not present

## 2018-12-13 DIAGNOSIS — Z7389 Other problems related to life management difficulty: Secondary | ICD-10-CM | POA: Diagnosis not present

## 2018-12-13 DIAGNOSIS — R41841 Cognitive communication deficit: Secondary | ICD-10-CM | POA: Diagnosis not present

## 2018-12-13 DIAGNOSIS — R29898 Other symptoms and signs involving the musculoskeletal system: Secondary | ICD-10-CM | POA: Diagnosis not present

## 2018-12-15 DIAGNOSIS — M6281 Muscle weakness (generalized): Secondary | ICD-10-CM | POA: Diagnosis not present

## 2018-12-15 DIAGNOSIS — R29898 Other symptoms and signs involving the musculoskeletal system: Secondary | ICD-10-CM | POA: Diagnosis not present

## 2018-12-15 DIAGNOSIS — Z9181 History of falling: Secondary | ICD-10-CM | POA: Diagnosis not present

## 2018-12-15 DIAGNOSIS — R41841 Cognitive communication deficit: Secondary | ICD-10-CM | POA: Diagnosis not present

## 2018-12-15 DIAGNOSIS — Z7389 Other problems related to life management difficulty: Secondary | ICD-10-CM | POA: Diagnosis not present

## 2018-12-15 DIAGNOSIS — R2681 Unsteadiness on feet: Secondary | ICD-10-CM | POA: Diagnosis not present

## 2018-12-16 ENCOUNTER — Telehealth: Payer: Self-pay | Admitting: Physician Assistant

## 2018-12-16 DIAGNOSIS — Z7389 Other problems related to life management difficulty: Secondary | ICD-10-CM | POA: Diagnosis not present

## 2018-12-16 DIAGNOSIS — R29898 Other symptoms and signs involving the musculoskeletal system: Secondary | ICD-10-CM | POA: Diagnosis not present

## 2018-12-16 DIAGNOSIS — R2681 Unsteadiness on feet: Secondary | ICD-10-CM | POA: Diagnosis not present

## 2018-12-16 DIAGNOSIS — M6281 Muscle weakness (generalized): Secondary | ICD-10-CM | POA: Diagnosis not present

## 2018-12-16 DIAGNOSIS — Z9181 History of falling: Secondary | ICD-10-CM | POA: Diagnosis not present

## 2018-12-16 DIAGNOSIS — R41841 Cognitive communication deficit: Secondary | ICD-10-CM | POA: Diagnosis not present

## 2018-12-16 NOTE — Telephone Encounter (Signed)
Let pt know that the results are not back yet, we will call her with results when they are complete.

## 2018-12-16 NOTE — Telephone Encounter (Signed)
Pt called inquiring about stool test results. 

## 2018-12-17 ENCOUNTER — Telehealth: Payer: Self-pay | Admitting: *Deleted

## 2018-12-17 DIAGNOSIS — R41841 Cognitive communication deficit: Secondary | ICD-10-CM | POA: Diagnosis not present

## 2018-12-17 DIAGNOSIS — R2681 Unsteadiness on feet: Secondary | ICD-10-CM | POA: Diagnosis not present

## 2018-12-17 DIAGNOSIS — Z7389 Other problems related to life management difficulty: Secondary | ICD-10-CM | POA: Diagnosis not present

## 2018-12-17 DIAGNOSIS — I5032 Chronic diastolic (congestive) heart failure: Secondary | ICD-10-CM | POA: Diagnosis not present

## 2018-12-17 DIAGNOSIS — M6281 Muscle weakness (generalized): Secondary | ICD-10-CM | POA: Diagnosis not present

## 2018-12-17 DIAGNOSIS — Z9181 History of falling: Secondary | ICD-10-CM | POA: Diagnosis not present

## 2018-12-17 DIAGNOSIS — R29898 Other symptoms and signs involving the musculoskeletal system: Secondary | ICD-10-CM | POA: Diagnosis not present

## 2018-12-17 LAB — BASIC METABOLIC PANEL
BUN: 14 (ref 4–21)
Creatinine: 0.9 (ref 0.5–1.1)
Glucose: 101
Potassium: 4 (ref 3.4–5.3)
Sodium: 140 (ref 137–147)

## 2018-12-17 NOTE — Telephone Encounter (Signed)
Unable to leave a message, no voicemail.  

## 2018-12-19 LAB — GASTROINTESTINAL PATHOGEN PANEL PCR
C. difficile Tox A/B, PCR: NOT DETECTED
Campylobacter, PCR: NOT DETECTED
Cryptosporidium, PCR: NOT DETECTED
E coli (ETEC) LT/ST PCR: NOT DETECTED
E coli (STEC) stx1/stx2, PCR: NOT DETECTED
E coli 0157, PCR: NOT DETECTED
Giardia lamblia, PCR: NOT DETECTED
Norovirus, PCR: NOT DETECTED
Rotavirus A, PCR: NOT DETECTED
Salmonella, PCR: NOT DETECTED
Shigella, PCR: NOT DETECTED

## 2018-12-19 LAB — FECAL LACTOFERRIN, QUANT
Fecal Lactoferrin: POSITIVE — AB
MICRO NUMBER:: 593493
SPECIMEN QUALITY:: ADEQUATE

## 2018-12-24 ENCOUNTER — Telehealth: Payer: Self-pay | Admitting: Physician Assistant

## 2018-12-24 DIAGNOSIS — E039 Hypothyroidism, unspecified: Secondary | ICD-10-CM | POA: Diagnosis not present

## 2018-12-24 NOTE — Telephone Encounter (Signed)
Attempted to call pt but there was no answer.

## 2018-12-25 NOTE — Telephone Encounter (Signed)
Pt calling for lab results, please advise. 

## 2018-12-26 LAB — TSH: TSH: 21.2 — AB (ref 0.41–5.90)

## 2018-12-28 DIAGNOSIS — Z1159 Encounter for screening for other viral diseases: Secondary | ICD-10-CM | POA: Diagnosis not present

## 2018-12-31 ENCOUNTER — Encounter: Payer: Self-pay | Admitting: Neurology

## 2018-12-31 ENCOUNTER — Telehealth: Payer: Self-pay | Admitting: Neurology

## 2018-12-31 ENCOUNTER — Ambulatory Visit (INDEPENDENT_AMBULATORY_CARE_PROVIDER_SITE_OTHER): Payer: Medicare Other | Admitting: Neurology

## 2018-12-31 ENCOUNTER — Other Ambulatory Visit: Payer: Self-pay

## 2018-12-31 VITALS — BP 119/70 | HR 74 | Temp 97.5°F

## 2018-12-31 DIAGNOSIS — R410 Disorientation, unspecified: Secondary | ICD-10-CM | POA: Diagnosis not present

## 2018-12-31 DIAGNOSIS — R441 Visual hallucinations: Secondary | ICD-10-CM | POA: Diagnosis not present

## 2018-12-31 MED ORDER — LAMOTRIGINE 100 MG PO TABS: 100.0000 mg | ORAL_TABLET | Freq: Two times a day (BID) | ORAL | 11 refills | Status: DC

## 2018-12-31 MED ORDER — LAMOTRIGINE 25 MG PO TABS: ORAL_TABLET | ORAL | 0 refills | Status: DC

## 2018-12-31 NOTE — Telephone Encounter (Signed)
Medicare/mutual of [place] order sent to GI. No auth they will reach out to the patient to schedule.  

## 2018-12-31 NOTE — Progress Notes (Signed)
PATIENT: Tamara Morales DOB: 05-16-1931  Chief Complaint  Patient presents with  . Altered Mental Status    MMSE 28/30 - 14 animals.  She resides in [facility].  She is here with her daughter, [name].  Reports having a fall in May 2020 where she hit her head.  Since the fall, she has been experiencing a decline in the following: mild worsening memory, increased confused, hallucinations, word finding difficulty.  She also has been isolated from her family which has made things worse.   [name] PCP    Virgie Dad, MD     HISTORICAL  Tamara Morales is a 85 year old female, seen in request by her primary care physician Dr. Lyndel Safe, [name] for evaluation of visual hallucination, confusion, she is accompanied by her daughter [name] at today's visit on December 31, 2018.  I have reviewed and summarized the referring note from the referring physician.  She has past medical history of obesity, bilateral lower extremity lymphedema, congestive heart failure, hypertension, hypothyroidism, vitamin B12 deficiency, but denies significant history of memory loss, her mother suffered memory in her 44s.  She has baseline gait abnormality, using motorized chair, able to transfer with assistance.  She has been at [facility] independent living, but she decided to stop some of her medications in early March 2020, she was noted to have mild confusion, she was moved to assisted living, and on Nov 14 2018, she fell landed face down, began to have headaches, neck pain, personally reviewed CT head without contrast, chronic generalized atrophy, ischemic changes, no acute abnormality.  CT of cervical spine showed multilevel degenerative changes without acute abnormality.  Laboratory evaluations showed elevated ESR 79, C-reactive protein 22, normal IgA, BMP, negative troponin BNP was elevated 190 TSH was elevated 20, CMP showed creatinine of 0.9, CBC showed hemoglobin of 11.7,  Ever since that fall,  she began to have visual hallucinations, she has vivid hallucinations that her deceased husband is with her at [facility], while she was sitting at her nursing home room, was quite upset when he left her, she had visual hallucinations spells almost on a daily basis, daughter also reported that the spells are usually short lasting for few minutes, she was noted to have mumbled speech, very confused afterwards, sometimes dose of into sleep, there was no clinical seizure activity noted neck.  She denies significant history of memory loss, her mother suffered mild memory loss in her 61s. REVIEW OF SYSTEMS: Full 14 system review of systems performed and notable only for as above All other review of systems were negative.  ALLERGIES: Allergies  Allergen Reactions  . Azithromycin Itching  . Beta Adrenergic Blockers Other (See Comments)    Depression   . Ciprofloxacin Other (See Comments)    Edgy and very jumpy    . Codeine Nausea And Vomiting  . Darvon [Propoxyphene] Nausea And Vomiting  . Epinephrine Other (See Comments)    Rapid pulse, sweats  . Klonopin [Clonazepam] Other (See Comments)    Makes her feel very suicidal   . Oxycodone Other (See Comments)    Patient felt like it altered her mental status "Crazy" . Hallucinations later as well.  [name] Paxil [Paroxetine Hydrochloride] Other (See Comments)    Severe depression   . Prednisone Other (See Comments)    "makes me feel really bad"  . Propoxyphene Hcl Nausea And Vomiting  . Tape Itching  . Torsemide Other (See Comments)    Patient stated that she  doesn't recall the reaction  . Tramadol Other (See Comments)    Feels "jittery"  . Vicodin [Hydrocodone-Acetaminophen] Other (See Comments)    Hallucinations  . Meloxicam Diarrhea  . Other Rash    Polyester sheets "cause me a rash"  . Vancomycin Rash    Localized rash related to infusion rate    HOME MEDICATIONS: Current Outpatient Medications  Medication Sig Dispense Refill  .  acetaminophen (TYLENOL) 325 MG tablet Take 650 mg by mouth every 8 (eight) hours as needed for mild pain or moderate pain.     [name] aspirin 81 MG tablet Take 81 mg by mouth daily.     . calcium citrate-vitamin D (CITRACAL+D) 315-200 MG-UNIT tablet Take 1 tablet by mouth daily. 223m tablet daily    . cetirizine (ZYRTEC) 10 MG chewable tablet Chew 10 mg by mouth daily as needed for allergies.     . cholestyramine (QUESTRAN) 4 g packet Take 1 packet (4 g total) by mouth daily. Mix in juice or water once daily in the morning 2 hours away from any other medications 30 each 8  . cyanocobalamin 2000 MCG tablet Take 2,000 mcg by mouth daily.    .Marland Kitchenlevothyroxine (SYNTHROID) 150 MCG tablet Take 150 mcg by mouth daily before breakfast. Patient is to use name brand medication (Synthroid only)    . loperamide (IMODIUM) 2 MG capsule Take 2 mg by mouth daily. Every morning before breakfast    . Melatonin 5 MG TABS Take 5 mg by mouth at bedtime.    . metolazone (ZAROXOLYN) 2.5 MG tablet Take 2.5 mg by mouth daily.    .Marland Kitchenomeprazole (PRILOSEC) 20 MG capsule Take 20 mg by mouth 2 (two) times a day.     . ondansetron (ZOFRAN) 4 MG tablet TAKE (1) TABLET BY MOUTH EVERY SIX HOURS AS NEEDED FOR NAUSEA. 12 tablet 0  . OXYGEN Inhale 2 L into the lungs continuous. KEEP O2 SATS >= 90%. Every Shift    . polyvinyl alcohol (LIQUIFILM TEARS) 1.4 % ophthalmic solution Place 2 drops into both eyes 4 (four) times daily.    . potassium chloride (MICRO-K) 10 MEQ CR capsule Take 40 mEq by mouth daily. Take 4 capsules    . QUEtiapine (SEROQUEL) 25 MG tablet Take 12.5 mg by mouth at bedtime.    . saccharomyces boulardii (FLORASTOR) 250 MG capsule Take 250 mg by mouth daily.    . sertraline (ZOLOFT) 100 MG tablet Take 100 mg by mouth daily.    .Marland Kitchenspironolactone (ALDACTONE) 25 MG tablet Take 25 mg by mouth daily.     No current facility-administered medications for this visit.     PAST MEDICAL HISTORY: Past Medical History:  Diagnosis  Date  . Altered mental status   . ARTHRITIS, KNEES, BILATERAL   . Breast CA ([facility] [place]   s/p R lumpectomy and XRT  [place]   . DEPRESSION   . DJD (degenerative joint disease) of knee   . GERD   . HYPERLIPIDEMIA   . HYPERTENSION   . HYPOTHYROIDISM    postsurgical  . Hypoxia   . MIGRAINE HEADACHE   . Morbid obesity ([place]   . OSA (obstructive sleep apnea) 01/17/2011 dx  . Oxygen dependent   . Urge incontinence   . UTI (lower urinary tract infection) 12/2014    PAST SURGICAL HISTORY: Past Surgical History:  Procedure Laterality Date  . ABDOMINAL HYSTERECTOMY    . ADENOIDECTOMY    . BREAST BIOPSY  2000  . BREAST LUMPECTOMY  2000  . CATARACT EXTRACTION    . CHOLECYSTECTOMY  1995  . CYSTOSCOPY/URETEROSCOPY/HOLMIUM LASER Right 06/28/2015   Procedure: CYSTOSCOPY RIGHT RETROGRAD RIGHT URETEROSCOPY/HOLMIUM LASER WITH RIGHT STENT PLACEMENT;  Surgeon: Alexis Frock, MD;  Location: [facility];  Service: Urology;  Laterality: Right;  . PARATHYROIDECTOMY     2-3 removed  . REPLACEMENT TOTAL KNEE BILATERAL  1999, 2005  . Right leg femur repaired    . THYROIDECTOMY    . TONSILLECTOMY    . TUBAL LIGATION      FAMILY HISTORY: Family History  Problem Relation Age of Onset  . Heart disease Mother   . Heart disease Father   . Emphysema Sister   . Coronary artery disease Neg Hx     SOCIAL HISTORY: Social History   Socioeconomic History  . Marital status: Married    Spouse name: Not on file  . Number of children: 5  . Years of education: some college  . Highest education level: Not on file  Occupational History  . Occupation: RETIRED    Employer: RETIRED    Comment: worked on Hotel manager  . Financial resource strain: Not hard at all  . Food insecurity    Worry: Never true    Inability: Never true  . Transportation needs    Medical: No    Non-medical: No  Tobacco Use  . Smoking status: Former Smoker    Packs/day: 0.25    Years: 2.00     Pack years: 0.50    Types: Cigarettes    Quit date: 06/19/1974    Years since quitting: 44.5  . Smokeless tobacco: Never Used  . Tobacco comment: Quit in late 20's  Substance and Sexual Activity  . Alcohol use: No    Alcohol/week: 0.0 standard drinks  . Drug use: No  . Sexual activity: Never  Lifestyle  . Physical activity    Days per week: 0 days    Minutes per session: 0 min  . Stress: Only a little  Relationships  . Social connections    Talks on phone: More than three times a week    Gets together: Once a week    Attends religious service: More than 4 times per year    Active member of club or organization: Yes    Attends meetings of clubs or organizations: 1 to 4 times per year    Relationship status: Married  . Intimate partner violence    Fear of current or ex partner: No    Emotionally abused: No    Physically abused: No    Forced sexual activity: No  Other Topics Concern  . Not on file  Social History Narrative   Married to husband that has dementia (he lives in the skilled nursing wing) and this is a big stressor. 5 children. 10 grandchildren. 2 greatgrandchildren. All children [place]      Lives at [facility], in independent living.      Retired from multiple different Community education officer, university, Psychologist, educational.       Hobbies: Management consultant, write poetry, craft dolls      Right-handed.      No daily caffeine use.     PHYSICAL EXAM   Vitals:   12/31/18 1004  BP: 119/70  Pulse: 74  Temp: (!) 97.5 F (36.4 C)    Not recorded      There is no height or weight on file to calculate BMI.  PHYSICAL  EXAMNIATION:  Gen: NAD, conversant, well nourised, obese, well groomed                     Cardiovascular: Regular rate rhythm, no peripheral edema, warm, nontender. Eyes: Conjunctivae clear without exudates or hemorrhage Neck: Supple, no carotid bruits. Pulmonary: Clear to auscultation bilaterally   NEUROLOGICAL EXAM: MMSE - Mini Mental State Exam  12/31/2018 03/27/2017 03/27/2017  Not completed: - - (No Data)  Orientation to time '3 5 5  ' Orientation to Place '5 5 5  ' Registration '3 3 3  ' Attention/ Calculation '5 5 5  ' Recall '3 3 3  ' Language- name 2 objects '2 2 2  ' Language- repeat '1 1 1  ' Language- follow 3 step command '3 3 3  ' Language- read & follow direction '1 1 1  ' Write a sentence '1 1 1  ' Copy design '1 1 1  ' Total score '28 30 30  ' animal naming 14   CRANIAL NERVES: CN II: Visual fields are full to confrontation.  Pupils are round equal and briskly reactive to light. CN III, IV, VI: extraocular movement are normal. No ptosis. CN V: Facial sensation is intact to pinprick in all 3 divisions bilaterally. Corneal responses are intact.  CN VII: Face is symmetric with normal eye closure and smile. CN VIII: Hearing is normal to rubbing fingers CN IX, X: Palate elevates symmetrically. Phonation is normal. CN XI: Head turning and shoulder shrug are intact CN XII: Tongue is midline with normal movements and no atrophy.  MOTOR: Moving bilateral upper and lower extremity without difficulty, there is no resting tremor, no significant rigidity or bradykinesia noticed.  REFLEXES: Reflexes are hypoactive and symmetric at the biceps, triceps, knees, and ankles. Plantar responses are flexor.  SENSORY: Intact to light touch, pinprick, positional sensation and vibratory sensation are intact in fingers and toes.  COORDINATION: Rapid alternating movements and fine finger movements are intact. There is no dysmetria on finger-to-nose and heel-knee-shin.    GAIT/STANCE: Deferred, she sees in electronic wheelchair   DIAGNOSTIC DATA (LABS, IMAGING, TESTING) - I reviewed patient records, labs, notes, testing and imaging myself where available.   ASSESSMENT AND PLAN  RALYNN SAN is a 85 y.o. female   Recurrent episodes of confusion or visual hallucination following her fall on Nov 14, 2018   Differentiation diagnosis including complex  partial seizure  Proceed with MRI of the brain  EEG  Start lamotrigine titrating to 100 mg twice a day  Marcial Pacas, M.D. Ph.D.  [facility] [address] [place], [place] [address] Ph: [phone] Fax: [phone]  CC: Virgie Dad, MD

## 2019-01-06 ENCOUNTER — Non-Acute Institutional Stay (SKILLED_NURSING_FACILITY): Payer: Medicare Other | Admitting: Internal Medicine

## 2019-01-06 ENCOUNTER — Encounter: Payer: Self-pay | Admitting: Internal Medicine

## 2019-01-06 DIAGNOSIS — E669 Obesity, unspecified: Secondary | ICD-10-CM | POA: Diagnosis not present

## 2019-01-06 DIAGNOSIS — E039 Hypothyroidism, unspecified: Secondary | ICD-10-CM

## 2019-01-06 DIAGNOSIS — E662 Morbid (severe) obesity with alveolar hypoventilation: Secondary | ICD-10-CM

## 2019-01-06 DIAGNOSIS — I5032 Chronic diastolic (congestive) heart failure: Secondary | ICD-10-CM

## 2019-01-06 DIAGNOSIS — R441 Visual hallucinations: Secondary | ICD-10-CM | POA: Diagnosis not present

## 2019-01-06 DIAGNOSIS — E1169 Type 2 diabetes mellitus with other specified complication: Secondary | ICD-10-CM

## 2019-01-06 DIAGNOSIS — B342 Coronavirus infection, unspecified: Secondary | ICD-10-CM | POA: Diagnosis not present

## 2019-01-06 NOTE — Progress Notes (Signed)
Location:  [facility] Room Number: 45 Place of Service:  SNF (31) Provider:Kenyanna Grzesiak Rene Kocher, MD   Virgie Dad, MD  Patient Care Team: Virgie Dad, MD as PCP - General (Internal Medicine) Minus Breeding, MD as PCP - Cardiology (Cardiology) Azucena Fallen, MD as Consulting Physician (Obstetrics and Gynecology) Chesley Mires, MD as Consulting Physician (Pulmonary Disease) Mast, Man X, NP as Nurse Practitioner (Internal Medicine) Christin Fudge, MD as Consulting Physician (Surgery)  Extended Emergency Contact Information Primary Emergency Contact: Boxman,Lee Address: [address]          [place], [place] [address] Johnnette L[place] of [place] Phone: [phone] Mobile Phone: [phone] Relation: Daughter Secondary Emergency Contact: Gergle,Chris  [place] Mobile Phone: [phone] Relation: Daughter  Code Status: Full code  Goals of care: Advanced Directive information Advanced Directives 01/06/2019  Does Patient Have a Medical Advance Directive? Yes  Type of Advance Directive Out of facility DNR (pink MOST or yellow form);Living will;Healthcare Power of Attorney  Does patient want to make changes to medical advance directive? No - Patient declined  Copy of [place] in Chart? Yes - validated most recent copy scanned in chart (See row information)  Would patient like information on creating a medical advance directive? -  Pre-existing out of facility DNR order (yellow form or pink MOST form) Yellow form placed in chart (order not valid for inpatient use);Pink MOST form placed in chart (order not valid for inpatient use)     Chief Complaint  Patient presents with   Medical Management of Chronic Issues    routine visit/ medication review     HPI:  Pt is a 85 y.o. female seen today for medical management of chronic diseases.    Patient has h/o Hypertension, Chronic LE edema with Diastolic CHF and Lymphedema,  Anxiety, B12 def,OSA but does not use CPAP only Oxygen , Diabetes Type 2 Her Acute problems Episodes of Confusion Patient episodes of confusion with visual hallucination.  She had a negative CT scan of her head.  And was started on Seroquel.  Since then she had started feeling better.   She was referred to neurology.  Per neurology they think that probably patient has partial complex seizures.  She was started on Lamictal.  Her EEG and MRI are pending Over the weekend patient  refused to take her Lamictal says it was causing her to have more side effects.  She states she does not think she is having seizures.  She thinks the Seroquel has calm her down.  She still has these episodes in which she gets confused but no visual hallucinations.  And she says she knows how to deal with them.  Diarrhea Patient had work-up by GI.  All her stools were negative.  They were positive for lactoferrin was positive.  At this time plan is to just try as needed Questran.  Patient feels much better with no more Loose stools Hypothyroidism Her thyroid was increased due to TSH being more than 20  Overall patient is doing very well she is working with therapy is able to walk now with a walker and able to do her transfers.  [place]  Past Medical History:  Diagnosis Date   Altered mental status    ARTHRITIS, KNEES, BILATERAL    Breast CA ([facility]) 2000   s/p R lumpectomy and XRT   CHF (congestive heart failure) ([facility])  DEPRESSION    DJD (degenerative joint disease) of knee    GERD    HYPERLIPIDEMIA    HYPERTENSION    HYPOTHYROIDISM    postsurgical   Hypoxia    MIGRAINE HEADACHE    Morbid obesity (HCC)    OSA (obstructive sleep apnea) 01/17/2011 dx   Oxygen dependent    Urge incontinence    UTI (lower urinary tract infection) 12/2014   Past Surgical History:  Procedure Laterality Date   ABDOMINAL HYSTERECTOMY     ADENOIDECTOMY     BREAST BIOPSY   2000   BREAST LUMPECTOMY  2000   CATARACT EXTRACTION     CHOLECYSTECTOMY  1995   CYSTOSCOPY/URETEROSCOPY/HOLMIUM LASER Right 06/28/2015   Procedure: CYSTOSCOPY RIGHT RETROGRAD RIGHT URETEROSCOPY/HOLMIUM LASER WITH RIGHT STENT PLACEMENT;  Surgeon: Alexis Frock, MD;  Location: [facility];  Service: Urology;  Laterality: Right;   PARATHYROIDECTOMY     2-3 removed   REPLACEMENT TOTAL KNEE BILATERAL  1999, 2005   Right leg femur repaired     THYROIDECTOMY     TONSILLECTOMY     TUBAL LIGATION      Allergies  Allergen Reactions   Azithromycin Itching   Beta Adrenergic Blockers Other (See Comments)    Depression    Ciprofloxacin Other (See Comments)    Edgy and very jumpy     Codeine Nausea And Vomiting   Darvon [Propoxyphene] Nausea And Vomiting   Epinephrine Other (See Comments)    Rapid pulse, sweats   Klonopin [Clonazepam] Other (See Comments)    Makes her feel very suicidal    Oxycodone Other (See Comments)    Patient felt like it altered her mental status "Crazy" . Hallucinations later as well.   Paxil [Paroxetine Hydrochloride] Other (See Comments)    Severe depression    Prednisone Other (See Comments)    "makes me feel really bad"   Propoxyphene Hcl Nausea And Vomiting   Tape Itching   Torsemide Other (See Comments)    Patient stated that she doesn't recall the reaction   Tramadol Other (See Comments)    Feels "jittery"   Vicodin [Hydrocodone-Acetaminophen] Other (See Comments)    Hallucinations   Meloxicam Diarrhea   Other Rash    Polyester sheets "cause me a rash"   Vancomycin Rash    Localized rash related to infusion rate    Outpatient Encounter Medications as of 01/06/2019  Medication Sig   acetaminophen (TYLENOL) 325 MG tablet Take 650 mg by mouth every 8 (eight) hours as needed for mild pain or moderate pain.    aspirin 81 MG tablet Take 81 mg by mouth daily.    calcium citrate-vitamin D (CITRACAL+D) 315-200 MG-UNIT tablet Take  1 tablet by mouth daily. $RemoveBefo'200mg'SMwzCvdpijV$  tablet daily   cetirizine (ZYRTEC) 10 MG chewable tablet Chew 10 mg by mouth daily as needed for allergies.    cholestyramine (QUESTRAN) 4 g packet Take 1 packet (4 g total) by mouth daily. Mix in juice or water once daily in the morning 2 hours away from any other medications   cyanocobalamin 2000 MCG tablet Take 2,000 mcg by mouth daily.   lamoTRIgine (LAMICTAL) 100 MG tablet Take 1 tablet (100 mg total) by mouth 2 (two) times daily.   lamoTRIgine (LAMICTAL) 25 MG tablet 1 tablet twice a day for the first week 2 tablets twice a day for the second week 3 tablets twice a day for the third week 4 tablets twice a day for the fourth week  For total  of 140 tablets  After finish titration with small dose of lamotrigine 25 mg, change to lamotrigine 100 mg twice a day   levothyroxine (SYNTHROID) 175 MCG tablet Take 175 mcg by mouth daily before breakfast.   loperamide (IMODIUM) 2 MG capsule Take 2 mg by mouth daily. Every morning before breakfast   Melatonin 5 MG TABS Take 5 mg by mouth at bedtime.   metolazone (ZAROXOLYN) 2.5 MG tablet Take 2.5 mg by mouth daily.   omeprazole (PRILOSEC) 20 MG capsule Take 20 mg by mouth 2 (two) times a day.    ondansetron (ZOFRAN) 4 MG tablet TAKE (1) TABLET BY MOUTH EVERY SIX HOURS AS NEEDED FOR NAUSEA.   OXYGEN Inhale 2 L into the lungs continuous. KEEP O2 SATS >= 90%. Every Shift   polyvinyl alcohol (LIQUIFILM TEARS) 1.4 % ophthalmic solution Place 2 drops into both eyes 4 (four) times daily.   potassium chloride (MICRO-K) 10 MEQ CR capsule Take 40 mEq by mouth daily. Take 4 capsules   QUEtiapine (SEROQUEL) 25 MG tablet Take 12.5 mg by mouth at bedtime.   saccharomyces boulardii (FLORASTOR) 250 MG capsule Take 250 mg by mouth daily.   sertraline (ZOLOFT) 100 MG tablet Take 100 mg by mouth daily.   spironolactone (ALDACTONE) 25 MG tablet Take 25 mg by mouth daily.   [DISCONTINUED] levothyroxine (SYNTHROID) 150  MCG tablet Take 150 mcg by mouth daily before breakfast. Patient is to use name brand medication (Synthroid only)   No facility-administered encounter medications on file as of 01/06/2019.     Review of Systems  Constitutional: Negative.   All other systems reviewed and are negative.   Immunization History  Administered Date(s) Administered   Influenza Split 03/20/2011   Influenza Whole 03/19/2009, 03/19/2012   Influenza, High Dose Seasonal PF 03/16/2018   Influenza,inj,Quad PF,6+ Mos 03/19/2013   Influenza-Unspecified 03/03/2014, 03/15/2015, 03/30/2016, 04/05/2017   Meningococcal Polysaccharide 03/15/2015   PPD Test 03/30/2016   Pneumococcal Conjugate-13 03/12/2014   Pneumococcal Polysaccharide-23 06/19/2006, 03/15/2015   Td 10/04/2009   Tdap 06/19/2005, 11/14/2018   Zoster 06/19/2010   Zoster Recombinat (Shingrix) 01/28/2018, 04/22/2018, 05/22/2018   Pertinent  Health Maintenance Due  Topic Date Due   FOOT EXAM  09/08/1940   OPHTHALMOLOGY EXAM  09/08/1940   URINE MICROALBUMIN  09/08/1940   DEXA SCAN  09/07/1997   HEMOGLOBIN A1C  11/19/2018   INFLUENZA VACCINE  01/18/2019   PNA vac Low Risk Adult  Completed   Fall Risk  08/30/2017 06/07/2017 12/22/2016 04/06/2016 11/27/2015  Falls in the past year? No No Yes Yes No  Number falls in past yr: - - 1 1 -  Injury with Fall? - - No Yes -  Risk for fall due to : - - - - -   Functional Status Survey:    Vitals:   01/06/19 1220  BP: (!) 146/84  Pulse: 78  Resp: 20  Temp: (!) 97.3 F (36.3 C)  SpO2: 96%  Weight: 271 lb (122.9 kg)  Height: $Remove'5\' 2"'OjdsFPu$  (1.575 m)   Body mass index is 49.57 kg/m. Physical Exam Vitals signs reviewed.  Constitutional:      Appearance: Normal appearance. She is obese.  HENT:     Head: Normocephalic.     Nose: Nose normal.     Mouth/Throat:     Mouth: Mucous membranes are moist.     Pharynx: Oropharynx is clear.  Eyes:     Pupils: Pupils are equal, round, and reactive to  light.  Neck:  Musculoskeletal: Neck supple.  Cardiovascular:     Rate and Rhythm: Normal rate and regular rhythm.     Pulses: Normal pulses.  Pulmonary:     Effort: Pulmonary effort is normal. No respiratory distress.     Breath sounds: Normal breath sounds. No wheezing.  Abdominal:     General: Abdomen is flat. Bowel sounds are normal. There is no distension.     Palpations: Abdomen is soft.     Tenderness: There is no abdominal tenderness.  Musculoskeletal:     Comments: Chronic Edema Bilateral  Skin:    General: Skin is warm and dry.  Neurological:     General: No focal deficit present.     Mental Status: She is alert and oriented to person, place, and time.     Comments: Can walk with Walker and able to do her transfers  Psychiatric:        Mood and Affect: Mood normal.        Thought Content: Thought content normal.        Judgment: Judgment normal.     Labs reviewed: Recent Labs    01/10/18 0000  05/20/18 0000  08/09/18 0533 08/10/18 0535  11/06/18 1318 11/28/18 12/17/18  NA 141   < > 139   < > 138 139   < > 140 141 140  K 3.7   < > 3.6   < > 3.8 4.1   < > 3.9 3.6 4.0  CL 99   < > 100   < > 99 100  --  100  --   --   CO2 33*   < > 31   < > 30 31  --  32  --   --   GLUCOSE 86   < > 114*   < > 133* 103*  --  95  --   --   BUN 22   < > 21   < > 23 10   < > 7* 14 14  CREATININE 0.89*   < > 0.92*   < > 0.97 0.75   < > 0.99 0.9 0.9  CALCIUM 8.1*   < > 8.1*   < > 8.1* 7.5*  --  7.9*  --   --   MG 1.7  --  1.6  --   --   --   --   --   --   --    < > = values in this interval not displayed.   Recent Labs    04/23/18 1519 05/20/18 0000 08/10/18 0535 10/30/18 11/06/18 1318  AST $Re'20 16 21 14 'lDE$ 13*  ALT $Re'12 11 11 8 10  'zYe$ ALKPHOS 84  --  79 78 69  BILITOT 0.7 0.5 0.9  --  0.6  PROT 6.6 6.4 5.7*  --  5.7*  ALBUMIN 3.0*  --  2.8*  --  2.7*   Recent Labs    07/05/18 1248  08/09/18 0533 08/10/18 0621 10/30/18 11/06/18 1318  WBC 6.3  --  7.3 5.4 7.3 6.6  NEUTROABS  4.7  --  4.6  --   --  4.7  HGB 12.9   < > 12.2 11.5* 12.6 11.7*  HCT 41.0   < > 39.1 38.6 39 37.7  MCV 92.3  --  96.3 98.2  --  92.2  PLT 142*  --  129* 133* 132* 140*   < > = values in this interval not displayed.   Lab Results  Component  Value Date   TSH 21.20 (A) 12/26/2018   Lab Results  Component Value Date   HGBA1C 6.5 (H) 05/20/2018   Lab Results  Component Value Date   CHOL 153 03/05/2018   HDL 45 (L) 03/05/2018   LDLCALC 86 03/05/2018   LDLDIRECT 99.4 03/13/2012   TRIG 127 03/05/2018   CHOLHDL 3.4 03/05/2018    Significant Diagnostic Results in last 30 days:  No results found.  Assessment/Plan ? Partial Complex Seizures Was started on Lamictal by neurology At this time patient is refusing to take it due to side effects We will discontinue it Continue on Seroquel Patient's EEG and MRI are pending She is going to follow-up with neurology after that  Chronic diarrhea Her worker was negative for GI She was started on Questran Patient is doing well Hypothyroidism Increased her Synthroid to 175 Repeat TSH is pending Depression Continue on Zoloft and Seroquel Chronic diastolic CHF On metolazone and Aldactone Renal function is stable and her weight is stable  Obesity hypoventilation syndrome (HCC) Does not use CPAP anymore On Chronic Oxygen at night   Gastroesophageal reflux disease without esophagitis On Omeprazole  Diabetes mellitus type 2 in obese (HCC) Not on Any Meds Last A1C was 6.5  CKD (chronic kidney disease) stage 3, GFR 30-59 ml/min (HCC) Repeat BMP on Aldactone was stable  Vitamin B12 deficiency Continue Supplement Last Level in 01/20 Was WNL   Family/ staff Communication:   Labs/tests ordered:    Total time spent in this patient care encounter was 45  minutes; greater than 50% of the visit spent counseling patient and staff, reviewing records , Labs and coordinating care for problems addressed at this encounter.

## 2019-01-08 ENCOUNTER — Ambulatory Visit (INDEPENDENT_AMBULATORY_CARE_PROVIDER_SITE_OTHER): Payer: Medicare Other | Admitting: Neurology

## 2019-01-08 ENCOUNTER — Other Ambulatory Visit: Payer: Self-pay

## 2019-01-08 DIAGNOSIS — R410 Disorientation, unspecified: Secondary | ICD-10-CM | POA: Diagnosis not present

## 2019-01-08 DIAGNOSIS — R441 Visual hallucinations: Secondary | ICD-10-CM

## 2019-01-09 ENCOUNTER — Telehealth: Payer: Self-pay | Admitting: Physician Assistant

## 2019-01-09 NOTE — Telephone Encounter (Signed)
She will follow this advise. She declines to schedule an appointment yet, but agrees to call with an update.

## 2019-01-09 NOTE — Telephone Encounter (Signed)
Given its only one time change, will hold off increasing the dose of cholestyramine.  Advise patient to continue cholestyramine once daily.  Follow-up with Amy next available appointment.

## 2019-01-09 NOTE — Progress Notes (Signed)
Reviewed and agree with documentation and assessment and plan. K. Veena Aadit Hagood , MD   

## 2019-01-09 NOTE — Telephone Encounter (Signed)
Patient seen by Nicoletta Ba, PA in June. She was having diarrhea with incontinence and unpredictability. Infectious process ruled out. She started Florastor daily, Imodium 2 daily and Cholestyramine daily. She has improved with more predictability and more formed stool. She calls because she ate popcorn yesterday and has developed loose stool. States they are not diarrhea but she feels the popcorn upset her bowels. This is only today. Again she had had improvement.  Should she increase the Questran to BID?

## 2019-01-15 DIAGNOSIS — Z03818 Encounter for observation for suspected exposure to other biological agents ruled out: Secondary | ICD-10-CM | POA: Diagnosis not present

## 2019-01-17 ENCOUNTER — Telehealth: Payer: Self-pay | Admitting: Physician Assistant

## 2019-01-17 NOTE — Telephone Encounter (Signed)
Spoke with patient. Reviewed her medications. She is taking the probiotic, cholestyramine daily as directed. She is using 1 Imodium a day. She does not see a pattern or a trigger for the loose bowel movement. These occur 2 to 3 times every day. They are not painful but the are urgent. She will take an extra Imodium (total of 2 as stated in the office note) and await further instruction. Afebrile. No bloody stools. No nausea.

## 2019-01-17 NOTE — Telephone Encounter (Signed)
Pt is still dealing with diarrhea, she would like to know if Amy has any other suggestion, she states that medication that she was put on helped a bit but has not resolved the problem completely. Pls call her.

## 2019-01-20 ENCOUNTER — Other Ambulatory Visit: Payer: Self-pay

## 2019-01-20 MED ORDER — CHOLESTYRAMINE 4 G PO PACK: 4.0000 g | PACK | Freq: Two times a day (BID) | ORAL | 8 refills | Status: DC

## 2019-01-20 NOTE — Telephone Encounter (Signed)
Patient agrees to this plan of care. She is presently living at a SNF. Dr Lyndel Safe is her PCP. Order faxed to Dr Steve Rattler office at [phone].

## 2019-01-20 NOTE — Telephone Encounter (Signed)
We could let her try increasing Questran to twice daily but would need to be at least 2 hours away from any other meds

## 2019-01-22 DIAGNOSIS — Z1159 Encounter for screening for other viral diseases: Secondary | ICD-10-CM | POA: Diagnosis not present

## 2019-01-24 ENCOUNTER — Telehealth: Payer: Self-pay | Admitting: Physician Assistant

## 2019-01-24 NOTE — Telephone Encounter (Signed)
No answer and no voice mail.

## 2019-01-25 NOTE — Procedures (Signed)
   HISTORY: 85 years old female, presented with altered mental status  TECHNIQUE:  This is a routine 16 channel EEG recording with one channel devoted to a limited EKG recording.  It was performed during wakefulness, drowsiness and asleep.  Hyperventilation and photic stimulation were performed as activating procedures.  There are minimum muscle and movement artifact noted.  Upon maximum arousal, posterior dominant waking rhythm consistent of rhythmic alpha range activity, with frequency of $RemoveBefo'9Hz'LmdQCLAOkXo$ . Activities are symmetric over the bilateral posterior derivations and attenuated with eye opening.  Hyperventilation produced mild/moderate buildup with higher amplitude and the slower activities noted.  Photic stimulation did not alter the tracing.  During EEG recording, patient developed drowsiness and no deeper stage of sleep was achieved.  During EEG recording, there was no epileptiform discharge noted.  EKG demonstrate sinus rhythm, with heart rate of  CONCLUSION: This is a  normal awake EEG.  There is no electrodiagnostic evidence of epileptiform discharge.  Marcial Pacas, M.D. Ph.D.  [facility] [place], [place] [address] Phone: [phone] Fax:      [phone]

## 2019-01-29 ENCOUNTER — Non-Acute Institutional Stay (SKILLED_NURSING_FACILITY): Payer: Medicare Other | Admitting: Nurse Practitioner

## 2019-01-29 ENCOUNTER — Encounter: Payer: Self-pay | Admitting: Nurse Practitioner

## 2019-01-29 DIAGNOSIS — I10 Essential (primary) hypertension: Secondary | ICD-10-CM

## 2019-01-29 DIAGNOSIS — Z1159 Encounter for screening for other viral diseases: Secondary | ICD-10-CM | POA: Diagnosis not present

## 2019-01-29 DIAGNOSIS — K219 Gastro-esophageal reflux disease without esophagitis: Secondary | ICD-10-CM | POA: Diagnosis not present

## 2019-01-29 DIAGNOSIS — R441 Visual hallucinations: Secondary | ICD-10-CM

## 2019-01-29 DIAGNOSIS — K589 Irritable bowel syndrome without diarrhea: Secondary | ICD-10-CM

## 2019-01-29 DIAGNOSIS — E039 Hypothyroidism, unspecified: Secondary | ICD-10-CM | POA: Diagnosis not present

## 2019-01-29 DIAGNOSIS — I5032 Chronic diastolic (congestive) heart failure: Secondary | ICD-10-CM | POA: Diagnosis not present

## 2019-01-29 NOTE — Progress Notes (Addendum)
Location:  [facility] Room Number: 20 Place of Service:  SNF [id number]) Provider:  Caroleena Paolini, Arna Snipe NP  Mahlon Gammon, MD  Patient Care Team: Mahlon Gammon, MD as PCP - General (Internal Medicine) Rollene Rotunda, MD as PCP - Cardiology (Cardiology) Shea Evans, MD as Consulting Physician (Obstetrics and Gynecology) Coralyn Helling, MD as Consulting Physician (Pulmonary Disease) Aleric Froelich X, NP as Nurse Practitioner (Internal Medicine) Evlyn Kanner, MD as Consulting Physician (Surgery)  Extended Emergency Contact Information Primary Emergency Contact: Boxman,Lee Address: [address]          [place], [place] [address] [place] Home Phone: [phone] Mobile Phone: [phone] Relation: Daughter Secondary Emergency Contact: Theodis Shove [place] Mobile Phone: [phone] Relation: Daughter  Code Status:  Full Code Goals of care: Advanced Directive information Advanced Directives 01/29/2019  Does Patient Have a Medical Advance Directive? Yes  Type of Estate agent of [place];Living will  Does patient want to make changes to medical advance directive? No - Patient declined  Copy of Healthcare Power of Attorney in Chart? Yes - validated most recent copy scanned in chart (See row information)  Would patient like information on creating a medical advance directive? -  Pre-existing out of facility DNR order (yellow form or pink MOST form) -     Chief Complaint  Patient presents with  . Medical Management of Chronic Issues    HPI:  Pt is a 85 y.o. female seen today for medical management of chronic diseases.    The patient resides in [facility] for safety and care assistance, she is ambulating with walker with SBA. Diarrhea, s/p GI evaluation, improved on Questran bid, prn Imodium. No further hallucination, f/u Neurology, pending MRI, on Sertraline 100mg  qd, Seroquel 12.5mg  qd. Chronic CHF/ edema BLE,  stable on Spironolactone 25mg  qd, Metolazone 2.5mg  qd. GERD, stable, on Omeprazole 20mg  bid. Hypothyroidism, on Levothyroxine qd, TSH 21 12/26/18. HTN, blood pressure is elevated, she denied headache, change of vision, dizziness, chest pain/pressure, or palpitation.    Past Medical History:  Diagnosis Date  . Altered mental status   . ARTHRITIS, KNEES, BILATERAL   . Breast CA (HCC) 2000   s/p R lumpectomy and XRT  . CHF (congestive heart failure) (HCC)   . DEPRESSION   . DJD (degenerative joint disease) of knee   . GERD   . HYPERLIPIDEMIA   . HYPERTENSION   . HYPOTHYROIDISM    postsurgical  . Hypoxia   . MIGRAINE HEADACHE   . Morbid obesity (HCC)   . OSA (obstructive sleep apnea) 01/17/2011 dx  . Oxygen dependent   . Urge incontinence   . UTI (lower urinary tract infection) 12/2014   Past Surgical History:  Procedure Laterality Date  . ABDOMINAL HYSTERECTOMY    . ADENOIDECTOMY    . BREAST BIOPSY  2000  . BREAST LUMPECTOMY  2000  . CATARACT EXTRACTION    . CHOLECYSTECTOMY  1995  . CYSTOSCOPY/URETEROSCOPY/HOLMIUM LASER Right 06/28/2015   Procedure: CYSTOSCOPY RIGHT RETROGRAD RIGHT URETEROSCOPY/HOLMIUM LASER WITH RIGHT STENT PLACEMENT;  Surgeon: Sebastian Ache, MD;  Location: [facility];  Service: Urology;  Laterality: Right;  . PARATHYROIDECTOMY     2-3 removed  . REPLACEMENT TOTAL KNEE BILATERAL  1999, 2005  . Right leg femur repaired    . THYROIDECTOMY    . TONSILLECTOMY    . TUBAL LIGATION      Allergies  Allergen Reactions  . Azithromycin Itching  . Beta  Adrenergic Blockers Other (See Comments)    Depression   . Ciprofloxacin Other (See Comments)    Edgy and very jumpy    . Codeine Nausea And Vomiting  . Darvon [Propoxyphene] Nausea And Vomiting  . Epinephrine Other (See Comments)    Rapid pulse, sweats  . Klonopin [Clonazepam] Other (See Comments)    Makes her feel very suicidal   . Oxycodone Other (See Comments)    Patient felt like it altered her mental  status "Crazy" . Hallucinations later as well.  [name] Paxil [Paroxetine Hydrochloride] Other (See Comments)    Severe depression   . Prednisone Other (See Comments)    "makes me feel really bad"  . Propoxyphene Hcl Nausea And Vomiting  . Tape Itching  . Torsemide Other (See Comments)    Patient stated that she doesn't recall the reaction  . Tramadol Other (See Comments)    Feels "jittery"  . Vicodin [Hydrocodone-Acetaminophen] Other (See Comments)    Hallucinations  . Meloxicam Diarrhea  . Other Rash    Polyester sheets "cause me a rash"  . Vancomycin Rash    Localized rash related to infusion rate    Outpatient Encounter Medications as of 01/29/2019  Medication Sig  . acetaminophen (TYLENOL) 325 MG tablet Take 650 mg by mouth every 8 (eight) hours as needed for mild pain or moderate pain.   [name] aspirin 81 MG tablet Take 81 mg by mouth daily.   . calcium citrate-vitamin D (CITRACAL+D) 315-200 MG-UNIT tablet Take 1 tablet by mouth daily. 200mg  tablet daily  . cetirizine (ZYRTEC) 10 MG chewable tablet Chew 10 mg by mouth daily as needed for allergies.   . cholestyramine (QUESTRAN) 4 g packet Take 1 packet (4 g total) by mouth 2 (two) times daily. Mix in juice or water once daily in the morning 2 hours away from any other medications  . cyanocobalamin 2000 MCG tablet Take 2,000 mcg by mouth daily.  [name] levothyroxine (SYNTHROID) 175 MCG tablet Take 175 mcg by mouth daily before breakfast.  . loperamide (IMODIUM) 2 MG capsule Take 2 mg by mouth daily as needed. Every morning before breakfast   . Melatonin 5 MG TABS Take 5 mg by mouth at bedtime.  . metolazone (ZAROXOLYN) 2.5 MG tablet Take 2.5 mg by mouth daily.  [name] omeprazole (PRILOSEC) 20 MG capsule Take 20 mg by mouth 2 (two) times a day.   . ondansetron (ZOFRAN) 4 MG tablet TAKE (1) TABLET BY MOUTH EVERY SIX HOURS AS NEEDED FOR NAUSEA.  [name] OXYGEN Inhale 2 L into the lungs continuous. KEEP O2 SATS >= 90%. Every Shift  . polyvinyl alcohol  (LIQUIFILM TEARS) 1.4 % ophthalmic solution Place 2 drops into both eyes 4 (four) times daily.  . potassium chloride (MICRO-K) 10 MEQ CR capsule Take 40 mEq by mouth daily. Take 4 capsules  . QUEtiapine (SEROQUEL) 25 MG tablet Take 12.5 mg by mouth at bedtime.  . saccharomyces boulardii (FLORASTOR) 250 MG capsule Take 250 mg by mouth daily.  . sertraline (ZOLOFT) 100 MG tablet Take 100 mg by mouth at bedtime.   [name] spironolactone (ALDACTONE) 25 MG tablet Take 25 mg by mouth daily.  . [DISCONTINUED] lamoTRIgine (LAMICTAL) 100 MG tablet Take 1 tablet (100 mg total) by mouth 2 (two) times daily.  . [DISCONTINUED] lamoTRIgine (LAMICTAL) 25 MG tablet 1 tablet twice a day for the first week 2 tablets twice a day for the second week 3 tablets twice a day for the third week 4 tablets  twice a day for the fourth week  For total of 140 tablets  After finish titration with small dose of lamotrigine 25 mg, change to lamotrigine 100 mg twice a day   No facility-administered encounter medications on file as of 01/29/2019.    ROS was provided with assistance of staff Review of Systems  Constitutional: Positive for unexpected weight change. Negative for activity change, appetite change, chills, diaphoresis, fatigue and fever.       #4Ibs weight loss in the past month, desirable.   HENT: Positive for hearing loss. Negative for congestion and voice change.   Respiratory: Negative for cough, shortness of breath and wheezing.   Cardiovascular: Positive for leg swelling. Negative for chest pain and palpitations.  Gastrointestinal: Negative for abdominal distention, constipation, diarrhea, nausea and vomiting.  Genitourinary: Negative for difficulty urinating, dysuria and urgency.  Musculoskeletal: Positive for gait problem.  Skin: Negative for color change and pallor.  Neurological: Negative for dizziness, speech difficulty and headaches.       Memory lapses  Psychiatric/Behavioral: Negative for agitation,  behavioral problems, hallucinations and sleep disturbance. The patient is not nervous/anxious.     Immunization History  Administered Date(s) Administered  . Influenza Split 03/20/2011  . Influenza Whole 03/19/2009, 03/19/2012  . Influenza, High Dose Seasonal PF 03/16/2018  . Influenza,inj,Quad PF,6+ Mos 03/19/2013  . Influenza-Unspecified 03/03/2014, 03/15/2015, 03/30/2016, 04/05/2017  . Meningococcal Polysaccharide 03/15/2015  . PPD Test 03/30/2016  . Pneumococcal Conjugate-13 03/12/2014  . Pneumococcal Polysaccharide-23 06/19/2006, 03/15/2015  . Td 10/04/2009  . Tdap 06/19/2005, 11/14/2018  . Zoster 06/19/2010  . Zoster Recombinat (Shingrix) 01/28/2018, 04/22/2018, 05/22/2018   Pertinent  Health Maintenance Due  Topic Date Due  . FOOT EXAM  09/08/1942  . OPHTHALMOLOGY EXAM  09/08/1942  . URINE MICROALBUMIN  09/08/1942  . DEXA SCAN  09/07/1997  . HEMOGLOBIN A1C  11/19/2018  . INFLUENZA VACCINE  01/18/2019  . PNA vac Low Risk Adult  Completed   Fall Risk  08/30/2017 06/07/2017 12/22/2016 04/06/2016 11/27/2015  Falls in the past year? No No Yes Yes No  Number falls in past yr: - - 1 1 -  Injury with Fall? - - No Yes -  Risk for fall due to : - - - - -   Functional Status Survey:    Vitals:   01/29/19 1058  BP: (!) 164/90  Pulse: 92  Resp: 20  Temp: (!) 97.5 F (36.4 C)  SpO2: 92%  Weight: 267 lb (121.1 kg)  Height: 5\' 2"  (1.575 m)   Body mass index is 48.83 kg/m. Physical Exam Vitals signs and nursing note reviewed.  Constitutional:      General: She is not in acute distress.    Appearance: Normal appearance. She is obese. She is not ill-appearing, toxic-appearing or diaphoretic.  HENT:     Head: Normocephalic and atraumatic.     Nose: Nose normal.     Mouth/Throat:     Mouth: Mucous membranes are moist.  Eyes:     Extraocular Movements: Extraocular movements intact.     Conjunctiva/sclera: Conjunctivae normal.     Pupils: Pupils are equal, round, and  reactive to light.  Neck:     Musculoskeletal: Normal range of motion and neck supple.  Cardiovascular:     Rate and Rhythm: Normal rate and regular rhythm.     Heart sounds: No murmur.  Abdominal:     General: Bowel sounds are normal. There is no distension.     Palpations: Abdomen is soft.  Tenderness: There is no abdominal tenderness. There is no right CVA tenderness, left CVA tenderness, guarding or rebound.  Musculoskeletal:     Right lower leg: Edema present.     Left lower leg: Edema present.     Comments: 2+ edema BLE. Ambulates with walker for a short distance, electric scooter to go further  Skin:    General: Skin is warm and dry.     Comments: Chronic venous insufficiency skin changes BLE  Neurological:     General: No focal deficit present.     Mental Status: She is alert and oriented to person, place, and time. Mental status is at baseline.     Cranial Nerves: No cranial nerve deficit.     Motor: No weakness.     Coordination: Coordination normal.     Gait: Gait abnormal.  Psychiatric:        Mood and Affect: Mood normal.        Behavior: Behavior normal.        Thought Content: Thought content normal.     Labs reviewed: Recent Labs    05/20/18 0000  08/09/18 0533 08/10/18 0535  11/06/18 1318 11/28/18 12/17/18  NA 139   < > 138 139   < > 140 141 140  K 3.6   < > 3.8 4.1   < > 3.9 3.6 4.0  CL 100   < > 99 100  --  100  --   --   CO2 31   < > 30 31  --  32  --   --   GLUCOSE 114*   < > 133* 103*  --  95  --   --   BUN 21   < > 23 10   < > 7* 14 14  CREATININE 0.92*   < > 0.97 0.75   < > 0.99 0.9 0.9  CALCIUM 8.1*   < > 8.1* 7.5*  --  7.9*  --   --   MG 1.6  --   --   --   --   --   --   --    < > = values in this interval not displayed.   Recent Labs    04/23/18 1519 05/20/18 0000 08/10/18 0535 10/30/18 11/06/18 1318  AST 20 16 21 14  13*  ALT 12 11 11 8 10   ALKPHOS 84  --  79 78 69  BILITOT 0.7 0.5 0.9  --  0.6  PROT 6.6 6.4 5.7*  --  5.7*   ALBUMIN 3.0*  --  2.8*  --  2.7*   Recent Labs    07/05/18 1248  08/09/18 0533 08/10/18 0621 10/30/18 11/06/18 1318  WBC 6.3  --  7.3 5.4 7.3 6.6  NEUTROABS 4.7  --  4.6  --   --  4.7  HGB 12.9   < > 12.2 11.5* 12.6 11.7*  HCT 41.0   < > 39.1 38.6 39 37.7  MCV 92.3  --  96.3 98.2  --  92.2  PLT 142*  --  129* 133* 132* 140*   < > = values in this interval not displayed.   Lab Results  Component Value Date   TSH 21.20 (A) 12/26/2018   Lab Results  Component Value Date   HGBA1C 6.5 (H) 05/20/2018   Lab Results  Component Value Date   CHOL 153 03/05/2018   HDL 45 (L) 03/05/2018   LDLCALC 86 03/05/2018   LDLDIRECT 99.4 03/13/2012  TRIG 127 03/05/2018   CHOLHDL 3.4 03/05/2018    Significant Diagnostic Results in last 30 days:  Mr Brain Wo Contrast  Result Date: 02/03/2019  [facility] [address] [place], [place] [address] [phone] NEUROIMAGING REPORT STUDY DATE: 02/03/2019 PATIENT NAME: ALIYAAH AHLIN DOB: 10-Feb-1931 MRN: [id number] EXAM: MRI Brain without contrast ORDERING CLINICIAN: Levert Feinstein MD, PhD CLINICAL HISTORY: 85 year old woman with confusion and visual hallucinations COMPARISON FILMS: CT 03/17/2016 TECHNIQUE: MRI of the brain without contrast was obtained utilizing 5 mm axial slices with T1, T2, T2 flair, SWI and diffusion weighted views.  T1 sagittal and T2 coronal views were obtained. CONTRAST: none IMAGING SITE: [facility], [address] [place], [place] FINDINGS: On sagittal images, the spinal cord is imaged caudally to C3 and is normal in caliber.   The contents of the posterior fossa are of normal size and position.   The pituitary gland and optic chiasm appear normal.   There is cortical atrophy that is most pronounced in the parietal lobes and the posterior frontal lobes.  Significant medial temporal lobe atrophy is not noted.  The ventricles are normal in size for age and without distortion.  There are no abnormal  extra-axial collections of fluid.  Mild T2 hyperintensity is noted within the pons.  The cerebellum appears normal.   The deep gray matter appears normal.  In the hemispheres, there are scattered T2/flair hyperintense foci in the subcortical and deep white matter.  None of these appear to be acute.  Diffusion weighted images are normal.  Susceptibility weighted images are normal.  The VIIth/VIIIth nerve complex appears normal.  There have been bilateral lens replacements.  The orbits are otherwise normal.  The mastoid air cells appear normal.  The paranasal sinuses appear normal.  Flow voids are identified within the major intracerebral arteries.     This MRI of the brain without contrast shows the following: 1.   There is moderate atrophy of the parietal lobes and posterior frontal lobes with relative sparing elsewhere.  Isolated parietal atrophy can be seen in some patients with Alzheimer's disease and in posterior cortical atrophy.  2.   Moderate chronic microvascular ischemic change. 3.   There are no acute findings. INTERPRETING PHYSICIAN: Richard A. Epimenio Foot, MD, PhD, FAAN Certified in  Neuroimaging by [employer]    Assessment/Plan Essential hypertension Elevated Bp, will add Metoprolol 12.5mg  bid, VS q shift x 1 week.  01/30/19 nurse reported Bp normalized prior starting Metoprolol, Hx of BCB intolerance, dc Metoprolol, monitor Bp  Hypothyroidism (acquired) Levothyroxine was increased to qd, last TSH 21 12/26/18, pending f/u TSH  Chronic diastolic CHF (congestive heart failure) (HCC) Compensated clinically, chronic edema BLE, continue Spironolactone 25mg  qd, Metolazone 2.5mg  qd.  Acid reflux Stable, continue Omeprazole 20mg  bid  IBS (irritable bowel syndrome) s/p GI evaluation, improved, continue Questran bid, prn Imodium.  Visual hallucinations No further hallucination, f/u Neurology, pending MRI, continue Sertraline 100mg  qd, Seroquel 12.5mg  qd.     Family/  staff Communication:plan of care reviewed with the patient and charge nurse.   Labs/tests ordered: none  Time spend 25 minutes.

## 2019-01-29 NOTE — Assessment & Plan Note (Signed)
No further hallucination, f/u Neurology, pending MRI, continue Sertraline 100mg  qd, Seroquel 12.5mg  qd.

## 2019-01-29 NOTE — Assessment & Plan Note (Signed)
s/p GI evaluation, improved, continue Questran bid, prn Imodium.

## 2019-01-29 NOTE — Assessment & Plan Note (Signed)
Stable, continue Omeprazole 20mg  bid

## 2019-01-29 NOTE — Assessment & Plan Note (Signed)
Levothyroxine was increased to 156mcg qd, last TSH 21 12/26/18, pending f/u TSH

## 2019-01-29 NOTE — Assessment & Plan Note (Addendum)
Elevated Bp, will add Metoprolol 12.$RemoveBeforeDEI'5mg'[id number]$  bid, VS q shift x 1 week.  01/30/19 nurse reported Bp normalized prior starting Metoprolol, Hx of BCB intolerance, dc Metoprolol, monitor Bp

## 2019-01-29 NOTE — Assessment & Plan Note (Signed)
Compensated clinically, chronic edema BLE, continue Spironolactone 25mg  qd, Metolazone 2.5mg  qd.

## 2019-01-31 NOTE — Telephone Encounter (Signed)
Spoke with the patient. @ doses of Cholestyramine has helped her with control and has decreased the liquid nature of the stools. She is pleased.

## 2019-02-03 ENCOUNTER — Ambulatory Visit
Admission: RE | Admit: 2019-02-03 | Discharge: 2019-02-03 | Disposition: A | Discharge status: Home / Self Care | Payer: Medicare Other | Source: Ambulatory Visit | Attending: Neurology | Admitting: Neurology

## 2019-02-03 ENCOUNTER — Other Ambulatory Visit: Payer: Self-pay

## 2019-02-03 DIAGNOSIS — R441 Visual hallucinations: Secondary | ICD-10-CM

## 2019-02-03 DIAGNOSIS — R410 Disorientation, unspecified: Secondary | ICD-10-CM

## 2019-02-04 ENCOUNTER — Telehealth: Payer: Self-pay | Admitting: Neurology

## 2019-02-04 NOTE — Telephone Encounter (Signed)
I was able to speak with her daughter, Richrd Prime (on [place]). She verbalized understanding of her mother's MRI results.

## 2019-02-04 NOTE — Telephone Encounter (Signed)
Please call patient, MRI of brain showed moderate atrophy of parietal and posterior frontal lobes,  Moderate atrophy.   IMPRESSION: This MRI of the brain without contrast shows the following: 1.   There is moderate atrophy of the parietal lobes and posterior frontal lobes with relative sparing elsewhere.  Isolated parietal atrophy can be seen in some patients with Alzheimer's disease and in posterior cortical atrophy.   2.   Moderate chronic microvascular ischemic change. 3.   There are no acute findings.

## 2019-02-10 ENCOUNTER — Encounter: Payer: Self-pay | Admitting: Internal Medicine

## 2019-02-10 ENCOUNTER — Non-Acute Institutional Stay (SKILLED_NURSING_FACILITY): Payer: Medicare Other | Admitting: Internal Medicine

## 2019-02-10 DIAGNOSIS — E039 Hypothyroidism, unspecified: Secondary | ICD-10-CM | POA: Diagnosis not present

## 2019-02-10 DIAGNOSIS — I5032 Chronic diastolic (congestive) heart failure: Secondary | ICD-10-CM | POA: Diagnosis not present

## 2019-02-10 DIAGNOSIS — I10 Essential (primary) hypertension: Secondary | ICD-10-CM

## 2019-02-10 DIAGNOSIS — R441 Visual hallucinations: Secondary | ICD-10-CM

## 2019-02-10 NOTE — Progress Notes (Signed)
Location:  [facility] Room Number: 45 Place of Service:  SNF [phone])  Provider: Veleta Miners  MD  PCP: Virgie Dad, MD Patient Care Team: Virgie Dad, MD as PCP - General (Internal Medicine) Minus Breeding, MD as PCP - Cardiology (Cardiology) Azucena Fallen, MD as Consulting Physician (Obstetrics and Gynecology) Chesley Mires, MD as Consulting Physician (Pulmonary Disease) Mast, Man X, NP as Nurse Practitioner (Internal Medicine) Christin Fudge, MD as Consulting Physician (Surgery)  Extended Emergency Contact Information Primary Emergency Contact: Boxman,Lee Address: [address]          [place], [place] [address] Johnnette Litter of [place] Phone: [phone] Mobile Phone: [phone] Relation: Daughter Secondary Emergency Contact: Ritta Slot [place] of [place] Mobile Phone: [phone] Relation: Daughter  Code Status: Full Code Goals of care:  Advanced Directive information Advanced Directives 01/29/2019  Does Patient Have a Medical Advance Directive? Yes  Type of Paramedic of [place];Living will  Does patient want to make changes to medical advance directive? No - Patient declined  Copy of [place] in Chart? Yes - validated most recent copy scanned in chart (See row information)  Would patient like information on creating a medical advance directive? -  Pre-existing out of facility DNR order (yellow form or pink MOST form) -     Allergies  Allergen Reactions   Azithromycin Itching   Beta Adrenergic Blockers Other (See Comments)    Depression    Ciprofloxacin Other (See Comments)    Edgy and very jumpy     Codeine Nausea And Vomiting   Darvon [Propoxyphene] Nausea And Vomiting   Epinephrine Other (See Comments)    Rapid pulse, sweats   Klonopin [Clonazepam] Other (See Comments)    Makes her feel very suicidal    Oxycodone Other (See Comments)    Patient felt like it  altered her mental status "Crazy" . Hallucinations later as well.   Paxil [Paroxetine Hydrochloride] Other (See Comments)    Severe depression    Prednisone Other (See Comments)    "makes me feel really bad"   Propoxyphene Hcl Nausea And Vomiting   Tape Itching   Torsemide Other (See Comments)    Patient stated that she doesn't recall the reaction   Tramadol Other (See Comments)    Feels "jittery"   Vicodin [Hydrocodone-Acetaminophen] Other (See Comments)    Hallucinations   Meloxicam Diarrhea   Other Rash    Polyester sheets "cause me a rash"   Vancomycin Rash    Localized rash related to infusion rate    Chief Complaint  Patient presents with   Discharge Note    HPI:  85 y.o. female  Seen today for discharge from SNF to her Apartment in [place]  Patient has h/o Hypertension, Chronic LE edema with Diastolic CHF and Lymphedema, Anxiety, B12 def,OSA but does not use CPAP only Oxygen , Diabetes Type 2 Her Acute problems were  Episodes of Confusion Patient episodes of confusion with visual hallucination.  She had a negative CT scan of her head.  And was started on Seroquel.  Since then she had started feeling better.   She was referred to neurology.  Per neurology they think that probably patient has partial complex seizures.  She was started on Lamictal Her Mri showed Parietal Lobe atrophy but no acute Changes EEG negative. She did not want to take Lamictal are any thing else It was discontinued.  Patient says she has not had  any more episodes for last few weeks Diarrhea Patient had work-up by GI.  All her stools were negative.  lactoferrin was positive.  At this time plan is to just try as needed Questran.  Patient feels much better with no more Loose stools Hypothyroidism Her thyroid was increased due to TSH being more than 20  Patient continues to work with therapy.  And now is able to do her transfers.  Is able to walk short distances with no assist.   Mentally also she has improved and her mood has improved.  [place]   Past Medical History:  Diagnosis Date   Altered mental status    ARTHRITIS, KNEES, BILATERAL    Breast CA ([facility]) 2000   s/p R lumpectomy and XRT   CHF (congestive heart failure) ([facility])    DEPRESSION    DJD (degenerative joint disease) of knee    GERD    HYPERLIPIDEMIA    HYPERTENSION    HYPOTHYROIDISM    postsurgical   Hypoxia    MIGRAINE HEADACHE    Morbid obesity (HCC)    OSA (obstructive sleep apnea) 01/17/2011 dx   Oxygen dependent    Urge incontinence    UTI (lower urinary tract infection) 12/2014    Past Surgical History:  Procedure Laterality Date   ABDOMINAL HYSTERECTOMY     ADENOIDECTOMY     BREAST BIOPSY  2000   BREAST LUMPECTOMY  2000   CATARACT EXTRACTION     CHOLECYSTECTOMY  1995   CYSTOSCOPY/URETEROSCOPY/HOLMIUM LASER Right 06/28/2015   Procedure: CYSTOSCOPY RIGHT RETROGRAD RIGHT URETEROSCOPY/HOLMIUM LASER WITH RIGHT STENT PLACEMENT;  Surgeon: Alexis Frock, MD;  Location: [facility];  Service: Urology;  Laterality: Right;   PARATHYROIDECTOMY     2-3 removed   REPLACEMENT TOTAL KNEE BILATERAL  1999, 2005   Right leg femur repaired     THYROIDECTOMY     TONSILLECTOMY     TUBAL LIGATION        reports that she quit smoking about 44 years ago. Her smoking use included cigarettes. She has a 0.50 pack-year smoking history. She has never used smokeless tobacco. She reports that she does not drink alcohol or use drugs. Social History   Socioeconomic History   Marital status: Married    Spouse name: Not on file   Number of children: 5   Years of education: some college   Highest education level: Not on file  Occupational History   Occupation: RETIRED    Employer: RETIRED    Comment: worked on Marketing executive strain: Not hard at all   Food insecurity    Worry: Never true    Inability: Never  true   Transportation needs    Medical: No    Non-medical: No  Tobacco Use   Smoking status: Former Smoker    Packs/day: 0.25    Years: 2.00    Pack years: 0.50    Types: Cigarettes    Quit date: 06/19/1974    Years since quitting: 44.6   Smokeless tobacco: Never Used   Tobacco comment: Quit in late 20's  Substance and Sexual Activity   Alcohol use: No    Alcohol/week: 0.0 standard drinks   Drug use: No   Sexual activity: Never  Lifestyle   Physical activity    Days per week: 0 days    Minutes per session: 0 min   Stress: Only a little  Relationships  Social Herbalist on phone: More than three times a week    Gets together: Once a week    Attends religious service: More than 4 times per year    Active member of club or organization: Yes    Attends meetings of clubs or organizations: 1 to 4 times per year    Relationship status: Married   Intimate partner violence    Fear of current or ex partner: No    Emotionally abused: No    Physically abused: No    Forced sexual activity: No  Other Topics Concern   Not on file  Social History Narrative   Married to husband that has dementia (he lives in the skilled nursing wing) and this is a Runner, broadcasting/film/video. 5 children. 10 grandchildren. 2 greatgrandchildren. All children [place]      Lives at [facility], in independent living.      Retired from multiple different Community education officer, university, Psychologist, educational.       Hobbies: Management consultant, write poetry, craft dolls      Right-handed.      No daily caffeine use.   Functional Status Survey:    Allergies  Allergen Reactions   Azithromycin Itching   Beta Adrenergic Blockers Other (See Comments)    Depression    Ciprofloxacin Other (See Comments)    Edgy and very jumpy     Codeine Nausea And Vomiting   Darvon [Propoxyphene] Nausea And Vomiting   Epinephrine Other (See Comments)    Rapid pulse, sweats   Klonopin [Clonazepam] Other (See  Comments)    Makes her feel very suicidal    Oxycodone Other (See Comments)    Patient felt like it altered her mental status "Crazy" . Hallucinations later as well.   Paxil [Paroxetine Hydrochloride] Other (See Comments)    Severe depression    Prednisone Other (See Comments)    "makes me feel really bad"   Propoxyphene Hcl Nausea And Vomiting   Tape Itching   Torsemide Other (See Comments)    Patient stated that she doesn't recall the reaction   Tramadol Other (See Comments)    Feels "jittery"   Vicodin [Hydrocodone-Acetaminophen] Other (See Comments)    Hallucinations   Meloxicam Diarrhea   Other Rash    Polyester sheets "cause me a rash"   Vancomycin Rash    Localized rash related to infusion rate    Pertinent  Health Maintenance Due  Topic Date Due   FOOT EXAM  09/08/1940   OPHTHALMOLOGY EXAM  09[birth year]08/1940   URINE MICROALBUMIN  09/08/1940   DEXA SCAN  09/07/1997   HEMOGLOBIN A1C  11/19/2018   INFLUENZA VACCINE  01/18/2019   PNA vac Low Risk Adult  Completed    Medications: Outpatient Encounter Medications as of 02/10/2019  Medication Sig   acetaminophen (TYLENOL) 325 MG tablet Take 650 mg by mouth every 8 (eight) hours as needed for mild pain or moderate pain.    aspirin 81 MG tablet Take 81 mg by mouth daily.    calcium citrate-vitamin D (CITRACAL+D) 315-200 MG-UNIT tablet Take 1 tablet by mouth daily. $RemoveBefo'200mg'RZJfNoBbLqv$  tablet daily   cetirizine (ZYRTEC) 10 MG chewable tablet Chew 10 mg by mouth daily as needed for allergies.    cholestyramine (QUESTRAN) 4 GM/DOSE powder Take 2 g by mouth 2 (two) times daily. Give 4 gm in juice or water once daily in the morning. *Give 2 hours apart from other meds.   cyanocobalamin 2000 MCG tablet Take  2,000 mcg by mouth daily.   levothyroxine (SYNTHROID) 175 MCG tablet Take 175 mcg by mouth daily before breakfast.   loperamide (IMODIUM) 2 MG capsule Take 2 mg by mouth daily as needed for diarrhea or loose stools.    Melatonin 5 MG TABS Take 5 mg by mouth at bedtime.   metolazone (ZAROXOLYN) 2.5 MG tablet Take 2.5 mg by mouth daily.   omeprazole (PRILOSEC) 20 MG capsule Take 20 mg by mouth 2 (two) times a day.    ondansetron (ZOFRAN) 4 MG tablet TAKE (1) TABLET BY MOUTH EVERY SIX HOURS AS NEEDED FOR NAUSEA.   OXYGEN Inhale 2 L into the lungs continuous. KEEP O2 SATS >= 90%. Every Shift   polyvinyl alcohol (LIQUIFILM TEARS) 1.4 % ophthalmic solution Place 2 drops into both eyes 4 (four) times daily.   potassium chloride (MICRO-K) 10 MEQ CR capsule Take 40 mEq by mouth daily. Take 4 capsules   QUEtiapine (SEROQUEL) 25 MG tablet Take 12.5 mg by mouth at bedtime.   saccharomyces boulardii (FLORASTOR) 250 MG capsule Take 250 mg by mouth daily.   sertraline (ZOLOFT) 100 MG tablet Take 100 mg by mouth at bedtime.    spironolactone (ALDACTONE) 25 MG tablet Take 25 mg by mouth daily.   [DISCONTINUED] cholestyramine (QUESTRAN) 4 g packet Take 1 packet (4 g total) by mouth 2 (two) times daily. Mix in juice or water once daily in the morning 2 hours away from any other medications   [DISCONTINUED] loperamide (IMODIUM) 2 MG capsule Take 2 mg by mouth daily as needed. Every morning before breakfast    No facility-administered encounter medications on file as of 02/10/2019.     Review of Systems  Vitals:   02/10/19 1112  BP: 130/78  Pulse: 80  Resp: 18  Temp: (!) 97.3 F (36.3 C)  SpO2: 94%  Weight: 273 lb (123.8 kg)  Height: $Remove'5\' 2"'oZnnXnr$  (1.575 m)   Body mass index is 49.93 kg/m. Physical Exam  Labs reviewed: Basic Metabolic Panel: Recent Labs    05/20/18 0000  08/09/18 0533 08/10/18 0535  11/06/18 1318 11/28/18 12/17/18  NA 139   < > 138 139   < > 140 141 140  K 3.6   < > 3.8 4.1   < > 3.9 3.6 4.0  CL 100   < > 99 100  --  100  --   --   CO2 31   < > 30 31  --  32  --   --   GLUCOSE 114*   < > 133* 103*  --  95  --   --   BUN 21   < > 23 10   < > 7* 14 14  CREATININE 0.92*   < > 0.97 0.75    < > 0.99 0.9 0.9  CALCIUM 8.1*   < > 8.1* 7.5*  --  7.9*  --   --   MG 1.6  --   --   --   --   --   --   --    < > = values in this interval not displayed.   Liver Function Tests: Recent Labs    04/23/18 1519 05/20/18 0000 08/10/18 0535 10/30/18 11/06/18 1318  AST $Re'20 16 21 14 'fcE$ 13*  ALT $Re'12 11 11 8 10  'PSY$ ALKPHOS 84  --  79 78 69  BILITOT 0.7 0.5 0.9  --  0.6  PROT 6.6 6.4 5.7*  --  5.7*  ALBUMIN 3.0*  --  2.8*  --  2.7*   No results for input(s): LIPASE, AMYLASE in the last 8760 hours. No results for input(s): AMMONIA in the last 8760 hours. CBC: Recent Labs    07/05/18 1248  08/09/18 0533 08/10/18 0621 10/30/18 11/06/18 1318  WBC 6.3  --  7.3 5.4 7.3 6.6  NEUTROABS 4.7  --  4.6  --   --  4.7  HGB 12.9   < > 12.2 11.5* 12.6 11.7*  HCT 41.0   < > 39.1 38.6 39 37.7  MCV 92.3  --  96.3 98.2  --  92.2  PLT 142*  --  129* 133* 132* 140*   < > = values in this interval not displayed.   Cardiac Enzymes: Recent Labs    08/09/18 1319 08/09/18 1824 11/06/18 1318  TROPONINI <0.03 <0.03 <0.03   BNP: Invalid input(s): POCBNP CBG: Recent Labs    04/25/18 0648 04/25/18 1211 04/25/18 1633  GLUCAP 103* 97 128*    Procedures and Imaging Studies During Stay: Mr Brain Wo Contrast  Result Date: 02/03/2019  [facility] [address], [place], [place] [address] [phone] NEUROIMAGING REPORT STUDY DATE: 02/03/2019 PATIENT NAME: AMARYAH MALLEN DOB: 08/26/1930 MRN: [id number] EXAM: MRI Brain without contrast ORDERING CLINICIAN: Marcial Pacas MD, PhD CLINICAL HISTORY: 85 year old woman with confusion and visual hallucinations COMPARISON FILMS: CT 03/17/2016 TECHNIQUE: MRI of the brain without contrast was obtained utilizing 5 mm axial slices with T1, T2, T2 flair, SWI and diffusion weighted views.  T1 sagittal and T2 coronal views were obtained. CONTRAST: none IMAGING SITE: [facility], [place], [place] FINDINGS: On sagittal images, the spinal  cord is imaged caudally to C3 and is normal in caliber.   The contents of the posterior fossa are of normal size and position.   The pituitary gland and optic chiasm appear normal.   There is cortical atrophy that is most pronounced in the parietal lobes and the posterior frontal lobes.  Significant medial temporal lobe atrophy is not noted.  The ventricles are normal in size for age and without distortion.  There are no abnormal extra-axial collections of fluid.  Mild T2 hyperintensity is noted within the pons.  The cerebellum appears normal.   The deep gray matter appears normal.  In the hemispheres, there are scattered T2/flair hyperintense foci in the subcortical and deep white matter.  None of these appear to be acute.  Diffusion weighted images are normal.  Susceptibility weighted images are normal.  The VIIth/VIIIth nerve complex appears normal.  There have been bilateral lens replacements.  The orbits are otherwise normal.  The mastoid air cells appear normal.  The paranasal sinuses appear normal.  Flow voids are identified within the major intracerebral arteries.     This MRI of the brain without contrast shows the following: 1.   There is moderate atrophy of the parietal lobes and posterior frontal lobes with relative sparing elsewhere.  Isolated parietal atrophy can be seen in some patients with Alzheimer's disease and in posterior cortical atrophy.  2.   Moderate chronic microvascular ischemic change. 3.   There are no acute findings. INTERPRETING PHYSICIAN: Richard A. Felecia Shelling, MD, PhD, FAAN Certified in  Neuroimaging by [employer]    Assessment/Plan:    ? Partial Complex Seizures with some transient confusions Was started on Lamictal by neurology Patient refused Continue on Seroquel Patient's EEG and MRI are Negative for any acute Process She is going to follow-up with neurology  Chronic diarrhea Her worker was negative for GI She was started on Questran Patient is  doing well Hypothyroidism Increased her Synthroid to 175 Will repeat TSH  Depression Stable on Zoloft and Seroquel  Chronic diastolic CHF On metolazone and Aldactone Renal function is stable and her weight is stable  Obesity hypoventilation syndrome (HCC) Does not use CPAP anymore On Chronic Oxygen at night   Gastroesophageal reflux disease without esophagitis On Omeprazole  Diabetes mellitus type 2 in obese (HCC) Not on any Meds Repeat A1C  CKD (chronic kidney disease) stage 3, GFR 30-59 ml/min (HCC) Creat Stable Repeat BMP  Vitamin B12 deficiency Continue Supplement Last Level in 01/20 Was WNL Will Repeat B12level  Will get Labs before discharge CBC,CMP,TSH,B12 and A1C Patient is being discharged with the following home health services:  None  Patient is being discharged with the following durable medical equipment:  None  Patient has been advised to f/u with their PCP in 1-2 weeks to for a transitions of care visit.  Social services at their facility was responsible for arranging this appointment.  Pt was provided with adequate prescriptions of noncontrolled medications to reach the scheduled appointment .  For controlled substances, a limited supply was provided as appropriate for the individual patient.  If the pt normally receives these medications from a pain clinic or has a contract with another physician, these medications should be received from that clinic or physician only).    Future labs/tests needed:   Discharge Time more then 30 min

## 2019-02-11 ENCOUNTER — Other Ambulatory Visit: Payer: Self-pay | Admitting: *Deleted

## 2019-02-11 DIAGNOSIS — D519 Vitamin B12 deficiency anemia, unspecified: Secondary | ICD-10-CM | POA: Diagnosis not present

## 2019-02-11 DIAGNOSIS — E118 Type 2 diabetes mellitus with unspecified complications: Secondary | ICD-10-CM | POA: Diagnosis not present

## 2019-02-11 DIAGNOSIS — I5032 Chronic diastolic (congestive) heart failure: Secondary | ICD-10-CM | POA: Diagnosis not present

## 2019-02-11 DIAGNOSIS — E876 Hypokalemia: Secondary | ICD-10-CM | POA: Diagnosis not present

## 2019-02-11 DIAGNOSIS — N183 Chronic kidney disease, stage 3 (moderate): Secondary | ICD-10-CM | POA: Diagnosis not present

## 2019-02-11 DIAGNOSIS — I10 Essential (primary) hypertension: Secondary | ICD-10-CM | POA: Diagnosis not present

## 2019-02-11 DIAGNOSIS — E039 Hypothyroidism, unspecified: Secondary | ICD-10-CM | POA: Diagnosis not present

## 2019-02-11 MED ORDER — CALCIUM CITRATE-VITAMIN D 315-200 MG-UNIT PO TABS: 1.0000 | ORAL_TABLET | Freq: Every day | ORAL | 1 refills | Status: DC

## 2019-02-11 MED ORDER — POLYVINYL ALCOHOL 1.4 % OP SOLN: 2.0000 [drp] | Freq: Four times a day (QID) | OPHTHALMIC | 2 refills | Status: DC

## 2019-02-11 MED ORDER — ONDANSETRON HCL 4 MG PO TABS: ORAL_TABLET | ORAL | 0 refills | Status: DC

## 2019-02-11 MED ORDER — LEVOTHYROXINE SODIUM 175 MCG PO TABS: 175.0000 ug | ORAL_TABLET | Freq: Every day | ORAL | 2 refills | Status: DC

## 2019-02-11 MED ORDER — CHOLESTYRAMINE 4 GM/DOSE PO POWD: 2.0000 g | Freq: Two times a day (BID) | ORAL | 1 refills | Status: DC

## 2019-02-11 MED ORDER — SPIRONOLACTONE 25 MG PO TABS: 25.0000 mg | ORAL_TABLET | Freq: Every day | ORAL | 1 refills | Status: DC

## 2019-02-11 MED ORDER — CETIRIZINE HCL 10 MG PO CHEW: 10.0000 mg | CHEWABLE_TABLET | Freq: Every day | ORAL | 1 refills | Status: DC | PRN

## 2019-02-11 MED ORDER — OMEPRAZOLE 20 MG PO CPDR: 20.0000 mg | DELAYED_RELEASE_CAPSULE | Freq: Two times a day (BID) | ORAL | 1 refills | Status: DC

## 2019-02-11 MED ORDER — CYANOCOBALAMIN 2000 MCG PO TABS: 2000.0000 ug | ORAL_TABLET | Freq: Every day | ORAL | 1 refills | Status: DC

## 2019-02-11 MED ORDER — MELATONIN 5 MG PO TABS: 5.0000 mg | ORAL_TABLET | Freq: Every day | ORAL | 1 refills | Status: AC

## 2019-02-11 MED ORDER — METOLAZONE 2.5 MG PO TABS: 2.5000 mg | ORAL_TABLET | Freq: Every day | ORAL | 1 refills | Status: DC

## 2019-02-11 MED ORDER — SERTRALINE HCL 100 MG PO TABS: 100.0000 mg | ORAL_TABLET | Freq: Every day | ORAL | 0 refills | Status: DC

## 2019-02-11 MED ORDER — POTASSIUM CHLORIDE ER 10 MEQ PO CPCR: 40.0000 meq | ORAL_CAPSULE | Freq: Every day | ORAL | 1 refills | Status: DC

## 2019-02-11 MED ORDER — QUETIAPINE FUMARATE 25 MG PO TABS: 12.5000 mg | ORAL_TABLET | Freq: Every day | ORAL | 0 refills | Status: DC

## 2019-02-11 MED ORDER — ACETAMINOPHEN 325 MG PO TABS: 650.0000 mg | ORAL_TABLET | Freq: Three times a day (TID) | ORAL | 1 refills | Status: DC | PRN

## 2019-02-11 MED ORDER — LOPERAMIDE HCL 2 MG PO CAPS: 2.0000 mg | ORAL_CAPSULE | Freq: Every day | ORAL | 2 refills | Status: DC | PRN

## 2019-02-11 MED ORDER — SACCHAROMYCES BOULARDII 250 MG PO CAPS: 250.0000 mg | ORAL_CAPSULE | Freq: Every day | ORAL | 1 refills | Status: AC

## 2019-02-11 MED ORDER — ASPIRIN EC 81 MG PO TBEC: 81.0000 mg | DELAYED_RELEASE_TABLET | Freq: Every day | ORAL | 1 refills | Status: DC

## 2019-02-12 DIAGNOSIS — G4730 Sleep apnea, unspecified: Secondary | ICD-10-CM | POA: Diagnosis not present

## 2019-02-12 DIAGNOSIS — G4700 Insomnia, unspecified: Secondary | ICD-10-CM | POA: Diagnosis not present

## 2019-02-12 DIAGNOSIS — E785 Hyperlipidemia, unspecified: Secondary | ICD-10-CM | POA: Diagnosis not present

## 2019-02-12 DIAGNOSIS — E038 Other specified hypothyroidism: Secondary | ICD-10-CM | POA: Diagnosis not present

## 2019-02-12 DIAGNOSIS — A4189 Other specified sepsis: Secondary | ICD-10-CM | POA: Diagnosis not present

## 2019-02-12 DIAGNOSIS — E039 Hypothyroidism, unspecified: Secondary | ICD-10-CM | POA: Diagnosis not present

## 2019-02-12 DIAGNOSIS — I10 Essential (primary) hypertension: Secondary | ICD-10-CM | POA: Diagnosis not present

## 2019-02-12 DIAGNOSIS — Z7389 Other problems related to life management difficulty: Secondary | ICD-10-CM | POA: Diagnosis not present

## 2019-02-12 DIAGNOSIS — R601 Generalized edema: Secondary | ICD-10-CM | POA: Diagnosis not present

## 2019-02-12 DIAGNOSIS — R41841 Cognitive communication deficit: Secondary | ICD-10-CM | POA: Diagnosis not present

## 2019-02-12 DIAGNOSIS — M79651 Pain in right thigh: Secondary | ICD-10-CM | POA: Diagnosis not present

## 2019-02-12 DIAGNOSIS — M5030 Other cervical disc degeneration, unspecified cervical region: Secondary | ICD-10-CM | POA: Diagnosis not present

## 2019-02-12 DIAGNOSIS — F3289 Other specified depressive episodes: Secondary | ICD-10-CM | POA: Diagnosis not present

## 2019-02-12 DIAGNOSIS — R2681 Unsteadiness on feet: Secondary | ICD-10-CM | POA: Diagnosis not present

## 2019-02-12 DIAGNOSIS — M129 Arthropathy, unspecified: Secondary | ICD-10-CM | POA: Diagnosis not present

## 2019-02-12 DIAGNOSIS — M75110 Incomplete rotator cuff tear or rupture of unspecified shoulder, not specified as traumatic: Secondary | ICD-10-CM | POA: Diagnosis not present

## 2019-02-12 DIAGNOSIS — I890 Lymphedema, not elsewhere classified: Secondary | ICD-10-CM | POA: Diagnosis not present

## 2019-02-12 DIAGNOSIS — Z9089 Acquired absence of other organs: Secondary | ICD-10-CM | POA: Diagnosis not present

## 2019-02-12 DIAGNOSIS — M25559 Pain in unspecified hip: Secondary | ICD-10-CM | POA: Diagnosis not present

## 2019-02-12 DIAGNOSIS — F329 Major depressive disorder, single episode, unspecified: Secondary | ICD-10-CM | POA: Diagnosis not present

## 2019-02-12 DIAGNOSIS — E662 Morbid (severe) obesity with alveolar hypoventilation: Secondary | ICD-10-CM | POA: Diagnosis not present

## 2019-02-12 DIAGNOSIS — K219 Gastro-esophageal reflux disease without esophagitis: Secondary | ICD-10-CM | POA: Diagnosis not present

## 2019-02-12 DIAGNOSIS — C50911 Malignant neoplasm of unspecified site of right female breast: Secondary | ICD-10-CM | POA: Diagnosis not present

## 2019-02-13 DIAGNOSIS — R601 Generalized edema: Secondary | ICD-10-CM | POA: Diagnosis not present

## 2019-02-13 DIAGNOSIS — Z7389 Other problems related to life management difficulty: Secondary | ICD-10-CM | POA: Diagnosis not present

## 2019-02-13 DIAGNOSIS — A4189 Other specified sepsis: Secondary | ICD-10-CM | POA: Diagnosis not present

## 2019-02-13 DIAGNOSIS — M79651 Pain in right thigh: Secondary | ICD-10-CM | POA: Diagnosis not present

## 2019-02-13 DIAGNOSIS — R2681 Unsteadiness on feet: Secondary | ICD-10-CM | POA: Diagnosis not present

## 2019-02-13 DIAGNOSIS — R41841 Cognitive communication deficit: Secondary | ICD-10-CM | POA: Diagnosis not present

## 2019-02-15 ENCOUNTER — Other Ambulatory Visit: Payer: Medicare Other

## 2019-02-17 ENCOUNTER — Other Ambulatory Visit: Payer: Self-pay | Admitting: Nurse Practitioner

## 2019-02-17 DIAGNOSIS — E039 Hypothyroidism, unspecified: Secondary | ICD-10-CM

## 2019-02-17 DIAGNOSIS — R41841 Cognitive communication deficit: Secondary | ICD-10-CM | POA: Diagnosis not present

## 2019-02-17 DIAGNOSIS — Z7389 Other problems related to life management difficulty: Secondary | ICD-10-CM | POA: Diagnosis not present

## 2019-02-17 DIAGNOSIS — R601 Generalized edema: Secondary | ICD-10-CM | POA: Diagnosis not present

## 2019-02-17 DIAGNOSIS — M79651 Pain in right thigh: Secondary | ICD-10-CM | POA: Diagnosis not present

## 2019-02-17 DIAGNOSIS — A4189 Other specified sepsis: Secondary | ICD-10-CM | POA: Diagnosis not present

## 2019-02-17 DIAGNOSIS — R2681 Unsteadiness on feet: Secondary | ICD-10-CM | POA: Diagnosis not present

## 2019-02-18 DIAGNOSIS — E662 Morbid (severe) obesity with alveolar hypoventilation: Secondary | ICD-10-CM | POA: Diagnosis not present

## 2019-02-18 DIAGNOSIS — E038 Other specified hypothyroidism: Secondary | ICD-10-CM | POA: Diagnosis not present

## 2019-02-18 DIAGNOSIS — M6281 Muscle weakness (generalized): Secondary | ICD-10-CM | POA: Diagnosis not present

## 2019-02-18 DIAGNOSIS — E039 Hypothyroidism, unspecified: Secondary | ICD-10-CM | POA: Diagnosis not present

## 2019-02-18 DIAGNOSIS — I10 Essential (primary) hypertension: Secondary | ICD-10-CM | POA: Diagnosis not present

## 2019-02-18 DIAGNOSIS — K219 Gastro-esophageal reflux disease without esophagitis: Secondary | ICD-10-CM | POA: Diagnosis not present

## 2019-02-18 DIAGNOSIS — M75110 Incomplete rotator cuff tear or rupture of unspecified shoulder, not specified as traumatic: Secondary | ICD-10-CM | POA: Diagnosis not present

## 2019-02-18 DIAGNOSIS — Z9089 Acquired absence of other organs: Secondary | ICD-10-CM | POA: Diagnosis not present

## 2019-02-18 DIAGNOSIS — C50911 Malignant neoplasm of unspecified site of right female breast: Secondary | ICD-10-CM | POA: Diagnosis not present

## 2019-02-18 DIAGNOSIS — F329 Major depressive disorder, single episode, unspecified: Secondary | ICD-10-CM | POA: Diagnosis not present

## 2019-02-18 DIAGNOSIS — G4730 Sleep apnea, unspecified: Secondary | ICD-10-CM | POA: Diagnosis not present

## 2019-02-18 DIAGNOSIS — R2681 Unsteadiness on feet: Secondary | ICD-10-CM | POA: Diagnosis not present

## 2019-02-18 DIAGNOSIS — M25559 Pain in unspecified hip: Secondary | ICD-10-CM | POA: Diagnosis not present

## 2019-02-18 DIAGNOSIS — G4700 Insomnia, unspecified: Secondary | ICD-10-CM | POA: Diagnosis not present

## 2019-02-18 DIAGNOSIS — R601 Generalized edema: Secondary | ICD-10-CM | POA: Diagnosis not present

## 2019-02-18 DIAGNOSIS — M129 Arthropathy, unspecified: Secondary | ICD-10-CM | POA: Diagnosis not present

## 2019-02-18 DIAGNOSIS — E785 Hyperlipidemia, unspecified: Secondary | ICD-10-CM | POA: Diagnosis not present

## 2019-02-18 DIAGNOSIS — M79651 Pain in right thigh: Secondary | ICD-10-CM | POA: Diagnosis not present

## 2019-02-18 DIAGNOSIS — R41841 Cognitive communication deficit: Secondary | ICD-10-CM | POA: Diagnosis not present

## 2019-02-18 DIAGNOSIS — F3289 Other specified depressive episodes: Secondary | ICD-10-CM | POA: Diagnosis not present

## 2019-02-18 DIAGNOSIS — M5030 Other cervical disc degeneration, unspecified cervical region: Secondary | ICD-10-CM | POA: Diagnosis not present

## 2019-02-18 DIAGNOSIS — A4189 Other specified sepsis: Secondary | ICD-10-CM | POA: Diagnosis not present

## 2019-02-18 DIAGNOSIS — I890 Lymphedema, not elsewhere classified: Secondary | ICD-10-CM | POA: Diagnosis not present

## 2019-02-18 DIAGNOSIS — Z7389 Other problems related to life management difficulty: Secondary | ICD-10-CM | POA: Diagnosis not present

## 2019-02-20 DIAGNOSIS — E039 Hypothyroidism, unspecified: Secondary | ICD-10-CM | POA: Diagnosis not present

## 2019-02-25 DIAGNOSIS — L570 Actinic keratosis: Secondary | ICD-10-CM | POA: Diagnosis not present

## 2019-02-25 DIAGNOSIS — L814 Other melanin hyperpigmentation: Secondary | ICD-10-CM | POA: Diagnosis not present

## 2019-02-25 DIAGNOSIS — Z85828 Personal history of other malignant neoplasm of skin: Secondary | ICD-10-CM | POA: Diagnosis not present

## 2019-02-25 DIAGNOSIS — L304 Erythema intertrigo: Secondary | ICD-10-CM | POA: Diagnosis not present

## 2019-02-25 DIAGNOSIS — D2221 Melanocytic nevi of right ear and external auricular canal: Secondary | ICD-10-CM | POA: Diagnosis not present

## 2019-02-26 DIAGNOSIS — Z7389 Other problems related to life management difficulty: Secondary | ICD-10-CM | POA: Diagnosis not present

## 2019-02-26 DIAGNOSIS — R41841 Cognitive communication deficit: Secondary | ICD-10-CM | POA: Diagnosis not present

## 2019-02-26 DIAGNOSIS — R2681 Unsteadiness on feet: Secondary | ICD-10-CM | POA: Diagnosis not present

## 2019-02-26 DIAGNOSIS — M6281 Muscle weakness (generalized): Secondary | ICD-10-CM | POA: Diagnosis not present

## 2019-02-26 DIAGNOSIS — R601 Generalized edema: Secondary | ICD-10-CM | POA: Diagnosis not present

## 2019-02-26 DIAGNOSIS — M79651 Pain in right thigh: Secondary | ICD-10-CM | POA: Diagnosis not present

## 2019-02-27 DIAGNOSIS — Z7389 Other problems related to life management difficulty: Secondary | ICD-10-CM | POA: Diagnosis not present

## 2019-02-27 DIAGNOSIS — M79651 Pain in right thigh: Secondary | ICD-10-CM | POA: Diagnosis not present

## 2019-02-27 DIAGNOSIS — R601 Generalized edema: Secondary | ICD-10-CM | POA: Diagnosis not present

## 2019-02-27 DIAGNOSIS — M6281 Muscle weakness (generalized): Secondary | ICD-10-CM | POA: Diagnosis not present

## 2019-02-27 DIAGNOSIS — R2681 Unsteadiness on feet: Secondary | ICD-10-CM | POA: Diagnosis not present

## 2019-02-27 DIAGNOSIS — R41841 Cognitive communication deficit: Secondary | ICD-10-CM | POA: Diagnosis not present

## 2019-03-03 ENCOUNTER — Other Ambulatory Visit: Payer: Self-pay | Admitting: Internal Medicine

## 2019-03-03 NOTE — Telephone Encounter (Signed)
High risk or very high risk warning populated when attempting to refill medication. RX request sent to PCP for review and approval if warranted.   

## 2019-03-10 ENCOUNTER — Telehealth: Payer: Self-pay | Admitting: Neurology

## 2019-03-10 NOTE — Telephone Encounter (Signed)
Pt called stating that her ride fell through and she is not able to make it to her appt tomorrow. Pt would like to know if provider would to a televisit. For the appt. Please advise.

## 2019-03-10 NOTE — Telephone Encounter (Signed)
She is active on mychart.  I have called the patient back and converted her follow up to a virtual visit.

## 2019-03-11 ENCOUNTER — Encounter: Payer: Self-pay | Admitting: Neurology

## 2019-03-11 ENCOUNTER — Telehealth (INDEPENDENT_AMBULATORY_CARE_PROVIDER_SITE_OTHER): Payer: Medicare Other | Admitting: Neurology

## 2019-03-11 DIAGNOSIS — R410 Disorientation, unspecified: Secondary | ICD-10-CM

## 2019-03-11 MED ORDER — MEMANTINE HCL 10 MG PO TABS: 10.0000 mg | ORAL_TABLET | Freq: Two times a day (BID) | ORAL | 11 refills | Status: DC

## 2019-03-11 NOTE — Progress Notes (Signed)
PATIENT: Tamara Morales DOB: [birth year]  No chief complaint on file.    HISTORICAL  Tamara Morales is a 85 year old female, seen in request by her primary care physician Dr. Lyndel Safe, [name] for evaluation of visual hallucination, confusion, she is accompanied by her daughter [name] at today's visit on December 31, 2018.  I have reviewed and summarized the referring note from the referring physician.  She has past medical history of obesity, bilateral lower extremity lymphedema, congestive heart failure, hypertension, hypothyroidism, vitamin B12 deficiency, but denies significant history of memory loss, her mother suffered memory in her 72s.  She has baseline gait abnormality, using motorized chair, able to transfer with assistance.  [facility] at [facility], but she decided to stop some of her medications in early March 2020, she was noted to have mild confusion, she was moved to assisted living, and on Nov 14 2018, she fell landed face down, began to have headaches, neck pain, personally reviewed CT head without contrast, chronic generalized atrophy, ischemic changes, no acute abnormality.  CT of cervical spine showed multilevel degenerative changes without acute abnormality.  Laboratory evaluations showed elevated ESR 79, C-reactive protein 22, normal IgA, BMP, negative troponin BNP was elevated 190 TSH was elevated 20, CMP showed creatinine of 0.9, CBC showed hemoglobin of 11.7,  Ever since that fall, she began to have visual hallucinations, she has vivid hallucinations that her deceased husband is with her at [facility], while she was sitting at her nursing home room, was quite upset when he left her, she had visual hallucinations spells almost on a daily basis, daughter also reported that the spells are usually short lasting for few minutes, she was noted to have mumbled speech, very confused afterwards, sometimes dose of into sleep, there was no clinical seizure  activity noted neck.  Her mother suffered mild memory loss in her 35s.  Virtual Visit via video   I connected with [name] on 03/11/19 at  by video and verified that I am speaking with the correct person using two identifiers.   I discussed the limitations, risks, security and privacy concerns of performing an evaluation and management service by video and the availability of in person appointments. I also discussed with the patient that there may be a patient responsible charge related to this service. The patient expressed understanding and agreed to proceed.  HISTORICAL  She is now back to independent living, was able to work with help desk connected through my chart for virtual visit today, she reported improvement in her symptoms no longer having visual hallucinations, is active in her facility was  member of different communities, she is involved in physical therapy  She has significant gait abnormality, use walkers, she did well on today's Mini-Mental Status Examination  I personally reviewed MRI of the brain August 2020 moderate atrophy, supratentorium small vessel disease, no acute abnormality.   REVIEW OF SYSTEMS: Full 14 system review of systems performed and notable only for as above All other review of systems were negative.  ALLERGIES: Allergies  Allergen Reactions  . Azithromycin Itching  . Beta Adrenergic Blockers Other (See Comments)    Depression   . Ciprofloxacin Other (See Comments)    Edgy and very jumpy    . Codeine Nausea And Vomiting  . Darvon [Propoxyphene] Nausea And Vomiting  . Epinephrine Other (See Comments)    Rapid pulse, sweats  . Klonopin [Clonazepam] Other (See Comments)    Makes her feel very  suicidal   . Oxycodone Other (See Comments)    Patient felt like it altered her mental status "Crazy" . Hallucinations later as well.  [name] Paxil [Paroxetine Hydrochloride] Other (See Comments)    Severe depression   . Prednisone Other (See  Comments)    "makes me feel really bad"  . Propoxyphene Hcl Nausea And Vomiting  . Tape Itching  . Torsemide Other (See Comments)    Patient stated that she doesn't recall the reaction  . Tramadol Other (See Comments)    Feels "jittery"  . Vicodin [Hydrocodone-Acetaminophen] Other (See Comments)    Hallucinations  . Meloxicam Diarrhea  . Other Rash    Polyester sheets "cause me a rash"  . Vancomycin Rash    Localized rash related to infusion rate    HOME MEDICATIONS: Current Outpatient Medications  Medication Sig Dispense Refill  . acetaminophen (TYLENOL) 325 MG tablet Take 2 tablets (650 mg total) by mouth every 8 (eight) hours as needed for mild pain or moderate pain. 100 tablet 1  . aspirin 81 MG tablet Take 81 mg by mouth daily.     [name] aspirin EC 81 MG tablet Take 1 tablet (81 mg total) by mouth daily. 30 tablet 1  . calcium citrate-vitamin D (CITRACAL+D) 315-200 MG-UNIT tablet Take 1 tablet by mouth daily. 239m tablet daily 30 tablet 1  . cetirizine (ZYRTEC) 10 MG chewable tablet Chew 1 tablet (10 mg total) by mouth daily as needed for allergies. 30 tablet 1  . cholestyramine (QUESTRAN) 4 GM/DOSE powder Take 0.5 packets (2 g total) by mouth 2 (two) times daily. Give 4 gm in juice or water once daily in the morning. *Give 2 hours apart from other meds. 378 g 1  . cyanocobalamin 2000 MCG tablet Take 1 tablet (2,000 mcg total) by mouth daily. 30 tablet 1  . levothyroxine (SYNTHROID) 175 MCG tablet Take 1 tablet (175 mcg total) by mouth daily before breakfast. 30 tablet 2  . loperamide (IMODIUM) 2 MG capsule Take 1 capsule (2 mg total) by mouth daily as needed for diarrhea or loose stools. 30 capsule 2  . Melatonin 5 MG TABS Take 1 tablet (5 mg total) by mouth at bedtime. 30 tablet 1  . memantine (NAMENDA) 10 MG tablet Take 1 tablet (10 mg total) by mouth 2 (two) times daily. 60 tablet 11  . metolazone (ZAROXOLYN) 2.5 MG tablet Take 1 tablet (2.5 mg total) by mouth daily. 30 tablet 1   . omeprazole (PRILOSEC) 20 MG capsule Take 1 capsule (20 mg total) by mouth 2 (two) times daily. 60 capsule 1  . ondansetron (ZOFRAN) 4 MG tablet TAKE (1) TABLET BY MOUTH EVERY SIX HOURS AS NEEDED FOR NAUSEA. 12 tablet 0  . OXYGEN Inhale 2 L into the lungs continuous. KEEP O2 SATS >= 90%. Every Shift    . polyvinyl alcohol (LIQUIFILM TEARS) 1.4 % ophthalmic solution Place 2 drops into both eyes 4 (four) times daily. 15 mL 2  . potassium chloride (MICRO-K) 10 MEQ CR capsule Take 4 capsules (40 mEq total) by mouth daily. Take 4 capsules 30 capsule 1  . QUEtiapine (SEROQUEL) 25 MG tablet Take 0.5 tablets (12.5 mg total) by mouth at bedtime. 30 tablet 0  . saccharomyces boulardii (FLORASTOR) 250 MG capsule Take 1 capsule (250 mg total) by mouth daily. 30 capsule 1  . sertraline (ZOLOFT) 100 MG tablet TAKE ONE TABLET AT BEDTIME. 30 tablet 5  . spironolactone (ALDACTONE) 25 MG tablet Take 1 tablet (25 mg  total) by mouth daily. 30 tablet 1   No current facility-administered medications for this visit.     PAST MEDICAL HISTORY: Past Medical History:  Diagnosis Date  . Altered mental status   . ARTHRITIS, KNEES, BILATERAL   . Breast CA ([place]) 2000   s/p R lumpectomy and XRT  . CHF (congestive heart failure) ([place])   . DEPRESSION   . DJD (degenerative joint disease) of knee   . GERD   . HYPERLIPIDEMIA   . HYPERTENSION   . HYPOTHYROIDISM    postsurgical  . Hypoxia   . MIGRAINE HEADACHE   . Morbid obesity ([place])   . OSA (obstructive sleep apnea) 01/17/2011 dx  . Oxygen dependent   . Urge incontinence   . UTI (lower urinary tract infection) 12/2014    PAST SURGICAL HISTORY: Past Surgical History:  Procedure Laterality Date  . ABDOMINAL HYSTERECTOMY    . ADENOIDECTOMY    . BREAST BIOPSY  2000  . BREAST LUMPECTOMY  2000  . CATARACT EXTRACTION    . CHOLECYSTECTOMY  1995  . CYSTOSCOPY/URETEROSCOPY/HOLMIUM LASER Right 06/28/2015   Procedure: CYSTOSCOPY RIGHT RETROGRAD RIGHT  URETEROSCOPY/HOLMIUM LASER WITH RIGHT STENT PLACEMENT;  Surgeon: Alexis Frock, MD;  Location: [facility];  Service: Urology;  Laterality: Right;  . PARATHYROIDECTOMY     2-3 removed  . REPLACEMENT TOTAL KNEE BILATERAL  1999, 2005  . Right leg femur repaired    . THYROIDECTOMY    . TONSILLECTOMY    . TUBAL LIGATION      FAMILY HISTORY: Family History  Problem Relation Age of Onset  . Heart disease Mother   . Heart disease Father   . Emphysema Sister   . Coronary artery disease Neg Hx     SOCIAL HISTORY:   Social History   Socioeconomic History  . Marital status: Married    Spouse name: Not on file  . Number of children: 5  . Years of education: some college  . Highest education level: Not on file  Occupational History  . Occupation: RETIRED    Employer: RETIRED    Comment: worked on Hotel manager  . Financial resource strain: Not hard at all  . Food insecurity    Worry: Never true    Inability: Never true  . Transportation needs    Medical: No    Non-medical: No  Tobacco Use  . Smoking status: Former Smoker    Packs/day: 0.25    Years: 2.00    Pack years: 0.50    Types: Cigarettes    Quit date: 06/19/1974    Years since quitting: 44.7  . Smokeless tobacco: Never Used  . Tobacco comment: Quit in late 20's  Substance and Sexual Activity  . Alcohol use: No    Alcohol/week: 0.0 standard drinks  . Drug use: No  . Sexual activity: Never  Lifestyle  . Physical activity    Days per week: 0 days    Minutes per session: 0 min  . Stress: Only a little  Relationships  . Social connections    Talks on phone: More than three times a week    Gets together: Once a week    Attends religious service: More than 4 times per year    Active member of club or organization: Yes    Attends meetings of clubs or organizations: 1 to 4 times per year    Relationship status: Married  . Intimate partner violence    Fear of current or ex partner:  No    Emotionally abused: No     Physically abused: No    Forced sexual activity: No  Other Topics Concern  . Not on file  Social History Narrative   Married to husband that has dementia (he lives in the skilled nursing wing) and this is a big stressor. 5 children. 10 grandchildren. 2 greatgrandchildren. All children [place]      Lives at [facility], in independent living.      Retired from multiple different Community education officer, university, Psychologist, educational.       Hobbies: Management consultant, write poetry, craft dolls      Right-handed.      No daily caffeine use.    Marcial Pacas, M.D. Ph.D.  [facility] [address] [place], [place] [address] Ph: [phone] Fax: [phone]  CC: Referring Provider  She does has mild memory loss, she was housewife, 5 children, her daughter [name] is her POA, she has   Labs, CRP, ESR, she denies headache, denies hallucination.  SHe has committee in her area, exercise,    REVIEW OF SYSTEMS: Full 14 system review of systems performed and notable only for as above All other review of systems were negative.  ALLERGIES: Allergies  Allergen Reactions  . Azithromycin Itching  . Beta Adrenergic Blockers Other (See Comments)    Depression   . Ciprofloxacin Other (See Comments)    Edgy and very jumpy    . Codeine Nausea And Vomiting  . Darvon [Propoxyphene] Nausea And Vomiting  . Epinephrine Other (See Comments)    Rapid pulse, sweats  . Klonopin [Clonazepam] Other (See Comments)    Makes her feel very suicidal   . Oxycodone Other (See Comments)    Patient felt like it altered her mental status "Crazy" . Hallucinations later as well.  [name] Paxil [Paroxetine Hydrochloride] Other (See Comments)    Severe depression   . Prednisone Other (See Comments)    "makes me feel really bad"  . Propoxyphene Hcl Nausea And Vomiting  . Tape Itching  . Torsemide Other (See Comments)    Patient stated that she doesn't recall the reaction  . Tramadol Other (See  Comments)    Feels "jittery"  . Vicodin [Hydrocodone-Acetaminophen] Other (See Comments)    Hallucinations  . Meloxicam Diarrhea  . Other Rash    Polyester sheets "cause me a rash"  . Vancomycin Rash    Localized rash related to infusion rate    HOME MEDICATIONS: Current Outpatient Medications  Medication Sig Dispense Refill  . acetaminophen (TYLENOL) 325 MG tablet Take 2 tablets (650 mg total) by mouth every 8 (eight) hours as needed for mild pain or moderate pain. 100 tablet 1  . aspirin 81 MG tablet Take 81 mg by mouth daily.     [name] aspirin EC 81 MG tablet Take 1 tablet (81 mg total) by mouth daily. 30 tablet 1  . calcium citrate-vitamin D (CITRACAL+D) 315-200 MG-UNIT tablet Take 1 tablet by mouth daily. 263m tablet daily 30 tablet 1  . cetirizine (ZYRTEC) 10 MG chewable tablet Chew 1 tablet (10 mg total) by mouth daily as needed for allergies. 30 tablet 1  . cholestyramine (QUESTRAN) 4 GM/DOSE powder Take 0.5 packets (2 g total) by mouth 2 (two) times daily. Give 4 gm in juice or water once daily in the morning. *Give 2 hours apart from other meds. 378 g 1  . cyanocobalamin 2000 MCG tablet Take 1 tablet (2,000 mcg total) by mouth daily. 3[place]  tablet 1  . levothyroxine (SYNTHROID) 175 MCG tablet Take 1 tablet (175 mcg total) by mouth daily before breakfast. 30 tablet 2  . loperamide (IMODIUM) 2 MG capsule Take 1 capsule (2 mg total) by mouth daily as needed for diarrhea or loose stools. 30 capsule 2  . Melatonin 5 MG TABS Take 1 tablet (5 mg total) by mouth at bedtime. 30 tablet 1  . metolazone (ZAROXOLYN) 2.5 MG tablet Take 1 tablet (2.5 mg total) by mouth daily. 30 tablet 1  . omeprazole (PRILOSEC) 20 MG capsule Take 1 capsule (20 mg total) by mouth 2 (two) times daily. 60 capsule 1  . ondansetron (ZOFRAN) 4 MG tablet TAKE (1) TABLET BY MOUTH EVERY SIX HOURS AS NEEDED FOR NAUSEA. 12 tablet 0  . OXYGEN Inhale 2 L into the lungs continuous. KEEP O2 SATS >= 90%. Every Shift    .  polyvinyl alcohol (LIQUIFILM TEARS) 1.4 % ophthalmic solution Place 2 drops into both eyes 4 (four) times daily. 15 mL 2  . potassium chloride (MICRO-K) 10 MEQ CR capsule Take 4 capsules (40 mEq total) by mouth daily. Take 4 capsules 30 capsule 1  . QUEtiapine (SEROQUEL) 25 MG tablet Take 0.5 tablets (12.5 mg total) by mouth at bedtime. 30 tablet 0  . saccharomyces boulardii (FLORASTOR) 250 MG capsule Take 1 capsule (250 mg total) by mouth daily. 30 capsule 1  . sertraline (ZOLOFT) 100 MG tablet TAKE ONE TABLET AT BEDTIME. 30 tablet 5  . spironolactone (ALDACTONE) 25 MG tablet Take 1 tablet (25 mg total) by mouth daily. 30 tablet 1   No current facility-administered medications for this visit.     PAST MEDICAL HISTORY: Past Medical History:  Diagnosis Date  . Altered mental status   . ARTHRITIS, KNEES, BILATERAL   . Breast CA ([place]) 2000   s/p R lumpectomy and XRT  . CHF (congestive heart failure) ([place])   . DEPRESSION   . DJD (degenerative joint disease) of knee   . GERD   . HYPERLIPIDEMIA   . HYPERTENSION   . HYPOTHYROIDISM    postsurgical  . Hypoxia   . MIGRAINE HEADACHE   . Morbid obesity ([place])   . OSA (obstructive sleep apnea) 01/17/2011 dx  . Oxygen dependent   . Urge incontinence   . UTI (lower urinary tract infection) 12/2014    PAST SURGICAL HISTORY: Past Surgical History:  Procedure Laterality Date  . ABDOMINAL HYSTERECTOMY    . ADENOIDECTOMY    . BREAST BIOPSY  2000  . BREAST LUMPECTOMY  2000  . CATARACT EXTRACTION    . CHOLECYSTECTOMY  1995  . CYSTOSCOPY/URETEROSCOPY/HOLMIUM LASER Right 06/28/2015   Procedure: CYSTOSCOPY RIGHT RETROGRAD RIGHT URETEROSCOPY/HOLMIUM LASER WITH RIGHT STENT PLACEMENT;  Surgeon: Alexis Frock, MD;  Location: [facility];  Service: Urology;  Laterality: Right;  . PARATHYROIDECTOMY     2-3 removed  . REPLACEMENT TOTAL KNEE BILATERAL  1999, 2005  . Right leg femur repaired    . THYROIDECTOMY    . TONSILLECTOMY    . TUBAL LIGATION       FAMILY HISTORY: Family History  Problem Relation Age of Onset  . Heart disease Mother   . Heart disease Father   . Emphysema Sister   . Coronary artery disease Neg Hx     SOCIAL HISTORY: Social History   Socioeconomic History  . Marital status: Married    Spouse name: Not on file  . Number of children: 5  . Years of education: some college  .  Highest education level: Not on file  Occupational History  . Occupation: RETIRED    Employer: RETIRED    Comment: worked on Hotel manager  . Financial resource strain: Not hard at all  . Food insecurity    Worry: Never true    Inability: Never true  . Transportation needs    Medical: No    Non-medical: No  Tobacco Use  . Smoking status: Former Smoker    Packs/day: 0.25    Years: 2.00    Pack years: 0.50    Types: Cigarettes    Quit date: 06/19/1974    Years since quitting: 44.7  . Smokeless tobacco: Never Used  . Tobacco comment: Quit in late 20's  Substance and Sexual Activity  . Alcohol use: No    Alcohol/week: 0.0 standard drinks  . Drug use: No  . Sexual activity: Never  Lifestyle  . Physical activity    Days per week: 0 days    Minutes per session: 0 min  . Stress: Only a little  Relationships  . Social connections    Talks on phone: More than three times a week    Gets together: Once a week    Attends religious service: More than 4 times per year    Active member of club or organization: Yes    Attends meetings of clubs or organizations: 1 to 4 times per year    Relationship status: Married  . Intimate partner violence    Fear of current or ex partner: No    Emotionally abused: No    Physically abused: No    Forced sexual activity: No  Other Topics Concern  . Not on file  Social History Narrative   Married to husband that has dementia (he lives in the skilled nursing wing) and this is a big stressor. 5 children. 10 grandchildren. 2 greatgrandchildren. All children [place]      Lives at [facility], in  independent living.      Retired from multiple different Community education officer, university, Psychologist, educational.       Hobbies: Management consultant, write poetry, craft dolls      Right-handed.      No daily caffeine use.     PHYSICAL EXAM   There were no vitals filed for this visit.  Not recorded      There is no height or weight on file to calculate BMI.  PHYSICAL EXAMNIATION:  Gen: NAD, conversant, well nourised, obese, well groomed                     Cardiovascular: Regular rate rhythm, no peripheral edema, warm, nontender. Eyes: Conjunctivae clear without exudates or hemorrhage Neck: Supple, no carotid bruits. Pulmonary: Clear to auscultation bilaterally   NEUROLOGICAL EXAM:    MMSE - Mini Mental State Exam 03/11/2019 12/31/2018 03/27/2017  Not completed: - - -  Orientation to time _0 Orientation to Place _1 Registration _2 Attention/ Calculation _3 Recall _4 Language- name 2 objects _5 Language- repeat _6 Language- follow 3 step command _7 Language- read & follow direction _8 Write a sentence _9 Copy design _10 Total score _11 CRANIAL NERVES: CN II: Visual fields are full to confrontation.  Pupils are round equal and briskly reactive to light. CN III,  IV, VI: extraocular movement are normal. No ptosis. CN V: Facial sensation is intact to pinprick in all 3 divisions bilaterally. Corneal responses are intact.  CN VII: Face is symmetric with normal eye closure and smile. CN VIII: Hearing is normal to casual conversation CN IX, X: Palate elevates symmetrically. Phonation is normal. CN XI: Head turning and shoulder shrug are intact CN XII: Tongue is midline with normal movements and no atrophy.  MOTOR: Moving bilateral upper and lower extremity without difficulty, there is no resting tremor, no significant rigidity or bradykinesia noticed.  REFLEXES: Not performed SENSORY: Not performed COORDINATION:  There is no  dysmetria noted on them and trunk  GAIT/STANCE: She was able to get up by pushing on her electronic wheelchair, cautious, mildly unsteady   DIAGNOSTIC DATA (LABS, IMAGING, TESTING) - I reviewed patient records, labs, notes, testing and imaging myself where available.   ASSESSMENT AND PLAN  KAYDI KLEY is a 85 y.o. female   Recurrent episodes of confusion or visual hallucination following her fall on Nov 14, 2018   MRI of the brain showed moderate generalized atrophy, supratentorium small vessel disease.  EEG was normal in July 2020.  Above spells likely indicating exacerbation of her underlying central nervous system degenerative disorder,  Add on Namenda 10 mg twice a day   Return to clinic in 6 months with nurse practitioner Thea Alken, M.D. Ph.D.  [facility] [address], [place], [place] [address] Ph: [phone] Fax: [phone]  CC: Virgie Dad, MD

## 2019-04-01 DIAGNOSIS — L853 Xerosis cutis: Secondary | ICD-10-CM | POA: Diagnosis not present

## 2019-04-01 DIAGNOSIS — Z85828 Personal history of other malignant neoplasm of skin: Secondary | ICD-10-CM | POA: Diagnosis not present

## 2019-04-01 DIAGNOSIS — D1801 Hemangioma of skin and subcutaneous tissue: Secondary | ICD-10-CM | POA: Diagnosis not present

## 2019-04-01 DIAGNOSIS — L814 Other melanin hyperpigmentation: Secondary | ICD-10-CM | POA: Diagnosis not present

## 2019-04-01 DIAGNOSIS — L821 Other seborrheic keratosis: Secondary | ICD-10-CM | POA: Diagnosis not present

## 2019-04-01 DIAGNOSIS — L304 Erythema intertrigo: Secondary | ICD-10-CM | POA: Diagnosis not present

## 2019-04-01 DIAGNOSIS — L570 Actinic keratosis: Secondary | ICD-10-CM | POA: Diagnosis not present

## 2019-04-01 DIAGNOSIS — D2221 Melanocytic nevi of right ear and external auricular canal: Secondary | ICD-10-CM | POA: Diagnosis not present

## 2019-04-02 ENCOUNTER — Other Ambulatory Visit: Payer: Self-pay | Admitting: Internal Medicine

## 2019-04-07 ENCOUNTER — Telehealth: Payer: Self-pay | Admitting: *Deleted

## 2019-04-07 NOTE — Telephone Encounter (Signed)
Tamara Morales,refused a follow visit at this time.

## 2019-04-15 DIAGNOSIS — B351 Tinea unguium: Secondary | ICD-10-CM | POA: Diagnosis not present

## 2019-04-15 DIAGNOSIS — Q6689 Other  specified congenital deformities of feet: Secondary | ICD-10-CM | POA: Diagnosis not present

## 2019-04-15 DIAGNOSIS — E1159 Type 2 diabetes mellitus with other circulatory complications: Secondary | ICD-10-CM | POA: Diagnosis not present

## 2019-04-15 DIAGNOSIS — L84 Corns and callosities: Secondary | ICD-10-CM | POA: Diagnosis not present

## 2019-04-20 DIAGNOSIS — Z23 Encounter for immunization: Secondary | ICD-10-CM | POA: Diagnosis not present

## 2019-04-25 ENCOUNTER — Telehealth: Payer: Self-pay | Admitting: *Deleted

## 2019-04-25 MED ORDER — FLUCONAZOLE 100 MG PO TABS: 100.0000 mg | ORAL_TABLET | Freq: Every day | ORAL | 0 refills | Status: AC

## 2019-04-25 NOTE — Telephone Encounter (Signed)
Patient notified and agreed.  

## 2019-04-25 NOTE — Telephone Encounter (Signed)
Patient called and stated that she is having severe itching in her private area. Stated that this is on going but it is severe today. Not taking any antibiotics. No available appointments today. Patient does not want to go the weekend with this, stated she feels it is a yeast infection.  Patient stated that in the past Diflucan helps to get rid of it. Patient would like a Rx sent to [facility]. Please Advise.

## 2019-04-25 NOTE — Telephone Encounter (Signed)
Will order Diflucan 100 mg for 5 days. It seems she has used this before.If symptoms persists she has to come for office visit

## 2019-04-28 ENCOUNTER — Other Ambulatory Visit: Payer: Self-pay | Admitting: Internal Medicine

## 2019-05-20 DIAGNOSIS — D2221 Melanocytic nevi of right ear and external auricular canal: Secondary | ICD-10-CM | POA: Diagnosis not present

## 2019-05-20 DIAGNOSIS — L853 Xerosis cutis: Secondary | ICD-10-CM | POA: Diagnosis not present

## 2019-05-20 DIAGNOSIS — Z85828 Personal history of other malignant neoplasm of skin: Secondary | ICD-10-CM | POA: Diagnosis not present

## 2019-05-20 DIAGNOSIS — D1801 Hemangioma of skin and subcutaneous tissue: Secondary | ICD-10-CM | POA: Diagnosis not present

## 2019-05-20 DIAGNOSIS — L304 Erythema intertrigo: Secondary | ICD-10-CM | POA: Diagnosis not present

## 2019-05-20 DIAGNOSIS — I872 Venous insufficiency (chronic) (peripheral): Secondary | ICD-10-CM | POA: Diagnosis not present

## 2019-05-20 DIAGNOSIS — L814 Other melanin hyperpigmentation: Secondary | ICD-10-CM | POA: Diagnosis not present

## 2019-05-20 DIAGNOSIS — L570 Actinic keratosis: Secondary | ICD-10-CM | POA: Diagnosis not present

## 2019-05-26 ENCOUNTER — Other Ambulatory Visit: Payer: Self-pay | Admitting: Internal Medicine

## 2019-05-26 ENCOUNTER — Telehealth: Payer: Self-pay | Admitting: *Deleted

## 2019-05-26 ENCOUNTER — Telehealth: Payer: Self-pay

## 2019-05-26 NOTE — Telephone Encounter (Signed)
Called patient. No answer. LMOM to return call to schedule apnt to be seen in clinic.

## 2019-05-26 NOTE — Telephone Encounter (Signed)
Received CMN for oxygen from [place] with [facility]. Placed form in Dr. Steve Rattler box to review and sign. To be faxed back to [place] Fax: [phone] once completed.

## 2019-05-27 NOTE — Telephone Encounter (Signed)
High risk or very high risk warning populated when attempting to refill medication. RX request sent to PCP for review and approval if warranted.   

## 2019-06-04 ENCOUNTER — Encounter: Payer: Medicare Other | Admitting: Internal Medicine

## 2019-06-23 DIAGNOSIS — Z23 Encounter for immunization: Secondary | ICD-10-CM | POA: Diagnosis not present

## 2019-06-25 ENCOUNTER — Other Ambulatory Visit: Payer: Self-pay | Admitting: Internal Medicine

## 2019-06-25 NOTE — Telephone Encounter (Signed)
Patient requesting refill for medications, medications are not due to be filled until 06/26/2018. Patient also has an appointment with provider 06/26/2018. Pending refills for provider to review and approve.

## 2019-06-27 ENCOUNTER — Non-Acute Institutional Stay: Payer: Medicare Other | Admitting: Internal Medicine

## 2019-06-27 ENCOUNTER — Other Ambulatory Visit: Payer: Self-pay

## 2019-06-27 ENCOUNTER — Encounter: Payer: Self-pay | Admitting: Internal Medicine

## 2019-06-27 VITALS — BP 116/70 | HR 80 | Temp 97.8°F | Ht 62.0 in | Wt 266.2 lb

## 2019-06-27 DIAGNOSIS — I5032 Chronic diastolic (congestive) heart failure: Secondary | ICD-10-CM

## 2019-06-27 DIAGNOSIS — E1169 Type 2 diabetes mellitus with other specified complication: Secondary | ICD-10-CM

## 2019-06-27 DIAGNOSIS — F418 Other specified anxiety disorders: Secondary | ICD-10-CM

## 2019-06-27 DIAGNOSIS — R441 Visual hallucinations: Secondary | ICD-10-CM

## 2019-06-27 DIAGNOSIS — E669 Obesity, unspecified: Secondary | ICD-10-CM

## 2019-06-27 DIAGNOSIS — E538 Deficiency of other specified B group vitamins: Secondary | ICD-10-CM

## 2019-06-27 DIAGNOSIS — I10 Essential (primary) hypertension: Secondary | ICD-10-CM

## 2019-06-27 DIAGNOSIS — R197 Diarrhea, unspecified: Secondary | ICD-10-CM | POA: Diagnosis not present

## 2019-06-27 DIAGNOSIS — E039 Hypothyroidism, unspecified: Secondary | ICD-10-CM

## 2019-06-27 NOTE — Progress Notes (Signed)
Location:  [facility] Service:  Clinic (12)  Provider:   Code Status:  Goals of Care:  Advanced Directives 01/29/2019  Does Patient Have a Medical Advance Directive? Yes  Type of Paramedic of [place];Living will  Does patient want to make changes to medical advance directive? No - Patient declined  Copy of [place] in Chart? Yes - validated most recent copy scanned in chart (See row information)  Would patient like information on creating a medical advance directive? -  Pre-existing out of facility DNR order (yellow form or pink MOST form) -     Chief Complaint  Patient presents with  . Medical Management of Chronic Issues    Patient returns to the clininc for follow up and discuss some hair loss.   [name] Health Maintenance    Foot and eye exam, urine microalbumin, Dexa scan, hemoglobin A1C    HPI: Patient is a 86 y.o. female seen today for medical management of chronic diseases.    Patient has h/o Hypertension, Chronic LE edema with Diastolic CHF and Lymphedema, Anxiety, B12 def,OSA but does not use CPAP only Oxygen , Diabetes Type 2, Diarrhea chronic, Her Acute problems are Hallucinations Has been controlled on Seroquel. Has not had any new episodes since her discharge Chronic diarrhea She does not take Questran anymore symptoms controlled on Imodium as needed Follows with GI as needed Chronic diastolic CHF Doing pretty well Aldactone and metolazone ?  Memory issues Was seen by neurology had an MRI she was generalized atrophy Was starting on Namenda Her MMSE was 30/30 Hair loss This seems to be a new problem.  Patient thinks she is loosing hair  Patient lives by herself in [place] apartment.  Is managing very well with her mood now.  Uses wheelchair for long distance.  Independent in her transfers and ADLs was able to see her family Past Medical History:  Diagnosis Date  . Altered mental status   .  ARTHRITIS, KNEES, BILATERAL   . Breast CA ([place]) 2000   s/p R lumpectomy and XRT  . CHF (congestive heart failure) ([place])   . DEPRESSION   . DJD (degenerative joint disease) of knee   . GERD   . HYPERLIPIDEMIA   . HYPERTENSION   . HYPOTHYROIDISM    postsurgical  . Hypoxia   . MIGRAINE HEADACHE   . Morbid obesity ([place])   . OSA (obstructive sleep apnea) 01/17/2011 dx  . Oxygen dependent   . Urge incontinence   . UTI (lower urinary tract infection) 12/2014    Past Surgical History:  Procedure Laterality Date  . ABDOMINAL HYSTERECTOMY    . ADENOIDECTOMY    . BREAST BIOPSY  2000  . BREAST LUMPECTOMY  2000  . CATARACT EXTRACTION    . CHOLECYSTECTOMY  1995  . CYSTOSCOPY/URETEROSCOPY/HOLMIUM LASER Right 06/28/2015   Procedure: CYSTOSCOPY RIGHT RETROGRAD RIGHT URETEROSCOPY/HOLMIUM LASER WITH RIGHT STENT PLACEMENT;  Surgeon: Alexis Frock, MD;  Location: [facility];  Service: Urology;  Laterality: Right;  . PARATHYROIDECTOMY     2-3 removed  . REPLACEMENT TOTAL KNEE BILATERAL  1999, 2005  . Right leg femur repaired    . THYROIDECTOMY    . TONSILLECTOMY    . TUBAL LIGATION      Allergies  Allergen Reactions  . Azithromycin Itching  . Beta Adrenergic Blockers Other (See Comments)    Depression   . Ciprofloxacin Other (See Comments)    Edgy and very  jumpy    . Codeine Nausea And Vomiting  . Darvon [Propoxyphene] Nausea And Vomiting  . Epinephrine Other (See Comments)    Rapid pulse, sweats  . Klonopin [Clonazepam] Other (See Comments)    Makes her feel very suicidal   . Oxycodone Other (See Comments)    Patient felt like it altered her mental status "Crazy" . Hallucinations later as well.  [name] Paxil [Paroxetine Hydrochloride] Other (See Comments)    Severe depression   . Prednisone Other (See Comments)    "makes me feel really bad"  . Propoxyphene Hcl Nausea And Vomiting  . Tape Itching  . Torsemide Other (See Comments)    Patient stated that she doesn't recall the reaction  .  Tramadol Other (See Comments)    Feels "jittery"  . Vicodin [Hydrocodone-Acetaminophen] Other (See Comments)    Hallucinations  . Meloxicam Diarrhea  . Other Rash    Polyester sheets "cause me a rash"  . Vancomycin Rash    Localized rash related to infusion rate    Outpatient Encounter Medications as of 06/27/2019  Medication Sig  . acetaminophen (TYLENOL) 325 MG tablet Take 2 tablets (650 mg total) by mouth every 8 (eight) hours as needed for mild pain or moderate pain.  [name] aspirin 81 MG tablet Take 81 mg by mouth daily.   [name] aspirin EC 81 MG tablet Take 1 tablet (81 mg total) by mouth daily.  . calcium citrate-vitamin D (CITRACAL+D) 315-200 MG-UNIT tablet Take 1 tablet by mouth daily. 245m tablet daily  . cetirizine (ZYRTEC) 10 MG chewable tablet Chew 1 tablet (10 mg total) by mouth daily as needed for allergies.  . cholestyramine (QUESTRAN) 4 GM/DOSE powder Take 0.5 packets (2 g total) by mouth 2 (two) times daily. Give 4 gm in juice or water once daily in the morning. *Give 2 hours apart from other meds.  . cyanocobalamin 2000 MCG tablet Take 1 tablet (2,000 mcg total) by mouth daily.  .Marland Kitchenlevothyroxine (SYNTHROID) 175 MCG tablet TAKE 1 TABLET ONCE DAILY BEFORE BREAKFAST.  .Marland Kitchenloperamide (IMODIUM) 2 MG capsule Take 1 capsule (2 mg total) by mouth daily as needed for diarrhea or loose stools.  . Melatonin 5 MG TABS Take 1 tablet (5 mg total) by mouth at bedtime.  . memantine (NAMENDA) 10 MG tablet Take 1 tablet (10 mg total) by mouth 2 (two) times daily.  . metolazone (ZAROXOLYN) 2.5 MG tablet TAKE 1 TABLET ONCE DAILY.  .Marland Kitchenomeprazole (PRILOSEC) 20 MG capsule TAKE 1 CAPSULE TWICE DAILY BEFORE MEALS  . ondansetron (ZOFRAN) 4 MG tablet TAKE (1) TABLET BY MOUTH EVERY SIX HOURS AS NEEDED FOR NAUSEA.  .Marland KitchenOXYGEN Inhale 2 L into the lungs continuous. KEEP O2 SATS >= 90%. Every Shift  . polyvinyl alcohol (LIQUIFILM TEARS) 1.4 % ophthalmic solution Place 2 drops into both eyes 4 (four) times daily.  .  potassium chloride (MICRO-K) 10 MEQ CR capsule TAKE 4 CAPSULES DAILY.  .Marland KitchenQUEtiapine (SEROQUEL) 25 MG tablet TAKE 1/2 TABLET AT BEDTIME.  .Marland Kitchensaccharomyces boulardii (FLORASTOR) 250 MG capsule Take 1 capsule (250 mg total) by mouth daily.  . sertraline (ZOLOFT) 100 MG tablet TAKE ONE TABLET AT BEDTIME.  .Marland Kitchenspironolactone (ALDACTONE) 25 MG tablet TAKE 1 TABLET ONCE DAILY.   No facility-administered encounter medications on file as of 06/27/2019.    Review of Systems:  Review of Systems  Review of Systems  Constitutional: Negative for activity change, appetite change, chills, diaphoresis, fatigue and fever.  HENT: Negative for mouth  sores, postnasal drip, rhinorrhea, sinus pain and sore throat.   Respiratory: Negative for apnea, cough, chest tightness, shortness of breath and wheezing.   Cardiovascular: Negative for chest pain, palpitations and leg swelling.  Gastrointestinal: Negative for abdominal distention, abdominal pain, constipation, , nausea and vomiting.  Genitourinary: Negative for dysuria and frequency.  Musculoskeletal: Negative for arthralgias, joint swelling and myalgias.  Skin: Negative for rash.  Neurological: Negative for dizziness, syncope, weakness, light-headedness and numbness.  Psychiatric/Behavioral: Negative for behavioral problems, confusion and sleep disturbance.     Health Maintenance  Topic Date Due  . FOOT EXAM  09/08/1940  . OPHTHALMOLOGY EXAM  09/08/1940  . URINE MICROALBUMIN  09/08/1940  . DEXA SCAN  09/07/1997  . HEMOGLOBIN A1C  11/19/2018  . TETANUS/TDAP  11/13/2028  . INFLUENZA VACCINE  Completed  . PNA vac Low Risk Adult  Completed    Physical Exam: Vitals:   06/27/19 0928  BP: 116/70  Pulse: 80  Temp: 97.8 F (36.6 C)  SpO2: 95%  Weight: 266 lb 3.2 oz (120.7 kg)  Height: _0  (1.575 m)   Body mass index is 48.69 kg/m. Physical Exam  Constitutional: Oriented to person, place, and time. Well-developed and well-nourished.  HENT: No  Alopecia Patches Head: Normocephalic.  Mouth/Throat: Oropharynx is clear and moist.  Eyes: Pupils are equal, round, and reactive to light.  Neck: Neck supple.  Cardiovascular: Normal rate and normal heart sounds.  No murmur heard. Pulmonary/Chest: Effort normal and breath sounds normal. No respiratory distress. No wheezes. She has no rales.  Abdominal: Soft. Bowel sounds are normal. No distension. There is no tenderness. There is no rebound.  Musculoskeletal: Mild edema Bilateral with Chronic Venous changes  Lymphadenopathy: none Neurological: Alert and oriented to person, place, and time. Gait was very Stable Able to get up easily from Exam Table Skin: Skin is warm and dry.  Psychiatric: Normal mood and affect. Behavior is normal. Thought content normal.    Labs reviewed: Basic Metabolic Panel: Recent Labs    07/18/18 0000 08/09/18 0533 08/10/18 0535 08/10/18 0535 10/30/18 0000 11/06/18 1318 11/28/18 0000 12/17/18 0000 12/26/18 0000  NA  --  138 139  --  143 140 141 140  --   K  --  3.8 4.1   < > 3.9 3.9 3.6 4.0  --   CL  --  99 100  --   --  100  --   --   --   CO2  --  30 31  --   --  32  --   --   --   GLUCOSE  --  133* 103*  --   --  95  --   --   --   BUN  --  23 10  --  13 7* 14 14  --   CREATININE  --  0.97 0.75  --  0.9 0.99 0.9 0.9  --   CALCIUM  --  8.1* 7.5*  --   --  7.9*  --   --   --   TSH 5.40*  --   --   --  20.08*  --   --   --  21.20*   < > = values in this interval not displayed.   Liver Function Tests: Recent Labs    08/10/18 0535 10/30/18 0000 11/06/18 1318  AST 21 14 13*  ALT _1 ALKPHOS 79 78 69  BILITOT 0.9  --  0.6  PROT 5.7*  --  5.7*  ALBUMIN 2.8*  --  2.7*   No results for input(s): LIPASE, AMYLASE in the last 8760 hours. No results for input(s): AMMONIA in the last 8760 hours. CBC: Recent Labs    07/05/18 1248 08/09/18 0533 08/10/18 0621 10/30/18 0000 11/06/18 1318  WBC 6.3 7.3 5.4 7.3 6.6  NEUTROABS 4.7 4.6  --   --   4.7  HGB 12.9 12.2 11.5* 12.6 11.7*  HCT 41.0 39.1 38.6 39 37.7  MCV 92.3 96.3 98.2  --  92.2  PLT 142* 129* 133* 132* 140*   Lipid Panel: No results for input(s): CHOL, HDL, LDLCALC, TRIG, CHOLHDL, LDLDIRECT in the last 8760 hours. Lab Results  Component Value Date   HGBA1C 6.5 (H) 05/20/2018    Procedures since last visit: No results found.  Assessment/Plan  Hypothyroidism (acquired) - Plan: TSH, T4, Free TSH was high before  Essential hypertension - Plan: CBC with Differential/Platelets, CMP with eGFR([facility]), Lipid Panel Controlled on Diuretics Chronic diastolic CHF (congestive heart failure) ([facility]) Doing well on Metolazone and Aldactone  Visual hallucinations ?  Neurological Degenerative disorder Per DR Krista Blue She is doing well on Low dose of Seroquel Says had one episode few months ago  Sleeping well I have told her to try it PRN  She also was started on Namenda by Dr Krista Blue MRI showed Moderate Atropy  Diabetes mellitus type 2 in obese ([place]) - Plan: Hemoglobin A1c  Depression with anxiety Doing well on Zoloft  Diarrhea, unspecified type - Plan: Sedimentation Rate, C-reactive Protein These markers were elevated before Her Work up has been negative Does not want to see GI right now Will Recheck the Inflammatory Markers Her symptoms are much improved. Just takes Imodium PRN  Vitamin B12 deficiency - Plan: Vitamin B12  Hair Loss Check CBC and Thyroid Panel Do not see any Signs of Alopecia     Labs/tests ordered:  * No order type specified * Next appt:  Visit date not found   Total time spent in this patient care encounter was  45_  minutes; greater than 50% of the visit spent counseling patient and staff, reviewing records , Labs and coordinating care for problems addressed at this encounter.

## 2019-06-30 DIAGNOSIS — E538 Deficiency of other specified B group vitamins: Secondary | ICD-10-CM | POA: Diagnosis not present

## 2019-06-30 DIAGNOSIS — I10 Essential (primary) hypertension: Secondary | ICD-10-CM | POA: Diagnosis not present

## 2019-06-30 DIAGNOSIS — E039 Hypothyroidism, unspecified: Secondary | ICD-10-CM | POA: Diagnosis not present

## 2019-06-30 DIAGNOSIS — R197 Diarrhea, unspecified: Secondary | ICD-10-CM | POA: Diagnosis not present

## 2019-06-30 DIAGNOSIS — E1169 Type 2 diabetes mellitus with other specified complication: Secondary | ICD-10-CM | POA: Diagnosis not present

## 2019-06-30 DIAGNOSIS — E669 Obesity, unspecified: Secondary | ICD-10-CM | POA: Diagnosis not present

## 2019-07-02 ENCOUNTER — Telehealth: Payer: Self-pay | Admitting: *Deleted

## 2019-07-02 LAB — COMPLETE METABOLIC PANEL WITH GFR
AG Ratio: 1.4 (calc) (ref 1.0–2.5)
ALT: 9 U/L (ref 6–29)
AST: 13 U/L (ref 10–35)
Albumin: 3.5 g/dL — ABNORMAL LOW (ref 3.6–5.1)
Alkaline phosphatase (APISO): 89 U/L (ref 37–153)
BUN/Creatinine Ratio: 21 (calc) (ref 6–22)
BUN: 19 mg/dL (ref 7–25)
CO2: 29 mmol/L (ref 20–32)
Calcium: 8.5 mg/dL — ABNORMAL LOW (ref 8.6–10.4)
Chloride: 102 mmol/L (ref 98–110)
Creat: 0.9 mg/dL — ABNORMAL HIGH (ref 0.60–0.88)
GFR, Est African American: 67 mL/min/{1.73_m2} (ref >=60)
GFR, Est Non African American: 58 mL/min/{1.73_m2} — ABNORMAL LOW (ref >=60)
Globulin: 2.5 g/dL (calc) (ref 1.9–3.7)
Glucose, Bld: 93 mg/dL (ref 65–99)
Potassium: 3.9 mmol/L (ref 3.5–5.3)
Sodium: 142 mmol/L (ref 135–146)
Total Bilirubin: 0.3 mg/dL (ref 0.2–1.2)
Total Protein: 6 g/dL — ABNORMAL LOW (ref 6.1–8.1)

## 2019-07-02 LAB — HEMOGLOBIN A1C
Hgb A1c MFr Bld: 6.1 % of total Hgb — ABNORMAL HIGH (ref <5.7)
Mean Plasma Glucose: 128 (calc)
eAG (mmol/L): 7.1 (calc)

## 2019-07-02 LAB — LIPID PANEL
Cholesterol: 179 mg/dL (ref <200)
HDL: 48 mg/dL — ABNORMAL LOW (ref >=50)
LDL Cholesterol (Calc): 110 mg/dL (calc) — ABNORMAL HIGH
Non-HDL Cholesterol (Calc): 131 mg/dL (calc) — ABNORMAL HIGH (ref <130)
Total CHOL/HDL Ratio: 3.7 (calc) (ref <5.0)
Triglycerides: 105 mg/dL (ref <150)

## 2019-07-02 LAB — VITAMIN B12: Vitamin B-12: >2000 pg/mL — ABNORMAL HIGH (ref 200–1100)

## 2019-07-02 LAB — SEDIMENTATION RATE: Sed Rate: 41 mm/h — ABNORMAL HIGH (ref 0–30)

## 2019-07-02 LAB — T4, FREE: Free T4: 1.6 ng/dL (ref 0.8–1.8)

## 2019-07-02 LAB — C-REACTIVE PROTEIN: CRP: 12.1 mg/L — ABNORMAL HIGH (ref <8.0)

## 2019-07-02 NOTE — Telephone Encounter (Signed)
Forwarded to Dr. Lyndel Safe CMA, Mickey Farber to check with [place]

## 2019-07-02 NOTE — Telephone Encounter (Signed)
Do we know why TSH and CBC was not done on her? I don't see the results. I see the order for it.

## 2019-07-02 NOTE — Telephone Encounter (Signed)
Patient called and stated that she needed to know if her Thyroid medication was going to be changed or not. Stated that she has her medications blistered packed and needs to know because she is due for refills. Please Advise.

## 2019-07-03 NOTE — Telephone Encounter (Signed)
Patient is wanting a call back with results.   Forwarded message to [place].

## 2019-07-07 NOTE — Telephone Encounter (Signed)
Tamara Morales at [facility] states they recieved the reorder on the 7th for #28 and sent the patient #7 tabs to sync with her other meds.   LMOM for patient.

## 2019-07-07 NOTE — Telephone Encounter (Signed)
Patient called requesting to speak with Dr. Steve Rattler CMA. Stated that

## 2019-07-09 ENCOUNTER — Other Ambulatory Visit: Payer: Self-pay | Admitting: Internal Medicine

## 2019-07-15 ENCOUNTER — Other Ambulatory Visit: Payer: Self-pay

## 2019-07-15 DIAGNOSIS — E039 Hypothyroidism, unspecified: Secondary | ICD-10-CM | POA: Diagnosis not present

## 2019-07-15 DIAGNOSIS — I10 Essential (primary) hypertension: Secondary | ICD-10-CM

## 2019-07-15 LAB — CBC WITH DIFFERENTIAL/PLATELET
Absolute Monocytes: 524 cells/uL (ref 200–950)
Basophils Absolute: 62 cells/uL (ref 0–200)
Basophils Relative: 0.9 %
Eosinophils Absolute: 242 cells/uL (ref 15–500)
Eosinophils Relative: 3.5 %
HCT: 40.9 % (ref 35.0–45.0)
Hemoglobin: 13.3 g/dL (ref 11.7–15.5)
Lymphs Abs: 1256 cells/uL (ref 850–3900)
MCH: 28.4 pg (ref 27.0–33.0)
MCHC: 32.5 g/dL (ref 32.0–36.0)
MCV: 87.4 fL (ref 80.0–100.0)
MPV: 13.2 fL — ABNORMAL HIGH (ref 7.5–12.5)
Monocytes Relative: 7.6 %
Neutro Abs: 4816 cells/uL (ref 1500–7800)
Neutrophils Relative %: 69.8 %
Platelets: 158 10*3/uL (ref 140–400)
RBC: 4.68 10*6/uL (ref 3.80–5.10)
RDW: 15.5 % — ABNORMAL HIGH (ref 11.0–15.0)
Total Lymphocyte: 18.2 %
WBC: 6.9 10*3/uL (ref 3.8–10.8)

## 2019-07-15 LAB — TSH: TSH: 0.15 mIU/L — ABNORMAL LOW (ref 0.40–4.50)

## 2019-07-21 DIAGNOSIS — Z23 Encounter for immunization: Secondary | ICD-10-CM | POA: Diagnosis not present

## 2019-07-24 ENCOUNTER — Encounter: Payer: Self-pay | Admitting: Nurse Practitioner

## 2019-07-24 ENCOUNTER — Other Ambulatory Visit: Payer: Self-pay

## 2019-07-24 ENCOUNTER — Other Ambulatory Visit: Payer: Self-pay | Admitting: Internal Medicine

## 2019-07-24 ENCOUNTER — Non-Acute Institutional Stay: Payer: Medicare Other | Admitting: Nurse Practitioner

## 2019-07-24 VITALS — BP 126/78 | HR 87 | Temp 97.5°F | Ht 60.0 in | Wt 253.6 lb

## 2019-07-24 DIAGNOSIS — F418 Other specified anxiety disorders: Secondary | ICD-10-CM | POA: Diagnosis not present

## 2019-07-24 DIAGNOSIS — K589 Irritable bowel syndrome without diarrhea: Secondary | ICD-10-CM

## 2019-07-24 DIAGNOSIS — I890 Lymphedema, not elsewhere classified: Secondary | ICD-10-CM | POA: Diagnosis not present

## 2019-07-24 DIAGNOSIS — E039 Hypothyroidism, unspecified: Secondary | ICD-10-CM | POA: Diagnosis not present

## 2019-07-24 DIAGNOSIS — R441 Visual hallucinations: Secondary | ICD-10-CM | POA: Diagnosis not present

## 2019-07-24 DIAGNOSIS — I5032 Chronic diastolic (congestive) heart failure: Secondary | ICD-10-CM

## 2019-07-24 NOTE — Assessment & Plan Note (Signed)
Occasional loose stools, better, continue Fiber.

## 2019-07-24 NOTE — Progress Notes (Signed)
Location:   clinic [facility]   Place of Service:  Clinic (12) Provider: Marlana Latus NP  Code Status: DNR Goals of Care: [place] 01/29/2019  Does Patient Have a Medical Advance Directive? Yes  Type of Paramedic of [place];Living will  Does patient want to make changes to medical advance directive? No - Patient declined  Copy of [place] in Chart? Yes - validated most recent copy scanned in chart (See row information)  Would patient like information on creating a medical advance directive? -  Pre-existing out of facility DNR order (yellow form or pink MOST form) -     Chief Complaint  Patient presents with  . Medical Management of Chronic Issues    Review thyroid medication. Patient complains of hair loss, brittle nails and skin.     HPI: Patient is a 86 y.o. female seen today for medical management of chronic diseases.    Hx of Hypothyroidism, hair is thinning, skin peeling on the fingers, easily feel cold, no palpitation, occasional diarrhea-on fiber-over a year-better, denied abd pain, sleeps and eats well at her baseline, on Levothyroxine 141mcg qd. Hx of depression/anxiety, stable, on Sertraline $RemoveBefor'100mg'OEsmpsxXeYMn$  qd, Quetiapine 12.$RemoveBeforeDEI'5mg'XxCxnCfwOGWPytLH$  qd. Her memory is preserved on memantine, functioning well in [place] [facility]. CHF/Chronic edema BLE, trace, improved, on Spironolactone $RemoveBeforeDEI'25mg'IBktdQQWNkerbbaP$  qd, Metolazone 2.$RemoveBeforeDE'5mg'pupbZoqjhizblKC$  qd.    Past Medical History:  Diagnosis Date  . Altered mental status   . ARTHRITIS, KNEES, BILATERAL   . Breast CA ([place]) 2000   s/p R lumpectomy and XRT  . CHF (congestive heart failure) ([place])   . DEPRESSION   . DJD (degenerative joint disease) of knee   . GERD   . HYPERLIPIDEMIA   . HYPERTENSION   . HYPOTHYROIDISM    postsurgical  . Hypoxia   . MIGRAINE HEADACHE   . Morbid obesity ([place])   . OSA (obstructive sleep apnea) 01/17/2011 dx  . Oxygen dependent   . Urge incontinence   . UTI (lower urinary tract infection) 12/2014    Past  Surgical History:  Procedure Laterality Date  . ABDOMINAL HYSTERECTOMY    . ADENOIDECTOMY    . BREAST BIOPSY  2000  . BREAST LUMPECTOMY  2000  . CATARACT EXTRACTION    . CHOLECYSTECTOMY  1995  . CYSTOSCOPY/URETEROSCOPY/HOLMIUM LASER Right 06/28/2015   Procedure: CYSTOSCOPY RIGHT RETROGRAD RIGHT URETEROSCOPY/HOLMIUM LASER WITH RIGHT STENT PLACEMENT;  Surgeon: Alexis Frock, MD;  Location: [facility];  Service: Urology;  Laterality: Right;  . PARATHYROIDECTOMY     2-3 removed  . REPLACEMENT TOTAL KNEE BILATERAL  1999, 2005  . Right leg femur repaired    . THYROIDECTOMY    . TONSILLECTOMY    . TUBAL LIGATION      Allergies  Allergen Reactions  . Azithromycin Itching  . Beta Adrenergic Blockers Other (See Comments)    Depression   . Ciprofloxacin Other (See Comments)    Edgy and very jumpy    . Codeine Nausea And Vomiting  . Darvon [Propoxyphene] Nausea And Vomiting  . Epinephrine Other (See Comments)    Rapid pulse, sweats  . Klonopin [Clonazepam] Other (See Comments)    Makes her feel very suicidal   . Oxycodone Other (See Comments)    Patient felt like it altered her mental status "Crazy" . Hallucinations later as well.  [name] Paxil [Paroxetine Hydrochloride] Other (See Comments)    Severe depression   . Prednisone Other (See Comments)    "makes me feel really bad"  .  Propoxyphene Hcl Nausea And Vomiting  . Tape Itching  . Torsemide Other (See Comments)    Patient stated that she doesn't recall the reaction  . Tramadol Other (See Comments)    Feels "jittery"  . Vicodin [Hydrocodone-Acetaminophen] Other (See Comments)    Hallucinations  . Meloxicam Diarrhea  . Other Rash    Polyester sheets "cause me a rash"  . Vancomycin Rash    Localized rash related to infusion rate    Allergies as of 07/24/2019      Reactions   Azithromycin Itching   Beta Adrenergic Blockers Other (See Comments)   Depression   Ciprofloxacin Other (See Comments)   Edgy and very jumpy    Codeine  Nausea And Vomiting   Darvon [propoxyphene] Nausea And Vomiting   Epinephrine Other (See Comments)   Rapid pulse, sweats   Klonopin [clonazepam] Other (See Comments)   Makes her feel very suicidal    Oxycodone Other (See Comments)   Patient felt like it altered her mental status "Crazy" . Hallucinations later as well.   Paxil [paroxetine Hydrochloride] Other (See Comments)   Severe depression    Prednisone Other (See Comments)   "makes me feel really bad"   Propoxyphene Hcl Nausea And Vomiting   Tape Itching   Torsemide Other (See Comments)   Patient stated that she doesn't recall the reaction   Tramadol Other (See Comments)   Feels "jittery"   Vicodin [hydrocodone-acetaminophen] Other (See Comments)   Hallucinations   Meloxicam Diarrhea   Other Rash   Polyester sheets "cause me a rash"   Vancomycin Rash   Localized rash related to infusion rate      Medication List       Accurate as of July 24, 2019 11:59 PM. If you have any questions, ask your nurse or doctor.        STOP taking these medications   cholestyramine 4 GM/DOSE powder Commonly known as: QUESTRAN Stopped by: Tanairi Cypert X Jaken Fregia, NP   polyvinyl alcohol 1.4 % ophthalmic solution Commonly known as: LIQUIFILM TEARS Stopped by: Wendell Fiebig X Darin Arndt, NP     TAKE these medications   acetaminophen 325 MG tablet Commonly known as: TYLENOL Take 2 tablets (650 mg total) by mouth every 8 (eight) hours as needed for mild pain or moderate pain.   aspirin EC 81 MG tablet Take 1 tablet (81 mg total) by mouth daily.   aspirin 81 MG tablet Take 81 mg by mouth daily.   calcium citrate-vitamin D 315-200 MG-UNIT tablet Commonly known as: CITRACAL+D Take 1 tablet by mouth daily. $RemoveBefo'200mg'uLMnNGdgEkT$  tablet daily   cetirizine 10 MG chewable tablet Commonly known as: ZYRTEC Chew 1 tablet (10 mg total) by mouth daily as needed for allergies.   cyanocobalamin 2000 MCG tablet Take 1 tablet (2,000 mcg total) by mouth daily.   FIBER-CAPS PO Take  2 capsules by mouth daily.   GenTeal Tears 0.1-0.2-0.3 % Soln Apply 1 drop to eye. Both eyes   levothyroxine 137 MCG tablet Commonly known as: SYNTHROID Take 1 tablet (137 mcg total) by mouth daily before breakfast. What changed:   medication strength  See the new instructions. Changed by: Sheree Lalla X Jerrett Baldinger, NP   loperamide 2 MG capsule Commonly known as: IMODIUM Take 1 capsule (2 mg total) by mouth daily as needed for diarrhea or loose stools.   Melatonin 5 MG Tabs Take 1 tablet (5 mg total) by mouth at bedtime.   memantine 10 MG tablet Commonly known as: Namenda Take  1 tablet (10 mg total) by mouth 2 (two) times daily.   metolazone 2.5 MG tablet Commonly known as: ZAROXOLYN TAKE 1 TABLET ONCE DAILY.   omeprazole 20 MG capsule Commonly known as: PRILOSEC TAKE 1 CAPSULE TWICE DAILY BEFORE MEALS   ondansetron 4 MG tablet Commonly known as: ZOFRAN TAKE (1) TABLET BY MOUTH EVERY SIX HOURS AS NEEDED FOR NAUSEA.   OXYGEN Inhale 2 L into the lungs continuous. KEEP O2 SATS >= 90%. Every Shift   potassium chloride 10 MEQ CR capsule Commonly known as: MICRO-K TAKE 4 CAPSULES DAILY.   QUEtiapine 25 MG tablet Commonly known as: SEROQUEL TAKE 1/2 TABLET AT BEDTIME.   saccharomyces boulardii 250 MG capsule Commonly known as: FLORASTOR Take 1 capsule (250 mg total) by mouth daily.   sertraline 100 MG tablet Commonly known as: ZOLOFT TAKE ONE TABLET AT BEDTIME.   spironolactone 25 MG tablet Commonly known as: ALDACTONE TAKE 1 TABLET ONCE DAILY.       Review of Systems:  Review of Systems  Constitutional: Negative for activity change, appetite change, chills, diaphoresis, fatigue, fever and unexpected weight change.  HENT: Positive for hearing loss. Negative for congestion and voice change.   Eyes: Negative for visual disturbance.  Respiratory: Negative for cough, shortness of breath and wheezing.   Cardiovascular: Positive for leg swelling. Negative for chest pain and  palpitations.  Gastrointestinal: Positive for diarrhea. Negative for abdominal distention, abdominal pain, constipation, nausea and vomiting.       Chronic, occasionally now.   Endocrine:       Get cold easily  Genitourinary: Negative for difficulty urinating, dysuria and urgency.  Musculoskeletal: Positive for gait problem.  Skin:       C/o hair thinning, skin peeling in fingers  Neurological: Negative for dizziness, speech difficulty, weakness and headaches.  Psychiatric/Behavioral: Negative for agitation, behavioral problems, hallucinations and sleep disturbance. The patient is not nervous/anxious.     Health Maintenance  Topic Date Due  . FOOT EXAM  09/08/1940  . OPHTHALMOLOGY EXAM  09/08/1940  . URINE MICROALBUMIN  09/08/1940  . DEXA SCAN  09/07/1997  . HEMOGLOBIN A1C  12/28/2019  . TETANUS/TDAP  11/13/2028  . INFLUENZA VACCINE  Completed  . PNA vac Low Risk Adult  Completed    Physical Exam: Vitals:   07/24/19 1344  BP: 126/78  Pulse: 87  Temp: (!) 97.5 F (36.4 C)  SpO2: 95%  Weight: 253 lb 9.6 oz (115 kg)  Height: 5' (1.524 m)  HC: 2" (5.1 cm)   Body mass index is 49.53 kg/m. Physical Exam Vitals and nursing note reviewed.  Constitutional:      General: She is not in acute distress.    Appearance: Normal appearance. She is not ill-appearing, toxic-appearing or diaphoretic.  HENT:     Head: Normocephalic and atraumatic.     Nose: Nose normal.     Mouth/Throat:     Mouth: Mucous membranes are moist.  Eyes:     Extraocular Movements: Extraocular movements intact.     Conjunctiva/sclera: Conjunctivae normal.     Pupils: Pupils are equal, round, and reactive to light.  Cardiovascular:     Rate and Rhythm: Normal rate and regular rhythm.     Heart sounds: No murmur.  Pulmonary:     Breath sounds: No wheezing, rhonchi or rales.  Abdominal:     General: Bowel sounds are normal. There is no distension.     Palpations: Abdomen is soft.     Tenderness: There  is no abdominal tenderness. There is no right CVA tenderness, left CVA tenderness, guarding or rebound.  Musculoskeletal:     Cervical back: Normal range of motion and neck supple.     Right lower leg: Edema present.     Left lower leg: Edema present.     Comments: Trace edema BLE  Skin:    General: Skin is warm and dry.     Comments: Brownish venous insufficiency skin changes BLE  Neurological:     General: No focal deficit present.     Mental Status: She is alert. Mental status is at baseline.     Motor: No weakness.     Coordination: Coordination normal.     Gait: Gait abnormal.  Psychiatric:        Mood and Affect: Mood normal.        Behavior: Behavior normal.        Thought Content: Thought content normal.        Judgment: Judgment normal.     Labs reviewed: Basic Metabolic Panel: Recent Labs    08/10/18 0535 08/10/18 0535 10/30/18 0000 10/30/18 0000 11/06/18 1318 11/28/18 0000 12/17/18 0000 12/26/18 0000 06/30/19 1007 07/15/19 0730  NA 139   < > 143   < > 140 141 140  --  142  --   K 4.1   < > 3.9   < > 3.9 3.6 4.0  --  3.9  --   CL 100  --   --   --  100  --   --   --  102  --   CO2 31  --   --   --  32  --   --   --  29  --   GLUCOSE 103*  --   --   --  95  --   --   --  93  --   BUN 10   < > 13   < > 7* 14 14  --  19  --   CREATININE 0.75   < > 0.9   < > 0.99 0.9 0.9  --  0.90*  --   CALCIUM 7.5*  --   --   --  7.9*  --   --   --  8.5*  --   TSH  --   --  20.08*  --   --   --   --  21.20*  --  0.15*   < > = values in this interval not displayed.   Liver Function Tests: Recent Labs    08/10/18 0535 08/10/18 0535 10/30/18 0000 11/06/18 1318 06/30/19 1007  AST 21   < > 14 13* 13  ALT 11   < > $R'8 10 9  'Ge$ ALKPHOS 79  --  78 69  --   BILITOT 0.9  --   --  0.6 0.3  PROT 5.7*  --   --  5.7* 6.0*  ALBUMIN 2.8*  --   --  2.7*  --    < > = values in this interval not displayed.   No results for input(s): LIPASE, AMYLASE in the last 8760 hours. No results  for input(s): AMMONIA in the last 8760 hours. CBC: Recent Labs    08/09/18 0533 08/09/18 0533 08/10/18 0621 08/10/18 0621 10/30/18 0000 11/06/18 1318 07/15/19 0730  WBC 7.3   < > 5.4  --  7.3 6.6 6.9  NEUTROABS 4.6  --   --   --   --  4.7 4,816  HGB 12.2   < > 11.5*   < > 12.6 11.7* 13.3  HCT 39.1   < > 38.6   < > 39 37.7 40.9  MCV 96.3   < > 98.2  --   --  92.2 87.4  PLT 129*   < > 133*   < > 132* 140* 158   < > = values in this interval not displayed.   Lipid Panel: Recent Labs    06/30/19 1007  CHOL 179  HDL 48*  LDLCALC 110*  TRIG 105  CHOLHDL 3.7   Lab Results  Component Value Date   HGBA1C 6.1 (H) 06/30/2019    Procedures since last visit: No results found.  Assessment/Plan  Hypothyroidism (acquired) TSH 0.15 07/15/19, trended down from 20s about 6 months ago, will decrease Levothyroxine to 122mcg qd, update TSH/free T4 in 8 weeks.   Chronic diastolic CHF (congestive heart failure) (HCC) Compensated, continue Spironolactone, Metolazone.   IBS (irritable bowel syndrome) Occasional loose stools, better, continue Fiber.   Visual hallucinations Stable, continue Quetiapine.   Lymphedema Improved, continue Spironolactone, Metolazone.   Depression with anxiety Stable, continue Sertraline, Quetiapine.    Labs/tests ordered:  TSH free T4 8 weeks.   Next appt:  10/02/2019

## 2019-07-24 NOTE — Assessment & Plan Note (Signed)
TSH 0.15 07/15/19, trended down from 20s about 6 months ago, will decrease Levothyroxine to 167mcg qd, update TSH/free T4 in 8 weeks.

## 2019-07-24 NOTE — Assessment & Plan Note (Signed)
Improved, continue Spironolactone, Metolazone.

## 2019-07-24 NOTE — Assessment & Plan Note (Signed)
Compensated, continue Spironolactone, Metolazone.

## 2019-07-24 NOTE — Assessment & Plan Note (Signed)
Stable, continue Quetiapine.

## 2019-07-24 NOTE — Patient Instructions (Addendum)
Your Levothyroxine was decreased to 143mcg daily by mouth due to your recent TSH was low. Will update TSH free T4 in 8 weeks

## 2019-07-24 NOTE — Assessment & Plan Note (Signed)
Stable, continue Sertraline, Quetiapine.

## 2019-07-25 ENCOUNTER — Other Ambulatory Visit: Payer: Self-pay | Admitting: *Deleted

## 2019-07-25 ENCOUNTER — Encounter: Payer: Self-pay | Admitting: Nurse Practitioner

## 2019-07-25 ENCOUNTER — Other Ambulatory Visit: Payer: Self-pay | Admitting: Internal Medicine

## 2019-07-25 MED ORDER — LEVOTHYROXINE SODIUM 137 MCG PO TABS: 137.0000 ug | ORAL_TABLET | Freq: Every day | ORAL | 2 refills | Status: DC

## 2019-07-25 MED ORDER — LEVOTHYROXINE SODIUM 137 MCG PO TABS: 137.0000 ug | ORAL_TABLET | Freq: Every day | ORAL | 0 refills | Status: DC

## 2019-07-25 NOTE — Telephone Encounter (Signed)
Alert for use of spironolactone and potassium.  Levothyroxine is currently not on med list.  Note from yesterday states will decrease dose to 137 mcg. [facility] med list still showing 175 mcg.

## 2019-07-25 NOTE — Telephone Encounter (Signed)
Received Fax from [facility] for new Rx with new Levothyroxine dosage.  Updated medication list and faxed to pharmacy.   Per [facility] Mast:   Assessment/Plan  Hypothyroidism (acquired) TSH 0.15 07/15/19, trended down from 20s about 6 months ago, will decrease Levothyroxine to 127mcg qd, update TSH/free T4 in 8 weeks.

## 2019-07-31 ENCOUNTER — Other Ambulatory Visit: Payer: Self-pay | Admitting: Internal Medicine

## 2019-08-04 ENCOUNTER — Other Ambulatory Visit: Payer: Self-pay | Admitting: Internal Medicine

## 2019-08-20 ENCOUNTER — Other Ambulatory Visit: Payer: Self-pay | Admitting: Nurse Practitioner

## 2019-08-20 ENCOUNTER — Other Ambulatory Visit: Payer: Self-pay | Admitting: Internal Medicine

## 2019-08-20 DIAGNOSIS — E039 Hypothyroidism, unspecified: Secondary | ICD-10-CM

## 2019-08-20 NOTE — Telephone Encounter (Signed)
High risk or very high risk warning populated when attempting to refill medication. RX request sent to PCP for review and approval if warranted.   

## 2019-08-22 ENCOUNTER — Other Ambulatory Visit: Payer: Self-pay | Admitting: Nurse Practitioner

## 2019-08-22 DIAGNOSIS — E039 Hypothyroidism, unspecified: Secondary | ICD-10-CM

## 2019-08-25 ENCOUNTER — Telehealth: Payer: Self-pay | Admitting: *Deleted

## 2019-08-25 NOTE — Telephone Encounter (Signed)
Patient called and stated that she needed a refill on her Thyroid medication. Stated that she spoke with Raquel Sarna at [facility] and she stated that we denied it because "refill was not appropriate"  I called and spoke with [facility] and they pulled the Rx and they had only filled #28 and Rx was written for #60. Stated that they will fill Rx and contact the patient today.

## 2019-09-17 ENCOUNTER — Other Ambulatory Visit: Payer: Self-pay | Admitting: Nurse Practitioner

## 2019-09-17 DIAGNOSIS — E039 Hypothyroidism, unspecified: Secondary | ICD-10-CM

## 2019-09-17 NOTE — Telephone Encounter (Signed)
Patient had labs done today. Patient has upcoming appointment with Mast, Man, NP 10/02/2019. Medication is being pended just incase medication needs changes. Please Advise.

## 2019-09-18 ENCOUNTER — Other Ambulatory Visit: Payer: Self-pay

## 2019-09-18 DIAGNOSIS — E039 Hypothyroidism, unspecified: Secondary | ICD-10-CM

## 2019-09-18 LAB — T4, FREE: Free T4: 1 ng/dL (ref 0.8–1.8)

## 2019-09-18 LAB — TSH: TSH: 3.58 mIU/L (ref 0.40–4.50)

## 2019-09-23 ENCOUNTER — Other Ambulatory Visit: Payer: Self-pay

## 2019-09-23 DIAGNOSIS — E039 Hypothyroidism, unspecified: Secondary | ICD-10-CM

## 2019-09-30 DIAGNOSIS — I872 Venous insufficiency (chronic) (peripheral): Secondary | ICD-10-CM | POA: Diagnosis not present

## 2019-09-30 DIAGNOSIS — L820 Inflamed seborrheic keratosis: Secondary | ICD-10-CM | POA: Diagnosis not present

## 2019-09-30 DIAGNOSIS — L570 Actinic keratosis: Secondary | ICD-10-CM | POA: Diagnosis not present

## 2019-10-02 ENCOUNTER — Non-Acute Institutional Stay: Payer: Medicare Other | Admitting: Nurse Practitioner

## 2019-10-02 ENCOUNTER — Encounter: Payer: Self-pay | Admitting: Nurse Practitioner

## 2019-10-02 ENCOUNTER — Other Ambulatory Visit: Payer: Self-pay

## 2019-10-02 DIAGNOSIS — I10 Essential (primary) hypertension: Secondary | ICD-10-CM

## 2019-10-02 DIAGNOSIS — R21 Rash and other nonspecific skin eruption: Secondary | ICD-10-CM | POA: Diagnosis not present

## 2019-10-02 DIAGNOSIS — I5032 Chronic diastolic (congestive) heart failure: Secondary | ICD-10-CM | POA: Diagnosis not present

## 2019-10-02 DIAGNOSIS — K219 Gastro-esophageal reflux disease without esophagitis: Secondary | ICD-10-CM

## 2019-10-02 DIAGNOSIS — E538 Deficiency of other specified B group vitamins: Secondary | ICD-10-CM | POA: Diagnosis not present

## 2019-10-02 DIAGNOSIS — R4189 Other symptoms and signs involving cognitive functions and awareness: Secondary | ICD-10-CM

## 2019-10-02 DIAGNOSIS — F0150 Vascular dementia without behavioral disturbance: Secondary | ICD-10-CM | POA: Insufficient documentation

## 2019-10-02 DIAGNOSIS — E039 Hypothyroidism, unspecified: Secondary | ICD-10-CM

## 2019-10-02 DIAGNOSIS — F419 Anxiety disorder, unspecified: Secondary | ICD-10-CM | POA: Diagnosis not present

## 2019-10-02 MED ORDER — NYSTATIN-TRIAMCINOLONE 100000-0.1 UNIT/GM-% EX OINT: 1.0000 "application " | TOPICAL_OINTMENT | Freq: Two times a day (BID) | CUTANEOUS | 5 refills | Status: AC

## 2019-10-02 NOTE — Assessment & Plan Note (Signed)
06/2019 Vit B12 >2000, will reduce Vit B12 to 1064mcg/2000mcg po qd, repeat Vit B12 level in 3 months.

## 2019-10-02 NOTE — Patient Instructions (Addendum)
F/u Dr. Lyndel Safe in 4 months. Vit B12 level in 3 months.

## 2019-10-02 NOTE — Assessment & Plan Note (Signed)
Stable, TSH 3.58 09/18/19, continue Synthroid.

## 2019-10-02 NOTE — Assessment & Plan Note (Signed)
Blood pressure is controlled

## 2019-10-02 NOTE — Assessment & Plan Note (Signed)
Compensated, continue Spironolactone, Metolazone.

## 2019-10-02 NOTE — Assessment & Plan Note (Signed)
In left armpit, dry, clean, apply Nystatin bid until heald.

## 2019-10-02 NOTE — Progress Notes (Signed)
Location:   clinic [place]   Place of Service:  Clinic (12) Provider: Marlana Latus NP  Code Status: DNR Goals of Care: [place] 01/29/2019  Does Patient Have a Medical Advance Directive? Yes  Type of Paramedic of [place];Living will  Does patient want to make changes to medical advance directive? No - Patient declined  Copy of [place] in Chart? Yes - validated most recent copy scanned in chart (See row information)  Would patient like information on creating a medical advance directive? -  Pre-existing out of facility DNR order (yellow form or pink MOST form) -     Chief Complaint  Patient presents with  . Medical Management of Chronic Issues    Patient would like to discuss the knot under left arm , she noticed about 2-3 days ago.    HPI: Patient is a 86 y.o. female seen today for medical management of chronic diseases.     The patient has history of Hypothyroidism, on Synthroid 149mcg qd. BLE edema stable, on Spironolactone $RemoveBeforeDEI'25mg'qcoHAmKHkDiKZKoh$  qd, Metolazone 2.$RemoveBeforeDE'5mg'ijTxWMuauGSLNlU$  qd. Her mood is stable on Sertraline $RemoveBefor'100mg'xMLRuafqECQC$  qd, quetiapine $RemoveBefore'25mg'OzAaVmpsWGwSR$  qd. GERD, stable, on Omeprazole $RemoveBefor'20mg'ktTFHkZCVNxq$  qd. She is functional well in [place] [facility], on Memantine for memory.   Past Medical History:  Diagnosis Date  . Altered mental status   . ARTHRITIS, KNEES, BILATERAL   . Breast CA ([place]) 2000   s/p R lumpectomy and XRT  . CHF (congestive heart failure) ([place])   . DEPRESSION   . DJD (degenerative joint disease) of knee   . GERD   . HYPERLIPIDEMIA   . HYPERTENSION   . HYPOTHYROIDISM    postsurgical  . Hypoxia   . MIGRAINE HEADACHE   . Morbid obesity ([place])   . OSA (obstructive sleep apnea) 01/17/2011 dx  . Oxygen dependent   . Urge incontinence   . UTI (lower urinary tract infection) 12/2014    Past Surgical History:  Procedure Laterality Date  . ABDOMINAL HYSTERECTOMY    . ADENOIDECTOMY    . BREAST BIOPSY  2000  . BREAST LUMPECTOMY  2000  . CATARACT EXTRACTION    .  CHOLECYSTECTOMY  1995  . CYSTOSCOPY/URETEROSCOPY/HOLMIUM LASER Right 06/28/2015   Procedure: CYSTOSCOPY RIGHT RETROGRAD RIGHT URETEROSCOPY/HOLMIUM LASER WITH RIGHT STENT PLACEMENT;  Surgeon: Alexis Frock, MD;  Location: [facility];  Service: Urology;  Laterality: Right;  . PARATHYROIDECTOMY     2-3 removed  . REPLACEMENT TOTAL KNEE BILATERAL  1999, 2005  . Right leg femur repaired    . THYROIDECTOMY    . TONSILLECTOMY    . TUBAL LIGATION      Allergies  Allergen Reactions  . Azithromycin Itching  . Beta Adrenergic Blockers Other (See Comments)    Depression   . Ciprofloxacin Other (See Comments)    Edgy and very jumpy    . Codeine Nausea And Vomiting  . Darvon [Propoxyphene] Nausea And Vomiting  . Epinephrine Other (See Comments)    Rapid pulse, sweats  . Klonopin [Clonazepam] Other (See Comments)    Makes her feel very suicidal   . Oxycodone Other (See Comments)    Patient felt like it altered her mental status "Crazy" . Hallucinations later as well.  [name] Paxil [Paroxetine Hydrochloride] Other (See Comments)    Severe depression   . Prednisone Other (See Comments)    "makes me feel really bad"  . Propoxyphene Hcl Nausea And Vomiting  . Tape Itching  . Torsemide Other (See Comments)  Patient stated that she doesn't recall the reaction  . Tramadol Other (See Comments)    Feels "jittery"  . Vicodin [Hydrocodone-Acetaminophen] Other (See Comments)    Hallucinations  . Meloxicam Diarrhea  . Other Rash    Polyester sheets "cause me a rash"  . Vancomycin Rash    Localized rash related to infusion rate    Allergies as of 10/02/2019      Reactions   Azithromycin Itching   Beta Adrenergic Blockers Other (See Comments)   Depression   Ciprofloxacin Other (See Comments)   Edgy and very jumpy    Codeine Nausea And Vomiting   Darvon [propoxyphene] Nausea And Vomiting   Epinephrine Other (See Comments)   Rapid pulse, sweats   Klonopin [clonazepam] Other (See Comments)   Makes  her feel very suicidal    Oxycodone Other (See Comments)   Patient felt like it altered her mental status "Crazy" . Hallucinations later as well.   Paxil [paroxetine Hydrochloride] Other (See Comments)   Severe depression    Prednisone Other (See Comments)   "makes me feel really bad"   Propoxyphene Hcl Nausea And Vomiting   Tape Itching   Torsemide Other (See Comments)   Patient stated that she doesn't recall the reaction   Tramadol Other (See Comments)   Feels "jittery"   Vicodin [hydrocodone-acetaminophen] Other (See Comments)   Hallucinations   Meloxicam Diarrhea   Other Rash   Polyester sheets "cause me a rash"   Vancomycin Rash   Localized rash related to infusion rate      Medication List       Accurate as of October 02, 2019 11:59 PM. If you have any questions, ask your nurse or doctor.        acetaminophen 325 MG tablet Commonly known as: TYLENOL Take 2 tablets (650 mg total) by mouth every 8 (eight) hours as needed for mild pain or moderate pain.   aspirin 81 MG tablet Take 81 mg by mouth daily. What changed: Another medication with the same name was removed. Continue taking this medication, and follow the directions you see here. Changed by: Delesa Kawa X Yaasir Menken, NP   calcium citrate-vitamin D 315-200 MG-UNIT tablet Commonly known as: CITRACAL+D Take 1 tablet by mouth daily. $RemoveBefo'200mg'RApMaXYCfpT$  tablet daily   cetirizine 10 MG chewable tablet Commonly known as: ZYRTEC Chew 1 tablet (10 mg total) by mouth daily as needed for allergies.   cyanocobalamin 2000 MCG tablet TAKE 2 TABLETS EACH MORNING.   FIBER-CAPS PO Take 2 capsules by mouth daily.   GenTeal Tears 0.1-0.2-0.3 % Soln Apply 1 drop to eye. Both eyes   levothyroxine 137 MCG tablet Commonly known as: SYNTHROID Take 1 tablet (137 mcg total) by mouth daily before breakfast. What changed: Another medication with the same name was removed. Continue taking this medication, and follow the directions you see here. Changed  by: Monterrius Cardosa X Lissie Hinesley, NP   loperamide 2 MG capsule Commonly known as: IMODIUM Take 1 capsule (2 mg total) by mouth daily as needed for diarrhea or loose stools.   melatonin 5 MG Tabs Take 1 tablet (5 mg total) by mouth at bedtime.   memantine 10 MG tablet Commonly known as: Namenda Take 1 tablet (10 mg total) by mouth 2 (two) times daily. What changed: when to take this   metolazone 2.5 MG tablet Commonly known as: ZAROXOLYN TAKE 1 TABLET ONCE DAILY.   nystatin-triamcinolone ointment Commonly known as: MYCOLOG Apply 1 application topically 2 (two) times daily. Started  by: Shawntez Dickison X Donzel Romack, NP   omeprazole 20 MG capsule Commonly known as: PRILOSEC TAKE 1 CAPSULE TWICE DAILY BEFORE MEALS   ondansetron 4 MG tablet Commonly known as: ZOFRAN TAKE (1) TABLET BY MOUTH EVERY SIX HOURS AS NEEDED FOR NAUSEA.   OXYGEN Inhale 2 L into the lungs continuous. KEEP O2 SATS >= 90%. Every Shift   potassium chloride 10 MEQ CR capsule Commonly known as: MICRO-K TAKE 4 CAPSULES DAILY.   QUEtiapine 25 MG tablet Commonly known as: SEROQUEL TAKE 1/2 TABLET AT BEDTIME.   saccharomyces boulardii 250 MG capsule Commonly known as: FLORASTOR Take 1 capsule (250 mg total) by mouth daily.   sertraline 100 MG tablet Commonly known as: ZOLOFT TAKE ONE TABLET AT BEDTIME.   spironolactone 25 MG tablet Commonly known as: ALDACTONE TAKE 1 TABLET ONCE DAILY.       Review of Systems:  Review of Systems  Constitutional: Negative for activity change, appetite change, fatigue, fever and unexpected weight change.  HENT: Positive for hearing loss. Negative for congestion and voice change.   Eyes: Negative for visual disturbance.  Respiratory: Negative for cough and shortness of breath.   Cardiovascular: Positive for leg swelling. Negative for chest pain and palpitations.  Gastrointestinal: Positive for diarrhea. Negative for abdominal distention, abdominal pain and constipation.       Chronic, occasionally  now.   Endocrine:       Get cold easily  Genitourinary: Negative for difficulty urinating, dysuria and urgency.  Musculoskeletal: Positive for gait problem.  Skin: Positive for rash.       Rash in the left armpit.   Neurological: Negative for speech difficulty, weakness, light-headedness and headaches.  Psychiatric/Behavioral: Negative for agitation, behavioral problems and sleep disturbance.    Health Maintenance  Topic Date Due  . FOOT EXAM  Never done  . OPHTHALMOLOGY EXAM  Never done  . URINE MICROALBUMIN  Never done  . DEXA SCAN  Never done  . HEMOGLOBIN A1C  12/28/2019  . INFLUENZA VACCINE  01/18/2020  . TETANUS/TDAP  11/13/2028  . PNA vac Low Risk Adult  Completed    Physical Exam: Vitals:   10/02/19 1420  BP: 118/76  Pulse: 94  Temp: 97.8 F (36.6 C)  SpO2: 94%  Weight: 272 lb (123.4 kg)  Height: 5' (1.524 m)   Body mass index is 53.12 kg/m. Physical Exam Vitals and nursing note reviewed.  Constitutional:      Appearance: Normal appearance.  HENT:     Head: Normocephalic and atraumatic.     Nose: Nose normal.     Mouth/Throat:     Mouth: Mucous membranes are moist.  Eyes:     Extraocular Movements: Extraocular movements intact.     Conjunctiva/sclera: Conjunctivae normal.     Pupils: Pupils are equal, round, and reactive to light.  Cardiovascular:     Rate and Rhythm: Normal rate and regular rhythm.     Heart sounds: No murmur.  Pulmonary:     Breath sounds: No rales.  Abdominal:     General: Bowel sounds are normal. There is no distension.     Palpations: Abdomen is soft.     Tenderness: There is no abdominal tenderness.  Musculoskeletal:     Cervical back: Normal range of motion and neck supple.     Right lower leg: Edema present.     Left lower leg: Edema present.     Comments: Trace edema BLE  Skin:    General: Skin is warm and dry.  Findings: Rash present.     Comments: Brownish venous insufficiency skin changes BLE. Redness in the  left armpit.   Neurological:     General: No focal deficit present.     Mental Status: She is alert. Mental status is at baseline.     Motor: No weakness.     Coordination: Coordination normal.     Gait: Gait abnormal.  Psychiatric:        Mood and Affect: Mood normal.        Behavior: Behavior normal.        Thought Content: Thought content normal.        Judgment: Judgment normal.     Labs reviewed: Basic Metabolic Panel: Recent Labs    10/30/18 0000 11/06/18 1318 11/28/18 0000 12/17/18 0000 12/26/18 0000 06/30/19 1007 07/15/19 0730 09/17/19 0835  NA   < > 140 141 140  --  142  --   --   K   < > 3.9 3.6 4.0  --  3.9  --   --   CL  --  100  --   --   --  102  --   --   CO2  --  32  --   --   --  29  --   --   GLUCOSE  --  95  --   --   --  93  --   --   BUN   < > 7* 14 14  --  19  --   --   CREATININE   < > 0.99 0.9 0.9  --  0.90*  --   --   CALCIUM  --  7.9*  --   --   --  8.5*  --   --   TSH   < >  --   --   --  21.20*  --  0.15* 3.58   < > = values in this interval not displayed.   Liver Function Tests: Recent Labs    10/30/18 0000 11/06/18 1318 06/30/19 1007  AST 14 13* 13  ALT $Re'8 10 9  'bvv$ ALKPHOS 78 69  --   BILITOT  --  0.6 0.3  PROT  --  5.7* 6.0*  ALBUMIN  --  2.7*  --    No results for input(s): LIPASE, AMYLASE in the last 8760 hours. No results for input(s): AMMONIA in the last 8760 hours. CBC: Recent Labs    10/30/18 0000 11/06/18 1318 07/15/19 0730  WBC 7.3 6.6 6.9  NEUTROABS  --  4.7 4,816  HGB 12.6 11.7* 13.3  HCT 39 37.7 40.9  MCV  --  92.2 87.4  PLT 132* 140* 158   Lipid Panel: Recent Labs    06/30/19 1007  CHOL 179  HDL 48*  LDLCALC 110*  TRIG 105  CHOLHDL 3.7   Lab Results  Component Value Date   HGBA1C 6.1 (H) 06/30/2019    Procedures since last visit: No results found.  Assessment/Plan  Chronic diastolic CHF (congestive heart failure) (HCC) Compensated, continue Spironolactone, Metolazone.   Essential  hypertension Blood pressure is controlled.   Acid reflux Stable, continue Omeprazole.   Hypothyroidism (acquired) Stable, TSH 3.58 09/18/19, continue Synthroid.   Anxiety Her mood is stable, continue Sertraline, Quetiapine.   Cognitive impairment High functional, continue Memantine.   Rash in adult In left armpit, dry, clean, apply Nystatin bid until heald.   Vitamin B12 deficiency 06/2019 Vit B12 >2000, will reduce Vit B12  to 1060mcg/2000mcg po qd, repeat Vit B12 level in 3 months.    Labs/tests ordered: Vit B12 level   Next appt:  12/23/2019

## 2019-10-02 NOTE — Assessment & Plan Note (Signed)
High functional, continue Memantine.

## 2019-10-02 NOTE — Assessment & Plan Note (Signed)
Stable, continue Omeprazole.  

## 2019-10-02 NOTE — Assessment & Plan Note (Signed)
Her mood is stable, continue Sertraline, Quetiapine.

## 2019-10-03 ENCOUNTER — Encounter: Payer: Self-pay | Admitting: Nurse Practitioner

## 2019-10-07 DIAGNOSIS — L84 Corns and callosities: Secondary | ICD-10-CM | POA: Diagnosis not present

## 2019-10-07 DIAGNOSIS — E1159 Type 2 diabetes mellitus with other circulatory complications: Secondary | ICD-10-CM | POA: Diagnosis not present

## 2019-10-07 DIAGNOSIS — B351 Tinea unguium: Secondary | ICD-10-CM | POA: Diagnosis not present

## 2019-10-08 ENCOUNTER — Telehealth: Payer: Self-pay

## 2019-10-08 NOTE — Telephone Encounter (Signed)
Patient called to ask for appointment to be seen because she is having some bleeding she didn't mention from where exactly but from what she said I believe it was from her private areas I let her talk to Judson Roch and she made an appointment for her for 10/09/2019 $RemoveBefor'@1'CUCaseQIhMoN$ :00 as she wanted to be seen as soon as possible

## 2019-10-09 ENCOUNTER — Non-Acute Institutional Stay: Payer: Medicare Other | Admitting: Nurse Practitioner

## 2019-10-09 ENCOUNTER — Encounter: Payer: Self-pay | Admitting: Nurse Practitioner

## 2019-10-09 ENCOUNTER — Other Ambulatory Visit: Payer: Self-pay

## 2019-10-09 DIAGNOSIS — I10 Essential (primary) hypertension: Secondary | ICD-10-CM

## 2019-10-09 DIAGNOSIS — K649 Unspecified hemorrhoids: Secondary | ICD-10-CM

## 2019-10-09 DIAGNOSIS — E039 Hypothyroidism, unspecified: Secondary | ICD-10-CM

## 2019-10-09 DIAGNOSIS — I890 Lymphedema, not elsewhere classified: Secondary | ICD-10-CM | POA: Diagnosis not present

## 2019-10-09 DIAGNOSIS — K219 Gastro-esophageal reflux disease without esophagitis: Secondary | ICD-10-CM | POA: Diagnosis not present

## 2019-10-09 DIAGNOSIS — K625 Hemorrhage of anus and rectum: Secondary | ICD-10-CM | POA: Diagnosis not present

## 2019-10-09 NOTE — Assessment & Plan Note (Signed)
No active bleeding or injury.

## 2019-10-09 NOTE — Progress Notes (Signed)
Location:   clinic [place]   Place of Service:  Clinic (12) Provider: Marlana Latus NP  Code Status: DNR Goals of Care:  Advanced Directives 01/29/2019  Does Patient Have a Medical Advance Directive? Yes  Type of Paramedic of [place];Living will  Does patient want to make changes to medical advance directive? No - Patient declined  Copy of [place] in Chart? Yes - validated most recent copy scanned in chart (See row information)  Would patient like information on creating a medical advance directive? -  Pre-existing out of facility DNR order (yellow form or pink MOST form) -     Chief Complaint  Patient presents with  . Acute Visit    Rectal bleeding for the last 2 days. Right ear feels clogged.    HPI: Patient is a 86 y.o. female seen today for an acute visit for c/o rectal bleed, smear on toilet paper day before yesterday only. She reported nausea yesterday, resolved after x1 Zofran, no vomiting, diarrhea, constipation, or abd pain, rectal pain, dysuria, chest pain/pressure, or palpitation. Also she reported mild headache for 2 days, no change of vision, focal weakness, sore throat, or sinus pain.   GERD, stable, on Omeprazole, prn Zofran.  HTN, controlled, on ASA.  Edema BLE, on Spironolactone 47m qd, Metolazone 2.563mqd. Hypothyroidism, TSH 3.58 09/18/19, on Levothyroxine.  Past Medical History:  Diagnosis Date  . Altered mental status   . ARTHRITIS, KNEES, BILATERAL   . Breast CA ([id number]   s/p R lumpectomy and XRT  . [place]ive [place] failure) [place]  . DEPRESSION   . DJD (degenerative joint disease) of knee   . GERD   . HYPERLIPIDEMIA   . HYPERTENSION   . HYPOTHYROIDISM    postsurgical  . Hypoxia   . MIGRAINE HEADACHE   . Morbid obesity ([place]  . OSA (obstructive sleep apnea) 01/17/2011 dx  . Oxygen dependent   . Urge incontinence   . UTI (lower urinary tract infection) 12/2014    Past Surgical History:  Procedure  Laterality Date  . ABDOMINAL HYSTERECTOMY    . ADENOIDECTOMY    . BREAST BIOPSY  2000  . BREAST LUMPECTOMY  2000  . CATARACT EXTRACTION    . CHOLECYSTECTOMY  1995  . CYSTOSCOPY/URETEROSCOPY/HOLMIUM LASER Right 06/28/2015   Procedure: CYSTOSCOPY RIGHT RETROGRAD RIGHT URETEROSCOPY/HOLMIUM LASER WITH RIGHT STENT PLACEMENT;  Surgeon: ThAlexis FrockMD;  Location: [facility];  Service: Urology;  Laterality: Right;  . PARATHYROIDECTOMY     2-3 removed  . REPLACEMENT TOTAL KNEE BILATERAL  1999, 2005  . Right leg femur repaired    . THYROIDECTOMY    . TONSILLECTOMY    . TUBAL LIGATION      Allergies  Allergen Reactions  . Azithromycin Itching  . Beta Adrenergic Blockers Other (See Comments)    Depression   . Ciprofloxacin Other (See Comments)    Edgy and very jumpy    . Codeine Nausea And Vomiting  . Darvon [Propoxyphene] Nausea And Vomiting  . Epinephrine Other (See Comments)    Rapid pulse, sweats  . Klonopin [Clonazepam] Other (See Comments)    Makes her feel very suicidal   . Oxycodone Other (See Comments)    Patient felt like it altered her mental status "Crazy" . Hallucinations later as well.  . Marland Kitchenaxil [Paroxetine Hydrochloride] Other (See Comments)    Severe depression   . Prednisone Other (See Comments)    "makes me feel really  bad"  . Propoxyphene Hcl Nausea And Vomiting  . Tape Itching  . Torsemide Other (See Comments)    Patient stated that she doesn't recall the reaction  . Tramadol Other (See Comments)    Feels "jittery"  . Vicodin [Hydrocodone-Acetaminophen] Other (See Comments)    Hallucinations  . Meloxicam Diarrhea  . Other Rash    Polyester sheets "cause me a rash"  . Vancomycin Rash    Localized rash related to infusion rate    Allergies as of 10/09/2019      Reactions   Azithromycin Itching   Beta Adrenergic Blockers Other (See Comments)   Depression   Ciprofloxacin Other (See Comments)   Edgy and very jumpy    Codeine Nausea And Vomiting   Darvon  [propoxyphene] Nausea And Vomiting   Epinephrine Other (See Comments)   Rapid pulse, sweats   Klonopin [clonazepam] Other (See Comments)   Makes her feel very suicidal    Oxycodone Other (See Comments)   Patient felt like it altered her mental status "Crazy" . Hallucinations later as well.   Paxil [paroxetine Hydrochloride] Other (See Comments)   Severe depression    Prednisone Other (See Comments)   "makes me feel really bad"   Propoxyphene Hcl Nausea And Vomiting   Tape Itching   Torsemide Other (See Comments)   Patient stated that she doesn't recall the reaction   Tramadol Other (See Comments)   Feels "jittery"   Vicodin [hydrocodone-acetaminophen] Other (See Comments)   Hallucinations   Meloxicam Diarrhea   Other Rash   Polyester sheets "cause me a rash"   Vancomycin Rash   Localized rash related to infusion rate      Medication List       Accurate as of October 09, 2019 11:59 PM. If you have any questions, ask your nurse or doctor.        acetaminophen 325 MG tablet Commonly known as: TYLENOL Take 2 tablets (650 mg total) by mouth every 8 (eight) hours as needed for mild pain or moderate pain.   aspirin 81 MG tablet Take 81 mg by mouth daily.   calcium citrate-vitamin D 315-200 MG-UNIT tablet Commonly known as: CITRACAL+D Take 1 tablet by mouth daily. 266m tablet daily   cetirizine 10 MG chewable tablet Commonly known as: ZYRTEC Chew 1 tablet (10 mg total) by mouth daily as needed for allergies.   cyanocobalamin 2000 MCG tablet Take 2,000 mcg by mouth daily. What changed: Another medication with the same name was removed. Continue taking this medication, and follow the directions you see here. Changed by: Harbert Fitterer X Willam Munford, NP   FIBER-CAPS PO Take 2 capsules by mouth daily.   GenTeal Tears 0.1-0.2-0.3 % Soln Apply 1 drop to eye. Both eyes   levothyroxine 137 MCG tablet Commonly known as: SYNTHROID Take 1 tablet (137 mcg total) by mouth daily before  breakfast.   loperamide 2 MG capsule Commonly known as: IMODIUM Take 1 capsule (2 mg total) by mouth daily as needed for diarrhea or loose stools.   melatonin 5 MG Tabs Take 1 tablet (5 mg total) by mouth at bedtime.   memantine 10 MG tablet Commonly known as: Namenda Take 1 tablet (10 mg total) by mouth 2 (two) times daily. What changed: when to take this   metolazone 2.5 MG tablet Commonly known as: ZAROXOLYN TAKE 1 TABLET ONCE DAILY.   nystatin-triamcinolone ointment Commonly known as: MYCOLOG Apply 1 application topically 2 (two) times daily.   omeprazole 20 MG capsule  Commonly known as: PRILOSEC TAKE 1 CAPSULE TWICE DAILY BEFORE MEALS   ondansetron 4 MG tablet Commonly known as: ZOFRAN TAKE (1) TABLET BY MOUTH EVERY SIX HOURS AS NEEDED FOR NAUSEA.   OXYGEN Inhale 2 L into the lungs continuous. KEEP O2 SATS >= 90%. Every Shift   potassium chloride 10 MEQ CR capsule Commonly known as: MICRO-K TAKE 4 CAPSULES DAILY.   QUEtiapine 25 MG tablet Commonly known as: SEROQUEL TAKE 1/2 TABLET AT BEDTIME.   saccharomyces boulardii 250 MG capsule Commonly known as: FLORASTOR Take 1 capsule (250 mg total) by mouth daily.   sertraline 100 MG tablet Commonly known as: ZOLOFT TAKE ONE TABLET AT BEDTIME.   spironolactone 25 MG tablet Commonly known as: ALDACTONE TAKE 1 TABLET ONCE DAILY.       Review of Systems:  Review of Systems  Constitutional: Negative for activity change, appetite change, fatigue and fever.  HENT: Positive for hearing loss. Negative for congestion and voice change.   Eyes: Negative for visual disturbance.  Respiratory: Positive for cough. Negative for shortness of breath.        Chronic hacking cough.   Cardiovascular: Positive for leg swelling. Negative for chest pain and palpitations.  Gastrointestinal: Positive for diarrhea and nausea. Negative for abdominal distention, abdominal pain, constipation and vomiting.       Chronic, occasional  diarrhea  Endocrine:       Get cold easily  Genitourinary: Negative for difficulty urinating, dysuria and urgency.  Musculoskeletal: Positive for gait problem.  Skin: Positive for rash.  Neurological: Positive for headaches. Negative for dizziness, seizures, syncope, facial asymmetry, speech difficulty, weakness and light-headedness.       Mild x 2 days.   Psychiatric/Behavioral: Negative for agitation, behavioral problems and sleep disturbance.    Health Maintenance  Topic Date Due  . FOOT EXAM  Never done  . OPHTHALMOLOGY EXAM  Never done  . URINE MICROALBUMIN  Never done  . DEXA SCAN  Never done  . HEMOGLOBIN A1C  12/28/2019  . INFLUENZA VACCINE  01/18/2020  . TETANUS/TDAP  11/13/2028  . COVID-19 Vaccine  Completed  . PNA vac Low Risk Adult  Completed    Physical Exam: Vitals:   10/09/19 1302  BP: 112/72  Pulse: 79  Temp: (!) 97.5 F (36.4 C)  SpO2: 96%  Weight: 263 lb 6.4 oz (119.5 kg)  Height: 5' (1.524 m)   Body mass index is 51.44 kg/m. Physical Exam Vitals and nursing note reviewed.  Constitutional:      Appearance: Normal appearance.  HENT:     Head: Normocephalic and atraumatic.     Nose: Nose normal.     Mouth/Throat:     Mouth: Mucous membranes are moist.  Eyes:     Extraocular Movements: Extraocular movements intact.     Conjunctiva/sclera: Conjunctivae normal.     Pupils: Pupils are equal, round, and reactive to light.  Cardiovascular:     Rate and Rhythm: Normal rate and regular rhythm.     Heart sounds: No murmur.  Pulmonary:     Breath sounds: Rales present.     Comments: Right basilar rales.  Abdominal:     General: Bowel sounds are normal. There is no distension.     Palpations: Abdomen is soft.     Tenderness: There is no abdominal tenderness.  Genitourinary:    Vagina: No vaginal discharge.     Rectum: Guaiac result negative.     Comments: Small external hemorrhoids 5-6pm, no injury.  Musculoskeletal:  Cervical back: Normal  range of motion and neck supple.     Right lower leg: Edema present.     Left lower leg: Edema present.     Comments: Trace edema BLE  Skin:    General: Skin is warm and dry.     Findings: Rash present.     Comments: Brownish venous insufficiency skin changes BLE. Redness in the left armpit.   Neurological:     General: No focal deficit present.     Mental Status: She is alert. Mental status is at baseline.     Motor: No weakness.     Coordination: Coordination normal.     Gait: Gait abnormal.  Psychiatric:        Mood and Affect: Mood normal.        Behavior: Behavior normal.        Thought Content: Thought content normal.        Judgment: Judgment normal.     Labs reviewed: Basic Metabolic Panel: Recent Labs    10/30/18 0000 11/06/18 1318 11/28/18 0000 12/17/18 0000 12/26/18 0000 06/30/19 1007 07/15/19 0730 09/17/19 0835  NA   < > 140 141 140  --  142  --   --   K   < > 3.9 3.6 4.0  --  3.9  --   --   CL  --  100  --   --   --  102  --   --   CO2  --  32  --   --   --  29  --   --   GLUCOSE  --  95  --   --   --  93  --   --   BUN   < > 7* 14 14  --  19  --   --   CREATININE   < > 0.99 0.9 0.9  --  0.90*  --   --   CALCIUM  --  7.9*  --   --   --  8.5*  --   --   TSH   < >  --   --   --  21.20*  --  0.15* 3.58   < > = values in this interval not displayed.   Liver Function Tests: Recent Labs    10/30/18 0000 11/06/18 1318 06/30/19 1007  AST 14 13* 13  ALT '8 10 9  ' ALKPHOS 78 69  --   BILITOT  --  0.6 0.3  PROT  --  5.7* 6.0*  ALBUMIN  --  2.7*  --    No results for input(s): LIPASE, AMYLASE in the last 8760 hours. No results for input(s): AMMONIA in the last 8760 hours. CBC: Recent Labs    10/30/18 0000 11/06/18 1318 07/15/19 0730  WBC 7.3 6.6 6.9  NEUTROABS  --  4.7 4,816  HGB 12.6 11.7* 13.3  HCT 39 37.7 40.9  MCV  --  92.2 87.4  PLT 132* 140* 158   Lipid Panel: Recent Labs    06/30/19 1007  CHOL 179  HDL 48*  LDLCALC 110*  TRIG 105   CHOLHDL 3.7   Lab Results  Component Value Date   HGBA1C 6.1 (H) 06/30/2019    Procedures since last visit: No results found.  Assessment/Plan Rectal bleed Smear blood on toilet paper day before yesterday only,  no rectal or abd pain, no dysuria,  no constipation, FOBT negative upon my examination. C/o nauseated yesterday, resolved after x1  Zofran, feels fine today. HA since yesterday, no focal neurological or upper respiratory symptoms. Hx of GERD, hemorrhoids-no injury upon my examination. Will check CBC/diff, CMP/eGFR if symptoms persist.   Hypothyroidism (acquired) Stable, TSH 3.58 09/18/19, continue Levothyroxine.   Hemorrhoids No active bleeding or injury.   Essential hypertension Blood pressure is controlled, continue ASA 55m qd.   Acid reflux Stable, continue prn Zofran, Omeprazole.   Lymphedema Chronic, better controlled, continue Spironolactone, Metolazone.     Labs/tests ordered: CBC/diff, CMP/eGFR next week if needed, otherwise  may schedule CBC/diff, CMP/eGFR, Vit B12 prior to the next appointment.   Next appt:  12/23/2019

## 2019-10-09 NOTE — Assessment & Plan Note (Signed)
Stable, continue prn Zofran, Omeprazole.

## 2019-10-09 NOTE — Assessment & Plan Note (Signed)
Stable, TSH 3.58 09/18/19, continue Levothyroxine.

## 2019-10-09 NOTE — Patient Instructions (Signed)
CBC/diff, CMP/eGFR, TSH next week if needed for recurs of rectal bleed, nausea, headaches,  otherwise  may schedule CBC/diff, CMP/eGFR, TSH with Vit B12 prior to the next appointment. Next appt:  12/23/2019

## 2019-10-09 NOTE — Assessment & Plan Note (Signed)
Blood pressure is controlled, continue ASA $RemoveBefo'81mg'[id number]$  qd.

## 2019-10-09 NOTE — Assessment & Plan Note (Signed)
Smear blood on toilet paper day before yesterday only,  no rectal or abd pain, no dysuria,  no constipation, FOBT negative upon my examination. C/o nauseated yesterday, resolved after x1 Zofran, feels fine today. HA since yesterday, no focal neurological or upper respiratory symptoms. Hx of GERD, hemorrhoids-no injury upon my examination. Will check CBC/diff, CMP/eGFR if symptoms persist.

## 2019-10-09 NOTE — Assessment & Plan Note (Signed)
Chronic, better controlled, continue Spironolactone, Metolazone.

## 2019-10-10 ENCOUNTER — Encounter: Payer: Self-pay | Admitting: Nurse Practitioner

## 2019-10-14 ENCOUNTER — Other Ambulatory Visit: Payer: Self-pay

## 2019-10-14 DIAGNOSIS — K625 Hemorrhage of anus and rectum: Secondary | ICD-10-CM

## 2019-10-14 DIAGNOSIS — E538 Deficiency of other specified B group vitamins: Secondary | ICD-10-CM | POA: Diagnosis not present

## 2019-10-15 ENCOUNTER — Other Ambulatory Visit: Payer: Self-pay | Admitting: Nurse Practitioner

## 2019-10-15 LAB — CBC WITH DIFFERENTIAL/PLATELET
Absolute Monocytes: 469 cells/uL (ref 200–950)
Basophils Absolute: 82 cells/uL (ref 0–200)
Basophils Relative: 1.2 %
Eosinophils Absolute: 286 cells/uL (ref 15–500)
Eosinophils Relative: 4.2 %
HCT: 41.3 % (ref 35.0–45.0)
Hemoglobin: 13.6 g/dL (ref 11.7–15.5)
Lymphs Abs: 1224 cells/uL (ref 850–3900)
MCH: 28.9 pg (ref 27.0–33.0)
MCHC: 32.9 g/dL (ref 32.0–36.0)
MCV: 87.7 fL (ref 80.0–100.0)
MPV: 13.8 fL — ABNORMAL HIGH (ref 7.5–12.5)
Monocytes Relative: 6.9 %
Neutro Abs: 4740 cells/uL (ref 1500–7800)
Neutrophils Relative %: 69.7 %
Platelets: 131 10*3/uL — ABNORMAL LOW (ref 140–400)
RBC: 4.71 10*6/uL (ref 3.80–5.10)
RDW: 16 % — ABNORMAL HIGH (ref 11.0–15.0)
Total Lymphocyte: 18 %
WBC: 6.8 10*3/uL (ref 3.8–10.8)

## 2019-10-15 LAB — COMPLETE METABOLIC PANEL WITH GFR
AG Ratio: 1.5 (calc) (ref 1.0–2.5)
ALT: 9 U/L (ref 6–29)
AST: 12 U/L (ref 10–35)
Albumin: 3.7 g/dL (ref 3.6–5.1)
Alkaline phosphatase (APISO): 90 U/L (ref 37–153)
BUN/Creatinine Ratio: 23 (calc) — ABNORMAL HIGH (ref 6–22)
BUN: 21 mg/dL (ref 7–25)
CO2: 28 mmol/L (ref 20–32)
Calcium: 8.6 mg/dL (ref 8.6–10.4)
Chloride: 103 mmol/L (ref 98–110)
Creat: 0.9 mg/dL — ABNORMAL HIGH (ref 0.60–0.88)
GFR, Est African American: 67 mL/min/{1.73_m2} (ref >=60)
GFR, Est Non African American: 57 mL/min/{1.73_m2} — ABNORMAL LOW (ref >=60)
Globulin: 2.5 g/dL (calc) (ref 1.9–3.7)
Glucose, Bld: 93 mg/dL (ref 65–99)
Potassium: 4 mmol/L (ref 3.5–5.3)
Sodium: 141 mmol/L (ref 135–146)
Total Bilirubin: 0.5 mg/dL (ref 0.2–1.2)
Total Protein: 6.2 g/dL (ref 6.1–8.1)

## 2019-10-15 LAB — VITAMIN B12: Vitamin B-12: 1156 pg/mL — ABNORMAL HIGH (ref 200–1100)

## 2019-10-21 ENCOUNTER — Telehealth: Payer: Self-pay | Admitting: *Deleted

## 2019-10-21 NOTE — Telephone Encounter (Signed)
Patient notified and agreed.  

## 2019-10-21 NOTE — Telephone Encounter (Signed)
I would not recommend it as her Potassium was 4 in Blood work. I would say to take 2 Potassium caps in the morning and 2 in the evening and see if it helps. Also Try Stretching her Legs before Sleeping and that will help also. If Nothing Helps we can try Neurontin PRN for Cramps

## 2019-10-21 NOTE — Telephone Encounter (Signed)
Patient called and stated that she has started having leg cramps at night time that is waking her up.  Patient stated that she is taking Potassium 47meq Four capsules in the morning.  Patient is wondering if she can take an extra capsule in the evening to help prevent cramps at bedtime. Please Advise.

## 2019-10-28 DIAGNOSIS — L820 Inflamed seborrheic keratosis: Secondary | ICD-10-CM | POA: Diagnosis not present

## 2019-10-28 DIAGNOSIS — L821 Other seborrheic keratosis: Secondary | ICD-10-CM | POA: Diagnosis not present

## 2019-11-17 ENCOUNTER — Other Ambulatory Visit: Payer: Self-pay | Admitting: Nurse Practitioner

## 2019-11-18 NOTE — Telephone Encounter (Signed)
[place] requested by pharmacy Pended Rx's and sent to Dr. Lyndel Safe for approval due to [place].

## 2019-11-28 ENCOUNTER — Encounter: Payer: Self-pay | Admitting: Internal Medicine

## 2019-11-28 ENCOUNTER — Non-Acute Institutional Stay: Payer: Medicare Other | Admitting: Internal Medicine

## 2019-11-28 ENCOUNTER — Other Ambulatory Visit: Payer: Self-pay

## 2019-11-28 VITALS — BP 122/72 | HR 98 | Temp 97.3°F | Ht 60.0 in | Wt 267.2 lb

## 2019-11-28 DIAGNOSIS — I5032 Chronic diastolic (congestive) heart failure: Secondary | ICD-10-CM

## 2019-11-28 DIAGNOSIS — I10 Essential (primary) hypertension: Secondary | ICD-10-CM

## 2019-11-28 DIAGNOSIS — R4189 Other symptoms and signs involving cognitive functions and awareness: Secondary | ICD-10-CM

## 2019-11-28 DIAGNOSIS — I890 Lymphedema, not elsewhere classified: Secondary | ICD-10-CM | POA: Diagnosis not present

## 2019-11-28 DIAGNOSIS — E1169 Type 2 diabetes mellitus with other specified complication: Secondary | ICD-10-CM

## 2019-11-28 DIAGNOSIS — R238 Other skin changes: Secondary | ICD-10-CM

## 2019-11-28 DIAGNOSIS — E669 Obesity, unspecified: Secondary | ICD-10-CM | POA: Diagnosis not present

## 2019-11-28 MED ORDER — METOLAZONE 2.5 MG PO TABS: 2.5000 mg | ORAL_TABLET | Freq: Every day | ORAL | 3 refills | Status: DC

## 2019-11-28 NOTE — Patient Instructions (Signed)
Increase Metolazone to 5 mg on Friday and Monday.

## 2019-11-28 NOTE — Progress Notes (Signed)
Location: [facility] of Service:  Clinic (12)  Provider:   Code Status:  Goals of Care:  Advanced Directives 01/29/2019  Does Patient Have a Medical Advance Directive? Yes  Type of Paramedic of [place];Living will  Does patient want to make changes to medical advance directive? No - Patient declined  Copy of [place] in Chart? Yes - validated most recent copy scanned in chart (See row information)  Would patient like information on creating a medical advance directive? -  Pre-existing out of facility DNR order (yellow form or pink MOST form) -     Chief Complaint  Patient presents with  . Acute Visit    Ulcer on the back of right leg.    HPI: Patient is a 86 y.o. female seen today for an acute visit for wound in the back of her Leg  Patient has h/o Hypertension, Chronic LE edema with Diastolic CHF and Lymphedema, Anxiety, B12 def,OSA but does not use CPAP only Oxygen , Diabetes Type 2, Diarrhea chronic,  ? Wound Patient states that she woke up this morning and felt something in the back of her leg. It is nontender she did not feel any discharge. She wanted to make sure there was not a open wound or ulcer Lower extremity edema Patient has a history of lymphedema. But she has noticed more swelling recently and has gained some weight. She denies any shortness of breath. Is on Aldactone and metolazone Hallucinations They have been doing really well on Seroquel. She said that she tried to stop Seroquel but then she can sleep Chronic diarrhea Is doing well has to take Imodium every few days Memory issues She wanted to know if she needs to take something more stronger than Namenda. Her memory and MMSE in the past has been 30 out of 30. MRI showed generalized atrophy.  Past Medical History:  Diagnosis Date  . Altered mental status   . ARTHRITIS, KNEES, BILATERAL   . Breast CA ([place]) 2000   s/p R lumpectomy and  XRT  . CHF (congestive heart failure) ([place])   . DEPRESSION   . DJD (degenerative joint disease) of knee   . GERD   . HYPERLIPIDEMIA   . HYPERTENSION   . HYPOTHYROIDISM    postsurgical  . Hypoxia   . MIGRAINE HEADACHE   . Morbid obesity ([place])   . OSA (obstructive sleep apnea) 01/17/2011 dx  . Oxygen dependent   . Urge incontinence   . UTI (lower urinary tract infection) 12/2014    Past Surgical History:  Procedure Laterality Date  . ABDOMINAL HYSTERECTOMY    . ADENOIDECTOMY    . BREAST BIOPSY  2000  . BREAST LUMPECTOMY  2000  . CATARACT EXTRACTION    . CHOLECYSTECTOMY  1995  . CYSTOSCOPY/URETEROSCOPY/HOLMIUM LASER Right 06/28/2015   Procedure: CYSTOSCOPY RIGHT RETROGRAD RIGHT URETEROSCOPY/HOLMIUM LASER WITH RIGHT STENT PLACEMENT;  Surgeon: Alexis Frock, MD;  Location: [facility];  Service: Urology;  Laterality: Right;  . PARATHYROIDECTOMY     2-3 removed  . REPLACEMENT TOTAL KNEE BILATERAL  1999, 2005  . Right leg femur repaired    . THYROIDECTOMY    . TONSILLECTOMY    . TUBAL LIGATION      Allergies  Allergen Reactions  . Azithromycin Itching  . Beta Adrenergic Blockers Other (See Comments)    Depression   . Ciprofloxacin Other (See Comments)    Edgy and very jumpy    .  Codeine Nausea And Vomiting  . Darvon [Propoxyphene] Nausea And Vomiting  . Epinephrine Other (See Comments)    Rapid pulse, sweats  . Klonopin [Clonazepam] Other (See Comments)    Makes her feel very suicidal   . Oxycodone Other (See Comments)    Patient felt like it altered her mental status "Crazy" . Hallucinations later as well.  [name] Paxil [Paroxetine Hydrochloride] Other (See Comments)    Severe depression   . Prednisone Other (See Comments)    "makes me feel really bad"  . Propoxyphene Hcl Nausea And Vomiting  . Tape Itching  . Torsemide Other (See Comments)    Patient stated that she doesn't recall the reaction  . Tramadol Other (See Comments)    Feels "jittery"  . Vicodin  [Hydrocodone-Acetaminophen] Other (See Comments)    Hallucinations  . Meloxicam Diarrhea  . Other Rash    Polyester sheets "cause me a rash"  . Vancomycin Rash    Localized rash related to infusion rate    Outpatient Encounter Medications as of 11/28/2019  Medication Sig  . acetaminophen (TYLENOL) 325 MG tablet Take 2 tablets (650 mg total) by mouth every 8 (eight) hours as needed for mild pain or moderate pain.  . Artificial Tear Solution (GENTEAL TEARS) 0.1-0.2-0.3 % SOLN Apply 1 drop to eye. Both eyes  . aspirin 81 MG tablet Take 81 mg by mouth daily.   . calcium citrate-vitamin D (CITRACAL+D) 315-200 MG-UNIT tablet Take 1 tablet by mouth daily. $RemoveBefo'200mg'hnLjpCiUnLl$  tablet daily  . Calcium Polycarbophil (FIBER-CAPS PO) Take 2 capsules by mouth daily.  . cetirizine (ZYRTEC) 10 MG chewable tablet Chew 1 tablet (10 mg total) by mouth daily as needed for allergies.  . cyanocobalamin 2000 MCG tablet Take 2,000 mcg by mouth daily.  [name] loperamide (IMODIUM) 2 MG capsule Take 1 capsule (2 mg total) by mouth daily as needed for diarrhea or loose stools.  . Melatonin 5 MG TABS Take 1 tablet (5 mg total) by mouth at bedtime.  . memantine (NAMENDA) 10 MG tablet Take 1 tablet (10 mg total) by mouth 2 (two) times daily. (Patient taking differently: Take 10 mg by mouth daily. )  . metolazone (ZAROXOLYN) 2.5 MG tablet TAKE 1 TABLET ONCE DAILY.  [name] omeprazole (PRILOSEC) 20 MG capsule TAKE 1 CAPSULE TWICE DAILY BEFORE MEALS  . ondansetron (ZOFRAN) 4 MG tablet TAKE (1) TABLET BY MOUTH EVERY SIX HOURS AS NEEDED FOR NAUSEA.  [name] OXYGEN Inhale 2 L into the lungs continuous. KEEP O2 SATS >= 90%. Every Shift  . potassium chloride (MICRO-K) 10 MEQ CR capsule TAKE 4 CAPSULES DAILY.  [name] QUEtiapine (SEROQUEL) 25 MG tablet TAKE 1/2 TABLET AT BEDTIME.  [name] saccharomyces boulardii (FLORASTOR) 250 MG capsule Take 1 capsule (250 mg total) by mouth daily.  . sertraline (ZOLOFT) 100 MG tablet TAKE ONE TABLET AT BEDTIME.  [name] spironolactone  (ALDACTONE) 25 MG tablet TAKE 1 TABLET ONCE DAILY.  . SYNTHROID 137 MCG tablet TAKE 1 TABLET ONCE DAILY BEFORE BREAKFAST.   No facility-administered encounter medications on file as of 11/28/2019.    Review of Systems:  Review of Systems  Constitutional: Negative.   HENT: Negative.   Respiratory: Negative.   Cardiovascular: Positive for leg swelling.  Gastrointestinal: Positive for diarrhea.  Genitourinary: Negative.   Musculoskeletal: Positive for gait problem.  Skin: Positive for wound.  Neurological: Positive for weakness.  Psychiatric/Behavioral: Positive for sleep disturbance.    Health Maintenance  Topic Date Due  . FOOT EXAM  Never done  .  OPHTHALMOLOGY EXAM  Never done  . URINE MICROALBUMIN  Never done  . DEXA SCAN  Never done  . HEMOGLOBIN A1C  12/28/2019  . INFLUENZA VACCINE  01/18/2020  . TETANUS/TDAP  11/13/2028  . COVID-19 Vaccine  Completed  . PNA vac Low Risk Adult  Completed    Physical Exam: Vitals:   11/28/19 1121  BP: 122/72  Pulse: 98  Temp: (!) 97.3 F (36.3 C)  SpO2: 92%  Weight: 267 lb 3.2 oz (121.2 kg)  Height: 5' (1.524 m)   Body mass index is 52.18 kg/m. Physical Exam Vitals reviewed.  Constitutional:      Appearance: She is obese.  HENT:     Head: Normocephalic.     Nose: Nose normal.     Mouth/Throat:     Mouth: Mucous membranes are moist.     Pharynx: Oropharynx is clear.  Eyes:     Pupils: Pupils are equal, round, and reactive to light.  Cardiovascular:     Rate and Rhythm: Normal rate and regular rhythm.     Pulses: Normal pulses.  Pulmonary:     Effort: Pulmonary effort is normal.     Breath sounds: Normal breath sounds.  Abdominal:     General: Abdomen is flat. Bowel sounds are normal.     Palpations: Abdomen is soft.  Musculoskeletal:     Cervical back: Neck supple.     Comments: Increased swelling in her LE with Chronic Changes  Skin:    Comments: Has small Papule in the back of Right leg. No Pus or Discharge or  redness   Neurological:     General: No focal deficit present.     Mental Status: She is alert and oriented to person, place, and time.  Psychiatric:        Mood and Affect: Mood normal.        Thought Content: Thought content normal.     Labs reviewed: Basic Metabolic Panel: Recent Labs    12/17/18 0000 12/26/18 0000 06/30/19 1007 07/15/19 0730 09/17/19 0835 10/14/19 0815  NA 140  --  142  --   --  141  K 4.0  --  3.9  --   --  4.0  CL  --   --  102  --   --  103  CO2  --   --  29  --   --  28  GLUCOSE  --   --  93  --   --  93  BUN 14  --  19  --   --  21  CREATININE 0.9  --  0.90*  --   --  0.90*  CALCIUM  --   --  8.5*  --   --  8.6  TSH  --  21.20*  --  0.15* 3.58  --    Liver Function Tests: Recent Labs    06/30/19 1007 10/14/19 0815  AST 13 12  ALT 9 9  BILITOT 0.3 0.5  PROT 6.0* 6.2   No results for input(s): LIPASE, AMYLASE in the last 8760 hours. No results for input(s): AMMONIA in the last 8760 hours. CBC: Recent Labs    07/15/19 0730 10/14/19 0815  WBC 6.9 6.8  NEUTROABS 4,816 4,740  HGB 13.3 13.6  HCT 40.9 41.3  MCV 87.4 87.7  PLT 158 131*   Lipid Panel: Recent Labs    06/30/19 1007  CHOL 179  HDL 48*  LDLCALC 110*  TRIG 105  CHOLHDL 3.7  Lab Results  Component Value Date   HGBA1C 6.1 (H) 06/30/2019    Procedures since last visit: No results found.  Assessment/Plan  Papule No Infection or redness It is hard for patient to monitor due to her Obesity Will have it seen again in 4 weeks She will let us know if she feels pain or discharge  Lymphedema Also has h/o Diastolic CHF Will increase her Metolazone to 5 mg 2/week o help her swelling Will repeat Labs in 4 weeks to follow Bun/Creat and Potassium   Diabetes mellitus type 2 in obese [facility]) Will recheck her A1C  Cognitive impairment Her memory and MMSE in the past has been 30 out of 30. MRI showed generalized atrophy. Continue Namenda for now Insomnia with  Hallucinations Failed GDR for Seroquel Continue Low dose  Diarrhea, unspecified type -  Her Work up has been negative Her symptoms are much improved. Just takes Imodium PRN Thrombocytopenia We will repeat CBC Follow-up in 4 weeks for her BMP and increased dose of metolazone  Labs/tests ordered:  * No order type specified * Next appt:  01/01/2020

## 2019-12-08 ENCOUNTER — Other Ambulatory Visit: Payer: Self-pay | Admitting: Internal Medicine

## 2019-12-15 ENCOUNTER — Emergency Department (HOSPITAL_COMMUNITY): Payer: Medicare Other

## 2019-12-15 ENCOUNTER — Other Ambulatory Visit: Payer: Self-pay

## 2019-12-15 ENCOUNTER — Encounter (HOSPITAL_COMMUNITY): Payer: Self-pay | Admitting: Emergency Medicine

## 2019-12-15 ENCOUNTER — Emergency Department (HOSPITAL_COMMUNITY)
Admission: EM | Admit: 2019-12-15 | Discharge: 2019-12-16 | Disposition: A | Discharge status: Home / Self Care | Payer: Medicare Other | Attending: Emergency Medicine | Admitting: Emergency Medicine

## 2019-12-15 DIAGNOSIS — Z7982 Long term (current) use of aspirin: Secondary | ICD-10-CM | POA: Diagnosis not present

## 2019-12-15 DIAGNOSIS — N183 Chronic kidney disease, stage 3 unspecified: Secondary | ICD-10-CM | POA: Diagnosis not present

## 2019-12-15 DIAGNOSIS — E039 Hypothyroidism, unspecified: Secondary | ICD-10-CM | POA: Insufficient documentation

## 2019-12-15 DIAGNOSIS — Z96653 Presence of artificial knee joint, bilateral: Secondary | ICD-10-CM | POA: Diagnosis not present

## 2019-12-15 DIAGNOSIS — I5032 Chronic diastolic (congestive) heart failure: Secondary | ICD-10-CM | POA: Insufficient documentation

## 2019-12-15 DIAGNOSIS — Z87891 Personal history of nicotine dependence: Secondary | ICD-10-CM | POA: Insufficient documentation

## 2019-12-15 DIAGNOSIS — R0602 Shortness of breath: Secondary | ICD-10-CM | POA: Diagnosis not present

## 2019-12-15 DIAGNOSIS — R0789 Other chest pain: Secondary | ICD-10-CM | POA: Diagnosis not present

## 2019-12-15 DIAGNOSIS — Z853 Personal history of malignant neoplasm of breast: Secondary | ICD-10-CM | POA: Insufficient documentation

## 2019-12-15 DIAGNOSIS — I130 Hypertensive heart and chronic kidney disease with heart failure and stage 1 through stage 4 chronic kidney disease, or unspecified chronic kidney disease: Secondary | ICD-10-CM | POA: Diagnosis not present

## 2019-12-15 DIAGNOSIS — E1122 Type 2 diabetes mellitus with diabetic chronic kidney disease: Secondary | ICD-10-CM | POA: Diagnosis not present

## 2019-12-15 DIAGNOSIS — Z960 Presence of urogenital implants: Secondary | ICD-10-CM | POA: Insufficient documentation

## 2019-12-15 DIAGNOSIS — Z79899 Other long term (current) drug therapy: Secondary | ICD-10-CM | POA: Diagnosis not present

## 2019-12-15 DIAGNOSIS — R079 Chest pain, unspecified: Secondary | ICD-10-CM | POA: Diagnosis not present

## 2019-12-15 LAB — BASIC METABOLIC PANEL
Anion gap: 10 (ref 5–15)
BUN: 19 mg/dL (ref 8–23)
CO2: 28 mmol/L (ref 22–32)
Calcium: 8.7 mg/dL — ABNORMAL LOW (ref 8.9–10.3)
Chloride: 100 mmol/L (ref 98–111)
Creatinine, Ser: 1.03 mg/dL — ABNORMAL HIGH (ref 0.44–1.00)
GFR calc Af Amer: 57 mL/min — ABNORMAL LOW (ref >60)
GFR calc non Af Amer: 49 mL/min — ABNORMAL LOW (ref >60)
Glucose, Bld: 111 mg/dL — ABNORMAL HIGH (ref 70–99)
Potassium: 3.8 mmol/L (ref 3.5–5.1)
Sodium: 138 mmol/L (ref 135–145)

## 2019-12-15 LAB — CBC
HCT: 43.1 % (ref 36.0–46.0)
Hemoglobin: 13.6 g/dL (ref 12.0–15.0)
MCH: 28.2 pg (ref 26.0–34.0)
MCHC: 31.6 g/dL (ref 30.0–36.0)
MCV: 89.2 fL (ref 80.0–100.0)
Platelets: 131 10*3/uL — ABNORMAL LOW (ref 150–400)
RBC: 4.83 MIL/uL (ref 3.87–5.11)
RDW: 16.6 % — ABNORMAL HIGH (ref 11.5–15.5)
WBC: 7.3 10*3/uL (ref 4.0–10.5)
nRBC: 0 % (ref 0.0–0.2)

## 2019-12-15 LAB — TROPONIN I (HIGH SENSITIVITY): Troponin I (High Sensitivity): 5 ng/L (ref <18)

## 2019-12-15 MED ORDER — SODIUM CHLORIDE 0.9% FLUSH: 3.0000 mL | Freq: Once | INTRAVENOUS | Status: DC

## 2019-12-15 NOTE — ED Triage Notes (Signed)
Per EMS, pt from [facility] facility.  She c/o Chest pain, nausea, weakness and SOB X2 days.  Pt is on 2L O2 at night however has needed it during the day as well.  Is in a Motorized wheel chair and can turn and pivot but not able to ambulate on her own.  Pt had difficulty given EMS a heath hx.    Pt was able to tell RN that she has had chest tightest.

## 2019-12-16 DIAGNOSIS — R0602 Shortness of breath: Secondary | ICD-10-CM | POA: Diagnosis not present

## 2019-12-16 LAB — BRAIN NATRIURETIC PEPTIDE: B Natriuretic Peptide: 51 pg/mL (ref 0.0–100.0)

## 2019-12-16 LAB — TROPONIN I (HIGH SENSITIVITY): Troponin I (High Sensitivity): 6 ng/L (ref <18)

## 2019-12-16 NOTE — ED Provider Notes (Signed)
[facility] EMERGENCY DEPARTMENT Provider Note   CSN: [id number] Arrival date & time: 12/15/19  2211     History Chief Complaint  Patient presents with  . Chest Pain  . Shortness of Breath    Tamara Morales is a 86 y.o. female.  Patient presents for evaluation of shortness of breath.  Patient reports that she has had increased shortness of breath over the last 2 days.  She does report that she had some "fleeting" left-sided chest pain earlier today but none currently.  She also reports that the shortness of breath is no longer present, since she got here to the emergency department.  She reports that she uses 2 L of nasal cannula oxygen at night only.  She was placed on oxygen at arrival to the ER with improvement.        Past Medical History:  Diagnosis Date  . Altered mental status   . ARTHRITIS, KNEES, BILATERAL   . Breast CA ([place]) 2000   s/p R lumpectomy and XRT  . CHF (congestive heart failure) ([place])   . DEPRESSION   . DJD (degenerative joint disease) of knee   . GERD   . HYPERLIPIDEMIA   . HYPERTENSION   . HYPOTHYROIDISM    postsurgical  . Hypoxia   . MIGRAINE HEADACHE   . Morbid obesity ([place])   . OSA (obstructive sleep apnea) 01/17/2011 dx  . Oxygen dependent   . Urge incontinence   . UTI (lower urinary tract infection) 12/2014    Patient Active Problem List   Diagnosis Date Noted  . Rectal bleed 10/09/2019  . Cognitive impairment 10/02/2019  . Rash in adult 10/02/2019  . Visual hallucinations 12/31/2018  . Weight loss 12/10/2018  . Allergic rhinitis 12/06/2018  . Left ankle pain 12/06/2018  . Hypocalcemia 12/02/2018  . Fall 11/15/2018  . Gait abnormality 11/15/2018  . Hallucination, visual 11/14/2018  . Confusion 11/10/2018  . Generalized weakness 11/06/2018  . Chronic diastolic CHF (congestive heart failure) ([place]) 10/29/2018  . Vitamin B12 deficiency 05/23/2018  . Anxiety 04/23/2018  . Chronic respiratory failure with  hypoxia ([place]) 04/23/2018  . Acute on chronic diastolic (congestive) heart failure ([place]) 03/14/2018  . Diabetes mellitus type 2 in obese ([place]) 03/12/2018  . Lymphedema 11/20/2017  . Counseling regarding advanced care planning and goals of care 09/20/2017  . Hypomagnesemia 07/17/2017  . Prediabetes 07/03/2017  . Hyperlipidemia LDL goal <70 07/03/2017  . Post-nasal drip 06/27/2017  . CKD (chronic kidney disease) stage 3, GFR 30-59 ml/min 05/22/2017  . Bilateral dry eyes 04/20/2017  . Hemorrhoids 04/09/2017  . Chronic constipation 03/30/2017  . Weight gain 12/07/2016  . Neck pain on left side 10/05/2016  . Chronic left shoulder pain 08/10/2016  . Numbness and tingling of right leg 04/06/2016  . IBS (irritable bowel syndrome) 11/18/2015  . Hypokalemia 07/07/2015  . Acid reflux 07/05/2015  . UTI (urinary tract infection) 07/02/2015  . Ureterolithiasis   . Depression with anxiety 06/27/2015  . History of breast cancer 06/27/2015  . Obesity hypoventilation syndrome ([place]) 11/27/2014  . Diarrhea 08/21/2013  . Dyspnea 03/13/2013  . Cough 03/05/2010  . Morbid obesity ([place]) 10/04/2009  . Hypothyroidism (acquired) 08/05/2009  . Essential hypertension 08/05/2009    Past Surgical History:  Procedure Laterality Date  . ABDOMINAL HYSTERECTOMY    . ADENOIDECTOMY    . BREAST BIOPSY  2000  . BREAST LUMPECTOMY  2000  . CATARACT EXTRACTION    . CHOLECYSTECTOMY  1995  .  CYSTOSCOPY/URETEROSCOPY/HOLMIUM LASER Right 06/28/2015   Procedure: CYSTOSCOPY RIGHT RETROGRAD RIGHT URETEROSCOPY/HOLMIUM LASER WITH RIGHT STENT PLACEMENT;  Surgeon: Alexis Frock, MD;  Location: [facility];  Service: Urology;  Laterality: Right;  . PARATHYROIDECTOMY     2-3 removed  . REPLACEMENT TOTAL KNEE BILATERAL  1999, 2005  . Right leg femur repaired    . THYROIDECTOMY    . TONSILLECTOMY    . TUBAL LIGATION       OB History    Gravida  5   Para  5   Term      Preterm      AB      Living        SAB       TAB      Ectopic      Multiple      Live Births              Family History  Problem Relation Age of Onset  . Heart disease Mother   . Heart disease Father   . Emphysema Sister   . Coronary artery disease Neg Hx     Social History   Tobacco Use  . Smoking status: Former Smoker    Packs/day: 0.25    Years: 2.00    Pack years: 0.50    Types: Cigarettes    Quit date: 06/19/1974    Years since quitting: 45.5  . Smokeless tobacco: Never Used  . Tobacco comment: Quit in late 20's  Vaping Use  . Vaping Use: Never used  Substance Use Topics  . Alcohol use: No    Alcohol/week: 0.0 standard drinks  . Drug use: No    Home Medications Prior to Admission medications   Medication Sig Start Date End Date Taking? Authorizing Provider  acetaminophen (TYLENOL) 325 MG tablet Take 2 tablets (650 mg total) by mouth every 8 (eight) hours as needed for mild pain or moderate pain. 02/11/19   Virgie Dad, MD  Artificial Tear Solution (GENTEAL TEARS) 0.1-0.2-0.3 % SOLN Apply 1 drop to eye. Both eyes    [provider]  aspirin 81 MG tablet Take 81 mg by mouth daily.     [provider]  calcium citrate-vitamin D (CITRACAL+D) 315-200 MG-UNIT tablet Take 1 tablet by mouth daily. $RemoveBefo'200mg'NcgVPeXynfr$  tablet daily 02/11/19   Virgie Dad, MD  Calcium Polycarbophil (FIBER-CAPS PO) Take 2 capsules by mouth daily.    [provider]  cetirizine (ZYRTEC) 10 MG chewable tablet Chew 1 tablet (10 mg total) by mouth daily as needed for allergies. 02/11/19   Virgie Dad, MD  cyanocobalamin 2000 MCG tablet Take 2,000 mcg by mouth daily.    [provider]  loperamide (IMODIUM) 2 MG capsule Take 1 capsule (2 mg total) by mouth daily as needed for diarrhea or loose stools. 02/11/19   Virgie Dad, MD  Melatonin 5 MG TABS Take 1 tablet (5 mg total) by mouth at bedtime. 02/11/19   Virgie Dad, MD  memantine (NAMENDA) 10 MG tablet Take 1 tablet (10 mg total) by mouth 2 (two)  times daily. Patient taking differently: Take 10 mg by mouth daily.  03/11/19   Marcial Pacas, MD  metolazone (ZAROXOLYN) 2.5 MG tablet Take 1 tablet (2.5 mg total) by mouth daily. Take 2.5 mg One tablet 5 days a week Take 5 mg two tablets 2 days a week Mon and Fri 11/28/19   Virgie Dad, MD  omeprazole (PRILOSEC) 20 MG capsule TAKE 1  CAPSULE TWICE DAILY BEFORE MEALS Patient taking differently: Take 20 mg by mouth 2 (two) times daily before a meal.  11/18/19   Virgie Dad, MD  ondansetron (ZOFRAN) 4 MG tablet TAKE (1) TABLET BY MOUTH EVERY SIX HOURS AS NEEDED FOR NAUSEA. Patient taking differently: Take 4 mg by mouth every 6 (six) hours as needed for nausea or vomiting.  02/11/19   Virgie Dad, MD  OXYGEN Inhale 2 L into the lungs continuous. KEEP O2 SATS >= 90%. Every Shift    [provider]  potassium chloride (MICRO-K) 10 MEQ CR capsule TAKE 4 CAPSULES DAILY. Patient taking differently: Take 40 mEq by mouth daily.  11/18/19   Virgie Dad, MD  QUEtiapine (SEROQUEL) 25 MG tablet TAKE 1/2 TABLET AT BEDTIME. Patient taking differently: Take 12.5 mg by mouth at bedtime.  11/18/19   Virgie Dad, MD  saccharomyces boulardii (FLORASTOR) 250 MG capsule Take 1 capsule (250 mg total) by mouth daily. 02/11/19   Virgie Dad, MD  sertraline (ZOLOFT) 100 MG tablet TAKE ONE TABLET AT BEDTIME. Patient taking differently: Take 100 mg by mouth at bedtime.  08/20/19   Virgie Dad, MD  spironolactone (ALDACTONE) 25 MG tablet TAKE 1 TABLET ONCE DAILY. Patient taking differently: Take 25 mg by mouth daily.  11/18/19   Virgie Dad, MD  SYNTHROID 137 MCG tablet TAKE 1 TABLET ONCE DAILY BEFORE BREAKFAST. Patient taking differently: Take 137 mcg by mouth daily before breakfast.  12/08/19   Virgie Dad, MD    Allergies    Azithromycin, Beta adrenergic blockers, Ciprofloxacin, Codeine, Darvon [propoxyphene], Epinephrine, Klonopin [clonazepam], Oxycodone, Paxil [paroxetine hydrochloride],  Prednisone, Propoxyphene hcl, Tape, Torsemide, Tramadol, Vicodin [hydrocodone-acetaminophen], Meloxicam, Other, and Vancomycin  Review of Systems   Review of Systems  Respiratory: Positive for chest tightness and shortness of breath.   All other systems reviewed and are negative.   Physical Exam Updated Vital Signs BP 132/82 (BP Location: Left Arm)   Pulse 91   Temp 97.8 F (36.6 C) (Oral)   Resp 19   Ht $R'5\' 5"'lk$  (1.651 m)   Wt 121.2 kg   SpO2 96%   BMI 44.46 kg/m   Physical Exam Vitals and nursing note reviewed.  Constitutional:      General: She is not in acute distress.    Appearance: Normal appearance. She is well-developed.  HENT:     Head: Normocephalic and atraumatic.     Right Ear: Hearing normal.     Left Ear: Hearing normal.     Nose: Nose normal.  Eyes:     Conjunctiva/sclera: Conjunctivae normal.     Pupils: Pupils are equal, round, and reactive to light.  Cardiovascular:     Rate and Rhythm: Regular rhythm.     Heart sounds: S1 normal and S2 normal. No murmur heard.  No friction rub. No gallop.   Pulmonary:     Effort: Pulmonary effort is normal. No respiratory distress.     Breath sounds: Normal breath sounds.  Chest:     Chest wall: No tenderness.  Abdominal:     General: Bowel sounds are normal.     Palpations: Abdomen is soft.     Tenderness: There is no abdominal tenderness. There is no guarding or rebound. Negative signs include Murphy's sign and McBurney's sign.     Hernia: No hernia is present.  Musculoskeletal:        General: Normal range of motion.     Cervical back:  Normal range of motion and neck supple.  Skin:    General: Skin is warm and dry.     Findings: No rash.  Neurological:     Mental Status: She is alert and oriented to person, place, and time.     GCS: GCS eye subscore is 4. GCS verbal subscore is 5. GCS motor subscore is 6.     Cranial Nerves: No cranial nerve deficit.     Sensory: No sensory deficit.     Coordination:  Coordination normal.  Psychiatric:        Speech: Speech normal.        Behavior: Behavior normal.        Thought Content: Thought content normal.     ED Results / Procedures / Treatments   Labs (all labs ordered are listed, but only abnormal results are displayed) Labs Reviewed  BASIC METABOLIC PANEL - Abnormal; Notable for the following components:      Result Value   Glucose, Bld 111 (*)    Creatinine, Ser 1.03 (*)    Calcium 8.7 (*)    GFR calc non Af Amer 49 (*)    GFR calc Af Amer 57 (*)    All other components within normal limits  CBC - Abnormal; Notable for the following components:   RDW 16.6 (*)    Platelets 131 (*)    All other components within normal limits  BRAIN NATRIURETIC PEPTIDE  TROPONIN I (HIGH SENSITIVITY)  TROPONIN I (HIGH SENSITIVITY)    EKG EKG Interpretation  Date/Time:  Monday December 15 2019 22:28:48 EDT Ventricular Rate:  88 PR Interval:  158 QRS Duration: 80 QT Interval:  352 QTC Calculation: 425 R Axis:   1 Text Interpretation: Normal sinus rhythm Normal ECG Confirmed by Orpah Greek [phone]) on 12/16/2019 12:48:55 AM   Radiology DG Chest 2 View  Result Date: 12/15/2019 CLINICAL DATA:  Chest pain EXAM: CHEST - 2 VIEW COMPARISON:  11/06/2018 FINDINGS: Frontal and lateral views of the chest demonstrate a stable cardiac silhouette. Chronic areas of scarring are seen bilaterally, without focal airspace disease, effusion, or pneumothorax. There are no acute displaced fractures. Postsurgical changes right breast and axilla. IMPRESSION: 1. No acute intrathoracic process. Electronically Signed   By: Randa Ngo M.D.   On: 12/15/2019 23:01    Procedures Procedures (including critical care time)  Medications Ordered in ED Medications  sodium chloride flush (NS) 0.9 % injection 3 mL (has no administration in time range)    ED Course  I have reviewed the triage vital signs and the nursing notes.  Pertinent labs & imaging results  that were available during my care of the patient were reviewed by me and considered in my medical decision making (see chart for details).    MDM Rules/Calculators/A&P                          Patient evaluated in the emergency department for shortness of breath.  At the time of my evaluation, however, all of her symptoms are resolved.  She is completely without any complaints during the exam and period of observation here in the ER.  Work-up does not suggest cardiac ischemia or congestive heart failure.  Chest x-ray is clear, no pneumonia.  She is asymptomatic and not hypoxic.  No tachycardia or tachypnea.  Patient appears comfortable.  She did arrive on oxygen but this was stopped and she still has no shortness of breath.  At  this point she does not appear to have any complaints and no acute medical condition that requires further work-up, will arrange for transport home.  Final Clinical Impression(s) / ED Diagnoses Final diagnoses:  Shortness of breath    Rx / DC Orders ED Discharge Orders    None       Cella Cappello, Gwenyth Allegra, MD 12/16/19 [phone]

## 2019-12-23 ENCOUNTER — Other Ambulatory Visit: Payer: Self-pay

## 2019-12-23 ENCOUNTER — Telehealth: Payer: Self-pay

## 2019-12-23 NOTE — Telephone Encounter (Signed)
Patient called to ask about if the increase to her metolazone had been placed onto her medication list yet as she did not think the nurses where she lives new of it I told her the changes had been placed on her medication list and asked if  the nurses have access to Epic if so they would see the changes she said she would check with them and if not would call back

## 2019-12-25 ENCOUNTER — Other Ambulatory Visit: Payer: Self-pay

## 2019-12-25 DIAGNOSIS — I890 Lymphedema, not elsewhere classified: Secondary | ICD-10-CM | POA: Diagnosis not present

## 2019-12-25 DIAGNOSIS — E669 Obesity, unspecified: Secondary | ICD-10-CM | POA: Diagnosis not present

## 2019-12-25 DIAGNOSIS — E1169 Type 2 diabetes mellitus with other specified complication: Secondary | ICD-10-CM | POA: Diagnosis not present

## 2019-12-26 LAB — CBC WITH DIFFERENTIAL/PLATELET
Absolute Monocytes: 570 cells/uL (ref 200–950)
Basophils Absolute: 60 cells/uL (ref 0–200)
Basophils Relative: 0.9 %
Eosinophils Absolute: 241 cells/uL (ref 15–500)
Eosinophils Relative: 3.6 %
HCT: 40.1 % (ref 35.0–45.0)
Hemoglobin: 13.2 g/dL (ref 11.7–15.5)
Lymphs Abs: 1367 cells/uL (ref 850–3900)
MCH: 28.6 pg (ref 27.0–33.0)
MCHC: 32.9 g/dL (ref 32.0–36.0)
MCV: 86.8 fL (ref 80.0–100.0)
Monocytes Relative: 8.5 %
Neutro Abs: 4462 cells/uL (ref 1500–7800)
Neutrophils Relative %: 66.6 %
Platelets: 133 10*3/uL — ABNORMAL LOW (ref 140–400)
RBC: 4.62 10*6/uL (ref 3.80–5.10)
RDW: 17 % — ABNORMAL HIGH (ref 11.0–15.0)
Total Lymphocyte: 20.4 %
WBC: 6.7 10*3/uL (ref 3.8–10.8)

## 2019-12-26 LAB — BASIC METABOLIC PANEL WITH GFR
BUN/Creatinine Ratio: 22 (calc) (ref 6–22)
BUN: 20 mg/dL (ref 7–25)
CO2: 29 mmol/L (ref 20–32)
Calcium: 8.6 mg/dL (ref 8.6–10.4)
Chloride: 104 mmol/L (ref 98–110)
Creat: 0.93 mg/dL — ABNORMAL HIGH (ref 0.60–0.88)
GFR, Est African American: 64 mL/min/{1.73_m2} (ref >=60)
GFR, Est Non African American: 55 mL/min/{1.73_m2} — ABNORMAL LOW (ref >=60)
Glucose, Bld: 100 mg/dL — ABNORMAL HIGH (ref 65–99)
Potassium: 4.1 mmol/L (ref 3.5–5.3)
Sodium: 140 mmol/L (ref 135–146)

## 2019-12-26 LAB — LIPID PANEL
Cholesterol: 196 mg/dL (ref <200)
HDL: 51 mg/dL (ref >=50)
LDL Cholesterol (Calc): 124 mg/dL (calc) — ABNORMAL HIGH
Non-HDL Cholesterol (Calc): 145 mg/dL (calc) — ABNORMAL HIGH (ref <130)
Total CHOL/HDL Ratio: 3.8 (calc) (ref <5.0)
Triglycerides: 106 mg/dL (ref <150)

## 2019-12-26 LAB — HEMOGLOBIN A1C
Hgb A1c MFr Bld: 5.8 % of total Hgb — ABNORMAL HIGH (ref <5.7)
Mean Plasma Glucose: 120 (calc)
eAG (mmol/L): 6.6 (calc)

## 2020-01-01 ENCOUNTER — Non-Acute Institutional Stay: Payer: Medicare Other | Admitting: Nurse Practitioner

## 2020-01-01 ENCOUNTER — Other Ambulatory Visit: Payer: Self-pay

## 2020-01-01 ENCOUNTER — Encounter: Payer: Self-pay | Admitting: Nurse Practitioner

## 2020-01-01 VITALS — BP 110/70 | HR 83 | Temp 97.3°F | Ht 65.0 in | Wt 268.8 lb

## 2020-01-01 DIAGNOSIS — R441 Visual hallucinations: Secondary | ICD-10-CM | POA: Diagnosis not present

## 2020-01-01 DIAGNOSIS — K219 Gastro-esophageal reflux disease without esophagitis: Secondary | ICD-10-CM | POA: Diagnosis not present

## 2020-01-01 DIAGNOSIS — E785 Hyperlipidemia, unspecified: Secondary | ICD-10-CM | POA: Diagnosis not present

## 2020-01-01 DIAGNOSIS — E039 Hypothyroidism, unspecified: Secondary | ICD-10-CM | POA: Diagnosis not present

## 2020-01-01 DIAGNOSIS — I5032 Chronic diastolic (congestive) heart failure: Secondary | ICD-10-CM

## 2020-01-01 MED ORDER — ATORVASTATIN CALCIUM 10 MG PO TABS: 10.0000 mg | ORAL_TABLET | Freq: Every day | ORAL | 3 refills | Status: DC

## 2020-01-01 NOTE — Assessment & Plan Note (Signed)
TSH 3.58 09/18/19, continue Synthroid.

## 2020-01-01 NOTE — Assessment & Plan Note (Signed)
12/25/19 LDL 134, 06/30/19 LDL 110, will start Atorvastatin 20mg  qd, repeat lipid panel.

## 2020-01-01 NOTE — Assessment & Plan Note (Signed)
Compensated clinically, continue Spironolactone, Metolazone.

## 2020-01-01 NOTE — Patient Instructions (Signed)
Will try Seroquel as needed since the patient has no occurrence of visual hallucination for over a year. Started Atorvastatin 10mg  by mouth daily, will follow up lipid panel in 3 months

## 2020-01-01 NOTE — Progress Notes (Signed)
Location:   clinic [place]   Place of Service:  Clinic (12) Provider: Marlana Latus NP  Code Status: DNR Goals of Care: [place] 01/29/2019  Does Patient Have a Medical Advance Directive? Yes  Type of Paramedic of [place];Living will  Does patient want to make changes to medical advance directive? No - Patient declined  Copy of [place] in Chart? Yes - validated most recent copy scanned in chart (See row information)  Would patient like information on creating a medical advance directive? -  Pre-existing out of facility DNR order (yellow form or pink MOST form) -     Chief Complaint  Patient presents with   Medical Management of Chronic Issues    Patient returns to the clinic for 4 week follow up   Health Maintenance    Foot exam, urine mcroalbumin, dexa. Patient is following up with ophthalmologist.     HPI: Patient is a 86 y.o. female seen today for medical management of chronic diseases.    ED eval 12/15/19 chest pain, SOB, uses O2 at night. Resolved symptoms in ED, work up did not suggest cardiac ischemia or CHF or PNA.   Hx of CHF, chronic edema BLE, takes Metolazone 2.5mg  qd, Spironolactone 25mg  qd.   GERD, takes Omeprazole 20mg  bid, prn Zofran  Hyperlipidemia, LDL is elevated, trended up gradually  Hypothyroidism, takes Synthroid 19mcg qd.   OSA  Visual hallucination, no occurrence for over a year, takes Quetiapine 12.5mg  qd  Depression/anxiety, takes Sertraline 100mg  qd.   CAD ASA 81mg  qd,   Vit B12, supplemented with Vit B12  Dementia, high functional, [place], takes Memantine 10mg  bid     Past Medical History:  Diagnosis Date   Altered mental status    ARTHRITIS, KNEES, BILATERAL    Breast CA ([place]) 2000   s/p R lumpectomy and XRT   CHF (congestive heart failure) (HCC)    DEPRESSION    DJD (degenerative joint disease) of knee    GERD    HYPERLIPIDEMIA    HYPERTENSION    HYPOTHYROIDISM     postsurgical   Hypoxia    MIGRAINE HEADACHE    Morbid obesity (HCC)    OSA (obstructive sleep apnea) 01/17/2011 dx   Oxygen dependent    Urge incontinence    UTI (lower urinary tract infection) 12/2014    Past Surgical History:  Procedure Laterality Date   ABDOMINAL HYSTERECTOMY     ADENOIDECTOMY     BREAST BIOPSY  2000   BREAST LUMPECTOMY  2000   CATARACT EXTRACTION     CHOLECYSTECTOMY  1995   CYSTOSCOPY/URETEROSCOPY/HOLMIUM LASER Right 06/28/2015   Procedure: CYSTOSCOPY RIGHT RETROGRAD RIGHT URETEROSCOPY/HOLMIUM LASER WITH RIGHT STENT PLACEMENT;  Surgeon: Alexis Frock, MD;  Location: [facility];  Service: Urology;  Laterality: Right;   PARATHYROIDECTOMY     2-3 removed   REPLACEMENT TOTAL KNEE BILATERAL  1999, 2005   Right leg femur repaired     THYROIDECTOMY     TONSILLECTOMY     TUBAL LIGATION      Allergies  Allergen Reactions   Azithromycin Itching   Beta Adrenergic Blockers Other (See Comments)    Depression    Ciprofloxacin Other (See Comments)    Edgy and very jumpy     Codeine Nausea And Vomiting   Darvon [Propoxyphene] Nausea And Vomiting   Epinephrine Other (See Comments)    Rapid pulse, sweats   Klonopin [Clonazepam] Other (See Comments)    Makes  her feel very suicidal    Oxycodone Other (See Comments)    Patient felt like it altered her mental status "Crazy" . Hallucinations later as well.   Paxil [Paroxetine Hydrochloride] Other (See Comments)    Severe depression    Prednisone Other (See Comments)    "makes me feel really bad"   Propoxyphene Hcl Nausea And Vomiting   Tape Itching   Torsemide Other (See Comments)    Patient stated that she doesn't recall the reaction   Tramadol Other (See Comments)    Feels "jittery"   Vicodin [Hydrocodone-Acetaminophen] Other (See Comments)    Hallucinations   Meloxicam Diarrhea   Other Rash    Polyester sheets "cause me a rash"   Vancomycin Rash    Localized rash related to  infusion rate    Allergies as of 01/01/2020      Reactions   Azithromycin Itching   Beta Adrenergic Blockers Other (See Comments)   Depression   Ciprofloxacin Other (See Comments)   Edgy and very jumpy    Codeine Nausea And Vomiting   Darvon [propoxyphene] Nausea And Vomiting   Epinephrine Other (See Comments)   Rapid pulse, sweats   Klonopin [clonazepam] Other (See Comments)   Makes her feel very suicidal    Oxycodone Other (See Comments)   Patient felt like it altered her mental status "Crazy" . Hallucinations later as well.   Paxil [paroxetine Hydrochloride] Other (See Comments)   Severe depression    Prednisone Other (See Comments)   "makes me feel really bad"   Propoxyphene Hcl Nausea And Vomiting   Tape Itching   Torsemide Other (See Comments)   Patient stated that she doesn't recall the reaction   Tramadol Other (See Comments)   Feels "jittery"   Vicodin [hydrocodone-acetaminophen] Other (See Comments)   Hallucinations   Meloxicam Diarrhea   Other Rash   Polyester sheets "cause me a rash"   Vancomycin Rash   Localized rash related to infusion rate      Medication List       Accurate as of January 01, 2020 11:59 PM. If you have any questions, ask your nurse or doctor.        acetaminophen 325 MG tablet Commonly known as: TYLENOL Take 2 tablets (650 mg total) by mouth every 8 (eight) hours as needed for mild pain or moderate pain.   aspirin 81 MG tablet Take 81 mg by mouth daily.   atorvastatin 10 MG tablet Commonly known as: LIPITOR Take 1 tablet (10 mg total) by mouth daily. Started by: Levone Otten X Traniya Prichett, NP   calcium citrate-vitamin D 315-200 MG-UNIT tablet Commonly known as: CITRACAL+D Take 1 tablet by mouth daily. $RemoveBefo'200mg'sGsEDgrUPzV$  tablet daily   cetirizine 10 MG chewable tablet Commonly known as: ZYRTEC Chew 1 tablet (10 mg total) by mouth daily as needed for allergies.   cyanocobalamin 2000 MCG tablet Take 2,000 mcg by mouth daily.   FIBER-CAPS PO Take 2  capsules by mouth daily.   GenTeal Tears 0.1-0.2-0.3 % Soln Apply 1 drop to eye. Both eyes   loperamide 2 MG capsule Commonly known as: IMODIUM Take 1 capsule (2 mg total) by mouth daily as needed for diarrhea or loose stools.   melatonin 5 MG Tabs Take 1 tablet (5 mg total) by mouth at bedtime.   memantine 10 MG tablet Commonly known as: Namenda Take 1 tablet (10 mg total) by mouth 2 (two) times daily. What changed: when to take this   metolazone 2.5 MG  tablet Commonly known as: ZAROXOLYN Take 1 tablet (2.5 mg total) by mouth daily. Take 2.5 mg One tablet 5 days a week Take 5 mg two tablets 2 days a week Mon and Fri   omeprazole 20 MG capsule Commonly known as: PRILOSEC TAKE 1 CAPSULE TWICE DAILY BEFORE MEALS What changed: See the new instructions.   ondansetron 4 MG tablet Commonly known as: ZOFRAN TAKE (1) TABLET BY MOUTH EVERY SIX HOURS AS NEEDED FOR NAUSEA. What changed:   how much to take  how to take this  when to take this  reasons to take this  additional instructions   OXYGEN Inhale 2 L into the lungs continuous. KEEP O2 SATS >= 90%. Every Shift   potassium chloride 10 MEQ CR capsule Commonly known as: MICRO-K TAKE 4 CAPSULES DAILY.   QUEtiapine 25 MG tablet Commonly known as: SEROQUEL TAKE 1/2 TABLET AT BEDTIME.   saccharomyces boulardii 250 MG capsule Commonly known as: FLORASTOR Take 1 capsule (250 mg total) by mouth daily.   sertraline 100 MG tablet Commonly known as: ZOLOFT TAKE ONE TABLET AT BEDTIME.   spironolactone 25 MG tablet Commonly known as: ALDACTONE TAKE 1 TABLET ONCE DAILY.   Synthroid 137 MCG tablet Generic drug: levothyroxine TAKE 1 TABLET ONCE DAILY BEFORE BREAKFAST. What changed: See the new instructions.       Review of Systems:  Review of Systems  Constitutional: Negative for fatigue, fever and unexpected weight change.  HENT: Positive for hearing loss. Negative for congestion and voice change.   Eyes:  Negative for visual disturbance.  Respiratory: Positive for cough. Negative for choking and shortness of breath.        Chronic hacking cough.   Cardiovascular: Positive for leg swelling. Negative for chest pain and palpitations.  Gastrointestinal: Negative for abdominal pain, diarrhea, nausea and vomiting.       Chronic, occasional diarrhea  Endocrine:       Get cold easily  Genitourinary: Negative for difficulty urinating, dysuria and urgency.  Musculoskeletal: Positive for gait problem.  Skin: Negative for color change.  Neurological: Negative for speech difficulty, weakness, light-headedness and headaches.  Psychiatric/Behavioral: Negative for behavioral problems, hallucinations and sleep disturbance. The patient is not nervous/anxious.     Health Maintenance  Topic Date Due   FOOT EXAM  Never done   OPHTHALMOLOGY EXAM  Never done   URINE MICROALBUMIN  Never done   DEXA SCAN  Never done   INFLUENZA VACCINE  01/18/2020   HEMOGLOBIN A1C  06/26/2020   TETANUS/TDAP  11/13/2028   COVID-19 Vaccine  Completed   PNA vac Low Risk Adult  Completed    Physical Exam: Vitals:   01/01/20 1350  BP: 110/70  Pulse: 83  Temp: (!) 97.3 F (36.3 C)  SpO2: 92%  Weight: 268 lb 12.8 oz (121.9 kg)  Height: $Remove'5\' 5"'jsZgRxs$  (1.651 m)   Body mass index is 44.73 kg/m. Physical Exam Vitals and nursing note reviewed.  Constitutional:      Appearance: Normal appearance.  HENT:     Head: Normocephalic and atraumatic.     Mouth/Throat:     Mouth: Mucous membranes are moist.  Eyes:     Extraocular Movements: Extraocular movements intact.     Conjunctiva/sclera: Conjunctivae normal.     Pupils: Pupils are equal, round, and reactive to light.  Cardiovascular:     Rate and Rhythm: Normal rate and regular rhythm.     Heart sounds: No murmur heard.   Pulmonary:  Breath sounds: Rales present.     Comments: Right basilar rales.  Abdominal:     General: Bowel sounds are normal.      Palpations: Abdomen is soft.     Tenderness: There is no abdominal tenderness.  Genitourinary:    Vagina: No vaginal discharge.     Rectum: Guaiac result negative.     Comments: Small external hemorrhoids 5-6pm, no injury.  Musculoskeletal:     Cervical back: Normal range of motion and neck supple.     Right lower leg: Edema present.     Left lower leg: Edema present.     Comments: Trace edema BLE  Skin:    General: Skin is warm and dry.     Comments: Brownish venous insufficiency skin changes BLE  Neurological:     General: No focal deficit present.     Mental Status: She is alert and oriented to person, place, and time. Mental status is at baseline.     Gait: Gait abnormal.  Psychiatric:        Mood and Affect: Mood normal.        Behavior: Behavior normal.        Thought Content: Thought content normal.        Judgment: Judgment normal.     Labs reviewed: Basic Metabolic Panel: Recent Labs    06/30/19 1007 07/15/19 0730 09/17/19 0835 10/14/19 0815 12/15/19 2236 12/25/19 0700  NA   < >  --   --  141 138 140  K   < >  --   --  4.0 3.8 4.1  CL   < >  --   --  103 100 104  CO2   < >  --   --  $R'28 28 29  'BA$ GLUCOSE   < >  --   --  93 111* 100*  BUN   < >  --   --  $R'21 19 20  'AM$ CREATININE   < >  --   --  0.90* 1.03* 0.93*  CALCIUM   < >  --   --  8.6 8.7* 8.6  TSH  --  0.15* 3.58  --   --   --    < > = values in this interval not displayed.   Liver Function Tests: Recent Labs    06/30/19 1007 10/14/19 0815  AST 13 12  ALT 9 9  BILITOT 0.3 0.5  PROT 6.0* 6.2   No results for input(s): LIPASE, AMYLASE in the last 8760 hours. No results for input(s): AMMONIA in the last 8760 hours. CBC: Recent Labs    07/15/19 0730 07/15/19 0730 10/14/19 0815 12/15/19 2236 12/25/19 0700  WBC 6.9   < > 6.8 7.3 6.7  NEUTROABS 4,816  --  4,740  --  4,462  HGB 13.3   < > 13.6 13.6 13.2  HCT 40.9   < > 41.3 43.1 40.1  MCV 87.4   < > 87.7 89.2 86.8  PLT 158   < > 131* 131* 133*    < > = values in this interval not displayed.   Lipid Panel: Recent Labs    06/30/19 1007 12/25/19 0700  CHOL 179 196  HDL 48* 51  LDLCALC 110* 124*  TRIG 105 106  CHOLHDL 3.7 3.8   Lab Results  Component Value Date   HGBA1C 5.8 (H) 12/25/2019    Procedures since last visit: DG Chest 2 View  Result Date: 12/15/2019 CLINICAL DATA:  Chest pain  EXAM: CHEST - 2 VIEW COMPARISON:  11/06/2018 FINDINGS: Frontal and lateral views of the chest demonstrate a stable cardiac silhouette. Chronic areas of scarring are seen bilaterally, without focal airspace disease, effusion, or pneumothorax. There are no acute displaced fractures. Postsurgical changes right breast and axilla. IMPRESSION: 1. No acute intrathoracic process. Electronically Signed   By: Randa Ngo M.D.   On: 12/15/2019 23:01    Assessment/Plan  Hyperlipidemia LDL goal <70 12/25/19 LDL 134, 06/30/19 LDL 110, will start Atorvastatin 20mg  qd, repeat lipid panel.   Hallucination, visual No hallucination for a year, will trial of prn Seroquel.   Chronic diastolic CHF (congestive heart failure) ([place]) Compensated clinically, continue Spironolactone, Metolazone.   Acid reflux Stable, continue Omeprazole.   Hypothyroidism (acquired) TSH 3.58 09/18/19, continue Synthroid.    Labs/tests ordered:  Lipid panel in 3 months  Next appt:  02/06/2020

## 2020-01-01 NOTE — Assessment & Plan Note (Signed)
No hallucination for a year, will trial of prn Seroquel.

## 2020-01-01 NOTE — Assessment & Plan Note (Signed)
Stable, continue Omeprazole.  

## 2020-01-02 ENCOUNTER — Encounter: Payer: Self-pay | Admitting: Nurse Practitioner

## 2020-01-07 ENCOUNTER — Telehealth: Payer: Self-pay | Admitting: *Deleted

## 2020-01-07 ENCOUNTER — Other Ambulatory Visit: Payer: Self-pay | Admitting: Internal Medicine

## 2020-01-07 NOTE — Telephone Encounter (Signed)
Patient called and stated that she was prescribed Atorvastatin on Monday.  Stated that around [facility] she starts becoming Nauseated and has to take Zofran. Stated that it started when she started taking the medication.  Patient stated that she does not like how it makes her feel.   Please Advise.

## 2020-01-08 NOTE — Telephone Encounter (Signed)
Patient notified and agreed.  Medication list updated. Placed in allergy list.

## 2020-01-08 NOTE — Telephone Encounter (Signed)
Advise the patient to stop taking Atorvastatin

## 2020-01-08 NOTE — Telephone Encounter (Signed)
Patient awaiting response. Resent Message.

## 2020-01-08 NOTE — Addendum Note (Signed)
Addended by: Rafael Bihari A on: 01/08/2020 11:10 AM   Modules accepted: Orders

## 2020-01-30 DIAGNOSIS — H6121 Impacted cerumen, right ear: Secondary | ICD-10-CM | POA: Diagnosis not present

## 2020-02-03 DIAGNOSIS — L821 Other seborrheic keratosis: Secondary | ICD-10-CM | POA: Diagnosis not present

## 2020-02-03 DIAGNOSIS — L853 Xerosis cutis: Secondary | ICD-10-CM | POA: Diagnosis not present

## 2020-02-03 DIAGNOSIS — D1801 Hemangioma of skin and subcutaneous tissue: Secondary | ICD-10-CM | POA: Diagnosis not present

## 2020-02-03 DIAGNOSIS — I872 Venous insufficiency (chronic) (peripheral): Secondary | ICD-10-CM | POA: Diagnosis not present

## 2020-02-03 DIAGNOSIS — L570 Actinic keratosis: Secondary | ICD-10-CM | POA: Diagnosis not present

## 2020-02-03 DIAGNOSIS — L820 Inflamed seborrheic keratosis: Secondary | ICD-10-CM | POA: Diagnosis not present

## 2020-02-03 DIAGNOSIS — Z85828 Personal history of other malignant neoplasm of skin: Secondary | ICD-10-CM | POA: Diagnosis not present

## 2020-02-06 ENCOUNTER — Non-Acute Institutional Stay: Payer: Medicare Other | Admitting: Internal Medicine

## 2020-02-06 ENCOUNTER — Other Ambulatory Visit: Payer: Self-pay

## 2020-02-06 ENCOUNTER — Encounter: Payer: Self-pay | Admitting: Internal Medicine

## 2020-02-06 VITALS — BP 108/66 | HR 88 | Temp 98.0°F | Ht 65.0 in | Wt 272.6 lb

## 2020-02-06 DIAGNOSIS — E1169 Type 2 diabetes mellitus with other specified complication: Secondary | ICD-10-CM

## 2020-02-06 DIAGNOSIS — I890 Lymphedema, not elsewhere classified: Secondary | ICD-10-CM | POA: Diagnosis not present

## 2020-02-06 DIAGNOSIS — I5032 Chronic diastolic (congestive) heart failure: Secondary | ICD-10-CM | POA: Diagnosis not present

## 2020-02-06 DIAGNOSIS — R238 Other skin changes: Secondary | ICD-10-CM

## 2020-02-06 DIAGNOSIS — E785 Hyperlipidemia, unspecified: Secondary | ICD-10-CM | POA: Diagnosis not present

## 2020-02-06 DIAGNOSIS — K219 Gastro-esophageal reflux disease without esophagitis: Secondary | ICD-10-CM | POA: Diagnosis not present

## 2020-02-06 DIAGNOSIS — I10 Essential (primary) hypertension: Secondary | ICD-10-CM

## 2020-02-06 DIAGNOSIS — E039 Hypothyroidism, unspecified: Secondary | ICD-10-CM

## 2020-02-06 DIAGNOSIS — E669 Obesity, unspecified: Secondary | ICD-10-CM | POA: Diagnosis not present

## 2020-02-06 DIAGNOSIS — K589 Irritable bowel syndrome without diarrhea: Secondary | ICD-10-CM | POA: Diagnosis not present

## 2020-02-06 DIAGNOSIS — R441 Visual hallucinations: Secondary | ICD-10-CM | POA: Diagnosis not present

## 2020-02-06 NOTE — Progress Notes (Signed)
Location:  [place] of Service:  Clinic (12)  Provider:   Code Status:  Goals of Care:  Advanced Directives 01/29/2019  Does Patient Have a Medical Advance Directive? Yes  Type of Paramedic of [place];Living will  Does patient want to make changes to medical advance directive? No - Patient declined  Copy of [place] in Chart? Yes - validated most recent copy scanned in chart (See row information)  Would patient like information on creating a medical advance directive? -  Pre-existing out of facility DNR order (yellow form or pink MOST form) -     Chief Complaint  Patient presents with  . Medical Management of Chronic Issues    Patient returns to the clinic for her 4 month follow up.  [name] Health Maintenance    Dexa, urine micro.    HPI: Patient is a 86 y.o. female seen today for medical management of chronic diseases.    Patient has h/o Hypertension, Chronic LE edema with Diastolic CHF and Lymphedema, Anxiety, B12 def,OSA but does not use CPAP only Oxygen , Diabetes Type 2, Diarrhea chronic, S/P Skin Tear on Right Leg Healing well Patient is little worried. Not Infected C/O Runny Nose Taking Claritin QD Was on Flonase Before. No Fever or Sinus tenderness  Doing well otherwise All Other Chronic Issues are stable  Past Medical History:  Diagnosis Date  . Altered mental status   . ARTHRITIS, KNEES, BILATERAL   . Breast CA ([place]) 2000   s/p R lumpectomy and XRT  . CHF (congestive heart failure) ([place])   . DEPRESSION   . DJD (degenerative joint disease) of knee   . GERD   . HYPERLIPIDEMIA   . HYPERTENSION   . HYPOTHYROIDISM    postsurgical  . Hypoxia   . MIGRAINE HEADACHE   . Morbid obesity ([place])   . OSA (obstructive sleep apnea) 01/17/2011 dx  . Oxygen dependent   . Urge incontinence   . UTI (lower urinary tract infection) 12/2014    Past Surgical History:  Procedure Laterality Date  . ABDOMINAL  HYSTERECTOMY    . ADENOIDECTOMY    . BREAST BIOPSY  2000  . BREAST LUMPECTOMY  2000  . CATARACT EXTRACTION    . CHOLECYSTECTOMY  1995  . CYSTOSCOPY/URETEROSCOPY/HOLMIUM LASER Right 06/28/2015   Procedure: CYSTOSCOPY RIGHT RETROGRAD RIGHT URETEROSCOPY/HOLMIUM LASER WITH RIGHT STENT PLACEMENT;  Surgeon: Alexis Frock, MD;  Location: [facility];  Service: Urology;  Laterality: Right;  . PARATHYROIDECTOMY     2-3 removed  . REPLACEMENT TOTAL KNEE BILATERAL  1999, 2005  . Right leg femur repaired    . THYROIDECTOMY    . TONSILLECTOMY    . TUBAL LIGATION      Allergies  Allergen Reactions  . Atorvastatin Nausea Only  . Azithromycin Itching  . Beta Adrenergic Blockers Other (See Comments)    Depression   . Ciprofloxacin Other (See Comments)    Edgy and very jumpy    . Codeine Nausea And Vomiting  . Darvon [Propoxyphene] Nausea And Vomiting  . Epinephrine Other (See Comments)    Rapid pulse, sweats  . Klonopin [Clonazepam] Other (See Comments)    Makes her feel very suicidal   . Oxycodone Other (See Comments)    Patient felt like it altered her mental status "Crazy" . Hallucinations later as well.  [name] Paxil [Paroxetine Hydrochloride] Other (See Comments)    Severe depression   . Prednisone Other (See Comments)    "  makes me feel really bad"  . Propoxyphene Hcl Nausea And Vomiting  . Tape Itching  . Torsemide Other (See Comments)    Patient stated that she doesn't recall the reaction  . Tramadol Other (See Comments)    Feels "jittery"  . Vicodin [Hydrocodone-Acetaminophen] Other (See Comments)    Hallucinations  . Meloxicam Diarrhea  . Other Rash    Polyester sheets "cause me a rash"  . Vancomycin Rash    Localized rash related to infusion rate    Outpatient Encounter Medications as of 02/06/2020  Medication Sig  . acetaminophen (TYLENOL) 325 MG tablet Take 2 tablets (650 mg total) by mouth every 8 (eight) hours as needed for mild pain or moderate pain.  . Artificial Tear  Solution (GENTEAL TEARS) 0.1-0.2-0.3 % SOLN Apply 1 drop to eye. Both eyes  . aspirin 81 MG tablet Take 81 mg by mouth daily.   . calcium citrate-vitamin D (CITRACAL+D) 315-200 MG-UNIT tablet Take 1 tablet by mouth daily. $RemoveBefo'200mg'BhXdTyxeqPr$  tablet daily  . Calcium Polycarbophil (FIBER-CAPS PO) Take 2 capsules by mouth daily.  . cetirizine (ZYRTEC) 10 MG chewable tablet Chew 1 tablet (10 mg total) by mouth daily as needed for allergies.  . cyanocobalamin 2000 MCG tablet Take 2,000 mcg by mouth daily.  [name] loperamide (IMODIUM) 2 MG capsule Take 1 capsule (2 mg total) by mouth daily as needed for diarrhea or loose stools.  . Melatonin 5 MG TABS Take 1 tablet (5 mg total) by mouth at bedtime.  . memantine (NAMENDA) 10 MG tablet Take 1 tablet (10 mg total) by mouth 2 (two) times daily. (Patient taking differently: Take 10 mg by mouth daily. )  . metolazone (ZAROXOLYN) 2.5 MG tablet Take 1 tablet (2.5 mg total) by mouth daily. Take 2.5 mg One tablet 5 days a week Take 5 mg two tablets 2 days a week Mon and Fri  . omeprazole (PRILOSEC) 20 MG capsule TAKE 1 CAPSULE TWICE DAILY BEFORE MEALS (Patient taking differently: Take 20 mg by mouth 2 (two) times daily before a meal. )  . ondansetron (ZOFRAN) 4 MG tablet TAKE (1) TABLET BY MOUTH EVERY SIX HOURS AS NEEDED FOR NAUSEA. (Patient taking differently: Take 4 mg by mouth every 6 (six) hours as needed for nausea or vomiting. )  . OXYGEN Inhale 2 L into the lungs continuous. KEEP O2 SATS >= 90%. Every Shift  . potassium chloride (MICRO-K) 10 MEQ CR capsule TAKE 4 CAPSULES DAILY. (Patient taking differently: Take 40 mEq by mouth daily. )  . QUEtiapine (SEROQUEL) 25 MG tablet TAKE 1/2 TABLET AT BEDTIME. (Patient taking differently: Take 12.5 mg by mouth at bedtime. )  . saccharomyces boulardii (FLORASTOR) 250 MG capsule Take 1 capsule (250 mg total) by mouth daily.  . sertraline (ZOLOFT) 100 MG tablet TAKE ONE TABLET AT BEDTIME. (Patient taking differently: Take 100 mg by mouth  at bedtime. )  . spironolactone (ALDACTONE) 25 MG tablet TAKE 1 TABLET ONCE DAILY. (Patient taking differently: Take 25 mg by mouth daily. )  . SYNTHROID 137 MCG tablet TAKE 1 TABLET ONCE DAILY BEFORE BREAKFAST.   No facility-administered encounter medications on file as of 02/06/2020.    Review of Systems:  Review of Systems  Constitutional: Negative.   HENT: Positive for congestion, postnasal drip and rhinorrhea.   Respiratory: Negative.   Cardiovascular: Positive for leg swelling.  Gastrointestinal: Positive for diarrhea.  Genitourinary: Negative.   Musculoskeletal: Positive for gait problem.  Skin: Positive for wound.  Neurological: Positive for weakness.  Psychiatric/Behavioral: Positive for sleep disturbance.    Health Maintenance  Topic Date Due  . URINE MICROALBUMIN  Never done  . DEXA SCAN  Never done  . INFLUENZA VACCINE  01/18/2020  . FOOT EXAM  06/19/2020  . HEMOGLOBIN A1C  06/26/2020  . OPHTHALMOLOGY EXAM  07/20/2020  . TETANUS/TDAP  11/13/2028  . COVID-19 Vaccine  Completed  . PNA vac Low Risk Adult  Completed    Physical Exam: Vitals:   02/06/20 1026  BP: 108/66  Pulse: 88  Temp: 98 F (36.7 C)  SpO2: 93%  Weight: 272 lb 9.6 oz (123.7 kg)  Height: 5\' 5"  (1.651 m)   Body mass index is 45.36 kg/m. Physical Exam Vitals reviewed.  HENT:     Head: Normocephalic.     Right Ear: Tympanic membrane normal.     Left Ear: Tympanic membrane normal.     Nose: Congestion and rhinorrhea present.     Mouth/Throat:     Mouth: Mucous membranes are moist.     Pharynx: Oropharynx is clear.  Eyes:     Pupils: Pupils are equal, round, and reactive to light.  Cardiovascular:     Rate and Rhythm: Normal rate and regular rhythm.     Pulses: Normal pulses.  Pulmonary:     Effort: Pulmonary effort is normal.     Breath sounds: Normal breath sounds.  Abdominal:     General: Abdomen is flat. Bowel sounds are normal.     Palpations: Abdomen is soft.    Musculoskeletal:        General: Swelling present.     Cervical back: Neck supple.     Comments: Improved swelling  Skin:    General: Skin is warm and dry.     Comments: Small Papule in Right leg Healing Skin Lesion  Neurological:     Mental Status: She is alert and oriented to person, place, and time.  Psychiatric:        Mood and Affect: Mood normal.        Thought Content: Thought content normal.     Labs reviewed: Basic Metabolic Panel: Recent Labs    06/30/19 1007 07/15/19 0730 09/17/19 0835 10/14/19 0815 12/15/19 2236 12/25/19 0700  NA   < >  --   --  141 138 140  K   < >  --   --  4.0 3.8 4.1  CL   < >  --   --  103 100 104  CO2   < >  --   --  28 28 29   GLUCOSE   < >  --   --  93 111* 100*  BUN   < >  --   --  21 19 20   CREATININE   < >  --   --  0.90* 1.03* 0.93*  CALCIUM   < >  --   --  8.6 8.7* 8.6  TSH  --  0.15* 3.58  --   --   --    < > = values in this interval not displayed.   Liver Function Tests: Recent Labs    06/30/19 1007 10/14/19 0815  AST 13 12  ALT 9 9  BILITOT 0.3 0.5  PROT 6.0* 6.2   No results for input(s): LIPASE, AMYLASE in the last 8760 hours. No results for input(s): AMMONIA in the last 8760 hours. CBC: Recent Labs    07/15/19 0730 07/15/19 0730 10/14/19 0815 12/15/19 2236 12/25/19 0700  WBC 6.9   < >  6.8 7.3 6.7  NEUTROABS 4,816  --  4,740  --  4,462  HGB 13.3   < > 13.6 13.6 13.2  HCT 40.9   < > 41.3 43.1 40.1  MCV 87.4   < > 87.7 89.2 86.8  PLT 158   < > 131* 131* 133*   < > = values in this interval not displayed.   Lipid Panel: Recent Labs    06/30/19 1007 12/25/19 0700  CHOL 179 196  HDL 48* 51  LDLCALC 110* 124*  TRIG 105 106  CHOLHDL 3.7 3.8   Lab Results  Component Value Date   HGBA1C 5.8 (H) 12/25/2019    Procedures since last visit: No results found.  Assessment/Plan Chronic diastolic CHF (congestive heart failure) ([place]) - Plan: CBC with Differential/Platelet, COMPLETE METABOLIC PANEL WITH  GFR, Magnesium Check Magnesium as c/o Cramps sometimes  Hyperlipidemia LDL goal <70 LDl was 124 She did not tolerate Lipitor Will try Zocor  Allergic Rhinitis Start Flonase Alredy on Zyrtec  Hallucination, visual  Neurological Degenerative disorder Per DR Krista Blue MRI Atropy Continue low dose of Seroquel She will try PRN ON Namenda also  Lymphedema Doing well on Metolazone and Aldactone Allergic to Toresimide Essential hypertension On Diuretics  PreDiabetes A1C less then 6 Papule Will follow with Dermatology  Gastroesophageal reflux disease without esophagitis On Prilosec  Irritable bowel syndrome, unspecified type Imodium PRn  Cognitive impairment Her memory and MMSE in the past has been 30 out of 30. MRI showed generalized atrophy. Continue Namenda for now  Thrombocytopenia Platelets Stable  Hypothyroid TSH Normal in 3/21 Labs/tests ordered:  * No order type specified * Next appt:  04/01/2020

## 2020-02-08 MED ORDER — FLUTICASONE PROPIONATE 50 MCG/ACT NA SUSP
2.0000 | Freq: Every day | NASAL | 6 refills | Status: DC
Start: 2020-02-08 — End: 2020-08-06

## 2020-02-08 MED ORDER — SIMVASTATIN 10 MG PO TABS: 10.0000 mg | ORAL_TABLET | Freq: Every day | ORAL | 3 refills | Status: DC

## 2020-02-09 ENCOUNTER — Other Ambulatory Visit: Payer: Self-pay | Admitting: Internal Medicine

## 2020-02-09 ENCOUNTER — Other Ambulatory Visit: Payer: Self-pay | Admitting: Neurology

## 2020-02-09 NOTE — Telephone Encounter (Signed)
High risk or very high risk warning populated when attempting to refill medication. RX request sent to PCP for review and approval if warranted.   

## 2020-02-16 ENCOUNTER — Telehealth: Payer: Self-pay | Admitting: *Deleted

## 2020-02-16 NOTE — Telephone Encounter (Signed)
Patient notified and agreed.  

## 2020-02-16 NOTE — Telephone Encounter (Signed)
Patient called and stated that Dr. Lyndel Safe placed her on Simvastatin.  Patient stated that she DOES NOT want to take this medication. Stated that she does not want to take a Statin due to the side effects. Stated that she is afraid of it because her husband had so many problems with it when he took it.  Stated that she has not started it and wants something else prescribed or try changing the diet.  Please Advise.

## 2020-02-16 NOTE — Telephone Encounter (Signed)
Let her know that it is ok if she does not want to take Statins. She can stop it and we will discuss about other options in her next visit.

## 2020-03-02 DIAGNOSIS — L570 Actinic keratosis: Secondary | ICD-10-CM | POA: Diagnosis not present

## 2020-03-02 DIAGNOSIS — L853 Xerosis cutis: Secondary | ICD-10-CM | POA: Diagnosis not present

## 2020-03-02 DIAGNOSIS — I872 Venous insufficiency (chronic) (peripheral): Secondary | ICD-10-CM | POA: Diagnosis not present

## 2020-03-02 DIAGNOSIS — Z85828 Personal history of other malignant neoplasm of skin: Secondary | ICD-10-CM | POA: Diagnosis not present

## 2020-03-02 DIAGNOSIS — D1801 Hemangioma of skin and subcutaneous tissue: Secondary | ICD-10-CM | POA: Diagnosis not present

## 2020-03-02 DIAGNOSIS — L821 Other seborrheic keratosis: Secondary | ICD-10-CM | POA: Diagnosis not present

## 2020-03-08 ENCOUNTER — Other Ambulatory Visit: Payer: Self-pay | Admitting: Internal Medicine

## 2020-03-08 ENCOUNTER — Other Ambulatory Visit: Payer: Self-pay | Admitting: Neurology

## 2020-03-09 ENCOUNTER — Other Ambulatory Visit: Payer: Self-pay | Admitting: Internal Medicine

## 2020-03-09 ENCOUNTER — Other Ambulatory Visit: Payer: Self-pay | Admitting: Neurology

## 2020-03-09 NOTE — Telephone Encounter (Signed)
RX was last prescribed by Dr.Yan (Neuorlogist). I will send RX to Dr.Gupta to see if she is willing to prescribe or to advise if I should forward to Dr.Yan.

## 2020-03-09 NOTE — Telephone Encounter (Signed)
High risk or very high risk warning populated (spironolactone and potassium) when attempting to refill medication. RX request sent to PCP for review and approval if warranted.

## 2020-03-24 DIAGNOSIS — H903 Sensorineural hearing loss, bilateral: Secondary | ICD-10-CM | POA: Diagnosis not present

## 2020-03-29 ENCOUNTER — Telehealth: Payer: Self-pay | Admitting: *Deleted

## 2020-03-29 NOTE — Telephone Encounter (Signed)
Patient called and stated that she wants a Covid Test done. Stated that the [facility] Nurse told her that she would have to have an order from you first before she could do it.  Stated that she has fatigue, not sleeping well, Sore Throat. No fever, No cough, No SOB (no more that usual she stated).   Patient is wanting you to contact the nurse there and order a Covid test.  Patient stated that she has had both Covid Vaccines.

## 2020-03-30 DIAGNOSIS — E1159 Type 2 diabetes mellitus with other circulatory complications: Secondary | ICD-10-CM | POA: Diagnosis not present

## 2020-03-30 DIAGNOSIS — L84 Corns and callosities: Secondary | ICD-10-CM | POA: Diagnosis not present

## 2020-03-30 DIAGNOSIS — B351 Tinea unguium: Secondary | ICD-10-CM | POA: Diagnosis not present

## 2020-03-30 NOTE — Telephone Encounter (Signed)
Patient called again this morning and wanted to know if Dr. Lyndel Safe spoke with Nurse at [facility] yet and ordered the testing for Covid to be done. Stated that Nurse has not contacted her yet.

## 2020-03-30 NOTE — Telephone Encounter (Signed)
I have Contacted the Nurse. Mrs Piggott should hear from them Today. If not let me know

## 2020-03-30 NOTE — Telephone Encounter (Signed)
Patient notified and has already had the testing done and it came back NEGATIVE.

## 2020-03-31 DIAGNOSIS — Z23 Encounter for immunization: Secondary | ICD-10-CM | POA: Diagnosis not present

## 2020-04-01 ENCOUNTER — Other Ambulatory Visit: Payer: Self-pay

## 2020-04-01 DIAGNOSIS — E785 Hyperlipidemia, unspecified: Secondary | ICD-10-CM

## 2020-04-01 DIAGNOSIS — I5032 Chronic diastolic (congestive) heart failure: Secondary | ICD-10-CM

## 2020-04-01 LAB — COMPLETE METABOLIC PANEL WITH GFR
AG Ratio: 1.4 (calc) (ref 1.0–2.5)
ALT: 8 U/L (ref 6–29)
AST: 13 U/L (ref 10–35)
Albumin: 3.6 g/dL (ref 3.6–5.1)
Alkaline phosphatase (APISO): 84 U/L (ref 37–153)
BUN/Creatinine Ratio: 13 (calc) (ref 6–22)
BUN: 13 mg/dL (ref 7–25)
CO2: 31 mmol/L (ref 20–32)
Calcium: 8.5 mg/dL — ABNORMAL LOW (ref 8.6–10.4)
Chloride: 101 mmol/L (ref 98–110)
Creat: 0.98 mg/dL — ABNORMAL HIGH (ref 0.60–0.88)
GFR, Est African American: 60 mL/min/{1.73_m2} (ref >=60)
GFR, Est Non African American: 52 mL/min/{1.73_m2} — ABNORMAL LOW (ref >=60)
Globulin: 2.5 g/dL (calc) (ref 1.9–3.7)
Glucose, Bld: 78 mg/dL (ref 65–99)
Potassium: 4.1 mmol/L (ref 3.5–5.3)
Sodium: 140 mmol/L (ref 135–146)
Total Bilirubin: 0.6 mg/dL (ref 0.2–1.2)
Total Protein: 6.1 g/dL (ref 6.1–8.1)

## 2020-04-01 LAB — LIPID PANEL
Cholesterol: 145 mg/dL (ref <200)
HDL: 56 mg/dL (ref >=50)
LDL Cholesterol (Calc): 71 mg/dL (calc)
Non-HDL Cholesterol (Calc): 89 mg/dL (calc) (ref <130)
Total CHOL/HDL Ratio: 2.6 (calc) (ref <5.0)
Triglycerides: 95 mg/dL (ref <150)

## 2020-04-01 LAB — CBC WITH DIFFERENTIAL/PLATELET
Absolute Monocytes: 625 cells/uL (ref 200–950)
Basophils Absolute: 64 cells/uL (ref 0–200)
Basophils Relative: 0.9 %
Eosinophils Absolute: 263 cells/uL (ref 15–500)
Eosinophils Relative: 3.7 %
HCT: 40.6 % (ref 35.0–45.0)
Hemoglobin: 13.1 g/dL (ref 11.7–15.5)
Lymphs Abs: 1484 cells/uL (ref 850–3900)
MCH: 28.1 pg (ref 27.0–33.0)
MCHC: 32.3 g/dL (ref 32.0–36.0)
MCV: 86.9 fL (ref 80.0–100.0)
Monocytes Relative: 8.8 %
Neutro Abs: 4665 cells/uL (ref 1500–7800)
Neutrophils Relative %: 65.7 %
Platelets: 124 10*3/uL — ABNORMAL LOW (ref 140–400)
RBC: 4.67 10*6/uL (ref 3.80–5.10)
RDW: 16.5 % — ABNORMAL HIGH (ref 11.0–15.0)
Total Lymphocyte: 20.9 %
WBC: 7.1 10*3/uL (ref 3.8–10.8)

## 2020-04-01 LAB — MAGNESIUM: Magnesium: 1.5 mg/dL (ref 1.5–2.5)

## 2020-04-02 ENCOUNTER — Other Ambulatory Visit: Payer: Self-pay | Admitting: Internal Medicine

## 2020-04-08 DIAGNOSIS — L814 Other melanin hyperpigmentation: Secondary | ICD-10-CM | POA: Diagnosis not present

## 2020-04-08 DIAGNOSIS — L820 Inflamed seborrheic keratosis: Secondary | ICD-10-CM | POA: Diagnosis not present

## 2020-04-08 DIAGNOSIS — L821 Other seborrheic keratosis: Secondary | ICD-10-CM | POA: Diagnosis not present

## 2020-04-08 DIAGNOSIS — L570 Actinic keratosis: Secondary | ICD-10-CM | POA: Diagnosis not present

## 2020-04-27 DIAGNOSIS — Z23 Encounter for immunization: Secondary | ICD-10-CM | POA: Diagnosis not present

## 2020-04-28 ENCOUNTER — Other Ambulatory Visit: Payer: Self-pay | Admitting: Internal Medicine

## 2020-05-06 ENCOUNTER — Non-Acute Institutional Stay: Payer: Medicare Other | Admitting: Nurse Practitioner

## 2020-05-06 ENCOUNTER — Encounter: Payer: Self-pay | Admitting: Nurse Practitioner

## 2020-05-06 ENCOUNTER — Other Ambulatory Visit: Payer: Self-pay

## 2020-05-06 DIAGNOSIS — R441 Visual hallucinations: Secondary | ICD-10-CM

## 2020-05-06 DIAGNOSIS — F0151 Vascular dementia with behavioral disturbance: Secondary | ICD-10-CM | POA: Diagnosis not present

## 2020-05-06 DIAGNOSIS — I10 Essential (primary) hypertension: Secondary | ICD-10-CM

## 2020-05-06 DIAGNOSIS — K219 Gastro-esophageal reflux disease without esophagitis: Secondary | ICD-10-CM | POA: Diagnosis not present

## 2020-05-06 DIAGNOSIS — M7582 Other shoulder lesions, left shoulder: Secondary | ICD-10-CM | POA: Insufficient documentation

## 2020-05-06 DIAGNOSIS — E785 Hyperlipidemia, unspecified: Secondary | ICD-10-CM

## 2020-05-06 DIAGNOSIS — I890 Lymphedema, not elsewhere classified: Secondary | ICD-10-CM | POA: Diagnosis not present

## 2020-05-06 DIAGNOSIS — R7303 Prediabetes: Secondary | ICD-10-CM | POA: Diagnosis not present

## 2020-05-06 DIAGNOSIS — G4733 Obstructive sleep apnea (adult) (pediatric): Secondary | ICD-10-CM

## 2020-05-06 DIAGNOSIS — E039 Hypothyroidism, unspecified: Secondary | ICD-10-CM | POA: Diagnosis not present

## 2020-05-06 DIAGNOSIS — J302 Other seasonal allergic rhinitis: Secondary | ICD-10-CM

## 2020-05-06 DIAGNOSIS — E538 Deficiency of other specified B group vitamins: Secondary | ICD-10-CM

## 2020-05-06 DIAGNOSIS — F01518 Vascular dementia, unspecified severity, with other behavioral disturbance: Secondary | ICD-10-CM

## 2020-05-06 DIAGNOSIS — F418 Other specified anxiety disorders: Secondary | ICD-10-CM | POA: Diagnosis not present

## 2020-05-06 NOTE — Assessment & Plan Note (Signed)
Hx of chronic edema BLE, takes Metolazone 2.5mg  qd, Spironolactone 25mg  qd. Bun/creat 13/0.98 04/01/20, Mg 1.5

## 2020-05-06 NOTE — Assessment & Plan Note (Signed)
Depression/anxiety, takes Sertraline $RemoveBeforeDE'100mg'[id number]$  qd.

## 2020-05-06 NOTE — Assessment & Plan Note (Signed)
Hgb a1c 5.8 12/25/19

## 2020-05-06 NOTE — Assessment & Plan Note (Addendum)
Takes ASA $RemoveB'81mg'XAFDiFAR$  qd. Bp 134/70 re checked.

## 2020-05-06 NOTE — Assessment & Plan Note (Addendum)
Dementia, high functional, IL, takes Memantine $RemoveBeforeD'10mg'rHdOfjwPrGyxgk$  bid, visual hallucination, MRI showed atrophy.

## 2020-05-06 NOTE — Assessment & Plan Note (Signed)
Allergic rhinitis, takes Flonase, Zyrtec.

## 2020-05-06 NOTE — Assessment & Plan Note (Signed)
CPAP.  

## 2020-05-06 NOTE — Assessment & Plan Note (Signed)
GERD, takes Omeprazole $RemoveBeforeDE'20mg'[id number]$  bid, prn Zofran

## 2020-05-06 NOTE — Assessment & Plan Note (Signed)
Hyperlipidemia, LDL 71 04/01/20, , started Zocor

## 2020-05-06 NOTE — Assessment & Plan Note (Signed)
Hypothyroidism, takes Synthroid 177mcg qd. TSH 3.58 09/17/19

## 2020-05-06 NOTE — Assessment & Plan Note (Signed)
Vit B12, supplemented with Vit B12

## 2020-05-06 NOTE — Assessment & Plan Note (Signed)
Sustained from over reaching motion, most likely, dull ache around the outside tip of the shoulder that gets worse with pushing, pulling, or overhead movement. No deformity, bruise, swelling, redness, or warmth in the area, will refer to therapy, may X-ray if no better.

## 2020-05-06 NOTE — Progress Notes (Signed)
Location:   clinic [facility]   Place of Service:  Clinic (12) Provider: Marlana Latus NP  Code Status: DNR Goals of Care: [place] 01/29/2019  Does Patient Have a Medical Advance Directive? Yes  Type of Paramedic of [place];Living will  Does patient want to make changes to medical advance directive? No - Patient declined  Copy of [place] in Chart? Yes - validated most recent copy scanned in chart (See row information)  Would patient like information on creating a medical advance directive? -  Pre-existing out of facility DNR order (yellow form or pink MOST form) -     Chief Complaint  Patient presents with  . Medical Management of Chronic Issues    Patient returns to the clinic for follow up and discuss her left shoulder pain.     HPI: Patient is a 86 y.o. female seen today for medical management of chronic diseases.    Hx of chronic edema BLE, takes Metolazone 2.5mg  qd, Spironolactone 25mg  qd. Bun/creat 13/0.98 04/01/20, Mg 1.5  Allergic rhinitis, takes Flonase, Zyrtec.              GERD, takes Omeprazole 20mg  bid, prn Zofran             Hyperlipidemia, LDL 71 04/01/20, , started Zocor             Hypothyroidism, takes Synthroid 173mcg qd. TSH 3.58 09/17/19             OSA, CPAP             Visual hallucination, no occurrence for over a year, takes Quetiapine 12.5mg  qd, MRI showed atrophy             Depression/anxiety, takes Sertraline 100mg  qd.              CAD ASA 81mg  qd,              Vit B12, supplemented with Vit B12             Dementia, high functional, [place], takes Memantine 10mg  bid  Prediabetes, Hgb a1c. 5.8 12/25/19   Past Medical History:  Diagnosis Date  . Altered mental status   . ARTHRITIS, KNEES, BILATERAL   . Breast CA ([place]) 2000   s/p R lumpectomy and XRT  . CHF (congestive heart failure) ([place])   . DEPRESSION   . DJD (degenerative joint disease) of knee   . GERD   . HYPERLIPIDEMIA   . HYPERTENSION     . HYPOTHYROIDISM    postsurgical  . Hypoxia   . MIGRAINE HEADACHE   . Morbid obesity ([place])   . OSA (obstructive sleep apnea) 01/17/2011 dx  . Oxygen dependent   . Urge incontinence   . UTI (lower urinary tract infection) 12/2014    Past Surgical History:  Procedure Laterality Date  . ABDOMINAL HYSTERECTOMY    . ADENOIDECTOMY    . BREAST BIOPSY  2000  . BREAST LUMPECTOMY  2000  . CATARACT EXTRACTION    . CHOLECYSTECTOMY  1995  . CYSTOSCOPY/URETEROSCOPY/HOLMIUM LASER Right 06/28/2015   Procedure: CYSTOSCOPY RIGHT RETROGRAD RIGHT URETEROSCOPY/HOLMIUM LASER WITH RIGHT STENT PLACEMENT;  Surgeon: Alexis Frock, MD;  Location: [facility];  Service: Urology;  Laterality: Right;  . PARATHYROIDECTOMY     2-3 removed  . REPLACEMENT TOTAL KNEE BILATERAL  1999, 2005  . Right leg femur repaired    . THYROIDECTOMY    . TONSILLECTOMY    .  TUBAL LIGATION      Allergies  Allergen Reactions  . Atorvastatin Nausea Only  . Azithromycin Itching  . Beta Adrenergic Blockers Other (See Comments)    Depression   . Ciprofloxacin Other (See Comments)    Edgy and very jumpy    . Codeine Nausea And Vomiting  . Darvon [Propoxyphene] Nausea And Vomiting  . Epinephrine Other (See Comments)    Rapid pulse, sweats  . Klonopin [Clonazepam] Other (See Comments)    Makes her feel very suicidal   . Oxycodone Other (See Comments)    Patient felt like it altered her mental status "Crazy" . Hallucinations later as well.  [name] Paxil [Paroxetine Hydrochloride] Other (See Comments)    Severe depression   . Prednisone Other (See Comments)    "makes me feel really bad"  . Propoxyphene Hcl Nausea And Vomiting  . Tape Itching  . Torsemide Other (See Comments)    Patient stated that she doesn't recall the reaction  . Tramadol Other (See Comments)    Feels "jittery"  . Vicodin [Hydrocodone-Acetaminophen] Other (See Comments)    Hallucinations  . Meloxicam Diarrhea  . Other Rash    Polyester sheets "cause me a  rash"  . Vancomycin Rash    Localized rash related to infusion rate    Allergies as of 05/06/2020      Reactions   Atorvastatin Nausea Only   Azithromycin Itching   Beta Adrenergic Blockers Other (See Comments)   Depression   Ciprofloxacin Other (See Comments)   Edgy and very jumpy    Codeine Nausea And Vomiting   Darvon [propoxyphene] Nausea And Vomiting   Epinephrine Other (See Comments)   Rapid pulse, sweats   Klonopin [clonazepam] Other (See Comments)   Makes her feel very suicidal    Oxycodone Other (See Comments)   Patient felt like it altered her mental status "Crazy" . Hallucinations later as well.   Paxil [paroxetine Hydrochloride] Other (See Comments)   Severe depression    Prednisone Other (See Comments)   "makes me feel really bad"   Propoxyphene Hcl Nausea And Vomiting   Tape Itching   Torsemide Other (See Comments)   Patient stated that she doesn't recall the reaction   Tramadol Other (See Comments)   Feels "jittery"   Vicodin [hydrocodone-acetaminophen] Other (See Comments)   Hallucinations   Meloxicam Diarrhea   Other Rash   Polyester sheets "cause me a rash"   Vancomycin Rash   Localized rash related to infusion rate      Medication List       Accurate as of May 06, 2020  5:03 PM. If you have any questions, ask your nurse or doctor.        acetaminophen 325 MG tablet Commonly known as: TYLENOL Take 2 tablets (650 mg total) by mouth every 8 (eight) hours as needed for mild pain or moderate pain.   aspirin 81 MG tablet Take 81 mg by mouth daily.   calcium citrate-vitamin D 315-200 MG-UNIT tablet Commonly known as: CITRACAL+D Take 1 tablet by mouth daily. $RemoveBefo'200mg'ytzWtoqjncB$  tablet daily   cetirizine 10 MG chewable tablet Commonly known as: ZYRTEC Chew 1 tablet (10 mg total) by mouth daily as needed for allergies.   cyanocobalamin 2000 MCG tablet Take 2,000 mcg by mouth daily.   FIBER-CAPS PO Take 2 capsules by mouth daily.   fluticasone 50  MCG/ACT nasal spray Commonly known as: FLONASE Place 2 sprays into both nostrils daily.   GenTeal Tears 0.1-0.2-0.3 %  Soln Apply 1 drop to eye. Both eyes   loperamide 2 MG capsule Commonly known as: IMODIUM Take 1 capsule (2 mg total) by mouth daily as needed for diarrhea or loose stools.   melatonin 5 MG Tabs Take 1 tablet (5 mg total) by mouth at bedtime.   memantine 10 MG tablet Commonly known as: NAMENDA TAKE 1 TABLET BY MOUTH TWICE DAILY.   metolazone 2.5 MG tablet Commonly known as: ZAROXOLYN TAKE 1 TABLET FIVE DAYS A WEEK, TAKE 2 TABLETS ON MONDAY AND FRIDAY   omeprazole 20 MG capsule Commonly known as: PRILOSEC TAKE 1 CAPSULE TWICE DAILY BEFORE MEALS   ondansetron 4 MG tablet Commonly known as: ZOFRAN TAKE (1) TABLET BY MOUTH EVERY SIX HOURS AS NEEDED FOR NAUSEA. What changed:   how much to take  how to take this  when to take this  reasons to take this  additional instructions   OXYGEN Inhale 2 L into the lungs continuous. KEEP O2 SATS >= 90%. Every Shift   potassium chloride 10 MEQ CR capsule Commonly known as: MICRO-K TAKE 4 CAPSULES DAILY.   QUEtiapine 25 MG tablet Commonly known as: SEROQUEL TAKE 1/2 TABLET AT BEDTIME.   saccharomyces boulardii 250 MG capsule Commonly known as: FLORASTOR Take 1 capsule (250 mg total) by mouth daily.   sertraline 100 MG tablet Commonly known as: ZOLOFT TAKE ONE TABLET AT BEDTIME.   simvastatin 10 MG tablet Commonly known as: Zocor Take 1 tablet (10 mg total) by mouth daily.   spironolactone 25 MG tablet Commonly known as: ALDACTONE TAKE 1 TABLET ONCE DAILY.   Synthroid 137 MCG tablet Generic drug: levothyroxine TAKE 1 TABLET ONCE DAILY BEFORE BREAKFAST.       Review of Systems:  Review of Systems  Constitutional: Positive for unexpected weight change. Negative for fatigue and fever.       #11 Ibs weight gained in the past 3 months, no fluid overload  HENT: Positive for hearing loss. Negative  for congestion and voice change.   Eyes: Negative for visual disturbance.  Respiratory: Positive for cough. Negative for choking and shortness of breath.        Chronic hacking cough.   Cardiovascular: Positive for leg swelling. Negative for chest pain and palpitations.  Gastrointestinal: Negative for abdominal pain, diarrhea, nausea and vomiting.       Chronic, occasional diarrhea  Endocrine:       Get cold easily  Genitourinary: Negative for difficulty urinating, dysuria and urgency.  Musculoskeletal: Positive for arthralgias and gait problem.       Pain in the tip of the left shoulder, worsens with pulling, pushing, or overhead movement.   Skin: Negative for color change.  Neurological: Negative for speech difficulty, weakness, light-headedness and headaches.  Psychiatric/Behavioral: Negative for behavioral problems, hallucinations and sleep disturbance. The patient is not nervous/anxious.     Health Maintenance  Topic Date Due  . URINE MICROALBUMIN  Never done  . FOOT EXAM  06/19/2020  . HEMOGLOBIN A1C  06/26/2020  . OPHTHALMOLOGY EXAM  07/20/2020  . TETANUS/TDAP  11/13/2028  . INFLUENZA VACCINE  Completed  . COVID-19 Vaccine  Completed  . PNA vac Low Risk Adult  Completed  . DEXA SCAN  Discontinued    Physical Exam: Vitals:   05/06/20 1512 05/06/20 1554  BP: 136/90 134/70  Pulse: 81   Temp: (!) 96.9 F (36.1 C)   SpO2: 95%   Weight: 283 lb 6.4 oz (128.5 kg)   Height: $Remove'5\' 5"'AFihsJE$  (1.651  m)    Body mass index is 47.16 kg/m. Physical Exam Vitals and nursing note reviewed.  Constitutional:      Appearance: Normal appearance.  HENT:     Head: Normocephalic and atraumatic.     Mouth/Throat:     Mouth: Mucous membranes are moist.  Eyes:     Extraocular Movements: Extraocular movements intact.     Conjunctiva/sclera: Conjunctivae normal.     Pupils: Pupils are equal, round, and reactive to light.  Cardiovascular:     Rate and Rhythm: Normal rate and regular rhythm.      Heart sounds: No murmur heard.   Pulmonary:     Breath sounds: Rales present.     Comments: Right basilar rales.  Abdominal:     General: Bowel sounds are normal.     Palpations: Abdomen is soft.     Tenderness: There is no abdominal tenderness.  Genitourinary:    Vagina: No vaginal discharge.     Rectum: Guaiac result negative.     Comments: Small external hemorrhoids 5-6pm, no injury.  Musculoskeletal:        General: Tenderness present.     Cervical back: Normal range of motion and neck supple.     Right lower leg: Edema present.     Left lower leg: Edema present.     Comments: Trace edema BLE. Outer tip of the left shoulder pain palpated, also with ROM of the left shoulder. No swelling, redness, warmth, or deformity noted   Skin:    General: Skin is warm and dry.     Comments: Brownish venous insufficiency skin changes BLE  Neurological:     General: No focal deficit present.     Mental Status: She is alert and oriented to person, place, and time. Mental status is at baseline.     Gait: Gait abnormal.  Psychiatric:        Mood and Affect: Mood normal.        Behavior: Behavior normal.        Thought Content: Thought content normal.        Judgment: Judgment normal.     Labs reviewed: Basic Metabolic Panel: Recent Labs    07/15/19 0730 09/17/19 0835 10/14/19 0815 12/15/19 2236 12/25/19 0700 04/01/20 0000  NA  --   --    < > 138 140 140  K  --   --    < > 3.8 4.1 4.1  CL  --   --    < > 100 104 101  CO2  --   --    < > $R'28 29 31  'iL$ GLUCOSE  --   --    < > 111* 100* 78  BUN  --   --    < > $R'19 20 13  'eH$ CREATININE  --   --    < > 1.03* 0.93* 0.98*  CALCIUM  --   --    < > 8.7* 8.6 8.5*  MG  --   --   --   --   --  1.5  TSH 0.15* 3.58  --   --   --   --    < > = values in this interval not displayed.   Liver Function Tests: Recent Labs    06/30/19 1007 10/14/19 0815 04/01/20 0000  AST $Re'13 12 13  'jxG$ ALT $R'9 9 8  'Fo$ BILITOT 0.3 0.5 0.6  PROT 6.0* 6.2 6.1   No results  for input(s): LIPASE, AMYLASE in  the last 8760 hours. No results for input(s): AMMONIA in the last 8760 hours. CBC: Recent Labs    10/14/19 0815 10/14/19 0815 12/15/19 2236 12/25/19 0700 04/01/20 0000  WBC 6.8   < > 7.3 6.7 7.1  NEUTROABS 4,740  --   --  4,462 4,665  HGB 13.6   < > 13.6 13.2 13.1  HCT 41.3   < > 43.1 40.1 40.6  MCV 87.7   < > 89.2 86.8 86.9  PLT 131*   < > 131* 133* 124*   < > = values in this interval not displayed.   Lipid Panel: Recent Labs    06/30/19 1007 12/25/19 0700 04/01/20 0000  CHOL 179 196 145  HDL 48* 51 56  LDLCALC 110* 124* 71  TRIG 105 106 95  CHOLHDL 3.7 3.8 2.6   Lab Results  Component Value Date   HGBA1C 5.8 (H) 12/25/2019    Procedures since last visit: No results found.  Assessment/Plan  Lymphedema Hx of chronic edema BLE, takes Metolazone 2.5mg  qd, Spironolactone 25mg  qd. Bun/creat 13/0.98 04/01/20, Mg 1.5   Allergic rhinitis Allergic rhinitis, takes Flonase, Zyrtec.    Acid reflux GERD, takes Omeprazole 20mg  bid, prn Zofran  Hyperlipidemia LDL goal <70 Hyperlipidemia, LDL 71 04/01/20, , started Zocor  Hypothyroidism (acquired) Hypothyroidism, takes Synthroid 126mcg qd. TSH 3.58 09/17/19   OSA (obstructive sleep apnea) CPAP  Hallucination, visual Visual hallucination, no occurrence for over a year, takes Quetiapine 12.5mg  qd, MRI showed atrophy   Prediabetes Hgb a1c 5.8 12/25/19  Depression with anxiety Depression/anxiety, takes Sertraline 100mg  qd.    Vitamin B12 deficiency Vit B12, supplemented with Vit B12  Vascular dementia (HCC) Dementia, high functional, [place], takes Memantine 10mg  bid, visual hallucination, MRI showed atrophy.    Essential hypertension Takes ASA 81mg  qd. Bp 134/70 re checked.   Rotator cuff tendinitis, left Sustained from over reaching motion, most likely, dull ache around the outside tip of the shoulder that gets worse with pushing, pulling, or overhead movement. No deformity,  bruise, swelling, redness, or warmth in the area, will refer to therapy, may X-ray if no better.    Labs/tests ordered: none  Next appt:  3 months

## 2020-05-06 NOTE — Assessment & Plan Note (Signed)
Visual hallucination, no occurrence for over a year, takes Quetiapine 12.5mg  qd, MRI showed atrophy

## 2020-05-21 DIAGNOSIS — M79602 Pain in left arm: Secondary | ICD-10-CM | POA: Diagnosis not present

## 2020-05-21 DIAGNOSIS — R29898 Other symptoms and signs involving the musculoskeletal system: Secondary | ICD-10-CM | POA: Diagnosis not present

## 2020-05-21 DIAGNOSIS — M25512 Pain in left shoulder: Secondary | ICD-10-CM | POA: Diagnosis not present

## 2020-05-21 DIAGNOSIS — M6281 Muscle weakness (generalized): Secondary | ICD-10-CM | POA: Diagnosis not present

## 2020-05-24 DIAGNOSIS — M79602 Pain in left arm: Secondary | ICD-10-CM | POA: Diagnosis not present

## 2020-05-24 DIAGNOSIS — R29898 Other symptoms and signs involving the musculoskeletal system: Secondary | ICD-10-CM | POA: Diagnosis not present

## 2020-05-24 DIAGNOSIS — M25512 Pain in left shoulder: Secondary | ICD-10-CM | POA: Diagnosis not present

## 2020-05-24 DIAGNOSIS — M6281 Muscle weakness (generalized): Secondary | ICD-10-CM | POA: Diagnosis not present

## 2020-05-26 ENCOUNTER — Other Ambulatory Visit: Payer: Self-pay | Admitting: Internal Medicine

## 2020-05-26 DIAGNOSIS — M6281 Muscle weakness (generalized): Secondary | ICD-10-CM | POA: Diagnosis not present

## 2020-05-26 DIAGNOSIS — M25512 Pain in left shoulder: Secondary | ICD-10-CM | POA: Diagnosis not present

## 2020-05-26 DIAGNOSIS — M79602 Pain in left arm: Secondary | ICD-10-CM | POA: Diagnosis not present

## 2020-05-26 DIAGNOSIS — R29898 Other symptoms and signs involving the musculoskeletal system: Secondary | ICD-10-CM | POA: Diagnosis not present

## 2020-05-28 DIAGNOSIS — M79602 Pain in left arm: Secondary | ICD-10-CM | POA: Diagnosis not present

## 2020-05-28 DIAGNOSIS — M25512 Pain in left shoulder: Secondary | ICD-10-CM | POA: Diagnosis not present

## 2020-05-28 DIAGNOSIS — M6281 Muscle weakness (generalized): Secondary | ICD-10-CM | POA: Diagnosis not present

## 2020-05-28 DIAGNOSIS — R29898 Other symptoms and signs involving the musculoskeletal system: Secondary | ICD-10-CM | POA: Diagnosis not present

## 2020-06-01 DIAGNOSIS — M79602 Pain in left arm: Secondary | ICD-10-CM | POA: Diagnosis not present

## 2020-06-01 DIAGNOSIS — M6281 Muscle weakness (generalized): Secondary | ICD-10-CM | POA: Diagnosis not present

## 2020-06-01 DIAGNOSIS — M25512 Pain in left shoulder: Secondary | ICD-10-CM | POA: Diagnosis not present

## 2020-06-01 DIAGNOSIS — R29898 Other symptoms and signs involving the musculoskeletal system: Secondary | ICD-10-CM | POA: Diagnosis not present

## 2020-06-02 DIAGNOSIS — M6281 Muscle weakness (generalized): Secondary | ICD-10-CM | POA: Diagnosis not present

## 2020-06-02 DIAGNOSIS — M79602 Pain in left arm: Secondary | ICD-10-CM | POA: Diagnosis not present

## 2020-06-02 DIAGNOSIS — R29898 Other symptoms and signs involving the musculoskeletal system: Secondary | ICD-10-CM | POA: Diagnosis not present

## 2020-06-02 DIAGNOSIS — M25512 Pain in left shoulder: Secondary | ICD-10-CM | POA: Diagnosis not present

## 2020-06-04 DIAGNOSIS — M79602 Pain in left arm: Secondary | ICD-10-CM | POA: Diagnosis not present

## 2020-06-04 DIAGNOSIS — R29898 Other symptoms and signs involving the musculoskeletal system: Secondary | ICD-10-CM | POA: Diagnosis not present

## 2020-06-04 DIAGNOSIS — M25512 Pain in left shoulder: Secondary | ICD-10-CM | POA: Diagnosis not present

## 2020-06-04 DIAGNOSIS — M6281 Muscle weakness (generalized): Secondary | ICD-10-CM | POA: Diagnosis not present

## 2020-06-08 DIAGNOSIS — B351 Tinea unguium: Secondary | ICD-10-CM | POA: Diagnosis not present

## 2020-06-08 DIAGNOSIS — E1159 Type 2 diabetes mellitus with other circulatory complications: Secondary | ICD-10-CM | POA: Diagnosis not present

## 2020-06-08 DIAGNOSIS — M6281 Muscle weakness (generalized): Secondary | ICD-10-CM | POA: Diagnosis not present

## 2020-06-08 DIAGNOSIS — R29898 Other symptoms and signs involving the musculoskeletal system: Secondary | ICD-10-CM | POA: Diagnosis not present

## 2020-06-08 DIAGNOSIS — M25512 Pain in left shoulder: Secondary | ICD-10-CM | POA: Diagnosis not present

## 2020-06-08 DIAGNOSIS — M79602 Pain in left arm: Secondary | ICD-10-CM | POA: Diagnosis not present

## 2020-06-08 DIAGNOSIS — L84 Corns and callosities: Secondary | ICD-10-CM | POA: Diagnosis not present

## 2020-06-09 DIAGNOSIS — M79602 Pain in left arm: Secondary | ICD-10-CM | POA: Diagnosis not present

## 2020-06-09 DIAGNOSIS — M6281 Muscle weakness (generalized): Secondary | ICD-10-CM | POA: Diagnosis not present

## 2020-06-09 DIAGNOSIS — R29898 Other symptoms and signs involving the musculoskeletal system: Secondary | ICD-10-CM | POA: Diagnosis not present

## 2020-06-09 DIAGNOSIS — M25512 Pain in left shoulder: Secondary | ICD-10-CM | POA: Diagnosis not present

## 2020-06-10 DIAGNOSIS — R29898 Other symptoms and signs involving the musculoskeletal system: Secondary | ICD-10-CM | POA: Diagnosis not present

## 2020-06-10 DIAGNOSIS — M6281 Muscle weakness (generalized): Secondary | ICD-10-CM | POA: Diagnosis not present

## 2020-06-10 DIAGNOSIS — M79602 Pain in left arm: Secondary | ICD-10-CM | POA: Diagnosis not present

## 2020-06-10 DIAGNOSIS — M25512 Pain in left shoulder: Secondary | ICD-10-CM | POA: Diagnosis not present

## 2020-06-16 DIAGNOSIS — R29898 Other symptoms and signs involving the musculoskeletal system: Secondary | ICD-10-CM | POA: Diagnosis not present

## 2020-06-16 DIAGNOSIS — M79602 Pain in left arm: Secondary | ICD-10-CM | POA: Diagnosis not present

## 2020-06-16 DIAGNOSIS — M25512 Pain in left shoulder: Secondary | ICD-10-CM | POA: Diagnosis not present

## 2020-06-16 DIAGNOSIS — M6281 Muscle weakness (generalized): Secondary | ICD-10-CM | POA: Diagnosis not present

## 2020-06-17 DIAGNOSIS — R29898 Other symptoms and signs involving the musculoskeletal system: Secondary | ICD-10-CM | POA: Diagnosis not present

## 2020-06-17 DIAGNOSIS — M25512 Pain in left shoulder: Secondary | ICD-10-CM | POA: Diagnosis not present

## 2020-06-17 DIAGNOSIS — M6281 Muscle weakness (generalized): Secondary | ICD-10-CM | POA: Diagnosis not present

## 2020-06-17 DIAGNOSIS — M79602 Pain in left arm: Secondary | ICD-10-CM | POA: Diagnosis not present

## 2020-06-20 IMAGING — CR DG CHEST 2V
2 series · 2 of 2 positions shown · non-contrast
Comparison: 07/16/2017 CT and CXR 01/12/2017

CLINICAL DATA: Weakness and dyspnea

EXAM:
CHEST - 2 VIEW

[w chest lat]
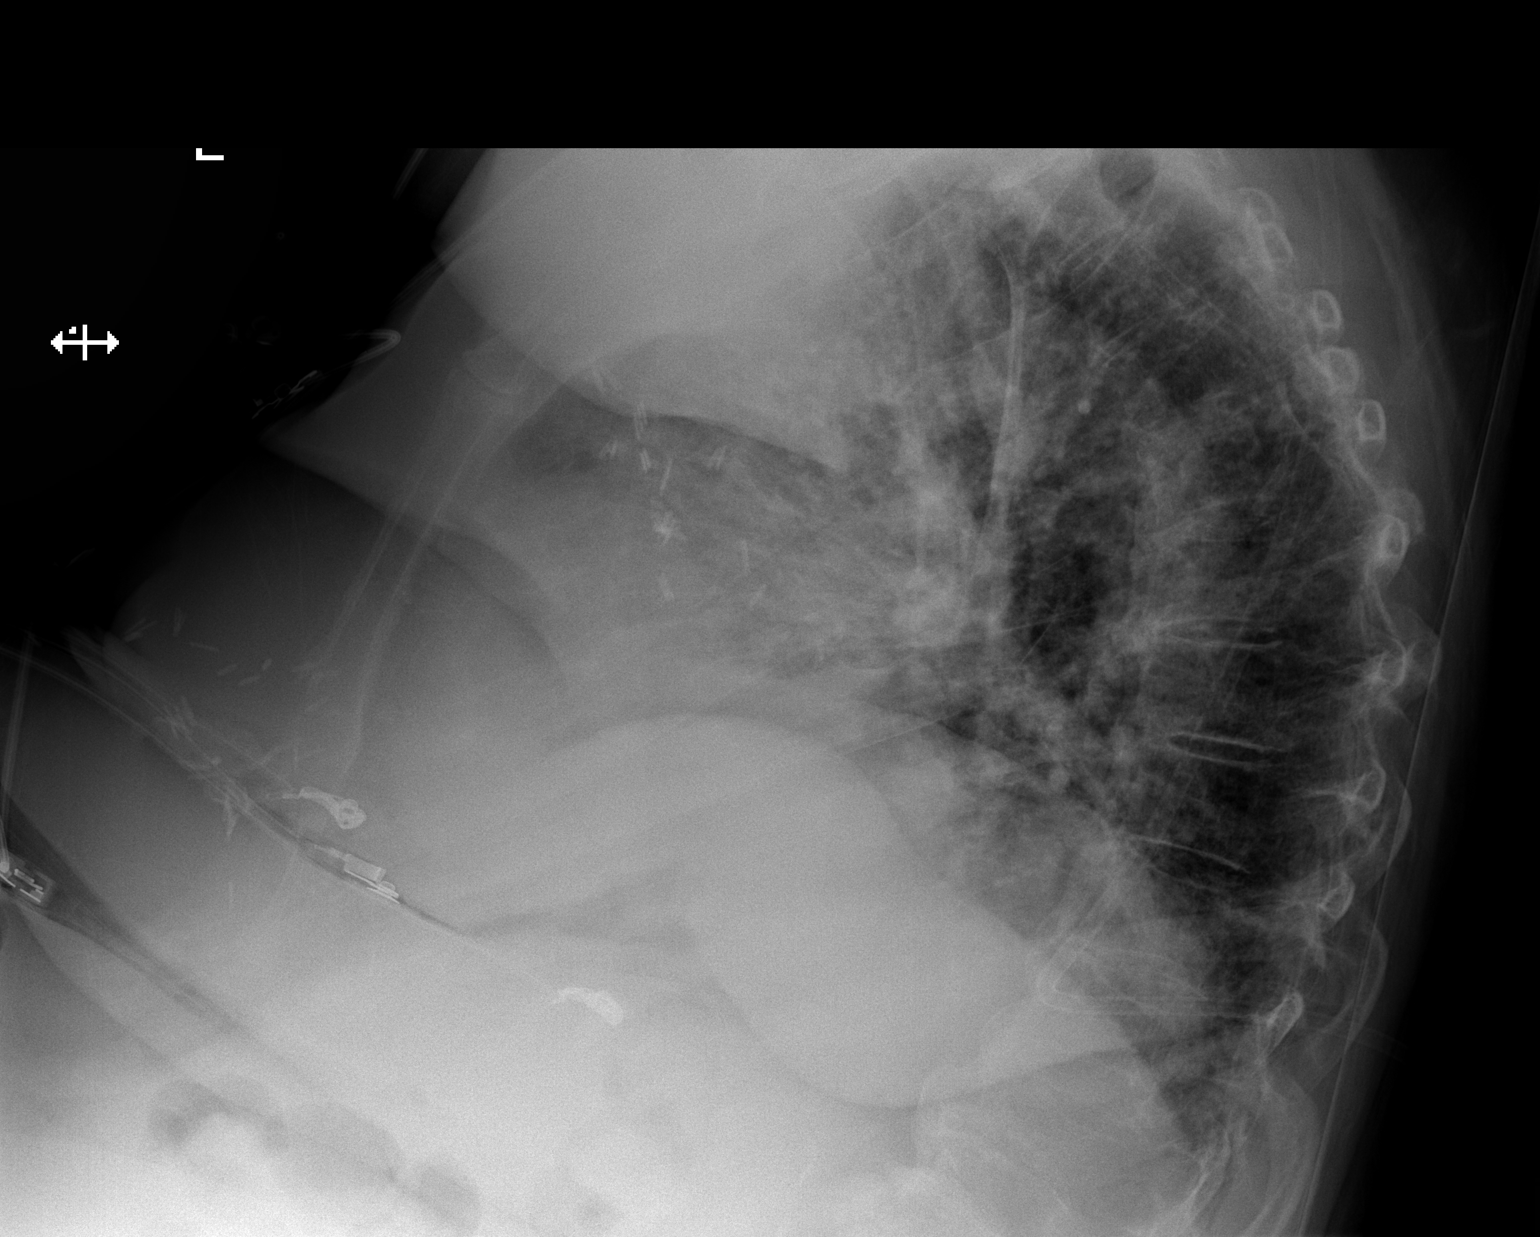

[x chest ap]
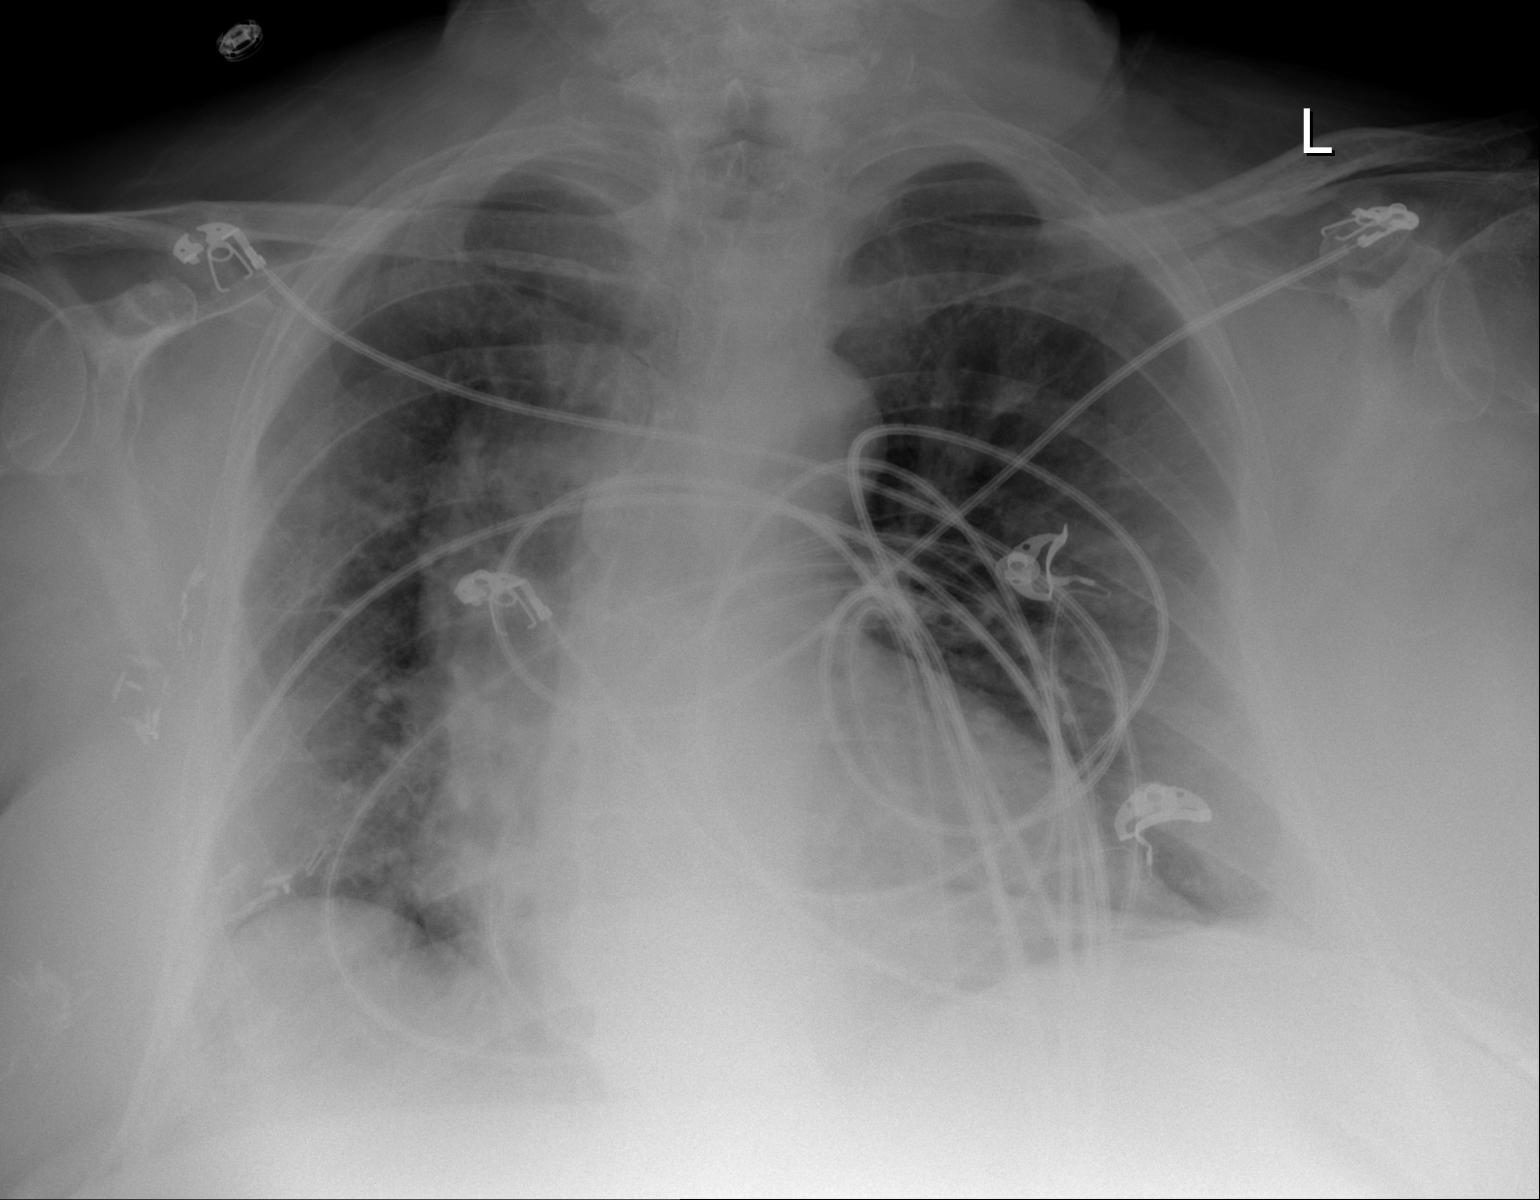

[2 of 2 positions shown; findings below may reference images not displayed]

FINDINGS: Cardiomegaly with mild pulmonary vascular redistribution consistent
with CHF. No pulmonary consolidation, effusion or pneumothorax.
Tortuous atherosclerotic aorta. Surgical clips are seen in the right
axilla. No acute osseous abnormality is noted. Degenerative changes
are present along the dorsal spine.
IMPRESSION: Cardiomegaly with mild CHF.

Aortic atherosclerosis.

## 2020-06-21 DIAGNOSIS — M79602 Pain in left arm: Secondary | ICD-10-CM | POA: Diagnosis not present

## 2020-06-21 DIAGNOSIS — M6281 Muscle weakness (generalized): Secondary | ICD-10-CM | POA: Diagnosis not present

## 2020-06-21 DIAGNOSIS — M25512 Pain in left shoulder: Secondary | ICD-10-CM | POA: Diagnosis not present

## 2020-06-21 DIAGNOSIS — R29898 Other symptoms and signs involving the musculoskeletal system: Secondary | ICD-10-CM | POA: Diagnosis not present

## 2020-06-23 DIAGNOSIS — M79602 Pain in left arm: Secondary | ICD-10-CM | POA: Diagnosis not present

## 2020-06-23 DIAGNOSIS — M25512 Pain in left shoulder: Secondary | ICD-10-CM | POA: Diagnosis not present

## 2020-06-23 DIAGNOSIS — R29898 Other symptoms and signs involving the musculoskeletal system: Secondary | ICD-10-CM | POA: Diagnosis not present

## 2020-06-23 DIAGNOSIS — M6281 Muscle weakness (generalized): Secondary | ICD-10-CM | POA: Diagnosis not present

## 2020-06-24 DIAGNOSIS — R29898 Other symptoms and signs involving the musculoskeletal system: Secondary | ICD-10-CM | POA: Diagnosis not present

## 2020-06-24 DIAGNOSIS — M6281 Muscle weakness (generalized): Secondary | ICD-10-CM | POA: Diagnosis not present

## 2020-06-24 DIAGNOSIS — M25512 Pain in left shoulder: Secondary | ICD-10-CM | POA: Diagnosis not present

## 2020-06-24 DIAGNOSIS — M79602 Pain in left arm: Secondary | ICD-10-CM | POA: Diagnosis not present

## 2020-06-28 DIAGNOSIS — M6281 Muscle weakness (generalized): Secondary | ICD-10-CM | POA: Diagnosis not present

## 2020-06-28 DIAGNOSIS — M79602 Pain in left arm: Secondary | ICD-10-CM | POA: Diagnosis not present

## 2020-06-28 DIAGNOSIS — R29898 Other symptoms and signs involving the musculoskeletal system: Secondary | ICD-10-CM | POA: Diagnosis not present

## 2020-06-28 DIAGNOSIS — M25512 Pain in left shoulder: Secondary | ICD-10-CM | POA: Diagnosis not present

## 2020-06-30 DIAGNOSIS — R29898 Other symptoms and signs involving the musculoskeletal system: Secondary | ICD-10-CM | POA: Diagnosis not present

## 2020-06-30 DIAGNOSIS — M6281 Muscle weakness (generalized): Secondary | ICD-10-CM | POA: Diagnosis not present

## 2020-06-30 DIAGNOSIS — M25512 Pain in left shoulder: Secondary | ICD-10-CM | POA: Diagnosis not present

## 2020-06-30 DIAGNOSIS — M79602 Pain in left arm: Secondary | ICD-10-CM | POA: Diagnosis not present

## 2020-07-02 DIAGNOSIS — M6281 Muscle weakness (generalized): Secondary | ICD-10-CM | POA: Diagnosis not present

## 2020-07-02 DIAGNOSIS — M25512 Pain in left shoulder: Secondary | ICD-10-CM | POA: Diagnosis not present

## 2020-07-02 DIAGNOSIS — R29898 Other symptoms and signs involving the musculoskeletal system: Secondary | ICD-10-CM | POA: Diagnosis not present

## 2020-07-02 DIAGNOSIS — M79602 Pain in left arm: Secondary | ICD-10-CM | POA: Diagnosis not present

## 2020-07-23 ENCOUNTER — Other Ambulatory Visit: Payer: Self-pay | Admitting: Internal Medicine

## 2020-07-26 ENCOUNTER — Other Ambulatory Visit: Payer: Self-pay | Admitting: Internal Medicine

## 2020-08-05 ENCOUNTER — Other Ambulatory Visit: Payer: Self-pay

## 2020-08-05 ENCOUNTER — Encounter: Payer: Self-pay | Admitting: Nurse Practitioner

## 2020-08-05 ENCOUNTER — Ambulatory Visit (INDEPENDENT_AMBULATORY_CARE_PROVIDER_SITE_OTHER): Payer: Medicare Other | Admitting: Nurse Practitioner

## 2020-08-05 DIAGNOSIS — Z0000 Encounter for general adult medical examination without abnormal findings: Secondary | ICD-10-CM

## 2020-08-05 NOTE — Patient Instructions (Addendum)
Tamara Morales , Thank you for taking time to come for your Medicare Wellness Visit. I appreciate your ongoing commitment to your health goals. Please review the following plan we discussed and let me know if I can assist you in the future.   Screening recommendations/referrals: Bone Density - declined at this time.  Recommended yearly ophthalmology/optometry visit for glaucoma screening and checkup Recommended yearly dental visit for hygiene and checkup  Vaccinations: Influenza vaccine up to date Pneumococcal vaccine up to date Tdap vaccine up to date Shingles vaccine up to date    Advanced directives: on file.   Conditions/risks identified: advanced age, obesity, hypertension, hyperlipidemia.   Next appointment: 1 year for AWV    Preventive Care 64 Years and Older, Female Preventive care refers to lifestyle choices and visits with your health care provider that can promote health and wellness. What does preventive care include?  A yearly physical exam. This is also called an annual well check.  Dental exams once or twice a year.  Routine eye exams. Ask your health care provider how often you should have your eyes checked.  Personal lifestyle choices, including:  Daily care of your teeth and gums.  Regular physical activity.  Eating a healthy diet.  Avoiding tobacco and drug use.  Limiting alcohol use.  Practicing safe sex.  Taking low-dose aspirin every day.  Taking vitamin and mineral supplements as recommended by your health care provider. What happens during an annual well check? The services and screenings done by your health care provider during your annual well check will depend on your age, overall health, lifestyle risk factors, and family history of disease. Counseling  Your health care provider may ask you questions about your:  Alcohol use.  Tobacco use.  Drug use.  Emotional well-being.  Home and relationship well-being.  Sexual  activity.  Eating habits.  History of falls.  Memory and ability to understand (cognition).  Work and work Statistician.  Reproductive health. Screening  You may have the following tests or measurements:  Height, weight, and BMI.  Blood pressure.  Lipid and cholesterol levels. These may be checked every 5 years, or more frequently if you are over 88 years old.  Skin check.  Lung cancer screening. You may have this screening every year starting at age 54 if you have a 30-pack-year history of smoking and currently smoke or have quit within the past 15 years.  Fecal occult blood test (FOBT) of the stool. You may have this test every year starting at age 9.  Flexible sigmoidoscopy or colonoscopy. You may have a sigmoidoscopy every 5 years or a colonoscopy every 10 years starting at age 80.  Hepatitis C blood test.  Hepatitis B blood test.  Sexually transmitted disease (STD) testing.  Diabetes screening. This is done by checking your blood sugar (glucose) after you have not eaten for a while (fasting). You may have this done every 1-3 years.  Bone density scan. This is done to screen for osteoporosis. You may have this done starting at age 61.  Mammogram. This may be done every 1-2 years. Talk to your health care provider about how often you should have regular mammograms. Talk with your health care provider about your test results, treatment options, and if necessary, the need for more tests. Vaccines  Your health care provider may recommend certain vaccines, such as:  Influenza vaccine. This is recommended every year.  Tetanus, diphtheria, and acellular pertussis (Tdap, Td) vaccine. You may need a Td booster  every 10 years.  Zoster vaccine. You may need this after age 38.  Pneumococcal 13-valent conjugate (PCV13) vaccine. One dose is recommended after age 19.  Pneumococcal polysaccharide (PPSV23) vaccine. One dose is recommended after age 45. Talk to your health care  provider about which screenings and vaccines you need and how often you need them. This information is not intended to replace advice given to you by your health care provider. Make sure you discuss any questions you have with your health care provider. Document Released: 07/02/2015 Document Revised: 02/23/2016 Document Reviewed: 04/06/2015 Elsevier Interactive Patient Education  2017 [place] Prevention in the Home Falls can cause injuries. They can happen to people of all ages. There are many things you can do to make your home safe and to help prevent falls. What can I do on the outside of my home?  Regularly fix the edges of walkways and driveways and fix any cracks.  Remove anything that might make you trip as you walk through a door, such as a raised step or threshold.  Trim any bushes or trees on the path to your home.  Use bright outdoor lighting.  Clear any walking paths of anything that might make someone trip, such as rocks or tools.  Regularly check to see if handrails are loose or broken. Make sure that both sides of any steps have handrails.  Any raised decks and porches should have guardrails on the edges.  Have any leaves, snow, or ice cleared regularly.  Use sand or salt on walking paths during winter.  Clean up any spills in your garage right away. This includes oil or grease spills. What can I do in the bathroom?  Use night lights.  Install grab bars by the toilet and in the tub and shower. Do not use towel bars as grab bars.  Use non-skid mats or decals in the tub or shower.  If you need to sit down in the shower, use a plastic, non-slip stool.  Keep the floor dry. Clean up any water that spills on the floor as soon as it happens.  Remove soap buildup in the tub or shower regularly.  Attach bath mats securely with double-sided non-slip rug tape.  Do not have throw rugs and other things on the floor that can make you trip. What can I do in  the bedroom?  Use night lights.  Make sure that you have a light by your bed that is easy to reach.  Do not use any sheets or blankets that are too big for your bed. They should not hang down onto the floor.  Have a firm chair that has side arms. You can use this for support while you get dressed.  Do not have throw rugs and other things on the floor that can make you trip. What can I do in the kitchen?  Clean up any spills right away.  Avoid walking on wet floors.  Keep items that you use a lot in easy-to-reach places.  If you need to reach something above you, use a strong step stool that has a grab bar.  Keep electrical cords out of the way.  Do not use floor polish or wax that makes floors slippery. If you must use wax, use non-skid floor wax.  Do not have throw rugs and other things on the floor that can make you trip. What can I do with my stairs?  Do not leave any items on the stairs.  Make  sure that there are handrails on both sides of the stairs and use them. Fix handrails that are broken or loose. Make sure that handrails are as long as the stairways.  Check any carpeting to make sure that it is firmly attached to the stairs. Fix any carpet that is loose or worn.  Avoid having throw rugs at the top or bottom of the stairs. If you do have throw rugs, attach them to the floor with carpet tape.  Make sure that you have a light switch at the top of the stairs and the bottom of the stairs. If you do not have them, ask someone to add them for you. What else can I do to help prevent falls?  Wear shoes that:  Do not have high heels.  Have rubber bottoms.  Are comfortable and fit you well.  Are closed at the toe. Do not wear sandals.  If you use a stepladder:  Make sure that it is fully opened. Do not climb a closed stepladder.  Make sure that both sides of the stepladder are locked into place.  Ask someone to hold it for you, if possible.  Clearly mark and  make sure that you can see:  Any grab bars or handrails.  First and last steps.  Where the edge of each step is.  Use tools that help you move around (mobility aids) if they are needed. These include:  Canes.  Walkers.  Scooters.  Crutches.  Turn on the lights when you go into a dark area. Replace any light bulbs as soon as they burn out.  Set up your furniture so you have a clear path. Avoid moving your furniture around.  If any of your floors are uneven, fix them.  If there are any pets around you, be aware of where they are.  Review your medicines with your doctor. Some medicines can make you feel dizzy. This can increase your chance of falling. Ask your doctor what other things that you can do to help prevent falls. This information is not intended to replace advice given to you by your health care provider. Make sure you discuss any questions you have with your health care provider. Document Released: 04/01/2009 Document Revised: 11/11/2015 Document Reviewed: 07/10/2014 Elsevier Interactive Patient Education  2017 [employer].

## 2020-08-05 NOTE — Progress Notes (Signed)
This service is provided via telemedicine  No vital signs collected/recorded due to the encounter was a telemedicine visit.   Location of patient (ex: home, work): Home  Patient consents to a telephone visit: Yes, see encounter  Dated 08/05/2020  Location of the provider (ex: office, home):  [place]  Name of any referring provider: Veleta Miners, MD  Names of all persons participating in the telemedicine service and their role in the encounter: Sherrie Mustache, Nurse Practitioner, Carroll Kinds, CMA, and patient.   Time spent on call:  12 minutes with medical assistant

## 2020-08-05 NOTE — Progress Notes (Signed)
Subjective:   Tamara Morales is a 87 y.o. female who presents for Medicare Annual (Subsequent) preventive examination.  Review of Systems     Cardiac Risk Factors include: advanced age (>66men, >56 women);obesity (BMI >30kg/m2);sedentary lifestyle;hypertension;dyslipidemia     Objective:    There were no vitals filed for this visit. There is no height or weight on file to calculate BMI.  Advanced Directives 08/05/2020 01/29/2019 01/06/2019 12/10/2018 12/06/2018 12/02/2018 11/22/2018  Does Patient Have a Medical Advance Directive? Yes Yes Yes Yes Yes Yes Yes  Type of Advance Directive Living will;Out of facility DNR (pink MOST or yellow form) [place];Living will Out of facility DNR (pink MOST or yellow form);Living will;Healthcare Power of [place];Living will Out of facility DNR (pink MOST or yellow form);Living will;Healthcare Power of [place];Living will  Does patient want to make changes to medical advance directive? No - Patient declined No - Patient declined No - Patient declined No - Patient declined No - Patient declined No - Patient declined No - Patient declined  Copy of [place] in Chart? - Yes - validated most recent copy scanned in chart (See row information) Yes - validated most recent copy scanned in chart (See row information) Yes - validated most recent copy scanned in chart (See row information) Yes - validated most recent copy scanned in chart (See row information) Yes - validated most recent copy scanned in chart (See row information) Yes - validated most recent copy scanned in chart (See row information)  Would patient like information on creating a medical advance directive? - - - - - - -  Pre-existing out of facility DNR order (yellow form or pink MOST form) Yellow form placed in chart (order not valid for inpatient use);Pink MOST form placed in  chart (order not valid for inpatient use) - Yellow form placed in chart (order not valid for inpatient use);Pink MOST form placed in chart (order not valid for inpatient use) - Yellow form placed in chart (order not valid for inpatient use);Pink MOST form placed in chart (order not valid for inpatient use) - -    Current Medications (verified) Outpatient Encounter Medications as of 08/05/2020  Medication Sig  . acetaminophen (TYLENOL) 325 MG tablet Take 2 tablets (650 mg total) by mouth every 8 (eight) hours as needed for mild pain or moderate pain.  . Artificial Tear Solution (GENTEAL TEARS) 0.1-0.2-0.3 % SOLN Apply 1 drop to eye. Both eyes  . aspirin 81 MG tablet Take 81 mg by mouth daily.  . calcium citrate-vitamin D (CITRACAL+D) 315-200 MG-UNIT tablet Take 1 tablet by mouth daily. $RemoveBefo'200mg'SLhjInnQjmL$  tablet daily  . Calcium Polycarbophil (FIBER-CAPS PO) Take 2 capsules by mouth daily.  . cyanocobalamin 2000 MCG tablet Take 2,000 mcg by mouth daily.  [name] loperamide (IMODIUM) 2 MG capsule Take 1 capsule (2 mg total) by mouth daily as needed for diarrhea or loose stools.  . Melatonin 5 MG TABS Take 1 tablet (5 mg total) by mouth at bedtime.  . memantine (NAMENDA) 10 MG tablet TAKE 1 TABLET BY MOUTH TWICE DAILY.  . metolazone (ZAROXOLYN) 2.5 MG tablet TAKE 1 TABLET FIVE DAYS A WEEK, TAKE 2 TABLETS ON MONDAY AND FRIDAY  . omeprazole (PRILOSEC) 20 MG capsule TAKE 1 CAPSULE TWICE DAILY BEFORE MEALS  . ondansetron (ZOFRAN) 4 MG tablet TAKE (1) TABLET BY MOUTH EVERY SIX HOURS AS NEEDED FOR NAUSEA. (Patient taking differently: Take 4  mg by mouth every 6 (six) hours as needed for nausea or vomiting.)  . OXYGEN Inhale 2 L into the lungs continuous. KEEP O2 SATS >= 90%. Every Shift  . potassium chloride (MICRO-K) 10 MEQ CR capsule TAKE 4 CAPSULES DAILY.  [name] QUEtiapine (SEROQUEL) 25 MG tablet TAKE 1/2 TABLET AT BEDTIME.  [name] saccharomyces boulardii (FLORASTOR) 250 MG capsule Take 1 capsule (250 mg total) by mouth daily.  .  sertraline (ZOLOFT) 100 MG tablet TAKE ONE TABLET AT BEDTIME.  . simvastatin (ZOCOR) 10 MG tablet TAKE 1 TABLET ONCE DAILY.  [name] spironolactone (ALDACTONE) 25 MG tablet TAKE 1 TABLET ONCE DAILY.  . SYNTHROID 137 MCG tablet TAKE 1 TABLET ONCE DAILY BEFORE BREAKFAST.  . fluticasone (FLONASE) 50 MCG/ACT nasal spray Place 2 sprays into both nostrils daily. (Patient not taking: Reported on 08/05/2020)  . [DISCONTINUED] cetirizine (ZYRTEC) 10 MG chewable tablet Chew 1 tablet (10 mg total) by mouth daily as needed for allergies.   No facility-administered encounter medications on file as of 08/05/2020.    Allergies (verified) Atorvastatin, Azithromycin, Beta adrenergic blockers, Ciprofloxacin, Codeine, Darvon [propoxyphene], Epinephrine, Klonopin [clonazepam], Oxycodone, Paxil [paroxetine hydrochloride], Prednisone, Propoxyphene hcl, Tape, Torsemide, Tramadol, Vicodin [hydrocodone-acetaminophen], Meloxicam, Other, and Vancomycin   History: Past Medical History:  Diagnosis Date  . Altered mental status   . ARTHRITIS, KNEES, BILATERAL   . Breast CA ([place]) 2000   s/p R lumpectomy and XRT  . CHF (congestive heart failure) ([place])   . DEPRESSION   . DJD (degenerative joint disease) of knee   . GERD   . HYPERLIPIDEMIA   . HYPERTENSION   . HYPOTHYROIDISM    postsurgical  . Hypoxia   . MIGRAINE HEADACHE   . Morbid obesity ([place])   . OSA (obstructive sleep apnea) 01/17/2011 dx  . Oxygen dependent   . Urge incontinence   . UTI (lower urinary tract infection) 12/2014   Past Surgical History:  Procedure Laterality Date  . ABDOMINAL HYSTERECTOMY    . ADENOIDECTOMY    . BREAST BIOPSY  2000  . BREAST LUMPECTOMY  2000  . CATARACT EXTRACTION    . CHOLECYSTECTOMY  1995  . CYSTOSCOPY/URETEROSCOPY/HOLMIUM LASER Right 06/28/2015   Procedure: CYSTOSCOPY RIGHT RETROGRAD RIGHT URETEROSCOPY/HOLMIUM LASER WITH RIGHT STENT PLACEMENT;  Surgeon: Alexis Frock, MD;  Location: [facility];  Service: Urology;  Laterality:  Right;  . PARATHYROIDECTOMY     2-3 removed  . REPLACEMENT TOTAL KNEE BILATERAL  1999, 2005  . Right leg femur repaired    . THYROIDECTOMY    . TONSILLECTOMY    . TUBAL LIGATION     Family History  Problem Relation Age of Onset  . Heart disease Mother   . Heart disease Father   . Emphysema Sister   . Coronary artery disease Neg Hx    Social History   Socioeconomic History  . Marital status: Married    Spouse name: Not on file  . Number of children: 5  . Years of education: some college  . Highest education level: Not on file  Occupational History  . Occupation: RETIRED    Employer: RETIRED    Comment: worked on office  Tobacco Use  . Smoking status: Former Smoker    Packs/day: 0.25    Years: 2.00    Pack years: 0.50    Types: Cigarettes    Quit date: 06/19/1974    Years since quitting: 46.1  . Smokeless tobacco: Never Used  . Tobacco comment: Quit in late 20's  Vaping Use  .  Vaping Use: Never used  Substance and Sexual Activity  . Alcohol use: No    Alcohol/week: 0.0 standard drinks  . Drug use: No  . Sexual activity: Never  Other Topics Concern  . Not on file  Social History Narrative   Married to husband that has dementia (he lives in the skilled nursing wing) and this is a big stressor. 5 children. 10 grandchildren. 2 greatgrandchildren. All children [place]      Lives at [facility], in independent living.      Retired from multiple different Community education officer, university, Psychologist, educational.       Hobbies: Management consultant, write poetry, craft dolls      Right-handed.      No daily caffeine use.   Social Determinants of Health   Financial Resource Strain: Not on file  Food Insecurity: Not on file  Transportation Needs: Not on file  Physical Activity: Not on file  Stress: Not on file  Social Connections: Not on file    Tobacco Counseling Counseling given: Not Answered Comment: Quit in late 20's   Clinical Intake:  Pre-visit preparation completed:  Yes  Pain : No/denies pain     BMI - recorded: 47 Nutritional Risks: None Diabetes: No  How often do you need to have someone help you when you read instructions, pamphlets, or other written materials from your doctor or pharmacy?: 1 - Never  Diabetic?no         Activities of Daily Living In your present state of health, do you have any difficulty performing the following activities: 08/05/2020  Hearing? Y  Vision? N  Difficulty concentrating or making decisions? Y  Walking or climbing stairs? Y  Dressing or bathing? N  Doing errands, shopping? Y  Comment does not Physiological scientist and eating ? N  Using the Toilet? N  In the past six months, have you accidently leaked urine? Y  Do you have problems with loss of bowel control? N  Managing your Medications? N  Managing your Finances? Y  Housekeeping or managing your Housekeeping? N  Some recent data might be hidden    Patient Care Team: Virgie Dad, MD as PCP - General (Internal Medicine) Minus Breeding, MD as PCP - Cardiology (Cardiology) Azucena Fallen, MD as Consulting Physician (Obstetrics and Gynecology) Chesley Mires, MD as Consulting Physician (Pulmonary Disease) Mast, Man X, NP as Nurse Practitioner (Internal Medicine) Christin Fudge, MD (Inactive) as Consulting Physician (Surgery)  Indicate any recent Medical Services you may have received from other than [facility] providers in the past year (date may be approximate).     Assessment:   This is a routine wellness examination for Tamara Morales.  Hearing/Vision screen  Hearing Screening   '125Hz'$  $[place]'250Hz'UmSbw$'500Hz'$'1000Hz'$'2000Hz'$'3000Hz'$'4000Hz'$'6000Hz'$'8000Hz'$   Right ear:           Left ear:           Comments: Patient wears hearing aids.  Vision Screening Comments: Patient wears glasses for reading. Has had eye exam within past year. Can't remember name of eye doctor  Dietary issues and exercise activities discussed: Current Exercise Habits: The patient does not  participate in regular exercise at present  Goals    . Weight (lb) < 250 lb (113.4 kg)     Would like to lose weight through decreasing portions and limit sweets and carbohydrates       Depression Screen PHQ 2/9 Scores 08/05/2020 08/30/2017 06/07/2017 11/27/2015 11/18/2015 07/29/2015 07/28/2015  PHQ -  2 Score - 0 0 0 0 1 0  Exception Documentation Other- indicate reason in comment box - - - - - -  Not completed Occasional depression patient on medication - - - - - -    Fall Risk Fall Risk  08/05/2020 05/06/2020 02/06/2020 01/01/2020 11/28/2019  Falls in the past year? 0 0 0 0 0  Number falls in past yr: 0 0 0 0 0  Injury with Fall? 0 - - - -  Risk for fall due to : - - - - -    [place]:  Any stairs in or around the home? Yes  If so, are there any without handrails? No  Home free of loose throw rugs in walkways, pet beds, electrical cords, etc? Yes  Adequate lighting in your home to reduce risk of falls? Yes   ASSISTIVE DEVICES UTILIZED TO PREVENT FALLS:  Life alert? Yes  Use of a cane, walker or w/c? Yes  Grab bars in the bathroom? Yes  Shower chair or bench in shower? Yes  Elevated toilet seat or a handicapped toilet? Yes   TIMED UP AND GO:  Was the test performed? No .   Cognitive Function: MMSE - Mini Mental State Exam 03/11/2019 12/31/2018 03/27/2017 03/27/2017  Not completed: - - - (No Data)  Orientation to time 5 3 5 5   Orientation to Place 5 5 5 5   Registration 3 3 3 3   Attention/ Calculation 5 5 5 5   Recall 3 3 3 3   Language- name 2 objects 2 2 2 2   Language- repeat 1 1 1 1   Language- follow 3 step command 3 3 3 3   Language- read & follow direction 1 1 1 1   Write a sentence 1 1 1 1   Copy design 1 1 1 1   Total score 30 28 30 30      6CIT Screen 08/05/2020  What Year? 0 points  What month? 0 points  What time? 0 points  Count back from 20 0 points  Months in reverse 0 points  Repeat phrase 2 points  Total Score 2     Immunizations Immunization History  Administered Date(s) Administered  . Influenza Split 03/20/2011  . Influenza Whole 03/19/2009, 03/19/2012  . Influenza, High Dose Seasonal PF 03/16/2018, 04/20/2019, 03/31/2020  . Influenza,inj,Quad PF,6+ Mos 03/19/2013  . Influenza-Unspecified 03/03/2014, 03/15/2015, 03/30/2016, 04/05/2017  . Meningococcal Polysaccharide 03/15/2015  . Moderna Sars-Covid-2 Vaccination 06/22/2018, 07/21/2019, 04/27/2020  . PPD Test 03/30/2016  . Pneumococcal Conjugate-13 03/12/2014  . Pneumococcal Polysaccharide-23 06/19/2006, 03/15/2015  . Td 10/04/2009  . Tdap 06/19/2005, 11/14/2018  . Zoster 06/19/2010  . Zoster Recombinat (Shingrix) 01/28/2018, 04/22/2018, 05/22/2018    TDAP status: Up to date  Flu Vaccine status: Up to date  Pneumococcal vaccine status: Up to date  Covid-19 vaccine status: Completed vaccines  Qualifies for Shingles Vaccine? Yes   Zostavax completed Yes   Shingrix Completed?: Yes  Screening Tests Health Maintenance  Topic Date Due  . URINE MICROALBUMIN  Never done  . FOOT EXAM  06/19/2020  . HEMOGLOBIN A1C  06/26/2020  . OPHTHALMOLOGY EXAM  07/20/2020  . TETANUS/TDAP  11/13/2028  . INFLUENZA VACCINE  Completed  . COVID-19 Vaccine  Completed  . PNA vac Low Risk Adult  Completed  . DEXA SCAN  Discontinued    Health Maintenance  Health Maintenance Due  Topic Date Due  . URINE MICROALBUMIN  Never done  . FOOT EXAM  06/19/2020  . HEMOGLOBIN  A1C  06/26/2020  . OPHTHALMOLOGY EXAM  07/20/2020    Colorectal cancer screening: No longer required.   Decline bone density  Lung Cancer Screening: (Low Dose CT Chest recommended if Age 53-80 years, 30 pack-year currently smoking OR have quit w/in 15years.) does not qualify.     Additional Screening:  Hepatitis C Screening: does not qualify;   Vision Screening: Recommended annual ophthalmology exams for early detection of glaucoma and other disorders of the eye. Is the  patient up to date with their annual eye exam?  Yes  Who is the provider or what is the name of the office in which the patient attends annual eye exams? unknown If pt is not established with a provider, would they like to be referred to a provider to establish care? No .   Dental Screening: Recommended annual dental exams for proper oral hygiene  Community Resource Referral / Chronic Care Management: CRR required this visit?  No   CCM required this visit?  No      Plan:     I have personally reviewed and noted the following in the patient's chart:   . Medical and social history . Use of alcohol, tobacco or illicit drugs  . Current medications and supplements . Functional ability and status . Nutritional status . Physical activity . Advanced directives . List of other physicians . Hospitalizations, surgeries, and ER visits in previous 12 months . Vitals . Screenings to include cognitive, depression, and falls . Referrals and appointments  In addition, I have reviewed and discussed with patient certain preventive protocols, quality metrics, and best practice recommendations. A written personalized care plan for preventive services as well as general preventive health recommendations were provided to patient.     Lauree Chandler, NP   08/05/2020    Virtual Visit via Telephone Note  I connected with@ on 08/05/20 at 11:00 AM EST by telephone and verified that I am speaking with the correct person using two identifiers.  Location: Patient: home Provider: [facility]    I discussed the limitations, risks, security and privacy concerns of performing an evaluation and management service by telephone and the availability of in person appointments. I also discussed with the patient that there may be a patient responsible charge related to this service. The patient expressed understanding and agreed to proceed.   I discussed the assessment and treatment plan with the patient.  The patient was provided an opportunity to ask questions and all were answered. The patient agreed with the plan and demonstrated an understanding of the instructions.   The patient was advised to call back or seek an in-person evaluation if the symptoms worsen or if the condition fails to improve as anticipated.  I provided 20 minutes of non-face-to-face time during this encounter.  Carlos American. Harle Battiest Avs printed and mailed

## 2020-08-06 ENCOUNTER — Encounter: Payer: Self-pay | Admitting: Internal Medicine

## 2020-08-06 ENCOUNTER — Other Ambulatory Visit: Payer: Self-pay

## 2020-08-06 ENCOUNTER — Non-Acute Institutional Stay: Payer: Medicare Other | Admitting: Internal Medicine

## 2020-08-06 VITALS — BP 116/80 | HR 89 | Temp 97.7°F | Ht 60.0 in | Wt 269.8 lb

## 2020-08-06 DIAGNOSIS — K219 Gastro-esophageal reflux disease without esophagitis: Secondary | ICD-10-CM

## 2020-08-06 DIAGNOSIS — E039 Hypothyroidism, unspecified: Secondary | ICD-10-CM | POA: Diagnosis not present

## 2020-08-06 DIAGNOSIS — R441 Visual hallucinations: Secondary | ICD-10-CM | POA: Diagnosis not present

## 2020-08-06 DIAGNOSIS — I890 Lymphedema, not elsewhere classified: Secondary | ICD-10-CM

## 2020-08-06 DIAGNOSIS — F418 Other specified anxiety disorders: Secondary | ICD-10-CM

## 2020-08-06 DIAGNOSIS — R4189 Other symptoms and signs involving cognitive functions and awareness: Secondary | ICD-10-CM | POA: Diagnosis not present

## 2020-08-06 DIAGNOSIS — J302 Other seasonal allergic rhinitis: Secondary | ICD-10-CM | POA: Diagnosis not present

## 2020-08-06 DIAGNOSIS — G4733 Obstructive sleep apnea (adult) (pediatric): Secondary | ICD-10-CM | POA: Diagnosis not present

## 2020-08-06 DIAGNOSIS — R7303 Prediabetes: Secondary | ICD-10-CM

## 2020-08-06 DIAGNOSIS — I10 Essential (primary) hypertension: Secondary | ICD-10-CM

## 2020-08-06 DIAGNOSIS — E538 Deficiency of other specified B group vitamins: Secondary | ICD-10-CM

## 2020-08-06 DIAGNOSIS — E785 Hyperlipidemia, unspecified: Secondary | ICD-10-CM | POA: Diagnosis not present

## 2020-08-08 MED ORDER — FLUTICASONE PROPIONATE 50 MCG/ACT NA SUSP
2.0000 | Freq: Every day | NASAL | 6 refills | Status: DC
Start: 2020-08-08 — End: 2021-01-10

## 2020-08-08 NOTE — Progress Notes (Signed)
Location:  [facility] of Service:  Clinic (12)  Provider:   Code Status:  Goals of Care:  Advanced Directives 08/05/2020  Does Patient Have a Medical Advance Directive? Yes  Type of Advance Directive Living will;Out of facility DNR (pink MOST or yellow form)  Does patient want to make changes to medical advance directive? No - Patient declined  Copy of [place] in Chart? -  Would patient like information on creating a medical advance directive? -  Pre-existing out of facility DNR order (yellow form or pink MOST form) Yellow form placed in chart (order not valid for inpatient use);Pink MOST form placed in chart (order not valid for inpatient use)     Chief Complaint  Patient presents with  . Medical Management of Chronic Issues    Patient returns to the clinic for her 3 month follow up. She states her memory is getting worse.    HPI: Patient is a 87 y.o. female seen today for medical management of chronic diseases.    Patient has h/o Hypertension,  Chronic LE edema with Diastolic CHF and Lymphedema, Anxiety, B12 def, OSA but does not use CPAP only Oxygen ,  Diabetes Type 2,  Diarrhea chronic,Work up with GI negative Visual Hallucinations Work up with Neurology Possible Neurodegenerative disease  Rhinorrhea That was her only active complaint today.  She had stopped taking Flonase.  Wanted to know if she can get a refill but.  Also needs to start taking her Zantac Staying stable.  Also does not want to take statin. Hallucinations are controlled on Seroquel.  Edema is controlled with metolazone and Aldactone.  Patient lives in [place].  Is mostly uses power wheelchair is able to do her transfers has 3 daughters and 2 sons there is a daughter in [place]     Past Medical History:  Diagnosis Date  . Altered mental status   . ARTHRITIS, KNEES, BILATERAL   . Breast CA ([facility]) 2000   s/p R lumpectomy and XRT  . CHF (congestive heart failure)  ([place])   . DEPRESSION   . DJD (degenerative joint disease) of knee   . GERD   . HYPERLIPIDEMIA   . HYPERTENSION   . HYPOTHYROIDISM    postsurgical  . Hypoxia   . MIGRAINE HEADACHE   . Morbid obesity ([place])   . OSA (obstructive sleep apnea) 01/17/2011 dx  . Oxygen dependent   . Urge incontinence   . UTI (lower urinary tract infection) 12/2014    Past Surgical History:  Procedure Laterality Date  . ABDOMINAL HYSTERECTOMY    . ADENOIDECTOMY    . BREAST BIOPSY  2000  . BREAST LUMPECTOMY  2000  . CATARACT EXTRACTION    . CHOLECYSTECTOMY  1995  . CYSTOSCOPY/URETEROSCOPY/HOLMIUM LASER Right 06/28/2015   Procedure: CYSTOSCOPY RIGHT RETROGRAD RIGHT URETEROSCOPY/HOLMIUM LASER WITH RIGHT STENT PLACEMENT;  Surgeon: Alexis Frock, MD;  Location: [facility];  Service: Urology;  Laterality: Right;  . PARATHYROIDECTOMY     2-3 removed  . REPLACEMENT TOTAL KNEE BILATERAL  1999, 2005  . Right leg femur repaired    . THYROIDECTOMY    . TONSILLECTOMY    . TUBAL LIGATION      Allergies  Allergen Reactions  . Atorvastatin Nausea Only  . Azithromycin Itching  . Beta Adrenergic Blockers Other (See Comments)    Depression   . Ciprofloxacin Other (See Comments)    Edgy and very jumpy    . Codeine Nausea And  Vomiting  . Darvon [Propoxyphene] Nausea And Vomiting  . Epinephrine Other (See Comments)    Rapid pulse, sweats  . Klonopin [Clonazepam] Other (See Comments)    Makes her feel very suicidal   . Oxycodone Other (See Comments)    Patient felt like it altered her mental status "Crazy" . Hallucinations later as well.  [name] Paxil [Paroxetine Hydrochloride] Other (See Comments)    Severe depression   . Prednisone Other (See Comments)    "makes me feel really bad"  . Propoxyphene Hcl Nausea And Vomiting  . Tape Itching  . Torsemide Other (See Comments)    Patient stated that she doesn't recall the reaction  . Tramadol Other (See Comments)    Feels "jittery"  . Vicodin  [Hydrocodone-Acetaminophen] Other (See Comments)    Hallucinations  . Meloxicam Diarrhea  . Other Rash    Polyester sheets "cause me a rash"  . Vancomycin Rash    Localized rash related to infusion rate    Outpatient Encounter Medications as of 08/06/2020  Medication Sig  . acetaminophen (TYLENOL) 500 MG tablet Take 250 mg by mouth every 6 (six) hours as needed.  . Artificial Tear Solution (GENTEAL TEARS) 0.1-0.2-0.3 % SOLN Apply 1 drop to eye. Both eyes  . aspirin 81 MG tablet Take 81 mg by mouth daily.  . calcium citrate-vitamin D (CITRACAL+D) 315-200 MG-UNIT tablet Take 1 tablet by mouth daily. $RemoveBefo'200mg'KYXXYTyoidm$  tablet daily  . Calcium Polycarbophil (FIBER-CAPS PO) Take 2 capsules by mouth daily.  . cyanocobalamin 2000 MCG tablet Take 2,000 mcg by mouth daily.  [name] loperamide (IMODIUM) 2 MG capsule Take 1 capsule (2 mg total) by mouth daily as needed for diarrhea or loose stools.  . Melatonin 5 MG TABS Take 1 tablet (5 mg total) by mouth at bedtime.  . memantine (NAMENDA) 10 MG tablet TAKE 1 TABLET BY MOUTH TWICE DAILY.  . metolazone (ZAROXOLYN) 2.5 MG tablet TAKE 1 TABLET FIVE DAYS A WEEK, TAKE 2 TABLETS ON MONDAY AND FRIDAY  . omeprazole (PRILOSEC) 20 MG capsule TAKE 1 CAPSULE TWICE DAILY BEFORE MEALS  . ondansetron (ZOFRAN) 4 MG tablet TAKE (1) TABLET BY MOUTH EVERY SIX HOURS AS NEEDED FOR NAUSEA. (Patient taking differently: Take 4 mg by mouth every 6 (six) hours as needed for nausea or vomiting.)  . OXYGEN Inhale 2 L into the lungs continuous. KEEP O2 SATS >= 90%. Every Shift  . potassium chloride (MICRO-K) 10 MEQ CR capsule TAKE 4 CAPSULES DAILY.  [name] QUEtiapine (SEROQUEL) 25 MG tablet TAKE 1/2 TABLET AT BEDTIME.  [name] saccharomyces boulardii (FLORASTOR) 250 MG capsule Take 1 capsule (250 mg total) by mouth daily.  . sertraline (ZOLOFT) 100 MG tablet TAKE ONE TABLET AT BEDTIME.  . simvastatin (ZOCOR) 10 MG tablet TAKE 1 TABLET ONCE DAILY.  [name] spironolactone (ALDACTONE) 25 MG tablet TAKE 1 TABLET  ONCE DAILY.  . SYNTHROID 137 MCG tablet TAKE 1 TABLET ONCE DAILY BEFORE BREAKFAST.  . [DISCONTINUED] acetaminophen (TYLENOL) 325 MG tablet Take 2 tablets (650 mg total) by mouth every 8 (eight) hours as needed for mild pain or moderate pain.  . fluticasone (FLONASE) 50 MCG/ACT nasal spray Place 2 sprays into both nostrils daily.  . [DISCONTINUED] fluticasone (FLONASE) 50 MCG/ACT nasal spray Place 2 sprays into both nostrils daily.   No facility-administered encounter medications on file as of 08/06/2020.    Review of Systems:  Review of Systems  Constitutional: Negative.   HENT: Negative.   Respiratory: Negative.   Cardiovascular: Positive for leg  swelling.  Gastrointestinal: Positive for diarrhea.  Genitourinary: Negative.   Musculoskeletal: Positive for arthralgias and gait problem.  Skin: Negative.   Neurological: Negative for dizziness.  Psychiatric/Behavioral: Negative.     Health Maintenance  Topic Date Due  . URINE MICROALBUMIN  Never done  . FOOT EXAM  06/19/2020  . HEMOGLOBIN A1C  06/26/2020  . OPHTHALMOLOGY EXAM  07/20/2020  . TETANUS/TDAP  11/13/2028  . INFLUENZA VACCINE  Completed  . COVID-19 Vaccine  Completed  . PNA vac Low Risk Adult  Completed  . DEXA SCAN  Discontinued    Physical Exam: Vitals:   08/06/20 1005  BP: 116/80  Pulse: 89  Temp: 97.7 F (36.5 C)  SpO2: 94%  Weight: 269 lb 12.8 oz (122.4 kg)  Height: 5' (1.524 m)   Body mass index is 52.69 kg/m. Physical Exam Vitals reviewed.  Constitutional:      Appearance: Normal appearance.  HENT:     Head: Normocephalic.     Nose: Nose normal.     Mouth/Throat:     Mouth: Mucous membranes are moist.     Pharynx: Oropharynx is clear.  Eyes:     Pupils: Pupils are equal, round, and reactive to light.  Cardiovascular:     Rate and Rhythm: Normal rate and regular rhythm.     Pulses: Normal pulses.     Heart sounds: Normal heart sounds.  Pulmonary:     Effort: Pulmonary effort is normal.      Breath sounds: Normal breath sounds.  Abdominal:     General: Abdomen is flat. Bowel sounds are normal.     Palpations: Abdomen is soft.  Musculoskeletal:     Cervical back: Neck supple.     Comments: Mild Swelling Bilateral with Chronic Venous Changes  Skin:    General: Skin is warm.  Neurological:     General: No focal deficit present.     Mental Status: She is alert and oriented to person, place, and time.  Psychiatric:        Mood and Affect: Mood normal.        Thought Content: Thought content normal.     Labs reviewed: Basic Metabolic Panel: Recent Labs    09/17/19 0835 10/14/19 0815 12/15/19 2236 12/25/19 0700 04/01/20 0000  NA  --    < > 138 140 140  K  --    < > 3.8 4.1 4.1  CL  --    < > 100 104 101  CO2  --    < > $R'28 29 31  'Ex$ GLUCOSE  --    < > 111* 100* 78  BUN  --    < > $R'19 20 13  'YS$ CREATININE  --    < > 1.03* 0.93* 0.98*  CALCIUM  --    < > 8.7* 8.6 8.5*  MG  --   --   --   --  1.5  TSH 3.58  --   --   --   --    < > = values in this interval not displayed.   Liver Function Tests: Recent Labs    10/14/19 0815 04/01/20 0000  AST 12 13  ALT 9 8  BILITOT 0.5 0.6  PROT 6.2 6.1   No results for input(s): LIPASE, AMYLASE in the last 8760 hours. No results for input(s): AMMONIA in the last 8760 hours. CBC: Recent Labs    10/14/19 0815 12/15/19 2236 12/25/19 0700 04/01/20 0000  WBC 6.8 7.3 6.7 7.1  NEUTROABS 4,740  --  4,462 4,665  HGB 13.6 13.6 13.2 13.1  HCT 41.3 43.1 40.1 40.6  MCV 87.7 89.2 86.8 86.9  PLT 131* 131* 133* 124*   Lipid Panel: Recent Labs    12/25/19 0700 04/01/20 0000  CHOL 196 145  HDL 51 56  LDLCALC 124* 71  TRIG 106 95  CHOLHDL 3.8 2.6   Lab Results  Component Value Date   HGBA1C 5.8 (H) 12/25/2019    Procedures since last visit: No results found.  Assessment/Plan 1. Seasonal allergic rhinitis, unspecified trigger Restart Flonase and Zyrtec 2. Gastroesophageal reflux disease without esophagitis On  Prilosec  3. Hyperlipidemia LDL goal <70 Discontinue Zocor at her request  4. Hypothyroidism (acquired)  - TSH; Future  5. OSA (obstructive sleep apnea) Just uses Oxygen  6. Hallucination, visual NeurologicalDegenerative disorder Per DR Krista Blue Stable on Seroquel MRI [facility]  7. Prediabetes Repeat A1C  8. Depression with anxiety Mood Stable on Seroquel  9. Vitamin B12 deficiency On Supplement  10. Essential hypertension On Aldactone - COMPLETE METABOLIC PANEL WITH GFR; Future - CBC with Differential/Platelet; Future  11. Cognitive impairment Continue Namenda Her memory and MMSE in the past has been 30 out of 30. MRI showed generalized atrophy  12. Lymphedema Doing well on Metolazone and Aldactone Did nto tolerate Lasix and Toresimide  Irritable bowel syndrome, unspecified type Imodium PRn  Labs/tests ordered:  * No order type specified * Next appt:  11/25/2020  Up to date on VAccination

## 2020-08-15 ENCOUNTER — Other Ambulatory Visit: Payer: Self-pay | Admitting: Internal Medicine

## 2020-08-16 NOTE — Telephone Encounter (Signed)
Pended Rx's and sent to Dr. Lyndel Safe for approval due to [place] Warning.

## 2020-08-24 DIAGNOSIS — L814 Other melanin hyperpigmentation: Secondary | ICD-10-CM | POA: Diagnosis not present

## 2020-08-24 DIAGNOSIS — L821 Other seborrheic keratosis: Secondary | ICD-10-CM | POA: Diagnosis not present

## 2020-08-24 DIAGNOSIS — Z85828 Personal history of other malignant neoplasm of skin: Secondary | ICD-10-CM | POA: Diagnosis not present

## 2020-08-24 DIAGNOSIS — L570 Actinic keratosis: Secondary | ICD-10-CM | POA: Diagnosis not present

## 2020-09-28 DIAGNOSIS — L814 Other melanin hyperpigmentation: Secondary | ICD-10-CM | POA: Diagnosis not present

## 2020-09-28 DIAGNOSIS — L570 Actinic keratosis: Secondary | ICD-10-CM | POA: Diagnosis not present

## 2020-09-28 DIAGNOSIS — I872 Venous insufficiency (chronic) (peripheral): Secondary | ICD-10-CM | POA: Diagnosis not present

## 2020-09-28 DIAGNOSIS — L853 Xerosis cutis: Secondary | ICD-10-CM | POA: Diagnosis not present

## 2020-09-28 DIAGNOSIS — L820 Inflamed seborrheic keratosis: Secondary | ICD-10-CM | POA: Diagnosis not present

## 2020-09-28 DIAGNOSIS — L821 Other seborrheic keratosis: Secondary | ICD-10-CM | POA: Diagnosis not present

## 2020-10-11 IMAGING — CR DG CHEST 2V
2 series · 2 of 2 positions shown · non-contrast
Comparison: Radiographs 05/06/2018 and 04/23/2018. Chest CT
04/23/2018.

CLINICAL DATA: Shortness of breath since yesterday. Chest pressure.
History breast cancer, obesity and oxygen dependence.

EXAM:
CHEST - 2 VIEW

[chest lat]
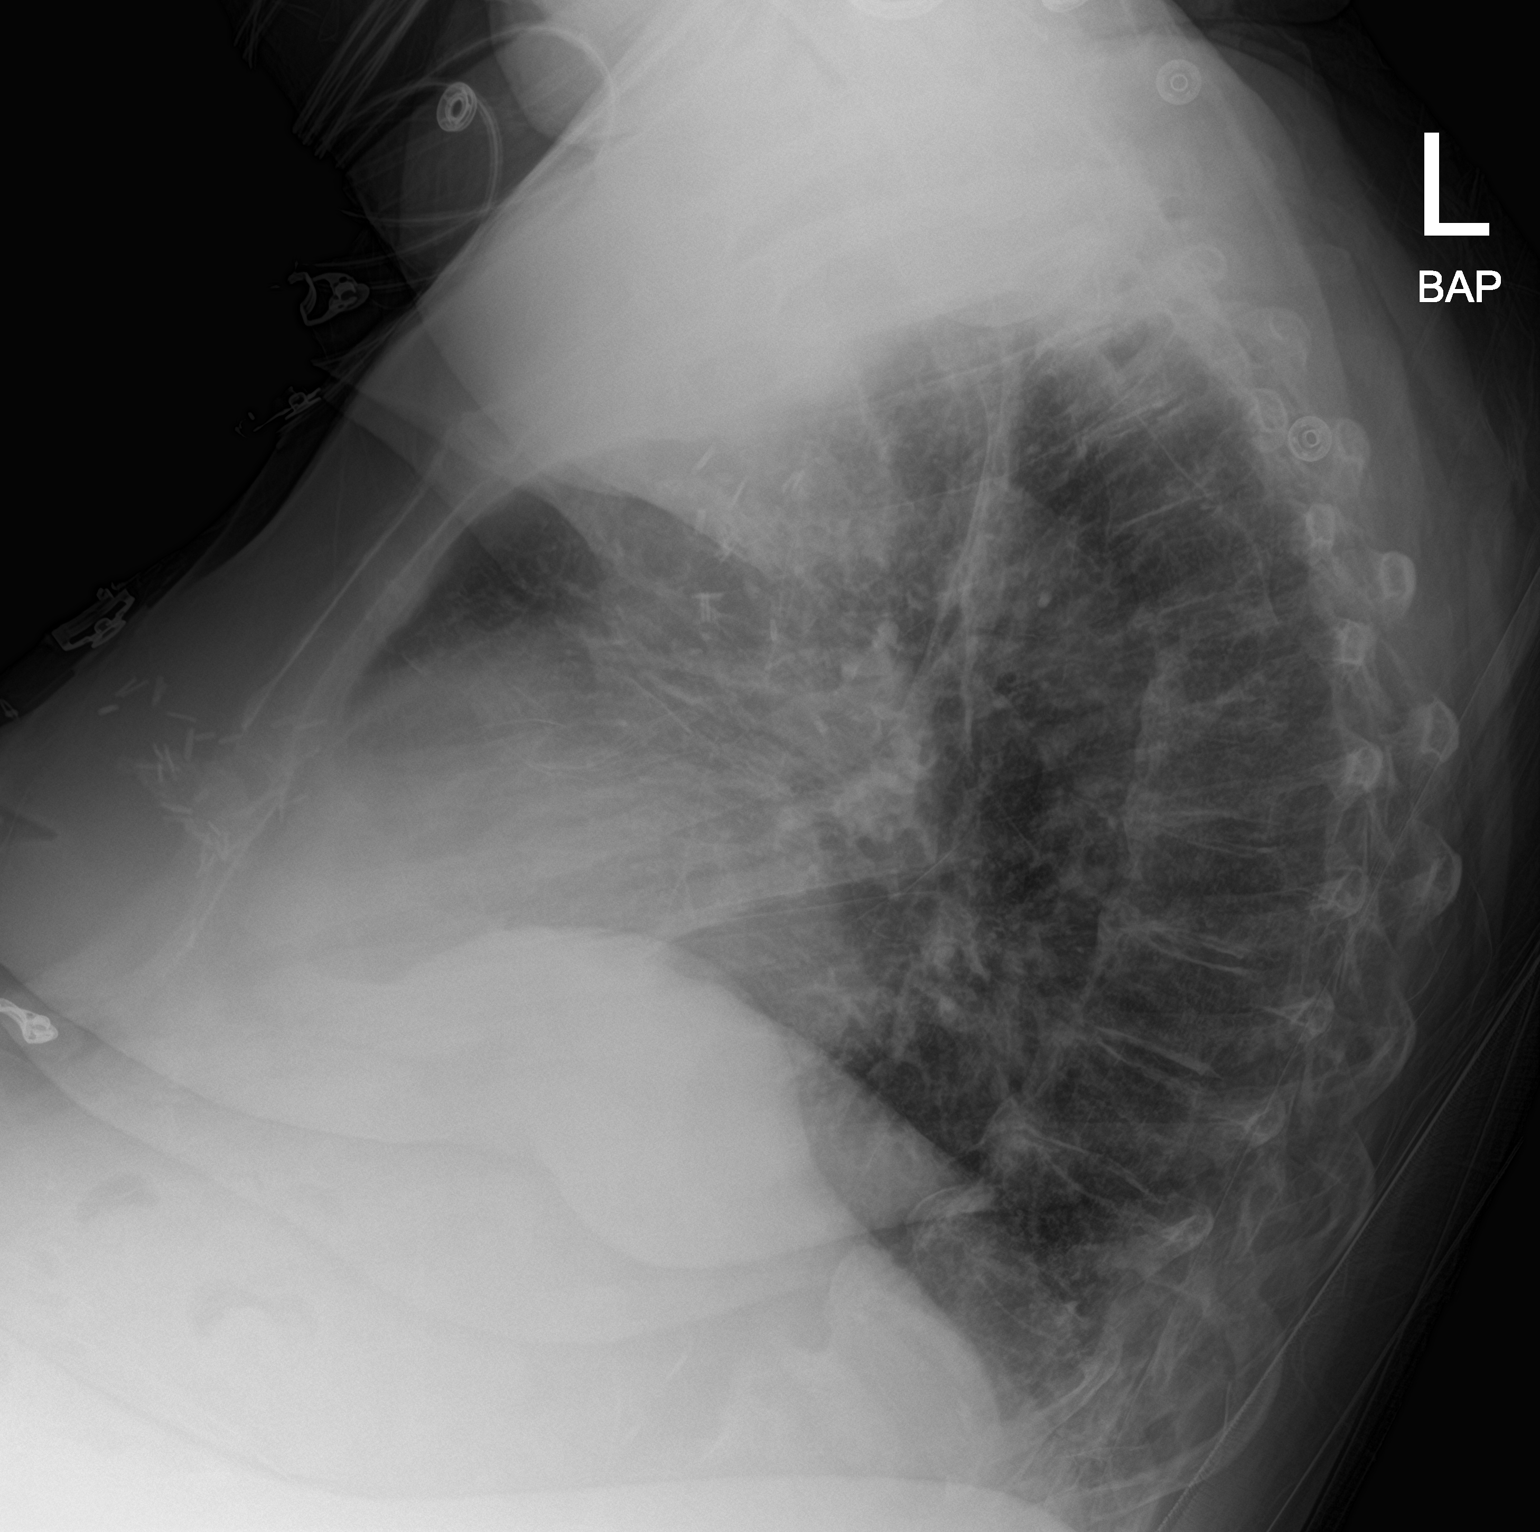

[chest ap]
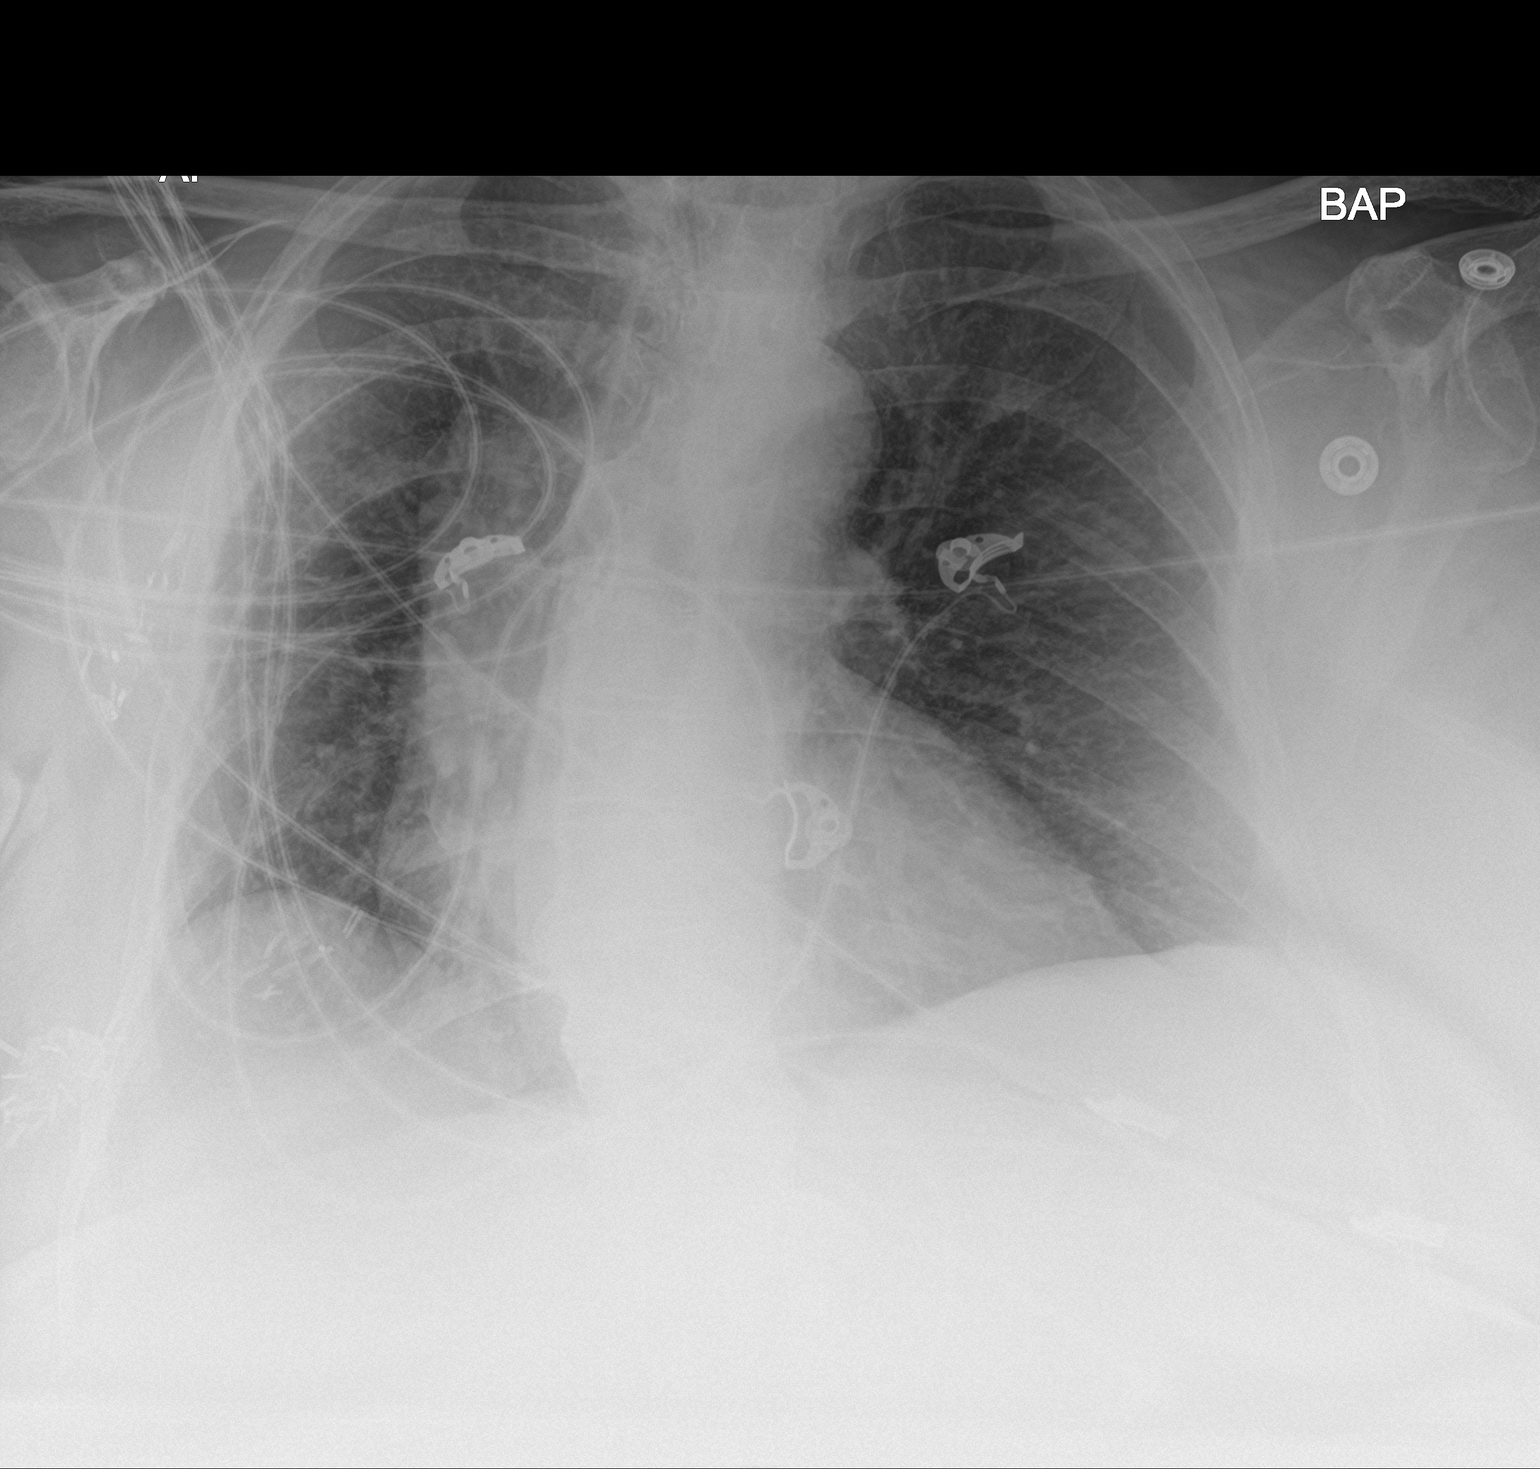

[2 of 2 positions shown; findings below may reference images not displayed]

FINDINGS: Stable cardiomegaly and aortic atherosclerosis. There is mild
chronic lung disease without superimposed airspace disease, pleural
effusion or pneumothorax. Postsurgical changes are present in the
right breast and right axilla. There is a convex right thoracic
scoliosis without acute osseous findings. Multiple telemetry leads
overlie the chest.
IMPRESSION: Stable chest without evidence of acute cardiopulmonary process.

## 2020-10-26 DIAGNOSIS — L84 Corns and callosities: Secondary | ICD-10-CM | POA: Diagnosis not present

## 2020-10-26 DIAGNOSIS — E1159 Type 2 diabetes mellitus with other circulatory complications: Secondary | ICD-10-CM | POA: Diagnosis not present

## 2020-10-26 DIAGNOSIS — L820 Inflamed seborrheic keratosis: Secondary | ICD-10-CM | POA: Diagnosis not present

## 2020-10-26 DIAGNOSIS — I872 Venous insufficiency (chronic) (peripheral): Secondary | ICD-10-CM | POA: Diagnosis not present

## 2020-10-26 DIAGNOSIS — L814 Other melanin hyperpigmentation: Secondary | ICD-10-CM | POA: Diagnosis not present

## 2020-10-26 DIAGNOSIS — L570 Actinic keratosis: Secondary | ICD-10-CM | POA: Diagnosis not present

## 2020-10-26 DIAGNOSIS — D692 Other nonthrombocytopenic purpura: Secondary | ICD-10-CM | POA: Diagnosis not present

## 2020-10-26 DIAGNOSIS — B351 Tinea unguium: Secondary | ICD-10-CM | POA: Diagnosis not present

## 2020-11-05 ENCOUNTER — Other Ambulatory Visit: Payer: Self-pay | Admitting: Internal Medicine

## 2020-11-10 DIAGNOSIS — F4320 Adjustment disorder, unspecified: Secondary | ICD-10-CM | POA: Diagnosis not present

## 2020-11-16 DIAGNOSIS — Z23 Encounter for immunization: Secondary | ICD-10-CM | POA: Diagnosis not present

## 2020-11-17 DIAGNOSIS — F4320 Adjustment disorder, unspecified: Secondary | ICD-10-CM | POA: Diagnosis not present

## 2020-11-23 DIAGNOSIS — F4320 Adjustment disorder, unspecified: Secondary | ICD-10-CM | POA: Diagnosis not present

## 2020-11-25 ENCOUNTER — Other Ambulatory Visit: Payer: Self-pay

## 2020-11-25 DIAGNOSIS — I10 Essential (primary) hypertension: Secondary | ICD-10-CM

## 2020-11-25 DIAGNOSIS — L821 Other seborrheic keratosis: Secondary | ICD-10-CM | POA: Diagnosis not present

## 2020-11-25 DIAGNOSIS — L570 Actinic keratosis: Secondary | ICD-10-CM | POA: Diagnosis not present

## 2020-11-25 DIAGNOSIS — E039 Hypothyroidism, unspecified: Secondary | ICD-10-CM

## 2020-11-25 DIAGNOSIS — R7303 Prediabetes: Secondary | ICD-10-CM | POA: Diagnosis not present

## 2020-11-25 DIAGNOSIS — I872 Venous insufficiency (chronic) (peripheral): Secondary | ICD-10-CM | POA: Diagnosis not present

## 2020-11-26 LAB — CBC WITH DIFFERENTIAL/PLATELET
Absolute Monocytes: 523 cells/uL (ref 200–950)
Basophils Absolute: 80 cells/uL (ref 0–200)
Basophils Relative: 1.2 %
Eosinophils Absolute: 248 cells/uL (ref 15–500)
Eosinophils Relative: 3.7 %
HCT: 41.2 % (ref 35.0–45.0)
Hemoglobin: 13.3 g/dL (ref 11.7–15.5)
Lymphs Abs: 1394 cells/uL (ref 850–3900)
MCH: 28.3 pg (ref 27.0–33.0)
MCHC: 32.3 g/dL (ref 32.0–36.0)
MCV: 87.7 fL (ref 80.0–100.0)
Monocytes Relative: 7.8 %
Neutro Abs: 4456 cells/uL (ref 1500–7800)
Neutrophils Relative %: 66.5 %
Platelets: 116 10*3/uL — ABNORMAL LOW (ref 140–400)
RBC: 4.7 10*6/uL (ref 3.80–5.10)
RDW: 16.4 % — ABNORMAL HIGH (ref 11.0–15.0)
Total Lymphocyte: 20.8 %
WBC: 6.7 10*3/uL (ref 3.8–10.8)

## 2020-11-26 LAB — COMPLETE METABOLIC PANEL WITH GFR
AG Ratio: 1.3 (calc) (ref 1.0–2.5)
ALT: 7 U/L (ref 6–29)
AST: 12 U/L (ref 10–35)
Albumin: 3.5 g/dL — ABNORMAL LOW (ref 3.6–5.1)
Alkaline phosphatase (APISO): 88 U/L (ref 37–153)
BUN/Creatinine Ratio: 20 (calc) (ref 6–22)
BUN: 23 mg/dL (ref 7–25)
CO2: 32 mmol/L (ref 20–32)
Calcium: 8.7 mg/dL (ref 8.6–10.4)
Chloride: 101 mmol/L (ref 98–110)
Creat: 1.16 mg/dL — ABNORMAL HIGH (ref 0.60–0.88)
GFR, Est African American: 49 mL/min/{1.73_m2} — ABNORMAL LOW (ref >=60)
GFR, Est Non African American: 42 mL/min/{1.73_m2} — ABNORMAL LOW (ref >=60)
Globulin: 2.7 g/dL (calc) (ref 1.9–3.7)
Glucose, Bld: 91 mg/dL (ref 65–99)
Potassium: 4.1 mmol/L (ref 3.5–5.3)
Sodium: 142 mmol/L (ref 135–146)
Total Bilirubin: 0.4 mg/dL (ref 0.2–1.2)
Total Protein: 6.2 g/dL (ref 6.1–8.1)

## 2020-11-26 LAB — HEMOGLOBIN A1C
Hgb A1c MFr Bld: 6.1 % of total Hgb — ABNORMAL HIGH (ref <5.7)
Mean Plasma Glucose: 128 mg/dL
eAG (mmol/L): 7.1 mmol/L

## 2020-11-26 LAB — TSH: TSH: 4.59 mIU/L — ABNORMAL HIGH (ref 0.40–4.50)

## 2020-12-02 ENCOUNTER — Other Ambulatory Visit: Payer: Self-pay

## 2020-12-02 ENCOUNTER — Encounter: Payer: Medicare Other | Admitting: Nurse Practitioner

## 2020-12-02 DIAGNOSIS — F4320 Adjustment disorder, unspecified: Secondary | ICD-10-CM | POA: Diagnosis not present

## 2020-12-03 ENCOUNTER — Other Ambulatory Visit: Payer: Self-pay | Admitting: Internal Medicine

## 2020-12-09 ENCOUNTER — Encounter: Payer: Medicare Other | Admitting: Nurse Practitioner

## 2020-12-09 ENCOUNTER — Other Ambulatory Visit: Payer: Self-pay

## 2020-12-09 NOTE — Assessment & Plan Note (Signed)
Hx of chronic edema BLE, takes Metolazone 2.49m qd, Spironolactone 245mqd. Bun/creat 23/1.26 eGFR 42 11/25/20

## 2020-12-09 NOTE — Assessment & Plan Note (Signed)
takes Sertraline $RemoveBeforeDE'100mg'[id number]$  qd.

## 2020-12-09 NOTE — Assessment & Plan Note (Signed)
takes Flonase, Zyrtec.

## 2020-12-09 NOTE — Assessment & Plan Note (Signed)
no occurrence for over a year, takes Quetiapine 12.5mg  qd, MRI showed atrophy

## 2020-12-09 NOTE — Assessment & Plan Note (Signed)
high functional, [place], takes Memantine $RemoveBeforeD'10mg'[id number]$  bid

## 2020-12-09 NOTE — Assessment & Plan Note (Signed)
Prediabetes, Hgb a1c. 6.1 11/25/20

## 2020-12-09 NOTE — Assessment & Plan Note (Signed)
Vit B12, supplemented with Vit B12, Hgb 13.3 11/25/20

## 2020-12-09 NOTE — Progress Notes (Signed)
Location:   clinic [facility]   Place of Service:    Provider: Marlana Latus NP  Code Status: DNR Goals of Care: [place] Advanced Directives 08/05/2020  Does Patient Have a Medical Advance Directive? Yes  Type of Advance Directive Living will;Out of facility DNR (pink MOST or yellow form)  Does patient want to make changes to medical advance directive? No - Patient declined  Copy of [place] in Chart? -  Would patient like information on creating a medical advance directive? -  Pre-existing out of facility DNR order (yellow form or pink MOST form) Yellow form placed in chart (order not valid for inpatient use);Pink MOST form placed in chart (order not valid for inpatient use)     No chief complaint on file.   HPI: Patient is a 87 y.o. female seen today for medical management of chronic diseases.     Hx of chronic edema BLE, takes Metolazone 2.15m qd, Spironolactone 269mqd. Bun/creat 23/1.26 eGFR 42 11/25/20             Allergic rhinitis, takes Flonase, Zyrtec.             GERD, takes Omeprazole 202mid, prn Zofran             Hyperlipidemia, LDL 71 04/01/20, , started Zocor             Hypothyroidism, takes Synthroid 137m62md. TSH 4.59 11/25/20             OSA, CPAP             Visual hallucination, no occurrence for over a year, takes Quetiapine 12.5mg 36m MRI showed atrophy             Depression/anxiety, takes Sertraline 100mg 73m            CAD ASA 81mg q66m           Vit B12, supplemented with Vit B12, Hgb 13.3 11/25/20             Dementia, high functional, [place], takes Memantine 10mg bi7m          Prediabetes, Hgb a1c. 6.1 11/25/20  Past Medical History:  Diagnosis Date   Altered mental status    ARTHRITIS, KNEES, BILATERAL    Breast CA ([facility]) [facility] s/p R lumpectomy and XRT   CHF (congestive heart failure) ([facility])    DEPRESSION    DJD (degenerative joint disease) of knee    GERD    HYPERLIPIDEMIA    HYPERTENSION    HYPOTHYROIDISM    postsurgical   Hypoxia     MIGRAINE HEADACHE    Morbid obesity ([facility])    OSA (obstructive sleep apnea) 01/17/2011 dx   Oxygen dependent    Urge incontinence    UTI (lower urinary tract infection) 12/2014    Past Surgical History:  Procedure Laterality Date   ABDOMINAL HYSTERECTOMY     ADENOIDECTOMY     BREAST BIOPSY  2000   BREAST LUMPECTOMY  2000   CATARACT EXTRACTION     CHOLECYSTECTOMY  1995   CYSTOSCOPY/URETEROSCOPY/HOLMIUM LASER Right 06/28/2015   Procedure: CYSTOSCOPY RIGHT RETROGRAD RIGHT URETEROSCOPY/HOLMIUM LASER WITH RIGHT STENT PLACEMENT;  Surgeon: TheodoreAlexis Frockocation: [facility];  Service: Urology;  Laterality: Right;   PARATHYROIDECTOMY     2-3 removed   REPLACEMENT TOTAL KNEE BILATERAL  1999, 2005   Right leg femur repaired     THYROIDECTOMY  TONSILLECTOMY     TUBAL LIGATION      Allergies  Allergen Reactions   Atorvastatin Nausea Only   Azithromycin Itching   Beta Adrenergic Blockers Other (See Comments)    Depression    Ciprofloxacin Other (See Comments)    Edgy and very jumpy     Codeine Nausea And Vomiting   Darvon [Propoxyphene] Nausea And Vomiting   Epinephrine Other (See Comments)    Rapid pulse, sweats   Klonopin [Clonazepam] Other (See Comments)    Makes her feel very suicidal    Oxycodone Other (See Comments)    Patient felt like it altered her mental status "Crazy" . Hallucinations later as well.   Paxil [Paroxetine Hydrochloride] Other (See Comments)    Severe depression    Prednisone Other (See Comments)    "makes me feel really bad"   Propoxyphene Hcl Nausea And Vomiting   Tape Itching   Torsemide Other (See Comments)    Patient stated that she doesn't recall the reaction   Tramadol Other (See Comments)    Feels "jittery"   Vicodin [Hydrocodone-Acetaminophen] Other (See Comments)    Hallucinations   Meloxicam Diarrhea   Other Rash    Polyester sheets "cause me a rash"   Vancomycin Rash    Localized rash related to infusion rate    Allergies as of  12/09/2020       Reactions   Atorvastatin Nausea Only   Azithromycin Itching   Beta Adrenergic Blockers Other (See Comments)   Depression   Ciprofloxacin Other (See Comments)   Edgy and very jumpy    Codeine Nausea And Vomiting   Darvon [propoxyphene] Nausea And Vomiting   Epinephrine Other (See Comments)   Rapid pulse, sweats   Klonopin [clonazepam] Other (See Comments)   Makes her feel very suicidal    Oxycodone Other (See Comments)   Patient felt like it altered her mental status "Crazy" . Hallucinations later as well.   Paxil [paroxetine Hydrochloride] Other (See Comments)   Severe depression    Prednisone Other (See Comments)   "makes me feel really bad"   Propoxyphene Hcl Nausea And Vomiting   Tape Itching   Torsemide Other (See Comments)   Patient stated that she doesn't recall the reaction   Tramadol Other (See Comments)   Feels "jittery"   Vicodin [hydrocodone-acetaminophen] Other (See Comments)   Hallucinations   Meloxicam Diarrhea   Other Rash   Polyester sheets "cause me a rash"   Vancomycin Rash   Localized rash related to infusion rate        Medication List        Accurate as of December 09, 2020  4:00 PM. If you have any questions, ask your nurse or doctor.          acetaminophen 500 MG tablet Commonly known as: TYLENOL Take 250 mg by mouth every 6 (six) hours as needed.   aspirin 81 MG tablet Take 81 mg by mouth daily.   calcium citrate-vitamin D 315-200 MG-UNIT tablet Commonly known as: CITRACAL+D Take 1 tablet by mouth daily. 292m tablet daily   cyanocobalamin 2000 MCG tablet Take 2,000 mcg by mouth daily.   FIBER-CAPS PO Take 2 capsules by mouth daily.   fluticasone 50 MCG/ACT nasal spray Commonly known as: FLONASE Place 2 sprays into both nostrils daily.   GenTeal Tears 0.1-0.2-0.3 % Soln Apply 1 drop to eye. Both eyes   loperamide 2 MG capsule Commonly known as: IMODIUM Take 1 capsule (2 mg  total) by mouth daily as needed for  diarrhea or loose stools.   melatonin 5 MG Tabs Take 1 tablet (5 mg total) by mouth at bedtime.   memantine 10 MG tablet Commonly known as: NAMENDA TAKE 1 TABLET BY MOUTH TWICE DAILY.   metolazone 2.5 MG tablet Commonly known as: ZAROXOLYN TAKE 1 TABLET FIVE DAYS A WEEK, TAKE 2 TABLETS ON MONDAY AND FRIDAY   omeprazole 20 MG capsule Commonly known as: PRILOSEC TAKE 1 CAPSULE TWICE DAILY BEFORE MEALS   ondansetron 4 MG tablet Commonly known as: ZOFRAN TAKE (1) TABLET BY MOUTH EVERY SIX HOURS AS NEEDED FOR NAUSEA. What changed:  how much to take how to take this when to take this reasons to take this additional instructions   OXYGEN Inhale 2 L into the lungs continuous. KEEP O2 SATS >= 90%. Every Shift   potassium chloride 10 MEQ CR capsule Commonly known as: MICRO-K TAKE 4 CAPSULES DAILY.   QUEtiapine 25 MG tablet Commonly known as: SEROQUEL TAKE 1/2 TABLET AT BEDTIME.   saccharomyces boulardii 250 MG capsule Commonly known as: FLORASTOR Take 1 capsule (250 mg total) by mouth daily.   sertraline 100 MG tablet Commonly known as: ZOLOFT TAKE ONE TABLET AT BEDTIME.   spironolactone 25 MG tablet Commonly known as: ALDACTONE TAKE 1 TABLET ONCE DAILY.   Synthroid 137 MCG tablet Generic drug: levothyroxine TAKE 1 TABLET ONCE DAILY BEFORE BREAKFAST.        Review of Systems:  Review of Systems  Health Maintenance  Topic Date Due   URINE MICROALBUMIN  Never done   FOOT EXAM  06/19/2020   OPHTHALMOLOGY EXAM  07/20/2020   INFLUENZA VACCINE  01/17/2021   HEMOGLOBIN A1C  05/27/2021   TETANUS/TDAP  11/13/2028   COVID-19 Vaccine  Completed   PNA vac Low Risk Adult  Completed   Zoster Vaccines- Shingrix  Completed   HPV VACCINES  Aged Out   DEXA SCAN  Discontinued    Physical Exam: There were no vitals filed for this visit. There is no height or weight on file to calculate BMI. Physical Exam  Labs reviewed: Basic Metabolic Panel: Recent Labs     12/25/19 0700 04/01/20 0000 11/25/20 0740  NA 140 140 142  K 4.1 4.1 4.1  CL 104 101 101  CO2 29 31 32  GLUCOSE 100* 78 91  BUN _0 CREATININE 0.93* 0.98* 1.16*  CALCIUM 8.6 8.5* 8.7  MG  --  1.5  --   TSH  --   --  4.59*   Liver Function Tests: Recent Labs    04/01/20 0000 11/25/20 0740  AST 13 12  ALT 8 7  BILITOT 0.6 0.4  PROT 6.1 6.2   No results for input(s): LIPASE, AMYLASE in the last 8760 hours. No results for input(s): AMMONIA in the last 8760 hours. CBC: Recent Labs    12/25/19 0700 04/01/20 0000 11/25/20 0740  WBC 6.7 7.1 6.7  NEUTROABS 4,462 4,665 4,456  HGB 13.2 13.1 13.3  HCT 40.1 40.6 41.2  MCV 86.8 86.9 87.7  PLT 133* 124* 116*   Lipid Panel: Recent Labs    12/25/19 0700 04/01/20 0000  CHOL 196 145  HDL 51 56  LDLCALC 124* 71  TRIG 106 95  CHOLHDL 3.8 2.6   Lab Results  Component Value Date   HGBA1C 6.1 (H) 11/25/2020    Procedures since last visit: No results found.  Assessment/Plan  Prediabetes Prediabetes, Hgb a1c. 6.1 11/25/20  Vascular  dementia ([place]) high functional, [place], takes Memantine 74m bid  Vitamin B12 deficiency Vit B12, supplemented with Vit B12, Hgb 13.3 11/25/20  Depression with anxiety takes Sertraline 107mqd.  Visual hallucinations no occurrence for over a year, takes Quetiapine 12.48m68md, MRI showed atrophy  Hypothyroidism (acquired) takes Synthroid 137m94md. TSH 4.59 11/25/20  Hyperlipidemia LDL goal <70 LDL 71 04/01/20, , started Zocor  Acid reflux takes Omeprazole 20mg61m, prn Zofran  Allergic rhinitis takes Flonase, Zyrtec.  Lymphedema Hx of chronic edema BLE, takes Metolazone 2.48mg q42mSpironolactone 248mg q87mun/creat 23/1.26 eGFR 42 11/25/20   Labs/tests ordered:  * No order type specified * Next appt:  Visit date not found  This encounter was created in error - please disregard.

## 2020-12-09 NOTE — Assessment & Plan Note (Signed)
takes Synthroid 127mcg qd. TSH 4.59 11/25/20

## 2020-12-09 NOTE — Assessment & Plan Note (Signed)
takes Omeprazole $RemoveBeforeDE'20mg'[id number]$  bid, prn Zofran

## 2020-12-09 NOTE — Assessment & Plan Note (Signed)
LDL 71 04/01/20, , started Zocor

## 2020-12-14 DIAGNOSIS — F4320 Adjustment disorder, unspecified: Secondary | ICD-10-CM | POA: Diagnosis not present

## 2020-12-21 DIAGNOSIS — F4320 Adjustment disorder, unspecified: Secondary | ICD-10-CM | POA: Diagnosis not present

## 2020-12-22 ENCOUNTER — Other Ambulatory Visit: Payer: Self-pay

## 2020-12-22 ENCOUNTER — Encounter: Payer: Self-pay | Admitting: Family Medicine

## 2020-12-22 ENCOUNTER — Non-Acute Institutional Stay (INDEPENDENT_AMBULATORY_CARE_PROVIDER_SITE_OTHER): Payer: Medicare Other | Admitting: Family Medicine

## 2020-12-22 VITALS — BP 128/88 | HR 90 | Temp 97.8°F

## 2020-12-22 DIAGNOSIS — E785 Hyperlipidemia, unspecified: Secondary | ICD-10-CM | POA: Diagnosis not present

## 2020-12-22 DIAGNOSIS — E662 Morbid (severe) obesity with alveolar hypoventilation: Secondary | ICD-10-CM | POA: Diagnosis not present

## 2020-12-22 DIAGNOSIS — I10 Essential (primary) hypertension: Secondary | ICD-10-CM | POA: Diagnosis not present

## 2020-12-22 NOTE — Progress Notes (Signed)
Provider:  Alain Honey, MD  Careteam: Patient Care Team: Virgie Dad, MD as PCP - General (Internal Medicine) Minus Breeding, MD as PCP - Cardiology (Cardiology) Azucena Fallen, MD as Consulting Physician (Obstetrics and Gynecology) Chesley Mires, MD as Consulting Physician (Pulmonary Disease) Mast, Man X, NP as Nurse Practitioner (Internal Medicine) Christin Fudge, MD (Inactive) as Consulting Physician (Surgery)  PLACE OF SERVICE:  [place]  Advanced Directive information    Allergies  Allergen Reactions   Atorvastatin Nausea Only   Azithromycin Itching   Beta Adrenergic Blockers Other (See Comments)    Depression    Ciprofloxacin Other (See Comments)    Edgy and very jumpy     Codeine Nausea And Vomiting   Darvon [Propoxyphene] Nausea And Vomiting   Epinephrine Other (See Comments)    Rapid pulse, sweats   Klonopin [Clonazepam] Other (See Comments)    Makes her feel very suicidal    Oxycodone Other (See Comments)    Patient felt like it altered her mental status "Crazy" . Hallucinations later as well.   Paxil [Paroxetine Hydrochloride] Other (See Comments)    Severe depression    Prednisone Other (See Comments)    "makes me feel really bad"   Propoxyphene Hcl Nausea And Vomiting   Tape Itching   Torsemide Other (See Comments)    Patient stated that she doesn't recall the reaction   Tramadol Other (See Comments)    Feels "jittery"   Vicodin [Hydrocodone-Acetaminophen] Other (See Comments)    Hallucinations   Meloxicam Diarrhea   Other Rash    Polyester sheets "cause me a rash"   Vancomycin Rash    Localized rash related to infusion rate    No chief complaint on file.    HPI: Patient is a 87 y.o. female patient was seen here 2 weeks ago and returns today for reasons that are unclear.  I do not see reason in reviewing last provider note patient is not aware of reasons we took the opportunity to review most recent labs which include renal function  and thyroid.  Although TSH is slightly elevated I think this is more chemical and clinical and would not change her thyroid supplement. She has a history of high functioning cognitive decline.  She takes Namenda 10 mg twice a day.  She easily remembers what she ate for supper last night knows a day and date and in general and are office visit I do not see any obvious signs of cognitive decline. She takes spironolactone as well as metolazone for lymphedema and is pleased with how that helps to keep edema down.  Sleep she sleeps well at night taking Seroquel 12.5 mg. She does complain of some postnasal drip and cough.  She feels like this is related to some allergies  Review of Systems:  Review of Systems  Respiratory:  Positive for cough.   Genitourinary: Negative.   Musculoskeletal: Negative.   Neurological: Negative.   Psychiatric/Behavioral: Negative.    All other systems reviewed and are negative.  Past Medical History:  Diagnosis Date   Altered mental status    ARTHRITIS, KNEES, BILATERAL    Breast CA ([place]) 2000   s/p R lumpectomy and XRT   CHF (congestive heart failure) (HCC)    DEPRESSION    DJD (degenerative joint disease) of knee    GERD    HYPERLIPIDEMIA    HYPERTENSION    HYPOTHYROIDISM    postsurgical   Hypoxia    MIGRAINE HEADACHE  Morbid obesity ([place])    OSA (obstructive sleep apnea) 01/17/2011 dx   Oxygen dependent    Urge incontinence    UTI (lower urinary tract infection) 12/2014   Past Surgical History:  Procedure Laterality Date   ABDOMINAL HYSTERECTOMY     ADENOIDECTOMY     BREAST BIOPSY  2000   BREAST LUMPECTOMY  2000   CATARACT EXTRACTION     CHOLECYSTECTOMY  1995   CYSTOSCOPY/URETEROSCOPY/HOLMIUM LASER Right 06/28/2015   Procedure: CYSTOSCOPY RIGHT RETROGRAD RIGHT URETEROSCOPY/HOLMIUM LASER WITH RIGHT STENT PLACEMENT;  Surgeon: Alexis Frock, MD;  Location: [facility];  Service: Urology;  Laterality: Right;   PARATHYROIDECTOMY     2-3 removed    REPLACEMENT TOTAL KNEE BILATERAL  1999, 2005   Right leg femur repaired     THYROIDECTOMY     TONSILLECTOMY     TUBAL LIGATION     Social History:   reports that she quit smoking about 46 years ago. Her smoking use included cigarettes. She has a 0.50 pack-year smoking history. She has never used smokeless tobacco. She reports that she does not drink alcohol and does not use drugs.  Family History  Problem Relation Age of Onset   Heart disease Mother    Heart disease Father    Emphysema Sister    Coronary artery disease Neg Hx     Medications: Patient's Medications  New Prescriptions   No medications on file  Previous Medications   ACETAMINOPHEN (TYLENOL) 500 MG TABLET    Take 250 mg by mouth every 6 (six) hours as needed.   ARTIFICIAL TEAR SOLUTION (GENTEAL TEARS) 0.1-0.2-0.3 % SOLN    Apply 1 drop to eye. Both eyes   ASPIRIN 81 MG TABLET    Take 81 mg by mouth daily.   CALCIUM CITRATE-VITAMIN D (CITRACAL+D) 315-200 MG-UNIT TABLET    Take 1 tablet by mouth daily. $RemoveBefo'200mg'ySNdenmvRYI$  tablet daily   CALCIUM POLYCARBOPHIL (FIBER-CAPS PO)    Take 2 capsules by mouth daily.   CYANOCOBALAMIN 2000 MCG TABLET    Take 2,000 mcg by mouth daily.   FLUTICASONE (FLONASE) 50 MCG/ACT NASAL SPRAY    Place 2 sprays into both nostrils daily.   LOPERAMIDE (IMODIUM) 2 MG CAPSULE    Take 1 capsule (2 mg total) by mouth daily as needed for diarrhea or loose stools.   MELATONIN 5 MG TABS    Take 1 tablet (5 mg total) by mouth at bedtime.   MEMANTINE (NAMENDA) 10 MG TABLET    TAKE 1 TABLET BY MOUTH TWICE DAILY.   METOLAZONE (ZAROXOLYN) 2.5 MG TABLET    TAKE 1 TABLET FIVE DAYS A WEEK, TAKE 2 TABLETS ON MONDAY AND FRIDAY   OMEPRAZOLE (PRILOSEC) 20 MG CAPSULE    TAKE 1 CAPSULE TWICE DAILY BEFORE MEALS   ONDANSETRON (ZOFRAN) 4 MG TABLET    TAKE (1) TABLET BY MOUTH EVERY SIX HOURS AS NEEDED FOR NAUSEA.   OXYGEN    Inhale 2 L into the lungs continuous. KEEP O2 SATS >= 90%. Every Shift   POTASSIUM CHLORIDE (MICRO-K) 10 MEQ  CR CAPSULE    TAKE 4 CAPSULES DAILY.   QUETIAPINE (SEROQUEL) 25 MG TABLET    TAKE 1/2 TABLET AT BEDTIME.   SACCHAROMYCES BOULARDII (FLORASTOR) 250 MG CAPSULE    Take 1 capsule (250 mg total) by mouth daily.   SERTRALINE (ZOLOFT) 100 MG TABLET    TAKE ONE TABLET AT BEDTIME.   SPIRONOLACTONE (ALDACTONE) 25 MG TABLET    TAKE 1 TABLET ONCE DAILY.  SYNTHROID 137 MCG TABLET    TAKE 1 TABLET ONCE DAILY BEFORE BREAKFAST.  Modified Medications   No medications on file  Discontinued Medications   No medications on file    Physical Exam:  There were no vitals filed for this visit. There is no height or weight on file to calculate BMI. Wt Readings from Last 3 Encounters:  08/06/20 269 lb 12.8 oz (122.4 kg)  05/06/20 283 lb 6.4 oz (128.5 kg)  02/06/20 272 lb 9.6 oz (123.7 kg)    Physical Exam Vitals and nursing note reviewed.  Constitutional:      Appearance: Normal appearance. She is obese.  HENT:     Nose: Nose normal.  Cardiovascular:     Rate and Rhythm: Normal rate and regular rhythm.  Pulmonary:     Effort: Pulmonary effort is normal.     Breath sounds: Normal breath sounds.  Neurological:     General: No focal deficit present.     Mental Status: She is alert and oriented to person, place, and time.    Labs reviewed: Basic Metabolic Panel: Recent Labs    12/25/19 0700 04/01/20 0000 11/25/20 0740  NA 140 140 142  K 4.1 4.1 4.1  CL 104 101 101  CO2 29 31 32  GLUCOSE 100* 78 91  BUN $Re'20 13 23  'PjG$ CREATININE 0.93* 0.98* 1.16*  CALCIUM 8.6 8.5* 8.7  MG  --  1.5  --   TSH  --   --  4.59*   Liver Function Tests: Recent Labs    04/01/20 0000 11/25/20 0740  AST 13 12  ALT 8 7  BILITOT 0.6 0.4  PROT 6.1 6.2   No results for input(s): LIPASE, AMYLASE in the last 8760 hours. No results for input(s): AMMONIA in the last 8760 hours. CBC: Recent Labs    12/25/19 0700 04/01/20 0000 11/25/20 0740  WBC 6.7 7.1 6.7  NEUTROABS 4,462 4,665 4,456  HGB 13.2 13.1 13.3  HCT  40.1 40.6 41.2  MCV 86.8 86.9 87.7  PLT 133* 124* 116*   Lipid Panel: Recent Labs    12/25/19 0700 04/01/20 0000  CHOL 196 145  HDL 51 56  LDLCALC 124* 71  TRIG 106 95  CHOLHDL 3.8 2.6   TSH: Recent Labs    11/25/20 0740  TSH 4.59*   A1C: Lab Results  Component Value Date   HGBA1C 6.1 (H) 11/25/2020     Assessment/Plan   1. Essential hypertension Blood pressure controlled today at 128/88 on spironolactone and metolazone  2. Obesity hypoventilation syndrome ([place]) Patient has pulse oximetry and monitors her O2 levels.  She described an episode yesterday where she felt short of breath and pulse ox was in the 80s.  She has as needed oxygen but she wears it every night.  3. Hyperlipidemia LDL goal <70 Lipids were last measured in October 2021.  LDL at that time was [place], [place] [phone]

## 2020-12-27 DIAGNOSIS — F4320 Adjustment disorder, unspecified: Secondary | ICD-10-CM | POA: Diagnosis not present

## 2020-12-28 DIAGNOSIS — D2221 Melanocytic nevi of right ear and external auricular canal: Secondary | ICD-10-CM | POA: Diagnosis not present

## 2020-12-28 DIAGNOSIS — Z85828 Personal history of other malignant neoplasm of skin: Secondary | ICD-10-CM | POA: Diagnosis not present

## 2020-12-28 DIAGNOSIS — L570 Actinic keratosis: Secondary | ICD-10-CM | POA: Diagnosis not present

## 2020-12-31 ENCOUNTER — Other Ambulatory Visit: Payer: Self-pay | Admitting: Internal Medicine

## 2021-01-01 ENCOUNTER — Emergency Department (HOSPITAL_COMMUNITY)
Admission: EM | Admit: 2021-01-01 | Discharge: 2021-01-01 | Disposition: A | Discharge status: Home / Self Care | Payer: Medicare Other | Attending: Emergency Medicine | Admitting: Emergency Medicine

## 2021-01-01 ENCOUNTER — Other Ambulatory Visit: Payer: Self-pay

## 2021-01-01 DIAGNOSIS — I10 Essential (primary) hypertension: Secondary | ICD-10-CM | POA: Diagnosis not present

## 2021-01-01 DIAGNOSIS — R457 State of emotional shock and stress, unspecified: Secondary | ICD-10-CM | POA: Diagnosis not present

## 2021-01-01 DIAGNOSIS — Z046 Encounter for general psychiatric examination, requested by authority: Secondary | ICD-10-CM | POA: Diagnosis present

## 2021-01-01 DIAGNOSIS — Z79899 Other long term (current) drug therapy: Secondary | ICD-10-CM | POA: Diagnosis not present

## 2021-01-01 DIAGNOSIS — E890 Postprocedural hypothyroidism: Secondary | ICD-10-CM | POA: Diagnosis not present

## 2021-01-01 DIAGNOSIS — Z853 Personal history of malignant neoplasm of breast: Secondary | ICD-10-CM | POA: Insufficient documentation

## 2021-01-01 DIAGNOSIS — Z87891 Personal history of nicotine dependence: Secondary | ICD-10-CM | POA: Diagnosis not present

## 2021-01-01 DIAGNOSIS — R5381 Other malaise: Secondary | ICD-10-CM | POA: Diagnosis not present

## 2021-01-01 DIAGNOSIS — I509 Heart failure, unspecified: Secondary | ICD-10-CM | POA: Insufficient documentation

## 2021-01-01 DIAGNOSIS — Z743 Need for continuous supervision: Secondary | ICD-10-CM | POA: Diagnosis not present

## 2021-01-01 DIAGNOSIS — F29 Unspecified psychosis not due to a substance or known physiological condition: Secondary | ICD-10-CM | POA: Diagnosis not present

## 2021-01-01 DIAGNOSIS — R2689 Other abnormalities of gait and mobility: Secondary | ICD-10-CM | POA: Diagnosis not present

## 2021-01-01 DIAGNOSIS — R531 Weakness: Secondary | ICD-10-CM | POA: Diagnosis not present

## 2021-01-01 DIAGNOSIS — R45851 Suicidal ideations: Secondary | ICD-10-CM | POA: Insufficient documentation

## 2021-01-01 DIAGNOSIS — N390 Urinary tract infection, site not specified: Secondary | ICD-10-CM

## 2021-01-01 DIAGNOSIS — I110 Hypertensive heart disease with heart failure: Secondary | ICD-10-CM | POA: Insufficient documentation

## 2021-01-01 DIAGNOSIS — Z7982 Long term (current) use of aspirin: Secondary | ICD-10-CM | POA: Insufficient documentation

## 2021-01-01 DIAGNOSIS — Z96653 Presence of artificial knee joint, bilateral: Secondary | ICD-10-CM | POA: Insufficient documentation

## 2021-01-01 DIAGNOSIS — B9689 Other specified bacterial agents as the cause of diseases classified elsewhere: Secondary | ICD-10-CM | POA: Insufficient documentation

## 2021-01-01 LAB — RAPID URINE DRUG SCREEN, HOSP PERFORMED
Amphetamines: NOT DETECTED
Barbiturates: NOT DETECTED
Benzodiazepines: NOT DETECTED
Cocaine: NOT DETECTED
Opiates: NOT DETECTED
Tetrahydrocannabinol: NOT DETECTED

## 2021-01-01 LAB — BASIC METABOLIC PANEL
Anion gap: 10 (ref 5–15)
BUN: 19 mg/dL (ref 8–23)
CO2: 29 mmol/L (ref 22–32)
Calcium: 8.7 mg/dL — ABNORMAL LOW (ref 8.9–10.3)
Chloride: 100 mmol/L (ref 98–111)
Creatinine, Ser: 1.11 mg/dL — ABNORMAL HIGH (ref 0.44–1.00)
GFR, Estimated: 48 mL/min — ABNORMAL LOW (ref >60)
Glucose, Bld: 107 mg/dL — ABNORMAL HIGH (ref 70–99)
Potassium: 3.9 mmol/L (ref 3.5–5.1)
Sodium: 139 mmol/L (ref 135–145)

## 2021-01-01 LAB — CBC WITH DIFFERENTIAL/PLATELET
Abs Immature Granulocytes: 0.02 10*3/uL (ref 0.00–0.07)
Basophils Absolute: 0.1 10*3/uL (ref 0.0–0.1)
Basophils Relative: 1 %
Eosinophils Absolute: 0.2 10*3/uL (ref 0.0–0.5)
Eosinophils Relative: 3 %
HCT: 44.7 % (ref 36.0–46.0)
Hemoglobin: 13.9 g/dL (ref 12.0–15.0)
Immature Granulocytes: 0 %
Lymphocytes Relative: 16 %
Lymphs Abs: 1.3 10*3/uL (ref 0.7–4.0)
MCH: 27.9 pg (ref 26.0–34.0)
MCHC: 31.1 g/dL (ref 30.0–36.0)
MCV: 89.8 fL (ref 80.0–100.0)
Monocytes Absolute: 0.7 10*3/uL (ref 0.1–1.0)
Monocytes Relative: 9 %
Neutro Abs: 5.7 10*3/uL (ref 1.7–7.7)
Neutrophils Relative %: 71 %
Platelets: 129 10*3/uL — ABNORMAL LOW (ref 150–400)
RBC: 4.98 MIL/uL (ref 3.87–5.11)
RDW: 17.6 % — ABNORMAL HIGH (ref 11.5–15.5)
WBC: 8 10*3/uL (ref 4.0–10.5)
nRBC: 0 % (ref 0.0–0.2)

## 2021-01-01 LAB — ETHANOL: Alcohol, Ethyl (B): <10 mg/dL (ref <10)

## 2021-01-01 LAB — ACETAMINOPHEN LEVEL: Acetaminophen (Tylenol), Serum: <10 ug/mL — ABNORMAL LOW (ref 10–30)

## 2021-01-01 LAB — SALICYLATE LEVEL: Salicylate Lvl: <7 mg/dL — ABNORMAL LOW (ref 7.0–30.0)

## 2021-01-01 LAB — URINALYSIS, ROUTINE W REFLEX MICROSCOPIC
Bilirubin Urine: NEGATIVE
Glucose, UA: NEGATIVE mg/dL
Hgb urine dipstick: NEGATIVE
Ketones, ur: NEGATIVE mg/dL
Nitrite: POSITIVE — AB
Protein, ur: NEGATIVE mg/dL
Specific Gravity, Urine: 1.008 (ref 1.005–1.030)
pH: 5 (ref 5.0–8.0)

## 2021-01-01 MED ORDER — FOSFOMYCIN TROMETHAMINE 3 G PO PACK
3.0000 g | PACK | Freq: Once | ORAL | Status: AC
  Administered 2021-01-01: 3 g via ORAL
  Filled 2021-01-01: qty 3

## 2021-01-01 NOTE — ED Notes (Signed)
Pt was dressed out in burgundy scrubs and yellow socks. The pt has two bags of belongings (one white pt belongings bag and a colorful stripped personal bag) in the cabinet labeled, "Patient belongings 16-22 and Venita Sheffield." The pt also has a cane with pt label on it located under the desk where 19-22 nurse is stationed. Pt has jewelry located in pink dentures cup inside the white patient belongings bag. Pt has a watch, hoop earrings, and rings in this container.

## 2021-01-01 NOTE — ED Provider Notes (Addendum)
[facility] Provider Note: Lowella Dell, MD, FACEP  CSN: [id number] MRN: [id number] ARRIVAL: 01/01/21 at 0111 ROOM: WA21/WA21   CHIEF COMPLAINT  Suicidal   HISTORY OF PRESENT ILLNESS  01/01/21 1:30 AM Tamara Morales is a 87 y.o. female who was sent from her nursing home for thoughts of suicide.  These thoughts came on about 9 PM yesterday evening.  They came on suddenly and the patient does not know why they came on.  There was no event or change in her life to bring these thoughts on.  She now states the thoughts have passed and she is no longer thinking of suicide.  She did not construct a plan nor did she attempt to hurt her self.  She has no acute somatic complaint.  She states she is chronically short of breath and is on oxygen 2 L by nasal cannula.   Past Medical History:  Diagnosis Date   Altered mental status    ARTHRITIS, KNEES, BILATERAL    Breast CA (HCC) 2000   s/p R lumpectomy and XRT   CHF (congestive heart failure) (HCC)    DEPRESSION    DJD (degenerative joint disease) of knee    GERD    HYPERLIPIDEMIA    HYPERTENSION    HYPOTHYROIDISM    postsurgical   Hypoxia    MIGRAINE HEADACHE    Morbid obesity (HCC)    OSA (obstructive sleep apnea) 01/17/2011 dx   Oxygen dependent    Urge incontinence    UTI (lower urinary tract infection) 12/2014    Past Surgical History:  Procedure Laterality Date   ABDOMINAL HYSTERECTOMY     ADENOIDECTOMY     BREAST BIOPSY  2000   BREAST LUMPECTOMY  2000   CATARACT EXTRACTION     CHOLECYSTECTOMY  1995   CYSTOSCOPY/URETEROSCOPY/HOLMIUM LASER Right 06/28/2015   Procedure: CYSTOSCOPY RIGHT RETROGRAD RIGHT URETEROSCOPY/HOLMIUM LASER WITH RIGHT STENT PLACEMENT;  Surgeon: Sebastian Ache, MD;  Location: [facility];  Service: Urology;  Laterality: Right;   PARATHYROIDECTOMY     2-3 removed   REPLACEMENT TOTAL KNEE BILATERAL  1999, 2005   Right leg femur repaired     THYROIDECTOMY     TONSILLECTOMY     TUBAL LIGATION       Family History  Problem Relation Age of Onset   Heart disease Mother    Heart disease Father    Emphysema Sister    Coronary artery disease Neg Hx     Social History   Tobacco Use   Smoking status: Former    Packs/day: 0.25    Years: 2.00    Pack years: 0.50    Types: Cigarettes    Quit date: 06/19/1974    Years since quitting: 46.5   Smokeless tobacco: Never   Tobacco comments:    Quit in late 20's  Vaping Use   Vaping Use: Never used  Substance Use Topics   Alcohol use: No    Alcohol/week: 0.0 standard drinks   Drug use: No    Prior to Admission medications   Medication Sig Start Date End Date Taking? Authorizing Provider  acetaminophen (TYLENOL) 500 MG tablet Take 250 mg by mouth every 6 (six) hours as needed.    [provider]  Artificial Tear Solution (GENTEAL TEARS) 0.1-0.2-0.3 % SOLN Apply 1 drop to eye. Both eyes    [provider]  aspirin 81 MG tablet Take 81 mg by mouth daily.    [provider]  calcium citrate-vitamin  D (CITRACAL+D) 315-200 MG-UNIT tablet Take 1 tablet by mouth daily. $RemoveBefo'200mg'LSDFsdqalSt$  tablet daily 02/11/19   Virgie Dad, MD  Calcium Polycarbophil (FIBER-CAPS PO) Take 2 capsules by mouth daily.    [provider]  cyanocobalamin 2000 MCG tablet Take 2,000 mcg by mouth daily.    [provider]  fluticasone (FLONASE) 50 MCG/ACT nasal spray Place 2 sprays into both nostrils daily. 08/08/20   Virgie Dad, MD  loperamide (IMODIUM) 2 MG capsule Take 1 capsule (2 mg total) by mouth daily as needed for diarrhea or loose stools. 02/11/19   Virgie Dad, MD  Melatonin 5 MG TABS Take 1 tablet (5 mg total) by mouth at bedtime. 02/11/19   Virgie Dad, MD  memantine (NAMENDA) 10 MG tablet TAKE 1 TABLET BY MOUTH TWICE DAILY. 11/05/20   Virgie Dad, MD  metolazone (ZAROXOLYN) 2.5 MG tablet TAKE 1 TABLET FIVE DAYS A WEEK, TAKE 2 TABLETS ON MONDAY AND FRIDAY 11/05/20   Virgie Dad, MD  omeprazole (PRILOSEC)  20 MG capsule TAKE 1 CAPSULE TWICE DAILY BEFORE MEALS 08/16/20   Virgie Dad, MD  ondansetron (ZOFRAN) 4 MG tablet TAKE (1) TABLET BY MOUTH EVERY SIX HOURS AS NEEDED FOR NAUSEA. Patient taking differently: Take 4 mg by mouth every 6 (six) hours as needed for nausea or vomiting. 02/11/19   Virgie Dad, MD  OXYGEN Inhale 2 L into the lungs continuous. KEEP O2 SATS >= 90%. Every Shift    [provider]  potassium chloride (MICRO-K) 10 MEQ CR capsule TAKE 4 CAPSULES DAILY. 08/16/20   Virgie Dad, MD  QUEtiapine (SEROQUEL) 25 MG tablet TAKE 1/2 TABLET AT BEDTIME. 08/16/20   Virgie Dad, MD  saccharomyces boulardii (FLORASTOR) 250 MG capsule Take 1 capsule (250 mg total) by mouth daily. 02/11/19   Virgie Dad, MD  sertraline (ZOLOFT) 100 MG tablet TAKE ONE TABLET AT BEDTIME. 07/26/20   Virgie Dad, MD  spironolactone (ALDACTONE) 25 MG tablet TAKE 1 TABLET ONCE DAILY. 08/16/20   Virgie Dad, MD  SYNTHROID 137 MCG tablet TAKE 1 TABLET ONCE DAILY BEFORE BREAKFAST. 12/31/20   Virgie Dad, MD    Allergies Atorvastatin, Azithromycin, Beta adrenergic blockers, Ciprofloxacin, Codeine, Darvon [propoxyphene], Epinephrine, Klonopin [clonazepam], Oxycodone, Paxil [paroxetine hydrochloride], Prednisone, Propoxyphene hcl, Tape, Torsemide, Tramadol, Vicodin [hydrocodone-acetaminophen], Meloxicam, Other, and Vancomycin   REVIEW OF SYSTEMS  Negative except as noted here or in the History of Present Illness.   PHYSICAL EXAMINATION  Initial Vital Signs Blood pressure (!) 153/120, pulse 90, temperature 97.7 F (36.5 C), temperature source Oral, resp. rate 16, SpO2 97 %.  Examination General: Well-developed, well-nourished female in no acute distress; appearance consistent with age of record HENT: normocephalic; atraumatic Eyes: pupils equal, round and reactive to light; extraocular muscles intact; bilateral pseudophakia and arcus senilis Neck: supple Heart: regular rate and  rhythm Lungs: clear to auscultation bilaterally Abdomen: soft; nondistended; nontender; bowel sounds present Extremities: Arthritic changes; +1 edema of lower legs with chronic appearing skin changes Neurologic: Awake, alert and oriented; motor function intact in all extremities and symmetric; no facial droop Skin: Warm and dry Psychiatric: Normal mood and affect   RESULTS  Summary of this visit's results, reviewed and interpreted by myself:   EKG Interpretation  Date/Time:  Saturday January 01 2021 02:07:00 EDT Ventricular Rate:  91 PR Interval:  173 QRS Duration: 97 QT Interval:  370 QTC Calculation: 456 R Axis:   73 Text Interpretation: Sinus rhythm  Atrial premature complex Probable left atrial enlargement otherwise no significant changes Confirmed by Jayvan Mcshan, Jenny Reichmann [phone]) on 01/01/2021 4:01:47 AM       Laboratory Studies: Results for orders placed or performed during the hospital encounter of 01/01/21 (from the past 24 hour(s))  CBC with Differential/Platelet     Status: Abnormal   Collection Time: 01/01/21  1:57 AM  Result Value Ref Range   WBC 8.0 4.0 - 10.5 K/uL   RBC 4.98 3.87 - 5.11 MIL/uL   Hemoglobin 13.9 12.0 - 15.0 g/dL   HCT 44.7 36.0 - 46.0 %   MCV 89.8 80.0 - 100.0 fL   MCH 27.9 26.0 - 34.0 pg   MCHC 31.1 30.0 - 36.0 g/dL   RDW 17.6 (H) 11.5 - 15.5 %   Platelets 129 (L) 150 - 400 K/uL   nRBC 0.0 0.0 - 0.2 %   Neutrophils Relative % 71 %   Neutro Abs 5.7 1.7 - 7.7 K/uL   Lymphocytes Relative 16 %   Lymphs Abs 1.3 0.7 - 4.0 K/uL   Monocytes Relative 9 %   Monocytes Absolute 0.7 0.1 - 1.0 K/uL   Eosinophils Relative 3 %   Eosinophils Absolute 0.2 0.0 - 0.5 K/uL   Basophils Relative 1 %   Basophils Absolute 0.1 0.0 - 0.1 K/uL   Immature Granulocytes 0 %   Abs Immature Granulocytes 0.02 0.00 - 0.07 K/uL  Ethanol     Status: None   Collection Time: 01/01/21  1:57 AM  Result Value Ref Range   Alcohol, Ethyl (B) <17 <49 mg/dL  Salicylate level     Status:  Abnormal   Collection Time: 01/01/21  1:57 AM  Result Value Ref Range   Salicylate Lvl <4.4 (L) 7.0 - 30.0 mg/dL  Acetaminophen level     Status: Abnormal   Collection Time: 01/01/21  1:57 AM  Result Value Ref Range   Acetaminophen (Tylenol), Serum <10 (L) 10 - 30 ug/mL  Basic metabolic panel     Status: Abnormal   Collection Time: 01/01/21  1:57 AM  Result Value Ref Range   Sodium 139 135 - 145 mmol/L   Potassium 3.9 3.5 - 5.1 mmol/L   Chloride 100 98 - 111 mmol/L   CO2 29 22 - 32 mmol/L   Glucose, Bld 107 (H) 70 - 99 mg/dL   BUN 19 8 - 23 mg/dL   Creatinine, Ser 1.11 (H) 0.44 - 1.00 mg/dL   Calcium 8.7 (L) 8.9 - 10.3 mg/dL   GFR, Estimated 48 (L) >60 mL/min   Anion gap 10 5 - 15  Rapid urine drug screen (hospital performed)     Status: None   Collection Time: 01/01/21  3:00 AM  Result Value Ref Range   Opiates NONE DETECTED NONE DETECTED   Cocaine NONE DETECTED NONE DETECTED   Benzodiazepines NONE DETECTED NONE DETECTED   Amphetamines NONE DETECTED NONE DETECTED   Tetrahydrocannabinol NONE DETECTED NONE DETECTED   Barbiturates NONE DETECTED NONE DETECTED  Urinalysis, Routine w reflex microscopic Urine, Catheterized     Status: Abnormal   Collection Time: 01/01/21  3:00 AM  Result Value Ref Range   Color, Urine YELLOW YELLOW   APPearance HAZY (A) CLEAR   Specific Gravity, Urine 1.008 1.005 - 1.030   pH 5.0 5.0 - 8.0   Glucose, UA NEGATIVE NEGATIVE mg/dL   Hgb urine dipstick NEGATIVE NEGATIVE   Bilirubin Urine NEGATIVE NEGATIVE   Ketones, ur NEGATIVE NEGATIVE mg/dL   Protein, ur NEGATIVE  NEGATIVE mg/dL   Nitrite POSITIVE (A) NEGATIVE   Leukocytes,Ua LARGE (A) NEGATIVE   RBC / HPF 0-5 0 - 5 RBC/hpf   WBC, UA 21-50 0 - 5 WBC/hpf   Bacteria, UA MANY (A) NONE SEEN   Squamous Epithelial / LPF 0-5 0 - 5   WBC Clumps PRESENT    Imaging Studies: No results found.  ED COURSE and MDM  Nursing notes, initial and subsequent vitals signs, including pulse oximetry, reviewed  and interpreted by myself.  Vitals:   01/01/21 0129 01/01/21 0130 01/01/21 0230 01/01/21 0315  BP: (!) 166/70  (!) 161/106 (!) 164/83  Pulse: 90  87 84  Resp: $Remo'16  16 18  'epnEH$ Temp: 97.7 F (36.5 C)     TempSrc: Oral     SpO2: 97% 98% 96% 96%   Medications  fosfomycin (MONUROL) packet 3 g (has no administration in time range)   3:58 AM Patient medically cleared for psychiatric evaluation apart from urinary tract infection.  We will treat the urinary tract infection with a dose of fosfomycin.  5:06 AM Patient has been assessed by TTS and they feel she is safe for discharge back home.  She is continuing to deny suicidal ideation at the present time.  PROCEDURES  Procedures   ED DIAGNOSES     ICD-10-CM   1. Suicidal ideation  R45.851     2. Lower urinary tract infection  N39.0          Eleanor Dimichele, MD 01/01/21 0359    Shanon Rosser, MD 01/01/21 0402    Shanon Rosser, MD 01/01/21 [phone]

## 2021-01-01 NOTE — BH Assessment (Signed)
Comprehensive Clinical Assessment (CCA) Note  01/01/2021 Tamara Morales [id number]  Discharge Disposition: Quintella Reichert, NP, reviewed pt's chart and information and determined pt can be psych cleared. This information was relayed to pt's team at [facility].  The patient demonstrates the following risk factors for suicide: Chronic risk factors for suicide include: N/A. Acute risk factors for suicide include: N/A. Protective factors for this patient include: positive social support, positive therapeutic relationship, coping skills, hope for the future, and life satisfaction. Considering these factors, the overall suicide risk at this point appears to be low. Patient is appropriate for outpatient follow up.  Therefore, a tele-sitter is recommended for suicide precautions.  [place] ED from 01/01/2021 in [place] DEPT  C-SSRS RISK CATEGORY Low Risk     Chief Complaint:  Chief Complaint  Patient presents with   Suicidal   Visit Diagnosis: UTI: Urinary Tract Infection; No MH diagnosis  CCA Screening, Triage and Referral (STR) Tamara Morales is an 87 year old patient who was brought to the [facility] due to expressing sudden SI to a nurse supervisor at the nursing home in which she lives. Pt states she's never experienced thoughts such as this, but states, "I had an episode early last night that I felt like I was going to kill myself. When I felt like killing myself, the idea came quickly and it really scared me. I needed to find somebody to tell so they could help me. I found one of our nurse supervisors; I talked to him."  Pt endorses experiencing SI tonight, though she states she's no longer experiencing it and she's never experienced it in the past. Pt denies she's ever attempted to kill herself or that she has a plan to kill herself. Pt denies HI, AVH, NSSIB, access to guns/weapons, engagement with the legal system, or SA.  Pt states she's lived at her nursing  home for "going on 12 years" and that it's "wonderful." She shares she has 5 children and many grandchildren and that they frequently visit.  Pt is oriented x5. Her recent/remote memory is intact. Pt was cooperative throughout the assessment process. Pt's insight, judgement, and impulse control is good at this time.   Patient Reported Information How did you hear about [place]? Other (Comment) (EDP)  What Is the Reason for Your Visit/Call Today? Pt states, "I had an episode early last night that I felt like I was going to kill myself."  How Long Has This Been Causing You Problems? <Week  What Do You Feel Would Help You the Most Today? Treatment for Depression or other mood problem   Have You Recently Had Any Thoughts About Hurting Yourself? Yes  Are You Planning to Commit Suicide/Harm Yourself At This time? No   Have you Recently Had Thoughts About [place]? No  Are You Planning to Harm Someone at This Time? No  Explanation: No data recorded  Have You Used Any Alcohol or Drugs in the Past 24 Hours? No  How Long Ago Did You Use Drugs or Alcohol? No data recorded What Did You Use and How Much? No data recorded  Do You Currently Have a Therapist/Psychiatrist? No  Name of Therapist/Psychiatrist: No data recorded  Have You Been Recently Discharged From Any Office Practice or Programs? No  Explanation of Discharge From Practice/Program: No data recorded    CCA Screening Triage Referral Assessment Type of Contact: Tele-Assessment  Telemedicine Service Delivery: Telemedicine service delivery: This service was provided via telemedicine using a 2-way, interactive  audio and video technology  Is this Initial or Reassessment? Initial Assessment  Date Telepsych consult ordered in [facility]:  01/01/21  Time Telepsych consult ordered in [facility]:  0145  Location of Assessment: [facility] ED  Provider Location: [facility] Assessment Services   Collateral Involvement: None at this  time   Does Patient Have a Automotive engineer Guardian? No data recorded Name and Contact of Legal Guardian: No data recorded If Minor and Not Living with Parent(s), Who has Custody? N/A  Is CPS involved or ever been involved? Never  Is APS involved or ever been involved? Never   Patient Determined To Be At Risk for Harm To Self or Others Based on Review of Patient Reported Information or Presenting Complaint? No  Method: No data recorded Availability of Means: No data recorded Intent: No data recorded Notification Required: No data recorded Additional Information for Danger to Others Potential: No data recorded Additional Comments for Danger to Others Potential: No data recorded Are There Guns or Other Weapons in Your Home? No data recorded Types of Guns/Weapons: No data recorded Are These Weapons Safely Secured?                            No data recorded Who Could Verify You Are Able To Have These Secured: No data recorded Do You Have any Outstanding Charges, Pending Court Dates, Parole/Probation? No data recorded Contacted To Inform of Risk of Harm To Self or Others: -- (N/A)    Does Patient Present under Involuntary Commitment? No  IVC Papers Initial File Date: No data recorded  [place] of Residence: Tamara Morales   Patient Currently Receiving the Following Services: Skilled Nursing Facility   Determination of Need: No data recorded  Options For Referral: No data recorded    CCA Biopsychosocial Patient Reported Schizophrenia/Schizoaffective Diagnosis in Past: No   Strengths: Pt was able to identify the need to speak to someone for help, which she was able to do.   Mental Health Symptoms Depression:   None   Duration of Depressive symptoms:    Mania:   None   Anxiety:    None   Psychosis:   None   Duration of Psychotic symptoms:    Trauma:   None   Obsessions:   None   Compulsions:   None   Inattention:   None    Hyperactivity/Impulsivity:   None   Oppositional/Defiant Behaviors:   None   Emotional Irregularity:   Mood lability   Other Mood/Personality Symptoms:   None noted    Mental Status Exam Appearance and self-care  Stature:   Small   Weight:   Thin   Clothing:   -- (Pt is dressed in a hospital gown)   Grooming:   Normal   Cosmetic use:   None   Posture/gait:   Normal   Motor activity:   Slowed   Sensorium  Attention:   Normal   Concentration:   Normal   Orientation:   X5   Recall/memory:   Normal   Affect and Mood  Affect:   Appropriate   Mood:   Other (Comment) (Concerned)   Relating  Eye contact:   Normal   Facial expression:   Constricted   Attitude toward examiner:   Cooperative   Thought and Language  Speech flow:  Slow   Thought content:   Appropriate to Mood and Circumstances   Preoccupation:   None   Hallucinations:  None   Organization:  No data recorded  [employer] of Knowledge:   Average   Intelligence:   Average   Abstraction:   Normal   Judgement:   Good   Reality Testing:   Realistic   Insight:   Good   Decision Making:   Normal   Social Functioning  Social Maturity:   Responsible   Social Judgement:   Normal   Stress  Stressors:   -- (None noted)   Coping Ability:   Normal   Skill Deficits:   None   Supports:   Family; Friends/Service system     Religion: Religion/Spirituality Are You A Religious Person?:  (Not assessed) How Might This Affect Treatment?: Not assessed  Leisure/Recreation: Leisure / Recreation Do You Have Hobbies?:  (Not assessed)  Exercise/Diet: Exercise/Diet Do You Exercise?:  (Not assessed) Have You Gained or Lost A Significant Amount of Weight in the Past Six Months?:  (Not assessed) Do You Follow a Special Diet?:  (Not assessed) Do You Have Any Trouble Sleeping?:  (Not assessed)   CCA Employment/Education Employment/Work  Situation: Employment / Work Situation Employment Situation: Retired Social research officer, government has Been Impacted by Current Illness:  (N/A) Has Patient ever Been in Passenger transport manager?:  (Not assessed)  Education: Education Is Patient Currently Attending School?: No Last Grade Completed:  (Not assessed) Did Physicist, medical?:  (Not assessed) Did You Have An Individualized Education Program (IIEP):  (Not assessed) Did You Have Any Difficulty At School?:  (Not assessed) Patient's Education Has Been Impacted by Current Illness:  (Not assessed)   CCA Family/Childhood History Family and Relationship History: Family history Marital status: Widowed Widowed, when?: Not assessed Does patient have children?: Yes How many children?: 5 How is patient's relationship with their children?: Pt states she has a great relationship with her children.  Childhood History:  Childhood History By whom was/is the patient raised?:  (Not assessed) Did patient suffer any verbal/emotional/physical/sexual abuse as a child?:  (Not assessed) Did patient suffer from severe childhood neglect?:  (Not assessed) Has patient ever been sexually abused/assaulted/raped as an adolescent or adult?:  (Not assessed) Was the patient ever a victim of a crime or a disaster?:  (Not assessed) Witnessed domestic violence?:  (Not assessed) Has patient been affected by domestic violence as an adult?:  (Not assessed)  Child/Adolescent Assessment:     CCA Substance Use Alcohol/Drug Use: Alcohol / Drug Use Pain Medications: See MAR Prescriptions: See MAR Over the Counter: See MAR History of alcohol / drug use?: No history of alcohol / drug abuse Longest period of sobriety (when/how long): Pt denies Negative Consequences of Use:  (N/A) Withdrawal Symptoms:  (N/A)                         ASAM's:  Six Dimensions of Multidimensional Assessment  Dimension 1:  Acute Intoxication and/or Withdrawal Potential:      Dimension 2:   Biomedical Conditions and Complications:      Dimension 3:  Emotional, Behavioral, or Cognitive Conditions and Complications:     Dimension 4:  Readiness to Change:     Dimension 5:  Relapse, Continued use, or Continued Problem Potential:     Dimension 6:  Recovery/Living Environment:     ASAM Severity Score:    ASAM Recommended Level of Treatment: ASAM Recommended Level of Treatment:  (N/A)   Substance use Disorder (SUD) Substance Use Disorder (SUD)  Checklist Symptoms of Substance Use:  (N/A)  Recommendations for Services/Supports/Treatments: Recommendations for Services/Supports/Treatments Recommendations For Services/Supports/Treatments: Other (Comment) (Psych clear; no services recommended)  Discharge Disposition: Quintella Reichert, NP, reviewed pt's chart and information and determined pt can be psych cleared. This information was relayed to pt's team at [place].  DSM5 Diagnoses: Patient Active Problem List   Diagnosis Date Noted   Rotator cuff tendinitis, left 05/06/2020   Rectal bleed 10/09/2019   Vascular dementia ([place]) 10/02/2019   Rash in adult 10/02/2019   Visual hallucinations 12/31/2018   Weight loss 12/10/2018   Allergic rhinitis 12/06/2018   Left ankle pain 12/06/2018   Hypocalcemia 12/02/2018   Fall 11/15/2018   Gait abnormality 11/15/2018   Hallucination, visual 11/14/2018   Confusion 11/10/2018   Generalized weakness 11/06/2018   Chronic diastolic CHF (congestive heart failure) ([place]) 10/29/2018   Vitamin B12 deficiency 05/23/2018   Anxiety 04/23/2018   Chronic respiratory failure with hypoxia ([place]) 04/23/2018   Acute on chronic diastolic (congestive) heart failure ([place]) 03/14/2018   Diabetes mellitus type 2 in obese ([place]) 03/12/2018   Lymphedema 11/20/2017   Counseling regarding advanced care planning and goals of care 09/20/2017   Hypomagnesemia 07/17/2017   Prediabetes 07/03/2017   Hyperlipidemia LDL goal <70 07/03/2017   Post-nasal drip 06/27/2017    CKD (chronic kidney disease) stage 3, GFR 30-59 ml/min (HCC) 05/22/2017   Bilateral dry eyes 04/20/2017   Hemorrhoids 04/09/2017   Chronic constipation 03/30/2017   Weight gain 12/07/2016   Neck pain on left side 10/05/2016   Chronic left shoulder pain 08/10/2016   Numbness and tingling of right leg 04/06/2016   IBS (irritable bowel syndrome) 11/18/2015   Hypokalemia 07/07/2015   Acid reflux 07/05/2015   UTI (urinary tract infection) 07/02/2015   Ureterolithiasis    Depression with anxiety 06/27/2015   History of breast cancer 06/27/2015   OSA (obstructive sleep apnea)    Obesity hypoventilation syndrome ([place]) 11/27/2014   Diarrhea 08/21/2013   Dyspnea 03/13/2013   Cough 03/05/2010   Morbid obesity ([place]) 10/04/2009   Hypothyroidism (acquired) 08/05/2009   Essential hypertension 08/05/2009     Referrals to Alternative Service(s): Referred to Alternative Service(s):   Place:   Date:   Time:    Referred to Alternative Service(s):   Place:   Date:   Time:    Referred to Alternative Service(s):   Place:   Date:   Time:    Referred to Alternative Service(s):   Place:   Date:   Time:     Dannielle Burn, LMFT

## 2021-01-01 NOTE — BH Assessment (Signed)
Clinician made contact w/ pt's team at 0200 to inquire about pt's ability to participate in her [place] Assessment as well as to determine if she has yet been medically cleared. Pt's nurse, Loree Fee RN, replied at [facility] stating pt's lab work had been sent out and that, hopefully, the results will return soon and she can be medically cleared.

## 2021-01-01 NOTE — ED Triage Notes (Signed)
Pt BIB by EMS from [facility] with c/o overwhelming feelings around 10 o'clock last night. Per pt, around 10 o'clock last night she got very overwhelmed and felt like she couldn't live anymore. Pt found nurse supervisor and relayed her feelings. Pt did not have a plan or intent to harm herself. At this time pt does not have any feelings to harm herself.

## 2021-01-03 LAB — URINE CULTURE: Culture: >=100000 — AB

## 2021-01-04 ENCOUNTER — Telehealth: Payer: Self-pay | Admitting: *Deleted

## 2021-01-04 NOTE — Telephone Encounter (Signed)
Post ED Visit - Positive Culture Follow-up  Culture report reviewed by antimicrobial stewardship pharmacist: [place] Team []  Elenor Quinones, Pharm.D. []  Heide Guile, Pharm.D., BCPS AQ-ID []  Parks Neptune, Pharm.D., BCPS []  Alycia Rossetti, Pharm.D., BCPS []  [place], Pharm.D., BCPS, AAHIVP []  Legrand Como, Pharm.D., BCPS, AAHIVP []  Salome Arnt, PharmD, BCPS []  Johnnette Gourd, PharmD, BCPS []  Hughes Better, PharmD, BCPS []  Leeroy Cha, PharmD []  Laqueta Linden, PharmD, BCPS []  Albertina Parr, PharmD  [place] Team []  Leodis Sias, PharmD []  Lindell Spar, PharmD []  Royetta Asal, PharmD []  Graylin Shiver, Rph []  Rema Fendt) Glennon Mac, PharmD []  Arlyn Dunning, PharmD []  Netta Cedars, PharmD []  Dia Sitter, PharmD []  Leone Haven, PharmD []  Gretta Arab, PharmD []  Theodis Shove, PharmD []  Peggyann Juba, PharmD []  Reuel Boom, PharmD   Positive urine culture Treated with Fosfomycin, organism sensitive to the same and no further patient follow-up is required at this time.  Willadean Carol, PharmD  Harlon Flor Talley 01/04/2021, 9:17 AM

## 2021-01-05 ENCOUNTER — Observation Stay (HOSPITAL_COMMUNITY): Payer: Medicare Other

## 2021-01-05 ENCOUNTER — Other Ambulatory Visit: Payer: Self-pay

## 2021-01-05 ENCOUNTER — Inpatient Hospital Stay (HOSPITAL_COMMUNITY)
Admission: EM | Admit: 2021-01-05 | Discharge: 2021-01-10 | DRG: 202 | Disposition: A | Discharge status: Skilled Nursing Facility | Payer: Medicare Other | Source: Skilled Nursing Facility | Attending: Family Medicine | Admitting: Family Medicine

## 2021-01-05 ENCOUNTER — Encounter (HOSPITAL_COMMUNITY): Payer: Self-pay

## 2021-01-05 ENCOUNTER — Emergency Department (HOSPITAL_COMMUNITY): Payer: Medicare Other

## 2021-01-05 DIAGNOSIS — E1122 Type 2 diabetes mellitus with diabetic chronic kidney disease: Secondary | ICD-10-CM | POA: Diagnosis present

## 2021-01-05 DIAGNOSIS — R918 Other nonspecific abnormal finding of lung field: Secondary | ICD-10-CM | POA: Diagnosis not present

## 2021-01-05 DIAGNOSIS — Z91048 Other nonmedicinal substance allergy status: Secondary | ICD-10-CM

## 2021-01-05 DIAGNOSIS — R32 Unspecified urinary incontinence: Secondary | ICD-10-CM | POA: Diagnosis present

## 2021-01-05 DIAGNOSIS — R0602 Shortness of breath: Principal | ICD-10-CM

## 2021-01-05 DIAGNOSIS — S0231XA Fracture of orbital floor, right side, initial encounter for closed fracture: Secondary | ICD-10-CM | POA: Diagnosis not present

## 2021-01-05 DIAGNOSIS — E662 Morbid (severe) obesity with alveolar hypoventilation: Secondary | ICD-10-CM | POA: Diagnosis not present

## 2021-01-05 DIAGNOSIS — Z7982 Long term (current) use of aspirin: Secondary | ICD-10-CM

## 2021-01-05 DIAGNOSIS — J189 Pneumonia, unspecified organism: Secondary | ICD-10-CM | POA: Diagnosis not present

## 2021-01-05 DIAGNOSIS — J90 Pleural effusion, not elsewhere classified: Secondary | ICD-10-CM

## 2021-01-05 DIAGNOSIS — E669 Obesity, unspecified: Secondary | ICD-10-CM | POA: Diagnosis present

## 2021-01-05 DIAGNOSIS — M419 Scoliosis, unspecified: Secondary | ICD-10-CM | POA: Diagnosis not present

## 2021-01-05 DIAGNOSIS — J9811 Atelectasis: Secondary | ICD-10-CM | POA: Diagnosis not present

## 2021-01-05 DIAGNOSIS — D696 Thrombocytopenia, unspecified: Secondary | ICD-10-CM | POA: Diagnosis not present

## 2021-01-05 DIAGNOSIS — E892 Postprocedural hypoparathyroidism: Secondary | ICD-10-CM | POA: Diagnosis present

## 2021-01-05 DIAGNOSIS — G4733 Obstructive sleep apnea (adult) (pediatric): Secondary | ICD-10-CM | POA: Diagnosis present

## 2021-01-05 DIAGNOSIS — E890 Postprocedural hypothyroidism: Secondary | ICD-10-CM | POA: Diagnosis present

## 2021-01-05 DIAGNOSIS — Z66 Do not resuscitate: Secondary | ICD-10-CM | POA: Diagnosis present

## 2021-01-05 DIAGNOSIS — N1831 Chronic kidney disease, stage 3a: Secondary | ICD-10-CM | POA: Diagnosis present

## 2021-01-05 DIAGNOSIS — Z79899 Other long term (current) drug therapy: Secondary | ICD-10-CM

## 2021-01-05 DIAGNOSIS — I5032 Chronic diastolic (congestive) heart failure: Secondary | ICD-10-CM | POA: Diagnosis present

## 2021-01-05 DIAGNOSIS — Z6841 Body Mass Index (BMI) 40.0 and over, adult: Secondary | ICD-10-CM

## 2021-01-05 DIAGNOSIS — Z79890 Hormone replacement therapy (postmenopausal): Secondary | ICD-10-CM

## 2021-01-05 DIAGNOSIS — J309 Allergic rhinitis, unspecified: Secondary | ICD-10-CM | POA: Diagnosis present

## 2021-01-05 DIAGNOSIS — J9621 Acute and chronic respiratory failure with hypoxia: Secondary | ICD-10-CM | POA: Diagnosis not present

## 2021-01-05 DIAGNOSIS — C801 Malignant (primary) neoplasm, unspecified: Secondary | ICD-10-CM | POA: Diagnosis not present

## 2021-01-05 DIAGNOSIS — Z9981 Dependence on supplemental oxygen: Secondary | ICD-10-CM

## 2021-01-05 DIAGNOSIS — E1169 Type 2 diabetes mellitus with other specified complication: Secondary | ICD-10-CM | POA: Diagnosis present

## 2021-01-05 DIAGNOSIS — Z888 Allergy status to other drugs, medicaments and biological substances status: Secondary | ICD-10-CM

## 2021-01-05 DIAGNOSIS — N390 Urinary tract infection, site not specified: Secondary | ICD-10-CM | POA: Diagnosis present

## 2021-01-05 DIAGNOSIS — R531 Weakness: Secondary | ICD-10-CM | POA: Diagnosis not present

## 2021-01-05 DIAGNOSIS — R590 Localized enlarged lymph nodes: Secondary | ICD-10-CM | POA: Diagnosis present

## 2021-01-05 DIAGNOSIS — I130 Hypertensive heart and chronic kidney disease with heart failure and stage 1 through stage 4 chronic kidney disease, or unspecified chronic kidney disease: Secondary | ICD-10-CM | POA: Diagnosis present

## 2021-01-05 DIAGNOSIS — Z87891 Personal history of nicotine dependence: Secondary | ICD-10-CM

## 2021-01-05 DIAGNOSIS — Z885 Allergy status to narcotic agent status: Secondary | ICD-10-CM

## 2021-01-05 DIAGNOSIS — I700 Atherosclerosis of aorta: Secondary | ICD-10-CM | POA: Diagnosis not present

## 2021-01-05 DIAGNOSIS — K219 Gastro-esophageal reflux disease without esophagitis: Secondary | ICD-10-CM | POA: Diagnosis present

## 2021-01-05 DIAGNOSIS — Z993 Dependence on wheelchair: Secondary | ICD-10-CM

## 2021-01-05 DIAGNOSIS — K5730 Diverticulosis of large intestine without perforation or abscess without bleeding: Secondary | ICD-10-CM | POA: Diagnosis not present

## 2021-01-05 DIAGNOSIS — Z9049 Acquired absence of other specified parts of digestive tract: Secondary | ICD-10-CM

## 2021-01-05 DIAGNOSIS — Z881 Allergy status to other antibiotic agents status: Secondary | ICD-10-CM

## 2021-01-05 DIAGNOSIS — F0390 Unspecified dementia without behavioral disturbance: Secondary | ICD-10-CM | POA: Diagnosis present

## 2021-01-05 DIAGNOSIS — F0150 Vascular dementia without behavioral disturbance: Secondary | ICD-10-CM | POA: Diagnosis present

## 2021-01-05 DIAGNOSIS — Z20822 Contact with and (suspected) exposure to covid-19: Secondary | ICD-10-CM | POA: Diagnosis not present

## 2021-01-05 DIAGNOSIS — I10 Essential (primary) hypertension: Secondary | ICD-10-CM | POA: Diagnosis present

## 2021-01-05 DIAGNOSIS — R5381 Other malaise: Secondary | ICD-10-CM | POA: Diagnosis not present

## 2021-01-05 DIAGNOSIS — E039 Hypothyroidism, unspecified: Secondary | ICD-10-CM | POA: Diagnosis present

## 2021-01-05 DIAGNOSIS — I672 Cerebral atherosclerosis: Secondary | ICD-10-CM | POA: Diagnosis not present

## 2021-01-05 DIAGNOSIS — E785 Hyperlipidemia, unspecified: Secondary | ICD-10-CM | POA: Diagnosis present

## 2021-01-05 DIAGNOSIS — Z96653 Presence of artificial knee joint, bilateral: Secondary | ICD-10-CM | POA: Diagnosis present

## 2021-01-05 DIAGNOSIS — J9809 Other diseases of bronchus, not elsewhere classified: Principal | ICD-10-CM | POA: Diagnosis present

## 2021-01-05 DIAGNOSIS — R0902 Hypoxemia: Secondary | ICD-10-CM | POA: Diagnosis not present

## 2021-01-05 DIAGNOSIS — F329 Major depressive disorder, single episode, unspecified: Secondary | ICD-10-CM | POA: Diagnosis present

## 2021-01-05 DIAGNOSIS — B9620 Unspecified Escherichia coli [E. coli] as the cause of diseases classified elsewhere: Secondary | ICD-10-CM | POA: Diagnosis present

## 2021-01-05 DIAGNOSIS — I890 Lymphedema, not elsewhere classified: Secondary | ICD-10-CM | POA: Diagnosis present

## 2021-01-05 DIAGNOSIS — Z8249 Family history of ischemic heart disease and other diseases of the circulatory system: Secondary | ICD-10-CM

## 2021-01-05 DIAGNOSIS — Z853 Personal history of malignant neoplasm of breast: Secondary | ICD-10-CM

## 2021-01-05 DIAGNOSIS — Z90710 Acquired absence of both cervix and uterus: Secondary | ICD-10-CM

## 2021-01-05 LAB — URINALYSIS, ROUTINE W REFLEX MICROSCOPIC
Bilirubin Urine: NEGATIVE
Glucose, UA: NEGATIVE mg/dL
Hgb urine dipstick: NEGATIVE
Ketones, ur: NEGATIVE mg/dL
Leukocytes,Ua: NEGATIVE
Nitrite: NEGATIVE
Protein, ur: NEGATIVE mg/dL
Specific Gravity, Urine: 1.009 (ref 1.005–1.030)
pH: 5 (ref 5.0–8.0)

## 2021-01-05 LAB — BASIC METABOLIC PANEL
Anion gap: 9 (ref 5–15)
BUN: 26 mg/dL — ABNORMAL HIGH (ref 8–23)
CO2: 33 mmol/L — ABNORMAL HIGH (ref 22–32)
Calcium: 9.1 mg/dL (ref 8.9–10.3)
Chloride: 98 mmol/L (ref 98–111)
Creatinine, Ser: 0.99 mg/dL (ref 0.44–1.00)
GFR, Estimated: 55 mL/min — ABNORMAL LOW (ref >60)
Glucose, Bld: 115 mg/dL — ABNORMAL HIGH (ref 70–99)
Potassium: 4.7 mmol/L (ref 3.5–5.1)
Sodium: 140 mmol/L (ref 135–145)

## 2021-01-05 LAB — CBC WITH DIFFERENTIAL/PLATELET
Abs Immature Granulocytes: 0.04 10*3/uL (ref 0.00–0.07)
Basophils Absolute: 0.1 10*3/uL (ref 0.0–0.1)
Basophils Relative: 1 %
Eosinophils Absolute: 0.2 10*3/uL (ref 0.0–0.5)
Eosinophils Relative: 3 %
HCT: 41.5 % (ref 36.0–46.0)
Hemoglobin: 12.9 g/dL (ref 12.0–15.0)
Immature Granulocytes: 1 %
Lymphocytes Relative: 14 %
Lymphs Abs: 1 10*3/uL (ref 0.7–4.0)
MCH: 28.5 pg (ref 26.0–34.0)
MCHC: 31.1 g/dL (ref 30.0–36.0)
MCV: 91.6 fL (ref 80.0–100.0)
Monocytes Absolute: 0.6 10*3/uL (ref 0.1–1.0)
Monocytes Relative: 9 %
Neutro Abs: 5.3 10*3/uL (ref 1.7–7.7)
Neutrophils Relative %: 72 %
Platelets: 118 10*3/uL — ABNORMAL LOW (ref 150–400)
RBC: 4.53 MIL/uL (ref 3.87–5.11)
RDW: 17.2 % — ABNORMAL HIGH (ref 11.5–15.5)
WBC: 7.3 10*3/uL (ref 4.0–10.5)
nRBC: 0 % (ref 0.0–0.2)

## 2021-01-05 LAB — RESP PANEL BY RT-PCR (FLU A&B, COVID) ARPGX2
Influenza A by PCR: NEGATIVE
Influenza B by PCR: NEGATIVE
SARS Coronavirus 2 by RT PCR: NEGATIVE

## 2021-01-05 MED ORDER — SERTRALINE HCL 100 MG PO TABS
100.0000 mg | ORAL_TABLET | Freq: Every day | ORAL | Status: DC
  Administered 2021-01-05 – 2021-01-09 (×5): 100 mg via ORAL
  Filled 2021-01-05 (×6): qty 1

## 2021-01-05 MED ORDER — PROCHLORPERAZINE EDISYLATE 10 MG/2ML IJ SOLN
10.0000 mg | Freq: Four times a day (QID) | INTRAMUSCULAR | Status: DC | PRN
  Administered 2021-01-05 – 2021-01-09 (×3): 10 mg via INTRAVENOUS
  Filled 2021-01-05 (×3): qty 2

## 2021-01-05 MED ORDER — METOLAZONE 2.5 MG PO TABS: 2.5000 mg | ORAL_TABLET | ORAL | Status: DC

## 2021-01-05 MED ORDER — QUETIAPINE FUMARATE 25 MG PO TABS
12.5000 mg | ORAL_TABLET | Freq: Every day | ORAL | Status: DC
  Administered 2021-01-05 – 2021-01-09 (×5): 12.5 mg via ORAL
  Filled 2021-01-05 (×5): qty 1

## 2021-01-05 MED ORDER — LEVOTHYROXINE SODIUM 25 MCG PO TABS
137.0000 ug | ORAL_TABLET | Freq: Every day | ORAL | Status: DC
  Administered 2021-01-06 – 2021-01-10 (×5): 137 ug via ORAL
  Filled 2021-01-05 (×5): qty 1

## 2021-01-05 MED ORDER — ACETAMINOPHEN 650 MG RE SUPP: 650.0000 mg | Freq: Four times a day (QID) | RECTAL | Status: DC | PRN

## 2021-01-05 MED ORDER — ASPIRIN EC 81 MG PO TBEC
81.0000 mg | DELAYED_RELEASE_TABLET | Freq: Every day | ORAL | Status: DC
  Administered 2021-01-05 – 2021-01-06 (×2): 81 mg via ORAL
  Filled 2021-01-05 (×2): qty 1

## 2021-01-05 MED ORDER — METOLAZONE 2.5 MG PO TABS
2.5000 mg | ORAL_TABLET | ORAL | Status: DC
  Administered 2021-01-06: 2.5 mg via ORAL
  Filled 2021-01-05: qty 1

## 2021-01-05 MED ORDER — ALUM & MAG HYDROXIDE-SIMETH 200-200-20 MG/5ML PO SUSP
30.0000 mL | Freq: Four times a day (QID) | ORAL | Status: DC | PRN
  Filled 2021-01-05: qty 30

## 2021-01-05 MED ORDER — MEMANTINE HCL 10 MG PO TABS
10.0000 mg | ORAL_TABLET | Freq: Two times a day (BID) | ORAL | Status: DC
  Administered 2021-01-05 – 2021-01-10 (×10): 10 mg via ORAL
  Filled 2021-01-05 (×10): qty 1

## 2021-01-05 MED ORDER — SPIRONOLACTONE 25 MG PO TABS
25.0000 mg | ORAL_TABLET | Freq: Every day | ORAL | Status: DC
  Administered 2021-01-06 – 2021-01-10 (×5): 25 mg via ORAL
  Filled 2021-01-05 (×5): qty 1

## 2021-01-05 MED ORDER — SODIUM CHLORIDE (PF) 0.9 % IJ SOLN
INTRAMUSCULAR | Status: AC
  Filled 2021-01-05: qty 50

## 2021-01-05 MED ORDER — ONDANSETRON 8 MG PO TBDP
8.0000 mg | ORAL_TABLET | Freq: Once | ORAL | Status: AC
Start: 2021-01-05 — End: 2021-01-05
  Administered 2021-01-05: 8 mg via ORAL
  Filled 2021-01-05: qty 1

## 2021-01-05 MED ORDER — MELATONIN 5 MG PO TABS
5.0000 mg | ORAL_TABLET | Freq: Every day | ORAL | Status: DC
  Administered 2021-01-05 – 2021-01-09 (×5): 5 mg via ORAL
  Filled 2021-01-05 (×5): qty 1

## 2021-01-05 MED ORDER — ACETAMINOPHEN 325 MG PO TABS: 650.0000 mg | ORAL_TABLET | Freq: Four times a day (QID) | ORAL | Status: DC | PRN

## 2021-01-05 MED ORDER — SACCHAROMYCES BOULARDII 250 MG PO CAPS
250.0000 mg | ORAL_CAPSULE | Freq: Every day | ORAL | Status: DC
  Administered 2021-01-05 – 2021-01-10 (×6): 250 mg via ORAL
  Filled 2021-01-05 (×6): qty 1

## 2021-01-05 MED ORDER — METOLAZONE 5 MG PO TABS: 5.0000 mg | ORAL_TABLET | ORAL | Status: DC

## 2021-01-05 MED ORDER — IOHEXOL 350 MG/ML SOLN
100.0000 mL | Freq: Once | INTRAVENOUS | Status: AC | PRN
  Administered 2021-01-05: 75 mL via INTRAVENOUS

## 2021-01-05 MED ORDER — LEVALBUTEROL HCL 0.63 MG/3ML IN NEBU
0.6300 mg | INHALATION_SOLUTION | Freq: Four times a day (QID) | RESPIRATORY_TRACT | Status: DC | PRN
  Administered 2021-01-09: 0.63 mg via RESPIRATORY_TRACT
  Filled 2021-01-05: qty 3

## 2021-01-05 MED ORDER — PHENAZOPYRIDINE HCL 100 MG PO TABS
100.0000 mg | ORAL_TABLET | Freq: Three times a day (TID) | ORAL | Status: AC
  Administered 2021-01-05 – 2021-01-07 (×6): 100 mg via ORAL
  Filled 2021-01-05 (×8): qty 1

## 2021-01-05 NOTE — ED Triage Notes (Signed)
Pt BIB EMS from [facility] with complaints of a UTI. Pt was noncompliant with her medications from her previous hospital visit with the same issue.   Vitals 98 hr 94% 3L/min oxygen dependent at home

## 2021-01-05 NOTE — H&P (Addendum)
History and Physical    Tamara Morales FKC:[id number] DOB: 12/28/1930 DOA: 01/05/2021  PCP: Virgie Dad, MD  Patient coming from: [place]  Chief Complaint: Increased urinary frequency  HPI: Tamara Morales is a 87 y.o. female with medical history significant of HLD, hypothyroidism, HTN. Presenting with increased urinary frequency. She reports that she's had increased urinary frequency and nausea for the last week to 10 days. She was seen in the ED on 7/16 and given an antibiotic for a UTI. She reports that she doesn't feel as if she is getting any better. "I just feel bad". She doesn't believe the abx worked. She also c/o dyspnea for the last week. There is no cough, but she has just had a general malaise over that time. She became concerned and came back to the ED for help. She denies any other aggravating or alleviating factors.   ED Course: UA was negative. She had a WNL WBC and no fevers. CXR showed a left chest haziness, so a CT chest was obtained. It should obstruction of left bronchus and collapse of LUL. TRH was called for admission.   Review of Systems: Review of systems is otherwise negative for all not mentioned in HPI.   PMHx Past Medical History:  Diagnosis Date   Altered mental status    ARTHRITIS, KNEES, BILATERAL    Breast CA ([place]) 2000   s/p R lumpectomy and XRT   CHF (congestive heart failure) (HCC)    DEPRESSION    DJD (degenerative joint disease) of knee    GERD    HYPERLIPIDEMIA    HYPERTENSION    HYPOTHYROIDISM    postsurgical   Hypoxia    MIGRAINE HEADACHE    Morbid obesity (HCC)    OSA (obstructive sleep apnea) 01/17/2011 dx   Oxygen dependent    Urge incontinence    UTI (lower urinary tract infection) 12/2014    PSHx Past Surgical History:  Procedure Laterality Date   ABDOMINAL HYSTERECTOMY     ADENOIDECTOMY     BREAST BIOPSY  2000   BREAST LUMPECTOMY  2000   CATARACT EXTRACTION     CHOLECYSTECTOMY  1995    CYSTOSCOPY/URETEROSCOPY/HOLMIUM LASER Right 06/28/2015   Procedure: CYSTOSCOPY RIGHT RETROGRAD RIGHT URETEROSCOPY/HOLMIUM LASER WITH RIGHT STENT PLACEMENT;  Surgeon: Alexis Frock, MD;  Location: [facility];  Service: Urology;  Laterality: Right;   PARATHYROIDECTOMY     2-3 removed   REPLACEMENT TOTAL KNEE BILATERAL  1999, 2005   Right leg femur repaired     THYROIDECTOMY     TONSILLECTOMY     TUBAL LIGATION      SocHx  reports that she quit smoking about 46 years ago. Her smoking use included cigarettes. She has a 0.50 pack-year smoking history. She has never used smokeless tobacco. She reports that she does not drink alcohol and does not use drugs.  Allergies  Allergen Reactions   Atorvastatin Nausea Only   Azithromycin Itching   Beta Adrenergic Blockers Other (See Comments)    Depression    Ciprofloxacin Other (See Comments)    Edgy and very jumpy     Codeine Nausea And Vomiting   Darvon [Propoxyphene] Nausea And Vomiting   Epinephrine Other (See Comments)    Rapid pulse, sweats   Klonopin [Clonazepam] Other (See Comments)    Makes her feel very suicidal    Oxycodone Other (See Comments)    Patient felt like it altered her mental status "Crazy" . Hallucinations later as well.  Paxil [Paroxetine Hydrochloride] Other (See Comments)    Severe depression    Prednisone Other (See Comments)    "makes me feel really bad"   Propoxyphene Hcl Nausea And Vomiting   Tape Itching   Torsemide Other (See Comments)    Patient stated that she doesn't recall the reaction   Tramadol Other (See Comments)    Feels "jittery"   Vicodin [Hydrocodone-Acetaminophen] Other (See Comments)    Hallucinations   Meloxicam Diarrhea   Other Rash    Polyester sheets "cause me a rash"   Vancomycin Rash    Localized rash related to infusion rate    FamHx Family History  Problem Relation Age of Onset   Heart disease Mother    Heart disease Father    Emphysema Sister    Coronary artery disease Neg  Hx     Prior to Admission medications   Medication Sig Start Date End Date Taking? Authorizing Provider  aspirin 81 MG tablet Take 81 mg by mouth daily.   Yes [provider]  calcium citrate-vitamin D (CITRACAL+D) 315-200 MG-UNIT tablet Take 1 tablet by mouth daily. $RemoveBefo'200mg'fZrQdtiFegp$  tablet daily 02/11/19  Yes Virgie Dad, MD  memantine (NAMENDA) 10 MG tablet TAKE 1 TABLET BY MOUTH TWICE DAILY. 11/05/20  Yes Virgie Dad, MD  metolazone (ZAROXOLYN) 2.5 MG tablet TAKE 1 TABLET FIVE DAYS A WEEK, TAKE 2 TABLETS ON MONDAY AND FRIDAY 11/05/20  Yes Virgie Dad, MD  omeprazole (PRILOSEC) 20 MG capsule TAKE 1 CAPSULE TWICE DAILY BEFORE MEALS 08/16/20  Yes Virgie Dad, MD  potassium chloride (MICRO-K) 10 MEQ CR capsule TAKE 4 CAPSULES DAILY. 08/16/20  Yes Virgie Dad, MD  QUEtiapine (SEROQUEL) 25 MG tablet TAKE 1/2 TABLET AT BEDTIME. 08/16/20  Yes Virgie Dad, MD  sertraline (ZOLOFT) 100 MG tablet TAKE ONE TABLET AT BEDTIME. 07/26/20  Yes Virgie Dad, MD  spironolactone (ALDACTONE) 25 MG tablet TAKE 1 TABLET ONCE DAILY. 08/16/20  Yes Virgie Dad, MD  SYNTHROID 137 MCG tablet TAKE 1 TABLET ONCE DAILY BEFORE BREAKFAST. 12/31/20  Yes Virgie Dad, MD  acetaminophen (TYLENOL) 500 MG tablet Take 250 mg by mouth every 6 (six) hours as needed.    [provider]  Artificial Tear Solution (GENTEAL TEARS) 0.1-0.2-0.3 % SOLN Apply 1 drop to eye. Both eyes    [provider]  Calcium Polycarbophil (FIBER-CAPS PO) Take 2 capsules by mouth daily.    [provider]  cyanocobalamin 2000 MCG tablet Take 2,000 mcg by mouth daily.    [provider]  fluticasone (FLONASE) 50 MCG/ACT nasal spray Place 2 sprays into both nostrils daily. 08/08/20   Virgie Dad, MD  loperamide (IMODIUM) 2 MG capsule Take 1 capsule (2 mg total) by mouth daily as needed for diarrhea or loose stools. 02/11/19   Virgie Dad, MD  Melatonin 5 MG TABS Take 1 tablet (5 mg total) by  mouth at bedtime. 02/11/19   Virgie Dad, MD  ondansetron (ZOFRAN) 4 MG tablet TAKE (1) TABLET BY MOUTH EVERY SIX HOURS AS NEEDED FOR NAUSEA. Patient taking differently: Take 4 mg by mouth every 6 (six) hours as needed for nausea or vomiting. 02/11/19   Virgie Dad, MD  OXYGEN Inhale 2 L into the lungs continuous. KEEP O2 SATS >= 90%. Every Shift    [provider]  saccharomyces boulardii (FLORASTOR) 250 MG capsule Take 1 capsule (250 mg total) by mouth daily. 02/11/19   Veleta Miners  L, MD    Physical Exam: Vitals:   01/05/21 0356 01/05/21 0700  BP: (!) 143/81 (!) 142/80  Pulse: 95 85  Resp: 18 (!) 32  Temp: 98.3 F (36.8 C)   TempSrc: Oral   SpO2: 95% 100%    General: 87 y.o. female resting in bed in NAD Eyes: PERRL, normal sclera ENMT: Nares patent w/o discharge, orophaynx clear, dentition normal, ears w/o discharge/lesions/ulcers Neck: Supple, trachea midline Cardiovascular: RRR, +S1, S2, no m/g/r, equal pulses throughout Respiratory: decreased breath sounds on LUL, no w/r/r, normal WOB on 2L [place] GI: BS+, NDNT, no masses noted, no organomegaly noted MSK: No c/c; BLE 1+ edema Skin: No rashes, bruises, ulcerations noted Neuro: A&O x 3, no focal deficits Psyc: Appropriate interaction and affect, calm/cooperative  Labs on Admission: I have personally reviewed following labs and imaging studies  CBC: Recent Labs  Lab 01/01/21 0157 01/05/21 0406  WBC 8.0 7.3  NEUTROABS 5.7 5.3  HGB 13.9 12.9  HCT 44.7 41.5  MCV 89.8 91.6  PLT 129* 118*   Basic Metabolic Panel: Recent Labs  Lab 01/01/21 0157 01/05/21 0406  NA 139 140  K 3.9 4.7  CL 100 98  CO2 29 33*  GLUCOSE 107* 115*  BUN 19 26*  CREATININE 1.11* 0.99  CALCIUM 8.7* 9.1   GFR: CrCl cannot be calculated (Unknown ideal weight.). Liver Function Tests: No results for input(s): AST, ALT, ALKPHOS, BILITOT, PROT, ALBUMIN in the last 168 hours. No results for input(s): LIPASE, AMYLASE in the last  168 hours. No results for input(s): AMMONIA in the last 168 hours. Coagulation Profile: No results for input(s): INR, PROTIME in the last 168 hours. Cardiac Enzymes: No results for input(s): CKTOTAL, CKMB, CKMBINDEX, TROPONINI in the last 168 hours. BNP (last 3 results) No results for input(s): PROBNP in the last 8760 hours. HbA1C: No results for input(s): HGBA1C in the last 72 hours. CBG: No results for input(s): GLUCAP in the last 168 hours. Lipid Profile: No results for input(s): CHOL, HDL, LDLCALC, TRIG, CHOLHDL, LDLDIRECT in the last 72 hours. Thyroid Function Tests: No results for input(s): TSH, T4TOTAL, FREET4, T3FREE, THYROIDAB in the last 72 hours. Anemia Panel: No results for input(s): VITAMINB12, FOLATE, FERRITIN, TIBC, IRON, RETICCTPCT in the last 72 hours. Urine analysis:    Component Value Date/Time   COLORURINE YELLOW 01/01/2021 0300   APPEARANCEUR HAZY (A) 01/01/2021 0300   LABSPEC 1.008 01/01/2021 0300   PHURINE 5.0 01/01/2021 0300   GLUCOSEU NEGATIVE 01/01/2021 0300   GLUCOSEU NEGATIVE 04/17/2012 1232   HGBUR NEGATIVE 01/01/2021 0300   BILIRUBINUR NEGATIVE 01/01/2021 0300   BILIRUBINUR neg 12/22/2016 1124   KETONESUR NEGATIVE 01/01/2021 0300   PROTEINUR NEGATIVE 01/01/2021 0300   UROBILINOGEN negative (A) 12/22/2016 1124   UROBILINOGEN 0.2 01/07/2015 1751   NITRITE POSITIVE (A) 01/01/2021 0300   LEUKOCYTESUR LARGE (A) 01/01/2021 0300    Radiological Exams on Admission: DG Chest 2 View  Result Date: 01/05/2021 CLINICAL DATA:  Malaise. EXAM: CHEST - 2 VIEW COMPARISON:  12/15/2019 FINDINGS: Hazy opacification of the left mid to upper chest with probable visualization of the left major fissure which is upward bowed. Pleural fluid is suggested on the lateral view. Milder hazy density over the right chest. Borderline heart size. Stable mediastinal contours which are distorted by scoliosis and rotation. IMPRESSION: Hazy opacification of the bilateral chest, greater  on the left where there could be multi segment collapse. Consider chest CT. Electronically Signed   By: Kathrynn Ducking.D.  On: 01/05/2021 04:35   CT Chest Wo Contrast  Result Date: 01/05/2021 CLINICAL DATA:  Pneumonia.  Abnormal chest x-ray. EXAM: CT CHEST WITHOUT CONTRAST TECHNIQUE: Multidetector CT imaging of the chest was performed following the standard protocol without IV contrast. COMPARISON:  Chest x-ray earlier same day.  Chest CT 08/09/2018 FINDINGS: Cardiovascular: Heart size mildly increased. Coronary artery calcification is evident. Mild atherosclerotic calcification is noted in the wall of the thoracic aorta. Mediastinum/Nodes: 1.9 cm short axis prevascular node identified on 45/2. 1.1 cm short axis identified in the AP window and there is a 1.0 cm short axis lymph node in the subcarinal station. The esophagus has normal imaging features. Surgical clips noted right axilla. There is no axillary lymphadenopathy. Upper normal lymph nodes in the left thoracic inlet measure up to 9 mm short axis. Lungs/Pleura: Volume loss with complete opacification of the left upper lobe. Left upper lobe bronchus is obliterated. Dependent atelectasis noted left lower lobe with small left pleural effusion. No suspicious pulmonary nodule or mass in the right lung. Upper Abdomen: 5.7 cm incompletely visualized water density structure in the left upper quadrant probably represents enlargement of an exophytic left renal superior pole cyst visualized on CT stone study of 06/27/2015. Musculoskeletal: No worrisome lytic or sclerotic osseous abnormality. IMPRESSION: 1. Complete opacification of the left upper lobe with associated volume loss. Left upper lobe bronchus is obliterated. Imaging features suggest central obstructing mass lesion. CT chest with contrast may prove helpful to further delineate obstructive etiology. PET-CT could also be considered to further assess. 2. Mediastinal lymphadenopathy concerning for  metastatic disease. This could also be further assessed at PET-CT. 3. Small left pleural effusion with dependent atelectasis in the left lower lobe. 4. Aortic Atherosclerosis (ICD10-I70.0). Electronically Signed   By: Misty Stanley M.D.   On: 01/05/2021 07:08    EKG: None obtained in ED  Assessment/Plan Left bronchial obstruction w/ LUL collapse Dyspnea/Cough     - admitted to obs, tele     - O2 support as needed     - PCCM consulted, appreciate assistance     - concern for CA; check CTH, CT chest, CT ab/pelvis all w/ contrast  Dysuria     - recent treatment for pansensative e coli UTI w/ fosfomycin (01/01/21)     - rpt UA is clean     - c/o increased frequency and nausea     - tx w/ pyridium and anti-emetics for now; hold on further abx  Thrombocytopenia     - no evidence of bleed     - follow  Azotemia     - hold diuretics for today and follow  HTN Chronic Lymphedema     - hold home metolazone and spironolactone for today; follow BUN, can resume tomorrow     - BP is ok and edema, while present, is trace to 1+   ?Cognitive decline (unspecified)     - no specific diagnosis and appears to be high functioning     - she is on namenda, seroquel, and zoloft; can continue for now  Obesity hypoventilation syndrome Chronic hypoxic respiratory failure     - had PRN 2L O2 at home  Hypothyroidism     - continue synthroid  CKD3a     - at baseline, follow  DVT prophylaxis: SCDs  Code Status: DNR, confirmed w/ patient  Family Communication: w/ son-in-law at bedside  Consults called: PCCM  Status is: Observation  The patient remains OBS appropriate and will d/c  before 2 midnights.  Dispo: The patient is from: SNF              Anticipated d/c is to: SNF              Patient currently is not medically stable to d/c.   Difficult to place patient No  Time spent coordinating admission: 60 minutes  [facility]  If 7PM-7AM, please contact  night-coverage [email or web address]  01/05/2021, 7:36 AM

## 2021-01-05 NOTE — Consult Note (Signed)
NAME:  Tamara Morales, MRN:  [id number], DOB:  [birth year], LOS: 0 ADMISSION DATE:  01/05/2021, CONSULTATION DATE:  01/05/21 REFERRING MD:  Dr Ronaldo Miyamoto, CHIEF COMPLAINT:  abnormal CT chest   History of Present Illness:  87 year old lady will resides at friend's home, recently treated for urinary tract infection following ED visit on 7/16 Came back to the hospital today because she was not feeling any better Denies any chest pains, no cough, just with general malaise A chest x-ray was obtained which was abnormal leading to a CT scan of the chest being performed CT scan of the chest did reveal a left upper lobe collapse  Pertinent  Medical History   Past Medical History:  Diagnosis Date   Altered mental status    ARTHRITIS, KNEES, BILATERAL    Breast CA (HCC) 2000   s/p R lumpectomy and XRT   CHF (congestive heart failure) (HCC)    DEPRESSION    DJD (degenerative joint disease) of knee    GERD    HYPERLIPIDEMIA    HYPERTENSION    HYPOTHYROIDISM    postsurgical   Hypoxia    MIGRAINE HEADACHE    Morbid obesity (HCC)    OSA (obstructive sleep apnea) 01/17/2011 dx   Oxygen dependent    Urge incontinence    UTI (lower urinary tract infection) 12/2014     Significant Hospital Events: Including procedures, antibiotic start and stop dates in addition to other pertinent events   CT scan of the chest with left upper lobe collapse  Interim History / Subjective:  Patient just not feeling well generally  Objective   Blood pressure (!) 143/68, pulse 80, temperature 98.3 F (36.8 C), temperature source Oral, resp. rate 20, SpO2 98 %.       No intake or output data in the 24 hours ending 01/05/21 1128 There were no vitals filed for this visit.  Examination: General: Elderly lady, frail HENT: Moist oral mucosa Lungs: Decreased air movement bilaterally Cardiovascular: S1-S2 appreciated, no murmur Abdomen: Soft, bowel sounds appreciated Extremities: No clubbing, no edema Neuro:  Alert and oriented to person and place GU: Fair output   CT chest was reviewed with the patient's son-in-law CT scan of the chest was compared with one performed in 2020 february  [facility] Problem list     Assessment & Plan:  Abnormal CT scan of the chest showing left upper lobe atelectasis Left upper lobe endobronchial process Mediastinal adenopathy -With a history of smoking in the past, quit over 40 years ago -This is likely a neoplastic process  Did discuss findings with patient and son-in-law who was present during the visit He will discuss with patient's daughter and other family members and we will review and discuss plans of care 01/05/2021  Findings in the lung is likely not contributing to current debility and symptoms  Risk versus benefit, desire for treatment, desire to be aware of what is causing the CT abnormality needs to be weighed against the risks and Ms. Gengler who is frail at baseline  Urinary tract infection -Being treated  Tumor cytopenia -We will follow  Hypertension Azotemia  Obesity hypoventilation Chronic hypoxic respiratory failure -On oxygen, only using it as needed  Hypothyroidism   Best Practice (right click and "Reselect all SmartList Selections" daily)   Diet/type: Regular consistency (see orders) DVT prophylaxis: SCD GI prophylaxis: N/A Lines: N/A Foley:  N/A Code Status:  full code Last date of multidisciplinary goals of care discussion [per primary]  Labs  CBC: Recent Labs  Lab 01/01/21 0157 01/05/21 0406  WBC 8.0 7.3  NEUTROABS 5.7 5.3  HGB 13.9 12.9  HCT 44.7 41.5  MCV 89.8 91.6  PLT 129* 118*    Basic Metabolic Panel: Recent Labs  Lab 01/01/21 0157 01/05/21 0406  NA 139 140  K 3.9 4.7  CL 100 98  CO2 29 33*  GLUCOSE 107* 115*  BUN 19 26*  CREATININE 1.11* 0.99  CALCIUM 8.7* 9.1   GFR: CrCl cannot be calculated (Unknown ideal weight.). Recent Labs  Lab 01/01/21 0157 01/05/21 0406  WBC 8.0  7.3    Liver Function Tests: No results for input(s): AST, ALT, ALKPHOS, BILITOT, PROT, ALBUMIN in the last 168 hours. No results for input(s): LIPASE, AMYLASE in the last 168 hours. No results for input(s): AMMONIA in the last 168 hours.  ABG    Component Value Date/Time   PHART 7.259 (L) 06/30/2015 1121   PCO2ART 55.3 (H) 06/30/2015 1121   PO2ART 175 (H) 06/30/2015 1121   HCO3 23.9 06/30/2015 1121   TCO2 32 07/05/2018 1337   ACIDBASEDEF 3.1 (H) 06/30/2015 1121   O2SAT 99.1 06/30/2015 1121     Coagulation Profile: No results for input(s): INR, PROTIME in the last 168 hours.  Cardiac Enzymes: No results for input(s): CKTOTAL, CKMB, CKMBINDEX, TROPONINI in the last 168 hours.  HbA1C: Hemoglobin A1C  Date/Time Value Ref Range Status  10/05/2016 12:00 AM 6.0  Final   Hgb A1c MFr Bld  Date/Time Value Ref Range Status  11/25/2020 07:40 AM 6.1 (H) <5.7 % of total Hgb Final    Comment:    For someone without known diabetes, a hemoglobin  A1c value between 5.7% and 6.4% is consistent with prediabetes and should be confirmed with a  follow-up test. . For someone with known diabetes, a value <7% indicates that their diabetes is well controlled. A1c targets should be individualized based on duration of diabetes, age, comorbid conditions, and other considerations. . This assay result is consistent with an increased risk of diabetes. . Currently, no consensus exists regarding use of hemoglobin A1c for diagnosis of diabetes for children. [name]   12/25/2019 07:00 AM 5.8 (H) <5.7 % of total Hgb Final    Comment:    For someone without known diabetes, a hemoglobin  A1c value between 5.7% and 6.4% is consistent with prediabetes and should be confirmed with a  follow-up test. . For someone with known diabetes, a value <7% indicates that their diabetes is well controlled. A1c targets should be individualized based on duration of diabetes, age, comorbid conditions, and  other considerations. . This assay result is consistent with an increased risk of diabetes. . Currently, no consensus exists regarding use of hemoglobin A1c for diagnosis of diabetes for children. .     CBG: No results for input(s): GLUCAP in the last 168 hours.  Review of Systems:   No chest pain or chest discomfort, denies shortness of breath  Past Medical History:  She,  has a past medical history of Altered mental status, ARTHRITIS, KNEES, BILATERAL, Breast CA ([place]) (2000), CHF (congestive heart failure) ([place]), DEPRESSION, DJD (degenerative joint disease) of knee, GERD, HYPERLIPIDEMIA, HYPERTENSION, HYPOTHYROIDISM, Hypoxia, MIGRAINE HEADACHE, Morbid obesity ([place]), OSA (obstructive sleep apnea) (01/17/2011 dx), Oxygen dependent, Urge incontinence, and UTI (lower urinary tract infection) (12/2014).   Surgical History:   Past Surgical History:  Procedure Laterality Date   ABDOMINAL HYSTERECTOMY     ADENOIDECTOMY     BREAST BIOPSY  2000  BREAST LUMPECTOMY  2000   CATARACT EXTRACTION     CHOLECYSTECTOMY  1995   CYSTOSCOPY/URETEROSCOPY/HOLMIUM LASER Right 06/28/2015   Procedure: CYSTOSCOPY RIGHT RETROGRAD RIGHT URETEROSCOPY/HOLMIUM LASER WITH RIGHT STENT PLACEMENT;  Surgeon: Alexis Frock, MD;  Location: [facility];  Service: Urology;  Laterality: Right;   PARATHYROIDECTOMY     2-3 removed   REPLACEMENT TOTAL KNEE BILATERAL  1999, 2005   Right leg femur repaired     THYROIDECTOMY     TONSILLECTOMY     TUBAL LIGATION       Social History:   reports that she quit smoking about 46 years ago. Her smoking use included cigarettes. She has a 0.50 pack-year smoking history. She has never used smokeless tobacco. She reports that she does not drink alcohol and does not use drugs.   Family History:  Her family history includes Emphysema in her sister; Heart disease in her father and mother. There is no history of Coronary artery disease.   Allergies Allergies  Allergen Reactions    Atorvastatin Nausea Only   Azithromycin Itching   Beta Adrenergic Blockers Other (See Comments)    Depression    Ciprofloxacin Other (See Comments)    Edgy and very jumpy     Codeine Nausea And Vomiting   Darvon [Propoxyphene] Nausea And Vomiting   Epinephrine Other (See Comments)    Rapid pulse, sweats   Klonopin [Clonazepam] Other (See Comments)    Makes her feel very suicidal    Oxycodone Other (See Comments)    Patient felt like it altered her mental status "Crazy" . Hallucinations later as well.   Paxil [Paroxetine Hydrochloride] Other (See Comments)    Severe depression    Prednisone Other (See Comments)    "makes me feel really bad"   Propoxyphene Hcl Nausea And Vomiting   Tape Itching   Torsemide Other (See Comments)    Patient stated that she doesn't recall the reaction   Tramadol Other (See Comments)    Feels "jittery"   Vicodin [Hydrocodone-Acetaminophen] Other (See Comments)    Hallucinations   Meloxicam Diarrhea   Other Rash    Polyester sheets "cause me a rash"   Vancomycin Rash    Localized rash related to infusion rate     Home Medications  Prior to Admission medications   Medication Sig Start Date End Date Taking? Authorizing Provider  aspirin 81 MG tablet Take 81 mg by mouth daily.   Yes [provider]  CITRACAL PETITES/VITAMIN D 200-250 MG-UNIT TABS Take 1 tablet by mouth every morning. 11/17/20  Yes [provider]  cyanocobalamin 2000 MCG tablet Take 2,000 mcg by mouth daily.   Yes [provider]  FIBER PO Take 1 tablet by mouth 2 (two) times daily.   Yes [provider]  Melatonin 5 MG TABS Take 1 tablet (5 mg total) by mouth at bedtime. 02/11/19  Yes Virgie Dad, MD  memantine (NAMENDA) 10 MG tablet TAKE 1 TABLET BY MOUTH TWICE DAILY. Patient taking differently: Take 10 mg by mouth 2 (two) times daily. 11/05/20  Yes Virgie Dad, MD  metolazone (ZAROXOLYN) 2.5 MG tablet TAKE 1 TABLET FIVE DAYS A WEEK, TAKE 2  TABLETS ON MONDAY AND FRIDAY Patient taking differently: Take 2.5-5 mg by mouth See admin instructions. 2.5mg  on Sunday, Tuesday, Wednesday, Thursday, Saturday 5mg  on Monday and Friday 11/05/20  Yes Virgie Dad, MD  omeprazole (PRILOSEC) 20 MG capsule TAKE 1 CAPSULE TWICE DAILY BEFORE MEALS Patient taking differently: Take  20 mg by mouth 2 (two) times daily before a meal. 08/16/20  Yes Virgie Dad, MD  potassium chloride (MICRO-K) 10 MEQ CR capsule TAKE 4 CAPSULES DAILY. Patient taking differently: Take 20 mEq by mouth 2 (two) times daily. 08/16/20  Yes Virgie Dad, MD  QUEtiapine (SEROQUEL) 25 MG tablet TAKE 1/2 TABLET AT BEDTIME. Patient taking differently: Take 12.5 mg by mouth at bedtime. 08/16/20  Yes Virgie Dad, MD  saccharomyces boulardii (FLORASTOR) 250 MG capsule Take 1 capsule (250 mg total) by mouth daily. 02/11/19  Yes Virgie Dad, MD  sertraline (ZOLOFT) 100 MG tablet TAKE ONE TABLET AT BEDTIME. Patient taking differently: Take 100 mg by mouth at bedtime. 07/26/20  Yes Virgie Dad, MD  spironolactone (ALDACTONE) 25 MG tablet TAKE 1 TABLET ONCE DAILY. Patient taking differently: Take 25 mg by mouth daily. 08/16/20  Yes Virgie Dad, MD  SYNTHROID 137 MCG tablet TAKE 1 TABLET ONCE DAILY BEFORE BREAKFAST. Patient taking differently: Take 137 mcg by mouth daily before breakfast. 12/31/20  Yes Virgie Dad, MD  calcium citrate-vitamin D (CITRACAL+D) 315-200 MG-UNIT tablet Take 1 tablet by mouth daily. $RemoveBefo'200mg'[id number]$  tablet daily Patient not taking: Reported on 01/05/2021 02/11/19   Virgie Dad, MD  fluticasone [facility]) 50 MCG/ACT nasal spray Place 2 sprays into both nostrils daily. Patient not taking: Reported on 01/05/2021 08/08/20   Virgie Dad, MD  ondansetron (ZOFRAN) 4 MG tablet TAKE (1) TABLET BY MOUTH EVERY SIX HOURS AS NEEDED FOR NAUSEA. Patient not taking: Reported on 01/05/2021 02/11/19   Virgie Dad, MD  OXYGEN Inhale 2 L into the lungs continuous. KEEP O2  SATS >= 90%. Every Shift    [provider]    Sherrilyn Rist, MD [place] [facility] Pager: See Shea Evans

## 2021-01-05 NOTE — ED Provider Notes (Signed)
[facility] DEPT Provider Note   CSN: [id number] Arrival date & time: 01/05/21  0340     History Chief Complaint  Patient presents with   Urinary Tract Infection    Tamara Morales is a 86 y.o. female.  The history is provided by the patient.  Urinary Tract Infection She has history of hypertension, diabetes, hyperlipidemia, diastolic heart failure, dementia and was recently diagnosed with a urinary tract infection.  Tonight, she felt cold and a little bit short of breath.  There has been a slight cough which is nonproductive.  There has been some nausea but no vomiting.  She denies constipation or diarrhea.  She denies any dysuria but does have chronic urinary incontinence.   Past Medical History:  Diagnosis Date   Altered mental status    ARTHRITIS, KNEES, BILATERAL    Breast CA ([place]) 2000   s/p R lumpectomy and XRT   CHF (congestive heart failure) (HCC)    DEPRESSION    DJD (degenerative joint disease) of knee    GERD    HYPERLIPIDEMIA    HYPERTENSION    HYPOTHYROIDISM    postsurgical   Hypoxia    MIGRAINE HEADACHE    Morbid obesity (HCC)    OSA (obstructive sleep apnea) 01/17/2011 dx   Oxygen dependent    Urge incontinence    UTI (lower urinary tract infection) 12/2014    Patient Active Problem List   Diagnosis Date Noted   Rotator cuff tendinitis, left 05/06/2020   Rectal bleed 10/09/2019   Vascular dementia ([place]) 10/02/2019   Rash in adult 10/02/2019   Visual hallucinations 12/31/2018   Weight loss 12/10/2018   Allergic rhinitis 12/06/2018   Left ankle pain 12/06/2018   Hypocalcemia 12/02/2018   Fall 11/15/2018   Gait abnormality 11/15/2018   Hallucination, visual 11/14/2018   Confusion 11/10/2018   Generalized weakness 11/06/2018   Chronic diastolic CHF (congestive heart failure) ([place]) 10/29/2018   Vitamin B12 deficiency 05/23/2018   Anxiety 04/23/2018   Chronic respiratory failure with hypoxia ([place]) 04/23/2018    Acute on chronic diastolic (congestive) heart failure ([place]) 03/14/2018   Diabetes mellitus type 2 in obese ([place]) 03/12/2018   Lymphedema 11/20/2017   Counseling regarding advanced care planning and goals of care 09/20/2017   Hypomagnesemia 07/17/2017   Prediabetes 07/03/2017   Hyperlipidemia LDL goal <70 07/03/2017   Post-nasal drip 06/27/2017   CKD (chronic kidney disease) stage 3, GFR 30-59 ml/min (HCC) 05/22/2017   Bilateral dry eyes 04/20/2017   Hemorrhoids 04/09/2017   Chronic constipation 03/30/2017   Weight gain 12/07/2016   Neck pain on left side 10/05/2016   Chronic left shoulder pain 08/10/2016   Numbness and tingling of right leg 04/06/2016   IBS (irritable bowel syndrome) 11/18/2015   Hypokalemia 07/07/2015   Acid reflux 07/05/2015   UTI (urinary tract infection) 07/02/2015   Ureterolithiasis    Depression with anxiety 06/27/2015   History of breast cancer 06/27/2015   OSA (obstructive sleep apnea)    Obesity hypoventilation syndrome ([place]) 11/27/2014   Diarrhea 08/21/2013   Dyspnea 03/13/2013   Cough 03/05/2010   Morbid obesity ([place]) 10/04/2009   Hypothyroidism (acquired) 08/05/2009   Essential hypertension 08/05/2009    Past Surgical History:  Procedure Laterality Date   ABDOMINAL HYSTERECTOMY     ADENOIDECTOMY     BREAST BIOPSY  2000   BREAST LUMPECTOMY  2000   CATARACT EXTRACTION     CHOLECYSTECTOMY  1995   CYSTOSCOPY/URETEROSCOPY/HOLMIUM LASER Right 06/28/2015  Procedure: CYSTOSCOPY RIGHT RETROGRAD RIGHT URETEROSCOPY/HOLMIUM LASER WITH RIGHT STENT PLACEMENT;  Surgeon: Alexis Frock, MD;  Location: [facility];  Service: Urology;  Laterality: Right;   PARATHYROIDECTOMY     2-3 removed   REPLACEMENT TOTAL KNEE BILATERAL  1999, 2005   Right leg femur repaired     THYROIDECTOMY     TONSILLECTOMY     TUBAL LIGATION       OB History     Gravida  5   Para  5   Term      Preterm      AB      Living         SAB      IAB      Ectopic       Multiple      Live Births              Family History  Problem Relation Age of Onset   Heart disease Mother    Heart disease Father    Emphysema Sister    Coronary artery disease Neg Hx     Social History   Tobacco Use   Smoking status: Former    Packs/day: 0.25    Years: 2.00    Pack years: 0.50    Types: Cigarettes    Quit date: 06/19/1974    Years since quitting: 46.5   Smokeless tobacco: Never   Tobacco comments:    Quit in late 20's  Vaping Use   Vaping Use: Never used  Substance Use Topics   Alcohol use: No    Alcohol/week: 0.0 standard drinks   Drug use: No    Home Medications Prior to Admission medications   Medication Sig Start Date End Date Taking? Authorizing Provider  acetaminophen (TYLENOL) 500 MG tablet Take 250 mg by mouth every 6 (six) hours as needed.    [provider]  Artificial Tear Solution (GENTEAL TEARS) 0.1-0.2-0.3 % SOLN Apply 1 drop to eye. Both eyes    [provider]  aspirin 81 MG tablet Take 81 mg by mouth daily.    [provider]  calcium citrate-vitamin D (CITRACAL+D) 315-200 MG-UNIT tablet Take 1 tablet by mouth daily. $RemoveBefo'200mg'ZfXjwExsyKE$  tablet daily 02/11/19   Virgie Dad, MD  Calcium Polycarbophil (FIBER-CAPS PO) Take 2 capsules by mouth daily.    [provider]  cyanocobalamin 2000 MCG tablet Take 2,000 mcg by mouth daily.    [provider]  fluticasone (FLONASE) 50 MCG/ACT nasal spray Place 2 sprays into both nostrils daily. 08/08/20   Virgie Dad, MD  loperamide (IMODIUM) 2 MG capsule Take 1 capsule (2 mg total) by mouth daily as needed for diarrhea or loose stools. 02/11/19   Virgie Dad, MD  Melatonin 5 MG TABS Take 1 tablet (5 mg total) by mouth at bedtime. 02/11/19   Virgie Dad, MD  memantine (NAMENDA) 10 MG tablet TAKE 1 TABLET BY MOUTH TWICE DAILY. 11/05/20   Virgie Dad, MD  metolazone (ZAROXOLYN) 2.5 MG tablet TAKE 1 TABLET FIVE DAYS A WEEK, TAKE 2 TABLETS ON MONDAY AND  FRIDAY 11/05/20   Virgie Dad, MD  omeprazole (PRILOSEC) 20 MG capsule TAKE 1 CAPSULE TWICE DAILY BEFORE MEALS 08/16/20   Virgie Dad, MD  ondansetron (ZOFRAN) 4 MG tablet TAKE (1) TABLET BY MOUTH EVERY SIX HOURS AS NEEDED FOR NAUSEA. Patient taking differently: Take 4 mg by mouth every 6 (six) hours as needed for nausea or vomiting. 02/11/19  Virgie Dad, MD  OXYGEN Inhale 2 L into the lungs continuous. KEEP O2 SATS >= 90%. Every Shift    [provider]  potassium chloride (MICRO-K) 10 MEQ CR capsule TAKE 4 CAPSULES DAILY. 08/16/20   Virgie Dad, MD  QUEtiapine (SEROQUEL) 25 MG tablet TAKE 1/2 TABLET AT BEDTIME. 08/16/20   Virgie Dad, MD  saccharomyces boulardii (FLORASTOR) 250 MG capsule Take 1 capsule (250 mg total) by mouth daily. 02/11/19   Virgie Dad, MD  sertraline (ZOLOFT) 100 MG tablet TAKE ONE TABLET AT BEDTIME. 07/26/20   Virgie Dad, MD  spironolactone (ALDACTONE) 25 MG tablet TAKE 1 TABLET ONCE DAILY. 08/16/20   Virgie Dad, MD  SYNTHROID 137 MCG tablet TAKE 1 TABLET ONCE DAILY BEFORE BREAKFAST. 12/31/20   Virgie Dad, MD    Allergies    Atorvastatin, Azithromycin, Beta adrenergic blockers, Ciprofloxacin, Codeine, Darvon [propoxyphene], Epinephrine, Klonopin [clonazepam], Oxycodone, Paxil [paroxetine hydrochloride], Prednisone, Propoxyphene hcl, Tape, Torsemide, Tramadol, Vicodin [hydrocodone-acetaminophen], Meloxicam, Other, and Vancomycin  Review of Systems   Review of Systems  All other systems reviewed and are negative.  Physical Exam Updated Vital Signs BP (!) 143/81 (BP Location: Left Arm)   Pulse 95   Temp 98.3 F (36.8 C) (Oral)   Resp 18   SpO2 95%   Physical Exam Vitals and nursing note reviewed.  87 year old female, resting comfortably and in no acute distress. Vital signs are significant for borderline elevated blood pressure. Oxygen saturation is 95%, which is normal. Head is normocephalic and atraumatic. PERRLA, EOMI.  Oropharynx is clear. Neck is nontender and supple without adenopathy or JVD. Back is nontender and there is no CVA tenderness. Lungs are clear without rales, wheezes, or rhonchi. Chest is nontender. Heart has regular rate and rhythm without murmur. Abdomen is soft, flat, nontender without masses or hepatosplenomegaly and peristalsis is normoactive. Extremities have 1+ edema, full range of motion is present. Skin is warm and dry without rash. Neurologic: Mental status is normal, cranial nerves are intact, there are no motor or sensory deficits.  ED Results / Procedures / Treatments   Labs (all labs ordered are listed, but only abnormal results are displayed) Labs Reviewed  BASIC METABOLIC PANEL - Abnormal; Notable for the following components:      Result Value   CO2 33 (*)    Glucose, Bld 115 (*)    BUN 26 (*)    GFR, Estimated 55 (*)    All other components within normal limits  CBC WITH DIFFERENTIAL/PLATELET - Abnormal; Notable for the following components:   RDW 17.2 (*)    Platelets 118 (*)    All other components within normal limits  URINALYSIS, ROUTINE W REFLEX MICROSCOPIC   Radiology DG Chest 2 View  Result Date: 01/05/2021 CLINICAL DATA:  Malaise. EXAM: CHEST - 2 VIEW COMPARISON:  12/15/2019 FINDINGS: Hazy opacification of the left mid to upper chest with probable visualization of the left major fissure which is upward bowed. Pleural fluid is suggested on the lateral view. Milder hazy density over the right chest. Borderline heart size. Stable mediastinal contours which are distorted by scoliosis and rotation. IMPRESSION: Hazy opacification of the bilateral chest, greater on the left where there could be multi segment collapse. Consider chest CT. Electronically Signed   By: Monte Fantasia M.D.   On: 01/05/2021 04:35   CT Chest Wo Contrast  Result Date: 01/05/2021 CLINICAL DATA:  Pneumonia.  Abnormal chest x-ray. EXAM: CT CHEST WITHOUT CONTRAST  TECHNIQUE: Multidetector CT  imaging of the chest was performed following the standard protocol without IV contrast. COMPARISON:  Chest x-ray earlier same day.  Chest CT 08/09/2018 FINDINGS: Cardiovascular: Heart size mildly increased. Coronary artery calcification is evident. Mild atherosclerotic calcification is noted in the wall of the thoracic aorta. Mediastinum/Nodes: 1.9 cm short axis prevascular node identified on 45/2. 1.1 cm short axis identified in the AP window and there is a 1.0 cm short axis lymph node in the subcarinal station. The esophagus has normal imaging features. Surgical clips noted right axilla. There is no axillary lymphadenopathy. Upper normal lymph nodes in the left thoracic inlet measure up to 9 mm short axis. Lungs/Pleura: Volume loss with complete opacification of the left upper lobe. Left upper lobe bronchus is obliterated. Dependent atelectasis noted left lower lobe with small left pleural effusion. No suspicious pulmonary nodule or mass in the right lung. Upper Abdomen: 5.7 cm incompletely visualized water density structure in the left upper quadrant probably represents enlargement of an exophytic left renal superior pole cyst visualized on CT stone study of 06/27/2015. Musculoskeletal: No worrisome lytic or sclerotic osseous abnormality. IMPRESSION: 1. Complete opacification of the left upper lobe with associated volume loss. Left upper lobe bronchus is obliterated. Imaging features suggest central obstructing mass lesion. CT chest with contrast may prove helpful to further delineate obstructive etiology. PET-CT could also be considered to further assess. 2. Mediastinal lymphadenopathy concerning for metastatic disease. This could also be further assessed at PET-CT. 3. Small left pleural effusion with dependent atelectasis in the left lower lobe. 4. Aortic Atherosclerosis (ICD10-I70.0). Electronically Signed   By: Misty Stanley M.D.   On: 01/05/2021 07:08    Procedures Procedures   Medications Ordered in  ED Medications  ondansetron (ZOFRAN-ODT) disintegrating tablet 8 mg (8 mg Oral Given 01/05/21 0459)    ED Course  I have reviewed the triage vital signs and the nursing notes.  Pertinent labs & imaging results that were available during my care of the patient were reviewed by me and considered in my medical decision making (see chart for details).   MDM Rules/Calculators/A&P                         Malaise and patient recently treated for urinary tract infection.  Old records are reviewed, and she did have a urine culture done 4 days ago which was positive for E. coli and sensitive to all tested antibiotics.  She apparently was treated with fosfomycin, which should have given her a cure.  We will check screening labs, repeat urinalysis, chest x-ray.  Labs are significant for mild thrombocytopenia which had been present previously and is not significantly changed.  Chest x-ray had some hazy opacification worrisome for multisegment collapse.  She was sent for CT scan to clarify and there is complete left upper lobe collapse secondary to obliteration of bronchus going to the left upper lobe.  Also mediastinal adenopathy concerning for cancer.  Urinalysis is still pending.  Case is discussed with Dr. Marylyn Ishihara of [facility], who agrees to admit the patient.  Final Clinical Impression(s) / ED Diagnoses Final diagnoses:  Shortness of breath  Bronchial obstruction  Pleural effusion on left  Thrombocytopenia [facility])    Rx / DC Orders ED Discharge Orders     None        Delora Fuel, MD 10/27/00 [phone]

## 2021-01-06 DIAGNOSIS — E669 Obesity, unspecified: Secondary | ICD-10-CM

## 2021-01-06 DIAGNOSIS — G4733 Obstructive sleep apnea (adult) (pediatric): Secondary | ICD-10-CM

## 2021-01-06 DIAGNOSIS — E662 Morbid (severe) obesity with alveolar hypoventilation: Secondary | ICD-10-CM | POA: Diagnosis not present

## 2021-01-06 DIAGNOSIS — E1169 Type 2 diabetes mellitus with other specified complication: Secondary | ICD-10-CM | POA: Diagnosis not present

## 2021-01-06 DIAGNOSIS — I10 Essential (primary) hypertension: Secondary | ICD-10-CM | POA: Diagnosis not present

## 2021-01-06 DIAGNOSIS — J9809 Other diseases of bronchus, not elsewhere classified: Secondary | ICD-10-CM | POA: Diagnosis not present

## 2021-01-06 LAB — CBC
HCT: 40.4 % (ref 36.0–46.0)
Hemoglobin: 12.5 g/dL (ref 12.0–15.0)
MCH: 28.3 pg (ref 26.0–34.0)
MCHC: 30.9 g/dL (ref 30.0–36.0)
MCV: 91.6 fL (ref 80.0–100.0)
Platelets: 110 10*3/uL — ABNORMAL LOW (ref 150–400)
RBC: 4.41 MIL/uL (ref 3.87–5.11)
RDW: 17 % — ABNORMAL HIGH (ref 11.5–15.5)
WBC: 8.3 10*3/uL (ref 4.0–10.5)
nRBC: 0 % (ref 0.0–0.2)

## 2021-01-06 LAB — COMPREHENSIVE METABOLIC PANEL
ALT: 11 U/L (ref 0–44)
AST: 12 U/L — ABNORMAL LOW (ref 15–41)
Albumin: 3 g/dL — ABNORMAL LOW (ref 3.5–5.0)
Alkaline Phosphatase: 69 U/L (ref 38–126)
Anion gap: 11 (ref 5–15)
BUN: 17 mg/dL (ref 8–23)
CO2: 35 mmol/L — ABNORMAL HIGH (ref 22–32)
Calcium: 8.9 mg/dL (ref 8.9–10.3)
Chloride: 94 mmol/L — ABNORMAL LOW (ref 98–111)
Creatinine, Ser: 0.7 mg/dL (ref 0.44–1.00)
GFR, Estimated: >60 mL/min (ref >60)
Glucose, Bld: 89 mg/dL (ref 70–99)
Potassium: 3.7 mmol/L (ref 3.5–5.1)
Sodium: 140 mmol/L (ref 135–145)
Total Bilirubin: 0.7 mg/dL (ref 0.3–1.2)
Total Protein: 6.1 g/dL — ABNORMAL LOW (ref 6.5–8.1)

## 2021-01-06 NOTE — Progress Notes (Addendum)
PROGRESS NOTE    Tamara Morales  BJY:[id number] DOB: 1930/09/17 DOA: 01/05/2021 PCP: Mahlon Gammon, MD    Brief Narrative:  Ms. Bernardin was admitted to the hospital with working diagnosis of left bronchial obstruction with left upper lobe collapse  87 year old female past medical history for dyslipidemia, hypothyroidism and hypertension.  She presented with increased urinary frequency for about 10 days, 7/16 she was diagnosed with urinary tract infection and received antibiotics.  1 week leading to her hospitalization she developed dyspnea but no cough, positive generalized malaise.  Because of persistent symptoms she came back to the hospital for further evaluation.  On her initial physical examination blood pressure 143/81, heart rate 95, respiratory rate 18, temperature 98.3, oxygen saturation 95%, lungs were decreased breath sounds left upper lobe, no wheezing or rales, heart S1-S2, present, rhythmic, soft abdomen, no lower extremity edema.   Sodium 140, potassium 4.7, chloride 98, bicarb 33, glucose 115, BUN 26, creatinine 0.99, white count 7.3, hemoglobin 12.9, hematocrit 41.5, platelets 118. SARS COVID-19 negative.  Urinalysis specific gravity 1.009, negative nitrates.  Chest radiograph with the left upper lobe opacity. CT chest with complete opacification of the left upper lobe with associated volume loss.  Left upper lobe bronchus obliterated.  Features suggesting central obstructing mass. 5 x 2 x 2 0.9 x 4.4 cm soft tissue mass in the left suprahilar region and caseating left upper lobe pulmonary arteries and debris in the left upper lobe bronchus with complete collapse of the left upper lobe peripheral to this lesion.  No evidence of metastatic disease in the abdomen pelvis. Head CT negative for metastasis.  EKG 91 bpm, normal axis, normal intervals, sinus rhythm, no ST segment or T wave changes.  Patient has been evaluated by pulmonary, plan for diagnostic  bronchoscopy.  Assessment & Plan:   Principal Problem:   Bronchial obstruction Active Problems:   Hypothyroidism (acquired)   Morbid obesity (HCC)   Essential hypertension   Obesity hypoventilation syndrome (HCC)   OSA (obstructive sleep apnea)   Diabetes mellitus type 2 in obese (HCC)   Left upper lobe collapse due to endobronchial lesion, likely malignancy/ acute hypoxemic respiratory failure  Patient reports improvement in her dyspnea, oxygenation is 88 to 93% on 2 L.min per [place] [facility] uses home 02.   Continue oxymetry monitoring and supplemental 02 per [place] to keel 02 saturation more than 92%.  Patient has not decided about bronchoscopy yet, need to consider her advance age, chronic debility, non ambulatory state, obesity class 3, OSA and obesity hypoventilation syndrome, that gives her a high risk for peri-procedure complications.  Continue medical support for now.  Hold aspirin in case of bronchoscopy.   2. T2DM. Fasting glucose this am is 89, continue glucose monitor as with as needed CBG.  3. Obesity class 3/ OSA and obesity hypoventilation syndrome.  Calculated BMI is 44.5 Patient is not ambulatory uses electric wheelchair for mobility, severe knee osteoarthritis.   4. Hypothyroid,. Continue with levothyroxine  5. HTN. Continue blood pressure monitoring. Continue with spironolactone.  Hold on metolazone for now  6. Depression. Continue with sertraline and quetiapine. Om memantine.    Patient continue to be at high risk for worsening hypoxemic respiratory failure   Status is: Observation  The patient remains OBS appropriate and will d/c before 2 midnights.  Dispo: The patient is from: Home              Anticipated d/c is to: Home  Patient currently is not medically stable to d/c.   Difficult to place patient No   DVT prophylaxis: Enoxaparin   Code Status:   full  Family Communication:   I spoke with patient's daughter at the bedside, we talked in  detail about patient's condition, plan of care and prognosis and all questions were addressed.   Consultants:  Pulmonary     Subjective: Patient with positive dyspnea, no nausea or vomiting, no chest pain.   Objective: Vitals:   01/05/21 1546 01/05/21 1903 01/05/21 2300 01/06/21 0312  BP:  (!) 146/78 96/81 (!) 152/91  Pulse:  84 96 94  Resp:  20 18 18   Temp:  (!) 97.3 F (36.3 C) 97.8 F (36.6 C) 98 F (36.7 C)  TempSrc:  Oral Oral   SpO2:  93% (!) 88% 93%  Weight: 121.4 kg     Height: 5\' 5"  (1.651 m)       Intake/Output Summary (Last 24 hours) at 01/06/2021 1731 Last data filed at 01/06/2021 1038 Gross per 24 hour  Intake 240 ml  Output 200 ml  Net 40 ml   Filed Weights   01/05/21 1546  Weight: 121.4 kg    Examination:   General: Not in pain or dyspnea, deconditioned  Neurology: Awake and alert, non focal  E ENT: mild pallor, no icterus, oral mucosa moist Cardiovascular: No JVD. S1-S2 present, rhythmic, no gallops, rubs, or murmurs. No lower extremity edema. Pulmonary: positive breath sounds bilaterally, with no wheezing, scattered rhonchi and rales. Gastrointestinal. Abdomen soft and non tender Skin. No rashes Musculoskeletal: no joint deformities     Data Reviewed: I have personally reviewed following labs and imaging studies  CBC: Recent Labs  Lab 01/01/21 0157 01/05/21 0406 01/06/21 0524  WBC 8.0 7.3 8.3  NEUTROABS 5.7 5.3  --   HGB 13.9 12.9 12.5  HCT 44.7 41.5 40.4  MCV 89.8 91.6 91.6  PLT 129* 118* 110*   Basic Metabolic Panel: Recent Labs  Lab 01/01/21 0157 01/05/21 0406 01/06/21 0524  NA 139 140 140  K 3.9 4.7 3.7  CL 100 98 94*  CO2 29 33* 35*  GLUCOSE 107* 115* 89  BUN 19 26* 17  CREATININE 1.11* 0.99 0.70  CALCIUM 8.7* 9.1 8.9   GFR: Estimated Creatinine Clearance: 63.5 mL/min (by C-G formula based on SCr of 0.7 mg/dL). Liver Function Tests: Recent Labs  Lab 01/06/21 0524  AST 12*  ALT 11  ALKPHOS 69  BILITOT 0.7   PROT 6.1*  ALBUMIN 3.0*   No results for input(s): LIPASE, AMYLASE in the last 168 hours. No results for input(s): AMMONIA in the last 168 hours. Coagulation Profile: No results for input(s): INR, PROTIME in the last 168 hours. Cardiac Enzymes: No results for input(s): CKTOTAL, CKMB, CKMBINDEX, TROPONINI in the last 168 hours. BNP (last 3 results) No results for input(s): PROBNP in the last 8760 hours. HbA1C: No results for input(s): HGBA1C in the last 72 hours. CBG: No results for input(s): GLUCAP in the last 168 hours. Lipid Profile: No results for input(s): CHOL, HDL, LDLCALC, TRIG, CHOLHDL, LDLDIRECT in the last 72 hours. Thyroid Function Tests: No results for input(s): TSH, T4TOTAL, FREET4, T3FREE, THYROIDAB in the last 72 hours. Anemia Panel: No results for input(s): VITAMINB12, FOLATE, FERRITIN, TIBC, IRON, RETICCTPCT in the last 72 hours.    Radiology Studies: I have reviewed all of the imaging during this hospital visit personally     Scheduled Meds:  aspirin EC  81 mg Oral  Daily   levothyroxine  137 mcg Oral Q0600   melatonin  5 mg Oral QHS   memantine  10 mg Oral BID   metolazone  2.5 mg Oral Once per day on Sun Tue Wed Thu Sat   And   [START ON 01/07/2021] metolazone  5 mg Oral Once per day on Mon Fri   phenazopyridine  100 mg Oral TID WC   QUEtiapine  12.5 mg Oral QHS   saccharomyces boulardii  250 mg Oral Daily   sertraline  100 mg Oral QHS   spironolactone  25 mg Oral Daily   Continuous Infusions:   LOS: 0 days        Terrius Gentile Annett Gula, MD

## 2021-01-06 NOTE — Progress Notes (Addendum)
   NAME:  Tamara Morales, MRN:  [id number], DOB:  Jul 03, 1930, LOS: 0 ADMISSION DATE:  01/05/2021, CONSULTATION DATE:  01/05/21 REFERRING MD:  Dr Marylyn Ishihara, CHIEF COMPLAINT:  Abnormal CT chest   History of Present Illness:  87 year old lady will resides at friend's home, recently treated for urinary tract infection following ED visit on 7/16 Came back to the hospital today because she was not feeling any better Denies any chest pains, no cough, just with general malaise A chest x-ray was obtained which was abnormal leading to a CT scan of the chest being performed CT scan of the chest did reveal a left upper lobe collapse  Pertinent  Medical History   Past Medical History:  Diagnosis Date   Altered mental status    ARTHRITIS, KNEES, BILATERAL    Breast CA ([place]) 2000   s/p R lumpectomy and XRT   CHF (congestive heart failure) (HCC)    DEPRESSION    DJD (degenerative joint disease) of knee    GERD    HYPERLIPIDEMIA    HYPERTENSION    HYPOTHYROIDISM    postsurgical   Hypoxia    MIGRAINE HEADACHE    Morbid obesity ([place])    OSA (obstructive sleep apnea) 01/17/2011 dx   Oxygen dependent    Urge incontinence    UTI (lower urinary tract infection) 12/2014   Significant Hospital Events: Including procedures, antibiotic start and stop dates in addition to other pertinent events   CT scan of the chest with left upper lobe collapse  Interim History / Subjective:  Feels a little bit better today Still on oxygen supplementation  Objective   Blood pressure (!) 152/91, pulse 94, temperature 98 F (36.7 C), resp. rate 18, height $RemoveBe'5\' 5"'[id number]$  (1.651 m), weight 121.4 kg, SpO2 93 %.        Intake/Output Summary (Last 24 hours) at 01/06/2021 1001 Last data filed at 01/06/2021 0556 Gross per 24 hour  Intake --  Output 800 ml  Net -800 ml   Filed Weights   01/05/21 1546  Weight: 121.4 kg    Examination: General: Frail, elderly HENT: Moist oral mucosa Lungs: Decreased air movement  bilaterally Cardiovascular: S1-S2 appreciated, no murmur Abdomen: Soft, bowel sounds appreciated Extremities: No clubbing, no edema Neuro: Alert and oriented x3 GU: Fair output  I did speak with patient's daughters-2 of them were present,another daughter FaceTimed in-we did review the CAT scans together and compared CAT scans to previous, options of interventions were discussed  Resolved Hospital Problem list     Assessment & Plan:  Abnormal CT scan of the chest showing left upper lobe atelectasis Left upper lobe endobronchial process Mediastinal adenopathy -Past smoking history, quit over 40 years ago -This is likely neoplastic process  Bronchoscopy procedure discussed with family risks and benefits outlined Procedure will likely not make patient feel any better or any worse but will provide [place] with a diagnosis Patient is however frail at baseline, on oxygen supplementation which she does not use regularly, underlying history of obesity hypoventilation, obstructive sleep apnea Past history of breast cancer  Thrombocytopenia Urinary tract infection Hypertension Azotemia  Obesity hypoventilation Chronic hypoxemic respiratory failure  Bronchoscopy if family agreeable Will require general anesthesia on ventilator to be accomplished safely  Sherrilyn Rist, MD [place] [facility] Pager: See Shea Evans

## 2021-01-06 NOTE — Evaluation (Signed)
Physical Therapy Evaluation Patient Details Name: Tamara Morales MRN: [id number] DOB: [birth year] Today's Date: 01/06/2021   History of Present Illness  87 y.o. female admitted with malaise. Dx of L upper lobe collapse. She was seen in the ED on 7/16 and given an antibiotic for a UTI.  with medical history significant of HLD, hypothyroidism, HTN, obesity hypoventilation, breast cancer, CHF.  Clinical Impression  Pt admitted with above diagnosis. +2 mod assist for sit to stand, +2 min assist to take a few pivotal steps from bed to recliner with RW. Pt on 2L O2 throughout tx, SaO2 90% with activity, 3/4 dyspnea with activity.  Pt currently with functional limitations due to the deficits listed below (see PT Problem List). Pt will benefit from skilled PT to increase their independence and safety with mobility to allow discharge to the venue listed below.       Follow Up Recommendations SNF;Supervision/Assistance - 24 hour;Supervision for mobility/OOB    Equipment Recommendations  None recommended by PT    Recommendations for Other Services       Precautions / Restrictions Precautions Precautions: Fall Precaution Comments: denies falls in past 1 year Restrictions Weight Bearing Restrictions: No      Mobility  Bed Mobility Overal bed mobility: Needs Assistance Bed Mobility: Supine to Sit     Supine to sit: +2 for physical assistance;Mod assist     General bed mobility comments: assist to raise trunk and pivot hips to edge of bed    Transfers Overall transfer level: Needs assistance Equipment used: Rolling walker (2 wheeled) Transfers: Sit to/from [employer] Transfers Sit to Stand: +2 physical assistance;+2 safety/equipment;Mod assist Stand pivot transfers: +2 physical assistance;+2 safety/equipment;Min assist       General transfer comment: assist to power up, pt took several pivotal steps to recliner with RW, SaO2 90% on 2L, 3/4  dyspnea  Ambulation/Gait Ambulation/Gait assistance: Min assist;+2 physical assistance;+2 safety/equipment Gait Distance (Feet): 3 Feet Assistive device: Rolling walker (2 wheeled) Gait Pattern/deviations: Step-to pattern;Decreased step length - right;Decreased step length - left;Shuffle Gait velocity: decr   General Gait Details: shuffling steps from bed to recliner on 2L O2, SaO2 90%  Stairs            Wheelchair Mobility    Modified Rankin (Stroke Patients Only)       Balance Overall balance assessment: Needs assistance Sitting-balance support: Feet supported;No upper extremity supported Sitting balance-Leahy Scale: Good     Standing balance support: Bilateral upper extremity supported Standing balance-Leahy Scale: Poor Standing balance comment: relies on BUE support                             Pertinent Vitals/Pain Pain Assessment: No/denies pain    Home Living Family/patient expects to be discharged to:: Private residence Living Arrangements: Alone   Type of Home: Apartment Home Access: Level entry     Home Layout: One level Home Equipment: Wheelchair - power;Shower seat Additional Comments[facility]    Prior Function Level of Independence: Independent with assistive device(s)         Comments: uses motorized chair, can bath/dress independently; uses O2 at night; 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     Hand Dominance        Extremity/Trunk Assessment   Upper Extremity Assessment Upper Extremity Assessment: Generalized weakness    Lower Extremity Assessment Lower Extremity Assessment: Generalized weakness (B knee ext -4/5)    Cervical / Trunk Assessment  Cervical / Trunk Assessment: Normal  Communication   Communication: No difficulties  Cognition Arousal/Alertness: Awake/alert Behavior During Therapy: WFL for tasks assessed/performed Overall Cognitive Status: Within Functional Limits for tasks assessed                                         General Comments      Exercises     Assessment/Plan    PT Assessment Patient needs continued PT services  PT Problem List Decreased mobility;Decreased balance;Decreased activity tolerance;Cardiopulmonary status limiting activity       PT Treatment Interventions Gait training;Therapeutic activities;Therapeutic exercise;Balance training;Patient/family education    PT Goals (Current goals can be found in the Care Plan section)  Acute Rehab PT Goals Patient Stated Goal: agreeable to ST-SNF PT Goal Formulation: With patient/family Time For Goal Achievement: 01/20/21 Potential to Achieve Goals: Good    Frequency Min 2X/week   Barriers to discharge        Co-evaluation               AM-PAC PT "6 Clicks" Mobility  Outcome Measure Help needed turning from your back to your side while in a flat bed without using bedrails?: A Lot Help needed moving from lying on your back to sitting on the side of a flat bed without using bedrails?: A Lot Help needed moving to and from a bed to a chair (including a wheelchair)?: A Lot Help needed standing up from a chair using your arms (e.g., wheelchair or bedside chair)?: A Lot Help needed to walk in hospital room?: Total Help needed climbing 3-5 steps with a railing? : Total 6 Click Score: 10    End of Session Equipment Utilized During Treatment: Gait belt;Oxygen Activity Tolerance: Patient tolerated treatment well Patient left: in chair;with call bell/phone within reach;with nursing/sitter in room Nurse Communication: Mobility status PT Visit Diagnosis: Muscle weakness (generalized) (M62.81);Difficulty in walking, not elsewhere classified (R26.2)    Time: 1457-1530 PT Time Calculation (min) (ACUTE ONLY): 33 min   Charges:   PT Evaluation $PT Eval Moderate Complexity: 1 Mod PT Treatments $Therapeutic Activity: 8-22 mins       Blondell Reveal Kistler PT 01/06/2021  Acute  Rehabilitation Services Pager [phone] Office [phone]

## 2021-01-07 DIAGNOSIS — M6281 Muscle weakness (generalized): Secondary | ICD-10-CM | POA: Diagnosis not present

## 2021-01-07 DIAGNOSIS — E785 Hyperlipidemia, unspecified: Secondary | ICD-10-CM | POA: Diagnosis present

## 2021-01-07 DIAGNOSIS — Z66 Do not resuscitate: Secondary | ICD-10-CM | POA: Diagnosis present

## 2021-01-07 DIAGNOSIS — E890 Postprocedural hypothyroidism: Secondary | ICD-10-CM | POA: Diagnosis present

## 2021-01-07 DIAGNOSIS — N390 Urinary tract infection, site not specified: Secondary | ICD-10-CM | POA: Diagnosis present

## 2021-01-07 DIAGNOSIS — R32 Unspecified urinary incontinence: Secondary | ICD-10-CM | POA: Diagnosis present

## 2021-01-07 DIAGNOSIS — N1831 Chronic kidney disease, stage 3a: Secondary | ICD-10-CM | POA: Diagnosis present

## 2021-01-07 DIAGNOSIS — J9809 Other diseases of bronchus, not elsewhere classified: Secondary | ICD-10-CM | POA: Diagnosis present

## 2021-01-07 DIAGNOSIS — D696 Thrombocytopenia, unspecified: Secondary | ICD-10-CM | POA: Diagnosis present

## 2021-01-07 DIAGNOSIS — Z6841 Body Mass Index (BMI) 40.0 and over, adult: Secondary | ICD-10-CM | POA: Diagnosis not present

## 2021-01-07 DIAGNOSIS — I130 Hypertensive heart and chronic kidney disease with heart failure and stage 1 through stage 4 chronic kidney disease, or unspecified chronic kidney disease: Secondary | ICD-10-CM | POA: Diagnosis present

## 2021-01-07 DIAGNOSIS — F0390 Unspecified dementia without behavioral disturbance: Secondary | ICD-10-CM | POA: Diagnosis present

## 2021-01-07 DIAGNOSIS — I5032 Chronic diastolic (congestive) heart failure: Secondary | ICD-10-CM | POA: Diagnosis present

## 2021-01-07 DIAGNOSIS — I890 Lymphedema, not elsewhere classified: Secondary | ICD-10-CM | POA: Diagnosis present

## 2021-01-07 DIAGNOSIS — R41841 Cognitive communication deficit: Secondary | ICD-10-CM | POA: Diagnosis not present

## 2021-01-07 DIAGNOSIS — R1313 Dysphagia, pharyngeal phase: Secondary | ICD-10-CM | POA: Diagnosis not present

## 2021-01-07 DIAGNOSIS — Z20822 Contact with and (suspected) exposure to covid-19: Secondary | ICD-10-CM | POA: Diagnosis present

## 2021-01-07 DIAGNOSIS — I110 Hypertensive heart disease with heart failure: Secondary | ICD-10-CM | POA: Diagnosis not present

## 2021-01-07 DIAGNOSIS — K219 Gastro-esophageal reflux disease without esophagitis: Secondary | ICD-10-CM | POA: Diagnosis present

## 2021-01-07 DIAGNOSIS — R0602 Shortness of breath: Secondary | ICD-10-CM | POA: Diagnosis not present

## 2021-01-07 DIAGNOSIS — D519 Vitamin B12 deficiency anemia, unspecified: Secondary | ICD-10-CM | POA: Diagnosis not present

## 2021-01-07 DIAGNOSIS — R531 Weakness: Secondary | ICD-10-CM | POA: Diagnosis not present

## 2021-01-07 DIAGNOSIS — E119 Type 2 diabetes mellitus without complications: Secondary | ICD-10-CM | POA: Diagnosis not present

## 2021-01-07 DIAGNOSIS — I509 Heart failure, unspecified: Secondary | ICD-10-CM | POA: Diagnosis not present

## 2021-01-07 DIAGNOSIS — R5383 Other fatigue: Secondary | ICD-10-CM | POA: Diagnosis not present

## 2021-01-07 DIAGNOSIS — E1122 Type 2 diabetes mellitus with diabetic chronic kidney disease: Secondary | ICD-10-CM | POA: Diagnosis present

## 2021-01-07 DIAGNOSIS — R5381 Other malaise: Secondary | ICD-10-CM | POA: Diagnosis not present

## 2021-01-07 DIAGNOSIS — F0150 Vascular dementia without behavioral disturbance: Secondary | ICD-10-CM | POA: Diagnosis present

## 2021-01-07 DIAGNOSIS — J9811 Atelectasis: Secondary | ICD-10-CM | POA: Diagnosis present

## 2021-01-07 DIAGNOSIS — J9621 Acute and chronic respiratory failure with hypoxia: Secondary | ICD-10-CM | POA: Diagnosis present

## 2021-01-07 DIAGNOSIS — Z743 Need for continuous supervision: Secondary | ICD-10-CM | POA: Diagnosis not present

## 2021-01-07 DIAGNOSIS — R2681 Unsteadiness on feet: Secondary | ICD-10-CM | POA: Diagnosis not present

## 2021-01-07 DIAGNOSIS — Z9981 Dependence on supplemental oxygen: Secondary | ICD-10-CM | POA: Diagnosis not present

## 2021-01-07 DIAGNOSIS — J309 Allergic rhinitis, unspecified: Secondary | ICD-10-CM | POA: Diagnosis present

## 2021-01-07 DIAGNOSIS — E662 Morbid (severe) obesity with alveolar hypoventilation: Secondary | ICD-10-CM | POA: Diagnosis present

## 2021-01-07 DIAGNOSIS — E892 Postprocedural hypoparathyroidism: Secondary | ICD-10-CM | POA: Diagnosis present

## 2021-01-07 DIAGNOSIS — B9620 Unspecified Escherichia coli [E. coli] as the cause of diseases classified elsewhere: Secondary | ICD-10-CM | POA: Diagnosis present

## 2021-01-07 DIAGNOSIS — R29898 Other symptoms and signs involving the musculoskeletal system: Secondary | ICD-10-CM | POA: Diagnosis not present

## 2021-01-07 DIAGNOSIS — F329 Major depressive disorder, single episode, unspecified: Secondary | ICD-10-CM | POA: Diagnosis present

## 2021-01-07 NOTE — Progress Notes (Signed)
   NAME:  Tamara Morales, MRN:  [id number], DOB:  1931-05-27, LOS: 0 ADMISSION DATE:  01/05/2021, CONSULTATION DATE:  01/05/21 REFERRING MD:  Dr Marylyn Ishihara, CHIEF COMPLAINT:  Abnormal CT chest   History of Present Illness:  87 year old lady will resides at friend's home, recently treated for urinary tract infection following ED visit on 7/16 Came back to the hospital today because she was not feeling any better Denies any chest pains, no cough, just with general malaise A chest x-ray was obtained which was abnormal leading to a CT scan of the chest being performed CT scan of the chest did reveal a left upper lobe collapse  Pertinent  Medical History   Past Medical History:  Diagnosis Date   Altered mental status    ARTHRITIS, KNEES, BILATERAL    Breast CA ([place]) 2000   s/p R lumpectomy and XRT   CHF (congestive heart failure) (HCC)    DEPRESSION    DJD (degenerative joint disease) of knee    GERD    HYPERLIPIDEMIA    HYPERTENSION    HYPOTHYROIDISM    postsurgical   Hypoxia    MIGRAINE HEADACHE    Morbid obesity ([place])    OSA (obstructive sleep apnea) 01/17/2011 dx   Oxygen dependent    Urge incontinence    UTI (lower urinary tract infection) 12/2014   Significant Hospital Events: Including procedures, antibiotic start and stop dates in addition to other pertinent events   CT scan of the chest with left upper lobe collapse  Interim History / Subjective:  Feels a little bit better today Still on oxygen supplementation Denies any significant complaint at present, no overnight events elderly, frail  Objective   Blood pressure (!) 105/59, pulse 87, temperature (!) 97.5 F (36.4 C), temperature source Oral, resp. rate 18, height $RemoveBe'5\' 5"'fazGBMeAJ$  (1.651 m), weight 121.4 kg, SpO2 91 %.        Intake/Output Summary (Last 24 hours) at 01/07/2021 1158 Last data filed at 01/07/2021 1031 Gross per 24 hour  Intake 780 ml  Output 2026 ml  Net -1246 ml   Filed Weights   01/05/21 1546  Weight:  121.4 kg    Examination: General: Elderly, frail HENT: Moist oral mucosa  Lungs: Decreased air movement bilaterally Cardiovascular: S1-S2 appreciated Abdomen: Bowel sounds appreciated Extremities: No clubbing, no edema Neuro: Alert and oriented x3 GU: Fair output  I did speak with patient's daughters-2 of them were present,another daughter FaceTimed in-we did review the CAT scans together and compared CAT scans to previous, options of interventions were discussed 7/21  Spoke at length with patient regarding discussions with daughters, discussed bronchoscopy procedure, risk with procedure, what we hope to achieve with the procedure -She has not made up her mind regarding undergoing bronchoscopy at present  Resolved Hospital Problem list     Assessment & Plan:  Abnormal CT scan of the chest showing left upper lobe atelectasis Left upper lobe endobronchial process Mediastinal adenopathy Likely neoplastic process  Thrombocytopenia Urinary tract infection Hypertension  Bronchoscopy discussed at length with patient Risks with procedure discussed, reason for procedure discussed, She will continue to speak with her family  Has not made up her mind at present regarding whether she wants to have any invasive investigation performed  Sherrilyn Rist, MD [facility] Pager: See Shea Evans

## 2021-01-07 NOTE — Progress Notes (Signed)
PROGRESS NOTE    Tamara Morales  AQT:[id number] DOB: 12/28/1930 DOA: 01/05/2021 PCP: Virgie Dad, MD    Brief Narrative:  This 87 years old female with PMH significant for dyslipidemia, hypothyroidism, hypertension presented in the ED with increased urinary frequency for about 10 days.  Patient was diagnosed with UTI and has received antibiotics for 1 week.  She has developed shortness of breath,  generalized malaise.  Because of persistent symptoms she came back in the hospital for further evaluation. Chest x-ray shows left upper lobe opacity.  CT chest showed complete opacification of left upper lobe with associated volume loss.  Left upper lobe bronchus obliterated,  features suggesting central obstructing mass.  Head CT negative for metastasis.  Patient was admitted for left bronchial obstruction with upper lobe collapse,  pulmonology consulted recommended diagnostic bronchoscopy but patient has not decided yet.  Assessment & Plan:   Principal Problem:   Bronchial obstruction Active Problems:   Hypothyroidism (acquired)   Morbid obesity (HCC)   Essential hypertension   Obesity hypoventilation syndrome (HCC)   OSA (obstructive sleep apnea)   Diabetes mellitus type 2 in obese (HCC)   Acute hypoxic respiratory failure sec. to Left upper lobe collapse due to endobronchial lesion, likely malignancy: Patient presented with hypoxia,  87% on room air improved to 93% on 2 L. At home uses oxygen as needed.  Continue supplemental oxygen to keep saturation above 92%. CT chest showed complete opacification of left upper lobe with associated volume loss,  left upper lobe bronchus obliterated features suggesting central obstructing mass. Pulmonology consulted recommended a diagnostic bronchoscopy. Bronchoscopy will not likely make patient feel better or worse but will provide [place] with a diagnosis. Patient has not decided about bronchoscopy yet, need to consider her advance age, chronic  debility, non ambulatory state, obesity class 3, OSA and obesity hypoventilation syndrome, that gives her a high risk for peri-procedure complications. Continue supportive care. Hold aspirin in case of bronchoscopy.    Type 2 diabetes: Continue glucose monitoring.  Fasting sugar 89  Hypothyroidism: Continue levothyroxine  Hypertension: Continue spironolactone, hold on metolazone for now.  Major depression : Continue Zoloft and quetiapine.  Continue Namenda  Obesity class III: OSA obesity hypoventilation syndrome. Patient uses electric wheelchair for mobility.  Calculated BMI 44.5      DVT prophylaxis: Lovenox Code Status: DNR Family Communication: No family at bedside Disposition Plan:    Status is: Observation  The patient remains OBS appropriate and will d/c before 2 midnights.  Dispo: The patient is from: Home              Anticipated d/c is to: SNF              Patient currently is not medically stable to d/c.   Difficult to place patient No   Consultants:  Pulmonology  Procedures:CT chest Antimicrobials:   Anti-infectives (From admission, onward)    None        Subjective: Patient was seen and examined at bedside.  She reports still feeling short of breath but denies any chest pain.   She wants to wait to make a decision about bronchoscopy.  Objective: Vitals:   01/06/21 0312 01/06/21 1836 01/06/21 2035 01/07/21 0445  BP: (!) 152/91 (!) 130/97 133/78 (!) 105/59  Pulse: 94 88 88 87  Resp: $Remo'18 20 18 18  'JpBwl$ Temp: 98 F (36.7 C) 98.6 F (37 C) 97.7 F (36.5 C) (!) 97.5 F (36.4 C)  TempSrc:  Oral Oral Oral  SpO2: 93% 94% 96% 91%  Weight:      Height:        Intake/Output Summary (Last 24 hours) at 01/07/2021 1128 Last data filed at 01/07/2021 1031 Gross per 24 hour  Intake 780 ml  Output 2026 ml  Net -1246 ml   Filed Weights   01/05/21 1546  Weight: 121.4 kg    Examination:  General exam: Appears calm and comfortable, deconditioned,  frail Respiratory system: Clear to auscultation. Respiratory effort normal. Cardiovascular system: S1 & S2 heard, RRR. No JVD, murmurs, rubs, gallops or clicks. No pedal edema. Gastrointestinal system: Abdomen is nondistended, soft and nontender. No organomegaly or masses felt. Normal bowel sounds heard. Central nervous system: Alert and oriented. No focal neurological deficits. Extremities: No edema, no cyanosis, no clubbing. Skin: No rashes, lesions or ulcers Psychiatry: Judgement and insight appear normal. Mood & affect appropriate.     Data Reviewed: I have personally reviewed following labs and imaging studies  CBC: Recent Labs  Lab 01/01/21 0157 01/05/21 0406 01/06/21 0524  WBC 8.0 7.3 8.3  NEUTROABS 5.7 5.3  --   HGB 13.9 12.9 12.5  HCT 44.7 41.5 40.4  MCV 89.8 91.6 91.6  PLT 129* 118* 284*   Basic Metabolic Panel: Recent Labs  Lab 01/01/21 0157 01/05/21 0406 01/06/21 0524  NA 139 140 140  K 3.9 4.7 3.7  CL 100 98 94*  CO2 29 33* 35*  GLUCOSE 107* 115* 89  BUN 19 26* 17  CREATININE 1.11* 0.99 0.70  CALCIUM 8.7* 9.1 8.9   GFR: Estimated Creatinine Clearance: 63.5 mL/min (by C-G formula based on SCr of 0.7 mg/dL). Liver Function Tests: Recent Labs  Lab 01/06/21 0524  AST 12*  ALT 11  ALKPHOS 69  BILITOT 0.7  PROT 6.1*  ALBUMIN 3.0*   No results for input(s): LIPASE, AMYLASE in the last 168 hours. No results for input(s): AMMONIA in the last 168 hours. Coagulation Profile: No results for input(s): INR, PROTIME in the last 168 hours. Cardiac Enzymes: No results for input(s): CKTOTAL, CKMB, CKMBINDEX, TROPONINI in the last 168 hours. BNP (last 3 results) No results for input(s): PROBNP in the last 8760 hours. HbA1C: No results for input(s): HGBA1C in the last 72 hours. CBG: No results for input(s): GLUCAP in the last 168 hours. Lipid Profile: No results for input(s): CHOL, HDL, LDLCALC, TRIG, CHOLHDL, LDLDIRECT in the last 72 hours. Thyroid  Function Tests: No results for input(s): TSH, T4TOTAL, FREET4, T3FREE, THYROIDAB in the last 72 hours. Anemia Panel: No results for input(s): VITAMINB12, FOLATE, FERRITIN, TIBC, IRON, RETICCTPCT in the last 72 hours. Sepsis Labs: No results for input(s): PROCALCITON, LATICACIDVEN in the last 168 hours.  Recent Results (from the past 240 hour(s))  Urine Culture     Status: Abnormal   Collection Time: 01/01/21  3:00 AM   Specimen: Urine, Clean Catch  Result Value Ref Range Status   Specimen Description   Final    URINE, CLEAN CATCH Performed at [facility], [place] [address], [place], [place] [address]    Special Requests   Final    NONE Performed at [facility], [place] [address] [place], [place] [address]    Culture >=100,000 COLONIES/mL ESCHERICHIA COLI (A)  Final   Report Status 01/03/2021 FINAL  Final   Organism ID, Bacteria ESCHERICHIA COLI (A)  Final      Susceptibility   Escherichia coli - MIC*    AMPICILLIN <=2 SENSITIVE Sensitive  CEFAZOLIN <=4 SENSITIVE Sensitive     CEFEPIME <=0.12 SENSITIVE Sensitive     CEFTRIAXONE <=0.25 SENSITIVE Sensitive     CIPROFLOXACIN <=0.25 SENSITIVE Sensitive     GENTAMICIN <=1 SENSITIVE Sensitive     IMIPENEM <=0.25 SENSITIVE Sensitive     NITROFURANTOIN <=16 SENSITIVE Sensitive     TRIMETH/SULFA <=20 SENSITIVE Sensitive     AMPICILLIN/SULBACTAM <=2 SENSITIVE Sensitive     PIP/TAZO <=4 SENSITIVE Sensitive     * >=100,000 COLONIES/mL ESCHERICHIA COLI  Resp Panel by RT-PCR (Flu A&B, Covid) Nasopharyngeal Swab     Status: None   Collection Time: 01/05/21  7:27 AM   Specimen: Nasopharyngeal Swab; Nasopharyngeal(NP) swabs in vial transport medium  Result Value Ref Range Status   SARS Coronavirus 2 by RT PCR NEGATIVE NEGATIVE Final    Comment: (NOTE) SARS-CoV-2 target nucleic acids are NOT DETECTED.  The SARS-CoV-2 RNA is generally detectable in upper respiratory specimens during the acute phase of  infection. The lowest concentration of SARS-CoV-2 viral copies this assay can detect is 138 copies/mL. A negative result does not preclude SARS-Cov-2 infection and should not be used as the sole basis for treatment or other patient management decisions. A negative result may occur with  improper specimen collection/handling, submission of specimen other than nasopharyngeal swab, presence of viral mutation(s) within the areas targeted by this assay, and inadequate number of viral copies(<138 copies/mL). A negative result must be combined with clinical observations, patient history, and epidemiological information. The expected result is Negative.  Fact Sheet for Patients:  BloggerCourse.com  Fact Sheet for Healthcare Providers:  SeriousBroker.it  This test is no t yet approved or cleared by the [place] FDA and  has been authorized for detection and/or diagnosis of SARS-CoV-2 by FDA under an Emergency Use Authorization (EUA). This EUA will remain  in effect (meaning this test can be used) for the duration of the COVID-19 declaration under Section 564(b)(1) of the Act, 21 U.S.C.section 360bbb-3(b)(1), unless the authorization is terminated  or revoked sooner.       Influenza A by PCR NEGATIVE NEGATIVE Final   Influenza B by PCR NEGATIVE NEGATIVE Final    Comment: (NOTE) The Xpert Xpress SARS-CoV-2/FLU/RSV plus assay is intended as an aid in the diagnosis of influenza from Nasopharyngeal swab specimens and should not be used as a sole basis for treatment. Nasal washings and aspirates are unacceptable for Xpert Xpress SARS-CoV-2/FLU/RSV testing.  Fact Sheet for Patients: BloggerCourse.com  Fact Sheet for Healthcare Providers: SeriousBroker.it  This test is not yet approved or cleared by the [place] FDA and has been authorized for detection and/or diagnosis of SARS-CoV-2  by FDA under an Emergency Use Authorization (EUA). This EUA will remain in effect (meaning this test can be used) for the duration of the COVID-19 declaration under Section 564(b)(1) of the Act, 21 U.S.C. section 360bbb-3(b)(1), unless the authorization is terminated or revoked.  Performed at [facility], [address] [place], [place] [address]      Radiology Studies: No results found.  Scheduled Meds:  levothyroxine  137 mcg Oral Q0600   melatonin  5 mg Oral QHS   memantine  10 mg Oral BID   phenazopyridine  100 mg Oral TID WC   QUEtiapine  12.5 mg Oral QHS   saccharomyces boulardii  250 mg Oral Daily   sertraline  100 mg Oral QHS   spironolactone  25 mg Oral Daily   Continuous Infusions:   LOS: 0 days  Time spent: 35 mins    Shawna Clamp, MD [facility]   If 7PM-7AM, please contact night-coverage

## 2021-01-07 NOTE — TOC Initial Note (Signed)
Transition of Care [facility]) - Initial/Assessment Note    Patient Details  Name: Tamara Morales MRN: [id number] Date of Birth: 1931-04-13  Transition of Care [facility] CM/SW Contact:    Ida Rogue, LCSW Phone Number: 01/07/2021, 11:53 AM  Clinical Narrative:   Patient seen in follow up to PT recommendation of SNF.  Ms Simek states she is open to going to SNF.  First she is visiting with each of her 5 daughters to try to figure out course of treatment re: her "lung cancer."    She is a resident of [facility].  I spoke with Burnetta Sabin to let her know of plan.  She indicated neither she nor [facility] [address] have a bed today, nor over the weekend. Will plan on return on Monday.  No insurance authorization required as she is straight MCR. TOC will continue to follow during the course of hospitalization.   Expected Discharge Plan: Skilled Nursing Facility Barriers to Discharge: No Barriers Identified   Patient Goals and CMS Choice     Choice offered to / list presented to : Patient  Expected Discharge Plan and Services Expected Discharge Plan: Skilled Nursing Facility In-house Referral: Clinical Social Work   Post Acute Care Choice: Skilled Nursing Facility Living arrangements for the past 2 months: Apartment                                      Prior Living Arrangements/Services Living arrangements for the past 2 months: Apartment Lives with:: Self Patient language and need for interpreter reviewed:: Yes        Need for Family Participation in Patient Care: Yes (Comment) Care giver support system in place?: Yes (comment)   Criminal Activity/Legal Involvement Pertinent to Current Situation/Hospitalization: No - Comment as needed  Activities of Daily Living Home Assistive Devices/Equipment: Electric scooter ADL Screening (condition at time of admission) Patient's cognitive ability adequate to safely complete daily activities?: No Is the patient deaf or have difficulty  hearing?: Yes Does the patient have difficulty seeing, even when wearing glasses/contacts?: No Does the patient have difficulty concentrating, remembering, or making decisions?: Yes Patient able to express need for assistance with ADLs?: Yes Does the patient have difficulty dressing or bathing?: Yes Independently performs ADLs?: No Communication: Independent Dressing (OT): Needs assistance Is this a change from baseline?: Pre-admission baseline Grooming: Needs assistance Is this a change from baseline?: Pre-admission baseline Feeding: Independent Bathing: Independent Toileting: Needs assistance Is this a change from baseline?: Pre-admission baseline In/Out Bed: Needs assistance Is this a change from baseline?: Pre-admission baseline Walks in Home: Dependent Is this a change from baseline?: Pre-admission baseline Does the patient have difficulty walking or climbing stairs?: Yes Weakness of Legs: Both Weakness of Arms/Hands: None  Permission Sought/Granted                  Emotional Assessment Appearance:: Appears stated age Attitude/Demeanor/Rapport: Engaged Affect (typically observed): Appropriate Orientation: : Oriented to Self, Oriented to Place, Oriented to  Time, Oriented to Situation Alcohol / Substance Use: Not Applicable Psych Involvement: No (comment)  Admission diagnosis:  Shortness of breath [R06.02] Thrombocytopenia (HCC) [D69.6] Pleural effusion on left [J90] Bronchial obstruction [J98.09] Patient Active Problem List   Diagnosis Date Noted   Bronchial obstruction 01/05/2021   Rotator cuff tendinitis, left 05/06/2020   Rectal bleed 10/09/2019   Vascular dementia (HCC) 10/02/2019   Rash in adult 10/02/2019  Visual hallucinations 12/31/2018   Weight loss 12/10/2018   Allergic rhinitis 12/06/2018   Left ankle pain 12/06/2018   Hypocalcemia 12/02/2018   Fall 11/15/2018   Gait abnormality 11/15/2018   Hallucination, visual 11/14/2018   Confusion  11/10/2018   Generalized weakness 11/06/2018   Chronic diastolic CHF (congestive heart failure) ([place]) 10/29/2018   Vitamin B12 deficiency 05/23/2018   Anxiety 04/23/2018   Chronic respiratory failure with hypoxia ([place]) 04/23/2018   Acute on chronic diastolic (congestive) heart failure ([place]) 03/14/2018   Diabetes mellitus type 2 in obese ([place]) 03/12/2018   Lymphedema 11/20/2017   Counseling regarding advanced care planning and goals of care 09/20/2017   Hypomagnesemia 07/17/2017   Prediabetes 07/03/2017   Hyperlipidemia LDL goal <70 07/03/2017   Post-nasal drip 06/27/2017   CKD (chronic kidney disease) stage 3, GFR 30-59 ml/min (HCC) 05/22/2017   Bilateral dry eyes 04/20/2017   Hemorrhoids 04/09/2017   Chronic constipation 03/30/2017   Weight gain 12/07/2016   Neck pain on left side 10/05/2016   Chronic left shoulder pain 08/10/2016   Numbness and tingling of right leg 04/06/2016   IBS (irritable bowel syndrome) 11/18/2015   Hypokalemia 07/07/2015   Acid reflux 07/05/2015   UTI (urinary tract infection) 07/02/2015   Ureterolithiasis    Depression with anxiety 06/27/2015   History of breast cancer 06/27/2015   OSA (obstructive sleep apnea)    Obesity hypoventilation syndrome ([place]) 11/27/2014   Diarrhea 08/21/2013   Dyspnea 03/13/2013   Cough 03/05/2010   Morbid obesity ([place]) 10/04/2009   Hypothyroidism (acquired) 08/05/2009   Essential hypertension 08/05/2009   PCP:  Virgie Dad, MD Pharmacy:   [place], [place] [address] Phone: [phone] Fax: [phone]     Social Determinants of Health (SDOH) Interventions    Readmission Risk Interventions No flowsheet data found.

## 2021-01-08 DIAGNOSIS — J9809 Other diseases of bronchus, not elsewhere classified: Secondary | ICD-10-CM | POA: Diagnosis not present

## 2021-01-08 MED ORDER — HYDROXYZINE HCL 25 MG PO TABS
25.0000 mg | ORAL_TABLET | Freq: Once | ORAL | Status: AC
  Administered 2021-01-08: 25 mg via ORAL
  Filled 2021-01-08: qty 1

## 2021-01-08 NOTE — Progress Notes (Signed)
PROGRESS NOTE    Tamara Morales  KWI:[id number] DOB: [birth year] DOA: 01/05/2021 PCP: Virgie Dad, MD    Brief Narrative:  This 87 years old female with PMH significant for dyslipidemia, hypothyroidism, hypertension presented in the ED with increased urinary frequency for about 10 days.  Patient was diagnosed with UTI and has received antibiotics for 1 week.  She has developed shortness of breath,  generalized malaise.  Because of persistent symptoms she came back in the hospital for further evaluation. Chest x-ray shows left upper lobe opacity.  CT chest showed complete opacification of left upper lobe with associated volume loss.  Left upper lobe bronchus obliterated,  features suggesting central obstructing mass.  Head CT negative for metastasis.  Patient was admitted for left bronchial obstruction with upper lobe collapse,  pulmonology consulted recommended diagnostic bronchoscopy but patient has not decided yet.  Assessment & Plan:   Principal Problem:   Bronchial obstruction Active Problems:   Hypothyroidism (acquired)   Morbid obesity (HCC)   Essential hypertension   Obesity hypoventilation syndrome (HCC)   OSA (obstructive sleep apnea)   Diabetes mellitus type 2 in obese (HCC)   Acute hypoxic respiratory failure sec. to Left upper lobe collapse / atelectasis due to endobronchial lesion, likely malignancy: Patient presented with hypoxia,  87% on room air improved to 93% on 2 L. At home uses oxygen as needed.  Continue supplemental oxygen to keep saturation above 92%. CT chest showed complete opacification of left upper lobe with associated volume loss,  left upper lobe bronchus obliterated features suggesting central obstructing mass. Pulmonology consulted recommended a diagnostic bronchoscopy. Bronchoscopy will not likely make patient feel better or worse but will provide [place] with a diagnosis. Patient has not decided about bronchoscopy yet, need to consider her advance age,  chronic debility, non ambulatory state, obesity class 3, OSA and obesity hypoventilation syndrome, that gives her a high risk for peri-procedure complications. Continue supportive care. Hold aspirin in case of bronchoscopy.    Type 2 diabetes: Continue glucose monitoring.  Fasting sugar 89  Hypothyroidism: Continue levothyroxine  Hypertension: Continue spironolactone, hold on metolazone for now.  Major depression : Continue Zoloft and quetiapine.  Continue Namenda  Obesity class III: OSA obesity hypoventilation syndrome. Patient uses electric wheelchair for mobility.  Calculated BMI 44.5   DVT prophylaxis: Lovenox Code Status: DNR Family Communication: No family at bedside Disposition Plan:    Status is: Inpatient  Remains inpatient appropriate because:Inpatient level of care appropriate due to severity of illness  Dispo: The patient is from: Home              Anticipated d/c is to: Home              Patient currently is not medically stable to d/c.   Difficult to place patient No   Consultants:  Pulmonology  Procedures:CT chest Antimicrobials:   Anti-infectives (From admission, onward)    None        Subjective: Patient was seen and examined at bedside.Overnight events noted. Patient is sitting comfortably on chair. She declined to have bronchoscopy but not sure about comfort measures at this time.  She denies any chest pain or shortness of breath.  she is on 2 L of oxygen.  Objective: Vitals:   01/07/21 0445 01/07/21 1429 01/07/21 2017 01/08/21 0444  BP: (!) 105/59 125/67 126/70 132/66  Pulse: 87 92 85 92  Resp: $Remo'18 20 20 20  'Aniem$ Temp: (!) 97.5 F (36.4 C) 97.8 F (36.6 C)  98 F (36.7 C) 98.3 F (36.8 C)  TempSrc: Oral Oral Oral Oral  SpO2: 91% 93% 95% 96%  Weight:      Height:        Intake/Output Summary (Last 24 hours) at 01/08/2021 1302 Last data filed at 01/08/2021 8453 Gross per 24 hour  Intake 240 ml  Output 800 ml  Net -560 ml   Filed Weights    01/05/21 1546  Weight: 121.4 kg    Examination:  General exam: Appears calm and comfortable, deconditioned, frail, Respiratory system: Clear to auscultation. Respiratory effort normal. Cardiovascular system: S1 & S2 heard, RRR. No JVD, murmurs, rubs, gallops or clicks. No pedal edema. Gastrointestinal system: Abdomen is nondistended, soft and nontender. No organomegaly or masses felt. Normal bowel sounds heard. Central nervous system: Alert and oriented. No focal neurological deficits. Extremities: No edema, no cyanosis, no clubbing. Skin: No rashes, lesions or ulcers Psychiatry: Judgement and insight appear normal. Mood & affect appropriate.     Data Reviewed: I have personally reviewed following labs and imaging studies  CBC: Recent Labs  Lab 01/05/21 0406 01/06/21 0524  WBC 7.3 8.3  NEUTROABS 5.3  --   HGB 12.9 12.5  HCT 41.5 40.4  MCV 91.6 91.6  PLT 118* 646*   Basic Metabolic Panel: Recent Labs  Lab 01/05/21 0406 01/06/21 0524  NA 140 140  K 4.7 3.7  CL 98 94*  CO2 33* 35*  GLUCOSE 115* 89  BUN 26* 17  CREATININE 0.99 0.70  CALCIUM 9.1 8.9   GFR: Estimated Creatinine Clearance: 63.5 mL/min (by C-G formula based on SCr of 0.7 mg/dL). Liver Function Tests: Recent Labs  Lab 01/06/21 0524  AST 12*  ALT 11  ALKPHOS 69  BILITOT 0.7  PROT 6.1*  ALBUMIN 3.0*   No results for input(s): LIPASE, AMYLASE in the last 168 hours. No results for input(s): AMMONIA in the last 168 hours. Coagulation Profile: No results for input(s): INR, PROTIME in the last 168 hours. Cardiac Enzymes: No results for input(s): CKTOTAL, CKMB, CKMBINDEX, TROPONINI in the last 168 hours. BNP (last 3 results) No results for input(s): PROBNP in the last 8760 hours. HbA1C: No results for input(s): HGBA1C in the last 72 hours. CBG: No results for input(s): GLUCAP in the last 168 hours. Lipid Profile: No results for input(s): CHOL, HDL, LDLCALC, TRIG, CHOLHDL, LDLDIRECT in the last  72 hours. Thyroid Function Tests: No results for input(s): TSH, T4TOTAL, FREET4, T3FREE, THYROIDAB in the last 72 hours. Anemia Panel: No results for input(s): VITAMINB12, FOLATE, FERRITIN, TIBC, IRON, RETICCTPCT in the last 72 hours. Sepsis Labs: No results for input(s): PROCALCITON, LATICACIDVEN in the last 168 hours.  Recent Results (from the past 240 hour(s))  Urine Culture     Status: Abnormal   Collection Time: 01/01/21  3:00 AM   Specimen: Urine, Clean Catch  Result Value Ref Range Status   Specimen Description   Final    URINE, CLEAN CATCH Performed at [facility], [place] [address] [place], [place] [address]    Special Requests   Final    NONE Performed at [facility], [place] [address] [place], [place] [address]    Culture >=100,000 COLONIES/mL ESCHERICHIA COLI (A)  Final   Report Status 01/03/2021 FINAL  Final   Organism ID, Bacteria ESCHERICHIA COLI (A)  Final      Susceptibility   Escherichia coli - MIC*    AMPICILLIN <=2 SENSITIVE Sensitive     CEFAZOLIN <=4  SENSITIVE Sensitive     CEFEPIME <=0.12 SENSITIVE Sensitive     CEFTRIAXONE <=0.25 SENSITIVE Sensitive     CIPROFLOXACIN <=0.25 SENSITIVE Sensitive     GENTAMICIN <=1 SENSITIVE Sensitive     IMIPENEM <=0.25 SENSITIVE Sensitive     NITROFURANTOIN <=16 SENSITIVE Sensitive     TRIMETH/SULFA <=20 SENSITIVE Sensitive     AMPICILLIN/SULBACTAM <=2 SENSITIVE Sensitive     PIP/TAZO <=4 SENSITIVE Sensitive     * >=100,000 COLONIES/mL ESCHERICHIA COLI  Resp Panel by RT-PCR (Flu A&B, Covid) Nasopharyngeal Swab     Status: None   Collection Time: 01/05/21  7:27 AM   Specimen: Nasopharyngeal Swab; Nasopharyngeal(NP) swabs in vial transport medium  Result Value Ref Range Status   SARS Coronavirus 2 by RT PCR NEGATIVE NEGATIVE Final    Comment: (NOTE) SARS-CoV-2 target nucleic acids are NOT DETECTED.  The SARS-CoV-2 RNA is generally detectable in upper respiratory specimens during  the acute phase of infection. The lowest concentration of SARS-CoV-2 viral copies this assay can detect is 138 copies/mL. A negative result does not preclude SARS-Cov-2 infection and should not be used as the sole basis for treatment or other patient management decisions. A negative result may occur with  improper specimen collection/handling, submission of specimen other than nasopharyngeal swab, presence of viral mutation(s) within the areas targeted by this assay, and inadequate number of viral copies(<138 copies/mL). A negative result must be combined with clinical observations, patient history, and epidemiological information. The expected result is Negative.  Fact Sheet for Patients:  EntrepreneurPulse.com.au  Fact Sheet for Healthcare Providers:  IncredibleEmployment.be  This test is no t yet approved or cleared by the [place] FDA and  has been authorized for detection and/or diagnosis of SARS-CoV-2 by FDA under an Emergency Use Authorization (EUA). This EUA will remain  in effect (meaning this test can be used) for the duration of the COVID-19 declaration under Section 564(b)(1) of the Act, 21 U.S.C.section 360bbb-3(b)(1), unless the authorization is terminated  or revoked sooner.       Influenza A by PCR NEGATIVE NEGATIVE Final   Influenza B by PCR NEGATIVE NEGATIVE Final    Comment: (NOTE) The Xpert Xpress SARS-CoV-2/FLU/RSV plus assay is intended as an aid in the diagnosis of influenza from Nasopharyngeal swab specimens and should not be used as a sole basis for treatment. Nasal washings and aspirates are unacceptable for Xpert Xpress SARS-CoV-2/FLU/RSV testing.  Fact Sheet for Patients: EntrepreneurPulse.com.au  Fact Sheet for Healthcare Providers: IncredibleEmployment.be  This test is not yet approved or cleared by the [place] FDA and has been authorized for detection and/or  diagnosis of SARS-CoV-2 by FDA under an Emergency Use Authorization (EUA). This EUA will remain in effect (meaning this test can be used) for the duration of the COVID-19 declaration under Section 564(b)(1) of the Act, 21 U.S.C. section 360bbb-3(b)(1), unless the authorization is terminated or revoked.  Performed at [facility], [place] [address] [place], [place] [address]      Radiology Studies: No results found.  Scheduled Meds:  levothyroxine  137 mcg Oral Q0600   melatonin  5 mg Oral QHS   memantine  10 mg Oral BID   QUEtiapine  12.5 mg Oral QHS   saccharomyces boulardii  250 mg Oral Daily   sertraline  100 mg Oral QHS   spironolactone  25 mg Oral Daily   Continuous Infusions:   LOS: 1 day    Time spent: 25 mins    Shawna Clamp, MD  [facility]   If 7PM-7AM, please contact night-coverage

## 2021-01-08 NOTE — NC FL2 (Signed)
[place] LEVEL OF CARE SCREENING TOOL     IDENTIFICATION  Patient Name: Tamara Morales Birthdate: March 23, 1931 Sex: female Admission Date (Current Location): 01/05/2021  [facility] [place] Number:  Herbalist and Address:  [facility],  [place] [address], [place]      Provider Number:    Attending Physician Name and Address:  Shawna Clamp, MD  Relative Name and Phone Number:  Richrd Prime (Daughter)   [phone]    Current Level of Care: Hospital Recommended Level of Care: [place] Prior Approval Number:    Date Approved/Denied:   PASRR Number: [id number]  Discharge Plan: SNF    Current Diagnoses: Patient Active Problem List   Diagnosis Date Noted   Bronchial obstruction 01/05/2021   Rotator cuff tendinitis, left 05/06/2020   Rectal bleed 10/09/2019   Vascular dementia ([place]) 10/02/2019   Rash in adult 10/02/2019   Visual hallucinations 12/31/2018   Weight loss 12/10/2018   Allergic rhinitis 12/06/2018   Left ankle pain 12/06/2018   Hypocalcemia 12/02/2018   Fall 11/15/2018   Gait abnormality 11/15/2018   Hallucination, visual 11/14/2018   Confusion 11/10/2018   Generalized weakness 11/06/2018   Chronic diastolic CHF (congestive heart failure) ([place]) 10/29/2018   Vitamin B12 deficiency 05/23/2018   Anxiety 04/23/2018   Chronic respiratory failure with hypoxia ([place]) 04/23/2018   Acute on chronic diastolic (congestive) heart failure ([place]) 03/14/2018   Diabetes mellitus type 2 in obese ([place]) 03/12/2018   Lymphedema 11/20/2017   Counseling regarding advanced care planning and goals of care 09/20/2017   Hypomagnesemia 07/17/2017   Prediabetes 07/03/2017   Hyperlipidemia LDL goal <70 07/03/2017   Post-nasal drip 06/27/2017   CKD (chronic kidney disease) stage 3, GFR 30-59 ml/min (HCC) 05/22/2017   Bilateral dry eyes 04/20/2017   Hemorrhoids 04/09/2017   Chronic constipation 03/30/2017    Weight gain 12/07/2016   Neck pain on left side 10/05/2016   Chronic left shoulder pain 08/10/2016   Numbness and tingling of right leg 04/06/2016   IBS (irritable bowel syndrome) 11/18/2015   Hypokalemia 07/07/2015   Acid reflux 07/05/2015   UTI (urinary tract infection) 07/02/2015   Ureterolithiasis    Depression with anxiety 06/27/2015   History of breast cancer 06/27/2015   OSA (obstructive sleep apnea)    Obesity hypoventilation syndrome ([place]) 11/27/2014   Diarrhea 08/21/2013   Dyspnea 03/13/2013   Cough 03/05/2010   Morbid obesity ([place]) 10/04/2009   Hypothyroidism (acquired) 08/05/2009   Essential hypertension 08/05/2009    Orientation RESPIRATION BLADDER Height & Weight     Self, Time, Situation, Place  O2 (2L [place]) External catheter Weight: 121.4 kg Height:  $Remove'5\' 5"'UelpxvC$  (165.1 cm)  BEHAVIORAL SYMPTOMS/MOOD NEUROLOGICAL BOWEL NUTRITION STATUS   (none)  (none) Continent Diet (see d/c summary)  AMBULATORY STATUS COMMUNICATION OF NEEDS Skin   Extensive Assist Verbally Normal                       Personal Care Assistance Level of Assistance  Bathing, Feeding, Dressing Bathing Assistance: Maximum assistance Feeding assistance: Independent Dressing Assistance: Maximum assistance     Functional Limitations Info  Sight, Hearing, Speech Sight Info: Adequate Hearing Info: Adequate Speech Info: Adequate    SPECIAL CARE FACTORS FREQUENCY  PT (By licensed PT)     PT Frequency: 5X/W              Contractures Contractures Info: Not present    Additional Factors Info  Code Status, Allergies Code Status Info: DNR Allergies Info: Atorvastatin   Azithromycin   Beta Adrenergic Blockers   Ciprofloxacin   Codeine   Darvon (Propoxyphene)   Epinephrine   Klonopin (Clonazepam)   Oxycodone   Paxil (Paroxetine Hydrochloride)   Prednisone   Propoxyphene Hcl   Tape   Torsemide   Tramadol   Vicodin (Hydrocodone-acetaminophen)   Meloxicam   Other   Vancomycin            Current Medications (01/08/2021):  This is the current hospital active medication list Current Facility-Administered Medications  Medication Dose Route Frequency Provider Last Rate Last Admin   acetaminophen (TYLENOL) tablet 650 mg  650 mg Oral Q6H PRN Marylyn Ishihara, Tyrone A, DO       Or   acetaminophen (TYLENOL) suppository 650 mg  650 mg Rectal Q6H PRN Marylyn Ishihara, Tyrone A, DO       alum & mag hydroxide-simeth (MAALOX/MYLANTA) 200-200-20 MG/5ML suspension 30 mL  30 mL Oral Q6H PRN Marylyn Ishihara, Tyrone A, DO       levalbuterol (XOPENEX) nebulizer solution 0.63 mg  0.63 mg Nebulization Q6H PRN Marylyn Ishihara, Tyrone A, DO       levothyroxine (SYNTHROID) tablet 137 mcg  137 mcg Oral Q0600 Marylyn Ishihara, Tyrone A, DO   137 mcg at 01/08/21 0550   melatonin tablet 5 mg  5 mg Oral QHS Kyle, Tyrone A, DO   5 mg at 01/07/21 2200   memantine (NAMENDA) tablet 10 mg  10 mg Oral BID Marylyn Ishihara, Tyrone A, DO   10 mg at 01/08/21 1032   prochlorperazine (COMPAZINE) injection 10 mg  10 mg Intravenous Q6H PRN Marylyn Ishihara, Tyrone A, DO   10 mg at 01/08/21 1125   QUEtiapine (SEROQUEL) tablet 12.5 mg  12.5 mg Oral QHS Kyle, Tyrone A, DO   12.5 mg at 01/07/21 2201   saccharomyces boulardii (FLORASTOR) capsule 250 mg  250 mg Oral Daily Marylyn Ishihara, Tyrone A, DO   250 mg at 01/08/21 1032   sertraline (ZOLOFT) tablet 100 mg  100 mg Oral QHS Kyle, Tyrone A, DO   100 mg at 01/07/21 2200   spironolactone (ALDACTONE) tablet 25 mg  25 mg Oral Daily Kyle, Tyrone A, DO   25 mg at 01/08/21 1032     Discharge Medications: Please see discharge summary for a list of discharge medications.  Relevant Imaging Results:  Relevant Lab Results:   Additional Information SSN: [place], [place]

## 2021-01-09 DIAGNOSIS — J9809 Other diseases of bronchus, not elsewhere classified: Secondary | ICD-10-CM | POA: Diagnosis not present

## 2021-01-09 LAB — RESP PANEL BY RT-PCR (FLU A&B, COVID) ARPGX2
Influenza A by PCR: NEGATIVE
Influenza B by PCR: NEGATIVE
SARS Coronavirus 2 by RT PCR: NEGATIVE

## 2021-01-09 LAB — CBC
HCT: 44.3 % (ref 36.0–46.0)
Hemoglobin: 13.8 g/dL (ref 12.0–15.0)
MCH: 27.8 pg (ref 26.0–34.0)
MCHC: 31.2 g/dL (ref 30.0–36.0)
MCV: 89.3 fL (ref 80.0–100.0)
Platelets: 140 10*3/uL — ABNORMAL LOW (ref 150–400)
RBC: 4.96 MIL/uL (ref 3.87–5.11)
RDW: 17.4 % — ABNORMAL HIGH (ref 11.5–15.5)
WBC: 6.5 10*3/uL (ref 4.0–10.5)
nRBC: 0 % (ref 0.0–0.2)

## 2021-01-09 MED ORDER — POLYVINYL ALCOHOL 1.4 % OP SOLN
1.0000 [drp] | OPHTHALMIC | Status: DC | PRN
  Filled 2021-01-09: qty 15

## 2021-01-09 NOTE — Progress Notes (Signed)
PROGRESS NOTE    Tamara Morales  SAY:[id number] DOB: 10-06-1930 DOA: 01/05/2021 PCP: Virgie Dad, MD    Brief Narrative:  This 87 years old female with PMH significant for dyslipidemia, hypothyroidism, hypertension presented in the ED with increased urinary frequency for about 10 days.  Patient was diagnosed with UTI and has received antibiotics for 1 week.  She has developed shortness of breath,  generalized malaise.  Because of persistent symptoms she came back in the hospital for further evaluation. Chest x-ray shows left upper lobe opacity.  CT chest showed complete opacification of left upper lobe with associated volume loss.  Left upper lobe bronchus obliterated,  features suggesting central obstructing mass.  Head CT negative for metastasis.  Patient was admitted for left bronchial obstruction with upper lobe collapse,  pulmonology consulted recommended diagnostic bronchoscopy but patient has not decided yet.  Assessment & Plan:   Principal Problem:   Bronchial obstruction Active Problems:   Hypothyroidism (acquired)   Morbid obesity (HCC)   Essential hypertension   Obesity hypoventilation syndrome (HCC)   OSA (obstructive sleep apnea)   Diabetes mellitus type 2 in obese (HCC)   Acute hypoxic respiratory failure sec. to Left upper lobe collapse / atelectasis due to endobronchial lesion, likely malignancy: Patient presented with hypoxia,  87% on room air improved to 93% on 2 L. At home uses oxygen as needed.  Continue supplemental oxygen to keep saturation above 92%. CT chest showed complete opacification of left upper lobe with associated volume loss,  left upper lobe bronchus obliterated features suggesting central obstructing mass. Pulmonology consulted recommended a diagnostic bronchoscopy. Bronchoscopy will not likely make patient feel better or worse but will provide [place] with a diagnosis. Need to consider her advance age, chronic debility, non ambulatory state, obesity  class 3, OSA and obesity hypoventilation syndrome, that gives her a high risk for peri-procedure complications. Continue supportive care. Hold aspirin in case of bronchoscopy.  Patient reports she does not want to have a bronchoscopy.   Type 2 diabetes: Continue glucose monitoring.  Fasting sugar 89. No sliding scale at this point.  Hypothyroidism: Continue levothyroxine  Hypertension: Continue spironolactone, hold on metolazone for now.  Major depression : Continue Zoloft and quetiapine.  Continue Namenda  Obesity class III: OSA obesity hypoventilation syndrome. Patient uses electric wheelchair for mobility.  Calculated BMI 44.5   DVT prophylaxis: Lovenox Code Status: DNR Family Communication: No family at bedside Disposition Plan:    Status is: Inpatient  Remains inpatient appropriate because:Inpatient level of care appropriate due to severity of illness  Dispo: The patient is from: Home              Anticipated d/c is to: Home              Patient currently is not medically stable to d/c.   Difficult to place patient No   Consultants:  Pulmonology  Procedures:CT chest Antimicrobials:   Anti-infectives (From admission, onward)    None        Subjective: Patient was seen and examined at bedside. Overnight events noted. Patient comfortably lying in the bed. She declined to have bronchoscopy but not sure about comfort measures at this time.   She denies any chest pain or shortness of breath.  she is on 2 L of oxygen.  Objective: Vitals:   01/08/21 0444 01/08/21 1358 01/08/21 2024 01/09/21 0549  BP: 132/66 117/81 (!) 153/82 (!) 142/88  Pulse: 92 88 88 88  Resp: 20 (!) 22  20 (!) 24  Temp: 98.3 F (36.8 C) (!) 97.4 F (36.3 C) 98.1 F (36.7 C) 97.6 F (36.4 C)  TempSrc: Oral Oral Oral Oral  SpO2: 96% (!) 80% 94% 92%  Weight:      Height:        Intake/Output Summary (Last 24 hours) at 01/09/2021 1129 Last data filed at 01/09/2021 0900 Gross per 24 hour   Intake 360 ml  Output 1700 ml  Net -1340 ml   Filed Weights   01/05/21 1546  Weight: 121.4 kg    Examination:  General exam: Appears calm and comfortable, deconditioned, frail, Respiratory system: Clear to auscultation. Respiratory effort normal. Cardiovascular system: S1 & S2 heard, RRR. No JVD, murmurs, rubs, gallops or clicks. No pedal edema. Gastrointestinal system: Abdomen is nondistended, soft and nontender. No organomegaly or masses felt. Normal bowel sounds heard. Central nervous system: Alert and oriented x 3. No focal neurological deficits. Extremities: No edema, no cyanosis, no clubbing. Skin: No rashes, lesions or ulcers Psychiatry: Judgement and insight appear normal. Mood & affect appropriate.     Data Reviewed: I have personally reviewed following labs and imaging studies  CBC: Recent Labs  Lab 01/05/21 0406 01/06/21 0524 01/09/21 0600  WBC 7.3 8.3 6.5  NEUTROABS 5.3  --   --   HGB 12.9 12.5 13.8  HCT 41.5 40.4 44.3  MCV 91.6 91.6 89.3  PLT 118* 110* 264*   Basic Metabolic Panel: Recent Labs  Lab 01/05/21 0406 01/06/21 0524  NA 140 140  K 4.7 3.7  CL 98 94*  CO2 33* 35*  GLUCOSE 115* 89  BUN 26* 17  CREATININE 0.99 0.70  CALCIUM 9.1 8.9   GFR: Estimated Creatinine Clearance: 63.5 mL/min (by C-G formula based on SCr of 0.7 mg/dL). Liver Function Tests: Recent Labs  Lab 01/06/21 0524  AST 12*  ALT 11  ALKPHOS 69  BILITOT 0.7  PROT 6.1*  ALBUMIN 3.0*   No results for input(s): LIPASE, AMYLASE in the last 168 hours. No results for input(s): AMMONIA in the last 168 hours. Coagulation Profile: No results for input(s): INR, PROTIME in the last 168 hours. Cardiac Enzymes: No results for input(s): CKTOTAL, CKMB, CKMBINDEX, TROPONINI in the last 168 hours. BNP (last 3 results) No results for input(s): PROBNP in the last 8760 hours. HbA1C: No results for input(s): HGBA1C in the last 72 hours. CBG: No results for input(s): GLUCAP in the  last 168 hours. Lipid Profile: No results for input(s): CHOL, HDL, LDLCALC, TRIG, CHOLHDL, LDLDIRECT in the last 72 hours. Thyroid Function Tests: No results for input(s): TSH, T4TOTAL, FREET4, T3FREE, THYROIDAB in the last 72 hours. Anemia Panel: No results for input(s): VITAMINB12, FOLATE, FERRITIN, TIBC, IRON, RETICCTPCT in the last 72 hours. Sepsis Labs: No results for input(s): PROCALCITON, LATICACIDVEN in the last 168 hours.  Recent Results (from the past 240 hour(s))  Urine Culture     Status: Abnormal   Collection Time: 01/01/21  3:00 AM   Specimen: Urine, Clean Catch  Result Value Ref Range Status   Specimen Description   Final    URINE, CLEAN CATCH Performed at [facility], [place] [address], [place], [place] [address]    Special Requests   Final    NONE Performed at [facility], [place] [address], [place], [place] [address]    Culture >=100,000 COLONIES/mL ESCHERICHIA COLI (A)  Final   Report Status 01/03/2021 FINAL  Final   Organism ID, Bacteria ESCHERICHIA COLI (A)  Final      Susceptibility   Escherichia coli - MIC*    AMPICILLIN <=2 SENSITIVE Sensitive     CEFAZOLIN <=4 SENSITIVE Sensitive     CEFEPIME <=0.12 SENSITIVE Sensitive     CEFTRIAXONE <=0.25 SENSITIVE Sensitive     CIPROFLOXACIN <=0.25 SENSITIVE Sensitive     GENTAMICIN <=1 SENSITIVE Sensitive     IMIPENEM <=0.25 SENSITIVE Sensitive     NITROFURANTOIN <=16 SENSITIVE Sensitive     TRIMETH/SULFA <=20 SENSITIVE Sensitive     AMPICILLIN/SULBACTAM <=2 SENSITIVE Sensitive     PIP/TAZO <=4 SENSITIVE Sensitive     * >=100,000 COLONIES/mL ESCHERICHIA COLI  Resp Panel by RT-PCR (Flu A&B, Covid) Nasopharyngeal Swab     Status: None   Collection Time: 01/05/21  7:27 AM   Specimen: Nasopharyngeal Swab; Nasopharyngeal(NP) swabs in vial transport medium  Result Value Ref Range Status   SARS Coronavirus 2 by RT PCR NEGATIVE NEGATIVE Final    Comment: (NOTE) SARS-CoV-2 target  nucleic acids are NOT DETECTED.  The SARS-CoV-2 RNA is generally detectable in upper respiratory specimens during the acute phase of infection. The lowest concentration of SARS-CoV-2 viral copies this assay can detect is 138 copies/mL. A negative result does not preclude SARS-Cov-2 infection and should not be used as the sole basis for treatment or other patient management decisions. A negative result may occur with  improper specimen collection/handling, submission of specimen other than nasopharyngeal swab, presence of viral mutation(s) within the areas targeted by this assay, and inadequate number of viral copies(<138 copies/mL). A negative result must be combined with clinical observations, patient history, and epidemiological information. The expected result is Negative.  Fact Sheet for Patients:  EntrepreneurPulse.com.au  Fact Sheet for Healthcare Providers:  IncredibleEmployment.be  This test is no t yet approved or cleared by the [place] FDA and  has been authorized for detection and/or diagnosis of SARS-CoV-2 by FDA under an Emergency Use Authorization (EUA). This EUA will remain  in effect (meaning this test can be used) for the duration of the COVID-19 declaration under Section 564(b)(1) of the Act, 21 U.S.C.section 360bbb-3(b)(1), unless the authorization is terminated  or revoked sooner.       Influenza A by PCR NEGATIVE NEGATIVE Final   Influenza B by PCR NEGATIVE NEGATIVE Final    Comment: (NOTE) The Xpert Xpress SARS-CoV-2/FLU/RSV plus assay is intended as an aid in the diagnosis of influenza from Nasopharyngeal swab specimens and should not be used as a sole basis for treatment. Nasal washings and aspirates are unacceptable for Xpert Xpress SARS-CoV-2/FLU/RSV testing.  Fact Sheet for Patients: EntrepreneurPulse.com.au  Fact Sheet for Healthcare  Providers: IncredibleEmployment.be  This test is not yet approved or cleared by the [place] FDA and has been authorized for detection and/or diagnosis of SARS-CoV-2 by FDA under an Emergency Use Authorization (EUA). This EUA will remain in effect (meaning this test can be used) for the duration of the COVID-19 declaration under Section 564(b)(1) of the Act, 21 U.S.C. section 360bbb-3(b)(1), unless the authorization is terminated or revoked.  Performed at [facility], [place] [address], [place], [place] [address]      Radiology Studies: No results found.  Scheduled Meds:  levothyroxine  137 mcg Oral Q0600   melatonin  5 mg Oral QHS   memantine  10 mg Oral BID   QUEtiapine  12.5 mg Oral QHS   saccharomyces boulardii  250 mg Oral Daily   sertraline  100 mg Oral QHS   spironolactone  25 mg Oral Daily   Continuous Infusions:   LOS: 2 days    Time spent: 25 mins    Shawna Clamp, MD [facility]   If 7PM-7AM, please contact night-coverage

## 2021-01-09 NOTE — TOC Progression Note (Signed)
Transition of Care [facility]) - Progression Note    Patient Details  Name: Tamara Morales MRN: [id number] Date of Birth: 1931-04-18  Transition of [facility]) CM/SW Contact  Ross Ludwig, [place] Phone Number: 01/09/2021, 5:07 PM  Clinical Narrative:    Patient has a bed available at [facility] tomorrow if medically ready and pending negative Covid test.  Covid test has been ordered.   Expected Discharge Plan: [place] Barriers to Discharge: No Barriers Identified  Expected Discharge Plan and Services Expected Discharge Plan: [place] In-house Referral: Clinical Social Work   Post Acute Care Choice: [place] Living arrangements for the past 2 months: Apartment                                       Social Determinants of Health (SDOH) Interventions    Readmission Risk Interventions No flowsheet data found.

## 2021-01-09 NOTE — Progress Notes (Signed)
   NAME:  Tamara Morales, MRN:  [id number], DOB:  [birth year], LOS: 2 ADMISSION DATE:  01/05/2021, CONSULTATION DATE:  01/05/21 REFERRING MD:  Dr Marylyn Ishihara, CHIEF COMPLAINT:  Abnormal CT chest   History of Present Illness:  87 year old lady will resides at friend's home, recently treated for urinary tract infection following ED visit on 7/16 Came back to the hospital today because she was not feeling any better Denies any chest pains, no cough, just with general malaise A chest x-ray was obtained which was abnormal leading to a CT scan of the chest being performed CT scan of the chest did reveal a left upper lobe collapse  Pertinent  Medical History   Past Medical History:  Diagnosis Date   Altered mental status    ARTHRITIS, KNEES, BILATERAL    Breast CA ([place]) 2000   s/p R lumpectomy and XRT   CHF (congestive heart failure) ([place])    DEPRESSION    DJD (degenerative joint disease) of knee    GERD    HYPERLIPIDEMIA    HYPERTENSION    HYPOTHYROIDISM    postsurgical   Hypoxia    MIGRAINE HEADACHE    Morbid obesity ([place])    OSA (obstructive sleep apnea) 01/17/2011 dx   Oxygen dependent    Urge incontinence    UTI (lower urinary tract infection) 12/2014   Significant Hospital Events: Including procedures, antibiotic start and stop dates in addition to other pertinent events   CT scan of the chest with left upper lobe collapse  Interim History / Subjective:  Feels better  Objective   Blood pressure (!) 142/88, pulse 88, temperature 97.6 F (36.4 C), temperature source Oral, resp. rate (!) 24, height $RemoveBe'5\' 5"'bGjwWHajR$  (1.651 m), weight 121.4 kg, SpO2 92 %.        Intake/Output Summary (Last 24 hours) at 01/09/2021 1214 Last data filed at 01/09/2021 0900 Gross per 24 hour  Intake 360 ml  Output 1700 ml  Net -1340 ml   Filed Weights   01/05/21 1546  Weight: 121.4 kg    Examination: General: Elderly lady, frail HENT: Moist oral mucosa Lungs: Decreased air movement  bilaterally Cardiovascular: S1-S2 appreciated  I did speak with patient's daughters-2 of them were present,another daughter FaceTimed in-we did review the CAT scans together and compared CAT scans to previous, options of interventions were discussed 7/21  Spoke at length with patient regarding discussions with daughters, discussed bronchoscopy procedure, risk with procedure, what we hope to achieve with the procedure  Patient stated today 7/24 that she does not want to go ahead with any invasive intervention following discussing with her daughters  Resolved Hospital Problem list     Assessment & Plan:  Abnormal CT scan of the chest showing left upper lobe atelectasis Left upper lobe endobronchial process Mediastinal adenopathy This is likely a neoplastic process  Recent treatment for urinary tract infection Hypertension   She has decided not to have any invasive intervention Does not desire to have a bronchoscopy Aware that this is most likely a neoplastic process-she is not having any significant symptoms at the present time but aware that it may cause symptoms with spread of the disease  Sherrilyn Rist, MD [place] [facility] Pager: See Shea Evans

## 2021-01-10 DIAGNOSIS — M6281 Muscle weakness (generalized): Secondary | ICD-10-CM | POA: Diagnosis not present

## 2021-01-10 DIAGNOSIS — R110 Nausea: Secondary | ICD-10-CM | POA: Diagnosis not present

## 2021-01-10 DIAGNOSIS — R918 Other nonspecific abnormal finding of lung field: Secondary | ICD-10-CM | POA: Diagnosis not present

## 2021-01-10 DIAGNOSIS — J9811 Atelectasis: Secondary | ICD-10-CM | POA: Diagnosis not present

## 2021-01-10 DIAGNOSIS — R0989 Other specified symptoms and signs involving the circulatory and respiratory systems: Secondary | ICD-10-CM | POA: Diagnosis not present

## 2021-01-10 DIAGNOSIS — M19011 Primary osteoarthritis, right shoulder: Secondary | ICD-10-CM | POA: Diagnosis not present

## 2021-01-10 DIAGNOSIS — R911 Solitary pulmonary nodule: Secondary | ICD-10-CM | POA: Diagnosis not present

## 2021-01-10 DIAGNOSIS — J40 Bronchitis, not specified as acute or chronic: Secondary | ICD-10-CM | POA: Diagnosis not present

## 2021-01-10 DIAGNOSIS — I5032 Chronic diastolic (congestive) heart failure: Secondary | ICD-10-CM | POA: Diagnosis not present

## 2021-01-10 DIAGNOSIS — G4733 Obstructive sleep apnea (adult) (pediatric): Secondary | ICD-10-CM | POA: Diagnosis not present

## 2021-01-10 DIAGNOSIS — Z853 Personal history of malignant neoplasm of breast: Secondary | ICD-10-CM | POA: Diagnosis not present

## 2021-01-10 DIAGNOSIS — E119 Type 2 diabetes mellitus without complications: Secondary | ICD-10-CM | POA: Diagnosis not present

## 2021-01-10 DIAGNOSIS — Z7982 Long term (current) use of aspirin: Secondary | ICD-10-CM | POA: Diagnosis not present

## 2021-01-10 DIAGNOSIS — Z9011 Acquired absence of right breast and nipple: Secondary | ICD-10-CM | POA: Diagnosis not present

## 2021-01-10 DIAGNOSIS — I10 Essential (primary) hypertension: Secondary | ICD-10-CM | POA: Diagnosis not present

## 2021-01-10 DIAGNOSIS — R0902 Hypoxemia: Secondary | ICD-10-CM | POA: Diagnosis not present

## 2021-01-10 DIAGNOSIS — E039 Hypothyroidism, unspecified: Secondary | ICD-10-CM | POA: Diagnosis not present

## 2021-01-10 DIAGNOSIS — Z79899 Other long term (current) drug therapy: Secondary | ICD-10-CM | POA: Diagnosis not present

## 2021-01-10 DIAGNOSIS — I110 Hypertensive heart disease with heart failure: Secondary | ICD-10-CM | POA: Diagnosis not present

## 2021-01-10 DIAGNOSIS — R059 Cough, unspecified: Secondary | ICD-10-CM | POA: Diagnosis not present

## 2021-01-10 DIAGNOSIS — R441 Visual hallucinations: Secondary | ICD-10-CM | POA: Diagnosis not present

## 2021-01-10 DIAGNOSIS — Z510 Encounter for antineoplastic radiation therapy: Secondary | ICD-10-CM | POA: Diagnosis not present

## 2021-01-10 DIAGNOSIS — H1132 Conjunctival hemorrhage, left eye: Secondary | ICD-10-CM | POA: Diagnosis not present

## 2021-01-10 DIAGNOSIS — D519 Vitamin B12 deficiency anemia, unspecified: Secondary | ICD-10-CM | POA: Diagnosis not present

## 2021-01-10 DIAGNOSIS — R079 Chest pain, unspecified: Secondary | ICD-10-CM | POA: Diagnosis not present

## 2021-01-10 DIAGNOSIS — I509 Heart failure, unspecified: Secondary | ICD-10-CM | POA: Diagnosis not present

## 2021-01-10 DIAGNOSIS — R1313 Dysphagia, pharyngeal phase: Secondary | ICD-10-CM | POA: Diagnosis not present

## 2021-01-10 DIAGNOSIS — F418 Other specified anxiety disorders: Secondary | ICD-10-CM | POA: Diagnosis not present

## 2021-01-10 DIAGNOSIS — N183 Chronic kidney disease, stage 3 unspecified: Secondary | ICD-10-CM | POA: Diagnosis not present

## 2021-01-10 DIAGNOSIS — R5383 Other fatigue: Secondary | ICD-10-CM | POA: Diagnosis not present

## 2021-01-10 DIAGNOSIS — R609 Edema, unspecified: Secondary | ICD-10-CM | POA: Diagnosis not present

## 2021-01-10 DIAGNOSIS — K219 Gastro-esophageal reflux disease without esophagitis: Secondary | ICD-10-CM | POA: Diagnosis not present

## 2021-01-10 DIAGNOSIS — R0789 Other chest pain: Secondary | ICD-10-CM | POA: Diagnosis not present

## 2021-01-10 DIAGNOSIS — R531 Weakness: Secondary | ICD-10-CM | POA: Diagnosis not present

## 2021-01-10 DIAGNOSIS — R600 Localized edema: Secondary | ICD-10-CM | POA: Diagnosis not present

## 2021-01-10 DIAGNOSIS — Z743 Need for continuous supervision: Secondary | ICD-10-CM | POA: Diagnosis not present

## 2021-01-10 DIAGNOSIS — Z85118 Personal history of other malignant neoplasm of bronchus and lung: Secondary | ICD-10-CM | POA: Diagnosis not present

## 2021-01-10 DIAGNOSIS — H5712 Ocular pain, left eye: Secondary | ICD-10-CM | POA: Diagnosis not present

## 2021-01-10 DIAGNOSIS — E1122 Type 2 diabetes mellitus with diabetic chronic kidney disease: Secondary | ICD-10-CM | POA: Diagnosis not present

## 2021-01-10 DIAGNOSIS — R7303 Prediabetes: Secondary | ICD-10-CM | POA: Diagnosis not present

## 2021-01-10 DIAGNOSIS — E538 Deficiency of other specified B group vitamins: Secondary | ICD-10-CM | POA: Diagnosis not present

## 2021-01-10 DIAGNOSIS — R2681 Unsteadiness on feet: Secondary | ICD-10-CM | POA: Diagnosis not present

## 2021-01-10 DIAGNOSIS — R4189 Other symptoms and signs involving cognitive functions and awareness: Secondary | ICD-10-CM | POA: Diagnosis not present

## 2021-01-10 DIAGNOSIS — Z87891 Personal history of nicotine dependence: Secondary | ICD-10-CM | POA: Diagnosis not present

## 2021-01-10 DIAGNOSIS — Z96653 Presence of artificial knee joint, bilateral: Secondary | ICD-10-CM | POA: Diagnosis not present

## 2021-01-10 DIAGNOSIS — C3412 Malignant neoplasm of upper lobe, left bronchus or lung: Secondary | ICD-10-CM | POA: Diagnosis not present

## 2021-01-10 DIAGNOSIS — R0602 Shortness of breath: Secondary | ICD-10-CM | POA: Diagnosis not present

## 2021-01-10 DIAGNOSIS — E662 Morbid (severe) obesity with alveolar hypoventilation: Secondary | ICD-10-CM | POA: Diagnosis not present

## 2021-01-10 DIAGNOSIS — R41841 Cognitive communication deficit: Secondary | ICD-10-CM | POA: Diagnosis not present

## 2021-01-10 DIAGNOSIS — E890 Postprocedural hypothyroidism: Secondary | ICD-10-CM | POA: Diagnosis not present

## 2021-01-10 DIAGNOSIS — Z9981 Dependence on supplemental oxygen: Secondary | ICD-10-CM | POA: Diagnosis not present

## 2021-01-10 DIAGNOSIS — R5381 Other malaise: Secondary | ICD-10-CM | POA: Diagnosis not present

## 2021-01-10 DIAGNOSIS — Z23 Encounter for immunization: Secondary | ICD-10-CM | POA: Diagnosis not present

## 2021-01-10 DIAGNOSIS — E785 Hyperlipidemia, unspecified: Secondary | ICD-10-CM | POA: Diagnosis not present

## 2021-01-10 DIAGNOSIS — Z20822 Contact with and (suspected) exposure to covid-19: Secondary | ICD-10-CM | POA: Diagnosis not present

## 2021-01-10 DIAGNOSIS — R29898 Other symptoms and signs involving the musculoskeletal system: Secondary | ICD-10-CM | POA: Diagnosis not present

## 2021-01-10 DIAGNOSIS — J90 Pleural effusion, not elsewhere classified: Secondary | ICD-10-CM | POA: Diagnosis not present

## 2021-01-10 DIAGNOSIS — C3492 Malignant neoplasm of unspecified part of left bronchus or lung: Secondary | ICD-10-CM | POA: Diagnosis not present

## 2021-01-10 DIAGNOSIS — J9809 Other diseases of bronchus, not elsewhere classified: Secondary | ICD-10-CM | POA: Diagnosis not present

## 2021-01-10 DIAGNOSIS — I130 Hypertensive heart and chronic kidney disease with heart failure and stage 1 through stage 4 chronic kidney disease, or unspecified chronic kidney disease: Secondary | ICD-10-CM | POA: Diagnosis not present

## 2021-01-10 DIAGNOSIS — F0151 Vascular dementia with behavioral disturbance: Secondary | ICD-10-CM | POA: Diagnosis not present

## 2021-01-10 NOTE — TOC Transition Note (Addendum)
Transition of Care [facility]) - CM/SW Discharge Note   Patient Details  Name: KIJUANA RUPPEL MRN: [id number] Date of Birth: 1930/08/31  [facility] CM/SW Contact:  Ross Ludwig, LCSW Phone Number: 01/10/2021, 1:33 PM   Clinical Narrative:     CSW attempted to contact Friend's home [place] 3 times.  CSW received phone call from admissions worker, and she said patient is actually going to [facility] and can discharge today if medically ready for discharge.  Patient to be d/c'ed today to [facility] room 34.  Patient and family agreeable to plans will transport via ems RN to call report [phone] ext [phone].     Final next level of care: Skilled Nursing Facility Barriers to Discharge: Barriers Resolved   Patient Goals and CMS Choice Patient states their goals for this hospitalization and ongoing recovery are:: To go to SNF for short term rehab then return back home. CMS Medicare.gov Compare Post Acute Care list provided to:: Patient Choice offered to / list presented to : Patient  Discharge Placement PASRR number recieved: 01/06/21            Patient chooses bed at: [place] Patient to be transferred to facility by: [facility]   Patient and family notified of of transfer: 01/10/21  Discharge Plan and Services In-house Referral: Clinical Social Work   Post Acute Care Choice: [place]                               Social Determinants of Health (SDOH) Interventions     Readmission Risk Interventions No flowsheet data found.

## 2021-01-10 NOTE — Progress Notes (Signed)
2nd attempt to call report to [facility]. LMOVM for a return call to my direct number.

## 2021-01-10 NOTE — Discharge Summary (Signed)
Physician Discharge Summary  Tamara Morales AJG:[id number] DOB: [birth year] DOA: 01/05/2021  PCP: Virgie Dad, MD  Admit date: 01/05/2021  Discharge date: 01/10/2021  Admitted From: Home.  Disposition:  SNF  [facility]  Recommendations for Outpatient Follow-up:  Follow up with PCP in 1-2 weeks. Please obtain BMP/CBC in one week. Advised to continue supplemental oxygen as needed.  Home Health:None Equipment/Devices:Oxygen @ 3L/min  Discharge Condition: Good CODE STATUS:DNR Diet recommendation: Heart Healthy  Brief [facility] course: This 87 years old female with PMH significant for dyslipidemia, hypothyroidism, hypertension presented in the ED with increased urinary frequency for about 10 days.  Patient was diagnosed with UTI and has received antibiotics for 1 week.  She has developed shortness of breath,  generalized malaise.  Because of persistent symptoms she came back in the hospital for further evaluation. Chest x-ray shows left upper lobe opacity.  CT chest showed complete opacification of left upper lobe with associated volume loss.  Left upper lobe bronchus obliterated,  features suggesting central obstructing mass.  Head CT negative for metastasis.   Patient was admitted for left bronchial obstruction with left upper lobe collapse,  Pulmonology consulted recommended diagnostic bronchoscopy , patient continued on supplemental oxygen to keep saturation above 94%.  She remained comfortable.  After detailed discussion from pulmonologist with patient and the daughters it was decided that patient does not want to have any invasive procedure or bronchoscopy.  Patient remained comfortable on 3 L of supplemental oxygen.  PT recommended skilled nursing facility.  Patient is cleared from pulmonology.  [facility] is being [facility]hab.  She was managed for below problems.  Discharge Diagnoses:  Principal Problem:   Bronchial  obstruction Active Problems:   Hypothyroidism (acquired)   Morbid obesity (HCC)   Essential hypertension   Obesity hypoventilation syndrome (HCC)   OSA (obstructive sleep apnea)   Diabetes mellitus type 2 in obese (HCC)  Acute hypoxic respiratory failure sec. to Left upper lobe collapse / atelectasis due to endobronchial lesion, likely malignancy: Patient presented with hypoxia,  87% on room air improved to 93% on 2 L. At home uses oxygen as needed.  Continue supplemental oxygen to keep saturation above 92%. CT chest showed complete opacification of left upper lobe with associated volume loss,  left upper lobe bronchus obliterated features suggesting central obstructing mass. Pulmonology consulted recommended diagnostic bronchoscopy. Bronchoscopy will not likely make patient feel better or worse but will provide [place] with a diagnosis. Need to consider her advance age, chronic debility, non ambulatory state, obesity class 3, OSA and obesity hypoventilation syndrome, that gives her a high risk for peri-procedure complications. Continue supportive care. Hold aspirin in case of bronchoscopy.  Patient reports she does not want to have a bronchoscopy. She remained comfortable on 3 L of supplemental oxygen.  Cleared from pulmonology to be discharged.   Type 2 diabetes: Continue glucose monitoring.  Fasting sugar 89. No sliding scale at this point.   Hypothyroidism: Continue levothyroxine   Hypertension: Continue spironolactone, hold on metolazone for now.   Major depression : Continue Zoloft and quetiapine.  Continue Namenda   Obesity class III: OSA obesity hypoventilation syndrome. Patient uses electric wheelchair for mobility.  Calculated BMI 44.5    Discharge Instructions  Discharge Instructions     Call MD for:  difficulty breathing, headache or visual disturbances   Complete by: As directed    Call MD for:  persistant dizziness or light-headedness   Complete by:  As directed     Call MD for:  persistant nausea and vomiting   Complete by: As directed    Diet - low sodium heart healthy   Complete by: As directed    Diet Carb Modified   Complete by: As directed    Discharge instructions   Complete by: As directed    Advised to follow-up with primary care physician in 1 week. Advised to continue supplemental oxygen is needed.   Increase activity slowly   Complete by: As directed       Allergies as of 01/10/2021       Reactions   Atorvastatin Nausea Only   Azithromycin Itching   Beta Adrenergic Blockers Other (See Comments)   Depression   Ciprofloxacin Other (See Comments)   Edgy and very jumpy    Codeine Nausea And Vomiting   Darvon [propoxyphene] Nausea And Vomiting   Epinephrine Other (See Comments)   Rapid pulse, sweats   Klonopin [clonazepam] Other (See Comments)   Makes her feel very suicidal    Oxycodone Other (See Comments)   Patient felt like it altered her mental status "Crazy" . Hallucinations later as well.   Paxil [paroxetine Hydrochloride] Other (See Comments)   Severe depression    Prednisone Other (See Comments)   "makes me feel really bad"   Propoxyphene Hcl Nausea And Vomiting   Tape Itching   Torsemide Other (See Comments)   Patient stated that she doesn't recall the reaction   Tramadol Other (See Comments)   Feels "jittery"   Vicodin [hydrocodone-acetaminophen] Other (See Comments)   Hallucinations   Meloxicam Diarrhea   Other Rash   Polyester sheets "cause me a rash"   Vancomycin Rash   Localized rash related to infusion rate        Medication List     STOP taking these medications    fluticasone 50 MCG/ACT nasal spray Commonly known as: FLONASE   ondansetron 4 MG tablet Commonly known as: ZOFRAN       TAKE these medications    aspirin 81 MG tablet Take 81 mg by mouth daily.   Citracal Petites/Vitamin D 200-250 MG-UNIT Tabs Generic drug: Calcium Citrate-Vitamin D Take 1 tablet by mouth every  morning. What changed: Another medication with the same name was removed. Continue taking this medication, and follow the directions you see here.   cyanocobalamin 2000 MCG tablet Take 2,000 mcg by mouth daily.   FIBER PO Take 1 tablet by mouth 2 (two) times daily.   melatonin 5 MG Tabs Take 1 tablet (5 mg total) by mouth at bedtime.   memantine 10 MG tablet Commonly known as: NAMENDA TAKE 1 TABLET BY MOUTH TWICE DAILY.   metolazone 2.5 MG tablet Commonly known as: ZAROXOLYN TAKE 1 TABLET FIVE DAYS A WEEK, TAKE 2 TABLETS ON MONDAY AND FRIDAY What changed: See the new instructions.   omeprazole 20 MG capsule Commonly known as: PRILOSEC TAKE 1 CAPSULE TWICE DAILY BEFORE MEALS What changed: See the new instructions.   OXYGEN Inhale 2 L into the lungs continuous. KEEP O2 SATS >= 90%. Every Shift   potassium chloride 10 MEQ CR capsule Commonly known as: MICRO-K TAKE 4 CAPSULES DAILY. What changed:  how much to take when to take this   QUEtiapine 25 MG tablet Commonly known as: SEROQUEL TAKE 1/2 TABLET AT BEDTIME.   saccharomyces boulardii 250 MG capsule Commonly known as: FLORASTOR Take 1 capsule (250 mg total) by mouth daily.   sertraline 100 MG tablet  Commonly known as: ZOLOFT TAKE ONE TABLET BY MOUTH AT BEDTIME   spironolactone 25 MG tablet Commonly known as: ALDACTONE TAKE 1 TABLET ONCE DAILY.   Synthroid 137 MCG tablet Generic drug: levothyroxine TAKE 1 TABLET ONCE DAILY BEFORE BREAKFAST. What changed: See the new instructions.        Follow-up Information     Virgie Dad, MD Follow up in 1 week(s).   Specialty: Internal Medicine Contact information: [place] [address] [phone]         Minus Breeding, MD .   Specialty: Cardiology Contact information: [address] [place] [address] [phone]                Allergies  Allergen Reactions   Atorvastatin Nausea Only   Azithromycin  Itching   Beta Adrenergic Blockers Other (See Comments)    Depression    Ciprofloxacin Other (See Comments)    Edgy and very jumpy     Codeine Nausea And Vomiting   Darvon [Propoxyphene] Nausea And Vomiting   Epinephrine Other (See Comments)    Rapid pulse, sweats   Klonopin [Clonazepam] Other (See Comments)    Makes her feel very suicidal    Oxycodone Other (See Comments)    Patient felt like it altered her mental status "Crazy" . Hallucinations later as well.   Paxil [Paroxetine Hydrochloride] Other (See Comments)    Severe depression    Prednisone Other (See Comments)    "makes me feel really bad"   Propoxyphene Hcl Nausea And Vomiting   Tape Itching   Torsemide Other (See Comments)    Patient stated that she doesn't recall the reaction   Tramadol Other (See Comments)    Feels "jittery"   Vicodin [Hydrocodone-Acetaminophen] Other (See Comments)    Hallucinations   Meloxicam Diarrhea   Other Rash    Polyester sheets "cause me a rash"   Vancomycin Rash    Localized rash related to infusion rate    Consultations: Pulmonology   Procedures/Studies: DG Chest 2 View  Result Date: 01/05/2021 CLINICAL DATA:  Malaise. EXAM: CHEST - 2 VIEW COMPARISON:  12/15/2019 FINDINGS: Hazy opacification of the left mid to upper chest with probable visualization of the left major fissure which is upward bowed. Pleural fluid is suggested on the lateral view. Milder hazy density over the right chest. Borderline heart size. Stable mediastinal contours which are distorted by scoliosis and rotation. IMPRESSION: Hazy opacification of the bilateral chest, greater on the left where there could be multi segment collapse. Consider chest CT. Electronically Signed   By: Monte Fantasia M.D.   On: 01/05/2021 04:35   CT HEAD W & WO CONTRAST  Result Date: 01/05/2021 CLINICAL DATA:  Cancer of unknown primary, staging. EXAM: CT HEAD WITHOUT AND WITH CONTRAST TECHNIQUE: Contiguous axial images were obtained  from the base of the skull through the vertex without and with intravenous contrast CONTRAST:  34mL OMNIPAQUE IOHEXOL 350 MG/ML SOLN COMPARISON:  Head MRI 02/03/2019 FINDINGS: Brain: There is no evidence of an acute infarct, intracranial hemorrhage, mass, midline shift, or extra-axial fluid collection. Mild cerebral atrophy is within normal limits for age. Patchy hypodensities in the cerebral white matter and deep gray nuclei are nonspecific but compatible with moderate chronic small vessel ischemic disease. Vascular: Calcified atherosclerosis at the skull base. Major dural venous sinuses are grossly patent. Skull: No fracture or suspicious osseous lesion. Sinuses/Orbits: Remote right orbital floor fracture. Bilateral cataract extraction. Paranasal sinuses and mastoid air  cells are clear. Other: None. IMPRESSION: 1. No evidence of intracranial metastases. 2. Moderate chronic small vessel ischemic disease. Electronically Signed   By: Logan Bores M.D.   On: 01/05/2021 10:07   CT Chest Wo Contrast  Result Date: 01/05/2021 CLINICAL DATA:  Pneumonia.  Abnormal chest x-ray. EXAM: CT CHEST WITHOUT CONTRAST TECHNIQUE: Multidetector CT imaging of the chest was performed following the standard protocol without IV contrast. COMPARISON:  Chest x-ray earlier same day.  Chest CT 08/09/2018 FINDINGS: Cardiovascular: Heart size mildly increased. Coronary artery calcification is evident. Mild atherosclerotic calcification is noted in the wall of the thoracic aorta. Mediastinum/Nodes: 1.9 cm short axis prevascular node identified on 45/2. 1.1 cm short axis identified in the AP window and there is a 1.0 cm short axis lymph node in the subcarinal station. The esophagus has normal imaging features. Surgical clips noted right axilla. There is no axillary lymphadenopathy. Upper normal lymph nodes in the left thoracic inlet measure up to 9 mm short axis. Lungs/Pleura: Volume loss with complete opacification of the left upper lobe.  Left upper lobe bronchus is obliterated. Dependent atelectasis noted left lower lobe with small left pleural effusion. No suspicious pulmonary nodule or mass in the right lung. Upper Abdomen: 5.7 cm incompletely visualized water density structure in the left upper quadrant probably represents enlargement of an exophytic left renal superior pole cyst visualized on CT stone study of 06/27/2015. Musculoskeletal: No worrisome lytic or sclerotic osseous abnormality. IMPRESSION: 1. Complete opacification of the left upper lobe with associated volume loss. Left upper lobe bronchus is obliterated. Imaging features suggest central obstructing mass lesion. CT chest with contrast may prove helpful to further delineate obstructive etiology. PET-CT could also be considered to further assess. 2. Mediastinal lymphadenopathy concerning for metastatic disease. This could also be further assessed at PET-CT. 3. Small left pleural effusion with dependent atelectasis in the left lower lobe. 4. Aortic Atherosclerosis (ICD10-I70.0). Electronically Signed   By: Misty Stanley M.D.   On: 01/05/2021 07:08   CT CHEST W CONTRAST  Result Date: 01/05/2021 CLINICAL DATA:  Cancer of unknown primary.  Staging. EXAM: CT CHEST, ABDOMEN, AND PELVIS WITH CONTRAST TECHNIQUE: Multidetector CT imaging of the chest, abdomen and pelvis was performed following the standard protocol during bolus administration of intravenous contrast. CONTRAST:  17mL OMNIPAQUE IOHEXOL 350 MG/ML SOLN COMPARISON:  Chest CT without contrast earlier same day FINDINGS: CT CHEST FINDINGS Cardiovascular: The heart size is normal. No substantial pericardial effusion. Coronary artery calcification is evident. Mild atherosclerotic calcification is noted in the wall of the thoracic aorta. Mediastinum/Nodes: As before, mediastinal lymphadenopathy noted with 1.9 cm short axis prevascular node, 1.1 cm short axis node in the AP window, and 1.0 cm short axis lymph node in the subcarinal  station. No right hilar lymphadenopathy. Although partially obscured by adjacent collapse lung, there appears to be a 5.2 x 2.9 x 4.4 cm soft tissue mass in the left suprahilar region (image 21/3), encasing and markedly attenuating pulmonary arteries to the left upper lobe and obliterating the left upper lobe bronchus. There is complete collapse of left upper lobe peripheral to this lesion. Lungs/Pleura: Collapse/consolidation of the left upper lobe is associated with some minimal dependent atelectasis in the left lower lobe and small left pleural effusion. No suspicious pulmonary nodule or mass in the right lung. Subpleural reticulation noted bilaterally suggesting underlying component of chronic interstitial lung disease. Musculoskeletal: No worrisome lytic or sclerotic osseous abnormality. Old left rib fractures noted surgical changes evident in the right  breast. CT ABDOMEN PELVIS FINDINGS Hepatobiliary: No suspicious focal abnormality within the liver parenchyma. Gallbladder is surgically absent. No intrahepatic or extrahepatic biliary dilation. Pancreas: No focal mass lesion. No dilatation of the main duct. No intraparenchymal cyst. No peripancreatic edema. Spleen: 2.7 cm hypoattenuating lesion identified anterior spleen, stable since prior chest CT of 08/09/2018 and also comparing back to a study from 07/16/2017 suggesting benign etiology. Adrenals/Urinary Tract: No adrenal nodule or mass. Central sinus cysts noted in both kidneys. No suspicious enhancing renal mass lesion. 5.5 cm exophytic cyst upper pole left kidney increased from 4.8 cm on abdomen CT 08/04/2016. 16 mm cyst posterior upper pole left kidney was 15 mm on the study from 4 years ago. No evidence for hydroureter. The urinary bladder appears normal for the degree of distention. Stomach/Bowel: Stomach is unremarkable. No gastric wall thickening. No evidence of outlet obstruction. Duodenum is normally positioned as is the ligament of Treitz. No  small bowel wall thickening. No small bowel dilatation. The terminal ileum is normal. The appendix is not well visualized, but there is no edema or inflammation in the region of the cecum. No gross colonic mass. No colonic wall thickening. Diverticular changes are noted in the left colon without evidence of diverticulitis. Vascular/Lymphatic: There is moderate atherosclerotic calcification of the abdominal aorta without aneurysm. There is no gastrohepatic or hepatoduodenal ligament lymphadenopathy. No retroperitoneal or mesenteric lymphadenopathy. No pelvic sidewall lymphadenopathy. Reproductive: Uterus surgically absent.  There is no adnexal mass. Other: No intraperitoneal free fluid. Musculoskeletal: Small supraumbilical ventral hernia contains only fat. There is a tiny umbilical hernia through which a short segment of small bowel protrudes without complicating features. No worrisome lytic or sclerotic osseous abnormality. Thoracolumbar scoliosis evident. IMPRESSION: 1. 5.2 x 2.9 x 4.4 cm soft tissue mass in the left suprahilar region encasing left upper lobe pulmonary arteries and obliterating the left upper lobe bronchus with complete collapse of the left upper lobe peripheral to this lesion. Imaging features are compatible with neoplasm. 2. Mediastinal lymphadenopathy consistent with metastatic disease. 3. No evidence for metastatic disease in the abdomen or pelvis. 4. 2.7 cm low-density splenic lesions stable since 07/08/2017 consistent with benign etiology. 5. Left colonic diverticulosis without diverticulitis. 6. Small left pleural effusion. 7. Aortic Atherosclerosis (ICD10-I70.0). Electronically Signed   By: Misty Stanley M.D.   On: 01/05/2021 10:23   CT ABDOMEN PELVIS W CONTRAST  Result Date: 01/05/2021 CLINICAL DATA:  Cancer of unknown primary.  Staging. EXAM: CT CHEST, ABDOMEN, AND PELVIS WITH CONTRAST TECHNIQUE: Multidetector CT imaging of the chest, abdomen and pelvis was performed following the  standard protocol during bolus administration of intravenous contrast. CONTRAST:  55mL OMNIPAQUE IOHEXOL 350 MG/ML SOLN COMPARISON:  Chest CT without contrast earlier same day FINDINGS: CT CHEST FINDINGS Cardiovascular: The heart size is normal. No substantial pericardial effusion. Coronary artery calcification is evident. Mild atherosclerotic calcification is noted in the wall of the thoracic aorta. Mediastinum/Nodes: As before, mediastinal lymphadenopathy noted with 1.9 cm short axis prevascular node, 1.1 cm short axis node in the AP window, and 1.0 cm short axis lymph node in the subcarinal station. No right hilar lymphadenopathy. Although partially obscured by adjacent collapse lung, there appears to be a 5.2 x 2.9 x 4.4 cm soft tissue mass in the left suprahilar region (image 21/3), encasing and markedly attenuating pulmonary arteries to the left upper lobe and obliterating the left upper lobe bronchus. There is complete collapse of left upper lobe peripheral to this lesion. Lungs/Pleura: Collapse/consolidation of the left upper  lobe is associated with some minimal dependent atelectasis in the left lower lobe and small left pleural effusion. No suspicious pulmonary nodule or mass in the right lung. Subpleural reticulation noted bilaterally suggesting underlying component of chronic interstitial lung disease. Musculoskeletal: No worrisome lytic or sclerotic osseous abnormality. Old left rib fractures noted surgical changes evident in the right breast. CT ABDOMEN PELVIS FINDINGS Hepatobiliary: No suspicious focal abnormality within the liver parenchyma. Gallbladder is surgically absent. No intrahepatic or extrahepatic biliary dilation. Pancreas: No focal mass lesion. No dilatation of the main duct. No intraparenchymal cyst. No peripancreatic edema. Spleen: 2.7 cm hypoattenuating lesion identified anterior spleen, stable since prior chest CT of 08/09/2018 and also comparing back to a study from 07/16/2017  suggesting benign etiology. Adrenals/Urinary Tract: No adrenal nodule or mass. Central sinus cysts noted in both kidneys. No suspicious enhancing renal mass lesion. 5.5 cm exophytic cyst upper pole left kidney increased from 4.8 cm on abdomen CT 08/04/2016. 16 mm cyst posterior upper pole left kidney was 15 mm on the study from 4 years ago. No evidence for hydroureter. The urinary bladder appears normal for the degree of distention. Stomach/Bowel: Stomach is unremarkable. No gastric wall thickening. No evidence of outlet obstruction. Duodenum is normally positioned as is the ligament of Treitz. No small bowel wall thickening. No small bowel dilatation. The terminal ileum is normal. The appendix is not well visualized, but there is no edema or inflammation in the region of the cecum. No gross colonic mass. No colonic wall thickening. Diverticular changes are noted in the left colon without evidence of diverticulitis. Vascular/Lymphatic: There is moderate atherosclerotic calcification of the abdominal aorta without aneurysm. There is no gastrohepatic or hepatoduodenal ligament lymphadenopathy. No retroperitoneal or mesenteric lymphadenopathy. No pelvic sidewall lymphadenopathy. Reproductive: Uterus surgically absent.  There is no adnexal mass. Other: No intraperitoneal free fluid. Musculoskeletal: Small supraumbilical ventral hernia contains only fat. There is a tiny umbilical hernia through which a short segment of small bowel protrudes without complicating features. No worrisome lytic or sclerotic osseous abnormality. Thoracolumbar scoliosis evident. IMPRESSION: 1. 5.2 x 2.9 x 4.4 cm soft tissue mass in the left suprahilar region encasing left upper lobe pulmonary arteries and obliterating the left upper lobe bronchus with complete collapse of the left upper lobe peripheral to this lesion. Imaging features are compatible with neoplasm. 2. Mediastinal lymphadenopathy consistent with metastatic disease. 3. No evidence  for metastatic disease in the abdomen or pelvis. 4. 2.7 cm low-density splenic lesions stable since 07/08/2017 consistent with benign etiology. 5. Left colonic diverticulosis without diverticulitis. 6. Small left pleural effusion. 7. Aortic Atherosclerosis (ICD10-I70.0). Electronically Signed   By: Kennith Center M.D.   On: 01/05/2021 10:23       Subjective: Patient was seen and examined at bedside.  Overnight events noted.  Patient reports feeling much improved,  She is on 3 L supplemental oxygen which is her new baseline.  She does not want to have any bronchoscopy or any invasive procedure.   Patient is being discharged to nursing home for rehab  Discharge Exam: Vitals:   01/09/21 2127 01/10/21 0443  BP:  (!) 149/82  Pulse:  92  Resp:  20  Temp:  (!) 97.3 F (36.3 C)  SpO2: 95% 94%   Vitals:   01/09/21 0549 01/09/21 2011 01/09/21 2127 01/10/21 0443  BP: (!) 142/88 139/77  (!) 149/82  Pulse: 88 92  92  Resp: (!) 24 20  20   Temp: 97.6 F (36.4 C) 98.6 F (37 C)  (!)  97.3 F (36.3 C)  TempSrc: Oral Oral  Oral  SpO2: 92% 95% 95% 94%  Weight:      Height:        General: Pt is alert, awake, not in acute distress Cardiovascular: RRR, S1/S2 +, no rubs, no gallops Respiratory: CTA bilaterally, no wheezing, no rhonchi Abdominal: Soft, NT, ND, bowel sounds + Extremities: no edema, no cyanosis    The results of significant diagnostics from this hospitalization (including imaging, microbiology, ancillary and laboratory) are listed below for reference.     Microbiology: Recent Results (from the past 240 hour(s))  Urine Culture     Status: Abnormal   Collection Time: 01/01/21  3:00 AM   Specimen: Urine, Clean Catch  Result Value Ref Range Status   Specimen Description   Final    URINE, CLEAN CATCH Performed at [facility], [place] [address] [place], [place] [address]    Special Requests   Final    NONE Performed at [facility], [place]  [address] [place], [place] [address]    Culture >=100,000 COLONIES/mL ESCHERICHIA COLI (A)  Final   Report Status 01/03/2021 FINAL  Final   Organism ID, Bacteria ESCHERICHIA COLI (A)  Final      Susceptibility   Escherichia coli - MIC*    AMPICILLIN <=2 SENSITIVE Sensitive     CEFAZOLIN <=4 SENSITIVE Sensitive     CEFEPIME <=0.12 SENSITIVE Sensitive     CEFTRIAXONE <=0.25 SENSITIVE Sensitive     CIPROFLOXACIN <=0.25 SENSITIVE Sensitive     GENTAMICIN <=1 SENSITIVE Sensitive     IMIPENEM <=0.25 SENSITIVE Sensitive     NITROFURANTOIN <=16 SENSITIVE Sensitive     TRIMETH/SULFA <=20 SENSITIVE Sensitive     AMPICILLIN/SULBACTAM <=2 SENSITIVE Sensitive     PIP/TAZO <=4 SENSITIVE Sensitive     * >=100,000 COLONIES/mL ESCHERICHIA COLI  Resp Panel by RT-PCR (Flu A&B, Covid) Nasopharyngeal Swab     Status: None   Collection Time: 01/05/21  7:27 AM   Specimen: Nasopharyngeal Swab; Nasopharyngeal(NP) swabs in vial transport medium  Result Value Ref Range Status   SARS Coronavirus 2 by RT PCR NEGATIVE NEGATIVE Final    Comment: (NOTE) SARS-CoV-2 target nucleic acids are NOT DETECTED.  The SARS-CoV-2 RNA is generally detectable in upper respiratory specimens during the acute phase of infection. The lowest concentration of SARS-CoV-2 viral copies this assay can detect is 138 copies/mL. A negative result does not preclude SARS-Cov-2 infection and should not be used as the sole basis for treatment or other patient management decisions. A negative result may occur with  improper specimen collection/handling, submission of specimen other than nasopharyngeal swab, presence of viral mutation(s) within the areas targeted by this assay, and inadequate number of viral copies(<138 copies/mL). A negative result must be combined with clinical observations, patient history, and epidemiological information. The expected result is Negative.  Fact Sheet for Patients:   EntrepreneurPulse.com.au  Fact Sheet for Healthcare Providers:  IncredibleEmployment.be  This test is no t yet approved or cleared by the [place] FDA and  has been authorized for detection and/or diagnosis of SARS-CoV-2 by FDA under an Emergency Use Authorization (EUA). This EUA will remain  in effect (meaning this test can be used) for the duration of the COVID-19 declaration under Section 564(b)(1) of the Act, 21 U.S.C.section 360bbb-3(b)(1), unless the authorization is terminated  or revoked sooner.       Influenza A by PCR NEGATIVE NEGATIVE Final   Influenza B by PCR NEGATIVE  NEGATIVE Final    Comment: (NOTE) The Xpert Xpress SARS-CoV-2/FLU/RSV plus assay is intended as an aid in the diagnosis of influenza from Nasopharyngeal swab specimens and should not be used as a sole basis for treatment. Nasal washings and aspirates are unacceptable for Xpert Xpress SARS-CoV-2/FLU/RSV testing.  Fact Sheet for Patients: EntrepreneurPulse.com.au  Fact Sheet for Healthcare Providers: IncredibleEmployment.be  This test is not yet approved or cleared by the [place] FDA and has been authorized for detection and/or diagnosis of SARS-CoV-2 by FDA under an Emergency Use Authorization (EUA). This EUA will remain in effect (meaning this test can be used) for the duration of the COVID-19 declaration under Section 564(b)(1) of the Act, 21 U.S.C. section 360bbb-3(b)(1), unless the authorization is terminated or revoked.  Performed at [facility], [place] [address] [place], [place] [address]   Resp Panel by RT-PCR (Flu A&B, Covid) Nasopharyngeal Swab     Status: None   Collection Time: 01/09/21  3:30 PM   Specimen: Nasopharyngeal Swab; Nasopharyngeal(NP) swabs in vial transport medium  Result Value Ref Range Status   SARS Coronavirus 2 by RT PCR NEGATIVE NEGATIVE Final    Comment:  (NOTE) SARS-CoV-2 target nucleic acids are NOT DETECTED.  The SARS-CoV-2 RNA is generally detectable in upper respiratory specimens during the acute phase of infection. The lowest concentration of SARS-CoV-2 viral copies this assay can detect is 138 copies/mL. A negative result does not preclude SARS-Cov-2 infection and should not be used as the sole basis for treatment or other patient management decisions. A negative result may occur with  improper specimen collection/handling, submission of specimen other than nasopharyngeal swab, presence of viral mutation(s) within the areas targeted by this assay, and inadequate number of viral copies(<138 copies/mL). A negative result must be combined with clinical observations, patient history, and epidemiological information. The expected result is Negative.  Fact Sheet for Patients:  EntrepreneurPulse.com.au  Fact Sheet for Healthcare Providers:  IncredibleEmployment.be  This test is no t yet approved or cleared by the [place] FDA and  has been authorized for detection and/or diagnosis of SARS-CoV-2 by FDA under an Emergency Use Authorization (EUA). This EUA will remain  in effect (meaning this test can be used) for the duration of the COVID-19 declaration under Section 564(b)(1) of the Act, 21 U.S.C.section 360bbb-3(b)(1), unless the authorization is terminated  or revoked sooner.       Influenza A by PCR NEGATIVE NEGATIVE Final   Influenza B by PCR NEGATIVE NEGATIVE Final    Comment: (NOTE) The Xpert Xpress SARS-CoV-2/FLU/RSV plus assay is intended as an aid in the diagnosis of influenza from Nasopharyngeal swab specimens and should not be used as a sole basis for treatment. Nasal washings and aspirates are unacceptable for Xpert Xpress SARS-CoV-2/FLU/RSV testing.  Fact Sheet for Patients: EntrepreneurPulse.com.au  Fact Sheet for Healthcare  Providers: IncredibleEmployment.be  This test is not yet approved or cleared by the [place] FDA and has been authorized for detection and/or diagnosis of SARS-CoV-2 by FDA under an Emergency Use Authorization (EUA). This EUA will remain in effect (meaning this test can be used) for the duration of the COVID-19 declaration under Section 564(b)(1) of the Act, 21 U.S.C. section 360bbb-3(b)(1), unless the authorization is terminated or revoked.  Performed at [facility], [place] [address], [place], [place] [address]      Labs: BNP (last 3 results) No results for input(s): BNP in the last 8760 hours. Basic Metabolic Panel: Recent Labs  Lab 01/05/21 0406  01/06/21 0524  NA 140 140  K 4.7 3.7  CL 98 94*  CO2 33* 35*  GLUCOSE 115* 89  BUN 26* 17  CREATININE 0.99 0.70  CALCIUM 9.1 8.9   Liver Function Tests: Recent Labs  Lab 01/06/21 0524  AST 12*  ALT 11  ALKPHOS 69  BILITOT 0.7  PROT 6.1*  ALBUMIN 3.0*   No results for input(s): LIPASE, AMYLASE in the last 168 hours. No results for input(s): AMMONIA in the last 168 hours. CBC: Recent Labs  Lab 01/05/21 0406 01/06/21 0524 01/09/21 0600  WBC 7.3 8.3 6.5  NEUTROABS 5.3  --   --   HGB 12.9 12.5 13.8  HCT 41.5 40.4 44.3  MCV 91.6 91.6 89.3  PLT 118* 110* 140*   Cardiac Enzymes: No results for input(s): CKTOTAL, CKMB, CKMBINDEX, TROPONINI in the last 168 hours. BNP: Invalid input(s): POCBNP CBG: No results for input(s): GLUCAP in the last 168 hours. D-Dimer No results for input(s): DDIMER in the last 72 hours. Hgb A1c No results for input(s): HGBA1C in the last 72 hours. Lipid Profile No results for input(s): CHOL, HDL, LDLCALC, TRIG, CHOLHDL, LDLDIRECT in the last 72 hours. Thyroid function studies No results for input(s): TSH, T4TOTAL, T3FREE, THYROIDAB in the last 72 hours.  Invalid input(s): FREET3 Anemia work up No results for input(s): VITAMINB12, FOLATE,  FERRITIN, TIBC, IRON, RETICCTPCT in the last 72 hours. Urinalysis    Component Value Date/Time   COLORURINE YELLOW 01/05/2021 0812   APPEARANCEUR CLEAR 01/05/2021 0812   LABSPEC 1.009 01/05/2021 0812   PHURINE 5.0 01/05/2021 0812   GLUCOSEU NEGATIVE 01/05/2021 0812   GLUCOSEU NEGATIVE 04/17/2012 1232   HGBUR NEGATIVE 01/05/2021 0812   BILIRUBINUR NEGATIVE 01/05/2021 0812   BILIRUBINUR neg 12/22/2016 1124   [place] 01/05/2021 0812   PROTEINUR NEGATIVE 01/05/2021 0812   UROBILINOGEN negative (A) 12/22/2016 1124   UROBILINOGEN 0.2 01/07/2015 1751   NITRITE NEGATIVE 01/05/2021 0812   LEUKOCYTESUR NEGATIVE 01/05/2021 5631   Sepsis Labs Invalid input(s): PROCALCITONIN,  WBC,  LACTICIDVEN Microbiology Recent Results (from the past 240 hour(s))  Urine Culture     Status: Abnormal   Collection Time: 01/01/21  3:00 AM   Specimen: Urine, Clean Catch  Result Value Ref Range Status   Specimen Description   Final    URINE, CLEAN CATCH Performed at [facility], [place] [address] [place], [place] [address]    Special Requests   Final    NONE Performed at [facility], [place] [address], [place], [place] [address]    Culture >=100,000 COLONIES/mL ESCHERICHIA COLI (A)  Final   Report Status 01/03/2021 FINAL  Final   Organism ID, Bacteria ESCHERICHIA COLI (A)  Final      Susceptibility   Escherichia coli - MIC*    AMPICILLIN <=2 SENSITIVE Sensitive     CEFAZOLIN <=4 SENSITIVE Sensitive     CEFEPIME <=0.12 SENSITIVE Sensitive     CEFTRIAXONE <=0.25 SENSITIVE Sensitive     CIPROFLOXACIN <=0.25 SENSITIVE Sensitive     GENTAMICIN <=1 SENSITIVE Sensitive     IMIPENEM <=0.25 SENSITIVE Sensitive     NITROFURANTOIN <=16 SENSITIVE Sensitive     TRIMETH/SULFA <=20 SENSITIVE Sensitive     AMPICILLIN/SULBACTAM <=2 SENSITIVE Sensitive     PIP/TAZO <=4 SENSITIVE Sensitive     * >=100,000 COLONIES/mL ESCHERICHIA COLI  Resp Panel by RT-PCR (Flu A&B,  Covid) Nasopharyngeal Swab     Status: None   Collection Time: 01/05/21  7:27  AM   Specimen: Nasopharyngeal Swab; Nasopharyngeal(NP) swabs in vial transport medium  Result Value Ref Range Status   SARS Coronavirus 2 by RT PCR NEGATIVE NEGATIVE Final    Comment: (NOTE) SARS-CoV-2 target nucleic acids are NOT DETECTED.  The SARS-CoV-2 RNA is generally detectable in upper respiratory specimens during the acute phase of infection. The lowest concentration of SARS-CoV-2 viral copies this assay can detect is 138 copies/mL. A negative result does not preclude SARS-Cov-2 infection and should not be used as the sole basis for treatment or other patient management decisions. A negative result may occur with  improper specimen collection/handling, submission of specimen other than nasopharyngeal swab, presence of viral mutation(s) within the areas targeted by this assay, and inadequate number of viral copies(<138 copies/mL). A negative result must be combined with clinical observations, patient history, and epidemiological information. The expected result is Negative.  Fact Sheet for Patients:  EntrepreneurPulse.com.au  Fact Sheet for Healthcare Providers:  IncredibleEmployment.be  This test is no t yet approved or cleared by the [place] FDA and  has been authorized for detection and/or diagnosis of SARS-CoV-2 by FDA under an Emergency Use Authorization (EUA). This EUA will remain  in effect (meaning this test can be used) for the duration of the COVID-19 declaration under Section 564(b)(1) of the Act, 21 U.S.C.section 360bbb-3(b)(1), unless the authorization is terminated  or revoked sooner.       Influenza A by PCR NEGATIVE NEGATIVE Final   Influenza B by PCR NEGATIVE NEGATIVE Final    Comment: (NOTE) The Xpert Xpress SARS-CoV-2/FLU/RSV plus assay is intended as an aid in the diagnosis of influenza from Nasopharyngeal swab specimens and should  not be used as a sole basis for treatment. Nasal washings and aspirates are unacceptable for Xpert Xpress SARS-CoV-2/FLU/RSV testing.  Fact Sheet for Patients: EntrepreneurPulse.com.au  Fact Sheet for Healthcare Providers: IncredibleEmployment.be  This test is not yet approved or cleared by the [place] FDA and has been authorized for detection and/or diagnosis of SARS-CoV-2 by FDA under an Emergency Use Authorization (EUA). This EUA will remain in effect (meaning this test can be used) for the duration of the COVID-19 declaration under Section 564(b)(1) of the Act, 21 U.S.C. section 360bbb-3(b)(1), unless the authorization is terminated or revoked.  Performed at [facility], [place] [address], [place], [place] [address]   Resp Panel by RT-PCR (Flu A&B, Covid) Nasopharyngeal Swab     Status: None   Collection Time: 01/09/21  3:30 PM   Specimen: Nasopharyngeal Swab; Nasopharyngeal(NP) swabs in vial transport medium  Result Value Ref Range Status   SARS Coronavirus 2 by RT PCR NEGATIVE NEGATIVE Final    Comment: (NOTE) SARS-CoV-2 target nucleic acids are NOT DETECTED.  The SARS-CoV-2 RNA is generally detectable in upper respiratory specimens during the acute phase of infection. The lowest concentration of SARS-CoV-2 viral copies this assay can detect is 138 copies/mL. A negative result does not preclude SARS-Cov-2 infection and should not be used as the sole basis for treatment or other patient management decisions. A negative result may occur with  improper specimen collection/handling, submission of specimen other than nasopharyngeal swab, presence of viral mutation(s) within the areas targeted by this assay, and inadequate number of viral copies(<138 copies/mL). A negative result must be combined with clinical observations, patient history, and epidemiological information. The expected result is Negative.  Fact Sheet  for Patients:  EntrepreneurPulse.com.au  Fact Sheet for Healthcare Providers:  IncredibleEmployment.be  This test is no t yet approved  or cleared by the [place] and  has been authorized for detection and/or diagnosis of SARS-CoV-2 by FDA under an Emergency Use Authorization (EUA). This EUA will remain  in effect (meaning this test can be used) for the duration of the COVID-19 declaration under Section 564(b)(1) of the Act, 21 U.S.C.section 360bbb-3(b)(1), unless the authorization is terminated  or revoked sooner.       Influenza A by PCR NEGATIVE NEGATIVE Final   Influenza B by PCR NEGATIVE NEGATIVE Final    Comment: (NOTE) The Xpert Xpress SARS-CoV-2/FLU/RSV plus assay is intended as an aid in the diagnosis of influenza from Nasopharyngeal swab specimens and should not be used as a sole basis for treatment. Nasal washings and aspirates are unacceptable for Xpert Xpress SARS-CoV-2/FLU/RSV testing.  Fact Sheet for Patients: EntrepreneurPulse.com.au  Fact Sheet for Healthcare Providers: IncredibleEmployment.be  This test is not yet approved or cleared by the [place] FDA and has been authorized for detection and/or diagnosis of SARS-CoV-2 by FDA under an Emergency Use Authorization (EUA). This EUA will remain in effect (meaning this test can be used) for the duration of the COVID-19 declaration under Section 564(b)(1) of the Act, 21 U.S.C. section 360bbb-3(b)(1), unless the authorization is terminated or revoked.  Performed at [facility], [place] [address], [place], [place] [address]      Time coordinating discharge: Over 30 minutes  SIGNED:   Shawna Clamp, MD  [facility] 01/10/2021, 11:39 AM Pager   If 7PM-7AM, please contact night-coverage [email or web address]

## 2021-01-10 NOTE — Progress Notes (Signed)
Attempted to call report to [facility]. [email or web address] for nursing staff to return my call.

## 2021-01-10 NOTE — Discharge Instructions (Signed)
Advised to continue supplemental oxygen is needed.

## 2021-01-10 NOTE — Progress Notes (Signed)
Patient is discharged to [facility] via [place].

## 2021-01-10 NOTE — Care Management Important Message (Signed)
Important Message  Patient Details IM Letter placed in Patient's room. Name: Tamara Morales MRN: [id number] Date of Birth: 07-19-1930   Medicare Important Message Given:  Yes     Kerin Salen 01/10/2021, 11:46 AM

## 2021-01-11 ENCOUNTER — Non-Acute Institutional Stay (SKILLED_NURSING_FACILITY): Payer: Medicare Other | Admitting: Internal Medicine

## 2021-01-11 ENCOUNTER — Encounter: Payer: Self-pay | Admitting: Internal Medicine

## 2021-01-11 DIAGNOSIS — R0902 Hypoxemia: Secondary | ICD-10-CM | POA: Diagnosis not present

## 2021-01-11 DIAGNOSIS — E785 Hyperlipidemia, unspecified: Secondary | ICD-10-CM

## 2021-01-11 DIAGNOSIS — G4733 Obstructive sleep apnea (adult) (pediatric): Secondary | ICD-10-CM

## 2021-01-11 DIAGNOSIS — R600 Localized edema: Secondary | ICD-10-CM | POA: Diagnosis not present

## 2021-01-11 DIAGNOSIS — R918 Other nonspecific abnormal finding of lung field: Secondary | ICD-10-CM

## 2021-01-11 DIAGNOSIS — E538 Deficiency of other specified B group vitamins: Secondary | ICD-10-CM | POA: Diagnosis not present

## 2021-01-11 DIAGNOSIS — I10 Essential (primary) hypertension: Secondary | ICD-10-CM

## 2021-01-11 DIAGNOSIS — E039 Hypothyroidism, unspecified: Secondary | ICD-10-CM

## 2021-01-11 DIAGNOSIS — R441 Visual hallucinations: Secondary | ICD-10-CM

## 2021-01-11 DIAGNOSIS — F418 Other specified anxiety disorders: Secondary | ICD-10-CM | POA: Diagnosis not present

## 2021-01-11 DIAGNOSIS — R7303 Prediabetes: Secondary | ICD-10-CM | POA: Diagnosis not present

## 2021-01-11 DIAGNOSIS — R4189 Other symptoms and signs involving cognitive functions and awareness: Secondary | ICD-10-CM | POA: Diagnosis not present

## 2021-01-11 NOTE — Progress Notes (Signed)
Provider:  Veleta Miners MD  Location:   [facility] Room Number: 12 Place of Service:  SNF (31)  PCP: Virgie Dad, MD Patient Care Team: Virgie Dad, MD as PCP - General (Internal Medicine) Minus Breeding, MD as PCP - Cardiology (Cardiology) Azucena Fallen, MD as Consulting Physician (Obstetrics and Gynecology) Chesley Mires, MD as Consulting Physician (Pulmonary Disease) Mast, Man X, NP as Nurse Practitioner (Internal Medicine) Christin Fudge, MD (Inactive) as Consulting Physician (Surgery)  Extended Emergency Contact Information Primary Emergency Contact: Boxman,Lee Address: [address]          [place], [place] [address] Johnnette Litter of [place] Phone: [phone] Relation: Daughter Secondary Emergency Contact: Ritta Slot [place] of [place] Mobile Phone: [phone] Relation: Daughter  Code Status:  Full code Goals of Care: Advanced Directive information Advanced Directives 01/11/2021  Does Patient Have a Medical Advance Directive? Yes  Type of Paramedic of [place];Living will;Out of facility DNR (pink MOST or yellow form)  Does patient want to make changes to medical advance directive? No - Patient declined  Copy of [place] in Chart? Yes - validated most recent copy scanned in chart (See row information)  Would patient like information on creating a medical advance directive? -  Pre-existing out of facility DNR order (yellow form or pink MOST form) Pink MOST form placed in chart (order not valid for inpatient use);Yellow form placed in chart (order not valid for inpatient use)      Chief Complaint  Patient presents with   New Admit To SNF    Admission to SNF    HPI: Patient is a 87 y.o. female seen today for admission to SNF for therapy Admitted to SNF for therapy  Admitted from 7/20-7/25 for Left Upper lobe Collapse due to Malignancy  Patient has h/o Hypertension,   Chronic LE edema with Diastolic CHF and Lymphedema, Anxiety, B12 def, OSA but does not use CPAP only Oxygen , Diabetes Type 2, Diarrhea chronic,Work up with GI negative Visual Hallucinations Work up with Neurology Possible Neurodegenerative disease   Went to ed twice for not feeling well and SOB Chest Xray showed Left Upper lobe Collapse and Mass. CT scan confirmed left bronchial obstruction with upper lobe collapse due to central obstructing mass most likely malignancy.  Patient refused to undergo bronchoscopy Her CT scan of head was negative Patient in SNF She denies any pain.  Does have cough.  Shortness of breath is better with 3 L of oxygen. She does feel weak.  Unable to do her transfers.  Appetite is.good Wants to know what would be next step Daughter in the room   Past Medical History:  Diagnosis Date   Altered mental status    ARTHRITIS, KNEES, BILATERAL    Breast CA ([facility]) 2000   s/p R lumpectomy and XRT   CHF (congestive heart failure) (HCC)    DEPRESSION    DJD (degenerative joint disease) of knee    GERD    HYPERLIPIDEMIA    HYPERTENSION    HYPOTHYROIDISM    postsurgical   Hypoxia    MIGRAINE HEADACHE    Morbid obesity (HCC)    OSA (obstructive sleep apnea) 01/17/2011 dx   Oxygen dependent    Urge incontinence    UTI (lower urinary tract infection) 12/2014   Past Surgical History:  Procedure Laterality Date   ABDOMINAL HYSTERECTOMY     ADENOIDECTOMY     BREAST BIOPSY  2000  BREAST LUMPECTOMY  2000   CATARACT EXTRACTION     CHOLECYSTECTOMY  1995   CYSTOSCOPY/URETEROSCOPY/HOLMIUM LASER Right 06/28/2015   Procedure: CYSTOSCOPY RIGHT RETROGRAD RIGHT URETEROSCOPY/HOLMIUM LASER WITH RIGHT STENT PLACEMENT;  Surgeon: Alexis Frock, MD;  Location: [facility];  Service: Urology;  Laterality: Right;   PARATHYROIDECTOMY     2-3 removed   REPLACEMENT TOTAL KNEE BILATERAL  1999, 2005   Right leg femur repaired     THYROIDECTOMY     TONSILLECTOMY     TUBAL LIGATION       reports that she quit smoking about 46 years ago. Her smoking use included cigarettes. She has a 0.50 pack-year smoking history. She has never used smokeless tobacco. She reports that she does not drink alcohol and does not use drugs. Social History   Socioeconomic History   Marital status: Married    Spouse name: Not on file   Number of children: 5   Years of education: some college   Highest education level: Not on file  Occupational History   Occupation: RETIRED    Employer: RETIRED    Comment: worked on office  Tobacco Use   Smoking status: Former    Packs/day: 0.25    Years: 2.00    Pack years: 0.50    Types: Cigarettes    Quit date: 06/19/1974    Years since quitting: 46.5   Smokeless tobacco: Never   Tobacco comments:    Quit in late 20's  Vaping Use   Vaping Use: Never used  Substance and Sexual Activity   Alcohol use: No    Alcohol/week: 0.0 standard drinks   Drug use: No   Sexual activity: Never  Other Topics Concern   Not on file  Social History Narrative   Married to husband that has dementia (he lives in the skilled nursing wing) and this is a big stressor. 5 children. 10 grandchildren. 2 greatgrandchildren. All children [place]      Lives at [facility], in independent living.      Retired from multiple different Community education officer, university, Psychologist, educational.       Hobbies: Management consultant, write poetry, craft dolls      Right-handed.      No daily caffeine use.   Social Determinants of Health   Financial Resource Strain: Not on file  Food Insecurity: Not on file  Transportation Needs: Not on file  Physical Activity: Not on file  Stress: Not on file  Social Connections: Not on file  Intimate Partner Violence: Not on file    Functional Status Survey:    Family History  Problem Relation Age of Onset   Heart disease Mother    Heart disease Father    Emphysema Sister    Coronary artery disease Neg Hx     Health Maintenance  Topic Date Due    URINE MICROALBUMIN  Never done   FOOT EXAM  06/19/2020   OPHTHALMOLOGY EXAM  07/20/2020   INFLUENZA VACCINE  01/17/2021   HEMOGLOBIN A1C  05/27/2021   TETANUS/TDAP  11/13/2028   COVID-19 Vaccine  Completed   PNA vac Low Risk Adult  Completed   Zoster Vaccines- Shingrix  Completed   HPV VACCINES  Aged Out   DEXA SCAN  Discontinued    Allergies  Allergen Reactions   Atorvastatin Nausea Only   Azithromycin Itching   Beta Adrenergic Blockers Other (See Comments)    Depression    Ciprofloxacin Other (See Comments)    Edgy and very jumpy  Codeine Nausea And Vomiting   Darvon [Propoxyphene] Nausea And Vomiting   Epinephrine Other (See Comments)    Rapid pulse, sweats   Klonopin [Clonazepam] Other (See Comments)    Makes her feel very suicidal    Oxycodone Other (See Comments)    Patient felt like it altered her mental status "Crazy" . Hallucinations later as well.   Paxil [Paroxetine Hydrochloride] Other (See Comments)    Severe depression    Prednisone Other (See Comments)    "makes me feel really bad"   Propoxyphene Hcl Nausea And Vomiting   Tape Itching   Torsemide Other (See Comments)    Patient stated that she doesn't recall the reaction   Tramadol Other (See Comments)    Feels "jittery"   Vicodin [Hydrocodone-Acetaminophen] Other (See Comments)    Hallucinations   Meloxicam Diarrhea   Other Rash    Polyester sheets "cause me a rash"   Vancomycin Rash    Localized rash related to infusion rate    Allergies as of 01/11/2021       Reactions   Atorvastatin Nausea Only   Azithromycin Itching   Beta Adrenergic Blockers Other (See Comments)   Depression   Ciprofloxacin Other (See Comments)   Edgy and very jumpy    Codeine Nausea And Vomiting   Darvon [propoxyphene] Nausea And Vomiting   Epinephrine Other (See Comments)   Rapid pulse, sweats   Klonopin [clonazepam] Other (See Comments)   Makes her feel very suicidal    Oxycodone Other (See Comments)    Patient felt like it altered her mental status "Crazy" . Hallucinations later as well.   Paxil [paroxetine Hydrochloride] Other (See Comments)   Severe depression    Prednisone Other (See Comments)   "makes me feel really bad"   Propoxyphene Hcl Nausea And Vomiting   Tape Itching   Torsemide Other (See Comments)   Patient stated that she doesn't recall the reaction   Tramadol Other (See Comments)   Feels "jittery"   Vicodin [hydrocodone-acetaminophen] Other (See Comments)   Hallucinations   Meloxicam Diarrhea   Other Rash   Polyester sheets "cause me a rash"   Vancomycin Rash   Localized rash related to infusion rate        Medication List        Accurate as of January 11, 2021  9:46 AM. If you have any questions, ask your nurse or doctor.          aspirin 81 MG tablet Take 81 mg by mouth daily.   Citracal Petites/Vitamin D 200-250 MG-UNIT Tabs Generic drug: Calcium Citrate-Vitamin D Take 1 tablet by mouth every morning.   cyanocobalamin 2000 MCG tablet Take 2,000 mcg by mouth daily.   FIBER PO Take 1 tablet by mouth 2 (two) times daily.   melatonin 5 MG Tabs Take 1 tablet (5 mg total) by mouth at bedtime.   memantine 10 MG tablet Commonly known as: NAMENDA TAKE 1 TABLET BY MOUTH TWICE DAILY.   metolazone 2.5 MG tablet Commonly known as: ZAROXOLYN TAKE 1 TABLET FIVE DAYS A WEEK, TAKE 2 TABLETS ON MONDAY AND FRIDAY   omeprazole 20 MG capsule Commonly known as: PRILOSEC TAKE 1 CAPSULE TWICE DAILY BEFORE MEALS   OXYGEN Inhale 2 L into the lungs continuous. KEEP O2 SATS >= 90%. Every Shift   potassium chloride 10 MEQ CR capsule Commonly known as: MICRO-K TAKE 4 CAPSULES DAILY.   QUEtiapine 25 MG tablet Commonly known as: SEROQUEL TAKE 1/2 TABLET AT  BEDTIME.   saccharomyces boulardii 250 MG capsule Commonly known as: FLORASTOR Take 1 capsule (250 mg total) by mouth daily.   sertraline 100 MG tablet Commonly known as: ZOLOFT TAKE ONE TABLET BY  MOUTH AT BEDTIME   spironolactone 25 MG tablet Commonly known as: ALDACTONE TAKE 1 TABLET ONCE DAILY.   Synthroid 137 MCG tablet Generic drug: levothyroxine TAKE 1 TABLET ONCE DAILY BEFORE BREAKFAST.        Review of Systems Review of Systems  Constitutional: Negative for activity change, appetite change, chills, diaphoresis, fatigue and fever.  HENT: Negative for mouth sores, postnasal drip, rhinorrhea, sinus pain and sore throat.   Respiratory: Negative for apnea, cough, chest tightness, shortness of breath and wheezing.   Cardiovascular: Negative for chest pain, palpitations and leg swelling.  Gastrointestinal: Negative for abdominal distention, abdominal pain, constipation, diarrhea, nausea and vomiting.  Genitourinary: Negative for dysuria and frequency.  Musculoskeletal: Negative for arthralgias, joint swelling and myalgias.  Skin: Negative for rash.  Neurological: Negative for dizziness, syncope, weakness, light-headedness and numbness.  Psychiatric/Behavioral: Negative for behavioral problems, confusion and sleep disturbance.    Vitals:   01/11/21 0930  BP: 140/70  Pulse: 80  Resp: 20  Temp: (!) 97.5 F (36.4 C)  SpO2: 98%  Weight: 271 lb 12.8 oz (123.3 kg)  Height: $Remove'5\' 2"'[id number]$  (1.575 m)   Body mass index is 49.71 kg/m. Physical Exam Vitals reviewed.  Constitutional:      Appearance: She is obese.  HENT:     Head: Normocephalic.     Nose: Nose normal.     Mouth/Throat:     Mouth: Mucous membranes are moist.     Pharynx: Oropharynx is clear.  Eyes:     Pupils: Pupils are equal, round, and reactive to light.  Cardiovascular:     Rate and Rhythm: Normal rate and regular rhythm.  Pulmonary:     Effort: Pulmonary effort is normal. No respiratory distress.     Breath sounds: Normal breath sounds. No wheezing.  Abdominal:     General: Abdomen is flat. Bowel sounds are normal.     Palpations: Abdomen is soft.  Musculoskeletal:        General: No swelling.      Cervical back: Neck supple.  Skin:    General: Skin is warm.  Neurological:     General: No focal deficit present.     Mental Status: She is alert and oriented to person, place, and time.  Psychiatric:        Mood and Affect: Mood normal.        Thought Content: Thought content normal.    Labs reviewed: Basic Metabolic Panel: Recent Labs    04/01/20 0000 11/25/20 0740 01/01/21 0157 01/05/21 0406 01/06/21 0524  NA 140   < > 139 140 140  K 4.1   < > 3.9 4.7 3.7  CL 101   < > 100 98 94*  CO2 31   < > 29 33* 35*  GLUCOSE 78   < > 107* 115* 89  BUN 13   < > 19 26* 17  CREATININE 0.98*   < > 1.11* 0.99 0.70  CALCIUM 8.5*   < > 8.7* 9.1 8.9  MG 1.5  --   --   --   --    < > = values in this interval not displayed.   Liver Function Tests: Recent Labs    04/01/20 0000 11/25/20 0740 01/06/21 0524  AST 13 12 12*  ALT '8 7 11  '$ ALKPHOS  --   --  69  BILITOT 0.6 0.4 0.7  PROT 6.1 6.2 6.1*  ALBUMIN  --   --  3.0*   No results for input(s): LIPASE, AMYLASE in the last 8760 hours. No results for input(s): AMMONIA in the last 8760 hours. CBC: Recent Labs    11/25/20 0740 01/01/21 0157 01/05/21 0406 01/06/21 0524 01/09/21 0600  WBC 6.7 8.0 7.3 8.3 6.5  NEUTROABS 4,456 5.7 5.3  --   --   HGB 13.3 13.9 12.9 12.5 13.8  HCT 41.2 44.7 41.5 40.4 44.3  MCV 87.7 89.8 91.6 91.6 89.3  PLT 116* 129* 118* 110* 140*   Cardiac Enzymes: No results for input(s): CKTOTAL, CKMB, CKMBINDEX, TROPONINI in the last 8760 hours. BNP: Invalid input(s): POCBNP Lab Results  Component Value Date   HGBA1C 6.1 (H) 11/25/2020   Lab Results  Component Value Date   TSH 4.59 (H) 11/25/2020   Lab Results  Component Value Date   VITAMINB12 1,156 (H) 10/14/2019   No results found for: FOLATE No results found for: IRON, TIBC, FERRITIN  Imaging and Procedures obtained prior to SNF admission: DG Chest 2 View  Result Date: 01/05/2021 CLINICAL DATA:  Malaise. EXAM: CHEST - 2 VIEW COMPARISON:   12/15/2019 FINDINGS: Hazy opacification of the left mid to upper chest with probable visualization of the left major fissure which is upward bowed. Pleural fluid is suggested on the lateral view. Milder hazy density over the right chest. Borderline heart size. Stable mediastinal contours which are distorted by scoliosis and rotation. IMPRESSION: Hazy opacification of the bilateral chest, greater on the left where there could be multi segment collapse. Consider chest CT. Electronically Signed   By: Monte Fantasia M.D.   On: 01/05/2021 04:35   CT HEAD W & WO CONTRAST  Result Date: 01/05/2021 CLINICAL DATA:  Cancer of unknown primary, staging. EXAM: CT HEAD WITHOUT AND WITH CONTRAST TECHNIQUE: Contiguous axial images were obtained from the base of the skull through the vertex without and with intravenous contrast CONTRAST:  6mL OMNIPAQUE IOHEXOL 350 MG/ML SOLN COMPARISON:  Head MRI 02/03/2019 FINDINGS: Brain: There is no evidence of an acute infarct, intracranial hemorrhage, mass, midline shift, or extra-axial fluid collection. Mild cerebral atrophy is within normal limits for age. Patchy hypodensities in the cerebral white matter and deep gray nuclei are nonspecific but compatible with moderate chronic small vessel ischemic disease. Vascular: Calcified atherosclerosis at the skull base. Major dural venous sinuses are grossly patent. Skull: No fracture or suspicious osseous lesion. Sinuses/Orbits: Remote right orbital floor fracture. Bilateral cataract extraction. Paranasal sinuses and mastoid air cells are clear. Other: None. IMPRESSION: 1. No evidence of intracranial metastases. 2. Moderate chronic small vessel ischemic disease. Electronically Signed   By: Logan Bores M.D.   On: 01/05/2021 10:07   CT Chest Wo Contrast  Result Date: 01/05/2021 CLINICAL DATA:  Pneumonia.  Abnormal chest x-ray. EXAM: CT CHEST WITHOUT CONTRAST TECHNIQUE: Multidetector CT imaging of the chest was performed following the  standard protocol without IV contrast. COMPARISON:  Chest x-ray earlier same day.  Chest CT 08/09/2018 FINDINGS: Cardiovascular: Heart size mildly increased. Coronary artery calcification is evident. Mild atherosclerotic calcification is noted in the wall of the thoracic aorta. Mediastinum/Nodes: 1.9 cm short axis prevascular node identified on 45/2. 1.1 cm short axis identified in the AP window and there is a 1.0 cm short axis lymph node in the subcarinal station. The esophagus has normal imaging features. Surgical clips  noted right axilla. There is no axillary lymphadenopathy. Upper normal lymph nodes in the left thoracic inlet measure up to 9 mm short axis. Lungs/Pleura: Volume loss with complete opacification of the left upper lobe. Left upper lobe bronchus is obliterated. Dependent atelectasis noted left lower lobe with small left pleural effusion. No suspicious pulmonary nodule or mass in the right lung. Upper Abdomen: 5.7 cm incompletely visualized water density structure in the left upper quadrant probably represents enlargement of an exophytic left renal superior pole cyst visualized on CT stone study of 06/27/2015. Musculoskeletal: No worrisome lytic or sclerotic osseous abnormality. IMPRESSION: 1. Complete opacification of the left upper lobe with associated volume loss. Left upper lobe bronchus is obliterated. Imaging features suggest central obstructing mass lesion. CT chest with contrast may prove helpful to further delineate obstructive etiology. PET-CT could also be considered to further assess. 2. Mediastinal lymphadenopathy concerning for metastatic disease. This could also be further assessed at PET-CT. 3. Small left pleural effusion with dependent atelectasis in the left lower lobe. 4. Aortic Atherosclerosis (ICD10-I70.0). Electronically Signed   By: Misty Stanley M.D.   On: 01/05/2021 07:08   CT CHEST W CONTRAST  Result Date: 01/05/2021 CLINICAL DATA:  Cancer of unknown primary.  Staging.  EXAM: CT CHEST, ABDOMEN, AND PELVIS WITH CONTRAST TECHNIQUE: Multidetector CT imaging of the chest, abdomen and pelvis was performed following the standard protocol during bolus administration of intravenous contrast. CONTRAST:  28mL OMNIPAQUE IOHEXOL 350 MG/ML SOLN COMPARISON:  Chest CT without contrast earlier same day FINDINGS: CT CHEST FINDINGS Cardiovascular: The heart size is normal. No substantial pericardial effusion. Coronary artery calcification is evident. Mild atherosclerotic calcification is noted in the wall of the thoracic aorta. Mediastinum/Nodes: As before, mediastinal lymphadenopathy noted with 1.9 cm short axis prevascular node, 1.1 cm short axis node in the AP window, and 1.0 cm short axis lymph node in the subcarinal station. No right hilar lymphadenopathy. Although partially obscured by adjacent collapse lung, there appears to be a 5.2 x 2.9 x 4.4 cm soft tissue mass in the left suprahilar region (image 21/3), encasing and markedly attenuating pulmonary arteries to the left upper lobe and obliterating the left upper lobe bronchus. There is complete collapse of left upper lobe peripheral to this lesion. Lungs/Pleura: Collapse/consolidation of the left upper lobe is associated with some minimal dependent atelectasis in the left lower lobe and small left pleural effusion. No suspicious pulmonary nodule or mass in the right lung. Subpleural reticulation noted bilaterally suggesting underlying component of chronic interstitial lung disease. Musculoskeletal: No worrisome lytic or sclerotic osseous abnormality. Old left rib fractures noted surgical changes evident in the right breast. CT ABDOMEN PELVIS FINDINGS Hepatobiliary: No suspicious focal abnormality within the liver parenchyma. Gallbladder is surgically absent. No intrahepatic or extrahepatic biliary dilation. Pancreas: No focal mass lesion. No dilatation of the main duct. No intraparenchymal cyst. No peripancreatic edema. Spleen: 2.7 cm  hypoattenuating lesion identified anterior spleen, stable since prior chest CT of 08/09/2018 and also comparing back to a study from 07/16/2017 suggesting benign etiology. Adrenals/Urinary Tract: No adrenal nodule or mass. Central sinus cysts noted in both kidneys. No suspicious enhancing renal mass lesion. 5.5 cm exophytic cyst upper pole left kidney increased from 4.8 cm on abdomen CT 08/04/2016. 16 mm cyst posterior upper pole left kidney was 15 mm on the study from 4 years ago. No evidence for hydroureter. The urinary bladder appears normal for the degree of distention. Stomach/Bowel: Stomach is unremarkable. No gastric wall thickening. No evidence of  outlet obstruction. Duodenum is normally positioned as is the ligament of Treitz. No small bowel wall thickening. No small bowel dilatation. The terminal ileum is normal. The appendix is not well visualized, but there is no edema or inflammation in the region of the cecum. No gross colonic mass. No colonic wall thickening. Diverticular changes are noted in the left colon without evidence of diverticulitis. Vascular/Lymphatic: There is moderate atherosclerotic calcification of the abdominal aorta without aneurysm. There is no gastrohepatic or hepatoduodenal ligament lymphadenopathy. No retroperitoneal or mesenteric lymphadenopathy. No pelvic sidewall lymphadenopathy. Reproductive: Uterus surgically absent.  There is no adnexal mass. Other: No intraperitoneal free fluid. Musculoskeletal: Small supraumbilical ventral hernia contains only fat. There is a tiny umbilical hernia through which a short segment of small bowel protrudes without complicating features. No worrisome lytic or sclerotic osseous abnormality. Thoracolumbar scoliosis evident. IMPRESSION: 1. 5.2 x 2.9 x 4.4 cm soft tissue mass in the left suprahilar region encasing left upper lobe pulmonary arteries and obliterating the left upper lobe bronchus with complete collapse of the left upper lobe peripheral  to this lesion. Imaging features are compatible with neoplasm. 2. Mediastinal lymphadenopathy consistent with metastatic disease. 3. No evidence for metastatic disease in the abdomen or pelvis. 4. 2.7 cm low-density splenic lesions stable since 07/08/2017 consistent with benign etiology. 5. Left colonic diverticulosis without diverticulitis. 6. Small left pleural effusion. 7. Aortic Atherosclerosis (ICD10-I70.0). Electronically Signed   By: Misty Stanley M.D.   On: 01/05/2021 10:23   CT ABDOMEN PELVIS W CONTRAST  Result Date: 01/05/2021 CLINICAL DATA:  Cancer of unknown primary.  Staging. EXAM: CT CHEST, ABDOMEN, AND PELVIS WITH CONTRAST TECHNIQUE: Multidetector CT imaging of the chest, abdomen and pelvis was performed following the standard protocol during bolus administration of intravenous contrast. CONTRAST:  45mL OMNIPAQUE IOHEXOL 350 MG/ML SOLN COMPARISON:  Chest CT without contrast earlier same day FINDINGS: CT CHEST FINDINGS Cardiovascular: The heart size is normal. No substantial pericardial effusion. Coronary artery calcification is evident. Mild atherosclerotic calcification is noted in the wall of the thoracic aorta. Mediastinum/Nodes: As before, mediastinal lymphadenopathy noted with 1.9 cm short axis prevascular node, 1.1 cm short axis node in the AP window, and 1.0 cm short axis lymph node in the subcarinal station. No right hilar lymphadenopathy. Although partially obscured by adjacent collapse lung, there appears to be a 5.2 x 2.9 x 4.4 cm soft tissue mass in the left suprahilar region (image 21/3), encasing and markedly attenuating pulmonary arteries to the left upper lobe and obliterating the left upper lobe bronchus. There is complete collapse of left upper lobe peripheral to this lesion. Lungs/Pleura: Collapse/consolidation of the left upper lobe is associated with some minimal dependent atelectasis in the left lower lobe and small left pleural effusion. No suspicious pulmonary nodule or  mass in the right lung. Subpleural reticulation noted bilaterally suggesting underlying component of chronic interstitial lung disease. Musculoskeletal: No worrisome lytic or sclerotic osseous abnormality. Old left rib fractures noted surgical changes evident in the right breast. CT ABDOMEN PELVIS FINDINGS Hepatobiliary: No suspicious focal abnormality within the liver parenchyma. Gallbladder is surgically absent. No intrahepatic or extrahepatic biliary dilation. Pancreas: No focal mass lesion. No dilatation of the main duct. No intraparenchymal cyst. No peripancreatic edema. Spleen: 2.7 cm hypoattenuating lesion identified anterior spleen, stable since prior chest CT of 08/09/2018 and also comparing back to a study from 07/16/2017 suggesting benign etiology. Adrenals/Urinary Tract: No adrenal nodule or mass. Central sinus cysts noted in both kidneys. No suspicious enhancing renal mass lesion.  5.5 cm exophytic cyst upper pole left kidney increased from 4.8 cm on abdomen CT 08/04/2016. 16 mm cyst posterior upper pole left kidney was 15 mm on the study from 4 years ago. No evidence for hydroureter. The urinary bladder appears normal for the degree of distention. Stomach/Bowel: Stomach is unremarkable. No gastric wall thickening. No evidence of outlet obstruction. Duodenum is normally positioned as is the ligament of Treitz. No small bowel wall thickening. No small bowel dilatation. The terminal ileum is normal. The appendix is not well visualized, but there is no edema or inflammation in the region of the cecum. No gross colonic mass. No colonic wall thickening. Diverticular changes are noted in the left colon without evidence of diverticulitis. Vascular/Lymphatic: There is moderate atherosclerotic calcification of the abdominal aorta without aneurysm. There is no gastrohepatic or hepatoduodenal ligament lymphadenopathy. No retroperitoneal or mesenteric lymphadenopathy. No pelvic sidewall lymphadenopathy.  Reproductive: Uterus surgically absent.  There is no adnexal mass. Other: No intraperitoneal free fluid. Musculoskeletal: Small supraumbilical ventral hernia contains only fat. There is a tiny umbilical hernia through which a short segment of small bowel protrudes without complicating features. No worrisome lytic or sclerotic osseous abnormality. Thoracolumbar scoliosis evident. IMPRESSION: 1. 5.2 x 2.9 x 4.4 cm soft tissue mass in the left suprahilar region encasing left upper lobe pulmonary arteries and obliterating the left upper lobe bronchus with complete collapse of the left upper lobe peripheral to this lesion. Imaging features are compatible with neoplasm. 2. Mediastinal lymphadenopathy consistent with metastatic disease. 3. No evidence for metastatic disease in the abdomen or pelvis. 4. 2.7 cm low-density splenic lesions stable since 07/08/2017 consistent with benign etiology. 5. Left colonic diverticulosis without diverticulitis. 6. Small left pleural effusion. 7. Aortic Atherosclerosis (ICD10-I70.0). Electronically Signed   By: Misty Stanley M.D.   On: 01/05/2021 10:23    Assessment/Plan  Lung mass Most Likely Malignant Discussed with the patient and the daughter in the room Patient states that she would like to know her treatment options. She is very open to chemo and radiation therapy She wants to make sure her quality of life is maintained. Will order Oncology Consult Right now she is comfortable with no pain Hypoxia On 3 L of oxygen Essential hypertension On Aldactone  Hallucination, visual On low-dose of Seroquel Previous work-up has been negative Prediabetes A1c stable Hypothyroidism (acquired) TSH slightly elevated will follow in 3 months Depression with anxiety On Zoloft OSA (obstructive sleep apnea) Continue CPAP Cognitive impairment Continue Namenda Her memory and MMSE in the past has been 30 out of 30. MRI showed generalized atrophy   Vitamin B12  deficiency Good levels Bilateral leg edema Doing well on Metolazone and Aldactone Did not tolerate Lasix and Toresimide  Family/ staff Communication:   Labs/tests ordered: All her Labs were in Good limits Will not order new labs today

## 2021-01-12 ENCOUNTER — Encounter: Payer: Self-pay | Admitting: Nurse Practitioner

## 2021-01-12 DIAGNOSIS — R918 Other nonspecific abnormal finding of lung field: Secondary | ICD-10-CM | POA: Insufficient documentation

## 2021-01-17 ENCOUNTER — Other Ambulatory Visit: Payer: Self-pay

## 2021-01-17 ENCOUNTER — Encounter (HOSPITAL_COMMUNITY): Payer: Self-pay | Admitting: *Deleted

## 2021-01-17 ENCOUNTER — Emergency Department (HOSPITAL_COMMUNITY): Payer: Medicare Other

## 2021-01-17 ENCOUNTER — Emergency Department (HOSPITAL_COMMUNITY)
Admission: EM | Admit: 2021-01-17 | Discharge: 2021-01-17 | Disposition: A | Discharge status: Home / Self Care | Payer: Medicare Other | Attending: Emergency Medicine | Admitting: Emergency Medicine

## 2021-01-17 DIAGNOSIS — Z87891 Personal history of nicotine dependence: Secondary | ICD-10-CM | POA: Diagnosis not present

## 2021-01-17 DIAGNOSIS — Z20822 Contact with and (suspected) exposure to covid-19: Secondary | ICD-10-CM | POA: Diagnosis not present

## 2021-01-17 DIAGNOSIS — E039 Hypothyroidism, unspecified: Secondary | ICD-10-CM | POA: Diagnosis not present

## 2021-01-17 DIAGNOSIS — I130 Hypertensive heart and chronic kidney disease with heart failure and stage 1 through stage 4 chronic kidney disease, or unspecified chronic kidney disease: Secondary | ICD-10-CM | POA: Diagnosis not present

## 2021-01-17 DIAGNOSIS — Z7982 Long term (current) use of aspirin: Secondary | ICD-10-CM | POA: Insufficient documentation

## 2021-01-17 DIAGNOSIS — R059 Cough, unspecified: Secondary | ICD-10-CM | POA: Diagnosis not present

## 2021-01-17 DIAGNOSIS — N183 Chronic kidney disease, stage 3 unspecified: Secondary | ICD-10-CM | POA: Insufficient documentation

## 2021-01-17 DIAGNOSIS — E1122 Type 2 diabetes mellitus with diabetic chronic kidney disease: Secondary | ICD-10-CM | POA: Insufficient documentation

## 2021-01-17 DIAGNOSIS — R0602 Shortness of breath: Secondary | ICD-10-CM | POA: Insufficient documentation

## 2021-01-17 DIAGNOSIS — R0789 Other chest pain: Secondary | ICD-10-CM | POA: Diagnosis not present

## 2021-01-17 DIAGNOSIS — R110 Nausea: Secondary | ICD-10-CM | POA: Diagnosis not present

## 2021-01-17 DIAGNOSIS — Z96653 Presence of artificial knee joint, bilateral: Secondary | ICD-10-CM | POA: Insufficient documentation

## 2021-01-17 DIAGNOSIS — Z853 Personal history of malignant neoplasm of breast: Secondary | ICD-10-CM | POA: Insufficient documentation

## 2021-01-17 DIAGNOSIS — R079 Chest pain, unspecified: Secondary | ICD-10-CM | POA: Diagnosis not present

## 2021-01-17 DIAGNOSIS — I5032 Chronic diastolic (congestive) heart failure: Secondary | ICD-10-CM | POA: Diagnosis not present

## 2021-01-17 DIAGNOSIS — Z743 Need for continuous supervision: Secondary | ICD-10-CM | POA: Diagnosis not present

## 2021-01-17 DIAGNOSIS — Z79899 Other long term (current) drug therapy: Secondary | ICD-10-CM | POA: Insufficient documentation

## 2021-01-17 DIAGNOSIS — R918 Other nonspecific abnormal finding of lung field: Secondary | ICD-10-CM | POA: Diagnosis not present

## 2021-01-17 DIAGNOSIS — R0902 Hypoxemia: Secondary | ICD-10-CM | POA: Diagnosis not present

## 2021-01-17 DIAGNOSIS — J90 Pleural effusion, not elsewhere classified: Secondary | ICD-10-CM | POA: Diagnosis not present

## 2021-01-17 DIAGNOSIS — R5381 Other malaise: Secondary | ICD-10-CM | POA: Diagnosis not present

## 2021-01-17 DIAGNOSIS — I10 Essential (primary) hypertension: Secondary | ICD-10-CM | POA: Diagnosis not present

## 2021-01-17 LAB — BASIC METABOLIC PANEL
Anion gap: 8 (ref 5–15)
BUN: 14 mg/dL (ref 8–23)
CO2: 33 mmol/L — ABNORMAL HIGH (ref 22–32)
Calcium: 8.9 mg/dL (ref 8.9–10.3)
Chloride: 95 mmol/L — ABNORMAL LOW (ref 98–111)
Creatinine, Ser: 0.9 mg/dL (ref 0.44–1.00)
GFR, Estimated: >60 mL/min (ref >60)
Glucose, Bld: 102 mg/dL — ABNORMAL HIGH (ref 70–99)
Potassium: 3.7 mmol/L (ref 3.5–5.1)
Sodium: 136 mmol/L (ref 135–145)

## 2021-01-17 LAB — RESP PANEL BY RT-PCR (FLU A&B, COVID) ARPGX2
Influenza A by PCR: NEGATIVE
Influenza B by PCR: NEGATIVE
SARS Coronavirus 2 by RT PCR: NEGATIVE

## 2021-01-17 LAB — TROPONIN I (HIGH SENSITIVITY)
Troponin I (High Sensitivity): 9 ng/L (ref <18)
Troponin I (High Sensitivity): 10 ng/L (ref <18)

## 2021-01-17 LAB — CBC
HCT: 46.8 % — ABNORMAL HIGH (ref 36.0–46.0)
Hemoglobin: 15 g/dL (ref 12.0–15.0)
MCH: 28.6 pg (ref 26.0–34.0)
MCHC: 32.1 g/dL (ref 30.0–36.0)
MCV: 89.1 fL (ref 80.0–100.0)
Platelets: UNDETERMINED 10*3/uL (ref 150–400)
RBC: 5.25 MIL/uL — ABNORMAL HIGH (ref 3.87–5.11)
RDW: 17.4 % — ABNORMAL HIGH (ref 11.5–15.5)
WBC: 8.4 10*3/uL (ref 4.0–10.5)
nRBC: 0 % (ref 0.0–0.2)

## 2021-01-17 LAB — BRAIN NATRIURETIC PEPTIDE: B Natriuretic Peptide: 59.5 pg/mL (ref 0.0–100.0)

## 2021-01-17 MED ORDER — IOHEXOL 350 MG/ML SOLN
80.0000 mL | Freq: Once | INTRAVENOUS | Status: AC | PRN
  Administered 2021-01-17: 80 mL via INTRAVENOUS

## 2021-01-17 NOTE — ED Notes (Signed)
Placed pur-wick on patient

## 2021-01-17 NOTE — ED Notes (Signed)
Pt gone to CT 

## 2021-01-17 NOTE — ED Triage Notes (Signed)
Patient presents to ed via [facility] states she woke up with chest pressure non-radiating, c/o productive cough at times. States she is on 3L/[place] at home, [facility] and was placed in the nursing home side.

## 2021-01-17 NOTE — ED Notes (Signed)
Pt is back from CT scan

## 2021-01-17 NOTE — ED Notes (Signed)
Attempted to call report 3 different times and was transferred to 3 different numbers per facility operator and no one answered the phone all 3 times.

## 2021-01-17 NOTE — Discharge Instructions (Addendum)
Your CT scan was negative for any blood clots in your lungs.  Your cardiac enzymes were reassuringly normal.  Your work-up today was without any new concerning findings however you do have a redemonstrated large lung mass that will need to be further evaluated by pulmonology.  Please continue taking her home medications and using her oxygen as prescribed/recommended.  Please return to the ER for any other new or concerning symptoms.

## 2021-01-17 NOTE — ED Notes (Signed)
Called lab to have BNP added to previous sample. Per lab they will add on BNP

## 2021-01-17 NOTE — ED Provider Notes (Signed)
[facility] EMERGENCY DEPARTMENT Provider Note   CSN: [id number] Arrival date & time: 01/17/21  0633     History Chief Complaint  Patient presents with   Chest Pain   Shortness of Breath    Tamara Morales is a 87 y.o. female.  HPI Patient is an 87 year old female with past medical history significant for OSA, chronic oxygen use, CHF most recent echocardiogram nearly exactly 3 years ago showed an EF of 65-70%.  HTN, HLD, obesity, hypothyroidism.  Patient was just discharged from the hospital 7/25 and went home with a diagnosis of lung mass.  She will be following up with pulmonology and anticipate a bronchoscopy with biopsy at some point.  Patient states that approximately 4 AM this morning she woke up with a tightness in her chest.  She states that she feels short of breath however has difficulty saying whether this is more profound shortness of breath than her normal. She denies any chest pain or shortness of breath currently.  States that she does have some chest tightness.  Denies any nausea or vomiting.  No lightheadedness or dizziness.  No episodes of passing out.  She gets around by transitioning into a power chair and using that to navigate.  She has a history of VTE.  She is not a smoker although she has smoked approximately 20 years ago for approximately 10 years.  Denies any fevers.  States that she is coughing sometimes that is nonpurulent but it is productive of somewhat yellowish tinged sputum.    Denies any aggravating or mitigating factors.  No other associate symptoms.  Denies any lower extremity swelling or calf pain or unilateral leg swelling.    Past Medical History:  Diagnosis Date   Altered mental status    ARTHRITIS, KNEES, BILATERAL    Breast CA ([place]) 2000   s/p R lumpectomy and XRT   CHF (congestive heart failure) ([place])    DEPRESSION    DJD (degenerative joint disease) of knee    GERD    HYPERLIPIDEMIA    HYPERTENSION     HYPOTHYROIDISM    postsurgical   Hypoxia    MIGRAINE HEADACHE    Morbid obesity ([facility])    OSA (obstructive sleep apnea) 01/17/2011 dx   Oxygen dependent    Urge incontinence    UTI (lower urinary tract infection) 12/2014    Patient Active Problem List   Diagnosis Date Noted   Lung mass 01/12/2021   Bronchial obstruction 01/05/2021   Rotator cuff tendinitis, left 05/06/2020   Rectal bleed 10/09/2019   Vascular dementia ([place]) 10/02/2019   Rash in adult 10/02/2019   Visual hallucinations 12/31/2018   Weight loss 12/10/2018   Allergic rhinitis 12/06/2018   Left ankle pain 12/06/2018   Hypocalcemia 12/02/2018   Fall 11/15/2018   Gait abnormality 11/15/2018   Hallucination, visual 11/14/2018   Confusion 11/10/2018   Generalized weakness 11/06/2018   Chronic diastolic CHF (congestive heart failure) ([place]) 10/29/2018   Vitamin B12 deficiency 05/23/2018   Anxiety 04/23/2018   Chronic respiratory failure with hypoxia ([place]) 04/23/2018   Acute on chronic diastolic (congestive) heart failure ([place]) 03/14/2018   Diabetes mellitus type 2 in obese ([place]) 03/12/2018   Lymphedema 11/20/2017   Counseling regarding advanced care planning and goals of care 09/20/2017   Hypomagnesemia 07/17/2017   Prediabetes 07/03/2017   Hyperlipidemia LDL goal <70 07/03/2017   Post-nasal drip 06/27/2017   CKD (chronic kidney disease) stage 3, GFR 30-59 ml/min ([place]) 05/22/2017  Bilateral dry eyes 04/20/2017   Hemorrhoids 04/09/2017   Chronic constipation 03/30/2017   Weight gain 12/07/2016   Neck pain on left side 10/05/2016   Chronic left shoulder pain 08/10/2016   Numbness and tingling of right leg 04/06/2016   IBS (irritable bowel syndrome) 11/18/2015   Hypokalemia 07/07/2015   Acid reflux 07/05/2015   UTI (urinary tract infection) 07/02/2015   Ureterolithiasis    Depression with anxiety 06/27/2015   History of breast cancer 06/27/2015   OSA (obstructive sleep apnea)    Obesity hypoventilation  syndrome ([place]) 11/27/2014   Diarrhea 08/21/2013   Dyspnea 03/13/2013   Cough 03/05/2010   Morbid obesity ([place]) 10/04/2009   Hypothyroidism (acquired) 08/05/2009   Essential hypertension 08/05/2009    Past Surgical History:  Procedure Laterality Date   ABDOMINAL HYSTERECTOMY     ADENOIDECTOMY     BREAST BIOPSY  2000   BREAST LUMPECTOMY  2000   CATARACT EXTRACTION     CHOLECYSTECTOMY  1995   CYSTOSCOPY/URETEROSCOPY/HOLMIUM LASER Right 06/28/2015   Procedure: CYSTOSCOPY RIGHT RETROGRAD RIGHT URETEROSCOPY/HOLMIUM LASER WITH RIGHT STENT PLACEMENT;  Surgeon: Alexis Frock, MD;  Location: [facility];  Service: Urology;  Laterality: Right;   PARATHYROIDECTOMY     2-3 removed   REPLACEMENT TOTAL KNEE BILATERAL  1999, 2005   Right leg femur repaired     THYROIDECTOMY     TONSILLECTOMY     TUBAL LIGATION       OB History     Gravida  5   Para  5   Term      Preterm      AB      Living         SAB      IAB      Ectopic      Multiple      Live Births              Family History  Problem Relation Age of Onset   Heart disease Mother    Heart disease Father    Emphysema Sister    Coronary artery disease Neg Hx     Social History   Tobacco Use   Smoking status: Former    Packs/day: 0.25    Years: 2.00    Pack years: 0.50    Types: Cigarettes    Quit date: 06/19/1974    Years since quitting: 46.6   Smokeless tobacco: Never   Tobacco comments:    Quit in late 20's  Vaping Use   Vaping Use: Never used  Substance Use Topics   Alcohol use: No    Alcohol/week: 0.0 standard drinks   Drug use: No    Home Medications Prior to Admission medications   Medication Sig Start Date End Date Taking? Authorizing Provider  alum & mag hydroxide-simeth (MINTOX) 200-200-20 MG/5ML suspension Take 30 mLs by mouth every 4 (four) hours as needed for indigestion or heartburn (indigestion).   Yes [provider]  aspirin 81 MG tablet Take 81 mg by mouth daily.   Yes  [provider]  CITRACAL PETITES/VITAMIN D 200-250 MG-UNIT TABS Take 1 tablet by mouth every morning. 11/17/20  Yes [provider]  cyanocobalamin 2000 MCG tablet Take 2,000 mcg by mouth daily.   Yes [provider]  FIBER PO Take 1 tablet by mouth 2 (two) times daily.   Yes [provider]  Melatonin 5 MG TABS Take 1 tablet (5 mg total) by mouth at bedtime. 02/11/19  Yes  Mahlon Gammon, MD  memantine (NAMENDA) 10 MG tablet TAKE 1 TABLET BY MOUTH TWICE DAILY. 11/05/20  Yes Mahlon Gammon, MD  metolazone (ZAROXOLYN) 2.5 MG tablet TAKE 1 TABLET FIVE DAYS A WEEK, TAKE 2 TABLETS ON MONDAY AND FRIDAY 11/05/20  Yes Mahlon Gammon, MD  omeprazole (PRILOSEC) 20 MG capsule TAKE 1 CAPSULE TWICE DAILY BEFORE MEALS 08/16/20  Yes Mahlon Gammon, MD  potassium chloride (MICRO-K) 10 MEQ CR capsule TAKE 4 CAPSULES DAILY. 08/16/20  Yes Mahlon Gammon, MD  QUEtiapine (SEROQUEL) 25 MG tablet TAKE 1/2 TABLET AT BEDTIME. 08/16/20  Yes Mahlon Gammon, MD  saccharomyces boulardii (FLORASTOR) 250 MG capsule Take 1 capsule (250 mg total) by mouth daily. 02/11/19  Yes Mahlon Gammon, MD  sertraline (ZOLOFT) 100 MG tablet TAKE ONE TABLET BY MOUTH AT BEDTIME 01/07/21  Yes Mahlon Gammon, MD  spironolactone (ALDACTONE) 25 MG tablet TAKE 1 TABLET ONCE DAILY. 08/16/20  Yes Mahlon Gammon, MD  SYNTHROID 137 MCG tablet TAKE 1 TABLET ONCE DAILY BEFORE BREAKFAST. 12/31/20  Yes Mahlon Gammon, MD  OXYGEN Inhale 2 L into the lungs continuous. KEEP O2 SATS >= 90%. Every Shift    [provider]    Allergies    Atorvastatin, Azithromycin, Beta adrenergic blockers, Ciprofloxacin, Codeine, Darvon [propoxyphene], Epinephrine, Klonopin [clonazepam], Oxycodone, Paxil [paroxetine hydrochloride], Prednisone, Propoxyphene hcl, Tape, Torsemide, Tramadol, Vicodin [hydrocodone-acetaminophen], Meloxicam, Other, and Vancomycin  Review of Systems   Review of Systems  Constitutional:  Negative for  chills and fever.  HENT:  Negative for congestion.   Eyes:  Negative for pain.  Respiratory:  Positive for chest tightness and shortness of breath. Negative for cough.   Cardiovascular:  Negative for chest pain and leg swelling.  Gastrointestinal:  Positive for nausea (intermittent - none today). Negative for abdominal distention, abdominal pain, diarrhea and vomiting.  Genitourinary:  Negative for dysuria.  Musculoskeletal:  Negative for myalgias.  Skin:  Negative for rash.  Neurological:  Negative for dizziness and headaches.   Physical Exam Updated Vital Signs BP 139/67   Pulse 79   Temp 97.6 F (36.4 C) (Oral)   Resp 16   Ht 5\' 6"  (1.676 m)   Wt 127 kg   SpO2 95%   BMI 45.19 kg/m   Physical Exam Vitals and nursing note reviewed.  Constitutional:      General: She is not in acute distress.    Comments: Pleasant well-appearing 87 year old female in no acute distress.  Answers questions appropriately follow commands.  HENT:     Head: Normocephalic and atraumatic.     Nose: Nose normal.  Eyes:     General: No scleral icterus. Cardiovascular:     Rate and Rhythm: Normal rate and regular rhythm.     Pulses: Normal pulses.     Heart sounds: Normal heart sounds.  Pulmonary:     Effort: No respiratory distress.     Breath sounds: Rales present. No wheezing.     Comments: Faint crackles auscultated in left lower lobe.  Speaking in full sentences.  No tachypnea noted.  Wearing 3 L nasal cannula oxygen. Chest:     Chest wall: No tenderness.  Abdominal:     Palpations: Abdomen is soft.     Tenderness: There is no abdominal tenderness.  Musculoskeletal:     Cervical back: Normal range of motion.     Right lower leg: No edema.     Left lower leg: No edema.  Comments: No lower extremity edema or calf tenderness.  Skin:    General: Skin is warm and dry.     Capillary Refill: Capillary refill takes less than 2 seconds.  Neurological:     Mental Status: She is alert.  Mental status is at baseline.  Psychiatric:        Mood and Affect: Mood normal.        Behavior: Behavior normal.    ED Results / Procedures / Treatments   Labs (all labs ordered are listed, but only abnormal results are displayed) Labs Reviewed  BASIC METABOLIC PANEL - Abnormal; Notable for the following components:      Result Value   Chloride 95 (*)    CO2 33 (*)    Glucose, Bld 102 (*)    All other components within normal limits  CBC - Abnormal; Notable for the following components:   RBC 5.25 (*)    HCT 46.8 (*)    RDW 17.4 (*)    All other components within normal limits  RESP PANEL BY RT-PCR (FLU A&B, COVID) [id number]  BRAIN NATRIURETIC PEPTIDE  TROPONIN I (HIGH SENSITIVITY)  TROPONIN I (HIGH SENSITIVITY)    EKG EKG Interpretation  Date/Time:  Monday January 17 2021 06:41:02 EDT Ventricular Rate:  89 PR Interval:  160 QRS Duration: 80 QT Interval:  340 QTC Calculation: 413 R Axis:   -11 Text Interpretation: Normal sinus rhythm Normal ECG no sig change from previous Confirmed by Charlesetta Shanks [phone]) on 01/17/2021 9:10:23 AM  Radiology DG Chest 2 View  Result Date: 01/17/2021 CLINICAL DATA:  87 year old female with chest pressure and shortness of breath. EXAM: CHEST - 2 VIEW COMPARISON:  Chest x-ray 01/05/2021. FINDINGS: Persistent masslike density in the central aspect of the left upper lobe with associated postobstructive airspace consolidation in the left upper lobe. Right lung is clear. No pleural effusions. No pneumothorax. No evidence of pulmonary edema. Heart size is normal. The patient is rotated to the left on today's exam, resulting in distortion of the mediastinal contours and reduced diagnostic sensitivity and specificity for mediastinal pathology. Atherosclerotic calcifications in the thoracic aorta. IMPRESSION: 1. Persistent central left upper lobe mass with postobstructive atelectasis/consolidation in the left upper lobe. Findings remain highly concerning  for primary bronchogenic neoplasm. 2. Aortic atherosclerosis. Electronically Signed   By: Vinnie Langton M.D.   On: 01/17/2021 07:30   CT Angio Chest PE W/Cm &/Or Wo Cm  Result Date: 01/17/2021 CLINICAL DATA:  Shortness of breath EXAM: CT ANGIOGRAPHY CHEST WITH CONTRAST TECHNIQUE: Multidetector CT imaging of the chest was performed using the standard protocol during bolus administration of intravenous contrast. Multiplanar CT image reconstructions and MIPs were obtained to evaluate the vascular anatomy. CONTRAST:  45mL OMNIPAQUE IOHEXOL 350 MG/ML SOLN COMPARISON:  01/05/2021 FINDINGS: Cardiovascular: No filling defects in the pulmonary arteries to suggest pulmonary emboli. Heart is borderline in size. Scattered aortic and coronary artery calcifications. Mediastinum/Nodes: Mediastinal and left hilar adenopathy again noted, unchanged. Index AP window lymph node has a short axis diameter of 11 mm, stable. AP window/left hilar lymph node has a short axis diameter of 19 mm on image 32, stable. Lungs/Pleura: Soft tissue mass in the left suprahilar region again noted, encasing the left pulmonary artery and causing occlusion of the left upper lobe bronchus. Continued complete collapse of the left upper lobe. Small left pleural effusion. Findings are stable since recent study. No confluent opacity on the right. Upper Abdomen: Imaging into the upper abdomen demonstrates no acute findings.  Musculoskeletal: Chest wall soft tissues are unremarkable. No acute bony abnormality. Degenerative changes throughout the thoracic spine. Review of the MIP images confirms the above findings. IMPRESSION: Stable left suprahilar mass encasing the left pulmonary artery and left upper lobe bronchus with obliteration of the left upper lobe bronchus and complete collapse of the left upper lobe. Findings compatible with neoplasm. Stable so stated mediastinal and left hilar adenopathy. Small left pleural effusion. No evidence of pulmonary  embolus. Aortic Atherosclerosis (ICD10-I70.0). Electronically Signed   By: Rolm Baptise M.D.   On: 01/17/2021 10:05    Procedures Procedures   Medications Ordered in ED Medications  iohexol (OMNIPAQUE) 350 MG/ML injection 80 mL (80 mLs Intravenous Contrast Given 01/17/21 0953)    ED Course  I have reviewed the triage vital signs and the nursing notes.  Pertinent labs & imaging results that were available during my care of the patient were reviewed by me and considered in my medical decision making (see chart for details).  Clinical Course as of 01/17/21 1450  Mon Jan 17, 2021  1208 COVID and influenza is negative.  BMP unremarkable some mild CO2 retention which is consistent with prior ER visits and patient's history of OSA.  CBC unremarkable.  Platelets clustered however she has had normal platelet counts in the past.   [WF]  1209 Troponins flat  BNP unremarkable.  Patient does not appear fluid overloaded. [WF]  1209 I discussed this case with my attending physician who cosigned this note including patient's presenting symptoms, physical exam, and planned diagnostics and interventions. Attending physician stated agreement with plan or made changes to plan which were implemented.   Attending physician assessed patient at bedside.   [WF]    Clinical Course User Index [WF] Tedd Sias, [place]   Patient is 87 year old female presented today with chest tightness and some shortness of breath seems that most of her symptoms have resolved at this point and her physical exam is reassuring however given her age/recent hospitalization/chronic O2 use -- which could mask a PE-induced hypoxia -- and recent tumor discovery I believe she is high risk for PE and will obtain CT scan.  This will also be helpful for evaluating for PNA.   MDM Rules/Calculators/A&P                           Doubt PE as patient denies pleuritic component of CP, denies history of clot, active cancer,  immobilization, surgery, hemoptysis, known clotting disorder, is on no estrogen containing medications, denies calf pain and on physical exam has no unilateral leg swelling, calf TTP, tachycardia or hypoxia.   Doubt ACS as patient has (HLD, HTN, obesity, smoking minus) risk factors. Patient's CP is atypical for ACS. Is non-radiating, substernal, not described as chest pressure and is not precipitated/worsened by exertion.   Doubt thoracic aortic dissection as pain lacks tearing or severe quality and does not radiate to back. Patient denies nausea, diaphoresis. Symmetric pulses, no murmur, no neurologic deficit. CXR without widened mediastinum.   Doubt pericarditis as no recent illness, PE without muffled heart sounds or friction rub, CP is non-pleuritic, EKG without global STE or PR depression.  Pt re-evaluated. Daughter at bedside.  Overall reassuring workup.  Assessed by my attending who agrees with my plan to DC home.  Pt is agreeable and asymptomatic at this time.   Sx very well may be related to large tumor/anxiety/reflux or other unrelated condition. Strict return precautions given.  Final Clinical Impression(s) / ED Diagnoses Final diagnoses:  Atypical chest pain    Rx / DC Orders ED Discharge Orders     None        Tedd Sias, [place] 01/17/21 1459    Charlesetta Shanks, MD 01/18/21 1235

## 2021-01-17 NOTE — ED Notes (Signed)
[facility] at bedside to transport pt to facility. Notified PTAR that attemtps were made to give report unsuccessfully.

## 2021-01-17 NOTE — ED Provider Notes (Signed)
I provided a substantive portion of the care of this patient.  I personally performed the entirety of the history for this encounter.  EKG Interpretation  Date/Time:  Monday January 17 2021 06:41:02 EDT Ventricular Rate:  89 PR Interval:  160 QRS Duration: 80 QT Interval:  340 QTC Calculation: 413 R Axis:   -11 Text Interpretation: Normal sinus rhythm Normal ECG no sig change from previous Confirmed by Charlesetta Shanks [phone]) on 01/17/2021 9:10:23 AM   She reports she awakened in the early hours the morning with a tightness in her chest and feeling short of breath.  No fevers no increased cough.  Patient had recent diagnosis of a lung mass concerning for neoplastic disease.  Patient is alert.  Nontoxic.  New left subconjunctival hemorrhage.[name]  Heart regular.  No respiratory distress at rest.  Anterior auscultation, good airflow on the right, soft breath sounds upper lung fields on the left.  No significant peripheral edema.  Calf soft.  I agree with plan of management.   Charlesetta Shanks, MD 01/17/21 1019

## 2021-01-18 ENCOUNTER — Encounter: Payer: Self-pay | Admitting: Internal Medicine

## 2021-01-18 ENCOUNTER — Non-Acute Institutional Stay (SKILLED_NURSING_FACILITY): Payer: Medicare Other | Admitting: Internal Medicine

## 2021-01-18 DIAGNOSIS — R0602 Shortness of breath: Secondary | ICD-10-CM

## 2021-01-18 DIAGNOSIS — H1132 Conjunctival hemorrhage, left eye: Secondary | ICD-10-CM | POA: Diagnosis not present

## 2021-01-18 DIAGNOSIS — H5712 Ocular pain, left eye: Secondary | ICD-10-CM | POA: Diagnosis not present

## 2021-01-18 DIAGNOSIS — R918 Other nonspecific abnormal finding of lung field: Secondary | ICD-10-CM | POA: Diagnosis not present

## 2021-01-18 NOTE — Progress Notes (Signed)
Location: [facility] Room Number: 50 Place of Service:  SNF [phone])  Provider: Veleta Miners MD  Code Status: Full Code Goals of Care:  Advanced Directives 01/18/2021  Does Patient Have a Medical Advance Directive? Yes  Type of [employer] of [place];Living will;Out of facility DNR (pink MOST or yellow form)  Does patient want to make changes to medical advance directive? No - Patient declined  Copy of [place] in Chart? Yes - validated most recent copy scanned in chart (See row information)  Would patient like information on creating a medical advance directive? -  Pre-existing out of facility DNR order (yellow form or pink MOST form) Pink MOST form placed in chart (order not valid for inpatient use);Yellow form placed in chart (order not valid for inpatient use)     Chief Complaint  Patient presents with   Acute Visit    SOB and Red eye    HPI: Patient is a 87 y.o. female seen today for an acute visit for SOB and red Eye  Admitted from 7/20-7/25 for Left Upper lobe Collapse due to Malignancy   Patient has h/o Hypertension,  Chronic LE edema with Diastolic CHF and Lymphedema, Anxiety, B12 def, OSA but does not use CPAP only Oxygen , Diabetes Type 2, Diarrhea chronic,Work up with GI negative Visual Hallucinations Work up with Neurology Possible Neurodegenerative disease  Was send again to ED yesterday as Patient says She could not breadth CT scan of chest did not show any Acute change Back in SNF Breathing better. No cough on 2 litre's of Oxygen Also Noticed to have left Eye Red and Bruise below the Eye Denies any fall Pain or injury Vision is good    Past Medical History:  Diagnosis Date   Altered mental status    ARTHRITIS, KNEES, BILATERAL    Breast CA ([facility]) 2000   s/p R lumpectomy and XRT   CHF (congestive heart failure) (HCC)    DEPRESSION    DJD (degenerative joint disease) of knee    GERD     HYPERLIPIDEMIA    HYPERTENSION    HYPOTHYROIDISM    postsurgical   Hypoxia    MIGRAINE HEADACHE    Morbid obesity (HCC)    OSA (obstructive sleep apnea) 01/17/2011 dx   Oxygen dependent    Urge incontinence    UTI (lower urinary tract infection) 12/2014    Past Surgical History:  Procedure Laterality Date   ABDOMINAL HYSTERECTOMY     ADENOIDECTOMY     BREAST BIOPSY  2000   BREAST LUMPECTOMY  2000   CATARACT EXTRACTION     CHOLECYSTECTOMY  1995   CYSTOSCOPY/URETEROSCOPY/HOLMIUM LASER Right 06/28/2015   Procedure: CYSTOSCOPY RIGHT RETROGRAD RIGHT URETEROSCOPY/HOLMIUM LASER WITH RIGHT STENT PLACEMENT;  Surgeon: Alexis Frock, MD;  Location: [facility];  Service: Urology;  Laterality: Right;   PARATHYROIDECTOMY     2-3 removed   REPLACEMENT TOTAL KNEE BILATERAL  1999, 2005   Right leg femur repaired     THYROIDECTOMY     TONSILLECTOMY     TUBAL LIGATION      Allergies  Allergen Reactions   Atorvastatin Nausea Only   Azithromycin Itching   Beta Adrenergic Blockers Other (See Comments)    Depression    Ciprofloxacin Other (See Comments)    Edgy and very jumpy     Codeine Nausea And Vomiting   Darvon [Propoxyphene] Nausea And Vomiting   Epinephrine Other (See Comments)  Rapid pulse, sweats   Klonopin [Clonazepam] Other (See Comments)    Makes her feel very suicidal    Oxycodone Other (See Comments)    Patient felt like it altered her mental status "Crazy" . Hallucinations later as well.   Paxil [Paroxetine Hydrochloride] Other (See Comments)    Severe depression    Prednisone Other (See Comments)    "makes me feel really bad"   Propoxyphene Hcl Nausea And Vomiting   Tape Itching   Torsemide Other (See Comments)    Patient stated that she doesn't recall the reaction   Tramadol Other (See Comments)    Feels "jittery"   Vicodin [Hydrocodone-Acetaminophen] Other (See Comments)    Hallucinations   Meloxicam Diarrhea   Other Rash    Polyester sheets "cause me a rash"    Vancomycin Rash    Localized rash related to infusion rate    Outpatient Encounter Medications as of 01/18/2021  Medication Sig   alum & mag hydroxide-simeth (MAALOX/MYLANTA) 200-200-20 MG/5ML suspension Take 30 mLs by mouth every 4 (four) hours as needed for indigestion or heartburn (indigestion).   aspirin 81 MG tablet Take 81 mg by mouth daily.   CITRACAL PETITES/VITAMIN D 200-250 MG-UNIT TABS Take 1 tablet by mouth every morning.   cyanocobalamin 2000 MCG tablet Take 2,000 mcg by mouth daily.   FIBER PO Take 1 tablet by mouth 2 (two) times daily.   Melatonin 5 MG TABS Take 1 tablet (5 mg total) by mouth at bedtime.   memantine (NAMENDA) 10 MG tablet TAKE 1 TABLET BY MOUTH TWICE DAILY.   metolazone (ZAROXOLYN) 2.5 MG tablet TAKE 1 TABLET FIVE DAYS A WEEK, TAKE 2 TABLETS ON MONDAY AND FRIDAY   omeprazole (PRILOSEC) 20 MG capsule TAKE 1 CAPSULE TWICE DAILY BEFORE MEALS   OXYGEN Inhale 2 L into the lungs continuous. KEEP O2 SATS >= 90%. Every Shift   potassium chloride (MICRO-K) 10 MEQ CR capsule TAKE 4 CAPSULES DAILY.   QUEtiapine (SEROQUEL) 25 MG tablet TAKE 1/2 TABLET AT BEDTIME.   saccharomyces boulardii (FLORASTOR) 250 MG capsule Take 1 capsule (250 mg total) by mouth daily.   sertraline (ZOLOFT) 100 MG tablet TAKE ONE TABLET BY MOUTH AT BEDTIME   spironolactone (ALDACTONE) 25 MG tablet TAKE 1 TABLET ONCE DAILY.   SYNTHROID 137 MCG tablet TAKE 1 TABLET ONCE DAILY BEFORE BREAKFAST.   No facility-administered encounter medications on file as of 01/18/2021.    Review of Systems:  Review of Systems  Constitutional:  Positive for activity change.  HENT: Negative.    Eyes:  Positive for redness. Negative for photophobia, pain, discharge and itching.  Respiratory:  Positive for cough and shortness of breath.   Cardiovascular:  Positive for leg swelling.  Gastrointestinal: Negative.   Genitourinary: Negative.   Musculoskeletal:  Positive for gait problem.  Skin: Negative.    Psychiatric/Behavioral: Negative.     Health Maintenance  Topic Date Due   URINE MICROALBUMIN  Never done   FOOT EXAM  06/19/2020   OPHTHALMOLOGY EXAM  07/20/2020   INFLUENZA VACCINE  01/17/2021   HEMOGLOBIN A1C  05/27/2021   TETANUS/TDAP  11/13/2028   COVID-19 Vaccine  Completed   PNA vac Low Risk Adult  Completed   Zoster Vaccines- Shingrix  Completed   HPV VACCINES  Aged Out   DEXA SCAN  Discontinued    Physical Exam: Vitals:   01/18/21 1527  BP: (!) 145/83  Pulse: 89  Resp: 20  Temp: 97.9 F (36.6 C)  SpO2:  95%  Weight: 256 lb 9.6 oz (116.4 kg)  Height: 5\' 8"  (1.727 m)   Body mass index is 39.02 kg/m. Physical Exam Constitutional: Oriented to person, place, and time. Well-developed and well-nourished.  HENT:  Head: Normocephalic.  Mouth/Throat: Oropharynx is clear and moist.  Eyes: Pupils are equal, round, and reactive to light. Left Eye has Conjunctival Hemorrhage with Bruise below it. NO Pain or swelling Neck: Neck supple.  Cardiovascular: Normal rate and normal heart sounds.  No murmur heard. Pulmonary/Chest: . Has Wheezing in Left Lung Decrease Air movement Bilateral Abdominal: Soft. Bowel sounds are normal. No distension. There is no tenderness. There is no rebound.  Musculoskeletal: Mild Edema Bilateral Lymphadenopathy: none Neurological: Alert and oriented to person, place, and time.  Skin: Skin is warm and dry.  Psychiatric: Normal mood and affect. Behavior is normal. Thought content normal.   Labs reviewed: Basic Metabolic Panel: Recent Labs    04/01/20 0000 11/25/20 0740 01/01/21 0157 01/05/21 0406 01/06/21 0524 01/17/21 0737  NA 140 142   < > 140 140 136  K 4.1 4.1   < > 4.7 3.7 3.7  CL 101 101   < > 98 94* 95*  CO2 31 32   < > 33* 35* 33*  GLUCOSE 78 91   < > 115* 89 102*  BUN 13 23   < > 26* 17 14  CREATININE 0.98* 1.16*   < > 0.99 0.70 0.90  CALCIUM 8.5* 8.7   < > 9.1 8.9 8.9  MG 1.5  --   --   --   --   --   TSH  --  4.59*  --    --   --   --    < > = values in this interval not displayed.   Liver Function Tests: Recent Labs    04/01/20 0000 11/25/20 0740 01/06/21 0524  AST 13 12 12*  ALT 8 7 11   ALKPHOS  --   --  69  BILITOT 0.6 0.4 0.7  PROT 6.1 6.2 6.1*  ALBUMIN  --   --  3.0*   No results for input(s): LIPASE, AMYLASE in the last 8760 hours. No results for input(s): AMMONIA in the last 8760 hours. CBC: Recent Labs    11/25/20 0740 01/01/21 0157 01/05/21 0406 01/06/21 0524 01/09/21 0600 01/17/21 0737  WBC 6.7 8.0 7.3 8.3 6.5 8.4  NEUTROABS 4,456 5.7 5.3  --   --   --   HGB 13.3 13.9 12.9 12.5 13.8 15.0  HCT 41.2 44.7 41.5 40.4 44.3 46.8*  MCV 87.7 89.8 91.6 91.6 89.3 89.1  PLT 116* 129* 118* 110* 140* PLATELET CLUMPS NOTED ON SMEAR, UNABLE TO ESTIMATE   Lipid Panel: Recent Labs    04/01/20 0000  CHOL 145  HDL 56  LDLCALC 71  TRIG 95  CHOLHDL 2.6   Lab Results  Component Value Date   HGBA1C 6.1 (H) 11/25/2020    Procedures since last visit: DG Chest 2 View  Result Date: 01/17/2021 CLINICAL DATA:  87 year old female with chest pressure and shortness of breath. EXAM: CHEST - 2 VIEW COMPARISON:  Chest x-ray 01/05/2021. FINDINGS: Persistent masslike density in the central aspect of the left upper lobe with associated postobstructive airspace consolidation in the left upper lobe. Right lung is clear. No pleural effusions. No pneumothorax. No evidence of pulmonary edema. Heart size is normal. The patient is rotated to the left on today's exam, resulting in distortion of the mediastinal contours and reduced  diagnostic sensitivity and specificity for mediastinal pathology. Atherosclerotic calcifications in the thoracic aorta. IMPRESSION: 1. Persistent central left upper lobe mass with postobstructive atelectasis/consolidation in the left upper lobe. Findings remain highly concerning for primary bronchogenic neoplasm. 2. Aortic atherosclerosis. Electronically Signed   By: Vinnie Langton M.D.    On: 01/17/2021 07:30   DG Chest 2 View  Result Date: 01/05/2021 CLINICAL DATA:  Malaise. EXAM: CHEST - 2 VIEW COMPARISON:  12/15/2019 FINDINGS: Hazy opacification of the left mid to upper chest with probable visualization of the left major fissure which is upward bowed. Pleural fluid is suggested on the lateral view. Milder hazy density over the right chest. Borderline heart size. Stable mediastinal contours which are distorted by scoliosis and rotation. IMPRESSION: Hazy opacification of the bilateral chest, greater on the left where there could be multi segment collapse. Consider chest CT. Electronically Signed   By: Monte Fantasia M.D.   On: 01/05/2021 04:35   CT HEAD W & WO CONTRAST  Result Date: 01/05/2021 CLINICAL DATA:  Cancer of unknown primary, staging. EXAM: CT HEAD WITHOUT AND WITH CONTRAST TECHNIQUE: Contiguous axial images were obtained from the base of the skull through the vertex without and with intravenous contrast CONTRAST:  31mL OMNIPAQUE IOHEXOL 350 MG/ML SOLN COMPARISON:  Head MRI 02/03/2019 FINDINGS: Brain: There is no evidence of an acute infarct, intracranial hemorrhage, mass, midline shift, or extra-axial fluid collection. Mild cerebral atrophy is within normal limits for age. Patchy hypodensities in the cerebral white matter and deep gray nuclei are nonspecific but compatible with moderate chronic small vessel ischemic disease. Vascular: Calcified atherosclerosis at the skull base. Major dural venous sinuses are grossly patent. Skull: No fracture or suspicious osseous lesion. Sinuses/Orbits: Remote right orbital floor fracture. Bilateral cataract extraction. Paranasal sinuses and mastoid air cells are clear. Other: None. IMPRESSION: 1. No evidence of intracranial metastases. 2. Moderate chronic small vessel ischemic disease. Electronically Signed   By: Logan Bores M.D.   On: 01/05/2021 10:07   CT Chest Wo Contrast  Result Date: 01/05/2021 CLINICAL DATA:  Pneumonia.  Abnormal  chest x-ray. EXAM: CT CHEST WITHOUT CONTRAST TECHNIQUE: Multidetector CT imaging of the chest was performed following the standard protocol without IV contrast. COMPARISON:  Chest x-ray earlier same day.  Chest CT 08/09/2018 FINDINGS: Cardiovascular: Heart size mildly increased. Coronary artery calcification is evident. Mild atherosclerotic calcification is noted in the wall of the thoracic aorta. Mediastinum/Nodes: 1.9 cm short axis prevascular node identified on 45/2. 1.1 cm short axis identified in the AP window and there is a 1.0 cm short axis lymph node in the subcarinal station. The esophagus has normal imaging features. Surgical clips noted right axilla. There is no axillary lymphadenopathy. Upper normal lymph nodes in the left thoracic inlet measure up to 9 mm short axis. Lungs/Pleura: Volume loss with complete opacification of the left upper lobe. Left upper lobe bronchus is obliterated. Dependent atelectasis noted left lower lobe with small left pleural effusion. No suspicious pulmonary nodule or mass in the right lung. Upper Abdomen: 5.7 cm incompletely visualized water density structure in the left upper quadrant probably represents enlargement of an exophytic left renal superior pole cyst visualized on CT stone study of 06/27/2015. Musculoskeletal: No worrisome lytic or sclerotic osseous abnormality. IMPRESSION: 1. Complete opacification of the left upper lobe with associated volume loss. Left upper lobe bronchus is obliterated. Imaging features suggest central obstructing mass lesion. CT chest with contrast may prove helpful to further delineate obstructive etiology. PET-CT could also be considered  to further assess. 2. Mediastinal lymphadenopathy concerning for metastatic disease. This could also be further assessed at PET-CT. 3. Small left pleural effusion with dependent atelectasis in the left lower lobe. 4. Aortic Atherosclerosis (ICD10-I70.0). Electronically Signed   By: Misty Stanley M.D.   On:  01/05/2021 07:08   CT CHEST W CONTRAST  Result Date: 01/05/2021 CLINICAL DATA:  Cancer of unknown primary.  Staging. EXAM: CT CHEST, ABDOMEN, AND PELVIS WITH CONTRAST TECHNIQUE: Multidetector CT imaging of the chest, abdomen and pelvis was performed following the standard protocol during bolus administration of intravenous contrast. CONTRAST:  23mL OMNIPAQUE IOHEXOL 350 MG/ML SOLN COMPARISON:  Chest CT without contrast earlier same day FINDINGS: CT CHEST FINDINGS Cardiovascular: The heart size is normal. No substantial pericardial effusion. Coronary artery calcification is evident. Mild atherosclerotic calcification is noted in the wall of the thoracic aorta. Mediastinum/Nodes: As before, mediastinal lymphadenopathy noted with 1.9 cm short axis prevascular node, 1.1 cm short axis node in the AP window, and 1.0 cm short axis lymph node in the subcarinal station. No right hilar lymphadenopathy. Although partially obscured by adjacent collapse lung, there appears to be a 5.2 x 2.9 x 4.4 cm soft tissue mass in the left suprahilar region (image 21/3), encasing and markedly attenuating pulmonary arteries to the left upper lobe and obliterating the left upper lobe bronchus. There is complete collapse of left upper lobe peripheral to this lesion. Lungs/Pleura: Collapse/consolidation of the left upper lobe is associated with some minimal dependent atelectasis in the left lower lobe and small left pleural effusion. No suspicious pulmonary nodule or mass in the right lung. Subpleural reticulation noted bilaterally suggesting underlying component of chronic interstitial lung disease. Musculoskeletal: No worrisome lytic or sclerotic osseous abnormality. Old left rib fractures noted surgical changes evident in the right breast. CT ABDOMEN PELVIS FINDINGS Hepatobiliary: No suspicious focal abnormality within the liver parenchyma. Gallbladder is surgically absent. No intrahepatic or extrahepatic biliary dilation. Pancreas: No  focal mass lesion. No dilatation of the main duct. No intraparenchymal cyst. No peripancreatic edema. Spleen: 2.7 cm hypoattenuating lesion identified anterior spleen, stable since prior chest CT of 08/09/2018 and also comparing back to a study from 07/16/2017 suggesting benign etiology. Adrenals/Urinary Tract: No adrenal nodule or mass. Central sinus cysts noted in both kidneys. No suspicious enhancing renal mass lesion. 5.5 cm exophytic cyst upper pole left kidney increased from 4.8 cm on abdomen CT 08/04/2016. 16 mm cyst posterior upper pole left kidney was 15 mm on the study from 4 years ago. No evidence for hydroureter. The urinary bladder appears normal for the degree of distention. Stomach/Bowel: Stomach is unremarkable. No gastric wall thickening. No evidence of outlet obstruction. Duodenum is normally positioned as is the ligament of Treitz. No small bowel wall thickening. No small bowel dilatation. The terminal ileum is normal. The appendix is not well visualized, but there is no edema or inflammation in the region of the cecum. No gross colonic mass. No colonic wall thickening. Diverticular changes are noted in the left colon without evidence of diverticulitis. Vascular/Lymphatic: There is moderate atherosclerotic calcification of the abdominal aorta without aneurysm. There is no gastrohepatic or hepatoduodenal ligament lymphadenopathy. No retroperitoneal or mesenteric lymphadenopathy. No pelvic sidewall lymphadenopathy. Reproductive: Uterus surgically absent.  There is no adnexal mass. Other: No intraperitoneal free fluid. Musculoskeletal: Small supraumbilical ventral hernia contains only fat. There is a tiny umbilical hernia through which a short segment of small bowel protrudes without complicating features. No worrisome lytic or sclerotic osseous abnormality. Thoracolumbar scoliosis evident.  IMPRESSION: 1. 5.2 x 2.9 x 4.4 cm soft tissue mass in the left suprahilar region encasing left upper lobe  pulmonary arteries and obliterating the left upper lobe bronchus with complete collapse of the left upper lobe peripheral to this lesion. Imaging features are compatible with neoplasm. 2. Mediastinal lymphadenopathy consistent with metastatic disease. 3. No evidence for metastatic disease in the abdomen or pelvis. 4. 2.7 cm low-density splenic lesions stable since 07/08/2017 consistent with benign etiology. 5. Left colonic diverticulosis without diverticulitis. 6. Small left pleural effusion. 7. Aortic Atherosclerosis (ICD10-I70.0). Electronically Signed   By: Misty Stanley M.D.   On: 01/05/2021 10:23   CT Angio Chest PE W/Cm &/Or Wo Cm  Result Date: 01/17/2021 CLINICAL DATA:  Shortness of breath EXAM: CT ANGIOGRAPHY CHEST WITH CONTRAST TECHNIQUE: Multidetector CT imaging of the chest was performed using the standard protocol during bolus administration of intravenous contrast. Multiplanar CT image reconstructions and MIPs were obtained to evaluate the vascular anatomy. CONTRAST:  73mL OMNIPAQUE IOHEXOL 350 MG/ML SOLN COMPARISON:  01/05/2021 FINDINGS: Cardiovascular: No filling defects in the pulmonary arteries to suggest pulmonary emboli. Heart is borderline in size. Scattered aortic and coronary artery calcifications. Mediastinum/Nodes: Mediastinal and left hilar adenopathy again noted, unchanged. Index AP window lymph node has a short axis diameter of 11 mm, stable. AP window/left hilar lymph node has a short axis diameter of 19 mm on image 32, stable. Lungs/Pleura: Soft tissue mass in the left suprahilar region again noted, encasing the left pulmonary artery and causing occlusion of the left upper lobe bronchus. Continued complete collapse of the left upper lobe. Small left pleural effusion. Findings are stable since recent study. No confluent opacity on the right. Upper Abdomen: Imaging into the upper abdomen demonstrates no acute findings. Musculoskeletal: Chest wall soft tissues are unremarkable. No acute  bony abnormality. Degenerative changes throughout the thoracic spine. Review of the MIP images confirms the above findings. IMPRESSION: Stable left suprahilar mass encasing the left pulmonary artery and left upper lobe bronchus with obliteration of the left upper lobe bronchus and complete collapse of the left upper lobe. Findings compatible with neoplasm. Stable so stated mediastinal and left hilar adenopathy. Small left pleural effusion. No evidence of pulmonary embolus. Aortic Atherosclerosis (ICD10-I70.0). Electronically Signed   By: Rolm Baptise M.D.   On: 01/17/2021 10:05   CT ABDOMEN PELVIS W CONTRAST  Result Date: 01/05/2021 CLINICAL DATA:  Cancer of unknown primary.  Staging. EXAM: CT CHEST, ABDOMEN, AND PELVIS WITH CONTRAST TECHNIQUE: Multidetector CT imaging of the chest, abdomen and pelvis was performed following the standard protocol during bolus administration of intravenous contrast. CONTRAST:  42mL OMNIPAQUE IOHEXOL 350 MG/ML SOLN COMPARISON:  Chest CT without contrast earlier same day FINDINGS: CT CHEST FINDINGS Cardiovascular: The heart size is normal. No substantial pericardial effusion. Coronary artery calcification is evident. Mild atherosclerotic calcification is noted in the wall of the thoracic aorta. Mediastinum/Nodes: As before, mediastinal lymphadenopathy noted with 1.9 cm short axis prevascular node, 1.1 cm short axis node in the AP window, and 1.0 cm short axis lymph node in the subcarinal station. No right hilar lymphadenopathy. Although partially obscured by adjacent collapse lung, there appears to be a 5.2 x 2.9 x 4.4 cm soft tissue mass in the left suprahilar region (image 21/3), encasing and markedly attenuating pulmonary arteries to the left upper lobe and obliterating the left upper lobe bronchus. There is complete collapse of left upper lobe peripheral to this lesion. Lungs/Pleura: Collapse/consolidation of the left upper lobe is associated with  some minimal dependent  atelectasis in the left lower lobe and small left pleural effusion. No suspicious pulmonary nodule or mass in the right lung. Subpleural reticulation noted bilaterally suggesting underlying component of chronic interstitial lung disease. Musculoskeletal: No worrisome lytic or sclerotic osseous abnormality. Old left rib fractures noted surgical changes evident in the right breast. CT ABDOMEN PELVIS FINDINGS Hepatobiliary: No suspicious focal abnormality within the liver parenchyma. Gallbladder is surgically absent. No intrahepatic or extrahepatic biliary dilation. Pancreas: No focal mass lesion. No dilatation of the main duct. No intraparenchymal cyst. No peripancreatic edema. Spleen: 2.7 cm hypoattenuating lesion identified anterior spleen, stable since prior chest CT of 08/09/2018 and also comparing back to a study from 07/16/2017 suggesting benign etiology. Adrenals/Urinary Tract: No adrenal nodule or mass. Central sinus cysts noted in both kidneys. No suspicious enhancing renal mass lesion. 5.5 cm exophytic cyst upper pole left kidney increased from 4.8 cm on abdomen CT 08/04/2016. 16 mm cyst posterior upper pole left kidney was 15 mm on the study from 4 years ago. No evidence for hydroureter. The urinary bladder appears normal for the degree of distention. Stomach/Bowel: Stomach is unremarkable. No gastric wall thickening. No evidence of outlet obstruction. Duodenum is normally positioned as is the ligament of Treitz. No small bowel wall thickening. No small bowel dilatation. The terminal ileum is normal. The appendix is not well visualized, but there is no edema or inflammation in the region of the cecum. No gross colonic mass. No colonic wall thickening. Diverticular changes are noted in the left colon without evidence of diverticulitis. Vascular/Lymphatic: There is moderate atherosclerotic calcification of the abdominal aorta without aneurysm. There is no gastrohepatic or hepatoduodenal ligament  lymphadenopathy. No retroperitoneal or mesenteric lymphadenopathy. No pelvic sidewall lymphadenopathy. Reproductive: Uterus surgically absent.  There is no adnexal mass. Other: No intraperitoneal free fluid. Musculoskeletal: Small supraumbilical ventral hernia contains only fat. There is a tiny umbilical hernia through which a short segment of small bowel protrudes without complicating features. No worrisome lytic or sclerotic osseous abnormality. Thoracolumbar scoliosis evident. IMPRESSION: 1. 5.2 x 2.9 x 4.4 cm soft tissue mass in the left suprahilar region encasing left upper lobe pulmonary arteries and obliterating the left upper lobe bronchus with complete collapse of the left upper lobe peripheral to this lesion. Imaging features are compatible with neoplasm. 2. Mediastinal lymphadenopathy consistent with metastatic disease. 3. No evidence for metastatic disease in the abdomen or pelvis. 4. 2.7 cm low-density splenic lesions stable since 07/08/2017 consistent with benign etiology. 5. Left colonic diverticulosis without diverticulitis. 6. Small left pleural effusion. 7. Aortic Atherosclerosis (ICD10-I70.0). Electronically Signed   By: Misty Stanley M.D.   On: 01/05/2021 10:23    Assessment/Plan SOB (shortness of breath) Duo Nebs BID for 1 week and then prn  Lung mass Referal to Oncology Pending Patient states that she would like to know her treatment options. She is very open to chemo and radiation therapy She wants to make sure her quality of life is maintained. SOB continues ot be her main Issue  Conjunctival hemorrhage, left eye with Buise below Will Check Xray of Left Orbit  Other issues  Essential hypertension On Aldactone   Hallucination, visual On low-dose of Seroquel Previous work-up has been negative Prediabetes A1c stable Hypothyroidism (acquired) TSH slightly elevated will follow in 3 months Depression with anxiety On Zoloft OSA (obstructive sleep apnea) Continue  CPAP Cognitive impairment Continue Namenda Her memory and MMSE in the past has been 30 out of 30. MRI showed generalized atrophy  Vitamin B12 deficiency Good levels Bilateral leg edema Doing well on Metolazone and Aldactone Did not tolerate Lasix and Toresimide  Labs/tests ordered:  * No order type specified * Next appt:  04/20/2021

## 2021-01-20 ENCOUNTER — Telehealth: Payer: Self-pay | Admitting: Internal Medicine

## 2021-01-20 NOTE — Telephone Encounter (Signed)
I cld and lft Tammy, at [facility], a vm to inform her Ms. Viney needs to see a pulmonologist for a biopsy first per Dr. Julien Nordmann.

## 2021-01-27 ENCOUNTER — Telehealth: Payer: Self-pay | Admitting: Radiation Oncology

## 2021-01-27 ENCOUNTER — Ambulatory Visit (INDEPENDENT_AMBULATORY_CARE_PROVIDER_SITE_OTHER): Payer: Medicare Other | Admitting: Emergency Medicine

## 2021-01-27 ENCOUNTER — Other Ambulatory Visit: Payer: Self-pay

## 2021-01-27 ENCOUNTER — Encounter: Payer: Self-pay | Admitting: Emergency Medicine

## 2021-01-27 VITALS — BP 116/68 | HR 101 | Temp 97.6°F | Ht 67.0 in | Wt 263.0 lb

## 2021-01-27 DIAGNOSIS — R911 Solitary pulmonary nodule: Secondary | ICD-10-CM | POA: Diagnosis not present

## 2021-01-27 DIAGNOSIS — R918 Other nonspecific abnormal finding of lung field: Secondary | ICD-10-CM

## 2021-01-27 NOTE — Progress Notes (Signed)
Subjective:    Patient ID: Tamara Morales, female    DOB: 1930/07/29, 87 y.o.   MRN: [id number]  HPI 87 year old woman with a minimal tobacco history, previously seen in our office for severe OSA/OHS with associated chronic hypoxemic respiratory failure.  She also has a history of obesity, obesity, breast cancer (lumpectomy/XRT 2000), hypertension, hypothyroidism.  She was admitted in late July with urinary frequency and diagnosed with urinary tract infection.  Her evaluation revealed abnormal chest x-ray with left upper lobe opacity.  This led to a CT chest as below that showed a left upper lobe mass with left upper lobe bronchial obstruction.  Possible bronchoscopy was discussed, but it was decided that we would defer for now.  She was back in the emergency department 8/1 with some chest tightness and dyspnea.  A repeat CT-PA was performed as below.  She improved after addition of her O2. Currently on 3L/min. Not coughing. Has seen no hemoptysis.   CT chest 01/05/2021 reviewed by me showed mediastinal lymphadenopathy including a 1 cm subcarinal node, left upper lobe collapse with a 5.2 x 2.9 x 4.4 soft tissue mass in the left suprahilar region encasing the pulmonary artery and obliterating left upper lobe bronchus  CT-PA 01/17/21 reviewed by me shows stable lymphadenopathy, no significant change in the soft tissue mass with left suprahilar extension encasing the left PA and including the left upper lobe bronchus the left upper lobe is almost completely collapsed.  There is small left pleural effusion   Review of Systems As per HPI  Past Medical History:  Diagnosis Date   Altered mental status    ARTHRITIS, KNEES, BILATERAL    Breast CA ([place]) 2000   s/p R lumpectomy and XRT   CHF (congestive heart failure) (HCC)    DEPRESSION    DJD (degenerative joint disease) of knee    GERD    HYPERLIPIDEMIA    HYPERTENSION    HYPOTHYROIDISM    postsurgical   Hypoxia    MIGRAINE HEADACHE     Morbid obesity (HCC)    OSA (obstructive sleep apnea) 01/17/2011 dx   Oxygen dependent    Urge incontinence    UTI (lower urinary tract infection) 12/2014     Family History  Problem Relation Age of Onset   Heart disease Mother    Heart disease Father    Emphysema Sister    Coronary artery disease Neg Hx      Social History   Socioeconomic History   Marital status: Married    Spouse name: Not on file   Number of children: 5   Years of education: some college   Highest education level: Not on file  Occupational History   Occupation: RETIRED    Employer: RETIRED    Comment: worked on office  Tobacco Use   Smoking status: Former    Packs/day: 0.25    Years: 2.00    Pack years: 0.50    Types: Cigarettes    Start date: 1954    Quit date: 06/19/1974    Years since quitting: 46.6   Smokeless tobacco: Never   Tobacco comments:    Quit in late 20's  Vaping Use   Vaping Use: Never used  Substance and Sexual Activity   Alcohol use: No    Alcohol/week: 0.0 standard drinks   Drug use: No   Sexual activity: Never  Other Topics Concern   Not on file  Social History Narrative   Married to husband that has  dementia (he lives in the skilled nursing wing) and this is a big stressor. 5 children. 10 grandchildren. 2 greatgrandchildren. All children Shelby      Lives at [facility], in independent living.      Retired from multiple different Community education officer, university, Psychologist, educational.       Hobbies: Management consultant, write poetry, craft dolls      Right-handed.      No daily caffeine use.   Social Determinants of Health   Financial Resource Strain: Not on file  Food Insecurity: Not on file  Transportation Needs: Not on file  Physical Activity: Not on file  Stress: Not on file  Social Connections: Not on file  Intimate Partner Violence: Not on file     Allergies  Allergen Reactions   Atorvastatin Nausea Only   Azithromycin Itching   Beta Adrenergic Blockers Other (See  Comments)    Depression    Ciprofloxacin Other (See Comments)    Edgy and very jumpy     Codeine Nausea And Vomiting   Darvon [Propoxyphene] Nausea And Vomiting   Epinephrine Other (See Comments)    Rapid pulse, sweats   Klonopin [Clonazepam] Other (See Comments)    Makes her feel very suicidal    Oxycodone Other (See Comments)    Patient felt like it altered her mental status "Crazy" . Hallucinations later as well.   Paxil [Paroxetine Hydrochloride] Other (See Comments)    Severe depression    Prednisone Other (See Comments)    "makes me feel really bad"   Propoxyphene Hcl Nausea And Vomiting   Tape Itching   Torsemide Other (See Comments)    Patient stated that she doesn't recall the reaction   Tramadol Other (See Comments)    Feels "jittery"   Vicodin [Hydrocodone-Acetaminophen] Other (See Comments)    Hallucinations   Meloxicam Diarrhea   Other Rash    Polyester sheets "cause me a rash"   Vancomycin Rash    Localized rash related to infusion rate     Outpatient Medications Prior to Visit  Medication Sig Dispense Refill   alum & mag hydroxide-simeth (MAALOX/MYLANTA) 200-200-20 MG/5ML suspension Take 30 mLs by mouth every 4 (four) hours as needed for indigestion or heartburn (indigestion).     aspirin 81 MG tablet Take 81 mg by mouth daily.     CITRACAL PETITES/VITAMIN D 200-250 MG-UNIT TABS Take 1 tablet by mouth every morning.     cyanocobalamin 2000 MCG tablet Take 2,000 mcg by mouth daily.     FIBER PO Take 1 tablet by mouth 2 (two) times daily.     Melatonin 5 MG TABS Take 1 tablet (5 mg total) by mouth at bedtime. 30 tablet 1   memantine (NAMENDA) 10 MG tablet TAKE 1 TABLET BY MOUTH TWICE DAILY. 60 tablet 5   metolazone (ZAROXOLYN) 2.5 MG tablet TAKE 1 TABLET FIVE DAYS A WEEK, TAKE 2 TABLETS ON MONDAY AND FRIDAY 36 tablet 5   omeprazole (PRILOSEC) 20 MG capsule TAKE 1 CAPSULE TWICE DAILY BEFORE MEALS 60 capsule 5   OXYGEN Inhale 2 L into the lungs continuous. KEEP  O2 SATS >= 90%. Every Shift     potassium chloride (MICRO-K) 10 MEQ CR capsule TAKE 4 CAPSULES DAILY. 120 capsule 5   QUEtiapine (SEROQUEL) 25 MG tablet TAKE 1/2 TABLET AT BEDTIME. 15 tablet 5   saccharomyces boulardii (FLORASTOR) 250 MG capsule Take 1 capsule (250 mg total) by mouth daily. 30 capsule 1   sertraline (ZOLOFT) 100  MG tablet TAKE ONE TABLET BY MOUTH AT BEDTIME 30 tablet 5   spironolactone (ALDACTONE) 25 MG tablet TAKE 1 TABLET ONCE DAILY. 30 tablet 5   SYNTHROID 137 MCG tablet TAKE 1 TABLET ONCE DAILY BEFORE BREAKFAST. 30 tablet 0   No facility-administered medications prior to visit.         Objective:   Physical Exam Vitals:   01/27/21 1042  BP: 116/68  Pulse: (!) 101  Temp: 97.6 F (36.4 C)  TempSrc: Oral  SpO2: 95%  Weight: 263 lb (119.3 kg)  Height: $Remove'5\' 7"'sNkdSru$  (1.702 m)   Gen: Pleasant, obese elderly woman in wheelchair, in no distress,  normal affect  ENT: No lesions,  mouth clear,  oropharynx clear, no postnasal drip  Neck: No JVD, no stridor  Lungs: No use of accessory muscles, no crackles or wheezing on normal respiration, decreased at both bases, no wheeze on forced expiration  Cardiovascular: RRR, heart sounds normal, no murmur or gallops, 1+ peripheral edema  Musculoskeletal: No deformities, no cyanosis or clubbing  Neuro: alert, awake, non focal  Skin: Warm, no lesions or rash     Assessment & Plan:  Mass of upper lobe of left lung Discussed the CT findings with the patient and her daughters.  Extended meeting regarding her goals for care and the current options.  I did explain that this is a lung cancer and that it likely will at some point take her life.  That said I think that there is a role for palliation that will help improve her quality of life.  She is interested in this but does not want invasive procedures.  She would like to avoid bronchoscopy if at all possible.  I told her that there may be some benefit to be had from targeted  radiation therapy to the area of the lesion impacting the left upper lobe bronchus.  This could potentially prevent spread to neighboring airways, more shortness of breath, hemoptysis, etc.  She is willing to consider this but will want to review the pros and cons with Radiation Oncology.  I did explain to her that I do not know for sure if they would be willing to offer radiation without tissue diagnosis, so reconsideration of bronchoscopy may be necessary.  Time spent 60 minutes  Baltazar Apo, MD, PhD 01/27/2021, 2:07 PM [facility] [phone] or if no answer before 7:00PM call [phone] For any issues after 7:00PM please call eLink [phone]

## 2021-01-27 NOTE — Assessment & Plan Note (Signed)
Discussed the CT findings with the patient and her daughters.  Extended meeting regarding her goals for care and the current options.  I did explain that this is a lung cancer and that it likely will at some point take her life.  That said I think that there is a role for palliation that will help improve her quality of life.  She is interested in this but does not want invasive procedures.  She would like to avoid bronchoscopy if at all possible.  I told her that there may be some benefit to be had from targeted radiation therapy to the area of the lesion impacting the left upper lobe bronchus.  This could potentially prevent spread to neighboring airways, more shortness of breath, hemoptysis, etc.  She is willing to consider this but will want to review the pros and cons with Radiation Oncology.  I did explain to her that I do not know for sure if they would be willing to offer radiation without tissue diagnosis, so reconsideration of bronchoscopy may be necessary.

## 2021-01-27 NOTE — Telephone Encounter (Signed)
Called patient to schedule consultation with Dr. Isidore Moos. No answer, LVM for return call.

## 2021-01-27 NOTE — Patient Instructions (Signed)
We reviewed your CT scan of the chest today. We discussed the pros and cons, details of bronchoscopy. For now we are going to refer you to see the Radiation Oncologists to discuss possible options for palliative radiation therapy to left upper lobe airway Please continue to use your oxygen 3 L/min Follow with Dr Lamonte Sakai if needed for any changes in your breathing or for any discussion regarding your lung mass or any testing.

## 2021-01-28 ENCOUNTER — Telehealth: Payer: Self-pay | Admitting: Radiation Oncology

## 2021-01-28 NOTE — Telephone Encounter (Signed)
Correction: Called patient to schedule consultation with Dr. Lisbeth Renshaw. No answer, LVM for return call.  Also, reached out to [employer] (patient's daughter) to schedule appointment. No answer, LVM for return cal.

## 2021-02-02 NOTE — Progress Notes (Signed)
Thoracic Location of Tumor / Histology: LUL Lung  Patient presented to the ER with complaints of increased urinary frequency and nausea for about a week to 10 days.  She was treated for UTI and did not feel any better.  Returned to the ER to be evaluated.  CT CAP 01/05/2021: 5.2 x 2.9 x 4.4 cm soft tissue mass in the left suprahilar region encasing left upper lobe pulmonary arteries and obliterating the left upper lobe bronchus with complete collapse of the left upper lobe peripheral to this lesion. Imaging features are compatible with neoplasm. Mediastinal lymphadenopathy consistent with metastatic disease. No evidence for metastatic disease in the abdomen or pelvis.  CT Head 01/05/2021: No evidence of intracranial metastases.  CT Chest 01/05/2021: Complete opacification of the left upper lobe with associated volume loss. Left upper lobe bronchus is obliterated. Imaging features suggest central obstructing mass lesion. CT chest with contrast may prove helpful to further delineate obstructive etiology. PET-CT could also be considered to further assess.  Mediastinal lymphadenopathy concerning for metastatic disease.  This could also be further assessed at PET-CT.  Chest Xray 01/05/2021: Hazy opacification of the bilateral chest, greater on the left where there could be multi-segment collapse.   Biopsies of   Tobacco/Marijuana/Snuff/ETOH use: Former Smoker  Past/Anticipated interventions by Pulmonary, if any:  Dr. Lamonte Sakai 01/27/2021 -I think that there is a role for palliation that will help improve her quality of life.  She is interested in this but does not want invasive procedures.  She would like to avoid bronchoscopy if at all possible. - I told her that there may be some benefit to be had from targeted radiation therapy to the area of the lesion impacting the left upper lobe bronchus.    Past/Anticipated interventions by cardiothoracic surgery, if any:   Past/Anticipated interventions by  medical oncology, if any:    Signs/Symptoms Weight changes, if any: Stable Respiratory complaints, if any: SOB, 3 liters Oxygen continuous since about June this year. Hemoptysis, if any: Has dry cough.  No blood noted. Pain issues, if any: Has noted some chest pain/tightness in the past.    SAFETY ISSUES: Prior radiation? Yes, 2000 Right Breast Lumpectomy with radiation thinks about 30 days.  Done in [place]. Pacemaker/ICD?  No Possible current pregnancy? Hysterectomy Is the patient on methotrexate? No  Current Complaints / other details:

## 2021-02-03 ENCOUNTER — Ambulatory Visit
Admission: RE | Admit: 2021-02-03 | Discharge: 2021-02-03 | Disposition: A | Discharge status: Home / Self Care | Payer: Medicare Other | Source: Ambulatory Visit | Attending: Radiation Oncology | Admitting: Radiation Oncology

## 2021-02-03 ENCOUNTER — Encounter: Payer: Self-pay | Admitting: Radiation Oncology

## 2021-02-03 ENCOUNTER — Telehealth: Payer: Medicare Other | Admitting: Radiation Oncology

## 2021-02-03 ENCOUNTER — Other Ambulatory Visit: Payer: Self-pay

## 2021-02-03 VITALS — Ht 67.0 in | Wt 260.0 lb

## 2021-02-03 DIAGNOSIS — C3412 Malignant neoplasm of upper lobe, left bronchus or lung: Secondary | ICD-10-CM | POA: Diagnosis not present

## 2021-02-03 DIAGNOSIS — R918 Other nonspecific abnormal finding of lung field: Secondary | ICD-10-CM

## 2021-02-04 ENCOUNTER — Ambulatory Visit
Admission: RE | Admit: 2021-02-04 | Discharge: 2021-02-04 | Disposition: A | Discharge status: Home / Self Care | Payer: Medicare Other | Source: Ambulatory Visit | Attending: Radiation Oncology | Admitting: Radiation Oncology

## 2021-02-04 DIAGNOSIS — Z510 Encounter for antineoplastic radiation therapy: Secondary | ICD-10-CM | POA: Diagnosis not present

## 2021-02-04 DIAGNOSIS — C3412 Malignant neoplasm of upper lobe, left bronchus or lung: Secondary | ICD-10-CM | POA: Diagnosis not present

## 2021-02-06 NOTE — Progress Notes (Signed)
Radiation Oncology         [phone] ________________________________  Name: Tamara Morales        MRN: [id number]  Date of Service: 02/03/2021 DOB: [birth year]  EM:[id number], Rene Kocher, MD  Collene Gobble, MD     REFERRING PHYSICIAN: Collene Gobble, MD   DIAGNOSIS: The encounter diagnosis was Mass of upper lobe of left lung.   HISTORY OF PRESENT ILLNESS: Tamara Morales is a 87 y.o. female seen at the request of Dr. Lamonte Sakai for a probable locally advanced lung cancer.  The patient was recently seen in the emergency department on 01/01/2021 and had presented with thoughts of suicidal ideation and was found to have a urinary tract infection.  This was treated.  She was deemed cleared to return to her facility and resides at friend's home.  She returned to the emergency department on 01/05/2021 with complaints of malaise and fatigue a chest x-ray showed hazy opacification of the left mid to upper chest and a CT scan without contrast revealed a 1.9 cm prevascular lymph node a 1.1 cm AP window lymph node and 1 cm subcarinal lymph node there was volume loss with complete opacifications of the left upper lobe and left upper lobe bronchus appeared opacified and obliterated.  There was atelectasis of the left lower lobe and a small left pleural effusion there was concern for a fluid density along the left upper quadrant likely of superior renal pole cyst on the left.  CT head showed no evidence of intracranial disease.  Additional CT with contrast measured a mass in the left upper lobe measuring up to 5.2 cm and again demonstrated mediastinal adenopathy CT abdomen pelvis revealed low-density splenic lesions that were stable and felt to be benign.  She returned for evaluation on 01/17/2021 and had a persistence in the known left upper lobe mass, and CTPA that day was negative for filling defects.  She met with Dr. Lamonte Sakai and discussed options of pursuing a biopsy but is strongly against aggressive  interventions.  She is well aware of the working diagnosis is that she has a locally advanced lung cancer, likely a non-small cell histology.  She is seen today to discuss the possibility of palliative radiotherapy.  Of note she has had COPD and has been on oxygen for several years.  She has recently had to increase up to 4 L.   PREVIOUS RADIATION THERAPY: No   PAST MEDICAL HISTORY:  Past Medical History:  Diagnosis Date   Altered mental status    ARTHRITIS, KNEES, BILATERAL    Breast CA ([place]) 2000   s/p R lumpectomy and XRT   CHF (congestive heart failure) (HCC)    DEPRESSION    DJD (degenerative joint disease) of knee    GERD    HYPERLIPIDEMIA    HYPERTENSION    HYPOTHYROIDISM    postsurgical   Hypoxia    MIGRAINE HEADACHE    Morbid obesity (HCC)    OSA (obstructive sleep apnea) 01/17/2011 dx   Oxygen dependent    Urge incontinence    UTI (lower urinary tract infection) 12/2014       PAST SURGICAL HISTORY: Past Surgical History:  Procedure Laterality Date   ABDOMINAL HYSTERECTOMY     ADENOIDECTOMY     BREAST BIOPSY  2000   BREAST LUMPECTOMY  2000   CATARACT EXTRACTION     CHOLECYSTECTOMY  1995   CYSTOSCOPY/URETEROSCOPY/HOLMIUM LASER Right 06/28/2015   Procedure: CYSTOSCOPY RIGHT RETROGRAD RIGHT URETEROSCOPY/HOLMIUM LASER WITH  RIGHT STENT PLACEMENT;  Surgeon: Alexis Frock, MD;  Location: [facility];  Service: Urology;  Laterality: Right;   PARATHYROIDECTOMY     2-3 removed   REPLACEMENT TOTAL KNEE BILATERAL  1999, 2005   Right leg femur repaired     THYROIDECTOMY     TONSILLECTOMY     TUBAL LIGATION       FAMILY HISTORY:  Family History  Problem Relation Age of Onset   Heart disease Mother    Heart disease Father    Emphysema Sister    Coronary artery disease Neg Hx      SOCIAL HISTORY:  reports that she quit smoking about 46 years ago. Her smoking use included cigarettes. She started smoking about 68 years ago. She has a 0.50 pack-year smoking history. She has  never used smokeless tobacco. She reports that she does not drink alcohol and does not use drugs.   ALLERGIES: Atorvastatin, Azithromycin, Beta adrenergic blockers, Ciprofloxacin, Codeine, Darvon [propoxyphene], Epinephrine, Klonopin [clonazepam], Oxycodone, Paxil [paroxetine hydrochloride], Prednisone, Propoxyphene hcl, Tape, Torsemide, Tramadol, Vicodin [hydrocodone-acetaminophen], Meloxicam, Other, and Vancomycin   MEDICATIONS:  Current Outpatient Medications  Medication Sig Dispense Refill   alum & mag hydroxide-simeth (MAALOX/MYLANTA) 200-200-20 MG/5ML suspension Take 30 mLs by mouth every 4 (four) hours as needed for indigestion or heartburn (indigestion).     aspirin 81 MG tablet Take 81 mg by mouth daily.     CITRACAL PETITES/VITAMIN D 200-250 MG-UNIT TABS Take 1 tablet by mouth every morning.     cyanocobalamin 2000 MCG tablet Take 2,000 mcg by mouth daily.     FIBER PO Take 1 tablet by mouth 2 (two) times daily.     Melatonin 5 MG TABS Take 1 tablet (5 mg total) by mouth at bedtime. 30 tablet 1   memantine (NAMENDA) 10 MG tablet TAKE 1 TABLET BY MOUTH TWICE DAILY. 60 tablet 5   metolazone (ZAROXOLYN) 2.5 MG tablet TAKE 1 TABLET FIVE DAYS A WEEK, TAKE 2 TABLETS ON MONDAY AND FRIDAY 36 tablet 5   omeprazole (PRILOSEC) 20 MG capsule TAKE 1 CAPSULE TWICE DAILY BEFORE MEALS 60 capsule 5   OXYGEN Inhale 2 L into the lungs continuous. KEEP O2 SATS >= 90%. Every Shift     potassium chloride (MICRO-K) 10 MEQ CR capsule TAKE 4 CAPSULES DAILY. 120 capsule 5   QUEtiapine (SEROQUEL) 25 MG tablet TAKE 1/2 TABLET AT BEDTIME. 15 tablet 5   saccharomyces boulardii (FLORASTOR) 250 MG capsule Take 1 capsule (250 mg total) by mouth daily. 30 capsule 1   sertraline (ZOLOFT) 100 MG tablet TAKE ONE TABLET BY MOUTH AT BEDTIME 30 tablet 5   spironolactone (ALDACTONE) 25 MG tablet TAKE 1 TABLET ONCE DAILY. 30 tablet 5   SYNTHROID 137 MCG tablet TAKE 1 TABLET ONCE DAILY BEFORE BREAKFAST. 30 tablet 0   No  current facility-administered medications for this encounter.     REVIEW OF SYSTEMS: On review of systems, the patient reports that she is short of breath when she is trying to exert herself, she states that today she is at 3 L of oxygen and feels that her weight has been stable overall.  She has had a dry cough but no hemoptysis.  No other complaints are verbalized.    PHYSICAL EXAM:  Wt Readings from Last 3 Encounters:  02/03/21 260 lb (117.9 kg)  01/27/21 263 lb (119.3 kg)  01/18/21 256 lb 9.6 oz (116.4 kg)     Pain Assessment Pain Score: 0-No pain/10  In general this  is an elderly appearing Caucasian female in no acute distress. She's alert and oriented x4 and appropriate throughout the examination. Cardiopulmonary assessment is negative for acute distress and she exhibits normal effort.     ECOG = 2  0 - Asymptomatic (Fully active, able to carry on all predisease activities without restriction)  1 - Symptomatic but completely ambulatory (Restricted in physically strenuous activity but ambulatory and able to carry out work of a light or sedentary nature. For example, light housework, office work)  2 - Symptomatic, <50% in bed during the day (Ambulatory and capable of all self care but unable to carry out any work activities. Up and about more than 50% of waking hours)  3 - Symptomatic, >50% in bed, but not bedbound (Capable of only limited self-care, confined to bed or chair 50% or more of waking hours)  4 - Bedbound (Completely disabled. Cannot carry on any self-care. Totally confined to bed or chair)  5 - Death   Eustace Pen MM, Creech RH, Tormey DC, et al. [phone]). "Toxicity and response criteria of the [facility]". [place] Oncol. 5 (6): 649-55    LABORATORY DATA:  Lab Results  Component Value Date   WBC 8.4 01/17/2021   HGB 15.0 01/17/2021   HCT 46.8 (H) 01/17/2021   MCV 89.1 01/17/2021   PLT PLATELET CLUMPS NOTED ON SMEAR, UNABLE TO ESTIMATE  01/17/2021   Lab Results  Component Value Date   NA 136 01/17/2021   K 3.7 01/17/2021   CL 95 (L) 01/17/2021   CO2 33 (H) 01/17/2021   Lab Results  Component Value Date   ALT 11 01/06/2021   AST 12 (L) 01/06/2021   ALKPHOS 69 01/06/2021   BILITOT 0.7 01/06/2021      RADIOGRAPHY: DG Chest 2 View  Result Date: 01/17/2021 CLINICAL DATA:  87 year old female with chest pressure and shortness of breath. EXAM: CHEST - 2 VIEW COMPARISON:  Chest x-ray 01/05/2021. FINDINGS: Persistent masslike density in the central aspect of the left upper lobe with associated postobstructive airspace consolidation in the left upper lobe. Right lung is clear. No pleural effusions. No pneumothorax. No evidence of pulmonary edema. Heart size is normal. The patient is rotated to the left on today's exam, resulting in distortion of the mediastinal contours and reduced diagnostic sensitivity and specificity for mediastinal pathology. Atherosclerotic calcifications in the thoracic aorta. IMPRESSION: 1. Persistent central left upper lobe mass with postobstructive atelectasis/consolidation in the left upper lobe. Findings remain highly concerning for primary bronchogenic neoplasm. 2. Aortic atherosclerosis. Electronically Signed   By: Vinnie Langton M.D.   On: 01/17/2021 07:30   CT Angio Chest PE W/Cm &/Or Wo Cm  Result Date: 01/17/2021 CLINICAL DATA:  Shortness of breath EXAM: CT ANGIOGRAPHY CHEST WITH CONTRAST TECHNIQUE: Multidetector CT imaging of the chest was performed using the standard protocol during bolus administration of intravenous contrast. Multiplanar CT image reconstructions and MIPs were obtained to evaluate the vascular anatomy. CONTRAST:  40m OMNIPAQUE IOHEXOL 350 MG/ML SOLN COMPARISON:  01/05/2021 FINDINGS: Cardiovascular: No filling defects in the pulmonary arteries to suggest pulmonary emboli. Heart is borderline in size. Scattered aortic and coronary artery calcifications. Mediastinum/Nodes: Mediastinal  and left hilar adenopathy again noted, unchanged. Index AP window lymph node has a short axis diameter of 11 mm, stable. AP window/left hilar lymph node has a short axis diameter of 19 mm on image 32, stable. Lungs/Pleura: Soft tissue mass in the left suprahilar region again noted, encasing the left pulmonary artery  and causing occlusion of the left upper lobe bronchus. Continued complete collapse of the left upper lobe. Small left pleural effusion. Findings are stable since recent study. No confluent opacity on the right. Upper Abdomen: Imaging into the upper abdomen demonstrates no acute findings. Musculoskeletal: Chest wall soft tissues are unremarkable. No acute bony abnormality. Degenerative changes throughout the thoracic spine. Review of the MIP images confirms the above findings. IMPRESSION: Stable left suprahilar mass encasing the left pulmonary artery and left upper lobe bronchus with obliteration of the left upper lobe bronchus and complete collapse of the left upper lobe. Findings compatible with neoplasm. Stable so stated mediastinal and left hilar adenopathy. Small left pleural effusion. No evidence of pulmonary embolus. Aortic Atherosclerosis (ICD10-I70.0). Electronically Signed   By: Rolm Baptise M.D.   On: 01/17/2021 10:05       IMPRESSION/PLAN: 1. Putative Stage III, NSCLC of the LUL.  Dr. Lisbeth Renshaw discusses the imaging findings and data points radiographically with the patient and her 2 daughters who are also able to join [place] on MyChart.  The patient is very clear that she is looking for options to potentially aid in quality of life but does not wish to undergo aggressive interventions to diagnose what is most likely a locally advanced cancer.  She would not likely be a candidate for systemic therapy and states that she would not be interested in this anyway.  She prefers to forego additional medical oncology evaluation because of this desire.  She is however interested in options of palliative  radiotherapy and Dr. Lisbeth Renshaw discusses the limitations of not having tissue confirmation but given her clinical picture, age and comorbidities he agrees that it would be helpful in maintaining her quality of life and hence offers palliative radiotherapy to the chest.  We discussed the risks, benefits, short and long-term effects of radiotherapy as well as delivery and logistics and the patient is interested in proceeding.  We reviewed consent and she will formally sign when she comes in tomorrow for simulation.  We anticipate proceeding with treatment within the next week.  This encounter was provided by telemedicine platform MyChart.  The patient has provided two factor identification and has given verbal consent for this type of encounter and has been advised to only accept a meeting of this type in a secure network environment. The time spent during this encounter was 60 minutes including preparation, discussion, and coordination of the patient's care. The attendants for this meeting include Blenda Nicely, RN, Dr. Lisbeth Renshaw, Hayden Pedro  and [name], Gravity and Richrd Prime. During the encounter,  Blenda Nicely, RN, Dr. Lisbeth Renshaw, and Hayden Pedro were located at [facility] Radiation Oncology Department.  Tamara Morales was located at her apartment at [facility]. Her daughters Amy Park Liter &  Richrd Prime were also able to join [place] on MyChart remotely.   The above documentation reflects my direct findings during this shared patient visit. Please see the separate note by Dr. Lisbeth Renshaw on this date for the remainder of the patient's plan of care.    Carola Rhine, [facility]   **Disclaimer: This note was dictated with voice recognition software. Similar sounding words can inadvertently be transcribed and this note may contain transcription errors which may not have been corrected upon publication of note.**

## 2021-02-08 DIAGNOSIS — C3412 Malignant neoplasm of upper lobe, left bronchus or lung: Secondary | ICD-10-CM | POA: Diagnosis not present

## 2021-02-08 DIAGNOSIS — Z510 Encounter for antineoplastic radiation therapy: Secondary | ICD-10-CM | POA: Diagnosis not present

## 2021-02-09 ENCOUNTER — Ambulatory Visit
Admission: RE | Admit: 2021-02-09 | Discharge: 2021-02-09 | Disposition: A | Discharge status: Home / Self Care | Payer: Medicare Other | Source: Ambulatory Visit | Attending: Radiation Oncology | Admitting: Radiation Oncology

## 2021-02-09 DIAGNOSIS — C3412 Malignant neoplasm of upper lobe, left bronchus or lung: Secondary | ICD-10-CM | POA: Diagnosis not present

## 2021-02-09 DIAGNOSIS — Z510 Encounter for antineoplastic radiation therapy: Secondary | ICD-10-CM | POA: Diagnosis not present

## 2021-02-10 ENCOUNTER — Ambulatory Visit
Admission: RE | Admit: 2021-02-10 | Discharge: 2021-02-10 | Disposition: A | Discharge status: Home / Self Care | Payer: Medicare Other | Source: Ambulatory Visit | Attending: Radiation Oncology | Admitting: Radiation Oncology

## 2021-02-10 ENCOUNTER — Non-Acute Institutional Stay (SKILLED_NURSING_FACILITY): Payer: Medicare Other | Admitting: Nurse Practitioner

## 2021-02-10 ENCOUNTER — Other Ambulatory Visit: Payer: Self-pay

## 2021-02-10 ENCOUNTER — Encounter: Payer: Self-pay | Admitting: Nurse Practitioner

## 2021-02-10 DIAGNOSIS — K219 Gastro-esophageal reflux disease without esophagitis: Secondary | ICD-10-CM | POA: Diagnosis not present

## 2021-02-10 DIAGNOSIS — R7303 Prediabetes: Secondary | ICD-10-CM

## 2021-02-10 DIAGNOSIS — F418 Other specified anxiety disorders: Secondary | ICD-10-CM | POA: Diagnosis not present

## 2021-02-10 DIAGNOSIS — E785 Hyperlipidemia, unspecified: Secondary | ICD-10-CM | POA: Diagnosis not present

## 2021-02-10 DIAGNOSIS — R441 Visual hallucinations: Secondary | ICD-10-CM | POA: Diagnosis not present

## 2021-02-10 DIAGNOSIS — E538 Deficiency of other specified B group vitamins: Secondary | ICD-10-CM | POA: Diagnosis not present

## 2021-02-10 DIAGNOSIS — E039 Hypothyroidism, unspecified: Secondary | ICD-10-CM | POA: Diagnosis not present

## 2021-02-10 DIAGNOSIS — F0151 Vascular dementia with behavioral disturbance: Secondary | ICD-10-CM

## 2021-02-10 DIAGNOSIS — C3412 Malignant neoplasm of upper lobe, left bronchus or lung: Secondary | ICD-10-CM | POA: Diagnosis not present

## 2021-02-10 DIAGNOSIS — R600 Localized edema: Secondary | ICD-10-CM

## 2021-02-10 DIAGNOSIS — G4733 Obstructive sleep apnea (adult) (pediatric): Secondary | ICD-10-CM

## 2021-02-10 DIAGNOSIS — R609 Edema, unspecified: Secondary | ICD-10-CM | POA: Diagnosis not present

## 2021-02-10 DIAGNOSIS — F01518 Vascular dementia, unspecified severity, with other behavioral disturbance: Secondary | ICD-10-CM

## 2021-02-10 DIAGNOSIS — I10 Essential (primary) hypertension: Secondary | ICD-10-CM | POA: Diagnosis not present

## 2021-02-10 DIAGNOSIS — Z510 Encounter for antineoplastic radiation therapy: Secondary | ICD-10-CM | POA: Diagnosis not present

## 2021-02-10 NOTE — Progress Notes (Signed)
Location:   [facility] Room Number: [phone] Place of Service:  SNF [phone]) Provider:  Yenifer Saccente Otho Darner, NP  Virgie Dad, MD  Patient Care Team: Virgie Dad, MD as PCP - General (Internal Medicine) Minus Breeding, MD as PCP - Cardiology (Cardiology) Azucena Fallen, MD as Consulting Physician (Obstetrics and Gynecology) Chesley Mires, MD as Consulting Physician (Pulmonary Disease) Molly Savarino X, NP as Nurse Practitioner (Internal Medicine) Christin Fudge, MD (Inactive) as Consulting Physician (Surgery)  Extended Emergency Contact Information Primary Emergency Contact: Boxman,Lee Address: [address]          [place], [place] [address] Johnnette Litter of [place] Phone: [phone] Relation: Daughter Secondary Emergency Contact: Ritta Slot [place] of [place] Mobile Phone: [phone] Relation: Daughter  Code Status:  DNR Goals of care: Advanced Directive information Advanced Directives 02/10/2021  Does Patient Have a Medical Advance Directive? Yes  Type of Paramedic of [place];Living will;Out of facility DNR (pink MOST or yellow form)  Does patient want to make changes to medical advance directive? No - Patient declined  Copy of [place] in Chart? Yes - validated most recent copy scanned in chart (See row information)  Would patient like information on creating a medical advance directive? -  Pre-existing out of facility DNR order (yellow form or pink MOST form) Yellow form placed in chart (order not valid for inpatient use);Pink MOST form placed in chart (order not valid for inpatient use)     Chief Complaint  Patient presents with   Medical Management of Chronic Issues    Routine follow up   Health Maintenance    Discuss need for urine microalbumin, foot exam, ophthalmology exam, and influenza vaccine.    HPI:  Pt is a 87 y.o. female seen today for medical management of chronic diseases.      Lung mass, SOB, receiving radiation tx, left upper lobe CT angio chest 01/17/21  Hx of chronic edema BLE, takes Metolazone 2.72m qdx5/wk,  536mx2/wk Spironolactone 2518md. Bun/creat 14/0.90 eGFR >60, BNP 59.5 01/17/21             GERD, takes Omeprazole, Hgb 15.0 01/17/21             Hyperlipidemia, LDL 71 04/01/20, , started Zocor             Hypothyroidism, takes Synthroid 137m8md. TSH 4.59 11/25/20             OSA, CPAP             Visual hallucination, takes Quetiapine 12.5mg 48m MRI showed atrophy             Depression/anxiety, takes Sertraline 100mg 51m             CAD ASA 81mg q5m             Vit B12, supplemented with Vit B12             Dementia, high functional, takes Memantine 10mg bi23m          Prediabetes, Hgb a1c. 6.1 11/25/20  HTN, takes Spironolactone, Metolazone.    Past Medical History:  Diagnosis Date   Altered mental status    ARTHRITIS, KNEES, BILATERAL    Breast CA (HCC) [address] s/p R lumpectomy and XRT   CHF (congestive heart failure) (HCC)    DEPRESSION    DJD (degenerative joint disease) of knee  GERD    HYPERLIPIDEMIA    HYPERTENSION    HYPOTHYROIDISM    postsurgical   Hypoxia    MIGRAINE HEADACHE    Morbid obesity ([place])    OSA (obstructive sleep apnea) 01/17/2011 dx   Oxygen dependent    Urge incontinence    UTI (lower urinary tract infection) 12/2014   Past Surgical History:  Procedure Laterality Date   ABDOMINAL HYSTERECTOMY     ADENOIDECTOMY     BREAST BIOPSY  2000   BREAST LUMPECTOMY  2000   CATARACT EXTRACTION     CHOLECYSTECTOMY  1995   CYSTOSCOPY/URETEROSCOPY/HOLMIUM LASER Right 06/28/2015   Procedure: CYSTOSCOPY RIGHT RETROGRAD RIGHT URETEROSCOPY/HOLMIUM LASER WITH RIGHT STENT PLACEMENT;  Surgeon: Alexis Frock, MD;  Location: [facility];  Service: Urology;  Laterality: Right;   PARATHYROIDECTOMY     2-3 removed   REPLACEMENT TOTAL KNEE BILATERAL  1999, 2005   Right leg femur repaired     THYROIDECTOMY     TONSILLECTOMY     TUBAL  LIGATION      Allergies  Allergen Reactions   Atorvastatin Nausea Only   Azithromycin Itching   Beta Adrenergic Blockers Other (See Comments)    Depression    Ciprofloxacin Other (See Comments)    Edgy and very jumpy     Codeine Nausea And Vomiting   Darvon [Propoxyphene] Nausea And Vomiting   Epinephrine Other (See Comments)    Rapid pulse, sweats   Klonopin [Clonazepam] Other (See Comments)    Makes her feel very suicidal    Oxycodone Other (See Comments)    Patient felt like it altered her mental status "Crazy" . Hallucinations later as well.   Paxil [Paroxetine Hydrochloride] Other (See Comments)    Severe depression    Prednisone Other (See Comments)    "makes me feel really bad"   Propoxyphene Hcl Nausea And Vomiting   Tape Itching   Torsemide Other (See Comments)    Patient stated that she doesn't recall the reaction   Tramadol Other (See Comments)    Feels "jittery"   Vicodin [Hydrocodone-Acetaminophen] Other (See Comments)    Hallucinations   Meloxicam Diarrhea   Other Rash    Polyester sheets "cause me a rash"   Vancomycin Rash    Localized rash related to infusion rate    Allergies as of 02/10/2021       Reactions   Atorvastatin Nausea Only   Azithromycin Itching   Beta Adrenergic Blockers Other (See Comments)   Depression   Ciprofloxacin Other (See Comments)   Edgy and very jumpy    Codeine Nausea And Vomiting   Darvon [propoxyphene] Nausea And Vomiting   Epinephrine Other (See Comments)   Rapid pulse, sweats   Klonopin [clonazepam] Other (See Comments)   Makes her feel very suicidal    Oxycodone Other (See Comments)   Patient felt like it altered her mental status "Crazy" . Hallucinations later as well.   Paxil [paroxetine Hydrochloride] Other (See Comments)   Severe depression    Prednisone Other (See Comments)   "makes me feel really bad"   Propoxyphene Hcl Nausea And Vomiting   Tape Itching   Torsemide Other (See Comments)   Patient  stated that she doesn't recall the reaction   Tramadol Other (See Comments)   Feels "jittery"   Vicodin [hydrocodone-acetaminophen] Other (See Comments)   Hallucinations   Meloxicam Diarrhea   Other Rash   Polyester sheets "cause me a rash"   Vancomycin Rash   Localized rash  related to infusion rate        Medication List        Accurate as of February 10, 2021 11:59 PM. If you have any questions, ask your nurse or doctor.          STOP taking these medications    alum & mag hydroxide-simeth 200-200-20 MG/5ML suspension Commonly known as: MAALOX/MYLANTA Stopped by: Zanylah Hardie X Luismario Coston, NP       TAKE these medications    acetaminophen 325 MG tablet Commonly known as: TYLENOL Take 650 mg by mouth every 4 (four) hours as needed.   aspirin 81 MG tablet Take 81 mg by mouth daily.   Citracal Petites/Vitamin D 200-250 MG-UNIT Tabs Generic drug: Calcium Citrate-Vitamin D Take 1 tablet by mouth every morning.   cyanocobalamin 2000 MCG tablet Take 2,000 mcg by mouth daily.   FIBER PO Take 1 tablet by mouth 2 (two) times daily.   ipratropium-albuterol 0.5-2.5 (3) MG/3ML Soln Commonly known as: DUONEB Take 3 mLs by nebulization 2 (two) times daily.   melatonin 5 MG Tabs Take 1 tablet (5 mg total) by mouth at bedtime.   memantine 10 MG tablet Commonly known as: NAMENDA TAKE 1 TABLET BY MOUTH TWICE DAILY.   metolazone 2.5 MG tablet Commonly known as: ZAROXOLYN TAKE 1 TABLET FIVE DAYS A WEEK, TAKE 2 TABLETS ON MONDAY AND FRIDAY   omeprazole 20 MG capsule Commonly known as: PRILOSEC TAKE 1 CAPSULE TWICE DAILY BEFORE MEALS   OXYGEN Inhale 2 L into the lungs continuous. KEEP O2 SATS >= 90%. Every Shift   potassium chloride 10 MEQ CR capsule Commonly known as: MICRO-K TAKE 4 CAPSULES DAILY.   QUEtiapine 25 MG tablet Commonly known as: SEROQUEL TAKE 1/2 TABLET AT BEDTIME.   saccharomyces boulardii 250 MG capsule Commonly known as: FLORASTOR Take 1 capsule (250 mg  total) by mouth daily.   sertraline 100 MG tablet Commonly known as: ZOLOFT TAKE ONE TABLET BY MOUTH AT BEDTIME   spironolactone 25 MG tablet Commonly known as: ALDACTONE TAKE 1 TABLET ONCE DAILY.   Synthroid 137 MCG tablet Generic drug: levothyroxine TAKE 1 TABLET ONCE DAILY BEFORE BREAKFAST.        Review of Systems  Constitutional:  Negative for fatigue, fever and unexpected weight change.  HENT:  Positive for hearing loss. Negative for congestion and voice change.   Eyes:  Negative for visual disturbance.  Respiratory:  Positive for cough and shortness of breath. Negative for choking.        Chronic hacking cough. DOE  Cardiovascular:  Positive for leg swelling. Negative for chest pain and palpitations.  Gastrointestinal:  Negative for abdominal pain and diarrhea.       Chronic, occasional diarrhea  Endocrine:       Get cold easily  Genitourinary:  Negative for dysuria and urgency.  Musculoskeletal:  Positive for arthralgias and gait problem.       Pain in the tip of the left shoulder, worsens with pulling, pushing, or overhead movement.   Skin:  Negative for color change.  Neurological:  Negative for speech difficulty, weakness and light-headedness.  Psychiatric/Behavioral:  Negative for behavioral problems, hallucinations and sleep disturbance. The patient is not nervous/anxious.    Immunization History  Administered Date(s) Administered   Influenza Split 03/20/2011   Influenza Whole 03/19/2009, 03/19/2012   Influenza, High Dose Seasonal PF 03/16/2018, 04/20/2019, 03/31/2020   Influenza,inj,Quad PF,6+ Mos 03/19/2013   Influenza-Unspecified 03/03/2014, 03/15/2015, 03/30/2016, 04/05/2017   Meningococcal Polysaccharide 03/15/2015  Moderna Sars-Covid-2 Vaccination 06/22/2018, 07/21/2019, 04/27/2020, 11/16/2020   PPD Test 03/30/2016   Pneumococcal Conjugate-13 03/12/2014   Pneumococcal Polysaccharide-23 06/19/2006, 03/15/2015   Td 10/04/2009   Tdap 06/19/2005,  11/14/2018   Zoster Recombinat (Shingrix) 01/28/2018, 04/22/2018, 05/22/2018   Zoster, Live 06/19/2010   Pertinent  Health Maintenance Due  Topic Date Due   URINE MICROALBUMIN  Never done   FOOT EXAM  06/19/2020   OPHTHALMOLOGY EXAM  07/20/2020   INFLUENZA VACCINE  01/17/2021   HEMOGLOBIN A1C  05/27/2021   PNA vac Low Risk Adult  Completed   DEXA SCAN  Discontinued   Fall Risk  08/06/2020 08/05/2020 05/06/2020 02/06/2020 01/01/2020  Falls in the past year? 0 0 0 0 0  Number falls in past yr: 0 0 0 0 0  Injury with Fall? - 0 - - -  Risk for fall due to : - - - - -   Functional Status Survey:    Vitals:   02/10/21 1005  BP: 122/66  Pulse: 100  Resp: 17  Temp: 98.2 F (36.8 C)  SpO2: 95%  Weight: 261 lb 4.8 oz (118.5 kg)  Height: _0  (1.702 m)   Body mass index is 40.93 kg/m. Physical Exam Vitals and nursing note reviewed.  Constitutional:      Appearance: Normal appearance.  HENT:     Head: Normocephalic and atraumatic.     Mouth/Throat:     Mouth: Mucous membranes are moist.  Eyes:     Extraocular Movements: Extraocular movements intact.     Conjunctiva/sclera: Conjunctivae normal.     Pupils: Pupils are equal, round, and reactive to light.  Cardiovascular:     Rate and Rhythm: Normal rate and regular rhythm.     Heart sounds: No murmur heard. Pulmonary:     Effort: Pulmonary effort is normal.     Breath sounds: Rales present. No rhonchi.     Comments: Right basilar rales.  Abdominal:     General: Bowel sounds are normal.     Palpations: Abdomen is soft.     Tenderness: There is no abdominal tenderness.  Genitourinary:    Vagina: No vaginal discharge.     Rectum: Guaiac result negative.     Comments: Small external hemorrhoids 5-6pm, no injury.  Musculoskeletal:     Cervical back: Normal range of motion and neck supple.     Right lower leg: Edema present.     Left lower leg: Edema present.     Comments: Trace edema BLE.   Skin:    General: Skin is  warm and dry.     Comments: Brownish venous insufficiency skin changes BLE  Neurological:     General: No focal deficit present.     Mental Status: She is alert and oriented to person, place, and time. Mental status is at baseline.     Gait: Gait abnormal.  Psychiatric:        Mood and Affect: Mood normal.        Behavior: Behavior normal.        Thought Content: Thought content normal.        Judgment: Judgment normal.    Labs reviewed: Recent Labs    04/01/20 0000 11/25/20 0740 01/05/21 0406 01/06/21 0524 01/17/21 0737  NA 140   < > 140 140 136  K 4.1   < > 4.7 3.7 3.7  CL 101   < > 98 94* 95*  CO2 31   < > 33* 35* 33*  GLUCOSE 78   < > 115* 89 102*  BUN 13   < > 26* 17 14  CREATININE 0.98*   < > 0.99 0.70 0.90  CALCIUM 8.5*   < > 9.1 8.9 8.9  MG 1.5  --   --   --   --    < > = values in this interval not displayed.   Recent Labs    04/01/20 0000 11/25/20 0740 01/06/21 0524  AST 13 12 12*  ALT _0 ALKPHOS  --   --  69  BILITOT 0.6 0.4 0.7  PROT 6.1 6.2 6.1*  ALBUMIN  --   --  3.0*   Recent Labs    11/25/20 0740 01/01/21 0157 01/05/21 0406 01/06/21 0524 01/09/21 0600 01/17/21 0737  WBC 6.7 8.0 7.3 8.3 6.5 8.4  NEUTROABS 4,456 5.7 5.3  --   --   --   HGB 13.3 13.9 12.9 12.5 13.8 15.0  HCT 41.2 44.7 41.5 40.4 44.3 46.8*  MCV 87.7 89.8 91.6 91.6 89.3 89.1  PLT 116* 129* 118* 110* 140* PLATELET CLUMPS NOTED ON SMEAR, UNABLE TO ESTIMATE   Lab Results  Component Value Date   TSH 4.59 (H) 11/25/2020   Lab Results  Component Value Date   HGBA1C 6.1 (H) 11/25/2020   Lab Results  Component Value Date   CHOL 145 04/01/2020   HDL 56 04/01/2020   LDLCALC 71 04/01/2020   LDLDIRECT 99.4 03/13/2012   TRIG 95 04/01/2020   CHOLHDL 2.6 04/01/2020    Significant Diagnostic Results in last 30 days:  DG Chest 2 View  Result Date: 01/17/2021 CLINICAL DATA:  87 year old female with chest pressure and shortness of breath. EXAM: CHEST - 2 VIEW COMPARISON:   Chest x-ray 01/05/2021. FINDINGS: Persistent masslike density in the central aspect of the left upper lobe with associated postobstructive airspace consolidation in the left upper lobe. Right lung is clear. No pleural effusions. No pneumothorax. No evidence of pulmonary edema. Heart size is normal. The patient is rotated to the left on today's exam, resulting in distortion of the mediastinal contours and reduced diagnostic sensitivity and specificity for mediastinal pathology. Atherosclerotic calcifications in the thoracic aorta. IMPRESSION: 1. Persistent central left upper lobe mass with postobstructive atelectasis/consolidation in the left upper lobe. Findings remain highly concerning for primary bronchogenic neoplasm. 2. Aortic atherosclerosis. Electronically Signed   By: Vinnie Langton M.D.   On: 01/17/2021 07:30   CT Angio Chest PE W/Cm &/Or Wo Cm  Result Date: 01/17/2021 CLINICAL DATA:  Shortness of breath EXAM: CT ANGIOGRAPHY CHEST WITH CONTRAST TECHNIQUE: Multidetector CT imaging of the chest was performed using the standard protocol during bolus administration of intravenous contrast. Multiplanar CT image reconstructions and MIPs were obtained to evaluate the vascular anatomy. CONTRAST:  15m OMNIPAQUE IOHEXOL 350 MG/ML SOLN COMPARISON:  01/05/2021 FINDINGS: Cardiovascular: No filling defects in the pulmonary arteries to suggest pulmonary emboli. Heart is borderline in size. Scattered aortic and coronary artery calcifications. Mediastinum/Nodes: Mediastinal and left hilar adenopathy again noted, unchanged. Index AP window lymph node has a short axis diameter of 11 mm, stable. AP window/left hilar lymph node has a short axis diameter of 19 mm on image 32, stable. Lungs/Pleura: Soft tissue mass in the left suprahilar region again noted, encasing the left pulmonary artery and causing occlusion of the left upper lobe bronchus. Continued complete collapse of the left upper lobe. Small left pleural effusion.  Findings are stable since recent study. No confluent opacity on the  right. Upper Abdomen: Imaging into the upper abdomen demonstrates no acute findings. Musculoskeletal: Chest wall soft tissues are unremarkable. No acute bony abnormality. Degenerative changes throughout the thoracic spine. Review of the MIP images confirms the above findings. IMPRESSION: Stable left suprahilar mass encasing the left pulmonary artery and left upper lobe bronchus with obliteration of the left upper lobe bronchus and complete collapse of the left upper lobe. Findings compatible with neoplasm. Stable so stated mediastinal and left hilar adenopathy. Small left pleural effusion. No evidence of pulmonary embolus. Aortic Atherosclerosis (ICD10-I70.0). Electronically Signed   By: Rolm Baptise M.D.   On: 01/17/2021 10:05    Assessment/Plan Peripheral edema Hx of chronic edema BLE, takes Metolazone 2.64m qdx5/wk,  575mx2/wk Spironolactone 2562md. Bun/creat 14/0.90 eGFR >60, BNP 59.5 01/17/21  GERD (gastroesophageal reflux disease)  takes Omeprazole, Hgb 15.0 01/17/21  Hyperlipidemia LDL goal <70 LDL 71 04/01/20, , started Zocor  Hypothyroidism (acquired) takes Synthroid 137m95md. TSH 4.59 11/25/20  OSA (obstructive sleep apnea)  CPAP  Hallucination, visual takes Quetiapine 12.5mg 84m MRI showed atrophy  Depression with anxiety akes Sertraline 100mg 28m  Vitamin B12 deficiency supplemented with Vit B12  Vascular dementia (HCC) hBunnlevel functional, takes Memantine 10mg b57mPrediabetes Hgb a1c. 6.1 11/25/20  Essential hypertension Blood pressure is controlled. takes Spironolactone, Metolazone.     Family/ staff Communication: plan of care reviewed with the patient and charge nurse.   Labs/tests ordered:  none  Time spend 35 minutes.

## 2021-02-11 ENCOUNTER — Ambulatory Visit
Admission: RE | Admit: 2021-02-11 | Discharge: 2021-02-11 | Disposition: A | Discharge status: Home / Self Care | Payer: Medicare Other | Source: Ambulatory Visit | Attending: Radiation Oncology | Admitting: Radiation Oncology

## 2021-02-11 DIAGNOSIS — C3412 Malignant neoplasm of upper lobe, left bronchus or lung: Secondary | ICD-10-CM | POA: Diagnosis not present

## 2021-02-11 DIAGNOSIS — Z510 Encounter for antineoplastic radiation therapy: Secondary | ICD-10-CM | POA: Diagnosis not present

## 2021-02-11 DIAGNOSIS — R609 Edema, unspecified: Secondary | ICD-10-CM | POA: Insufficient documentation

## 2021-02-11 NOTE — Assessment & Plan Note (Signed)
CPAP.  

## 2021-02-11 NOTE — Assessment & Plan Note (Signed)
LDL 71 04/01/20, , started Zocor

## 2021-02-11 NOTE — Assessment & Plan Note (Signed)
takes Synthroid 153mcg qd. TSH 4.59 11/25/20

## 2021-02-11 NOTE — Assessment & Plan Note (Signed)
takes Omeprazole, Hgb 15.0 01/17/21

## 2021-02-11 NOTE — Assessment & Plan Note (Signed)
Hx of chronic edema BLE, takes Metolazone 2.2m qdx5/wk,  537mx2/wk Spironolactone 2523md. Bun/creat 14/0.90 eGFR >60, BNP 59.5 01/17/21

## 2021-02-11 NOTE — Assessment & Plan Note (Signed)
Blood pressure is controlled. takes Spironolactone, Metolazone.

## 2021-02-11 NOTE — Assessment & Plan Note (Signed)
supplemented with Vit B12

## 2021-02-11 NOTE — Assessment & Plan Note (Signed)
akes Sertraline $RemoveBeforeD'100mg'[id number]$  qd.

## 2021-02-11 NOTE — Assessment & Plan Note (Signed)
Hgb a1c. 6.1 11/25/20

## 2021-02-11 NOTE — Assessment & Plan Note (Signed)
high functional, takes Memantine $RemoveBeforeD'10mg'[id number]$  bid

## 2021-02-11 NOTE — Assessment & Plan Note (Signed)
takes Quetiapine 12.5mg  qd, MRI showed atrophy

## 2021-02-14 ENCOUNTER — Ambulatory Visit
Admission: RE | Admit: 2021-02-14 | Discharge: 2021-02-14 | Disposition: A | Discharge status: Home / Self Care | Payer: Medicare Other | Source: Ambulatory Visit | Attending: Radiation Oncology | Admitting: Radiation Oncology

## 2021-02-14 ENCOUNTER — Other Ambulatory Visit: Payer: Self-pay

## 2021-02-14 ENCOUNTER — Encounter: Payer: Self-pay | Admitting: Nurse Practitioner

## 2021-02-14 DIAGNOSIS — Z510 Encounter for antineoplastic radiation therapy: Secondary | ICD-10-CM | POA: Diagnosis not present

## 2021-02-14 DIAGNOSIS — C3412 Malignant neoplasm of upper lobe, left bronchus or lung: Secondary | ICD-10-CM | POA: Diagnosis not present

## 2021-02-15 ENCOUNTER — Other Ambulatory Visit: Payer: Self-pay

## 2021-02-15 ENCOUNTER — Encounter: Payer: Self-pay | Admitting: Internal Medicine

## 2021-02-15 ENCOUNTER — Ambulatory Visit
Admission: RE | Admit: 2021-02-15 | Discharge: 2021-02-15 | Disposition: A | Discharge status: Home / Self Care | Payer: Medicare Other | Source: Ambulatory Visit | Attending: Radiation Oncology | Admitting: Radiation Oncology

## 2021-02-15 DIAGNOSIS — C3412 Malignant neoplasm of upper lobe, left bronchus or lung: Secondary | ICD-10-CM | POA: Diagnosis not present

## 2021-02-15 DIAGNOSIS — Z510 Encounter for antineoplastic radiation therapy: Secondary | ICD-10-CM | POA: Diagnosis not present

## 2021-02-15 NOTE — Progress Notes (Signed)
Location:   [facility] Room Number: 43 Place of Service:  SNF [phone]) Provider:  Veleta Miners MD  Virgie Dad, MD  Patient Care Team: Virgie Dad, MD as PCP - General (Internal Medicine) Minus Breeding, MD as PCP - Cardiology (Cardiology) Azucena Fallen, MD as Consulting Physician (Obstetrics and Gynecology) Chesley Mires, MD as Consulting Physician (Pulmonary Disease) Mast, Man X, NP as Nurse Practitioner (Internal Medicine) Christin Fudge, MD (Inactive) as Consulting Physician (Surgery)  Extended Emergency Contact Information Primary Emergency Contact: Boxman,Lee Address: [address]          [place], [place] [address] Johnnette Litter of [place] Phone: [phone] Relation: Daughter Secondary Emergency Contact: Ritta Slot [place] of [place] Mobile Phone: [phone] Relation: Daughter  Code Status:  DNR Goals of care: Advanced Directive information Advanced Directives 02/15/2021  Does Patient Have a Medical Advance Directive? Yes  Type of Paramedic of [place];Living will;Out of facility DNR (pink MOST or yellow form)  Does patient want to make changes to medical advance directive? No - Patient declined  Copy of [employer] in Chart? Yes - validated most recent copy scanned in chart (See row information)  Would patient like information on creating a medical advance directive? -  Pre-existing out of facility DNR order (yellow form or pink MOST form) Yellow form placed in chart (order not valid for inpatient use);Pink MOST form placed in chart (order not valid for inpatient use)     Chief Complaint  Patient presents with   Acute Visit    Oxygen requirement    HPI:  Pt is a 87 y.o. female seen today for an acute visit for    Past Medical History:  Diagnosis Date   Altered mental status    ARTHRITIS, KNEES, BILATERAL    Breast CA ([facility]) 2000   s/p R lumpectomy and XRT   CHF  (congestive heart failure) (HCC)    DEPRESSION    DJD (degenerative joint disease) of knee    GERD    HYPERLIPIDEMIA    HYPERTENSION    HYPOTHYROIDISM    postsurgical   Hypoxia    MIGRAINE HEADACHE    Morbid obesity ([place])    OSA (obstructive sleep apnea) 01/17/2011 dx   Oxygen dependent    Urge incontinence    UTI (lower urinary tract infection) 12/2014   Past Surgical History:  Procedure Laterality Date   ABDOMINAL HYSTERECTOMY     ADENOIDECTOMY     BREAST BIOPSY  2000   BREAST LUMPECTOMY  2000   CATARACT EXTRACTION     CHOLECYSTECTOMY  1995   CYSTOSCOPY/URETEROSCOPY/HOLMIUM LASER Right 06/28/2015   Procedure: CYSTOSCOPY RIGHT RETROGRAD RIGHT URETEROSCOPY/HOLMIUM LASER WITH RIGHT STENT PLACEMENT;  Surgeon: Alexis Frock, MD;  Location: [facility];  Service: Urology;  Laterality: Right;   PARATHYROIDECTOMY     2-3 removed   REPLACEMENT TOTAL KNEE BILATERAL  1999, 2005   Right leg femur repaired     THYROIDECTOMY     TONSILLECTOMY     TUBAL LIGATION      Allergies  Allergen Reactions   Atorvastatin Nausea Only   Azithromycin Itching   Beta Adrenergic Blockers Other (See Comments)    Depression    Ciprofloxacin Other (See Comments)    Edgy and very jumpy     Codeine Nausea And Vomiting   Darvon [Propoxyphene] Nausea And Vomiting   Epinephrine Other (See Comments)    Rapid pulse, sweats   Klonopin [Clonazepam]  Other (See Comments)    Makes her feel very suicidal    Oxycodone Other (See Comments)    Patient felt like it altered her mental status "Crazy" . Hallucinations later as well.   Paxil [Paroxetine Hydrochloride] Other (See Comments)    Severe depression    Prednisone Other (See Comments)    "makes me feel really bad"   Propoxyphene Hcl Nausea And Vomiting   Tape Itching   Torsemide Other (See Comments)    Patient stated that she doesn't recall the reaction   Tramadol Other (See Comments)    Feels "jittery"   Vicodin [Hydrocodone-Acetaminophen] Other (See  Comments)    Hallucinations   Meloxicam Diarrhea   Other Rash    Polyester sheets "cause me a rash"   Vancomycin Rash    Localized rash related to infusion rate    Allergies as of 02/15/2021       Reactions   Atorvastatin Nausea Only   Azithromycin Itching   Beta Adrenergic Blockers Other (See Comments)   Depression   Ciprofloxacin Other (See Comments)   Edgy and very jumpy    Codeine Nausea And Vomiting   Darvon [propoxyphene] Nausea And Vomiting   Epinephrine Other (See Comments)   Rapid pulse, sweats   Klonopin [clonazepam] Other (See Comments)   Makes her feel very suicidal    Oxycodone Other (See Comments)   Patient felt like it altered her mental status "Crazy" . Hallucinations later as well.   Paxil [paroxetine Hydrochloride] Other (See Comments)   Severe depression    Prednisone Other (See Comments)   "makes me feel really bad"   Propoxyphene Hcl Nausea And Vomiting   Tape Itching   Torsemide Other (See Comments)   Patient stated that she doesn't recall the reaction   Tramadol Other (See Comments)   Feels "jittery"   Vicodin [hydrocodone-acetaminophen] Other (See Comments)   Hallucinations   Meloxicam Diarrhea   Other Rash   Polyester sheets "cause me a rash"   Vancomycin Rash   Localized rash related to infusion rate        Medication List        Accurate as of February 15, 2021 11:41 AM. If you have any questions, ask your nurse or doctor.          STOP taking these medications    acetaminophen 325 MG tablet Commonly known as: TYLENOL Stopped by: Virgie Dad, MD       TAKE these medications    aspirin 81 MG tablet Take 81 mg by mouth daily.   Citracal Petites/Vitamin D 200-250 MG-UNIT Tabs Generic drug: Calcium Citrate-Vitamin D Take 1 tablet by mouth every morning.   cyanocobalamin 2000 MCG tablet Take 2,000 mcg by mouth daily.   FIBER PO Take 1 tablet by mouth 2 (two) times daily.   ipratropium-albuterol 0.5-2.5 (3) MG/3ML  Soln Commonly known as: DUONEB Take 3 mLs by nebulization 2 (two) times daily.   melatonin 5 MG Tabs Take 1 tablet (5 mg total) by mouth at bedtime.   memantine 10 MG tablet Commonly known as: NAMENDA TAKE 1 TABLET BY MOUTH TWICE DAILY.   metolazone 2.5 MG tablet Commonly known as: ZAROXOLYN TAKE 1 TABLET FIVE DAYS A WEEK, TAKE 2 TABLETS ON MONDAY AND FRIDAY   nystatin powder Generic drug: nystatin Apply 1 application topically as needed.   omeprazole 20 MG capsule Commonly known as: PRILOSEC TAKE 1 CAPSULE TWICE DAILY BEFORE MEALS   OXYGEN Inhale 2 L into the  lungs continuous. KEEP O2 SATS >= 90%. Every Shift   potassium chloride 10 MEQ CR capsule Commonly known as: MICRO-K TAKE 4 CAPSULES DAILY.   QUEtiapine 25 MG tablet Commonly known as: SEROQUEL TAKE 1/2 TABLET AT BEDTIME.   saccharomyces boulardii 250 MG capsule Commonly known as: FLORASTOR Take 1 capsule (250 mg total) by mouth daily.   sertraline 100 MG tablet Commonly known as: ZOLOFT TAKE ONE TABLET BY MOUTH AT BEDTIME   spironolactone 25 MG tablet Commonly known as: ALDACTONE TAKE 1 TABLET ONCE DAILY.   Synthroid 137 MCG tablet Generic drug: levothyroxine TAKE 1 TABLET ONCE DAILY BEFORE BREAKFAST.        Review of Systems  Immunization History  Administered Date(s) Administered   Influenza Split 03/20/2011   Influenza Whole 03/19/2009, 03/19/2012   Influenza, High Dose Seasonal PF 03/16/2018, 04/20/2019, 03/31/2020   Influenza,inj,Quad PF,6+ Mos 03/19/2013   Influenza-Unspecified 03/03/2014, 03/15/2015, 03/30/2016, 04/05/2017   Meningococcal Polysaccharide 03/15/2015   Moderna Sars-Covid-2 Vaccination 06/22/2018, 07/21/2019, 04/27/2020, 11/16/2020   PPD Test 03/30/2016   Pneumococcal Conjugate-13 03/12/2014   Pneumococcal Polysaccharide-23 06/19/2006, 03/15/2015   Td 10/04/2009   Tdap 06/19/2005, 11/14/2018   Zoster Recombinat (Shingrix) 01/28/2018, 04/22/2018, 05/22/2018   Zoster,  Live 06/19/2010   Pertinent  Health Maintenance Due  Topic Date Due   URINE MICROALBUMIN  Never done   FOOT EXAM  06/19/2020   OPHTHALMOLOGY EXAM  07/20/2020   INFLUENZA VACCINE  01/17/2021   HEMOGLOBIN A1C  05/27/2021   PNA vac Low Risk Adult  Completed   DEXA SCAN  Discontinued   Fall Risk  08/06/2020 08/05/2020 05/06/2020 02/06/2020 01/01/2020  Falls in the past year? 0 0 0 0 0  Number falls in past yr: 0 0 0 0 0  Injury with Fall? - 0 - - -  Risk for fall due to : - - - - -   Functional Status Survey:    Vitals:   02/15/21 1125  BP: 122/66  Pulse: 92  Resp: 17  Temp: 97.7 F (36.5 C)  SpO2: 94%  Weight: 261 lb 4.8 oz (118.5 kg)  Height: $Remove'5\' 8"'UMsqhFX$  (1.727 m)   Body mass index is 39.73 kg/m. Physical Exam  Labs reviewed: Recent Labs    04/01/20 0000 11/25/20 0740 01/05/21 0406 01/06/21 0524 01/17/21 0737  NA 140   < > 140 140 136  K 4.1   < > 4.7 3.7 3.7  CL 101   < > 98 94* 95*  CO2 31   < > 33* 35* 33*  GLUCOSE 78   < > 115* 89 102*  BUN 13   < > 26* 17 14  CREATININE 0.98*   < > 0.99 0.70 0.90  CALCIUM 8.5*   < > 9.1 8.9 8.9  MG 1.5  --   --   --   --    < > = values in this interval not displayed.   Recent Labs    04/01/20 0000 11/25/20 0740 01/06/21 0524  AST 13 12 12*  ALT $Re'8 7 11  'Mox$ ALKPHOS  --   --  69  BILITOT 0.6 0.4 0.7  PROT 6.1 6.2 6.1*  ALBUMIN  --   --  3.0*   Recent Labs    11/25/20 0740 01/01/21 0157 01/05/21 0406 01/06/21 0524 01/09/21 0600 01/17/21 0737  WBC 6.7 8.0 7.3 8.3 6.5 8.4  NEUTROABS 4,456 5.7 5.3  --   --   --   HGB 13.3 13.9 12.9 12.5 13.8  15.0  HCT 41.2 44.7 41.5 40.4 44.3 46.8*  MCV 87.7 89.8 91.6 91.6 89.3 89.1  PLT 116* 129* 118* 110* 140* PLATELET CLUMPS NOTED ON SMEAR, UNABLE TO ESTIMATE   Lab Results  Component Value Date   TSH 4.59 (H) 11/25/2020   Lab Results  Component Value Date   HGBA1C 6.1 (H) 11/25/2020   Lab Results  Component Value Date   CHOL 145 04/01/2020   HDL 56 04/01/2020   LDLCALC  71 04/01/2020   LDLDIRECT 99.4 03/13/2012   TRIG 95 04/01/2020   CHOLHDL 2.6 04/01/2020    Significant Diagnostic Results in last 30 days:  DG Chest 2 View  Result Date: 01/17/2021 CLINICAL DATA:  87 year old female with chest pressure and shortness of breath. EXAM: CHEST - 2 VIEW COMPARISON:  Chest x-ray 01/05/2021. FINDINGS: Persistent masslike density in the central aspect of the left upper lobe with associated postobstructive airspace consolidation in the left upper lobe. Right lung is clear. No pleural effusions. No pneumothorax. No evidence of pulmonary edema. Heart size is normal. The patient is rotated to the left on today's exam, resulting in distortion of the mediastinal contours and reduced diagnostic sensitivity and specificity for mediastinal pathology. Atherosclerotic calcifications in the thoracic aorta. IMPRESSION: 1. Persistent central left upper lobe mass with postobstructive atelectasis/consolidation in the left upper lobe. Findings remain highly concerning for primary bronchogenic neoplasm. 2. Aortic atherosclerosis. Electronically Signed   By: Vinnie Langton M.D.   On: 01/17/2021 07:30   CT Angio Chest PE W/Cm &/Or Wo Cm  Result Date: 01/17/2021 CLINICAL DATA:  Shortness of breath EXAM: CT ANGIOGRAPHY CHEST WITH CONTRAST TECHNIQUE: Multidetector CT imaging of the chest was performed using the standard protocol during bolus administration of intravenous contrast. Multiplanar CT image reconstructions and MIPs were obtained to evaluate the vascular anatomy. CONTRAST:  41mL OMNIPAQUE IOHEXOL 350 MG/ML SOLN COMPARISON:  01/05/2021 FINDINGS: Cardiovascular: No filling defects in the pulmonary arteries to suggest pulmonary emboli. Heart is borderline in size. Scattered aortic and coronary artery calcifications. Mediastinum/Nodes: Mediastinal and left hilar adenopathy again noted, unchanged. Index AP window lymph node has a short axis diameter of 11 mm, stable. AP window/left hilar lymph  node has a short axis diameter of 19 mm on image 32, stable. Lungs/Pleura: Soft tissue mass in the left suprahilar region again noted, encasing the left pulmonary artery and causing occlusion of the left upper lobe bronchus. Continued complete collapse of the left upper lobe. Small left pleural effusion. Findings are stable since recent study. No confluent opacity on the right. Upper Abdomen: Imaging into the upper abdomen demonstrates no acute findings. Musculoskeletal: Chest wall soft tissues are unremarkable. No acute bony abnormality. Degenerative changes throughout the thoracic spine. Review of the MIP images confirms the above findings. IMPRESSION: Stable left suprahilar mass encasing the left pulmonary artery and left upper lobe bronchus with obliteration of the left upper lobe bronchus and complete collapse of the left upper lobe. Findings compatible with neoplasm. Stable so stated mediastinal and left hilar adenopathy. Small left pleural effusion. No evidence of pulmonary embolus. Aortic Atherosclerosis (ICD10-I70.0). Electronically Signed   By: Rolm Baptise M.D.   On: 01/17/2021 10:05    Assessment/Plan There are no diagnoses linked to this encounter.   Family/ staff Communication:   Labs/tests ordered:

## 2021-02-16 ENCOUNTER — Ambulatory Visit
Admission: RE | Admit: 2021-02-16 | Discharge: 2021-02-16 | Disposition: A | Discharge status: Home / Self Care | Payer: Medicare Other | Source: Ambulatory Visit | Attending: Radiation Oncology | Admitting: Radiation Oncology

## 2021-02-16 ENCOUNTER — Non-Acute Institutional Stay: Payer: Medicare Other | Admitting: Internal Medicine

## 2021-02-16 ENCOUNTER — Encounter: Payer: Self-pay | Admitting: Internal Medicine

## 2021-02-16 VITALS — BP 128/70 | HR 99 | Temp 96.8°F | Ht 68.0 in | Wt 261.4 lb

## 2021-02-16 DIAGNOSIS — R918 Other nonspecific abnormal finding of lung field: Secondary | ICD-10-CM

## 2021-02-16 DIAGNOSIS — J40 Bronchitis, not specified as acute or chronic: Secondary | ICD-10-CM | POA: Diagnosis not present

## 2021-02-16 DIAGNOSIS — C3412 Malignant neoplasm of upper lobe, left bronchus or lung: Secondary | ICD-10-CM | POA: Diagnosis not present

## 2021-02-16 DIAGNOSIS — Z510 Encounter for antineoplastic radiation therapy: Secondary | ICD-10-CM | POA: Diagnosis not present

## 2021-02-16 DIAGNOSIS — I5032 Chronic diastolic (congestive) heart failure: Secondary | ICD-10-CM

## 2021-02-17 ENCOUNTER — Ambulatory Visit: Payer: Medicare Other

## 2021-02-17 DIAGNOSIS — C3412 Malignant neoplasm of upper lobe, left bronchus or lung: Secondary | ICD-10-CM | POA: Diagnosis not present

## 2021-02-17 NOTE — Progress Notes (Signed)
Location: [facility] Service:  Clinic (12)  Provider:   Code Status:  Goals of Care:  Advanced Directives 02/15/2021  Does Patient Have a Medical Advance Directive? Yes  Type of Estate agent of [place];Living will;Out of facility DNR (pink MOST or yellow form)  Does patient want to make changes to medical advance directive? No - Patient declined  Copy of Healthcare Power of Attorney in Chart? Yes - validated most recent copy scanned in chart (See row information)  Would patient like information on creating a medical advance directive? -  Pre-existing out of facility DNR order (yellow form or pink MOST form) Yellow form placed in chart (order not valid for inpatient use);Pink MOST form placed in chart (order not valid for inpatient use)     Chief Complaint  Patient presents with   Acute Visit    Cough, Covid test (negative)    HPI: Patient is a 87 y.o. female seen today for an acute visit for SOB and Cough  Patient has h/o Hypertension,  Chronic LE edema with Diastolic CHF and Lymphedema, Anxiety, B12 def, OSA but does not use CPAP only Oxygen , Diabetes Type 2, Diarrhea chronic,Work up with GI negative Visual Hallucinations Work up with Neurology Possible Neurodegenerative disease Admitted from 7/20-7/25 for Left Upper lobe Collapse due to Malignancy And Now on Palliative Radiation Therapy per Dr Mitzi Hansen   She went for her usual Radiation Therapy today but was noticed by the staff having Cough and SOB  Was told to go to her PCP office So she came to the  Clinic in [place] with her daughter Cough is congested but cannot cough up No Fever or Chest pain SOB at her baseline LE swelling has increased and she has Gained 5 lbs.    Past Medical History:  Diagnosis Date   Altered mental status    ARTHRITIS, KNEES, BILATERAL    Breast CA (HCC) 2000   s/p R lumpectomy and XRT   CHF (congestive heart failure) (HCC)    DEPRESSION    DJD  (degenerative joint disease) of knee    GERD    HYPERLIPIDEMIA    HYPERTENSION    HYPOTHYROIDISM    postsurgical   Hypoxia    MIGRAINE HEADACHE    Morbid obesity (HCC)    OSA (obstructive sleep apnea) 01/17/2011 dx   Oxygen dependent    Urge incontinence    UTI (lower urinary tract infection) 12/2014    Past Surgical History:  Procedure Laterality Date   ABDOMINAL HYSTERECTOMY     ADENOIDECTOMY     BREAST BIOPSY  2000   BREAST LUMPECTOMY  2000   CATARACT EXTRACTION     CHOLECYSTECTOMY  1995   CYSTOSCOPY/URETEROSCOPY/HOLMIUM LASER Right 06/28/2015   Procedure: CYSTOSCOPY RIGHT RETROGRAD RIGHT URETEROSCOPY/HOLMIUM LASER WITH RIGHT STENT PLACEMENT;  Surgeon: Sebastian Ache, MD;  Location: [facility];  Service: Urology;  Laterality: Right;   PARATHYROIDECTOMY     2-3 removed   REPLACEMENT TOTAL KNEE BILATERAL  1999, 2005   Right leg femur repaired     THYROIDECTOMY     TONSILLECTOMY     TUBAL LIGATION      Allergies  Allergen Reactions   Atorvastatin Nausea Only   Azithromycin Itching   Beta Adrenergic Blockers Other (See Comments)    Depression    Ciprofloxacin Other (See Comments)    Edgy and very jumpy     Codeine Nausea And Vomiting   Darvon [Propoxyphene] Nausea  And Vomiting   Epinephrine Other (See Comments)    Rapid pulse, sweats   Klonopin [Clonazepam] Other (See Comments)    Makes her feel very suicidal    Oxycodone Other (See Comments)    Patient felt like it altered her mental status "Crazy" . Hallucinations later as well.   Paxil [Paroxetine Hydrochloride] Other (See Comments)    Severe depression    Prednisone Other (See Comments)    "makes me feel really bad"   Propoxyphene Hcl Nausea And Vomiting   Tape Itching   Torsemide Other (See Comments)    Patient stated that she doesn't recall the reaction   Tramadol Other (See Comments)    Feels "jittery"   Vicodin [Hydrocodone-Acetaminophen] Other (See Comments)    Hallucinations   Meloxicam Diarrhea    Other Rash    Polyester sheets "cause me a rash"   Vancomycin Rash    Localized rash related to infusion rate    Outpatient Encounter Medications as of 02/16/2021  Medication Sig   aspirin 81 MG tablet Take 81 mg by mouth daily.   CITRACAL PETITES/VITAMIN D 200-250 MG-UNIT TABS Take 1 tablet by mouth every morning.   cyanocobalamin 2000 MCG tablet Take 2,000 mcg by mouth daily.   FIBER PO Take 1 tablet by mouth 2 (two) times daily.   ipratropium-albuterol (DUONEB) 0.5-2.5 (3) MG/3ML SOLN Take 3 mLs by nebulization daily.   Melatonin 5 MG TABS Take 1 tablet (5 mg total) by mouth at bedtime.   memantine (NAMENDA) 10 MG tablet TAKE 1 TABLET BY MOUTH TWICE DAILY.   metolazone (ZAROXOLYN) 2.5 MG tablet TAKE 1 TABLET FIVE DAYS A WEEK, TAKE 2 TABLETS ON MONDAY AND FRIDAY   nystatin (MYCOSTATIN/NYSTOP) powder Apply 1 application topically as needed.   omeprazole (PRILOSEC) 20 MG capsule TAKE 1 CAPSULE TWICE DAILY BEFORE MEALS   OXYGEN Inhale 4 L into the lungs continuous. KEEP O2 SATS >= 90%. Every Shift   potassium chloride (MICRO-K) 10 MEQ CR capsule TAKE 4 CAPSULES DAILY.   QUEtiapine (SEROQUEL) 25 MG tablet TAKE 1/2 TABLET AT BEDTIME.   saccharomyces boulardii (FLORASTOR) 250 MG capsule Take 1 capsule (250 mg total) by mouth daily.   sertraline (ZOLOFT) 100 MG tablet TAKE ONE TABLET BY MOUTH AT BEDTIME   spironolactone (ALDACTONE) 25 MG tablet TAKE 1 TABLET ONCE DAILY.   SYNTHROID 137 MCG tablet TAKE 1 TABLET ONCE DAILY BEFORE BREAKFAST.   No facility-administered encounter medications on file as of 02/16/2021.    Review of Systems:  Review of Systems  Constitutional:  Positive for activity change and unexpected weight change.  HENT:  Positive for congestion.   Respiratory:  Positive for cough and shortness of breath.   Cardiovascular:  Positive for leg swelling.  Gastrointestinal: Negative.   Genitourinary: Negative.   Musculoskeletal:  Positive for gait problem.  Skin: Negative.    Neurological:  Positive for weakness.  Psychiatric/Behavioral: Negative.     Health Maintenance  Topic Date Due   URINE MICROALBUMIN  Never done   FOOT EXAM  06/19/2020   OPHTHALMOLOGY EXAM  07/20/2020   INFLUENZA VACCINE  01/17/2021   HEMOGLOBIN A1C  05/27/2021   TETANUS/TDAP  11/13/2028   COVID-19 Vaccine  Completed   PNA vac Low Risk Adult  Completed   Zoster Vaccines- Shingrix  Completed   HPV VACCINES  Aged Out   DEXA SCAN  Discontinued    Physical Exam: Vitals:   02/16/21 1548  BP: 128/70  Pulse: 99  Temp: [name])  96.8 F (36 C)  SpO2: (!) 86%  Weight: 261 lb 6.4 oz (118.6 kg)  Height: $Remove'5\' 8"'jseuJCB$  (1.727 m)   Body mass index is 39.75 kg/m. Physical Exam Vitals reviewed.  Constitutional:      Appearance: She is obese.  HENT:     Head: Normocephalic.     Nose: Nose normal.     Mouth/Throat:     Mouth: Mucous membranes are moist.     Pharynx: Oropharynx is clear.  Eyes:     Pupils: Pupils are equal, round, and reactive to light.  Cardiovascular:     Rate and Rhythm: Normal rate and regular rhythm.     Pulses: Normal pulses.  Pulmonary:     Comments: Bilateral Rales and Wheezing Abdominal:     General: Abdomen is flat. Bowel sounds are normal.     Palpations: Abdomen is soft.  Musculoskeletal:        General: Swelling present.     Cervical back: Neck supple.  Skin:    General: Skin is warm.  Neurological:     General: No focal deficit present.     Mental Status: She is alert and oriented to person, place, and time.  Psychiatric:        Mood and Affect: Mood normal.        Thought Content: Thought content normal.    Labs reviewed: Basic Metabolic Panel: Recent Labs    04/01/20 0000 11/25/20 0740 01/01/21 0157 01/05/21 0406 01/06/21 0524 01/17/21 0737  NA 140 142   < > 140 140 136  K 4.1 4.1   < > 4.7 3.7 3.7  CL 101 101   < > 98 94* 95*  CO2 31 32   < > 33* 35* 33*  GLUCOSE 78 91   < > 115* 89 102*  BUN 13 23   < > 26* 17 14  CREATININE 0.98*  1.16*   < > 0.99 0.70 0.90  CALCIUM 8.5* 8.7   < > 9.1 8.9 8.9  MG 1.5  --   --   --   --   --   TSH  --  4.59*  --   --   --   --    < > = values in this interval not displayed.   Liver Function Tests: Recent Labs    04/01/20 0000 11/25/20 0740 01/06/21 0524  AST 13 12 12*  ALT $Re'8 7 11  'DDi$ ALKPHOS  --   --  69  BILITOT 0.6 0.4 0.7  PROT 6.1 6.2 6.1*  ALBUMIN  --   --  3.0*   No results for input(s): LIPASE, AMYLASE in the last 8760 hours. No results for input(s): AMMONIA in the last 8760 hours. CBC: Recent Labs    11/25/20 0740 01/01/21 0157 01/05/21 0406 01/06/21 0524 01/09/21 0600 01/17/21 0737  WBC 6.7 8.0 7.3 8.3 6.5 8.4  NEUTROABS 4,456 5.7 5.3  --   --   --   HGB 13.3 13.9 12.9 12.5 13.8 15.0  HCT 41.2 44.7 41.5 40.4 44.3 46.8*  MCV 87.7 89.8 91.6 91.6 89.3 89.1  PLT 116* 129* 118* 110* 140* PLATELET CLUMPS NOTED ON SMEAR, UNABLE TO ESTIMATE   Lipid Panel: Recent Labs    04/01/20 0000  CHOL 145  HDL 56  LDLCALC 71  TRIG 95  CHOLHDL 2.6   Lab Results  Component Value Date   HGBA1C 6.1 (H) 11/25/2020    Procedures since last visit: No results found.  Assessment/Plan Bronchitis Will start her on Prednisone 40 mg Tape rover few days She is listed allergic to it as it makes it feel sbad But has agreed to try it Duo Nebs Q6 for 3 days and then BID Chest Xray Addendum Was negative for any Acute process Chronic diastolic CHF (congestive heart failure) (HCC) Will Increase her Metolazone to 5 mg Qd for 1 week with Potasium 20 meq extra for 1 week Back to her old dose Allergic to Torsemide  Lung mass  Getting Radiation Therapy per Dr Lisbeth Renshaw   Other issue Essential hypertension On Aldactone   Hallucination, visual On low-dose of Seroquel Previous work-up has been negative Prediabetes A1c stable Hypothyroidism (acquired) TSH slightly elevated will follow in 3 months Depression with anxiety On Zoloft OSA (obstructive sleep apnea) Continue  CPAP Cognitive impairment Continue Namenda Her memory and MMSE in the past has been 30 out of 30. MRI showed generalized atrophy   Vitamin B12 deficiency Good levels Labs/tests ordered:  * No order type specified * Next appt:  04/20/2021

## 2021-02-18 ENCOUNTER — Ambulatory Visit
Admission: RE | Admit: 2021-02-18 | Discharge: 2021-02-18 | Disposition: A | Discharge status: Home / Self Care | Payer: Medicare Other | Source: Ambulatory Visit | Attending: Radiation Oncology | Admitting: Radiation Oncology

## 2021-02-18 ENCOUNTER — Emergency Department (HOSPITAL_COMMUNITY): Payer: Medicare Other

## 2021-02-18 ENCOUNTER — Emergency Department (HOSPITAL_COMMUNITY)
Admission: EM | Admit: 2021-02-18 | Discharge: 2021-02-18 | Disposition: A | Discharge status: Nursing Home (Includes ICF) | Payer: Medicare Other | Attending: Emergency Medicine | Admitting: Emergency Medicine

## 2021-02-18 ENCOUNTER — Encounter (HOSPITAL_COMMUNITY): Payer: Self-pay

## 2021-02-18 ENCOUNTER — Other Ambulatory Visit: Payer: Self-pay

## 2021-02-18 DIAGNOSIS — I110 Hypertensive heart disease with heart failure: Secondary | ICD-10-CM | POA: Insufficient documentation

## 2021-02-18 DIAGNOSIS — Z96653 Presence of artificial knee joint, bilateral: Secondary | ICD-10-CM | POA: Diagnosis not present

## 2021-02-18 DIAGNOSIS — Z87891 Personal history of nicotine dependence: Secondary | ICD-10-CM | POA: Diagnosis not present

## 2021-02-18 DIAGNOSIS — E039 Hypothyroidism, unspecified: Secondary | ICD-10-CM | POA: Insufficient documentation

## 2021-02-18 DIAGNOSIS — Z85118 Personal history of other malignant neoplasm of bronchus and lung: Secondary | ICD-10-CM | POA: Diagnosis not present

## 2021-02-18 DIAGNOSIS — I509 Heart failure, unspecified: Secondary | ICD-10-CM | POA: Diagnosis not present

## 2021-02-18 DIAGNOSIS — Z510 Encounter for antineoplastic radiation therapy: Secondary | ICD-10-CM | POA: Diagnosis not present

## 2021-02-18 DIAGNOSIS — R0602 Shortness of breath: Secondary | ICD-10-CM

## 2021-02-18 DIAGNOSIS — C3412 Malignant neoplasm of upper lobe, left bronchus or lung: Secondary | ICD-10-CM | POA: Insufficient documentation

## 2021-02-18 DIAGNOSIS — C3492 Malignant neoplasm of unspecified part of left bronchus or lung: Secondary | ICD-10-CM | POA: Diagnosis not present

## 2021-02-18 DIAGNOSIS — J9811 Atelectasis: Secondary | ICD-10-CM | POA: Diagnosis not present

## 2021-02-18 DIAGNOSIS — J9809 Other diseases of bronchus, not elsewhere classified: Secondary | ICD-10-CM | POA: Diagnosis not present

## 2021-02-18 DIAGNOSIS — Z9011 Acquired absence of right breast and nipple: Secondary | ICD-10-CM | POA: Insufficient documentation

## 2021-02-18 DIAGNOSIS — Z20822 Contact with and (suspected) exposure to covid-19: Secondary | ICD-10-CM | POA: Insufficient documentation

## 2021-02-18 DIAGNOSIS — R0789 Other chest pain: Secondary | ICD-10-CM | POA: Insufficient documentation

## 2021-02-18 DIAGNOSIS — M19011 Primary osteoarthritis, right shoulder: Secondary | ICD-10-CM | POA: Diagnosis not present

## 2021-02-18 LAB — BRAIN NATRIURETIC PEPTIDE: B Natriuretic Peptide: 93 pg/mL (ref 0.0–100.0)

## 2021-02-18 LAB — TROPONIN I (HIGH SENSITIVITY): Troponin I (High Sensitivity): 7 ng/L (ref <18)

## 2021-02-18 LAB — RESP PANEL BY RT-PCR (FLU A&B, COVID) ARPGX2
Influenza A by PCR: NEGATIVE
Influenza B by PCR: NEGATIVE
SARS Coronavirus 2 by RT PCR: NEGATIVE

## 2021-02-18 LAB — CBC
HCT: 40.2 % (ref 36.0–46.0)
Hemoglobin: 12.6 g/dL (ref 12.0–15.0)
MCH: 28.5 pg (ref 26.0–34.0)
MCHC: 31.3 g/dL (ref 30.0–36.0)
MCV: 91 fL (ref 80.0–100.0)
Platelets: 114 10*3/uL — ABNORMAL LOW (ref 150–400)
RBC: 4.42 MIL/uL (ref 3.87–5.11)
RDW: 18 % — ABNORMAL HIGH (ref 11.5–15.5)
WBC: 8.8 10*3/uL (ref 4.0–10.5)
nRBC: 0 % (ref 0.0–0.2)

## 2021-02-18 LAB — COMPREHENSIVE METABOLIC PANEL
ALT: 14 U/L (ref 0–44)
AST: 16 U/L (ref 15–41)
Albumin: 3.3 g/dL — ABNORMAL LOW (ref 3.5–5.0)
Alkaline Phosphatase: 67 U/L (ref 38–126)
Anion gap: 12 (ref 5–15)
BUN: 20 mg/dL (ref 8–23)
CO2: 32 mmol/L (ref 22–32)
Calcium: 8.7 mg/dL — ABNORMAL LOW (ref 8.9–10.3)
Chloride: 96 mmol/L — ABNORMAL LOW (ref 98–111)
Creatinine, Ser: 0.98 mg/dL (ref 0.44–1.00)
GFR, Estimated: 56 mL/min — ABNORMAL LOW (ref >60)
Glucose, Bld: 115 mg/dL — ABNORMAL HIGH (ref 70–99)
Potassium: 3.5 mmol/L (ref 3.5–5.1)
Sodium: 140 mmol/L (ref 135–145)
Total Bilirubin: 0.5 mg/dL (ref 0.3–1.2)
Total Protein: 6.4 g/dL — ABNORMAL LOW (ref 6.5–8.1)

## 2021-02-18 MED ORDER — ONDANSETRON HCL 4 MG/2ML IJ SOLN
4.0000 mg | Freq: Once | INTRAMUSCULAR | Status: AC
  Administered 2021-02-18: 4 mg via INTRAVENOUS
  Filled 2021-02-18: qty 2

## 2021-02-18 MED ORDER — IOHEXOL 350 MG/ML SOLN
80.0000 mL | Freq: Once | INTRAVENOUS | Status: AC | PRN
  Administered 2021-02-18: 80 mL via INTRAVENOUS

## 2021-02-18 MED ORDER — IPRATROPIUM-ALBUTEROL 0.5-2.5 (3) MG/3ML IN SOLN
3.0000 mL | Freq: Once | RESPIRATORY_TRACT | Status: AC
  Administered 2021-02-18: 3 mL via RESPIRATORY_TRACT
  Filled 2021-02-18: qty 3

## 2021-02-18 NOTE — ED Triage Notes (Addendum)
Patient BIB [facility] from [place] at [place] diagnosed 1 month ago with lung cancer, getting radiation. 6/10 completed. Home oxygen 4L baseline for CHF. Short of breath increased past few days, saturations have been fine. Occasional chest pressure that comes and goes. Patient presents with a cough.    EMS vitals 118/82 96% 4L [place] 100 HR

## 2021-02-18 NOTE — ED Provider Notes (Signed)
[facility] Emergency Department Provider Note MRN:  [id number]  Arrival date & time: 02/18/21     Chief Complaint   Shortness of Breath   History of Present Illness   Tamara Morales is a 87 y.o. year-old female with a history of lung cancer, CHF presenting to the ED with chief complaint of shortness of breath.  Location: Lungs Duration: Worsening over the past 2 days but much worse over the past few hours Onset: Gradual Timing: Constant Description: Trouble catching her breath Severity: Moderate Exacerbating/Alleviating Factors: None Associated Symptoms: Central chest pain, increased cough Pertinent Negatives: Denies fever, no abdominal pain, no other complaints  Additional History: Recent radiation treatments for lung cancer  Review of Systems  A complete 10 system review of systems was obtained and all systems are negative except as noted in the HPI and PMH.   Patient's Health History    Past Medical History:  Diagnosis Date   Altered mental status    ARTHRITIS, KNEES, BILATERAL    Breast CA ([place]) 2000   s/p R lumpectomy and XRT   CHF (congestive heart failure) (HCC)    DEPRESSION    DJD (degenerative joint disease) of knee    GERD    HYPERLIPIDEMIA    HYPERTENSION    HYPOTHYROIDISM    postsurgical   Hypoxia    MIGRAINE HEADACHE    Morbid obesity (HCC)    OSA (obstructive sleep apnea) 01/17/2011 dx   Oxygen dependent    Urge incontinence    UTI (lower urinary tract infection) 12/2014    Past Surgical History:  Procedure Laterality Date   ABDOMINAL HYSTERECTOMY     ADENOIDECTOMY     BREAST BIOPSY  2000   BREAST LUMPECTOMY  2000   CATARACT EXTRACTION     CHOLECYSTECTOMY  1995   CYSTOSCOPY/URETEROSCOPY/HOLMIUM LASER Right 06/28/2015   Procedure: CYSTOSCOPY RIGHT RETROGRAD RIGHT URETEROSCOPY/HOLMIUM LASER WITH RIGHT STENT PLACEMENT;  Surgeon: Alexis Frock, MD;  Location: [facility];  Service: Urology;  Laterality: Right;    PARATHYROIDECTOMY     2-3 removed   REPLACEMENT TOTAL KNEE BILATERAL  1999, 2005   Right leg femur repaired     THYROIDECTOMY     TONSILLECTOMY     TUBAL LIGATION      Family History  Problem Relation Age of Onset   Heart disease Mother    Heart disease Father    Emphysema Sister    Coronary artery disease Neg Hx     Social History   Socioeconomic History   Marital status: Married    Spouse name: Not on file   Number of children: 5   Years of education: some college   Highest education level: Not on file  Occupational History   Occupation: RETIRED    Employer: RETIRED    Comment: worked on office  Tobacco Use   Smoking status: Former    Packs/day: 0.25    Years: 2.00    Pack years: 0.50    Types: Cigarettes    Start date: 1954    Quit date: 06/19/1974    Years since quitting: 46.7   Smokeless tobacco: Never   Tobacco comments:    Quit in late 20's  Vaping Use   Vaping Use: Never used  Substance and Sexual Activity   Alcohol use: No    Alcohol/week: 0.0 standard drinks   Drug use: No   Sexual activity: Never  Other Topics Concern   Not on file  Social History Narrative  Married to husband that has dementia (he lives in the skilled nursing wing) and this is a big stressor. 5 children. 10 grandchildren. 2 greatgrandchildren. All children [place]      Lives at [facility], in independent living.      Retired from multiple different Community education officer, university, Psychologist, educational.       Hobbies: Management consultant, write poetry, craft dolls      Right-handed.      No daily caffeine use.   Social Determinants of Health   Financial Resource Strain: Not on file  Food Insecurity: Not on file  Transportation Needs: Not on file  Physical Activity: Not on file  Stress: Not on file  Social Connections: Not on file  Intimate Partner Violence: Not on file     Physical Exam   Vitals:   02/18/21 0530 02/18/21 0630  BP: (!) 145/74 (!) 142/90  Pulse: 95 92  Resp: (!)  24 20  Temp:    SpO2: 98% 99%    CONSTITUTIONAL: Chronically ill-appearing, NAD NEURO:  Alert and oriented x 3, no focal deficits EYES:  eyes equal and reactive ENT/NECK:  no LAD, no JVD CARDIO: Regular rate, well-perfused, normal S1 and S2 PULM:  CTAB no wheezing or rhonchi GI/GU:  normal bowel sounds, non-distended, non-tender MSK/SPINE:  No gross deformities, no edema SKIN:  no rash, atraumatic PSYCH:  Appropriate speech and behavior  *Additional and/or pertinent findings included in MDM below  Diagnostic and Interventional Summary    EKG Interpretation  Date/Time:  Friday February 18 2021 05:47:00 EDT Ventricular Rate:  98 PR Interval:  166 QRS Duration: 97 QT Interval:  336 QTC Calculation: 429 R Axis:   72 Text Interpretation: Sinus rhythm Probable left atrial enlargement RSR' in V1 or V2, right VCD or RVH Confirmed by Gerlene Fee [phone]) on 02/18/2021 6:43:43 AM       Labs Reviewed  CBC - Abnormal; Notable for the following components:      Result Value   RDW 18.0 (*)    Platelets 114 (*)    All other components within normal limits  COMPREHENSIVE METABOLIC PANEL - Abnormal; Notable for the following components:   Chloride 96 (*)    Glucose, Bld 115 (*)    Calcium 8.7 (*)    Total Protein 6.4 (*)    Albumin 3.3 (*)    GFR, Estimated 56 (*)    All other components within normal limits  RESP PANEL BY RT-PCR (FLU A&B, COVID) ARPGX2  BRAIN NATRIURETIC PEPTIDE  TROPONIN I (HIGH SENSITIVITY)  TROPONIN I (HIGH SENSITIVITY)    CT Angio Chest Pulmonary Embolism (PE) W or WO Contrast    (Results Pending)    Medications  ondansetron (ZOFRAN) injection 4 mg (4 mg Intravenous Given 02/18/21 0542)     Procedures  /  Critical Care Procedures  ED Course and Medical Decision Making  I have reviewed the triage vital signs, the nursing notes, and pertinent available records from the EMR.  Listed above are laboratory and imaging tests that I personally ordered,  reviewed, and interpreted and then considered in my medical decision making (see below for details).  Considering ACS versus PE versus pneumonia versus CHF versus symptoms related to recent radiation treatment.  Awaiting labs, CTA.     Still awaiting labs and CTA.  Patient uses 3 L oxygen at home at baseline, currently on 4 L but satting 99%.  We will try to titrate down to normal.  If we can get  her back to her normal oxygen saturation and all of her testing is completely reassuring, patient would be a candidate for discharge.  Signed out to oncoming provider at shift change.  Barth Kirks. Sedonia Small, [place] [email or web address]  Final Clinical Impressions(s) / ED Diagnoses     ICD-10-CM   1. SOB (shortness of breath)  R06.02       ED Discharge Orders     None        Discharge Instructions Discussed with and Provided to Patient:   Discharge Instructions   None       Maudie Flakes, MD 02/18/21 [phone]

## 2021-02-18 NOTE — ED Provider Notes (Signed)
Patient signed out to me at 7 AM.  History of lung cancer.  Has had cough and shortness of breath.  Had an outpatient chest x-ray that showed possibly some fluid on the lungs.  She is on her baseline oxygen.  No signs of respiratory distress.  Lab work thus far unremarkable.  Normal troponin.  Normal BNP.  No significant anemia, electrolyte abnormality, kidney injury otherwise.  COVID and viral test negative.  Awaiting CT scan of Chest.  CT scan shows no evidence of PE.  There is no evidence of pneumonia.  There is no evidence of pleural effusion or volume overload.  Overall the left lung cancer now appears to have some decreased size when compared to CT scan from a month ago.  Otherwise there are no new changes on CT scan.  Overall patient given reassurance and discharged from the ED in good condition.  This chart was dictated using voice recognition software.  Despite best efforts to proofread,  errors can occur which can change the documentation meaning.    Lennice Sites, DO 02/18/21 [phone]

## 2021-02-19 LAB — TSH: TSH: 2.21 (ref 0.41–5.90)

## 2021-02-22 ENCOUNTER — Other Ambulatory Visit: Payer: Self-pay

## 2021-02-22 ENCOUNTER — Ambulatory Visit
Admission: RE | Admit: 2021-02-22 | Discharge: 2021-02-22 | Disposition: A | Discharge status: Home / Self Care | Payer: Medicare Other | Source: Ambulatory Visit | Attending: Radiation Oncology | Admitting: Radiation Oncology

## 2021-02-22 DIAGNOSIS — Z510 Encounter for antineoplastic radiation therapy: Secondary | ICD-10-CM | POA: Diagnosis not present

## 2021-02-22 DIAGNOSIS — C3412 Malignant neoplasm of upper lobe, left bronchus or lung: Secondary | ICD-10-CM | POA: Diagnosis not present

## 2021-02-23 ENCOUNTER — Ambulatory Visit
Admission: RE | Admit: 2021-02-23 | Discharge: 2021-02-23 | Disposition: A | Discharge status: Home / Self Care | Payer: Medicare Other | Source: Ambulatory Visit | Attending: Radiation Oncology | Admitting: Radiation Oncology

## 2021-02-23 DIAGNOSIS — Z510 Encounter for antineoplastic radiation therapy: Secondary | ICD-10-CM | POA: Diagnosis not present

## 2021-02-23 DIAGNOSIS — C3412 Malignant neoplasm of upper lobe, left bronchus or lung: Secondary | ICD-10-CM | POA: Diagnosis not present

## 2021-02-24 ENCOUNTER — Encounter: Payer: Self-pay | Admitting: Radiation Oncology

## 2021-02-24 ENCOUNTER — Ambulatory Visit
Admission: RE | Admit: 2021-02-24 | Discharge: 2021-02-24 | Disposition: A | Discharge status: Home / Self Care | Payer: Medicare Other | Source: Ambulatory Visit | Attending: Radiation Oncology | Admitting: Radiation Oncology

## 2021-02-24 DIAGNOSIS — C3412 Malignant neoplasm of upper lobe, left bronchus or lung: Secondary | ICD-10-CM | POA: Diagnosis not present

## 2021-02-24 DIAGNOSIS — Z510 Encounter for antineoplastic radiation therapy: Secondary | ICD-10-CM | POA: Diagnosis not present

## 2021-02-28 ENCOUNTER — Telehealth: Payer: Self-pay | Admitting: Radiation Oncology

## 2021-02-28 NOTE — Telephone Encounter (Signed)
I called the patient's daughter [name] who was part of our initial consultation. She stated her mother was feeling better and had a new attitude on life, hopeful that with the radiation she may have better quality and possibly slightly longer longevity thanks to the radiation. She is interested in another CT scan and possibly more radiation. I let [name] know that I would call her back once I had a chance to speak with Dr. Lisbeth Renshaw.

## 2021-02-28 NOTE — Progress Notes (Signed)
                                                                                                                                                             Patient Name: Tamara Morales MRN: [id number] DOB: 01-31-1931 Referring Physician: Baltazar Apo (Profile Not Attached) Date of Service: 02/24/2021 [facility] [place], [place]                                                        End Of Treatment Note  Diagnoses: C34.12-Malignant neoplasm of upper lobe, left bronchus or lung  Cancer Staging: Putative Stage III, NSCLC of the LUL  Intent: Palliative  Radiation Treatment Dates: 02/09/2021 through 02/24/2021 Site Technique Total Dose (Gy) Dose per Fx (Gy) Completed Fx Beam Energies  Lung, Left: Lung_Lt 3D 30/30 3 10/10 6X, 10X   Narrative: The patient tolerated radiation therapy relatively well. She did develop some additional shortness of breath and was worked up and was negative for PE or pneumonia or covid.   Plan: The patient will receive a follow up call from the radiation oncology department.   ________________________________________________    Carola Rhine, PAC

## 2021-03-03 ENCOUNTER — Non-Acute Institutional Stay (SKILLED_NURSING_FACILITY): Payer: Medicare Other | Admitting: Orthopedic Surgery

## 2021-03-03 ENCOUNTER — Encounter: Payer: Self-pay | Admitting: Orthopedic Surgery

## 2021-03-03 DIAGNOSIS — R441 Visual hallucinations: Secondary | ICD-10-CM | POA: Diagnosis not present

## 2021-03-03 DIAGNOSIS — I10 Essential (primary) hypertension: Secondary | ICD-10-CM | POA: Diagnosis not present

## 2021-03-03 DIAGNOSIS — F418 Other specified anxiety disorders: Secondary | ICD-10-CM | POA: Diagnosis not present

## 2021-03-03 DIAGNOSIS — E890 Postprocedural hypothyroidism: Secondary | ICD-10-CM | POA: Diagnosis not present

## 2021-03-03 DIAGNOSIS — F0151 Vascular dementia with behavioral disturbance: Secondary | ICD-10-CM

## 2021-03-03 DIAGNOSIS — I5032 Chronic diastolic (congestive) heart failure: Secondary | ICD-10-CM

## 2021-03-03 DIAGNOSIS — K219 Gastro-esophageal reflux disease without esophagitis: Secondary | ICD-10-CM

## 2021-03-03 DIAGNOSIS — G4733 Obstructive sleep apnea (adult) (pediatric): Secondary | ICD-10-CM | POA: Diagnosis not present

## 2021-03-03 DIAGNOSIS — R7303 Prediabetes: Secondary | ICD-10-CM | POA: Diagnosis not present

## 2021-03-03 DIAGNOSIS — F01518 Vascular dementia, unspecified severity, with other behavioral disturbance: Secondary | ICD-10-CM

## 2021-03-03 DIAGNOSIS — R918 Other nonspecific abnormal finding of lung field: Secondary | ICD-10-CM

## 2021-03-03 NOTE — Progress Notes (Signed)
Location:  [facility] Room Number: [phone] Place of Service:  SNF [phone]) Provider:  Windell Moulding, AGNP-C  Virgie Dad, MD  Patient Care Team: Virgie Dad, MD as PCP - General (Internal Medicine) Minus Breeding, MD as PCP - Cardiology (Cardiology) Azucena Fallen, MD as Consulting Physician (Obstetrics and Gynecology) Chesley Mires, MD as Consulting Physician (Pulmonary Disease) Mast, Man X, NP as Nurse Practitioner (Internal Medicine) Christin Fudge, MD (Inactive) as Consulting Physician (Surgery)  Extended Emergency Contact Information Primary Emergency Contact: Boxman,Lee Address: [address]          [place], [place] [address] Johnnette Litter of [place] Phone: [phone] Relation: Daughter Secondary Emergency Contact: Ritta Slot [place] of [place] Mobile Phone: [phone] Relation: Daughter  Code Status:  DNR Goals of care: Advanced Directive information Advanced Directives 02/18/2021  Does Patient Have a Medical Advance Directive? Yes  Type of Paramedic of [place];Living will;Out of facility DNR (pink MOST or yellow form)  Does patient want to make changes to medical advance directive? No - Patient declined  Copy of [place] in Chart? -  Would patient like information on creating a medical advance directive? -  Pre-existing out of facility DNR order (yellow form or pink MOST form) -     Chief Complaint  Patient presents with   Medical Management of Chronic Issues    HPI:  Pt is a 87 y.o. female seen today for medical management of chronic diseases.    She currently resides on the skilled nursing unit at [facility]. Past medical history includes: diastolic CHF, HTN, OSA, chronic respiratory failure, lung cancer, constipation, GERD, IBS, T2DM, hypothyroidism, vascular dementia, CKD 3, HLD, depression and anxiety.   Lung Mass- She was hospitalized 07/20-07/25 due to left upper  lobe collapse due to malignancy. She is currently followed by Dr. Lisbeth Renshaw and receiving palliative radiation. 08/30 she c/o increased sob after receiving radiation the same day. She was started on prednisone taper, duonebs, CXR negative for acute processes, metolazone also increased to 5 mg x 1 week. 09/02 she presented to [facility] ED with progressive sob. No sign of respiratory distress, labs including BNP/troponin unremarkable. Covid negative. CT chest negative for PE and pneumonia, left lung cancer had improved from previous image a month ago. She was discharged back to SNF. 09/08 she received her final treatment of radiation, 10 total. Today, she denies sob. She uses oxygen 3-4 liters prn. Admits to mild discomfort to left upper chest when taking a deep breath, improved after taking tylenol.  CHF- no recent weight fluctuations or increased ankle edema, BNP 93 02/18/2021, remains on metolazone due to torsemide allergy and spirolactone.  Prediabetes- a1c 6.1 11/2020, no medication at this time, carb modified diet HTN- BUN/creat 20/0.98 02/18/2021, remains on diuretics for CHF OSA- continues to use CPAP qhs Dementia- MMSE 30/30 in past, CT head indicates moderate chronic small vessel disease, no recent behavioral outbursts, remains on namenda  Hallucinations- remains on Seroquel,  Hypothyroidism- TSH 4.59 11/2020, synthroid daily GERD-  hgb 12.6 09/02, no increased reflux, prilosec daily Depression/anxiety- remains on zoloft  No recent falls or injuries. Ambulates with PWC.   Recent blood pressures:  09/14- 121/65  09/07- 115/60  09/01- 150/94  Recent weights:  09/01- 254.6 lbs  08/02- 254.6 lbs   HR elevated today, recent trends:  09/14- 105  09/12- 90  09/07- 97  Nurse does not report any concerns, vitals stable.  Past Medical History:  Diagnosis Date   Altered mental status    ARTHRITIS, KNEES, BILATERAL    Breast CA ([place]) 2000   s/p R lumpectomy and XRT   CHF (congestive  heart failure) (HCC)    DEPRESSION    DJD (degenerative joint disease) of knee    GERD    HYPERLIPIDEMIA    HYPERTENSION    HYPOTHYROIDISM    postsurgical   Hypoxia    MIGRAINE HEADACHE    Morbid obesity (HCC)    OSA (obstructive sleep apnea) 01/17/2011 dx   Oxygen dependent    Urge incontinence    UTI (lower urinary tract infection) 12/2014   Past Surgical History:  Procedure Laterality Date   ABDOMINAL HYSTERECTOMY     ADENOIDECTOMY     BREAST BIOPSY  2000   BREAST LUMPECTOMY  2000   CATARACT EXTRACTION     CHOLECYSTECTOMY  1995   CYSTOSCOPY/URETEROSCOPY/HOLMIUM LASER Right 06/28/2015   Procedure: CYSTOSCOPY RIGHT RETROGRAD RIGHT URETEROSCOPY/HOLMIUM LASER WITH RIGHT STENT PLACEMENT;  Surgeon: Alexis Frock, MD;  Location: [facility];  Service: Urology;  Laterality: Right;   PARATHYROIDECTOMY     2-3 removed   REPLACEMENT TOTAL KNEE BILATERAL  1999, 2005   Right leg femur repaired     THYROIDECTOMY     TONSILLECTOMY     TUBAL LIGATION      Allergies  Allergen Reactions   Atorvastatin Nausea Only   Azithromycin Itching   Beta Adrenergic Blockers Other (See Comments)    Depression    Ciprofloxacin Other (See Comments)    Edgy and very jumpy     Codeine Nausea And Vomiting   Darvon [Propoxyphene] Nausea And Vomiting   Epinephrine Other (See Comments)    Rapid pulse, sweats   Klonopin [Clonazepam] Other (See Comments)    Makes her feel very suicidal    Oxycodone Other (See Comments)    Patient felt like it altered her mental status "Crazy" . Hallucinations later as well.   Paxil [Paroxetine Hydrochloride] Other (See Comments)    Severe depression    Prednisone Other (See Comments)    "makes me feel really bad"   Propoxyphene Hcl Nausea And Vomiting   Tape Itching   Torsemide Other (See Comments)    Patient stated that she doesn't recall the reaction   Tramadol Other (See Comments)    Feels "jittery"   Vicodin [Hydrocodone-Acetaminophen] Other (See Comments)     Hallucinations   Meloxicam Diarrhea   Other Rash    Polyester sheets "cause me a rash"   Vancomycin Rash    Localized rash related to infusion rate    Outpatient Encounter Medications as of 03/03/2021  Medication Sig   aspirin 81 MG tablet Take 81 mg by mouth daily.   CITRACAL PETITES/VITAMIN D 200-250 MG-UNIT TABS Take 1 tablet by mouth every morning.   cyanocobalamin 2000 MCG tablet Take 2,000 mcg by mouth daily.   FIBER PO Take 1 tablet by mouth 2 (two) times daily.   ipratropium-albuterol (DUONEB) 0.5-2.5 (3) MG/3ML SOLN Take 3 mLs by nebulization daily.   Melatonin 5 MG TABS Take 1 tablet (5 mg total) by mouth at bedtime.   memantine (NAMENDA) 10 MG tablet TAKE 1 TABLET BY MOUTH TWICE DAILY.   metolazone (ZAROXOLYN) 2.5 MG tablet TAKE 1 TABLET FIVE DAYS A WEEK, TAKE 2 TABLETS ON MONDAY AND FRIDAY   nystatin (MYCOSTATIN/NYSTOP) powder Apply 1 application topically as needed.   omeprazole (PRILOSEC) 20 MG capsule TAKE 1 CAPSULE TWICE  DAILY BEFORE MEALS   OXYGEN Inhale 4 L into the lungs continuous. KEEP O2 SATS >= 90%. Every Shift   potassium chloride (MICRO-K) 10 MEQ CR capsule TAKE 4 CAPSULES DAILY.   QUEtiapine (SEROQUEL) 25 MG tablet TAKE 1/2 TABLET AT BEDTIME.   saccharomyces boulardii (FLORASTOR) 250 MG capsule Take 1 capsule (250 mg total) by mouth daily.   sertraline (ZOLOFT) 100 MG tablet TAKE ONE TABLET BY MOUTH AT BEDTIME   spironolactone (ALDACTONE) 25 MG tablet TAKE 1 TABLET ONCE DAILY.   SYNTHROID 137 MCG tablet TAKE 1 TABLET ONCE DAILY BEFORE BREAKFAST.   No facility-administered encounter medications on file as of 03/03/2021.    Review of Systems  Constitutional:  Negative for activity change, appetite change, fatigue and fever.  HENT:  Negative for dental problem, hearing loss and trouble swallowing.   Eyes:  Negative for visual disturbance.  Respiratory:  Positive for shortness of breath. Negative for cough and wheezing.        Oxygen use  Cardiovascular:   Positive for leg swelling. Negative for chest pain.  Gastrointestinal:  Negative for abdominal distention, abdominal pain, blood in stool, constipation, diarrhea and nausea.  Endocrine: Negative for polydipsia, polyphagia and polyuria.  Genitourinary:  Negative for dysuria, frequency and hematuria.  Musculoskeletal:  Positive for arthralgias and gait problem.       Left shoulder pain  Skin: Negative.   Neurological:  Positive for weakness. Negative for dizziness, light-headedness and headaches.  Psychiatric/Behavioral:  Positive for dysphoric mood. Negative for confusion and sleep disturbance. The patient is nervous/anxious.    Immunization History  Administered Date(s) Administered   Influenza Split 03/20/2011   Influenza Whole 03/19/2009, 03/19/2012   Influenza, High Dose Seasonal PF 03/16/2018, 04/20/2019, 03/31/2020   Influenza,inj,Quad PF,6+ Mos 03/19/2013   Influenza-Unspecified 03/03/2014, 03/15/2015, 03/30/2016, 04/05/2017   Meningococcal Polysaccharide 03/15/2015   Moderna Sars-Covid-2 Vaccination 06/22/2018, 07/21/2019, 04/27/2020, 11/16/2020   PPD Test 03/30/2016   Pneumococcal Conjugate-13 03/12/2014   Pneumococcal Polysaccharide-23 06/19/2006, 03/15/2015   Td 10/04/2009   Tdap 06/19/2005, 11/14/2018   Zoster Recombinat (Shingrix) 01/28/2018, 04/22/2018, 05/22/2018   Zoster, Live 06/19/2010   Pertinent  Health Maintenance Due  Topic Date Due   URINE MICROALBUMIN  Never done   FOOT EXAM  06/19/2020   OPHTHALMOLOGY EXAM  07/20/2020   INFLUENZA VACCINE  01/17/2021   HEMOGLOBIN A1C  05/27/2021   PNA vac Low Risk Adult  Completed   DEXA SCAN  Discontinued   Fall Risk  02/16/2021 08/06/2020 08/05/2020 05/06/2020 02/06/2020  Falls in the past year? 0 0 0 0 0  Number falls in past yr: 0 0 0 0 0  Injury with Fall? - - 0 - -  Risk for fall due to : - - - - -  Follow up Falls evaluation completed - - - -   Functional Status Survey:    Vitals:   03/03/21 1608  BP: 121/65   Pulse: (!) 105  Resp: 18  Temp: 98.3 F (36.8 C)  SpO2: 94%  Weight: 254 lb 9.6 oz (115.5 kg)  Height: $Remove'5\' 8"'LmbHmpP$  (1.727 m)   Body mass index is 38.71 kg/m. Physical Exam Vitals reviewed.  Constitutional:      General: She is not in acute distress.    Appearance: She is obese.  HENT:     Head: Normocephalic.     Right Ear: There is no impacted cerumen.     Left Ear: There is no impacted cerumen.     Nose: Nose normal.  Mouth/Throat:     Mouth: Mucous membranes are moist.  Eyes:     General:        Right eye: No discharge.        Left eye: No discharge.     Pupils: Pupils are equal, round, and reactive to light.  Neck:     Vascular: No carotid bruit.  Cardiovascular:     Rate and Rhythm: Normal rate and regular rhythm.     Pulses: Normal pulses.     Heart sounds: Normal heart sounds. No murmur heard. Pulmonary:     Effort: Pulmonary effort is normal. No respiratory distress.     Breath sounds: Normal breath sounds. No wheezing.     Comments: Room air during exam Abdominal:     General: There is no distension.     Palpations: Abdomen is soft.     Tenderness: There is no abdominal tenderness.  Musculoskeletal:     Cervical back: Normal range of motion.     Right lower leg: Edema present.     Left lower leg: Edema present.     Comments: +1 edema  Lymphadenopathy:     Cervical: No cervical adenopathy.  Skin:    General: Skin is warm and dry.     Capillary Refill: Capillary refill takes less than 2 seconds.  Neurological:     General: No focal deficit present.     Mental Status: She is alert and oriented to person, place, and time.     Motor: Weakness present.     Gait: Gait abnormal.     Comments: PWC  Psychiatric:        Mood and Affect: Mood normal.        Behavior: Behavior normal.        Cognition and Memory: Memory is impaired.     Comments: Alert and oriented x 4, very pleasant    Labs reviewed: Recent Labs    04/01/20 0000 11/25/20 0740  01/06/21 0524 01/17/21 0737 02/18/21 0539  NA 140   < > 140 136 140  K 4.1   < > 3.7 3.7 3.5  CL 101   < > 94* 95* 96*  CO2 31   < > 35* 33* 32  GLUCOSE 78   < > 89 102* 115*  BUN 13   < > $R'17 14 20  'bN$ CREATININE 0.98*   < > 0.70 0.90 0.98  CALCIUM 8.5*   < > 8.9 8.9 8.7*  MG 1.5  --   --   --   --    < > = values in this interval not displayed.   Recent Labs    11/25/20 0740 01/06/21 0524 02/18/21 0539  AST 12 12* 16  ALT $Re'7 11 14  'SIa$ ALKPHOS  --  69 67  BILITOT 0.4 0.7 0.5  PROT 6.2 6.1* 6.4*  ALBUMIN  --  3.0* 3.3*   Recent Labs    11/25/20 0740 01/01/21 0157 01/05/21 0406 01/06/21 0524 01/09/21 0600 01/17/21 0737 02/18/21 0539  WBC 6.7 8.0 7.3   < > 6.5 8.4 8.8  NEUTROABS 4,456 5.7 5.3  --   --   --   --   HGB 13.3 13.9 12.9   < > 13.8 15.0 12.6  HCT 41.2 44.7 41.5   < > 44.3 46.8* 40.2  MCV 87.7 89.8 91.6   < > 89.3 89.1 91.0  PLT 116* 129* 118*   < > 140* PLATELET CLUMPS NOTED ON SMEAR, UNABLE TO  ESTIMATE 114*   < > = values in this interval not displayed.   Lab Results  Component Value Date   TSH 4.59 (H) 11/25/2020   Lab Results  Component Value Date   HGBA1C 6.1 (H) 11/25/2020   Lab Results  Component Value Date   CHOL 145 04/01/2020   HDL 56 04/01/2020   LDLCALC 71 04/01/2020   LDLDIRECT 99.4 03/13/2012   TRIG 95 04/01/2020   CHOLHDL 2.6 04/01/2020    Significant Diagnostic Results in last 30 days:  CT Angio Chest Pulmonary Embolism (PE) W or WO Contrast  Result Date: 02/18/2021 CLINICAL DATA:  High probability for pulmonary embolism. Lung cancer. EXAM: CT ANGIOGRAPHY CHEST WITH CONTRAST TECHNIQUE: Multidetector CT imaging of the chest was performed using the standard protocol during bolus administration of intravenous contrast. Multiplanar CT image reconstructions and MIPs were obtained to evaluate the vascular anatomy. CONTRAST:  71mL OMNIPAQUE IOHEXOL 350 MG/ML SOLN COMPARISON:  01/17/2021 FINDINGS: Cardiovascular: Satisfactory opacification of  the pulmonary arteries to the segmental level. No evidence of pulmonary embolism when accounting for motion artifact especially affecting the bases. Normal heart size. No pericardial effusion. Atherosclerotic plaque involving the aorta. Mediastinum/Nodes: Amorphous soft tissue at the left hilum has diminished, likely both primary tumor and adenopathy. Left paratracheal node is also less thick than before. Postoperative right axilla and breast. Lungs/Pleura: Left suprahilar mass with left upper lobe collapse. The bulk of soft tissue density/tumor has diminished. Where measured on comparison at the left suprahilar region thickness has diminished from 19 mm to 15 mm. Elsewhere there is bronchomalacia with intermittent airway collapse. Patchy air trapping at the apices. No edema, pneumonia, or significant pleural fluid Upper Abdomen: Ill-defined mass at the anterior spleen with margins difficult to visualize partly due to contrast timing. The mass is long-standing based on prior imaging reports. Musculoskeletal: Generalized degenerative disease and scoliosis. Multilevel bridging thoracic osteophytes. Severe glenohumeral osteoarthritis on the right. Review of the MIP images confirms the above findings. IMPRESSION: 1. No evidence of pulmonary embolism.  Intermittent motion artifact. 2. Left lung cancer with decreasing soft tissue mass when compared to CT 1 month ago. The left upper lobe remains collapsed/obstructed. Electronically Signed   By: Monte Fantasia M.D.   On: 02/18/2021 08:05    Assessment/Plan 1. Lung mass - Left upper lung- CT chest 09/02 confirmed decrease in soft tissue mass - sob 09/02- prednisone complete 09/25 - followed by Dr. Lisbeth Renshaw - completed 10 treatments of radiation, last treatment 09/08 - cont oxygen prn  2. Chronic diastolic CHF (congestive heart failure) (HCC) - BNP 93 02/18/2021 - no recent weight fluctuations, +1 edema - cont metolazone/ allergic to torsemide - cont  spironolactone - cont NAS diet  3. Prediabetes - a1c 6.1 11/2020 - cont low carb diet  4. Essential hypertension - controlled, remains on diuretics  5. OSA (obstructive sleep apnea) - stable with CPAP  6. Vascular dementia with behavior disturbance ([place]) - high functioning, very pleasant - cont namenda  7. Hallucination, visual - stable with Seroquel  8. Postoperative Hypothyroidism  - TSH 4.59 11/2020 - cont synthroid  9. Gastroesophageal reflux disease without esophagitis - no increased reflux - cont prilosec  10. Depression with anxiety - stable with zoloft    Family/ staff Communication: plan discussed with patient and nurse  Labs/tests ordered:  none

## 2021-03-07 ENCOUNTER — Telehealth: Payer: Self-pay | Admitting: Radiation Oncology

## 2021-03-07 NOTE — Telephone Encounter (Signed)
I called the patient and her daughter [name] to discuss my conversation with Dr. Lisbeth Renshaw. He would not anticipate more radiation at this time, but is in agreement with following the patient if she desires. He does not recommend setting an expectation for routine scans for surveillance, but for palliative care referral, a check in phone call in a few months, and if symptoms of SOB or hemoptysis develop consideration of another scan if she were to proceed with treatment. She is in agreement and we discussed possible hospice referral as well. She would like me to speak with Dr. Lyndel Safe at [facility] through [facility] as well so we can coordinate her plans of long term care. She is under the impression that Dr. Steve Rattler team can deliver hospice type care as well without losing her as her primary provider.

## 2021-03-10 ENCOUNTER — Ambulatory Visit: Payer: Medicare Other | Admitting: Physician Assistant

## 2021-03-11 DIAGNOSIS — I509 Heart failure, unspecified: Secondary | ICD-10-CM | POA: Diagnosis not present

## 2021-03-11 DIAGNOSIS — R1314 Dysphagia, pharyngoesophageal phase: Secondary | ICD-10-CM | POA: Diagnosis not present

## 2021-03-11 DIAGNOSIS — J9809 Other diseases of bronchus, not elsewhere classified: Secondary | ICD-10-CM | POA: Diagnosis not present

## 2021-03-11 DIAGNOSIS — R1310 Dysphagia, unspecified: Secondary | ICD-10-CM | POA: Diagnosis not present

## 2021-03-11 DIAGNOSIS — R2681 Unsteadiness on feet: Secondary | ICD-10-CM | POA: Diagnosis not present

## 2021-03-11 DIAGNOSIS — M6281 Muscle weakness (generalized): Secondary | ICD-10-CM | POA: Diagnosis not present

## 2021-03-11 DIAGNOSIS — R278 Other lack of coordination: Secondary | ICD-10-CM | POA: Diagnosis not present

## 2021-03-11 DIAGNOSIS — R29898 Other symptoms and signs involving the musculoskeletal system: Secondary | ICD-10-CM | POA: Diagnosis not present

## 2021-03-11 DIAGNOSIS — I110 Hypertensive heart disease with heart failure: Secondary | ICD-10-CM | POA: Diagnosis not present

## 2021-03-11 DIAGNOSIS — R531 Weakness: Secondary | ICD-10-CM | POA: Diagnosis not present

## 2021-03-14 ENCOUNTER — Non-Acute Institutional Stay (SKILLED_NURSING_FACILITY): Payer: Medicare Other | Admitting: Internal Medicine

## 2021-03-14 ENCOUNTER — Encounter: Payer: Self-pay | Admitting: Internal Medicine

## 2021-03-14 DIAGNOSIS — I5032 Chronic diastolic (congestive) heart failure: Secondary | ICD-10-CM

## 2021-03-14 DIAGNOSIS — R918 Other nonspecific abnormal finding of lung field: Secondary | ICD-10-CM | POA: Diagnosis not present

## 2021-03-14 DIAGNOSIS — J418 Mixed simple and mucopurulent chronic bronchitis: Secondary | ICD-10-CM

## 2021-03-14 LAB — T3 UPTAKE
Free T4: 3.8
T3 Uptake: 36
Thyroxine (T4): 10.5

## 2021-03-14 NOTE — Progress Notes (Signed)
Location:   [facility] Room Number: [id number] Place of Service:  SNF [phone]) Provider:  Veleta Miners MD  Virgie Dad, MD  Patient Care Team: Virgie Dad, MD as PCP - General (Internal Medicine) Minus Breeding, MD as PCP - Cardiology (Cardiology) Azucena Fallen, MD as Consulting Physician (Obstetrics and Gynecology) Chesley Mires, MD as Consulting Physician (Pulmonary Disease) Mast, Man X, NP as Nurse Practitioner (Internal Medicine) Christin Fudge, MD (Inactive) as Consulting Physician (Surgery)  Extended Emergency Contact Information Primary Emergency Contact: Boxman,Lee Address: [address]          [place], [place] [address] Johnnette Litter of [place] Phone: [phone] Relation: Daughter Secondary Emergency Contact: Ritta Slot [place] of [place] Mobile Phone: [phone] Relation: Daughter  Code Status:  DNR Goals of care: Advanced Directive information Advanced Directives 03/14/2021  Does Patient Have a Medical Advance Directive? Yes  Type of Paramedic of [place];Living will;Out of facility DNR (pink MOST or yellow form)  Does patient want to make changes to medical advance directive? No - Patient declined  Copy of [place] in Chart? Yes - validated most recent copy scanned in chart (See row information)  Would patient like information on creating a medical advance directive? -  Pre-existing out of facility DNR order (yellow form or pink MOST form) Yellow form placed in chart (order not valid for inpatient use);Pink MOST form placed in chart (order not valid for inpatient use)     Chief Complaint  Patient presents with   Acute Visit    SOB    HPI:  Pt is a 87 y.o. female seen today for an acute visit for SOB  Patient has h/o Hypertension,  Chronic LE edema with Diastolic CHF and Lymphedema, Anxiety, B12 def, OSA but does not use CPAP only Oxygen , Diabetes Type 2, Diarrhea  chronic,Work up with GI negative Visual Hallucinations Work up with Neurology Possible Neurodegenerative disease Admitted from 7/20-7/25 for Left Upper lobe Collapse due to Malignancy S/P  Radiation Therapy   C/o SOB and Cough Feeling tight in chest. No Fever or Chest pain Cough is not productive Nebs are helping Has lot some weight  She says she does not feel good  Past Medical History:  Diagnosis Date   Altered mental status    ARTHRITIS, KNEES, BILATERAL    Breast CA ([place]) 2000   s/p R lumpectomy and XRT   CHF (congestive heart failure) ([place])    DEPRESSION    DJD (degenerative joint disease) of knee    GERD    HYPERLIPIDEMIA    HYPERTENSION    HYPOTHYROIDISM    postsurgical   Hypoxia    MIGRAINE HEADACHE    Morbid obesity ([place])    OSA (obstructive sleep apnea) 01/17/2011 dx   Oxygen dependent    Urge incontinence    UTI (lower urinary tract infection) 12/2014   Past Surgical History:  Procedure Laterality Date   ABDOMINAL HYSTERECTOMY     ADENOIDECTOMY     BREAST BIOPSY  2000   BREAST LUMPECTOMY  2000   CATARACT EXTRACTION     CHOLECYSTECTOMY  1995   CYSTOSCOPY/URETEROSCOPY/HOLMIUM LASER Right 06/28/2015   Procedure: CYSTOSCOPY RIGHT RETROGRAD RIGHT URETEROSCOPY/HOLMIUM LASER WITH RIGHT STENT PLACEMENT;  Surgeon: Alexis Frock, MD;  Location: [facility];  Service: Urology;  Laterality: Right;   PARATHYROIDECTOMY     2-3 removed   REPLACEMENT TOTAL KNEE BILATERAL  1999, 2005   Right leg femur repaired  THYROIDECTOMY     TONSILLECTOMY     TUBAL LIGATION      Allergies  Allergen Reactions   Atorvastatin Nausea Only   Azithromycin Itching   Beta Adrenergic Blockers Other (See Comments)    Depression    Ciprofloxacin Other (See Comments)    Edgy and very jumpy     Codeine Nausea And Vomiting   Darvon [Propoxyphene] Nausea And Vomiting   Epinephrine Other (See Comments)    Rapid pulse, sweats   Klonopin [Clonazepam] Other (See Comments)    Makes her feel  very suicidal    Oxycodone Other (See Comments)    Patient felt like it altered her mental status "Crazy" . Hallucinations later as well.   Paxil [Paroxetine Hydrochloride] Other (See Comments)    Severe depression    Prednisone Other (See Comments)    "makes me feel really bad"   Propoxyphene Hcl Nausea And Vomiting   Tape Itching   Torsemide Other (See Comments)    Patient stated that she doesn't recall the reaction   Tramadol Other (See Comments)    Feels "jittery"   Vicodin [Hydrocodone-Acetaminophen] Other (See Comments)    Hallucinations   Meloxicam Diarrhea   Other Rash    Polyester sheets "cause me a rash"   Vancomycin Rash    Localized rash related to infusion rate    Allergies as of 03/14/2021       Reactions   Atorvastatin Nausea Only   Azithromycin Itching   Beta Adrenergic Blockers Other (See Comments)   Depression   Ciprofloxacin Other (See Comments)   Edgy and very jumpy    Codeine Nausea And Vomiting   Darvon [propoxyphene] Nausea And Vomiting   Epinephrine Other (See Comments)   Rapid pulse, sweats   Klonopin [clonazepam] Other (See Comments)   Makes her feel very suicidal    Oxycodone Other (See Comments)   Patient felt like it altered her mental status "Crazy" . Hallucinations later as well.   Paxil [paroxetine Hydrochloride] Other (See Comments)   Severe depression    Prednisone Other (See Comments)   "makes me feel really bad"   Propoxyphene Hcl Nausea And Vomiting   Tape Itching   Torsemide Other (See Comments)   Patient stated that she doesn't recall the reaction   Tramadol Other (See Comments)   Feels "jittery"   Vicodin [hydrocodone-acetaminophen] Other (See Comments)   Hallucinations   Meloxicam Diarrhea   Other Rash   Polyester sheets "cause me a rash"   Vancomycin Rash   Localized rash related to infusion rate        Medication List        Accurate as of March 14, 2021 11:23 AM. If you have any questions, ask your nurse or  doctor.          STOP taking these medications    potassium chloride 10 MEQ CR capsule Commonly known as: MICRO-K Stopped by: Virgie Dad, MD       TAKE these medications    acetaminophen 325 MG tablet Commonly known as: TYLENOL Take 650 mg by mouth every 4 (four) hours as needed.   aspirin 81 MG tablet Take 81 mg by mouth daily.   Citracal Petites/Vitamin D 200-250 MG-UNIT Tabs Generic drug: Calcium Citrate-Vitamin D Take 1 tablet by mouth every morning.   cyanocobalamin 2000 MCG tablet Take 2,000 mcg by mouth daily.   FIBER PO Take 1 tablet by mouth 2 (two) times daily.   ipratropium-albuterol 0.5-2.5 (3)  MG/3ML Soln Commonly known as: DUONEB Take 3 mLs by nebulization daily.   melatonin 5 MG Tabs Take 1 tablet (5 mg total) by mouth at bedtime.   memantine 10 MG tablet Commonly known as: NAMENDA TAKE 1 TABLET BY MOUTH TWICE DAILY.   metolazone 2.5 MG tablet Commonly known as: ZAROXOLYN TAKE 1 TABLET FIVE DAYS A WEEK, TAKE 2 TABLETS ON MONDAY AND FRIDAY   nystatin powder Commonly known as: MYCOSTATIN/NYSTOP Apply 1 application topically as needed.   omeprazole 20 MG capsule Commonly known as: PRILOSEC TAKE 1 CAPSULE TWICE DAILY BEFORE MEALS   OXYGEN Inhale 4 L into the lungs continuous. KEEP O2 SATS >= 90%. Every Shift   potassium chloride 10 MEQ tablet Commonly known as: KLOR-CON Take 20 mEq by mouth 2 (two) times daily.   QUEtiapine 25 MG tablet Commonly known as: SEROQUEL TAKE 1/2 TABLET AT BEDTIME.   saccharomyces boulardii 250 MG capsule Commonly known as: FLORASTOR Take 1 capsule (250 mg total) by mouth daily.   sertraline 100 MG tablet Commonly known as: ZOLOFT TAKE ONE TABLET BY MOUTH AT BEDTIME   spironolactone 25 MG tablet Commonly known as: ALDACTONE TAKE 1 TABLET ONCE DAILY.   Synthroid 137 MCG tablet Generic drug: levothyroxine TAKE 1 TABLET ONCE DAILY BEFORE BREAKFAST.        Review of Systems  Constitutional:   Positive for activity change.  HENT: Negative.    Respiratory:  Positive for cough and shortness of breath.   Cardiovascular: Negative.   Gastrointestinal: Negative.   Genitourinary: Negative.   Musculoskeletal:  Positive for gait problem.  Skin: Negative.   Neurological:  Positive for weakness.  Psychiatric/Behavioral: Negative.     Immunization History  Administered Date(s) Administered   Influenza Split 03/20/2011   Influenza Whole 03/19/2009, 03/19/2012   Influenza, High Dose Seasonal PF 03/16/2018, 04/20/2019, 03/31/2020   Influenza,inj,Quad PF,6+ Mos 03/19/2013   Influenza-Unspecified 03/03/2014, 03/15/2015, 03/30/2016, 04/05/2017   Meningococcal Polysaccharide 03/15/2015   Moderna Sars-Covid-2 Vaccination 06/22/2018, 07/21/2019, 04/27/2020, 11/16/2020   PPD Test 03/30/2016   Pneumococcal Conjugate-13 03/12/2014   Pneumococcal Polysaccharide-23 06/19/2006, 03/15/2015   Td 10/04/2009   Tdap 06/19/2005, 11/14/2018   Zoster Recombinat (Shingrix) 01/28/2018, 04/22/2018, 05/22/2018   Zoster, Live 06/19/2010   Pertinent  Health Maintenance Due  Topic Date Due   URINE MICROALBUMIN  Never done   FOOT EXAM  06/19/2020   OPHTHALMOLOGY EXAM  07/20/2020   INFLUENZA VACCINE  01/17/2021   HEMOGLOBIN A1C  05/27/2021   DEXA SCAN  Discontinued   Fall Risk  02/16/2021 08/06/2020 08/05/2020 05/06/2020 02/06/2020  Falls in the past year? 0 0 0 0 0  Number falls in past yr: 0 0 0 0 0  Injury with Fall? - - 0 - -  Risk for fall due to : - - - - -  Follow up Falls evaluation completed - - - -   Functional Status Survey:    Vitals:   03/14/21 1113  BP: 113/70  Pulse: 70  Resp: 18  Temp: 98 F (36.7 C)  SpO2: 92%  Weight: 254 lb 9.6 oz (115.5 kg)  Height: $Remove'5\' 8"'pBlsnke$  (1.727 m)   Body mass index is 38.71 kg/m. Physical Exam Vitals reviewed.  Constitutional:      Appearance: Normal appearance. She is obese.  HENT:     Head: Normocephalic.     Nose: Nose normal.     Mouth/Throat:      Mouth: Mucous membranes are moist.     Pharynx: Oropharynx is  clear.  Eyes:     Pupils: Pupils are equal, round, and reactive to light.  Cardiovascular:     Rate and Rhythm: Normal rate and regular rhythm.     Pulses: Normal pulses.  Pulmonary:     Breath sounds: No wheezing.     Comments: Bilateral Wheezing Abdominal:     General: Abdomen is flat. Bowel sounds are normal.     Palpations: Abdomen is soft.  Musculoskeletal:        General: Swelling present.     Cervical back: Neck supple.     Comments: At baseline Edema   Skin:    General: Skin is warm.  Neurological:     General: No focal deficit present.     Mental Status: She is alert and oriented to person, place, and time.  Psychiatric:        Mood and Affect: Mood normal.        Thought Content: Thought content normal.    Labs reviewed: Recent Labs    04/01/20 0000 11/25/20 0740 01/06/21 0524 01/17/21 0737 02/18/21 0539  NA 140   < > 140 136 140  K 4.1   < > 3.7 3.7 3.5  CL 101   < > 94* 95* 96*  CO2 31   < > 35* 33* 32  GLUCOSE 78   < > 89 102* 115*  BUN 13   < > $R'17 14 20  'UM$ CREATININE 0.98*   < > 0.70 0.90 0.98  CALCIUM 8.5*   < > 8.9 8.9 8.7*  MG 1.5  --   --   --   --    < > = values in this interval not displayed.   Recent Labs    11/25/20 0740 01/06/21 0524 02/18/21 0539  AST 12 12* 16  ALT $Re'7 11 14  'HDt$ ALKPHOS  --  69 67  BILITOT 0.4 0.7 0.5  PROT 6.2 6.1* 6.4*  ALBUMIN  --  3.0* 3.3*   Recent Labs    11/25/20 0740 01/01/21 0157 01/05/21 0406 01/06/21 0524 01/09/21 0600 01/17/21 0737 02/18/21 0539  WBC 6.7 8.0 7.3   < > 6.5 8.4 8.8  NEUTROABS 4,456 5.7 5.3  --   --   --   --   HGB 13.3 13.9 12.9   < > 13.8 15.0 12.6  HCT 41.2 44.7 41.5   < > 44.3 46.8* 40.2  MCV 87.7 89.8 91.6   < > 89.3 89.1 91.0  PLT 116* 129* 118*   < > 140* PLATELET CLUMPS NOTED ON SMEAR, UNABLE TO ESTIMATE 114*   < > = values in this interval not displayed.   Lab Results  Component Value Date   TSH 2.21  02/19/2021   Lab Results  Component Value Date   HGBA1C 6.1 (H) 11/25/2020   Lab Results  Component Value Date   CHOL 145 04/01/2020   HDL 56 04/01/2020   LDLCALC 71 04/01/2020   LDLDIRECT 99.4 03/13/2012   TRIG 95 04/01/2020   CHOLHDL 2.6 04/01/2020    Significant Diagnostic Results in last 30 days:  CT Angio Chest Pulmonary Embolism (PE) W or WO Contrast  Result Date: 02/18/2021 CLINICAL DATA:  High probability for pulmonary embolism. Lung cancer. EXAM: CT ANGIOGRAPHY CHEST WITH CONTRAST TECHNIQUE: Multidetector CT imaging of the chest was performed using the standard protocol during bolus administration of intravenous contrast. Multiplanar CT image reconstructions and MIPs were obtained to evaluate the vascular anatomy. CONTRAST:  57mL OMNIPAQUE IOHEXOL 350  MG/ML SOLN COMPARISON:  01/17/2021 FINDINGS: Cardiovascular: Satisfactory opacification of the pulmonary arteries to the segmental level. No evidence of pulmonary embolism when accounting for motion artifact especially affecting the bases. Normal heart size. No pericardial effusion. Atherosclerotic plaque involving the aorta. Mediastinum/Nodes: Amorphous soft tissue at the left hilum has diminished, likely both primary tumor and adenopathy. Left paratracheal node is also less thick than before. Postoperative right axilla and breast. Lungs/Pleura: Left suprahilar mass with left upper lobe collapse. The bulk of soft tissue density/tumor has diminished. Where measured on comparison at the left suprahilar region thickness has diminished from 19 mm to 15 mm. Elsewhere there is bronchomalacia with intermittent airway collapse. Patchy air trapping at the apices. No edema, pneumonia, or significant pleural fluid Upper Abdomen: Ill-defined mass at the anterior spleen with margins difficult to visualize partly due to contrast timing. The mass is long-standing based on prior imaging reports. Musculoskeletal: Generalized degenerative disease and  scoliosis. Multilevel bridging thoracic osteophytes. Severe glenohumeral osteoarthritis on the right. Review of the MIP images confirms the above findings. IMPRESSION: 1. No evidence of pulmonary embolism.  Intermittent motion artifact. 2. Left lung cancer with decreasing soft tissue mass when compared to CT 1 month ago. The left upper lobe remains collapsed/obstructed. Electronically Signed   By: Monte Fantasia M.D.   On: 02/18/2021 08:05    Assessment/Plan Mixed simple and mucopurulent chronic bronchitis ([place]) Start on prednisone 40 mg Taper to 10 mg and leave it at that for now Nebs QID Also Ativan 0.5 mg BID Prn for Anxiety  Lung mass S/p Radiation therapy No More treatments per Radiation for now and No more Work up per Pulmonary and Oncology due to her refusal for Tissue Biopsy  Discussed with her She is agreeable for Hospice   Chronic diastolic CHF (congestive heart failure) ([place]) On Metolazone  Allergic to Torsemide Has lost weight but looks Euvolemic Mild Cognition impairment Discontinue Namenda to reduce Pill burden   Other issues  Essential hypertension On Aldactone   Hallucination, visual On low-dose of Seroquel Previous work-up has been negative Prediabetes A1c stable Hypothyroidism (acquired) TSH Normal Depression with anxiety On Zoloft OSA (obstructive sleep apnea) On Oxygen  Family/ staff Communication:   Labs/tests ordered:     Total time spent in this patient care encounter was  45_  minutes; greater than 50% of the visit spent counseling patient and staff, reviewing records , Labs and coordinating care for problems addressed at this encounter.

## 2021-03-15 ENCOUNTER — Non-Acute Institutional Stay (SKILLED_NURSING_FACILITY): Payer: Medicare Other | Admitting: Orthopedic Surgery

## 2021-03-15 DIAGNOSIS — R062 Wheezing: Secondary | ICD-10-CM

## 2021-03-15 DIAGNOSIS — F0151 Vascular dementia with behavioral disturbance: Secondary | ICD-10-CM | POA: Diagnosis not present

## 2021-03-15 DIAGNOSIS — I10 Essential (primary) hypertension: Secondary | ICD-10-CM | POA: Diagnosis not present

## 2021-03-15 DIAGNOSIS — G4733 Obstructive sleep apnea (adult) (pediatric): Secondary | ICD-10-CM | POA: Diagnosis not present

## 2021-03-15 DIAGNOSIS — F01518 Vascular dementia, unspecified severity, with other behavioral disturbance: Secondary | ICD-10-CM

## 2021-03-15 DIAGNOSIS — I110 Hypertensive heart disease with heart failure: Secondary | ICD-10-CM | POA: Diagnosis not present

## 2021-03-15 DIAGNOSIS — R29898 Other symptoms and signs involving the musculoskeletal system: Secondary | ICD-10-CM | POA: Diagnosis not present

## 2021-03-15 DIAGNOSIS — R441 Visual hallucinations: Secondary | ICD-10-CM

## 2021-03-15 DIAGNOSIS — R918 Other nonspecific abnormal finding of lung field: Secondary | ICD-10-CM | POA: Diagnosis not present

## 2021-03-15 DIAGNOSIS — K219 Gastro-esophageal reflux disease without esophagitis: Secondary | ICD-10-CM | POA: Diagnosis not present

## 2021-03-15 DIAGNOSIS — R7303 Prediabetes: Secondary | ICD-10-CM

## 2021-03-15 DIAGNOSIS — I5032 Chronic diastolic (congestive) heart failure: Secondary | ICD-10-CM

## 2021-03-15 DIAGNOSIS — M6281 Muscle weakness (generalized): Secondary | ICD-10-CM | POA: Diagnosis not present

## 2021-03-15 DIAGNOSIS — F418 Other specified anxiety disorders: Secondary | ICD-10-CM | POA: Diagnosis not present

## 2021-03-15 DIAGNOSIS — J9809 Other diseases of bronchus, not elsewhere classified: Secondary | ICD-10-CM | POA: Diagnosis not present

## 2021-03-15 DIAGNOSIS — R531 Weakness: Secondary | ICD-10-CM | POA: Diagnosis not present

## 2021-03-15 DIAGNOSIS — R2681 Unsteadiness on feet: Secondary | ICD-10-CM | POA: Diagnosis not present

## 2021-03-16 ENCOUNTER — Encounter: Payer: Self-pay | Admitting: Orthopedic Surgery

## 2021-03-16 DIAGNOSIS — F419 Anxiety disorder, unspecified: Secondary | ICD-10-CM | POA: Diagnosis not present

## 2021-03-16 DIAGNOSIS — E785 Hyperlipidemia, unspecified: Secondary | ICD-10-CM | POA: Diagnosis not present

## 2021-03-16 DIAGNOSIS — I5032 Chronic diastolic (congestive) heart failure: Secondary | ICD-10-CM | POA: Diagnosis not present

## 2021-03-16 DIAGNOSIS — F32A Depression, unspecified: Secondary | ICD-10-CM | POA: Diagnosis not present

## 2021-03-16 DIAGNOSIS — Z6838 Body mass index (BMI) 38.0-38.9, adult: Secondary | ICD-10-CM | POA: Diagnosis not present

## 2021-03-16 DIAGNOSIS — K219 Gastro-esophageal reflux disease without esophagitis: Secondary | ICD-10-CM | POA: Diagnosis not present

## 2021-03-16 DIAGNOSIS — Z87891 Personal history of nicotine dependence: Secondary | ICD-10-CM | POA: Diagnosis not present

## 2021-03-16 DIAGNOSIS — K589 Irritable bowel syndrome without diarrhea: Secondary | ICD-10-CM | POA: Diagnosis not present

## 2021-03-16 DIAGNOSIS — Z853 Personal history of malignant neoplasm of breast: Secondary | ICD-10-CM | POA: Diagnosis not present

## 2021-03-16 DIAGNOSIS — I130 Hypertensive heart and chronic kidney disease with heart failure and stage 1 through stage 4 chronic kidney disease, or unspecified chronic kidney disease: Secondary | ICD-10-CM | POA: Diagnosis not present

## 2021-03-16 DIAGNOSIS — C3492 Malignant neoplasm of unspecified part of left bronchus or lung: Secondary | ICD-10-CM | POA: Diagnosis not present

## 2021-03-16 DIAGNOSIS — F0150 Vascular dementia without behavioral disturbance: Secondary | ICD-10-CM | POA: Diagnosis not present

## 2021-03-16 DIAGNOSIS — N183 Chronic kidney disease, stage 3 unspecified: Secondary | ICD-10-CM | POA: Diagnosis not present

## 2021-03-16 DIAGNOSIS — E1122 Type 2 diabetes mellitus with diabetic chronic kidney disease: Secondary | ICD-10-CM | POA: Diagnosis not present

## 2021-03-16 DIAGNOSIS — J302 Other seasonal allergic rhinitis: Secondary | ICD-10-CM | POA: Diagnosis not present

## 2021-03-16 DIAGNOSIS — E039 Hypothyroidism, unspecified: Secondary | ICD-10-CM | POA: Diagnosis not present

## 2021-03-16 NOTE — Progress Notes (Signed)
Location:   [facility] Room Number: 35-A Place of Service:  SNF (31) Provider:  Windell Moulding, NP    Patient Care Team: Virgie Dad, MD as PCP - General (Internal Medicine) Minus Breeding, MD as PCP - Cardiology (Cardiology) Azucena Fallen, MD as Consulting Physician (Obstetrics and Gynecology) Chesley Mires, MD as Consulting Physician (Pulmonary Disease) Mast, Man X, NP as Nurse Practitioner (Internal Medicine) Christin Fudge, MD (Inactive) as Consulting Physician (Surgery)  Extended Emergency Contact Information Primary Emergency Contact: Boxman,Lee Address: [address]          [place], [place] [address] Johnnette Litter of [place] Phone: [phone] Relation: Daughter Secondary Emergency Contact: Ritta Slot [place] of [place] Mobile Phone: [phone] Relation: Daughter  Code Status:  DNR Goals of care: Advanced Directive information Advanced Directives 03/16/2021  Does Patient Have a Medical Advance Directive? Yes  Type of Paramedic of [place];Living will;Out of facility DNR (pink MOST or yellow form)  Does patient want to make changes to medical advance directive? No - Patient declined  Copy of [place] in Chart? Yes - validated most recent copy scanned in chart (See row information)  Would patient like information on creating a medical advance directive? -  Pre-existing out of facility DNR order (yellow form or pink MOST form) -     Chief Complaint  Patient presents with   Acute Visit    Right side chest pain.    HPI:  Pt is a 87 y.o. female seen today for an acute visit for left sided chest pain.   Lung Mass- She was hospitalized 07/20-07/25 due to left upper lobe collapse due to malignancy. She is currently followed by Dr. Lisbeth Renshaw and palliative radiation recently completed. Pulmonary and oncology do not recommend further workup due to her refusal to have biopsy done. Hospice  consulted 09/26. She uses oxygen 3-4 liters prn. Admits to mild discomfort to left upper chest when taking a deep breath, she was taking tylenol 650 mg with relief, but it has not been helping in the past 24 hours. Chest pain intermittent, unsure if related to heartburn. She is requesting a stronger dose of tylenol today.   Wheezing- increased wheezing in past few days, prednisone 20 mg started 09/26, also receiving nebs 4x/daily CHF- no recent weight fluctuations or increased ankle edema, BNP 93 02/18/2021, remains on metolazone due to torsemide allergy and spirolactone.  Prediabetes- a1c 6.1 11/2020, no medication at this time, carb modified diet HTN- BUN/creat 20/0.98 02/18/2021, remains on diuretics for CHF OSA- continues to use CPAP qhs Dementia- MMSE 30/30 in past, CT head indicates moderate chronic small vessel disease, no recent behavioral outbursts, namenda recently discontinued Hallucinations- remains on Seroquel  Hypothyroidism- TSH 4.59 11/2020, synthroid daily GERD-  hgb 12.6 09/02, no increased reflux, prilosec daily Depression/anxiety- remains on zoloft     Past Medical History:  Diagnosis Date   Altered mental status    ARTHRITIS, KNEES, BILATERAL    Breast CA ([facility]) 2000   s/p R lumpectomy and XRT   CHF (congestive heart failure) (HCC)    DEPRESSION    DJD (degenerative joint disease) of knee    GERD    HYPERLIPIDEMIA    HYPERTENSION    HYPOTHYROIDISM    postsurgical   Hypoxia    MIGRAINE HEADACHE    Morbid obesity (HCC)    OSA (obstructive sleep apnea) 01/17/2011 dx   Oxygen dependent    Urge incontinence  UTI (lower urinary tract infection) 12/2014   Past Surgical History:  Procedure Laterality Date   ABDOMINAL HYSTERECTOMY     ADENOIDECTOMY     BREAST BIOPSY  2000   BREAST LUMPECTOMY  2000   CATARACT EXTRACTION     CHOLECYSTECTOMY  1995   CYSTOSCOPY/URETEROSCOPY/HOLMIUM LASER Right 06/28/2015   Procedure: CYSTOSCOPY RIGHT RETROGRAD RIGHT  URETEROSCOPY/HOLMIUM LASER WITH RIGHT STENT PLACEMENT;  Surgeon: Alexis Frock, MD;  Location: [facility];  Service: Urology;  Laterality: Right;   PARATHYROIDECTOMY     2-3 removed   REPLACEMENT TOTAL KNEE BILATERAL  1999, 2005   Right leg femur repaired     THYROIDECTOMY     TONSILLECTOMY     TUBAL LIGATION      Allergies  Allergen Reactions   Atorvastatin Nausea Only   Azithromycin Itching   Beta Adrenergic Blockers Other (See Comments)    Depression    Ciprofloxacin Other (See Comments)    Edgy and very jumpy     Codeine Nausea And Vomiting   Darvon [Propoxyphene] Nausea And Vomiting   Epinephrine Other (See Comments)    Rapid pulse, sweats   Klonopin [Clonazepam] Other (See Comments)    Makes her feel very suicidal    Oxycodone Other (See Comments)    Patient felt like it altered her mental status "Crazy" . Hallucinations later as well.   Paxil [Paroxetine Hydrochloride] Other (See Comments)    Severe depression    Prednisone Other (See Comments)    "makes me feel really bad"   Propoxyphene Hcl Nausea And Vomiting   Tape Itching   Torsemide Other (See Comments)    Patient stated that she doesn't recall the reaction   Tramadol Other (See Comments)    Feels "jittery"   Vicodin [Hydrocodone-Acetaminophen] Other (See Comments)    Hallucinations   Meloxicam Diarrhea   Other Rash    Polyester sheets "cause me a rash"   Vancomycin Rash    Localized rash related to infusion rate    Allergies as of 03/15/2021       Reactions   Atorvastatin Nausea Only   Azithromycin Itching   Beta Adrenergic Blockers Other (See Comments)   Depression   Ciprofloxacin Other (See Comments)   Edgy and very jumpy    Codeine Nausea And Vomiting   Darvon [propoxyphene] Nausea And Vomiting   Epinephrine Other (See Comments)   Rapid pulse, sweats   Klonopin [clonazepam] Other (See Comments)   Makes her feel very suicidal    Oxycodone Other (See Comments)   Patient felt like it altered  her mental status "Crazy" . Hallucinations later as well.   Paxil [paroxetine Hydrochloride] Other (See Comments)   Severe depression    Prednisone Other (See Comments)   "makes me feel really bad"   Propoxyphene Hcl Nausea And Vomiting   Tape Itching   Torsemide Other (See Comments)   Patient stated that she doesn't recall the reaction   Tramadol Other (See Comments)   Feels "jittery"   Vicodin [hydrocodone-acetaminophen] Other (See Comments)   Hallucinations   Meloxicam Diarrhea   Other Rash   Polyester sheets "cause me a rash"   Vancomycin Rash   Localized rash related to infusion rate        Medication List        Accurate as of March 15, 2021 11:59 PM. If you have any questions, ask your nurse or doctor.          acetaminophen 500 MG tablet Commonly  known as: TYLENOL Take 500 mg by mouth 2 (two) times daily.   acetaminophen 325 MG tablet Commonly known as: TYLENOL Take 650 mg by mouth every 4 (four) hours as needed.   aspirin 81 MG tablet Take 81 mg by mouth daily.   Calcium Polycarbophil 625 MG Chew Chew 1 tablet by mouth 2 (two) times daily.   calcium-vitamin D 500-200 MG-UNIT tablet Commonly known as: OSCAL WITH D Take 1 tablet by mouth 3 (three) times daily as needed.   Citracal Petites/Vitamin D 200-250 MG-UNIT Tabs Generic drug: Calcium Citrate-Vitamin D Take 1 tablet by mouth every morning.   cyanocobalamin 2000 MCG tablet Take 2,000 mcg by mouth daily.   ipratropium-albuterol 0.5-2.5 (3) MG/3ML Soln Commonly known as: DUONEB Take 3 mLs by nebulization in the morning, at noon, in the evening, and at bedtime.   LORazepam 0.5 MG tablet Commonly known as: ATIVAN Take 0.5 mg by mouth 2 (two) times daily.   melatonin 5 MG Tabs Take 1 tablet (5 mg total) by mouth at bedtime.   metolazone 2.5 MG tablet Commonly known as: ZAROXOLYN TAKE 1 TABLET FIVE DAYS A WEEK, TAKE 2 TABLETS ON MONDAY AND FRIDAY   nystatin powder Commonly known as:  MYCOSTATIN/NYSTOP Apply 1 application topically as needed.   omeprazole 20 MG capsule Commonly known as: PRILOSEC TAKE 1 CAPSULE TWICE DAILY BEFORE MEALS   OXYGEN Inhale 4 L into the lungs continuous. KEEP O2 SATS >= 90%. Every Shift   potassium chloride 10 MEQ tablet Commonly known as: KLOR-CON Take 20 mEq by mouth 2 (two) times daily.   predniSONE 20 MG tablet Commonly known as: DELTASONE Take 20 mg by mouth daily.   QUEtiapine 25 MG tablet Commonly known as: SEROQUEL TAKE 1/2 TABLET AT BEDTIME.   saccharomyces boulardii 250 MG capsule Commonly known as: FLORASTOR Take 1 capsule (250 mg total) by mouth daily.   sertraline 100 MG tablet Commonly known as: ZOLOFT TAKE ONE TABLET BY MOUTH AT BEDTIME   spironolactone 25 MG tablet Commonly known as: ALDACTONE TAKE 1 TABLET ONCE DAILY.   Synthroid 137 MCG tablet Generic drug: levothyroxine TAKE 1 TABLET ONCE DAILY BEFORE BREAKFAST.        Review of Systems  Constitutional:  Negative for activity change, appetite change, chills, fatigue and fever.  HENT:  Negative for congestion, hearing loss and trouble swallowing.   Eyes:  Negative for visual disturbance.  Respiratory:  Positive for cough, shortness of breath and wheezing.        Oxygen use  Cardiovascular:  Positive for chest pain and leg swelling.  Gastrointestinal:  Negative for abdominal distention, abdominal pain, blood in stool, constipation, diarrhea and nausea.  Genitourinary:  Negative for dysuria, hematuria and urgency.  Musculoskeletal:  Positive for arthralgias, gait problem and myalgias.  Skin: Negative.   Neurological:  Positive for weakness. Negative for dizziness and headaches.  Psychiatric/Behavioral:  Positive for confusion and dysphoric mood. Negative for sleep disturbance. The patient is nervous/anxious.    Immunization History  Administered Date(s) Administered   Influenza Split 03/20/2011   Influenza Whole 03/19/2009, 03/19/2012    Influenza, High Dose Seasonal PF 03/16/2018, 04/20/2019, 03/31/2020   Influenza,inj,Quad PF,6+ Mos 03/19/2013   Influenza-Unspecified 03/03/2014, 03/15/2015, 03/30/2016, 04/05/2017   Meningococcal Polysaccharide 03/15/2015   Moderna Sars-Covid-2 Vaccination 06/22/2018, 07/21/2019, 04/27/2020, 11/16/2020   PPD Test 03/30/2016   Pneumococcal Conjugate-13 03/12/2014   Pneumococcal Polysaccharide-23 06/19/2006, 03/15/2015   Td 10/04/2009   Tdap 06/19/2005, 11/14/2018   Zoster Recombinat (Shingrix) 01/28/2018, 04/22/2018,  05/22/2018   Zoster, Live 06/19/2010   Pertinent  Health Maintenance Due  Topic Date Due   URINE MICROALBUMIN  Never done   FOOT EXAM  06/19/2020   OPHTHALMOLOGY EXAM  07/20/2020   INFLUENZA VACCINE  01/17/2021   HEMOGLOBIN A1C  05/27/2021   DEXA SCAN  Discontinued   Fall Risk  02/16/2021 08/06/2020 08/05/2020 05/06/2020 02/06/2020  Falls in the past year? 0 0 0 0 0  Number falls in past yr: 0 0 0 0 0  Injury with Fall? - - 0 - -  Risk for fall due to : - - - - -  Follow up Falls evaluation completed - - - -   Functional Status Survey:    Vitals:   03/16/21 1036  BP: 113/70  Pulse: 70  Resp: 18  Temp: 97.7 F (36.5 C)  SpO2: 90%  Weight: 254 lb 9.6 oz (115.5 kg)  Height: $Remove'5\' 8"'YwNUIKN$  (1.727 m)   Body mass index is 38.71 kg/m. Physical Exam Vitals reviewed.  Constitutional:      General: She is not in acute distress. HENT:     Head: Normocephalic.  Eyes:     General:        Right eye: No discharge.        Left eye: No discharge.  Neck:     Vascular: No carotid bruit.  Cardiovascular:     Rate and Rhythm: Normal rate and regular rhythm.     Pulses: Normal pulses.     Heart sounds: Normal heart sounds. No murmur heard. Pulmonary:     Effort: Pulmonary effort is normal. No respiratory distress.     Breath sounds: Examination of the right-upper field reveals wheezing. Examination of the left-upper field reveals wheezing. Wheezing present.     Comments:  Oxygen 3-4 liters prn Chest:     Comments: Tenderness when pressing over left chest area. No injuries or deformities noted.  Abdominal:     General: Bowel sounds are normal. There is no distension.     Palpations: Abdomen is soft.     Tenderness: There is no abdominal tenderness.  Musculoskeletal:     Cervical back: Normal range of motion.     Right lower leg: Edema present.     Left lower leg: Edema present.     Comments: Non-pitting  Lymphadenopathy:     Cervical: No cervical adenopathy.  Skin:    General: Skin is warm and dry.     Capillary Refill: Capillary refill takes less than 2 seconds.  Neurological:     General: No focal deficit present.     Mental Status: She is alert. Mental status is at baseline.     Motor: Weakness present.     Gait: Gait abnormal.     Comments: wheelchair  Psychiatric:        Mood and Affect: Mood normal.        Behavior: Behavior normal.    Labs reviewed: Recent Labs    04/01/20 0000 11/25/20 0740 01/06/21 0524 01/17/21 0737 02/18/21 0539  NA 140   < > 140 136 140  K 4.1   < > 3.7 3.7 3.5  CL 101   < > 94* 95* 96*  CO2 31   < > 35* 33* 32  GLUCOSE 78   < > 89 102* 115*  BUN 13   < > $R'17 14 20  'qh$ CREATININE 0.98*   < > 0.70 0.90 0.98  CALCIUM 8.5*   < > 8.9  8.9 8.7*  MG 1.5  --   --   --   --    < > = values in this interval not displayed.   Recent Labs    11/25/20 0740 01/06/21 0524 02/18/21 0539  AST 12 12* 16  ALT 7 11 14   ALKPHOS  --  69 67  BILITOT 0.4 0.7 0.5  PROT 6.2 6.1* 6.4*  ALBUMIN  --  3.0* 3.3*   Recent Labs    11/25/20 0740 01/01/21 0157 01/05/21 0406 01/06/21 0524 01/09/21 0600 01/17/21 0737 02/18/21 0539  WBC 6.7 8.0 7.3   < > 6.5 8.4 8.8  NEUTROABS 4,456 5.7 5.3  --   --   --   --   HGB 13.3 13.9 12.9   < > 13.8 15.0 12.6  HCT 41.2 44.7 41.5   < > 44.3 46.8* 40.2  MCV 87.7 89.8 91.6   < > 89.3 89.1 91.0  PLT 116* 129* 118*   < > 140* PLATELET CLUMPS NOTED ON SMEAR, UNABLE TO ESTIMATE 114*   < > =  values in this interval not displayed.   Lab Results  Component Value Date   TSH 2.21 02/19/2021   Lab Results  Component Value Date   HGBA1C 6.1 (H) 11/25/2020   Lab Results  Component Value Date   CHOL 145 04/01/2020   HDL 56 04/01/2020   LDLCALC 71 04/01/2020   LDLDIRECT 99.4 03/13/2012   TRIG 95 04/01/2020   CHOLHDL 2.6 04/01/2020    Significant Diagnostic Results in last 30 days:  CT Angio Chest Pulmonary Embolism (PE) W or WO Contrast  Result Date: 02/18/2021 CLINICAL DATA:  High probability for pulmonary embolism. Lung cancer. EXAM: CT ANGIOGRAPHY CHEST WITH CONTRAST TECHNIQUE: Multidetector CT imaging of the chest was performed using the standard protocol during bolus administration of intravenous contrast. Multiplanar CT image reconstructions and MIPs were obtained to evaluate the vascular anatomy. CONTRAST:  45mL OMNIPAQUE IOHEXOL 350 MG/ML SOLN COMPARISON:  01/17/2021 FINDINGS: Cardiovascular: Satisfactory opacification of the pulmonary arteries to the segmental level. No evidence of pulmonary embolism when accounting for motion artifact especially affecting the bases. Normal heart size. No pericardial effusion. Atherosclerotic plaque involving the aorta. Mediastinum/Nodes: Amorphous soft tissue at the left hilum has diminished, likely both primary tumor and adenopathy. Left paratracheal node is also less thick than before. Postoperative right axilla and breast. Lungs/Pleura: Left suprahilar mass with left upper lobe collapse. The bulk of soft tissue density/tumor has diminished. Where measured on comparison at the left suprahilar region thickness has diminished from 19 mm to 15 mm. Elsewhere there is bronchomalacia with intermittent airway collapse. Patchy air trapping at the apices. No edema, pneumonia, or significant pleural fluid Upper Abdomen: Ill-defined mass at the anterior spleen with margins difficult to visualize partly due to contrast timing. The mass is long-standing  based on prior imaging reports. Musculoskeletal: Generalized degenerative disease and scoliosis. Multilevel bridging thoracic osteophytes. Severe glenohumeral osteoarthritis on the right. Review of the MIP images confirms the above findings. IMPRESSION: 1. No evidence of pulmonary embolism.  Intermittent motion artifact. 2. Left lung cancer with decreasing soft tissue mass when compared to CT 1 month ago. The left upper lobe remains collapsed/obstructed. Electronically Signed   By: 03/19/2021 M.D.   On: 02/18/2021 08:05    Assessment/Plan 1. Lung mass - Malignant mass to left upper lobe - completed palliative radiation - pulmonary and oncology do not recommend further workup without biopsy- refused by patient - hospice consulted 09/26 -  exam unremarkable - suspect left chest pain related to lung mass - increase tylenol 1000 mg po bid  2. Wheezing - expiratory wheezing during exam - prednisone 20 mg stared - cont duonebs QID  3. Chronic diastolic CHF (congestive heart failure) (HCC) - no weight fluctuations, sob, non-pitting edema - cont metolazone/ allergy to torsemide - cont spironolactone - cont NAS diet  4. Prediabetes - a1c 6.1 11/2020 - cont low carb diet  5. Essential hypertension - controlled - on diuretics  6. OSA (obstructive sleep apnea) - cont CPAP use qhs  7. Vascular dementia with behavior disturbance - remains high functioning - namenda recently discontinued   8. Hallucination, visual - no recent hallucinations - cont Seroquel  9. Gastroesophageal reflux disease without esophagitis - left chest pain maybe related to reflux - increase Prilosec to 40 mg daily  10. Depression with anxiety - stable with zoloft and ativan prn    Family/ staff Communication: plan discussed with patient and nurse  Labs/tests ordered:  none

## 2021-03-17 DIAGNOSIS — I5032 Chronic diastolic (congestive) heart failure: Secondary | ICD-10-CM | POA: Diagnosis not present

## 2021-03-17 DIAGNOSIS — E1122 Type 2 diabetes mellitus with diabetic chronic kidney disease: Secondary | ICD-10-CM | POA: Diagnosis not present

## 2021-03-17 DIAGNOSIS — F0150 Vascular dementia without behavioral disturbance: Secondary | ICD-10-CM | POA: Diagnosis not present

## 2021-03-17 DIAGNOSIS — I130 Hypertensive heart and chronic kidney disease with heart failure and stage 1 through stage 4 chronic kidney disease, or unspecified chronic kidney disease: Secondary | ICD-10-CM | POA: Diagnosis not present

## 2021-03-17 DIAGNOSIS — N183 Chronic kidney disease, stage 3 unspecified: Secondary | ICD-10-CM | POA: Diagnosis not present

## 2021-03-17 DIAGNOSIS — C3492 Malignant neoplasm of unspecified part of left bronchus or lung: Secondary | ICD-10-CM | POA: Diagnosis not present

## 2021-03-19 DIAGNOSIS — J302 Other seasonal allergic rhinitis: Secondary | ICD-10-CM | POA: Diagnosis not present

## 2021-03-19 DIAGNOSIS — N183 Chronic kidney disease, stage 3 unspecified: Secondary | ICD-10-CM | POA: Diagnosis not present

## 2021-03-19 DIAGNOSIS — Z853 Personal history of malignant neoplasm of breast: Secondary | ICD-10-CM | POA: Diagnosis not present

## 2021-03-19 DIAGNOSIS — E785 Hyperlipidemia, unspecified: Secondary | ICD-10-CM | POA: Diagnosis not present

## 2021-03-19 DIAGNOSIS — I130 Hypertensive heart and chronic kidney disease with heart failure and stage 1 through stage 4 chronic kidney disease, or unspecified chronic kidney disease: Secondary | ICD-10-CM | POA: Diagnosis not present

## 2021-03-19 DIAGNOSIS — Z87891 Personal history of nicotine dependence: Secondary | ICD-10-CM | POA: Diagnosis not present

## 2021-03-19 DIAGNOSIS — F0150 Vascular dementia without behavioral disturbance: Secondary | ICD-10-CM | POA: Diagnosis not present

## 2021-03-19 DIAGNOSIS — K219 Gastro-esophageal reflux disease without esophagitis: Secondary | ICD-10-CM | POA: Diagnosis not present

## 2021-03-19 DIAGNOSIS — F32A Depression, unspecified: Secondary | ICD-10-CM | POA: Diagnosis not present

## 2021-03-19 DIAGNOSIS — E1122 Type 2 diabetes mellitus with diabetic chronic kidney disease: Secondary | ICD-10-CM | POA: Diagnosis not present

## 2021-03-19 DIAGNOSIS — K589 Irritable bowel syndrome without diarrhea: Secondary | ICD-10-CM | POA: Diagnosis not present

## 2021-03-19 DIAGNOSIS — E039 Hypothyroidism, unspecified: Secondary | ICD-10-CM | POA: Diagnosis not present

## 2021-03-19 DIAGNOSIS — I5032 Chronic diastolic (congestive) heart failure: Secondary | ICD-10-CM | POA: Diagnosis not present

## 2021-03-19 DIAGNOSIS — C3492 Malignant neoplasm of unspecified part of left bronchus or lung: Secondary | ICD-10-CM | POA: Diagnosis not present

## 2021-03-19 DIAGNOSIS — F419 Anxiety disorder, unspecified: Secondary | ICD-10-CM | POA: Diagnosis not present

## 2021-03-19 DIAGNOSIS — Z6838 Body mass index (BMI) 38.0-38.9, adult: Secondary | ICD-10-CM | POA: Diagnosis not present

## 2021-03-21 DIAGNOSIS — E1122 Type 2 diabetes mellitus with diabetic chronic kidney disease: Secondary | ICD-10-CM | POA: Diagnosis not present

## 2021-03-21 DIAGNOSIS — N183 Chronic kidney disease, stage 3 unspecified: Secondary | ICD-10-CM | POA: Diagnosis not present

## 2021-03-21 DIAGNOSIS — I5032 Chronic diastolic (congestive) heart failure: Secondary | ICD-10-CM | POA: Diagnosis not present

## 2021-03-21 DIAGNOSIS — C3492 Malignant neoplasm of unspecified part of left bronchus or lung: Secondary | ICD-10-CM | POA: Diagnosis not present

## 2021-03-21 DIAGNOSIS — I130 Hypertensive heart and chronic kidney disease with heart failure and stage 1 through stage 4 chronic kidney disease, or unspecified chronic kidney disease: Secondary | ICD-10-CM | POA: Diagnosis not present

## 2021-03-21 DIAGNOSIS — F0150 Vascular dementia without behavioral disturbance: Secondary | ICD-10-CM | POA: Diagnosis not present

## 2021-03-28 ENCOUNTER — Ambulatory Visit
Admission: RE | Admit: 2021-03-28 | Discharge: 2021-03-28 | Disposition: A | Discharge status: Home / Self Care | Payer: Medicare Other | Source: Ambulatory Visit | Attending: Radiation Oncology | Admitting: Radiation Oncology

## 2021-03-28 DIAGNOSIS — R918 Other nonspecific abnormal finding of lung field: Secondary | ICD-10-CM | POA: Insufficient documentation

## 2021-03-28 NOTE — Progress Notes (Signed)
  Radiation Oncology         [phone] ________________________________  Name: Tamara Morales MRN: [id number]  Date of Service: 03/28/2021  DOB: Dec 08, 1930  Post Treatment Telephone Note  Diagnosis:   Putative Stage III, NSCLC of the LUL  Interval Since Last Radiation:  5 weeks   02/09/2021 through 02/24/2021 Site Technique Total Dose (Gy) Dose per Fx (Gy) Completed Fx Beam Energies  Lung, Left: Lung_Lt 3D 30/30 3 10/10 6X, 10X    Narrative:  The patient's daughter [name] was contacted today. During treatment she did very well with radiotherapy and did not have significant desquamation. Her daughter states she's doing well. Family is adjusting to this path, but the patient is steadfast in her confidence about maintaining her goals of care.  Impression/Plan: 1. Putative Stage III, NSCLC of the LUL. The patient has been doing well since completion of radiotherapy. We discussed that we would be happy to continue to be available with her care team as needed. [name] will continue her care under hospice care.       Carola Rhine, PAC

## 2021-03-29 DIAGNOSIS — F0150 Vascular dementia without behavioral disturbance: Secondary | ICD-10-CM | POA: Diagnosis not present

## 2021-03-29 DIAGNOSIS — N183 Chronic kidney disease, stage 3 unspecified: Secondary | ICD-10-CM | POA: Diagnosis not present

## 2021-03-29 DIAGNOSIS — I5032 Chronic diastolic (congestive) heart failure: Secondary | ICD-10-CM | POA: Diagnosis not present

## 2021-03-29 DIAGNOSIS — C3492 Malignant neoplasm of unspecified part of left bronchus or lung: Secondary | ICD-10-CM | POA: Diagnosis not present

## 2021-03-29 DIAGNOSIS — I130 Hypertensive heart and chronic kidney disease with heart failure and stage 1 through stage 4 chronic kidney disease, or unspecified chronic kidney disease: Secondary | ICD-10-CM | POA: Diagnosis not present

## 2021-03-29 DIAGNOSIS — E1122 Type 2 diabetes mellitus with diabetic chronic kidney disease: Secondary | ICD-10-CM | POA: Diagnosis not present

## 2021-03-30 DIAGNOSIS — N183 Chronic kidney disease, stage 3 unspecified: Secondary | ICD-10-CM | POA: Diagnosis not present

## 2021-03-30 DIAGNOSIS — C3492 Malignant neoplasm of unspecified part of left bronchus or lung: Secondary | ICD-10-CM | POA: Diagnosis not present

## 2021-03-30 DIAGNOSIS — E1122 Type 2 diabetes mellitus with diabetic chronic kidney disease: Secondary | ICD-10-CM | POA: Diagnosis not present

## 2021-03-30 DIAGNOSIS — F0150 Vascular dementia without behavioral disturbance: Secondary | ICD-10-CM | POA: Diagnosis not present

## 2021-03-30 DIAGNOSIS — I130 Hypertensive heart and chronic kidney disease with heart failure and stage 1 through stage 4 chronic kidney disease, or unspecified chronic kidney disease: Secondary | ICD-10-CM | POA: Diagnosis not present

## 2021-03-30 DIAGNOSIS — I5032 Chronic diastolic (congestive) heart failure: Secondary | ICD-10-CM | POA: Diagnosis not present

## 2021-04-04 ENCOUNTER — Non-Acute Institutional Stay (SKILLED_NURSING_FACILITY): Payer: Medicare Other | Admitting: Nurse Practitioner

## 2021-04-04 ENCOUNTER — Encounter: Payer: Self-pay | Admitting: Nurse Practitioner

## 2021-04-04 DIAGNOSIS — I10 Essential (primary) hypertension: Secondary | ICD-10-CM

## 2021-04-04 DIAGNOSIS — R7303 Prediabetes: Secondary | ICD-10-CM

## 2021-04-04 DIAGNOSIS — R441 Visual hallucinations: Secondary | ICD-10-CM

## 2021-04-04 DIAGNOSIS — E785 Hyperlipidemia, unspecified: Secondary | ICD-10-CM

## 2021-04-04 DIAGNOSIS — F01A4 Vascular dementia, mild, with anxiety: Secondary | ICD-10-CM | POA: Diagnosis not present

## 2021-04-04 DIAGNOSIS — F418 Other specified anxiety disorders: Secondary | ICD-10-CM

## 2021-04-04 DIAGNOSIS — R053 Chronic cough: Secondary | ICD-10-CM | POA: Diagnosis not present

## 2021-04-04 DIAGNOSIS — F0150 Vascular dementia without behavioral disturbance: Secondary | ICD-10-CM | POA: Diagnosis not present

## 2021-04-04 DIAGNOSIS — E039 Hypothyroidism, unspecified: Secondary | ICD-10-CM

## 2021-04-04 DIAGNOSIS — K219 Gastro-esophageal reflux disease without esophagitis: Secondary | ICD-10-CM

## 2021-04-04 DIAGNOSIS — I5032 Chronic diastolic (congestive) heart failure: Secondary | ICD-10-CM

## 2021-04-04 DIAGNOSIS — R918 Other nonspecific abnormal finding of lung field: Secondary | ICD-10-CM | POA: Diagnosis not present

## 2021-04-04 DIAGNOSIS — G4733 Obstructive sleep apnea (adult) (pediatric): Secondary | ICD-10-CM | POA: Diagnosis not present

## 2021-04-04 DIAGNOSIS — C3492 Malignant neoplasm of unspecified part of left bronchus or lung: Secondary | ICD-10-CM | POA: Diagnosis not present

## 2021-04-04 DIAGNOSIS — F419 Anxiety disorder, unspecified: Secondary | ICD-10-CM

## 2021-04-04 DIAGNOSIS — I130 Hypertensive heart and chronic kidney disease with heart failure and stage 1 through stage 4 chronic kidney disease, or unspecified chronic kidney disease: Secondary | ICD-10-CM | POA: Diagnosis not present

## 2021-04-04 DIAGNOSIS — N183 Chronic kidney disease, stage 3 unspecified: Secondary | ICD-10-CM | POA: Diagnosis not present

## 2021-04-04 DIAGNOSIS — E1122 Type 2 diabetes mellitus with diabetic chronic kidney disease: Secondary | ICD-10-CM | POA: Diagnosis not present

## 2021-04-04 NOTE — Assessment & Plan Note (Signed)
Chronic bronchitis, takes Prednisone $RemoveBeforeDE'10mg'[id number]$  qd, Neb qid,

## 2021-04-04 NOTE — Assessment & Plan Note (Signed)
takes Sertraline $RemoveBeforeDE'100mg'[id number]$  qd. Lorazepam 0.$RemoveBeforeD'5mg'[id number]$  bid.

## 2021-04-04 NOTE — Assessment & Plan Note (Signed)
off statin

## 2021-04-04 NOTE — Assessment & Plan Note (Signed)
Lung mass, SOB, s/p  radiation tx, refused biopsy, Oncology/Pulmonology no further workups,  left upper lobe mass CT angio chest 01/17/21-02/18/21 Left lung cancer with decreasing soft tissue mass when compared to CT 1 month ago. The left upper lobe remains collapsed/obstructed.. C/o left upper chest/shoulder region pain, 7-8/10, Tylenol is not adequate for pain control, HPOA dtr desires Norco prn, stated the patient is sensitive to codeine but no true allergy.  Will have prn Norco 5/$RemoveBef'325mg'FdmzXsBuwQ$  1/2 tab q6h available to her for pain control.

## 2021-04-04 NOTE — Assessment & Plan Note (Signed)
CPAP, O2 via [place]

## 2021-04-04 NOTE — Assessment & Plan Note (Signed)
Hx of chronic edema BLE, takes Metolazone, Spironolactone daily, BNP 59.5 01/17/21. Bun/creat 20/0.98 eGFR 56 02/18/21

## 2021-04-04 NOTE — Assessment & Plan Note (Signed)
takes Synthroid, TSH 2.21 02/19/21

## 2021-04-04 NOTE — Progress Notes (Addendum)
Location:   SNF [place] Room Number: 35 Place of Service:  SNF (31) Provider: [facility] Haleemah Buckalew NP  Virgie Dad, MD  Patient Care Team: Virgie Dad, MD as PCP - General (Internal Medicine) Minus Breeding, MD as PCP - Cardiology (Cardiology) Azucena Fallen, MD as Consulting Physician (Obstetrics and Gynecology) Chesley Mires, MD as Consulting Physician (Pulmonary Disease) Surah Pelley X, NP as Nurse Practitioner (Internal Medicine) Christin Fudge, MD (Inactive) as Consulting Physician (Surgery)  Extended Emergency Contact Information Primary Emergency Contact: Boxman,Lee Address: [address]          [place], [place] [address] Johnnette Litter of [place] Phone: [phone] Relation: Daughter Secondary Emergency Contact: Ritta Slot [place] of [place] Mobile Phone: [phone] Relation: Daughter  Code Status:  DNR Goals of care: Advanced Directive information Advanced Directives 03/16/2021  Does Patient Have a Medical Advance Directive? Yes  Type of Paramedic of [place];Living will;Out of facility DNR (pink MOST or yellow form)  Does patient want to make changes to medical advance directive? No - Patient declined  Copy of [place] in Chart? Yes - validated most recent copy scanned in chart (See row information)  Would patient like information on creating a medical advance directive? -  Pre-existing out of facility DNR order (yellow form or pink MOST form) -     Chief Complaint  Patient presents with   Acute Visit   Medical Management of Chronic Issues    HPI:  Pt is a 87 y.o. female seen today for medical management of chronic diseases.     Lung mass, SOB, s/p  radiation tx, refused biopsy, Oncology/Pulmonology no further workups,  left upper lobe mass CT angio chest 01/17/21-02/18/21 Left lung cancer with decreasing soft tissue mass when compared to CT 1 month ago. The left upper lobe remains  collapsed/obstructed.. C/o left upper chest/shoulder region pain, 7-8/10, Tylenol is not adequate for pain control, HPOA dtr desires Norco prn, stated the patient is sensitive to codeine but no true allergy.   Chronic bronchitis, takes Prednisone 75m qd, Neb qid,              Hx of chronic edema BLE, takes Metolazone, Spironolactone daily, BNP 59.5 01/17/21. Bun/creat 20/0.98 eGFR 56 02/18/21             GERD, takes Omeprazole, Hgb 12.6 02/18/21             Hyperlipidemia, off statin             Hypothyroidism, takes Synthroid, TSH 2.21 02/19/21             OSA, CPAP, O2 via [place]             Visual hallucination, takes Quetiapine 12.555mqd, MRI showed atrophy             Depression/anxiety, takes Sertraline 10018md. Lorazepam 0.5mg66md.              CAD ASA 81mg63m               Vit B12, supplemented with Vit B12             Dementia, high functional, off Memantine              Prediabetes, Hgb a1c. 6.1 11/25/20             HTN, takes Spironolactone, Metolazone.   Past Medical History:  Diagnosis Date   Altered mental status  ARTHRITIS, KNEES, BILATERAL    Breast CA ([place]) 2000   s/p R lumpectomy and XRT   CHF (congestive heart failure) (HCC)    DEPRESSION    DJD (degenerative joint disease) of knee    GERD    HYPERLIPIDEMIA    HYPERTENSION    HYPOTHYROIDISM    postsurgical   Hypoxia    MIGRAINE HEADACHE    Morbid obesity (HCC)    OSA (obstructive sleep apnea) 01/17/2011 dx   Oxygen dependent    Urge incontinence    UTI (lower urinary tract infection) 12/2014   Past Surgical History:  Procedure Laterality Date   ABDOMINAL HYSTERECTOMY     ADENOIDECTOMY     BREAST BIOPSY  2000   BREAST LUMPECTOMY  2000   CATARACT EXTRACTION     CHOLECYSTECTOMY  1995   CYSTOSCOPY/URETEROSCOPY/HOLMIUM LASER Right 06/28/2015   Procedure: CYSTOSCOPY RIGHT RETROGRAD RIGHT URETEROSCOPY/HOLMIUM LASER WITH RIGHT STENT PLACEMENT;  Surgeon: Alexis Frock, MD;  Location: [facility];  Service: Urology;  Laterality:  Right;   PARATHYROIDECTOMY     2-3 removed   REPLACEMENT TOTAL KNEE BILATERAL  1999, 2005   Right leg femur repaired     THYROIDECTOMY     TONSILLECTOMY     TUBAL LIGATION      Allergies  Allergen Reactions   Atorvastatin Nausea Only   Azithromycin Itching   Beta Adrenergic Blockers Other (See Comments)    Depression    Ciprofloxacin Other (See Comments)    Edgy and very jumpy     Codeine Nausea And Vomiting   Darvon [Propoxyphene] Nausea And Vomiting   Epinephrine Other (See Comments)    Rapid pulse, sweats   Klonopin [Clonazepam] Other (See Comments)    Makes her feel very suicidal    Oxycodone Other (See Comments)    Patient felt like it altered her mental status "Crazy" . Hallucinations later as well.   Paxil [Paroxetine Hydrochloride] Other (See Comments)    Severe depression    Prednisone Other (See Comments)    "makes me feel really bad"   Propoxyphene Hcl Nausea And Vomiting   Tape Itching   Torsemide Other (See Comments)    Patient stated that she doesn't recall the reaction   Tramadol Other (See Comments)    Feels "jittery"   Vicodin [Hydrocodone-Acetaminophen] Other (See Comments)    Hallucinations   Meloxicam Diarrhea   Other Rash    Polyester sheets "cause me a rash"   Vancomycin Rash    Localized rash related to infusion rate    Allergies as of 04/04/2021       Reactions   Atorvastatin Nausea Only   Azithromycin Itching   Beta Adrenergic Blockers Other (See Comments)   Depression   Ciprofloxacin Other (See Comments)   Edgy and very jumpy    Codeine Nausea And Vomiting   Darvon [propoxyphene] Nausea And Vomiting   Epinephrine Other (See Comments)   Rapid pulse, sweats   Klonopin [clonazepam] Other (See Comments)   Makes her feel very suicidal    Oxycodone Other (See Comments)   Patient felt like it altered her mental status "Crazy" . Hallucinations later as well.   Paxil [paroxetine Hydrochloride] Other (See Comments)   Severe depression     Prednisone Other (See Comments)   "makes me feel really bad"   Propoxyphene Hcl Nausea And Vomiting   Tape Itching   Torsemide Other (See Comments)   Patient stated that she doesn't recall the reaction   Tramadol Other (  See Comments)   Feels "jittery"   Vicodin [hydrocodone-acetaminophen] Other (See Comments)   Hallucinations   Meloxicam Diarrhea   Other Rash   Polyester sheets "cause me a rash"   Vancomycin Rash   Localized rash related to infusion rate        Medication List        Accurate as of April 04, 2021 11:59 PM. If you have any questions, ask your nurse or doctor.          acetaminophen 500 MG tablet Commonly known as: TYLENOL Take 500 mg by mouth 2 (two) times daily.   aspirin 81 MG tablet Take 81 mg by mouth daily.   Calcium Polycarbophil 625 MG Chew Chew 1 tablet by mouth 2 (two) times daily.   calcium-vitamin D 500-200 MG-UNIT tablet Commonly known as: OSCAL WITH D Take 1 tablet by mouth 3 (three) times daily as needed.   Citracal Petites/Vitamin D 200-6.25 MG-MCG Tabs Generic drug: Calcium Citrate-Vitamin D Take 1 tablet by mouth every morning.   cyanocobalamin 2000 MCG tablet Take 2,000 mcg by mouth daily.   ipratropium-albuterol 0.5-2.5 (3) MG/3ML Soln Commonly known as: DUONEB Take 3 mLs by nebulization in the morning, at noon, in the evening, and at bedtime.   LORazepam 0.5 MG tablet Commonly known as: ATIVAN Take 0.5 mg by mouth 2 (two) times daily.   melatonin 5 MG Tabs Take 1 tablet (5 mg total) by mouth at bedtime.   metolazone 2.5 MG tablet Commonly known as: ZAROXOLYN TAKE 1 TABLET FIVE DAYS A WEEK, TAKE 2 TABLETS ON MONDAY AND FRIDAY   nystatin powder Commonly known as: MYCOSTATIN/NYSTOP Apply 1 application topically as needed.   omeprazole 20 MG capsule Commonly known as: PRILOSEC TAKE 1 CAPSULE TWICE DAILY BEFORE MEALS   OXYGEN Inhale 4 L into the lungs continuous. KEEP O2 SATS >= 90%. Every Shift    potassium chloride 10 MEQ tablet Commonly known as: KLOR-CON Take 20 mEq by mouth 2 (two) times daily.   predniSONE 20 MG tablet Commonly known as: DELTASONE Take 20 mg by mouth daily.   QUEtiapine 25 MG tablet Commonly known as: SEROQUEL TAKE 1/2 TABLET AT BEDTIME.   saccharomyces boulardii 250 MG capsule Commonly known as: FLORASTOR Take 1 capsule (250 mg total) by mouth daily.   sertraline 100 MG tablet Commonly known as: ZOLOFT TAKE ONE TABLET BY MOUTH AT BEDTIME   spironolactone 25 MG tablet Commonly known as: ALDACTONE TAKE 1 TABLET ONCE DAILY.   Synthroid 137 MCG tablet Generic drug: levothyroxine TAKE 1 TABLET ONCE DAILY BEFORE BREAKFAST.        Review of Systems  Constitutional:  Positive for fatigue. Negative for appetite change and fever.  HENT:  Positive for hearing loss. Negative for congestion and voice change.   Eyes:  Negative for visual disturbance.  Respiratory:  Positive for cough, shortness of breath and wheezing. Negative for choking.        Chronic hacking cough. DOE  Cardiovascular:  Positive for chest pain and leg swelling. Negative for palpitations.  Gastrointestinal:  Negative for abdominal pain and constipation.       Chronic, occasional diarrhea  Endocrine:       Get cold easily  Genitourinary:  Negative for dysuria and urgency.  Musculoskeletal:  Positive for arthralgias and gait problem.       Pain in the tip of the left shoulder, worsens with pulling, pushing, or overhead movement.   Skin:  Negative for color change.  Neurological:  Negative for speech difficulty, weakness and headaches.  Psychiatric/Behavioral:  Negative for confusion, hallucinations and sleep disturbance. The patient is not nervous/anxious.    Immunization History  Administered Date(s) Administered   Influenza Split 03/20/2011   Influenza Whole 03/19/2009, 03/19/2012   Influenza, High Dose Seasonal PF 03/16/2018, 04/20/2019, 03/31/2020   Influenza,inj,Quad  PF,6+ Mos 03/19/2013   Influenza-Unspecified 03/03/2014, 03/15/2015, 03/30/2016, 04/05/2017   Meningococcal Polysaccharide 03/15/2015   Moderna Sars-Covid-2 Vaccination 06/22/2018, 07/21/2019, 04/27/2020, 11/16/2020   PPD Test 03/30/2016   Pneumococcal Conjugate-13 03/12/2014   Pneumococcal Polysaccharide-23 06/19/2006, 03/15/2015   Td 10/04/2009   Tdap 06/19/2005, 11/14/2018   Zoster Recombinat (Shingrix) 01/28/2018, 04/22/2018, 05/22/2018   Zoster, Live 06/19/2010   Pertinent  Health Maintenance Due  Topic Date Due   URINE MICROALBUMIN  Never done   FOOT EXAM  06/19/2020   OPHTHALMOLOGY EXAM  07/20/2020   INFLUENZA VACCINE  01/17/2021   HEMOGLOBIN A1C  05/27/2021   DEXA SCAN  Discontinued   Fall Risk  02/16/2021 08/06/2020 08/05/2020 05/06/2020 02/06/2020  Falls in the past year? 0 0 0 0 0  Number falls in past yr: 0 0 0 0 0  Injury with Fall? - - 0 - -  Risk for fall due to : - - - - -  Follow up Falls evaluation completed - - - -   Functional Status Survey:    Vitals:   04/04/21 1138  BP: (!) 142/85  Pulse: 78  Resp: (!) 22  Temp: (!) 97.5 F (36.4 C)  SpO2: 93%   There is no height or weight on file to calculate BMI. Physical Exam Vitals and nursing note reviewed.  Constitutional:      Comments: sleepy  HENT:     Head: Normocephalic and atraumatic.     Mouth/Throat:     Mouth: Mucous membranes are moist.  Eyes:     Extraocular Movements: Extraocular movements intact.     Conjunctiva/sclera: Conjunctivae normal.     Pupils: Pupils are equal, round, and reactive to light.  Cardiovascular:     Rate and Rhythm: Normal rate and regular rhythm.     Heart sounds: No murmur heard. Pulmonary:     Breath sounds: Wheezing and rales present. No rhonchi.     Comments: Uses O2. C/o pain 7-8/10 left upper chest/shoulder region.  Abdominal:     General: Bowel sounds are normal.     Palpations: Abdomen is soft.     Tenderness: There is no abdominal tenderness.   Genitourinary:    Comments: Small external hemorrhoids 5-6pm previously.  Musculoskeletal:     Cervical back: Normal range of motion and neck supple.     Right lower leg: Edema present.     Left lower leg: Edema present.     Comments: Trace edema BLE.   Skin:    General: Skin is warm and dry.     Comments: Brownish venous insufficiency skin changes BLE  Neurological:     General: No focal deficit present.     Mental Status: She is alert and oriented to person, place, and time. Mental status is at baseline.     Gait: Gait abnormal.  Psychiatric:        Mood and Affect: Mood normal.        Behavior: Behavior normal.        Thought Content: Thought content normal.    Labs reviewed: Recent Labs    01/06/21 0524 01/17/21 0737 02/18/21 0539  NA 140 136 140  K  3.7 3.7 3.5  CL 94* 95* 96*  CO2 35* 33* 32  GLUCOSE 89 102* 115*  BUN '17 14 20  ' CREATININE 0.70 0.90 0.98  CALCIUM 8.9 8.9 8.7*   Recent Labs    11/25/20 0740 01/06/21 0524 02/18/21 0539  AST 12 12* 16  ALT '7 11 14  ' ALKPHOS  --  69 67  BILITOT 0.4 0.7 0.5  PROT 6.2 6.1* 6.4*  ALBUMIN  --  3.0* 3.3*   Recent Labs    11/25/20 0740 01/01/21 0157 01/05/21 0406 01/06/21 0524 01/09/21 0600 01/17/21 0737 02/18/21 0539  WBC 6.7 8.0 7.3   < > 6.5 8.4 8.8  NEUTROABS 4,456 5.7 5.3  --   --   --   --   HGB 13.3 13.9 12.9   < > 13.8 15.0 12.6  HCT 41.2 44.7 41.5   < > 44.3 46.8* 40.2  MCV 87.7 89.8 91.6   < > 89.3 89.1 91.0  PLT 116* 129* 118*   < > 140* PLATELET CLUMPS NOTED ON SMEAR, UNABLE TO ESTIMATE 114*   < > = values in this interval not displayed.   Lab Results  Component Value Date   TSH 2.21 02/19/2021   Lab Results  Component Value Date   HGBA1C 6.1 (H) 11/25/2020   Lab Results  Component Value Date   CHOL 145 04/01/2020   HDL 56 04/01/2020   LDLCALC 71 04/01/2020   LDLDIRECT 99.4 03/13/2012   TRIG 95 04/01/2020   CHOLHDL 2.6 04/01/2020    Significant Diagnostic Results in last 30  days:  No results found.  Assessment/Plan  Mass of upper lobe of left lung Lung mass, SOB, s/p  radiation tx, refused biopsy, Oncology/Pulmonology no further workups,  left upper lobe mass CT angio chest 01/17/21-02/18/21 Left lung cancer with decreasing soft tissue mass when compared to CT 1 month ago. The left upper lobe remains collapsed/obstructed.. C/o left upper chest/shoulder region pain, 7-8/10, Tylenol is not adequate for pain control, HPOA dtr desires Norco prn, stated the patient is sensitive to codeine but no true allergy.  Will have prn Norco 5/323m 1/2 tab q6h available to her for pain control.   Cough Chronic bronchitis, takes Prednisone 166mqd, Neb qid,   Chronic diastolic CHF (congestive heart failure) (HCC) Hx of chronic edema BLE, takes Metolazone, Spironolactone daily, BNP 59.5 01/17/21. Bun/creat 20/0.98 eGFR 56 02/18/21  GERD (gastroesophageal reflux disease) takes Omeprazole, Hgb 12.6 02/18/21  Hyperlipidemia LDL goal <70  off statin  Hypothyroidism (acquired)  takes Synthroid, TSH 2.21 02/19/21  OSA (obstructive sleep apnea) CPAP, O2 via [place]  Hallucination, visual  takes Quetiapine 12.58m73md, MRI showed atrophy  Depression with anxiety takes Sertraline 100m78m. Lorazepam 0.58mg 67m.   Vascular dementia (HCC) [place] functional, off Memantine   Prediabetes  Hgb a1c. 6.1 11/25/20  Essential hypertension Blood pressure is controlled, takes Spironolactone, Metolazone.    Family/ staff Communication: plan of care reviewed with the patient, the patient's HPOA dtr, and charge nurse.   Labs/tests ordered: none  Time spend 25 minutes.

## 2021-04-04 NOTE — Assessment & Plan Note (Signed)
high functional, off Memantine

## 2021-04-04 NOTE — Assessment & Plan Note (Signed)
takes Quetiapine 12.5mg  qd, MRI showed atrophy

## 2021-04-04 NOTE — Assessment & Plan Note (Signed)
Blood pressure is controlled, takes Spironolactone, Metolazone.

## 2021-04-04 NOTE — Assessment & Plan Note (Signed)
takes Omeprazole, Hgb 12.6 02/18/21

## 2021-04-04 NOTE — Assessment & Plan Note (Signed)
Hgb a1c. 6.1 11/25/20

## 2021-04-05 DIAGNOSIS — N183 Chronic kidney disease, stage 3 unspecified: Secondary | ICD-10-CM | POA: Diagnosis not present

## 2021-04-05 DIAGNOSIS — C3492 Malignant neoplasm of unspecified part of left bronchus or lung: Secondary | ICD-10-CM | POA: Diagnosis not present

## 2021-04-05 DIAGNOSIS — I5032 Chronic diastolic (congestive) heart failure: Secondary | ICD-10-CM | POA: Diagnosis not present

## 2021-04-05 DIAGNOSIS — I130 Hypertensive heart and chronic kidney disease with heart failure and stage 1 through stage 4 chronic kidney disease, or unspecified chronic kidney disease: Secondary | ICD-10-CM | POA: Diagnosis not present

## 2021-04-05 DIAGNOSIS — E1122 Type 2 diabetes mellitus with diabetic chronic kidney disease: Secondary | ICD-10-CM | POA: Diagnosis not present

## 2021-04-05 DIAGNOSIS — F0150 Vascular dementia without behavioral disturbance: Secondary | ICD-10-CM | POA: Diagnosis not present

## 2021-04-08 NOTE — Assessment & Plan Note (Signed)
Not sleeping well at night, will try Trazodone $RemoveBefor'25mg'[id number]$  hs prn.

## 2021-04-12 DIAGNOSIS — F0150 Vascular dementia without behavioral disturbance: Secondary | ICD-10-CM | POA: Diagnosis not present

## 2021-04-12 DIAGNOSIS — C3492 Malignant neoplasm of unspecified part of left bronchus or lung: Secondary | ICD-10-CM | POA: Diagnosis not present

## 2021-04-12 DIAGNOSIS — I5032 Chronic diastolic (congestive) heart failure: Secondary | ICD-10-CM | POA: Diagnosis not present

## 2021-04-12 DIAGNOSIS — N183 Chronic kidney disease, stage 3 unspecified: Secondary | ICD-10-CM | POA: Diagnosis not present

## 2021-04-12 DIAGNOSIS — I130 Hypertensive heart and chronic kidney disease with heart failure and stage 1 through stage 4 chronic kidney disease, or unspecified chronic kidney disease: Secondary | ICD-10-CM | POA: Diagnosis not present

## 2021-04-12 DIAGNOSIS — E1122 Type 2 diabetes mellitus with diabetic chronic kidney disease: Secondary | ICD-10-CM | POA: Diagnosis not present

## 2021-04-18 DIAGNOSIS — I5032 Chronic diastolic (congestive) heart failure: Secondary | ICD-10-CM | POA: Diagnosis not present

## 2021-04-18 DIAGNOSIS — C3492 Malignant neoplasm of unspecified part of left bronchus or lung: Secondary | ICD-10-CM | POA: Diagnosis not present

## 2021-04-18 DIAGNOSIS — E1122 Type 2 diabetes mellitus with diabetic chronic kidney disease: Secondary | ICD-10-CM | POA: Diagnosis not present

## 2021-04-18 DIAGNOSIS — F0150 Vascular dementia without behavioral disturbance: Secondary | ICD-10-CM | POA: Diagnosis not present

## 2021-04-18 DIAGNOSIS — I130 Hypertensive heart and chronic kidney disease with heart failure and stage 1 through stage 4 chronic kidney disease, or unspecified chronic kidney disease: Secondary | ICD-10-CM | POA: Diagnosis not present

## 2021-04-18 DIAGNOSIS — N183 Chronic kidney disease, stage 3 unspecified: Secondary | ICD-10-CM | POA: Diagnosis not present

## 2021-04-19 DIAGNOSIS — I130 Hypertensive heart and chronic kidney disease with heart failure and stage 1 through stage 4 chronic kidney disease, or unspecified chronic kidney disease: Secondary | ICD-10-CM | POA: Diagnosis not present

## 2021-04-19 DIAGNOSIS — C3492 Malignant neoplasm of unspecified part of left bronchus or lung: Secondary | ICD-10-CM | POA: Diagnosis not present

## 2021-04-19 DIAGNOSIS — J302 Other seasonal allergic rhinitis: Secondary | ICD-10-CM | POA: Diagnosis not present

## 2021-04-19 DIAGNOSIS — Z87891 Personal history of nicotine dependence: Secondary | ICD-10-CM | POA: Diagnosis not present

## 2021-04-19 DIAGNOSIS — K589 Irritable bowel syndrome without diarrhea: Secondary | ICD-10-CM | POA: Diagnosis not present

## 2021-04-19 DIAGNOSIS — F0150 Vascular dementia without behavioral disturbance: Secondary | ICD-10-CM | POA: Diagnosis not present

## 2021-04-19 DIAGNOSIS — F419 Anxiety disorder, unspecified: Secondary | ICD-10-CM | POA: Diagnosis not present

## 2021-04-19 DIAGNOSIS — K219 Gastro-esophageal reflux disease without esophagitis: Secondary | ICD-10-CM | POA: Diagnosis not present

## 2021-04-19 DIAGNOSIS — F32A Depression, unspecified: Secondary | ICD-10-CM | POA: Diagnosis not present

## 2021-04-19 DIAGNOSIS — I5032 Chronic diastolic (congestive) heart failure: Secondary | ICD-10-CM | POA: Diagnosis not present

## 2021-04-19 DIAGNOSIS — N183 Chronic kidney disease, stage 3 unspecified: Secondary | ICD-10-CM | POA: Diagnosis not present

## 2021-04-19 DIAGNOSIS — E785 Hyperlipidemia, unspecified: Secondary | ICD-10-CM | POA: Diagnosis not present

## 2021-04-19 DIAGNOSIS — E039 Hypothyroidism, unspecified: Secondary | ICD-10-CM | POA: Diagnosis not present

## 2021-04-19 DIAGNOSIS — E1122 Type 2 diabetes mellitus with diabetic chronic kidney disease: Secondary | ICD-10-CM | POA: Diagnosis not present

## 2021-04-19 DIAGNOSIS — Z6838 Body mass index (BMI) 38.0-38.9, adult: Secondary | ICD-10-CM | POA: Diagnosis not present

## 2021-04-19 DIAGNOSIS — Z853 Personal history of malignant neoplasm of breast: Secondary | ICD-10-CM | POA: Diagnosis not present

## 2021-04-20 ENCOUNTER — Other Ambulatory Visit: Payer: Self-pay

## 2021-04-20 ENCOUNTER — Encounter: Payer: No Typology Code available for payment source | Admitting: Family Medicine

## 2021-04-25 DIAGNOSIS — I130 Hypertensive heart and chronic kidney disease with heart failure and stage 1 through stage 4 chronic kidney disease, or unspecified chronic kidney disease: Secondary | ICD-10-CM | POA: Diagnosis not present

## 2021-04-25 DIAGNOSIS — F0150 Vascular dementia without behavioral disturbance: Secondary | ICD-10-CM | POA: Diagnosis not present

## 2021-04-25 DIAGNOSIS — C3492 Malignant neoplasm of unspecified part of left bronchus or lung: Secondary | ICD-10-CM | POA: Diagnosis not present

## 2021-04-25 DIAGNOSIS — N183 Chronic kidney disease, stage 3 unspecified: Secondary | ICD-10-CM | POA: Diagnosis not present

## 2021-04-25 DIAGNOSIS — E1122 Type 2 diabetes mellitus with diabetic chronic kidney disease: Secondary | ICD-10-CM | POA: Diagnosis not present

## 2021-04-25 DIAGNOSIS — I5032 Chronic diastolic (congestive) heart failure: Secondary | ICD-10-CM | POA: Diagnosis not present

## 2021-04-26 ENCOUNTER — Non-Acute Institutional Stay (SKILLED_NURSING_FACILITY): Payer: Medicare Other | Admitting: Internal Medicine

## 2021-04-26 ENCOUNTER — Encounter: Payer: Self-pay | Admitting: Internal Medicine

## 2021-04-26 DIAGNOSIS — R918 Other nonspecific abnormal finding of lung field: Secondary | ICD-10-CM | POA: Diagnosis not present

## 2021-04-26 DIAGNOSIS — G4733 Obstructive sleep apnea (adult) (pediatric): Secondary | ICD-10-CM

## 2021-04-26 DIAGNOSIS — R053 Chronic cough: Secondary | ICD-10-CM | POA: Diagnosis not present

## 2021-04-26 DIAGNOSIS — E039 Hypothyroidism, unspecified: Secondary | ICD-10-CM

## 2021-04-26 DIAGNOSIS — F418 Other specified anxiety disorders: Secondary | ICD-10-CM

## 2021-04-26 DIAGNOSIS — K219 Gastro-esophageal reflux disease without esophagitis: Secondary | ICD-10-CM

## 2021-04-26 DIAGNOSIS — I5032 Chronic diastolic (congestive) heart failure: Secondary | ICD-10-CM | POA: Diagnosis not present

## 2021-04-26 DIAGNOSIS — R441 Visual hallucinations: Secondary | ICD-10-CM

## 2021-04-26 NOTE — Progress Notes (Signed)
Location:   [facility] Room Number: 35 Place of Service:  SNF [phone]) Provider:  Einar Crow MD  Mahlon Gammon, MD  Patient Care Team: Mahlon Gammon, MD as PCP - General (Internal Medicine) Rollene Rotunda, MD as PCP - Cardiology (Cardiology) Shea Evans, MD as Consulting Physician (Obstetrics and Gynecology) Coralyn Helling, MD as Consulting Physician (Pulmonary Disease) Mast, Man X, NP as Nurse Practitioner (Internal Medicine) Evlyn Kanner, MD (Inactive) as Consulting Physician (Surgery)  Extended Emergency Contact Information Primary Emergency Contact: Boxman,Lee Address: [address]          [place], [place] [address] [place] of [place] Home Phone: [phone] Relation: Daughter Secondary Emergency Contact: Theodis Shove [place] of [place] Mobile Phone: [phone] Relation: Daughter  Code Status:  DNR Hospice Goals of care: Advanced Directive information Advanced Directives 04/26/2021  Does Patient Have a Medical Advance Directive? Yes  Type of Estate agent of [place];Living will;Out of facility DNR (pink MOST or yellow form)  Does patient want to make changes to medical advance directive? No - Patient declined  Copy of Healthcare Power of Attorney in Chart? Yes - validated most recent copy scanned in chart (See row information)  Would patient like information on creating a medical advance directive? -  Pre-existing out of facility DNR order (yellow form or pink MOST form) Yellow form placed in chart (order not valid for inpatient use);Pink MOST form placed in chart (order not valid for inpatient use)     Chief Complaint  Patient presents with   Medical Management of Chronic Issues   Quality Metric Gaps    Urine microalbumin, eye and foot exam    HPI:  Pt is a 87 y.o. female seen today for medical management of chronic diseases.    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 Also keeping on Chronic Prednisone to help with this Does not want to take Roxanol or Ativan   Past Medical History:  Diagnosis Date   Altered mental status    ARTHRITIS, KNEES, BILATERAL    Breast CA (HCC) 2000   s/p R lumpectomy and XRT   CHF (congestive heart failure) (HCC)    DEPRESSION    DJD (degenerative joint disease) of knee    GERD    HYPERLIPIDEMIA    HYPERTENSION    HYPOTHYROIDISM    postsurgical   Hypoxia    MIGRAINE HEADACHE    Morbid obesity (HCC)    OSA (obstructive sleep apnea) 01/17/2011 dx   Oxygen dependent    Urge incontinence    UTI (lower urinary tract infection) 12/2014   Past Surgical History:  Procedure Laterality Date   ABDOMINAL HYSTERECTOMY     ADENOIDECTOMY     BREAST BIOPSY  2000   BREAST LUMPECTOMY  2000   CATARACT EXTRACTION     CHOLECYSTECTOMY  1995   CYSTOSCOPY/URETEROSCOPY/HOLMIUM LASER Right 06/28/2015   Procedure: CYSTOSCOPY RIGHT RETROGRAD RIGHT URETEROSCOPY/HOLMIUM LASER WITH RIGHT STENT PLACEMENT;  Surgeon: Sebastian Ache, MD;  Location: [facility];  Service: Urology;  Laterality: Right;   PARATHYROIDECTOMY     2-3 removed   REPLACEMENT TOTAL KNEE BILATERAL  1999, 2005   Right leg femur repaired     THYROIDECTOMY     TONSILLECTOMY     TUBAL LIGATION      Allergies  Allergen Reactions   Atorvastatin Nausea Only   Azithromycin Itching   Beta Adrenergic Blockers Other (See Comments)    Depression    Ciprofloxacin Other (See Comments)    Edgy and very  jumpy     Codeine Nausea And Vomiting   Darvon [Propoxyphene] Nausea And Vomiting   Epinephrine Other (See Comments)    Rapid pulse, sweats   Klonopin [Clonazepam] Other (See Comments)    Makes her feel very suicidal    Oxycodone Other (See Comments)    Patient felt like it altered her mental status "Crazy" . Hallucinations later as well.   Paxil [Paroxetine Hydrochloride] Other (See Comments)    Severe depression    Prednisone Other (See Comments)    "makes me feel really bad"   Propoxyphene Hcl Nausea And Vomiting   Tape Itching   Torsemide Other (See Comments)    Patient stated that she doesn't recall the reaction   Tramadol Other (See Comments)    Feels "jittery"   Vicodin [Hydrocodone-Acetaminophen] Other (See Comments)    Hallucinations   Meloxicam Diarrhea   Other Rash    Polyester sheets "cause me a rash"   Vancomycin Rash    Localized rash related to infusion rate    Allergies as of 04/26/2021       Reactions   Atorvastatin Nausea Only   Azithromycin Itching   Beta Adrenergic Blockers Other (See Comments)   Depression   Ciprofloxacin Other (See Comments)   Edgy and very jumpy    Codeine Nausea And Vomiting   Darvon [propoxyphene] Nausea And Vomiting   Epinephrine Other (See Comments)   Rapid pulse, sweats   Klonopin [clonazepam] Other (See Comments)   Makes her feel very suicidal    Oxycodone Other (See Comments)   Patient felt like it altered her mental status "Crazy" . Hallucinations later as well.   Paxil [paroxetine Hydrochloride] Other (See Comments)   Severe depression    Prednisone Other (See Comments)   "makes me feel really bad"   Propoxyphene Hcl Nausea And Vomiting   Tape Itching   Torsemide Other (See Comments)   Patient stated that she doesn't recall the reaction   Tramadol Other (See Comments)   Feels "jittery"   Vicodin [hydrocodone-acetaminophen] Other (See Comments)   Hallucinations   Meloxicam Diarrhea   Other Rash   Polyester  sheets "cause me a rash"   Vancomycin Rash   Localized rash related to infusion rate        Medication List        Accurate as of April 26, 2021 12:41 PM. If you have any questions, ask your nurse or doctor.          acetaminophen 500 MG tablet Commonly known as: TYLENOL Take 500 mg by mouth 2 (two) times daily.   aspirin 81 MG tablet Take 81 mg by mouth daily.   Calcium Polycarbophil 625 MG Chew Chew 1 tablet by mouth 2 (two) times daily.   calcium-vitamin D 500-200 MG-UNIT tablet Commonly known as: OSCAL WITH D Take 1 tablet by mouth 3 (three) times daily as needed.   Citracal Petites/Vitamin D 200-6.25 MG-MCG Tabs Generic drug:  Calcium Citrate-Vitamin D Take 1 tablet by mouth every morning.   cyanocobalamin 2000 MCG tablet Take 2,000 mcg by mouth daily.   HYDROcodone-acetaminophen 5-325 MG tablet Commonly known as: NORCO/VICODIN Take 0.5 tablets by mouth every 6 (six) hours as needed for moderate pain.   ipratropium-albuterol 0.5-2.5 (3) MG/3ML Soln Commonly known as: DUONEB Take 3 mLs by nebulization in the morning, at noon, in the evening, and at bedtime.   LORazepam 0.5 MG tablet Commonly known as: ATIVAN Take 0.5 mg by mouth 2 (two) times daily.   melatonin 5 MG Tabs Take 1 tablet (5 mg total) by mouth at bedtime.   metolazone 2.5 MG tablet Commonly known as: ZAROXOLYN TAKE 1 TABLET FIVE DAYS A WEEK, TAKE 2 TABLETS ON MONDAY AND FRIDAY   miconazole 2 % cream Commonly known as: MICOTIN Apply 1 application topically as needed.   morphine CONCENTRATE 10 mg / 0.5 ml concentrated solution Take 5 mg by mouth every 6 (six) hours as needed for severe pain. 100 mg/5 mL (20 mg/mL) 0.50ml ($RemoveBefore'5mg'[id number]$ )   nystatin powder Commonly known as: MYCOSTATIN/NYSTOP Apply 1 application topically as needed.   omeprazole 20 MG capsule Commonly known as: PRILOSEC TAKE 1 CAPSULE TWICE DAILY BEFORE MEALS   OXYGEN Inhale 4 L into the lungs continuous. KEEP O2 SATS >=  90%. Every Shift   potassium chloride 10 MEQ tablet Commonly known as: KLOR-CON Take 20 mEq by mouth 2 (two) times daily.   predniSONE 10 MG tablet Commonly known as: DELTASONE Take 10 mg by mouth daily.   QUEtiapine 25 MG tablet Commonly known as: SEROQUEL TAKE 1/2 TABLET AT BEDTIME.   saccharomyces boulardii 250 MG capsule Commonly known as: FLORASTOR Take 1 capsule (250 mg total) by mouth daily.   sertraline 100 MG tablet Commonly known as: ZOLOFT TAKE ONE TABLET BY MOUTH AT BEDTIME   spironolactone 25 MG tablet Commonly known as: ALDACTONE TAKE 1 TABLET ONCE DAILY.   Synthroid 137 MCG tablet Generic drug: levothyroxine TAKE 1 TABLET ONCE DAILY BEFORE BREAKFAST.   traZODone 50 MG tablet Commonly known as: DESYREL Take 25 mg by mouth at bedtime as needed for sleep.        Review of Systems  Constitutional:  Positive for activity change.  HENT: Negative.    Respiratory:  Positive for cough and shortness of breath.   Cardiovascular:  Positive for leg swelling.  Gastrointestinal: Negative.   Genitourinary: Negative.   Musculoskeletal:  Positive for gait problem.  Skin: Negative.   Neurological:  Positive for weakness.  Psychiatric/Behavioral: Negative.     Immunization History  Administered Date(s) Administered   Influenza Split 03/20/2011   Influenza Whole 03/19/2009, 03/19/2012   Influenza, High Dose Seasonal PF 03/16/2018, 04/20/2019, 03/31/2020   Influenza,inj,Quad PF,6+ Mos 03/19/2013   Influenza-Unspecified 03/03/2014, 03/15/2015, 03/30/2016, 04/05/2017, 04/06/2021   Meningococcal Polysaccharide 03/15/2015   Moderna Sars-Covid-2 Vaccination 06/22/2018, 07/21/2019, 04/27/2020, 11/16/2020   PFIZER(Purple Top)SARS-COV-2 Vaccination 03/08/2021   PPD Test 03/30/2016   Pneumococcal Conjugate-13 03/12/2014   Pneumococcal Polysaccharide-23 06/19/2006, 03/15/2015   Td 10/04/2009   Tdap 06/19/2005, 11/14/2018   Zoster Recombinat (Shingrix) 01/28/2018,  04/22/2018, 05/22/2018   Zoster, Live 06/19/2010   Pertinent  Health Maintenance Due  Topic Date Due   URINE MICROALBUMIN  Never done   FOOT EXAM  06/19/2020   OPHTHALMOLOGY EXAM  07/20/2020   HEMOGLOBIN A1C  05/27/2021   INFLUENZA VACCINE  Completed   DEXA SCAN  Discontinued   Fall Risk 01/10/2021 01/17/2021 02/03/2021 02/16/2021 02/18/2021  Falls  in the past year? - - - 0 -  Was there an injury with Fall? - - - - -  Fall Risk Category Calculator - - - - -  Fall Risk Category - - - - -  Patient Fall Risk Level High fall risk Moderate fall risk High fall risk Low fall risk Moderate fall risk  Patient at Risk for Falls Due to - - - - -  Fall risk Follow up - - - Falls evaluation completed -   Functional Status Survey:    Vitals:   04/26/21 1224  BP: 134/73  Pulse: 80  Resp: 18  Temp: (!) 97.5 F (36.4 C)  SpO2: 91%  Weight: 258 lb 8 oz (117.3 kg)  Height: $Remove'5\' 8"'[id number]$  (1.727 m)   Body mass index is 39.3 kg/m. Physical Exam Vitals reviewed.  Constitutional:      Appearance: She is obese.  HENT:     Head: Normocephalic.     Nose: Nose normal.     Mouth/Throat:     Mouth: Mucous membranes are moist.     Pharynx: Oropharynx is clear.  Eyes:     Pupils: Pupils are equal, round, and reactive to light.  Cardiovascular:     Rate and Rhythm: Normal rate and regular rhythm.     Pulses: Normal pulses.  Pulmonary:     Comments: Has Wheezing in her Left Side Abdominal:     General: Abdomen is flat. Bowel sounds are normal.     Palpations: Abdomen is soft.  Musculoskeletal:        General: Swelling present.     Cervical back: Neck supple.  Skin:    General: Skin is warm.  Neurological:     General: No focal deficit present.     Mental Status: She is alert.  Psychiatric:        Mood and Affect: Mood normal.        Thought Content: Thought content normal.    Labs reviewed: Recent Labs    01/06/21 0524 01/17/21 0737 02/18/21 0539  NA 140 136 140  K 3.7 3.7 3.5  CL 94*  95* 96*  CO2 35* 33* 32  GLUCOSE 89 102* 115*  BUN $Re'17 14 20  'AaX$ CREATININE 0.70 0.90 0.98  CALCIUM 8.9 8.9 8.7*   Recent Labs    11/25/20 0740 01/06/21 0524 02/18/21 0539  AST 12 12* 16  ALT $Re'7 11 14  'tBZ$ ALKPHOS  --  69 67  BILITOT 0.4 0.7 0.5  PROT 6.2 6.1* 6.4*  ALBUMIN  --  3.0* 3.3*   Recent Labs    11/25/20 0740 01/01/21 0157 01/05/21 0406 01/06/21 0524 01/09/21 0600 01/17/21 0737 02/18/21 0539  WBC 6.7 8.0 7.3   < > 6.5 8.4 8.8  NEUTROABS 4,456 5.7 5.3  --   --   --   --   HGB 13.3 13.9 12.9   < > 13.8 15.0 12.6  HCT 41.2 44.7 41.5   < > 44.3 46.8* 40.2  MCV 87.7 89.8 91.6   < > 89.3 89.1 91.0  PLT 116* 129* 118*   < > 140* PLATELET CLUMPS NOTED ON SMEAR, UNABLE TO ESTIMATE 114*   < > = values in this interval not displayed.   Lab Results  Component Value Date   TSH 2.21 02/19/2021   Lab Results  Component Value Date   HGBA1C 6.1 (H) 11/25/2020   Lab Results  Component Value Date   CHOL 145 04/01/2020   HDL 56  04/01/2020   LDLCALC 71 04/01/2020   LDLDIRECT 99.4 03/13/2012   TRIG 95 04/01/2020   CHOLHDL 2.6 04/01/2020    Significant Diagnostic Results in last 30 days:  No results found.  Assessment/Plan  Mass of upper lobe of left lung Patient got Radiation treatment Now Enrolled in Hospice Continues to have SOB Wheezing  Continues on Nebs Prednisone and Oxygen Chronic cough Does not want Roxanol or Codeine Will start her on tessalon Pearls  Chronic diastolic CHF (congestive heart failure) (HCC) On Low dose of Metolazone and Aldactone Gastroesophageal reflux disease without esophagitis On Prilosec Hypothyroidism (acquired) TSH normal OSA (obstructive sleep apnea) Uses only Oxygen Hallucination, visual On Low dose of Seroquel Depression with anxiety On Zoloft and Trazodone  Family/ staff Communication:   Labs/tests ordered:

## 2021-04-28 DIAGNOSIS — E1122 Type 2 diabetes mellitus with diabetic chronic kidney disease: Secondary | ICD-10-CM | POA: Diagnosis not present

## 2021-04-28 DIAGNOSIS — N183 Chronic kidney disease, stage 3 unspecified: Secondary | ICD-10-CM | POA: Diagnosis not present

## 2021-04-28 DIAGNOSIS — F0150 Vascular dementia without behavioral disturbance: Secondary | ICD-10-CM | POA: Diagnosis not present

## 2021-04-28 DIAGNOSIS — I5032 Chronic diastolic (congestive) heart failure: Secondary | ICD-10-CM | POA: Diagnosis not present

## 2021-04-28 DIAGNOSIS — I130 Hypertensive heart and chronic kidney disease with heart failure and stage 1 through stage 4 chronic kidney disease, or unspecified chronic kidney disease: Secondary | ICD-10-CM | POA: Diagnosis not present

## 2021-04-28 DIAGNOSIS — C3492 Malignant neoplasm of unspecified part of left bronchus or lung: Secondary | ICD-10-CM | POA: Diagnosis not present

## 2021-05-03 DIAGNOSIS — I130 Hypertensive heart and chronic kidney disease with heart failure and stage 1 through stage 4 chronic kidney disease, or unspecified chronic kidney disease: Secondary | ICD-10-CM | POA: Diagnosis not present

## 2021-05-03 DIAGNOSIS — E1122 Type 2 diabetes mellitus with diabetic chronic kidney disease: Secondary | ICD-10-CM | POA: Diagnosis not present

## 2021-05-03 DIAGNOSIS — N183 Chronic kidney disease, stage 3 unspecified: Secondary | ICD-10-CM | POA: Diagnosis not present

## 2021-05-03 DIAGNOSIS — C3492 Malignant neoplasm of unspecified part of left bronchus or lung: Secondary | ICD-10-CM | POA: Diagnosis not present

## 2021-05-03 DIAGNOSIS — F0150 Vascular dementia without behavioral disturbance: Secondary | ICD-10-CM | POA: Diagnosis not present

## 2021-05-03 DIAGNOSIS — I5032 Chronic diastolic (congestive) heart failure: Secondary | ICD-10-CM | POA: Diagnosis not present

## 2021-05-04 ENCOUNTER — Encounter: Payer: Self-pay | Admitting: Orthopedic Surgery

## 2021-05-04 ENCOUNTER — Non-Acute Institutional Stay (SKILLED_NURSING_FACILITY): Payer: Medicare Other | Admitting: Orthopedic Surgery

## 2021-05-04 DIAGNOSIS — R410 Disorientation, unspecified: Secondary | ICD-10-CM | POA: Diagnosis not present

## 2021-05-04 DIAGNOSIS — R918 Other nonspecific abnormal finding of lung field: Secondary | ICD-10-CM | POA: Diagnosis not present

## 2021-05-04 NOTE — Progress Notes (Deleted)
Location:   [facility] Room Number: 35 A Place of Service:  SNF ([phone]) Provider: Windell Moulding, NP   Virgie Dad, MD  Patient Care Team: Virgie Dad, MD as PCP - General (Internal Medicine) Minus Breeding, MD as PCP - Cardiology (Cardiology) Azucena Fallen, MD as Consulting Physician (Obstetrics and Gynecology) Chesley Mires, MD as Consulting Physician (Pulmonary Disease) Mast, Man X, NP as Nurse Practitioner (Internal Medicine) Christin Fudge, MD (Inactive) as Consulting Physician (Surgery)  Extended Emergency Contact Information Primary Emergency Contact: Boxman,Lee Address: [address]          [place], [place] [address] Johnnette Litter of [place] Phone: [phone] Relation: Daughter Secondary Emergency Contact: Ritta Slot [place] of [place] Mobile Phone: [phone] Relation: Daughter  Code Status: DNR  Goals of care: Advanced Directive information Advanced Directives 05/04/2021  Does Patient Have a Medical Advance Directive? Yes  Type of Paramedic of [place];Living will;Out of facility DNR (pink MOST or yellow form)  Does patient want to make changes to medical advance directive? No - Patient declined  Copy of [place] in Chart? Yes - validated most recent copy scanned in chart (See row information)  Would patient like information on creating a medical advance directive? -  Pre-existing out of facility DNR order (yellow form or pink MOST form) Yellow form placed in chart (order not valid for inpatient use);Pink MOST form placed in chart (order not valid for inpatient use)     Chief Complaint  Patient presents with   Acute Visit    Acute visit for Increased confusion.    HPI:  Pt is a 87 y.o. female seen today for an acute visit for    Past Medical History:  Diagnosis Date   Altered mental status    ARTHRITIS, KNEES, BILATERAL    Breast CA ([facility]) 2000   s/p R lumpectomy and  XRT   CHF (congestive heart failure) (HCC)    DEPRESSION    DJD (degenerative joint disease) of knee    GERD    HYPERLIPIDEMIA    HYPERTENSION    HYPOTHYROIDISM    postsurgical   Hypoxia    MIGRAINE HEADACHE    Morbid obesity ([facility])    OSA (obstructive sleep apnea) 01/17/2011 dx   Oxygen dependent    Urge incontinence    UTI (lower urinary tract infection) 12/2014   Past Surgical History:  Procedure Laterality Date   ABDOMINAL HYSTERECTOMY     ADENOIDECTOMY     BREAST BIOPSY  2000   BREAST LUMPECTOMY  2000   CATARACT EXTRACTION     CHOLECYSTECTOMY  1995   CYSTOSCOPY/URETEROSCOPY/HOLMIUM LASER Right 06/28/2015   Procedure: CYSTOSCOPY RIGHT RETROGRAD RIGHT URETEROSCOPY/HOLMIUM LASER WITH RIGHT STENT PLACEMENT;  Surgeon: Alexis Frock, MD;  Location: [facility];  Service: Urology;  Laterality: Right;   PARATHYROIDECTOMY     2-3 removed   REPLACEMENT TOTAL KNEE BILATERAL  1999, 2005   Right leg femur repaired     THYROIDECTOMY     TONSILLECTOMY     TUBAL LIGATION      Allergies  Allergen Reactions   Atorvastatin Nausea Only   Azithromycin Itching   Beta Adrenergic Blockers Other (See Comments)    Depression    Ciprofloxacin Other (See Comments)    Edgy and very jumpy     Codeine Nausea And Vomiting   Darvon [Propoxyphene] Nausea And Vomiting   Epinephrine Other (See Comments)    Rapid pulse, sweats  Klonopin [Clonazepam] Other (See Comments)    Makes her feel very suicidal    Oxycodone Other (See Comments)    Patient felt like it altered her mental status "Crazy" . Hallucinations later as well.   Paxil [Paroxetine Hydrochloride] Other (See Comments)    Severe depression    Prednisone Other (See Comments)    "makes me feel really bad"   Propoxyphene Hcl Nausea And Vomiting   Tape Itching   Torsemide Other (See Comments)    Patient stated that she doesn't recall the reaction   Tramadol Other (See Comments)    Feels "jittery"   Vicodin [Hydrocodone-Acetaminophen]  Other (See Comments)    Hallucinations   Meloxicam Diarrhea   Other Rash    Polyester sheets "cause me a rash"   Vancomycin Rash    Localized rash related to infusion rate    Allergies as of 05/04/2021       Reactions   Atorvastatin Nausea Only   Azithromycin Itching   Beta Adrenergic Blockers Other (See Comments)   Depression   Ciprofloxacin Other (See Comments)   Edgy and very jumpy    Codeine Nausea And Vomiting   Darvon [propoxyphene] Nausea And Vomiting   Epinephrine Other (See Comments)   Rapid pulse, sweats   Klonopin [clonazepam] Other (See Comments)   Makes her feel very suicidal    Oxycodone Other (See Comments)   Patient felt like it altered her mental status "Crazy" . Hallucinations later as well.   Paxil [paroxetine Hydrochloride] Other (See Comments)   Severe depression    Prednisone Other (See Comments)   "makes me feel really bad"   Propoxyphene Hcl Nausea And Vomiting   Tape Itching   Torsemide Other (See Comments)   Patient stated that she doesn't recall the reaction   Tramadol Other (See Comments)   Feels "jittery"   Vicodin [hydrocodone-acetaminophen] Other (See Comments)   Hallucinations   Meloxicam Diarrhea   Other Rash   Polyester sheets "cause me a rash"   Vancomycin Rash   Localized rash related to infusion rate        Medication List        Accurate as of May 04, 2021  3:38 PM. If you have any questions, ask your nurse or doctor.          STOP taking these medications    Calcium Polycarbophil 625 MG Chew Stopped by: Yvonna Alanis, NP       TAKE these medications    acetaminophen 500 MG tablet Commonly known as: TYLENOL Take 1,000 mg by mouth 2 (two) times daily.   aspirin 81 MG tablet Take 81 mg by mouth daily.   benzonatate 200 MG capsule Commonly known as: TESSALON Take 200 mg by mouth 3 (three) times daily as needed for cough. Special Instructions: administer for cough   calcium-vitamin D 500-200 MG-UNIT  tablet Commonly known as: OSCAL WITH D Take 1 tablet by mouth 3 (three) times daily as needed.   Citracal Petites/Vitamin D 200-6.25 MG-MCG Tabs Generic drug: Calcium Citrate-Vitamin D Take 1 tablet by mouth every morning.   cyanocobalamin 1000 MCG tablet Take 2,000 mcg by mouth daily.   HYDROcodone-acetaminophen 5-325 MG tablet Commonly known as: NORCO/VICODIN Take 0.5 tablets by mouth every 6 (six) hours as needed for moderate pain.   ipratropium-albuterol 0.5-2.5 (3) MG/3ML Soln Commonly known as: DUONEB Take 3 mLs by nebulization in the morning, at noon, in the evening, and at bedtime.   LORazepam 0.5 MG tablet  Commonly known as: ATIVAN Take 0.5 mg by mouth 2 (two) times daily as needed.   melatonin 5 MG Tabs Take 1 tablet (5 mg total) by mouth at bedtime.   metolazone 2.5 MG tablet Commonly known as: ZAROXOLYN Special Instructions: take 1 tablet on Sun/Tues/Wed/Thu/Sat. What changed: Another medication with the same name was removed. Continue taking this medication, and follow the directions you see here. Changed by: Yvonna Alanis, NP   metolazone 2.5 MG tablet Commonly known as: ZAROXOLYN Special Instructions: Take 2 tablet on Mondays and Fridays What changed: Another medication with the same name was removed. Continue taking this medication, and follow the directions you see here. Changed by: Yvonna Alanis, NP   miconazole 2 % cream Commonly known as: MICOTIN Apply 1 application topically 2 (two) times daily as needed. Apply to groin area   morphine CONCENTRATE 10 mg / 0.5 ml concentrated solution Take 5 mg by mouth every 6 (six) hours as needed for severe pain. 100 mg/5 mL (20 mg/mL) 0.30ml ($RemoveBefore'5mg'[id number]$ )   nystatin powder Commonly known as: MYCOSTATIN/NYSTOP Apply 1 application topically as needed.   omeprazole 20 MG capsule Commonly known as: PRILOSEC TAKE 1 CAPSULE TWICE DAILY BEFORE MEALS   OXYGEN Inhale 2 L into the lungs continuous. KEEP O2 SATS >= 90%. Every  Shift   potassium chloride 10 MEQ tablet Commonly known as: KLOR-CON Take 20 mEq by mouth 2 (two) times daily.   predniSONE 10 MG tablet Commonly known as: DELTASONE Take 10 mg by mouth daily.   QUEtiapine 25 MG tablet Commonly known as: SEROQUEL TAKE 1/2 TABLET AT BEDTIME.   QUEtiapine 25 MG tablet Commonly known as: SEROQUEL Take by mouth. Take 1/2 tablet every morning   saccharomyces boulardii 250 MG capsule Commonly known as: FLORASTOR Take 1 capsule (250 mg total) by mouth daily.   sertraline 100 MG tablet Commonly known as: ZOLOFT TAKE ONE TABLET BY MOUTH AT BEDTIME   spironolactone 25 MG tablet Commonly known as: ALDACTONE TAKE 1 TABLET ONCE DAILY.   Synthroid 137 MCG tablet Generic drug: levothyroxine TAKE 1 TABLET ONCE DAILY BEFORE BREAKFAST.   traZODone 50 MG tablet Commonly known as: DESYREL Take 25 mg by mouth at bedtime as needed for sleep.        Review of Systems  Immunization History  Administered Date(s) Administered   Influenza Split 03/20/2011   Influenza Whole 03/19/2009, 03/19/2012   Influenza, High Dose Seasonal PF 03/16/2018, 04/20/2019, 03/31/2020   Influenza,inj,Quad PF,6+ Mos 03/19/2013   Influenza-Unspecified 03/03/2014, 03/15/2015, 03/30/2016, 04/05/2017, 04/06/2021   Meningococcal Polysaccharide 03/15/2015   Moderna Sars-Covid-2 Vaccination 06/23/2019, 07/21/2019, 04/27/2020, 11/16/2020   PFIZER(Purple Top)SARS-COV-2 Vaccination 03/08/2021   PPD Test 03/30/2016   Pneumococcal Conjugate-13 03/12/2014   Pneumococcal Polysaccharide-23 06/19/2006, 03/15/2015   Td 10/04/2009   Tdap 06/19/2005, 11/14/2018   Zoster Recombinat (Shingrix) 01/28/2018, 04/22/2018, 05/22/2018   Zoster, Live 06/19/2010   Pertinent  Health Maintenance Due  Topic Date Due   URINE MICROALBUMIN  Never done   FOOT EXAM  06/19/2020   OPHTHALMOLOGY EXAM  07/20/2020   HEMOGLOBIN A1C  05/27/2021   INFLUENZA VACCINE  Completed   DEXA SCAN  Discontinued    Fall Risk 01/10/2021 01/17/2021 02/03/2021 02/16/2021 02/18/2021  Falls in the past year? - - - 0 -  Was there an injury with Fall? - - - - -  Fall Risk Category Calculator - - - - -  Fall Risk Category - - - - -  Patient Fall Risk Level High  fall risk Moderate fall risk High fall risk Low fall risk Moderate fall risk  Patient at Risk for Falls Due to - - - - -  Fall risk Follow up - - - Falls evaluation completed -   Functional Status Survey:    Vitals:   05/04/21 1447 05/04/21 1535  BP: (!) 171/100 130/74  Pulse: 98   Resp: 18   Temp: 97.9 F (36.6 C)   SpO2: 91%   Weight: 258 lb 8 oz (117.3 kg)   Height: $Remove'5\' 8"'cjVDSgy$  (1.727 m)    Body mass index is 39.3 kg/m. Physical Exam  Labs reviewed: Recent Labs    01/06/21 0524 01/17/21 0737 02/18/21 0539  NA 140 136 140  K 3.7 3.7 3.5  CL 94* 95* 96*  CO2 35* 33* 32  GLUCOSE 89 102* 115*  BUN $Re'17 14 20  'CpM$ CREATININE 0.70 0.90 0.98  CALCIUM 8.9 8.9 8.7*   Recent Labs    11/25/20 0740 01/06/21 0524 02/18/21 0539  AST 12 12* 16  ALT $Re'7 11 14  'WYy$ ALKPHOS  --  69 67  BILITOT 0.4 0.7 0.5  PROT 6.2 6.1* 6.4*  ALBUMIN  --  3.0* 3.3*   Recent Labs    11/25/20 0740 01/01/21 0157 01/05/21 0406 01/06/21 0524 01/09/21 0600 01/17/21 0737 02/18/21 0539  WBC 6.7 8.0 7.3   < > 6.5 8.4 8.8  NEUTROABS 4,456 5.7 5.3  --   --   --   --   HGB 13.3 13.9 12.9   < > 13.8 15.0 12.6  HCT 41.2 44.7 41.5   < > 44.3 46.8* 40.2  MCV 87.7 89.8 91.6   < > 89.3 89.1 91.0  PLT 116* 129* 118*   < > 140* PLATELET CLUMPS NOTED ON SMEAR, UNABLE TO ESTIMATE 114*   < > = values in this interval not displayed.   Lab Results  Component Value Date   TSH 2.21 02/19/2021   Lab Results  Component Value Date   HGBA1C 6.1 (H) 11/25/2020   Lab Results  Component Value Date   CHOL 145 04/01/2020   HDL 56 04/01/2020   LDLCALC 71 04/01/2020   LDLDIRECT 99.4 03/13/2012   TRIG 95 04/01/2020   CHOLHDL 2.6 04/01/2020    Significant Diagnostic Results in last  30 days:  No results found.  Assessment/Plan 1. Confusion ***  2. Mass of upper lobe of left lung ***    Family/ staff Communication:   Labs/tests ordered:

## 2021-05-04 NOTE — Progress Notes (Signed)
Location:  [place] Room Number: 35 A Place of Service:  SNF [phone]) Provider:  Windell Moulding, AGNP-C  Virgie Dad, MD  Patient Care Team: Virgie Dad, MD as PCP - General (Internal Medicine) Minus Breeding, MD as PCP - Cardiology (Cardiology) Azucena Fallen, MD as Consulting Physician (Obstetrics and Gynecology) Chesley Mires, MD as Consulting Physician (Pulmonary Disease) Mast, Man X, NP as Nurse Practitioner (Internal Medicine) Christin Fudge, MD (Inactive) as Consulting Physician (Surgery)  Extended Emergency Contact Information Primary Emergency Contact: Boxman,Lee Address: [address]          [place], [place] [address] Johnnette Litter of [place] Phone: [phone] Relation: Daughter Secondary Emergency Contact: Ritta Slot [place] of [place] Mobile Phone: [phone] Relation: Daughter  Code Status:  DNR Goals of care: Advanced Directive information Advanced Directives 05/04/2021  Does Patient Have a Medical Advance Directive? Yes  Type of Paramedic of [place];Living will;Out of facility DNR (pink MOST or yellow form)  Does patient want to make changes to medical advance directive? No - Patient declined  Copy of [place] in Chart? Yes - validated most recent copy scanned in chart (See row information)  Would patient like information on creating a medical advance directive? -  Pre-existing out of facility DNR order (yellow form or pink MOST form) Yellow form placed in chart (order not valid for inpatient use);Pink MOST form placed in chart (order not valid for inpatient use)     Chief Complaint  Patient presents with   Acute Visit    Acute visit for Increased confusion.    HPI:  Pt is a 87 y.o. female seen today for acute visit for increased confusion.   Diagnosed with left lung cancer with decreasing soft tissue mass s/p radiation.  Refused biopsy. Oncology/pulmonology no further  workups. CT chest 02/2021 revealed left upper lung remains obstructed/collapsed. Hospice consulted. Symptoms include SOB and left chest shoulder pain. Today, nurse requesting UA/culture for increased confusion. Patient alert to self/person/place/situation- disoriented to time(year correct/ month incorrect). She denies urinary symptoms. She is known to not wear her oxygen. Advised importance of wearing oxygen at all times.    Past Medical History:  Diagnosis Date   Altered mental status    ARTHRITIS, KNEES, BILATERAL    Breast CA ([facility]) 2000   s/p R lumpectomy and XRT   CHF (congestive heart failure) (HCC)    DEPRESSION    DJD (degenerative joint disease) of knee    GERD    HYPERLIPIDEMIA    HYPERTENSION    HYPOTHYROIDISM    postsurgical   Hypoxia    MIGRAINE HEADACHE    Morbid obesity (HCC)    OSA (obstructive sleep apnea) 01/17/2011 dx   Oxygen dependent    Urge incontinence    UTI (lower urinary tract infection) 12/2014   Past Surgical History:  Procedure Laterality Date   ABDOMINAL HYSTERECTOMY     ADENOIDECTOMY     BREAST BIOPSY  2000   BREAST LUMPECTOMY  2000   CATARACT EXTRACTION     CHOLECYSTECTOMY  1995   CYSTOSCOPY/URETEROSCOPY/HOLMIUM LASER Right 06/28/2015   Procedure: CYSTOSCOPY RIGHT RETROGRAD RIGHT URETEROSCOPY/HOLMIUM LASER WITH RIGHT STENT PLACEMENT;  Surgeon: Alexis Frock, MD;  Location: [facility];  Service: Urology;  Laterality: Right;   PARATHYROIDECTOMY     2-3 removed   REPLACEMENT TOTAL KNEE BILATERAL  1999, 2005   Right leg femur repaired     THYROIDECTOMY     TONSILLECTOMY  TUBAL LIGATION      Allergies  Allergen Reactions   Atorvastatin Nausea Only   Azithromycin Itching   Beta Adrenergic Blockers Other (See Comments)    Depression    Ciprofloxacin Other (See Comments)    Edgy and very jumpy     Codeine Nausea And Vomiting   Darvon [Propoxyphene] Nausea And Vomiting   Epinephrine Other (See Comments)    Rapid pulse, sweats   Klonopin  [Clonazepam] Other (See Comments)    Makes her feel very suicidal    Oxycodone Other (See Comments)    Patient felt like it altered her mental status "Crazy" . Hallucinations later as well.   Paxil [Paroxetine Hydrochloride] Other (See Comments)    Severe depression    Prednisone Other (See Comments)    "makes me feel really bad"   Propoxyphene Hcl Nausea And Vomiting   Tape Itching   Torsemide Other (See Comments)    Patient stated that she doesn't recall the reaction   Tramadol Other (See Comments)    Feels "jittery"   Vicodin [Hydrocodone-Acetaminophen] Other (See Comments)    Hallucinations   Meloxicam Diarrhea   Other Rash    Polyester sheets "cause me a rash"   Vancomycin Rash    Localized rash related to infusion rate    Outpatient Encounter Medications as of 05/04/2021  Medication Sig   acetaminophen (TYLENOL) 500 MG tablet Take 1,000 mg by mouth 2 (two) times daily.   aspirin 81 MG tablet Take 81 mg by mouth daily.   benzonatate (TESSALON) 200 MG capsule Take 200 mg by mouth 3 (three) times daily as needed for cough. Special Instructions: administer for cough   calcium-vitamin D (OSCAL WITH D) 500-200 MG-UNIT tablet Take 1 tablet by mouth 3 (three) times daily as needed.   CITRACAL PETITES/VITAMIN D 200-250 MG-UNIT TABS Take 1 tablet by mouth every morning.   cyanocobalamin 1000 MCG tablet Take 2,000 mcg by mouth daily.   HYDROcodone-acetaminophen (NORCO/VICODIN) 5-325 MG tablet Take 0.5 tablets by mouth every 6 (six) hours as needed for moderate pain.   ipratropium-albuterol (DUONEB) 0.5-2.5 (3) MG/3ML SOLN Take 3 mLs by nebulization in the morning, at noon, in the evening, and at bedtime.   LORazepam (ATIVAN) 0.5 MG tablet Take 0.5 mg by mouth 2 (two) times daily as needed.   Melatonin 5 MG TABS Take 1 tablet (5 mg total) by mouth at bedtime.   metolazone (ZAROXOLYN) 2.5 MG tablet Special Instructions: take 1 tablet on Sun/Tues/Wed/Thu/Sat.   metolazone (ZAROXOLYN) 2.5  MG tablet Special Instructions: Take 2 tablet on Mondays and Fridays   miconazole (MICOTIN) 2 % cream Apply 1 application topically 2 (two) times daily as needed. Apply to groin area   Morphine Sulfate (MORPHINE CONCENTRATE) 10 mg / 0.5 ml concentrated solution Take 5 mg by mouth every 6 (six) hours as needed for severe pain. 100 mg/5 mL (20 mg/mL) 0.59ml ($RemoveBefore'5mg'ZQrHAmKSpvMci$ )   nystatin (MYCOSTATIN/NYSTOP) powder Apply 1 application topically as needed.   omeprazole (PRILOSEC) 20 MG capsule TAKE 1 CAPSULE TWICE DAILY BEFORE MEALS   OXYGEN Inhale 2 L into the lungs continuous. KEEP O2 SATS >= 90%. Every Shift   potassium chloride (KLOR-CON) 10 MEQ tablet Take 20 mEq by mouth 2 (two) times daily.   predniSONE (DELTASONE) 10 MG tablet Take 10 mg by mouth daily.   QUEtiapine (SEROQUEL) 25 MG tablet TAKE 1/2 TABLET AT BEDTIME.   QUEtiapine (SEROQUEL) 25 MG tablet Take by mouth. Take 1/2 tablet every morning  saccharomyces boulardii (FLORASTOR) 250 MG capsule Take 1 capsule (250 mg total) by mouth daily.   sertraline (ZOLOFT) 100 MG tablet TAKE ONE TABLET BY MOUTH AT BEDTIME   spironolactone (ALDACTONE) 25 MG tablet TAKE 1 TABLET ONCE DAILY.   SYNTHROID 137 MCG tablet TAKE 1 TABLET ONCE DAILY BEFORE BREAKFAST.   traZODone (DESYREL) 50 MG tablet Take 25 mg by mouth at bedtime as needed for sleep.   [DISCONTINUED] Calcium Polycarbophil 625 MG CHEW Chew 1 tablet by mouth 2 (two) times daily.   [DISCONTINUED] metolazone (ZAROXOLYN) 2.5 MG tablet TAKE 1 TABLET FIVE DAYS A WEEK, TAKE 2 TABLETS ON MONDAY AND FRIDAY (Patient taking differently: Take 2.5 mg by mouth. Take 1 tablet on Sun/Tues/Wed/Thu/Sat.)   No facility-administered encounter medications on file as of 05/04/2021.    Review of Systems  Constitutional:  Negative for activity change, appetite change, chills, fatigue and fever.  Respiratory:  Positive for shortness of breath and wheezing. Negative for cough.        Chronic oxygen use  Cardiovascular:   Negative for chest pain and leg swelling.  Gastrointestinal:  Negative for abdominal distention, abdominal pain, constipation and nausea.  Genitourinary:  Negative for dysuria, frequency and hematuria.       Incontinent  Musculoskeletal:  Positive for arthralgias and gait problem.       Left shoulder pain  Skin: Negative.   Neurological:  Positive for weakness. Negative for dizziness and headaches.  Psychiatric/Behavioral:  Negative for confusion and dysphoric mood. The patient is not nervous/anxious.    Immunization History  Administered Date(s) Administered   Influenza Split 03/20/2011   Influenza Whole 03/19/2009, 03/19/2012   Influenza, High Dose Seasonal PF 03/16/2018, 04/20/2019, 03/31/2020   Influenza,inj,Quad PF,6+ Mos 03/19/2013   Influenza-Unspecified 03/03/2014, 03/15/2015, 03/30/2016, 04/05/2017, 04/06/2021   Meningococcal Polysaccharide 03/15/2015   Moderna Sars-Covid-2 Vaccination 06/23/2019, 07/21/2019, 04/27/2020, 11/16/2020   PFIZER(Purple Top)SARS-COV-2 Vaccination 03/08/2021   PPD Test 03/30/2016   Pneumococcal Conjugate-13 03/12/2014   Pneumococcal Polysaccharide-23 06/19/2006, 03/15/2015   Td 10/04/2009   Tdap 06/19/2005, 11/14/2018   Zoster Recombinat (Shingrix) 01/28/2018, 04/22/2018, 05/22/2018   Zoster, Live 06/19/2010   Pertinent  Health Maintenance Due  Topic Date Due   URINE MICROALBUMIN  Never done   FOOT EXAM  06/19/2020   OPHTHALMOLOGY EXAM  07/20/2020   HEMOGLOBIN A1C  05/27/2021   INFLUENZA VACCINE  Completed   DEXA SCAN  Discontinued   Fall Risk 01/10/2021 01/17/2021 02/03/2021 02/16/2021 02/18/2021  Falls in the past year? - - - 0 -  Was there an injury with Fall? - - - - -  Fall Risk Category Calculator - - - - -  Fall Risk Category - - - - -  Patient Fall Risk Level High fall risk Moderate fall risk High fall risk Low fall risk Moderate fall risk  Patient at Risk for Falls Due to - - - - -  Fall risk Follow up - - - Falls evaluation completed  -   Functional Status Survey:    Vitals:   05/04/21 1447  BP: (!) 171/100  Pulse: 98  Resp: 18  Temp: 97.9 F (36.6 C)  SpO2: 91%  Weight: 258 lb 8 oz (117.3 kg)  Height: 5\' 8"  (1.727 m)   Body mass index is 39.3 kg/m. Physical Exam Vitals reviewed.  Constitutional:      General: She is not in acute distress.    Appearance: She is obese.  HENT:     Head: Normocephalic.  Cardiovascular:  Rate and Rhythm: Normal rate and regular rhythm.     Pulses: Normal pulses.     Heart sounds: Normal heart sounds. No murmur heard. Pulmonary:     Effort: Pulmonary effort is normal. No respiratory distress.     Breath sounds: Wheezing present.     Comments: 3 liters oxygen Abdominal:     General: Bowel sounds are normal. There is no distension.     Palpations: Abdomen is soft.     Tenderness: There is no abdominal tenderness.  Musculoskeletal:     Right lower leg: No edema.     Left lower leg: No edema.  Skin:    General: Skin is warm and dry.     Capillary Refill: Capillary refill takes less than 2 seconds.  Neurological:     General: No focal deficit present.     Mental Status: She is alert and oriented to person, place, and time.     Motor: Weakness present.     Gait: Gait abnormal.     Comments: PWC  Psychiatric:        Mood and Affect: Mood normal.        Behavior: Behavior normal.    Labs reviewed: Recent Labs    01/06/21 0524 01/17/21 0737 02/18/21 0539  NA 140 136 140  K 3.7 3.7 3.5  CL 94* 95* 96*  CO2 35* 33* 32  GLUCOSE 89 102* 115*  BUN $Re'17 14 20  'QQG$ CREATININE 0.70 0.90 0.98  CALCIUM 8.9 8.9 8.7*   Recent Labs    11/25/20 0740 01/06/21 0524 02/18/21 0539  AST 12 12* 16  ALT $Re'7 11 14  'GuK$ ALKPHOS  --  69 67  BILITOT 0.4 0.7 0.5  PROT 6.2 6.1* 6.4*  ALBUMIN  --  3.0* 3.3*   Recent Labs    11/25/20 0740 01/01/21 0157 01/05/21 0406 01/06/21 0524 01/09/21 0600 01/17/21 0737 02/18/21 0539  WBC 6.7 8.0 7.3   < > 6.5 8.4 8.8  NEUTROABS 4,456  5.7 5.3  --   --   --   --   HGB 13.3 13.9 12.9   < > 13.8 15.0 12.6  HCT 41.2 44.7 41.5   < > 44.3 46.8* 40.2  MCV 87.7 89.8 91.6   < > 89.3 89.1 91.0  PLT 116* 129* 118*   < > 140* PLATELET CLUMPS NOTED ON SMEAR, UNABLE TO ESTIMATE 114*   < > = values in this interval not displayed.   Lab Results  Component Value Date   TSH 2.21 02/19/2021   Lab Results  Component Value Date   HGBA1C 6.1 (H) 11/25/2020   Lab Results  Component Value Date   CHOL 145 04/01/2020   HDL 56 04/01/2020   LDLCALC 71 04/01/2020   LDLDIRECT 99.4 03/13/2012   TRIG 95 04/01/2020   CHOLHDL 2.6 04/01/2020    Significant Diagnostic Results in last 30 days:  No results found.  Assessment/Plan 1. Confusion - suspect mild confusion when not wearing oxygen -appropriate today- alert x 4 - denies urinary symptoms- do not recommend UA/culture  - cont continuous oxygen use - cont seroquel bid  - cont ativan bid prn  2. Mass of upper lobe of left lung - no further workup per oncology/pulmonology - followed by hospice - remains on continuous oxygen - symptoms SOB/left chest and shoulder pain - cont norco and morphine prn for pain - cont nebs for wheezing    Family/ staff Communication: plan discussed with patient and nurse  Labs/tests ordered:  none

## 2021-05-05 DIAGNOSIS — I130 Hypertensive heart and chronic kidney disease with heart failure and stage 1 through stage 4 chronic kidney disease, or unspecified chronic kidney disease: Secondary | ICD-10-CM | POA: Diagnosis not present

## 2021-05-05 DIAGNOSIS — F0150 Vascular dementia without behavioral disturbance: Secondary | ICD-10-CM | POA: Diagnosis not present

## 2021-05-05 DIAGNOSIS — N183 Chronic kidney disease, stage 3 unspecified: Secondary | ICD-10-CM | POA: Diagnosis not present

## 2021-05-05 DIAGNOSIS — C3492 Malignant neoplasm of unspecified part of left bronchus or lung: Secondary | ICD-10-CM | POA: Diagnosis not present

## 2021-05-05 DIAGNOSIS — E1122 Type 2 diabetes mellitus with diabetic chronic kidney disease: Secondary | ICD-10-CM | POA: Diagnosis not present

## 2021-05-05 DIAGNOSIS — I5032 Chronic diastolic (congestive) heart failure: Secondary | ICD-10-CM | POA: Diagnosis not present

## 2021-05-09 ENCOUNTER — Non-Acute Institutional Stay (SKILLED_NURSING_FACILITY): Payer: Medicare Other | Admitting: Nurse Practitioner

## 2021-05-09 ENCOUNTER — Encounter: Payer: Self-pay | Admitting: Nurse Practitioner

## 2021-05-09 DIAGNOSIS — I5032 Chronic diastolic (congestive) heart failure: Secondary | ICD-10-CM | POA: Diagnosis not present

## 2021-05-09 DIAGNOSIS — E039 Hypothyroidism, unspecified: Secondary | ICD-10-CM

## 2021-05-09 DIAGNOSIS — N183 Chronic kidney disease, stage 3 unspecified: Secondary | ICD-10-CM | POA: Diagnosis not present

## 2021-05-09 DIAGNOSIS — K219 Gastro-esophageal reflux disease without esophagitis: Secondary | ICD-10-CM

## 2021-05-09 DIAGNOSIS — R7303 Prediabetes: Secondary | ICD-10-CM

## 2021-05-09 DIAGNOSIS — R918 Other nonspecific abnormal finding of lung field: Secondary | ICD-10-CM | POA: Diagnosis not present

## 2021-05-09 DIAGNOSIS — G4733 Obstructive sleep apnea (adult) (pediatric): Secondary | ICD-10-CM

## 2021-05-09 DIAGNOSIS — F01A4 Vascular dementia, mild, with anxiety: Secondary | ICD-10-CM

## 2021-05-09 DIAGNOSIS — R053 Chronic cough: Secondary | ICD-10-CM | POA: Diagnosis not present

## 2021-05-09 DIAGNOSIS — I10 Essential (primary) hypertension: Secondary | ICD-10-CM

## 2021-05-09 DIAGNOSIS — I130 Hypertensive heart and chronic kidney disease with heart failure and stage 1 through stage 4 chronic kidney disease, or unspecified chronic kidney disease: Secondary | ICD-10-CM | POA: Diagnosis not present

## 2021-05-09 DIAGNOSIS — E1122 Type 2 diabetes mellitus with diabetic chronic kidney disease: Secondary | ICD-10-CM | POA: Diagnosis not present

## 2021-05-09 DIAGNOSIS — R410 Disorientation, unspecified: Secondary | ICD-10-CM

## 2021-05-09 DIAGNOSIS — F0150 Vascular dementia without behavioral disturbance: Secondary | ICD-10-CM | POA: Diagnosis not present

## 2021-05-09 DIAGNOSIS — E538 Deficiency of other specified B group vitamins: Secondary | ICD-10-CM

## 2021-05-09 DIAGNOSIS — F418 Other specified anxiety disorders: Secondary | ICD-10-CM

## 2021-05-09 DIAGNOSIS — E785 Hyperlipidemia, unspecified: Secondary | ICD-10-CM

## 2021-05-09 DIAGNOSIS — C3492 Malignant neoplasm of unspecified part of left bronchus or lung: Secondary | ICD-10-CM | POA: Diagnosis not present

## 2021-05-09 DIAGNOSIS — R441 Visual hallucinations: Secondary | ICD-10-CM

## 2021-05-09 NOTE — Assessment & Plan Note (Signed)
Hx of chronic edema BLE, takes Metolazone, Spironolactone daily, BNP 59.5 01/17/21. Bun/creat 20/0.98 eGFR 56 02/18/21

## 2021-05-09 NOTE — Progress Notes (Addendum)
Location:   [facility] Room Number: 35 A Place of Service:  SNF (31) Provider:  Rebie Peale X, NP  Virgie Dad, MD  Patient Care Team: Virgie Dad, MD as PCP - General (Internal Medicine) Minus Breeding, MD as PCP - Cardiology (Cardiology) Azucena Fallen, MD as Consulting Physician (Obstetrics and Gynecology) Chesley Mires, MD as Consulting Physician (Pulmonary Disease) Arrayah Connors X, NP as Nurse Practitioner (Internal Medicine) Christin Fudge, MD (Inactive) as Consulting Physician (Surgery)  Extended Emergency Contact Information Primary Emergency Contact: Boxman,Lee Address: [address]          [place], [place] [address] Johnnette Litter of [place] Phone: [phone] Relation: Daughter Secondary Emergency Contact: Ritta Slot [place] of [place] Mobile Phone: [phone] Relation: Daughter  Code Status: DNR  Goals of care: Advanced Directive information Advanced Directives 05/09/2021  Does Patient Have a Medical Advance Directive? Yes  Type of Paramedic of [place];Living will;Out of facility DNR (pink MOST or yellow form)  Does patient want to make changes to medical advance directive? No - Patient declined  Copy of [place] in Chart? Yes - validated most recent copy scanned in chart (See row information)  Would patient like information on creating a medical advance directive? -  Pre-existing out of facility DNR order (yellow form or pink MOST form) Yellow form placed in chart (order not valid for inpatient use);Pink MOST form placed in chart (order not valid for inpatient use)     Chief Complaint  Patient presents with   Acute Visit    Acute visit for Increased confusion.    HPI:  Pt is a 87 y.o. female seen today for an acute visit for c/o increased confusion, no new focal neurological deficits noted. Denied dysuria, increased urinary incontinence, urinary frequency, or urgency. She  is afebrile.    Lung mass, SOB, s/p  radiation tx, refused biopsy, Oncology/Pulmonology no further workups,  left upper lobe mass CT angio chest 01/17/21-02/18/21 Left lung cancer with decreasing soft tissue mass when compared to CT 1 month ago. The left upper lobe remains collapsed/obstructed.. C/o left upper chest/shoulder region pain, Tylenol, Norco, under Hospice service. On Prednisone, O2.              Chronic bronchitis, takes Prednisone, Neb, O2, Tessalon              Hx of chronic edema BLE/CHF, takes Metolazone, Spironolactone daily, BNP 59.5 01/17/21. Bun/creat 20/0.98 eGFR 56 02/18/21             GERD, takes Omeprazole, Hgb 12.6 02/18/21             Hyperlipidemia, off statin             Hypothyroidism, takes Synthroid, TSH 2.21 02/19/21             OSA, CPAP, O2 via [place]             Visual hallucination, takes Quetiapine, MRI showed atrophy             Depression/anxiety, takes Sertraline, Trazodone, Lorazepam              Vit B12, supplemented with Vit B12             Dementia, off Memantine, on ASA 14m qd              Prediabetes, Hgb a1c. 6.1 11/25/20  HTN, takes Spironolactone, Metolazone.   Past Medical History:  Diagnosis Date   Altered mental status    ARTHRITIS, KNEES, BILATERAL    Breast CA ([facility]) 2000   s/p R lumpectomy and XRT   CHF (congestive heart failure) (HCC)    DEPRESSION    DJD (degenerative joint disease) of knee    GERD    HYPERLIPIDEMIA    HYPERTENSION    HYPOTHYROIDISM    postsurgical   Hypoxia    MIGRAINE HEADACHE    Morbid obesity (HCC)    OSA (obstructive sleep apnea) 01/17/2011 dx   Oxygen dependent    Urge incontinence    UTI (lower urinary tract infection) 12/2014   Past Surgical History:  Procedure Laterality Date   ABDOMINAL HYSTERECTOMY     ADENOIDECTOMY     BREAST BIOPSY  2000   BREAST LUMPECTOMY  2000   CATARACT EXTRACTION     CHOLECYSTECTOMY  1995   CYSTOSCOPY/URETEROSCOPY/HOLMIUM LASER Right 06/28/2015   Procedure: CYSTOSCOPY RIGHT  RETROGRAD RIGHT URETEROSCOPY/HOLMIUM LASER WITH RIGHT STENT PLACEMENT;  Surgeon: Alexis Frock, MD;  Location: [facility];  Service: Urology;  Laterality: Right;   PARATHYROIDECTOMY     2-3 removed   REPLACEMENT TOTAL KNEE BILATERAL  1999, 2005   Right leg femur repaired     THYROIDECTOMY     TONSILLECTOMY     TUBAL LIGATION      Allergies  Allergen Reactions   Atorvastatin Nausea Only   Azithromycin Itching   Beta Adrenergic Blockers Other (See Comments)    Depression    Ciprofloxacin Other (See Comments)    Edgy and very jumpy     Codeine Nausea And Vomiting   Darvon [Propoxyphene] Nausea And Vomiting   Epinephrine Other (See Comments)    Rapid pulse, sweats   Klonopin [Clonazepam] Other (See Comments)    Makes her feel very suicidal    Oxycodone Other (See Comments)    Patient felt like it altered her mental status "Crazy" . Hallucinations later as well.   Paxil [Paroxetine Hydrochloride] Other (See Comments)    Severe depression    Prednisone Other (See Comments)    "makes me feel really bad"   Propoxyphene Hcl Nausea And Vomiting   Tape Itching   Torsemide Other (See Comments)    Patient stated that she doesn't recall the reaction   Tramadol Other (See Comments)    Feels "jittery"   Vicodin [Hydrocodone-Acetaminophen] Other (See Comments)    Hallucinations   Meloxicam Diarrhea   Other Rash    Polyester sheets "cause me a rash"   Vancomycin Rash    Localized rash related to infusion rate    Allergies as of 05/09/2021       Reactions   Atorvastatin Nausea Only   Azithromycin Itching   Beta Adrenergic Blockers Other (See Comments)   Depression   Ciprofloxacin Other (See Comments)   Edgy and very jumpy    Codeine Nausea And Vomiting   Darvon [propoxyphene] Nausea And Vomiting   Epinephrine Other (See Comments)   Rapid pulse, sweats   Klonopin [clonazepam] Other (See Comments)   Makes her feel very suicidal    Oxycodone Other (See Comments)   Patient felt  like it altered her mental status "Crazy" . Hallucinations later as well.   Paxil [paroxetine Hydrochloride] Other (See Comments)   Severe depression    Prednisone Other (See Comments)   "makes me feel really bad"   Propoxyphene Hcl Nausea And Vomiting   Tape Itching  Torsemide Other (See Comments)   Patient stated that she doesn't recall the reaction   Tramadol Other (See Comments)   Feels "jittery"   Vicodin [hydrocodone-acetaminophen] Other (See Comments)   Hallucinations   Meloxicam Diarrhea   Other Rash   Polyester sheets "cause me a rash"   Vancomycin Rash   Localized rash related to infusion rate        Medication List        Accurate as of May 09, 2021 11:59 PM. If you have any questions, ask your nurse or doctor.          acetaminophen 500 MG tablet Commonly known as: TYLENOL Take 1,000 mg by mouth 2 (two) times daily.   aspirin 81 MG tablet Take 81 mg by mouth daily.   benzonatate 200 MG capsule Commonly known as: TESSALON Take 200 mg by mouth 3 (three) times daily as needed for cough. Special Instructions: administer for cough   calcium-vitamin D 500-200 MG-UNIT tablet Commonly known as: OSCAL WITH D Take 1 tablet by mouth 3 (three) times daily as needed.   Citracal Petites/Vitamin D 200-6.25 MG-MCG Tabs Generic drug: Calcium Citrate-Vitamin D Take 1 tablet by mouth every morning.   cyanocobalamin 1000 MCG tablet Take 2,000 mcg by mouth daily.   HYDROcodone-acetaminophen 5-325 MG tablet Commonly known as: NORCO/VICODIN Take 0.5 tablets by mouth every 6 (six) hours as needed for moderate pain.   ipratropium-albuterol 0.5-2.5 (3) MG/3ML Soln Commonly known as: DUONEB Take 3 mLs by nebulization in the morning, at noon, in the evening, and at bedtime.   LORazepam 0.5 MG tablet Commonly known as: ATIVAN Take 0.5 mg by mouth 2 (two) times daily as needed.   melatonin 5 MG Tabs Take 1 tablet (5 mg total) by mouth at bedtime.   metolazone  2.5 MG tablet Commonly known as: ZAROXOLYN Special Instructions: take 1 tablet on Sun/Tues/Wed/Thu/Sat.   metolazone 2.5 MG tablet Commonly known as: ZAROXOLYN Special Instructions: Take 2 tablet on Mondays and Fridays   miconazole 2 % cream Commonly known as: MICOTIN Apply 1 application topically 2 (two) times daily as needed. Apply to groin area   morphine CONCENTRATE 10 mg / 0.5 ml concentrated solution Take 5 mg by mouth every 6 (six) hours as needed for severe pain. 100 mg/5 mL (20 mg/mL) 0.44m (561m   nystatin powder Commonly known as: MYCOSTATIN/NYSTOP Apply 1 application topically as needed.   omeprazole 20 MG capsule Commonly known as: PRILOSEC TAKE 1 CAPSULE TWICE DAILY BEFORE MEALS   OXYGEN Inhale 2 L into the lungs continuous. KEEP O2 SATS >= 90%. Every Shift   potassium chloride 10 MEQ tablet Commonly known as: KLOR-CON Take 20 mEq by mouth 2 (two) times daily.   predniSONE 10 MG tablet Commonly known as: DELTASONE Take 10 mg by mouth daily.   QUEtiapine 25 MG tablet Commonly known as: SEROQUEL TAKE 1/2 TABLET AT BEDTIME.   QUEtiapine 25 MG tablet Commonly known as: SEROQUEL Take by mouth. Take 1/2 tablet every morning   saccharomyces boulardii 250 MG capsule Commonly known as: FLORASTOR Take 1 capsule (250 mg total) by mouth daily.   sertraline 100 MG tablet Commonly known as: ZOLOFT TAKE ONE TABLET BY MOUTH AT BEDTIME   spironolactone 25 MG tablet Commonly known as: ALDACTONE TAKE 1 TABLET ONCE DAILY.   Synthroid 137 MCG tablet Generic drug: levothyroxine TAKE 1 TABLET ONCE DAILY BEFORE BREAKFAST.   traZODone 50 MG tablet Commonly known as: DESYREL Take 25 mg by mouth at  bedtime as needed for sleep.        Review of Systems  Constitutional:  Positive for fatigue. Negative for appetite change and fever.  HENT:  Positive for hearing loss. Negative for congestion and voice change.   Eyes:  Negative for visual disturbance.   Respiratory:  Positive for cough, shortness of breath and wheezing. Negative for choking.        Chronic hacking cough. DOE  Cardiovascular:  Positive for chest pain and leg swelling. Negative for palpitations.  Gastrointestinal:  Negative for abdominal pain and constipation.       Chronic, occasional diarrhea  Endocrine:       Get cold easily  Genitourinary:  Negative for dysuria, frequency, hematuria and urgency.       Chronic urinary leakage.   Musculoskeletal:  Positive for arthralgias and gait problem.       Pain in the tip of the left shoulder, worsens with pulling, pushing, or overhead movement.   Skin:  Negative for color change.  Neurological:  Negative for speech difficulty, weakness and headaches.  Psychiatric/Behavioral:  Positive for confusion. Negative for hallucinations and sleep disturbance. The patient is not nervous/anxious.    Immunization History  Administered Date(s) Administered   Influenza Split 03/20/2011   Influenza Whole 03/19/2009, 03/19/2012   Influenza, High Dose Seasonal PF 03/16/2018, 04/20/2019, 03/31/2020   Influenza,inj,Quad PF,6+ Mos 03/19/2013   Influenza-Unspecified 03/03/2014, 03/15/2015, 03/30/2016, 04/05/2017, 04/06/2021   Meningococcal Polysaccharide 03/15/2015   Moderna Sars-Covid-2 Vaccination 06/23/2019, 07/21/2019, 04/27/2020, 11/16/2020   PFIZER(Purple Top)SARS-COV-2 Vaccination 03/08/2021   PPD Test 03/30/2016   Pneumococcal Conjugate-13 03/12/2014   Pneumococcal Polysaccharide-23 06/19/2006, 03/15/2015   Td 10/04/2009   Tdap 06/19/2005, 11/14/2018   Zoster Recombinat (Shingrix) 01/28/2018, 04/22/2018, 05/22/2018   Zoster, Live 06/19/2010   Pertinent  Health Maintenance Due  Topic Date Due   URINE MICROALBUMIN  Never done   FOOT EXAM  06/19/2020   OPHTHALMOLOGY EXAM  07/20/2020   HEMOGLOBIN A1C  05/27/2021   INFLUENZA VACCINE  Completed   DEXA SCAN  Discontinued   Fall Risk 01/10/2021 01/17/2021 02/03/2021 02/16/2021 02/18/2021   Falls in the past year? - - - 0 -  Was there an injury with Fall? - - - - -  Fall Risk Category Calculator - - - - -  Fall Risk Category - - - - -  Patient Fall Risk Level High fall risk Moderate fall risk High fall risk Low fall risk Moderate fall risk  Patient at Risk for Falls Due to - - - - -  Fall risk Follow up - - - Falls evaluation completed -   Functional Status Survey:    Vitals:   05/09/21 1332  BP: 130/74  Pulse: 96  Resp: 20  Temp: 97.6 F (36.4 C)  SpO2: 95%  Weight: 258 lb 8 oz (117.3 kg)  Height: '5\' 8"'  (1.727 m)   Body mass index is 39.3 kg/m. Physical Exam Vitals and nursing note reviewed.  Constitutional:      Comments: sleepy  HENT:     Head: Normocephalic and atraumatic.     Mouth/Throat:     Mouth: Mucous membranes are moist.  Eyes:     Extraocular Movements: Extraocular movements intact.     Conjunctiva/sclera: Conjunctivae normal.     Pupils: Pupils are equal, round, and reactive to light.  Cardiovascular:     Rate and Rhythm: Normal rate and regular rhythm.     Heart sounds: No murmur heard. Pulmonary:  Breath sounds: Wheezing and rales present. No rhonchi.     Comments: Uses O2. C/o pain 7-8/10 left upper chest/shoulder region.  Abdominal:     General: Bowel sounds are normal.     Palpations: Abdomen is soft.     Tenderness: There is no abdominal tenderness.  Genitourinary:    Comments: Small external hemorrhoids 5-6pm previously.  Musculoskeletal:     Cervical back: Normal range of motion and neck supple.     Right lower leg: Edema present.     Left lower leg: Edema present.     Comments: Trace edema BLE.   Skin:    General: Skin is warm and dry.     Comments: Brownish venous insufficiency skin changes BLE  Neurological:     General: No focal deficit present.     Mental Status: She is alert and oriented to person, place, and time. Mental status is at baseline.     Gait: Gait abnormal.  Psychiatric:        Mood and Affect:  Mood normal.        Behavior: Behavior normal.        Thought Content: Thought content normal.    Labs reviewed: Recent Labs    01/06/21 0524 01/17/21 0737 02/18/21 0539  NA 140 136 140  K 3.7 3.7 3.5  CL 94* 95* 96*  CO2 35* 33* 32  GLUCOSE 89 102* 115*  BUN '17 14 20  ' CREATININE 0.70 0.90 0.98  CALCIUM 8.9 8.9 8.7*   Recent Labs    11/25/20 0740 01/06/21 0524 02/18/21 0539  AST 12 12* 16  ALT '7 11 14  ' ALKPHOS  --  69 67  BILITOT 0.4 0.7 0.5  PROT 6.2 6.1* 6.4*  ALBUMIN  --  3.0* 3.3*   Recent Labs    11/25/20 0740 01/01/21 0157 01/05/21 0406 01/06/21 0524 01/09/21 0600 01/17/21 0737 02/18/21 0539  WBC 6.7 8.0 7.3   < > 6.5 8.4 8.8  NEUTROABS 4,456 5.7 5.3  --   --   --   --   HGB 13.3 13.9 12.9   < > 13.8 15.0 12.6  HCT 41.2 44.7 41.5   < > 44.3 46.8* 40.2  MCV 87.7 89.8 91.6   < > 89.3 89.1 91.0  PLT 116* 129* 118*   < > 140* PLATELET CLUMPS NOTED ON SMEAR, UNABLE TO ESTIMATE 114*   < > = values in this interval not displayed.   Lab Results  Component Value Date   TSH 2.21 02/19/2021   Lab Results  Component Value Date   HGBA1C 6.1 (H) 11/25/2020   Lab Results  Component Value Date   CHOL 145 04/01/2020   HDL 56 04/01/2020   LDLCALC 71 04/01/2020   LDLDIRECT 99.4 03/13/2012   TRIG 95 04/01/2020   CHOLHDL 2.6 04/01/2020    Significant Diagnostic Results in last 30 days:  No results found.  Assessment/Plan Confusion  c/o increased confusion, no new focal neurological deficits noted. Denied dysuria, increased urinary incontinence, urinary frequency, or urgency. She is afebrile, update CBC/diff, CMP/eGFR in setting of muscle twitching at night.  05/10/21 Na 141, K 4.2, Bun 16, creat 1.00, eGFR 54, Wbc 6.8, Hgb 12.3, plt 111, neutrophils 74.7%  Mass of upper lobe of left lung Lung mass, SOB, s/p  radiation tx, refused biopsy, Oncology/Pulmonology no further workups,  left upper lobe mass CT angio chest 01/17/21-02/18/21 Left lung cancer with  decreasing soft tissue mass when compared to CT 1 month  ago. The left upper lobe remains collapsed/obstructed.. C/o left upper chest/shoulder region pain, Tylenol, Norco, under Hospice service. On Prednisone, O2.   Cough Chronic bronchitis, takes Prednisone, Neb, O2, Tessalon   Chronic diastolic CHF (congestive heart failure) (HCC) Hx of chronic edema BLE, takes Metolazone, Spironolactone daily, BNP 59.5 01/17/21. Bun/creat 20/0.98 eGFR 56 02/18/21  GERD (gastroesophageal reflux disease) takes Omeprazole, Hgb 12.6 02/18/21  Hyperlipidemia LDL goal <70 off statin  Hypothyroidism (acquired)  takes Synthroid, TSH 2.21 02/19/21  OSA (obstructive sleep apnea) CPAP, O2 via [place]  Hallucination, visual  Stable, takes Quetiapine, MRI showed atrophy 2020, underwent Neurology consultation.   Depression with anxiety  takes Sertraline, Trazodone, Lorazepam   Vitamin B12 deficiency supplemented with Vit B12  Vascular dementia (HCC) off Memantine, on ASA 12m qd  Prediabetes Hgb a1c. 6.1 11/25/20  Essential hypertension Blood pressure is controlled,  takes Spironolactone, Metolazone.     Family/ staff Communication: plan of care reviewed with the patient and charge nurse.   Labs/tests ordered:   CBC/diff, CMP/eGFR  Time spend 35 minutes.

## 2021-05-09 NOTE — Assessment & Plan Note (Signed)
off statin

## 2021-05-09 NOTE — Assessment & Plan Note (Addendum)
c/o increased confusion, no new focal neurological deficits noted. Denied dysuria, increased urinary incontinence, urinary frequency, or urgency. She is afebrile, update CBC/diff, CMP/eGFR in setting of muscle twitching at night.  05/10/21 Na 141, K 4.2, Bun 16, creat 1.00, eGFR 54, Wbc 6.8, Hgb 12.3, plt 111, neutrophils 74.7%

## 2021-05-09 NOTE — Assessment & Plan Note (Signed)
takes Omeprazole, Hgb 12.6 02/18/21

## 2021-05-09 NOTE — Assessment & Plan Note (Signed)
off Memantine, on ASA $Remo'81mg'vZPTa$  qd

## 2021-05-09 NOTE — Assessment & Plan Note (Signed)
supplemented with Vit B12

## 2021-05-09 NOTE — Assessment & Plan Note (Signed)
takes Sertraline, Trazodone, Lorazepam

## 2021-05-09 NOTE — Assessment & Plan Note (Addendum)
Stable, takes Quetiapine, MRI showed atrophy 2020, underwent Neurology consultation.

## 2021-05-09 NOTE — Assessment & Plan Note (Signed)
CPAP, O2 via [place]

## 2021-05-09 NOTE — Assessment & Plan Note (Signed)
Blood pressure is controlled,  takes Spironolactone, Metolazone.

## 2021-05-09 NOTE — Assessment & Plan Note (Signed)
takes Synthroid, TSH 2.21 02/19/21

## 2021-05-09 NOTE — Assessment & Plan Note (Signed)
Chronic bronchitis, takes Prednisone, Neb, O2, Tessalon

## 2021-05-09 NOTE — Assessment & Plan Note (Signed)
Hgb a1c. 6.1 11/25/20

## 2021-05-09 NOTE — Assessment & Plan Note (Signed)
Lung mass, SOB, s/p  radiation tx, refused biopsy, Oncology/Pulmonology no further workups,  left upper lobe mass CT angio chest 01/17/21-02/18/21 Left lung cancer with decreasing soft tissue mass when compared to CT 1 month ago. The left upper lobe remains collapsed/obstructed.. C/o left upper chest/shoulder region pain, Tylenol, Norco, under Hospice service. On Prednisone, O2.

## 2021-05-10 ENCOUNTER — Encounter: Payer: Self-pay | Admitting: Nurse Practitioner

## 2021-05-10 DIAGNOSIS — I110 Hypertensive heart disease with heart failure: Secondary | ICD-10-CM | POA: Diagnosis not present

## 2021-05-10 DIAGNOSIS — C3492 Malignant neoplasm of unspecified part of left bronchus or lung: Secondary | ICD-10-CM | POA: Diagnosis not present

## 2021-05-10 DIAGNOSIS — I5032 Chronic diastolic (congestive) heart failure: Secondary | ICD-10-CM | POA: Diagnosis not present

## 2021-05-10 LAB — CBC AND DIFFERENTIAL
HCT: 38 (ref 36–46)
Hemoglobin: 12.3 (ref 12.0–16.0)
Neutrophils Absolute: 5080
Platelets: 111 — AB (ref 150–399)
WBC: 6.8

## 2021-05-10 LAB — COMPREHENSIVE METABOLIC PANEL
Albumin: 3.4 — AB (ref 3.5–5.0)
Calcium: 8.9 (ref 8.7–10.7)
Globulin: 2.2

## 2021-05-10 LAB — BASIC METABOLIC PANEL
BUN: 16 (ref 4–21)
CO2: 33 — AB (ref 13–22)
Chloride: 99 (ref 99–108)
Creatinine: 1 (ref 0.5–1.1)
Glucose: 95
Potassium: 4.2 (ref 3.4–5.3)
Sodium: 141 (ref 137–147)

## 2021-05-10 LAB — CBC: RBC: 4.38 (ref 3.87–5.11)

## 2021-05-10 LAB — HEPATIC FUNCTION PANEL
ALT: 18 (ref 7–35)
AST: 16 (ref 13–35)
Alkaline Phosphatase: 70 (ref 25–125)
Bilirubin, Total: 0.4

## 2021-05-13 DIAGNOSIS — N183 Chronic kidney disease, stage 3 unspecified: Secondary | ICD-10-CM | POA: Diagnosis not present

## 2021-05-13 DIAGNOSIS — I130 Hypertensive heart and chronic kidney disease with heart failure and stage 1 through stage 4 chronic kidney disease, or unspecified chronic kidney disease: Secondary | ICD-10-CM | POA: Diagnosis not present

## 2021-05-13 DIAGNOSIS — C3492 Malignant neoplasm of unspecified part of left bronchus or lung: Secondary | ICD-10-CM | POA: Diagnosis not present

## 2021-05-13 DIAGNOSIS — F0150 Vascular dementia without behavioral disturbance: Secondary | ICD-10-CM | POA: Diagnosis not present

## 2021-05-13 DIAGNOSIS — I5032 Chronic diastolic (congestive) heart failure: Secondary | ICD-10-CM | POA: Diagnosis not present

## 2021-05-13 DIAGNOSIS — E1122 Type 2 diabetes mellitus with diabetic chronic kidney disease: Secondary | ICD-10-CM | POA: Diagnosis not present

## 2021-05-16 ENCOUNTER — Non-Acute Institutional Stay (SKILLED_NURSING_FACILITY): Payer: Medicare Other | Admitting: Nurse Practitioner

## 2021-05-16 DIAGNOSIS — R7303 Prediabetes: Secondary | ICD-10-CM

## 2021-05-16 DIAGNOSIS — J302 Other seasonal allergic rhinitis: Secondary | ICD-10-CM | POA: Diagnosis not present

## 2021-05-16 DIAGNOSIS — R053 Chronic cough: Secondary | ICD-10-CM

## 2021-05-16 DIAGNOSIS — I10 Essential (primary) hypertension: Secondary | ICD-10-CM | POA: Diagnosis not present

## 2021-05-16 DIAGNOSIS — I5032 Chronic diastolic (congestive) heart failure: Secondary | ICD-10-CM

## 2021-05-16 DIAGNOSIS — E039 Hypothyroidism, unspecified: Secondary | ICD-10-CM

## 2021-05-16 DIAGNOSIS — R441 Visual hallucinations: Secondary | ICD-10-CM

## 2021-05-16 DIAGNOSIS — R918 Other nonspecific abnormal finding of lung field: Secondary | ICD-10-CM

## 2021-05-16 DIAGNOSIS — F418 Other specified anxiety disorders: Secondary | ICD-10-CM

## 2021-05-16 DIAGNOSIS — K219 Gastro-esophageal reflux disease without esophagitis: Secondary | ICD-10-CM

## 2021-05-16 DIAGNOSIS — H04123 Dry eye syndrome of bilateral lacrimal glands: Secondary | ICD-10-CM

## 2021-05-16 DIAGNOSIS — F01A4 Vascular dementia, mild, with anxiety: Secondary | ICD-10-CM

## 2021-05-16 DIAGNOSIS — E538 Deficiency of other specified B group vitamins: Secondary | ICD-10-CM

## 2021-05-16 NOTE — Assessment & Plan Note (Signed)
Lung mass, SOB, s/p  radiation tx, refused biopsy, Oncology/Pulmonology no further workups,  left upper lobe mass CT angio chest 01/17/21-02/18/21 Left lung cancer with decreasing soft tissue mass when compared to CT 1 month ago. The left upper lobe remains collapsed/obstructed.. C/o left upper chest/shoulder region pain, Tylenol, Norco, under Hospice service. On Prednisone, O2.

## 2021-05-16 NOTE — Progress Notes (Signed)
Location:   SNF [place] Room Number: 35 Place of Service:  SNF (31) Provider: [facility] Nigil Braman NP  Virgie Dad, MD  Patient Care Team: Virgie Dad, MD as PCP - General (Internal Medicine) Minus Breeding, MD as PCP - Cardiology (Cardiology) Azucena Fallen, MD as Consulting Physician (Obstetrics and Gynecology) Chesley Mires, MD as Consulting Physician (Pulmonary Disease) Layah Skousen X, NP as Nurse Practitioner (Internal Medicine) Christin Fudge, MD (Inactive) as Consulting Physician (Surgery)  Extended Emergency Contact Information Primary Emergency Contact: Boxman,Lee Address: [address]          [place], [place] [address] Johnnette Litter of [place] Phone: [phone] Relation: Daughter Secondary Emergency Contact: Ritta Slot [place] of [place] Mobile Phone: [phone] Relation: Daughter  Code Status: DNR Goals of care: Advanced Directive information Advanced Directives 05/09/2021  Does Patient Have a Medical Advance Directive? Yes  Type of Paramedic of [place];Living will;Out of facility DNR (pink MOST or yellow form)  Does patient want to make changes to medical advance directive? No - Patient declined  Copy of [place] in Chart? Yes - validated most recent copy scanned in chart (See row information)  Would patient like information on creating a medical advance directive? -  Pre-existing out of facility DNR order (yellow form or pink MOST form) Yellow form placed in chart (order not valid for inpatient use);Pink MOST form placed in chart (order not valid for inpatient use)     Chief Complaint  Patient presents with   Acute Visit    Dry eyes, nasal discomfort.      HPI:  Pt is a 87 y.o. female seen today for an acute visit for c/o dry eyes, nasal discomfort. Denied facial pressure/pain, sore throat, change of her chronic cough or chest discomfort/pain. She is afebrile.     Lung mass, SOB, s/p   radiation tx, refused biopsy, Oncology/Pulmonology no further workups,  left upper lobe mass CT angio chest 01/17/21-02/18/21 Left lung cancer with decreasing soft tissue mass when compared to CT 1 month ago. The left upper lobe remains collapsed/obstructed.. C/o left upper chest/shoulder region pain, Tylenol, Norco, under Hospice service. On Prednisone, O2.   Hx of chronic rhinitis, dry eyes.              Chronic bronchitis, takes Prednisone, Neb, O2, Tessalon              Hx of chronic edema BLE/CHF, takes Metolazone, Spironolactone daily, BNP 59.5 01/17/21. Bun/creat 16/1.0, eGFR 54 05/10/21             GERD, takes Omeprazole, Hgb 12.3 05/10/21             Hyperlipidemia, off statin             Hypothyroidism, takes Synthroid, TSH 2.21 02/19/21             OSA, CPAP, O2 via [place]             Visual hallucination, takes Quetiapine, MRI showed atrophy             Depression/anxiety, takes Sertraline, Trazodone, Lorazepam              Vit B12, supplemented with Vit B12. Hgb 12.3 05/10/21             Dementia, off Memantine, on ASA 50m qd, baseline mentation.              Prediabetes, Hgb a1c. 6.1 11/25/20  HTN, takes Spironolactone, Metolazone.   Past Medical History:  Diagnosis Date   Altered mental status    ARTHRITIS, KNEES, BILATERAL    Breast CA ([place]) 2000   s/p R lumpectomy and XRT   CHF (congestive heart failure) (HCC)    DEPRESSION    DJD (degenerative joint disease) of knee    GERD    HYPERLIPIDEMIA    HYPERTENSION    HYPOTHYROIDISM    postsurgical   Hypoxia    MIGRAINE HEADACHE    Morbid obesity (HCC)    OSA (obstructive sleep apnea) 01/17/2011 dx   Oxygen dependent    Urge incontinence    UTI (lower urinary tract infection) 12/2014   Past Surgical History:  Procedure Laterality Date   ABDOMINAL HYSTERECTOMY     ADENOIDECTOMY     BREAST BIOPSY  2000   BREAST LUMPECTOMY  2000   CATARACT EXTRACTION     CHOLECYSTECTOMY  1995   CYSTOSCOPY/URETEROSCOPY/HOLMIUM LASER  Right 06/28/2015   Procedure: CYSTOSCOPY RIGHT RETROGRAD RIGHT URETEROSCOPY/HOLMIUM LASER WITH RIGHT STENT PLACEMENT;  Surgeon: Alexis Frock, MD;  Location: [facility];  Service: Urology;  Laterality: Right;   PARATHYROIDECTOMY     2-3 removed   REPLACEMENT TOTAL KNEE BILATERAL  1999, 2005   Right leg femur repaired     THYROIDECTOMY     TONSILLECTOMY     TUBAL LIGATION      Allergies  Allergen Reactions   Atorvastatin Nausea Only   Azithromycin Itching   Beta Adrenergic Blockers Other (See Comments)    Depression    Ciprofloxacin Other (See Comments)    Edgy and very jumpy     Codeine Nausea And Vomiting   Darvon [Propoxyphene] Nausea And Vomiting   Epinephrine Other (See Comments)    Rapid pulse, sweats   Klonopin [Clonazepam] Other (See Comments)    Makes her feel very suicidal    Oxycodone Other (See Comments)    Patient felt like it altered her mental status "Crazy" . Hallucinations later as well.   Paxil [Paroxetine Hydrochloride] Other (See Comments)    Severe depression    Prednisone Other (See Comments)    "makes me feel really bad"   Propoxyphene Hcl Nausea And Vomiting   Tape Itching   Torsemide Other (See Comments)    Patient stated that she doesn't recall the reaction   Tramadol Other (See Comments)    Feels "jittery"   Vicodin [Hydrocodone-Acetaminophen] Other (See Comments)    Hallucinations   Meloxicam Diarrhea   Other Rash    Polyester sheets "cause me a rash"   Vancomycin Rash    Localized rash related to infusion rate    Allergies as of 05/16/2021       Reactions   Atorvastatin Nausea Only   Azithromycin Itching   Beta Adrenergic Blockers Other (See Comments)   Depression   Ciprofloxacin Other (See Comments)   Edgy and very jumpy    Codeine Nausea And Vomiting   Darvon [propoxyphene] Nausea And Vomiting   Epinephrine Other (See Comments)   Rapid pulse, sweats   Klonopin [clonazepam] Other (See Comments)   Makes her feel very suicidal     Oxycodone Other (See Comments)   Patient felt like it altered her mental status "Crazy" . Hallucinations later as well.   Paxil [paroxetine Hydrochloride] Other (See Comments)   Severe depression    Prednisone Other (See Comments)   "makes me feel really bad"   Propoxyphene Hcl Nausea And Vomiting   Tape Itching  Torsemide Other (See Comments)   Patient stated that she doesn't recall the reaction   Tramadol Other (See Comments)   Feels "jittery"   Vicodin [hydrocodone-acetaminophen] Other (See Comments)   Hallucinations   Meloxicam Diarrhea   Other Rash   Polyester sheets "cause me a rash"   Vancomycin Rash   Localized rash related to infusion rate        Medication List        Accurate as of May 16, 2021 11:59 PM. If you have any questions, ask your nurse or doctor.          acetaminophen 500 MG tablet Commonly known as: TYLENOL Take 1,000 mg by mouth 2 (two) times daily.   aspirin 81 MG tablet Take 81 mg by mouth daily.   benzonatate 200 MG capsule Commonly known as: TESSALON Take 200 mg by mouth 3 (three) times daily as needed for cough. Special Instructions: administer for cough   calcium-vitamin D 500-200 MG-UNIT tablet Commonly known as: OSCAL WITH D Take 1 tablet by mouth 3 (three) times daily as needed.   Citracal Petites/Vitamin D 200-6.25 MG-MCG Tabs Generic drug: Calcium Citrate-Vitamin D Take 1 tablet by mouth every morning.   cyanocobalamin 1000 MCG tablet Take 2,000 mcg by mouth daily.   HYDROcodone-acetaminophen 5-325 MG tablet Commonly known as: NORCO/VICODIN Take 0.5 tablets by mouth every 6 (six) hours as needed for moderate pain.   ipratropium-albuterol 0.5-2.5 (3) MG/3ML Soln Commonly known as: DUONEB Take 3 mLs by nebulization in the morning, at noon, in the evening, and at bedtime.   LORazepam 0.5 MG tablet Commonly known as: ATIVAN Take 0.5 mg by mouth 2 (two) times daily as needed.   melatonin 5 MG Tabs Take 1 tablet (5  mg total) by mouth at bedtime.   metolazone 2.5 MG tablet Commonly known as: ZAROXOLYN Special Instructions: take 1 tablet on Sun/Tues/Wed/Thu/Sat.   metolazone 2.5 MG tablet Commonly known as: ZAROXOLYN Special Instructions: Take 2 tablet on Mondays and Fridays   miconazole 2 % cream Commonly known as: MICOTIN Apply 1 application topically 2 (two) times daily as needed. Apply to groin area   morphine CONCENTRATE 10 mg / 0.5 ml concentrated solution Take 5 mg by mouth every 6 (six) hours as needed for severe pain. 100 mg/5 mL (20 mg/mL) 0.76m (524m   nystatin powder Commonly known as: MYCOSTATIN/NYSTOP Apply 1 application topically as needed.   omeprazole 20 MG capsule Commonly known as: PRILOSEC TAKE 1 CAPSULE TWICE DAILY BEFORE MEALS   OXYGEN Inhale 2 L into the lungs continuous. KEEP O2 SATS >= 90%. Every Shift   potassium chloride 10 MEQ tablet Commonly known as: KLOR-CON M Take 20 mEq by mouth 2 (two) times daily.   predniSONE 10 MG tablet Commonly known as: DELTASONE Take 10 mg by mouth daily.   QUEtiapine 25 MG tablet Commonly known as: SEROQUEL TAKE 1/2 TABLET AT BEDTIME.   QUEtiapine 25 MG tablet Commonly known as: SEROQUEL Take by mouth. Take 1/2 tablet every morning   saccharomyces boulardii 250 MG capsule Commonly known as: FLORASTOR Take 1 capsule (250 mg total) by mouth daily.   sertraline 100 MG tablet Commonly known as: ZOLOFT TAKE ONE TABLET BY MOUTH AT BEDTIME   spironolactone 25 MG tablet Commonly known as: ALDACTONE TAKE 1 TABLET ONCE DAILY.   Synthroid 137 MCG tablet Generic drug: levothyroxine TAKE 1 TABLET ONCE DAILY BEFORE BREAKFAST.   traZODone 50 MG tablet Commonly known as: DESYREL Take 25 mg by mouth  at bedtime as needed for sleep.        Review of Systems  Constitutional:  Positive for fatigue. Negative for appetite change and fever.  HENT:  Positive for hearing loss and rhinorrhea. Negative for congestion, sinus  pressure, sinus pain, sore throat and voice change.        Nasal discomfort/dry  Eyes:  Negative for pain, discharge, redness, itching and visual disturbance.       C/o dry eyes  Respiratory:  Positive for cough, shortness of breath and wheezing. Negative for choking.        Chronic hacking cough. DOE  Cardiovascular:  Positive for chest pain and leg swelling. Negative for palpitations.  Gastrointestinal:  Negative for abdominal pain and constipation.       Chronic, occasional diarrhea  Endocrine:       Get cold easily  Genitourinary:  Negative for dysuria, frequency, hematuria and urgency.       Chronic urinary leakage.   Musculoskeletal:  Positive for arthralgias and gait problem.       Pain in the tip of the left shoulder, worsens with pulling, pushing, or overhead movement.   Skin:  Negative for color change.  Neurological:  Negative for speech difficulty, weakness and headaches.  Psychiatric/Behavioral:  Positive for confusion. Negative for hallucinations and sleep disturbance. The patient is not nervous/anxious.    Immunization History  Administered Date(s) Administered   Influenza Split 03/20/2011   Influenza Whole 03/19/2009, 03/19/2012   Influenza, High Dose Seasonal PF 03/16/2018, 04/20/2019, 03/31/2020   Influenza,inj,Quad PF,6+ Mos 03/19/2013   Influenza-Unspecified 03/03/2014, 03/15/2015, 03/30/2016, 04/05/2017, 04/06/2021   Meningococcal Polysaccharide 03/15/2015   Moderna Sars-Covid-2 Vaccination 06/23/2019, 07/21/2019, 04/27/2020, 11/16/2020   PFIZER(Purple Top)SARS-COV-2 Vaccination 03/08/2021   PPD Test 03/30/2016   Pneumococcal Conjugate-13 03/12/2014   Pneumococcal Polysaccharide-23 06/19/2006, 03/15/2015   Td 10/04/2009   Tdap 06/19/2005, 11/14/2018   Zoster Recombinat (Shingrix) 01/28/2018, 04/22/2018, 05/22/2018   Zoster, Live 06/19/2010   Pertinent  Health Maintenance Due  Topic Date Due   URINE MICROALBUMIN  Never done   FOOT EXAM  06/19/2020    OPHTHALMOLOGY EXAM  07/20/2020   HEMOGLOBIN A1C  05/27/2021   INFLUENZA VACCINE  Completed   DEXA SCAN  Discontinued   Fall Risk 01/10/2021 01/17/2021 02/03/2021 02/16/2021 02/18/2021  Falls in the past year? - - - 0 -  Was there an injury with Fall? - - - - -  Fall Risk Category Calculator - - - - -  Fall Risk Category - - - - -  Patient Fall Risk Level High fall risk Moderate fall risk High fall risk Low fall risk Moderate fall risk  Patient at Risk for Falls Due to - - - - -  Fall risk Follow up - - - Falls evaluation completed -   Functional Status Survey:    Vitals:   05/16/21 1501  BP: 130/74  Pulse: 98  Resp: 20  Temp: (!) 97.5 F (36.4 C)  SpO2: 91%   There is no height or weight on file to calculate BMI. Physical Exam Vitals and nursing note reviewed.  Constitutional:      Comments: At her usual state of health.   HENT:     Head: Normocephalic and atraumatic.     Nose: Congestion and rhinorrhea present.     Mouth/Throat:     Mouth: Mucous membranes are moist.  Eyes:     General:        Right eye: No discharge.  Left eye: Discharge present.    Extraocular Movements: Extraocular movements intact.     Conjunctiva/sclera: Conjunctivae normal.     Pupils: Pupils are equal, round, and reactive to light.  Cardiovascular:     Rate and Rhythm: Normal rate and regular rhythm.     Heart sounds: No murmur heard. Pulmonary:     Breath sounds: Wheezing and rales present. No rhonchi.     Comments: Uses O2. C/o pain 7-8/10 left upper chest/shoulder region.  Abdominal:     General: Bowel sounds are normal.     Palpations: Abdomen is soft.     Tenderness: There is no abdominal tenderness.  Genitourinary:    Comments: Small external hemorrhoids 5-6pm previously.  Musculoskeletal:     Cervical back: Normal range of motion and neck supple.     Right lower leg: Edema present.     Left lower leg: Edema present.     Comments: Trace edema BLE.   Skin:    General: Skin is  warm and dry.     Comments: Brownish venous insufficiency skin changes BLE  Neurological:     General: No focal deficit present.     Mental Status: She is alert and oriented to person, place, and time. Mental status is at baseline.     Gait: Gait abnormal.  Psychiatric:        Mood and Affect: Mood normal.        Behavior: Behavior normal.        Thought Content: Thought content normal.    Labs reviewed: Recent Labs    01/06/21 0524 01/17/21 0737 02/18/21 0539  NA 140 136 140  K 3.7 3.7 3.5  CL 94* 95* 96*  CO2 35* 33* 32  GLUCOSE 89 102* 115*  BUN '17 14 20  ' CREATININE 0.70 0.90 0.98  CALCIUM 8.9 8.9 8.7*   Recent Labs    11/25/20 0740 01/06/21 0524 02/18/21 0539  AST 12 12* 16  ALT '7 11 14  ' ALKPHOS  --  69 67  BILITOT 0.4 0.7 0.5  PROT 6.2 6.1* 6.4*  ALBUMIN  --  3.0* 3.3*   Recent Labs    11/25/20 0740 01/01/21 0157 01/05/21 0406 01/06/21 0524 01/09/21 0600 01/17/21 0737 02/18/21 0539  WBC 6.7 8.0 7.3   < > 6.5 8.4 8.8  NEUTROABS 4,456 5.7 5.3  --   --   --   --   HGB 13.3 13.9 12.9   < > 13.8 15.0 12.6  HCT 41.2 44.7 41.5   < > 44.3 46.8* 40.2  MCV 87.7 89.8 91.6   < > 89.3 89.1 91.0  PLT 116* 129* 118*   < > 140* PLATELET CLUMPS NOTED ON SMEAR, UNABLE TO ESTIMATE 114*   < > = values in this interval not displayed.   Lab Results  Component Value Date   TSH 2.21 02/19/2021   Lab Results  Component Value Date   HGBA1C 6.1 (H) 11/25/2020   Lab Results  Component Value Date   CHOL 145 04/01/2020   HDL 56 04/01/2020   LDLCALC 71 04/01/2020   LDLDIRECT 99.4 03/13/2012   TRIG 95 04/01/2020   CHOLHDL 2.6 04/01/2020    Significant Diagnostic Results in last 30 days:  No results found.  Assessment/Plan: Bilateral dry eyes Artificial tears OU tid.   Allergic rhinitis Flonase bid for now.   Mass of upper lobe of left lung Lung mass, SOB, s/p  radiation tx, refused biopsy, Oncology/Pulmonology no further workups,  left upper lobe mass CT  angio chest 01/17/21-02/18/21 Left lung cancer with decreasing soft tissue mass when compared to CT 1 month ago. The left upper lobe remains collapsed/obstructed.. C/o left upper chest/shoulder region pain, Tylenol, Norco, under Hospice service. On Prednisone, O2.   Cough  Chronic bronchitis, takes Prednisone, Neb, O2, Tessalon   Chronic diastolic CHF (congestive heart failure) (HCC) Hx of chronic edema BLE/CHF, takes Metolazone, Spironolactone daily, BNP 59.5 01/17/21. Bun/creat 16/1.0, eGFR 54 05/10/21  GERD (gastroesophageal reflux disease) takes Omeprazole, Hgb 12.3 05/10/21  Hypothyroidism (acquired) , takes Synthroid, TSH 2.21 02/19/21  Hallucination, visual  takes Quetiapine, MRI showed atrophy  Depression with anxiety Her mood is stable,  takes Sertraline, Trazodone, Lorazepam   Vitamin B12 deficiency supplemented with Vit B12. Hgb 12.3 05/10/21  Prediabetes Diet controlled.   Vascular dementia ([place])  off Memantine, on ASA 58m qd, baseline mentation.   Essential hypertension Blood pressure is controlled, continue Spironolactone, Metolazone.    Family/ staff Communication: plan of care reviewed with the patient and charge nurse.   Labs/tests ordered:  none  Time spend 35 minutes.

## 2021-05-16 NOTE — Assessment & Plan Note (Signed)
takes Quetiapine, MRI showed atrophy

## 2021-05-16 NOTE — Assessment & Plan Note (Signed)
supplemented with Vit B12. Hgb 12.3 05/10/21

## 2021-05-16 NOTE — Assessment & Plan Note (Signed)
Her mood is stable,  takes Sertraline, Trazodone, Lorazepam

## 2021-05-16 NOTE — Assessment & Plan Note (Signed)
Blood pressure is controlled, continue Spironolactone, Metolazone.

## 2021-05-16 NOTE — Assessment & Plan Note (Signed)
takes Omeprazole, Hgb 12.3 05/10/21

## 2021-05-16 NOTE — Assessment & Plan Note (Signed)
off Memantine, on ASA $Remo'81mg'TVpXM$  qd, baseline mentation.

## 2021-05-16 NOTE — Assessment & Plan Note (Signed)
Diet controlled.  

## 2021-05-16 NOTE — Assessment & Plan Note (Signed)
Hx of chronic edema BLE/CHF, takes Metolazone, Spironolactone daily, BNP 59.5 01/17/21. Bun/creat 16/1.0, eGFR 54 05/10/21

## 2021-05-16 NOTE — Assessment & Plan Note (Signed)
,   takes Synthroid, TSH 2.21 02/19/21

## 2021-05-16 NOTE — Assessment & Plan Note (Signed)
Chronic bronchitis, takes Prednisone, Neb, O2, Tessalon

## 2021-05-16 NOTE — Assessment & Plan Note (Signed)
Artificial tears OU tid.

## 2021-05-16 NOTE — Assessment & Plan Note (Signed)
Flonase bid for now.

## 2021-05-17 ENCOUNTER — Encounter: Payer: Self-pay | Admitting: Nurse Practitioner

## 2021-05-19 DIAGNOSIS — K589 Irritable bowel syndrome without diarrhea: Secondary | ICD-10-CM | POA: Diagnosis not present

## 2021-05-19 DIAGNOSIS — E1122 Type 2 diabetes mellitus with diabetic chronic kidney disease: Secondary | ICD-10-CM | POA: Diagnosis not present

## 2021-05-19 DIAGNOSIS — N183 Chronic kidney disease, stage 3 unspecified: Secondary | ICD-10-CM | POA: Diagnosis not present

## 2021-05-19 DIAGNOSIS — E785 Hyperlipidemia, unspecified: Secondary | ICD-10-CM | POA: Diagnosis not present

## 2021-05-19 DIAGNOSIS — E039 Hypothyroidism, unspecified: Secondary | ICD-10-CM | POA: Diagnosis not present

## 2021-05-19 DIAGNOSIS — J302 Other seasonal allergic rhinitis: Secondary | ICD-10-CM | POA: Diagnosis not present

## 2021-05-19 DIAGNOSIS — Z6838 Body mass index (BMI) 38.0-38.9, adult: Secondary | ICD-10-CM | POA: Diagnosis not present

## 2021-05-19 DIAGNOSIS — F419 Anxiety disorder, unspecified: Secondary | ICD-10-CM | POA: Diagnosis not present

## 2021-05-19 DIAGNOSIS — F0150 Vascular dementia without behavioral disturbance: Secondary | ICD-10-CM | POA: Diagnosis not present

## 2021-05-19 DIAGNOSIS — Z853 Personal history of malignant neoplasm of breast: Secondary | ICD-10-CM | POA: Diagnosis not present

## 2021-05-19 DIAGNOSIS — C3492 Malignant neoplasm of unspecified part of left bronchus or lung: Secondary | ICD-10-CM | POA: Diagnosis not present

## 2021-05-19 DIAGNOSIS — F32A Depression, unspecified: Secondary | ICD-10-CM | POA: Diagnosis not present

## 2021-05-19 DIAGNOSIS — Z87891 Personal history of nicotine dependence: Secondary | ICD-10-CM | POA: Diagnosis not present

## 2021-05-19 DIAGNOSIS — I5032 Chronic diastolic (congestive) heart failure: Secondary | ICD-10-CM | POA: Diagnosis not present

## 2021-05-19 DIAGNOSIS — K219 Gastro-esophageal reflux disease without esophagitis: Secondary | ICD-10-CM | POA: Diagnosis not present

## 2021-05-19 DIAGNOSIS — I130 Hypertensive heart and chronic kidney disease with heart failure and stage 1 through stage 4 chronic kidney disease, or unspecified chronic kidney disease: Secondary | ICD-10-CM | POA: Diagnosis not present

## 2021-05-23 DIAGNOSIS — C3492 Malignant neoplasm of unspecified part of left bronchus or lung: Secondary | ICD-10-CM | POA: Diagnosis not present

## 2021-05-23 DIAGNOSIS — N183 Chronic kidney disease, stage 3 unspecified: Secondary | ICD-10-CM | POA: Diagnosis not present

## 2021-05-23 DIAGNOSIS — E1122 Type 2 diabetes mellitus with diabetic chronic kidney disease: Secondary | ICD-10-CM | POA: Diagnosis not present

## 2021-05-23 DIAGNOSIS — I130 Hypertensive heart and chronic kidney disease with heart failure and stage 1 through stage 4 chronic kidney disease, or unspecified chronic kidney disease: Secondary | ICD-10-CM | POA: Diagnosis not present

## 2021-05-23 DIAGNOSIS — I5032 Chronic diastolic (congestive) heart failure: Secondary | ICD-10-CM | POA: Diagnosis not present

## 2021-05-23 DIAGNOSIS — F0150 Vascular dementia without behavioral disturbance: Secondary | ICD-10-CM | POA: Diagnosis not present

## 2021-05-25 DIAGNOSIS — I5032 Chronic diastolic (congestive) heart failure: Secondary | ICD-10-CM | POA: Diagnosis not present

## 2021-05-25 DIAGNOSIS — N183 Chronic kidney disease, stage 3 unspecified: Secondary | ICD-10-CM | POA: Diagnosis not present

## 2021-05-25 DIAGNOSIS — C3492 Malignant neoplasm of unspecified part of left bronchus or lung: Secondary | ICD-10-CM | POA: Diagnosis not present

## 2021-05-25 DIAGNOSIS — E1122 Type 2 diabetes mellitus with diabetic chronic kidney disease: Secondary | ICD-10-CM | POA: Diagnosis not present

## 2021-05-25 DIAGNOSIS — I130 Hypertensive heart and chronic kidney disease with heart failure and stage 1 through stage 4 chronic kidney disease, or unspecified chronic kidney disease: Secondary | ICD-10-CM | POA: Diagnosis not present

## 2021-05-25 DIAGNOSIS — F0150 Vascular dementia without behavioral disturbance: Secondary | ICD-10-CM | POA: Diagnosis not present

## 2021-05-26 DIAGNOSIS — I130 Hypertensive heart and chronic kidney disease with heart failure and stage 1 through stage 4 chronic kidney disease, or unspecified chronic kidney disease: Secondary | ICD-10-CM | POA: Diagnosis not present

## 2021-05-26 DIAGNOSIS — N183 Chronic kidney disease, stage 3 unspecified: Secondary | ICD-10-CM | POA: Diagnosis not present

## 2021-05-26 DIAGNOSIS — E1122 Type 2 diabetes mellitus with diabetic chronic kidney disease: Secondary | ICD-10-CM | POA: Diagnosis not present

## 2021-05-26 DIAGNOSIS — I5032 Chronic diastolic (congestive) heart failure: Secondary | ICD-10-CM | POA: Diagnosis not present

## 2021-05-26 DIAGNOSIS — F0150 Vascular dementia without behavioral disturbance: Secondary | ICD-10-CM | POA: Diagnosis not present

## 2021-05-26 DIAGNOSIS — C3492 Malignant neoplasm of unspecified part of left bronchus or lung: Secondary | ICD-10-CM | POA: Diagnosis not present

## 2021-05-27 ENCOUNTER — Encounter: Payer: Self-pay | Admitting: Nurse Practitioner

## 2021-05-27 ENCOUNTER — Non-Acute Institutional Stay (SKILLED_NURSING_FACILITY): Payer: Medicare Other | Admitting: Nurse Practitioner

## 2021-05-27 DIAGNOSIS — N183 Chronic kidney disease, stage 3 unspecified: Secondary | ICD-10-CM | POA: Diagnosis not present

## 2021-05-27 DIAGNOSIS — J9611 Chronic respiratory failure with hypoxia: Secondary | ICD-10-CM

## 2021-05-27 DIAGNOSIS — E1122 Type 2 diabetes mellitus with diabetic chronic kidney disease: Secondary | ICD-10-CM | POA: Diagnosis not present

## 2021-05-27 DIAGNOSIS — F01A4 Vascular dementia, mild, with anxiety: Secondary | ICD-10-CM

## 2021-05-27 DIAGNOSIS — K219 Gastro-esophageal reflux disease without esophagitis: Secondary | ICD-10-CM | POA: Diagnosis not present

## 2021-05-27 DIAGNOSIS — I130 Hypertensive heart and chronic kidney disease with heart failure and stage 1 through stage 4 chronic kidney disease, or unspecified chronic kidney disease: Secondary | ICD-10-CM | POA: Diagnosis not present

## 2021-05-27 DIAGNOSIS — I5032 Chronic diastolic (congestive) heart failure: Secondary | ICD-10-CM | POA: Diagnosis not present

## 2021-05-27 DIAGNOSIS — F418 Other specified anxiety disorders: Secondary | ICD-10-CM

## 2021-05-27 DIAGNOSIS — C3492 Malignant neoplasm of unspecified part of left bronchus or lung: Secondary | ICD-10-CM | POA: Diagnosis not present

## 2021-05-27 DIAGNOSIS — R441 Visual hallucinations: Secondary | ICD-10-CM

## 2021-05-27 DIAGNOSIS — R918 Other nonspecific abnormal finding of lung field: Secondary | ICD-10-CM | POA: Diagnosis not present

## 2021-05-27 DIAGNOSIS — I10 Essential (primary) hypertension: Secondary | ICD-10-CM

## 2021-05-27 DIAGNOSIS — E039 Hypothyroidism, unspecified: Secondary | ICD-10-CM

## 2021-05-27 DIAGNOSIS — F0150 Vascular dementia without behavioral disturbance: Secondary | ICD-10-CM | POA: Diagnosis not present

## 2021-05-27 NOTE — Progress Notes (Signed)
Location:   [facility] Room Number: 35 A Place of Service:  SNF (31) Provider:  Amand Lemoine X, NP  Virgie Dad, MD  Patient Care Team: Virgie Dad, MD as PCP - General (Internal Medicine) Minus Breeding, MD as PCP - Cardiology (Cardiology) Azucena Fallen, MD as Consulting Physician (Obstetrics and Gynecology) Chesley Mires, MD as Consulting Physician (Pulmonary Disease) Dolly Harbach X, NP as Nurse Practitioner (Internal Medicine) Christin Fudge, MD (Inactive) as Consulting Physician (Surgery)  Extended Emergency Contact Information Primary Emergency Contact: Boxman,Lee Address: [address]          [place], [place] [address] Johnnette Litter of [place] Phone: [phone] Relation: Daughter Secondary Emergency Contact: Ritta Slot [place] of [place] Mobile Phone: [phone] Relation: Daughter  Code Status:  DNA Goals of care: Advanced Directive information Advanced Directives 05/27/2021  Does Patient Have a Medical Advance Directive? Yes  Type of Paramedic of [place];Living will;Out of facility DNR (pink MOST or yellow form)  Does patient want to make changes to medical advance directive? No - Patient declined  Copy of [place] in Chart? Yes - validated most recent copy scanned in chart (See row information)  Would patient like information on creating a medical advance directive? -  Pre-existing out of facility DNR order (yellow form or pink MOST form) Yellow form placed in chart (order not valid for inpatient use);Pink MOST form placed in chart (order not valid for inpatient use)     Chief Complaint  Patient presents with  . Medical Management of Chronic Issues    Routine visit  . Quality Metric Gaps    Urine Microalbumin, Foot exam, Eye exam, HGB A1C    HPI:  Pt is a 87 y.o. female seen today for medical management of chronic diseases.      Lung mass, SOB, s/p  radiation tx, refused  biopsy, Oncology/Pulmonology no further workups,  left upper lobe mass CT angio chest 01/17/21-02/18/21 Left lung cancer with decreasing soft tissue mass when compared to CT 1 month ago. The left upper lobe remains collapsed/obstructed.. C/o left upper chest/shoulder region pain, Tylenol, Norco, under Hospice service. On Prednisone, O2. started Morphine, Lorazepam for comfort measures.              Hx of chronic rhinitis, dry eyes.              Chronic bronchitis, takes Prednisone, Neb, O2, Tessalon              Hx of chronic edema BLE/CHF, not apparent since oral intake decreased, will dc Metolazone, Spironolactone for now.  BNP 59.5 01/17/21. Bun/creat 16/1.0, eGFR 54 05/10/21             GERD, takes Omeprazole, Hgb 12.3 05/10/21             Hyperlipidemia, off statin             Hypothyroidism, takes Synthroid, TSH 2.21 02/19/21             OSA, CPAP, O2 via [place]             Visual hallucination, takes Quetiapine, MRI showed atrophy, agitation noted, added Haldol bid.              Depression/anxiety, takes Sertraline, Trazodone, Lorazepam              Vit B12, supplemented with Vit B12. Hgb 12.3 05/10/21  Dementia, off Memantine, on ASA 3m qd, progressing.             Prediabetes, Hgb a1c. 6.1 11/25/20             HTN, controlled, no meds.     Past Medical History:  Diagnosis Date  . Altered mental status   . ARTHRITIS, KNEES, BILATERAL   . Breast CA ([facility] 2000   s/p R lumpectomy and XRT  . CHF (congestive heart failure) ([facility]   . DEPRESSION   . DJD (degenerative joint disease) of knee   . GERD   . HYPERLIPIDEMIA   . HYPERTENSION   . HYPOTHYROIDISM    postsurgical  . Hypoxia   . MIGRAINE HEADACHE   . Morbid [facility] ([facility]   . OSA (obstructive sleep apnea) 01/17/2011 dx  . Oxygen dependent   . Urge incontinence   . UTI (lower urinary tract infection) 12/2014   Past Surgical History:  Procedure Laterality Date  . ABDOMINAL HYSTERECTOMY    . ADENOIDECTOMY    . BREAST BIOPSY   2000  . BREAST LUMPECTOMY  2000  . CATARACT EXTRACTION    . CHOLECYSTECTOMY  1995  . CYSTOSCOPY/URETEROSCOPY/HOLMIUM LASER Right 06/28/2015   Procedure: CYSTOSCOPY RIGHT RETROGRAD RIGHT URETEROSCOPY/HOLMIUM LASER WITH RIGHT STENT PLACEMENT;  Surgeon: TAlexis Frock MD;  Location: [facility];  Service: Urology;  Laterality: Right;  . PARATHYROIDECTOMY     2-3 removed  . REPLACEMENT TOTAL KNEE BILATERAL  1999, 2005  . Right leg femur repaired    . THYROIDECTOMY    . TONSILLECTOMY    . TUBAL LIGATION      Allergies  Allergen Reactions  . Atorvastatin Nausea Only  . Azithromycin Itching  . Beta Adrenergic Blockers Other (See Comments)    Depression   . Ciprofloxacin Other (See Comments)    Edgy and very jumpy    . Codeine Nausea And Vomiting  . Darvon [Propoxyphene] Nausea And Vomiting  . Epinephrine Other (See Comments)    Rapid pulse, sweats  . Klonopin [Clonazepam] Other (See Comments)    Makes her feel very suicidal   . Oxycodone Other (See Comments)    Patient felt like it altered her mental status "Crazy" . Hallucinations later as well.  .Marland KitchenPaxil [Paroxetine Hydrochloride] Other (See Comments)    Severe depression   . Prednisone Other (See Comments)    "makes me feel really bad"  . Propoxyphene Hcl Nausea And Vomiting  . Tape Itching  . Torsemide Other (See Comments)    Patient stated that she doesn't recall the reaction  . Tramadol Other (See Comments)    Feels "jittery"  . Vicodin [Hydrocodone-Acetaminophen] Other (See Comments)    Hallucinations  . Meloxicam Diarrhea  . Other Rash    Polyester sheets "cause me a rash"  . Vancomycin Rash    Localized rash related to infusion rate    Allergies as of 05/27/2021       Reactions   Atorvastatin Nausea Only   Azithromycin Itching   Beta Adrenergic Blockers Other (See Comments)   Depression   Ciprofloxacin Other (See Comments)   Edgy and very jumpy    Codeine Nausea And Vomiting   Darvon [propoxyphene] Nausea And  Vomiting   Epinephrine Other (See Comments)   Rapid pulse, sweats   Klonopin [clonazepam] Other (See Comments)   Makes her feel very suicidal    Oxycodone Other (See Comments)   Patient felt like it altered her mental status "Crazy" . Hallucinations later  as well.   Paxil [paroxetine Hydrochloride] Other (See Comments)   Severe depression    Prednisone Other (See Comments)   "makes me feel really bad"   Propoxyphene Hcl Nausea And Vomiting   Tape Itching   Torsemide Other (See Comments)   Patient stated that she doesn't recall the reaction   Tramadol Other (See Comments)   Feels "jittery"   Vicodin [hydrocodone-acetaminophen] Other (See Comments)   Hallucinations   Meloxicam Diarrhea   Other Rash   Polyester sheets "cause me a rash"   Vancomycin Rash   Localized rash related to infusion rate        Medication List        Accurate as of May 27, 2021 11:59 PM. If you have any questions, ask your nurse or doctor.          acetaminophen 500 MG tablet Commonly known as: TYLENOL Take 1,000 mg by mouth 2 (two) times daily.   antiseptic oral rinse Liqd 15 mLs by Mouth Rinse route in the morning and at bedtime.   aspirin 81 MG tablet Take 81 mg by mouth daily.   benzonatate 200 MG capsule Commonly known as: TESSALON Take 200 mg by mouth 3 (three) times daily as needed for cough. Special Instructions: administer for cough   calcium-vitamin D 500-200 MG-UNIT tablet Commonly known as: OSCAL WITH D Take 1 tablet by mouth 3 (three) times daily as needed.   Citracal Petites/Vitamin D 200-6.25 MG-MCG Tabs Generic drug: Calcium Citrate-Vitamin D Take 1 tablet by mouth every morning.   cyanocobalamin 1000 MCG tablet Take 2,000 mcg by mouth daily.   fluticasone 50 MCG/ACT nasal spray Commonly known as: FLONASE Place 1 spray into both nostrils daily.   HYDROcodone-acetaminophen 5-325 MG tablet Commonly known as: NORCO/VICODIN Take 0.5 tablets by mouth every 6  (six) hours as needed for moderate pain.   hydroxypropyl methylcellulose / hypromellose 2.5 % ophthalmic solution Commonly known as: ISOPTO TEARS / GONIOVISC Place 2 drops into both eyes 3 (three) times daily.   ipratropium-albuterol 0.5-2.5 (3) MG/3ML Soln Commonly known as: DUONEB Take 3 mLs by nebulization in the morning, at noon, in the evening, and at bedtime.   LORazepam 0.5 MG tablet Commonly known as: ATIVAN Take 0.5 mg by mouth every 4 (four) hours.   melatonin 5 MG Tabs Take 1 tablet (5 mg total) by mouth at bedtime.   metolazone 2.5 MG tablet Commonly known as: ZAROXOLYN Special Instructions: take 1 tablet on Sun/Tues/Wed/Thu/Sat.   metolazone 2.5 MG tablet Commonly known as: ZAROXOLYN Special Instructions: Take 2 tablet on Mondays and Fridays   miconazole 2 % cream Commonly known as: MICOTIN Apply 1 application topically 2 (two) times daily as needed. Apply to groin area   morphine 20 MG/ML concentrated solution Commonly known as: ROXANOL Take 5 mg by mouth every 4 (four) hours. What changed: Another medication with the same name was removed. Continue taking this medication, and follow the directions you see here. Changed by: Layal Javid X Ted Leonhart, NP   morphine 20 MG/ML concentrated solution Commonly known as: ROXANOL Take 5 mg by mouth every 2 (two) hours as needed for severe pain. What changed: Another medication with the same name was removed. Continue taking this medication, and follow the directions you see here. Changed by: Imad Shostak X Tamerra Merkley, NP   nystatin powder Commonly known as: MYCOSTATIN/NYSTOP Apply 1 application topically as needed.   omeprazole 20 MG capsule Commonly known as: PRILOSEC TAKE 1 CAPSULE TWICE DAILY BEFORE MEALS  OXYGEN Inhale 2 L into the lungs continuous. KEEP O2 SATS >= 90%. Every Shift   potassium chloride 10 MEQ tablet Commonly known as: KLOR-CON M Take 20 mEq by mouth 2 (two) times daily.   predniSONE 10 MG tablet Commonly known  as: DELTASONE Take 10 mg by mouth daily.   QUEtiapine 25 MG tablet Commonly known as: SEROQUEL TAKE 1/2 TABLET AT BEDTIME.   QUEtiapine 25 MG tablet Commonly known as: SEROQUEL Take by mouth. Take 1/2 tablet every morning   saccharomyces boulardii 250 MG capsule Commonly known as: FLORASTOR Take 1 capsule (250 mg total) by mouth daily.   sertraline 100 MG tablet Commonly known as: ZOLOFT TAKE ONE TABLET BY MOUTH AT BEDTIME   spironolactone 25 MG tablet Commonly known as: ALDACTONE TAKE 1 TABLET ONCE DAILY.   Synthroid 137 MCG tablet Generic drug: levothyroxine TAKE 1 TABLET ONCE DAILY BEFORE BREAKFAST.   traZODone 50 MG tablet Commonly known as: DESYREL Take 25 mg by mouth at bedtime as needed for sleep.        Review of Systems  Constitutional:  Positive for activity change, appetite change and fatigue. Negative for fever.  HENT:  Positive for hearing loss and rhinorrhea. Negative for congestion.        Nasal discomfort/dry  Eyes:  Negative for visual disturbance.  Respiratory:  Positive for cough, shortness of breath and wheezing.        Chronic hacking cough, SOB  Cardiovascular:  Negative for leg swelling.  Gastrointestinal:  Negative for abdominal pain and constipation.       Chronic, occasional diarrhea  Endocrine:       Get cold easily  Genitourinary:  Negative for dysuria.       Chronic urinary leakage.   Musculoskeletal:  Positive for arthralgias and gait problem.  Skin:  Negative for color change.  Neurological:  Negative for speech difficulty, weakness and headaches.  Psychiatric/Behavioral:  Positive for agitation, behavioral problems and confusion. Negative for hallucinations.    Immunization History  Administered Date(s) Administered  . Influenza Split 03/20/2011  . Influenza Whole 03/19/2009, 03/19/2012  . Influenza, High Dose Seasonal PF 03/16/2018, 04/20/2019, 03/31/2020  . Influenza,inj,Quad PF,6+ Mos 03/19/2013  . Influenza-Unspecified  03/03/2014, 03/15/2015, 03/30/2016, 04/05/2017, 04/06/2021  . Meningococcal Polysaccharide 03/15/2015  . Moderna Sars-Covid-2 Vaccination 06/23/2019, 07/21/2019, 04/27/2020, 11/16/2020  . PFIZER(Purple Top)SARS-COV-2 Vaccination 03/08/2021  . PPD Test 03/30/2016  . Pneumococcal Conjugate-13 03/12/2014  . Pneumococcal Polysaccharide-23 06/19/2006, 03/15/2015  . Td 10/04/2009  . Tdap 06/19/2005, 11/14/2018  . Zoster Recombinat (Shingrix) 01/28/2018, 04/22/2018, 05/22/2018  . Zoster, Live 06/19/2010   Pertinent  Health Maintenance Due  Topic Date Due  . URINE MICROALBUMIN  Never done  . FOOT EXAM  06/19/2020  . OPHTHALMOLOGY EXAM  07/20/2020  . HEMOGLOBIN A1C  05/27/2021  . INFLUENZA VACCINE  Completed  . DEXA SCAN  Discontinued   Fall Risk 01/10/2021 01/17/2021 02/03/2021 02/16/2021 02/18/2021  Falls in the past year? - - - 0 -  Was there an injury with Fall? - - - - -  Fall Risk Category Calculator - - - - -  Fall Risk Category - - - - -  Patient Fall Risk Level High fall risk Moderate fall risk High fall risk Low fall risk Moderate fall risk  Patient at Risk for Falls Due to - - - - -  Fall risk Follow up - - - Falls evaluation completed -   Functional Status Survey:    Vitals:   05/27/21 1542  BP: 126/60  Pulse: 76  Resp: 20  Temp: (!) 97.1 F (36.2 C)  SpO2: 91%  Weight: 253 lb 12.8 oz (115.1 kg)  Height: '5\' 6"'  (1.676 m)   Body mass index is 40.96 kg/m. Physical Exam Vitals and nursing note reviewed.  Constitutional:      Comments: Lethargic.   HENT:     Head: Normocephalic and atraumatic.     Nose: Nose normal.     Mouth/Throat:     Mouth: Mucous membranes are moist.  Eyes:     Extraocular Movements: Extraocular movements intact.     Conjunctiva/sclera: Conjunctivae normal.     Pupils: Pupils are equal, round, and reactive to light.  Cardiovascular:     Rate and Rhythm: Normal rate and regular rhythm.     Heart sounds: No murmur heard. Pulmonary:     Breath  sounds: Wheezing and rales present. No rhonchi.     Comments: Uses O2. C/o pain 7-8/10 left upper chest/shoulder region.  Abdominal:     General: Bowel sounds are normal.     Palpations: Abdomen is soft.     Tenderness: There is no abdominal tenderness.  Genitourinary:    Comments: Small external hemorrhoids 5-6pm previously.  Musculoskeletal:     Cervical back: Normal range of motion and neck supple.     Right lower leg: No edema.     Left lower leg: No edema.  Skin:    General: Skin is warm and dry.     Comments: Brownish venous insufficiency skin changes BLE  Neurological:     General: No focal deficit present.     Mental Status: She is alert.     Gait: Gait abnormal.  Psychiatric:     Comments: Lethargic, restless/agitated sometimes.     Labs reviewed: Recent Labs    01/06/21 0524 01/17/21 0737 02/18/21 0539 05/10/21 0000  NA 140 136 140 141  K 3.7 3.7 3.5 4.2  CL 94* 95* 96* 99  CO2 35* 33* 32 33*  GLUCOSE 89 102* 115*  --   BUN '17 14 20 16  ' CREATININE 0.70 0.90 0.98 1.0  CALCIUM 8.9 8.9 8.7* 8.9   Recent Labs    11/25/20 0740 01/06/21 0524 02/18/21 0539 05/10/21 0000  AST 12 12* 16 16  ALT '7 11 14 18  ' ALKPHOS  --  69 67 70  BILITOT 0.4 0.7 0.5  --   PROT 6.2 6.1* 6.4*  --   ALBUMIN  --  3.0* 3.3* 3.4*   Recent Labs    01/01/21 0157 01/05/21 0406 01/06/21 0524 01/09/21 0600 01/17/21 0737 02/18/21 0539 05/10/21 0000  WBC 8.0 7.3   < > 6.5 8.4 8.8 6.8  NEUTROABS 5.7 5.3  --   --   --   --  5,080.00  HGB 13.9 12.9   < > 13.8 15.0 12.6 12.3  HCT 44.7 41.5   < > 44.3 46.8* 40.2 38  MCV 89.8 91.6   < > 89.3 89.1 91.0  --   PLT 129* 118*   < > 140* PLATELET CLUMPS NOTED ON SMEAR, UNABLE TO ESTIMATE 114* 111*   < > = values in this interval not displayed.   Lab Results  Component Value Date   TSH 2.21 02/19/2021   Lab Results  Component Value Date   HGBA1C 6.1 (H) 11/25/2020   Lab Results  Component Value Date   CHOL 145 04/01/2020   HDL 56  04/01/2020   LDLCALC 71 04/01/2020  LDLDIRECT 99.4 03/13/2012   TRIG 95 04/01/2020   CHOLHDL 2.6 04/01/2020    Significant Diagnostic Results in last 30 days:  No results found.  Assessment/Plan Mass of upper lobe of left lung Lung mass, SOB, s/p  radiation tx, refused biopsy, Oncology/Pulmonology no further workups,  left upper lobe mass CT angio chest 01/17/21-02/18/21 Left lung cancer with decreasing soft tissue mass when compared to CT 1 month ago. The left upper lobe remains collapsed/obstructed.. C/o left upper chest/shoulder region pain, Tylenol, Norco, under Hospice service. On Prednisone, O2. started Morphine, Lorazepam for comfort measures.   Chronic respiratory failure with hypoxia (HCC)  Hx of chronic bronchitis, lung cancer, takes Prednisone, Neb, O2, Tessalon, Morphine, Lorazepam.   Chronic diastolic CHF (congestive heart failure) (HCC) Edema BLE are not apparent since oral intake decreased, will dc Metolazone, Spironolactone for now.  BNP 59.5 01/17/21. Bun/creat 16/1.0, eGFR 54 05/10/21  GERD (gastroesophageal reflux disease)  takes Omeprazole, Hgb 12.3 05/10/21  Hypothyroidism (acquired)  takes Synthroid, TSH 2.21 02/19/21  Visual hallucinations  Visual hallucination, takes Quetiapine, MRI showed atrophy, agitation noted, added Haldol bid.   Depression with anxiety takes Sertraline, Trazodone, Lorazepam   Essential hypertension Controlled, not on meds.   Vascular dementia ([place]) off Memantine, on ASA 73m qd, progressing. Noted agitation, Haldol 545mbid initiated.     Family/ staff Communication: plan of care reviewed with the patient, the patient's HPOA, charge nurse.   Labs/tests ordered:  none  Time spend 35 minutes.

## 2021-05-28 DIAGNOSIS — E1122 Type 2 diabetes mellitus with diabetic chronic kidney disease: Secondary | ICD-10-CM | POA: Diagnosis not present

## 2021-05-28 DIAGNOSIS — C3492 Malignant neoplasm of unspecified part of left bronchus or lung: Secondary | ICD-10-CM | POA: Diagnosis not present

## 2021-05-28 DIAGNOSIS — N183 Chronic kidney disease, stage 3 unspecified: Secondary | ICD-10-CM | POA: Diagnosis not present

## 2021-05-28 DIAGNOSIS — F0150 Vascular dementia without behavioral disturbance: Secondary | ICD-10-CM | POA: Diagnosis not present

## 2021-05-28 DIAGNOSIS — I130 Hypertensive heart and chronic kidney disease with heart failure and stage 1 through stage 4 chronic kidney disease, or unspecified chronic kidney disease: Secondary | ICD-10-CM | POA: Diagnosis not present

## 2021-05-28 DIAGNOSIS — I5032 Chronic diastolic (congestive) heart failure: Secondary | ICD-10-CM | POA: Diagnosis not present

## 2021-05-29 DIAGNOSIS — I5032 Chronic diastolic (congestive) heart failure: Secondary | ICD-10-CM | POA: Diagnosis not present

## 2021-05-29 DIAGNOSIS — C3492 Malignant neoplasm of unspecified part of left bronchus or lung: Secondary | ICD-10-CM | POA: Diagnosis not present

## 2021-05-29 DIAGNOSIS — E1122 Type 2 diabetes mellitus with diabetic chronic kidney disease: Secondary | ICD-10-CM | POA: Diagnosis not present

## 2021-05-29 DIAGNOSIS — N183 Chronic kidney disease, stage 3 unspecified: Secondary | ICD-10-CM | POA: Diagnosis not present

## 2021-05-29 DIAGNOSIS — I130 Hypertensive heart and chronic kidney disease with heart failure and stage 1 through stage 4 chronic kidney disease, or unspecified chronic kidney disease: Secondary | ICD-10-CM | POA: Diagnosis not present

## 2021-05-29 DIAGNOSIS — F0150 Vascular dementia without behavioral disturbance: Secondary | ICD-10-CM | POA: Diagnosis not present

## 2021-05-30 ENCOUNTER — Encounter: Payer: Self-pay | Admitting: Nurse Practitioner

## 2021-05-30 DIAGNOSIS — I5032 Chronic diastolic (congestive) heart failure: Secondary | ICD-10-CM | POA: Diagnosis not present

## 2021-05-30 DIAGNOSIS — F0150 Vascular dementia without behavioral disturbance: Secondary | ICD-10-CM | POA: Diagnosis not present

## 2021-05-30 DIAGNOSIS — I130 Hypertensive heart and chronic kidney disease with heart failure and stage 1 through stage 4 chronic kidney disease, or unspecified chronic kidney disease: Secondary | ICD-10-CM | POA: Diagnosis not present

## 2021-05-30 DIAGNOSIS — C3492 Malignant neoplasm of unspecified part of left bronchus or lung: Secondary | ICD-10-CM | POA: Diagnosis not present

## 2021-05-30 DIAGNOSIS — E1122 Type 2 diabetes mellitus with diabetic chronic kidney disease: Secondary | ICD-10-CM | POA: Diagnosis not present

## 2021-05-30 DIAGNOSIS — N183 Chronic kidney disease, stage 3 unspecified: Secondary | ICD-10-CM | POA: Diagnosis not present

## 2021-05-30 NOTE — Assessment & Plan Note (Signed)
off Memantine, on ASA $Remo'81mg'ZbSsV$  qd, progressing. Noted agitation, Haldol $RemoveBeforeDEI'5mg'[id number]$  bid initiated.

## 2021-05-30 NOTE — Assessment & Plan Note (Signed)
Lung mass, SOB, s/p  radiation tx, refused biopsy, Oncology/Pulmonology no further workups,  left upper lobe mass CT angio chest 01/17/21-02/18/21 Left lung cancer with decreasing soft tissue mass when compared to CT 1 month ago. The left upper lobe remains collapsed/obstructed.. C/o left upper chest/shoulder region pain, Tylenol, Norco, under Hospice service. On Prednisone, O2. started Morphine, Lorazepam for comfort measures.

## 2021-05-30 NOTE — Assessment & Plan Note (Signed)
takes Synthroid, TSH 2.21 02/19/21

## 2021-05-30 NOTE — Assessment & Plan Note (Signed)
Visual hallucination, takes Quetiapine, MRI showed atrophy, agitation noted, added Haldol bid.

## 2021-05-30 NOTE — Assessment & Plan Note (Signed)
Controlled, not on meds.

## 2021-05-30 NOTE — Assessment & Plan Note (Signed)
takes Omeprazole, Hgb 12.3 05/10/21

## 2021-05-30 NOTE — Assessment & Plan Note (Signed)
Edema BLE are not apparent since oral intake decreased, will dc Metolazone, Spironolactone for now.  BNP 59.5 01/17/21. Bun/creat 16/1.0, eGFR 54 05/10/21

## 2021-05-30 NOTE — Assessment & Plan Note (Signed)
takes Sertraline, Trazodone, Lorazepam

## 2021-05-30 NOTE — Assessment & Plan Note (Signed)
Hx of chronic bronchitis, lung cancer, takes Prednisone, Neb, O2, Tessalon, Morphine, Lorazepam.

## 2021-05-31 ENCOUNTER — Other Ambulatory Visit: Payer: Self-pay | Admitting: *Deleted

## 2021-05-31 NOTE — Telephone Encounter (Signed)
Mast, Man X, NP  You 52 minutes ago (10:23 AM)   The patient was expired. Thanks.

## 2021-05-31 NOTE — Telephone Encounter (Signed)
[place] Requested refill Pended Rx and sent to [facility] for approval.

## 2021-06-19 DEATH — deceased
